# Patient Record
Sex: Male | Born: 1985 | Race: Black or African American | Hispanic: No | Marital: Single | Smoking: Former smoker
Health system: Southern US, Community
[De-identification: ages and names within clinical notes are randomized; demographics above are authoritative.]

## PROBLEM LIST (undated history)

## (undated) DIAGNOSIS — I499 Cardiac arrhythmia, unspecified: Secondary | ICD-10-CM

## (undated) DIAGNOSIS — I071 Rheumatic tricuspid insufficiency: Secondary | ICD-10-CM

## (undated) DIAGNOSIS — I4892 Unspecified atrial flutter: Secondary | ICD-10-CM

## (undated) DIAGNOSIS — I5022 Chronic systolic (congestive) heart failure: Secondary | ICD-10-CM

## (undated) DIAGNOSIS — I340 Nonrheumatic mitral (valve) insufficiency: Secondary | ICD-10-CM

## (undated) DIAGNOSIS — M4690 Unspecified inflammatory spondylopathy, site unspecified: Secondary | ICD-10-CM

## (undated) DIAGNOSIS — D8989 Other specified disorders involving the immune mechanism, not elsewhere classified: Secondary | ICD-10-CM

## (undated) DIAGNOSIS — I509 Heart failure, unspecified: Secondary | ICD-10-CM

## (undated) DIAGNOSIS — I050 Rheumatic mitral stenosis: Secondary | ICD-10-CM

## (undated) DIAGNOSIS — L88 Pyoderma gangrenosum: Secondary | ICD-10-CM

## (undated) DIAGNOSIS — I219 Acute myocardial infarction, unspecified: Secondary | ICD-10-CM

## (undated) DIAGNOSIS — I82409 Acute embolism and thrombosis of unspecified deep veins of unspecified lower extremity: Secondary | ICD-10-CM

## (undated) DIAGNOSIS — D649 Anemia, unspecified: Secondary | ICD-10-CM

## (undated) DIAGNOSIS — L732 Hidradenitis suppurativa: Secondary | ICD-10-CM

## (undated) HISTORY — DX: Rheumatic mitral stenosis: I05.0

## (undated) HISTORY — PX: CARDIAC CATHETERIZATION: SHX172

## (undated) HISTORY — DX: Acute myocardial infarction, unspecified: I21.9

## (undated) HISTORY — DX: Chronic systolic (congestive) heart failure: I50.22

## (undated) HISTORY — DX: Unspecified inflammatory spondylopathy, site unspecified: M46.90

## (undated) HISTORY — DX: Nonrheumatic mitral (valve) insufficiency: I34.0

## (undated) HISTORY — DX: Anemia, unspecified: D64.9

## (undated) HISTORY — DX: Unspecified atrial flutter: I48.92

## (undated) HISTORY — DX: Acute embolism and thrombosis of unspecified deep veins of unspecified lower extremity: I82.409

## (undated) HISTORY — DX: Rheumatic tricuspid insufficiency: I07.1

## (undated) HISTORY — DX: Pyoderma gangrenosum: L88

## (undated) HISTORY — DX: Other specified disorders involving the immune mechanism, not elsewhere classified: D89.89

---

## 2008-10-01 ENCOUNTER — Emergency Department (HOSPITAL_COMMUNITY): Admission: EM | Admit: 2008-10-01 | Discharge: 2008-10-01 | Payer: Self-pay | Admitting: Emergency Medicine

## 2011-12-03 ENCOUNTER — Non-Acute Institutional Stay (HOSPITAL_COMMUNITY): Admission: AD | Admit: 2011-12-03 | Payer: Self-pay | Source: Home / Self Care | Admitting: Internal Medicine

## 2014-03-07 ENCOUNTER — Other Ambulatory Visit: Payer: Self-pay | Admitting: Rheumatology

## 2014-03-07 ENCOUNTER — Ambulatory Visit
Admission: RE | Admit: 2014-03-07 | Discharge: 2014-03-07 | Disposition: A | Discharge status: Home / Self Care | Payer: BC Managed Care – PPO | Source: Ambulatory Visit | Attending: Rheumatology | Admitting: Rheumatology

## 2014-03-07 ENCOUNTER — Encounter (INDEPENDENT_AMBULATORY_CARE_PROVIDER_SITE_OTHER): Payer: Self-pay

## 2014-03-07 DIAGNOSIS — M549 Dorsalgia, unspecified: Secondary | ICD-10-CM

## 2014-03-07 DIAGNOSIS — M25562 Pain in left knee: Secondary | ICD-10-CM

## 2014-08-11 ENCOUNTER — Encounter (HOSPITAL_COMMUNITY): Payer: Self-pay | Admitting: *Deleted

## 2014-08-11 ENCOUNTER — Emergency Department (HOSPITAL_COMMUNITY)
Admission: EM | Admit: 2014-08-11 | Discharge: 2014-08-11 | Disposition: A | Discharge status: Home / Self Care | Payer: Self-pay | Attending: Emergency Medicine | Admitting: Emergency Medicine

## 2014-08-11 DIAGNOSIS — Z4801 Encounter for change or removal of surgical wound dressing: Secondary | ICD-10-CM | POA: Insufficient documentation

## 2014-08-11 DIAGNOSIS — Z5189 Encounter for other specified aftercare: Secondary | ICD-10-CM

## 2014-08-11 DIAGNOSIS — Z87891 Personal history of nicotine dependence: Secondary | ICD-10-CM | POA: Insufficient documentation

## 2014-08-11 NOTE — ED Provider Notes (Signed)
CSN: [id number]     Arrival date & time 08/11/14  1519 History   First MD Initiated Contact with Patient 08/11/14 1626     Chief Complaint  Patient presents with  . Leg Pain  . Post-op Problem     (Consider location/radiation/quality/duration/timing/severity/associated sxs/prior Treatment) Patient is a 29 y.o. male presenting with leg pain.  Leg Pain Associated symptoms: no back pain and no fever    patient with pain in his wounds. Recently had infections and had to have surgical drainage at [facility]. Over the last 3 days he's had a change in the drainage. Has been a little bit more of it and it was time screen. Reportedly after the wounds were cleaned they began to good again. No fevers. He is on amoxicillin after culture.   History reviewed. No pertinent past medical history. History reviewed. No pertinent past surgical history. History reviewed. No pertinent family history. History  Substance Use Topics  . Smoking status: Former Games developer  . Smokeless tobacco: Not on file  . Alcohol Use: No    Review of Systems  Constitutional: Negative for fever and chills.  Respiratory: Negative for shortness of breath.   Cardiovascular: Positive for leg swelling.  Musculoskeletal: Negative for back pain.  Skin: Positive for color change and wound.  Hematological: Does not bruise/bleed easily.      Allergies  Review of patient's allergies indicates no known allergies.  Home Medications   Prior to Admission medications   Not on File   BP 159/88 mmHg  Pulse 57  Temp(Src) 98.3 F (36.8 C) (Oral)  Resp 18  Ht 5\' 10"  (1.778 m)  Wt 235 lb (106.595 kg)  BMI 33.72 kg/m2  SpO2 100% Physical Exam  Constitutional: He appears well-developed.  Eyes: Pupils are equal, round, and reactive to light.  Cardiovascular: Normal rate and regular rhythm.   Pulmonary/Chest: Effort normal.  Abdominal: Soft.  Musculoskeletal: Normal range of motion.  Neurological: He is alert.  Skin:  Skin is warm.   healing wounds to left lower leg. Right anterior lower leg and right posterior lower leg. All have some granulation tear shoe that also is one in his superior gluteal cleft.      ED Course  Procedures (including critical care time) Labs Review Labs Reviewed - No data to display  Imaging Review No results found.   EKG Interpretation None      MDM   Final diagnoses:  Wound check, abscess   patient with healing leg wounds. Does not appear to be a bad infection at this time. Artery on antibiotics. Appears to be well treated at this time. Has follow-up appointment on the second. Will discharge home.   Juliet Rude. Rubin Payor, MD 08/11/14 1729

## 2014-08-11 NOTE — Discharge Instructions (Signed)

## 2014-08-11 NOTE — ED Notes (Signed)
Pt reports having surgery earlier this month at [facility] for multiple boils/abscesses. Pt reports green drainage and swelling to right lower leg, denies fever.

## 2014-11-30 ENCOUNTER — Encounter (HOSPITAL_BASED_OUTPATIENT_CLINIC_OR_DEPARTMENT_OTHER): Payer: Self-pay | Attending: Internal Medicine

## 2014-11-30 DIAGNOSIS — L02415 Cutaneous abscess of right lower limb: Secondary | ICD-10-CM | POA: Insufficient documentation

## 2014-11-30 DIAGNOSIS — L97211 Non-pressure chronic ulcer of right calf limited to breakdown of skin: Secondary | ICD-10-CM | POA: Insufficient documentation

## 2014-11-30 DIAGNOSIS — L0201 Cutaneous abscess of face: Secondary | ICD-10-CM | POA: Insufficient documentation

## 2014-11-30 DIAGNOSIS — L97221 Non-pressure chronic ulcer of left calf limited to breakdown of skin: Secondary | ICD-10-CM | POA: Insufficient documentation

## 2014-12-19 ENCOUNTER — Encounter (HOSPITAL_BASED_OUTPATIENT_CLINIC_OR_DEPARTMENT_OTHER): Payer: Self-pay | Attending: Internal Medicine

## 2014-12-19 DIAGNOSIS — L97211 Non-pressure chronic ulcer of right calf limited to breakdown of skin: Secondary | ICD-10-CM | POA: Insufficient documentation

## 2014-12-19 DIAGNOSIS — L02415 Cutaneous abscess of right lower limb: Secondary | ICD-10-CM | POA: Insufficient documentation

## 2014-12-19 DIAGNOSIS — L0201 Cutaneous abscess of face: Secondary | ICD-10-CM | POA: Insufficient documentation

## 2014-12-19 DIAGNOSIS — L97221 Non-pressure chronic ulcer of left calf limited to breakdown of skin: Secondary | ICD-10-CM | POA: Insufficient documentation

## 2015-01-09 ENCOUNTER — Other Ambulatory Visit (HOSPITAL_COMMUNITY)
Admission: RE | Admit: 2015-01-09 | Discharge: 2015-01-09 | Disposition: A | Discharge status: Home / Self Care | Payer: Self-pay | Source: Ambulatory Visit | Attending: General Surgery | Admitting: General Surgery

## 2015-01-09 DIAGNOSIS — L97221 Non-pressure chronic ulcer of left calf limited to breakdown of skin: Secondary | ICD-10-CM | POA: Insufficient documentation

## 2015-01-09 DIAGNOSIS — L97211 Non-pressure chronic ulcer of right calf limited to breakdown of skin: Secondary | ICD-10-CM | POA: Insufficient documentation

## 2015-01-09 DIAGNOSIS — L02415 Cutaneous abscess of right lower limb: Secondary | ICD-10-CM | POA: Insufficient documentation

## 2015-01-09 LAB — CBC WITH DIFFERENTIAL/PLATELET
Basophils Absolute: 0 10*3/uL (ref 0.0–0.1)
Basophils Relative: 0 % (ref 0–1)
EOS PCT: 0 % (ref 0–5)
Eosinophils Absolute: 0 10*3/uL (ref 0.0–0.7)
HCT: 30.2 % — ABNORMAL LOW (ref 39.0–52.0)
HEMOGLOBIN: 9.4 g/dL — AB (ref 13.0–17.0)
LYMPHS PCT: 33 % (ref 12–46)
Lymphs Abs: 3 10*3/uL (ref 0.7–4.0)
MCH: 23.4 pg — AB (ref 26.0–34.0)
MCHC: 31.1 g/dL (ref 30.0–36.0)
MCV: 75.3 fL — AB (ref 78.0–100.0)
MONO ABS: 0.3 10*3/uL (ref 0.1–1.0)
Monocytes Relative: 3 % (ref 3–12)
NEUTROS PCT: 64 % (ref 43–77)
Neutro Abs: 5.8 10*3/uL (ref 1.7–7.7)
PLATELETS: 634 10*3/uL — AB (ref 150–400)
RBC: 4.01 MIL/uL — AB (ref 4.22–5.81)
RDW: 20 % — ABNORMAL HIGH (ref 11.5–15.5)
WBC: 9.2 10*3/uL (ref 4.0–10.5)

## 2015-01-09 LAB — COMPREHENSIVE METABOLIC PANEL
ALT: 16 U/L — AB (ref 17–63)
AST: 33 U/L (ref 15–41)
Albumin: 2.9 g/dL — ABNORMAL LOW (ref 3.5–5.0)
Alkaline Phosphatase: 95 U/L (ref 38–126)
Anion gap: 6 (ref 5–15)
BUN: 10 mg/dL (ref 6–20)
CALCIUM: 8.7 mg/dL — AB (ref 8.9–10.3)
CHLORIDE: 101 mmol/L (ref 101–111)
CO2: 28 mmol/L (ref 22–32)
Creatinine, Ser: 0.75 mg/dL (ref 0.61–1.24)
GLUCOSE: 99 mg/dL (ref 65–99)
POTASSIUM: 3.7 mmol/L (ref 3.5–5.1)
Sodium: 135 mmol/L (ref 135–145)
Total Bilirubin: 0.3 mg/dL (ref 0.3–1.2)
Total Protein: 9.5 g/dL — ABNORMAL HIGH (ref 6.5–8.1)

## 2015-01-09 LAB — C-REACTIVE PROTEIN: CRP: 11.9 mg/dL — AB (ref <1.0)

## 2015-01-09 LAB — SEDIMENTATION RATE: Sed Rate: 103 mm/hr — ABNORMAL HIGH (ref 0–16)

## 2015-01-10 LAB — C4 COMPLEMENT: COMPLEMENT C4, BODY FLUID: 17 mg/dL (ref 14–44)

## 2015-01-10 LAB — ANA W/REFLEX: ANA: NEGATIVE

## 2015-01-10 LAB — C3 COMPLEMENT: C3 Complement: 170 mg/dL — ABNORMAL HIGH (ref 82–167)

## 2015-01-10 LAB — ANTI-DNA ANTIBODY, DOUBLE-STRANDED

## 2015-01-10 LAB — RHEUMATOID FACTOR: RHEUMATOID FACTOR: 22.1 [IU]/mL — AB (ref 0.0–13.9)

## 2015-01-22 ENCOUNTER — Encounter (HOSPITAL_BASED_OUTPATIENT_CLINIC_OR_DEPARTMENT_OTHER): Payer: Self-pay | Attending: Plastic Surgery

## 2015-01-22 DIAGNOSIS — L97811 Non-pressure chronic ulcer of other part of right lower leg limited to breakdown of skin: Secondary | ICD-10-CM | POA: Insufficient documentation

## 2015-01-22 DIAGNOSIS — L97821 Non-pressure chronic ulcer of other part of left lower leg limited to breakdown of skin: Secondary | ICD-10-CM | POA: Insufficient documentation

## 2015-01-29 ENCOUNTER — Other Ambulatory Visit: Payer: Self-pay | Admitting: Plastic Surgery

## 2015-01-30 ENCOUNTER — Telehealth (HOSPITAL_BASED_OUTPATIENT_CLINIC_OR_DEPARTMENT_OTHER): Payer: Self-pay | Admitting: *Deleted

## 2015-01-31 ENCOUNTER — Other Ambulatory Visit: Payer: Self-pay | Admitting: Plastic Surgery

## 2015-03-05 ENCOUNTER — Encounter (HOSPITAL_BASED_OUTPATIENT_CLINIC_OR_DEPARTMENT_OTHER): Payer: Self-pay | Attending: Plastic Surgery

## 2016-06-16 HISTORY — PX: HERNIA REPAIR: SHX51

## 2018-06-06 DIAGNOSIS — I469 Cardiac arrest, cause unspecified: Secondary | ICD-10-CM | POA: Diagnosis not present

## 2018-06-06 DIAGNOSIS — I512 Rupture of papillary muscle, not elsewhere classified: Secondary | ICD-10-CM | POA: Diagnosis not present

## 2018-06-06 DIAGNOSIS — J9601 Acute respiratory failure with hypoxia: Secondary | ICD-10-CM | POA: Diagnosis not present

## 2018-06-06 DIAGNOSIS — R042 Hemoptysis: Secondary | ICD-10-CM | POA: Diagnosis not present

## 2018-06-06 DIAGNOSIS — R634 Abnormal weight loss: Secondary | ICD-10-CM | POA: Diagnosis not present

## 2018-06-06 DIAGNOSIS — R918 Other nonspecific abnormal finding of lung field: Secondary | ICD-10-CM | POA: Diagnosis not present

## 2018-06-06 DIAGNOSIS — L97911 Non-pressure chronic ulcer of unspecified part of right lower leg limited to breakdown of skin: Secondary | ICD-10-CM | POA: Diagnosis not present

## 2018-06-06 DIAGNOSIS — I361 Nonrheumatic tricuspid (valve) insufficiency: Secondary | ICD-10-CM | POA: Diagnosis not present

## 2018-06-06 DIAGNOSIS — Z4682 Encounter for fitting and adjustment of non-vascular catheter: Secondary | ICD-10-CM | POA: Diagnosis not present

## 2018-06-06 DIAGNOSIS — I959 Hypotension, unspecified: Secondary | ICD-10-CM | POA: Diagnosis not present

## 2018-06-06 DIAGNOSIS — L88 Pyoderma gangrenosum: Secondary | ICD-10-CM | POA: Diagnosis not present

## 2018-06-06 DIAGNOSIS — J939 Pneumothorax, unspecified: Secondary | ICD-10-CM | POA: Diagnosis not present

## 2018-06-06 DIAGNOSIS — E861 Hypovolemia: Secondary | ICD-10-CM | POA: Diagnosis not present

## 2018-06-06 DIAGNOSIS — R0603 Acute respiratory distress: Secondary | ICD-10-CM | POA: Diagnosis not present

## 2018-06-06 DIAGNOSIS — R160 Hepatomegaly, not elsewhere classified: Secondary | ICD-10-CM | POA: Diagnosis not present

## 2018-06-06 DIAGNOSIS — R000 Tachycardia, unspecified: Secondary | ICD-10-CM | POA: Diagnosis not present

## 2018-06-06 DIAGNOSIS — J9690 Respiratory failure, unspecified, unspecified whether with hypoxia or hypercapnia: Secondary | ICD-10-CM | POA: Diagnosis not present

## 2018-06-06 DIAGNOSIS — F1210 Cannabis abuse, uncomplicated: Secondary | ICD-10-CM | POA: Diagnosis not present

## 2018-06-06 DIAGNOSIS — J810 Acute pulmonary edema: Secondary | ICD-10-CM | POA: Diagnosis not present

## 2018-06-06 DIAGNOSIS — J9600 Acute respiratory failure, unspecified whether with hypoxia or hypercapnia: Secondary | ICD-10-CM | POA: Diagnosis not present

## 2018-06-06 DIAGNOSIS — I358 Other nonrheumatic aortic valve disorders: Secondary | ICD-10-CM | POA: Diagnosis not present

## 2018-06-06 DIAGNOSIS — I9589 Other hypotension: Secondary | ICD-10-CM | POA: Diagnosis not present

## 2018-06-06 DIAGNOSIS — F17210 Nicotine dependence, cigarettes, uncomplicated: Secondary | ICD-10-CM | POA: Diagnosis not present

## 2018-06-06 DIAGNOSIS — R9431 Abnormal electrocardiogram [ECG] [EKG]: Secondary | ICD-10-CM | POA: Diagnosis not present

## 2018-06-06 DIAGNOSIS — L97921 Non-pressure chronic ulcer of unspecified part of left lower leg limited to breakdown of skin: Secondary | ICD-10-CM | POA: Diagnosis not present

## 2018-06-06 DIAGNOSIS — J90 Pleural effusion, not elsewhere classified: Secondary | ICD-10-CM | POA: Diagnosis not present

## 2018-06-06 DIAGNOSIS — R4182 Altered mental status, unspecified: Secondary | ICD-10-CM | POA: Diagnosis not present

## 2018-06-06 DIAGNOSIS — I340 Nonrheumatic mitral (valve) insufficiency: Secondary | ICD-10-CM | POA: Diagnosis not present

## 2018-06-06 DIAGNOSIS — Z954 Presence of other heart-valve replacement: Secondary | ICD-10-CM | POA: Diagnosis not present

## 2018-06-06 DIAGNOSIS — I517 Cardiomegaly: Secondary | ICD-10-CM | POA: Diagnosis not present

## 2018-06-06 DIAGNOSIS — D62 Acute posthemorrhagic anemia: Secondary | ICD-10-CM | POA: Diagnosis not present

## 2018-06-06 DIAGNOSIS — G8918 Other acute postprocedural pain: Secondary | ICD-10-CM | POA: Diagnosis not present

## 2018-06-06 DIAGNOSIS — R162 Hepatomegaly with splenomegaly, not elsewhere classified: Secondary | ICD-10-CM | POA: Diagnosis not present

## 2018-06-06 DIAGNOSIS — J95821 Acute postprocedural respiratory failure: Secondary | ICD-10-CM | POA: Diagnosis not present

## 2018-06-07 DIAGNOSIS — I340 Nonrheumatic mitral (valve) insufficiency: Secondary | ICD-10-CM | POA: Diagnosis not present

## 2018-06-07 DIAGNOSIS — Z954 Presence of other heart-valve replacement: Secondary | ICD-10-CM | POA: Diagnosis not present

## 2018-06-07 HISTORY — PX: MITRAL VALVE REPAIR: SHX2039

## 2018-06-08 DIAGNOSIS — Z954 Presence of other heart-valve replacement: Secondary | ICD-10-CM | POA: Diagnosis not present

## 2018-06-08 DIAGNOSIS — J939 Pneumothorax, unspecified: Secondary | ICD-10-CM | POA: Diagnosis not present

## 2018-06-08 DIAGNOSIS — I340 Nonrheumatic mitral (valve) insufficiency: Secondary | ICD-10-CM | POA: Diagnosis not present

## 2018-06-08 DIAGNOSIS — J90 Pleural effusion, not elsewhere classified: Secondary | ICD-10-CM | POA: Diagnosis not present

## 2018-06-10 DIAGNOSIS — J90 Pleural effusion, not elsewhere classified: Secondary | ICD-10-CM | POA: Diagnosis not present

## 2018-06-10 DIAGNOSIS — I340 Nonrheumatic mitral (valve) insufficiency: Secondary | ICD-10-CM | POA: Diagnosis not present

## 2018-06-10 DIAGNOSIS — J939 Pneumothorax, unspecified: Secondary | ICD-10-CM | POA: Diagnosis not present

## 2018-06-11 DIAGNOSIS — I340 Nonrheumatic mitral (valve) insufficiency: Secondary | ICD-10-CM | POA: Diagnosis not present

## 2018-06-17 DIAGNOSIS — D72829 Elevated white blood cell count, unspecified: Secondary | ICD-10-CM | POA: Diagnosis not present

## 2018-06-17 DIAGNOSIS — T8131XA Disruption of external operation (surgical) wound, not elsewhere classified, initial encounter: Secondary | ICD-10-CM | POA: Diagnosis not present

## 2018-06-17 DIAGNOSIS — L732 Hidradenitis suppurativa: Secondary | ICD-10-CM | POA: Diagnosis not present

## 2018-06-17 DIAGNOSIS — E43 Unspecified severe protein-calorie malnutrition: Secondary | ICD-10-CM | POA: Diagnosis not present

## 2018-06-17 DIAGNOSIS — R0789 Other chest pain: Secondary | ICD-10-CM | POA: Diagnosis not present

## 2018-06-17 DIAGNOSIS — L89159 Pressure ulcer of sacral region, unspecified stage: Secondary | ICD-10-CM | POA: Diagnosis not present

## 2018-06-17 DIAGNOSIS — Z87891 Personal history of nicotine dependence: Secondary | ICD-10-CM | POA: Diagnosis not present

## 2018-06-17 DIAGNOSIS — I808 Phlebitis and thrombophlebitis of other sites: Secondary | ICD-10-CM | POA: Diagnosis not present

## 2018-06-17 DIAGNOSIS — D62 Acute posthemorrhagic anemia: Secondary | ICD-10-CM | POA: Diagnosis not present

## 2018-06-17 DIAGNOSIS — R600 Localized edema: Secondary | ICD-10-CM | POA: Diagnosis not present

## 2018-06-17 DIAGNOSIS — I517 Cardiomegaly: Secondary | ICD-10-CM | POA: Diagnosis not present

## 2018-06-17 DIAGNOSIS — L97921 Non-pressure chronic ulcer of unspecified part of left lower leg limited to breakdown of skin: Secondary | ICD-10-CM | POA: Diagnosis not present

## 2018-06-17 DIAGNOSIS — I82622 Acute embolism and thrombosis of deep veins of left upper extremity: Secondary | ICD-10-CM | POA: Diagnosis not present

## 2018-06-17 DIAGNOSIS — I340 Nonrheumatic mitral (valve) insufficiency: Secondary | ICD-10-CM | POA: Diagnosis not present

## 2018-06-17 DIAGNOSIS — R9431 Abnormal electrocardiogram [ECG] [EKG]: Secondary | ICD-10-CM | POA: Diagnosis not present

## 2018-06-17 DIAGNOSIS — I319 Disease of pericardium, unspecified: Secondary | ICD-10-CM | POA: Diagnosis not present

## 2018-06-17 DIAGNOSIS — L88 Pyoderma gangrenosum: Secondary | ICD-10-CM | POA: Diagnosis not present

## 2018-06-17 DIAGNOSIS — Y929 Unspecified place or not applicable: Secondary | ICD-10-CM | POA: Diagnosis not present

## 2018-06-18 DIAGNOSIS — R600 Localized edema: Secondary | ICD-10-CM | POA: Diagnosis not present

## 2018-06-18 DIAGNOSIS — Z87891 Personal history of nicotine dependence: Secondary | ICD-10-CM | POA: Diagnosis not present

## 2018-06-18 DIAGNOSIS — I82622 Acute embolism and thrombosis of deep veins of left upper extremity: Secondary | ICD-10-CM | POA: Diagnosis not present

## 2018-06-18 DIAGNOSIS — R0789 Other chest pain: Secondary | ICD-10-CM | POA: Diagnosis not present

## 2018-06-18 DIAGNOSIS — I808 Phlebitis and thrombophlebitis of other sites: Secondary | ICD-10-CM | POA: Diagnosis not present

## 2018-06-18 DIAGNOSIS — L97921 Non-pressure chronic ulcer of unspecified part of left lower leg limited to breakdown of skin: Secondary | ICD-10-CM | POA: Diagnosis not present

## 2018-07-01 DIAGNOSIS — J9811 Atelectasis: Secondary | ICD-10-CM | POA: Diagnosis not present

## 2018-07-01 DIAGNOSIS — Z952 Presence of prosthetic heart valve: Secondary | ICD-10-CM | POA: Diagnosis not present

## 2018-07-01 DIAGNOSIS — L88 Pyoderma gangrenosum: Secondary | ICD-10-CM | POA: Diagnosis not present

## 2018-07-01 DIAGNOSIS — R0602 Shortness of breath: Secondary | ICD-10-CM | POA: Diagnosis not present

## 2018-07-01 DIAGNOSIS — Z5181 Encounter for therapeutic drug level monitoring: Secondary | ICD-10-CM | POA: Diagnosis not present

## 2018-07-01 DIAGNOSIS — I517 Cardiomegaly: Secondary | ICD-10-CM | POA: Diagnosis not present

## 2018-07-01 DIAGNOSIS — I340 Nonrheumatic mitral (valve) insufficiency: Secondary | ICD-10-CM | POA: Diagnosis not present

## 2018-07-01 DIAGNOSIS — R079 Chest pain, unspecified: Secondary | ICD-10-CM | POA: Diagnosis not present

## 2018-07-06 DIAGNOSIS — L88 Pyoderma gangrenosum: Secondary | ICD-10-CM | POA: Diagnosis not present

## 2018-07-06 DIAGNOSIS — L732 Hidradenitis suppurativa: Secondary | ICD-10-CM | POA: Diagnosis not present

## 2018-07-13 DIAGNOSIS — L88 Pyoderma gangrenosum: Secondary | ICD-10-CM | POA: Diagnosis not present

## 2018-07-20 DIAGNOSIS — Z5181 Encounter for therapeutic drug level monitoring: Secondary | ICD-10-CM | POA: Diagnosis not present

## 2018-07-20 DIAGNOSIS — L732 Hidradenitis suppurativa: Secondary | ICD-10-CM | POA: Diagnosis not present

## 2018-07-20 DIAGNOSIS — L88 Pyoderma gangrenosum: Secondary | ICD-10-CM | POA: Diagnosis not present

## 2018-07-20 DIAGNOSIS — L701 Acne conglobata: Secondary | ICD-10-CM | POA: Diagnosis not present

## 2018-07-26 DIAGNOSIS — R1011 Right upper quadrant pain: Secondary | ICD-10-CM | POA: Diagnosis not present

## 2018-07-26 DIAGNOSIS — R7989 Other specified abnormal findings of blood chemistry: Secondary | ICD-10-CM | POA: Diagnosis not present

## 2018-07-26 DIAGNOSIS — J984 Other disorders of lung: Secondary | ICD-10-CM | POA: Diagnosis not present

## 2018-07-26 DIAGNOSIS — R0789 Other chest pain: Secondary | ICD-10-CM | POA: Diagnosis not present

## 2018-07-26 DIAGNOSIS — R1013 Epigastric pain: Secondary | ICD-10-CM | POA: Diagnosis not present

## 2018-07-27 DIAGNOSIS — I5020 Unspecified systolic (congestive) heart failure: Secondary | ICD-10-CM | POA: Diagnosis not present

## 2018-07-27 DIAGNOSIS — L732 Hidradenitis suppurativa: Secondary | ICD-10-CM | POA: Diagnosis not present

## 2018-07-27 DIAGNOSIS — K6289 Other specified diseases of anus and rectum: Secondary | ICD-10-CM | POA: Diagnosis not present

## 2018-07-27 DIAGNOSIS — K648 Other hemorrhoids: Secondary | ICD-10-CM | POA: Diagnosis not present

## 2018-07-27 DIAGNOSIS — R079 Chest pain, unspecified: Secondary | ICD-10-CM | POA: Diagnosis not present

## 2018-07-27 DIAGNOSIS — R109 Unspecified abdominal pain: Secondary | ICD-10-CM | POA: Diagnosis not present

## 2018-07-27 DIAGNOSIS — R1011 Right upper quadrant pain: Secondary | ICD-10-CM | POA: Diagnosis not present

## 2018-07-27 DIAGNOSIS — R000 Tachycardia, unspecified: Secondary | ICD-10-CM | POA: Diagnosis not present

## 2018-07-27 DIAGNOSIS — R935 Abnormal findings on diagnostic imaging of other abdominal regions, including retroperitoneum: Secondary | ICD-10-CM | POA: Diagnosis not present

## 2018-07-27 DIAGNOSIS — R0609 Other forms of dyspnea: Secondary | ICD-10-CM | POA: Diagnosis not present

## 2018-07-27 DIAGNOSIS — Z8674 Personal history of sudden cardiac arrest: Secondary | ICD-10-CM | POA: Diagnosis not present

## 2018-07-27 DIAGNOSIS — I517 Cardiomegaly: Secondary | ICD-10-CM | POA: Diagnosis not present

## 2018-07-27 DIAGNOSIS — I252 Old myocardial infarction: Secondary | ICD-10-CM | POA: Diagnosis not present

## 2018-07-27 DIAGNOSIS — R1013 Epigastric pain: Secondary | ICD-10-CM | POA: Diagnosis not present

## 2018-07-27 DIAGNOSIS — D509 Iron deficiency anemia, unspecified: Secondary | ICD-10-CM | POA: Diagnosis not present

## 2018-07-27 DIAGNOSIS — Z95818 Presence of other cardiac implants and grafts: Secondary | ICD-10-CM | POA: Diagnosis not present

## 2018-07-27 DIAGNOSIS — J90 Pleural effusion, not elsewhere classified: Secondary | ICD-10-CM | POA: Diagnosis not present

## 2018-07-27 DIAGNOSIS — I82622 Acute embolism and thrombosis of deep veins of left upper extremity: Secondary | ICD-10-CM | POA: Diagnosis not present

## 2018-07-27 DIAGNOSIS — R0602 Shortness of breath: Secondary | ICD-10-CM | POA: Diagnosis not present

## 2018-07-27 DIAGNOSIS — I340 Nonrheumatic mitral (valve) insufficiency: Secondary | ICD-10-CM | POA: Diagnosis not present

## 2018-07-27 DIAGNOSIS — I509 Heart failure, unspecified: Secondary | ICD-10-CM | POA: Diagnosis not present

## 2018-07-27 DIAGNOSIS — K644 Residual hemorrhoidal skin tags: Secondary | ICD-10-CM | POA: Diagnosis not present

## 2018-07-27 DIAGNOSIS — L88 Pyoderma gangrenosum: Secondary | ICD-10-CM | POA: Diagnosis not present

## 2018-07-27 DIAGNOSIS — I5023 Acute on chronic systolic (congestive) heart failure: Secondary | ICD-10-CM | POA: Diagnosis not present

## 2018-07-27 DIAGNOSIS — M47899 Other spondylosis, site unspecified: Secondary | ICD-10-CM | POA: Diagnosis not present

## 2018-07-27 DIAGNOSIS — R011 Cardiac murmur, unspecified: Secondary | ICD-10-CM | POA: Diagnosis not present

## 2018-07-27 DIAGNOSIS — R0789 Other chest pain: Secondary | ICD-10-CM | POA: Diagnosis not present

## 2018-07-27 DIAGNOSIS — R112 Nausea with vomiting, unspecified: Secondary | ICD-10-CM | POA: Diagnosis not present

## 2018-07-27 DIAGNOSIS — J811 Chronic pulmonary edema: Secondary | ICD-10-CM | POA: Diagnosis not present

## 2018-07-27 DIAGNOSIS — Z952 Presence of prosthetic heart valve: Secondary | ICD-10-CM | POA: Diagnosis not present

## 2018-07-27 DIAGNOSIS — R7989 Other specified abnormal findings of blood chemistry: Secondary | ICD-10-CM | POA: Diagnosis not present

## 2018-07-27 DIAGNOSIS — D599 Acquired hemolytic anemia, unspecified: Secondary | ICD-10-CM | POA: Diagnosis not present

## 2018-07-27 DIAGNOSIS — Z87891 Personal history of nicotine dependence: Secondary | ICD-10-CM | POA: Diagnosis not present

## 2018-07-27 DIAGNOSIS — R072 Precordial pain: Secondary | ICD-10-CM | POA: Diagnosis not present

## 2018-07-27 DIAGNOSIS — I5189 Other ill-defined heart diseases: Secondary | ICD-10-CM | POA: Diagnosis not present

## 2018-07-27 DIAGNOSIS — I5021 Acute systolic (congestive) heart failure: Secondary | ICD-10-CM | POA: Diagnosis not present

## 2018-07-27 DIAGNOSIS — I808 Phlebitis and thrombophlebitis of other sites: Secondary | ICD-10-CM | POA: Diagnosis not present

## 2018-07-27 DIAGNOSIS — I080 Rheumatic disorders of both mitral and aortic valves: Secondary | ICD-10-CM | POA: Diagnosis not present

## 2018-07-27 DIAGNOSIS — J984 Other disorders of lung: Secondary | ICD-10-CM | POA: Diagnosis not present

## 2018-07-27 DIAGNOSIS — M461 Sacroiliitis, not elsewhere classified: Secondary | ICD-10-CM | POA: Diagnosis not present

## 2018-07-27 DIAGNOSIS — I429 Cardiomyopathy, unspecified: Secondary | ICD-10-CM | POA: Diagnosis not present

## 2018-07-27 DIAGNOSIS — R9431 Abnormal electrocardiogram [ECG] [EKG]: Secondary | ICD-10-CM | POA: Diagnosis not present

## 2018-07-27 DIAGNOSIS — E8809 Other disorders of plasma-protein metabolism, not elsewhere classified: Secondary | ICD-10-CM | POA: Diagnosis not present

## 2018-07-27 DIAGNOSIS — K5289 Other specified noninfective gastroenteritis and colitis: Secondary | ICD-10-CM | POA: Diagnosis not present

## 2018-07-27 DIAGNOSIS — Z8679 Personal history of other diseases of the circulatory system: Secondary | ICD-10-CM | POA: Diagnosis not present

## 2018-07-27 DIAGNOSIS — Z7901 Long term (current) use of anticoagulants: Secondary | ICD-10-CM | POA: Diagnosis not present

## 2018-07-27 DIAGNOSIS — R1031 Right lower quadrant pain: Secondary | ICD-10-CM | POA: Diagnosis not present

## 2018-08-05 DIAGNOSIS — R079 Chest pain, unspecified: Secondary | ICD-10-CM | POA: Diagnosis not present

## 2018-08-11 ENCOUNTER — Ambulatory Visit (INDEPENDENT_AMBULATORY_CARE_PROVIDER_SITE_OTHER): Payer: BLUE CROSS/BLUE SHIELD | Admitting: Cardiology

## 2018-08-11 ENCOUNTER — Encounter: Payer: Self-pay | Admitting: Cardiology

## 2018-08-11 VITALS — BP 114/79 | HR 68 | Ht 70.0 in | Wt 180.6 lb

## 2018-08-11 DIAGNOSIS — I5022 Chronic systolic (congestive) heart failure: Secondary | ICD-10-CM | POA: Diagnosis not present

## 2018-08-11 DIAGNOSIS — I340 Nonrheumatic mitral (valve) insufficiency: Secondary | ICD-10-CM | POA: Diagnosis not present

## 2018-08-11 MED ORDER — LISINOPRIL 10 MG PO TABS: 10.0000 mg | ORAL_TABLET | Freq: Every day | ORAL | 1 refills | Status: DC

## 2018-08-11 NOTE — Patient Instructions (Signed)
Your physician recommends that you schedule a follow-up appointment in: 1 MONTH WITH DR [facility]  Your physician has recommended you make the following change in your medication:   INCREASE LISINOPRIL 10 MG DAILY  Your physician recommends that you return for lab work in: 2 WEEKS - BMP   Thank you for choosing [facility]!!

## 2018-08-11 NOTE — Progress Notes (Signed)
Clinical Summary Donald [name] is a 33 y.o.male seen today as a new patient for the following medical problems.   1. Mitral regurgitation - admit 05/2018 with acute respiratory failure. From notes had cardaic arrest with induction for intubation.  - TTE done showed severe MR - TEE showed severe prolapse and possible flail A3 scallop with severe MR.   - 06/07/2018 went to OR for MV repair with resection of ruptured anterior papillary muscle, reconstruction of papillary chord with two Neo-chords and placement of Simplici-T Annuloplasty ring via superior septal approach - Intraop/Postop TEE reports no MR initially after surgery   07/2018 TEE during repeat admission mobile echodensity posterior leaflet, possible surgical suture, vs torn chordae vs vegetation. The MR is not reported regardign severity, ERO 0.93 would suggest severe, unclear  - CT surgery at that time recommended continued medical therapy, reconsider repeat intervention at ouptatient followup - thought perhaps an autoimmune disease may have causes some myxomatous degeneration. Undergoing rheum evaluation  -Has f/u 08/12/18 with [facility] CT surgery   - no SOB or DOE. No recent edema - compliant with meds.  2. Chronic systolic HF - readmitted 07/2018 with volume overload - multiple TTE's and TEEs as reported below. Last LVEF 35-40% by TTE in 07/2018, LVIDd 5.6 and LVIDs 4.3, mild LAE.   - discharged on Lasix  daily, lisinopril , TOprol , ASA 81, atorva 10,  - 08/03/18 labs Cr 0.81 K 3.9  - home weights stable 176 lbs. Limiting sodium intake.    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   4. 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   No Known Allergies   Current Outpatient Medications  Medication Sig Dispense Refill  . Adalimumab 40 MG/0.4ML PNKT Inject into the skin every 14 (fourteen) days.     . ASPIRIN ADULT LOW STRENGTH 81 MG EC tablet Take 81 mg by mouth daily.     Marland Kitchen atorvastatin (LIPITOR) 10 MG tablet Take 10 mg by mouth daily.     . clopidogrel (PLAVIX) 75 MG tablet Take 75 mg by mouth daily.    . ferrous sulfate 325 (65 FE) MG tablet Take 325 mg by mouth daily.     . folic acid (FOLVITE) 1 MG tablet Take 1 mg by mouth daily.     . furosemide (LASIX) 40 MG tablet Take 40 mg by mouth daily.     . metoprolol succinate (TOPROL-XL) 25 MG 24 hr tablet Take 25 mg by mouth daily.     . predniSONE (DELTASONE) 20 MG tablet TAKE 1 TABLET BY MOUTH ONCE DAILY FOR 5 DAYS THEN TAKE 1 2 (ONE HALF) TABLET ONCE DAILY FOR 5 DAYS    . rivaroxaban (XARELTO) 20 MG TABS tablet Take 20 mg by mouth daily with supper.     . vitamin C (ASCORBIC ACID) 250 MG tablet Take 250 mg by mouth 2 (two) times daily.     Marland Kitchen lisinopril (PRINIVIL,ZESTRIL) 10 MG tablet Take 1 tablet (10 mg total) by mouth daily. 90 tablet 1   No current facility-administered medications for this visit.  No past surgical history on file.   No Known Allergies    No family history on file.   Social History Donald [name] reports that he has quit smoking. He has never used smokeless tobacco. Donald [name] reports no history of alcohol use.   Review of Systems CONSTITUTIONAL: No weight loss, fever, chills, weakness or fatigue.  HEENT: Eyes: No visual loss, blurred vision, double vision or yellow sclerae.No hearing loss, sneezing, congestion, runny nose or sore throat.  SKIN: No rash or itching.  CARDIOVASCULAR: per hpi RESPIRATORY: No shortness of breath, cough or sputum.  GASTROINTESTINAL: No anorexia, nausea, vomiting or diarrhea. No abdominal pain or blood.  GENITOURINARY: No burning on  urination, no polyuria NEUROLOGICAL: No headache, dizziness, syncope, paralysis, ataxia, numbness or tingling in the extremities. No change in bowel or bladder control.  MUSCULOSKELETAL: No muscle, back pain, joint pain or stiffness.  LYMPHATICS: No enlarged nodes. No history of splenectomy.  PSYCHIATRIC: No history of depression or anxiety.  ENDOCRINOLOGIC: No reports of sweating, cold or heat intolerance. No polyuria or polydipsia.  Marland Kitchen   Physical Examination Vitals:   08/11/18 1110 08/11/18 1118  BP: 126/85 114/79  Pulse: 87 68  SpO2: 100%    Filed Weights   08/11/18 1110  Weight: 180 lb 9.6 oz (81.9 kg)    Gen: resting comfortably, no acute distress HEENT: no scleral icterus, pupils equal round and reactive, no palptable cervical adenopathy,  CV: RRR, 3/6 systolic murmur apex Resp: Clear to auscultation bilaterally GI: abdomen is soft, non-tender, non-distended, normal bowel sounds, no hepatosplenomegaly MSK: extremities are warm, no edema.  Skin: warm, no rash Neuro:  no focal deficits Psych: appropriate affect   Diagnostic Studies  06/06/18 Echo [facility] SUMMARY Mild left ventricular hypertrophy The left ventricle is mildly dilated. The apex is contracting normally. The rest of LV wall is hypokinetic. LV ejection fraction = 35-40%.  Left ventricular systolic function is moderately reduced. The right ventricle is normal in size and function. There is severe mitral regurgitation. There is mild mitral valve thickening. Suspect anterior mitral valve prolapse. Recommend TEE for further evaluation. Normal IVC size with decreased respiratory collapse. There is no pericardial effusion. There is no comparison study available.   06/06/2018 TEE [facility] SUMMARY There is severe prolapse (possible flail) of A3 scallop resulting in severe,  posteriorly directed regurgitation with a 0.5 cm gap in closure. Pulmonary  vein flow shows systolic reversal. Papillary muscles appear intact.  No  vegetations seen. The left ventricular size is normal.  Left ventricular systolic function is mildly reduced. The right ventricle is normal in size and function. Diffuse thickening of the aortic valve with preserved cusp opening. There is  an extremely small ( <2 mm) echodensity on the ventricular side of the aortic  valve--no significant aortic regurgitation; recommend clinical correlation. There is mild tricuspid regurgitation. No overt vegetations identified. 3D Echocardiography was performed and reviewed for assessment of cardiac  structure and function using a workstation. Findings were integrated into the  echo report.  06/07/18 Intraop TEE Pre-Intervention Summary An Omniplane TEE probe was inserted orally. A full TEE exam was performed  including 2D, M-Mode, Color and Doppler. The TEE probe was placed  atraumatically on the first attempt. The left ventricle is severely dilated. There is normal left ventricular wall  thickness. Left ventricular systolic function is moderately reduced. The EF  is 30-35%. There is mild to moderate global hypokinesis of the left  ventricle. There is moderate to severe inferior wall hypokinesis. The  right ventricle is moderate to severely dilated. The right ventricular  systolic function is mildly reduced. The interatrial septum is intact with no evidence for an atrial septal  defect. The left atrium is severely dilated. Right atrial size is normal. There is a ruptured chordae with flail of the A3 segment of the anterior  mitral leaflet. No significant mitral valve stenosis. There is severe mitral  regurgitation. Flow reversal noted in pulmonary veins consistent with  significant mitral regurgitation. The mitral regurgitant jet is posteriorly  directed, which is consistent with anterior leaflet pathology. The tricuspid valve is normal in structure and function. There is trace  tricuspid regurgitation. The aortic valve is trileaflet. The  aortic valve opens well. There is a small  irregularity on the left coronary cusp adjacent to the right coronary cusp.  Cannot exclude aortic valvular vegetation. No hemodynamically significant  valvular aortic stenosis. Trace to mild AI with central jet. The pulmonic valve is normal in structure and function. > The aortic root is normal size. No obvious dissection could be visualized.  There is no pericardial effusion. There is no pleural effusion.  Post-Intervention Summary The EF remains 30-35%. There is moderate to severe inferior wall hypokinesis. The right ventricular systolic function is normal. The interatrial septum is intact with no evidence for an atrial septal defect. There is no mitral regurgitation noted. An annuloplasty ring is noted in the  mitral position. There is trace tricuspid regurgitation. Trace to mild AI. The pulmonic valve is not well visualized. No obvious dissection could be visualized. There is no pericardial effusion.  There is no pleural effusion. MMode/2D Measurements & Calculations EDV(MOD-sp4): 72.3 ml ESV(MOD-sp4): 117.0 ml EDV(MOD-sp2): 257.0 ml ESV(MOD-sp2): 150.0 ml SV(MOD-sp4): -44.7 ml Pre-Intervention Doppler Measurements and Calculations MV max PG: 11.1 mmHg MV mean PG: 4.8 mmHg Ao V2 max: 87.0 cm/sec Ao max PG: 3.0 mmHg Ao V2 mean: 51.9 cm/sec Ao mean PG: 1.4 mmHg Ao V2 VTI: 13.4 cm   Overall TEE Interpretation Summary Pre-intervention exam: The LV is severey dilated with mild to moderate global  hypokinesis. The EF is 30-35%. There is moderate to severe hypokinesis of the  inferior wall. The RV systolic function is mildly reduced. There is a  ruptured chordae and flail of the A3 segment of the anterior mitral leaflet.  The posterior leaflet appears normal. Severe MR with posteriorly directed jet  and systolic flow reversal in the pulmonary veins. There is a small echodense  irregularity on the left coronary cusp of the aortic valve  adjacent to the  right coronary cusp. Vegetation cannot be ruled out. There is trace to mild  AI with a central jet. There is trace TR. There is no obvious aortic  dissection. There is no pericardial effusion. There is no pleural effusion. Post-intervention exam: The LV EF remains unchanged from the pre-intervention  exam. The RV systolic function has improved and is now normal. There is an  annuloplasty ring in the mitral position. There is no MR. Otherwise the exam  is unchanged from the pre-intervention exam. At the conclusion of the  procedure the probe was removed. There were no apparant complications related  to the TEE examination.   Jun 18 2018 echo SUMMARY There is normal left ventricular wall thickness. The left ventricle is mildly dilated.  Left ventricular systolic function is mildly reduced. LV ejection fraction = 40-45%. Abnormal (paradoxical) septal motion consistent with post-operative status. There is hypokinesis of the basal to mid inferior wall. The right ventricle is normal  in size and function. The left atrial size is normal. An annuloplasty ring is noted in the mitral position. The mean gradient across the mitral valve is 7.6 mmHg. There is no mitral regurgitation noted. The aortic sinus is normal in size. IVC size was normal. There is no pericardial effusion. Compared to the last surface echo dated 06/06/18, the LV systolic function is  slightly improved and the patient is s/p mitral valve repair.  Jul 28 2018 TTE SUMMARY The left ventricle is mildly dilated.  Left ventricular systolic function is moderately reduced. LV ejection fraction = 35-40%. The right ventricle is normal size. The right ventricular systolic function is mildly reduced. The left atrium is mildly dilated. There is mild aortic regurgitation. Status post mitral valve repair with annuloplasty ring There is mild to moderate mitral regurgitation. The mean gradient across the mitral  valve is 9.8 mmHg. The heart rate for the mean mitral valve gradient is 92 BPM. There is mild tricuspid regurgitation. There was insufficient TR detected to calculate RV systolic pressure. Estimated right atrial pressure is 10 mmHg.Marland Kitchen There is no pericardial effusion.   Jul 28 2018 TEE - SUMMARY s/p Mitral valve repair with resection of ruptured anterior papillary muscle,  reconstruction of papillary chord with two Neo-chords and placement of  Simplici-T Annuloplasty ring. Noted a mobile echodensity on the posterior leaflet of the mitral valve,  which may represents surgical suture. Differential include torn mitral valve  chordae or vegetation. Compared to the post-op TEE, MR appears worse.  Clinical correlation advised. There is mild aortic regurgitation. A mobile echodensity noted on the left coronary cusp of the aortic valve  which was seen in the previous TEEs. Noted mild eccentric AI.   06/07/18 surgery note  PREOPERATIVE DIAGNOSIS: ICD-10 I34.0 Mitral valve insufficiency  POSTOPERATIVE DIAGNOSIS: same  QUALITY MEASURES: 4047F Documentation of order for prophylactic antibiotics to be given within one hour (if fluoroquinolone or vancomycin, two hours) prior to surgical incision (or start of procedure when no incision is required) 4041F Documentation of order for cefazolin or cefuroxime for antimicrobial prophylaxis  HEMODYNAMICS AND CATH: Ejection fraction: 35%  VALVE DATA: Mitral Valve: Severe Insufficiency  PRIORITY: Emergent  INCIDENCE: First cardiovascular surgery   PROCEDURE: CPT Codes: 42876 * Mitral valve repair with resection of ruptured anterior papillary muscle, reconstruction of papillary chord with two Neo-chords and placement of Simplici-T Annuloplasty ring via superior septal approach Model: 670 Serial: [id number]  CARDIOPULMONARY BYPASS TIME: 131 min  AORTIC CROSS CLAMP TIME: 92 min  DESCRIPTION OF OPERATION After induction of anesthesia, the  patient was prepped and draped. Before the incision was made, time-out was observed by all members of the surgical and anesthesia teams to identify the correct patient and procedure. The heart was exposed via a full conventional sternotomy. The anatomy of the heart and great vessels was normal. On palpation the aorta was normal.   Intraoperative transesophageal echocardiography showed the left ventricular function to be hypokinetic with an ejection fraction of 35%. Exam of the cardiac valves showed severe mitral regurgitation with anterior A2/A3 leaflet prolapse.  After heparinization, cardiopulmonary bypass was instituted. Arterial perfusion was via an arterial cannula placed in the aorta. For assisted venous return, drainage was from two cannuli placed through the right atrium into the superior and inferior vena cavi. Caval tapes were placed. The patient was cooled to a body core temperature of 36 degrees centigrade. The left ventricle was vented through the both aorta and right superior pulmonary vein.  The ascending aorta  was cross-clamped and the heart was arrested with cold DelNido cardioplegia given antegrade. Once dose was adequate for the aforementioned cross clamp time. Topical cooling was used.  The mitral valve was exposed via a superior septal approach incision. The left atrium was carefully examined. There was no clot present. Retraction sutures were placed and the valve was well visualized. The valve was bicuspid and was diseased from an apparent degenerative etiology. The calcification of the leaflets was none and no calcification of the annulus. The valve was inspected and the defect found to be ruptured anterior papillary muscle causing the A2/A3 prolapse. The anterior leaflet was thickened. There were no vegetations or stigmata of endocarditis. The valve was amenable to repair.  The ruptured papillary muscle was resected. The chord was then reconstructed utilizing two 5-0 Neo-chords. The  valve was statically tested and was found to be competent without leak.  Thirteen sutures of 2-0 dacron were placed in simple mattress fashion in the annulus from trigone to trigone posteriorly. A Medtronic Simplici T band was chosen and the sutures passed through the band. The band was seated and the sutures secured via CorKnots. The valve was again statically tested and was found to have a small leak at the medial commissure. A commisuroplasty was performed utilizing 5-0 was performed. The valve then appeared to be hydrostatic.   The left ventricular vent was placed across the mitral valve. The left atrial and interatrial septal portions of the incisions were closed with running 4-0 Prolene. An aortic vent was placed to low suction and the cross clamp released. The right atrial portion of the incision was closed with a 4-0 Prolene suture. Caval tapes were released.  The patient was rewarmed. Cardioversion occurred spontaneously. The underlying rhythm was normal sinus. Atrial and ventricular pacing was used. The patient was weaned and separated from cardiopulmonary bypass. After discontinuation of cardiopulmonary bypass the heart adequately supported the circulation. Inotropes were used upon leaving the operating room. The left ventricular function was moderatly hypokinetic. The right ventricular function was mildly hypokinetic. Post procedure intraoperative transesophageal echocardiogram demonstrated an ejection fraction of 35% and there was well-seated annuloplasty ring with no mitral regurgitation.  The bypass lines were removed and the Heparin reversed with Protamine. The pericardium was drained with one 24 Fr. Blake drain. The pleural space did not require a chest tube. The sternum was reapproximated with #7 and double stranded stainless steel wires. The linea alba was closed with #2 absorbable sutures. The pectoralis fascia was closed with #0 absorbable sutures. Subcutaneous tissues were closed with  2-0 absorbable suture. The skin was closed with 3-0 absorbable subcuticular suture. Sterile dressings were placed over the wounds.  There were no complications and sponge, instrument, and needle counts were reported as correct. The patient was transported to the Intensive Care Unit in stable condition.     Assessment and Plan   1. Mitral regurgitation - complex history as described above with recent repair, appears to have recurrent MR. From recent TEE report can't tell how bad MR is, only that its worst from postop TEE. We will request images - he appears euvolemic, continue current diuretic - has f/u with CT surgery at [facility] this week to reevaluate possible MVR  2. Chronic systolic HF - LVEF 35-40% by last echo - he is euvolemic currenrtly, no significant symptoms - increase lisinopril to  daily. If tolerates regimen over time likely transition to entresto  3. Brachial vein DVT - continue xarelto   On our med list  reported as being on ASA, plavix, and xarelto. I am confused about the plavix and will review records further and also touch base with his pharmacy about this. Most recent discahrge summary does not report plavix.    Extensive multiople hospital admission records reviewed including H&Ps, consult notes, progress notes, lab results, imaging results. Extended 45 minute visit today.   [name], M.D.

## 2018-08-12 ENCOUNTER — Encounter: Payer: Self-pay | Admitting: Cardiology

## 2018-08-16 ENCOUNTER — Telehealth: Payer: Self-pay | Admitting: Cardiology

## 2018-08-16 NOTE — Progress Notes (Addendum)
Spoke with Dr Emelda Fear CT surgery at [facility]. Ideal would be to allow additional 3 month recovery from recent surgery, and then likely MVR for recurrent MR after recent repair. We will follow him closely medically, if worsening symptoms may have to adjust ideal time frame, as of our last visit he was euvolemic and feeling well. We are awaiting images, from our discussion appears most recent TEE at [facility] showed moderate MR.    Dominga Ferry MD

## 2018-08-16 NOTE — Telephone Encounter (Signed)
Pt states weight is 180 lbs, same as at office visit, no leg swelling. I asked him if he has been drinking less fluids, he states no

## 2018-08-16 NOTE — Telephone Encounter (Signed)
Patient states that he is on "fluid medication" but has not been producing much fluid past couple days. Please advise if he needs to up dosage. / tg

## 2018-08-16 NOTE — Telephone Encounter (Signed)
Received telephone call from Dr. Jennye Moccasin - surgeon with [facility]. He is requesting to speak with Dr. Wyline Mood in regards to mutual patient.  Please call his cell # [phone].

## 2018-08-17 ENCOUNTER — Telehealth: Payer: Self-pay | Admitting: *Deleted

## 2018-08-17 NOTE — Telephone Encounter (Signed)
-----   Message from Lesle Chris, LPN sent at 4/69/6295  5:30 PM EST -----  ----- Message ----- From: [name], MD Sent: 08/12/2018   8:59 AM EST To: Lesle Chris, LPN  Can we check with patient and with his pharmacy if he is taking clopidogrel. We had it on his med list yesterday. I looked over the discharge summary at [facility] from 2/17 and dont see anything about being on clopidogrel, please update me    Dina Rich MD

## 2018-08-17 NOTE — Telephone Encounter (Signed)
As long as weights are stable, no significant edema, and breathing is ok then would continue same dose. Essentially he is at his dry weight, so he will not be putting out as much fluid as he originally was when he had too much fluid, essentially we want his intake and output to be balanced at this time. If gains 3 pounds or more he is to call us right away   Dominga Ferry MD

## 2018-08-17 NOTE — Telephone Encounter (Signed)
Patient confirmed that he is taking clopidogrel 75 mg by mouth daily.

## 2018-08-17 NOTE — Telephone Encounter (Signed)
From: [name], MD  Sent: 08/12/2018  8:59 AM EST  To: Lesle Chris, LPN   Can we check with patient and with his pharmacy if he is taking clopidogrel. We had it on his med list yesterday. I looked over the discharge summary at [facility] from 2/17 and dont see anything about being on clopidogrel, please update me     Dina Rich MD

## 2018-08-19 NOTE — Telephone Encounter (Signed)
Patient understood Dr.Branch's explanation.He will call if he gais 3 lbs or more over night

## 2018-08-30 ENCOUNTER — Ambulatory Visit: Payer: Self-pay | Admitting: Cardiology

## 2018-09-01 ENCOUNTER — Ambulatory Visit: Payer: Self-pay | Admitting: Cardiology

## 2018-09-01 DIAGNOSIS — L88 Pyoderma gangrenosum: Secondary | ICD-10-CM | POA: Diagnosis not present

## 2018-09-01 DIAGNOSIS — L732 Hidradenitis suppurativa: Secondary | ICD-10-CM | POA: Diagnosis not present

## 2018-09-01 DIAGNOSIS — Z5181 Encounter for therapeutic drug level monitoring: Secondary | ICD-10-CM | POA: Diagnosis not present

## 2018-09-06 ENCOUNTER — Encounter (HOSPITAL_COMMUNITY): Payer: Self-pay

## 2018-09-16 DIAGNOSIS — M459 Ankylosing spondylitis of unspecified sites in spine: Secondary | ICD-10-CM | POA: Diagnosis not present

## 2018-09-16 DIAGNOSIS — Z79899 Other long term (current) drug therapy: Secondary | ICD-10-CM | POA: Diagnosis not present

## 2018-09-16 DIAGNOSIS — L88 Pyoderma gangrenosum: Secondary | ICD-10-CM | POA: Diagnosis not present

## 2018-09-17 ENCOUNTER — Telehealth: Payer: Self-pay | Admitting: Cardiology

## 2018-09-17 NOTE — Telephone Encounter (Signed)
° °  COVID-19 Pre-Screening Questions: ° °• Do you currently have a fever? °•  °• Have you recently travelled on a cruise, internationally, or to [place], [place], [place], [place], [place], or [place], [place] ([place]) ?  °•  °• Have you been in contact with someone that is currently pending confirmation of Covid19 testing or has been confirmed to have the Covid19 virus?  °•  °Are you currently experiencing fatigue or cough?  ° °

## 2018-09-20 ENCOUNTER — Telehealth: Payer: Self-pay | Admitting: Cardiology

## 2018-09-20 ENCOUNTER — Other Ambulatory Visit: Payer: Self-pay

## 2018-09-20 NOTE — Progress Notes (Unsigned)
{Choose 1 Note Type (Telehealth Visit or Telephone Visit):[phone]}  Evaluation Performed:  Follow-up visit  This visit type was conducted due to national recommendations for restrictions regarding the COVID-19 Pandemic (e.g. social distancing).  This format is felt to be most appropriate for this patient at this time.  All issues noted in this document were discussed and addressed.  Due to his comorbid illnesses, this patient is felt to be at least at moderate risk without adequate follow up.  No physical exam was performed (except for noted visual exam findings with Video Visits).  Please refer to the patient's chart (MyChart message for video visits and phone note for telephone visits) for the patient's consent to telehealth for [facility].  Date:  09/20/2018   ID:  Donald [name], DOB [birth year], MRN [id number]  {Patient Location:[phone]::"Home"}  {Provider Location:[phone]}  PCP:  Patient, No Pcp Per  Cardiologist:  No primary care provider on file. *** Electrophysiologist:  None   Chief Complaint:  ***  History of Present Illness:    Donald [name] is a 33 y.o. male who presents via audio/video conferencing for a telehealth visit today.    1. Mitral regurgitation - admit 05/2018 with acute respiratory failure. From notes had cardaic arrest with induction for intubation.  - TTE done showed severe MR - TEE showed severe prolapse and possible flail A3 scallop with severe MR.   - 06/07/2018 went to OR for MV repair with resection of ruptured anterior papillary muscle, reconstruction of papillary chord with two Neo-chords and placement of Simplici-T Annuloplasty ring via superior septal approach - Intraop/Postop TEE reports no MR initially after surgery   07/2018 TEE during repeat admission mobile echodensity posterior leaflet, possible surgical suture, vs torn chordae vs vegetation. The MR is not reported regardign severity, ERO 0.93 would suggest severe,  unclear  - CT surgery at that time recommended continued medical therapy, reconsider repeat intervention at ouptatient followup - thought perhaps an autoimmune disease may have causes some myxomatous degeneration. Undergoing rheum evaluation  -Has f/u 08/12/18 with [facility] CT surgery   - no SOB or DOE. No recent edema - compliant with meds.  2. Chronic systolic HF - readmitted 07/2018 with volume overload - multiple TTE's and TEEs as reported below. Last LVEF 35-40% by TTE in 07/2018, LVIDd 5.6 and LVIDs 4.3, mild LAE.   - discharged on Lasix  daily, lisinopril , TOprol , ASA 81, atorva 10,  - 08/03/18 labs Cr 0.81 K 3.9  - home weights stable 176 lbs. Limiting sodium intake.    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   4. 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    No past medical history on file. No past surgical history on file.   No outpatient medications have been marked as taking for the 09/20/18 encounter (Appointment) with [name], MD.     Allergies:   Patient has no known allergies.   Social History   Tobacco Use  . Smoking status: Former Games developer  . Smokeless tobacco: Never Used  Substance Use Topics  . Alcohol use: No  . Drug use: No     Family Hx: The patient's family history is not on file.  ROS:   Please see the history of present illness.    *** All other systems reviewed and are negative.   Prior CV studies:   The following studies were reviewed today:  06/06/18 Echo  [facility] SUMMARY Mild left ventricular hypertrophy The left ventricle is mildly dilated. The apex is contracting normally. The rest of LV wall is hypokinetic. LV ejection fraction = 35-40%.  Left ventricular systolic function is moderately reduced. The right ventricle is normal in size and function. There is severe mitral regurgitation. There is mild mitral valve thickening. Suspect anterior mitral valve prolapse. Recommend TEE for further evaluation. Normal IVC size with decreased respiratory collapse. There is no pericardial effusion. There is no comparison study available.   06/06/2018 TEE [facility] SUMMARY There is severe prolapse (possible flail) of A3 scallop resulting in severe,  posteriorly directed regurgitation with a 0.5 cm gap in closure. Pulmonary  vein flow shows systolic reversal. Papillary muscles appear intact. No  vegetations seen. The left ventricular size is normal.  Left ventricular systolic function is mildly reduced. The right ventricle is normal in size and function. Diffuse thickening of the aortic valve with preserved cusp opening. There is  an extremely small ( <2 mm) echodensity on the ventricular side of the aortic  valve--no significant aortic regurgitation; recommend clinical correlation. There is mild tricuspid regurgitation. No overt vegetations identified. 3D Echocardiography was performed and reviewed for assessment of cardiac  structure and function using a workstation. Findings were integrated into the  echo report.  06/07/18 Intraop TEE Pre-Intervention Summary An Omniplane TEE probe was inserted orally. A full TEE exam was performed  including 2D, M-Mode, Color and Doppler. The TEE probe was placed  atraumatically on the first attempt. The left ventricle is severely dilated. There is normal left ventricular wall  thickness. Left ventricular systolic function is moderately reduced. The EF  is 30-35%. There is mild to moderate global hypokinesis  of the left  ventricle. There is moderate to severe inferior wall hypokinesis. The right ventricle is moderate to severely dilated. The right ventricular  systolic function is mildly reduced. The interatrial septum is intact with no evidence for an atrial septal  defect. The left atrium is severely dilated. Right atrial size is normal. There is a ruptured chordae with flail of the A3 segment of the anterior  mitral leaflet. No significant mitral valve stenosis. There is severe mitral  regurgitation. Flow reversal noted in pulmonary veins consistent with  significant mitral regurgitation. The mitral regurgitant jet is posteriorly  directed, which is consistent with anterior leaflet pathology. The tricuspid valve is normal in structure and function. There is trace  tricuspid regurgitation. The aortic valve is trileaflet. The aortic valve opens well. There is a small  irregularity on the left coronary cusp adjacent to the right coronary cusp.  Cannot exclude aortic valvular vegetation. No hemodynamically significant  valvular aortic stenosis. Trace to mild AI with central jet. The pulmonic valve is normal in structure and function. > The aortic root is normal size. No obvious dissection could be visualized.  There is no pericardial effusion. There is no pleural effusion.  Post-Intervention Summary The EF remains 30-35%. There is moderate to severe inferior wall hypokinesis. The right ventricular systolic function is normal. The interatrial septum is intact with no evidence for an atrial septal defect. There is  no mitral regurgitation noted. An annuloplasty ring is noted in the  mitral position. There is trace tricuspid regurgitation. Trace to mild AI. The pulmonic valve is not well visualized. No obvious dissection could be visualized. There is no pericardial effusion.  There is no pleural effusion. MMode/2D Measurements & Calculations EDV(MOD-sp4): 72.3 ml ESV(MOD-sp4): 117.0  ml EDV(MOD-sp2): 257.0 ml ESV(MOD-sp2): 150.0 ml SV(MOD-sp4): -44.7 ml Pre-Intervention Doppler Measurements and Calculations MV max PG: 11.1 mmHg MV mean PG: 4.8 mmHg Ao V2 max: 87.0 cm/sec Ao max PG: 3.0 mmHg Ao V2 mean: 51.9 cm/sec Ao mean PG: 1.4 mmHg Ao V2 VTI: 13.4 cm   Overall TEE Interpretation Summary Pre-intervention exam: The LV is severey dilated with mild to moderate global  hypokinesis. The EF is 30-35%. There is moderate to severe hypokinesis of the  inferior wall. The RV systolic function is mildly reduced. There is a  ruptured chordae and flail of the A3 segment of the anterior mitral leaflet.  The posterior leaflet appears normal. Severe MR with posteriorly directed jet  and systolic flow reversal in the pulmonary veins. There is a small echodense  irregularity on the left coronary cusp of the aortic valve adjacent to the  right coronary cusp. Vegetation cannot be ruled out. There is trace to mild  AI with a central jet. There is trace TR. There is no obvious aortic  dissection. There is no pericardial effusion. There is no pleural effusion. Post-intervention exam: The LV EF remains unchanged from the pre-intervention  exam. The RV systolic function has improved and is now normal. There is an  annuloplasty ring in the mitral position. There is no MR. Otherwise the exam  is unchanged from the pre-intervention exam. At the conclusion of the  procedure the probe was removed. There were no apparant complications related  to the TEE examination.   Jun 18 2018 echo SUMMARY There is normal left ventricular wall thickness. The left ventricle is mildly dilated.  Left ventricular systolic function is mildly reduced. LV ejection fraction = 40-45%. Abnormal (paradoxical) septal motion consistent with post-operative status. There is hypokinesis of the basal to mid inferior wall. The right ventricle is normal in size and function. The left atrial size is normal. An  annuloplasty ring is noted in the mitral position. The mean gradient across the mitral valve is 7.6 mmHg. There is no mitral regurgitation noted. The aortic sinus is normal in size. IVC size was normal. There is no pericardial effusion. Compared to the last surface echo dated 06/06/18, the LV systolic function is  slightly improved and the patient is s/p mitral valve repair.  Jul 28 2018 TTE SUMMARY The left ventricle is mildly dilated.  Left ventricular systolic function is moderately reduced. LV ejection fraction = 35-40%. The right ventricle is normal size. The right ventricular systolic function is mildly reduced. The left atrium is mildly dilated. There is mild aortic regurgitation. Status post mitral valve repair with annuloplasty ring There is mild to moderate mitral regurgitation. The mean gradient across the mitral valve is 9.8 mmHg. The heart rate for the mean mitral valve gradient is 92 BPM. There is mild tricuspid regurgitation. There was insufficient TR detected to calculate RV systolic pressure. Estimated right atrial pressure is 10 mmHg.Marland Kitchen There is no pericardial effusion.   Jul 28 2018 TEE - SUMMARY s/p Mitral valve repair with resection of ruptured anterior papillary muscle,  reconstruction of papillary chord with two Neo-chords and placement of  Simplici-T Annuloplasty ring. Noted a mobile echodensity on  the posterior leaflet of the mitral valve,  which may represents surgical suture. Differential include torn mitral valve  chordae or vegetation. Compared to the post-op TEE, MR appears worse.  Clinical correlation advised. There is mild aortic regurgitation. A mobile echodensity noted on the left coronary cusp of the aortic valve  which was seen in the previous TEEs. Noted mild eccentric AI.   06/07/18 surgery note  PREOPERATIVE DIAGNOSIS: ICD-10 I34.0 Mitral valve insufficiency  POSTOPERATIVE DIAGNOSIS: same  QUALITY MEASURES: 4047F  Documentation of order for prophylactic antibiotics to be given within one hour (if fluoroquinolone or vancomycin, two hours) prior to surgical incision (or start of procedure when no incision is required) 4041F Documentation of order for cefazolin or cefuroxime for antimicrobial prophylaxis  HEMODYNAMICS AND CATH: Ejection fraction: 35%  VALVE DATA: Mitral Valve: Severe Insufficiency  PRIORITY: Emergent  INCIDENCE: First cardiovascular surgery   PROCEDURE: CPT Codes: 16109 * Mitral valve repair with resection of ruptured anterior papillary muscle, reconstruction of papillary chord with two Neo-chords and placement of Simplici-T Annuloplasty ring via superior septal approach Model: 670 Serial: [id number]  CARDIOPULMONARY BYPASS TIME: 131 min  AORTIC CROSS CLAMP TIME: 92 min  DESCRIPTION OF OPERATION After induction of anesthesia, the patient was prepped and draped. Before the incision was made, time-out was observed by all members of the surgical and anesthesia teams to identify the correct patient and procedure. The heart was exposed via a full conventional sternotomy. The anatomy of the heart and great vessels was normal. On palpation the aorta was normal.   Intraoperative transesophageal echocardiography showed the left ventricular function to be hypokinetic with an ejection fraction of 35%. Exam of the cardiac valves showed severe mitral regurgitation with anterior A2/A3 leaflet prolapse.  After heparinization, cardiopulmonary bypass was instituted. Arterial perfusion was via an arterial cannula placed in the aorta. For assisted venous return, drainage was from two cannuli placed through the right atrium into the superior and inferior vena cavi. Caval tapes were placed. The patient was cooled to a body core temperature of 36 degrees centigrade. The left ventricle was vented through the both aorta and right superior pulmonary vein.  The ascending aorta was cross-clamped and the heart  was arrested with cold DelNido cardioplegia given antegrade. Once dose was adequate for the aforementioned cross clamp time. Topical cooling was used.  The mitral valve was exposed via a superior septal approach incision. The left atrium was carefully examined. There was no clot present. Retraction sutures were placed and the valve was well visualized. The valve was bicuspid and was diseased from an apparent degenerative etiology. The calcification of the leaflets was none and no calcification of the annulus. The valve was inspected and the defect found to be ruptured anterior papillary muscle causing the A2/A3 prolapse. The anterior leaflet was thickened. There were no vegetations or stigmata of endocarditis. The valve was amenable to repair.  The ruptured papillary muscle was resected. The chord was then reconstructed utilizing two 5-0 Neo-chords. The valve was statically tested and was found to be competent without leak.  Thirteen sutures of 2-0 dacron were placed in simple mattress fashion in the annulus from trigone to trigone posteriorly. A Medtronic Simplici T band was chosen and the sutures passed through the band. The band was seated and the sutures secured via CorKnots. The valve was again statically tested and was found to have a small leak at the medial commissure. A commisuroplasty was performed utilizing 5-0 was performed. The valve then appeared to be hydrostatic.  The left ventricular vent was placed across the mitral valve. The left atrial and interatrial septal portions of the incisions were closed with running 4-0 Prolene. An aortic vent was placed to low suction and the cross clamp released. The right atrial portion of the incision was closed with a 4-0 Prolene suture. Caval tapes were released.  The patient was rewarmed. Cardioversion occurred spontaneously. The underlying rhythm was normal sinus. Atrial and ventricular pacing was used. The patient was weaned and separated from  cardiopulmonary bypass. After discontinuation of cardiopulmonary bypass the heart adequately supported the circulation. Inotropes were used upon leaving the operating room. The left ventricular function was moderatly hypokinetic. The right ventricular function was mildly hypokinetic. Post procedure intraoperative transesophageal echocardiogram demonstrated an ejection fraction of 35% and there was well-seated annuloplasty ring with no mitral regurgitation.  The bypass lines were removed and the Heparin reversed with Protamine. The pericardium was drained with one 24 Fr. Blake drain. The pleural space did not require a chest tube. The sternum was reapproximated with #7 and double stranded stainless steel wires. The linea alba was closed with #2 absorbable sutures. The pectoralis fascia was closed with #0 absorbable sutures. Subcutaneous tissues were closed with 2-0 absorbable suture. The skin was closed with 3-0 absorbable subcuticular suture. Sterile dressings were placed over the wounds.  There were no complications and sponge, instrument, and needle counts were reported as correct. The patient was transported to the Intensive Care Unit in stable condition.   Labs/Other Tests and Data Reviewed:    EKG:  {[phone]}  Recent Labs: No results found for requested labs within last 8760 hours.   Recent Lipid Panel No results found for: CHOL, TRIG, HDL, CHOLHDL, LDLCALC, LDLDIRECT  Wt Readings from Last 3 Encounters:  08/11/18 180 lb 9.6 oz (81.9 kg)  08/11/14 235 lb (106.6 kg)     Objective:    Vital Signs:  There were no vitals taken for this visit.   Well nourished, well developed male in no*** acute distress. ***  ASSESSMENT & PLAN:    1. Mitral regurgitation - complex history as described above with recent repair, appears to have recurrent MR. From recent TEE report can't tell how bad MR is, only that its worst from postop TEE. We will request images - he appears euvolemic,  continue current diuretic - has f/u with [facility] this week to reevaluate possible MVR   ?repeat echo?   2. Chronic systolic HF - LVEF 35-40% by last echo - he is euvolemic currenrtly, no significant symptoms - increase lisinopril to  daily. If tolerates regimen over time likely transition to entresto  3. 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      COVID-19 Education: The signs and symptoms of COVID-19 were discussed with the patient and how to seek care for testing (follow up with PCP or arrange E-visit).  ***The importance of social distancing was discussed today.  Time:   Today, I have spent *** minutes with the patient with telehealth technology discussing the above problems.     Medication Adjustments/Labs and Tests Ordered: Current medicines are reviewed at length with the patient today.  Concerns regarding medicines are outlined above.  Tests Ordered: No  orders of the defined types were placed in this encounter.  Medication Changes: No orders of the defined types were placed in this encounter.   Disposition:  Follow up {follow up:15908}  Joanie Coddington, MD  09/20/2018 8:04 AM    [facility]

## 2018-09-21 ENCOUNTER — Encounter: Payer: Self-pay | Admitting: Cardiology

## 2018-09-21 ENCOUNTER — Encounter: Payer: Self-pay | Admitting: *Deleted

## 2018-09-21 ENCOUNTER — Telehealth (INDEPENDENT_AMBULATORY_CARE_PROVIDER_SITE_OTHER): Payer: Self-pay | Admitting: Cardiology

## 2018-09-21 VITALS — Ht 70.0 in | Wt 190.0 lb

## 2018-09-21 DIAGNOSIS — D8989 Other specified disorders involving the immune mechanism, not elsewhere classified: Secondary | ICD-10-CM

## 2018-09-21 DIAGNOSIS — L88 Pyoderma gangrenosum: Secondary | ICD-10-CM

## 2018-09-21 DIAGNOSIS — I5022 Chronic systolic (congestive) heart failure: Secondary | ICD-10-CM

## 2018-09-21 DIAGNOSIS — I82629 Acute embolism and thrombosis of deep veins of unspecified upper extremity: Secondary | ICD-10-CM

## 2018-09-21 DIAGNOSIS — M479 Spondylosis, unspecified: Secondary | ICD-10-CM

## 2018-09-21 DIAGNOSIS — I340 Nonrheumatic mitral (valve) insufficiency: Secondary | ICD-10-CM

## 2018-09-21 NOTE — Patient Instructions (Signed)
Your physician recommends that you schedule a follow-up appointment IN LATE MAY WITH DR [employer]  Your physician has recommended you make the following change in your medication:   STOP XARELTO  STOP PLAVIX  Thank you for choosing [facility]!!

## 2018-09-21 NOTE — Progress Notes (Signed)
Virtual Visit via Telephone Note   This visit type was conducted due to national recommendations for restrictions regarding the COVID-19 Pandemic (e.g. social distancing) in an effort to limit this patient's exposure and mitigate transmission in our community.  Due to his co-morbid illnesses, this patient is at least at moderate risk for complications without adequate follow up.  This format is felt to be most appropriate for this patient at this time.  The patient did not have access to video technology/had technical difficulties with video requiring transitioning to audio format only (telephone).  All issues noted in this document were discussed and addressed.  No physical exam could be performed with this format.  Please refer to the patient's chart for his  consent to telehealth for [name].   Evaluation Performed:  Follow-up visit  Date:  09/21/2018   ID:  Donald [name] [birth year], MRN [id number]  Patient Location: Home  Provider Location: Office  PCP:  Patient, No Pcp Per  Cardiologist:  Dr Dina Rich MD Electrophysiologist:  None   Chief Complaint:  2 month follow up  History of Present Illness:    Donald [name] is a 33 y.o. male who presents via audio/video conferencing for a telehealth visit today.      1. Mitral regurgitation - admit 05/2018 with acute respiratory failure. From notes had cardaic arrest with induction for intubation.  - TTE done showed severe MR - TEE showed severe prolapse and possible flail A3 scallop with severe MR.   - 06/07/2018 went to OR for MV repair with resection of ruptured anterior papillary muscle,reconstruction of papillary chord with two Neo-chords and placement of Simplici-T Annuloplasty ring via superior septal approach - Intraop/Postop TEE reports no MR initially after surgery   07/2018 TEE during repeat admission mobile echodensity posterior leaflet, possible surgical suture, vs torn chordae vs  vegetation. The MR is not reported regardign severity, ERO 0.93 would suggest severe, unclear  - CT surgery at that time recommended continued medical therapy, reconsider repeat intervention at ouptatient followup - thought perhaps an autoimmune disease may have causes some myxomatous degeneration. Undergoing rheum evaluation   - appears he was initially started on ASA and plavix after repair. When DVT was diagnosed he was to stop plavix and start xarelto, he never stopped taking plavix.    - no recent SOB/DOE. Denies LE edema - home weights 190 lbs he reports slow uptrend. Reports he weighed 200 lbs before recent severe illness, feels he is just gaining his prior weight back  2.Chronic systolic HF - readmitted 07/2018 with volume overload - multiple TTE's and TEEs as reported below. Last LVEF 35-40% by TTE in 07/2018,LVIDd 5.6 and LVIDs 4.3, mild LAE.   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  4. Left coronary cusp density - chronic byechoimaging.    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 Used to work loading trucks.   The patient does not have symptoms concerning for COVID-19 infection (fever, chills, cough, or new shortness of breath).    No past  medical history on file. No past surgical history on file.   No outpatient medications have been marked as taking for the 09/21/18 encounter (Appointment) with [name], MD.     Allergies:   Patient has no known allergies.   Social History   Tobacco Use   Smoking  status: Former Smoker   Smokeless tobacco: Never Used  Substance Use Topics   Alcohol use: No   Drug use: No     Family Hx: The patient's family history is not on file.  ROS:   Please see the history of present illness.    All other systems reviewed and are negative.   Prior CV studies:   The following studies were reviewed today:  06/06/18 Echo [facility] SUMMARY Mild left ventricular hypertrophy The left ventricle is mildly dilated. The apex is contracting normally. The rest of LV wall is hypokinetic. LV ejection fraction = 35-40%.  Left ventricular systolic function is moderately reduced. The right ventricle is normal in size and function. There is severe mitral regurgitation. There is mild mitral valve thickening. Suspect anterior mitral valve prolapse. Recommend TEE for further evaluation. Normal IVC size with decreased respiratory collapse. There is no pericardial effusion. There is no comparison study available.   06/06/2018 TEE [facility] SUMMARY There is severe prolapse (possible flail) of A3 scallop resulting in severe,  posteriorly directed regurgitation with a 0.5 cm gap in closure. Pulmonary  vein flow shows systolic reversal. Papillary muscles appear intact. No  vegetations seen. The left ventricular size is normal.  Left ventricular systolic function is mildly reduced. The right ventricle is normal in size and function. Diffuse thickening of the aortic valve with preserved cusp opening. There is  an extremely small ( <2 mm) echodensity on the ventricular side of the aortic  valve--no significant aortic regurgitation; recommend clinical correlation. There is mild tricuspid regurgitation. No overt vegetations identified. 3D Echocardiography was performed and reviewed for assessment of cardiac  structure and function using a workstation. Findings were integrated into the  echo report.  06/07/18 Intraop TEE Pre-Intervention Summary An Omniplane TEE probe was  inserted orally. A full TEE exam was performed  including 2D, M-Mode, Color and Doppler. The TEE probe was placed  atraumatically on the first attempt. The left ventricle is severely dilated. There is normal left ventricular wall  thickness. Left ventricular systolic function is moderately reduced. The EF  is 30-35%. There is mild to moderate global hypokinesis of the left  ventricle. There is moderate to severe inferior wall hypokinesis. The right ventricle is moderate to severely dilated. The right ventricular  systolic function is mildly reduced. The interatrial septum is intact with no evidence for an atrial septal  defect. The left atrium is severely dilated. Right atrial size is normal. There is a ruptured chordae with flail of the A3 segment of the anterior  mitral leaflet. No significant mitral valve stenosis. There is severe mitral  regurgitation. Flow reversal noted in pulmonary veins consistent with  significant mitral regurgitation. The mitral regurgitant jet is posteriorly  directed, which is consistent with anterior leaflet pathology. The tricuspid valve is normal in structure and function. There is trace  tricuspid regurgitation. The aortic valve is trileaflet. The aortic valve opens well. There is a small  irregularity on the left coronary cusp adjacent to the right coronary cusp.  Cannot exclude aortic valvular vegetation. No hemodynamically significant  valvular aortic stenosis. Trace to mild AI with central jet. The pulmonic valve is normal in structure and  function. > The aortic root is normal size. No obvious dissection could be visualized.  There is no pericardial effusion. There is no pleural effusion.  Post-Intervention Summary The EF remains 30-35%. There is moderate to severe inferior wall hypokinesis. The right ventricular systolic function is normal. The interatrial septum is intact with no evidence for an atrial septal defect. There is no mitral  regurgitation noted. An annuloplasty ring is noted in the  mitral position. There is trace tricuspid regurgitation. Trace to mild AI. The pulmonic valve is not well visualized. No obvious dissection could be visualized. There is no pericardial effusion.  There is no pleural effusion. MMode/2D Measurements &Calculations EDV(MOD-sp4): 72.3 ml ESV(MOD-sp4): 117.0 ml EDV(MOD-sp2): 257.0 ml ESV(MOD-sp2): 150.0 ml SV(MOD-sp4): -44.7 ml Pre-Intervention Doppler Measurements and Calculations MV max PG: 11.1 mmHg MV mean PG: 4.8 mmHg Ao V2 max: 87.0 cm/sec Ao max PG: 3.0 mmHg Ao V2 mean: 51.9 cm/sec Ao mean PG: 1.4 mmHg Ao V2 VTI: 13.4 cm   Overall TEE Interpretation Summary Pre-intervention exam: The LV is severey dilated with mild to moderate global  hypokinesis. The EF is 30-35%. There is moderate to severe hypokinesis of the  inferior wall. The RV systolic function is mildly reduced. There is a  ruptured chordae and flail of the A3 segment of the anterior mitral leaflet.  The posterior leaflet appears normal. Severe MR with posteriorly directed jet  and systolic flow reversal in the pulmonary veins. There is a small echodense  irregularity on the left coronary cusp of the aortic valve adjacent to the  right coronary cusp. Vegetation cannot be ruled out. There is trace to mild  AI with a central jet. There is trace TR. There is no obvious aortic  dissection. There is no pericardial effusion. There is no pleural effusion. Post-intervention exam: The LV EF remains unchanged from the pre-intervention  exam. The RV systolic function has improved and is now normal. There is an  annuloplasty ring in the mitral position. There is no MR. Otherwise the exam  is unchanged from the pre-intervention exam. At the conclusion of the  procedure the probe was removed. There were no apparant complications related  to the TEE examination.   Jun 18 2018 echo SUMMARY There is normal left  ventricular wall thickness. The left ventricle is mildly dilated.  Left ventricular systolic function is mildly reduced. LV ejection fraction = 40-45%. Abnormal (paradoxical) septal motion consistent with post-operative status. There is hypokinesis of the basal to mid inferior wall. The right ventricle is normal in size and function. The left atrial size is normal. An annuloplasty ring is noted in the mitral position. The mean gradient across the mitral valve is 7.6 mmHg. There is no mitral regurgitation noted. The aortic sinus is normal in size. IVC size was normal. There is no pericardial effusion. Compared to the last surface echo dated 06/06/18, the LV systolic function is  slightly improved and the patient is s/p mitral valve repair.  Jul 28 2018 TTE SUMMARY The left ventricle is mildly dilated.  Left ventricular systolic function is moderately reduced. LV ejection fraction = 35-40%. The right ventricle is normal size. The right ventricular systolic function is mildly reduced. The left atrium is mildly dilated. There is mild aortic regurgitation. Status post mitral valve repair with annuloplasty ring There is mild to moderate mitral regurgitation. The mean gradient across the mitral valve is 9.8 mmHg. The heart rate for the mean mitral valve gradient is 92 BPM. There is mild tricuspid regurgitation.  There was insufficient TR detected to calculate RV systolic pressure. Estimated right atrial pressure is 10 mmHg.Marland Kitchen There is no pericardial effusion.   Jul 28 2018 TEE - SUMMARY s/p Mitral valve repair with resection of ruptured anterior papillary muscle,  reconstruction of papillary chord with two Neo-chords and placement of  Simplici-T Annuloplasty ring. Noted a mobile echodensity on the posterior leaflet of the mitral valve,  which may represents surgical suture. Differential include torn mitral valve  chordae or vegetation. Compared to the post-op TEE, MR appears  worse. Clinical correlation advised. There is mild aortic regurgitation. A mobile echodensity noted on the left coronary cusp of the aortic valve  which was seen in the previous TEEs. Noted mild eccentric AI.   Jan 2020 CT scan 1. Soft tissue density in the anterior mediastinum adjacent to the median sternotomy which may simply be postoperative, but cannot exclude cellulitis. No drainable fluid collection. 2. Trace hydropneumopericardium as well as small locules of air in the anterior mediastinum inferiorly, most likely postoperative in nature. 3. Significant improved aeration when compared to prior study with residual atelectasis/scarring in the right lower lobe. There is residual airspace consolidation left lower lobe consistent with resultant discoid atelectasis or pneumonia. 4. . Filling defect in the right jugular vein extending into the right brachiocephalic vein as well as a more broad-based filling defect in the left brachiocephalic vein, consistent with thrombus. 5. Interval sternotomy and mitral valve replacement with expected postoperative changes.   06/07/18 surgery note PREOPERATIVE DIAGNOSIS: ICD-10 I34.0 Mitral valve insufficiency  POSTOPERATIVE DIAGNOSIS: same  QUALITY MEASURES: 4047F Documentation of order for prophylactic antibiotics to be given within one hour (if fluoroquinolone or vancomycin, two hours) prior to surgical incision (or start of procedure when no incision is required) 4041F Documentation of order for cefazolin or cefuroxime for antimicrobial prophylaxis  HEMODYNAMICS AND CATH: Ejection fraction: 35%  VALVE DATA: Mitral Valve: Severe Insufficiency  PRIORITY: Emergent  INCIDENCE: First cardiovascular surgery   PROCEDURE: CPT Codes: 96222 * Mitral valve repair with resection of ruptured anterior papillary muscle, reconstruction of papillary chord with two Neo-chords and placement of Simplici-T Annuloplasty ring via superior septal  approach Model: 670 Serial: [id number]  CARDIOPULMONARY BYPASS TIME: 131 min  AORTIC CROSS CLAMP TIME: 92 min  DESCRIPTION OF OPERATION After induction of anesthesia, the patient was prepped and draped. Before the incision was made, time-out was observed by all members of the surgical and anesthesia teams to identify the correct patient and procedure. The heart was exposed via a full conventional sternotomy. The anatomy of the heart and great vessels was normal. On palpation the aorta was normal.   Intraoperative transesophageal echocardiography showed the left ventricular function to be hypokinetic with an ejection fraction of 35%. Exam of the cardiac valves showed severe mitral regurgitation with anterior A2/A3 leaflet prolapse.  After heparinization, cardiopulmonary bypass was instituted. Arterial perfusion was via an arterial cannula placed in the aorta. For assisted venous return, drainage was from two cannuli placed through the right atrium into the superior and inferior vena cavi. Caval tapes were placed. The patient was cooled to a body core temperature of 36 degrees centigrade. The left ventricle was vented through the both aorta and right superior pulmonary vein.  The ascending aorta was cross-clamped and the heart was arrested with cold DelNido cardioplegia given antegrade. Once dose was adequate for the aforementioned cross clamp time. Topical cooling was used.  The mitral valve was exposed via a superior septal approach incision. The left atrium was  carefully examined. There was no clot present. Retraction sutures were placed and the valve was well visualized. The valve was bicuspid and was diseased from an apparent degenerative etiology. The calcification of the leaflets was none and no calcification of the annulus. The valve was inspected and the defect found to be ruptured anterior papillary muscle causing the A2/A3 prolapse. The anterior leaflet was thickened. There were no vegetations  or stigmata of endocarditis. The valve was amenable to repair.  The ruptured papillary muscle was resected. The chord was then reconstructed utilizing two 5-0 Neo-chords. The valve was statically tested and was found to be competent without leak.  Thirteen sutures of 2-0 dacron were placed in simple mattress fashion in the annulus from trigone to trigone posteriorly. A Medtronic Simplici T band was chosen and the sutures passed through the band. The band was seated and the sutures secured via CorKnots. The valve was again statically tested and was found to have a small leak at the medial commissure. A commisuroplasty was performed utilizing 5-0 was performed. The valve then appeared to be hydrostatic.   The left ventricular vent was placed across the mitral valve. The left atrial and interatrial septal portions of the incisions were closed with running 4-0 Prolene. An aortic vent was placed to low suction and the cross clamp released. The right atrial portion of the incision was closed with a 4-0 Prolene suture. Caval tapes were released.  The patient was rewarmed. Cardioversion occurred spontaneously. The underlying rhythm was normal sinus. Atrial and ventricular pacing was used. The patient was weaned and separated from cardiopulmonary bypass. After discontinuation of cardiopulmonary bypass the heart adequately supported the circulation. Inotropes were used upon leaving the operating room. The left ventricular function was moderatly hypokinetic. The right ventricular function was mildly hypokinetic. Post procedure intraoperative transesophageal echocardiogram demonstrated an ejection fraction of 35% and there was well-seated annuloplasty ring with no mitral regurgitation.  The bypass lines were removed and the Heparin reversed with Protamine. The pericardium was drained with one 24 Fr. Blake drain. The pleural space did not require a chest tube. The sternum was reapproximated with #7 and double stranded  stainless steel wires. The linea alba was closed with #2 absorbable sutures. The pectoralis fascia was closed with #0 absorbable sutures. Subcutaneous tissues were closed with 2-0 absorbable suture. The skin was closed with 3-0 absorbable subcuticular suture. Sterile dressings were placed over the wounds.  There were no complications and sponge, instrument, and needle counts were reported as correct. The patient was transported to the Intensive Care Unit in stable condition.  Labs/Other Tests and Data Reviewed:    EKG:  Na   Recent Labs: No results found for requested labs within last 8760 hours.   Recent Lipid Panel No results found for: CHOL, TRIG, HDL, CHOLHDL, LDLCALC, LDLDIRECT  Wt Readings from Last 3 Encounters:  08/11/18 180 lb 9.6 oz (81.9 kg)  08/11/14 235 lb (106.6 kg)     Objective:    Vital Signs:  There were no vitals taken for this visit.   Normal affect. Normal speech patterns, sounds comfortable in no disstress  ASSESSMENT & PLAN:    1. Mitral regurgitation - complex history as described above with recent repair, appears to have recurrent MR. From recent TEE report can't tell how bad MR is, only that its worst from postop TEE. Awaiting images, discussed with his surgeon and appears to be moderate - some weight gain but no new symptoms or edema, he reports he lost  significant weight during this extended illness, previously 200 lbs. Continue to monitor, symptoms do not suggest recurrent HF.  - continue current meds. He was placed on ASA and plavix after MV repair, was to stop plavix when he started xarelto for DVT but never stopped taking. We will stop plavix and xarelto, continue ASA alone, he is more than 3 months out from repair.  - repeat echo next month hopefully when COVID-19 restrictions are lessened  - he will need to f/u with CT [facility] in June  2. Chronic systolic HF - LVEF 35-40% by last echo - no recent symptoms - continue current meds.  Avoid med titration as we are limited to tele visit and I cannot get vitals or have him easily get follow up labs - if repeat echo next month shows persistent LV dysfunction likely change lisinopril to entresto.    3. Brachial vein DVT - he has completed 3 months of xarelto, initial provoked DVT. We will stop xarelto.      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     COVID-19 Education: The signs and symptoms of COVID-19 were discussed with the patient and how to seek care for testing (follow up with PCP or arrange E-visit).  The importance of social distancing was discussed today.  Time:   Today, I have spent 25 minutes with the patient with telehealth technology discussing the above problems.     Medication Adjustments/Labs and Tests Ordered: Current medicines are reviewed at length with the patient today.  Concerns regarding medicines are outlined above.  Tests Ordered: No orders of the defined types were placed in this encounter.  Medication Changes: No orders of the defined types were placed in this encounter.   Disposition:  Follow up mid to late May  Signed, Jamel Holzmann, MD  09/21/2018 8:07 AM    [facility]

## 2018-10-20 ENCOUNTER — Telehealth: Payer: Self-pay | Admitting: Cardiology

## 2018-10-20 NOTE — Telephone Encounter (Signed)
Spoke with pt who states that he received a letter in the mail today stating that he does not qualify for disability. Pt would like know what types of jobs he is able to do. Please advise

## 2018-10-20 NOTE — Telephone Encounter (Signed)
Please give pt a call, has some questions about what kind of jobs that he is able to do since he had surgery he has restrictions.

## 2018-10-21 NOTE — Telephone Encounter (Signed)
Pt notified and voiced understanding 

## 2018-10-21 NOTE — Telephone Encounter (Signed)
Will defer discussion to Dr. Wyline Mood once he returns next week.

## 2018-11-18 DIAGNOSIS — I38 Endocarditis, valve unspecified: Secondary | ICD-10-CM | POA: Diagnosis not present

## 2018-11-18 DIAGNOSIS — Z9889 Other specified postprocedural states: Secondary | ICD-10-CM | POA: Diagnosis not present

## 2018-11-18 DIAGNOSIS — I5023 Acute on chronic systolic (congestive) heart failure: Secondary | ICD-10-CM | POA: Diagnosis not present

## 2018-12-01 ENCOUNTER — Telehealth: Payer: Self-pay | Admitting: Cardiology

## 2018-12-01 NOTE — Telephone Encounter (Signed)
Virtual Visit Pre-Appointment Phone Call  "(Name), I am calling you today to discuss your upcoming appointment. We are currently trying to limit exposure to the virus that causes COVID-19 by seeing patients at home rather than in the office."  1. "What is the BEST phone number to call the day of the visit?" - include this in appointment notes  2. Do you have or have access to (through a family member/friend) a smartphone with video capability that we can use for your visit?" a. If yes - list this number in appt notes as cell (if different from BEST phone #) and list the appointment type as a VIDEO visit in appointment notes b. If no - list the appointment type as a PHONE visit in appointment notes  3. Confirm consent - "In the setting of the current Covid19 crisis, you are scheduled for a (phone or video) visit with your provider on (date) at (time).  Just as we do with many in-office visits, in order for you to participate in this visit, we must obtain consent.  If you'd like, I can send this to your mychart (if signed up) or email for you to review.  Otherwise, I can obtain your verbal consent now.  All virtual visits are billed to your insurance company just like a normal visit would be.  By agreeing to a virtual visit, we'd like you to understand that the technology does not allow for your provider to perform an examination, and thus may limit your provider's ability to fully assess your condition. If your provider identifies any concerns that need to be evaluated in person, we will make arrangements to do so.  Finally, though the technology is pretty good, we cannot assure that it will always work on either your or our end, and in the setting of a video visit, we may have to convert it to a phone-only visit.  In either situation, we cannot ensure that we have a secure connection.  Are you willing to proceed?" STAFF: Did the patient verbally acknowledge consent to telehealth visit? Document  YES/NO here: yes  4. Advise patient to be prepared - "Two hours prior to your appointment, go ahead and check your blood pressure, pulse, oxygen saturation, and your weight (if you have the equipment to check those) and write them all down. When your visit starts, your provider will ask you for this information. If you have an Apple Watch or Kardia device, please plan to have heart rate information ready on the day of your appointment. Please have a pen and paper handy nearby the day of the visit as well."  5. Give patient instructions for MyChart download to smartphone OR Doximity/Doxy.me as below if video visit (depending on what platform provider is using)  6. Inform patient they will receive a phone call 15 minutes prior to their appointment time (may be from unknown caller ID) so they should be prepared to answer    TELEPHONE CALL NOTE  Richard R Slacum has been deemed a candidate for a follow-up tele-health visit to limit community exposure during the Covid-19 pandemic. I spoke with the patient via phone to ensure availability of phone/video source, confirm preferred email & phone number, and discuss instructions and expectations.  I reminded Que R Wisdom to be prepared with any vital sign and/or heart rhythm information that could potentially be obtained via home monitoring, at the time of his visit. I reminded Asif R Sawa to expect a phone call prior to  his visit.  Weston Anna 12/01/2018 2:48 PM   INSTRUCTIONS FOR DOWNLOADING THE MYCHART APP TO SMARTPHONE  - The patient must first make sure to have activated MyChart and know their login information - If Apple, go to [employer] and type in MyChart in the search bar and download the app. If Android, ask patient to go to [employer] and type in [place] in the search bar and download the app. The app is free but as with any other app downloads, their phone may require them to verify saved payment information or  Apple/Android password.  - The patient will need to then log into the app with their MyChart username and password, and select [facility] as their healthcare provider to link the account. When it is time for your visit, go to the MyChart app, find appointments, and click Begin Video Visit. Be sure to Select Allow for your device to access the Microphone and Camera for your visit. You will then be connected, and your provider will be with you shortly.  **If they have any issues connecting, or need assistance please contact MyChart service desk [phone])**  **If using a computer, in order to ensure the best quality for their visit they will need to use either of the following Internet Browsers: [employer], or Google Chrome**  IF USING DOXIMITY or DOXY.ME - The patient will receive a link just prior to their visit by text.     FULL LENGTH CONSENT FOR TELE-HEALTH VISIT   I hereby voluntarily request, consent and authorize [facility] and its employed or contracted physicians, physician assistants, nurse practitioners or other licensed health care professionals (the Practitioner), to provide me with telemedicine health care services (the Services") as deemed necessary by the treating Practitioner. I acknowledge and consent to receive the Services by the Practitioner via telemedicine. I understand that the telemedicine visit will involve communicating with the Practitioner through live audiovisual communication technology and the disclosure of certain medical information by electronic transmission. I acknowledge that I have been given the opportunity to request an in-person assessment or other available alternative prior to the telemedicine visit and am voluntarily participating in the telemedicine visit.  I understand that I have the right to withhold or withdraw my consent to the use of telemedicine in the course of my care at any time, without affecting my right to future care  or treatment, and that the Practitioner or I may terminate the telemedicine visit at any time. I understand that I have the right to inspect all information obtained and/or recorded in the course of the telemedicine visit and may receive copies of available information for a reasonable fee.  I understand that some of the potential risks of receiving the Services via telemedicine include:   Delay or interruption in medical evaluation due to technological equipment failure or disruption;  Information transmitted may not be sufficient (e.g. poor resolution of images) to allow for appropriate medical decision making by the Practitioner; and/or   In rare instances, security protocols could fail, causing a breach of personal health information.  Furthermore, I acknowledge that it is my responsibility to provide information about my medical history, conditions and care that is complete and accurate to the best of my ability. I acknowledge that Practitioner's advice, recommendations, and/or decision may be based on factors not within their control, such as incomplete or inaccurate data provided by me or distortions of diagnostic images or specimens that may result from electronic transmissions. I  understand that the practice of medicine is not an exact science and that Practitioner makes no warranties or guarantees regarding treatment outcomes. I acknowledge that I will receive a copy of this consent concurrently upon execution via email to the email address I last provided but may also request a printed copy by calling the office of [facility].    I understand that my insurance will be billed for this visit.   I have read or had this consent read to me.  I understand the contents of this consent, which adequately explains the benefits and risks of the Services being provided via telemedicine.   I have been provided ample opportunity to ask questions regarding this consent and the Services and have had  my questions answered to my satisfaction.  I give my informed consent for the services to be provided through the use of telemedicine in my medical care  By participating in this telemedicine visit I agree to the above.

## 2018-12-03 ENCOUNTER — Encounter: Payer: Self-pay | Admitting: *Deleted

## 2018-12-06 ENCOUNTER — Encounter: Payer: Self-pay | Admitting: Cardiology

## 2018-12-06 ENCOUNTER — Telehealth (INDEPENDENT_AMBULATORY_CARE_PROVIDER_SITE_OTHER): Payer: Self-pay | Admitting: Cardiology

## 2018-12-06 VITALS — Ht 70.0 in | Wt 200.0 lb

## 2018-12-06 DIAGNOSIS — I5022 Chronic systolic (congestive) heart failure: Secondary | ICD-10-CM

## 2018-12-06 DIAGNOSIS — I340 Nonrheumatic mitral (valve) insufficiency: Secondary | ICD-10-CM

## 2018-12-06 NOTE — Patient Instructions (Addendum)
Your physician recommends that you schedule a follow-up appointment in: 3 MONTHS WITH DR BRANCH  Your physician recommends that you continue on your current medications as directed. Please refer to the Current Medication list given to you today.  Thank you for choosing [facility]!!    

## 2018-12-06 NOTE — Progress Notes (Signed)
Virtual Visit via Telephone Note   This visit type was conducted due to national recommendations for restrictions regarding the COVID-19 Pandemic (e.g. social distancing) in an effort to limit this patient's exposure and mitigate transmission in our community.  Due to his co-morbid illnesses, this patient is at least at moderate risk for complications without adequate follow up.  This format is felt to be most appropriate for this patient at this time.  The patient did not have access to video technology/had technical difficulties with video requiring transitioning to audio format only (telephone).  All issues noted in this document were discussed and addressed.  No physical exam could be performed with this format.  Please refer to the patient's chart for his  consent to telehealth for [facility].   Date:  12/06/2018   ID:  Donald [name], DOB 01-11-1986, MRN [id number]  Patient Location: Home Provider Location: Office  PCP:  Patient, No Pcp Per  Cardiologist:  Dina RichBranch, Jesse Nosbisch, MD  Electrophysiologist:  None   Evaluation Performed:  Follow-Up Visit  Chief Complaint:  2 month follow up  History of Present Illness:    Donald [name] is a 33 y.o. male seen today for follow up of the following medical problems.   1. Mitral regurgitation - admit 05/2018 with acute respiratory failure. From notes had cardaic arrest with induction for intubation.  - TTE done showed severe MR - TEE showed severe prolapse and possible flail A3 scallop with severe MR.   - 06/07/2018 went to OR for MV repair with resection of ruptured anterior papillary muscle,reconstruction of papillary chord with two Neo-chords and placement of Simplici-T Annuloplasty ring via superior septal approach - Intraop/Postop TEE reports no MR initially after surgery   07/2018 TEE during repeat admission mobile echodensity posterior leaflet, possible surgical suture, vs torn chordae vs vegetation. The MR is not  reported regardign severity, ERO 0.93 would suggest severe, unclear  - CT surgery at that time recommended continued medical therapy, reconsider repeat intervention at ouptatient followup - thought perhaps an autoimmune disease may have causes some myxomatous degeneration. Undergoing rheum evaluation     - walks daily up to 1 mile without troubles. No SOB or DOE. No recent edema. Home weights stable. His baseline weight around 200 lbs. - compliant with meds - about to return to work    2.Chronic systolic HF - readmitted 07/2018 with volume overload - multiple TTE's and TEEs as reported below. Last LVEF 35-40% by TTE in 07/2018,LVIDd 5.6 and LVIDs 4.3, mild LAE.   - discharged on Lasix 40mg  daily, lisinopril 5mg , TOprol 25mg , ASA 81, atorva 10,    - no recent SOB or DOE. NO edema, weights stable around 200 lbs.    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  4. Left coronary cusp density - chronic byechoimaging.    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    Past Medical History:  Diagnosis Date  . Anemia   . Autoimmune disorder (HCC)    pyoderma gangrenosum  . Chronic  systolic heart failure (HCC)   . DVT (deep venous thrombosis) (HCC)    h/o  . Mitral regurgitation   . Myocardial infarction (HCC)   . Seronegative spondylitis (HCC)    arthritis   Past Surgical History:  Procedure Laterality Date  . HERNIA REPAIR Right 2018  . MITRAL VALVE REPLACEMENT  06/07/2018   [facility]      No outpatient medications have been marked as taking for the 12/06/18 encounter (Appointment) with [name], MD.     Allergies:   Patient has no known allergies.   Social History   Tobacco Use  . Smoking status: Former Games developer  . Smokeless tobacco: Never Used  Substance Use Topics  . Alcohol use: No  . Drug use: No     Family Hx: The patient's family history includes Depression in his father; Diabetes in his father and paternal grandmother; Multiple sclerosis in his mother; Psoriasis in his mother.  ROS:   Please see the history of present illness.     All other systems reviewed and are negative.   Prior CV studies:   The following studies were reviewed today:  06/06/18 Echo [facility] SUMMARY Mild left ventricular hypertrophy The left ventricle is mildly dilated. The apex is contracting normally. The rest of LV wall is hypokinetic. LV ejection fraction = 35-40%.  Left ventricular systolic function is moderately reduced. The right ventricle is normal in size and function. There is severe mitral regurgitation. There is mild mitral valve thickening. Suspect anterior mitral valve prolapse. Recommend TEE for further evaluation. Normal IVC size with decreased respiratory collapse. There is no pericardial effusion. There is no comparison study available.   06/06/2018 TEE [facility] SUMMARY There is severe prolapse (possible flail) of A3 scallop resulting in severe,  posteriorly directed regurgitation with a 0.5 cm gap in closure. Pulmonary  vein flow shows systolic reversal. Papillary muscles appear intact. No  vegetations seen. The left ventricular size is normal.  Left ventricular systolic function is mildly reduced. The right ventricle is normal in size and function. Diffuse thickening of the aortic valve with preserved cusp opening. There is  an extremely small ( <2 mm) echodensity on the ventricular side of the aortic  valve--no significant aortic regurgitation;  recommend clinical correlation. There is mild tricuspid regurgitation. No overt vegetations identified. 3D Echocardiography was performed and reviewed for assessment of cardiac  structure and function using a workstation. Findings were integrated into the  echo report.  06/07/18 Intraop TEE Pre-Intervention Summary An Omniplane TEE probe was inserted orally. A full TEE exam was performed  including 2D, M-Mode, Color and Doppler. The TEE probe was placed  atraumatically on the first attempt. The left ventricle is severely dilated. There is normal left ventricular wall  thickness. Left ventricular systolic function is moderately reduced. The EF  is 30-35%. There is mild to moderate global hypokinesis of the left  ventricle. There is moderate to severe inferior wall hypokinesis. The right ventricle is moderate to severely dilated. The right ventricular  systolic function is mildly reduced. The interatrial septum is intact with no evidence for an atrial septal  defect. The left atrium is severely dilated. Right atrial size is normal. There is a ruptured chordae with flail of the A3 segment of the anterior  mitral leaflet. No significant mitral valve stenosis. There is severe mitral  regurgitation. Flow reversal noted in pulmonary veins consistent with  significant mitral regurgitation. The mitral regurgitant jet is posteriorly  directed, which is consistent with  anterior leaflet pathology. The tricuspid valve is normal in structure and function. There is trace  tricuspid regurgitation. The aortic valve is trileaflet. The aortic valve opens well. There is a small  irregularity on the left coronary cusp adjacent to the right coronary cusp.  Cannot exclude aortic valvular vegetation. No hemodynamically significant  valvular aortic stenosis. Trace to mild AI with central jet. The pulmonic valve is normal in structure and function. > The aortic root is normal size. No obvious dissection could  be visualized.  There is no pericardial effusion. There is no pleural effusion.  Post-Intervention Summary The EF remains 30-35%. There is moderate to severe inferior wall hypokinesis. The right ventricular systolic function is normal. The interatrial septum is intact with no evidence for an atrial septal defect. There is no mitral regurgitation noted. An annuloplasty ring is noted in the  mitral position. There is trace tricuspid regurgitation. Trace to mild AI. The pulmonic valve is not well visualized. No obvious dissection could be visualized. There is no pericardial effusion.  There is no pleural effusion. MMode/2D Measurements &Calculations EDV(MOD-sp4): 72.3 ml ESV(MOD-sp4): 117.0 ml EDV(MOD-sp2): 257.0 ml ESV(MOD-sp2): 150.0 ml SV(MOD-sp4): -44.7 ml Pre-Intervention Doppler Measurements and Calculations MV max PG: 11.1 mmHg MV mean PG: 4.8 mmHg Ao V2 max: 87.0 cm/sec Ao max PG: 3.0 mmHg Ao V2 mean: 51.9 cm/sec Ao mean PG: 1.4 mmHg Ao V2 VTI: 13.4 cm   Overall TEE Interpretation Summary Pre-intervention exam: The LV is severey dilated with mild to moderate global  hypokinesis. The EF is 30-35%. There is moderate to severe hypokinesis of the  inferior wall. The RV systolic function is mildly reduced. There is a  ruptured chordae and flail of the A3 segment of the anterior mitral leaflet.  The posterior leaflet appears normal. Severe MR with posteriorly directed jet  and systolic flow reversal in the pulmonary veins. There is a small echodense  irregularity on the left coronary cusp of the aortic valve adjacent to the  right coronary cusp. Vegetation cannot be ruled out. There is trace to mild  AI with a central jet. There is trace TR. There is no obvious aortic  dissection. There is no pericardial effusion. There is no pleural effusion. Post-intervention exam: The LV EF remains unchanged from the pre-intervention  exam. The RV systolic function has improved and is now  normal. There is an  annuloplasty ring in the mitral position. There is no MR. Otherwise the exam  is unchanged from the pre-intervention exam. At the conclusion of the  procedure the probe was removed. There were no apparant complications related  to the TEE examination.   Jun 18 2018 echo SUMMARY There is normal left ventricular wall thickness. The left ventricle is mildly dilated.  Left ventricular systolic function is mildly reduced. LV ejection fraction = 40-45%. Abnormal (paradoxical) septal motion consistent with post-operative status. There is hypokinesis of the basal to mid inferior wall. The right ventricle is normal in size and function. The left atrial size is normal. An annuloplasty ring is noted in the mitral position. The mean gradient across the mitral valve is 7.6 mmHg. There is no mitral regurgitation noted. The aortic sinus is normal in size. IVC size was normal. There is no pericardial effusion. Compared to the last surface echo dated 06/06/18, the LV systolic function is  slightly improved and the patient is s/p mitral valve repair.  Jul 28 2018 TTE SUMMARY The left ventricle is mildly dilated.  Left ventricular systolic function is moderately  reduced. LV ejection fraction = 35-40%. The right ventricle is normal size. The right ventricular systolic function is mildly reduced. The left atrium is mildly dilated. There is mild aortic regurgitation. Status post mitral valve repair with annuloplasty ring There is mild to moderate mitral regurgitation. The mean gradient across the mitral valve is 9.8 mmHg. The heart rate for the mean mitral valve gradient is 92 BPM. There is mild tricuspid regurgitation. There was insufficient TR detected to calculate RV systolic pressure. Estimated right atrial pressure is 10 mmHg.Marland Kitchen There is no pericardial effusion.   Jul 28 2018 TEE - SUMMARY s/p Mitral valve repair with resection of ruptured anterior papillary  muscle,  reconstruction of papillary chord with two Neo-chords and placement of  Simplici-T Annuloplasty ring. Noted a mobile echodensity on the posterior leaflet of the mitral valve,  which may represents surgical suture. Differential include torn mitral valve  chordae or vegetation. Compared to the post-op TEE, MR appears worse. Clinical correlation advised. There is mild aortic regurgitation. A mobile echodensity noted on the left coronary cusp of the aortic valve  which was seen in the previous TEEs. Noted mild eccentric AI.   Jan 2020 CT scan 1. Soft tissue density in the anterior mediastinum adjacent to the median sternotomy which may simply be postoperative, but cannot exclude cellulitis. No drainable fluid collection. 2. Trace hydropneumopericardium as well as small locules of air in the anterior mediastinum inferiorly, most likely postoperative in nature. 3. Significant improved aeration when compared to prior study with residual atelectasis/scarring in the right lower lobe. There is residual airspace consolidation left lower lobe consistent with resultant discoid atelectasis or pneumonia. 4. . Filling defect in the right jugular vein extending into the right brachiocephalic vein as well as a more broad-based filling defect in the left brachiocephalic vein, consistent with thrombus. 5. Interval sternotomy and mitral valve replacement with expected postoperative changes.   06/07/18 surgery note PREOPERATIVE DIAGNOSIS: ICD-10 I34.0 Mitral valve insufficiency  POSTOPERATIVE DIAGNOSIS: same  QUALITY MEASURES: 4047F Documentation of order for prophylactic antibiotics to be given within one hour (if fluoroquinolone or vancomycin, two hours) prior to surgical incision (or start of procedure when no incision is required) 4041F Documentation of order for cefazolin or cefuroxime for antimicrobial prophylaxis  HEMODYNAMICS AND CATH: Ejection fraction: 35%  VALVE DATA: Mitral  Valve: Severe Insufficiency  PRIORITY: Emergent  INCIDENCE: First cardiovascular surgery   PROCEDURE: CPT Codes: 31517 * Mitral valve repair with resection of ruptured anterior papillary muscle, reconstruction of papillary chord with two Neo-chords and placement of Simplici-T Annuloplasty ring via superior septal approach Model: 670 Serial: [id number]  CARDIOPULMONARY BYPASS TIME: 131 min  AORTIC CROSS CLAMP TIME: 92 min  DESCRIPTION OF OPERATION After induction of anesthesia, the patient was prepped and draped. Before the incision was made, time-out was observed by all members of the surgical and anesthesia teams to identify the correct patient and procedure. The heart was exposed via a full conventional sternotomy. The anatomy of the heart and great vessels was normal. On palpation the aorta was normal.   Intraoperative transesophageal echocardiography showed the left ventricular function to be hypokinetic with an ejection fraction of 35%. Exam of the cardiac valves showed severe mitral regurgitation with anterior A2/A3 leaflet prolapse.  After heparinization, cardiopulmonary bypass was instituted. Arterial perfusion was via an arterial cannula placed in the aorta. For assisted venous return, drainage was from two cannuli placed through the right atrium into the superior and inferior vena cavi. Caval tapes were placed. The  patient was cooled to a body core temperature of 36 degrees centigrade. The left ventricle was vented through the both aorta and right superior pulmonary vein.  The ascending aorta was cross-clamped and the heart was arrested with cold DelNido cardioplegia given antegrade. Once dose was adequate for the aforementioned cross clamp time. Topical cooling was used.  The mitral valve was exposed via a superior septal approach incision. The left atrium was carefully examined. There was no clot present. Retraction sutures were placed and the valve was well visualized. The valve  was bicuspid and was diseased from an apparent degenerative etiology. The calcification of the leaflets was none and no calcification of the annulus. The valve was inspected and the defect found to be ruptured anterior papillary muscle causing the A2/A3 prolapse. The anterior leaflet was thickened. There were no vegetations or stigmata of endocarditis. The valve was amenable to repair.  The ruptured papillary muscle was resected. The chord was then reconstructed utilizing two 5-0 Neo-chords. The valve was statically tested and was found to be competent without leak.  Thirteen sutures of 2-0 dacron were placed in simple mattress fashion in the annulus from trigone to trigone posteriorly. A Medtronic Simplici T band was chosen and the sutures passed through the band. The band was seated and the sutures secured via CorKnots. The valve was again statically tested and was found to have a small leak at the medial commissure. A commisuroplasty was performed utilizing 5-0 was performed. The valve then appeared to be hydrostatic.   The left ventricular vent was placed across the mitral valve. The left atrial and interatrial septal portions of the incisions were closed with running 4-0 Prolene. An aortic vent was placed to low suction and the cross clamp released. The right atrial portion of the incision was closed with a 4-0 Prolene suture. Caval tapes were released.  The patient was rewarmed. Cardioversion occurred spontaneously. The underlying rhythm was normal sinus. Atrial and ventricular pacing was used. The patient was weaned and separated from cardiopulmonary bypass. After discontinuation of cardiopulmonary bypass the heart adequately supported the circulation. Inotropes were used upon leaving the operating room. The left ventricular function was moderatly hypokinetic. The right ventricular function was mildly hypokinetic. Post procedure intraoperative transesophageal echocardiogram demonstrated an ejection  fraction of 35% and there was well-seated annuloplasty ring with no mitral regurgitation.  The bypass lines were removed and the Heparin reversed with Protamine. The pericardium was drained with one 24 Fr. Blake drain. The pleural space did not require a chest tube. The sternum was reapproximated with #7 and double stranded stainless steel wires. The linea alba was closed with #2 absorbable sutures. The pectoralis fascia was closed with #0 absorbable sutures. Subcutaneous tissues were closed with 2-0 absorbable suture. The skin was closed with 3-0 absorbable subcuticular suture. Sterile dressings were placed over the wounds.  There were no complications and sponge, instrument, and needle counts were reported as correct. The patient was transported to the Intensive Care Unit in stable condition.    Labs/Other Tests and Data Reviewed:    EKG:  na  Recent Labs: No results found for requested labs within last 8760 hours.   Recent Lipid Panel No results found for: CHOL, TRIG, HDL, CHOLHDL, LDLCALC, LDLDIRECT  Wt Readings from Last 3 Encounters:  09/21/18 190 lb (86.2 kg)  08/11/18 180 lb 9.6 oz (81.9 kg)  08/11/14 235 lb (106.6 kg)     Objective:    Vital Signs:   Today's Vitals   12/06/18  0848  Weight: 200 lb (90.7 kg)  Height: 5\' 10"  (1.778 m)   Body mass index is 28.7 kg/m.  Normal affect. Normal speech pattern and tone. No audibel signs of SOB or wheezing. Comfortable, no apprent distress.   ASSESSMENT & PLAN:   1. Mitral regurgitation - complex history as described above with recent repair, appears to have recurrent MR.  - no current symptoms, continue to monitor at this time. [facility] in [facility], at last check from there note he had recurrent moderate MR    2. Chronic systolic HF - LVEF 35-40% by last echo - no recent symptoms - at recent CT surgery visit HR 60, would not titrate beta blocker. I have not adjusted his ACE or changed to entresto  as of yet, with COVID-19 would look to limit health exposures. If repeat echo in ext few months shows consistent dysfunction would plan to change to entrest at that time.       COVID-19 Education: The signs and symptoms of COVID-19 were discussed with the patient and how to seek care for testing (follow up with PCP or arrange E-visit).  The importance of social distancing was discussed today.  Time:   Today, I have spent 13 minutes with the patient with telehealth technology discussing the above problems.     Medication Adjustments/Labs and Tests Ordered: Current medicines are reviewed at length with the patient today.  Concerns regarding medicines are outlined above.   Tests Ordered: No orders of the defined types were placed in this encounter.   Medication Changes: No orders of the defined types were placed in this encounter.   Follow Up:  Virtual Visit 3 months  Signed, Dina RichBranch, Lam Bjorklund, MD  12/06/2018 8:17 AM    [facility]

## 2018-12-23 ENCOUNTER — Other Ambulatory Visit: Payer: Self-pay | Admitting: Cardiology

## 2018-12-23 MED ORDER — METOPROLOL SUCCINATE ER 25 MG PO TB24: 25.0000 mg | ORAL_TABLET | Freq: Every day | ORAL | 1 refills | Status: DC

## 2018-12-23 MED ORDER — FERROUS SULFATE 325 (65 FE) MG PO TABS: 325.0000 mg | ORAL_TABLET | Freq: Every day | ORAL | 1 refills | Status: DC

## 2018-12-23 MED ORDER — LISINOPRIL 10 MG PO TABS: 10.0000 mg | ORAL_TABLET | Freq: Every day | ORAL | 1 refills | Status: DC

## 2018-12-23 MED ORDER — FUROSEMIDE 40 MG PO TABS: 40.0000 mg | ORAL_TABLET | Freq: Every day | ORAL | 1 refills | Status: DC

## 2018-12-23 NOTE — Telephone Encounter (Signed)
lisinopril (PRINIVIL,ZESTRIL) 10 MG tablet [[id number]] ENDED metoprolol succinate (TOPROL-XL) 25 MG 24 hr tablet [[id number]]  furosemide (LASIX) 40 MG tablet [[id number]]  ferrous sulfate 325 (65 FE) MG tablet [[id number]]   Pt is needing these sent to [facility] in [place]

## 2018-12-23 NOTE — Telephone Encounter (Signed)
Okay to refill ferrous sulfate per Dr. Harl Bowie.

## 2019-01-26 ENCOUNTER — Telehealth: Payer: Self-pay | Admitting: Cardiology

## 2019-01-26 MED ORDER — ATORVASTATIN CALCIUM 10 MG PO TABS: 10.0000 mg | ORAL_TABLET | Freq: Every day | ORAL | 2 refills | Status: DC

## 2019-01-26 NOTE — Telephone Encounter (Signed)
Can he come tomorrow for a vitals check, weight check, and orthostatics please with a nursing visit   Zandra Abts MD

## 2019-01-26 NOTE — Telephone Encounter (Signed)
Patient returning call.

## 2019-01-26 NOTE — Telephone Encounter (Signed)
Patient informed will come tomorrow at 11:40 am      COVID-19 Pre-Screening Questions:  . In the past 7 to 10 days have you had a cough,  shortness of breath, headache, congestion, fever (100 or greater) body aches, chills, sore throat, or sudden loss of taste or sense of smell? No . Have you been around anyone with known Covid 19. No . Have you been around anyone who is awaiting Covid 19 test results in the past 7 to 10 days? No . Have you been around anyone who has been exposed to Covid 19, or has mentioned symptoms of Covid 19 within the past 7 to 10 days? No  If you have any concerns/questions about symptoms patients report during screening (either on the phone or at threshold). Contact the provider seeing the patient or DOD for further guidance.  If neither are available contact a member of the leadership team.

## 2019-01-26 NOTE — Telephone Encounter (Signed)
Reports for the past 2 weeks, has been having dizzy spells with movement of head or just sitting which last about 5 minutes. Reports spending a lot of time outside but reports staying hydrated by drinking at least 3 cups of water daily in addition to drinking juice throughout the day. Denies chest pain or sob. Has not checked BP or HR when feeling dizzy and does not have monitor at home. Medications reconciled. Has been out of ferrous sulfate for over a month. Advised that refill sent 12/23/2018 with confirmed receipt. Also advised that the iron pill is OTC as well. Advised that if his symptoms get worse, to go got the ED for an evaluation. Verbalized understanding.

## 2019-01-26 NOTE — Telephone Encounter (Signed)
Pt called stating he's been feeling dizzy more often. He can be sitting down and has a dizzy spell   Please give pt a call @ [phone]

## 2019-01-27 ENCOUNTER — Other Ambulatory Visit: Payer: Self-pay

## 2019-01-27 ENCOUNTER — Ambulatory Visit (INDEPENDENT_AMBULATORY_CARE_PROVIDER_SITE_OTHER): Payer: Self-pay | Admitting: *Deleted

## 2019-01-27 VITALS — BP 111/80 | HR 80 | Ht 70.0 in | Wt 197.8 lb

## 2019-01-27 DIAGNOSIS — I5022 Chronic systolic (congestive) heart failure: Secondary | ICD-10-CM

## 2019-01-27 NOTE — Patient Instructions (Signed)
Continue same plan and follow up Pick up ferrous sulfate 325 mg from pharmacy OTC.

## 2019-01-27 NOTE — Progress Notes (Signed)
Presents for nurse visit to have orthostatics and vitals per recent phone note. Reports intermittent dizzy spells. Denies chest pain or sob. Medications reconciled. Has been out of aspirin for 2 weeks and ferrous sulfate for >1 month. Samples given of aspirin 81 mg and advised to get iron pill OTC.

## 2019-01-31 ENCOUNTER — Telehealth: Payer: Self-pay | Admitting: *Deleted

## 2019-01-31 NOTE — Telephone Encounter (Addendum)
-----   Message from Arnoldo Lenis, MD sent at 01/31/2019 11:57 AM EDT ----- Some change in heart rate with orthostatics suggesting he could be dehydrated, even though he drinks fluids sometimes being on too much fluid pill can over power what you drink. Verify taking lasix 40mg  daily, if so lower to 20mg  daily, may take 40mg  on days if significnat swelling or SOB.   J BrancH DM  ----- Message ----- From: Merlene Laughter, RN Sent: 01/27/2019  11:59 AM EDT To: Arnoldo Lenis, MD

## 2019-01-31 NOTE — Progress Notes (Signed)
Some change in heart rate with orthostatics suggesting he could be dehydrated, even though he drinks fluids sometimes being on too much fluid pill can over power what you drink. Verify taking lasix 40mg  daily, if so lower to 20mg  daily, may take 40mg  on days if significnat swelling or SOB.   J Jenay Morici DM

## 2019-02-10 MED ORDER — FUROSEMIDE 40 MG PO TABS: 20.0000 mg | ORAL_TABLET | Freq: Every day | ORAL | 1 refills | Status: DC

## 2019-02-10 NOTE — Telephone Encounter (Signed)
Patient informed and verbalized understanding of plan. 

## 2019-03-03 DIAGNOSIS — R062 Wheezing: Secondary | ICD-10-CM | POA: Diagnosis not present

## 2019-03-03 DIAGNOSIS — R05 Cough: Secondary | ICD-10-CM | POA: Diagnosis not present

## 2019-03-03 DIAGNOSIS — J40 Bronchitis, not specified as acute or chronic: Secondary | ICD-10-CM | POA: Diagnosis not present

## 2019-03-07 ENCOUNTER — Telehealth: Payer: Self-pay | Admitting: Cardiology

## 2019-03-07 NOTE — Telephone Encounter (Signed)
Virtual Visit Pre-Appointment Phone Call  "(Name), I am calling you today to discuss your upcoming appointment. We are currently trying to limit exposure to the virus that causes COVID-19 by seeing patients at home rather than in the office."  1. "What is the BEST phone number to call the day of the visit?" -   [phone] 2.  3. Do you have or have access to (through a family member/friend) a smartphone with video capability that we can use for your visit?" a. If yes - list this number in appt notes as cell (if different from BEST phone #) and list the appointment type as a VIDEO visit in appointment notes b. If no - list the appointment type as a PHONE visit in appointment notes  4. Confirm consent - "In the setting of the current Covid19 crisis, you are scheduled for a (phone or video) visit with your provider on (date) at (time).  Just as we do with many in-office visits, in order for you to participate in this visit, we must obtain consent.  If you'd like, I can send this to your mychart (if signed up) or email for you to review.  Otherwise, I can obtain your verbal consent now.  All virtual visits are billed to your insurance company just like a normal visit would be.  By agreeing to a virtual visit, we'd like you to understand that the technology does not allow for your provider to perform an examination, and thus may limit your provider's ability to fully assess your condition. If your provider identifies any concerns that need to be evaluated in person, we will make arrangements to do so.  Finally, though the technology is pretty good, we cannot assure that it will always work on either your or our end, and in the setting of a video visit, we may have to convert it to a phone-only visit.  In either situation, we cannot ensure that we have a secure connection.  Are you willing to proceed?" STAFF: Did the patient verbally acknowledge consent to telehealth visit? Document YES/NO here: YES     5. Advise patient to be prepared - "Two hours prior to your appointment, go ahead and check your blood pressure, pulse, oxygen saturation, and your weight (if you have the equipment to check those) and write them all down. When your visit starts, your provider will ask you for this information. If you have an Apple Watch or Kardia device, please plan to have heart rate information ready on the day of your appointment. Please have a pen and paper handy nearby the day of the visit as well."  6. Give patient instructions for MyChart download to smartphone OR Doximity/Doxy.me as below if video visit (depending on what platform provider is using)  7. Inform patient they will receive a phone call 15 minutes prior to their appointment time (may be from unknown caller ID) so they should be prepared to answer    TELEPHONE CALL NOTE  Donald [name] has been deemed a candidate for a follow-up tele-health visit to limit community exposure during the Covid-19 pandemic. I spoke with the patient via phone to ensure availability of phone/video source, confirm preferred email & phone number, and discuss instructions and expectations.  I reminded Donald [name] to be prepared with any vital sign and/or heart rhythm information that could potentially be obtained via home monitoring, at the time of his visit. I reminded Donald [name] to expect a phone call prior  to his visit.  Lynnda Child Slaughter 03/07/2019 9:02 AM   INSTRUCTIONS FOR DOWNLOADING THE MYCHART APP TO SMARTPHONE  - The patient must first make sure to have activated MyChart and know their login information - If Apple, go to [employer] and type in MyChart in the search bar and download the app. If Android, ask patient to go to [employer] and type in [facility] in the search bar and download the app. The app is free but as with any other app downloads, their phone may require them to verify saved payment information or Apple/Android  password.  - The patient will need to then log into the app with their MyChart username and password, and select [facility] as their healthcare provider to link the account. When it is time for your visit, go to the MyChart app, find appointments, and click Begin Video Visit. Be sure to Select Allow for your device to access the Microphone and Camera for your visit. You will then be connected, and your provider will be with you shortly.  **If they have any issues connecting, or need assistance please contact MyChart service desk [phone])**  **If using a computer, in order to ensure the best quality for their visit they will need to use either of the following Internet Browsers: [employer], or Google Chrome**  IF USING DOXIMITY or DOXY.ME - The patient will receive a link just prior to their visit by text.     FULL LENGTH CONSENT FOR TELE-HEALTH VISIT   I hereby voluntarily request, consent and authorize [facility] and its employed or contracted physicians, physician assistants, nurse practitioners or other licensed health care professionals (the Practitioner), to provide me with telemedicine health care services (the Services") as deemed necessary by the treating Practitioner. I acknowledge and consent to receive the Services by the Practitioner via telemedicine. I understand that the telemedicine visit will involve communicating with the Practitioner through live audiovisual communication technology and the disclosure of certain medical information by electronic transmission. I acknowledge that I have been given the opportunity to request an in-person assessment or other available alternative prior to the telemedicine visit and am voluntarily participating in the telemedicine visit.  I understand that I have the right to withhold or withdraw my consent to the use of telemedicine in the course of my care at any time, without affecting my right to future care or treatment,  and that the Practitioner or I may terminate the telemedicine visit at any time. I understand that I have the right to inspect all information obtained and/or recorded in the course of the telemedicine visit and may receive copies of available information for a reasonable fee.  I understand that some of the potential risks of receiving the Services via telemedicine include:   Delay or interruption in medical evaluation due to technological equipment failure or disruption;  Information transmitted may not be sufficient (e.g. poor resolution of images) to allow for appropriate medical decision making by the Practitioner; and/or   In rare instances, security protocols could fail, causing a breach of personal health information.  Furthermore, I acknowledge that it is my responsibility to provide information about my medical history, conditions and care that is complete and accurate to the best of my ability. I acknowledge that Practitioner's advice, recommendations, and/or decision may be based on factors not within their control, such as incomplete or inaccurate data provided by me or distortions of diagnostic images or specimens that may result from electronic transmissions.  I understand that the practice of medicine is not an exact science and that Practitioner makes no warranties or guarantees regarding treatment outcomes. I acknowledge that I will receive a copy of this consent concurrently upon execution via email to the email address I last provided but may also request a printed copy by calling the office of [facility].    I understand that my insurance will be billed for this visit.   I have read or had this consent read to me.  I understand the contents of this consent, which adequately explains the benefits and risks of the Services being provided via telemedicine.   I have been provided ample opportunity to ask questions regarding this consent and the Services and have had my questions  answered to my satisfaction.  I give my informed consent for the services to be provided through the use of telemedicine in my medical care  By participating in this telemedicine visit I agree to the above.

## 2019-03-08 ENCOUNTER — Telehealth (INDEPENDENT_AMBULATORY_CARE_PROVIDER_SITE_OTHER): Payer: Self-pay | Admitting: Cardiology

## 2019-03-08 ENCOUNTER — Encounter: Payer: Self-pay | Admitting: Cardiology

## 2019-03-08 VITALS — Ht 70.0 in | Wt 205.0 lb

## 2019-03-08 DIAGNOSIS — I340 Nonrheumatic mitral (valve) insufficiency: Secondary | ICD-10-CM

## 2019-03-08 DIAGNOSIS — I5022 Chronic systolic (congestive) heart failure: Secondary | ICD-10-CM

## 2019-03-08 NOTE — Progress Notes (Signed)
Virtual Visit via Telephone Note   This visit type was conducted due to national recommendations for restrictions regarding the COVID-19 Pandemic (e.g. social distancing) in an effort to limit this patient's exposure and mitigate transmission in our community.  Due to his co-morbid illnesses, this patient is at least at moderate risk for complications without adequate follow up.  This format is felt to be most appropriate for this patient at this time.  The patient did not have access to video technology/had technical difficulties with video requiring transitioning to audio format only (telephone).  All issues noted in this document were discussed and addressed.  No physical exam could be performed with this format.  Please refer to the patient's chart for his  consent to telehealth for [facility].   Date:  03/08/2019   ID:  Donald [name], Donald [name] July 09, 1985, MRN [id number]  Patient Location: Home Provider Location: Office  PCP:  Patient, No Pcp Per  Cardiologist:  Dina Rich, MD  Electrophysiologist:  None   Evaluation Performed:  Follow-Up Visit  Chief Complaint:  Follow up visit  History of Present Illness:    Donald [name] is a 33 y.o. male seen today for follow up of the following medical problems.   1. Mitral regurgitation - admit 05/2018 with acute respiratory failure. From notes had cardaic arrest with induction for intubation.  - TTE done showed severe MR - TEE showed severe prolapse and possible flail A3 scallop with severe MR.   - 06/07/2018 went to OR for MV repair with resection of ruptured anterior papillary muscle,reconstruction of papillary chord with two Neo-chords and placement of Simplici-T Annuloplasty ring via superior septal approach - Intraop/Postop TEE reports no MR initially after surgery   07/2018 TEE during repeat admission mobile echodensity posterior leaflet, possible surgical suture, vs torn chordae vs vegetation. The MR is not  reported regardign severity, ERO 0.93 would suggest severe, unclear  - CT surgery at that time recommended continued medical therapy, reconsider repeat intervention at ouptatient followup - thought perhaps an autoimmune disease may have causes some myxomatous degeneration. Undergoing rheum evaluation     -no recent SOB or DOE. No edema. We recently lowered his lasix dosing due to orthostatic symtpoms which have resolved.     2.Chronic systolic HF - readmitted 07/2018 with volume overload - multiple TTE's and TEEs as reported below. Last LVEF 35-40% by TTE in 07/2018,LVIDd 5.6 and LVIDs 4.3, mild LAE.  - discharged on Lasix 40mg  daily, lisinopril 5mg , TOprol 25mg , ASA 81, atorva 10,    - some signs of orthostasis at last visit, we lowered his lasix to 20mg  daily.   - no recent edema, no edema. Dizzienss has resolved.    3. Automimmunedisease - [facility]and [facility] had signs of bilateral SI joint scerlosis, possibly fistulous tract to rectum Concern for possible seronegative spondyloarhtritis and or IBD.  4. Left coronary cusp density - chronic byechoimaging.    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    Past Medical History:  Diagnosis Date  . Anemia   . Autoimmune disorder (HCC)    pyoderma gangrenosum  . Chronic  systolic heart failure (HCC)   . DVT (deep venous thrombosis) (HCC)    h/o  . Mitral regurgitation   . Myocardial infarction (HCC)   . Seronegative spondylitis (HCC)    arthritis   Past Surgical History:  Procedure Laterality Date  . HERNIA REPAIR Right 2018  . MITRAL VALVE REPLACEMENT  06/07/2018   [facility]     No outpatient medications have been marked as taking for the 03/08/19 encounter (Appointment) with [name], MD.     Allergies:   Patient has no known allergies.   Social History   Tobacco Use  . Smoking status: Former Games developer  . Smokeless tobacco: Never Used  Substance Use Topics  . Alcohol use: No  . Drug use: No     Family Hx: The patient's family history includes Depression in his father; Diabetes in his father and paternal grandmother; Multiple sclerosis in his mother; Psoriasis in his mother.  ROS:   Please see the history of present illness.     All other systems reviewed and are negative.   Prior CV studies:   The following studies were reviewed today:  06/06/18 Echo [facility] SUMMARY Mild left ventricular hypertrophy The left ventricle is mildly dilated. The apex is contracting normally. The rest of LV wall is hypokinetic. LV ejection fraction = 35-40%.  Left ventricular systolic function is moderately reduced. The right ventricle is normal in size and function. There is severe mitral regurgitation. There is mild mitral valve thickening. Suspect anterior mitral valve prolapse. Recommend TEE for further evaluation. Normal IVC size with decreased respiratory collapse. There is no pericardial effusion. There is no comparison study available.   06/06/2018 TEE [facility] SUMMARY There is severe prolapse (possible flail) of A3 scallop resulting in severe,  posteriorly directed regurgitation with a 0.5 cm gap in closure. Pulmonary  vein flow shows systolic reversal. Papillary muscles appear intact. No  vegetations seen. The left ventricular size is normal.  Left ventricular systolic function is mildly reduced. The right ventricle is normal in size and function. Diffuse thickening of the aortic valve with preserved cusp opening. There is  an extremely small ( <2 mm) echodensity on the ventricular side of the aortic  valve--no significant  aortic regurgitation; recommend clinical correlation. There is mild tricuspid regurgitation. No overt vegetations identified. 3D Echocardiography was performed and reviewed for assessment of cardiac  structure and function using a workstation. Findings were integrated into the  echo report.  06/07/18 Intraop TEE Pre-Intervention Summary An Omniplane TEE probe was inserted orally. A full TEE exam was performed  including 2D, M-Mode, Color and Doppler. The TEE probe was placed  atraumatically on the first attempt. The left ventricle is severely dilated. There is normal left ventricular wall  thickness. Left ventricular systolic function is moderately reduced. The EF  is 30-35%. There is mild to moderate global hypokinesis of the left  ventricle. There is moderate to severe inferior wall hypokinesis. The right ventricle is moderate to severely dilated. The right ventricular  systolic function is mildly reduced. The interatrial septum is intact with no evidence for an atrial septal  defect. The left atrium is severely dilated. Right atrial size is normal. There is a ruptured chordae with flail of the A3 segment of the anterior  mitral leaflet. No significant mitral valve stenosis. There is severe mitral  regurgitation. Flow reversal noted in pulmonary veins consistent with  significant mitral regurgitation. The mitral regurgitant jet is posteriorly  directed, which is consistent with  anterior leaflet pathology. The tricuspid valve is normal in structure and function. There is trace  tricuspid regurgitation. The aortic valve is trileaflet. The aortic valve opens well. There is a small  irregularity on the left coronary cusp adjacent to the right coronary cusp.  Cannot exclude aortic valvular vegetation. No hemodynamically significant  valvular aortic stenosis. Trace to mild AI with central jet. The pulmonic valve is normal in structure and function. > The aortic root is normal size. No  obvious dissection could be visualized.  There is no pericardial effusion. There is no pleural effusion.  Post-Intervention Summary The EF remains 30-35%. There is moderate to severe inferior wall hypokinesis. The right ventricular systolic function is normal. The interatrial septum is intact with no evidence for an atrial septal defect. There is no mitral regurgitation noted. An annuloplasty ring is noted in the  mitral position. There is trace tricuspid regurgitation. Trace to mild AI. The pulmonic valve is not well visualized. No obvious dissection could be visualized. There is no pericardial effusion.  There is no pleural effusion. MMode/2D Measurements &Calculations EDV(MOD-sp4): 72.3 ml ESV(MOD-sp4): 117.0 ml EDV(MOD-sp2): 257.0 ml ESV(MOD-sp2): 150.0 ml SV(MOD-sp4): -44.7 ml Pre-Intervention Doppler Measurements and Calculations MV max PG: 11.1 mmHg MV mean PG: 4.8 mmHg Ao V2 max: 87.0 cm/sec Ao max PG: 3.0 mmHg Ao V2 mean: 51.9 cm/sec Ao mean PG: 1.4 mmHg Ao V2 VTI: 13.4 cm   Overall TEE Interpretation Summary Pre-intervention exam: The LV is severey dilated with mild to moderate global  hypokinesis. The EF is 30-35%. There is moderate to severe hypokinesis of the  inferior wall. The RV systolic function is mildly reduced. There is a  ruptured chordae and flail of the A3 segment of the anterior mitral leaflet.  The posterior leaflet appears normal. Severe MR with posteriorly directed jet  and systolic flow reversal in the pulmonary veins. There is a small echodense  irregularity on the left coronary cusp of the aortic valve adjacent to the  right coronary cusp. Vegetation cannot be ruled out. There is trace to mild  AI with a central jet. There is trace TR. There is no obvious aortic  dissection. There is no pericardial effusion. There is no pleural effusion. Post-intervention exam: The LV EF remains unchanged from the pre-intervention  exam. The RV systolic  function has improved and is now normal. There is an  annuloplasty ring in the mitral position. There is no MR. Otherwise the exam  is unchanged from the pre-intervention exam. At the conclusion of the  procedure the probe was removed. There were no apparant complications related  to the TEE examination.   Jun 18 2018 echo SUMMARY There is normal left ventricular wall thickness. The left ventricle is mildly dilated.  Left ventricular systolic function is mildly reduced. LV ejection fraction = 40-45%. Abnormal (paradoxical) septal motion consistent with post-operative status. There is hypokinesis of the basal to mid inferior wall. The right ventricle is normal in size and function. The left atrial size is normal. An annuloplasty ring is noted in the mitral position. The mean gradient across the mitral valve is 7.6 mmHg. There is no mitral regurgitation noted. The aortic sinus is normal in size. IVC size was normal. There is no pericardial effusion. Compared to the last surface echo dated 06/06/18, the LV systolic function is  slightly improved and the patient is s/p mitral valve repair.  Jul 28 2018 TTE SUMMARY The left ventricle is mildly dilated.  Left ventricular systolic function is moderately  reduced. LV ejection fraction = 35-40%. The right ventricle is normal size. The right ventricular systolic function is mildly reduced. The left atrium is mildly dilated. There is mild aortic regurgitation. Status post mitral valve repair with annuloplasty ring There is mild to moderate mitral regurgitation. The mean gradient across the mitral valve is 9.8 mmHg. The heart rate for the mean mitral valve gradient is 92 BPM. There is mild tricuspid regurgitation. There was insufficient TR detected to calculate RV systolic pressure. Estimated right atrial pressure is 10 mmHg.Marland Kitchen There is no pericardial effusion.   Jul 28 2018 TEE - SUMMARY s/p Mitral valve repair with resection  of ruptured anterior papillary muscle,  reconstruction of papillary chord with two Neo-chords and placement of  Simplici-T Annuloplasty ring. Noted a mobile echodensity on the posterior leaflet of the mitral valve,  which may represents surgical suture. Differential include torn mitral valve  chordae or vegetation. Compared to the post-op TEE, MR appears worse. Clinical correlation advised. There is mild aortic regurgitation. A mobile echodensity noted on the left coronary cusp of the aortic valve  which was seen in the previous TEEs. Noted mild eccentric AI.   Jan 2020 CT scan 1. Soft tissue density in the anterior mediastinum adjacent to the median sternotomy which may simply be postoperative, but cannot exclude cellulitis. No drainable fluid collection. 2. Trace hydropneumopericardium as well as small locules of air in the anterior mediastinum inferiorly, most likely postoperative in nature. 3. Significant improved aeration when compared to prior study with residual atelectasis/scarring in the right lower lobe. There is residual airspace consolidation left lower lobe consistent with resultant discoid atelectasis or pneumonia. 4. . Filling defect in the right jugular vein extending into the right brachiocephalic vein as well as a more broad-based filling defect in the left brachiocephalic vein, consistent with thrombus. 5. Interval sternotomy and mitral valve replacement with expected postoperative changes.   06/07/18 surgery note PREOPERATIVE DIAGNOSIS: ICD-10 I34.0 Mitral valve insufficiency  POSTOPERATIVE DIAGNOSIS: same  QUALITY MEASURES: 4047F Documentation of order for prophylactic antibiotics to be given within one hour (if fluoroquinolone or vancomycin, two hours) prior to surgical incision (or start of procedure when no incision is required) 4041F Documentation of order for cefazolin or cefuroxime for antimicrobial prophylaxis  HEMODYNAMICS AND CATH: Ejection fraction:  35%  VALVE DATA: Mitral Valve: Severe Insufficiency  PRIORITY: Emergent  INCIDENCE: First cardiovascular surgery   PROCEDURE: CPT Codes: 81191 * Mitral valve repair with resection of ruptured anterior papillary muscle, reconstruction of papillary chord with two Neo-chords and placement of Simplici-T Annuloplasty ring via superior septal approach Model: 670 Serial: [id number]  CARDIOPULMONARY BYPASS TIME: 131 min  AORTIC CROSS CLAMP TIME: 92 min  DESCRIPTION OF OPERATION After induction of anesthesia, the patient was prepped and draped. Before the incision was made, time-out was observed by all members of the surgical and anesthesia teams to identify the correct patient and procedure. The heart was exposed via a full conventional sternotomy. The anatomy of the heart and great vessels was normal. On palpation the aorta was normal.   Intraoperative transesophageal echocardiography showed the left ventricular function to be hypokinetic with an ejection fraction of 35%. Exam of the cardiac valves showed severe mitral regurgitation with anterior A2/A3 leaflet prolapse.  After heparinization, cardiopulmonary bypass was instituted. Arterial perfusion was via an arterial cannula placed in the aorta. For assisted venous return, drainage was from two cannuli placed through the right atrium into the superior and inferior vena cavi. Caval tapes were placed. The  patient was cooled to a body core temperature of 36 degrees centigrade. The left ventricle was vented through the both aorta and right superior pulmonary vein.  The ascending aorta was cross-clamped and the heart was arrested with cold DelNido cardioplegia given antegrade. Once dose was adequate for the aforementioned cross clamp time. Topical cooling was used.  The mitral valve was exposed via a superior septal approach incision. The left atrium was carefully examined. There was no clot present. Retraction sutures were placed and the valve was  well visualized. The valve was bicuspid and was diseased from an apparent degenerative etiology. The calcification of the leaflets was none and no calcification of the annulus. The valve was inspected and the defect found to be ruptured anterior papillary muscle causing the A2/A3 prolapse. The anterior leaflet was thickened. There were no vegetations or stigmata of endocarditis. The valve was amenable to repair.  The ruptured papillary muscle was resected. The chord was then reconstructed utilizing two 5-0 Neo-chords. The valve was statically tested and was found to be competent without leak.  Thirteen sutures of 2-0 dacron were placed in simple mattress fashion in the annulus from trigone to trigone posteriorly. A Medtronic Simplici T band was chosen and the sutures passed through the band. The band was seated and the sutures secured via CorKnots. The valve was again statically tested and was found to have a small leak at the medial commissure. A commisuroplasty was performed utilizing 5-0 was performed. The valve then appeared to be hydrostatic.   The left ventricular vent was placed across the mitral valve. The left atrial and interatrial septal portions of the incisions were closed with running 4-0 Prolene. An aortic vent was placed to low suction and the cross clamp released. The right atrial portion of the incision was closed with a 4-0 Prolene suture. Caval tapes were released.  The patient was rewarmed. Cardioversion occurred spontaneously. The underlying rhythm was normal sinus. Atrial and ventricular pacing was used. The patient was weaned and separated from cardiopulmonary bypass. After discontinuation of cardiopulmonary bypass the heart adequately supported the circulation. Inotropes were used upon leaving the operating room. The left ventricular function was moderatly hypokinetic. The right ventricular function was mildly hypokinetic. Post procedure intraoperative transesophageal echocardiogram  demonstrated an ejection fraction of 35% and there was well-seated annuloplasty ring with no mitral regurgitation.  The bypass lines were removed and the Heparin reversed with Protamine. The pericardium was drained with one 24 Fr. Blake drain. The pleural space did not require a chest tube. The sternum was reapproximated with #7 and double stranded stainless steel wires. The linea alba was closed with #2 absorbable sutures. The pectoralis fascia was closed with #0 absorbable sutures. Subcutaneous tissues were closed with 2-0 absorbable suture. The skin was closed with 3-0 absorbable subcuticular suture. Sterile dressings were placed over the wounds.  There were no complications and sponge, instrument, and needle counts were reported as correct. The patient was transported to the Intensive Care Unit in stable condition.  Labs/Other Tests and Data Reviewed:    EKG:  No ECG reviewed.  Recent Labs: No results found for requested labs within last 8760 hours.   Recent Lipid Panel No results found for: CHOL, TRIG, HDL, CHOLHDL, LDLCALC, LDLDIRECT  Wt Readings from Last 3 Encounters:  01/27/19 197 lb 12.8 oz (89.7 kg)  12/06/18 200 lb (90.7 kg)  09/21/18 190 lb (86.2 kg)     Objective:    Vital Signs:   Today's Vitals   03/08/19  1007  Weight: 205 lb (93 kg)  Height: 5\' 10"  (1.778 m)   Body mass index is 29.41 kg/m.  Normal affect. Normal speech pattern and tone. Comfortable, no apparent distress.   ASSESSMENT & PLAN:    1. Mitral regurgitation - complex history as described above with recent repair, appears to have recurrent MR.  - [facility], would get here if not done by our next f/u - continue current meds    2. Chronic systolic HF - LVEF 35-40% by last echo -no recent symptoms - at recent CT surgery visit HR 60, would not titrate beta blocker. I have not adjusted his ACE or changed to entresto as of yet, with COVID-19 would look to limit  health exposures. - If [facility] shows ongoing dysfunction would change to entresto at that time.      COVID-19 Education: The signs and symptoms of COVID-19 were discussed with the patient and how to seek care for testing (follow up with PCP or arrange E-visit).  The importance of social distancing was discussed today.  Time:   Today, I have spent 15 minutes with the patient with telehealth technology discussing the above problems.     Medication Adjustments/Labs and Tests Ordered: Current medicines are reviewed at length with the patient today.  Concerns regarding medicines are outlined above.   Tests Ordered: No orders of the defined types were placed in this encounter.   Medication Changes: No orders of the defined types were placed in this encounter.   Follow Up:  Virtual Visit in 2 month(s)  Signed, Dina RichBranch, Talulah Schirmer, MD  03/08/2019 10:06 AM    [facility]

## 2019-03-08 NOTE — Patient Instructions (Signed)
Your physician recommends that you schedule a follow-up appointment in: [place] PHONE  Your physician recommends that you continue on your current medications as directed. Please refer to the Current Medication list given to you today.  Thank you for choosing [facility]!!

## 2019-05-23 ENCOUNTER — Telehealth: Payer: Self-pay | Admitting: Cardiology

## 2019-05-23 NOTE — Progress Notes (Unsigned)
{Choose 1 Note Type (Telehealth Visit or Telephone Visit):[phone]}   Date:  05/23/2019   ID:  Donald [name], DOB Jun 10, 1986, MRN [id number]  {Patient Location:[phone]::"Home"} {Provider Location:([phone]::"Home"}  PCP:  Patient, No Pcp Per  Cardiologist:  Dina Rich, MD *** Electrophysiologist:  None   Evaluation Performed:  {Choose Visit Type:[phone]::"Follow-Up Visit"}  Chief Complaint:  ***  History of Present Illness:    Donald [name] is a 33 y.o. male with ***  seen today for follow up of the following medical problems.  1. Mitral regurgitation - admit 05/2018 with acute respiratory failure. From notes had cardaic arrest with induction for intubation.  - TTE done showed severe MR - TEE showed severe prolapse and possible flail A3 scallop with severe MR.   - 06/07/2018 went to OR for MV repair with resection of ruptured anterior papillary muscle,reconstruction of papillary chord with two Neo-chords and placement of Simplici-T Annuloplasty ring via superior septal approach - Intraop/Postop TEE reports no MR initially after surgery   07/2018 TEE during repeat admission mobile echodensity posterior leaflet, possible surgical suture, vs torn chordae vs vegetation. The MR is not reported regardign severity, ERO 0.93 would suggest severe, unclear  - CT surgery at that time recommended continued medical therapy, reconsider repeat intervention at ouptatient followup - thought perhaps an autoimmune disease may have causes some myxomatous degeneration. Undergoing rheum evaluation     -no recent SOB or DOE. No edema. We recently lowered his lasix dosing due to orthostatic symtpoms which have resolved.     2.Chronic systolic HF - readmitted 07/2018 with volume overload - multiple TTE's and TEEs as reported below. Last LVEF 35-40% by TTE in 07/2018,LVIDd 5.6 and LVIDs 4.3, mild LAE.  - discharged on Lasix  daily, lisinopril , TOprol  , ASA 81, atorva 10,    - some signs of orthostasis at last visit, we lowered his lasix to  daily.   - no recent edema, no edema. Dizzienss has resolved.    **NEEDS REPEAT ECHO  3. 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  4. Left coronary cusp density - chronic byechoimaging.    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    Past Medical History:  Diagnosis Date  . Anemia   . Autoimmune disorder (HCC)    pyoderma gangrenosum  . Chronic systolic heart failure (HCC)   . DVT (deep venous thrombosis) (HCC)    h/o  . Mitral regurgitation   . Myocardial infarction (HCC)   . Seronegative spondylitis (HCC)    arthritis   Past Surgical History:  Procedure Laterality Date  . HERNIA REPAIR Right 2018  . MITRAL VALVE REPLACEMENT  06/07/2018   [facility]     No outpatient medications have been marked as taking for the 05/23/19 encounter (Appointment) with [name], MD.     Allergies:   Patient has no known allergies.   Social History   Tobacco Use  . Smoking status: Former Games developer  . Smokeless tobacco: Never Used  Substance  Use Topics  . Alcohol use: No  . Drug use: No     Family Hx: The patient's family history includes Depression in his father; Diabetes in his father and paternal grandmother; Multiple sclerosis in his mother; Psoriasis in his mother.  ROS:   Please see the history of present illness.    *** All other systems reviewed and are  negative.   Prior CV studies:   The following studies were reviewed today:  06/06/18 Echo [facility] SUMMARY Mild left ventricular hypertrophy The left ventricle is mildly dilated. The apex is contracting normally. The rest of LV wall is hypokinetic. LV ejection fraction = 35-40%.  Left ventricular systolic function is moderately reduced. The right ventricle is normal in size and function. There is severe mitral regurgitation. There is mild mitral valve thickening. Suspect anterior mitral valve prolapse. Recommend TEE for further evaluation. Normal IVC size with decreased respiratory collapse. There is no pericardial effusion. There is no comparison study available.   06/06/2018 TEE [facility] SUMMARY There is severe prolapse (possible flail) of A3 scallop resulting in severe,  posteriorly directed regurgitation with a 0.5 cm gap in closure. Pulmonary  vein flow shows systolic reversal. Papillary muscles appear intact. No  vegetations seen. The left ventricular size is normal.  Left ventricular systolic function is mildly reduced. The right ventricle is normal in size and function. Diffuse thickening of the aortic valve with preserved cusp opening. There is  an extremely small ( <2 mm) echodensity on the ventricular side of the aortic  valve--no significant aortic regurgitation; recommend clinical correlation. There is mild tricuspid regurgitation. No overt vegetations identified. 3D Echocardiography was performed and reviewed for assessment of cardiac  structure and function using a workstation. Findings were integrated into the  echo report.  06/07/18 Intraop TEE Pre-Intervention Summary An Omniplane TEE probe was inserted orally. A full TEE exam was performed  including 2D, M-Mode, Color and Doppler. The TEE probe was placed  atraumatically on the first attempt. The left ventricle is severely dilated. There is normal left ventricular wall  thickness. Left ventricular systolic  function is moderately reduced. The EF  is 30-35%. There is mild to moderate global hypokinesis of the left  ventricle. There is moderate to severe inferior wall hypokinesis. The right ventricle is moderate to severely dilated. The right ventricular  systolic function is mildly reduced. The interatrial septum is intact with no evidence for an atrial septal  defect. The left atrium is severely dilated. Right atrial size is normal. There is a ruptured chordae with flail of the A3 segment of the anterior  mitral leaflet. No significant mitral valve stenosis. There is severe mitral  regurgitation. Flow reversal noted in pulmonary veins consistent with  significant mitral regurgitation. The mitral regurgitant jet is posteriorly  directed, which is consistent with anterior leaflet pathology. The tricuspid valve is normal in structure and function. There is trace  tricuspid regurgitation. The aortic valve is trileaflet. The aortic valve opens well. There is a small  irregularity on the left coronary cusp adjacent to the right coronary cusp.  Cannot exclude aortic valvular vegetation. No hemodynamically significant  valvular aortic stenosis. Trace to mild AI with central jet. The pulmonic valve is normal in structure and function. > The aortic root is normal size. No obvious dissection could be visualized.  There is no pericardial effusion. There is no pleural effusion.  Post-Intervention Summary The EF remains 30-35%. There is moderate to severe inferior wall hypokinesis. The right ventricular systolic function is  normal. The interatrial septum is intact with no evidence for an atrial septal defect. There is no mitral regurgitation noted. An annuloplasty ring is noted in the  mitral position. There is trace tricuspid regurgitation. Trace to mild AI. The pulmonic valve is not well visualized. No obvious dissection could be visualized. There is no pericardial effusion.  There is no pleural  effusion. MMode/2D Measurements &Calculations EDV(MOD-sp4): 72.3 ml ESV(MOD-sp4): 117.0 ml EDV(MOD-sp2): 257.0 ml ESV(MOD-sp2): 150.0 ml SV(MOD-sp4): -44.7 ml Pre-Intervention Doppler Measurements and Calculations MV max PG: 11.1 mmHg MV mean PG: 4.8 mmHg Ao V2 max: 87.0 cm/sec Ao max PG: 3.0 mmHg Ao V2 mean: 51.9 cm/sec Ao mean PG: 1.4 mmHg Ao V2 VTI: 13.4 cm   Overall TEE Interpretation Summary Pre-intervention exam: The LV is severey dilated with mild to moderate global  hypokinesis. The EF is 30-35%. There is moderate to severe hypokinesis of the  inferior wall. The RV systolic function is mildly reduced. There is a  ruptured chordae and flail of the A3 segment of the anterior mitral leaflet.  The posterior leaflet appears normal. Severe MR with posteriorly directed jet  and systolic flow reversal in the pulmonary veins. There is a small echodense  irregularity on the left coronary cusp of the aortic valve adjacent to the  right coronary cusp. Vegetation cannot be ruled out. There is trace to mild  AI with a central jet. There is trace TR. There is no obvious aortic  dissection. There is no pericardial effusion. There is no pleural effusion. Post-intervention exam: The LV EF remains unchanged from the pre-intervention  exam. The RV systolic function has improved and is now normal. There is an  annuloplasty ring in the mitral position. There is no MR. Otherwise the exam  is unchanged from the pre-intervention exam. At the conclusion of the  procedure the probe was removed. There were no apparant complications related  to the TEE examination.   Jun 18 2018 echo SUMMARY There is normal left ventricular wall thickness. The left ventricle is mildly dilated.  Left ventricular systolic function is mildly reduced. LV ejection fraction = 40-45%. Abnormal (paradoxical) septal motion consistent with post-operative status. There is hypokinesis of the basal to mid inferior  wall. The right ventricle is normal in size and function. The left atrial size is normal. An annuloplasty ring is noted in the mitral position. The mean gradient across the mitral valve is 7.6 mmHg. There is no mitral regurgitation noted. The aortic sinus is normal in size. IVC size was normal. There is no pericardial effusion. Compared to the last surface echo dated 06/06/18, the LV systolic function is  slightly improved and the patient is s/p mitral valve repair.  Jul 28 2018 TTE SUMMARY The left ventricle is mildly dilated.  Left ventricular systolic function is moderately reduced. LV ejection fraction = 35-40%. The right ventricle is normal size. The right ventricular systolic function is mildly reduced. The left atrium is mildly dilated. There is mild aortic regurgitation. Status post mitral valve repair with annuloplasty ring There is mild to moderate mitral regurgitation. The mean gradient across the mitral valve is 9.8 mmHg. The heart rate for the mean mitral valve gradient is 92 BPM. There is mild tricuspid regurgitation. There was insufficient TR detected to calculate RV systolic pressure. Estimated right atrial pressure is 10 mmHg.Marland Kitchen There is no pericardial effusion.   Jul 28 2018 TEE - SUMMARY s/p Mitral valve repair with resection of ruptured anterior papillary muscle,  reconstruction of papillary chord  with two Neo-chords and placement of  Simplici-T Annuloplasty ring. Noted a mobile echodensity on the posterior leaflet of the mitral valve,  which may represents surgical suture. Differential include torn mitral valve  chordae or vegetation. Compared to the post-op TEE, MR appears worse. Clinical correlation advised. There is mild aortic regurgitation. A mobile echodensity noted on the left coronary cusp of the aortic valve  which was seen in the previous TEEs. Noted mild eccentric AI.   Jan 2020 CT scan 1. Soft tissue density in the anterior  mediastinum adjacent to the median sternotomy which may simply be postoperative, but cannot exclude cellulitis. No drainable fluid collection. 2. Trace hydropneumopericardium as well as small locules of air in the anterior mediastinum inferiorly, most likely postoperative in nature. 3. Significant improved aeration when compared to prior study with residual atelectasis/scarring in the right lower lobe. There is residual airspace consolidation left lower lobe consistent with resultant discoid atelectasis or pneumonia. 4. . Filling defect in the right jugular vein extending into the right brachiocephalic vein as well as a more broad-based filling defect in the left brachiocephalic vein, consistent with thrombus. 5. Interval sternotomy and mitral valve replacement with expected postoperative changes.   06/07/18 surgery note PREOPERATIVE DIAGNOSIS: ICD-10 I34.0 Mitral valve insufficiency  POSTOPERATIVE DIAGNOSIS: same  QUALITY MEASURES: 4047F Documentation of order for prophylactic antibiotics to be given within one hour (if fluoroquinolone or vancomycin, two hours) prior to surgical incision (or start of procedure when no incision is required) 4041F Documentation of order for cefazolin or cefuroxime for antimicrobial prophylaxis  HEMODYNAMICS AND CATH: Ejection fraction: 35%  VALVE DATA: Mitral Valve: Severe Insufficiency  PRIORITY: Emergent  INCIDENCE: First cardiovascular surgery   PROCEDURE: CPT Codes: 81017 * Mitral valve repair with resection of ruptured anterior papillary muscle, reconstruction of papillary chord with two Neo-chords and placement of Simplici-T Annuloplasty ring via superior septal approach Model: 670 Serial: [id number]  CARDIOPULMONARY BYPASS TIME: 131 min  AORTIC CROSS CLAMP TIME: 92 min  DESCRIPTION OF OPERATION After induction of anesthesia, the patient was prepped and draped. Before the incision was made, time-out was observed by all members of the  surgical and anesthesia teams to identify the correct patient and procedure. The heart was exposed via a full conventional sternotomy. The anatomy of the heart and great vessels was normal. On palpation the aorta was normal.   Intraoperative transesophageal echocardiography showed the left ventricular function to be hypokinetic with an ejection fraction of 35%. Exam of the cardiac valves showed severe mitral regurgitation with anterior A2/A3 leaflet prolapse.  After heparinization, cardiopulmonary bypass was instituted. Arterial perfusion was via an arterial cannula placed in the aorta. For assisted venous return, drainage was from two cannuli placed through the right atrium into the superior and inferior vena cavi. Caval tapes were placed. The patient was cooled to a body core temperature of 36 degrees centigrade. The left ventricle was vented through the both aorta and right superior pulmonary vein.  The ascending aorta was cross-clamped and the heart was arrested with cold DelNido cardioplegia given antegrade. Once dose was adequate for the aforementioned cross clamp time. Topical cooling was used.  The mitral valve was exposed via a superior septal approach incision. The left atrium was carefully examined. There was no clot present. Retraction sutures were placed and the valve was well visualized. The valve was bicuspid and was diseased from an apparent degenerative etiology. The calcification of the leaflets was none and no calcification of the annulus. The valve was inspected  and the defect found to be ruptured anterior papillary muscle causing the A2/A3 prolapse. The anterior leaflet was thickened. There were no vegetations or stigmata of endocarditis. The valve was amenable to repair.  The ruptured papillary muscle was resected. The chord was then reconstructed utilizing two 5-0 Neo-chords. The valve was statically tested and was found to be competent without leak.  Thirteen sutures of 2-0 dacron  were placed in simple mattress fashion in the annulus from trigone to trigone posteriorly. A Medtronic Simplici T band was chosen and the sutures passed through the band. The band was seated and the sutures secured via CorKnots. The valve was again statically tested and was found to have a small leak at the medial commissure. A commisuroplasty was performed utilizing 5-0 was performed. The valve then appeared to be hydrostatic.   The left ventricular vent was placed across the mitral valve. The left atrial and interatrial septal portions of the incisions were closed with running 4-0 Prolene. An aortic vent was placed to low suction and the cross clamp released. The right atrial portion of the incision was closed with a 4-0 Prolene suture. Caval tapes were released.  The patient was rewarmed. Cardioversion occurred spontaneously. The underlying rhythm was normal sinus. Atrial and ventricular pacing was used. The patient was weaned and separated from cardiopulmonary bypass. After discontinuation of cardiopulmonary bypass the heart adequately supported the circulation. Inotropes were used upon leaving the operating room. The left ventricular function was moderatly hypokinetic. The right ventricular function was mildly hypokinetic. Post procedure intraoperative transesophageal echocardiogram demonstrated an ejection fraction of 35% and there was well-seated annuloplasty ring with no mitral regurgitation.  The bypass lines were removed and the Heparin reversed with Protamine. The pericardium was drained with one 24 Fr. Blake drain. The pleural space did not require a chest tube. The sternum was reapproximated with #7 and double stranded stainless steel wires. The linea alba was closed with #2 absorbable sutures. The pectoralis fascia was closed with #0 absorbable sutures. Subcutaneous tissues were closed with 2-0 absorbable suture. The skin was closed with 3-0 absorbable subcuticular suture. Sterile dressings were  placed over the wounds.  There were no complications and sponge, instrument, and needle counts were reported as correct. The patient was transported to the Intensive Care Unit in stable condition.   Labs/Other Tests and Data Reviewed:    EKG:  {EKG/Telemetry Strips Reviewed:([phone]}  Recent Labs: No results found for requested labs within last 8760 hours.   Recent Lipid Panel No results found for: CHOL, TRIG, HDL, CHOLHDL, LDLCALC, LDLDIRECT  Wt Readings from Last 3 Encounters:  03/08/19 205 lb (93 kg)  01/27/19 197 lb 12.8 oz (89.7 kg)  12/06/18 200 lb (90.7 kg)     Objective:    Vital Signs:  There were no vitals taken for this visit.   {[facility] Virtual Exam (Optional):[phone]::"VITAL SIGNS:  reviewed"}  ASSESSMENT & PLAN:    1. Mitral regurgitation - complex history as described above with recent repair, appears to have recurrent MR. - [facility], would get here if not done by our next f/u - continue current meds    2. Chronic systolic HF - LVEF 35-40% by last echo -no recent symptoms -at recent CT surgery visit HR 60, would not titrate beta blocker. I have not adjusted his ACE or changed to entresto as of yet, with COVID-19 would look to limit health exposures. - [facility] shows ongoing dysfunction would change  to entresto at that time.      COVID-19 Education: The signs and symptoms of COVID-19 were discussed with the patient and how to seek care for testing (follow up with PCP or arrange E-visit).  ***The importance of social distancing was discussed today.  Time:   Today, I have spent *** minutes with the patient with telehealth technology discussing the above problems.     Medication Adjustments/Labs and Tests Ordered: Current medicines are reviewed at length with the patient today.  Concerns regarding medicines are outlined above.   Tests Ordered: No orders of the defined types were placed  in this encounter.   Medication Changes: No orders of the defined types were placed in this encounter.   Follow Up:  {F/U Format:[phone]} {follow up:15908}  Signed, Dina Rich, MD  05/23/2019 8:56 AM    [facility]

## 2019-06-19 DIAGNOSIS — L0201 Cutaneous abscess of face: Secondary | ICD-10-CM | POA: Diagnosis not present

## 2019-06-19 DIAGNOSIS — L723 Sebaceous cyst: Secondary | ICD-10-CM | POA: Diagnosis not present

## 2019-07-13 DIAGNOSIS — Z6829 Body mass index (BMI) 29.0-29.9, adult: Secondary | ICD-10-CM | POA: Diagnosis not present

## 2019-07-13 DIAGNOSIS — L720 Epidermal cyst: Secondary | ICD-10-CM | POA: Diagnosis not present

## 2019-07-13 DIAGNOSIS — Z01818 Encounter for other preprocedural examination: Secondary | ICD-10-CM | POA: Diagnosis not present

## 2019-07-15 DIAGNOSIS — L728 Other follicular cysts of the skin and subcutaneous tissue: Secondary | ICD-10-CM | POA: Diagnosis not present

## 2019-07-15 DIAGNOSIS — D2330 Other benign neoplasm of skin of unspecified part of face: Secondary | ICD-10-CM | POA: Diagnosis not present

## 2019-07-15 DIAGNOSIS — I252 Old myocardial infarction: Secondary | ICD-10-CM | POA: Diagnosis not present

## 2019-07-15 DIAGNOSIS — L720 Epidermal cyst: Secondary | ICD-10-CM | POA: Diagnosis not present

## 2019-07-15 DIAGNOSIS — I509 Heart failure, unspecified: Secondary | ICD-10-CM | POA: Diagnosis not present

## 2019-07-15 DIAGNOSIS — Z952 Presence of prosthetic heart valve: Secondary | ICD-10-CM | POA: Diagnosis not present

## 2019-08-03 DIAGNOSIS — L720 Epidermal cyst: Secondary | ICD-10-CM | POA: Diagnosis not present

## 2019-08-05 ENCOUNTER — Telehealth: Payer: Self-pay | Admitting: Cardiology

## 2019-08-05 NOTE — Telephone Encounter (Signed)
Virtual Visit Pre-Appointment Phone Call  "(Name), I am calling you today to discuss your upcoming appointment. We are currently trying to limit exposure to the virus that causes COVID-19 by seeing patients at home rather than in the office."  "What is the BEST phone number to call the day of the visit?" - i Do you have or have access to (through a family member/friend) a smartphone with video capability that we can use for your visit?" If yes - list this number in appt notes as cell (if different from BEST phone #) and list the appointment type as a VIDEO visit in appointment notes If no - list the appointment type as a PHONE visit in appointment notes  Confirm consent - "In the setting of the current Covid19 crisis, you are scheduled for a (phone or video) visit with your provider on (date) at (time).  Just as we do with many in-office visits, in order for you to participate in this visit, we must obtain consent.  If you'd like, I can send this to your mychart (if signed up) or email for you to review.  Otherwise, I can obtain your verbal consent now.  All virtual visits are billed to your insurance company just like a normal visit would be.  By agreeing to a virtual visit, we'd like you to understand that the technology does not allow for your provider to perform an examination, and thus may limit your provider's ability to fully assess your condition. If your provider identifies any concerns that need to be evaluated in person, we will make arrangements to do so.  Finally, though the technology is pretty good, we cannot assure that it will always work on either your or our end, and in the setting of a video visit, we may have to convert it to a phone-only visit.  In either situation, we cannot ensure that we have a secure connection.  Are you willing to proceed?" STAFF: Did the patient verbally ackESnowledge consent to telehealth visit? Document YES/NO here: YES  Advise patient to be prepared  - "Two hours prior to your appointment, go ahead and check your blood pressure, pulse, oxygen saturation, and your weight (if you have the equipment to check those) and write them all down. When your visit starts, your provider will ask you for this information. If you have an Apple Watch or Kardia device, please plan to have heart rate information ready on the day of your appointment. Please have a pen and paper handy nearby the day of the visit as well."  Give patient instructions for MyChart download to smartphone OR [email or web address] as below if video visit (depending on what platform provider is using)  Inform patient they will receive a phone call 15 minutes prior to their appointment time (may be from unknown caller ID) so they should be prepared to answer    TELEPHONE CALL NOTE  Donald [name] has been deemed a candidate for a follow-up tele-health visit to limit community exposure during the Covid-19 pandemic. I spoke with the patient via phone to ensure availability of phone/video source, confirm preferred email & phone number, and discuss instructions and expectations.  I reminded Donald [name] to be prepared with any vital sign and/or heart rhythm information that could potentially be obtained via home monitoring, at the time of his visit. I reminded Donald [name] to expect a phone call prior to his visit.  Donald [name] Slaughter 08/05/2019 12:19 PM   INSTRUCTIONS FOR  DOWNLOADING THE MYCHART APP TO SMARTPHONE  - The patient must first make sure to have activated MyChart and know their login information - If Apple, go to [employer] and type in MyChart in the search bar and download the app. If Android, ask patient to go to [employer] and type in [place] in the search bar and download the app. The app is free but as with any other app downloads, their phone may require them to verify saved payment information or Apple/Android password.  - The patient will need to then  log into the app with their MyChart username and password, and select [place] as their healthcare provider to link the account. When it is time for your visit, go to the MyChart app, find appointments, and click Begin Video Visit. Be sure to Select Allow for your device to access the Microphone and Camera for your visit. You will then be connected, and your provider will be with you shortly.  **If they have any issues connecting, or need assistance please contact MyChart service desk [phone])**  **If using a computer, in order to ensure the best quality for their visit they will need to use either of the following Internet Browsers: [employer] or Google Chrome**  IF USING DOXIMITY or DOXY.ME - The patient will receive a link just prior to their visit by text.     FULL LENGTH CONSENT FOR TELE-HEALTH VISIT   I hereby voluntarily request, consent and authorize [facility] and its employed or contracted physicians, physician assistants, nurse practitioners or other licensed health care professionals (the Practitioner), to provide me with telemedicine health care services (the Services") as deemed necessary by the treating Practitioner. I acknowledge and consent to receive the Services by the Practitioner via telemedicine. I understand that the telemedicine visit will involve communicating with the Practitioner through live audiovisual communication technology and the disclosure of certain medical information by electronic transmission. I acknowledge that I have been given the opportunity to request an in-person assessment or other available alternative prior to the telemedicine visit and am voluntarily participating in the telemedicine visit.  I understand that I have the right to withhold or withdraw my consent to the use of telemedicine in the course of my care at any time, without affecting my right to future care or treatment, and that the Practitioner or I may  terminate the telemedicine visit at any time. I understand that I have the right to inspect all information obtained and/or recorded in the course of the telemedicine visit and may receive copies of available information for a reasonable fee.  I understand that some of the potential risks of receiving the Services via telemedicine include:  Delay or interruption in medical evaluation due to technological equipment failure or disruption; Information transmitted may not be sufficient (e.g. poor resolution of images) to allow for appropriate medical decision making by the Practitioner; and/or  In rare instances, security protocols could fail, causing a breach of personal health information.  Furthermore, I acknowledge that it is my responsibility to provide information about my medical history, conditions and care that is complete and accurate to the best of my ability. I acknowledge that Practitioner's advice, recommendations, and/or decision may be based on factors not within their control, such as incomplete or inaccurate data provided by me or distortions of diagnostic images or specimens that may result from electronic transmissions. I understand that the practice of medicine is not an exact science and that Practitioner makes no  warranties or guarantees regarding treatment outcomes. I acknowledge that I will receive a copy of this consent concurrently upon execution via email to the email address I last provided but may also request a printed copy by calling the office of [facility].    I understand that my insurance will be billed for this visit.   I have read or had this consent read to me. I understand the contents of this consent, which adequately explains the benefits and risks of the Services being provided via telemedicine.  I have been provided ample opportunity to ask questions regarding this consent and the Services and have had my questions answered to my satisfaction. I give my  informed consent for the services to be provided through the use of telemedicine in my medical care  By participating in this telemedicine visit I agree to the above.

## 2019-08-05 NOTE — Telephone Encounter (Signed)
Virtual Visit Pre-Appointment Phone Call  "(Name), I am calling you today to discuss your upcoming appointment. We are currently trying to limit exposure to the virus that causes COVID-19 by seeing patients at home rather than in the office."  "What is the BEST phone number to call the day of the visit ([phone]  "Do you have or have access to (through a family member/friend) a smartphone with video capability that we can use for your visit?" If yes - list this number in appt notes as "cell" (if different from BEST phone #) and list the appointment type as a VIDEO visit in appointment notes If no - list the appointment type as a PHONE visit in appointment notes  Confirm consent - "In the setting of the current Covid19 crisis, you are scheduled for a (phone or video) visit with your provider on (date) at (time).  Just as we do with many in-office visits, in order for you to participate in this visit, we must obtain consent.  If you'd like, I can send this to your mychart (if signed up) or email for you to review.  Otherwise, I can obtain your verbal consent now.  All virtual visits are billed to your insurance company just like a normal visit would be.  By agreeing to a virtual visit, we'd like you to understand that the technology does not allow for your provider to perform an examination, and thus may limit your provider's ability to fully assess your condition. If your provider identifies any concerns that need to be evaluated in person, we will make arrangements to do so.  Finally, though the technology is pretty good, we cannot assure that it will always work on either your or our end, and in the setting of a video visit, we may have to convert it to a phone-only visit.  In either situation, we cannot ensure that we have a secure connection.  Are you willing to proceed?" STAFF: Did the patient verbally acknowledge consent to telehealth visit? Document YES/NO here: YES  Advise patient to be  prepared - "Two hours prior to your appointment, go ahead and check your blood pressure, pulse, oxygen saturation, and your weight (if you have the equipment to check those) and write them all down. When your visit starts, your provider will ask you for this information. If you have an Apple Watch or Kardia device, please plan to have heart rate information ready on the day of your appointment. Please have a pen and paper handy nearby the day of the visit as well."  Give patient instructions for MyChart download to smartphone OR [email or web address] as below if video visit (depending on what platform provider is using)  Inform patient they will receive a phone call 15 minutes prior to their appointment time (may be from unknown caller ID) so they should be prepared to answer    TELEPHONE CALL NOTE  Donald [name] has been deemed a candidate for a follow-up tele-health visit to limit community exposure during the Covid-19 pandemic. I spoke with the patient via phone to ensure availability of phone/video source, confirm preferred email & phone number, and discuss instructions and expectations.  I reminded Donald [name] to be prepared with any vital sign and/or heart rhythm information that could potentially be obtained via home monitoring, at the time of his visit. I reminded Donald [name] to expect a phone call prior to his visit.  Lynnda Child Slaughter 08/05/2019 12:19 PM   INSTRUCTIONS FOR  DOWNLOADING THE MYCHART APP TO SMARTPHONE  - The patient must first make sure to have activated MyChart and know their login information - If Apple, go to [employer] and type in MyChart in the search bar and download the app. If Android, ask patient to go to [employer] and type in [place] in the search bar and download the app. The app is free but as with any other app downloads, their phone may require them to verify saved payment information or Apple/Android password.  - The patient will need  to then log into the app with their MyChart username and password, and select [place] as their healthcare provider to link the account. When it is time for your visit, go to the MyChart app, find appointments, and click Begin Video Visit. Be sure to Select Allow for your device to access the Microphone and Camera for your visit. You will then be connected, and your provider will be with you shortly.  **If they have any issues connecting, or need assistance please contact MyChart service desk [phone])**  **If using a computer, in order to ensure the best quality for their visit they will need to use either of the following Internet Browsers: [employer] or Google Chrome**  IF USING DOXIMITY or DOXY.ME - The patient will receive a link just prior to their visit by text.     FULL LENGTH CONSENT FOR TELE-HEALTH VISIT   I hereby voluntarily request, consent and authorize [facility] and its employed or contracted physicians, physician assistants, nurse practitioners or other licensed health care professionals (the Practitioner), to provide me with telemedicine health care services (the "Services") as deemed necessary by the treating Practitioner. I acknowledge and consent to receive the Services by the Practitioner via telemedicine. I understand that the telemedicine visit will involve communicating with the Practitioner through live audiovisual communication technology and the disclosure of certain medical information by electronic transmission. I acknowledge that I have been given the opportunity to request an in-person assessment or other available alternative prior to the telemedicine visit and am voluntarily participating in the telemedicine visit.  I understand that I have the right to withhold or withdraw my consent to the use of telemedicine in the course of my care at any time, without affecting my right to future care or treatment, and that the Practitioner or I may  terminate the telemedicine visit at any time. I understand that I have the right to inspect all information obtained and/or recorded in the course of the telemedicine visit and may receive copies of available information for a reasonable fee.  I understand that some of the potential risks of receiving the Services via telemedicine include:  Delay or interruption in medical evaluation due to technological equipment failure or disruption; Information transmitted may not be sufficient (e.g. poor resolution of images) to allow for appropriate medical decision making by the Practitioner; and/or  In rare instances, security protocols could fail, causing a breach of personal health information.  Furthermore, I acknowledge that it is my responsibility to provide information about my medical history, conditions and care that is complete and accurate to the best of my ability. I acknowledge that Practitioner's advice, recommendations, and/or decision may be based on factors not within their control, such as incomplete or inaccurate data provided by me or distortions of diagnostic images or specimens that may result from electronic transmissions. I understand that the practice of medicine is not an exact science and that Practitioner makes no  warranties or guarantees regarding treatment outcomes. I acknowledge that I will receive a copy of this consent concurrently upon execution via email to the email address I last provided but may also request a printed copy by calling the office of [facility].    I understand that my insurance will be billed for this visit.   I have read or had this consent read to me. I understand the contents of this consent, which adequately explains the benefits and risks of the Services being provided via telemedicine.  I have been provided ample opportunity to ask questions regarding this consent and the Services and have had my questions answered to my satisfaction. I give my  informed consent for the services to be provided through the use of telemedicine in my medical care  By participating in this telemedicine visit I agree to the above.

## 2019-08-10 ENCOUNTER — Telehealth: Payer: Medicaid Other | Admitting: Cardiology

## 2019-08-10 NOTE — Progress Notes (Deleted)
Clinical Summary Donald [name] is a 34 y.o.male  seen today for follow up of the following medical problems.  1. Mitral regurgitation - admit 05/2018 with acute respiratory failure. From notes had cardaic arrest with induction for intubation.  - TTE done showed severe MR - TEE showed severe prolapse and possible flail A3 scallop with severe MR.   - 06/07/2018 went to OR for MV repair with resection of ruptured anterior papillary muscle,reconstruction of papillary chord with two Neo-chords and placement of Simplici-T Annuloplasty ring via superior septal approach - Intraop/Postop TEE reports no MR initially after surgery   07/2018 TEE during repeat admission mobile echodensity posterior leaflet, possible surgical suture, vs torn chordae vs vegetation. The MR is not reported regardign severity, ERO 0.93 would suggest severe, unclear  - CT surgery at that time recommended continued medical therapy, reconsider repeat intervention at ouptatient followup - thought perhaps an autoimmune disease may have causes some myxomatous degeneration. Undergoing rheum evaluation     -no recent SOB or DOE. No edema. We recently lowered his lasix dosing due to orthostatic symtpoms which have resolved.    2.Chronic systolic HF - readmitted 07/2018 with volume overload - multiple TTE's and TEEs as reported below. Last LVEF 35-40% by TTE in 07/2018,LVIDd 5.6 and LVIDs 4.3, mild LAE.  - discharged on Lasix 40mg  daily, lisinopril 5mg , TOprol 25mg , ASA 81, atorva 10,    - some signs of orthostasis at last visit, we loweredhis lasix to 20mg  daily.   - no recent edema, no edema. Dizzienss has resolved.   **NEEDS REPEAT ECHO  3. Automimmunedisease - [facility]and [facility] of [facility] SI joint scerlosis, possibly fistulous tract to rectum Concern for possible seronegative spondyloarhtritis and or IBD.  4. Left coronary cusp  density - chronic byechoimaging.    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 Anemia   . Autoimmune disorder (HCC)    pyoderma gangrenosum  . Chronic systolic heart failure (HCC)   . DVT (deep venous thrombosis) (HCC)    h/o  . Mitral regurgitation   . Myocardial infarction (HCC)   . Seronegative spondylitis (HCC)    arthritis     No Known Allergies   Current Outpatient Medications  Medication Sig Dispense Refill  . Adalimumab 40 MG/0.4ML PNKT Inject into the skin every 7 (seven) days.     . ASPIRIN ADULT LOW STRENGTH 81 MG EC tablet Take 81 mg by mouth daily.     Marland Kitchen atorvastatin (LIPITOR) 10 MG tablet Take 1 tablet (10 mg total) by mouth daily. 90 tablet 2  . ferrous sulfate 325 (65 FE) MG tablet Take 1 tablet (325 mg total) by mouth daily. 90 tablet 1  . furosemide (LASIX) 40 MG tablet Take 0.5 tablets (20 mg total) by mouth daily. Take 40 mg on days you have shortness of breath or swelling 90 tablet 1  . lisinopril (ZESTRIL) 10 MG tablet Take 1 tablet (10 mg total) by mouth daily. 90 tablet 1  . metoprolol succinate (TOPROL-XL) 25 MG 24 hr tablet Take 1 tablet (25 mg total) by mouth daily. 90 tablet 1  . vitamin C (ASCORBIC ACID) 250 MG tablet Take 250 mg  by mouth 2 (two) times daily.      No current facility-administered medications for this visit.     Past Surgical History:  Procedure Laterality Date  . HERNIA REPAIR Right 2018  . MITRAL VALVE REPLACEMENT  06/07/2018   [facility]     No Known Allergies    Family History  Problem Relation Age of Onset  . Multiple sclerosis Mother   . Psoriasis Mother   . Depression Father   . Diabetes Father   . Diabetes Paternal Grandmother      Social History Mr. Nee reports that  he has quit smoking. He has never used smokeless tobacco. Mr. Marlatt reports no history of alcohol use.   Review of Systems CONSTITUTIONAL: No weight loss, fever, chills, weakness or fatigue.  HEENT: Eyes: No visual loss, blurred vision, double vision or yellow sclerae.No hearing loss, sneezing, congestion, runny nose or sore throat.  SKIN: No rash or itching.  CARDIOVASCULAR:  RESPIRATORY: No shortness of breath, cough or sputum.  GASTROINTESTINAL: No anorexia, nausea, vomiting or diarrhea. No abdominal pain or blood.  GENITOURINARY: No burning on urination, no polyuria NEUROLOGICAL: No headache, dizziness, syncope, paralysis, ataxia, numbness or tingling in the extremities. No change in bowel or bladder control.  MUSCULOSKELETAL: No muscle, back pain, joint pain or stiffness.  LYMPHATICS: No enlarged nodes. No history of splenectomy.  PSYCHIATRIC: No history of depression or anxiety.  ENDOCRINOLOGIC: No reports of sweating, cold or heat intolerance. No polyuria or polydipsia.  Marland Kitchen   Physical Examination There were no vitals filed for this visit. There were no vitals filed for this visit.  Gen: resting comfortably, no acute distress HEENT: no scleral icterus, pupils equal round and reactive, no palptable cervical adenopathy,  CV Resp: Clear to auscultation bilaterally GI: abdomen is soft, non-tender, non-distended, normal bowel sounds, no hepatosplenomegaly MSK: extremities are warm, no edema.  Skin: warm, no rash Neuro:  no focal deficits Psych: appropriate affect   Diagnostic Studies  06/06/18 Echo [facility] SUMMARY Mild left ventricular hypertrophy The left ventricle is mildly dilated. The apex is contracting normally. The rest of LV wall is hypokinetic. LV ejection fraction = 35-40%.  Left ventricular systolic function is moderately reduced. The right ventricle is normal in size and function. There is severe mitral regurgitation. There is mild mitral valve  thickening. Suspect anterior mitral valve prolapse. Recommend TEE for further evaluation. Normal IVC size with decreased respiratory collapse. There is no pericardial effusion. There is no comparison study available.   06/06/2018 TEE [facility] SUMMARY There is severe prolapse (possible flail) of A3 scallop resulting in severe,  posteriorly directed regurgitation with a 0.5 cm gap in closure. Pulmonary  vein flow shows systolic reversal. Papillary muscles appear intact. No  vegetations seen. The left ventricular size is normal.  Left ventricular systolic function is mildly reduced. The right ventricle is normal in size and function. Diffuse thickening of the aortic valve with preserved cusp opening. There is  an extremely small ( <2 mm) echodensity on the ventricular side of the aortic  valve--no significant aortic regurgitation; recommend clinical correlation. There is mild tricuspid regurgitation. No overt vegetations identified. 3D Echocardiography was performed and reviewed for assessment of cardiac  structure and function using a workstation. Findings were integrated into the  echo report.  06/07/18 Intraop TEE Pre-Intervention Summary An Omniplane TEE probe was inserted orally. A full TEE exam was performed  including 2D, M-Mode, Color and Doppler. The TEE probe was placed  atraumatically on the first  attempt. The left ventricle is severely dilated. There is normal left ventricular wall  thickness. Left ventricular systolic function is moderately reduced. The EF  is 30-35%. There is mild to moderate global hypokinesis of the left  ventricle. There is moderate to severe inferior wall hypokinesis. The right ventricle is moderate to severely dilated. The right ventricular  systolic function is mildly reduced. The interatrial septum is intact with no evidence for an atrial septal  defect. The left atrium is severely dilated. Right atrial size is normal. There is a ruptured  chordae with flail of the A3 segment of the anterior  mitral leaflet. No significant mitral valve stenosis. There is severe mitral  regurgitation. Flow reversal noted in pulmonary veins consistent with  significant mitral regurgitation. The mitral regurgitant jet is posteriorly  directed, which is consistent with anterior leaflet pathology. The tricuspid valve is normal in structure and function. There is trace  tricuspid regurgitation. The aortic valve is trileaflet. The aortic valve opens well. There is a small  irregularity on the left coronary cusp adjacent to the right coronary cusp.  Cannot exclude aortic valvular vegetation. No hemodynamically significant  valvular aortic stenosis. Trace to mild AI with central jet. The pulmonic valve is normal in structure and function. > The aortic root is normal size. No obvious dissection could be visualized.  There is no pericardial effusion. There is no pleural effusion.  Post-Intervention Summary The EF remains 30-35%. There is moderate to severe inferior wall hypokinesis. The right ventricular systolic function is normal. The interatrial septum is intact with no evidence for an atrial septal defect. There is no mitral regurgitation noted. An annuloplasty ring is noted in the  mitral position. There is trace tricuspid regurgitation. Trace to mild AI. The pulmonic valve is not well visualized. No obvious dissection could be visualized. There is no pericardial effusion.  There is no pleural effusion. MMode/2D Measurements &Calculations EDV(MOD-sp4): 72.3 ml ESV(MOD-sp4): 117.0 ml EDV(MOD-sp2): 257.0 ml ESV(MOD-sp2): 150.0 ml SV(MOD-sp4): -44.7 ml Pre-Intervention Doppler Measurements and Calculations MV max PG: 11.1 mmHg MV mean PG: 4.8 mmHg Ao V2 max: 87.0 cm/sec Ao max PG: 3.0 mmHg Ao V2 mean: 51.9 cm/sec Ao mean PG: 1.4 mmHg Ao V2 VTI: 13.4 cm   Overall TEE Interpretation Summary Pre-intervention exam: The LV is severey  dilated with mild to moderate global  hypokinesis. The EF is 30-35%. There is moderate to severe hypokinesis of the  inferior wall. The RV systolic function is mildly reduced. There is a  ruptured chordae and flail of the A3 segment of the anterior mitral leaflet.  The posterior leaflet appears normal. Severe MR with posteriorly directed jet  and systolic flow reversal in the pulmonary veins. There is a small echodense  irregularity on the left coronary cusp of the aortic valve adjacent to the  right coronary cusp. Vegetation cannot be ruled out. There is trace to mild  AI with a central jet. There is trace TR. There is no obvious aortic  dissection. There is no pericardial effusion. There is no pleural effusion. Post-intervention exam: The LV EF remains unchanged from the pre-intervention  exam. The RV systolic function has improved and is now normal. There is an  annuloplasty ring in the mitral position. There is no MR. Otherwise the exam  is unchanged from the pre-intervention exam. At the conclusion of the  procedure the probe was removed. There were no apparant complications related  to the TEE examination.   Jun 18 2018 echo SUMMARY There is  normal left ventricular wall thickness. The left ventricle is mildly dilated.  Left ventricular systolic function is mildly reduced. LV ejection fraction = 40-45%. Abnormal (paradoxical) septal motion consistent with post-operative status. There is hypokinesis of the basal to mid inferior wall. The right ventricle is normal in size and function. The left atrial size is normal. An annuloplasty ring is noted in the mitral position. The mean gradient across the mitral valve is 7.6 mmHg. There is no mitral regurgitation noted. The aortic sinus is normal in size. IVC size was normal. There is no pericardial effusion. Compared to the last surface echo dated 06/06/18, the LV systolic function is  slightly improved and the patient is s/p mitral  valve repair.  Jul 28 2018 TTE SUMMARY The left ventricle is mildly dilated.  Left ventricular systolic function is moderately reduced. LV ejection fraction = 35-40%. The right ventricle is normal size. The right ventricular systolic function is mildly reduced. The left atrium is mildly dilated. There is mild aortic regurgitation. Status post mitral valve repair with annuloplasty ring There is mild to moderate mitral regurgitation. The mean gradient across the mitral valve is 9.8 mmHg. The heart rate for the mean mitral valve gradient is 92 BPM. There is mild tricuspid regurgitation. There was insufficient TR detected to calculate RV systolic pressure. Estimated right atrial pressure is 10 mmHg.Marland Kitchen There is no pericardial effusion.   Jul 28 2018 TEE - SUMMARY s/p Mitral valve repair with resection of ruptured anterior papillary muscle,  reconstruction of papillary chord with two Neo-chords and placement of  Simplici-T Annuloplasty ring. Noted a mobile echodensity on the posterior leaflet of the mitral valve,  which may represents surgical suture. Differential include torn mitral valve  chordae or vegetation. Compared to the post-op TEE, MR appears worse. Clinical correlation advised. There is mild aortic regurgitation. A mobile echodensity noted on the left coronary cusp of the aortic valve  which was seen in the previous TEEs. Noted mild eccentric AI.   Jan 2020 CT scan 1. Soft tissue density in the anterior mediastinum adjacent to the median sternotomy which may simply be postoperative, but cannot exclude cellulitis. No drainable fluid collection. 2. Trace hydropneumopericardium as well as small locules of air in the anterior mediastinum inferiorly, most likely postoperative in nature. 3. Significant improved aeration when compared to prior study with residual atelectasis/scarring in the right lower lobe. There is residual airspace consolidation left lower lobe  consistent with resultant discoid atelectasis or pneumonia. 4. . Filling defect in the right jugular vein extending into the right brachiocephalic vein as well as a more broad-based filling defect in the left brachiocephalic vein, consistent with thrombus. 5. Interval sternotomy and mitral valve replacement with expected postoperative changes.   06/07/18 surgery note PREOPERATIVE DIAGNOSIS: ICD-10 I34.0 Mitral valve insufficiency  POSTOPERATIVE DIAGNOSIS: same  QUALITY MEASURES: 4047F Documentation of order for prophylactic antibiotics to be given within one hour (if fluoroquinolone or vancomycin, two hours) prior to surgical incision (or start of procedure when no incision is required) 4041F Documentation of order for cefazolin or cefuroxime for antimicrobial prophylaxis  HEMODYNAMICS AND CATH: Ejection fraction: 35%  VALVE DATA: Mitral Valve: Severe Insufficiency  PRIORITY: Emergent  INCIDENCE: First cardiovascular surgery   PROCEDURE: CPT Codes: 24580 * Mitral valve repair with resection of ruptured anterior papillary muscle, reconstruction of papillary chord with two Neo-chords and placement of Simplici-T Annuloplasty ring via superior septal approach Model: 670 Serial: [id number]  CARDIOPULMONARY BYPASS TIME: 131 min  AORTIC CROSS CLAMP TIME: 92  min  DESCRIPTION OF OPERATION After induction of anesthesia, the patient was prepped and draped. Before the incision was made, time-out was observed by all members of the surgical and anesthesia teams to identify the correct patient and procedure. The heart was exposed via a full conventional sternotomy. The anatomy of the heart and great vessels was normal. On palpation the aorta was normal.   Intraoperative transesophageal echocardiography showed the left ventricular function to be hypokinetic with an ejection fraction of 35%. Exam of the cardiac valves showed severe mitral regurgitation with anterior A2/A3 leaflet  prolapse.  After heparinization, cardiopulmonary bypass was instituted. Arterial perfusion was via an arterial cannula placed in the aorta. For assisted venous return, drainage was from two cannuli placed through the right atrium into the superior and inferior vena cavi. Caval tapes were placed. The patient was cooled to a body core temperature of 36 degrees centigrade. The left ventricle was vented through the both aorta and right superior pulmonary vein.  The ascending aorta was cross-clamped and the heart was arrested with cold DelNido cardioplegia given antegrade. Once dose was adequate for the aforementioned cross clamp time. Topical cooling was used.  The mitral valve was exposed via a superior septal approach incision. The left atrium was carefully examined. There was no clot present. Retraction sutures were placed and the valve was well visualized. The valve was bicuspid and was diseased from an apparent degenerative etiology. The calcification of the leaflets was none and no calcification of the annulus. The valve was inspected and the defect found to be ruptured anterior papillary muscle causing the A2/A3 prolapse. The anterior leaflet was thickened. There were no vegetations or stigmata of endocarditis. The valve was amenable to repair.  The ruptured papillary muscle was resected. The chord was then reconstructed utilizing two 5-0 Neo-chords. The valve was statically tested and was found to be competent without leak.  Thirteen sutures of 2-0 dacron were placed in simple mattress fashion in the annulus from trigone to trigone posteriorly. A Medtronic Simplici T band was chosen and the sutures passed through the band. The band was seated and the sutures secured via CorKnots. The valve was again statically tested and was found to have a small leak at the medial commissure. A commisuroplasty was performed utilizing 5-0 was performed. The valve then appeared to be hydrostatic.   The left  ventricular vent was placed across the mitral valve. The left atrial and interatrial septal portions of the incisions were closed with running 4-0 Prolene. An aortic vent was placed to low suction and the cross clamp released. The right atrial portion of the incision was closed with a 4-0 Prolene suture. Caval tapes were released.  The patient was rewarmed. Cardioversion occurred spontaneously. The underlying rhythm was normal sinus. Atrial and ventricular pacing was used. The patient was weaned and separated from cardiopulmonary bypass. After discontinuation of cardiopulmonary bypass the heart adequately supported the circulation. Inotropes were used upon leaving the operating room. The left ventricular function was moderatly hypokinetic. The right ventricular function was mildly hypokinetic. Post procedure intraoperative transesophageal echocardiogram demonstrated an ejection fraction of 35% and there was well-seated annuloplasty ring with no mitral regurgitation.  The bypass lines were removed and the Heparin reversed with Protamine. The pericardium was drained with one 24 Fr. Blake drain. The pleural space did not require a chest tube. The sternum was reapproximated with #7 and double stranded stainless steel wires. The linea alba was closed with #2 absorbable sutures. The pectoralis fascia was closed  with #0 absorbable sutures. Subcutaneous tissues were closed with 2-0 absorbable suture. The skin was closed with 3-0 absorbable subcuticular suture. Sterile dressings were placed over the wounds.  There were no complications and sponge, instrument, and needle counts were reported as correct. The patient was transported to the Intensive Care Unit in stable condition.    Assessment and Plan   1. Mitral regurgitation - complex history as described above with recent repair, appears to have recurrent MR. - looks to have echo at [facility] pending, would get here if not done by our next f/u - continue  current meds    2. Chronic systolic HF - LVEF 35-40% by last echo -no recent symptoms -at recent CT surgery visit HR 60, would not titrate beta blocker. I have not adjusted his ACE or changed to entresto as of yet, with COVID-19 would look to limit health exposures. -If repeat echo at [facility] shows ongoing dysfunction would change to entresto at that time.      [name], M.D.

## 2019-08-12 ENCOUNTER — Encounter: Payer: Self-pay | Admitting: Cardiology

## 2019-09-27 DIAGNOSIS — S0183XA Puncture wound without foreign body of other part of head, initial encounter: Secondary | ICD-10-CM | POA: Diagnosis not present

## 2019-09-27 DIAGNOSIS — S0184XA Puncture wound with foreign body of other part of head, initial encounter: Secondary | ICD-10-CM | POA: Diagnosis not present

## 2019-09-27 DIAGNOSIS — S0180XA Unspecified open wound of other part of head, initial encounter: Secondary | ICD-10-CM | POA: Diagnosis not present

## 2019-09-28 DIAGNOSIS — T1511XA Foreign body in conjunctival sac, right eye, initial encounter: Secondary | ICD-10-CM | POA: Diagnosis not present

## 2019-09-28 DIAGNOSIS — Y999 Unspecified external cause status: Secondary | ICD-10-CM | POA: Diagnosis not present

## 2019-09-28 DIAGNOSIS — W228XXA Striking against or struck by other objects, initial encounter: Secondary | ICD-10-CM | POA: Diagnosis not present

## 2019-09-28 DIAGNOSIS — T1591XA Foreign body on external eye, part unspecified, right eye, initial encounter: Secondary | ICD-10-CM | POA: Diagnosis not present

## 2019-09-28 DIAGNOSIS — T1501XA Foreign body in cornea, right eye, initial encounter: Secondary | ICD-10-CM | POA: Diagnosis not present

## 2019-09-30 DIAGNOSIS — S0501XD Injury of conjunctiva and corneal abrasion without foreign body, right eye, subsequent encounter: Secondary | ICD-10-CM | POA: Diagnosis not present

## 2019-09-30 DIAGNOSIS — X58XXXD Exposure to other specified factors, subsequent encounter: Secondary | ICD-10-CM | POA: Diagnosis not present

## 2019-09-30 DIAGNOSIS — T1501XD Foreign body in cornea, right eye, subsequent encounter: Secondary | ICD-10-CM | POA: Diagnosis not present

## 2019-10-06 DIAGNOSIS — X58XXXD Exposure to other specified factors, subsequent encounter: Secondary | ICD-10-CM | POA: Diagnosis not present

## 2019-10-06 DIAGNOSIS — T1501XD Foreign body in cornea, right eye, subsequent encounter: Secondary | ICD-10-CM | POA: Diagnosis not present

## 2019-12-09 ENCOUNTER — Telehealth: Payer: Self-pay | Admitting: Cardiology

## 2019-12-09 MED ORDER — ATORVASTATIN CALCIUM 10 MG PO TABS: 10.0000 mg | ORAL_TABLET | Freq: Every day | ORAL | 0 refills | Status: DC

## 2019-12-09 MED ORDER — LISINOPRIL 10 MG PO TABS: 10.0000 mg | ORAL_TABLET | Freq: Every day | ORAL | 0 refills | Status: DC

## 2019-12-09 MED ORDER — METOPROLOL SUCCINATE ER 25 MG PO TB24: 25.0000 mg | ORAL_TABLET | Freq: Every day | ORAL | 0 refills | Status: DC

## 2019-12-09 MED ORDER — FUROSEMIDE 40 MG PO TABS: 20.0000 mg | ORAL_TABLET | Freq: Every day | ORAL | 0 refills | Status: DC

## 2019-12-09 NOTE — Telephone Encounter (Signed)
Pt. Requesting a call back because he has not been taking his medication. He said he forgot which ones he is to take. Also he needs a copy of his currents meds sent to the pharmacy

## 2019-12-09 NOTE — Telephone Encounter (Signed)
Pt requesting refill on cardiac meds to be sent into the pharmacy. Refills complete. Appt made for next office visit.

## 2019-12-09 NOTE — Telephone Encounter (Signed)
PT. CALLED BACK HE SAYS THAT HE HAD A MISSED CALL

## 2020-01-04 ENCOUNTER — Ambulatory Visit: Payer: Self-pay | Admitting: Student

## 2020-01-04 ENCOUNTER — Encounter: Payer: Self-pay | Admitting: *Deleted

## 2020-01-08 NOTE — Progress Notes (Addendum)
Cardiology Office Note  Date: 01/09/2020   ID: Donald, [name] [birth year], MRN [id number]  PCP:  Patient, No Pcp Per  Cardiologist:  Dina Rich, MD Electrophysiologist:  None   Chief Complaint: Chronic systolic heart failure  History of Present Illness: Donald [name] is a 34 y.o. male with a history of chronic systolic heart failure, nonrheumatic mitral valve regurgitation.  Admission 06/04/2018 with acute respiratory failure.  Cardiac arrest with induction for intubation.  TEE showed severe MR.  Severe prolapse and possible flail A3 scallop with severe MR.  06/07/2018 mitral valve repair with resection of ruptured anterior papillary muscle.  Recent construction of papillary cord with 2 neo-cords and placement of simplicity annuloplasty ring via superior septal approach.  Intra-Op/postop TEE reports no MR initially after surgery.  A subsequent TEE performed on February 2020 during repeat admission showed a mobile echodensity posterior leaflet, possible surgical suture, versus torn chordae versus vegetation.  ER O0.93 with suggest severe, unclear.  CT surgery at that time recommended continued medical therapy and reconsider repeat intervention and outpatient follow-up.  The thought was perhaps an autoimmune disease may have caused some myxomatous degeneration.  He was undergoing rheumatoid evaluation.  Last encounter with Dr. Wyline Mood via telemedicine on 03/08/2019.  Had no recent shortness of breath or DOE.  No edema.  His Lasix was decreased due to orthostatic symptoms previously.  He reported no further orthostatic symptoms during that visit.  Patient denies any recent anginal or exertional symptoms, palpitations or arrhythmias, orthostatic symptoms, PND, orthopnea, lower extremity edema.  States he has some weight gain since last year but nothing significant.  His weight was 205 September of last year today it remains 205.  Patient states he is going to run out of his  Humira injections.  He was asking [place] if we could refill it.  Also states he needs refills on all of his cardiac medications.  He is normotensive today with a blood pressure 124/78.  Heart rate is 61, O2 sats 98% on room air.  EKG today shows sinus rhythm with first-degree AV block rate of 61, minimal voltage criteria for LVH possible inferior infarct age undetermined cannot rule out anterior infarct age undetermined.  Patient has no primary care provider.  Advised him it would be prudent to establish with a primary care provider.  He is asking for refills on all of his cardiac medications.   Past Medical History:  Diagnosis Date   Anemia    Autoimmune disorder (HCC)    pyoderma gangrenosum   Chronic systolic heart failure (HCC)    DVT (deep venous thrombosis) (HCC)    h/o   Mitral regurgitation    Myocardial infarction (HCC)    Seronegative spondylitis (HCC)    arthritis    Past Surgical History:  Procedure Laterality Date   HERNIA REPAIR Right 2018   MITRAL VALVE REPLACEMENT  06/07/2018   [facility]    Current Outpatient Medications  Medication Sig Dispense Refill   Adalimumab 40 MG/0.4ML PNKT Inject into the skin every 7 (seven) days.      ASPIRIN ADULT LOW STRENGTH 81 MG EC tablet Take 81 mg by mouth daily.      atorvastatin (LIPITOR) 10 MG tablet Take 1 tablet (10 mg total) by mouth daily. 90 tablet 2   ferrous sulfate 325 (65 FE) MG tablet Take 1 tablet (325 mg total) by mouth daily. 90 tablet 1   furosemide (LASIX) 40 MG tablet Take 0.5 tablets (20  mg total) by mouth daily. Take 40 mg on days you have shortness of breath or swelling 135 tablet 2   lisinopril (ZESTRIL) 10 MG tablet Take 1 tablet (10 mg total) by mouth daily. 90 tablet 2   metoprolol succinate (TOPROL-XL) 25 MG 24 hr tablet Take 1 tablet (25 mg total) by mouth daily. 90 tablet 2   No current facility-administered medications for this visit.   Allergies:  Patient has no known allergies.    Social History: The patient  reports that he has quit smoking. He has never used smokeless tobacco. He reports that he does not drink alcohol and does not use drugs.   Family History: The patient's family history includes Depression in his father; Diabetes in his father and paternal grandmother; Multiple sclerosis in his mother; Psoriasis in his mother.   ROS:  Please see the history of present illness. Otherwise, complete review of systems is positive for none.  All other systems are reviewed and negative.   Physical Exam: VS:  BP 124/78    Pulse 61    Ht  (1.753 m)    Wt (!) 205 lb (93 kg)    SpO2 98%    BMI 30.27 kg/m , BMI Body mass index is 30.27 kg/m.  Wt Readings from Last 3 Encounters:  01/09/20 (!) 205 lb (93 kg)  03/08/19 205 lb (93 kg)  01/27/19 197 lb 12.8 oz (89.7 kg)    General: Patient appears comfortable at rest. Neck: Supple, no elevated JVP or carotid bruits, no thyromegaly. Lungs: Clear to auscultation, nonlabored breathing at rest. Cardiac: Regular rate and rhythm, no S3 or 2 out of 6 systolic murmur best heard at LLSB, no pericardial rub. Extremities: No pitting edema, distal pulses 2+. Skin: Warm and dry. Musculoskeletal: No kyphosis. Neuropsychiatric: Alert and oriented x3, affect grossly appropriate.  ECG:    EKG 01/09/2020 sinus rhythm with first-degree AV block rate of 61, minimal voltage criteria for LVH possible inferior infarct age undetermined cannot rule out anterior infarct age undetermined.  Recent Labwork: No results found for requested labs within last 8760 hours.  No results found for: CHOL, TRIG, HDL, CHOLHDL, VLDL, LDLCALC, LDLDIRECT  Other Studies Reviewed Today:  06/06/18 Echo [facility] SUMMARY Mild left ventricular hypertrophy The left ventricle is mildly dilated. The apex is contracting normally. The rest of LV wall is hypokinetic. LV ejection fraction = 35-40%.  Left ventricular systolic function is moderately reduced. The right  ventricle is normal in size and function. There is severe mitral regurgitation. There is mild mitral valve thickening. Suspect anterior mitral valve prolapse. Recommend TEE for further evaluation. Normal IVC size with decreased respiratory collapse. There is no pericardial effusion. There is no comparison study available.   06/06/2018 TEE [facility] SUMMARY There is severe prolapse (possible flail) of A3 scallop resulting in severe,  posteriorly directed regurgitation with a 0.5 cm gap in closure. Pulmonary  vein flow shows systolic reversal. Papillary muscles appear intact. No  vegetations seen. The left ventricular size is normal.  Left ventricular systolic function is mildly reduced. The right ventricle is normal in size and function. Diffuse thickening of the aortic valve with preserved cusp opening. There is  an extremely small ( <2 mm) echodensity on the ventricular side of the aortic  valve--no significant aortic regurgitation; recommend clinical correlation. There is mild tricuspid regurgitation. No overt vegetations identified. 3D Echocardiography was performed and reviewed for assessment of cardiac  structure and function using a workstation. Findings were integrated into  the  echo report.  06/07/18 Intraop TEE Pre-Intervention Summary An Omniplane TEE probe was inserted orally. A full TEE exam was performed  including 2D, M-Mode, Color and Doppler. The TEE probe was placed  atraumatically on the first attempt. The left ventricle is severely dilated. There is normal left ventricular wall  thickness. Left ventricular systolic function is moderately reduced. The EF  is 30-35%. There is mild to moderate global hypokinesis of the left  ventricle. There is moderate to severe inferior wall hypokinesis. The right ventricle is moderate to severely dilated. The right ventricular  systolic function is mildly reduced. The interatrial septum is intact with no evidence for an atrial  septal  defect. The left atrium is severely dilated. Right atrial size is normal. There is a ruptured chordae with flail of the A3 segment of the anterior  mitral leaflet. No significant mitral valve stenosis. There is severe mitral  regurgitation. Flow reversal noted in pulmonary veins consistent with  significant mitral regurgitation. The mitral regurgitant jet is posteriorly  directed, which is consistent with anterior leaflet pathology. The tricuspid valve is normal in structure and function. There is trace  tricuspid regurgitation. The aortic valve is trileaflet. The aortic valve opens well. There is a small  irregularity on the left coronary cusp adjacent to the right coronary cusp.  Cannot exclude aortic valvular vegetation. No hemodynamically significant  valvular aortic stenosis. Trace to mild AI with central jet. The pulmonic valve is normal in structure and function. > The aortic root is normal size. No obvious dissection could be visualized.  There is no pericardial effusion. There is no pleural effusion.  Post-Intervention Summary The EF remains 30-35%. There is moderate to severe inferior wall hypokinesis. The right ventricular systolic function is normal. The interatrial septum is intact with no evidence for an atrial septal defect. There is no mitral regurgitation noted. An annuloplasty ring is noted in the  mitral position. There is trace tricuspid regurgitation. Trace to mild AI. The pulmonic valve is not well visualized. No obvious dissection could be visualized. There is no pericardial effusion.  There is no pleural effusion. MMode/2D Measurements &Calculations EDV(MOD-sp4): 72.3 ml ESV(MOD-sp4): 117.0 ml EDV(MOD-sp2): 257.0 ml ESV(MOD-sp2): 150.0 ml SV(MOD-sp4): -44.7 ml Pre-Intervention Doppler Measurements and Calculations MV max PG: 11.1 mmHg MV mean PG: 4.8 mmHg Ao V2 max: 87.0 cm/sec Ao max PG: 3.0 mmHg Ao V2 mean: 51.9 cm/sec Ao mean PG: 1.4  mmHg Ao V2 VTI: 13.4 cm   Overall TEE Interpretation Summary Pre-intervention exam: The LV is severey dilated with mild to moderate global  hypokinesis. The EF is 30-35%. There is moderate to severe hypokinesis of the  inferior wall. The RV systolic function is mildly reduced. There is a  ruptured chordae and flail of the A3 segment of the anterior mitral leaflet.  The posterior leaflet appears normal. Severe MR with posteriorly directed jet  and systolic flow reversal in the pulmonary veins. There is a small echodense  irregularity on the left coronary cusp of the aortic valve adjacent to the  right coronary cusp. Vegetation cannot be ruled out. There is trace to mild  AI with a central jet. There is trace TR. There is no obvious aortic  dissection. There is no pericardial effusion. There is no pleural effusion. Post-intervention exam: The LV EF remains unchanged from the pre-intervention  exam. The RV systolic function has improved and is now normal. There is an  annuloplasty ring in the mitral position. There is no MR. Otherwise  the exam  is unchanged from the pre-intervention exam. At the conclusion of the  procedure the probe was removed. There were no apparant complications related  to the TEE examination.   Jun 18 2018 echo SUMMARY There is normal left ventricular wall thickness. The left ventricle is mildly dilated.  Left ventricular systolic function is mildly reduced. LV ejection fraction = 40-45%. Abnormal (paradoxical) septal motion consistent with post-operative status. There is hypokinesis of the basal to mid inferior wall. The right ventricle is normal in size and function. The left atrial size is normal. An annuloplasty ring is noted in the mitral position. The mean gradient across the mitral valve is 7.6 mmHg. There is no mitral regurgitation noted. The aortic sinus is normal in size. IVC size was normal. There is no pericardial effusion. Compared to the last  surface echo dated 06/06/18, the LV systolic function is  slightly improved and the patient is s/p mitral valve repair.  Jul 28 2018 TTE SUMMARY The left ventricle is mildly dilated.  Left ventricular systolic function is moderately reduced. LV ejection fraction = 35-40%. The right ventricle is normal size. The right ventricular systolic function is mildly reduced. The left atrium is mildly dilated. There is mild aortic regurgitation. Status post mitral valve repair with annuloplasty ring There is mild to moderate mitral regurgitation. The mean gradient across the mitral valve is 9.8 mmHg. The heart rate for the mean mitral valve gradient is 92 BPM. There is mild tricuspid regurgitation. There was insufficient TR detected to calculate RV systolic pressure. Estimated right atrial pressure is 10 mmHg.Marland Kitchen There is no pericardial effusion.   Jul 28 2018 TEE - SUMMARY s/p Mitral valve repair with resection of ruptured anterior papillary muscle,  reconstruction of papillary chord with two Neo-chords and placement of  Simplici-T Annuloplasty ring. Noted a mobile echodensity on the posterior leaflet of the mitral valve,  which may represents surgical suture. Differential include torn mitral valve  chordae or vegetation. Compared to the post-op TEE, MR appears worse. Clinical correlation advised. There is mild aortic regurgitation. A mobile echodensity noted on the left coronary cusp of the aortic valve  which was seen in the previous TEEs. Noted mild eccentric AI.   Jan 2020 CT scan 1. Soft tissue density in the anterior mediastinum adjacent to the median sternotomy which may simply be postoperative, but cannot exclude cellulitis. No drainable fluid collection. 2. Trace hydropneumopericardium as well as small locules of air in the anterior mediastinum inferiorly, most likely postoperative in nature. 3. Significant improved aeration when compared to prior study with residual  atelectasis/scarring in the right lower lobe. There is residual airspace consolidation left lower lobe consistent with resultant discoid atelectasis or pneumonia. 4. . Filling defect in the right jugular vein extending into the right brachiocephalic vein as well as a more broad-based filling defect in the left brachiocephalic vein, consistent with thrombus. 5. Interval sternotomy and mitral valve replacement with expected postoperative changes.   06/07/18 surgery note PREOPERATIVE DIAGNOSIS: ICD-10 I34.0 Mitral valve insufficiency  POSTOPERATIVE DIAGNOSIS: same  QUALITY MEASURES: 4047F Documentation of order for prophylactic antibiotics to be given within one hour (if fluoroquinolone or vancomycin, two hours) prior to surgical incision (or start of procedure when no incision is required) 4041F Documentation of order for cefazolin or cefuroxime for antimicrobial prophylaxis  HEMODYNAMICS AND CATH: Ejection fraction: 35%  VALVE DATA: Mitral Valve: Severe Insufficiency  PRIORITY: Emergent  INCIDENCE: First cardiovascular surgery   PROCEDURE: CPT Codes: 16109 * Mitral valve repair  with resection of ruptured anterior papillary muscle, reconstruction of papillary chord with two Neo-chords and placement of Simplici-T Annuloplasty ring via superior septal approach Model: 670 Serial: [id number]  CARDIOPULMONARY BYPASS TIME: 131 min  AORTIC CROSS CLAMP TIME: 92 min  DESCRIPTION OF OPERATION After induction of anesthesia, the patient was prepped and draped. Before the incision was made, time-out was observed by all members of the surgical and anesthesia teams to identify the correct patient and procedure. The heart was exposed via a full conventional sternotomy. The anatomy of the heart and great vessels was normal. On palpation the aorta was normal.   Intraoperative transesophageal echocardiography showed the left ventricular function to be hypokinetic with an ejection fraction of 35%.  Exam of the cardiac valves showed severe mitral regurgitation with anterior A2/A3 leaflet prolapse.  After heparinization, cardiopulmonary bypass was instituted. Arterial perfusion was via an arterial cannula placed in the aorta. For assisted venous return, drainage was from two cannuli placed through the right atrium into the superior and inferior vena cavi. Caval tapes were placed. The patient was cooled to a body core temperature of 36 degrees centigrade. The left ventricle was vented through the both aorta and right superior pulmonary vein.  The ascending aorta was cross-clamped and the heart was arrested with cold DelNido cardioplegia given antegrade. Once dose was adequate for the aforementioned cross clamp time. Topical cooling was used.  The mitral valve was exposed via a superior septal approach incision. The left atrium was carefully examined. There was no clot present. Retraction sutures were placed and the valve was well visualized. The valve was bicuspid and was diseased from an apparent degenerative etiology. The calcification of the leaflets was none and no calcification of the annulus. The valve was inspected and the defect found to be ruptured anterior papillary muscle causing the A2/A3 prolapse. The anterior leaflet was thickened. There were no vegetations or stigmata of endocarditis. The valve was amenable to repair.  The ruptured papillary muscle was resected. The chord was then reconstructed utilizing two 5-0 Neo-chords. The valve was statically tested and was found to be competent without leak.  Thirteen sutures of 2-0 dacron were placed in simple mattress fashion in the annulus from trigone to trigone posteriorly. A Medtronic Simplici T band was chosen and the sutures passed through the band. The band was seated and the sutures secured via CorKnots. The valve was again statically tested and was found to have a small leak at the medial commissure. A commisuroplasty was performed  utilizing 5-0 was performed. The valve then appeared to be hydrostatic.   The left ventricular vent was placed across the mitral valve. The left atrial and interatrial septal portions of the incisions were closed with running 4-0 Prolene. An aortic vent was placed to low suction and the cross clamp released. The right atrial portion of the incision was closed with a 4-0 Prolene suture. Caval tapes were released.  The patient was rewarmed. Cardioversion occurred spontaneously. The underlying rhythm was normal sinus. Atrial and ventricular pacing was used. The patient was weaned and separated from cardiopulmonary bypass. After discontinuation of cardiopulmonary bypass the heart adequately supported the circulation. Inotropes were used upon leaving the operating room. The left ventricular function was moderatly hypokinetic. The right ventricular function was mildly hypokinetic. Post procedure intraoperative transesophageal echocardiogram demonstrated an ejection fraction of 35% and there was well-seated annuloplasty ring with no mitral regurgitation.  The bypass lines were removed and the Heparin reversed with Protamine. The pericardium was drained with  one 24 Fr. Blake drain. The pleural space did not require a chest tube. The sternum was reapproximated with #7 and double stranded stainless steel wires. The linea alba was closed with #2 absorbable sutures. The pectoralis fascia was closed with #0 absorbable sutures. Subcutaneous tissues were closed with 2-0 absorbable suture. The skin was closed with 3-0 absorbable subcuticular suture. Sterile dressings were placed over the wounds.  There were no complications and sponge, instrument, and needle counts were reported as correct. The patient was transported to the Intensive Care Unit in stable condition.  Assessment and Plan:  1. Nonrheumatic mitral valve regurgitation   2. Chronic systolic HF (heart failure) (HCC)   3. Status post mitral valve repair     1. Nonrheumatic mitral valve regurgitation Mild to moderate mitral regurgitation on TEE February 2020.  Denies any dyspnea on exertion.  Will follow with surveillance echocardiogram  2. Chronic systolic HF (heart failure) (HCC) TEE July 28, 2018 EF 35 to 40%.  No recent weight gain, shortness of breath, or lower extremity edema noted.  We will follow with surveillance echocardiograms.  Continue Toprol-XL 25 mg daily, lisinopril 10 mg daily, Lasix 40 mg 1/2 tablet daily.  Take 40 mg on days you have shortness of breath or swelling.   He is asking for refills on all of his cardiac medications.  Medication Adjustments/Labs and Tests Ordered: Current medicines are reviewed at length with the patient today.  Concerns regarding medicines are outlined above.   Disposition: Follow-up with follow-up with Dr. Wyline Mood or APP 6 months.  Signed, Rennis Harding, NP 01/09/2020 4:22 PM    [facility] at [facility] [address] [place], [place], [place] [address] Phone: [phone]; Fax: [phone]

## 2020-01-09 ENCOUNTER — Other Ambulatory Visit: Payer: Self-pay

## 2020-01-09 ENCOUNTER — Ambulatory Visit (INDEPENDENT_AMBULATORY_CARE_PROVIDER_SITE_OTHER): Payer: Self-pay | Admitting: Family Medicine

## 2020-01-09 ENCOUNTER — Encounter: Payer: Self-pay | Admitting: Family Medicine

## 2020-01-09 VITALS — BP 124/78 | HR 61 | Ht 69.0 in | Wt 205.0 lb

## 2020-01-09 DIAGNOSIS — Z9889 Other specified postprocedural states: Secondary | ICD-10-CM

## 2020-01-09 DIAGNOSIS — I5022 Chronic systolic (congestive) heart failure: Secondary | ICD-10-CM

## 2020-01-09 DIAGNOSIS — I340 Nonrheumatic mitral (valve) insufficiency: Secondary | ICD-10-CM

## 2020-01-09 MED ORDER — ATORVASTATIN CALCIUM 10 MG PO TABS: 10.0000 mg | ORAL_TABLET | Freq: Every day | ORAL | 2 refills | Status: DC

## 2020-01-09 MED ORDER — LISINOPRIL 10 MG PO TABS: 10.0000 mg | ORAL_TABLET | Freq: Every day | ORAL | 2 refills | Status: DC

## 2020-01-09 MED ORDER — FUROSEMIDE 40 MG PO TABS: 20.0000 mg | ORAL_TABLET | Freq: Every day | ORAL | 2 refills | Status: DC

## 2020-01-09 MED ORDER — METOPROLOL SUCCINATE ER 25 MG PO TB24: 25.0000 mg | ORAL_TABLET | Freq: Every day | ORAL | 2 refills | Status: DC

## 2020-01-09 NOTE — Patient Instructions (Addendum)

## 2020-02-07 ENCOUNTER — Ambulatory Visit: Payer: Self-pay | Admitting: Student

## 2020-04-11 ENCOUNTER — Ambulatory Visit (INDEPENDENT_AMBULATORY_CARE_PROVIDER_SITE_OTHER): Payer: BC Managed Care – PPO | Admitting: Student

## 2020-04-11 ENCOUNTER — Encounter: Payer: Self-pay | Admitting: Student

## 2020-04-11 ENCOUNTER — Other Ambulatory Visit: Payer: Self-pay

## 2020-04-11 VITALS — BP 126/84 | HR 73 | Ht 69.0 in | Wt 215.6 lb

## 2020-04-11 DIAGNOSIS — I5023 Acute on chronic systolic (congestive) heart failure: Secondary | ICD-10-CM

## 2020-04-11 DIAGNOSIS — D8989 Other specified disorders involving the immune mechanism, not elsewhere classified: Secondary | ICD-10-CM | POA: Diagnosis not present

## 2020-04-11 DIAGNOSIS — Z9889 Other specified postprocedural states: Secondary | ICD-10-CM | POA: Diagnosis not present

## 2020-04-11 MED ORDER — METOPROLOL SUCCINATE ER 25 MG PO TB24: 25.0000 mg | ORAL_TABLET | Freq: Every day | ORAL | 2 refills | Status: DC

## 2020-04-11 MED ORDER — POTASSIUM CHLORIDE CRYS ER 20 MEQ PO TBCR: 20.0000 meq | EXTENDED_RELEASE_TABLET | Freq: Every day | ORAL | 3 refills | Status: DC

## 2020-04-11 MED ORDER — FUROSEMIDE 40 MG PO TABS: 40.0000 mg | ORAL_TABLET | Freq: Two times a day (BID) | ORAL | 1 refills | Status: DC

## 2020-04-11 MED ORDER — LISINOPRIL 10 MG PO TABS: 10.0000 mg | ORAL_TABLET | Freq: Every day | ORAL | 2 refills | Status: DC

## 2020-04-11 NOTE — Progress Notes (Signed)
Cardiology Office Note    Date:  04/11/2020   ID:  Donald [name], Donald [name] 09-Oct-1985, MRN [id number]  PCP:  Patient, No Pcp Per  Cardiologist: Dina Rich, MD [facility] - Dr. Emelda Fear   Chief Complaint  Patient presents with  . Follow-up    recent Emergency Dept visit    History of Present Illness:    Donald [name] is a 34 y.o. 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   He was last examined by Nena Polio, NP in 12/2019 and denied any recent chest pain or dyspnea on exertion. Weight was stable at 205 lbs. Was continued on his current cardiac medications including ASA, Atorvastatin 10mg  daily, Lasix 20mg  daily, Lisinopril 10mg  daily and Toprol-XL 25mg  daily. Was recommended to have surveillance echos but imaging was not ordered.   By review of Care Everywhere, he did present to [facility] ED in 02/2020 for evaluation of chest pain. He reported having sharp chest pain which was worse with inspiration and had been occurring for the past 2 days. HS Troponin values were flat at 54 and 57. Pro BNP was elevated to 5029 but by review of notes he was sent home and informed to follow-up with Cardiology as an outpatient.   In talking with the patient today, he says he was unaware that his fluid level was elevated at the time of ED evaluation. He reports a 4 to 6-week history of worsening dyspnea along with lower extremity edema. Says that weight has  increased by over 10 pounds. He was experiencing chest pressure the day of ED evaluation but denies any recurrent symptoms. No recent orthopnea or PND. He did double up on his Lasix and was taking 40 mg twice daily for several days but ran out approximately 5 days ago and has not been on a diuretic since.    Past Medical History:  Diagnosis Date  . Anemia   . Autoimmune disorder (HCC)    pyoderma gangrenosum  . Chronic systolic heart failure (HCC)   . DVT (deep venous thrombosis) (HCC)    h/o  . Mitral regurgitation   . Myocardial infarction (HCC)   . Seronegative spondylitis (HCC)    arthritis    Past Surgical History:  Procedure Laterality Date  . HERNIA REPAIR Right 2018  . MITRAL VALVE REPLACEMENT  06/07/2018   [facility] [place]    Current Medications: Outpatient Medications Prior to Visit  Medication Sig Dispense Refill  . ASPIRIN ADULT LOW STRENGTH 81 MG EC tablet Take 81 mg by mouth daily.     . ferrous sulfate 325 (65 FE) MG tablet Take 1 tablet (325 mg total) by mouth daily. 90 tablet 1  . furosemide (LASIX) 40 MG tablet Take 0.5 tablets (20 mg total) by mouth daily. Take 40 mg on days you have shortness of breath or swelling 135 tablet 2  . lisinopril (ZESTRIL) 10 MG tablet Take 1 tablet (10 mg total) by mouth daily. 90 tablet 2  . metoprolol succinate (TOPROL-XL) 25 MG 24 hr tablet Take 1 tablet (  25 mg total) by mouth daily. 90 tablet 2  . Adalimumab 40 MG/0.4ML PNKT Inject into the skin every 7 (seven) days.  (Patient not taking: Reported on 04/11/2020)    . atorvastatin (LIPITOR) 10 MG tablet Take 1 tablet (10 mg total) by mouth daily. (Patient not taking: Reported on 04/11/2020) 90 tablet 2   No facility-administered medications prior to visit.     Allergies:   Patient has no known allergies.   Social History   Socioeconomic History  . Marital status: Single    Spouse name: Not on file  . Number of children: Not on file  . Years of education: Not on file    . Highest education level: Not on file  Occupational History  . Not on file  Tobacco Use  . Smoking status: Former Games developer  . Smokeless tobacco: Never Used  Substance and Sexual Activity  . Alcohol use: No  . Drug use: No  . Sexual activity: Not on file  Other Topics Concern  . Not on file  Social History Narrative  . Not on file   Social Determinants of Health   Financial Resource Strain:   . Difficulty of Paying Living Expenses: Not on file  Food Insecurity:   . Worried About Programme researcher, broadcasting/film/video in the Last Year: Not on file  . Ran Out of Food in the Last Year: Not on file  Transportation Needs:   . Lack of Transportation (Medical): Not on file  . Lack of Transportation (Non-Medical): Not on file  Physical Activity:   . Days of Exercise per Week: Not on file  . Minutes of Exercise per Session: Not on file  Stress:   . Feeling of Stress : Not on file  Social Connections:   . Frequency of Communication with Friends and Family: Not on file  . Frequency of Social Gatherings with Friends and Family: Not on file  . Attends Religious Services: Not on file  . Active Member of Clubs or Organizations: Not on file  . Attends Banker Meetings: Not on file  . Marital Status: Not on file     Family History:  The patient's family history includes Depression in his father; Diabetes in his father and paternal grandmother; Multiple sclerosis in his mother; Psoriasis in his mother.   Review of Systems:   Please see the history of present illness.     General:  No chills, fever, night sweats or weight changes.  Cardiovascular:  No chest pain, orthopnea, palpitations, paroxysmal nocturnal dyspnea. Positive for dyspnea on exertion and edema.  Dermatological: No rash, lesions/masses Respiratory: No cough, dyspnea Urologic: No hematuria, dysuria Abdominal:   No nausea, vomiting, diarrhea, bright red blood per rectum, melena, or hematemesis Neurologic:  No visual changes,  wkns, changes in mental status. All other systems reviewed and are otherwise negative except as noted above.   Physical Exam:    VS:  BP 126/84   Pulse 73   Ht  (1.753 m)   Wt 215 lb 9.6 oz (97.8 kg)   SpO2 100%   BMI 31.84 kg/m    General: Well developed, well nourished,male appearing in no acute distress. Head: Normocephalic, atraumatic. Neck: No carotid bruits. JVD not elevated.  Lungs: Respirations regular and unlabored, without wheezes or rales.  Heart: Regular rate and rhythm. No S3 or S4.  3/6 SEM best appreciated along Apex.  Abdomen: Appears non-distended. No obvious abdominal masses. Msk:  Strength and tone appear normal for age.  No obvious joint deformities or effusions. Extremities: No clubbing or cyanosis. No edema.  Distal pedal pulses are 2+ bilaterally. Neuro: Alert and oriented X 3. Moves all extremities spontaneously. No focal deficits noted. Psych:  Responds to questions appropriately with a normal affect. Skin: No rashes or lesions noted  Wt Readings from Last 3 Encounters:  04/11/20 215 lb 9.6 oz (97.8 kg)  01/09/20 (!) 205 lb (93 kg)  03/08/19 205 lb (93 kg)     Studies/Labs Reviewed:   EKG:  EKG is not ordered today.    Recent Labs: No results found for requested labs within last 8760 hours.   Lipid Panel No results found for: CHOL, TRIG, HDL, CHOLHDL, VLDL, LDLCALC, LDLDIRECT  Additional studies/ records that were reviewed today include:   TTE: 07/2018 SUMMARY  The left ventricle is mildly dilated.   Left ventricular systolic function is moderately reduced.  LV ejection fraction = 35-40%.  The right ventricle is normal size.  The right ventricular systolic function is mildly reduced.  The left atrium is mildly dilated.  There is mild aortic regurgitation.  Status post mitral valve repair with annuloplasty ring  There is mild to moderate mitral regurgitation.  The mean gradient across the mitral valve is9.8 mmHg.  The heart rate for  the mean mitral valve gradient is 92 BPM.  There is mild tricuspid regurgitation.  There was insufficient TR detected to calculate RV systolic pressure.  Estimated right atrial pressure is 10 mmHg.Marland Kitchen  There is no pericardialeffusion.    Transesophageal Echocardiogram: 07/2018 SUMMARY  s/p Mitral valve repair with resection of ruptured anterior papillary muscle,  reconstruction of papillary chord with two Neo-chords and placement of  Simplici-T Annuloplasty ring.  Noted a mobile echodensity on the posterior leaflet of the mitral valve,  which may represents surgical suture. Differential include torn mitral valve  chordae or vegetation. Compared to the post-op TEE, MR appears worse.  Clinical correlation advised.  There is mild aortic regurgitation.  A mobile echodensity noted on the left coronary cusp of the aortic valve  which was seen in the previous TEEs. Noted mild eccentric AI.    Assessment:    1. Acute on chronic systolic (congestive) heart failure (HCC)   2. Status post mitral valve repair   3. Autoimmune disorder (HCC)      Plan:   In order of problems listed above:  1. Acute on Chronic Systolic CHF - He has a known reduced EF of 35 to 40% by prior echocardiogram in 07/2018. proBNP was elevated to greater than 5000 during recent ED evaluation but he did not receive additional diuretic therapy. He has been without his Lasix for several days but was previously responding well to 40 mg twice daily.  Will restart Lasix 40 mg twice daily with plans for a repeat BMET in 2 weeks as weight is > 10 lbs above his prior baseline. Will also obtain a repeat echocardiogram for reassessment of his EF and valve abnormalities. He was encouraged to present to the Emergency Department if symptoms acutely worsen in the interim.  - He does consume a high sodium diet and was encouraged to limit this to less than 2000 mg daily and fluid intake to less than 2 L daily. Will continue Lisinopril 10  mg daily and Toprol-XL 25 mg daily for his cardiomyopathy. If EF remains reduced, would consider transitioning Lisinopril to Entresto following washout period.  2. Mitral Regurgitation - He is s/p MV repair with resection of ruptured anterior  papillary muscle and reconstruction of papillary chord and placement of annuloplasty ring in 2019. TEE in 07/2018 showed a mobile echodensity on the posterior leaflet and possibly surgical suture/torn chordae/vegetation.  - Will plan for a repeat echocardiogram as outlined above.   3. Autoimmune Disorder - He has seronegative spondylitis and pyoderma gangrenosum by review of notes. Followed by Rheumatology.    Medication Adjustments/Labs and Tests Ordered: Current medicines are reviewed at length with the patient today.  Concerns regarding medicines are outlined above.  Medication changes, Labs and Tests ordered today are listed in the Patient Instructions below. Patient Instructions   Medication Instructions:  Your physician has recommended you make the following change in your medication:   Increase Lasix to 40 mg Two Times Daily  Start Potassium 20 meq Daily   *If you need a refill on your cardiac medications before your next appointment, please call your pharmacy*   Lab Work: Your physician recommends that you return for lab work in: 1-2 Weeks   If you have labs (blood work) drawn today and your tests are completely normal, you will receive your results only by: Marland Kitchen MyChart Message (if you have MyChart) OR . A paper copy in the mail If you have any lab test that is abnormal or we need to change your treatment, we will call you to review the results.   Testing/Procedures: Your physician has requested that you have an echocardiogram. Echocardiography is a painless test that uses sound waves to create images of your heart. It provides your doctor with information about the size and shape of your heart and how well your heart's chambers and valves  are working. This procedure takes approximately one hour. There are no restrictions for this procedure.     Follow-Up: At [facility], you and your health needs are our priority.  As part of our continuing mission to provide you with exceptional heart care, we have created designated Provider Care Teams.  These Care Teams include your primary Cardiologist (physician) and Advanced Practice Providers (APPs -  Physician Assistants and Nurse Practitioners) who all work together to provide you with the care you need, when you need it.  We recommend signing up for the patient portal called "MyChart".  Sign up information is provided on this After Visit Summary.  MyChart is used to connect with patients for Virtual Visits (Telemedicine).  Patients are able to view lab/test results, encounter notes, upcoming appointments, etc.  Non-urgent messages can be sent to your provider as well.   To learn more about what you can do with MyChart, go to [email or web address].    Your next appointment:   4 week(s)  The format for your next appointment:   In Person  Provider:   Randall An, PA-C   Other Instructions Thank you for choosing [facility]!    Two Gram Sodium Diet 2000 mg  What is Sodium? Sodium is a mineral found naturally in many foods. The most significant source of sodium in the diet is table salt, which is about 40% sodium.  Processed, convenience, and preserved foods also contain a large amount of sodium.  The body needs only 500 mg of sodium daily to function,  A normal diet provides more than enough sodium even if you do not use salt.  Why Limit Sodium? A build up of sodium in the body can cause thirst, increased blood pressure, shortness of breath, and water retention.  Decreasing sodium in the diet can reduce edema and risk of  heart attack or stroke associated with high blood pressure.  Keep in mind that there are many other factors involved in these health problems.   Heredity, obesity, lack of exercise, cigarette smoking, stress and what you eat all play a role.  General Guidelines:  Do not add salt at the table or in cooking.  One teaspoon of salt contains over 2 grams of sodium.  Read food labels  Avoid processed and convenience foods  Ask your dietitian before eating any foods not dicussed in the menu planning guidelines  Consult your physician if you wish to use a salt substitute or a sodium containing medication such as antacids.  Limit milk and milk products to 16 oz (2 cups) per day.  Shopping Hints:  READ LABELS!! "Dietetic" does not necessarily mean low sodium.  Salt and other sodium ingredients are often added to foods during processing.   Menu Planning Guidelines Food Group Choose More Often Avoid  Beverages (see also the milk group All fruit juices, low-sodium, salt-free vegetables juices, low-sodium carbonated beverages Regular vegetable or tomato juices, commercially softened water used for drinking or cooking  Breads and Cereals Enriched white, wheat, rye and pumpernickel bread, hard rolls and dinner rolls; muffins, cornbread and waffles; most dry cereals, cooked cereal without added salt; unsalted crackers and breadsticks; low sodium or homemade bread crumbs Bread, rolls and crackers with salted tops; quick breads; instant hot cereals; pancakes; commercial bread stuffing; self-rising flower and biscuit mixes; regular bread crumbs or cracker crumbs  Desserts and Sweets Desserts and sweets mad with mild should be within allowance Instant pudding mixes and cake mixes  Fats Butter or margarine; vegetable oils; unsalted salad dressings, regular salad dressings limited to 1 Tbs; light, sour and heavy cream Regular salad dressings containing bacon fat, bacon bits, and salt pork; snack dips made with instant soup mixes or processed cheese; salted nuts  Fruits Most fresh, frozen and canned fruits Fruits processed with salt or sodium-containing  ingredient (some dried fruits are processed with sodium sulfites        Vegetables Fresh, frozen vegetables and low- sodium canned vegetables Regular canned vegetables, sauerkraut, pickled vegetables, and others prepared in brine; frozen vegetables in sauces; vegetables seasoned with ham, bacon or salt pork  Condiments, Sauces, Miscellaneous  Salt substitute with physician's approval; pepper, herbs, spices; vinegar, lemon or lime juice; hot pepper sauce; garlic powder, onion powder, low sodium soy sauce (1 Tbs.); low sodium condiments (ketchup, chili sauce, mustard) in limited amounts (1 tsp.) fresh ground horseradish; unsalted tortilla chips, pretzels, potato chips, popcorn, salsa (1/4 cup) Any seasoning made with salt including garlic salt, celery salt, onion salt, and seasoned salt; sea salt, rock salt, kosher salt; meat tenderizers; monosodium glutamate; mustard, regular soy sauce, barbecue, sauce, chili sauce, teriyaki sauce, steak sauce, Worcestershire sauce, and most flavored vinegars; canned gravy and mixes; regular condiments; salted snack foods, olives, picles, relish, horseradish sauce, catsup   Food preparation: Try these seasonings Meats:    Pork Sage, onion Serve with applesauce  Chicken Poultry seasoning, thyme, parsley Serve with cranberry sauce  Lamb Curry powder, rosemary, garlic, thyme Serve with mint sauce or jelly  Veal Marjoram, basil Serve with current jelly, cranberry sauce  Beef Pepper, bay leaf Serve with dry mustard, unsalted chive butter  Fish Bay leaf, dill Serve with unsalted lemon butter, unsalted parsley butter  Vegetables:    Asparagus Lemon juice   Broccoli Lemon juice   Carrots Mustard dressing parsley, mint, nutmeg, glazed with unsalted butter and  sugar   Green beans Marjoram, lemon juice, nutmeg,dill seed   Tomatoes Basil, marjoram, onion   Spice /blend for [employer]" 4 tsp ground thyme 1 tsp ground sage 3 tsp ground rosemary 4 tsp ground marjoram    Test your knowledge 1. A product that says "Salt Free" may still contain sodium. True or False 2. Garlic Powder and Hot Pepper Sauce an be used as alternative seasonings.True or False 3. Processed foods have more sodium than fresh foods.  True or False 4. Canned Vegetables have less sodium than froze True or False  WAYS TO DECREASE YOUR SODIUM INTAKE 1. Avoid the use of added salt in cooking and at the table.  Table salt (and other prepared seasonings which contain salt) is probably one of the greatest sources of sodium in the diet.  Unsalted foods can gain flavor from the sweet, sour, and butter taste sensations of herbs and spices.  Instead of using salt for seasoning, try the following seasonings with the foods listed.  Remember: how you use them to enhance natural food flavors is limited only by your creativity... Allspice-Meat, fish, eggs, fruit, peas, red and yellow vegetables Almond Extract-Fruit baked goods Anise Seed-Sweet breads, fruit, carrots, beets, cottage cheese, cookies (tastes like licorice) Basil-Meat, fish, eggs, vegetables, rice, vegetables salads, soups, sauces Bay Leaf-Meat, fish, stews, poultry Burnet-Salad, vegetables (cucumber-like flavor) Caraway Seed-Bread, cookies, cottage cheese, meat, vegetables, cheese, rice Cardamon-Baked goods, fruit, soups Celery Powder or seed-Salads, salad dressings, sauces, meatloaf, soup, bread.Do not use  celery salt Chervil-Meats, salads, fish, eggs, vegetables, cottage cheese (parsley-like flavor) Chili Power-Meatloaf, chicken cheese, corn, eggplant, egg dishes Chives-Salads cottage cheese, egg dishes, soups, vegetables, sauces Cilantro-Salsa, casseroles Cinnamon-Baked goods, fruit, pork, lamb, chicken, carrots Cloves-Fruit, baked goods, fish, pot roast, green beans, beets, carrots Coriander-Pastry, cookies, meat, salads, cheese (lemon-orange flavor) Cumin-Meatloaf, fish,cheese, eggs, cabbage,fruit pie (caraway flavor) [employer], fruit, eggs, fish, poultry, cottage cheese, vegetables Dill Seed-Meat, cottage cheese, poultry, vegetables, fish, salads, bread Fennel Seed-Bread, cookies, apples, pork, eggs, fish, beets, cabbage, cheese, Licorice-like flavor Garlic-(buds or powder) Salads, meat, poultry, fish, bread, butter, vegetables, potatoes.Do not  use garlic salt Ginger-Fruit, vegetables, baked goods, meat, fish, poultry Horseradish Root-Meet, vegetables, butter Lemon Juice or Extract-Vegetables, fruit, tea, baked goods, fish salads Mace-Baked goods fruit, vegetables, fish, poultry (taste like nutmeg) Maple Extract-Syrups Marjoram-Meat, chicken, fish, vegetables, breads, green salads (taste like Sage) Mint-Tea, lamb, sherbet, vegetables, desserts, carrots, cabbage Mustard, Dry or Seed-Cheese, eggs, meats, vegetables, poultry Nutmeg-Baked goods, fruit, chicken, eggs, vegetables, desserts Onion Powder-Meat, fish, poultry, vegetables, cheese, eggs, bread, rice salads (Do not use   Onion salt) Orange Extract-Desserts, baked goods Oregano-Pasta, eggs, cheese, onions, pork, lamb, fish, chicken, vegetables, green salads Paprika-Meat, fish, poultry, eggs, cheese, vegetables Parsley Flakes-Butter, vegetables, meat fish, poultry, eggs, bread, salads (certain forms may   Contain sodium Pepper-Meat fish, poultry, vegetables, eggs Peppermint Extract-Desserts, baked goods Poppy Seed-Eggs, bread, cheese, fruit dressings, baked goods, noodles, vegetables, cottage  [employer], poultry, meat, fish, cauliflower, turnips,eggs bread Saffron-Rice, bread, veal, chicken, fish, eggs Sage-Meat, fish, poultry, onions, eggplant, tomateos, pork, stews Savory-Eggs, salads, poultry, meat, rice, vegetables, soups, pork Tarragon-Meat, poultry, fish, eggs, butter, vegetables (licorice-like flavor)  Thyme-Meat, poultry, fish, eggs, vegetables, (clover-like flavor), sauces,  soups Tumeric-Salads, butter, eggs, fish, rice, vegetables (saffron-like flavor) Vanilla Extract-Baked goods, candy Vinegar-Salads, vegetables, meat marinades Walnut Extract-baked goods, candy  2. Choose your Foods Wisely   The following is a list of foods to avoid which are high in sodium:  Meats-Avoid  all smoked, canned, salt cured, dried and kosher meat and fish as well as Anchovies   [employer] meats:Bologna, Liverwurst, Pastrami Canned meat or fish  Marinated herring Caviar    Pepperoni Corned Beef   Pizza Dried chipped beef  Salami Frozen breaded fish or meat Salt pork Frankfurters or hot dogs  Sardines Gefilte fish   Sausage Ham (boiled ham, Proscuitto Smoked butt    spiced ham)   Spam      TV Dinners Vegetables Canned vegetables (Regular) Relish Canned mushrooms  Sauerkraut Olives    Tomato juice Pickles  Bakery and Dessert Products Canned puddings  Cream pies Cheesecake   Decorated cakes Cookies  Beverages/Juices Tomato juice, regular  Gatorade   V-8 vegetable juice, regular  Breads and Cereals Biscuit mixes   Salted potato chips, corn chips, pretzels Bread stuffing mixes  Salted crackers and rolls Pancake and waffle mixes Self-rising flour  Seasonings Accent    Meat sauces Barbecue sauce  Meat tenderizer Catsup    Monosodium glutamate (MSG) Celery salt   Onion salt Chili sauce   Prepared mustard Garlic salt   Salt, seasoned salt, sea salt Gravy mixes   Soy sauce Horseradish   Steak sauce Ketchup   Tartar sauce Lite salt    Teriyaki sauce Marinade mixes   Worcestershire sauce  Others Baking powder   Cocoa and cocoa mixes Baking soda   Commercial casserole mixes Candy-caramels, chocolate  Dehydrated soups    Bars, fudge,nougats  Instant rice and pasta mixes Canned broth or soup  Maraschino cherries Cheese, aged and processed cheese and cheese spreads      Signed, Ellsworth Lennox, PA-C  04/11/2020 5:14 PM    [facility] [address] [place], [place] [address] Phone: [phone] Fax: [phone]

## 2020-04-11 NOTE — Patient Instructions (Addendum)
Medication Instructions:  Your physician has recommended you make the following change in your medication:   Increase Lasix to 40 mg Two Times Daily  Start Potassium 20 meq Daily   *If you need a refill on your cardiac medications before your next appointment, please call your pharmacy*   Lab Work: Your physician recommends that you return for lab work in: 1-2 Weeks   If you have labs (blood work) drawn today and your tests are completely normal, you will receive your results only by:  [employer] (if you have MyChart) OR  A paper copy in the mail If you have any lab test that is abnormal or we need to change your treatment, we will call you to review the results.   Testing/Procedures: Your physician has requested that you have an echocardiogram. Echocardiography is a painless test that uses sound waves to create images of your heart. It provides your doctor with information about the size and shape of your heart and how well your hearts chambers and valves are working. This procedure takes approximately one hour. There are no restrictions for this procedure.     Follow-Up: At [facility], you and your health needs are our priority.  As part of our continuing mission to provide you with exceptional heart care, we have created designated Provider Care Teams.  These Care Teams include your primary Cardiologist (physician) and Advanced Practice Providers (APPs -  Physician Assistants and Nurse Practitioners) who all work together to provide you with the care you need, when you need it.  We recommend signing up for the patient portal called "MyChart".  Sign up information is provided on this After Visit Summary.  MyChart is used to connect with patients for Virtual Visits (Telemedicine).  Patients are able to view lab/test results, encounter notes, upcoming appointments, etc.  Non-urgent messages can be sent to your provider as well.   To learn more about what you can do with  MyChart, go to [email or web address].    Your next appointment:   4 week(s)  The format for your next appointment:   In Person  Provider:   Randall An, PA-C   Other Instructions Thank you for choosing [facility]!    Two Gram Sodium Diet 2000 mg  What is Sodium? Sodium is a mineral found naturally in many foods. The most significant source of sodium in the diet is table salt, which is about 40% sodium.  Processed, convenience, and preserved foods also contain a large amount of sodium.  The body needs only 500 mg of sodium daily to function,  A normal diet provides more than enough sodium even if you do not use salt.  Why Limit Sodium? A build up of sodium in the body can cause thirst, increased blood pressure, shortness of breath, and water retention.  Decreasing sodium in the diet can reduce edema and risk of heart attack or stroke associated with high blood pressure.  Keep in mind that there are many other factors involved in these health problems.  Heredity, obesity, lack of exercise, cigarette smoking, stress and what you eat all play a role.  General Guidelines:  Do not add salt at the table or in cooking.  One teaspoon of salt contains over 2 grams of sodium.  Read food labels  Avoid processed and convenience foods  Ask your dietitian before eating any foods not dicussed in the menu planning guidelines  Consult your physician if you wish to use a salt substitute or a  sodium containing medication such as antacids.  Limit milk and milk products to 16 oz (2 cups) per day.  Shopping Hints:  READ LABELS!! "Dietetic" does not necessarily mean low sodium.  Salt and other sodium ingredients are often added to foods during processing.   Menu Planning Guidelines Food Group Choose More Often Avoid  Beverages (see also the milk group All fruit juices, low-sodium, salt-free vegetables juices, low-sodium carbonated beverages Regular vegetable or tomato juices,  commercially softened water used for drinking or cooking  Breads and Cereals Enriched white, wheat, rye and pumpernickel bread, hard rolls and dinner rolls; muffins, cornbread and waffles; most dry cereals, cooked cereal without added salt; unsalted crackers and breadsticks; low sodium or homemade bread crumbs Bread, rolls and crackers with salted tops; quick breads; instant hot cereals; pancakes; commercial bread stuffing; self-rising flower and biscuit mixes; regular bread crumbs or cracker crumbs  Desserts and Sweets Desserts and sweets mad with mild should be within allowance Instant pudding mixes and cake mixes  Fats Butter or margarine; vegetable oils; unsalted salad dressings, regular salad dressings limited to 1 Tbs; light, sour and heavy cream Regular salad dressings containing bacon fat, bacon bits, and salt pork; snack dips made with instant soup mixes or processed cheese; salted nuts  Fruits Most fresh, frozen and canned fruits Fruits processed with salt or sodium-containing ingredient (some dried fruits are processed with sodium sulfites        Vegetables Fresh, frozen vegetables and low- sodium canned vegetables Regular canned vegetables, sauerkraut, pickled vegetables, and others prepared in brine; frozen vegetables in sauces; vegetables seasoned with ham, bacon or salt pork  Condiments, Sauces, Miscellaneous  Salt substitute with physician's approval; pepper, herbs, spices; vinegar, lemon or lime juice; hot pepper sauce; garlic powder, onion powder, low sodium soy sauce (1 Tbs.); low sodium condiments (ketchup, chili sauce, mustard) in limited amounts (1 tsp.) fresh ground horseradish; unsalted tortilla chips, pretzels, potato chips, popcorn, salsa (1/4 cup) Any seasoning made with salt including garlic salt, celery salt, onion salt, and seasoned salt; sea salt, rock salt, kosher salt; meat tenderizers; monosodium glutamate; mustard, regular soy sauce, barbecue, sauce, chili sauce,  teriyaki sauce, steak sauce, Worcestershire sauce, and most flavored vinegars; canned gravy and mixes; regular condiments; salted snack foods, olives, picles, relish, horseradish sauce, catsup   Food preparation: Try these seasonings Meats:    Pork Sage, onion Serve with applesauce  Chicken Poultry seasoning, thyme, parsley Serve with cranberry sauce  Lamb Curry powder, rosemary, garlic, thyme Serve with mint sauce or jelly  Veal Marjoram, basil Serve with current jelly, cranberry sauce  Beef Pepper, bay leaf Serve with dry mustard, unsalted chive butter  Fish [place] leaf, dill Serve with unsalted lemon butter, unsalted parsley butter  Vegetables:    Asparagus Lemon juice   Broccoli Lemon juice   Carrots Mustard dressing parsley, mint, nutmeg, glazed with unsalted butter and sugar   Green beans Marjoram, lemon juice, nutmeg,dill seed   Tomatoes Basil, marjoram, onion   Spice /blend for [employer]" 4 tsp ground thyme 1 tsp ground sage 3 tsp ground rosemary 4 tsp ground marjoram   Test your knowledge 1. A product that says "Salt Free" may still contain sodium. True or False 2. Garlic Powder and Hot Pepper Sauce an be used as alternative seasonings.True or False 3. Processed foods have more sodium than fresh foods.  True or False 4. Canned Vegetables have less sodium than froze True or False  WAYS TO DECREASE YOUR SODIUM INTAKE 1.  Avoid the use of added salt in cooking and at the table.  Table salt (and other prepared seasonings which contain salt) is probably one of the greatest sources of sodium in the diet.  Unsalted foods can gain flavor from the sweet, sour, and butter taste sensations of herbs and spices.  Instead of using salt for seasoning, try the following seasonings with the foods listed.  Remember: how you use them to enhance natural food flavors is limited only by your creativity... Allspice-Meat, fish, eggs, fruit, peas, red and yellow vegetables Almond Extract-Fruit baked  goods Anise Seed-Sweet breads, fruit, carrots, beets, cottage cheese, cookies (tastes like licorice) Basil-Meat, fish, eggs, vegetables, rice, vegetables salads, soups, sauces Bay Leaf-Meat, fish, stews, poultry Burnet-Salad, vegetables (cucumber-like flavor) Caraway Seed-Bread, cookies, cottage cheese, meat, vegetables, cheese, rice Cardamon-Baked goods, fruit, soups Celery Powder or seed-Salads, salad dressings, sauces, meatloaf, soup, bread.Do not use  celery salt Chervil-Meats, salads, fish, eggs, vegetables, cottage cheese (parsley-like flavor) Chili Power-Meatloaf, chicken cheese, corn, eggplant, egg dishes Chives-Salads cottage cheese, egg dishes, soups, vegetables, sauces Cilantro-Salsa, casseroles Cinnamon-Baked goods, fruit, pork, lamb, chicken, carrots Cloves-Fruit, baked goods, fish, pot roast, green beans, beets, carrots Coriander-Pastry, cookies, meat, salads, cheese (lemon-orange flavor) Cumin-Meatloaf, fish,cheese, eggs, cabbage,fruit pie (caraway flavor) [employer], fruit, eggs, fish, poultry, cottage cheese, vegetables Dill Seed-Meat, cottage cheese, poultry, vegetables, fish, salads, bread Fennel Seed-Bread, cookies, apples, pork, eggs, fish, beets, cabbage, cheese, Licorice-like flavor Garlic-(buds or powder) Salads, meat, poultry, fish, bread, butter, vegetables, potatoes.Do not  use garlic salt Ginger-Fruit, vegetables, baked goods, meat, fish, poultry Horseradish Root-Meet, vegetables, butter Lemon Juice or Extract-Vegetables, fruit, tea, baked goods, fish salads Mace-Baked goods fruit, vegetables, fish, poultry (taste like nutmeg) Maple Extract-Syrups Marjoram-Meat, chicken, fish, vegetables, breads, green salads (taste like Sage) Mint-Tea, lamb, sherbet, vegetables, desserts, carrots, cabbage Mustard, Dry or Seed-Cheese, eggs, meats, vegetables, poultry Nutmeg-Baked goods, fruit, chicken, eggs, vegetables, desserts Onion Powder-Meat, fish, poultry,  vegetables, cheese, eggs, bread, rice salads (Do not use   Onion salt) Orange Extract-Desserts, baked goods Oregano-Pasta, eggs, cheese, onions, pork, lamb, fish, chicken, vegetables, green salads Paprika-Meat, fish, poultry, eggs, cheese, vegetables Parsley Flakes-Butter, vegetables, meat fish, poultry, eggs, bread, salads (certain forms may   Contain sodium Pepper-Meat fish, poultry, vegetables, eggs Peppermint Extract-Desserts, baked goods Poppy Seed-Eggs, bread, cheese, fruit dressings, baked goods, noodles, vegetables, cottage  [employer], poultry, meat, fish, cauliflower, turnips,eggs bread Saffron-Rice, bread, veal, chicken, fish, eggs Sage-Meat, fish, poultry, onions, eggplant, tomateos, pork, stews Savory-Eggs, salads, poultry, meat, rice, vegetables, soups, pork Tarragon-Meat, poultry, fish, eggs, butter, vegetables (licorice-like flavor)  Thyme-Meat, poultry, fish, eggs, vegetables, (clover-like flavor), sauces, soups Tumeric-Salads, butter, eggs, fish, rice, vegetables (saffron-like flavor) Vanilla Extract-Baked goods, candy Vinegar-Salads, vegetables, meat marinades Walnut Extract-baked goods, candy  2. Choose your Foods Wisely   The following is a list of foods to avoid which are high in sodium:  Meats-Avoid all smoked, canned, salt cured, dried and kosher meat and fish as well as Anchovies   [employer] meats:Bologna, Liverwurst, Pastrami Canned meat or fish  Marinated herring Caviar    Pepperoni Corned Beef   Pizza Dried chipped beef  Salami Frozen breaded fish or meat Salt pork Frankfurters or hot dogs  Sardines Gefilte fish   Sausage Ham (boiled ham, Proscuitto Smoked butt    spiced ham)   Spam      TV Dinners Vegetables Canned vegetables (Regular) Relish Canned mushrooms  Sauerkraut Olives    Tomato juice [employer] and  Dessert Products Canned puddings  Cream pies Cheesecake   Decorated  cakes Cookies  Beverages/Juices Tomato juice, regular  Gatorade   V-8 vegetable juice, regular  Breads and Cereals Biscuit mixes   Salted potato chips, corn chips, pretzels Bread stuffing mixes  Salted crackers and rolls Pancake and waffle mixes Self-rising flour  Seasonings Accent    Meat sauces Barbecue sauce  Meat tenderizer Catsup    Monosodium glutamate (MSG) Celery salt   Onion salt Chili sauce   Prepared mustard Garlic salt   Salt, seasoned salt, sea salt Gravy mixes   Soy sauce Horseradish   Steak sauce Ketchup   Tartar sauce Lite salt    Teriyaki sauce Marinade mixes   Worcestershire sauce  Others Baking powder   Cocoa and cocoa mixes Baking soda   Commercial casserole mixes Candy-caramels, chocolate  Dehydrated soups    Bars, fudge,nougats  Instant rice and pasta mixes Canned broth or soup  Maraschino cherries Cheese, aged and processed cheese and cheese spreads

## 2020-04-18 ENCOUNTER — Telehealth: Payer: Self-pay | Admitting: Cardiology

## 2020-04-18 NOTE — Telephone Encounter (Signed)
°   Mother called and stated Donald [name] called this morning and said he was swollen all over and throwing up. He saw B. Strader on 04/11/20, the meds she prescribed are not working. It would be best to give his mother a call back [phone] (Mother: Richrd Kuzniar)

## 2020-04-19 ENCOUNTER — Telehealth: Payer: Self-pay

## 2020-04-19 ENCOUNTER — Ambulatory Visit (HOSPITAL_COMMUNITY)
Admission: RE | Admit: 2020-04-19 | Discharge: 2020-04-19 | Disposition: A | Discharge status: Home / Self Care | Payer: BC Managed Care – PPO | Source: Ambulatory Visit | Attending: Student | Admitting: Student

## 2020-04-19 ENCOUNTER — Other Ambulatory Visit: Payer: Self-pay

## 2020-04-19 DIAGNOSIS — Z9889 Other specified postprocedural states: Secondary | ICD-10-CM | POA: Insufficient documentation

## 2020-04-19 DIAGNOSIS — I5023 Acute on chronic systolic (congestive) heart failure: Secondary | ICD-10-CM | POA: Insufficient documentation

## 2020-04-19 NOTE — Telephone Encounter (Signed)
    The patient just had his echocardiogram this afternoon and it shows the pumping function of his heart has actually improved over the past year as his EF was previously 35-40% in 07/2018 and is now at 50% which is close to normal of 55-65%. 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 Verna Czech reply to see if he wishes for the patient to undergo a transesophageal echocardiogram like he had in the past to further assess his mitral valve.    In regards to his current symptoms, please confirm he is taking Lasix 40mg  TWICE daily. If swelling has improved, would continue current dosing for now and ask him to follow daily weights. If this increases by 2 lbs overnight or 5 lbs in 1 week, he can take an extra Lasix tablet. I would not further titrate his dosing at this time until he has repeat labs so we can reassess his kidney function and electrolytes.   Signed, , PA-C 04/19/2020, 6:52 PM

## 2020-04-19 NOTE — Telephone Encounter (Signed)
Spoke with pt who states that he has been SOB but not increased from last visit. Pt states that his swelling has decreased some but not completely. Pt does not have a way to weigh daily but will come by or send his father to pick up scale in office. Please advise.

## 2020-04-19 NOTE — Progress Notes (Signed)
*  PRELIMINARY RESULTS* Echocardiogram 2D Echocardiogram has been performed.  Stacey Drain 04/19/2020, 3:59 PM

## 2020-04-20 NOTE — Telephone Encounter (Signed)
Pt notified and voiced understanding 

## 2020-04-20 NOTE — Telephone Encounter (Signed)
Called pt no answer. Left msg to call back.  

## 2020-05-08 ENCOUNTER — Encounter: Payer: Self-pay | Admitting: Student

## 2020-05-08 ENCOUNTER — Other Ambulatory Visit: Payer: Self-pay

## 2020-05-08 ENCOUNTER — Telehealth (INDEPENDENT_AMBULATORY_CARE_PROVIDER_SITE_OTHER): Payer: BC Managed Care – PPO | Admitting: Student

## 2020-05-08 ENCOUNTER — Telehealth: Payer: Self-pay | Admitting: Student

## 2020-05-08 VITALS — Ht 69.0 in

## 2020-05-08 DIAGNOSIS — I5022 Chronic systolic (congestive) heart failure: Secondary | ICD-10-CM | POA: Diagnosis not present

## 2020-05-08 DIAGNOSIS — I340 Nonrheumatic mitral (valve) insufficiency: Secondary | ICD-10-CM | POA: Diagnosis not present

## 2020-05-08 DIAGNOSIS — D8989 Other specified disorders involving the immune mechanism, not elsewhere classified: Secondary | ICD-10-CM | POA: Diagnosis not present

## 2020-05-08 NOTE — Telephone Encounter (Signed)
  Patient Consent for Virtual Visit         Donald [name] has provided verbal consent on 05/08/2020 for a virtual visit (video or telephone).   CONSENT FOR VIRTUAL VISIT FOR:  Donald [name]  By participating in this virtual visit I agree to the following:  I hereby voluntarily request, consent and authorize [facility] and its employed or contracted physicians, physician assistants, nurse practitioners or other licensed health care professionals (the Practitioner), to provide me with telemedicine health care services (the "Services") as deemed necessary by the treating Practitioner. I acknowledge and consent to receive the Services by the Practitioner via telemedicine. I understand that the telemedicine visit will involve communicating with the Practitioner through live audiovisual communication technology and the disclosure of certain medical information by electronic transmission. I acknowledge that I have been given the opportunity to request an in-person assessment or other available alternative prior to the telemedicine visit and am voluntarily participating in the telemedicine visit.  I understand that I have the right to withhold or withdraw my consent to the use of telemedicine in the course of my care at any time, without affecting my right to future care or treatment, and that the Practitioner or I may terminate the telemedicine visit at any time. I understand that I have the right to inspect all information obtained and/or recorded in the course of the telemedicine visit and may receive copies of available information for a reasonable fee.  I understand that some of the potential risks of receiving the Services via telemedicine include:  Marland Kitchen Delay or interruption in medical evaluation due to technological equipment failure or disruption; . Information transmitted may not be sufficient (e.g. poor resolution of images) to allow for appropriate medical decision making by the  Practitioner; and/or  . In rare instances, security protocols could fail, causing a breach of personal health information.  Furthermore, I acknowledge that it is my responsibility to provide information about my medical history, conditions and care that is complete and accurate to the best of my ability. I acknowledge that Practitioner's advice, recommendations, and/or decision may be based on factors not within their control, such as incomplete or inaccurate data provided by me or distortions of diagnostic images or specimens that may result from electronic transmissions. I understand that the practice of medicine is not an exact science and that Practitioner makes no warranties or guarantees regarding treatment outcomes. I acknowledge that a copy of this consent can be made available to me via my patient portal [facility] MyChart), or I can request a printed copy by calling the office of [facility].    I understand that my insurance will be billed for this visit.   I have read or had this consent read to me. . I understand the contents of this consent, which adequately explains the benefits and risks of the Services being provided via telemedicine.  . I have been provided ample opportunity to ask questions regarding this consent and the Services and have had my questions answered to my satisfaction. . I give my informed consent for the services to be provided through the use of telemedicine in my medical care

## 2020-05-08 NOTE — Patient Instructions (Signed)
Medication Instructions:  Your physician recommends that you continue on your current medications as directed. Please refer to the Current Medication list given to you today.  *If you need a refill on your cardiac medications before your next appointment, please call your pharmacy*   Lab Work: NONE   If you have labs (blood work) drawn today and your tests are completely normal, you will receive your results only by: [employer]land [employer] MyChart Message (if you have MyChart) OR . A paper copy in the mail If you have any lab test that is abnormal or we need to change your treatment, we will call you to review the results.   Testing/Procedures: Your physician has requested that you have a TEE. During a TEE, sound waves are used to create images of your heart. It provides your doctor with information about the size and shape of your heart and how well your heart's chambers and valves are working. In this test, a transducer is attached to the end of a flexible tube that's guided down your throat and into your esophagus (the tube leading from you mouth to your stomach) to get a more detailed image of your heart. You are not awake for the procedure. Please see the instruction sheet given to you today. For further information please visit [email or web address]   Follow-Up: At [facility], you and your health needs are our priority.  As part of our continuing mission to provide you with exceptional heart care, we have created designated Provider Care Teams.  These Care Teams include your primary Cardiologist (physician) and Advanced Practice Providers (APPs -  Physician Assistants and Nurse Practitioners) who all work together to provide you with the care you need, when you need it.  We recommend signing up for the patient portal called "MyChart".  Sign up information is provided on this After Visit Summary.  MyChart is used to connect with patients for Virtual Visits (Telemedicine).  Patients are able to view lab/test  results, encounter notes, upcoming appointments, etc.  Non-urgent messages can be sent to your provider as well.   To learn more about what you can do with MyChart, go to [email or web address].    Your next appointment:    January 2022   The format for your next appointment:   In Person  Provider:   Dina Rich, MD   Other Instructions Thank you for choosing [facility]!

## 2020-05-08 NOTE — Progress Notes (Signed)
Virtual Visit via Telephone Note   This visit type was conducted due to national recommendations for restrictions regarding the COVID-19 Pandemic (e.g. social distancing) in an effort to limit this patient's exposure and mitigate transmission in our community.  Due to his co-morbid illnesses, this patient is at least at moderate risk for complications without adequate follow up.  This format is felt to be most appropriate for this patient at this time.  The patient did not have access to video technology/had technical difficulties with video requiring transitioning to audio format only (telephone).  All issues noted in this document were discussed and addressed.  No physical exam could be performed with this format.  Please refer to the patient's chart for his  consent to telehealth for [facility].    Date:  05/08/2020   ID:  Donald [name], DOB 07/11/1985, MRN [id number] The patient was identified using 2 identifiers.  Patient Location: Home Provider Location: Office/Clinic  PCP:  Patient, No Pcp Per  Cardiologist:  Dina Rich, MD  Electrophysiologist:  None   Evaluation Performed:  Follow-Up Visit  Chief Complaint: Follow-up from Echocardiogram  History of Present Illness:    Donald [name] is a 34 y.o. 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   He was last examined by myself in 03/2020 following a recent ED visit at [facility] for evaluation of chest pain. BNP  had been elevated to 5029 but he said he was not informed of those results. He did report worsening dyspnea and lower extremity at the time of his visit, therefore he was restarted on Lasix 40 mg twice daily with plans for repeat labs in 2 weeks. Was also recommended he have a repeat echocardiogram for reassessment of his EF and mitral valve. His echocardiogram showed his EF had improved to 50% and RV function was normal. He did have possible severe MR with an elevated gradient across the MV repair and there was a linear density that crossed the annulus which was difficult to distinguish. Findings were reviewed with Dr. Wyline Mood and he recommended proceeding with a transesophageal echocardiogram for further evaluation which could be performed at [facility] with anesthesia assistance.  In talking with the patient today, he reports overall doing well since his last visit. His breathing and lower extremity edema have overall improved. He has not yet picked up scales from the office so is unsure of his current weight. Says he did develop brief worsening dyspnea one day last week and went to [facility] for evaluation. By review of available records, his ProBNP was elevated to 10734 and HS Troponin values were flat at 82 and 77. Says he was given IV Lasix and sent home. He denies any recurrent orthopnea or PND. No recent chest pain or palpitations. Now compliant with Lasix 40mg  BID.   The patient does not have symptoms concerning for COVID-19 infection (fever, chills, cough, or new shortness of breath).    Past Medical History:  Diagnosis Date   Anemia    Autoimmune disorder (HCC)    pyoderma gangrenosum  Chronic systolic heart failure (HCC)    DVT (deep venous thrombosis) (HCC)    h/o   Mitral regurgitation    Myocardial infarction (HCC)    Seronegative spondylitis (HCC)    arthritis   Past Surgical History:  Procedure Laterality Date   HERNIA REPAIR Right 2018   MITRAL VALVE  REPLACEMENT  06/07/2018   [facility]     Current Meds  Medication Sig   ASPIRIN ADULT LOW STRENGTH 81 MG EC tablet Take 81 mg by mouth daily.    ferrous sulfate 325 (65 FE) MG tablet Take 1 tablet (325 mg total) by mouth daily.   furosemide (LASIX) 40 MG tablet Take 1 tablet (40 mg total) by mouth 2 (two) times daily.   lisinopril (ZESTRIL) 10 MG tablet Take 1 tablet (10 mg total) by mouth daily.   metoprolol succinate (TOPROL-XL) 25 MG 24 hr tablet Take 1 tablet (25 mg total) by mouth daily.   potassium chloride SA (KLOR-CON) 20 MEQ tablet Take 1 tablet (20 mEq total) by mouth daily.     Allergies:   Patient has no known allergies.   Social History   Tobacco Use   Smoking status: Former Smoker   Smokeless tobacco: Never Used  Building services engineer Use: Never used  Substance Use Topics   Alcohol use: No   Drug use: No     Family Hx: The patient's family history includes Depression in his father; Diabetes in his father and paternal grandmother; Multiple sclerosis in his mother; Psoriasis in his mother.  ROS:   Please see the history of present illness.     All other systems reviewed and are negative.   Prior CV studies:   The following studies were reviewed today:   Echocardiogram: 04/2020 IMPRESSIONS    1. Left ventricular ejection fraction, by estimation, is 50%. The left  ventricle has mildly decreased function. The left ventricle has no  regional wall motion abnormalities. There is mild left ventricular  hypertrophy. Left ventricular diastolic  parameters are indeterminate.  2. Right ventricular systolic function is normal. The right ventricular  size is mildly enlarged.  3. Left atrial size was severely dilated.  4. Right atrial size was severely dilated.  5. Prior mitral valve repair. The MR jet is turbulent and poorly  visualized making it difficult to quantify. The MV to AV VTI is 3  suggesting possible severe MR. Elevated gradient  across the MV repair of  10 mmHg. There is a linear density that crosses  the anulus that is difficult to distinguish, perhaps portion of the  repair. Recommend TEE to better evaluate anatomy and function of mitral  valve. . The mitral valve has been repaired/replaced. difficult to  quantify. At least moderate. mitral valve  regurgitation. The mean mitral valve gradient is 10.0 mmHg.  6. Tricuspid valve regurgitation is mild to moderate.  7. The aortic valve is tricuspid. Aortic valve regurgitation is mild. No  aortic stenosis is present.  8. The inferior vena cava is dilated in size with >50% respiratory  variability, suggesting right atrial pressure of 8 mmHg.    Labs/Other Tests and Data Reviewed:    EKG:  No ECG reviewed.  Recent Labs: No results found for requested labs within last 8760 hours.   Recent Lipid Panel No results found for: CHOL, TRIG, HDL, CHOLHDL, LDLCALC, LDLDIRECT  Wt Readings from Last 3 Encounters:  04/11/20 215 lb 9.6 oz (97.8 kg)  01/09/20 (!) 205 lb (93 kg)  03/08/19 205 lb (93 kg)     Objective:    Vital Signs:  Ht 5\' 9"  (1.753 m)    BMI 31.84 kg/m    General: Pleasant male sounding in NAD Psych: Normal affect. Neuro: Alert and oriented X 3. Lungs:  Resp regular and unlabored while talking on the phone.   ASSESSMENT & PLAN:     1. Mitral Regurgitation - He is s/p MV repair with resection of ruptured anterior papillary muscle and reconstruction of papillary chord and placement of annuloplasty ring in 2019 with TEE in 07/2018 showing mobile echodensity on the posterior leaflet and possibly surgical suture/torn chordae/vegetation. Most recent repeat TTE showed possible severe MR with an elevated gradient across the MV repair and there was a linear density that crossed the annulus which was difficult to distinguish. Reviewed with Dr. 08/2018 and a TEE was recommended for further evaluation and could be performed at [facility] with anesthesia assistance.    - Reviewed findings and recommendations with the patient today and he is in agreement for a repeat TEE. Pending results, may need referral back to CT Surgery at [facility] as this is where his surgery was performed.   2. Chronic Systolic CHF - His EF was at 35-40% by echo in 07/2018, improved to 50% by most recent imaging. He was evaluated in the ED recently for what sounds concerning for flash pulmonary edema. Breathing has since returned to baseline. Compliance with BID dosing of Lasix was encouraged as he was previously only taking this once daily. Was also encouraged to pick up the scales provided by Social Work and to follow daily weights.  - Continue Lasix 40mg  BID, Lisinopril 10mg  daily and Toprol-XL 25mg  daily.   3. Autoimmune disorder - He is followed by Rheumatology for seronegative spondylitis and pyoderma gangrenosum.    Shared Decision Making/Informed Consent    The risks [esophageal damage, perforation (1:10,000 risk), bleeding, pharyngeal hematoma as well as other potential complications associated with conscious sedation including aspiration, arrhythmia, respiratory failure and death], benefits (treatment guidance and diagnostic support) and alternatives of a transesophageal echocardiogram were discussed in detail with Donald [name] and he is willing to proceed.     COVID-19 Education: The signs and symptoms of COVID-19 were discussed with the patient and how to seek care for testing (follow up with PCP or arrange E-visit).  The importance of social distancing was discussed today.  Time:   Today, I have spent 16 minutes with the patient with telehealth technology discussing the above problems.     Medication Adjustments/Labs and Tests Ordered: Current medicines are reviewed at length with the patient today.  Concerns regarding medicines are outlined above.   Tests Ordered: No orders of the defined types were placed in this encounter.   Medication Changes: No orders of  the defined types were placed in this encounter.   Follow Up: Keep scheduled follow-up in 06/2020.  Signed, , PA-C  05/08/2020 6:43 PM    [facility]

## 2020-05-09 ENCOUNTER — Telehealth: Payer: Self-pay | Admitting: *Deleted

## 2020-05-09 ENCOUNTER — Encounter: Payer: Self-pay | Admitting: *Deleted

## 2020-05-09 NOTE — Telephone Encounter (Signed)
Pt notified of date and time of pre op and TEE. Pt voiced understanding.

## 2020-05-17 ENCOUNTER — Telehealth: Payer: Self-pay | Admitting: Cardiology

## 2020-05-17 NOTE — Patient Instructions (Signed)
Donald [name]  05/17/2020     @PREFPERIOPPHARMACY @   Your procedure is scheduled on   05/23/2020  Report to [facility] at  0800  A.M.  Call this number if you have problems the morning of surgery:  [phone]   Remember:  Do not eat or drink after midnight.                         Take these medicines the morning of surgery with A SIP OF WATER  Metoprolol.    Do not wear jewelry, make-up or nail polish.  Do not wear lotions, powders, or perfumes. Please wear deodorant and brush your teeth.  Do not shave 48 hours prior to surgery.  Men may shave face and neck.  Do not bring valuables to the hospital.  [facility] is not responsible for any belongings or valuables.  Contacts, dentures or bridgework may not be worn into surgery.  Leave your suitcase in the car.  After surgery it may be brought to your room.  For patients admitted to the hospital, discharge time will be determined by your treatment team.  Patients discharged the day of surgery will not be allowed to drive home.   Name and phone number of your driver:   family Special instructions:  DO NOT smoke the morning of your procedure.  Please read over the following fact sheets that you were given. Anesthesia Post-op Instructions and Care and Recovery After Surgery      Monitored Anesthesia Care, Care After These instructions provide you with information about caring for yourself after your procedure. Your health care provider may also give you more specific instructions. Your treatment has been planned according to current medical practices, but problems sometimes occur. Call your health care provider if you have any problems or questions after your procedure. What can I expect after the procedure? After your procedure, you may:  Feel sleepy for several hours.  Feel clumsy and have poor balance for several hours.  Feel forgetful about what happened after the procedure.  Have poor judgment for  several hours.  Feel nauseous or vomit.  Have a sore throat if you had a breathing tube during the procedure. Follow these instructions at home: For at least 24 hours after the procedure:      Have a responsible adult stay with you. It is important to have someone help care for you until you are awake and alert.  Rest as needed.  Do not: ? Participate in activities in which you could fall or become injured. ? Drive. ? Use heavy machinery. ? Drink alcohol. ? Take sleeping pills or medicines that cause drowsiness. ? Make important decisions or sign legal documents. ? Take care of children on your own. Eating and drinking  Follow the diet that is recommended by your health care provider.  If you vomit, drink water, juice, or soup when you can drink without vomiting.  Make sure you have little or no nausea before eating solid foods. General instructions  Take over-the-counter and prescription medicines only as told by your health care provider.  If you have sleep apnea, surgery and certain medicines can increase your risk for breathing problems. Follow instructions from your health care provider about wearing your sleep device: ? Anytime you are sleeping, including during daytime naps. ? While taking prescription pain medicines, sleeping medicines, or medicines that make you drowsy.  If you smoke, do not  smoke without supervision.  Keep all follow-up visits as told by your health care provider. This is important. Contact a health care provider if:  You keep feeling nauseous or you keep vomiting.  You feel light-headed.  You develop a rash.  You have a fever. Get help right away if:  You have trouble breathing. Summary  For several hours after your procedure, you may feel sleepy and have poor judgment.  Have a responsible adult stay with you for at least 24 hours or until you are awake and alert. This information is not intended to replace advice given to you by  your health care provider. Make sure you discuss any questions you have with your health care provider. Document Revised: 08/31/2017 Document Reviewed: 09/23/2015 Elsevier Patient Education  2020 [employer].

## 2020-05-17 NOTE — Telephone Encounter (Signed)
Left message for patient to return call.

## 2020-05-17 NOTE — Telephone Encounter (Signed)
Ok to provide note, can he see me in clinic Monday at 820. Cut and paste the following. Also please get more info on what dose of diuretic he has been taking.     To whom it may concern, Mr Donald [name] is followed in our cardiology. He has reported to [place] recent symptoms of significant shortness of breath and edema that is limiting his activity. Please excuse him from court obligations today, we have arranged an appointment as early as possible on Monday morning for a complete evaluation and management of his symptoms.   Dina Rich MD

## 2020-05-17 NOTE — Telephone Encounter (Signed)
I will defer to Dr.Branch °

## 2020-05-17 NOTE — Telephone Encounter (Signed)
New message     Patient is to be in court today and he wants you to fax a note to the court saying he can not attend he has not been able to get out of bed for 3 days   Fax [phone]  Pt c/o swelling: STAT is pt has developed SOB within 24 hours  1) How much weight have you gained and in what time span? 7 lbs  2) If swelling, where is the swelling located?  Legs and face   3) Are you currently taking a fluid pill? yes  4) Are you currently SOB? Sounds like he is wheezing  5) Do you have a log of your daily weights (if so, list)?  no  6) Have you gained 3 pounds in a day or 5 pounds in a week? yes  7) Have you traveled recently? no

## 2020-05-17 NOTE — Telephone Encounter (Signed)
If weight gain and SOB increase to 60mg  bid until our f/u on Monday   J Rayona Sardinha MD

## 2020-05-17 NOTE — Telephone Encounter (Signed)
Patient is taking lasix 40 mg BID

## 2020-05-18 NOTE — Telephone Encounter (Signed)
Patient had called back stating he was at [facility] having labs drawn and to call him back.I called his cell phone and got answering machine.I asked that he return call.

## 2020-05-21 ENCOUNTER — Encounter: Payer: Self-pay | Admitting: Cardiology

## 2020-05-21 ENCOUNTER — Other Ambulatory Visit: Payer: Self-pay

## 2020-05-21 ENCOUNTER — Ambulatory Visit (INDEPENDENT_AMBULATORY_CARE_PROVIDER_SITE_OTHER): Payer: BC Managed Care – PPO | Admitting: Cardiology

## 2020-05-21 VITALS — BP 108/68 | HR 70 | Ht 69.0 in | Wt 214.0 lb

## 2020-05-21 DIAGNOSIS — I340 Nonrheumatic mitral (valve) insufficiency: Secondary | ICD-10-CM | POA: Diagnosis not present

## 2020-05-21 DIAGNOSIS — I5022 Chronic systolic (congestive) heart failure: Secondary | ICD-10-CM | POA: Diagnosis not present

## 2020-05-21 MED ORDER — FUROSEMIDE 40 MG PO TABS: 60.0000 mg | ORAL_TABLET | Freq: Two times a day (BID) | ORAL | 3 refills | Status: DC

## 2020-05-21 NOTE — Progress Notes (Signed)
Clinical Summary Mr. Corbit is a 34 y.o.male seen today for follow up of the following medical problems.  1. Mitral regurgitation - admit 05/2018 with acute respiratory failure. From notes had cardaic arrest with induction for intubation.  - TTE done showed severe MR - TEE showed severe prolapse and possible flail A3 scallop with severe MR.   - 06/07/2018 went to OR for MV repair with resection of ruptured anterior papillary muscle,reconstruction of papillary chord with two Neo-chords and placement of Simplici-T Annuloplasty ring via superior septal approach - Intraop/Postop TEE reports no MR initially after surgery   07/2018 TEE during repeat admission mobile echodensity posterior leaflet, possible surgical suture, vs torn chordae vs vegetation. The MR is not reported regardign severity, ERO 0.93 would suggest severe, unclear  - CT surgery at that time recommended continued medical therapy, reconsider repeat intervention at ouptatient followup - thought perhaps an autoimmune disease may have causes some myxomatous degeneration. Undergoing rheum evaluation    - has repeat TEE this week - looks like he missed his f/u with [facility] CT surgery 02/2019 - at time of last appt 11/2018 had mod MR, asymptomatic.  - 04/2020 echo LVEF 50%, severe BAE, MV/AV VTI is 3 suggesting possible severe MR.        2.Chronic systolic HF - readmitted 07/2018 with volume overload - multiple TTE's and TEEs as reported below. Last LVEF 35-40% by TTE in 07/2018,LVIDd 5.6 and LVIDs 4.3, mild LAE.  - discharged on Lasix 40mg  daily, lisinopril 5mg , TOprol 25mg , ASA 81, atorva 10,    - recent issues with fluid overload over the last few months. Partly due to some missed lasix doses. Had an ER visit with pulm edema, did not require admission 04/2020 echo LVEF 50%, severe BAE, MV/AV VTI is 3 suggesting possible severe MR.    ER visit 04/2020 with volume overload, managed in ER with diuresis.  -  admit 05/18/20 with volume overload, diuresed and dischaged. ProBNP was 7000. K 3.3 Cr 1. CXR with pulm edema - presented with abdominal distension, SOB.Had been compliant lasix 40mg  bid.      3. Automimmunedisease - [facility] had signs of bilateral SI joint scerlosis, possibly fistulous tract to rectum Concern for possible seronegative spondyloarhtritis and or IBD.  4. Left coronary cusp density - chronic byechoimaging.    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     Past Medical History:  Diagnosis Date  . Anemia   . Autoimmune disorder (HCC)    pyoderma gangrenosum  . Chronic systolic heart failure (HCC)   . DVT (deep venous thrombosis) (HCC)    h/o  . Mitral regurgitation   . Myocardial infarction (HCC)   . Seronegative spondylitis (HCC)    arthritis     No Known Allergies   Current Outpatient Medications  Medication Sig Dispense Refill  . ASPIRIN ADULT LOW STRENGTH 81 MG EC tablet Take 81 mg by mouth daily.     . ferrous sulfate 325 (65 FE) MG tablet Take 1 tablet (325 mg total) by mouth daily. 90 tablet 1  . furosemide (LASIX) 40 MG tablet Take 1 tablet (40 mg total) by mouth 2 (two) times daily. 180  tablet 1  . lisinopril (ZESTRIL) 10 MG tablet Take 1 tablet (10 mg total) by mouth daily. 90 tablet 2  . metoprolol succinate (TOPROL-XL) 25 MG 24 hr tablet Take 1 tablet (25 mg total) by mouth daily. 90 tablet 2  . potassium chloride SA (KLOR-CON) 20 MEQ tablet Take 1 tablet (20 mEq total) by mouth daily. 90 tablet 3   No current facility-administered medications for this visit.     Past Surgical History:  Procedure Laterality Date  . HERNIA REPAIR Right 2018  . MITRAL VALVE REPLACEMENT  06/07/2018   [facility]     No Known Allergies    Family History  Problem Relation Age of Onset  . Multiple sclerosis Mother   . Psoriasis Mother   . Depression Father   . Diabetes Father   . Diabetes Paternal Grandmother      Social History Mr. Cloninger reports that he has quit smoking. He has never used smokeless tobacco. Mr. Ironside reports no history of alcohol use.   Review of Systems CONSTITUTIONAL: No weight loss, fever, chills, weakness or fatigue.  HEENT: Eyes: No visual loss, blurred vision, double vision or yellow sclerae.No hearing loss, sneezing, congestion, runny nose or sore throat.  SKIN: No rash or itching.  CARDIOVASCULAR: per hpi RESPIRATORY: per hpi GASTROINTESTINAL: No anorexia, nausea, vomiting or diarrhea. No abdominal pain or blood.  GENITOURINARY: No burning on urination, no polyuria NEUROLOGICAL: No headache, dizziness, syncope, paralysis, ataxia, numbness or tingling in the extremities. No change in bowel or bladder control.  MUSCULOSKELETAL: No muscle, back pain, joint pain or stiffness.  LYMPHATICS: No enlarged nodes. No history of splenectomy.  PSYCHIATRIC: No history of depression or anxiety.  ENDOCRINOLOGIC: No reports of sweating, cold or heat intolerance. No polyuria or polydipsia.  Marland Kitchen   Physical Examination Today's Vitals   05/21/20 0835  BP: 108/68  Pulse: 70  SpO2: 100%  Weight: 214 lb (97.1 kg)  Height: 5\' 9"  (1.753 m)   Body mass index is 31.6 kg/m.  Gen: resting comfortably, no acute distress HEENT: no scleral icterus, pupils equal round and reactive, no palptable cervical adenopathy,  CV: RRR, 3/6 systolic murmur apex, mildly elevated JVD Resp: Clear to auscultation bilaterally GI: abdomen is soft, non-tender, non-distended, normal bowel sounds, no hepatosplenomegaly MSK: extremities are warm, 1+ bilateral LE edema Skin: warm, no rash Neuro:  no focal deficits Psych: appropriate affect   Diagnostic Studies 06/06/18 Echo  [facility] SUMMARY Mild left ventricular hypertrophy The left ventricle is mildly dilated. The apex is contracting normally. The rest of LV wall is hypokinetic. LV ejection fraction = 35-40%.  Left ventricular systolic function is moderately reduced. The right ventricle is normal in size and function. There is severe mitral regurgitation. There is mild mitral valve thickening. Suspect anterior mitral valve prolapse. Recommend TEE for further evaluation. Normal IVC size with decreased respiratory collapse. There is no pericardial effusion. There is no comparison study available.   06/06/2018 TEE [facility] SUMMARY There is severe prolapse (possible flail) of A3 scallop resulting in severe,  posteriorly directed regurgitation with a 0.5 cm gap in closure. Pulmonary  vein flow shows systolic reversal. Papillary muscles appear intact. No  vegetations seen. The left ventricular size is normal.  Left ventricular systolic function is mildly reduced. The right ventricle is normal in size and function. Diffuse thickening of the aortic valve with preserved cusp opening. There is  an extremely small ( <2 mm) echodensity on the ventricular side of the aortic  valve--no significant aortic regurgitation; recommend clinical correlation. There is mild tricuspid regurgitation. No overt vegetations identified. 3D Echocardiography was performed and reviewed for assessment of cardiac  structure and function using a workstation. Findings were integrated into the  echo report.  06/07/18 Intraop TEE Pre-Intervention Summary An Omniplane TEE probe was inserted orally. A full TEE exam was performed  including 2D, M-Mode, Color and Doppler. The TEE probe was placed  atraumatically on the first attempt. The left ventricle is severely dilated. There is normal left ventricular wall  thickness. Left ventricular systolic function is moderately reduced. The EF  is 30-35%. There is mild to moderate global hypokinesis  of the left  ventricle. There is moderate to severe inferior wall hypokinesis. The right ventricle is moderate to severely dilated. The right ventricular  systolic function is mildly reduced. The interatrial septum is intact with no evidence for an atrial septal  defect. The left atrium is severely dilated. Right atrial size is normal. There is a ruptured chordae with flail of the A3 segment of the anterior  mitral leaflet. No significant mitral valve stenosis. There is severe mitral  regurgitation. Flow reversal noted in pulmonary veins consistent with  significant mitral regurgitation. The mitral regurgitant jet is posteriorly  directed, which is consistent with anterior leaflet pathology. The tricuspid valve is normal in structure and function. There is trace  tricuspid regurgitation. The aortic valve is trileaflet. The aortic valve opens well. There is a small  irregularity on the left coronary cusp adjacent to the right coronary cusp.  Cannot exclude aortic valvular vegetation. No hemodynamically significant  valvular aortic stenosis. Trace to mild AI with central jet. The pulmonic valve is normal in structure and function. > The aortic root is normal size. No obvious dissection could be visualized.  There is no pericardial effusion. There is no pleural effusion.  Post-Intervention Summary The EF remains 30-35%. There is moderate to severe inferior wall hypokinesis. The right ventricular systolic function is normal. The interatrial septum is intact with no evidence for an atrial septal defect. There is no mitral regurgitation noted. An annuloplasty ring is noted in the  mitral position. There is trace tricuspid regurgitation. Trace to mild AI. The pulmonic valve is not well visualized. No obvious dissection could be visualized. There is no pericardial effusion.  There is no pleural effusion. MMode/2D Measurements &Calculations EDV(MOD-sp4): 72.3 ml ESV(MOD-sp4): 117.0  ml EDV(MOD-sp2): 257.0 ml ESV(MOD-sp2): 150.0 ml SV(MOD-sp4): -44.7 ml Pre-Intervention Doppler Measurements and Calculations MV max PG: 11.1 mmHg MV mean PG: 4.8 mmHg Ao V2 max: 87.0 cm/sec Ao max PG: 3.0 mmHg Ao V2 mean: 51.9 cm/sec Ao mean PG: 1.4 mmHg Ao V2 VTI: 13.4 cm   Overall TEE Interpretation Summary Pre-intervention exam: The LV is severey dilated with mild to moderate global  hypokinesis. The EF is 30-35%. There is moderate to severe hypokinesis of the  inferior wall. The RV systolic function is mildly reduced. There is a  ruptured chordae and flail of the A3 segment of the anterior mitral leaflet.  The posterior leaflet appears normal. Severe MR with posteriorly directed jet  and systolic flow reversal in the pulmonary veins. There is a small echodense  irregularity on the left coronary cusp of the aortic valve adjacent to the  right coronary cusp. Vegetation cannot be ruled out. There is trace to mild  AI with a central jet. There is trace TR. There is no obvious aortic  dissection. There is no pericardial effusion. There is no pleural effusion.  Post-intervention exam: The LV EF remains unchanged from the pre-intervention  exam. The RV systolic function has improved and is now normal. There is an  annuloplasty ring in the mitral position. There is no MR. Otherwise the exam  is unchanged from the pre-intervention exam. At the conclusion of the  procedure the probe was removed. There were no apparant complications related  to the TEE examination.   Jun 18 2018 echo SUMMARY There is normal left ventricular wall thickness. The left ventricle is mildly dilated.  Left ventricular systolic function is mildly reduced. LV ejection fraction = 40-45%. Abnormal (paradoxical) septal motion consistent with post-operative status. There is hypokinesis of the basal to mid inferior wall. The right ventricle is normal in size and function. The left atrial size is normal. An  annuloplasty ring is noted in the mitral position. The mean gradient across the mitral valve is 7.6 mmHg. There is no mitral regurgitation noted. The aortic sinus is normal in size. IVC size was normal. There is no pericardial effusion. Compared to the last surface echo dated 06/06/18, the LV systolic function is  slightly improved and the patient is s/p mitral valve repair.  Jul 28 2018 TTE SUMMARY The left ventricle is mildly dilated.  Left ventricular systolic function is moderately reduced. LV ejection fraction = 35-40%. The right ventricle is normal size. The right ventricular systolic function is mildly reduced. The left atrium is mildly dilated. There is mild aortic regurgitation. Status post mitral valve repair with annuloplasty ring There is mild to moderate mitral regurgitation. The mean gradient across the mitral valve is 9.8 mmHg. The heart rate for the mean mitral valve gradient is 92 BPM. There is mild tricuspid regurgitation. There was insufficient TR detected to calculate RV systolic pressure. Estimated right atrial pressure is 10 mmHg.Marland Kitchen There is no pericardial effusion.   Jul 28 2018 TEE - SUMMARY s/p Mitral valve repair with resection of ruptured anterior papillary muscle,  reconstruction of papillary chord with two Neo-chords and placement of  Simplici-T Annuloplasty ring. Noted a mobile echodensity on the posterior leaflet of the mitral valve,  which may represents surgical suture. Differential include torn mitral valve  chordae or vegetation. Compared to the post-op TEE, MR appears worse. Clinical correlation advised. There is mild aortic regurgitation. A mobile echodensity noted on the left coronary cusp of the aortic valve  which was seen in the previous TEEs. Noted mild eccentric AI.   Jan 2020 CT scan 1. Soft tissue density in the anterior mediastinum adjacent to the median sternotomy which may simply be postoperative, but cannot exclude  cellulitis. No drainable fluid collection. 2. Trace hydropneumopericardium as well as small locules of air in the anterior mediastinum inferiorly, most likely postoperative in nature. 3. Significant improved aeration when compared to prior study with residual atelectasis/scarring in the right lower lobe. There is residual airspace consolidation left lower lobe consistent with resultant discoid atelectasis or pneumonia. 4. . Filling defect in the right jugular vein extending into the right brachiocephalic vein as well as a more broad-based filling defect in the left brachiocephalic vein, consistent with thrombus. 5. Interval sternotomy and mitral valve replacement with expected postoperative changes.   06/07/18 surgery note PREOPERATIVE DIAGNOSIS: ICD-10 I34.0 Mitral valve insufficiency  POSTOPERATIVE DIAGNOSIS: same  QUALITY MEASURES: 4047F Documentation of order for prophylactic antibiotics to be given within one hour (if fluoroquinolone or vancomycin, two hours) prior to surgical incision (or start of procedure when no incision is required) 4041F Documentation of order for cefazolin  or cefuroxime for antimicrobial prophylaxis  HEMODYNAMICS AND CATH: Ejection fraction: 35%  VALVE DATA: Mitral Valve: Severe Insufficiency  PRIORITY: Emergent  INCIDENCE: First cardiovascular surgery   PROCEDURE: CPT Codes: 16109 * Mitral valve repair with resection of ruptured anterior papillary muscle, reconstruction of papillary chord with two Neo-chords and placement of Simplici-T Annuloplasty ring via superior septal approach Model: 670 Serial: [id number]  CARDIOPULMONARY BYPASS TIME: 131 min  AORTIC CROSS CLAMP TIME: 92 min  DESCRIPTION OF OPERATION After induction of anesthesia, the patient was prepped and draped. Before the incision was made, time-out was observed by all members of the surgical and anesthesia teams to identify the correct patient and procedure. The heart was exposed via a  full conventional sternotomy. The anatomy of the heart and great vessels was normal. On palpation the aorta was normal.   Intraoperative transesophageal echocardiography showed the left ventricular function to be hypokinetic with an ejection fraction of 35%. Exam of the cardiac valves showed severe mitral regurgitation with anterior A2/A3 leaflet prolapse.  After heparinization, cardiopulmonary bypass was instituted. Arterial perfusion was via an arterial cannula placed in the aorta. For assisted venous return, drainage was from two cannuli placed through the right atrium into the superior and inferior vena cavi. Caval tapes were placed. The patient was cooled to a body core temperature of 36 degrees centigrade. The left ventricle was vented through the both aorta and right superior pulmonary vein.  The ascending aorta was cross-clamped and the heart was arrested with cold DelNido cardioplegia given antegrade. Once dose was adequate for the aforementioned cross clamp time. Topical cooling was used.  The mitral valve was exposed via a superior septal approach incision. The left atrium was carefully examined. There was no clot present. Retraction sutures were placed and the valve was well visualized. The valve was bicuspid and was diseased from an apparent degenerative etiology. The calcification of the leaflets was none and no calcification of the annulus. The valve was inspected and the defect found to be ruptured anterior papillary muscle causing the A2/A3 prolapse. The anterior leaflet was thickened. There were no vegetations or stigmata of endocarditis. The valve was amenable to repair.  The ruptured papillary muscle was resected. The chord was then reconstructed utilizing two 5-0 Neo-chords. The valve was statically tested and was found to be competent without leak.  Thirteen sutures of 2-0 dacron were placed in simple mattress fashion in the annulus from trigone to trigone posteriorly. A Medtronic  Simplici T band was chosen and the sutures passed through the band. The band was seated and the sutures secured via CorKnots. The valve was again statically tested and was found to have a small leak at the medial commissure. A commisuroplasty was performed utilizing 5-0 was performed. The valve then appeared to be hydrostatic.   The left ventricular vent was placed across the mitral valve. The left atrial and interatrial septal portions of the incisions were closed with running 4-0 Prolene. An aortic vent was placed to low suction and the cross clamp released. The right atrial portion of the incision was closed with a 4-0 Prolene suture. Caval tapes were released.  The patient was rewarmed. Cardioversion occurred spontaneously. The underlying rhythm was normal sinus. Atrial and ventricular pacing was used. The patient was weaned and separated from cardiopulmonary bypass. After discontinuation of cardiopulmonary bypass the heart adequately supported the circulation. Inotropes were used upon leaving the operating room. The left ventricular function was moderatly hypokinetic. The right ventricular function was mildly hypokinetic. Post  procedure intraoperative transesophageal echocardiogram demonstrated an ejection fraction of 35% and there was well-seated annuloplasty ring with no mitral regurgitation.  The bypass lines were removed and the Heparin reversed with Protamine. The pericardium was drained with one 24 Fr. Blake drain. The pleural space did not require a chest tube. The sternum was reapproximated with #7 and double stranded stainless steel wires. The linea alba was closed with #2 absorbable sutures. The pectoralis fascia was closed with #0 absorbable sutures. Subcutaneous tissues were closed with 2-0 absorbable suture. The skin was closed with 3-0 absorbable subcuticular suture. Sterile dressings were placed over the wounds.  There were no complications and sponge, instrument, and needle counts were  reported as correct. The patient was transported to the Intensive Care Unit in stable condition.    Assessment and Plan   1. Mitral regurgitation - complex history as described above with recent repair, appears to have recurrent MR. - by most recent TTE appears to be severe MR, has TEE for later this week - 2 recent ER/hospital visits for volume overload, pending TEE results will need close f/u with CT surgery to reconsider valve intervention.  - we will increase his lasix to 60mg  bid.     2. Chronic systolic HF - LVEF 35-40% by prior echo, most recent LVEF 50% but this is in the presence of what looks to be severe MR and thus an overestimation of true function - - 2 recent ER/hospital visits for volume overload - mildly volume overloaded today, increase lasix to 60mg  bid   Has TEE later this week, pending results will likely need very close f/u with CT surgery. Like at the point for a repeat valve intervention.     , M.D.

## 2020-05-21 NOTE — Patient Instructions (Signed)
Medication Instructions:  INCREASE Lasix to 60 mg twice a day. *If you need a refill on your cardiac medications before your next appointment, please call your pharmacy*   Lab Work: None today If you have labs (blood work) drawn today and your tests are completely normal, you will receive your results only by: Marland Kitchen MyChart Message (if you have MyChart) OR . A paper copy in the mail If you have any lab test that is abnormal or we need to change your treatment, we will call you to review the results.   Testing/Procedures: None today   Follow-Up: At [facility], you and your health needs are our priority.  As part of our continuing mission to provide you with exceptional heart care, we have created designated Provider Care Teams.  These Care Teams include your primary Cardiologist (physician) and Advanced Practice Providers (APPs -  Physician Assistants and Nurse Practitioners) who all work together to provide you with the care you need, when you need it.  We recommend signing up for the patient portal called "MyChart".  Sign up information is provided on this After Visit Summary.  MyChart is used to connect with patients for Virtual Visits (Telemedicine).  Patients are able to view lab/test results, encounter notes, upcoming appointments, etc.  Non-urgent messages can be sent to your provider as well.   To learn more about what you can do with MyChart, go to [email or web address].    Your next appointment:   1 month(s)  The format for your next appointment:   In Person  Provider:   You may see Dina Rich, MD or one of the following Advanced Practice Providers on your designated Care Team:    Randall An, PA-C   Jacolyn Reedy, [place]     Other Instructions None

## 2020-05-21 NOTE — Telephone Encounter (Signed)
Patient seen in office today 05/21/20

## 2020-05-22 ENCOUNTER — Encounter (HOSPITAL_COMMUNITY): Payer: Self-pay

## 2020-05-22 ENCOUNTER — Other Ambulatory Visit: Payer: Self-pay | Admitting: Cardiology

## 2020-05-22 ENCOUNTER — Other Ambulatory Visit (HOSPITAL_COMMUNITY)
Admission: RE | Admit: 2020-05-22 | Discharge: 2020-05-22 | Disposition: A | Discharge status: Home / Self Care | Payer: BC Managed Care – PPO | Source: Ambulatory Visit | Attending: Cardiology | Admitting: Cardiology

## 2020-05-22 ENCOUNTER — Encounter (HOSPITAL_COMMUNITY)
Admission: RE | Admit: 2020-05-22 | Discharge: 2020-05-22 | Disposition: A | Discharge status: Home / Self Care | Payer: BC Managed Care – PPO | Source: Ambulatory Visit | Attending: Cardiology | Admitting: Cardiology

## 2020-05-22 DIAGNOSIS — I5022 Chronic systolic (congestive) heart failure: Secondary | ICD-10-CM | POA: Diagnosis not present

## 2020-05-22 DIAGNOSIS — I340 Nonrheumatic mitral (valve) insufficiency: Secondary | ICD-10-CM

## 2020-05-22 DIAGNOSIS — I081 Rheumatic disorders of both mitral and tricuspid valves: Secondary | ICD-10-CM | POA: Diagnosis present

## 2020-05-22 DIAGNOSIS — Z01812 Encounter for preprocedural laboratory examination: Secondary | ICD-10-CM | POA: Insufficient documentation

## 2020-05-22 DIAGNOSIS — Z79899 Other long term (current) drug therapy: Secondary | ICD-10-CM | POA: Diagnosis not present

## 2020-05-22 DIAGNOSIS — Z87891 Personal history of nicotine dependence: Secondary | ICD-10-CM | POA: Diagnosis not present

## 2020-05-22 DIAGNOSIS — Z20822 Contact with and (suspected) exposure to covid-19: Secondary | ICD-10-CM | POA: Diagnosis not present

## 2020-05-22 LAB — SARS CORONAVIRUS 2 (TAT 6-24 HRS): SARS Coronavirus 2: NEGATIVE

## 2020-05-22 MED ORDER — SODIUM CHLORIDE 0.9% FLUSH: 3.0000 mL | Freq: Two times a day (BID) | INTRAVENOUS | Status: DC

## 2020-05-23 ENCOUNTER — Ambulatory Visit (HOSPITAL_COMMUNITY)
Admission: RE | Admit: 2020-05-23 | Discharge: 2020-05-23 | Disposition: A | Discharge status: Home / Self Care | Payer: BC Managed Care – PPO | Attending: Cardiology | Admitting: Cardiology

## 2020-05-23 ENCOUNTER — Encounter (HOSPITAL_COMMUNITY): Payer: Self-pay | Admitting: Cardiology

## 2020-05-23 ENCOUNTER — Ambulatory Visit (HOSPITAL_COMMUNITY): Payer: BC Managed Care – PPO | Admitting: Anesthesiology

## 2020-05-23 ENCOUNTER — Encounter (HOSPITAL_COMMUNITY)
Admission: RE | Disposition: A | Discharge status: Home / Self Care | Payer: Self-pay | Source: Home / Self Care | Attending: Cardiology

## 2020-05-23 ENCOUNTER — Ambulatory Visit (HOSPITAL_BASED_OUTPATIENT_CLINIC_OR_DEPARTMENT_OTHER): Payer: BC Managed Care – PPO

## 2020-05-23 ENCOUNTER — Other Ambulatory Visit: Payer: Self-pay

## 2020-05-23 DIAGNOSIS — I081 Rheumatic disorders of both mitral and tricuspid valves: Secondary | ICD-10-CM | POA: Insufficient documentation

## 2020-05-23 DIAGNOSIS — I340 Nonrheumatic mitral (valve) insufficiency: Secondary | ICD-10-CM

## 2020-05-23 DIAGNOSIS — I5022 Chronic systolic (congestive) heart failure: Secondary | ICD-10-CM | POA: Insufficient documentation

## 2020-05-23 DIAGNOSIS — I342 Nonrheumatic mitral (valve) stenosis: Secondary | ICD-10-CM

## 2020-05-23 DIAGNOSIS — Z20822 Contact with and (suspected) exposure to covid-19: Secondary | ICD-10-CM | POA: Insufficient documentation

## 2020-05-23 DIAGNOSIS — I361 Nonrheumatic tricuspid (valve) insufficiency: Secondary | ICD-10-CM

## 2020-05-23 DIAGNOSIS — Z87891 Personal history of nicotine dependence: Secondary | ICD-10-CM | POA: Insufficient documentation

## 2020-05-23 DIAGNOSIS — Z79899 Other long term (current) drug therapy: Secondary | ICD-10-CM | POA: Insufficient documentation

## 2020-05-23 HISTORY — PX: TEE WITHOUT CARDIOVERSION: SHX5443

## 2020-05-23 SURGERY — ECHOCARDIOGRAM, TRANSESOPHAGEAL
Anesthesia: General

## 2020-05-23 MED ORDER — LIDOCAINE HCL (CARDIAC) PF 100 MG/5ML IV SOSY
PREFILLED_SYRINGE | INTRAVENOUS | Status: DC | PRN
  Administered 2020-05-23: 50 mg via INTRATRACHEAL

## 2020-05-23 MED ORDER — PHENYLEPHRINE 40 MCG/ML (10ML) SYRINGE FOR IV PUSH (FOR BLOOD PRESSURE SUPPORT)
PREFILLED_SYRINGE | INTRAVENOUS | Status: DC | PRN
  Administered 2020-05-23 (×2): 40 ug via INTRAVENOUS
  Administered 2020-05-23: 80 ug via INTRAVENOUS

## 2020-05-23 MED ORDER — LIDOCAINE VISCOUS HCL 2 % MT SOLN
OROMUCOSAL | Status: AC
  Filled 2020-05-23: qty 15

## 2020-05-23 MED ORDER — LIDOCAINE VISCOUS HCL 2 % MT SOLN: 15.0000 mL | Freq: Once | OROMUCOSAL | Status: DC

## 2020-05-23 MED ORDER — KETAMINE HCL 10 MG/ML IJ SOLN
INTRAMUSCULAR | Status: DC | PRN
  Administered 2020-05-23 (×2): 5 mg via INTRAVENOUS
  Administered 2020-05-23: 10 mg via INTRAVENOUS

## 2020-05-23 MED ORDER — PROPOFOL 10 MG/ML IV BOLUS
INTRAVENOUS | Status: DC | PRN
  Administered 2020-05-23: 40 mg via INTRAVENOUS

## 2020-05-23 MED ORDER — PROPOFOL 10 MG/ML IV BOLUS
INTRAVENOUS | Status: AC
  Filled 2020-05-23: qty 60

## 2020-05-23 MED ORDER — EPHEDRINE SULFATE-NACL 50-0.9 MG/10ML-% IV SOSY
PREFILLED_SYRINGE | INTRAVENOUS | Status: DC | PRN
  Administered 2020-05-23: 10 mg via INTRAVENOUS
  Administered 2020-05-23 (×2): 5 mg via INTRAVENOUS

## 2020-05-23 MED ORDER — PROPOFOL 500 MG/50ML IV EMUL
INTRAVENOUS | Status: DC | PRN
  Administered 2020-05-23: 150 ug/kg/min via INTRAVENOUS

## 2020-05-23 MED ORDER — ETOMIDATE 2 MG/ML IV SOLN
INTRAVENOUS | Status: DC | PRN
  Administered 2020-05-23: 4 mg via INTRAVENOUS
  Administered 2020-05-23: 2 mg via INTRAVENOUS

## 2020-05-23 MED ORDER — KETAMINE HCL 50 MG/5ML IJ SOSY
PREFILLED_SYRINGE | INTRAMUSCULAR | Status: AC
  Filled 2020-05-23: qty 5

## 2020-05-23 MED ORDER — SODIUM CHLORIDE 0.9% FLUSH: 3.0000 mL | INTRAVENOUS | Status: DC | PRN

## 2020-05-23 MED ORDER — SODIUM CHLORIDE 0.9 % IV SOLN: 250.0000 mL | INTRAVENOUS | Status: DC | PRN

## 2020-05-23 MED ORDER — LACTATED RINGERS IV SOLN: INTRAVENOUS | Status: DC | PRN

## 2020-05-23 MED ORDER — LACTATED RINGERS IV SOLN: Freq: Once | INTRAVENOUS | Status: AC

## 2020-05-23 NOTE — Transfer of Care (Signed)
Immediate Anesthesia Transfer of Care Note  Patient: Donald [name]  Procedure(s) Performed: TRANSESOPHAGEAL ECHOCARDIOGRAM (TEE) WITH PROPOFOL (N/A )  Patient Location: PACU  Anesthesia Type:General  Level of Consciousness: awake  Airway & Oxygen Therapy: Patient Spontanous Breathing and Patient connected to face mask oxygen  Post-op Assessment: Report given to RN, Post -op Vital signs reviewed and stable and Patient moving all extremities  Post vital signs: Reviewed and stable  Last Vitals:  Vitals Value Taken Time  BP 109/79 05/23/20 1108  Temp    Pulse 82 05/23/20 1109  Resp 15 05/23/20 1109  SpO2 100 % 05/23/20 1109  Vitals shown include unvalidated device data.  Last Pain:  Vitals:   05/23/20 0820  TempSrc: Oral  PainSc: 9          Complications: No complications documented.

## 2020-05-23 NOTE — CV Procedure (Signed)
Procedure: Transesophageal echocardiogram Indication: Mitral regurgitation Physician: Dr Dina Rich MD   Patient was brought to the procedure suite after appropriate consent was obtained. Sedation was achieved with the assistance of anesthesiology, please see there note for details. Patient was placed in the left lateral decubitus position and bite block placed, TEE probe intubated into the esophagus without difficulty and several images obtained. Cardiopulmonary monitoring was performed throughout the procedure, he tolerated well without complications. Please see final TEE report, from prelim moderate to severe MR   Dina Rich MD

## 2020-05-23 NOTE — H&P (Signed)
Patient presents for outpatient TEE for mitral regurgitation. Please refer to recent clinic note referenced below for full medical history. Plan for TEE today with assistance of anesthesiology.    Dina Rich MD    Clinical Summary Donald [name] is a 34 y.o.male seen today for follow up of the following medical problems.  1. Mitral regurgitation - admit 05/2018 with acute respiratory failure. From notes had cardaic arrest with induction for intubation.  - TTE done showed severe MR - TEE showed severe prolapse and possible flail A3 scallop with severe MR.   - 06/07/2018 went to OR for MV repair with resection of ruptured anterior papillary muscle,reconstruction of papillary chord with two Neo-chords and placement of Simplici-T Annuloplasty ring via superior septal approach - Intraop/Postop TEE reports no MR initially after surgery   07/2018 TEE during repeat admission mobile echodensity posterior leaflet, possible surgical suture, vs torn chordae vs vegetation. The MR is not reported regardign severity, ERO 0.93 would suggest severe, unclear  - CT surgery at that time recommended continued medical therapy, reconsider repeat intervention at ouptatient followup - thought perhaps an autoimmune disease may have causes some myxomatous degeneration. Undergoing rheum evaluation    - has repeat TEE this week - looks like he missed his f/u with [facility] CT surgery 02/2019 - at time of last appt 11/2018 had mod MR, asymptomatic.  - 04/2020 echo LVEF 50%, severe BAE, MV/AV VTI is 3 suggesting possible severe MR.        2.Chronic systolic HF - readmitted 07/2018 with volume overload - multiple TTE's and TEEs as reported below. Last LVEF 35-40% by TTE in 07/2018,LVIDd 5.6 and LVIDs 4.3, mild LAE.  - discharged on Lasix 40mg  daily, lisinopril 5mg , TOprol 25mg , ASA 81, atorva 10,    - recent issues with fluid overload over the last few months. Partly due to some missed lasix  doses. Had an ER visit with pulm edema, did not require admission 04/2020 echo LVEF 50%, severe BAE, MV/AV VTI is 3 suggesting possible severe MR.    ER visit 04/2020 with volume overload, managed in ER with diuresis.  - admit 05/18/20 with volume overload, diuresed and dischaged. ProBNP was 7000. K 3.3 Cr 1. CXR with pulm edema - presented with abdominal distension, SOB.Had been compliant lasix 40mg  bid.      3. Automimmunedisease - [facility] had signs of bilateral SI joint scerlosis, possibly fistulous tract to rectum Concern for possible seronegative spondyloarhtritis and or IBD.  4. Left coronary cusp density - chronic byechoimaging.    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         Past Medical History:  Diagnosis Date  . Anemia   . Autoimmune disorder (HCC)    pyoderma gangrenosum  . Chronic systolic heart failure (HCC)   . DVT (deep venous thrombosis) (HCC)    h/o  . Mitral regurgitation   . Myocardial infarction (HCC)   . Seronegative spondylitis (HCC)    arthritis     No Known Allergies         Current Outpatient Medications  Medication Sig Dispense Refill  . ASPIRIN ADULT LOW STRENGTH 81 MG EC tablet Take 81 mg by mouth daily.     [facility]  ferrous sulfate 325 (65 FE) MG tablet Take 1 tablet (325 mg total) by mouth daily. 90 tablet 1  . furosemide (LASIX) 40 MG tablet Take 1 tablet (40 mg total) by mouth 2 (two) times daily. 180 tablet 1  . lisinopril (ZESTRIL) 10 MG tablet Take 1 tablet (10 mg total) by mouth daily. 90 tablet 2  . metoprolol succinate (TOPROL-XL) 25 MG 24 hr tablet Take 1 tablet (25 mg total) by mouth daily. 90 tablet 2  . potassium chloride SA (KLOR-CON) 20 MEQ  tablet Take 1 tablet (20 mEq total) by mouth daily. 90 tablet 3   No current facility-administered medications for this visit.          Past Surgical History:  Procedure Laterality Date  . HERNIA REPAIR Right 2018  . MITRAL VALVE REPLACEMENT  06/07/2018   [facility]     No Known Allergies    Family History  Problem Relation Age of Onset  . Multiple sclerosis Mother   . Psoriasis Mother   . Depression Father   . Diabetes Father   . Diabetes Paternal Grandmother      Social History Mr. Lemerise reports that he has quit smoking. He has never used smokeless tobacco. Mr. Carlone reports no history of alcohol use.   Review of Systems CONSTITUTIONAL: No weight loss, fever, chills, weakness or fatigue.  HEENT: Eyes: No visual loss, blurred vision, double vision or yellow sclerae.No hearing loss, sneezing, congestion, runny nose or sore throat.  SKIN: No rash or itching.  CARDIOVASCULAR: per hpi RESPIRATORY: per hpi GASTROINTESTINAL: No anorexia, nausea, vomiting or diarrhea. No abdominal pain or blood.  GENITOURINARY: No burning on urination, no polyuria NEUROLOGICAL: No headache, dizziness, syncope, paralysis, ataxia, numbness or tingling in the extremities. No change in bowel or bladder control.  MUSCULOSKELETAL: No muscle, back pain, joint pain or stiffness.  LYMPHATICS: No enlarged nodes. No history of splenectomy.  PSYCHIATRIC: No history of depression or anxiety.  ENDOCRINOLOGIC: No reports of sweating, cold or heat intolerance. No polyuria or polydipsia.  Marland Kitchen   Physical Examination    Today's Vitals   05/21/20 0835  BP: 108/68  Pulse: 70  SpO2: 100%  Weight: 214 lb (97.1 kg)  Height: 5\' 9"  (1.753 m)   Body mass index is 31.6 kg/m.  Gen: resting comfortably, no acute distress HEENT: no scleral icterus, pupils equal round and reactive, no palptable cervical adenopathy,  CV: RRR, 3/6 systolic murmur apex, mildly elevated  JVD Resp: Clear to auscultation bilaterally GI: abdomen is soft, non-tender, non-distended, normal bowel sounds, no hepatosplenomegaly MSK: extremities are warm, 1+ bilateral LE edema Skin: warm, no rash Neuro:  no focal deficits Psych: appropriate affect   Diagnostic Studies 06/06/18 Echo [facility] SUMMARY Mild left ventricular hypertrophy The left ventricle is mildly dilated. The apex is contracting normally. The rest of LV wall is hypokinetic. LV ejection fraction = 35-40%.  Left ventricular systolic function is moderately reduced. The right ventricle is normal in size and function. There is severe mitral regurgitation. There is mild mitral valve thickening. Suspect anterior mitral valve prolapse. Recommend TEE for further evaluation. Normal IVC size with decreased respiratory collapse. There is no pericardial effusion. There is no comparison study available.   06/06/2018 TEE [facility] SUMMARY There is severe prolapse (possible flail) of A3 scallop resulting in severe,  posteriorly directed regurgitation with a 0.5 cm gap in closure. Pulmonary  vein flow shows systolic reversal. Papillary muscles appear intact. No  vegetations seen. The left ventricular size  is normal.  Left ventricular systolic function is mildly reduced. The right ventricle is normal in size and function. Diffuse thickening of the aortic valve with preserved cusp opening. There is  an extremely small ( <2 mm) echodensity on the ventricular side of the aortic  valve--no significant aortic regurgitation; recommend clinical correlation. There is mild tricuspid regurgitation. No overt vegetations identified. 3D Echocardiography was performed and reviewed for assessment of cardiac  structure and function using a workstation. Findings were integrated into the  echo report.  06/07/18 Intraop TEE Pre-Intervention Summary An Omniplane TEE probe was inserted orally. A full TEE exam was performed  including 2D,  M-Mode, Color and Doppler. The TEE probe was placed  atraumatically on the first attempt. The left ventricle is severely dilated. There is normal left ventricular wall  thickness. Left ventricular systolic function is moderately reduced. The EF  is 30-35%. There is mild to moderate global hypokinesis of the left  ventricle. There is moderate to severe inferior wall hypokinesis. The right ventricle is moderate to severely dilated. The right ventricular  systolic function is mildly reduced. The interatrial septum is intact with no evidence for an atrial septal  defect. The left atrium is severely dilated. Right atrial size is normal. There is a ruptured chordae with flail of the A3 segment of the anterior  mitral leaflet. No significant mitral valve stenosis. There is severe mitral  regurgitation. Flow reversal noted in pulmonary veins consistent with  significant mitral regurgitation. The mitral regurgitant jet is posteriorly  directed, which is consistent with anterior leaflet pathology. The tricuspid valve is normal in structure and function. There is trace  tricuspid regurgitation. The aortic valve is trileaflet. The aortic valve opens well. There is a small  irregularity on the left coronary cusp adjacent to the right coronary cusp.  Cannot exclude aortic valvular vegetation. No hemodynamically significant  valvular aortic stenosis. Trace to mild AI with central jet. The pulmonic valve is normal in structure and function. > The aortic root is normal size. No obvious dissection could be visualized.  There is no pericardial effusion. There is no pleural effusion.  Post-Intervention Summary The EF remains 30-35%. There is moderate to severe inferior wall hypokinesis. The right ventricular systolic function is normal. The interatrial septum is intact with no evidence for an atrial septal defect. There is no mitral regurgitation noted. An annuloplasty ring is noted in the  mitral  position. There is trace tricuspid regurgitation. Trace to mild AI. The pulmonic valve is not well visualized. No obvious dissection could be visualized. There is no pericardial effusion.  There is no pleural effusion. MMode/2D Measurements &Calculations EDV(MOD-sp4): 72.3 ml ESV(MOD-sp4): 117.0 ml EDV(MOD-sp2): 257.0 ml ESV(MOD-sp2): 150.0 ml SV(MOD-sp4): -44.7 ml Pre-Intervention Doppler Measurements and Calculations MV max PG: 11.1 mmHg MV mean PG: 4.8 mmHg Ao V2 max: 87.0 cm/sec Ao max PG: 3.0 mmHg Ao V2 mean: 51.9 cm/sec Ao mean PG: 1.4 mmHg Ao V2 VTI: 13.4 cm   Overall TEE Interpretation Summary Pre-intervention exam: The LV is severey dilated with mild to moderate global  hypokinesis. The EF is 30-35%. There is moderate to severe hypokinesis of the  inferior wall. The RV systolic function is mildly reduced. There is a  ruptured chordae and flail of the A3 segment of the anterior mitral leaflet.  The posterior leaflet appears normal. Severe MR with posteriorly directed jet  and systolic flow reversal in the pulmonary veins. There is a small echodense  irregularity on the left coronary cusp of  the aortic valve adjacent to the  right coronary cusp. Vegetation cannot be ruled out. There is trace to mild  AI with a central jet. There is trace TR. There is no obvious aortic  dissection. There is no pericardial effusion. There is no pleural effusion. Post-intervention exam: The LV EF remains unchanged from the pre-intervention  exam. The RV systolic function has improved and is now normal. There is an  annuloplasty ring in the mitral position. There is no MR. Otherwise the exam  is unchanged from the pre-intervention exam. At the conclusion of the  procedure the probe was removed. There were no apparant complications related  to the TEE examination.   Jun 18 2018 echo SUMMARY There is normal left ventricular wall thickness. The left ventricle is mildly dilated.  Left  ventricular systolic function is mildly reduced. LV ejection fraction = 40-45%. Abnormal (paradoxical) septal motion consistent with post-operative status. There is hypokinesis of the basal to mid inferior wall. The right ventricle is normal in size and function. The left atrial size is normal. An annuloplasty ring is noted in the mitral position. The mean gradient across the mitral valve is 7.6 mmHg. There is no mitral regurgitation noted. The aortic sinus is normal in size. IVC size was normal. There is no pericardial effusion. Compared to the last surface echo dated 06/06/18, the LV systolic function is  slightly improved and the patient is s/p mitral valve repair.  Jul 28 2018 TTE SUMMARY The left ventricle is mildly dilated.  Left ventricular systolic function is moderately reduced. LV ejection fraction = 35-40%. The right ventricle is normal size. The right ventricular systolic function is mildly reduced. The left atrium is mildly dilated. There is mild aortic regurgitation. Status post mitral valve repair with annuloplasty ring There is mild to moderate mitral regurgitation. The mean gradient across the mitral valve is 9.8 mmHg. The heart rate for the mean mitral valve gradient is 92 BPM. There is mild tricuspid regurgitation. There was insufficient TR detected to calculate RV systolic pressure. Estimated right atrial pressure is 10 mmHg.Marland Kitchen There is no pericardial effusion.   Jul 28 2018 TEE - SUMMARY s/p Mitral valve repair with resection of ruptured anterior papillary muscle,  reconstruction of papillary chord with two Neo-chords and placement of  Simplici-T Annuloplasty ring. Noted a mobile echodensity on the posterior leaflet of the mitral valve,  which may represents surgical suture. Differential include torn mitral valve  chordae or vegetation. Compared to the post-op TEE, MR appears worse. Clinical correlation advised. There is mild aortic  regurgitation. A mobile echodensity noted on the left coronary cusp of the aortic valve  which was seen in the previous TEEs. Noted mild eccentric AI.   Jan 2020 CT scan 1. Soft tissue density in the anterior mediastinum adjacent to the median sternotomy which may simply be postoperative, but cannot exclude cellulitis. No drainable fluid collection. 2. Trace hydropneumopericardium as well as small locules of air in the anterior mediastinum inferiorly, most likely postoperative in nature. 3. Significant improved aeration when compared to prior study with residual atelectasis/scarring in the right lower lobe. There is residual airspace consolidation left lower lobe consistent with resultant discoid atelectasis or pneumonia. 4. . Filling defect in the right jugular vein extending into the right brachiocephalic vein as well as a more broad-based filling defect in the left brachiocephalic vein, consistent with thrombus. 5. Interval sternotomy and mitral valve replacement with expected postoperative changes.   06/07/18 surgery note PREOPERATIVE DIAGNOSIS: ICD-10 I34.0 Mitral  valve insufficiency  POSTOPERATIVE DIAGNOSIS: same  QUALITY MEASURES: 4047F Documentation of order for prophylactic antibiotics to be given within one hour (if fluoroquinolone or vancomycin, two hours) prior to surgical incision (or start of procedure when no incision is required) 4041F Documentation of order for cefazolin or cefuroxime for antimicrobial prophylaxis  HEMODYNAMICS AND CATH: Ejection fraction: 35%  VALVE DATA: Mitral Valve: Severe Insufficiency  PRIORITY: Emergent  INCIDENCE: First cardiovascular surgery   PROCEDURE: CPT Codes: 16109 * Mitral valve repair with resection of ruptured anterior papillary muscle, reconstruction of papillary chord with two Neo-chords and placement of Simplici-T Annuloplasty ring via superior septal approach Model: 670 Serial: [id number]  CARDIOPULMONARY BYPASS TIME:  131 min  AORTIC CROSS CLAMP TIME: 92 min  DESCRIPTION OF OPERATION After induction of anesthesia, the patient was prepped and draped. Before the incision was made, time-out was observed by all members of the surgical and anesthesia teams to identify the correct patient and procedure. The heart was exposed via a full conventional sternotomy. The anatomy of the heart and great vessels was normal. On palpation the aorta was normal.   Intraoperative transesophageal echocardiography showed the left ventricular function to be hypokinetic with an ejection fraction of 35%. Exam of the cardiac valves showed severe mitral regurgitation with anterior A2/A3 leaflet prolapse.  After heparinization, cardiopulmonary bypass was instituted. Arterial perfusion was via an arterial cannula placed in the aorta. For assisted venous return, drainage was from two cannuli placed through the right atrium into the superior and inferior vena cavi. Caval tapes were placed. The patient was cooled to a body core temperature of 36 degrees centigrade. The left ventricle was vented through the both aorta and right superior pulmonary vein.  The ascending aorta was cross-clamped and the heart was arrested with cold DelNido cardioplegia given antegrade. Once dose was adequate for the aforementioned cross clamp time. Topical cooling was used.  The mitral valve was exposed via a superior septal approach incision. The left atrium was carefully examined. There was no clot present. Retraction sutures were placed and the valve was well visualized. The valve was bicuspid and was diseased from an apparent degenerative etiology. The calcification of the leaflets was none and no calcification of the annulus. The valve was inspected and the defect found to be ruptured anterior papillary muscle causing the A2/A3 prolapse. The anterior leaflet was thickened. There were no vegetations or stigmata of endocarditis. The valve was amenable to repair.  The  ruptured papillary muscle was resected. The chord was then reconstructed utilizing two 5-0 Neo-chords. The valve was statically tested and was found to be competent without leak.  Thirteen sutures of 2-0 dacron were placed in simple mattress fashion in the annulus from trigone to trigone posteriorly. A Medtronic Simplici T band was chosen and the sutures passed through the band. The band was seated and the sutures secured via CorKnots. The valve was again statically tested and was found to have a small leak at the medial commissure. A commisuroplasty was performed utilizing 5-0 was performed. The valve then appeared to be hydrostatic.   The left ventricular vent was placed across the mitral valve. The left atrial and interatrial septal portions of the incisions were closed with running 4-0 Prolene. An aortic vent was placed to low suction and the cross clamp released. The right atrial portion of the incision was closed with a 4-0 Prolene suture. Caval tapes were released.  The patient was rewarmed. Cardioversion occurred spontaneously. The underlying rhythm was normal sinus. Atrial and  ventricular pacing was used. The patient was weaned and separated from cardiopulmonary bypass. After discontinuation of cardiopulmonary bypass the heart adequately supported the circulation. Inotropes were used upon leaving the operating room. The left ventricular function was moderatly hypokinetic. The right ventricular function was mildly hypokinetic. Post procedure intraoperative transesophageal echocardiogram demonstrated an ejection fraction of 35% and there was well-seated annuloplasty ring with no mitral regurgitation.  The bypass lines were removed and the Heparin reversed with Protamine. The pericardium was drained with one 24 Fr. Blake drain. The pleural space did not require a chest tube. The sternum was reapproximated with #7 and double stranded stainless steel wires. The linea alba was closed with #2 absorbable  sutures. The pectoralis fascia was closed with #0 absorbable sutures. Subcutaneous tissues were closed with 2-0 absorbable suture. The skin was closed with 3-0 absorbable subcuticular suture. Sterile dressings were placed over the wounds.  There were no complications and sponge, instrument, and needle counts were reported as correct. The patient was transported to the Intensive Care Unit in stable condition.    Assessment and Plan   1. Mitral regurgitation - complex history as described above with recent repair, appears to have recurrent MR. - by most recent TTE appears to be severe MR, has TEE for later this week - 2 recent ER/hospital visits for volume overload, pending TEE results will need close f/u with CT surgery to reconsider valve intervention.  - we will increase his lasix to 60mg  bid.     2. Chronic systolic HF - LVEF 35-40% by prior echo, most recent LVEF 50% but this is in the presence of what looks to be severe MR and thus an overestimation of true function - - 2 recent ER/hospital visits for volume overload - mildly volume overloaded today, increase lasix to 60mg  bid   Has TEE later this week, pending results will likely need very close f/u with CT surgery. Like at the point for a repeat valve intervention.     , M.D.

## 2020-05-23 NOTE — Progress Notes (Signed)
*  PRELIMINARY RESULTS* Echocardiogram Echocardiogram Transesophageal has been performed.  Donald [name] 05/23/2020, 11:16 AM

## 2020-05-23 NOTE — Anesthesia Postprocedure Evaluation (Signed)
Anesthesia Post Note  Patient: Donald [name]  Procedure(s) Performed: TRANSESOPHAGEAL ECHOCARDIOGRAM (TEE) WITH PROPOFOL (N/A )  Patient location during evaluation: PACU Anesthesia Type: General Level of consciousness: awake and alert and oriented Pain management: pain level controlled Vital Signs Assessment: post-procedure vital signs reviewed and stable Respiratory status: spontaneous breathing and respiratory function stable Cardiovascular status: blood pressure returned to baseline and stable Postop Assessment: no apparent nausea or vomiting Anesthetic complications: no   No complications documented.   Last Vitals:  Vitals:   05/23/20 1145 05/23/20 1200  BP: 106/84 115/81  Pulse:    Resp: (!) 29 (!) 30  SpO2: 100% 97%    Last Pain:  Vitals:   05/23/20 1200  TempSrc:   PainSc: 0-No pain                 Elfego Giammarino C Lailee Hoelzel

## 2020-05-23 NOTE — Progress Notes (Signed)
Confirmed with Dr Alva Garnet pt readiness for discharge- VSs, resp in 30's at times, pt states his respirations run high. MD states ok for D?C.

## 2020-05-23 NOTE — Discharge Instructions (Addendum)
Transesophageal Echocardiogram Transesophageal echocardiogram (TEE) is a test that uses sound waves to take pictures of your heart. TEE is done by passing a flexible tube down the esophagus. The esophagus is the tube that carries food from the throat to the stomach. The pictures give detailed images of your heart. This can help your doctor see if there are problems with your heart. What happens before the procedure? Staying hydrated Follow instructions from your doctor about hydration, which may include:  Up to 3 hours before the procedure - you may continue to drink clear liquids, such as: ? Water. ? Clear fruit juice. ? Black coffee. ? Plain tea.  Eating and drinking Follow instructions from your doctor about eating and drinking, which may include:  8 hours before the procedure - stop eating heavy meals or foods such as meat, fried foods, or fatty foods.  6 hours before the procedure - stop eating light meals or foods, such as toast or cereal.  6 hours before the procedure - stop drinking milk or drinks that contain milk.  3 hours before the procedure - stop drinking clear liquids. General instructions  You will need to take out any dentures or retainers.  Plan to have someone take you home from the hospital or clinic.  If you will be going home right after the procedure, plan to have someone with you for 24 hours.  Ask your doctor about: ? Changing or stopping your normal medicines. This is important if you take diabetes medicines or blood thinners. ? Taking over-the-counter medicines, vitamins, herbs, and supplements. ? Taking medicines such as aspirin and ibuprofen. These medicines can thin your blood. Do not take these medicines unless your doctor tells you to take them. What happens during the procedure?  To lower your risk of infection, your doctors will wash or clean their hands.  An IV will be put into one of your veins.  You will be given a medicine to help you  relax (sedative).  A medicine may be sprayed or gargled. This numbs the back of your throat.  Your blood pressure, heart rate, and breathing will be watched.  You may be asked to lay on your left side.  A bite block will be placed in your mouth. This keeps you from biting the tube.  The tip of the TEE probe will be placed into the back of your mouth.  You will be asked to swallow.  Your doctor will take pictures of your heart.  The probe and bite block will be taken out. The procedure may vary among doctors and hospitals. What happens after the procedure?   Your blood pressure, heart rate, breathing rate, and blood oxygen level will be watched until the medicines you were given have worn off.  When you first wake up, your throat may feel sore and numb. This will get better over time. You will not be allowed to eat or drink until the numbness has gone away.  Do not drive for 24 hours if you were given a medicine to help you relax. Summary  TEE is a test that uses sound waves to take pictures of your heart.  You will be given a medicine to help you relax.  Do not drive for 24 hours if you were given a medicine to help you relax. This information is not intended to replace advice given to you by your health care provider. Make sure you discuss any questions you have with your health care provider. Document Revised:   02/19/2018 Document Reviewed: 09/03/2016 Elsevier Patient Education  2020 Elsevier Inc[employer]  

## 2020-05-23 NOTE — Anesthesia Preprocedure Evaluation (Signed)
Anesthesia Evaluation  Patient identified by MRN, date of birth, ID band Patient awake    Reviewed: Allergy & Precautions, NPO status , Patient's Chart, lab work & pertinent test results, reviewed documented beta blocker date and time   History of Anesthesia Complications Negative for: history of anesthetic complications  Airway Mallampati: II  TM Distance: >3 FB Neck ROM: Full    Dental  (+) Teeth Intact, Dental Advisory Given   Pulmonary former smoker,    Pulmonary exam normal breath sounds clear to auscultation       Cardiovascular Exercise Tolerance: Poor Pt. on home beta blockers + Past MI and + DVT  + Valvular Problems/Murmurs (mitral valve repair - 05/2020) MR  Rhythm:Regular Rate:Normal + Systolic murmurs- Diastolic murmurs, - Friction Rub, - Carotid Bruit, - Peripheral Edema and - Systolic Click Please let the patient know that the pumping function of his heart has improved to 50% (previously 35-40% in 07/2018 and normal is 55-65%). Unfortunately, he does appear to have severe leakage along the mitral valve which was previously repaired. I did review this with Dr. Wyline Mood and he has recommended a transesophageal echocardiogram (with anesthesia assistance) for further evaluation of the heart valve. Given that the patient has an appointment within the next 2 weeks, I would recommend we plan to review the procedure in detail at that time and reassess his volume status   Neuro/Psych negative neurological ROS  negative psych ROS   GI/Hepatic negative GI ROS, Neg liver ROS,   Endo/Other  negative endocrine ROS  Renal/GU negative Renal ROS     Musculoskeletal   Abdominal   Peds  Hematology  (+) anemia ,   Anesthesia Other Findings   Reproductive/Obstetrics                             Anesthesia Physical Anesthesia Plan  ASA: IV  Anesthesia Plan: General   Post-op Pain Management:     Induction: Intravenous  PONV Risk Score and Plan: TIVA  Airway Management Planned: Nasal Cannula, Natural Airway and Simple Face Mask  Additional Equipment:   Intra-op Plan:   Post-operative Plan:   Informed Consent: I have reviewed the patients History and Physical, chart, labs and discussed the procedure including the risks, benefits and alternatives for the proposed anesthesia with the patient or authorized representative who has indicated his/her understanding and acceptance.     Dental advisory given  Plan Discussed with: CRNA and Surgeon  Anesthesia Plan Comments:         Anesthesia Quick Evaluation

## 2020-05-24 LAB — ECHOCARDIOGRAM COMPLETE
AR max vel: 2.35 cm2
AV Area VTI: 2.49 cm2
AV Area mean vel: 2.26 cm2
AV Mean grad: 3.3 mmHg
AV Peak grad: 6.6 mmHg
Ao pk vel: 1.28 m/s
Area-P 1/2: 2.8 cm2
S' Lateral: 4.42 cm

## 2020-05-25 ENCOUNTER — Telehealth: Payer: Self-pay | Admitting: *Deleted

## 2020-05-25 NOTE — Telephone Encounter (Signed)
-----   Message from [name], MD sent at 05/24/2020 11:26 AM EST ----- Please let patient know that the TEE we did yesterday shows that his mitral valve has become very leaky and is likely the primary cause of his symptoms. I would strongly suggest we refer him to Dr Cornelius Moras, a CT suregon in [place] who has a very high expertise in the mitral valve and surgical repairs or replacement. If he agrees please place referal,would want to get him in expedited please. How is his swelling doing? How is his breathing doing since with the higher lasix dosing   Dominga Ferry MD

## 2020-05-28 ENCOUNTER — Encounter (HOSPITAL_COMMUNITY): Payer: Self-pay | Admitting: Cardiology

## 2020-06-22 ENCOUNTER — Ambulatory Visit (INDEPENDENT_AMBULATORY_CARE_PROVIDER_SITE_OTHER): Payer: Self-pay | Admitting: Student

## 2020-06-22 ENCOUNTER — Other Ambulatory Visit: Payer: Self-pay

## 2020-06-22 ENCOUNTER — Encounter: Payer: Self-pay | Admitting: Student

## 2020-06-22 VITALS — BP 130/82 | HR 50 | Ht 69.0 in | Wt 213.0 lb

## 2020-06-22 DIAGNOSIS — I340 Nonrheumatic mitral (valve) insufficiency: Secondary | ICD-10-CM

## 2020-06-22 DIAGNOSIS — I5022 Chronic systolic (congestive) heart failure: Secondary | ICD-10-CM

## 2020-06-22 DIAGNOSIS — D8989 Other specified disorders involving the immune mechanism, not elsewhere classified: Secondary | ICD-10-CM

## 2020-06-22 MED ORDER — FUROSEMIDE 40 MG PO TABS: 40.0000 mg | ORAL_TABLET | Freq: Two times a day (BID) | ORAL | 3 refills | Status: DC

## 2020-06-22 NOTE — Patient Instructions (Signed)
Medication Instructions:  Your physician recommends that you continue on your current medications as directed. Please refer to the Current Medication list given to you today.  *If you need a refill on your cardiac medications before your next appointment, please call your pharmacy*   Lab Work: None Today If you have labs (blood work) drawn today and your tests are completely normal, you will receive your results only by: Marland Kitchen MyChart Message (if you have MyChart) OR . A paper copy in the mail If you have any lab test that is abnormal or we need to change your treatment, we will call you to review the results.   Testing/Procedures: None Today   Follow-Up: At [facility], you and your health needs are our priority.  As part of our continuing mission to provide you with exceptional heart care, we have created designated Provider Care Teams.  These Care Teams include your primary Cardiologist (physician) and Advanced Practice Providers (APPs -  Physician Assistants and Nurse Practitioners) who all work together to provide you with the care you need, when you need it.  We recommend signing up for the patient portal called "MyChart".  Sign up information is provided on this After Visit Summary.  MyChart is used to connect with patients for Virtual Visits (Telemedicine).  Patients are able to view lab/test results, encounter notes, upcoming appointments, etc.  Non-urgent messages can be sent to your provider as well.   To learn more about what you can do with MyChart, go to [email or web address].    Your next appointment:   3 month(s)  The format for your next appointment:   In Person  Provider:   Dina Rich, MD   Other Instructions CT Surgery referral to Dr. Cornelius Moras

## 2020-06-22 NOTE — Progress Notes (Addendum)
Cardiology Office Note    Date:  06/22/2020   ID:  Donald [name] [birth year], MRN [id number]  PCP:  Patient, No Pcp Per  Cardiologist: Dina Rich, MD    Chief Complaint  Patient presents with  . Follow-up    s/p TEE    History of Present Illness:    Donald [name] is a 35 y.o. 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   He was last examined by Dr. Wyline [name] on 05/21/2020 and had been experiencing worsening dyspnea and abdominal distension which had led to multiple Emergency Department visits. His Lasix was increased to 60mg  BID and he already had plans for a repeat TEE later that week. His TEE was performed on 05/23/2020 and showed an EF of 40-45% with severe biatrial dilation, moderate to severely reduced RV function, severe MR, moderate MS, and moderate to severe TR. It was recommended he be referred to Dr. 14/01/2020 with CT Surgery and office staff tried to contact him multiple times about this but it appears they were unable to get in touch with the patient.   In talking with the patient today, he reports overall doing very well since his last visit. He has not presented back to the ED for recurrent respiratory issues. He says that his breathing has been stable and he denies any orthopnea, PND or lower extremity edema. No recent chest pain or palpitations. He was previously prescribed Lasix 60 mg twice daily and has currently been taking 40 mg daily and will take an  extra 40 mg if needed.   He reports smoking Marijuana on a daily basis but denies any other substance use. No reported history of IVDU.   He has been experiencing phone issues and recommended we contact his mother who is listed as his emergency contact if needing to get in touch with him in the future regarding appointments.    Past Medical History:  Diagnosis Date  . Anemia   . Autoimmune disorder (HCC)    pyoderma gangrenosum  . Chronic systolic heart failure (HCC)    a. EF 35-40% by echo in 07/2018 b. EF at 45% by repeat echo in 04/2020  . DVT (deep venous thrombosis) (HCC)    h/o  . Mitral regurgitation    a. s/p MV repair with resection of ruptured anterior papillary muscle and reconstruction of papillary chord and placement of annuloplasty ringin 2019  . Myocardial infarction (HCC)   . Seronegative spondylitis (HCC)    arthritis    Past Surgical History:  Procedure Laterality Date  . HERNIA REPAIR Right 2018  . MITRAL VALVE REPLACEMENT  06/07/2018   [facility]  . TEE WITHOUT CARDIOVERSION N/A 05/23/2020   Procedure: TRANSESOPHAGEAL ECHOCARDIOGRAM (TEE) WITH PROPOFOL;  Surgeon: 14/01/2020, MD;  Location: [facility];  Service: Endoscopy;  Laterality: N/A;    Current Medications: Outpatient Medications Prior to Visit  Medication Sig Dispense Refill  . ASPIRIN ADULT LOW STRENGTH 81 MG EC tablet Take 81 mg by mouth daily.     . ferrous sulfate 325 (  65 FE) MG tablet Take 1 tablet (325 mg total) by mouth daily. 90 tablet 1  . lisinopril (ZESTRIL) 10 MG tablet Take 1 tablet (10 mg total) by mouth daily. 90 tablet 2  . metoprolol succinate (TOPROL-XL) 25 MG 24 hr tablet Take 1 tablet (25 mg total) by mouth daily. 90 tablet 2  . potassium chloride SA (KLOR-CON) 20 MEQ tablet Take 1 tablet (20 mEq total) by mouth daily. 90 tablet 3  . furosemide (LASIX) 40 MG tablet Take 1.5 tablets (60 mg total) by mouth 2 (two) times daily. 180 tablet 3    Facility-Administered Medications Prior to Visit  Medication Dose Route Frequency Provider Last Rate Last Admin  . sodium chloride flush (NS) 0.9 % injection 3 mL  3 mL Intravenous Q12H Branch, Alphonse Guild, MD         Allergies:   Patient has no known allergies.   Social History   Socioeconomic History  . Marital status: Married    Spouse name: Not on file  . Number of children: Not on file  . Years of education: Not on file  . Highest education level: Not on file  Occupational History  . Not on file  Tobacco Use  . Smoking status: Former Research scientist (life sciences)  . Smokeless tobacco: Never Used  Vaping Use  . Vaping Use: Not on file  Substance and Sexual Activity  . Alcohol use: No  . Drug use: No  . Sexual activity: Not on file  Other Topics Concern  . Not on file  Social History Narrative  . Not on file   Social Determinants of Health   Financial Resource Strain: Not on file  Food Insecurity: Not on file  Transportation Needs: Not on file  Physical Activity: Not on file  Stress: Not on file  Social Connections: Not on file     Family History:  The patient's family history includes Depression in his father; Diabetes in his father and paternal grandmother; Multiple sclerosis in his mother; Psoriasis in his mother.   Review of Systems:   Please see the history of present illness.     General:  No chills, fever, night sweats or weight changes.  Cardiovascular:  No chest pain, edema, orthopnea, palpitations, paroxysmal nocturnal dyspnea. Positive for dyspnea on exertion (improved).  Dermatological: No rash, lesions/masses Respiratory: No cough, dyspnea Urologic: No hematuria, dysuria Abdominal:   No nausea, vomiting, diarrhea, bright red blood per rectum, melena, or hematemesis Neurologic:  No visual changes, wkns, changes in mental status. All other systems reviewed and are otherwise negative except as noted above.   Physical Exam:    VS:  BP 130/82   Pulse (!) 50   Ht 5'  9" (1.753 m)   Wt 213 lb (96.6 kg)   SpO2 98%   BMI 31.45 kg/m    General: Well developed, well nourished,male appearing in no acute distress. Head: Normocephalic, atraumatic. Neck: No carotid bruits. JVD not elevated.  Lungs: Respirations regular and unlabored, without wheezes or rales.  Heart: Regular rate and rhythm. No S3 or S4. 3/6 systolic murmur along Apex.  Abdomen: Appears non-distended. No obvious abdominal masses. Msk:  Strength and tone appear normal for age. No obvious joint deformities or effusions. Extremities: No clubbing or cyanosis. Trace ankle edema bilaterally.  Distal pedal pulses are 2+ bilaterally. Neuro: Alert and oriented X 3. Moves all extremities spontaneously. No focal deficits noted. Psych:  Responds to questions appropriately with a normal affect. Skin: No rashes or lesions noted  Wt Readings from Last 3 Encounters:  06/22/20 213 lb (96.6 kg)  05/21/20 214 lb (97.1 kg)  04/11/20 215 lb 9.6 oz (97.8 kg)      Studies/Labs Reviewed:   EKG:  EKG is not ordered today.    Recent Labs: No results found for requested labs within last 8760 hours.   Lipid Panel No results found for: CHOL, TRIG, HDL, CHOLHDL, VLDL, LDLCALC, LDLDIRECT  Additional studies/ records that were reviewed today include:   Echocardiogram: 04/19/2020 IMPRESSIONS    1. Left ventricular ejection fraction, by estimation, is 45%. The left  ventricle has mildly decreased function. The left ventricle demonstrates  global hypokinesis. There is mild left ventricular hypertrophy. Left  ventricular diastolic parameters are  indeterminate.  2. Ventricular septum is flattened in systole and diastole suggesting RV  pressure and volume overload. RV appears mild to moderately enlarged with  mild to moderate dysfunction. Right ventricular systolic function mild to  moderately reduced. The right  ventricular size is mild to moderately enlarged.  3. Left atrial size was severely dilated.   4. Right atrial size was severely dilated.  5. Prior mitral valve repair. The MR jet is turbulent and poorly  visualized making it difficult to quantify. The MV to AV VTI is 3  suggesting possible severe MR. Elevated gradient across the MV repair of  10 mmHg. There is a linear density that crosses  the anulus that is difficult to distinguish, perhaps portion of the  repair. Recommend TEE to better evaluate anatomy and function of mitral  valve. . The mitral valve has been repaired/replaced. difficult to  quantify. At least moderate. mitral valve  regurgitation. The mean mitral valve gradient is 10.0 mmHg.  6. Tricuspid valve regurgitation is mild to moderate.  7. The aortic valve is tricuspid. Aortic valve regurgitation is mild. No  aortic stenosis is present.  8. Moderate pulmonary HTN, PASP is 46 mmHg.  9. The inferior vena cava is dilated in size with >50% respiratory  variability, suggesting right atrial pressure of 8 mmHg.   TEE: 05/23/2020 IMPRESSIONS    1. Left ventricular ejection fraction, by estimation, is 40 to 45%. The  left ventricle has mildly decreased function.  2. Right ventricular systolic function moderately to severely reduced.  The right ventricular size is moderately enlarged.  3. Left atrial size was severely dilated. No left atrial/left atrial  appendage thrombus was detected. The LAA emptying velocity was 60 cm/s.  4. Right atrial size was severely dilated.  5. Difficult visualization of mitral valve and regurgitant jet. Findings  consistent with prior repair. Thickened leaflets with restricted motion.  The MR jet was not amenable to PISA. Moderate mean gradient across the  valve of 6 mmHg. In images #[id number]  there appears to be regurgitation jet through the valve but also lateral  to the anular ring. The cumulative regurgitation appears to be severe. .  The mitral valve has been repaired/replaced. Severe mitral valve  regurgitation. Moderate  mitral stenosis.  6. The tricuspid valve is abnormal. Tricuspid valve regurgitation is  moderate to severe.  7. The aortic valve is tricuspid. Aortic valve regurgitation is not  visualized. No aortic stenosis is present.  8. Technically difficult study, standard views were off axis particularly  of the mitral valve.   Assessment:    1. Mitral valve insufficiency, unspecified etiology   2. Chronic systolic HF (heart failure) (HCC)   3. Autoimmune disorder (HCC)      Plan:   In order  of problems listed above:  1. Severe Mitral Regurgitation - He is s/p MV repair with resection of ruptured anterior papillary muscle and reconstruction of papillary chord and placement of annuloplasty ringin 2019. Recent TEE last month showed severe MR, moderate MS, and moderate to severe TR.  - Will refer to Dr. Cornelius Moras with CT Surgery as previously recommended by Dr. Wyline [name]. I will also send a message to the CT Surgery scheduler today so they are aware to reach out to his other listed contacts if unable to reach him given his phone issues as outlined above.   2. Chronic Systolic CHF - His EF was previously reduced at 35-40% by echo in 07/2018, at 45% by repeat echo in 04/2020. TEE did show his EF at 40-45% with moderate to severely reduced RV function.  - His volume status has significantly improved since his last visit and his breathing is back to baseline. Lungs are clear on examination and he only has trace lower extremity edema.  - Will continue current medication regimen with Lisinopril 10mg  daily and Toprol-XL 25mg  daily. He will continue on Lasix 40mg  daily and does take an extra tablet if needed for weight gain or edema. We reviewed the importance of limiting his sodium intake.   3. Autoimmune Disorder - He has known seronegative spondylitis and pyoderma gangrenosum which is followed by Rheumatology.    Medication Adjustments/Labs and Tests Ordered: Current medicines are reviewed at length with  the patient today.  Concerns regarding medicines are outlined above.  Medication changes, Labs and Tests ordered today are listed in the Patient Instructions below. Patient Instructions  Medication Instructions:  Your physician recommends that you continue on your current medications as directed. Please refer to the Current Medication list given to you today.  *If you need a refill on your cardiac medications before your next appointment, please call your pharmacy*   Lab Work: None Today If you have labs (blood work) drawn today and your tests are completely normal, you will receive your results only by: MyChart Message (if you have MyChart) OR . A paper copy in the mail If you have any lab test that is abnormal or we need to change your treatment, we will call you to review the results.   Testing/Procedures: None Today   Follow-Up: At [facility], you and your health needs are our priority.  As part of our continuing mission to provide you with exceptional heart care, we have created designated Provider Care Teams.  These Care Teams include your primary Cardiologist (physician) and Advanced Practice Providers (APPs -  Physician Assistants and Nurse Practitioners) who all work together to provide you with the care you need, when you need it.  We recommend signing up for the patient portal called "MyChart".  Sign up information is provided on this After Visit Summary.  MyChart is used to connect with patients for Virtual Visits (Telemedicine).  Patients are able to view lab/test results, encounter notes, upcoming appointments, etc.  Non-urgent messages can be sent to your provider as well.   To learn more about what you can do with MyChart, go to .    Your next appointment:   3 month(s)  The format for your next appointment:   In Person  Provider:   Marland Kitchen, MD   Other Instructions CT Surgery referral to Dr. [facility]     Signed, [email or web address], PA-C  06/22/2020 3:23 PM    [facility] [address]  [place], [place] [address] Phone: [phone] Fax: [phone]

## 2020-07-10 NOTE — Telephone Encounter (Signed)
Have tried multiples times to reach pt

## 2020-07-16 ENCOUNTER — Ambulatory Visit: Payer: Self-pay | Admitting: Cardiology

## 2020-07-19 NOTE — Telephone Encounter (Signed)
Error

## 2020-07-23 ENCOUNTER — Encounter: Payer: Self-pay | Admitting: Thoracic Surgery (Cardiothoracic Vascular Surgery)

## 2020-07-23 ENCOUNTER — Encounter: Payer: Medicaid Other | Admitting: Thoracic Surgery (Cardiothoracic Vascular Surgery)

## 2020-07-30 ENCOUNTER — Encounter: Payer: Self-pay | Admitting: Thoracic Surgery (Cardiothoracic Vascular Surgery)

## 2020-08-03 ENCOUNTER — Encounter: Payer: Self-pay | Admitting: Thoracic Surgery (Cardiothoracic Vascular Surgery)

## 2020-08-03 ENCOUNTER — Other Ambulatory Visit: Payer: Self-pay

## 2020-08-03 ENCOUNTER — Institutional Professional Consult (permissible substitution) (INDEPENDENT_AMBULATORY_CARE_PROVIDER_SITE_OTHER): Payer: Self-pay | Admitting: Thoracic Surgery (Cardiothoracic Vascular Surgery)

## 2020-08-03 VITALS — BP 123/87 | HR 63 | Resp 20 | Ht 69.0 in | Wt 207.0 lb

## 2020-08-03 DIAGNOSIS — M4690 Unspecified inflammatory spondylopathy, site unspecified: Secondary | ICD-10-CM | POA: Insufficient documentation

## 2020-08-03 DIAGNOSIS — I071 Rheumatic tricuspid insufficiency: Secondary | ICD-10-CM | POA: Insufficient documentation

## 2020-08-03 DIAGNOSIS — I361 Nonrheumatic tricuspid (valve) insufficiency: Secondary | ICD-10-CM

## 2020-08-03 DIAGNOSIS — I5023 Acute on chronic systolic (congestive) heart failure: Secondary | ICD-10-CM | POA: Insufficient documentation

## 2020-08-03 DIAGNOSIS — I340 Nonrheumatic mitral (valve) insufficiency: Secondary | ICD-10-CM

## 2020-08-03 DIAGNOSIS — D8989 Other specified disorders involving the immune mechanism, not elsewhere classified: Secondary | ICD-10-CM | POA: Insufficient documentation

## 2020-08-03 DIAGNOSIS — I5022 Chronic systolic (congestive) heart failure: Secondary | ICD-10-CM | POA: Insufficient documentation

## 2020-08-03 NOTE — Progress Notes (Addendum)
[address]       Donald [name] [address]             [phone]     CARDIOTHORACIC SURGERY CONSULTATION REPORT  Primary Cardiologist is Donald Rich, MD PCP is Donald [name], Donald [name]  Chief Complaint  Donald [name] presents with  . 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  Donald [name] states that he had been healthy all of his life until 2019 when he developed fairly rapid progression of symptoms of exertional shortness of breath and fluid overload. He denied any previous history of heart murmur or rheumatic fever. He presented with acute hypoxemic respiratory failure in December 2019 to [facility] in [place] requiring intubation for mechanical ventilation. He suffered cardiac arrest on induction of anesthesia. Subsequent echocardiogram revealed acute severe mitral regurgitation with anterior leaflet prolapse. He was taken for urgent mitral valve repair by Dr. Emelda [name] on June 07, 2018 where there was reported evidence of ruptured anterior papillary muscle. This was repaired using artificial Gore-Tex neocords and Simplici-T annuloplasty ring via superior septal approach. The Donald [name] initially did well but had rapid recurrence of mitral regurgitation and congestive heart failure. He has  developed chronic combined systolic and diastolic congestive heart failure which has been intermittently stable on medical therapy but occasionally associated with recurrent acute exacerbations. He was last seen by Dr. Emelda [name] in June 2020 at which time the Donald [name] was reportedly stable on medical therapy. There has been some recorded question of autoimmune disorder. The Donald [name] was treated with Humira for chronic skin lesions for a period of time but this was stopped more than 6 months ago. The Donald [name] underwent GI consultation because of chronic diarrhea and question of possible Crohn's disease. However, despite stopping Humira prior to evaluation the Donald [name]'s colonoscopy and all biopsies were negative.   The Donald [name] has been followed by Donald [name] who last saw the Donald [name] in December 2021. Donald [name] has had multiple episodes of acute exacerbation of fluid overload with at least one ER visit for acute pulmonary edema although he did not require hospitalization. Follow-up transthoracic echocardiogram performed April 19, 2020 revealed decreased left ventricular systolic function with ejection fraction estimated 45% in the setting of severe mitral regurgitation. There was global left ventricular hypokinesis. The ventricular septum was notably flattened in systole suggesting right ventricular pressure and volume overload. There is mild to moderate right ventricular dysfunction and chamber enlargement. There was likely severe mitral regurgitation with elevated transvalvular gradient across the valve. TEE was recommended. Transesophageal echocardiogram performed May 23, 2020 confirmed the presence of decreased left ventricular systolic function with ejection fraction estimated 40 to 45% in the setting of severe mitral regurgitation. There was restricted leaflet motion. There were multiple jets of regurgitation with likely severe mitral regurgitation and possible jet of regurgitation lateral to the annuloplasty  ring. There was severe left and right atrial enlargement. There was moderate to severe tricuspid regurgitation. Several weeks ago  the Donald [name] had another ER visit with acute exacerbation of symptoms associated with paroxysmal atrial flutter which broke back to sinus rhythm on intravenous Cardizem. Eliquis was recommended but apparently Donald [name] never filled the prescription. Cardiothoracic surgical consultation was requested.  Donald [name] is single and lives in [place] by himself. He is accompanied by his father for his office visit today. His parents live close by. 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 Since his surgery he has tried to go back to work on several occasions but he has not been able to keep a job due to his severe exercise intolerance. He describes longstanding exertional shortness of breath dating back to the time of his previous surgery. He has had multiple acute exacerbations of shortness of breath, orthopnea, and lower extremity edema with fluid overload over the last 6 months. He checks his weight daily and keep a log. He adjusts his Lasix dose accordingly but states that sometimes his fluid medicine "just does not work". He has not had any dizzy spells or syncope. At present his symptoms are stable and occur only with moderate level activity. He is comfortable with low-level activity and at rest. Currently he can lay flat in bed. He does not complain of lower extremity edema over the last few weeks. Appetite is good. He has chronic loose bowel movements with Donald history of hematochezia. He denies any fevers or chills. He has not had productive cough, hemoptysis, nor wheezing. He has never been vaccinated against the COVID-19 virus.   Past Medical History:  Diagnosis Date  . Anemia   . Autoimmune disorder (HCC)    pyoderma gangrenosum  . Chronic systolic heart failure (HCC)    a. EF 35-40% by echo in 07/2018 b. EF at 45% by repeat echo in 04/2020  . DVT (deep  venous thrombosis) (HCC)    h/o  . Mitral regurgitation    a. s/p MV repair with resection of ruptured anterior papillary muscle and reconstruction of papillary chord and placement of annuloplasty ringin 2019  . Myocardial infarction (HCC)   . Seronegative spondylitis (HCC)    arthritis    Past Surgical History:  Procedure Laterality Date  . HERNIA REPAIR Right 2018  . MITRAL VALVE REPAIR  06/07/2018   [facility] - Dr Meda Klinefelter  . TEE WITHOUT CARDIOVERSION N/A 05/23/2020   Procedure: TRANSESOPHAGEAL ECHOCARDIOGRAM (TEE) WITH PROPOFOL;  Surgeon: [name], MD;  Location: [facility];  Service: Endoscopy;  Laterality: N/A;    Family History  Problem Relation Age of Onset  . Multiple sclerosis Mother   . Psoriasis Mother   . Depression Father   . Diabetes Father   . Diabetes Paternal Grandmother     Social History   Socioeconomic History  . Marital status: Married    Spouse name: Not on file  . Number of children: Not on file  . Years of education: Not on file  . Highest education level: Not on file  Occupational History  . Not on file  Tobacco Use  . Smoking status: Former Games developer  . Smokeless tobacco: Never Used  Vaping Use  . Vaping Use: Not on file  Substance and Sexual Activity  . Alcohol use: Donald  . Drug use: Donald  . Sexual activity: Not on file  Other Topics Concern  . Not on file  Social History Narrative  . Not on file   Social Determinants of Health   Financial Resource Strain: Not on file  Food Insecurity: Not on file  Transportation Needs: Not on file  Physical Activity: Not on file  Stress: Not on file  Social Connections: Not on file  Intimate Partner Violence: Not on file    Current Outpatient Medications  Medication Sig Dispense Refill  . ASPIRIN ADULT LOW STRENGTH 81 MG EC tablet Take 81 mg by mouth daily.     . ferrous sulfate 325 (65 FE) MG tablet Take 1 tablet (325 mg total) by mouth daily. 90 tablet 1  .  furosemide (LASIX) 40 MG tablet Take 1 tablet (40 mg total) by mouth 2 (two) times daily. 180 tablet 3  . metoprolol succinate (TOPROL-XL) 25 MG 24 hr tablet Take 1 tablet (25 mg total) by mouth daily. 90 tablet 2  . potassium chloride SA (KLOR-CON) 20 MEQ tablet Take 1 tablet (20 mEq total) by mouth daily. 90 tablet 3  . lisinopril (ZESTRIL) 10 MG tablet Take 1 tablet (10 mg total) by mouth daily. 90 tablet 2   Current Facility-Administered Medications  Medication Dose Route Frequency Provider Last Rate Last Admin  . sodium chloride flush (NS) 0.9 % injection 3 mL  3 mL Intravenous Q12H Branch, Dorothe Pea, MD        Donald Known Allergies    Review of Systems:   General:  normal appetite, normal energy, Donald weight gain, Donald weight loss, Donald fever  Cardiac:  Donald chest pain with exertion, Donald chest pain at rest, +SOB with exertion, Donald resting SOB, Donald PND, Donald orthopnea, Donald palpitations, Donald arrhythmia, + atrial fibrillation, Donald LE edema, Donald dizzy spells, Donald syncope  Respiratory:  + chronic exertional shortness of breath, Donald home oxygen, Donald productive cough, Donald dry cough, Donald bronchitis, Donald wheezing, Donald hemoptysis, Donald asthma, Donald pain with inspiration or cough, Donald sleep apnea, Donald CPAP at night  GI:   Donald difficulty swallowing, Donald reflux, Donald frequent heartburn, Donald hiatal hernia, Donald abdominal pain, Donald constipation, + chronic diarrhea, Donald hematochezia, Donald hematemesis, Donald melena  GU:   Donald dysuria,  Donald frequency, Donald urinary tract infection, Donald hematuria, Donald enlarged prostate, Donald kidney stones, Donald kidney disease  Vascular:  Donald pain suggestive of claudication, Donald pain in feet, Donald leg cramps, Donald varicose veins, Donald DVT, Donald non-healing foot ulcer  Neuro:   Donald stroke, Donald TIA's, Donald seizures, Donald headaches, Donald temporary blindness one eye,  Donald slurred speech, Donald peripheral neuropathy, Donald chronic pain, Donald instability of gait, Donald memory/cognitive dysfunction  Musculoskeletal: + arthritis, Donald joint swelling, Donald myalgias, Donald  difficulty walking, normal mobility   Skin:   Donald rash, Donald itching, Donald skin infections, Donald pressure sores or ulcerations  Psych:   Donald anxiety, Donald depression, Donald nervousness, Donald unusual recent stress  Eyes:   Donald blurry vision, Donald floaters, Donald recent vision changes, does not wears glasses or contacts  ENT:   Donald hearing loss, Donald loose or painful teeth, Donald dentures, last saw dentist many years ago  Hematologic:  Donald easy bruising, Donald abnormal bleeding, Donald clotting disorder, Donald frequent epistaxis  Endocrine:  Donald diabetes, does not check CBG[facility] at [facility]     Physical Exam:   BP 123/87 (BP Location: Left Arm, Donald [name] Position: Sitting)   Pulse 63   Resp 20   Ht 5\' 9"  (1.753 m)   Wt 207 lb (93.9 kg)   SpO2 98% Comment: RA  BMI 30.57 kg/m   General:    well-appearing  HEENT:  Unremarkable   Neck:  Donald JVD, Donald bruits, Donald adenopathy   Chest:   clear to auscultation, symmetrical breath sounds, Donald wheezes, Donald rhonchi   CV:   RRR, grade IV/VI holosystolic murmur best at apex   Abdomen:  soft, non-tender, Donald masses   Extremities:  warm, well-perfused, pulses palpabld, Donald LE edema  Rectal/GU  Deferred  Neuro:   Grossly non-focal and symmetrical throughout  Skin:   Clean and dry, Donald rashes, Donald breakdown   Diagnostic Tests:  ECHOCARDIOGRAM REPORT       Donald [name] Name:  Donald [name] Date of Exam: 04/19/2020  Medical Rec #: [id number]      Height:    69.0 in  Accession #:  [id number]     Weight:    215.6 lb  Date of Birth: Nov 17, 1985     BSA:     2.133 m  Donald [name] Age:  33 years      BP:      138/100 mmHg  Donald [name] Gender: M          HR:      77 bpm.  [facility] Location: [facility]   Procedure: 2D Echo, Cardiac Doppler and Color Doppler   Indications:   Z98.890 (ICD-10-CM) - Status post mitral valve repair    History:     Donald [name] has Donald prior history of Echocardiogram  examinations.          Previous Myocardial  Infarction; Mitral Valve Disease.  Chronic          systolic heart failure (HCC) (From Hx), s/p MV repair  with          resection of ruptured anterior papillary muscle and          reconstruction of papillary chord and placement of  annuloplasty          ring in 2019.    Sonographer:   Celesta Gentile RCS  Referring Phys: [id number] Ellsworth Lennox  Diagnosing Phys: Donald Rich MD   IMPRESSIONS    1. Left ventricular ejection fraction, by estimation, is 45%. The left  ventricle has mildly decreased function. The left ventricle demonstrates  global hypokinesis. There is mild left ventricular hypertrophy. Left  ventricular diastolic parameters are  indeterminate.  2. Ventricular septum is flattened in systole and diastole suggesting RV  pressure and volume overload. RV appears mild to moderately enlarged with  mild to moderate dysfunction. Right ventricular systolic function mild to  moderately reduced. The right  ventricular size is mild to moderately enlarged.  3. Left atrial size was severely dilated.  4. Right atrial size was severely dilated.  5. Prior mitral valve repair. The MR jet is turbulent and poorly  visualized making it difficult to quantify. The MV to AV VTI is 3  suggesting possible severe MR. Elevated gradient across the MV repair of  10 mmHg. There is a linear density that crosses  the anulus that is difficult to distinguish, perhaps portion of the  repair. Recommend TEE to better evaluate anatomy and function of mitral  valve. . The mitral valve has been repaired/replaced. difficult to  quantify. At least moderate. mitral valve  regurgitation. The mean mitral valve gradient is 10.0 mmHg.  6. Tricuspid valve regurgitation is mild to moderate.  7. The aortic valve is tricuspid. Aortic valve regurgitation is mild. Donald  aortic stenosis is present.  8. Moderate pulmonary HTN, PASP is 46 mmHg.  9. The inferior vena  cava is dilated in size with >50% respiratory  variability, suggesting  right atrial pressure of 8 mmHg.   FINDINGS  Left Ventricle: Left ventricular ejection fraction, by estimation, is  45%. The left ventricle has mildly decreased function. The left ventricle  demonstrates global hypokinesis. The left ventricular internal cavity size  was normal in size. There is mild  left ventricular hypertrophy. Left ventricular diastolic parameters are  indeterminate.   Right Ventricle: Ventricular septum is flattened in systole and diastole  suggesting RV pressure and volume overload. RV appears mild to moderately  enlarged with mild to moderate dysfunction. The right ventricular size is  mild to moderately enlarged.  Right vetricular wall thickness was not assessed. Right ventricular  systolic function mild to moderately reduced.   Left Atrium: Left atrial size was severely dilated.   Right Atrium: Right atrial size was severely dilated.   Pericardium: There is Donald evidence of pericardial effusion.   Mitral Valve: Prior mitral valve repair. The MR jet is turbulent and  poorly visualized making it difficult to quantify. The MV to AV VTI is 3  suggesting possible severe MR. Elevated gradient across the MV repair of  10 mmHg. There is a linear density that  crosses the anulus that is difficult to distinguish, perhaps portion of  the repair. Recommend TEE to better evaluate anatomy and function of  mitral valve. The mitral valve has been repaired/replaced. Difficult to  quantify. At least moderate. mitral  valve regurgitation. MV peak gradient, 29.8 mmHg. The mean mitral valve  gradient is 10.0 mmHg.   Tricuspid Valve: The tricuspid valve is normal in structure. Tricuspid  valve regurgitation is mild to moderate. Donald evidence of tricuspid  stenosis.   Aortic Valve: The aortic valve is tricuspid. Aortic valve regurgitation is  mild. Donald aortic stenosis is present. Aortic valve mean gradient  measures  3.3 mmHg. Aortic valve peak gradient measures 6.6 mmHg. Aortic valve area,  by VTI measures 2.49 cm.   Pulmonic Valve: The pulmonic valve was not well visualized. Pulmonic valve  regurgitation is mild. Donald evidence of pulmonic stenosis.   Aorta: The aortic root is normal in size and structure.   Pulmonary Artery: Moderate pulmonary HTN, PASP is 46 mmHg.   Venous: The inferior vena cava is dilated in size with greater than 50%  respiratory variability, suggesting right atrial pressure of 8 mmHg.   IAS/Shunts: The interatrial septum was not well visualized.     LEFT VENTRICLE  PLAX 2D  LVIDd:     5.98 cm Diastology  LVIDs:     4.42 cm LV e' medial: 5.22 cm/s  LV PW:     1.25 cm LV e' lateral: 5.77 cm/s  LV IVS:    1.08 cm  LVOT diam:   2.10 cm  LV SV:     54  LV SV Index:  25  LVOT Area:   3.46 cm     RIGHT VENTRICLE  RV S prime:   8.44 cm/s  TAPSE (M-mode): 1.4 cm   LEFT ATRIUM       Index    RIGHT ATRIUM      Index  LA diam:    5.80 cm 2.72 cm/m RA Area:   24.10 cm  LA Vol (A2C):  90.4 ml 42.38 ml/m RA Volume:  85.00 ml 39.85 ml/m  LA Vol (A4C):  103.0 ml 48.29 ml/m  LA Biplane Vol: 98.0 ml 45.94 ml/m  AORTIC VALVE  AV Area (Vmax):  2.35 cm  AV Area (Vmean):  2.26 cm  AV Area (VTI):  2.49 cm  AV Vmax:      128.36 cm/s  AV Vmean:     85.090 cm/s  AV VTI:      0.216 m  AV Peak Grad:   6.6 mmHg  AV Mean Grad:   3.3 mmHg  LVOT Vmax:     87.20 cm/s  LVOT Vmean:    55.400 cm/s  LVOT VTI:     0.155 m  LVOT/AV VTI ratio: 0.72    AORTA  Ao Root diam: 3.60 cm   MITRAL VALVE       TRICUSPID VALVE  MV Area (PHT): 2.80 cm  TR Peak grad:  37.9 mmHg  MV Peak grad: 29.8 mmHg TR Vmax:    308.00 cm/s  MV Mean grad: 10.0 mmHg  MV Vmax:    2.73 m/s  SHUNTS  MV Vmean:   138.0 cm/s Systemic VTI: 0.16 m  MV Decel Time: 271 msec   Systemic Diam: 2.10 cm   Donald RichJonathan Branch MD  Electronically signed by Donald RichJonathan Branch MD  Signature Date/Time: 04/19/2020/4:53:21 PM      TRANSESOPHOGEAL ECHO REPORT       Donald [name] Name:  Donald BombardLAMARCUS R Stadler Date of Exam: 05/23/2020  Medical Rec #: [id number]      Height:    69.0 in  Accession #:  [id number]     Weight:    214.0 lb  Date of Birth: 10-08-1985     BSA:     2.126 m  Donald [name] Age:  34 years      BP:      119/92 mmHg  Donald [name] Gender: M          HR:      76 bpm.  [facility]   Procedure: Transesophageal Echo   Indications:  Mitral Regurgitation    History:    Donald [name] has prior history of Echocardiogram examinations,  most         recent 04/19/2020. Previous Myocardial Infarction; Risk         Factors:Former Smoker. Mitral Regurgitation, Autoimmune         disorderm , Seronegative spondylitis , s/p Mitral valve  repair         with resection of ruptured anterior papillary muscle,         reconstruction of papillary chord with two Neo-chords and         placement of Simplici-T Annuloplasty ring.    Sonographer:  Jeryl ColumbiaJohanna Elliott RDCS (AE)  Referring Phys: [id number] Dorothe PeaJONATHAN F BRANCH   PROCEDURE: After discussion of the risks and benefits of a TEE, an  informed consent was obtained from the Donald [name]. The transesophogeal probe  was passed without difficulty through the esophogus of the Donald [name].  Sedation performed by performing physician.  The Donald [name] was monitored while under deep sedation. Anesthestetic  sedation was provided intravenously by Anesthesiology: 20mg  of Propofol.  The Donald [name] developed Donald complications during the procedure. Technically  difficult study, standard views were off  axis particularly of the mitral valve.   IMPRESSIONS    1. Left ventricular ejection fraction, by estimation, is 40 to 45%. The  left  ventricle has mildly decreased function.  2. Right ventricular systolic function moderately to severely reduced.  The right ventricular size is moderately enlarged.  3. Left atrial size was severely dilated. Donald left atrial/left atrial  appendage thrombus was detected. The LAA emptying velocity was 60 cm/s.  4. Right atrial size was severely dilated.  5. Difficult visualization of mitral valve and  regurgitant jet. Findings  consistent with prior repair. Thickened leaflets with restricted motion.  The MR jet was not amenable to PISA. Moderate mean gradient across the  valve of 6 mmHg. In images #[id number]  there appears to be regurgitation jet through the valve but also lateral  to the anular ring. The cumulative regurgitation appears to be severe. .  The mitral valve has been repaired/replaced. Severe mitral valve  regurgitation. Moderate mitral stenosis.  6. The tricuspid valve is abnormal. Tricuspid valve regurgitation is  moderate to severe.  7. The aortic valve is tricuspid. Aortic valve regurgitation is not  visualized. Donald aortic stenosis is present.  8. Technically difficult study, standard views were off axis particularly  of the mitral valve.   FINDINGS  Left Ventricle: Left ventricular ejection fraction, by estimation, is 40  to 45%. The left ventricle has mildly decreased function. The left  ventricular internal cavity size was normal in size.   Right Ventricle: The right ventricular size is moderately enlarged. Donald  increase in right ventricular wall thickness. Right ventricular systolic  function moderately to severely reduced.   Left Atrium: Left atrial size was severely dilated. Donald left atrial/left  atrial appendage thrombus was detected. The LAA emptying velocity was 60  cm/s.   Right Atrium: Right atrial size was severely dilated.   Pericardium: There is Donald evidence of pericardial effusion.   Mitral Valve: Difficult visualization of mitral valve and regurgitant  jet.  Findings consistent with prior repair. Thickened leaflets with restricted  motion. The MR jet was not amenable to PISA. Moderate mean gradient across  the valve of 6 mmHg. In images  #[id number] there appears to be regurgitation jet through the valve but also  lateral to the anular ring. The cumulative regurgitation appears to be  severe. The mitral valve has been repaired/replaced. Severe mitral valve  regurgitation. Moderate mitral valve  stenosis. The mean mitral valve gradient is 6.1 mmHg with average heart  rate of 91 bpm.   Tricuspid Valve: The tricuspid valve is abnormal. Tricuspid valve  regurgitation is moderate to severe. Donald evidence of tricuspid stenosis.   Aortic Valve: The aortic valve is tricuspid. Aortic valve regurgitation is  not visualized. Donald aortic stenosis is present.   Pulmonic Valve: The pulmonic valve was not well visualized. Pulmonic valve  regurgitation is not visualized. Donald evidence of pulmonic stenosis.   Aorta: The aortic root is normal in size and structure.   IAS/Shunts: Donald atrial level shunt detected by color flow Doppler.     MITRAL VALVE  MV Mean grad: 6.1 mmHg   Donald Rich MD  Electronically signed by Donald Rich MD  Signature Date/Time: 05/24/2020/11:19:38 AM        Impression:  Donald [name] has stage D recurrent severe symptomatic primary mitral regurgitation complicated by chronic combined systolic and diastolic congestive heart failure having previously undergone an attempt at mitral valve repair December 2019.  Over the past 6 months Donald [name] has had multiple recurrent episodes of acute exacerbation of class IV heart failure with fluid overload, and he recently was seen in the emergency department for an episode of paroxysmal atrial flutter.  Over the past few weeks the Donald [name] has been clinically stable with class II symptoms of exertional shortness of breath on current medical therapy.  I have personally reviewed the Donald [name]'s  most recent transthoracic and transesophageal echocardiograms.  There is severe mitral regurgitation.  Previous ring annuloplasty appears intact although there may be one area of paravalvular leak.  There  is moderately thickened anterior leaflet of the mitral valve with mild to moderately restricted leaflet mobility.  There is at least moderate if not severe global left ventricular systolic dysfunction with ejection fraction estimated 40 to 45% in the setting of severe mitral regurgitation.  There is also significant septal flattening with evidence of right ventricular pressure and volume overload.  There is significant right ventricular dysfunction.  There is moderate to severe secondary tricuspid regurgitation.  The Donald [name]'s long-term prognosis without surgical intervention is probably not good.  I feel the Donald [name] needs reoperation for either attempted redo mitral valve repair or valve replacement.  Based upon review of the transesophageal echocardiogram I am not optimistic that the Donald [name]'s valve can be repaired a second time and he will likely need valve replacement.  In addition, he will likely need concomitant tricuspid valve repair.  Whether or not concomitant Maze procedure should be considered is debatable.  Given the severity of biventricular dysfunction and right ventricular pressure and volume overload, operative risks may be very high.    Plan:  The Donald [name] and his father were counseled at length regarding diagnosis of severe recurrent primary mitral regurgitation.  We reviewed the results of his diagnostic tests including images from the most recent echocardiogram.  We discussed the natural history of mitral regurgitation as well as alternative treatment strategies.  We discussed the impact of age, previous surgery, current state of health, and any significant comorbid medical problems on clinical decision making.  We went on to discuss the indications, risks and potential benefits of redo  mitral valve repair or replacement, as well as the timing of surgical intervention.  The rationale for elective surgery has been explained, including a comparison between surgery and continued medical therapy with close follow-up.  The likelihood of successful and durable mitral valve repair has been discussed with particular reference to the findings of the most recent echocardiogram.  Based upon these findings and previous experience, I have quoted a less than 50 percent likelihood of successful valve repair.  In the event that the Donald [name]'s valve cannot be successfully repaired, we discussed the possibility of replacing the mitral valve using a mechanical prosthesis with the attendant need for long-term anticoagulation using warfarin versus the alternative of replacing it using a bioprosthetic tissue valve with its potential for late structural valve deterioration and failure, depending upon the Donald [name]'s longevity.  The need for concomitant tricuspid valve repair was discussed.  The impact of the recent diagnosis of paroxysmal atrial fibrillation and atrial flutter was discussed.  Concerns about the Donald [name]'s left and right ventricular function were discussed.  All questions were answered.  As the next step the Donald [name] will be referred to the advanced heart failure clinic.  He will need right heart catheterization to be performed in consideration as to whether or not changes should be made to his medical therapy prior to high risk surgical intervention.  Coronary angiography could be considered or alternatively the Donald [name] could undergo gated coronary CT angiography.  The Donald [name] will also be referred to the dental clinic for routine dental exam and cleaning.  Finally, I have strongly encouraged the Donald [name] to get vaccinated against the COVID-19 vaccine.  The Donald [name] will return to our office for follow-up in approximately 3 weeks.    I spent in excess of 90 minutes during the conduct of this office  consultation and >50% of this time involved direct face-to-face encounter with the Donald [name] for counseling and/or coordination of their care.    Marilu Favre  Zadie Cleverly, MD 08/03/2020 9:30 AM

## 2020-08-03 NOTE — Patient Instructions (Addendum)
Continue all previous medications without any changes at this time  Schedule appointment with Dr Gala Romney in the Advanced Heart Failure clinic as soon as practical.  He will arrange to perform heart catheterization.  Schedule appointment in the Dental Service clinic as soon as practical  Consider getting vaccinated for COVID19 as soon as possible  Check your weight on a regular basis and keep a log for your records.  Look for signs of fluid overload such as worsening swelling of your lower legs, increased shortness of breath with activity, and/or a dry nonproductive cough.  Discussed these findings with your cardiologist including whether or not you should adjust your fluid pill dosage (diuretic).  Endocarditis is a potentially serious infection of heart valves or inside lining of the heart.  It occurs more commonly in patients with diseased heart valves (such as patient's with aortic or mitral valve disease) and in patients who have undergone heart valve repair or replacement.  Certain surgical and dental procedures may put you at risk, such as dental cleaning, other dental procedures, or any surgery involving the respiratory, urinary, gastrointestinal tract, gallbladder or prostate gland.   To minimize your chances for develooping endocarditis, maintain good oral health and seek prompt medical attention for any infections involving the mouth, teeth, gums, skin or urinary tract.    Always notify your doctor or dentist about your underlying heart valve condition before having any invasive procedures. You will need to take antibiotics before certain procedures, including all routine dental cleanings or other dental procedures.  Your cardiologist or dentist should prescribe these antibiotics for you to be taken ahead of time.

## 2020-08-06 NOTE — Progress Notes (Deleted)
Cardiology Office Note:    Date:  08/06/2020   ID:  Donald [name], DOB 1985-12-01, MRN [id number]  PCP:  Patient, No Pcp Per  Cardiologist:  Dina Rich, MD   Referring MD: No ref. provider found   No chief complaint on file. ***  History of Present Illness:    Donald [name] is a 35 y.o. male with a hx of chronic systolic heart failure, MR s/p mitral valve repair 2019, chronic systolic and diastolic heart failure, and history of DVT.   He was admitted Dec 2019 with acute respiratory failure requiring intubation and apparently suffered a cardiac arrest upon induction of anesthesia. TTE showed severe MR. A follow up TEE showed severe prolapse and possible flail A3 scallop with severe MR. He subsequently underwent MV repair via septal approach 06/07/18. He unfortunately had a repeat hospitalization in Feb 2020 with repeat TEE that showed mobile echodensity on the posterior leaflet that was possible suture vs torn chordae vs vegetation. CT surgery consulted and recommended medical management, although re-intervention could be considered. Autoimmune disorder was considered as a possible etiology for myxomatous degeneration. He was referred to rheumatology for workup.   He was last seen by Randall An PA-C 06/22/20. He has battled worsening dyspnea and abdominal distention with severe ER visits. Lasix was increased and TEE repeated on 05/23/20 that showed EF 40-45% with severe biatrial dilation, moderate to sever RV dysfunction, severe MR, moderate MS, and moderate to severe TR. He was referred to CT surgery. Prior to seeing CT surgery, he was hospitalized with new onset atrial flutter 07/17/20 at [facility] in the setting of COVID-19 infection. He converted with cardizem and IV BB and was discharged on 5 mg eliquis BID.    He saw CT surgery on 08/03/20 who discussed mitral vavle repair vs mechanical valve replacement +/- tricuspid valve repair. He was referred to advanced heart failure  clinic and will need right heart catheterization. We are also asked to consider left heart catheterization vs coronary CT.  He presents today for follow up of recent diagnosis of Afib.      New onset atrial fibrillation Chronic anticoagulation - in the setting of COVID-19 infection - EKG today - he is unaware of his rhythm - tolerating xarelto well This patients CHA2DS2-VASc Score and unadjusted Ischemic Stroke Rate (% per year) is equal to 2.2 % stroke rate/year from a score of 2 (CHF, HTN)    Severe MR S/P mitral valve repair via septal approach - pt has had a recurrence of severe MR and is following with CT surgery for next steps (above) - he has been referred to AHF clinic and right heart cath +/- left heart cath vs CT coronary   Chronic systolic heart failure Moderate to severe RV dysfunction - EF 40-45% - biatrial enlargement - maintained on BB, lisinopril, and lasix + K     Past Medical History:  Diagnosis Date  . Anemia   . Autoimmune disorder (HCC)    pyoderma gangrenosum  . Chronic systolic heart failure (HCC)    a. EF 35-40% by echo in 07/2018 b. EF at 45% by repeat echo in 04/2020  . DVT (deep venous thrombosis) (HCC)    h/o  . Mitral regurgitation    a. s/p MV repair with resection of ruptured anterior papillary muscle and reconstruction of papillary chord and placement of annuloplasty ringin 2019  . Myocardial infarction (HCC)   . Seronegative spondylitis (HCC)    arthritis  . Tricuspid regurgitation  Past Surgical History:  Procedure Laterality Date  . HERNIA REPAIR Right 2018  . MITRAL VALVE REPAIR  06/07/2018   [facility] - Dr Meda Klinefelter  . TEE WITHOUT CARDIOVERSION N/A 05/23/2020   Procedure: TRANSESOPHAGEAL ECHOCARDIOGRAM (TEE) WITH PROPOFOL;  Surgeon: [name], MD;  Location: [facility] ENDO SU[facility];  Service: Endoscopy;  Laterality: N/A;    Current Medications: No outpatient medications have been marked as taking for the  08/07/20 encounter (Appointment) with [name], PA.   Current Facility-Administered Medications for the 08/07/20 encounter (Appointment) with [name], PA  Medication  . sodium chloride flush (NS) 0.9 % injection 3 mL     Allergies:   Patient has no known allergies.   Social History   Socioeconomic History  . Marital status: Married    Spouse name: Not on file  . Number of children: Not on file  . Years of education: Not on file  . Highest education level: Not on file  Occupational History  . Not on file  Tobacco Use  . Smoking status: Former Games developer  . Smokeless tobacco: Never Used  Vaping Use  . Vaping Use: Not on file  Substance and Sexual Activity  . Alcohol use: No  . Drug use: No  . Sexual activity: Not on file  Other Topics Concern  . Not on file  Social History Narrative  . Not on file   Social Determinants of Health   Financial Resource Strain: Not on file  Food Insecurity: Not on file  Transportation Needs: Not on file  Physical Activity: Not on file  Stress: Not on file  Social Connections: Not on file     Family History: The patient's family history includes Depression in his father; Diabetes in his father and paternal grandmother; Multiple sclerosis in his mother; Psoriasis in his mother.  ROS:   Please see the history of present illness.     All other systems reviewed and are negative.  EKGs/Labs/Other Studies Reviewed:    The following studies were reviewed today: ***  EKG:  EKG is *** ordered today.  The ekg ordered today demonstrates ***  Recent Labs: No results found for requested labs within last 8760 hours.  Recent Lipid Panel No results found for: CHOL, TRIG, HDL, CHOLHDL, VLDL, LDLCALC, LDLDIRECT  Physical Exam:    VS:  There were no vitals taken for this visit.    Wt Readings from Last 3 Encounters:  08/03/20 207 lb (93.9 kg)  06/22/20 213 lb (96.6 kg)  05/21/20 214 lb (97.1 kg)     GEN: *** Well  nourished, well developed in no acute distress HEENT: Normal NECK: No JVD; No carotid bruits LYMPHATICS: No lymphadenopathy CARDIAC: ***RRR, no murmurs, rubs, gallops RESPIRATORY:  Clear to auscultation without rales, wheezing or rhonchi  ABDOMEN: Soft, non-tender, non-distended MUSCULOSKELETAL:  No edema; No deformity  SKIN: Warm and dry NEUROLOGIC:  Alert and oriented x 3 PSYCHIATRIC:  Normal affect   ASSESSMENT:    No diagnosis found. PLAN:    In order of problems listed above:  No diagnosis found.   Medication Adjustments/Labs and Tests Ordered: Current medicines are reviewed at length with the patient today.  Concerns regarding medicines are outlined above.  No orders of the defined types were placed in this encounter.  No orders of the defined types were placed in this encounter.   Signed, [name], [place]  08/06/2020 8:47 PM    [facility]

## 2020-08-07 ENCOUNTER — Ambulatory Visit: Payer: Medicaid Other | Admitting: Physician Assistant

## 2020-08-23 ENCOUNTER — Other Ambulatory Visit: Payer: Self-pay

## 2020-08-23 ENCOUNTER — Emergency Department (HOSPITAL_COMMUNITY)
Admission: EM | Admit: 2020-08-23 | Discharge: 2020-08-23 | Disposition: A | Discharge status: Home / Self Care | Payer: Medicaid Other | Attending: Emergency Medicine | Admitting: Emergency Medicine

## 2020-08-23 ENCOUNTER — Encounter (HOSPITAL_COMMUNITY): Payer: Self-pay | Admitting: Emergency Medicine

## 2020-08-23 DIAGNOSIS — Z7901 Long term (current) use of anticoagulants: Secondary | ICD-10-CM

## 2020-08-23 DIAGNOSIS — D7589 Other specified diseases of blood and blood-forming organs: Secondary | ICD-10-CM | POA: Insufficient documentation

## 2020-08-23 DIAGNOSIS — Z86718 Personal history of other venous thrombosis and embolism: Secondary | ICD-10-CM | POA: Insufficient documentation

## 2020-08-23 DIAGNOSIS — Z7982 Long term (current) use of aspirin: Secondary | ICD-10-CM | POA: Insufficient documentation

## 2020-08-23 DIAGNOSIS — I5022 Chronic systolic (congestive) heart failure: Secondary | ICD-10-CM | POA: Insufficient documentation

## 2020-08-23 DIAGNOSIS — E876 Hypokalemia: Secondary | ICD-10-CM

## 2020-08-23 DIAGNOSIS — Z87891 Personal history of nicotine dependence: Secondary | ICD-10-CM | POA: Insufficient documentation

## 2020-08-23 DIAGNOSIS — T502X5A Adverse effect of carbonic-anhydrase inhibitors, benzothiadiazides and other diuretics, initial encounter: Secondary | ICD-10-CM | POA: Insufficient documentation

## 2020-08-23 DIAGNOSIS — I4892 Unspecified atrial flutter: Secondary | ICD-10-CM

## 2020-08-23 LAB — BASIC METABOLIC PANEL
Anion gap: 10 (ref 5–15)
BUN: 18 mg/dL (ref 6–20)
CO2: 26 mmol/L (ref 22–32)
Calcium: 9 mg/dL (ref 8.9–10.3)
Chloride: 101 mmol/L (ref 98–111)
Creatinine, Ser: 0.93 mg/dL (ref 0.61–1.24)
GFR, Estimated: >60 mL/min (ref >60)
Glucose, Bld: 117 mg/dL — ABNORMAL HIGH (ref 70–99)
Potassium: 3.2 mmol/L — ABNORMAL LOW (ref 3.5–5.1)
Sodium: 137 mmol/L (ref 135–145)

## 2020-08-23 LAB — CBC
HCT: 43.4 % (ref 39.0–52.0)
Hemoglobin: 14.1 g/dL (ref 13.0–17.0)
MCH: 27.3 pg (ref 26.0–34.0)
MCHC: 32.5 g/dL (ref 30.0–36.0)
MCV: 84.1 fL (ref 80.0–100.0)
Platelets: 205 10*3/uL (ref 150–400)
RBC: 5.16 MIL/uL (ref 4.22–5.81)
RDW: 19.9 % — ABNORMAL HIGH (ref 11.5–15.5)
WBC: 17.3 10*3/uL — ABNORMAL HIGH (ref 4.0–10.5)
nRBC: 0 % (ref 0.0–0.2)

## 2020-08-23 LAB — MAGNESIUM: Magnesium: 1.8 mg/dL (ref 1.7–2.4)

## 2020-08-23 MED ORDER — PROPOFOL 10 MG/ML IV BOLUS
INTRAVENOUS | Status: AC | PRN
Start: 2020-08-23 — End: 2020-08-23
  Administered 2020-08-23: 49.2 mg via INTRAVENOUS

## 2020-08-23 MED ORDER — PROPOFOL 10 MG/ML IV BOLUS
INTRAVENOUS | Status: AC
  Administered 2020-08-23: 49.2 mg via INTRAVENOUS
  Filled 2020-08-23: qty 20

## 2020-08-23 MED ORDER — POTASSIUM CHLORIDE CRYS ER 20 MEQ PO TBCR: 20.0000 meq | EXTENDED_RELEASE_TABLET | Freq: Two times a day (BID) | ORAL | 0 refills | Status: DC

## 2020-08-23 MED ORDER — POTASSIUM CHLORIDE CRYS ER 20 MEQ PO TBCR
40.0000 meq | EXTENDED_RELEASE_TABLET | Freq: Once | ORAL | Status: AC
  Administered 2020-08-23: 40 meq via ORAL
  Filled 2020-08-23: qty 2

## 2020-08-23 MED ORDER — PROPOFOL 10 MG/ML IV BOLUS: 0.5000 mg/kg | Freq: Once | INTRAVENOUS | Status: AC

## 2020-08-23 NOTE — ED Triage Notes (Signed)
Pt states that his heart started beating fast last night at 2200. Pt states that he had cabg in 2019, and has to have another cabg soon for mitral valve replacement.

## 2020-08-23 NOTE — ED Provider Notes (Signed)
[facility] EMERGENCY DEPARTMENT Provider Note   CSN: [id number] Arrival date & time: 08/23/20  0129   History Chief Complaint  Patient presents with  . Tachycardia    Donald [name] is a 35 y.o. male.  The history is provided by the patient.  He has history of mitral valve surgery, chronic systolic heart failure and comes in because of rapid heartbeat which started suddenly at about 10 PM.  He denies chest pain, heaviness, tightness, pressure.  He denies dyspnea, nausea, vomiting, diaphoresis.  He had been hospitalized for similar episode about 1 month ago.  He is anticoagulated on apixaban and states he has not missed any doses.  Past Medical History:  Diagnosis Date  . Anemia   . Autoimmune disorder (HCC)    pyoderma gangrenosum  . Chronic systolic heart failure (HCC)    a. EF 35-40% by echo in 07/2018 b. EF at 45% by repeat echo in 04/2020  . DVT (deep venous thrombosis) (HCC)    h/o  . Mitral regurgitation    a. s/p MV repair with resection of ruptured anterior papillary muscle and reconstruction of papillary chord and placement of annuloplasty ringin 2019  . Myocardial infarction (HCC)   . Seronegative spondylitis (HCC)    arthritis  . Tricuspid regurgitation     Patient Active Problem List   Diagnosis Date Noted  . Tricuspid regurgitation   . Mitral regurgitation   . Chronic systolic heart failure (HCC)   . Autoimmune disorder (HCC)   . Seronegative spondylitis (HCC)     Past Surgical History:  Procedure Laterality Date  . HERNIA REPAIR Right 2018  . MITRAL VALVE REPAIR  06/07/2018   [facility] - Dr Meda Klinefelter  . TEE WITHOUT CARDIOVERSION N/A 05/23/2020   Procedure: TRANSESOPHAGEAL ECHOCARDIOGRAM (TEE) WITH PROPOFOL;  Surgeon: [name], MD;  Location: [facility];  Service: Endoscopy;  Laterality: N/A;       Family History  Problem Relation Age of Onset  . Multiple sclerosis Mother   . Psoriasis Mother   . Depression  Father   . Diabetes Father   . Diabetes Paternal Grandmother     Social History   Tobacco Use  . Smoking status: Former Games developer  . Smokeless tobacco: Never Used  Substance Use Topics  . Alcohol use: No  . Drug use: No    Home Medications Prior to Admission medications   Medication Sig Start Date End Date Taking? Authorizing Provider  ASPIRIN ADULT LOW STRENGTH 81 MG EC tablet Take 81 mg by mouth daily.  06/13/18   [provider]  ferrous sulfate 325 (65 FE) MG tablet Take 1 tablet (325 mg total) by mouth daily. 12/23/18   [name], MD  furosemide (LASIX) 40 MG tablet Take 1 tablet (40 mg total) by mouth 2 (two) times daily. 06/22/20 09/20/20  Strader, Lennart Pall, PA-C  lisinopril (ZESTRIL) 10 MG tablet Take 1 tablet (10 mg total) by mouth daily. 04/11/20 07/10/20  Strader, Lennart Pall, PA-C  metoprolol succinate (TOPROL-XL) 25 MG 24 hr tablet Take 1 tablet (25 mg total) by mouth daily. 04/11/20   Strader, Lennart Pall, PA-C  potassium chloride SA (KLOR-CON) 20 MEQ tablet Take 1 tablet (20 mEq total) by mouth daily. 04/11/20   Ellsworth Lennox, PA-C    Allergies    Patient has no known allergies.  Review of Systems   Review of Systems  All other systems reviewed and are negative.   Physical Exam  Updated Vital Signs BP 122/72   Pulse (!) 153   Temp 98.4 F (36.9 C)   Resp (!) 22   Ht 5\' 9"  (1.753 m)   Wt 98.4 kg   SpO2 99%   BMI 32.05 kg/m   Physical Exam Vitals and nursing note reviewed.   35 year old male, resting comfortably and in no acute distress. Vital signs are significant for rapid heart rate and slightly elevated respiratory rate. Oxygen saturation is 99%, which is normal. Head is normocephalic and atraumatic. PERRLA, EOMI. Oropharynx is clear. Neck is nontender and supple without adenopathy or JVD. Back is nontender and there is no CVA tenderness. Lungs are clear without rales, wheezes, or rhonchi. Chest is nontender. Heart is tachycardic  without murmur. Abdomen is soft, flat, nontender without masses or hepatosplenomegaly and peristalsis is normoactive. Extremities have no cyanosis or edema, full range of motion is present. Skin is warm and dry without rash. Neurologic: Mental status is normal, cranial nerves are intact, there are no motor or sensory deficits.  ED Results / Procedures / Treatments   Labs (all labs ordered are listed, but only abnormal results are displayed) Labs Reviewed - No data to display  EKG EKG Interpretation  Date/Time:  Thursday August 23 2020 01:41:42 EST Ventricular Rate:  154 PR Interval:    QRS Duration: 102 QT Interval:  298 QTC Calculation: 477 R Axis:   78 Text Interpretation: Atrial flutter with 2 to 1 block Probable inferior infarct, old Repol abnrm suggests ischemia, diffuse leads - probbaly rate-related No old tracing to compare Confirmed by 07-03-1995 (Dione Booze) on 08/23/2020 1:54:50 AM  ED ECG REPORT   Date: 08/23/2020 at 02:11  Rate: 81  Rhythm: normal sinus rhythm and premature atrial contractions (PAC)  QRS Axis: normal  Intervals: normal  ST/T Wave abnormalities: normal  Conduction Disutrbances:none  Narrative Interpretation: Inferior Q waves, possible old inferior wall myocardial infarction.  When compared with ECG from earlier today, sinus rhythm with PVCs has replaced atrial flutter with 2-1 conduction.  Repolarization abnormality is no longer present, was probably rate related.  Old EKG Reviewed: changes noted  I have personally reviewed the EKG tracing and agree with the computerized printout as noted.  Procedures .Sedation  Date/Time: 08/23/2020 2:13 AM Performed by: 10/23/2020, MD Authorized by: Dione Booze, MD   Consent:    Consent obtained:  Verbal   Consent given by:  Patient   Risks discussed:  Allergic reaction, dysrhythmia, inadequate sedation, nausea, prolonged hypoxia resulting in organ damage, prolonged sedation necessitating reversal, respiratory  compromise necessitating ventilatory assistance and intubation and vomiting   Alternatives discussed:  Analgesia without sedation, anxiolysis and regional anesthesia Universal protocol:    Procedure explained and questions answered to patient or proxy's satisfaction: yes     Relevant documents present and verified: yes     Test results available: yes     Imaging studies available: yes     Required blood products, implants, devices, and special equipment available: yes     Site/side marked: yes     Immediately prior to procedure, a time out was called: yes     Patient identity confirmed:  Verbally with patient and arm band Indications:    Procedure performed:  Cardioversion   Procedure necessitating sedation performed by:  Physician performing sedation Pre-sedation assessment:    Time since last food or drink:  4 hours   ASA classification: class 3 - patient with severe systemic disease     Mouth  opening:  3 or more finger widths   Thyromental distance:  4 finger widths   Mallampati score:  I - soft palate, uvula, fauces, pillars visible   Neck mobility: normal     Pre-sedation assessments completed and reviewed: airway patency, cardiovascular function, hydration status, mental status, nausea/vomiting, pain level, respiratory function and temperature     Pre-sedation assessment completed:  08/23/2020 2:00 AM Immediate pre-procedure details:    Reassessment: Patient reassessed immediately prior to procedure     Reviewed: vital signs, relevant labs/tests and NPO status     Verified: bag valve mask available, emergency equipment available, intubation equipment available, IV patency confirmed, oxygen available and suction available   Procedure details (see MAR for exact dosages):    Preoxygenation:  Nasal cannula   Sedation:  Propofol   Intended level of sedation: deep   Intra-procedure monitoring:  Blood pressure monitoring, cardiac monitor, continuous pulse oximetry, frequent LOC  assessments, frequent vital sign checks and continuous capnometry   Intra-procedure events: none     Total Provider sedation time (minutes):  30 Post-procedure details:    Post-sedation assessment completed:  08/23/2020 2:30 AM   Attendance: Constant attendance by certified staff until patient recovered     Recovery: Patient returned to pre-procedure baseline     Post-sedation assessments completed and reviewed: airway patency, cardiovascular function, hydration status, mental status, nausea/vomiting, pain level, respiratory function and temperature     Patient is stable for discharge or admission: yes     Procedure completion:  Tolerated well, no immediate complications .Cardioversion  Date/Time: 08/23/2020 2:14 AM Performed by: Dione Booze, MD Authorized by: Dione Booze, MD   Consent:    Consent obtained:  Verbal   Consent given by:  Patient   Risks discussed:  Cutaneous burn, death and induced arrhythmia   Alternatives discussed:  Rate-control medication Pre-procedure details:    Cardioversion basis:  Emergent   Rhythm:  Atrial flutter   Electrode placement:  Anterior-posterior Attempt one:    Cardioversion mode:  Synchronous   Waveform:  Biphasic   Shock (Joules):  200   Shock outcome:  Conversion to normal sinus rhythm Post-procedure details:    Patient tolerance of procedure:  Tolerated well, no immediate complications     Medications Ordered in ED Medications  propofol (DIPRIVAN) 10 mg/mL bolus/IV push 49.2 mg (49.2 mg Intravenous Given by Other 08/23/20 0208)  potassium chloride SA (KLOR-CON) CR tablet 40 mEq (40 mEq Oral Given 08/23/20 0235)  propofol (DIPRIVAN) 10 mg/mL bolus/IV push (49.2 mg Intravenous Given 08/23/20 0208)    ED Course  I have reviewed the triage vital signs and the nursing notes.  Pertinent lab results that were available during my care of the patient were reviewed by me and considered in my medical decision making (see chart for  details).  MDM Rules/Calculators/A&P Tachycardia.  ECG shows atrial flutter with 2-1 conduction.  Old records are reviewed confirming hospitalization at [facility] from 07/17/2020 through 07/20/2020 with diagnosis of atrial fibrillation/atrial flutter.  Since he is appropriately anticoagulated, it is felt to be safe to proceed with synchronized cardioversion.   Patient was successfully cardioverted to sinus rhythm.  Frequent PVCs were noted following conversion.  Labs show hypokalemia, presumably related to diuretic use.  It is noted that he is already taking K-Dur daily.  This will need to be increased to twice a day.  He is referred to his cardiologist into the atrial fibrillation clinic for follow-up.  CHA2DS2/VAS Stroke Risk Points  3 >= 2 Points: High Risk  1 - 1.99 Points: Medium Risk  0 Points: Low Risk    Last Change: N/A      This score determines the patient's risk of having a stroke if the  patient has atrial fibrillation.      This patient's score is 3.  He gets 1 point each for history of heart failure, DVT, and valvular heart disease.    Final Clinical Impression(s) / ED Diagnoses Final diagnoses:  Paroxysmal atrial flutter (HCC)  Chronic anticoagulation  Diuretic-induced hypokalemia    Rx / DC Orders ED Discharge Orders         Ordered    Amb referral to AFIB Clinic        08/23/20 0153    potassium chloride SA (KLOR-CON) 20 MEQ tablet  2 times daily        08/23/20 Bufford Spikes, MD 08/23/20 [phone]

## 2020-08-24 NOTE — Progress Notes (Signed)
Thanks for update, from note was referred to afib clinic which I agree with, we will also work to get him back in with [place]. Bradly Bienenstock can we get this patient a PA appt or one with me 2 weeks   Dominga Ferry MD

## 2020-08-26 NOTE — Progress Notes (Signed)
ADVANCED HF CLINIC CONSULT NOTE  Referring Physician: Dr. Cornelius Moras Primary Care: Patient, No Pcp Per Primary Cardiologist: Dina Rich, MD   HPI:  Donald [name] is a 35 y.o. 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 Cornelius Moras for further evaluation of his HF and MR.   Reports being healthy until late in 2019. In 12/19 presented to [facility] in respiratory failure. Intubated. Suffered cardiac arrest on induction of anesthesia. Subsequent echo revealed acute severe mitral regurgitation with anterior leaflet prolapse. He was taken for urgent mitral valve repair by Dr. Emelda Fear on June 07, 2018 where there was reported evidence of ruptured anterior papillary muscle.  The patient has been followed by Dr. Wyline Mood. Patient has had multiple episodes of acute exacerbation of fluid overload with at least one ER visit for acute pulmonary edema although he did not require hospitalization. Echo 11/21 EF 45% with  severe MR. TEE. 12/21 EF 40 to 45% with severe MR with multiple jets including a possible paravalvular leak, severe TR. The RV was moderate to severely HK with evidence of significant PH He has had several episodes of AFL with RVR. Last episode on 08/24/19 and cardioverted in ER.   He saw Dr. Cornelius Moras in 2/22 for consideration of repeat MV surgery. He was felt to need R/L cath prior to possible re-do surgery.  Here with his father. Says he is feeling pretty good as long as he is not in AF. No longer working.  Not going to the gym. Able to do ADLs without too much difficulty. No edema, orthopnea or PND.   Denies drug use in front of his father.   Review of Systems: [y] = yes, [ ]  = no   General: Weight gain [ ] ; Weight loss [ ] ; Anorexia [ ] ; Fatigue [ ] ; Fever [ ] ; Chills [ ] ; Weakness [ ]   Cardiac: Chest pain/pressure [ ] ; Resting SOB [ ] ; Exertional SOB ]; Orthopnea [ ] ; Pedal Edema [ ] ; Palpitations ]; Syncope [ ] ; Presyncope [ ] ; Paroxysmal nocturnal dyspnea[ ]   Pulmonary: Cough [ ] ; Wheezing[ ] ; Hemoptysis[ ] ; Sputum [ ] ; Snoring [ ]   GI: Vomiting[ ] ; Dysphagia[ ] ; Melena[ ] ; Hematochezia [ ] ; Heartburn[ ] ; Abdominal pain [ ] ; Constipation [ ] ; Diarrhea ]; BRBPR [ ]   GU: Hematuria[ ] ; Dysuria [ ] ; Nocturia[ ]   Vascular: Pain in legs with walking [ ] ; Pain in feet with lying flat [ ] ; Non-healing sores [ ] ; Stroke [ ] ; TIA [ ] ; Slurred speech [ ] ;  Neuro: Headaches[ ] ; Vertigo[ ] ; Seizures[ ] ; Paresthesias[ ] ;Blurred vision [ ] ; Diplopia [ ] ; Vision changes [ ]   Ortho/Skin: Arthritis [ ] ; Joint pain [ ] ; Muscle pain [ ] ; Joint swelling [ ] ; Back Pain [ ] ; Rash [ ]   Psych: Depression[ ] ; Anxiety[ ]   Heme: Bleeding problems [ ] ; Clotting disorders [ ] ; Anemia [ y]  Endocrine: Diabetes [ ] ; Thyroid dysfunction[ ]    Past Medical History:  Diagnosis Date  . Anemia   . Autoimmune disorder (HCC)    pyoderma gangrenosum  . Chronic systolic heart  failure (HCC)    a. EF 35-40% by echo in 07/2018 b. EF at 45% by repeat echo in 04/2020  . DVT (deep venous thrombosis) (HCC)    h/o  . Mitral regurgitation    a. s/p MV repair with resection of ruptured anterior papillary muscle and reconstruction of papillary chord and placement of annuloplasty ringin 2019  . Myocardial infarction (HCC)   . Seronegative spondylitis (HCC)    arthritis  . Tricuspid regurgitation     Current Outpatient Medications  Medication Sig Dispense Refill  . ASPIRIN ADULT LOW STRENGTH 81 MG EC tablet Take 81 mg by  mouth daily.     . ferrous sulfate 325 (65 FE) MG tablet Take 1 tablet (325 mg total) by mouth daily. 90 tablet 1  . furosemide (LASIX) 40 MG tablet Take 1 tablet (40 mg total) by mouth 2 (two) times daily. 180 tablet 3  . lisinopril (ZESTRIL) 10 MG tablet Take 1 tablet (10 mg total) by mouth daily. 90 tablet 2  . metoprolol succinate (TOPROL-XL) 25 MG 24 hr tablet Take 1 tablet (25 mg total) by mouth daily. 90 tablet 2  . potassium chloride SA (KLOR-CON) 20 MEQ tablet Take 1 tablet (20 mEq total) by mouth 2 (two) times daily. 60 tablet 0   Current Facility-Administered Medications  Medication Dose Route Frequency Provider Last Rate Last Admin  . sodium chloride flush (NS) 0.9 % injection 3 mL  3 mL Intravenous Q12H Branch, Dorothe Pea, MD        No Known Allergies    Social History   Socioeconomic History  . Marital status: Married    Spouse name: Not on file  . Number of children: Not on file  . Years of education: Not on file  . Highest education level: Not on file  Occupational History  . Not on file  Tobacco Use  . Smoking status: Former Games developer  . Smokeless tobacco: Never Used  Vaping Use  . Vaping Use: Not on file  Substance and Sexual Activity  . Alcohol use: No  . Drug use: No  . Sexual activity: Not on file  Other Topics Concern  . Not on file  Social History Narrative  . Not on file   Social Determinants of Health   Financial Resource Strain: Not on file  Food Insecurity: Not on file  Transportation Needs: Not on file  Physical Activity: Not on file  Stress: Not on file  Social Connections: Not on file  Intimate Partner Violence: Not on file      Family History  Problem Relation Age of Onset  . Multiple sclerosis Mother   . Psoriasis Mother   . Depression Father   . Diabetes Father   . Diabetes Paternal Grandmother     Vitals:   08/27/20 1424  BP: 130/80  Pulse: 63  SpO2: 99%  Weight: 101.7 kg (224 lb 3.2 oz)    PHYSICAL EXAM: General:   Well appearing. No respiratory difficulty HEENT: normal Neck: supple. no JVD. Carotids 2+ bilat; no bruits. No lymphadenopathy or thryomegaly appreciated. Cor: PMI nondisplaced. Regular rate & rhythm. 3/6 MR Lungs: clear Abdomen: soft, nontender, nondistended. No hepatosplenomegaly. No bruits or masses. Good bowel sounds. Extremities: no cyanosis, clubbing, rash, edema possible track marks on R hand  Neuro: alert & oriented x 3, cranial nerves grossly intact. moves all 4 extremities w/o difficulty. Affect pleasant.  ECG: NSR 81 with PACs/PVCs Personally reviewed   ASSESSMENT & PLAN:  1. Chronic systolic  biventricular HF - likely primarily valvular in nature in setting of recurrent severe MR - recently exacerbated by recurrent AFL - TEE. 12/21 EF 40 to 45% with severe MR with multiple jets including a possible paravalvular leak. Miderate RV dysfunction severe TR.  - NYHA II-III - Volume status ok on lasix 40 bid - On lisinopril 10, Toprol 25  - Switch lisinopril to Entresto 24/26 bid with 36 hour washout  2. Severe MR, recurrent  - s/p MV repair with resection of ruptured anterior papillary muscle and reconstruction of papillary chord and placement of annuloplasty ringin 2019 at [facility] - TEE 12/21 as above - Agree with Dr. Cornelius Moras that he will need repeat surgery with MVR/maze to prevent worsening PAH and further deterioration of his heart function but now high risk with moderate to severe RV dysfunction and PH - Will need R/L cath and probable admission for per-op tune-up with milrinone and diuresis - Given possible track marks on right hand, I asked him about IVDA but he adamantly denied. He did want to reschedule cath due to an upcoming court date. 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 Remains in NSR otday - continue Eliquis - at very high risk of recurrence which is not tolerated well - start amio 200 bid. 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 Over half that time spent discussing above.   Arvilla Meres, MD  11:13 PM

## 2020-08-26 NOTE — H&P (View-Only) (Signed)
ADVANCED HF CLINIC CONSULT NOTE  Referring Physician: Dr. Cornelius Moras Primary Care: Patient, No Pcp Per Primary Cardiologist: Dina Rich, MD   HPI:  Donald [name] is a 35 y.o. 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 Cornelius Moras for further evaluation of his HF and MR.   Reports being healthy until late in 2019. In 12/19 presented to [facility] in respiratory failure. Intubated. Suffered cardiac arrest on induction of anesthesia. Subsequent echo revealed acute severe mitral regurgitation with anterior leaflet prolapse. He was taken for urgent mitral valve repair by Dr. Emelda Fear on June 07, 2018 where there was reported evidence of ruptured anterior papillary muscle.  The patient has been followed by Dr. Wyline Mood. Patient has had multiple episodes of acute exacerbation of fluid overload with at least one ER visit for acute pulmonary edema although he did not require hospitalization. Echo 11/21 EF 45% with  severe MR. TEE. 12/21 EF 40 to 45% with severe MR with multiple jets including a possible paravalvular leak, severe TR. The RV was moderate to severely HK with evidence of significant PH He has had several episodes of AFL with RVR. Last episode on 08/24/19 and cardioverted in ER.   He saw Dr. Cornelius Moras in 2/22 for consideration of repeat MV surgery. He was felt to need R/L cath prior to possible re-do surgery.  Here with his father. Says he is feeling pretty good as long as he is not in AF. No longer working.  Not going to the gym. Able to do ADLs without too much difficulty. No edema, orthopnea or PND.   Denies drug use in front of his father.   Review of Systems: [y] = yes, [ ]  = no   General: Weight gain [ ] ; Weight loss [ ] ; Anorexia [ ] ; Fatigue [ ] ; Fever [ ] ; Chills [ ] ; Weakness [ ]   Cardiac: Chest pain/pressure [ ] ; Resting SOB [ ] ; Exertional SOB ]; Orthopnea [ ] ; Pedal Edema [ ] ; Palpitations ]; Syncope [ ] ; Presyncope [ ] ; Paroxysmal nocturnal dyspnea[ ]   Pulmonary: Cough [ ] ; Wheezing[ ] ; Hemoptysis[ ] ; Sputum [ ] ; Snoring [ ]   GI: Vomiting[ ] ; Dysphagia[ ] ; Melena[ ] ; Hematochezia [ ] ; Heartburn[ ] ; Abdominal pain [ ] ; Constipation [ ] ; Diarrhea ]; BRBPR [ ]   GU: Hematuria[ ] ; Dysuria [ ] ; Nocturia[ ]   Vascular: Pain in legs with walking [ ] ; Pain in feet with lying flat [ ] ; Non-healing sores [ ] ; Stroke [ ] ; TIA [ ] ; Slurred speech [ ] ;  Neuro: Headaches[ ] ; Vertigo[ ] ; Seizures[ ] ; Paresthesias[ ] ;Blurred vision [ ] ; Diplopia [ ] ; Vision changes [ ]   Ortho/Skin: Arthritis [ ] ; Joint pain [ ] ; Muscle pain [ ] ; Joint swelling [ ] ; Back Pain [ ] ; Rash [ ]   Psych: Depression[ ] ; Anxiety[ ]   Heme: Bleeding problems [ ] ; Clotting disorders [ ] ; Anemia [ y]  Endocrine: Diabetes [ ] ; Thyroid dysfunction[ ]    Past Medical History:  Diagnosis Date  . Anemia   . Autoimmune disorder (HCC)    pyoderma gangrenosum  . Chronic systolic heart  failure (HCC)    a. EF 35-40% by echo in 07/2018 b. EF at 45% by repeat echo in 04/2020  . DVT (deep venous thrombosis) (HCC)    h/o  . Mitral regurgitation    a. s/p MV repair with resection of ruptured anterior papillary muscle and reconstruction of papillary chord and placement of annuloplasty ringin 2019  . Myocardial infarction (HCC)   . Seronegative spondylitis (HCC)    arthritis  . Tricuspid regurgitation     Current Outpatient Medications  Medication Sig Dispense Refill  . ASPIRIN ADULT LOW STRENGTH 81 MG EC tablet Take 81 mg by  mouth daily.     . ferrous sulfate 325 (65 FE) MG tablet Take 1 tablet (325 mg total) by mouth daily. 90 tablet 1  . furosemide (LASIX) 40 MG tablet Take 1 tablet (40 mg total) by mouth 2 (two) times daily. 180 tablet 3  . lisinopril (ZESTRIL) 10 MG tablet Take 1 tablet (10 mg total) by mouth daily. 90 tablet 2  . metoprolol succinate (TOPROL-XL) 25 MG 24 hr tablet Take 1 tablet (25 mg total) by mouth daily. 90 tablet 2  . potassium chloride SA (KLOR-CON) 20 MEQ tablet Take 1 tablet (20 mEq total) by mouth 2 (two) times daily. 60 tablet 0   Current Facility-Administered Medications  Medication Dose Route Frequency Provider Last Rate Last Admin  . sodium chloride flush (NS) 0.9 % injection 3 mL  3 mL Intravenous Q12H Branch, Dorothe Pea, MD        No Known Allergies    Social History   Socioeconomic History  . Marital status: Married    Spouse name: Not on file  . Number of children: Not on file  . Years of education: Not on file  . Highest education level: Not on file  Occupational History  . Not on file  Tobacco Use  . Smoking status: Former Games developer  . Smokeless tobacco: Never Used  Vaping Use  . Vaping Use: Not on file  Substance and Sexual Activity  . Alcohol use: No  . Drug use: No  . Sexual activity: Not on file  Other Topics Concern  . Not on file  Social History Narrative  . Not on file   Social Determinants of Health   Financial Resource Strain: Not on file  Food Insecurity: Not on file  Transportation Needs: Not on file  Physical Activity: Not on file  Stress: Not on file  Social Connections: Not on file  Intimate Partner Violence: Not on file      Family History  Problem Relation Age of Onset  . Multiple sclerosis Mother   . Psoriasis Mother   . Depression Father   . Diabetes Father   . Diabetes Paternal Grandmother     Vitals:   08/27/20 1424  BP: 130/80  Pulse: 63  SpO2: 99%  Weight: 101.7 kg (224 lb 3.2 oz)    PHYSICAL EXAM: General:   Well appearing. No respiratory difficulty HEENT: normal Neck: supple. no JVD. Carotids 2+ bilat; no bruits. No lymphadenopathy or thryomegaly appreciated. Cor: PMI nondisplaced. Regular rate & rhythm. 3/6 MR Lungs: clear Abdomen: soft, nontender, nondistended. No hepatosplenomegaly. No bruits or masses. Good bowel sounds. Extremities: no cyanosis, clubbing, rash, edema possible track marks on R hand  Neuro: alert & oriented x 3, cranial nerves grossly intact. moves all 4 extremities w/o difficulty. Affect pleasant.  ECG: NSR 81 with PACs/PVCs Personally reviewed   ASSESSMENT & PLAN:  1. Chronic systolic  biventricular HF - likely primarily valvular in nature in setting of recurrent severe MR - recently exacerbated by recurrent AFL - TEE. 12/21 EF 40 to 45% with severe MR with multiple jets including a possible paravalvular leak. Miderate RV dysfunction severe TR.  - NYHA II-III - Volume status ok on lasix 40 bid - On lisinopril 10, Toprol 25  - Switch lisinopril to Entresto 24/26 bid with 36 hour washout  2. Severe MR, recurrent  - s/p MV repair with resection of ruptured anterior papillary muscle and reconstruction of papillary chord and placement of annuloplasty ringin 2019 at [facility] - TEE 12/21 as above - Agree with Dr. Owen that he will need repeat surgery with MVR/maze to prevent worsening PAH and further deterioration of his heart function but now high risk with moderate to severe RV dysfunction and PH - Will need R/L cath and probable admission for per-op tune-up with milrinone and diuresis - Given possible track marks on right hand, I asked him about IVDA but he adamantly denied. He did want to reschedule cath due to an upcoming court date. 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 Remains in NSR otday - continue Eliquis - at very high risk of recurrence which is not tolerated well - start amio 200 bid. 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 Over half that time spent discussing above.   Kaytlynn Kochan, MD  11:13 PM    

## 2020-08-27 ENCOUNTER — Encounter: Payer: Medicaid Other | Admitting: Thoracic Surgery (Cardiothoracic Vascular Surgery)

## 2020-08-27 ENCOUNTER — Telehealth (HOSPITAL_COMMUNITY): Payer: Self-pay | Admitting: Pharmacy Technician

## 2020-08-27 ENCOUNTER — Ambulatory Visit (HOSPITAL_COMMUNITY)
Admission: RE | Admit: 2020-08-27 | Discharge: 2020-08-27 | Disposition: A | Discharge status: Home / Self Care | Payer: Medicaid Other | Source: Ambulatory Visit | Attending: Internal Medicine | Admitting: Internal Medicine

## 2020-08-27 ENCOUNTER — Other Ambulatory Visit (HOSPITAL_COMMUNITY): Payer: Self-pay | Admitting: *Deleted

## 2020-08-27 ENCOUNTER — Encounter (HOSPITAL_COMMUNITY): Payer: Self-pay | Admitting: Internal Medicine

## 2020-08-27 ENCOUNTER — Other Ambulatory Visit: Payer: Self-pay

## 2020-08-27 VITALS — BP 130/80 | HR 63 | Wt 224.2 lb

## 2020-08-27 DIAGNOSIS — I340 Nonrheumatic mitral (valve) insufficiency: Secondary | ICD-10-CM

## 2020-08-27 DIAGNOSIS — Z86718 Personal history of other venous thrombosis and embolism: Secondary | ICD-10-CM | POA: Insufficient documentation

## 2020-08-27 DIAGNOSIS — I5022 Chronic systolic (congestive) heart failure: Secondary | ICD-10-CM

## 2020-08-27 DIAGNOSIS — Z8674 Personal history of sudden cardiac arrest: Secondary | ICD-10-CM | POA: Insufficient documentation

## 2020-08-27 DIAGNOSIS — Z87891 Personal history of nicotine dependence: Secondary | ICD-10-CM | POA: Insufficient documentation

## 2020-08-27 DIAGNOSIS — Z9889 Other specified postprocedural states: Secondary | ICD-10-CM

## 2020-08-27 DIAGNOSIS — I272 Pulmonary hypertension, unspecified: Secondary | ICD-10-CM

## 2020-08-27 DIAGNOSIS — I5082 Biventricular heart failure: Secondary | ICD-10-CM | POA: Insufficient documentation

## 2020-08-27 DIAGNOSIS — Z7901 Long term (current) use of anticoagulants: Secondary | ICD-10-CM | POA: Insufficient documentation

## 2020-08-27 DIAGNOSIS — Z79899 Other long term (current) drug therapy: Secondary | ICD-10-CM | POA: Insufficient documentation

## 2020-08-27 DIAGNOSIS — I480 Paroxysmal atrial fibrillation: Secondary | ICD-10-CM

## 2020-08-27 DIAGNOSIS — Z7982 Long term (current) use of aspirin: Secondary | ICD-10-CM | POA: Insufficient documentation

## 2020-08-27 LAB — CBC
HCT: 42.2 % (ref 39.0–52.0)
Hemoglobin: 13.6 g/dL (ref 13.0–17.0)
MCH: 27.1 pg (ref 26.0–34.0)
MCHC: 32.2 g/dL (ref 30.0–36.0)
MCV: 84.2 fL (ref 80.0–100.0)
Platelets: 161 10*3/uL (ref 150–400)
RBC: 5.01 MIL/uL (ref 4.22–5.81)
RDW: 19.7 % — ABNORMAL HIGH (ref 11.5–15.5)
WBC: 12.4 10*3/uL — ABNORMAL HIGH (ref 4.0–10.5)
nRBC: 0 % (ref 0.0–0.2)

## 2020-08-27 LAB — BASIC METABOLIC PANEL
Anion gap: 7 (ref 5–15)
BUN: 13 mg/dL (ref 6–20)
CO2: 27 mmol/L (ref 22–32)
Calcium: 9.1 mg/dL (ref 8.9–10.3)
Chloride: 102 mmol/L (ref 98–111)
Creatinine, Ser: 0.82 mg/dL (ref 0.61–1.24)
GFR, Estimated: >60 mL/min (ref >60)
Glucose, Bld: 79 mg/dL (ref 70–99)
Potassium: 3.9 mmol/L (ref 3.5–5.1)
Sodium: 136 mmol/L (ref 135–145)

## 2020-08-27 LAB — BRAIN NATRIURETIC PEPTIDE: B Natriuretic Peptide: 978.5 pg/mL — ABNORMAL HIGH (ref 0.0–100.0)

## 2020-08-27 MED ORDER — ENTRESTO 24-26 MG PO TABS: 1.0000 | ORAL_TABLET | Freq: Two times a day (BID) | ORAL | 3 refills | Status: DC

## 2020-08-27 NOTE — Patient Instructions (Addendum)
Start Amiodarone 200 mg Twice daily   STOP Lisinopril  START Entresto 24/26 mg Twice daily STARTING WED 3/16 AM  Labs done today, we will call you for abnormal results  Heart Catheterization on Tuesday 3/29, see instructions below  Your physician recommends that you schedule a follow-up appointment in: 3 months  If you have any questions or concerns before your next appointment please send [place] a message through [place] or call our office at [phone].    TO LEAVE A MESSAGE FOR THE NURSE SELECT OPTION 2, PLEASE LEAVE A MESSAGE INCLUDING: . YOUR NAME . DATE OF BIRTH . CALL BACK NUMBER . REASON FOR CALL**this is important as we prioritize the call backs  YOU WILL RECEIVE A CALL BACK THE SAME DAY AS LONG AS YOU CALL BEFORE 4:00 PM  At the [facility], you and your health needs are our priority. As part of our continuing mission to provide you with exceptional heart care, we have created designated Provider Care Teams. These Care Teams include your primary Cardiologist (physician) and Advanced Practice Providers (APPs- Physician Assistants and Nurse Practitioners) who all work together to provide you with the care you need, when you need it.   You may see any of the following providers on your designated Care Team at your next follow up: Marland Kitchen Dr Arvilla Meres . Dr Marca Ancona . Dr Thornell Mule . Tonye Becket, NP . Robbie Lis, PA . Shanda Bumps [place],NP . Karle Plumber, PharmD   Please be sure to bring in all your medications bottles to every appointment.   Do the following things EVERYDAY: 1) Weigh yourself in the morning before breakfast. Write it down and keep it in a log. 2) Take your medicines as prescribed 3) Eat low salt foods--Limit salt (sodium) to 2000 mg per day.  4) Stay as active as you can everyday 5) Limit all fluids for the day to less than 2 liters    Heart Catheterization Instructions:  You are scheduled for a Cardiac Catheterization  on Tuesday, March 29 with Dr. Arvilla Meres.  1. Please arrive at the [facility] (Main Entrance A) at [facility]: [address] [place], [place] [address] at 10:00 AM (This time is two hours before your procedure to ensure your preparation). Free valet parking service is available.   Special note: Every effort is made to have your procedure done on time. Please understand that emergencies sometimes delay scheduled procedures.  2. Diet: Do not eat solid foods after midnight.  The patient may have clear liquids until 5am upon the day of the procedure.  3. COVID TEST: Monday 3/28 at 8:45, this is a drive thru testing site located at:   [address], [place]  4. Medication instructions in preparation for your procedure:    Monday 3/28 DO NOT TAKE ANY ELIQUIS  Tuesday 3/29 AM DO NOT TAKE ELIQUIS OR FUROSEMIDE  On the morning of your procedure, take your Aspirin and any morning medicines NOT listed above.  You may use sips of water.  5. Plan for one night stay--bring personal belongings. 6. Bring a current list of your medications and current insurance cards. 7. You MUST have a responsible person to drive you home. 8. Someone MUST be with you the first 24 hours after you arrive home or your discharge will be delayed. 9. Please wear clothes that are easy to get on and off and wear slip-on shoes.  Thank you for allowing [place] to care  for you!   --  Invasive Cardiovascular services

## 2020-08-27 NOTE — Telephone Encounter (Signed)
Patient was seen in clinic and started on Entresto. Patient is currently uninsured, started an application for [employer] assistance.   Will fax in once all signatures are obtained.

## 2020-09-04 NOTE — Telephone Encounter (Signed)
Sent in application via fax.  Will follow up.  

## 2020-09-07 ENCOUNTER — Encounter: Payer: Self-pay | Admitting: Physician Assistant

## 2020-09-07 ENCOUNTER — Ambulatory Visit: Payer: Self-pay | Admitting: Physician Assistant

## 2020-09-07 NOTE — Progress Notes (Deleted)
Cardiology Office Note    Date:  09/07/2020   ID:  [name], Donald 05/30/1986, MRN [id number]  PCP:  Patient, No Pcp Per  Cardiologist:  Dina Rich, MD / CHF - Dr. Gala Romney Electrophysiologist:  None   Chief Complaint: f/u CHF, atrial flutter  History of Present Illness:   Donald [name] is a 35 y.o. 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  He was relatively healthy until late 2019. In 05/2018 he had presented to [facility] in respiratory failure requiring intubation and suffered cardiac arrest on induction of anesthesia. Subsequent echo revealed acute severe mitral regurgitation with anterior leaflet prolapse. He was taken for urgent mitral valve repair by Dr. Emelda Fear on June 07, 2018 where there was reported evidence of ruptured anterior papillary muscle. He has since been followed by Dr. Wyline Mood. He had a 2020 TEE showing mobile echodensity on the posterior leaflet and possibly surgical suture/torn chordae/vegetation. Per Dr. Verna Czech note, CT surgery at that time recommended continued medical therapy, considering repeat intervention at outpatient follow-up. It was thought perhaps an autoimmune disease may have causes some myxomatous degeneration. More recently he has had had several exacerbations of fluid overload, partly due to some missed Lasix doses, but follow-up echocardiogram showed possible severe MR. TEE 05/23/20 shoewd EF 40-45%, moderate-severely reduced RV function,  moderately enlarged RV, severely dilated LA/RA, difficult visualization of the MV but appeared to be severe MR with moderate MS (see report), moderate-severe TR. He was referred to CT surgery with Dr. Cornelius Moras and office staff tried to contact him multiple times but they were unable to get in touch with him originally. (Per note 06/2020 "He has been experiencing phone issues and recommended we contact his mother who is listed as his emergency contact if needing to get in touch with him in the future regarding appointments.") He apparently was hospitalized at [facility] for atrial flutter requiring cardioversion and initiation of Eliquis. Apparently this was in the setting of Covid too. He was seen back in the ED 08/23/20 with what was felt to be atrial flutter. He was cardioverted again. He saw Dr. Cornelius Moras 2/22 for consideration of repeat MV surgery, who referred him to Dr. Gala Romney with the advanced HF team. Dr. Gala Romney plans a R/LHC on 09/11/20, switched his lisinopril to [facility], and started amiodarone given high risk of recurrent atrial flutter. Dr. Gala Romney was concerned for h/o IVDA given track marks but patient denied use.  Cath plans? Given recent dccv - DB says OK UDS not checkedc CMET Cbc     Paroxysmal atrial flutter Chronic systolic CHF RV failure with PAH Valvular heart disease with severe mitral regurgitation, mitral stenosis, tricuspid regurgitation   Labwork independently reviewed: 08/2020 WBC 12.4 (appears persistent), Hgb 13.6, K 3.9, Cr 0.82, BNP 978, Mg 1.8 07/2020 albumin 2.9, AST 75, ALT 64, TSH wnl (CareEverywhere)  Past Medical History:  Diagnosis Date  . Anemia   . Autoimmune disorder (HCC)    pyoderma gangrenosum  . Chronic systolic heart failure (HCC)    a. EF 35-40% by echo in 07/2018 b. EF at  45% by repeat echo in 04/2020  . DVT (deep venous thrombosis) (HCC)    h/o  . Mitral regurgitation    a. s/p MV repair with resection of ruptured anterior papillary muscle  and reconstruction of papillary chord and placement of annuloplasty ringin 2019  . Myocardial infarction (HCC)   . Seronegative spondylitis (HCC)    arthritis  . Tricuspid regurgitation     Past Surgical History:  Procedure Laterality Date  . HERNIA REPAIR Right 2018  . MITRAL VALVE REPAIR  06/07/2018   [facility] - Dr Meda Klinefelter  . TEE WITHOUT CARDIOVERSION N/A 05/23/2020   Procedure: TRANSESOPHAGEAL ECHOCARDIOGRAM (TEE) WITH PROPOFOL;  Surgeon: [name], MD;  Location: [facility];  Service: Endoscopy;  Laterality: N/A;    Current Medications: No outpatient medications have been marked as taking for the 09/07/20 encounter (Appointment) with Laurann Montana, PA-C.   ***   Allergies:   Patient has no known allergies.   Social History   Socioeconomic History  . Marital status: Married    Spouse name: Not on file  . Number of children: Not on file  . Years of education: Not on file  . Highest education level: Not on file  Occupational History  . Not on file  Tobacco Use  . Smoking status: Former Games developer  . Smokeless tobacco: Never Used  Vaping Use  . Vaping Use: Not on file  Substance and Sexual Activity  . Alcohol use: No  . Drug use: No  . Sexual activity: Not on file  Other Topics Concern  . Not on file  Social History Narrative  . Not on file   Social Determinants of Health   Financial Resource Strain: Not on file  Food Insecurity: Not on file  Transportation Needs: Not on file  Physical Activity: Not on file  Stress: Not on file  Social Connections: Not on file     Family History:  The patient's ***family history includes Depression in his father; Diabetes in his father and paternal grandmother; Multiple sclerosis in his mother; Psoriasis in his mother.  ROS:   Please see the history of present illness. Otherwise, review of systems is positive for ***.  All other systems are reviewed and otherwise negative.    EKGs/Labs/Other  Studies Reviewed:    Studies reviewed are outlined and summarized above. Reports included below if pertinent.  2D echo 04/2020 1. Left ventricular ejection fraction, by estimation, is 45%. The left  ventricle has mildly decreased function. The left ventricle demonstrates  global hypokinesis. There is mild left ventricular hypertrophy. Left  ventricular diastolic parameters are  indeterminate.  2. Ventricular septum is flattened in systole and diastole suggesting RV  pressure and volume overload. RV appears mild to moderately enlarged with  mild to moderate dysfunction. Right ventricular systolic function mild to  moderately reduced. The right  ventricular size is mild to moderately enlarged.  3. Left atrial size was severely dilated.  4. Right atrial size was severely dilated.  5. Prior mitral valve repair. The MR jet is turbulent and poorly  visualized making it difficult to quantify. The MV to AV VTI is 3  suggesting possible severe MR. Elevated gradient across the MV repair of  10 mmHg. There is a linear density that crosses  the anulus that is difficult to distinguish, perhaps portion of the  repair. Recommend TEE to better evaluate anatomy and function of mitral  valve. . The mitral valve has been repaired/replaced. difficult to  quantify. At least moderate. mitral valve  regurgitation. The mean mitral valve gradient is 10.0 mmHg.  6. Tricuspid valve regurgitation is mild to moderate.  7. The aortic valve is tricuspid. Aortic valve regurgitation is mild. No  aortic stenosis is present.  8. Moderate pulmonary HTN, PASP is 46 mmHg.  9. The inferior vena cava is dilated in size with >50% respiratory  variability, suggesting right atrial pressure of 8 mmHg.   TEE 05/2020 IMPRESSIONS    1. Left ventricular ejection fraction, by estimation, is 40 to 45%. The  left ventricle has mildly decreased function.  2. Right ventricular systolic function moderately to severely  reduced.  The right ventricular size is moderately enlarged.  3. Left atrial size was severely dilated. No left atrial/left atrial  appendage thrombus was detected. The LAA emptying velocity was 60 cm/s.  4. Right atrial size was severely dilated.  5. Difficult visualization of mitral valve and regurgitant jet. Findings  consistent with prior repair. Thickened leaflets with restricted motion.  The MR jet was not amenable to PISA. Moderate mean gradient across the  valve of 6 mmHg. In images #[id number]  there appears to be regurgitation jet through the valve but also lateral  to the anular ring. The cumulative regurgitation appears to be severe. .  The mitral valve has been repaired/replaced. Severe mitral valve  regurgitation. Moderate mitral stenosis.  6. The tricuspid valve is abnormal. Tricuspid valve regurgitation is  moderate to severe.  7. The aortic valve is tricuspid. Aortic valve regurgitation is not  visualized. No aortic stenosis is present.  8. Technically difficult study, standard views were off axis particularly  of the mitral valve.     EKG:  EKG is ordered today, personally reviewed, demonstrating ***  Recent Labs: 08/23/2020: Magnesium 1.8 08/27/2020: B Natriuretic Peptide 978.5; BUN 13; Creatinine, Ser 0.82; Hemoglobin 13.6; Platelets 161; Potassium 3.9; Sodium 136  Recent Lipid Panel No results found for: CHOL, TRIG, HDL, CHOLHDL, VLDL, LDLCALC, LDLDIRECT  PHYSICAL EXAM:    VS:  There were no vitals taken for this visit.  BMI: There is no height or weight on file to calculate BMI.  GEN: Well nourished, well developed male in no acute distress HEENT: normocephalic, atraumatic Neck: no JVD, carotid bruits, or masses Cardiac: ***RRR; no murmurs, rubs, or gallops, no edema  Respiratory:  clear to auscultation bilaterally, normal work of breathing GI: soft, nontender, nondistended, + BS MS: no deformity or atrophy Skin: warm and dry, no rash Neuro:  Alert and  Oriented x 3, Strength and sensation are intact, follows commands Psych: euthymic mood, full affect  Wt Readings from Last 3 Encounters:  08/27/20 224 lb 3.2 oz (101.7 kg)  08/23/20 217 lb (98.4 kg)  08/03/20 207 lb (93.9 kg)     ASSESSMENT & PLAN:   1. ***  Disposition: F/u with ***   Medication Adjustments/Labs and Tests Ordered: Current medicines are reviewed at length with the patient today.  Concerns regarding medicines are outlined above. Medication changes, Labs and Tests ordered today are summarized above and listed in the Patient Instructions accessible in Encounters.   Signed, Laurann Montana, PA-C  09/07/2020 9:20 AM    [facility] [place] Location in [facility] [address] [place], [place] [address] Ph: [phone]; Fax [phone]

## 2020-09-10 ENCOUNTER — Other Ambulatory Visit (HOSPITAL_COMMUNITY)
Admission: RE | Admit: 2020-09-10 | Discharge: 2020-09-10 | Disposition: A | Discharge status: Home / Self Care | Payer: Medicaid Other | Source: Ambulatory Visit | Attending: Internal Medicine | Admitting: Internal Medicine

## 2020-09-10 ENCOUNTER — Ambulatory Visit (INDEPENDENT_AMBULATORY_CARE_PROVIDER_SITE_OTHER): Payer: Self-pay | Admitting: Dentistry

## 2020-09-10 ENCOUNTER — Other Ambulatory Visit: Payer: Self-pay

## 2020-09-10 VITALS — BP 113/86 | HR 68 | Temp 97.8°F

## 2020-09-10 DIAGNOSIS — Z7901 Long term (current) use of anticoagulants: Secondary | ICD-10-CM

## 2020-09-10 DIAGNOSIS — K029 Dental caries, unspecified: Secondary | ICD-10-CM

## 2020-09-10 DIAGNOSIS — Z20822 Contact with and (suspected) exposure to covid-19: Secondary | ICD-10-CM | POA: Diagnosis not present

## 2020-09-10 DIAGNOSIS — Z01812 Encounter for preprocedural laboratory examination: Secondary | ICD-10-CM | POA: Insufficient documentation

## 2020-09-10 DIAGNOSIS — K083 Retained dental root: Secondary | ICD-10-CM

## 2020-09-10 DIAGNOSIS — K036 Deposits [accretions] on teeth: Secondary | ICD-10-CM

## 2020-09-10 DIAGNOSIS — I340 Nonrheumatic mitral (valve) insufficiency: Secondary | ICD-10-CM

## 2020-09-10 DIAGNOSIS — K045 Chronic apical periodontitis: Secondary | ICD-10-CM

## 2020-09-10 DIAGNOSIS — Z01818 Encounter for other preprocedural examination: Secondary | ICD-10-CM

## 2020-09-10 DIAGNOSIS — K06010 Localized gingival recession, unspecified: Secondary | ICD-10-CM

## 2020-09-10 DIAGNOSIS — K141 Geographic tongue: Secondary | ICD-10-CM

## 2020-09-10 DIAGNOSIS — K08109 Complete loss of teeth, unspecified cause, unspecified class: Secondary | ICD-10-CM

## 2020-09-10 DIAGNOSIS — K0510 Chronic gingivitis, plaque induced: Secondary | ICD-10-CM

## 2020-09-10 LAB — SARS CORONAVIRUS 2 (TAT 6-24 HRS): SARS Coronavirus 2: NEGATIVE

## 2020-09-10 NOTE — Progress Notes (Signed)
Department of Dental Medicine     OUTPATIENT CONSULTATION  Service Date:   09/10/2020  Patient Name:  Donald [name] Date of Birth:   Jul 14, 1985 Medical Record Number: [id number]  Referring Provider:              Tressie Stalker, MD   PLAN & RECOMMENDATIONS  RECOMMENDATIONS > There are no current signs of acute dental infection including abscess, edema or erythema, or suspicious lesion requiring biopsy.  The patient does have several grossly decayed teeth with chronic periapical infections, retained root tips and periodontal concerns. >> Recommend extractions of all chronically infected teeth, retained root tips and teeth with gross decay to decrease the risk of perioperative and postoperative systemic infection and/or other complications. >>> Plan to discuss with medical team and coordinate treatment as needed.  The patient wishes to have extractions completed in the operating room under general anesthesia, pending medical team's recommendations.   >>  Recommend the patient establish care at a dental office of their choice for routine dental care including replacement of missing teeth, cleanings and exams. >>  Discussed in detail all treatment options with the patient and they are agreeable to the plan.   Thank you for consulting with [facility] and for the opportunity to participate in this patient's treatment.  Should you have any questions or concerns, please contact the [facility] at [phone].   09/10/2020      CONSULT NOTE   COVID 19 SCREENING: The patient denies symptoms concerning for COVID-19 infection including fever, chills, cough, or newly developed shortness of breath.   HISTORY OF PRESENT ILLNESS: >> Donald [name] is a very pleasant 35 y.o. male with h/o autoimmune disease, seronegative spondylitis, IBS, chronic systolic heart failure, tricuspid regurgitation, mitral regurgitation s/p mitral valve repair in 2019 and long-term use of  anticoagulation (on Eliquis) who was recently diagnosed with recurrent mitral regurgitation and is anticipating .  The patient presents today for a medically necessary dental consultation as part of their pre-cardiac surgical work-up.  DENTAL HISTORY: > The patient reports that it has been a very long time since he has seen a dentist.  He reports having one tooth on the lower left (points to #18) that is painful sometimes when food gets stuck in it, but the pain goes away once he removes it.  He denies any other dental/orofacial pain or sensitivity. >> Patient is able to manage oral secretions.  Patient denies dysphagia, odynophagia, dysphonia, SOB and neck pain.  Patient denies fever, rigors and malaise.   CHIEF COMPLAINT:  Here for a preoperative dental evaluation.   Patient Active Problem List   Diagnosis Date Noted  . Tricuspid regurgitation   . Mitral regurgitation   . Chronic systolic heart failure (HCC)   . Autoimmune disorder (HCC)   . Seronegative spondylitis (HCC)    Past Medical History:  Diagnosis Date  . Anemia   . Autoimmune disorder (HCC)    pyoderma gangrenosum  . Chronic systolic heart failure (HCC)    a. EF 35-40% by echo in 07/2018 b. EF at 45% by repeat echo in 04/2020  . DVT (deep venous thrombosis) (HCC)    h/o  . Mitral regurgitation    a. s/p MV repair with resection of ruptured anterior papillary muscle and reconstruction of papillary chord and placement of annuloplasty ringin 2019. b. 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  . Mitral stenosis   . Myocardial infarction (HCC)   . Paroxysmal atrial flutter (HCC)   .  Pyoderma gangrenosa   . Seronegative spondylitis (HCC)    arthritis  . Tricuspid regurgitation    Past Surgical History:  Procedure Laterality Date  . HERNIA REPAIR Right 2018  . MITRAL VALVE REPAIR  06/07/2018   [facility] - Dr Meda Klinefelter  . TEE WITHOUT CARDIOVERSION N/A 05/23/2020   Procedure: TRANSESOPHAGEAL ECHOCARDIOGRAM (TEE) WITH  PROPOFOL;  Surgeon: [name], MD;  Location: [facility];  Service: Endoscopy;  Laterality: N/A;   No Known Allergies Current Outpatient Medications  Medication Sig Dispense Refill  . apixaban (ELIQUIS) 5 MG TABS tablet Take 5 mg by mouth 2 (two) times daily.    . ASPIRIN ADULT LOW STRENGTH 81 MG EC tablet Take 81 mg by mouth daily.     . ferrous sulfate 325 (65 FE) MG tablet Take 1 tablet (325 mg total) by mouth daily. 90 tablet 1  . furosemide (LASIX) 40 MG tablet Take 1 tablet (40 mg total) by mouth 2 (two) times daily. 180 tablet 3  . metoprolol succinate (TOPROL-XL) 25 MG 24 hr tablet Take 1 tablet (25 mg total) by mouth daily. 90 tablet 2  . potassium chloride SA (KLOR-CON) 20 MEQ tablet Take 1 tablet (20 mEq total) by mouth 2 (two) times daily. 60 tablet 0  . sacubitril-valsartan (ENTRESTO) 24-26 MG Take 1 tablet by mouth 2 (two) times daily. 60 tablet 3   No current facility-administered medications for this visit.    LABS: Lab Results  Component Value Date   WBC 12.4 (H) 08/27/2020   HGB 13.6 08/27/2020   HCT 42.2 08/27/2020   MCV 84.2 08/27/2020   PLT 161 08/27/2020      Component Value Date/Time   NA 136 08/27/2020 1520   K 3.9 08/27/2020 1520   CL 102 08/27/2020 1520   CO2 27 08/27/2020 1520   GLUCOSE 79 08/27/2020 1520   BUN 13 08/27/2020 1520   CREATININE 0.82 08/27/2020 1520   CALCIUM 9.1 08/27/2020 1520   GFRNONAA >60 08/27/2020 1520   GFRAA >60 01/09/2015 1630   No results found for: INR, PROTIME No results found for: PTT  Social History   Socioeconomic History  . Marital status: Married    Spouse name: Not on file  . Number of children: Not on file  . Years of education: Not on file  . Highest education level: Not on file  Occupational History  . Not on file  Tobacco Use  . Smoking status: Former Games developer  . Smokeless tobacco: Never Used  Vaping Use  . Vaping Use: Not on file  Substance and Sexual Activity  . Alcohol use: No  .  Drug use: No  . Sexual activity: Not on file  Other Topics Concern  . Not on file  Social History Narrative  . Not on file   Social Determinants of Health   Financial Resource Strain: Not on file  Food Insecurity: Not on file  Transportation Needs: Not on file  Physical Activity: Not on file  Stress: Not on file  Social Connections: Not on file  Intimate Partner Violence: Not on file   Family History  Problem Relation Age of Onset  . Multiple sclerosis Mother   . Psoriasis Mother   . Depression Father   . Diabetes Father   . Diabetes Paternal Grandmother      REVIEW OF SYSTEMS: Reviewed with the patient as per HPI. PSYCH: Patient denies having dental phobia.  VITAL SIGNS: BP 113/86 (BP Location: Right Arm)  Pulse 68   Temp 97.8 F (36.6 C) (Oral)    PHYSICAL EXAM: >> General:  Well-developed, comfortable and in no apparent distress. >> Neurological:  Alert and oriented to person, place and  time. >> Extraoral:  Facial symmetry present without any edema or erythema.  No swelling or lymphadenopathy.  TMJ asymptomatic without clicks or crepitations. >> Maximum Interincisal Opening: 55 mm >> Intraoral:  Soft tissues appear well-perfused and mucous membranes moist.  FOM and vestibules soft and not raised. Oral cavity without mass or lesion. No signs of infection, parulis, sinus tract, edema or erythema evident upon exam.   (+) Geographic tongue, bilateral linea alba   DENTAL EXAM: Hard tissue exam completed and charted.  >> Dentition:  Overall fair remaining dentition.  Missing tooth #32, caries, retained root tips, existing restorations. >> The patient is maintaining poor oral hygiene.  >> Periodontal: Inflamed, erythematous gingival tissue. Generalized plaque and calculus accumulation. Localized gingival recession on facial and lingual surfaces of mandibular anterior teeth d/t chronic inflammation.  >> Caries: Clinical caries charted. #15 and #18 severe decay/broken  teeth. >> Retained Root Tips: #16, #30 >> Endodontics: #18 symptomatic irreversible pulpitis >> Vitality testing/other testing: Symptomatic tooth #18 with radiographic findings of chronic apical infection. >> Occlusion: Unable to assess molar occlusion. Non-functional teeth #s 1, 15, 17 and 18. Supra-erupted teeth #s 1 and 17.   RADIOGRAPHIC EXAM: PAN and Full Mouth Series exposed and interpreted.  >> Condyles seated bilaterally in fossas.  No evidence of abnormal pathology.  All visualized osseous structures appear WNL. #1 distal drift, #17 and #31 mesial drift.  >> Localized mild horizontal bone loss consistent with mild periodontitis vs gingival recession. Radiographic calculus accumulation evident. >> Missing tooth #32. Caries- #1, #2, #15, #17 and #18 deep decay approximating the pulp. Retained root tips #16 and #30. #15, #16, #18 and #30 have periapical radiolucencies. #3, #14 and #19 existing restorations. Incipient lesions on #21D, #76M, #77M, #176M and #66M. Possible previous endodontic therapy on tooth #30 (retained root remnants).   ASSESSMENT:  1. Recurrent mitral regurgitation (h/o MVR in 2019) 2. Preoperative dental consultation 3. Long-term use of anticoagulation (on Eliquis) 4. Missing teeth 5. Caries 6. Retained root tips 7. Chronic apical periodontitis 8. Gingivitis 9. Accretions on teeth 10. Gingival recession, localized 11. Geographic tongue 12. Postoperative bleeding risk   PLAN AND RECOMMENDATIONS: >  I discussed the risks, benefits, and complications of various scenarios with the patient in relationship to their medical and dental conditions, which included systemic infection such as endocarditis, bacteremia or other serious issues that could potentially occur either before, during or after their anticipated surgery if dental/oral concerns are not addressed.  I explained that if any chronic or acute dental/oral infection(s) are addressed and subsequently not  maintained following medical optimization and recovery, their risk of the previously mentioned complications are just as high and could potentially occur postoperatively.  I explained all significant findings of the dental consultation with the patient including several teeth with deep cavities that are not able to be restored with root canal/crowns, retained root tips, and teeth numbers 1 and 17 which are non-functional 3rd molars with deep cavities, and significant tartar or calculus accumulation and inflammation of his gums, and the recommended care including extractions of non-restorable teeth (#15 and #18), retained root tips (#16 and #30), and 3rd molars (#1 and #17) in order to optimize them for heart surgery from a dental standpoint.  The patient verbalized understanding of all findings, discussion,  and recommendations. >>  We then discussed various treatment options to include no treatment, multiple extractions with alveoloplasty, pre-prosthetic surgery as indicated, periodontal therapy, dental restorations, root canal therapy, crown and bridge therapy, implant therapy, and replacement of missing teeth as indicated.  The patient verbalized understanding of all options, and currently wishes to proceed with extractions of teeth numbers 1, 15, 16, 17, 18 and 30 with alveoloplasty as needed.  Offered the patient the option of going to an oral surgeon for extractions or scheduling with [place] which would be in the operating room under general anesthesia due to his cardiac condition and medical history.  He elected to be seen in the OR at [facility].  Discussed with patient how we may have to hold his Eliquis prior to extractions to help with postoperative bleeding, but this is pending his medical team's recommendations.  He expressed understanding.  Of note, patient reports that he cannot do April 21st for surgery, otherwise is able to schedule as soon as possible. >>>  Plan to discuss all findings and recommendations  with medical team and coordinate future care as needed.  <> The patient tolerated today's visit well.  All questions and concerns were addressed and answered, and the patient departed in stable condition.   I spent in excess of 120 minutes during the conduct of this consultation and >50% of this time involved direct face-to-face encounter for counseling and/or coordination of the patient's care. Jadira Nierman B. Chales Salmon, D.M.D.

## 2020-09-10 NOTE — Patient Instructions (Signed)
[facility] Dianna Ewald B. Lichelle Viets, D.M.D. Phone: [phone] Fax: [phone]   It was a pleasure seeing you today!  Please refer to the information below regarding your dental visit, and call us should you have any questions or concerns that may come up after you leave.   Thank you for giving us the opportunity to provide care for you.  If there is anything we can do for you, please let us know.    HEART VALVES AND MOUTH CARE   FACTS:  If you have any infection in your mouth, it can infect your heart valve.  If you heart valve is infected, you will be seriously ill.  Infections in the mouth can be SILENT and do not always cause pain.  Examples of infections in the mouth are gum disease, dental cavities, and abscesses.  Some possible signs of infection are: Bad breath, bleeding gums, or teeth that are sensitive to sweets, hot, and/or cold. There are many other signs as well.   WHAT YOU HAVE TO DO:  Brush your teeth after meals and at bedtime. Spend at least 2 minutes brushing well, especially behind your back teeth and all around your teeth that stand alone. Brush at the gumline also.  Do not go to bed without brushing your teeth and flossing.  If your gums bleed when you brush or floss, do NOT stop brushing or flossing.  Bleeding can be a sign of inflammation or irritation from bacteria.  It usually means that your gums need more attention and better cleaning.   If your Dentist or Dr. Blaze Nylund gave you a prescription mouthwash to use, make sure to use it as directed. If you run out of the medication, get a refill at the pharmacy.  If you were given any other medications or directions by your Dentist, please follow them. If you did not understand the directions or forget what you were told, please call. We will be happy to refresh your memory.  If you need antibiotics before dental procedures, make sure you take them one hour prior to every dental  visit as directed.   Get a dental check-up every 4-6 months in order to keep your mouth healthy, or to find and treat any new infection. You will most likely need your teeth cleaned or gums treated at the same time.  If you are not able to come in for your scheduled appointment, call your Dentist as soon as possible to reschedule.  If you have a problem in between dental visits, call your Dentist.    QUESTIONS? . Call our office during office hours [phone].   WE ARE A TEAM.  OUR GOAL IS:  HEALTHY MOUTH, HEALTHY HEART 

## 2020-09-11 ENCOUNTER — Encounter (HOSPITAL_COMMUNITY)
Admission: RE | Disposition: A | Discharge status: Home / Self Care | Payer: Self-pay | Source: Home / Self Care | Attending: Internal Medicine

## 2020-09-11 ENCOUNTER — Other Ambulatory Visit: Payer: Self-pay

## 2020-09-11 ENCOUNTER — Ambulatory Visit (HOSPITAL_COMMUNITY)
Admission: RE | Admit: 2020-09-11 | Discharge: 2020-09-11 | Disposition: A | Discharge status: Home / Self Care | Payer: Medicaid Other | Attending: Internal Medicine | Admitting: Internal Medicine

## 2020-09-11 DIAGNOSIS — Z7901 Long term (current) use of anticoagulants: Secondary | ICD-10-CM | POA: Insufficient documentation

## 2020-09-11 DIAGNOSIS — I428 Other cardiomyopathies: Secondary | ICD-10-CM | POA: Insufficient documentation

## 2020-09-11 DIAGNOSIS — I340 Nonrheumatic mitral (valve) insufficiency: Secondary | ICD-10-CM

## 2020-09-11 DIAGNOSIS — I5022 Chronic systolic (congestive) heart failure: Secondary | ICD-10-CM

## 2020-09-11 DIAGNOSIS — Z79899 Other long term (current) drug therapy: Secondary | ICD-10-CM | POA: Insufficient documentation

## 2020-09-11 DIAGNOSIS — I4892 Unspecified atrial flutter: Secondary | ICD-10-CM | POA: Insufficient documentation

## 2020-09-11 DIAGNOSIS — Z87891 Personal history of nicotine dependence: Secondary | ICD-10-CM | POA: Insufficient documentation

## 2020-09-11 DIAGNOSIS — I5082 Biventricular heart failure: Secondary | ICD-10-CM | POA: Insufficient documentation

## 2020-09-11 DIAGNOSIS — Z7982 Long term (current) use of aspirin: Secondary | ICD-10-CM | POA: Insufficient documentation

## 2020-09-11 DIAGNOSIS — I110 Hypertensive heart disease with heart failure: Secondary | ICD-10-CM | POA: Insufficient documentation

## 2020-09-11 HISTORY — PX: RIGHT/LEFT HEART CATH AND CORONARY ANGIOGRAPHY: CATH118266

## 2020-09-11 LAB — POCT I-STAT EG7
Acid-Base Excess: 0 mmol/L (ref 0.0–2.0)
Acid-Base Excess: 1 mmol/L (ref 0.0–2.0)
Acid-Base Excess: 2 mmol/L (ref 0.0–2.0)
Bicarbonate: 25.6 mmol/L (ref 20.0–28.0)
Bicarbonate: 26.9 mmol/L (ref 20.0–28.0)
Bicarbonate: 28 mmol/L (ref 20.0–28.0)
Calcium, Ion: 1.07 mmol/L — ABNORMAL LOW (ref 1.15–1.40)
Calcium, Ion: 1.09 mmol/L — ABNORMAL LOW (ref 1.15–1.40)
Calcium, Ion: 1.22 mmol/L (ref 1.15–1.40)
HCT: 37 % — ABNORMAL LOW (ref 39.0–52.0)
HCT: 37 % — ABNORMAL LOW (ref 39.0–52.0)
HCT: 40 % (ref 39.0–52.0)
Hemoglobin: 12.6 g/dL — ABNORMAL LOW (ref 13.0–17.0)
Hemoglobin: 12.6 g/dL — ABNORMAL LOW (ref 13.0–17.0)
Hemoglobin: 13.6 g/dL (ref 13.0–17.0)
O2 Saturation: 70 %
O2 Saturation: 71 %
O2 Saturation: 75 %
Potassium: 3.4 mmol/L — ABNORMAL LOW (ref 3.5–5.1)
Potassium: 3.4 mmol/L — ABNORMAL LOW (ref 3.5–5.1)
Potassium: 3.8 mmol/L (ref 3.5–5.1)
Sodium: 139 mmol/L (ref 135–145)
Sodium: 142 mmol/L (ref 135–145)
Sodium: 142 mmol/L (ref 135–145)
TCO2: 27 mmol/L (ref 22–32)
TCO2: 28 mmol/L (ref 22–32)
TCO2: 29 mmol/L (ref 22–32)
pCO2, Ven: 43.2 mmHg — ABNORMAL LOW (ref 44.0–60.0)
pCO2, Ven: 44.4 mmHg (ref 44.0–60.0)
pCO2, Ven: 46.3 mmHg (ref 44.0–60.0)
pH, Ven: 7.38 (ref 7.250–7.430)
pH, Ven: 7.39 (ref 7.250–7.430)
pH, Ven: 7.391 (ref 7.250–7.430)
pO2, Ven: 38 mmHg (ref 32.0–45.0)
pO2, Ven: 38 mmHg (ref 32.0–45.0)
pO2, Ven: 40 mmHg (ref 32.0–45.0)

## 2020-09-11 LAB — POCT I-STAT 7, (LYTES, BLD GAS, ICA,H+H)
Acid-base deficit: 1 mmol/L (ref 0.0–2.0)
Bicarbonate: 23.1 mmol/L (ref 20.0–28.0)
Calcium, Ion: 0.89 mmol/L — CL (ref 1.15–1.40)
HCT: 33 % — ABNORMAL LOW (ref 39.0–52.0)
Hemoglobin: 11.2 g/dL — ABNORMAL LOW (ref 13.0–17.0)
O2 Saturation: 99 %
Potassium: 3 mmol/L — ABNORMAL LOW (ref 3.5–5.1)
Sodium: 145 mmol/L (ref 135–145)
TCO2: 24 mmol/L (ref 22–32)
pCO2 arterial: 37 mmHg (ref 32.0–48.0)
pH, Arterial: 7.403 (ref 7.350–7.450)
pO2, Arterial: 152 mmHg — ABNORMAL HIGH (ref 83.0–108.0)

## 2020-09-11 SURGERY — RIGHT/LEFT HEART CATH AND CORONARY ANGIOGRAPHY
Anesthesia: LOCAL

## 2020-09-11 MED ORDER — ONDANSETRON HCL 4 MG/2ML IJ SOLN: 4.0000 mg | Freq: Four times a day (QID) | INTRAMUSCULAR | Status: DC | PRN

## 2020-09-11 MED ORDER — SODIUM CHLORIDE 0.9 % IV SOLN: INTRAVENOUS | Status: DC

## 2020-09-11 MED ORDER — SODIUM CHLORIDE 0.9% FLUSH: 3.0000 mL | INTRAVENOUS | Status: DC | PRN

## 2020-09-11 MED ORDER — HEPARIN (PORCINE) IN NACL 1000-0.9 UT/500ML-% IV SOLN
INTRAVENOUS | Status: AC
  Filled 2020-09-11: qty 1000

## 2020-09-11 MED ORDER — VERAPAMIL HCL 2.5 MG/ML IV SOLN
INTRAVENOUS | Status: AC
  Filled 2020-09-11: qty 2

## 2020-09-11 MED ORDER — FENTANYL CITRATE (PF) 100 MCG/2ML IJ SOLN
INTRAMUSCULAR | Status: DC | PRN
  Administered 2020-09-11: 25 ug via INTRAVENOUS

## 2020-09-11 MED ORDER — HEPARIN SODIUM (PORCINE) 1000 UNIT/ML IJ SOLN
INTRAMUSCULAR | Status: AC
  Filled 2020-09-11: qty 1

## 2020-09-11 MED ORDER — MIDAZOLAM HCL 2 MG/2ML IJ SOLN
INTRAMUSCULAR | Status: DC | PRN
  Administered 2020-09-11: 2 mg via INTRAVENOUS

## 2020-09-11 MED ORDER — SODIUM CHLORIDE 0.9 % IV SOLN: 250.0000 mL | INTRAVENOUS | Status: DC | PRN

## 2020-09-11 MED ORDER — SODIUM CHLORIDE 0.9% FLUSH: 3.0000 mL | Freq: Two times a day (BID) | INTRAVENOUS | Status: DC

## 2020-09-11 MED ORDER — FENTANYL CITRATE (PF) 100 MCG/2ML IJ SOLN
INTRAMUSCULAR | Status: AC
  Filled 2020-09-11: qty 2

## 2020-09-11 MED ORDER — VERAPAMIL HCL 2.5 MG/ML IV SOLN
INTRAVENOUS | Status: DC | PRN
  Administered 2020-09-11: 10 mL via INTRA_ARTERIAL

## 2020-09-11 MED ORDER — ASPIRIN 81 MG PO CHEW
81.0000 mg | CHEWABLE_TABLET | Freq: Once | ORAL | Status: AC
  Administered 2020-09-11: 81 mg via ORAL
  Filled 2020-09-11: qty 1

## 2020-09-11 MED ORDER — IOHEXOL 350 MG/ML SOLN
INTRAVENOUS | Status: DC | PRN
  Administered 2020-09-11: 30 mL

## 2020-09-11 MED ORDER — LABETALOL HCL 5 MG/ML IV SOLN: 10.0000 mg | INTRAVENOUS | Status: DC | PRN

## 2020-09-11 MED ORDER — MIDAZOLAM HCL 2 MG/2ML IJ SOLN
INTRAMUSCULAR | Status: AC
  Filled 2020-09-11: qty 2

## 2020-09-11 MED ORDER — LIDOCAINE HCL (PF) 1 % IJ SOLN
INTRAMUSCULAR | Status: DC | PRN
  Administered 2020-09-11 (×2): 2 mL

## 2020-09-11 MED ORDER — ACETAMINOPHEN 325 MG PO TABS: 650.0000 mg | ORAL_TABLET | ORAL | Status: DC | PRN

## 2020-09-11 MED ORDER — HEPARIN SODIUM (PORCINE) 1000 UNIT/ML IJ SOLN
INTRAMUSCULAR | Status: DC | PRN
  Administered 2020-09-11: 5000 [IU] via INTRAVENOUS

## 2020-09-11 MED ORDER — LIDOCAINE HCL (PF) 1 % IJ SOLN
INTRAMUSCULAR | Status: AC
  Filled 2020-09-11: qty 30

## 2020-09-11 MED ORDER — HEPARIN (PORCINE) IN NACL 1000-0.9 UT/500ML-% IV SOLN
INTRAVENOUS | Status: DC | PRN
  Administered 2020-09-11 (×2): 500 mL

## 2020-09-11 MED ORDER — HYDRALAZINE HCL 20 MG/ML IJ SOLN: 10.0000 mg | INTRAMUSCULAR | Status: DC | PRN

## 2020-09-11 SURGICAL SUPPLY — 10 items
CATH 5FR JL3.5 JR4 ANG PIG MP (CATHETERS) ×2 IMPLANT
CATH SWAN GANZ 7F STRAIGHT (CATHETERS) ×2 IMPLANT
DEVICE RAD COMP TR BAND LRG (VASCULAR PRODUCTS) ×2 IMPLANT
GLIDESHEATH SLEND SS 6F .021 (SHEATH) ×2 IMPLANT
GLIDESHEATH SLENDER 7FR .021G (SHEATH) ×2 IMPLANT
GUIDEWIRE .025 260CM (WIRE) ×2 IMPLANT
GUIDEWIRE INQWIRE 1.5J.035X260 (WIRE) ×1 IMPLANT
INQWIRE 1.5J .035X260CM (WIRE) ×2
PACK CARDIAC CATHETERIZATION (CUSTOM PROCEDURE TRAY) ×2 IMPLANT
TRANSDUCER W/STOPCOCK (MISCELLANEOUS) ×2 IMPLANT

## 2020-09-11 NOTE — Interval H&P Note (Signed)
History and Physical Interval Note:  09/11/2020 2:48 PM  Donald [name]  has presented today for surgery, with the diagnosis of heart failure - mitral regurgitation.  The various methods of treatment have been discussed with the patient and family. After consideration of risks, benefits and other options for treatment, the patient has consented to  Procedure(s): RIGHT/LEFT HEART CATH AND CORONARY ANGIOGRAPHY (N/A) as a surgical intervention.  The patient's history has been reviewed, patient examined, no change in status, stable for surgery.  I have reviewed the patient's chart and labs.  Questions were answered to the patient's satisfaction.     Aryav Wimberly

## 2020-09-11 NOTE — Discharge Instructions (Signed)
Radial Site Care  ELEVATE RIGHT WRIST 24 HOURS INCREASE FLUIDS FOR 3 DAYS TO FLUSH KIDNEYS  RESTART ELIQUIS TONIGHT 10 PM  This sheet gives you information about how to care for yourself after your procedure. Your health care provider may also give you more specific instructions. If you have problems or questions, contact your health care provider. What can I expect after the procedure? After the procedure, it is common to have:  Bruising and tenderness at the catheter insertion area. Follow these instructions at home: Medicines  Take over-the-counter and prescription medicines only as told by your health care provider. Insertion site care  Follow instructions from your health care provider about how to take care of your insertion site. Make sure you: ? Wash your hands with soap and water before you change your bandage (dressing). If soap and water are not available, use hand sanitizer. ? Change your dressing as told by your health care provider. ? Leave stitches (sutures), skin glue, or adhesive strips in place. These skin closures may need to stay in place for 2 weeks or longer. If adhesive strip edges start to loosen and curl up, you may trim the loose edges. Do not remove adhesive strips completely unless your health care provider tells you to do that.  Check your insertion site every day for signs of infection. Check for: ? Redness, swelling, or pain. ? Fluid or blood. ? Pus or a bad smell. ? Warmth.  Do not take baths, swim, or use a hot tub until your health care provider approves.  You may shower 24-48 hours after the procedure, or as directed by your health care provider. ? Remove the dressing and gently wash the site with plain soap and water. ? Pat the area dry with a clean towel. ? Do not rub the site. That could cause bleeding.  Do not apply powder or lotion to the site. Activity  For 24 hours after the procedure, or as directed by your health care provider: ? Do  not flex or bend the affected arm. ? Do not push or pull heavy objects with the affected arm. ? Do not drive yourself home from the hospital or clinic. You may drive 24 hours after the procedure unless your health care provider tells you not to. ? Do not operate machinery or power tools.  Do not lift anything that is heavier than 10 lb (4.5 kg), or the limit that you are told, until your health care provider says that it is safe.  Ask your health care provider when it is okay to: ? Return to work or school. ? Resume usual physical activities or sports. ? Resume sexual activity.   General instructions  If the catheter site starts to bleed, raise your arm and put firm pressure on the site. If the bleeding does not stop, get help right away. This is a medical emergency.  If you went home on the same day as your procedure, a responsible adult should be with you for the first 24 hours after you arrive home.  Keep all follow-up visits as told by your health care provider. This is important. Contact a health care provider if:  You have a fever.  You have redness, swelling, or yellow drainage around your insertion site. Get help right away if:  You have unusual pain at the radial site.  The catheter insertion area swells very fast.  The insertion area is bleeding, and the bleeding does not stop when you hold steady pressure  on the area.  Your arm or hand becomes pale, cool, tingly, or numb. These symptoms may represent a serious problem that is an emergency. Do not wait to see if the symptoms will go away. Get medical help right away. Call your local emergency services (911 in the [place] Do not drive yourself to the hospital. Summary  After the procedure, it is common to have bruising and tenderness at the site.  Follow instructions from your health care provider about how to take care of your radial site wound. Check the wound every day for signs of infection.  Do not lift anything  that is heavier than 10 lb (4.5 kg), or the limit that you are told, until your health care provider says that it is safe. This information is not intended to replace advice given to you by your health care provider. Make sure you discuss any questions you have with your health care provider. Document Revised: 07/08/2017 Document Reviewed: 07/08/2017 Elsevier Patient Education  2021 [employer].

## 2020-09-12 ENCOUNTER — Encounter (HOSPITAL_COMMUNITY): Payer: Self-pay | Admitting: Internal Medicine

## 2020-09-17 ENCOUNTER — Encounter: Payer: Self-pay | Admitting: Thoracic Surgery (Cardiothoracic Vascular Surgery)

## 2020-09-19 ENCOUNTER — Encounter (HOSPITAL_COMMUNITY): Payer: Self-pay | Admitting: Vascular Surgery

## 2020-09-19 NOTE — Progress Notes (Signed)
Anesthesia Chart Review:  Case: [id number] Date/Time: 09/20/20 0730   Procedure: MULTIPLE EXTRACTION WITH ALVEOLOPLASTY (Bilateral )   Anesthesia type: General   Pre-op diagnosis: DENTAL CARIES,PERIDONTAL DISEASE   [facility] 12 / [facility]   Surgeons: Sharman Cheek, DMD      DISCUSSION: Patient is a 35 year old male scheduled for the above procedure. 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 Seen by Dr. Chales Salmon 09/10/20 and by Dr. Gala Romney in office 08/27/20 and on 09/11/20 for RHC/LHC (see below).   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   - Admitted to [facility] 06/06/18-06/13/18 with acute respiratory distress. He had cardiac arrest on induction for intubation with ROSC after ~ 2 minutes. Subsequent echocardiograms revealed anterior leaflet MVP and possible flail A1 scallop with severe MR. He underwent MV repair with resection of ruptured anterior papillary muscle, reconstruction of papillary chord with two Neo-chords and placement of Simplici-T Annuloplasty ring via superior septal approach on 06/07/18.   - Admission [facility] 07/17/20-07/20/20 for a-flutter with RVR and + COVID-19. He converted back to SR after receiving Cardizem, IV b-blocker and Lasix. Dr. Wyline Mood was contacted and was in agreement with starting Eliquis.   - ED visit 08/23/20 [facility] for aflutter with RVR, s/p synchronized  cardioversion in the ED (already on Eliquis). Plan for referral to [facility].   + COVID-19 test on 07/17/20 at [facility], see Care Everywhere. Had a negative COVID-19 test on 09/10/20.   Per 09/10/20 note by Dr. Chales Salmon, holding Eliquis for dental procedure  Pending his medical team's recommendations. He in on Eliquis 5 mg BID and ASA 81 mg daily. Staff will need to clarify what he has been instructed once they can get a hold of him by telephone.   UPDATE: I spoke with Dr. Chales Salmon, she spoke with Donald [name] this morning, and he was in [place]. She is planning to cancel his case for tomorrow. She discussed need for compliance. She added that often his mother has to be contacted to get in touch with him.     VS:  BP Readings from Last 3 Encounters:  09/11/20 (!) 108/96  09/10/20 113/86  08/27/20 130/80   Pulse Readings from Last 3 Encounters:  09/11/20 (!) 59  09/10/20 68  08/27/20 63    PROVIDERS: Patient, No Pcp Per (Inactive) - Dina Rich, MD is cardiologist - Arvilla Meres, MD is HF cardiologist - Tressie Stalker, MD is CT surgeon. Last seen 08/03/20, but he cancelled 09/17/20 visit.  - Dermatologist is with [facility]. Last visit with resident Karilyn Cota, MD on 02/13/20 for follow-up pyoderma gangrenosum, acne conglobata, hidradenitis suppurativa.    LABS: For day of surgery as indicated. Most recent results include: Lab Results  Component Value Date   WBC 12.4 (H) 08/27/2020   HGB 12.6 (L) 09/11/2020   HCT 37.0 (L) 09/11/2020   PLT 161 08/27/2020   GLUCOSE 79 08/27/2020   ALT 16 (L) 01/09/2015   AST 33 01/09/2015  NA 142 09/11/2020   K 3.4 (L) 09/11/2020   CL 102 08/27/2020   CREATININE 0.82 08/27/2020   BUN 13 08/27/2020   CO2 27 08/27/2020     IMAGES: 1V CXR 07/19/20 [facility])  FINDINGS:  Cardiac shadow is enlarged but stable. Postsurgical changes are  again seen. Mild vascular congestion is again noted. No significant  edema is seen. No focal infiltrate is  noted. IMPRESSION:  Mild vascular congestion without acute abnormality.   EKG: 08/23/20 02:11:04 (post cardioversion) Sinus rhythm at 81 bpm Multiple premature complexes, vent & supraven Probable inferior infarct, old Anterolateral infarct, age indeterminate Confirmed by Blane Ohara [phone]) on 08/24/2020 2:26:15 PM   CV: RHC/LHC 09/11/20: Findings: RA = 7 RV = 49/10 PA = 48/16 (32) PCW = 17 (v=32) Fick cardiac output/index = 5.3/2.5 PVR = 2.9 WU Ao sat = 99% PA sat = 71%, 70% High SVC sat =  75%  Assessment: 1. Normal coronary arteries 2. NICM EF 30-35% 3. Severe MR with prominent v-waves in PCWP tracing 4. Mild pulmonary venous HTN with normal CO  Plan/Discussion: F/u with Dr. Cornelius Moras for possible MV replacement.    TEE 05/23/20: IMPRESSIONS  1. Left ventricular ejection fraction, by estimation, is 40 to 45%. The  left ventricle has mildly decreased function.  2. Right ventricular systolic function moderately to severely reduced.  The right ventricular size is moderately enlarged.  3. Left atrial size was severely dilated. No left atrial/left atrial  appendage thrombus was detected. The LAA emptying velocity was 60 cm/s.  4. Right atrial size was severely dilated.  5. Difficult visualization of mitral valve and regurgitant jet. Findings  consistent with prior repair. Thickened leaflets with restricted motion.  The MR jet was not amenable to PISA. Moderate mean gradient across the  valve of 6 mmHg. In images #[id number]  there appears to be regurgitation jet through the valve but also lateral  to the anular ring. The cumulative regurgitation appears to be severe. .  The mitral valve has been repaired/replaced. Severe mitral valve  regurgitation. Moderate mitral stenosis.  6. The tricuspid valve is abnormal. Tricuspid valve regurgitation is  moderate to severe.  7. The aortic valve is tricuspid. Aortic valve regurgitation is not  visualized. No aortic  stenosis is present.  8. Technically difficult study, standard views were off axis particularly  of the mitral valve.    Past Medical History:  Diagnosis Date  . Anemia   . Autoimmune disorder (HCC)    pyoderma gangrenosum  . Chronic systolic heart failure (HCC)    a. EF 35-40% by echo in 07/2018 b. EF at 45% by repeat echo in 04/2020  . DVT (deep venous thrombosis) (HCC)    h/o  . Mitral regurgitation    a. s/p MV repair with resection of ruptured anterior papillary muscle and reconstruction of papillary chord and placement of annuloplasty ringin 2019. b. 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  . Mitral stenosis   . Myocardial infarction (HCC)   . Paroxysmal atrial flutter (HCC)   . Pyoderma gangrenosa   . Seronegative spondylitis (HCC)    arthritis  . Tricuspid regurgitation     Past Surgical History:  Procedure Laterality Date  . HERNIA REPAIR Right 2018  . MITRAL VALVE REPAIR  06/07/2018   [facility] - Dr Meda Klinefelter  . RIGHT/LEFT HEART CATH AND CORONARY ANGIOGRAPHY N/A 09/11/2020   Procedure: RIGHT/LEFT HEART CATH AND CORONARY ANGIOGRAPHY;  Surgeon: Dolores Patty, MD;  Location: [facility] INVASIVE CV LAB;  Service: Cardiovascular;  Laterality: N/A;  . TEE WITHOUT CARDIOVERSION N/A 05/23/2020   Procedure: TRANSESOPHAGEAL ECHOCARDIOGRAM (TEE) WITH PROPOFOL;  Surgeon: [name], MD;  Location: [facility] ENDO SUITE;  Service: Endoscopy;  Laterality: N/A;    MEDICATIONS: No current facility-administered medications for this encounter.   Marland Kitchen apixaban (ELIQUIS) 5 MG TABS tablet  . ASPIRIN ADULT LOW STRENGTH 81 MG EC tablet  . ferrous sulfate 325 (65 FE) MG tablet  . furosemide (LASIX) 40 MG tablet  . metoprolol succinate (TOPROL-XL) 25 MG 24 hr tablet  . potassium chloride SA (KLOR-CON) 20 MEQ tablet  . sacubitril-valsartan (ENTRESTO) 24-26 MG    Shonna Chock, PA-C Surgical Short Stay/Anesthesiology [facility] Phone [phone] [facility] Phone [phone] 09/19/2020 12:30  PM

## 2020-09-20 ENCOUNTER — Ambulatory Visit (HOSPITAL_COMMUNITY): Admission: RE | Admit: 2020-09-20 | Payer: Medicaid Other | Source: Home / Self Care | Admitting: Dentistry

## 2020-09-20 ENCOUNTER — Encounter: Payer: Self-pay | Admitting: Thoracic Surgery (Cardiothoracic Vascular Surgery)

## 2020-09-20 ENCOUNTER — Encounter (HOSPITAL_COMMUNITY): Admission: RE | Payer: Self-pay | Source: Home / Self Care

## 2020-09-20 SURGERY — MULTIPLE EXTRACTION WITH ALVEOLOPLASTY
Anesthesia: General | Laterality: Bilateral

## 2020-09-24 ENCOUNTER — Telehealth (HOSPITAL_COMMUNITY): Payer: Self-pay | Admitting: Pharmacy Technician

## 2020-09-24 ENCOUNTER — Encounter: Payer: Self-pay | Admitting: Thoracic Surgery (Cardiothoracic Vascular Surgery)

## 2020-09-24 NOTE — Telephone Encounter (Signed)
Called [employer] to check the status of the patient's application. Representative stated that there needed to be a no income letter attached to the application. I have already confirmed once that there is one attached to the end of the application.  Spoke with a Production designer, theatre/television/film who confirmed that she also saw the letter and is going to push it along to get a final determination.  Will follow up.

## 2020-10-04 ENCOUNTER — Telehealth (HOSPITAL_COMMUNITY): Payer: Self-pay | Admitting: Pharmacy Technician

## 2020-10-04 NOTE — Telephone Encounter (Signed)
Called [employer] to check the status of the patient's application. Representative stated the application was sent back to a manager for processing and marked as urgent.  Will follow up.

## 2020-10-05 NOTE — Telephone Encounter (Signed)
Advanced Heart Failure Patient Advocate Encounter   Patient was approved to receive Entresto from [employer]  Patient ID: [id number] Effective dates: 10/05/20 through 10/05/21  Archer Asa, CPhT

## 2020-10-18 ENCOUNTER — Ambulatory Visit (INDEPENDENT_AMBULATORY_CARE_PROVIDER_SITE_OTHER): Payer: Medicaid Other | Admitting: Cardiology

## 2020-10-18 ENCOUNTER — Other Ambulatory Visit: Payer: Self-pay

## 2020-10-18 ENCOUNTER — Encounter: Payer: Self-pay | Admitting: Cardiology

## 2020-10-18 VITALS — BP 142/76 | HR 71 | Ht 69.0 in | Wt 217.0 lb

## 2020-10-18 DIAGNOSIS — I5022 Chronic systolic (congestive) heart failure: Secondary | ICD-10-CM

## 2020-10-18 DIAGNOSIS — I340 Nonrheumatic mitral (valve) insufficiency: Secondary | ICD-10-CM

## 2020-10-18 DIAGNOSIS — I4892 Unspecified atrial flutter: Secondary | ICD-10-CM

## 2020-10-18 MED ORDER — METOPROLOL SUCCINATE ER 50 MG PO TB24: 50.0000 mg | ORAL_TABLET | Freq: Every day | ORAL | 3 refills | Status: DC

## 2020-10-18 MED ORDER — AMIODARONE HCL 200 MG PO TABS: 200.0000 mg | ORAL_TABLET | Freq: Every day | ORAL | 2 refills | Status: DC

## 2020-10-18 NOTE — Addendum Note (Signed)
Addended by: Leonides Schanz C on: 10/18/2020 10:59 AM   Modules accepted: Orders

## 2020-10-18 NOTE — Patient Instructions (Signed)
Medication Instructions:  Your physician has recommended you make the following change in your medication:  START Amiodarone 200 mg tow times daily for FOUR WEEKS, then decrease to 200 mg daily STOP ASPIRIN  INCREASE Toprol XL to 50 mg daily   *If you need a refill on your cardiac medications before your next appointment, please call your pharmacy*   Lab Work: None If you have labs (blood work) drawn today and your tests are completely normal, you will receive your results only by: Marland Kitchen MyChart Message (if you have MyChart) OR . A paper copy in the mail If you have any lab test that is abnormal or we need to change your treatment, we will call you to review the results.   Testing/Procedures: None   Follow-Up: At [facility], you and your health needs are our priority.  As part of our continuing mission to provide you with exceptional heart care, we have created designated Provider Care Teams.  These Care Teams include your primary Cardiologist (physician) and Advanced Practice Providers (APPs -  Physician Assistants and Nurse Practitioners) who all work together to provide you with the care you need, when you need it.  We recommend signing up for the patient portal called "MyChart".  Sign up information is provided on this After Visit Summary.  MyChart is used to connect with patients for Virtual Visits (Telemedicine).  Patients are able to view lab/test results, encounter notes, upcoming appointments, etc.  Non-urgent messages can be sent to your provider as well.   To learn more about what you can do with MyChart, go to [email or web address].    Your next appointment:   3 month(s)  The format for your next appointment:   In Person  Provider:   Dina Rich, MD   Other Instructions

## 2020-10-18 NOTE — Progress Notes (Signed)
   Clinical Summary Donald [name] is a 34 y.o.male seen today for follow up of the following medical problems.  1. Mitral regurgitation - admit 05/2018 with acute respiratory failure. From notes had cardaic arrest with induction for intubation.  - TTE done showed severe MR - TEE showed severe prolapse and possible flail A3 scallop with severe MR.   - 06/07/2018 went to OR for MV repair with resection of ruptured anterior papillary muscle,reconstruction of papillary chord with two Neo-chords and placement of Simplici-T Annuloplasty ring via superior septal approach - Intraop/Postop TEE reports no MR initially after surgery   07/2018 TEE during repeat admission mobile echodensity posterior leaflet, possible surgical suture, vs torn chordae vs vegetation. The MR is not reported regardign severity, ERO 0.93 would suggest severe, unclear  - CT surgery at that time recommended continued medical therapy, reconsider repeat intervention at ouptatient followup - thought perhaps an autoimmune disease may have causes some myxomatous degeneration. Undergoing rheum evaluation   - 04/2020 echo LVEF 50%, severe BAE, MV/AV VTI is 3 suggesting possible severe MR.   05/2020 TEE: Severe MR, LVEF 40-45%, mod to severe RV dysfunction - referred to Dr Donald [name], considering MV replacement          2.Chronic systolic HF - readmitted 07/2018 with volume overload - multiple TTE's and TEEs as reported below. Last LVEF 35-40% by TTE in 07/2018,LVIDd 5.6 and LVIDs 4.3, mild LAE.  - discharged on Lasix 40mg daily, lisinopril 5mg, TOprol 25mg, ASA 81, atorva 10,    - recent issues with fluid overload over the last few months. Partly due to some missed lasix doses. Had an ER visit with pulm edema, did not require admission 04/2020 echo LVEF 50%, severe BAE, MV/AV VTI is 3 suggesting possible severe MR.    ER visit 04/2020 with volume overload, managed in ER with diuresis.  - admit 05/18/20  with volume overload, diuresed and dischaged. ProBNP was 7000. K 3.3 Cr 1. CXR with pulm edema - presented with abdominal distension, SOB.Had been compliant lasix 40mg bid.    08/2020 LHC/RHC Normal coronaries, LVEF 30-35%. CI 2.48 Mean PA 32 PCWP17 LVEDP 8 - followed in CHF clinic. Recently changed to entresto   - no recent edema - no SOB/DOE.   3. Automimmunedisease - [facility] - [facility] signs of bilateral SI joint scerlosis, possibly fistulous tract to rectum Concern for possible seronegative spondyloarhtritis and or IBD.  4. Left coronary cusp density - chronic byechoimaging.    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 Atrial flutter - several episodes in setting of severe LAE - last DC-CV in 08/24/19.  - was to start amio after last CHF clinic appt but appears has not started - no recent palpitations - compliant with eliquis   7. Preop dental surgery - requiring dental surgery - will require amoxicllin 2g x 1 day of procedure given prior anuloplastic ring, hold eliuquis 2 days prior and resume day after.     SH: mother is Donald [name], also a patient of mine  Has a sone Donald [name] 1.33 yo.     Past Medical History:  Diagnosis Date  . Anemia   . Autoimmune disorder (HCC)    pyoderma gangrenosum  . Chronic systolic heart failure (HCC)    a. EF 35-40% by echo in 07/2018 b. EF at 45% by repeat echo in 04/2020  . DVT (deep venous   thrombosis) (HCC)    h/o  . Mitral regurgitation    a. s/p MV repair with resection of ruptured anterior papillary muscle and reconstruction of papillary chord and placement of annuloplasty ringin 2019. b. 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  . Mitral stenosis   . Myocardial infarction (HCC)   . Paroxysmal atrial flutter (HCC)   . Pyoderma gangrenosa   . Seronegative spondylitis (HCC)    arthritis  . Tricuspid regurgitation       No Known Allergies   Current Outpatient Medications  Medication Sig Dispense Refill  . apixaban (ELIQUIS) 5 MG TABS tablet Take 5 mg by mouth 2 (two) times daily.    . ASPIRIN ADULT LOW STRENGTH 81 MG EC tablet Take 81 mg by mouth daily.     . ferrous sulfate 325 (65 FE) MG tablet Take 1 tablet (325 mg total) by mouth daily. 90 tablet 1  . furosemide (LASIX) 40 MG tablet Take 1 tablet (40 mg total) by mouth 2 (two) times daily. 180 tablet 3  . metoprolol succinate (TOPROL-XL) 25 MG 24 hr tablet Take 1 tablet (25 mg total) by mouth daily. 90 tablet 2  . potassium chloride SA (KLOR-CON) 20 MEQ tablet Take 1 tablet (20 mEq total) by mouth 2 (two) times daily. 60 tablet 0  . sacubitril-valsartan (ENTRESTO) 24-26 MG Take 1 tablet by mouth 2 (two) times daily. 60 tablet 3   No current facility-administered medications for this visit.     Past Surgical History:  Procedure Laterality Date  . HERNIA REPAIR Right 2018  . MITRAL VALVE REPAIR  06/07/2018   [facility] - Dr Donald [name]  . RIGHT/LEFT HEART CATH AND CORONARY ANGIOGRAPHY N/A 09/11/2020   Procedure: RIGHT/LEFT HEART CATH AND CORONARY ANGIOGRAPHY;  Surgeon: [name], Donald R, MD;  Location: [facility];  Service: Cardiovascular;  Laterality: N/A;  . TEE WITHOUT CARDIOVERSION N/A 05/23/2020   Procedure: TRANSESOPHAGEAL ECHOCARDIOGRAM (TEE) WITH PROPOFOL;  Surgeon: Donald Meritt F, MD;  Location: [facility];  Service: Endoscopy;  Laterality: N/A;     No Known Allergies    Family History  Problem Relation Age of Onset  . Multiple sclerosis Mother   . Psoriasis Mother   . Depression Father   . Diabetes Father   . Diabetes Paternal Grandmother      Social History [name] reports that he has quit smoking. He has never used smokeless tobacco. [name] reports no history of alcohol use.   Review of Systems CONSTITUTIONAL: No weight loss, fever, chills, weakness or fatigue.  HEENT:  Eyes: No visual loss, blurred vision, double vision or yellow sclerae.No hearing loss, sneezing, congestion, runny nose or sore throat.  SKIN: No rash or itching.  CARDIOVASCULAR: per hpi RESPIRATORY: No shortness of breath, cough or sputum.  GASTROINTESTINAL: No anorexia, nausea, vomiting or diarrhea. No abdominal pain or blood.  GENITOURINARY: No burning on urination, no polyuria NEUROLOGICAL: No headache, dizziness, syncope, paralysis, ataxia, numbness or tingling in the extremities. No change in bowel or bladder control.  MUSCULOSKELETAL: No muscle, back pain, joint pain or stiffness.  LYMPHATICS: No enlarged nodes. No history of splenectomy.  PSYCHIATRIC: No history of depression or anxiety.  ENDOCRINOLOGIC: No reports of sweating, cold or heat intolerance. No polyuria or polydipsia.  .   Physical Examination Today's Vitals   10/18/20 1026  BP: (!) 142/76  Pulse: 71  SpO2: 98%  Weight: 217 lb (98.4 kg)  Height: 5' 9" (1.753 m)   Body mass index is   32.05 kg/m.  Gen: resting comfortably, no acute distress HEENT: no scleral icterus, pupils equal round and reactive, no palptable cervical adenopathy,  CV: RRR, 3/6 systolic murmur apex, no jvd Resp: Clear to auscultation bilaterally GI: abdomen is soft, non-tender, non-distended, normal bowel sounds, no hepatosplenomegaly MSK: extremities are warm, no edema.  Skin: warm, no rash Neuro:  no focal deficits Psych: appropriate affect   Diagnostic Studies  06/06/18 Echo [facility] SUMMARY Mild left ventricular hypertrophy The left ventricle is mildly dilated. The apex is contracting normally. The rest of LV wall is hypokinetic. LV ejection fraction = 35-40%.  Left ventricular systolic function is moderately reduced. The right ventricle is normal in size and function. There is severe mitral regurgitation. There is mild mitral valve thickening. Suspect anterior mitral valve prolapse. Recommend TEE for further evaluation. Normal  IVC size with decreased respiratory collapse. There is no pericardial effusion. There is no comparison study available.   06/06/2018 TEE [facility] SUMMARY There is severe prolapse (possible flail) of A3 scallop resulting in severe,  posteriorly directed regurgitation with a 0.5 cm gap in closure. Pulmonary  vein flow shows systolic reversal. Papillary muscles appear intact. No  vegetations seen. The left ventricular size is normal.  Left ventricular systolic function is mildly reduced. The right ventricle is normal in size and function. Diffuse thickening of the aortic valve with preserved cusp opening. There is  an extremely small ( <2 mm) echodensity on the ventricular side of the aortic  valve--no significant aortic regurgitation; recommend clinical correlation. There is mild tricuspid regurgitation. No overt vegetations identified. 3D Echocardiography was performed and reviewed for assessment of cardiac  structure and function using a workstation. Findings were integrated into the  echo report.  06/07/18 Intraop TEE Pre-Intervention Summary An Omniplane TEE probe was inserted orally. A full TEE exam was performed  including 2D, M-Mode, Color and Doppler. The TEE probe was placed  atraumatically on the first attempt. The left ventricle is severely dilated. There is normal left ventricular wall  thickness. Left ventricular systolic function is moderately reduced. The EF  is 30-35%. There is mild to moderate global hypokinesis of the left  ventricle. There is moderate to severe inferior wall hypokinesis. The right ventricle is moderate to severely dilated. The right ventricular  systolic function is mildly reduced. The interatrial septum is intact with no evidence for an atrial septal  defect. The left atrium is severely dilated. Right atrial size is normal. There is a ruptured chordae with flail of the A3 segment of the anterior  mitral leaflet. No significant mitral valve  stenosis. There is severe mitral  regurgitation. Flow reversal noted in pulmonary veins consistent with  significant mitral regurgitation. The mitral regurgitant jet is posteriorly  directed, which is consistent with anterior leaflet pathology. The tricuspid valve is normal in structure and function. There is trace  tricuspid regurgitation. The aortic valve is trileaflet. The aortic valve opens well. There is a small  irregularity on the left coronary cusp adjacent to the right coronary cusp.  Cannot exclude aortic valvular vegetation. No hemodynamically significant  valvular aortic stenosis. Trace to mild AI with central jet. The pulmonic valve is normal in structure and function. > The aortic root is normal size. No obvious dissection could be visualized.  There is no pericardial effusion. There is no pleural effusion.  Post-Intervention Summary The EF remains 30-35%. There is moderate to severe inferior wall hypokinesis. The right ventricular systolic function is normal. The interatrial septum is intact with no   evidence for an atrial septal defect. There is no mitral regurgitation noted. An annuloplasty ring is noted in the  mitral position. There is trace tricuspid regurgitation. Trace to mild AI. The pulmonic valve is not well visualized. No obvious dissection could be visualized. There is no pericardial effusion.  There is no pleural effusion. MMode/2D Measurements &Calculations EDV(MOD-sp4): 72.3 ml ESV(MOD-sp4): 117.0 ml EDV(MOD-sp2): 257.0 ml ESV(MOD-sp2): 150.0 ml SV(MOD-sp4): -44.7 ml Pre-Intervention Doppler Measurements and Calculations MV max PG: 11.1 mmHg MV mean PG: 4.8 mmHg Ao V2 max: 87.0 cm/sec Ao max PG: 3.0 mmHg Ao V2 mean: 51.9 cm/sec Ao mean PG: 1.4 mmHg Ao V2 VTI: 13.4 cm   Overall TEE Interpretation Summary Pre-intervention exam: The LV is severey dilated with mild to moderate global  hypokinesis. The EF is 30-35%. There is moderate to severe  hypokinesis of the  inferior wall. The RV systolic function is mildly reduced. There is a  ruptured chordae and flail of the A3 segment of the anterior mitral leaflet.  The posterior leaflet appears normal. Severe MR with posteriorly directed jet  and systolic flow reversal in the pulmonary veins. There is a small echodense  irregularity on the left coronary cusp of the aortic valve adjacent to the  right coronary cusp. Vegetation cannot be ruled out. There is trace to mild  AI with a central jet. There is trace TR. There is no obvious aortic  dissection. There is no pericardial effusion. There is no pleural effusion. Post-intervention exam: The LV EF remains unchanged from the pre-intervention  exam. The RV systolic function has improved and is now normal. There is an  annuloplasty ring in the mitral position. There is no MR. Otherwise the exam  is unchanged from the pre-intervention exam. At the conclusion of the  procedure the probe was removed. There were no apparant complications related  to the TEE examination.   Jun 18 2018 echo SUMMARY There is normal left ventricular wall thickness. The left ventricle is mildly dilated.  Left ventricular systolic function is mildly reduced. LV ejection fraction = 40-45%. Abnormal (paradoxical) septal motion consistent with post-operative status. There is hypokinesis of the basal to mid inferior wall. The right ventricle is normal in size and function. The left atrial size is normal. An annuloplasty ring is noted in the mitral position. The mean gradient across the mitral valve is 7.6 mmHg. There is no mitral regurgitation noted. The aortic sinus is normal in size. IVC size was normal. There is no pericardial effusion. Compared to the last surface echo dated 06/06/18, the LV systolic function is  slightly improved and the patient is s/p mitral valve repair.  Jul 28 2018 TTE SUMMARY The left ventricle is mildly dilated.  Left  ventricular systolic function is moderately reduced. LV ejection fraction = 35-40%. The right ventricle is normal size. The right ventricular systolic function is mildly reduced. The left atrium is mildly dilated. There is mild aortic regurgitation. Status post mitral valve repair with annuloplasty ring There is mild to moderate mitral regurgitation. The mean gradient across the mitral valve is 9.8 mmHg. The heart rate for the mean mitral valve gradient is 92 BPM. There is mild tricuspid regurgitation. There was insufficient TR detected to calculate RV systolic pressure. Estimated right atrial pressure is 10 mmHg.. There is no pericardial effusion.   Jul 28 2018 TEE - SUMMARY s/p Mitral valve repair with resection of ruptured anterior papillary muscle,  reconstruction of papillary chord with two Neo-chords and placement of  Simplici-T   Annuloplasty ring. Noted a mobile echodensity on the posterior leaflet of the mitral valve,  which may represents surgical suture. Differential include torn mitral valve  chordae or vegetation. Compared to the post-op TEE, MR appears worse. Clinical correlation advised. There is mild aortic regurgitation. A mobile echodensity noted on the left coronary cusp of the aortic valve  which was seen in the previous TEEs. Noted mild eccentric AI.   Jan 2020 CT scan 1. Soft tissue density in the anterior mediastinum adjacent to the median sternotomy which may simply be postoperative, but cannot exclude cellulitis. No drainable fluid collection. 2. Trace hydropneumopericardium as well as small locules of air in the anterior mediastinum inferiorly, most likely postoperative in nature. 3. Significant improved aeration when compared to prior study with residual atelectasis/scarring in the right lower lobe. There is residual airspace consolidation left lower lobe consistent with resultant discoid atelectasis or pneumonia. 4. . Filling defect in the right  jugular vein extending into the right brachiocephalic vein as well as a more broad-based filling defect in the left brachiocephalic vein, consistent with thrombus. 5. Interval sternotomy and mitral valve replacement with expected postoperative changes.   06/07/18 surgery note PREOPERATIVE DIAGNOSIS: ICD-10 I34.0 Mitral valve insufficiency  POSTOPERATIVE DIAGNOSIS: same  QUALITY MEASURES: 4047F Documentation of order for prophylactic antibiotics to be given within one hour (if fluoroquinolone or vancomycin, two hours) prior to surgical incision (or start of procedure when no incision is required) 4041F Documentation of order for cefazolin or cefuroxime for antimicrobial prophylaxis  HEMODYNAMICS AND CATH: Ejection fraction: 35%  VALVE DATA: Mitral Valve: Severe Insufficiency  PRIORITY: Emergent  INCIDENCE: First cardiovascular surgery   PROCEDURE: CPT Codes: 33427 * Mitral valve repair with resection of ruptured anterior papillary muscle, reconstruction of papillary chord with two Neo-chords and placement of Simplici-T Annuloplasty ring via superior septal approach Model: 670 Serial: [id number]  CARDIOPULMONARY BYPASS TIME: 131 min  AORTIC CROSS CLAMP TIME: 92 min  DESCRIPTION OF OPERATION After induction of anesthesia, the patient was prepped and draped. Before the incision was made, time-out was observed by all members of the surgical and anesthesia teams to identify the correct patient and procedure. The heart was exposed via a full conventional sternotomy. The anatomy of the heart and great vessels was normal. On palpation the aorta was normal.   Intraoperative transesophageal echocardiography showed the left ventricular function to be hypokinetic with an ejection fraction of 35%. Exam of the cardiac valves showed severe mitral regurgitation with anterior A2/A3 leaflet prolapse.  After heparinization, cardiopulmonary bypass was instituted. Arterial perfusion was via an  arterial cannula placed in the aorta. For assisted venous return, drainage was from two cannuli placed through the right atrium into the superior and inferior vena cavi. Caval tapes were placed. The patient was cooled to a body core temperature of 36 degrees centigrade. The left ventricle was vented through the both aorta and right superior pulmonary vein.  The ascending aorta was cross-clamped and the heart was arrested with cold DelNido cardioplegia given antegrade. Once dose was adequate for the aforementioned cross clamp time. Topical cooling was used.  The mitral valve was exposed via a superior septal approach incision. The left atrium was carefully examined. There was no clot present. Retraction sutures were placed and the valve was well visualized. The valve was bicuspid and was diseased from an apparent degenerative etiology. The calcification of the leaflets was none and no calcification of the annulus. The valve was inspected and the defect found to be ruptured anterior   papillary muscle causing the A2/A3 prolapse. The anterior leaflet was thickened. There were no vegetations or stigmata of endocarditis. The valve was amenable to repair.  The ruptured papillary muscle was resected. The chord was then reconstructed utilizing two 5-0 Neo-chords. The valve was statically tested and was found to be competent without leak.  Thirteen sutures of 2-0 dacron were placed in simple mattress fashion in the annulus from trigone to trigone posteriorly. A Medtronic Simplici T band was chosen and the sutures passed through the band. The band was seated and the sutures secured via CorKnots. The valve was again statically tested and was found to have a small leak at the medial commissure. A commisuroplasty was performed utilizing 5-0 was performed. The valve then appeared to be hydrostatic.   The left ventricular vent was placed across the mitral valve. The left atrial and interatrial septal portions of the  incisions were closed with running 4-0 Prolene. An aortic vent was placed to low suction and the cross clamp released. The right atrial portion of the incision was closed with a 4-0 Prolene suture. Caval tapes were released.  The patient was rewarmed. Cardioversion occurred spontaneously. The underlying rhythm was normal sinus. Atrial and ventricular pacing was used. The patient was weaned and separated from cardiopulmonary bypass. After discontinuation of cardiopulmonary bypass the heart adequately supported the circulation. Inotropes were used upon leaving the operating room. The left ventricular function was moderatly hypokinetic. The right ventricular function was mildly hypokinetic. Post procedure intraoperative transesophageal echocardiogram demonstrated an ejection fraction of 35% and there was well-seated annuloplasty ring with no mitral regurgitation.  The bypass lines were removed and the Heparin reversed with Protamine. The pericardium was drained with one 24 Fr. Blake drain. The pleural space did not require a chest tube. The sternum was reapproximated with #7 and double stranded stainless steel wires. The linea alba was closed with #2 absorbable sutures. The pectoralis fascia was closed with #0 absorbable sutures. Subcutaneous tissues were closed with 2-0 absorbable suture. The skin was closed with 3-0 absorbable subcuticular suture. Sterile dressings were placed over the wounds.  There were no complications and sponge, instrument, and needle counts were reported as correct. The patient was transported to the Intensive Care Unit in stable condition.   05/2020 echo IMPRESSIONS    1. Left ventricular ejection fraction, by estimation, is 40 to 45%. The  left ventricle has mildly decreased function.  2. Right ventricular systolic function moderately to severely reduced.  The right ventricular size is moderately enlarged.  3. Left atrial size was severely dilated. No left atrial/left  atrial  appendage thrombus was detected. The LAA emptying velocity was 60 cm/s.  4. Right atrial size was severely dilated.  5. Difficult visualization of mitral valve and regurgitant jet. Findings  consistent with prior repair. Thickened leaflets with restricted motion.  The MR jet was not amenable to PISA. Moderate mean gradient across the  valve of 6 mmHg. In images #[id number]  there appears to be regurgitation jet through the valve but also lateral  to the anular ring. The cumulative regurgitation appears to be severe. .  The mitral valve has been repaired/replaced. Severe mitral valve  regurgitation. Moderate mitral stenosis.  6. The tricuspid valve is abnormal. Tricuspid valve regurgitation is  moderate to severe.  7. The aortic valve is tricuspid. Aortic valve regurgitation is not  visualized. No aortic stenosis is present.  8. Technically difficult study, standard views were off axis particularly  of the mitral valve.      08/2020 RHC/LHC Assessment:  1. Normal coronary arteries 2. NICM EF 30-35% 3. Severe MR with prominent v-waves in PCWP tracing 4. Mild pulmonary venous HTN with normal CO      Assessment and Plan  1. Mitral regurgitation - complex history as described above  - severe MR with biventricular failure, being considered for repeat MV surgery by Dr Donald [name] - upcoming dental evaluatoin and surgery prior to procedure    2. Chronic systolic HF - appears euvolemic, no current symptoms - we will increase toprol to 50mg daily, continue entresto. Has HF clinic appt next month for further med changes  3. Aflutter - did not start amio since visit with HF clinic, we will start 200mg bid x 4 weeks then 200mg daily - continue eliquis, can stop ASA - EKG today shows NSR  4. Preoperative evaluation - planning on dental surgery - he is currently euvolemic, no contraindication - would need to hold eliquis 2 days prior, resume day after - would need amoxicilling  2g day of surgery given prior MV anuloplasty ring       Macyn Shropshire [name]. Shirlene Andaya, M.D. 

## 2020-10-18 NOTE — H&P (View-Only) (Signed)
Clinical Summary Donald [name] is a 35 y.o.male seen today for follow up of the following medical problems.  1. Mitral regurgitation - admit 05/2018 with acute respiratory failure. From notes had cardaic arrest with induction for intubation.  - TTE done showed severe MR - TEE showed severe prolapse and possible flail A3 scallop with severe MR.   - 06/07/2018 went to OR for MV repair with resection of ruptured anterior papillary muscle,reconstruction of papillary chord with two Neo-chords and placement of Simplici-T Annuloplasty ring via superior septal approach - Intraop/Postop TEE reports no MR initially after surgery   07/2018 TEE during repeat admission mobile echodensity posterior leaflet, possible surgical suture, vs torn chordae vs vegetation. The MR is not reported regardign severity, ERO 0.93 would suggest severe, unclear  - CT surgery at that time recommended continued medical therapy, reconsider repeat intervention at ouptatient followup - thought perhaps an autoimmune disease may have causes some myxomatous degeneration. Undergoing rheum evaluation   - 04/2020 echo LVEF 50%, severe BAE, MV/AV VTI is 3 suggesting possible severe MR.   05/2020 TEE: Severe MR, LVEF 40-45%, mod to severe RV dysfunction - referred to Dr Cornelius Moras, considering MV replacement          2.Chronic systolic HF - readmitted 07/2018 with volume overload - multiple TTE's and TEEs as reported below. Last LVEF 35-40% by TTE in 07/2018,LVIDd 5.6 and LVIDs 4.3, mild LAE.  - discharged on Lasix 40mg  daily, lisinopril 5mg , TOprol 25mg , ASA 81, atorva 10,    - recent issues with fluid overload over the last few months. Partly due to some missed lasix doses. Had an ER visit with pulm edema, did not require admission 04/2020 echo LVEF 50%, severe BAE, MV/AV VTI is 3 suggesting possible severe MR.    ER visit 04/2020 with volume overload, managed in ER with diuresis.  - admit 05/18/20  with volume overload, diuresed and dischaged. ProBNP was 7000. K 3.3 Cr 1. CXR with pulm edema - presented with abdominal distension, SOB.Had been compliant lasix 40mg  bid.    08/2020 LHC/RHC Normal coronaries, LVEF 30-35%. CI 2.48 Mean PA 32 PCWP17 LVEDP 8 - followed in CHF clinic. Recently changed to entresto   - no recent edema - no SOB/DOE.   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  4. Left coronary cusp density - chronic byechoimaging.    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 Atrial flutter - several episodes in setting of severe LAE - last DC-CV in 08/24/19.  - was to start amio after last CHF clinic appt but appears has not started - no recent palpitations - compliant with eliquis   7. Preop dental surgery - requiring dental surgery - will require amoxicllin 2g x 1 day of procedure given prior anuloplastic ring, hold eliuquis 2 days prior and resume day after.     SH: mother is Donald [name], also a patient of mine  Has a sone Donald [name] 1.33 yo.     Past Medical History:  Diagnosis Date  . Anemia   . Autoimmune disorder (HCC)    pyoderma gangrenosum  . Chronic systolic heart failure (HCC)    a. EF 35-40% by echo in 07/2018 b. EF at 45% by repeat echo in 04/2020  . DVT (deep venous  thrombosis) (HCC)    h/o  . Mitral regurgitation    a. s/p MV repair with resection of ruptured anterior papillary muscle and reconstruction of papillary chord and placement of annuloplasty ringin 2019. b. 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  . Mitral stenosis   . Myocardial infarction (HCC)   . Paroxysmal atrial flutter (HCC)   . Pyoderma gangrenosa   . Seronegative spondylitis (HCC)    arthritis  . Tricuspid regurgitation       No Known Allergies   Current Outpatient Medications  Medication Sig Dispense Refill  . apixaban (ELIQUIS) 5 MG TABS tablet Take 5 mg by mouth 2 (two) times daily.    . ASPIRIN ADULT LOW STRENGTH 81 MG EC tablet Take 81 mg by mouth daily.     . ferrous sulfate 325 (65 FE) MG tablet Take 1 tablet (325 mg total) by mouth daily. 90 tablet 1  . furosemide (LASIX) 40 MG tablet Take 1 tablet (40 mg total) by mouth 2 (two) times daily. 180 tablet 3  . metoprolol succinate (TOPROL-XL) 25 MG 24 hr tablet Take 1 tablet (25 mg total) by mouth daily. 90 tablet 2  . potassium chloride SA (KLOR-CON) 20 MEQ tablet Take 1 tablet (20 mEq total) by mouth 2 (two) times daily. 60 tablet 0  . sacubitril-valsartan (ENTRESTO) 24-26 MG Take 1 tablet by mouth 2 (two) times daily. 60 tablet 3   No current facility-administered medications for this visit.     Past Surgical History:  Procedure Laterality Date  . HERNIA REPAIR Right 2018  . MITRAL VALVE REPAIR  06/07/2018   [facility] - Dr Meda Klinefelter  . RIGHT/LEFT HEART CATH AND CORONARY ANGIOGRAPHY N/A 09/11/2020   Procedure: RIGHT/LEFT HEART CATH AND CORONARY ANGIOGRAPHY;  Surgeon: Dolores Patty, MD;  Location: [facility] INVASIVE CV LAB;  Service: Cardiovascular;  Laterality: N/A;  . TEE WITHOUT CARDIOVERSION N/A 05/23/2020   Procedure: TRANSESOPHAGEAL ECHOCARDIOGRAM (TEE) WITH PROPOFOL;  Surgeon: [name], MD;  Location: [facility] ENDO SUITE;  Service: Endoscopy;  Laterality: N/A;     No Known Allergies    Family History  Problem Relation Age of Onset  . Multiple sclerosis Mother   . Psoriasis Mother   . Depression Father   . Diabetes Father   . Diabetes Paternal Grandmother      Social History Mr. Crichlow reports that he has quit smoking. He has never used smokeless tobacco. Mr. Lupe reports no history of alcohol use.   Review of Systems CONSTITUTIONAL: No weight loss, fever, chills, weakness or fatigue.  HEENT:  Eyes: No visual loss, blurred vision, double vision or yellow sclerae.No hearing loss, sneezing, congestion, runny nose or sore throat.  SKIN: No rash or itching.  CARDIOVASCULAR: per hpi RESPIRATORY: No shortness of breath, cough or sputum.  GASTROINTESTINAL: No anorexia, nausea, vomiting or diarrhea. No abdominal pain or blood.  GENITOURINARY: No burning on urination, no polyuria NEUROLOGICAL: No headache, dizziness, syncope, paralysis, ataxia, numbness or tingling in the extremities. No change in bowel or bladder control.  MUSCULOSKELETAL: No muscle, back pain, joint pain or stiffness.  LYMPHATICS: No enlarged nodes. No history of splenectomy.  PSYCHIATRIC: No history of depression or anxiety.  ENDOCRINOLOGIC: No reports of sweating, cold or heat intolerance. No polyuria or polydipsia.  Marland Kitchen   Physical Examination Today's Vitals   10/18/20 1026  BP: (!) 142/76  Pulse: 71  SpO2: 98%  Weight: 217 lb (98.4 kg)  Height: 5\' 9"  (1.753 m)   Body mass index is  32.05 kg/m.  Gen: resting comfortably, no acute distress HEENT: no scleral icterus, pupils equal round and reactive, no palptable cervical adenopathy,  CV: RRR, 3/6 systolic murmur apex, no jvd Resp: Clear to auscultation bilaterally GI: abdomen is soft, non-tender, non-distended, normal bowel sounds, no hepatosplenomegaly MSK: extremities are warm, no edema.  Skin: warm, no rash Neuro:  no focal deficits Psych: appropriate affect   Diagnostic Studies  06/06/18 Echo [facility] SUMMARY Mild left ventricular hypertrophy The left ventricle is mildly dilated. The apex is contracting normally. The rest of LV wall is hypokinetic. LV ejection fraction = 35-40%.  Left ventricular systolic function is moderately reduced. The right ventricle is normal in size and function. There is severe mitral regurgitation. There is mild mitral valve thickening. Suspect anterior mitral valve prolapse. Recommend TEE for further evaluation. Normal  IVC size with decreased respiratory collapse. There is no pericardial effusion. There is no comparison study available.   06/06/2018 TEE [facility] SUMMARY There is severe prolapse (possible flail) of A3 scallop resulting in severe,  posteriorly directed regurgitation with a 0.5 cm gap in closure. Pulmonary  vein flow shows systolic reversal. Papillary muscles appear intact. No  vegetations seen. The left ventricular size is normal.  Left ventricular systolic function is mildly reduced. The right ventricle is normal in size and function. Diffuse thickening of the aortic valve with preserved cusp opening. There is  an extremely small ( <2 mm) echodensity on the ventricular side of the aortic  valve--no significant aortic regurgitation; recommend clinical correlation. There is mild tricuspid regurgitation. No overt vegetations identified. 3D Echocardiography was performed and reviewed for assessment of cardiac  structure and function using a workstation. Findings were integrated into the  echo report.  06/07/18 Intraop TEE Pre-Intervention Summary An Omniplane TEE probe was inserted orally. A full TEE exam was performed  including 2D, M-Mode, Color and Doppler. The TEE probe was placed  atraumatically on the first attempt. The left ventricle is severely dilated. There is normal left ventricular wall  thickness. Left ventricular systolic function is moderately reduced. The EF  is 30-35%. There is mild to moderate global hypokinesis of the left  ventricle. There is moderate to severe inferior wall hypokinesis. The right ventricle is moderate to severely dilated. The right ventricular  systolic function is mildly reduced. The interatrial septum is intact with no evidence for an atrial septal  defect. The left atrium is severely dilated. Right atrial size is normal. There is a ruptured chordae with flail of the A3 segment of the anterior  mitral leaflet. No significant mitral valve  stenosis. There is severe mitral  regurgitation. Flow reversal noted in pulmonary veins consistent with  significant mitral regurgitation. The mitral regurgitant jet is posteriorly  directed, which is consistent with anterior leaflet pathology. The tricuspid valve is normal in structure and function. There is trace  tricuspid regurgitation. The aortic valve is trileaflet. The aortic valve opens well. There is a small  irregularity on the left coronary cusp adjacent to the right coronary cusp.  Cannot exclude aortic valvular vegetation. No hemodynamically significant  valvular aortic stenosis. Trace to mild AI with central jet. The pulmonic valve is normal in structure and function. > The aortic root is normal size. No obvious dissection could be visualized.  There is no pericardial effusion. There is no pleural effusion.  Post-Intervention Summary The EF remains 30-35%. There is moderate to severe inferior wall hypokinesis. The right ventricular systolic function is normal. The interatrial septum is intact with no  evidence for an atrial septal defect. There is no mitral regurgitation noted. An annuloplasty ring is noted in the  mitral position. There is trace tricuspid regurgitation. Trace to mild AI. The pulmonic valve is not well visualized. No obvious dissection could be visualized. There is no pericardial effusion.  There is no pleural effusion. MMode/2D Measurements &Calculations EDV(MOD-sp4): 72.3 ml ESV(MOD-sp4): 117.0 ml EDV(MOD-sp2): 257.0 ml ESV(MOD-sp2): 150.0 ml SV(MOD-sp4): -44.7 ml Pre-Intervention Doppler Measurements and Calculations MV max PG: 11.1 mmHg MV mean PG: 4.8 mmHg Ao V2 max: 87.0 cm/sec Ao max PG: 3.0 mmHg Ao V2 mean: 51.9 cm/sec Ao mean PG: 1.4 mmHg Ao V2 VTI: 13.4 cm   Overall TEE Interpretation Summary Pre-intervention exam: The LV is severey dilated with mild to moderate global  hypokinesis. The EF is 30-35%. There is moderate to severe  hypokinesis of the  inferior wall. The RV systolic function is mildly reduced. There is a  ruptured chordae and flail of the A3 segment of the anterior mitral leaflet.  The posterior leaflet appears normal. Severe MR with posteriorly directed jet  and systolic flow reversal in the pulmonary veins. There is a small echodense  irregularity on the left coronary cusp of the aortic valve adjacent to the  right coronary cusp. Vegetation cannot be ruled out. There is trace to mild  AI with a central jet. There is trace TR. There is no obvious aortic  dissection. There is no pericardial effusion. There is no pleural effusion. Post-intervention exam: The LV EF remains unchanged from the pre-intervention  exam. The RV systolic function has improved and is now normal. There is an  annuloplasty ring in the mitral position. There is no MR. Otherwise the exam  is unchanged from the pre-intervention exam. At the conclusion of the  procedure the probe was removed. There were no apparant complications related  to the TEE examination.   Jun 18 2018 echo SUMMARY There is normal left ventricular wall thickness. The left ventricle is mildly dilated.  Left ventricular systolic function is mildly reduced. LV ejection fraction = 40-45%. Abnormal (paradoxical) septal motion consistent with post-operative status. There is hypokinesis of the basal to mid inferior wall. The right ventricle is normal in size and function. The left atrial size is normal. An annuloplasty ring is noted in the mitral position. The mean gradient across the mitral valve is 7.6 mmHg. There is no mitral regurgitation noted. The aortic sinus is normal in size. IVC size was normal. There is no pericardial effusion. Compared to the last surface echo dated 06/06/18, the LV systolic function is  slightly improved and the patient is s/p mitral valve repair.  Jul 28 2018 TTE SUMMARY The left ventricle is mildly dilated.  Left  ventricular systolic function is moderately reduced. LV ejection fraction = 35-40%. The right ventricle is normal size. The right ventricular systolic function is mildly reduced. The left atrium is mildly dilated. There is mild aortic regurgitation. Status post mitral valve repair with annuloplasty ring There is mild to moderate mitral regurgitation. The mean gradient across the mitral valve is 9.8 mmHg. The heart rate for the mean mitral valve gradient is 92 BPM. There is mild tricuspid regurgitation. There was insufficient TR detected to calculate RV systolic pressure. Estimated right atrial pressure is 10 mmHg.Marland Kitchen There is no pericardial effusion.   Jul 28 2018 TEE - SUMMARY s/p Mitral valve repair with resection of ruptured anterior papillary muscle,  reconstruction of papillary chord with two Neo-chords and placement of  Simplici-T  Annuloplasty ring. Noted a mobile echodensity on the posterior leaflet of the mitral valve,  which may represents surgical suture. Differential include torn mitral valve  chordae or vegetation. Compared to the post-op TEE, MR appears worse. Clinical correlation advised. There is mild aortic regurgitation. A mobile echodensity noted on the left coronary cusp of the aortic valve  which was seen in the previous TEEs. Noted mild eccentric AI.   Jan 2020 CT scan 1. Soft tissue density in the anterior mediastinum adjacent to the median sternotomy which may simply be postoperative, but cannot exclude cellulitis. No drainable fluid collection. 2. Trace hydropneumopericardium as well as small locules of air in the anterior mediastinum inferiorly, most likely postoperative in nature. 3. Significant improved aeration when compared to prior study with residual atelectasis/scarring in the right lower lobe. There is residual airspace consolidation left lower lobe consistent with resultant discoid atelectasis or pneumonia. 4. . Filling defect in the right  jugular vein extending into the right brachiocephalic vein as well as a more broad-based filling defect in the left brachiocephalic vein, consistent with thrombus. 5. Interval sternotomy and mitral valve replacement with expected postoperative changes.   06/07/18 surgery note PREOPERATIVE DIAGNOSIS: ICD-10 I34.0 Mitral valve insufficiency  POSTOPERATIVE DIAGNOSIS: same  QUALITY MEASURES: 4047F Documentation of order for prophylactic antibiotics to be given within one hour (if fluoroquinolone or vancomycin, two hours) prior to surgical incision (or start of procedure when no incision is required) 4041F Documentation of order for cefazolin or cefuroxime for antimicrobial prophylaxis  HEMODYNAMICS AND CATH: Ejection fraction: 35%  VALVE DATA: Mitral Valve: Severe Insufficiency  PRIORITY: Emergent  INCIDENCE: First cardiovascular surgery   PROCEDURE: CPT Codes: [id number] * Mitral valve repair with resection of ruptured anterior papillary muscle, reconstruction of papillary chord with two Neo-chords and placement of Simplici-T Annuloplasty ring via superior septal approach Model: 670 Serial: [id number]  CARDIOPULMONARY BYPASS TIME: 131 min  AORTIC CROSS CLAMP TIME: 92 min  DESCRIPTION OF OPERATION After induction of anesthesia, the patient was prepped and draped. Before the incision was made, time-out was observed by all members of the surgical and anesthesia teams to identify the correct patient and procedure. The heart was exposed via a full conventional sternotomy. The anatomy of the heart and great vessels was normal. On palpation the aorta was normal.   Intraoperative transesophageal echocardiography showed the left ventricular function to be hypokinetic with an ejection fraction of 35%. Exam of the cardiac valves showed severe mitral regurgitation with anterior A2/A3 leaflet prolapse.  After heparinization, cardiopulmonary bypass was instituted. Arterial perfusion was via an  arterial cannula placed in the aorta. For assisted venous return, drainage was from two cannuli placed through the right atrium into the superior and inferior vena cavi. Caval tapes were placed. The patient was cooled to a body core temperature of 36 degrees centigrade. The left ventricle was vented through the both aorta and right superior pulmonary vein.  The ascending aorta was cross-clamped and the heart was arrested with cold DelNido cardioplegia given antegrade. Once dose was adequate for the aforementioned cross clamp time. Topical cooling was used.  The mitral valve was exposed via a superior septal approach incision. The left atrium was carefully examined. There was no clot present. Retraction sutures were placed and the valve was well visualized. The valve was bicuspid and was diseased from an apparent degenerative etiology. The calcification of the leaflets was none and no calcification of the annulus. The valve was inspected and the defect found to be ruptured anterior  papillary muscle causing the A2/A3 prolapse. The anterior leaflet was thickened. There were no vegetations or stigmata of endocarditis. The valve was amenable to repair.  The ruptured papillary muscle was resected. The chord was then reconstructed utilizing two 5-0 Neo-chords. The valve was statically tested and was found to be competent without leak.  Thirteen sutures of 2-0 dacron were placed in simple mattress fashion in the annulus from trigone to trigone posteriorly. A Medtronic Simplici T band was chosen and the sutures passed through the band. The band was seated and the sutures secured via CorKnots. The valve was again statically tested and was found to have a small leak at the medial commissure. A commisuroplasty was performed utilizing 5-0 was performed. The valve then appeared to be hydrostatic.   The left ventricular vent was placed across the mitral valve. The left atrial and interatrial septal portions of the  incisions were closed with running 4-0 Prolene. An aortic vent was placed to low suction and the cross clamp released. The right atrial portion of the incision was closed with a 4-0 Prolene suture. Caval tapes were released.  The patient was rewarmed. Cardioversion occurred spontaneously. The underlying rhythm was normal sinus. Atrial and ventricular pacing was used. The patient was weaned and separated from cardiopulmonary bypass. After discontinuation of cardiopulmonary bypass the heart adequately supported the circulation. Inotropes were used upon leaving the operating room. The left ventricular function was moderatly hypokinetic. The right ventricular function was mildly hypokinetic. Post procedure intraoperative transesophageal echocardiogram demonstrated an ejection fraction of 35% and there was well-seated annuloplasty ring with no mitral regurgitation.  The bypass lines were removed and the Heparin reversed with Protamine. The pericardium was drained with one 24 Fr. Blake drain. The pleural space did not require a chest tube. The sternum was reapproximated with #7 and double stranded stainless steel wires. The linea alba was closed with #2 absorbable sutures. The pectoralis fascia was closed with #0 absorbable sutures. Subcutaneous tissues were closed with 2-0 absorbable suture. The skin was closed with 3-0 absorbable subcuticular suture. Sterile dressings were placed over the wounds.  There were no complications and sponge, instrument, and needle counts were reported as correct. The patient was transported to the Intensive Care Unit in stable condition.   05/2020 echo IMPRESSIONS    1. Left ventricular ejection fraction, by estimation, is 40 to 45%. The  left ventricle has mildly decreased function.  2. Right ventricular systolic function moderately to severely reduced.  The right ventricular size is moderately enlarged.  3. Left atrial size was severely dilated. No left atrial/left  atrial  appendage thrombus was detected. The LAA emptying velocity was 60 cm/s.  4. Right atrial size was severely dilated.  5. Difficult visualization of mitral valve and regurgitant jet. Findings  consistent with prior repair. Thickened leaflets with restricted motion.  The MR jet was not amenable to PISA. Moderate mean gradient across the  valve of 6 mmHg. In images #[id number]  there appears to be regurgitation jet through the valve but also lateral  to the anular ring. The cumulative regurgitation appears to be severe. .  The mitral valve has been repaired/replaced. Severe mitral valve  regurgitation. Moderate mitral stenosis.  6. The tricuspid valve is abnormal. Tricuspid valve regurgitation is  moderate to severe.  7. The aortic valve is tricuspid. Aortic valve regurgitation is not  visualized. No aortic stenosis is present.  8. Technically difficult study, standard views were off axis particularly  of the mitral valve.  08/2020 RHC/LHC Assessment:  1. Normal coronary arteries 2. NICM EF 30-35% 3. Severe MR with prominent v-waves in PCWP tracing 4. Mild pulmonary venous HTN with normal CO      Assessment and Plan  1. Mitral regurgitation - complex history as described above  - severe MR with biventricular failure, being considered for repeat MV surgery by Dr Cornelius Moras - upcoming dental evaluatoin and surgery prior to procedure    2. Chronic systolic HF - appears euvolemic, no current symptoms - we will increase toprol to 50mg  daily, continue entresto. Has HF clinic appt next month for further med changes  3. Aflutter - did not start amio since visit with HF clinic, we will start 200mg  bid x 4 weeks then 200mg  daily - continue eliquis, can stop ASA - EKG today shows NSR  4. Preoperative evaluation - planning on dental surgery - he is currently euvolemic, no contraindication - would need to hold eliquis 2 days prior, resume day after - would need amoxicilling  2g day of surgery given prior MV anuloplasty ring       [name], M.D.

## 2020-11-01 ENCOUNTER — Encounter (HOSPITAL_COMMUNITY): Admission: RE | Payer: Self-pay | Source: Home / Self Care

## 2020-11-01 ENCOUNTER — Ambulatory Visit (HOSPITAL_COMMUNITY): Admission: RE | Admit: 2020-11-01 | Payer: Medicaid Other | Source: Home / Self Care | Admitting: Dentistry

## 2020-11-01 SURGERY — MULTIPLE EXTRACTION WITH ALVEOLOPLASTY
Anesthesia: General

## 2020-11-07 ENCOUNTER — Encounter (HOSPITAL_COMMUNITY): Payer: Self-pay | Admitting: Dentistry

## 2020-11-07 NOTE — Progress Notes (Signed)
Anesthesia follow-up: See my previous note signed on 09/19/20. Patient was initially scheduled for multiple extractions with alveoloplasty on 09/20/20 (in anticipation for redo MV surgery) but cancelled due to compliance issues and had gone out of town. Procedure now rescheduled for 11/08/20 with Dr. Alda Berthold.   Last cardiology visit with Dr. Wyline Mood on 10/18/20. He wrote,  "Preop dental surgery - requiring dental surgery - will require amoxicllin 2g x 1 day of procedure given prior anuloplastic ring, hold eliuquis 2 days prior and resume day after."  PAT RN could not reach the patient by phone but preoperative instructions given to his mother Talbert Forest.   Shonna Chock, PA-C Surgical Short Stay/Anesthesiology [facility] Phone [phone] [facility] Phone [phone] 11/07/2020 12:41 PM

## 2020-11-07 NOTE — Progress Notes (Signed)
Unable to reach patient after multiple attempts.  Mailbox is full, unable to [facility].  Spoke with his Mother Talbert Forest and gave her instructions for DOS for patient.  PCP -  Cardiologist - Dr Dina Rich  Chest x-ray - 07/19/20 (1V) EKG - 10/18/20 Stress Test - n/a ECHO - 05/23/20 Cardiac Cath - 09/11/20  Blood Thinner Instructions:  Follow your surgeon's instructions on when to stop Eliquis prior to surgery.  Anesthesia review: Yes  STOP now taking any Aspirin (unless otherwise instructed by your surgeon), Aleve, Naproxen, Ibuprofen, Motrin, Advil, Goody's, BC's, all herbal medications, fish oil, and all vitamins.   Mother Talbert Forest states patient does not have any covid symptoms.

## 2020-11-07 NOTE — Anesthesia Preprocedure Evaluation (Addendum)
Anesthesia Evaluation  Patient identified by MRN, date of birth, ID band Patient awake    Reviewed: Allergy & Precautions, NPO status , Patient's Chart, lab work & pertinent test results, reviewed documented beta blocker date and time   Airway Mallampati: III  TM Distance: >3 FB Neck ROM: Full    Dental  (+) Poor Dentition, Dental Advisory Given,    Pulmonary neg pulmonary ROS, former smoker,    Pulmonary exam normal breath sounds clear to auscultation       Cardiovascular + Past MI, +CHF and + DVT  Normal cardiovascular exam+ dysrhythmias Atrial Fibrillation + Valvular Problems/Murmurs (a. 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 b. severe, recurrent MR.) MR  Rhythm:Regular Rate:Normal  RHC/LHC 09/11/20: Findings: RA = 7 RV = 49/10 PA = 48/16 (32) PCW = 17 (v=32) Fick cardiac output/index = 5.3/2.5 PVR = 2.9 WU Ao sat = 99% PA sat = 71%, 70% High SVC sat = 75%  Assessment: 1. Normal coronary arteries 2. NICM EF 30-35% 3. Severe MR with prominent v-waves in PCWP tracing 4. Mild pulmonary venous HTN with normal CO  Plan/Discussion: F/u with Dr. Cornelius Moras for possible MV replacement.    TEE 05/23/20: IMPRESSIONS  1. Left ventricular ejection fraction, by estimation, is 40 to 45%. The  left ventricle has mildly decreased function.  2. Right ventricular systolic function moderately to severely reduced.  The right ventricular size is moderately enlarged.  3. Left atrial size was severely dilated. No left atrial/left atrial  appendage thrombus was detected. The LAA emptying velocity was 60 cm/s.  4. Right atrial size was severely dilated.  5. Difficult visualization of mitral valve and regurgitant jet. Findings  consistent with prior repair. Thickened leaflets with restricted motion.  The MR jet was not  amenable to PISA. Moderate mean gradient across the  valve of 6 mmHg. In images #[id number]  there appears to be regurgitation jet through the valve but also lateral  to the anular ring. The cumulative regurgitation appears to be severe. .  The mitral valve has been repaired/replaced. Severe mitral valve  regurgitation. Moderate mitral stenosis.  6. The tricuspid valve is abnormal. Tricuspid valve regurgitation is  moderate to severe.  7. The aortic valve is tricuspid. Aortic valve regurgitation is not  visualized. No aortic stenosis is present.  8. Technically difficult study, standard views were off axis particularly  of the mitral valve.    Neuro/Psych negative neurological ROS  negative psych ROS   GI/Hepatic negative GI ROS, Neg liver ROS,   Endo/Other  negative endocrine ROS  Renal/GU negative Renal ROS  negative genitourinary   Musculoskeletal negative musculoskeletal ROS (+)   Abdominal   Peds  Hematology  (+) Blood dyscrasia (on eliquis), ,   Anesthesia Other Findings considered for redo mitral valve surgery for recurrent severe MR with chronic systolic CHF with BiV failure and PAF  Reproductive/Obstetrics                           Anesthesia Physical Anesthesia Plan  ASA: IV  Anesthesia Plan: General   Post-op Pain Management:    Induction: Intravenous  PONV Risk Score and Plan: 2 and Midazolam, Dexamethasone and Ondansetron  Airway Management Planned: Nasal ETT  Additional Equipment:   Intra-op Plan:   Post-operative Plan: Extubation in OR  Informed Consent: I have reviewed the patients History and Physical,  chart, labs and discussed the procedure including the risks, benefits and alternatives for the proposed anesthesia with the patient or authorized representative who has indicated his/her understanding and acceptance.     Dental advisory given  Plan Discussed with: CRNA  Anesthesia Plan Comments: (See PAT note written  by Shonna Chock, PA-C. 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   Last cardiology visit with Dr. Wyline Mood on 10/18/20. He wrote,  "Preop dental surgery - requiring dental surgery - will require amoxicllin 2g x 1 day of procedure given prior anuloplastic ring, hold eliuquis 2 days prior and resume day after."  )       Anesthesia Quick Evaluation

## 2020-11-08 ENCOUNTER — Encounter (HOSPITAL_COMMUNITY): Payer: Self-pay | Admitting: Dentistry

## 2020-11-08 ENCOUNTER — Encounter (HOSPITAL_COMMUNITY)
Admission: RE | Disposition: A | Discharge status: Home / Self Care | Payer: Self-pay | Source: Home / Self Care | Attending: Dentistry

## 2020-11-08 ENCOUNTER — Other Ambulatory Visit: Payer: Self-pay

## 2020-11-08 ENCOUNTER — Ambulatory Visit (HOSPITAL_COMMUNITY): Payer: Self-pay | Admitting: Vascular Surgery

## 2020-11-08 ENCOUNTER — Ambulatory Visit (HOSPITAL_COMMUNITY)
Admission: RE | Admit: 2020-11-08 | Discharge: 2020-11-08 | Disposition: A | Discharge status: Home / Self Care | Payer: Self-pay | Attending: Dentistry | Admitting: Dentistry

## 2020-11-08 DIAGNOSIS — K083 Retained dental root: Secondary | ICD-10-CM

## 2020-11-08 DIAGNOSIS — Z952 Presence of prosthetic heart valve: Secondary | ICD-10-CM | POA: Insufficient documentation

## 2020-11-08 DIAGNOSIS — I5022 Chronic systolic (congestive) heart failure: Secondary | ICD-10-CM | POA: Insufficient documentation

## 2020-11-08 DIAGNOSIS — Z7901 Long term (current) use of anticoagulants: Secondary | ICD-10-CM | POA: Insufficient documentation

## 2020-11-08 DIAGNOSIS — Z7982 Long term (current) use of aspirin: Secondary | ICD-10-CM | POA: Insufficient documentation

## 2020-11-08 DIAGNOSIS — I4892 Unspecified atrial flutter: Secondary | ICD-10-CM | POA: Insufficient documentation

## 2020-11-08 DIAGNOSIS — K0530 Chronic periodontitis, unspecified: Secondary | ICD-10-CM

## 2020-11-08 DIAGNOSIS — I110 Hypertensive heart disease with heart failure: Secondary | ICD-10-CM | POA: Insufficient documentation

## 2020-11-08 DIAGNOSIS — K029 Dental caries, unspecified: Secondary | ICD-10-CM

## 2020-11-08 DIAGNOSIS — Z87891 Personal history of nicotine dependence: Secondary | ICD-10-CM | POA: Insufficient documentation

## 2020-11-08 DIAGNOSIS — Z79899 Other long term (current) drug therapy: Secondary | ICD-10-CM | POA: Insufficient documentation

## 2020-11-08 DIAGNOSIS — I081 Rheumatic disorders of both mitral and tricuspid valves: Secondary | ICD-10-CM | POA: Insufficient documentation

## 2020-11-08 HISTORY — PX: MULTIPLE EXTRACTIONS WITH ALVEOLOPLASTY: SHX5342

## 2020-11-08 HISTORY — DX: Cardiac arrhythmia, unspecified: I49.9

## 2020-11-08 HISTORY — DX: Heart failure, unspecified: I50.9

## 2020-11-08 LAB — CBC
HCT: 40.8 % (ref 39.0–52.0)
Hemoglobin: 13.3 g/dL (ref 13.0–17.0)
MCH: 28.7 pg (ref 26.0–34.0)
MCHC: 32.6 g/dL (ref 30.0–36.0)
MCV: 88.1 fL (ref 80.0–100.0)
Platelets: 196 10*3/uL (ref 150–400)
RBC: 4.63 MIL/uL (ref 4.22–5.81)
RDW: 17.2 % — ABNORMAL HIGH (ref 11.5–15.5)
WBC: 8.8 10*3/uL (ref 4.0–10.5)
nRBC: 0 % (ref 0.0–0.2)

## 2020-11-08 LAB — BASIC METABOLIC PANEL
Anion gap: 6 (ref 5–15)
BUN: 11 mg/dL (ref 6–20)
CO2: 23 mmol/L (ref 22–32)
Calcium: 8.7 mg/dL — ABNORMAL LOW (ref 8.9–10.3)
Chloride: 106 mmol/L (ref 98–111)
Creatinine, Ser: 0.76 mg/dL (ref 0.61–1.24)
GFR, Estimated: >60 mL/min (ref >60)
Glucose, Bld: 140 mg/dL — ABNORMAL HIGH (ref 70–99)
Potassium: 4.6 mmol/L (ref 3.5–5.1)
Sodium: 135 mmol/L (ref 135–145)

## 2020-11-08 SURGERY — MULTIPLE EXTRACTION WITH ALVEOLOPLASTY
Anesthesia: General | Site: Mouth

## 2020-11-08 MED ORDER — ROCURONIUM BROMIDE 10 MG/ML (PF) SYRINGE
PREFILLED_SYRINGE | INTRAVENOUS | Status: DC | PRN
  Administered 2020-11-08: 50 mg via INTRAVENOUS

## 2020-11-08 MED ORDER — DEXAMETHASONE SODIUM PHOSPHATE 10 MG/ML IJ SOLN
INTRAMUSCULAR | Status: DC | PRN
  Administered 2020-11-08: 5 mg via INTRAVENOUS

## 2020-11-08 MED ORDER — METOPROLOL SUCCINATE ER 50 MG PO TB24
50.0000 mg | ORAL_TABLET | Freq: Once | ORAL | Status: AC
  Administered 2020-11-08: 50 mg via ORAL
  Filled 2020-11-08: qty 1

## 2020-11-08 MED ORDER — LABETALOL HCL 5 MG/ML IV SOLN
INTRAVENOUS | Status: AC
  Filled 2020-11-08: qty 4

## 2020-11-08 MED ORDER — ONDANSETRON HCL 4 MG/2ML IJ SOLN
INTRAMUSCULAR | Status: AC
  Filled 2020-11-08: qty 2

## 2020-11-08 MED ORDER — SUGAMMADEX SODIUM 200 MG/2ML IV SOLN
INTRAVENOUS | Status: DC | PRN
  Administered 2020-11-08: 200 mg via INTRAVENOUS

## 2020-11-08 MED ORDER — LIDOCAINE 2% (20 MG/ML) 5 ML SYRINGE
INTRAMUSCULAR | Status: AC
  Filled 2020-11-08: qty 5

## 2020-11-08 MED ORDER — OXYMETAZOLINE HCL 0.05 % NA SOLN
NASAL | Status: AC
  Filled 2020-11-08: qty 30

## 2020-11-08 MED ORDER — FENTANYL CITRATE (PF) 250 MCG/5ML IJ SOLN
INTRAMUSCULAR | Status: AC
  Filled 2020-11-08: qty 5

## 2020-11-08 MED ORDER — LACTATED RINGERS IV SOLN: INTRAVENOUS | Status: DC

## 2020-11-08 MED ORDER — FENTANYL CITRATE (PF) 250 MCG/5ML IJ SOLN
INTRAMUSCULAR | Status: DC | PRN
  Administered 2020-11-08 (×2): 50 ug via INTRAVENOUS

## 2020-11-08 MED ORDER — ACETAMINOPHEN 500 MG PO TABS
1000.0000 mg | ORAL_TABLET | Freq: Once | ORAL | Status: AC
  Administered 2020-11-08: 1000 mg via ORAL
  Filled 2020-11-08: qty 2

## 2020-11-08 MED ORDER — SUCCINYLCHOLINE CHLORIDE 200 MG/10ML IV SOSY
PREFILLED_SYRINGE | INTRAVENOUS | Status: DC | PRN
  Administered 2020-11-08: 100 mg via INTRAVENOUS

## 2020-11-08 MED ORDER — PROPOFOL 10 MG/ML IV BOLUS
INTRAVENOUS | Status: DC | PRN
  Administered 2020-11-08: 100 mg via INTRAVENOUS
  Administered 2020-11-08 (×2): 20 mg via INTRAVENOUS

## 2020-11-08 MED ORDER — FENTANYL CITRATE (PF) 100 MCG/2ML IJ SOLN
INTRAMUSCULAR | Status: AC
  Filled 2020-11-08: qty 2

## 2020-11-08 MED ORDER — ORAL CARE MOUTH RINSE: 15.0000 mL | Freq: Once | OROMUCOSAL | Status: AC

## 2020-11-08 MED ORDER — FENTANYL CITRATE (PF) 100 MCG/2ML IJ SOLN
25.0000 ug | INTRAMUSCULAR | Status: DC | PRN
  Administered 2020-11-08: 50 ug via INTRAVENOUS
  Administered 2020-11-08: 25 ug via INTRAVENOUS

## 2020-11-08 MED ORDER — LIDOCAINE-EPINEPHRINE 2 %-1:100000 IJ SOLN
INTRAMUSCULAR | Status: DC | PRN
  Administered 2020-11-08: 5.1 mL via INTRADERMAL

## 2020-11-08 MED ORDER — HYDROCODONE-ACETAMINOPHEN 5-325 MG PO TABS: 1.0000 | ORAL_TABLET | Freq: Four times a day (QID) | ORAL | 0 refills | Status: AC | PRN

## 2020-11-08 MED ORDER — ONDANSETRON HCL 4 MG/2ML IJ SOLN
INTRAMUSCULAR | Status: DC | PRN
  Administered 2020-11-08: 4 mg via INTRAVENOUS

## 2020-11-08 MED ORDER — HEMOSTATIC AGENTS (NO CHARGE) OPTIME
TOPICAL | Status: DC | PRN
  Administered 2020-11-08: 1 via TOPICAL

## 2020-11-08 MED ORDER — CHLORHEXIDINE GLUCONATE 0.12 % MT SOLN
15.0000 mL | Freq: Once | OROMUCOSAL | Status: AC
  Administered 2020-11-08: 15 mL via OROMUCOSAL
  Filled 2020-11-08: qty 15

## 2020-11-08 MED ORDER — CEFAZOLIN SODIUM-DEXTROSE 2-4 GM/100ML-% IV SOLN
2.0000 g | INTRAVENOUS | Status: AC
  Administered 2020-11-08: 2 g via INTRAVENOUS
  Filled 2020-11-08: qty 100

## 2020-11-08 MED ORDER — 0.9 % SODIUM CHLORIDE (POUR BTL) OPTIME
TOPICAL | Status: DC | PRN
  Administered 2020-11-08: 1000 mL

## 2020-11-08 MED ORDER — EPHEDRINE 5 MG/ML INJ
INTRAVENOUS | Status: AC
  Filled 2020-11-08: qty 10

## 2020-11-08 MED ORDER — METOPROLOL SUCCINATE ER 25 MG PO TB24
ORAL_TABLET | ORAL | Status: AC
  Filled 2020-11-08: qty 2

## 2020-11-08 MED ORDER — BUPIVACAINE-EPINEPHRINE (PF) 0.5% -1:200000 IJ SOLN
INTRAMUSCULAR | Status: AC
  Filled 2020-11-08: qty 3.6

## 2020-11-08 MED ORDER — DEXAMETHASONE SODIUM PHOSPHATE 10 MG/ML IJ SOLN
INTRAMUSCULAR | Status: AC
  Filled 2020-11-08: qty 1

## 2020-11-08 MED ORDER — MIDAZOLAM HCL 2 MG/2ML IJ SOLN
INTRAMUSCULAR | Status: AC
  Filled 2020-11-08: qty 2

## 2020-11-08 MED ORDER — ROCURONIUM BROMIDE 10 MG/ML (PF) SYRINGE
PREFILLED_SYRINGE | INTRAVENOUS | Status: AC
  Filled 2020-11-08: qty 10

## 2020-11-08 MED ORDER — ACETAMINOPHEN 10 MG/ML IV SOLN
INTRAVENOUS | Status: AC
  Filled 2020-11-08: qty 100

## 2020-11-08 MED ORDER — EPHEDRINE SULFATE 50 MG/ML IJ SOLN
INTRAMUSCULAR | Status: DC | PRN
  Administered 2020-11-08 (×3): 5 mg via INTRAVENOUS

## 2020-11-08 MED ORDER — LIDOCAINE-EPINEPHRINE 2 %-1:100000 IJ SOLN
INTRAMUSCULAR | Status: AC
  Filled 2020-11-08: qty 10.2

## 2020-11-08 MED ORDER — PROPOFOL 10 MG/ML IV BOLUS
INTRAVENOUS | Status: AC
  Filled 2020-11-08: qty 20

## 2020-11-08 MED ORDER — MIDAZOLAM HCL 2 MG/2ML IJ SOLN
INTRAMUSCULAR | Status: DC | PRN
  Administered 2020-11-08: 2 mg via INTRAVENOUS

## 2020-11-08 MED ORDER — LIDOCAINE 2% (20 MG/ML) 5 ML SYRINGE
INTRAMUSCULAR | Status: DC | PRN
  Administered 2020-11-08: 60 mg via INTRAVENOUS

## 2020-11-08 MED ORDER — LABETALOL HCL 5 MG/ML IV SOLN
10.0000 mg | Freq: Once | INTRAVENOUS | Status: AC
  Administered 2020-11-08: 10 mg via INTRAVENOUS

## 2020-11-08 MED ORDER — SUCCINYLCHOLINE CHLORIDE 200 MG/10ML IV SOSY
PREFILLED_SYRINGE | INTRAVENOUS | Status: AC
  Filled 2020-11-08: qty 10

## 2020-11-08 MED ORDER — CEFAZOLIN SODIUM-DEXTROSE 2-4 GM/100ML-% IV SOLN: 2.0000 g | INTRAVENOUS | Status: DC

## 2020-11-08 SURGICAL SUPPLY — 33 items
ALCOHOL 70% 16 OZ (MISCELLANEOUS) ×2 IMPLANT
BLADE SURG 15 STRL LF DISP TIS (BLADE) ×1 IMPLANT
BLADE SURG 15 STRL SS (BLADE) ×1
COVER SURGICAL LIGHT HANDLE (MISCELLANEOUS) ×2 IMPLANT
COVER WAND RF STERILE (DRAPES) ×2 IMPLANT
GAUZE 4X4 16PLY RFD (DISPOSABLE) ×2 IMPLANT
GAUZE PACKING FOLDED 2  STR (GAUZE/BANDAGES/DRESSINGS) ×1
GAUZE PACKING FOLDED 2 STR (GAUZE/BANDAGES/DRESSINGS) ×1 IMPLANT
GLOVE SURG ENC MOIS LTX SZ6.5 (GLOVE) ×2 IMPLANT
GLOVE SURG POLYISO LF SZ6 (GLOVE) ×2 IMPLANT
GOWN STRL REUS W/ TWL LRG LVL3 (GOWN DISPOSABLE) ×2 IMPLANT
GOWN STRL REUS W/TWL LRG LVL3 (GOWN DISPOSABLE) ×2
KIT BASIN OR (CUSTOM PROCEDURE TRAY) ×2 IMPLANT
KIT TURNOVER KIT B (KITS) ×2 IMPLANT
MANIFOLD NEPTUNE II (INSTRUMENTS) ×2 IMPLANT
NEEDLE BLUNT 16X1.5 OR ONLY (NEEDLE) ×2 IMPLANT
NS IRRIG 1000ML POUR BTL (IV SOLUTION) ×2 IMPLANT
PACK EENT II TURBAN DRAPE (CUSTOM PROCEDURE TRAY) ×2 IMPLANT
PAD ARMBOARD 7.5X6 YLW CONV (MISCELLANEOUS) ×2 IMPLANT
SPONGE SURGIFOAM ABS GEL 100 (HEMOSTASIS) IMPLANT
SPONGE SURGIFOAM ABS GEL 12-7 (HEMOSTASIS) IMPLANT
SPONGE SURGIFOAM ABS GEL SZ50 (HEMOSTASIS) ×2 IMPLANT
SUCTION FRAZIER HANDLE 10FR (MISCELLANEOUS) ×1
SUCTION TUBE FRAZIER 10FR DISP (MISCELLANEOUS) ×1 IMPLANT
SUT CHROMIC 3 0 PS 2 (SUTURE) ×4 IMPLANT
SUT CHROMIC 4 0 P 3 18 (SUTURE) IMPLANT
SYR 50ML SLIP (SYRINGE) ×2 IMPLANT
SYR BULB IRRIG 60ML STRL (SYRINGE) ×2 IMPLANT
TOWEL GREEN STERILE FF (TOWEL DISPOSABLE) ×2 IMPLANT
TUBE CONNECTING 12X1/4 (SUCTIONS) ×2 IMPLANT
WATER STERILE IRR 1000ML POUR (IV SOLUTION) ×2 IMPLANT
WATER TABLETS ICX (MISCELLANEOUS) ×2 IMPLANT
YANKAUER SUCT BULB TIP NO VENT (SUCTIONS) ×2 IMPLANT

## 2020-11-08 NOTE — Discharge Instructions (Signed)
General Anesthesia, Adult, Care After This sheet gives you information about how to care for yourself after your procedure. Your health care provider may also give you more specific instructions. If you have problems or questions, contact your health care provider. What can I expect after the procedure? After the procedure, the following side effects are common:  Pain or discomfort at the IV site.  Nausea.  Vomiting.  Sore throat.  Trouble concentrating.  Feeling cold or chills.  Feeling weak or tired.  Sleepiness and fatigue.  Soreness and body aches. These side effects can affect parts of the body that were not involved in surgery. Follow these instructions at home: For the time period you were told by your health care provider:  Rest.  Do not participate in activities where you could fall or become injured.  Do not drive or use machinery.  Do not drink alcohol.  Do not take sleeping pills or medicines that cause drowsiness.  Do not make important decisions or sign legal documents.  Do not take care of children on your own.   Eating and drinking  Follow any instructions from your health care provider about eating or drinking restrictions.  When you feel hungry, start by eating small amounts of foods that are soft and easy to digest (bland), such as toast. Gradually return to your regular diet.  Drink enough fluid to keep your urine pale yellow.  If you vomit, rehydrate by drinking water, juice, or clear broth. General instructions  If you have sleep apnea, surgery and certain medicines can increase your risk for breathing problems. Follow instructions from your health care provider about wearing your sleep device: ? Anytime you are sleeping, including during daytime naps. ? While taking prescription pain medicines, sleeping medicines, or medicines that make you drowsy.  Have a responsible adult stay with you for the time you are told. It is important to have  someone help care for you until you are awake and alert.  Return to your normal activities as told by your health care provider. Ask your health care provider what activities are safe for you.  Take over-the-counter and prescription medicines only as told by your health care provider.  If you smoke, do not smoke without supervision.  Keep all follow-up visits as told by your health care provider. This is important. Contact a health care provider if:  You have nausea or vomiting that does not get better with medicine.  You cannot eat or drink without vomiting.  You have pain that does not get better with medicine.  You are unable to pass urine.  You develop a skin rash.  You have a fever.  You have redness around your IV site that gets worse. Get help right away if:  You have difficulty breathing.  You have chest pain.  You have blood in your urine or stool, or you vomit blood. Summary  After the procedure, it is common to have a sore throat or nausea. It is also common to feel tired.  Have a responsible adult stay with you for the time you are told. It is important to have someone help care for you until you are awake and alert.  When you feel hungry, start by eating small amounts of foods that are soft and easy to digest (bland), such as toast. Gradually return to your regular diet.  Drink enough fluid to keep your urine pale yellow.  Return to your normal activities as told by your health care provider.   Ask your health care provider what activities are safe for you. This information is not intended to replace advice given to you by your health care provider. Make sure you discuss any questions you have with your health care provider. Document Revised: 02/16/2020 Document Reviewed: 09/15/2019 Elsevier Patient Education  2021 [employer] Department of Dental Medicine [place]. Chales Salmon, D.M.D. Phone: [phone] Fax: [phone]    MOUTH CARE  AFTER SURGERY   FACTS:  Ice used in ice bag helps keep the swelling down, and can help lessen the pain.  It is easier to treat pain BEFORE it happens.  Spitting disturbs the clot and may cause bleeding to start again, or to get worse.  Smoking delays healing and can cause complications.  Sharing prescriptions can be dangerous.  Do not take medications not recently prescribed for you.  Antibiotics may stop birth control pills from working.  Use other means of birth control while on antibiotics.  Warm salt water rinses after the first 24 hours will help lessen the swelling:  Use 1/2 teaspoonful of table salt per oz.of water.  DO NOT:  Do not spit.    Do not drink through a straw.  Strongly advised not to smoke, dip snuff or chew tobacco at least for 3 days.  Do not eat sharp or crunchy foods.  Avoid the area of surgery when chewing.  Do not stop your antibiotics before your instructions say to do so.  Do not eat hot foods until bleeding has stopped.  If you need to, let your food cool down to room temperature.  EXPECT:  Some swelling, especially first 2-3 days.  Soreness or discomfort in varying degrees.  Follow your dentist's instructions about how to handle pain before it starts.  Pinkish saliva or light blood in saliva, or on your pillow in the morning.  This can last around 24 hours.  Bruising inside or outside the mouth.  This may not show up until 2-3 days after surgery.  Don't worry, it will go away in time.  Pieces of "bone" may work themselves loose.  It's OK.  If they bother you, let us know.    WHAT TO DO IMMEDIATELY AFTER SURGERY:  Bite on gauze with steady pressure for 30-45 minutes at a time.  Switch out the gauze after 30-45 minutes for clean gauze, and continue this for 1-2 hours or until bleeding subsides.  Do not chew on the gauze.  Do not lie down flat.  Raise your head support especially for the first 24 hours.  Apply ice to your face on the  side of the surgery.  You may apply it 20 minutes on and a few minutes off.  Ice for 8-12 hours.  You may use ice up to 24 hours.  Before the numbness wears off, take a pain pill as instructed.  Prescription pain medication is not always required.  SWELLING:  Expect swelling for the first couple of days.  It should get better after that.  If swelling increases 3 days or so after surgery, let us know as soon as possible.  FEVER:  Take Tylenol every 4 hours if needed to lower your temperature, especially if it is at 100F or higher.  Drink lots of fluids.  If the fever does not go away, let us know.  BREATHING TROUBLE:  Any unusual difficulty breathing means you have to have someone bring you to the emergency room ASAP.  BLEEDING:  Light oozing is expected for 24  hours or so.  Prop head up with pillows.  Do not spit.  Do not confuse bright red fresh flowing blood with lots of saliva colored with a little bit of blood.  If you notice some bleeding, place gauze or a tea bag where it is bleeding and apply CONSTANT pressure by biting down for 1 hour.  Avoid talking during this time.  Do not remove the gauze or tea bag during this hour to "check" the bleeding.  If you notice bright RED bleeding FLOWING out of particular area, and filling the floor of your mouth, put a wad of gauze on that area, bite down firmly and constantly.  Call us immediately.  If we're closed, have someone bring you to the emergency room.  ORAL HYGIENE:  Brush your teeth as usual after meals and before bedtime.  Use a soft toothbrush around the area of surgery.  DO NOT AVOID BRUSHING.  Otherwise bacteria(germs) will grow and may delay healing or encourage infection.  Since you cannot spit, just gently rinse and let the water flow out of your mouth.  DO NOT SWISH HARD.  EATING:  Cool liquids are a good point to start.  Increase to soft foods as tolerated.   PRESCRIPTIONS:  Follow the directions  for your prescriptions exactly as written.  If your doctor gave you a narcotic pain medication, do not drive, operate machinery or drink alcohol when on that medication.   QUESTIONS?  Call our office during office hours [phone] or call the Emergency Room at [phone].

## 2020-11-08 NOTE — Interval H&P Note (Signed)
History and Physical Interval Note:  11/08/2020 7:08 AM  Donald [name]  has presented today for surgery with the diagnosis of dental caries.  The various methods of treatment have been discussed with the patient and family.  After consideration of risks, benefits and other options for treatment, the patient has consented to  Procedure(s): MULTIPLE EXTRACTION WITH ALVEOLOPLASTY (N/A) as a surgical intervention.  The patient's history has been reviewed, patient examined, no change in status and stable for surgery.  I have reviewed the patient's chart and labs.  Questions were answered to the patient's satisfaction.     Sharman Cheek

## 2020-11-08 NOTE — Progress Notes (Signed)
   Department of Dental Medicine    PREOPERATIVE NOTE   SERVICE DATE:   11/08/2020  PATIENT'S NAME:   Donald [name] MEDICAL RECORD NUMBER:  [id number]   . Donald [name] presents today for dental procedures in the operating room. . The patient denies any acute medical or dental changes and agrees to proceed with treatment as planned.   VITALS: BP (!) 125/94   Pulse 65   Temp 98.3 F (36.8 C) (Oral)   Resp 18   Ht 5\' 9"  (1.753 m)   Wt 93.4 kg   SpO2 96%   BMI 30.42 kg/m     LABS: Lab Results  Component Value Date   WBC 8.8 11/08/2020   HGB 13.3 11/08/2020   HCT 40.8 11/08/2020   MCV 88.1 11/08/2020   PLT 196 11/08/2020   BMET    Component Value Date/Time   NA 135 11/08/2020 0556   K 4.6 11/08/2020 0556   CL 106 11/08/2020 0556   CO2 23 11/08/2020 0556   GLUCOSE 140 (H) 11/08/2020 0556   BUN 11 11/08/2020 0556   CREATININE 0.76 11/08/2020 0556   CALCIUM 8.7 (L) 11/08/2020 0556   GFRNONAA >60 11/08/2020 0556   GFRAA >60 01/09/2015 1630    No results found for: INR, PROTIME No results found for: PTT   EXAM: . No signs of acute dental changes.   ASSESSMENT: 1. Dental caries 2. Periodontitis   PLAN: . The patient agrees to proceed with dental treatment in the operating room as previously discussed and planned, and accepts the risks, benefits and complications of the proposed treatment.   . The patient is aware of the risk for bleeding, bruising, swelling, infection, pain, nerve damage, sinus involvement, root tip fracture, mandible fracture and the risks of complications associated with the anesthesia.  The patient also is aware of the potential for other complications not mentioned above.   Zyona Pettaway B. 01/11/2015, D.M.D.

## 2020-11-08 NOTE — Anesthesia Postprocedure Evaluation (Signed)
Anesthesia Post Note  Patient: Donald [name]  Procedure(s) Performed: EXTRACTION OF TEETH NUMBER ONE, FIFTHTEEN, SIXTEEN, SEVENTEEN, EIGHTTEEN AND THRTY WITH ALVEOLOPLASTY OF LOWER LEFT QUADRANT. (N/A Mouth)     Patient location during evaluation: PACU Anesthesia Type: General Level of consciousness: awake and alert Pain management: pain level controlled Vital Signs Assessment: post-procedure vital signs reviewed and stable Respiratory status: spontaneous breathing, nonlabored ventilation, respiratory function stable and patient connected to nasal cannula oxygen Cardiovascular status: blood pressure returned to baseline and stable Postop Assessment: no apparent nausea or vomiting Anesthetic complications: no   No complications documented.  Last Vitals:  Vitals:   11/08/20 0935 11/08/20 0950  BP: (!) 141/107 (!) 133/99  Pulse: 71 65  Resp: (!) 23 (!) 26  Temp:  36.9 C  SpO2: 94% 98%    Last Pain:  Vitals:   11/08/20 0950  TempSrc:   PainSc: 3                  [name] L Ailish Prospero

## 2020-11-08 NOTE — Anesthesia Procedure Notes (Signed)
Procedure Name: Intubation Date/Time: 11/08/2020 7:33 AM Performed by: Shary Decamp, CRNA Pre-anesthesia Checklist: Patient identified, Emergency Drugs available, Suction available and Patient being monitored Patient Re-evaluated:Patient Re-evaluated prior to induction Oxygen Delivery Method: Circle system utilized Preoxygenation: Pre-oxygenation with 100% oxygen Induction Type: IV induction Ventilation: Mask ventilation without difficulty Laryngoscope Size: Miller and 2 Nasal Tubes: Nasal prep performed, Nasal Rae and Right Tube size: 7.5 mm Number of attempts: 1 Placement Confirmation: ETT inserted through vocal cords under direct vision,  positive ETCO2 and breath sounds checked- equal and bilateral Tube secured with: Tape Dental Injury: Teeth and Oropharynx as per pre-operative assessment

## 2020-11-08 NOTE — Transfer of Care (Signed)
Immediate Anesthesia Transfer of Care Note  Patient: Jerron R Donn  Procedure(s) Performed: EXTRACTION OF TEETH NUMBER ONE, FIFTHTEEN, SIXTEEN, SEVENTEEN, EIGHTTEEN AND THRTY WITH ALVEOLOPLASTY OF LOWER LEFT QUADRANT. (N/A Mouth)  Patient Location: PACU  Anesthesia Type:General  Level of Consciousness: drowsy, patient cooperative and responds to stimulation  Airway & Oxygen Therapy: Patient Spontanous Breathing and Patient connected to nasal cannula oxygen  Post-op Assessment: Report given to RN and Post -op Vital signs reviewed and stable  Post vital signs: Reviewed and stable  Last Vitals:  Vitals Value Taken Time  BP 137/101 11/08/20 0850  Temp 36.5 C 11/08/20 0850  Pulse 69 11/08/20 0852  Resp 25 11/08/20 0852  SpO2 99 % 11/08/20 0852  Vitals shown include unvalidated device data.  Last Pain:  Vitals:   11/08/20 0641  TempSrc:   PainSc: 0-No pain         Complications: No complications documented.

## 2020-11-08 NOTE — Op Note (Addendum)
Department of Dental Medicine   OPERATIVE REPORT   DATE OF SURGERY:   11/08/2020  PATIENT'S NAME:   Donald [name] DATE OF BIRTH:   1986-04-18 MEDICAL RECORD NUMBER: [id number]  SURGEON:   Quantavious Eggert B. Chales Salmon, D.M.D.  ASSISTANT:  Pearletha Alfred, DAII  PREOPERATIVE DIAGNOSES:  Dental caries, periodontitis  POSTOPERATIVE DIAGNOSES:  Dental caries, periodontitis  Patient Active Problem List   Diagnosis Date Noted  . Tricuspid regurgitation   . Mitral regurgitation   . Chronic systolic heart failure (HCC)   . Autoimmune disorder (HCC)   . Seronegative spondylitis (HCC)    PROCEDURES PERFORMED: 1. Extractions of teeth numbers 1, 15, 16, 17, 18 and 30 2. 1 quadrant of alveoloplasty (LLQ)  ANESTHESIA:  General anesthesia via nasal endotracheal tube.  MEDICATIONS: 1. Ancef 2 g IV prior to invasive dental procedures. 2. Local anesthesia with a total utilization of 3 cartridges of 34 mg of lidocaine with 0.018 mg of epinephrine/ea.  SPECIMENS:  6 teeth that were extracted and discarded  DRAINS/CULTURES:  None  COMPLICATIONS:  None  ESTIMATED BLOOD LOSS:  5 mL  INTRAVENOUS FLUIDS:  10 mL of Lactated ringers solution  INDICATIONS:  The patient was recently diagnosed with recurrence of mitral regurgitation (previous MVR in 2019 and is anticipating repeat MVR).  A medically necessary dental consult was then requested to evaluate the patient for any dental/orofacial infection and their overall oral health.  The patient was examined and subsequently treatment planned for multiple extractions of infected and/or grossly decayed teeth.  This treatment plan was made to decrease the perioperative and postoperative risks and complications associated with dental/orofacial infection from affecting the patient's systemic health.  OPERATIVE FINDINGS:  The patient was examined in operating room number 8.  The indicated teeth were identified and verified for extraction. The patient was  noted be affected by severe dental decay and chronic periodontitis.  DESCRIPTION OF PROCEDURE:  The patient was identified in the holding area and brought to the main operating room number 8 by the anesthesia team. The patient was then placed in the supine position on the operating table.  General anesthesia was then induced per the anesthesia team. The patient was then prepped and draped in the usual sterile fashion for dental medicine procedures.  A timeout was performed. The patient was identified and procedures were verified. A throat pack was placed at this time. The oral cavity was then thoroughly examined with the findings noted above. The patient was then ready for the dental medicine procedure as follows:   ANESTHESIA: Local anesthesia was administered sequentially with a total utilization of 3 cartridges each containing 34 mg of lidocaine with 0.018 mg of epinephrine.  Location of anesthesia included URQ infiltration and palatal, LRQ infiltration and lingual, ULQ greater palatine and infiltration, LLQ long buccal nerve block, lingual and infiltration.  ROUTINE EXTRACTIONS: The maxillary left and right quadrants were first approached. The teeth were then subluxated with a series of straight elevators.  Teeth numbers 1, 15 and 16 were then removed with a 150 forceps without complications.  The tissues were approximated and trimmed appropriately to help achieve primary closure.  The surgical sites were then irrigated with copious amounts of sterile saline.  Surgi Foam was placed in each extraction site.   The surgical sites were closed using 3-0 chromic gut sutures as follows: 2 simple interrupted style sutures placed to close #15 and #16 extraction sites.  The mandibular left and right quadrants were then approached. The teeth were  subluxated with a series of straight elevators.  Teeth numbers 17, 18 and 30 were then removed utilizing a 151 forceps and 23 cowhorn without complications.   Alveoloplasty was then performed utilizing a rongeurs and bone file in the lower left quadrant. The tissues were approximated and trimmed appropriately to help achieve primary closure. The surgical sites were then irrigated with copious amounts of sterile saline.  Surgi Foam was placed in each extraction site.  The surgical sites were closed using 3-0 chromic gut sutures as follows: 2 figure-8 style sutures placed over #17 and #18 extraction sites.   END OF PROCEDURE: Thorough oral irrigation with sterile saline was performed.  Good hemostasis was observed.  The patient was examined for complications, and seeing none, the dental medicine procedure was deemed to be complete.  The throat pack was removed at this time. An oral airway was then placed at the request of the anesthesia team.  A series of 4x4 gauze were placed in the mouth to aid hemostasis as needed.  The patient was then handed over to the anesthesia team for final disposition.  After an appropriate amount of time, the patient was extubated and taken to the postanesthsia care unit in stable condition.  All counts were correct for the dental medicine procedure.     Nicholes Hibler B. Chales Salmon, D.M.D.

## 2020-11-09 ENCOUNTER — Encounter (HOSPITAL_COMMUNITY): Payer: Self-pay | Admitting: Dentistry

## 2020-11-16 ENCOUNTER — Other Ambulatory Visit: Payer: Self-pay

## 2020-11-16 ENCOUNTER — Ambulatory Visit (INDEPENDENT_AMBULATORY_CARE_PROVIDER_SITE_OTHER): Payer: Medicaid Other | Admitting: Dentistry

## 2020-11-16 DIAGNOSIS — K08199 Complete loss of teeth due to other specified cause, unspecified class: Secondary | ICD-10-CM

## 2020-11-16 NOTE — Progress Notes (Signed)
Department of Dental Medicine     POSTOPERATIVE VISIT  Service Date:   11/16/2020  Patient Name:   Donald [name] Date of Birth:   April 10, 1986 Medical Record Number: [id number]  TODAY'S VISIT    Assessment:   The patient continues to heal well and consistent with dental procedures performed.  Plan:  Follow-up as needed.   The patient is cleared for cardiac surgery from a dental perspective at this time. Recommendations: Establish care at an outside dental office for routine treatment including replacement of missing teeth as needed, cleanings/periodontal therapy and exams.  Discussed in detail all treatment options and recommendations with the patient and they are agreeable to the plan.  PROGRESS NOTE:   COVID-19 SCREENING:  The patient denies symptoms concerning for COVID-19 infection including fever, chills, cough, or newly developed shortness of breath.   HISTORY OF PRESENT ILLNESS Donald [name] presents today for a postoperative visit s/p multiple extractions in the operating room on 5/26.   Medical and dental history reviewed with the patient.  No changes reported.   CHIEF COMPLAINT:   Patient reports that he still has some throbbing/generalized soreness, but is otherwise doing well.   Patient Active Problem List   Diagnosis Date Noted  . Caries   . Chronic periodontitis   . Retained dental root   . Tricuspid regurgitation   . Mitral regurgitation   . Chronic systolic heart failure (HCC)   . Autoimmune disorder (HCC)   . Seronegative spondylitis (HCC)    Past Medical History:  Diagnosis Date  . Anemia   . Autoimmune disorder (HCC)    pyoderma gangrenosum  . CHF (congestive heart failure) (HCC)   . Chronic systolic heart failure (HCC)    a. EF 35-40% by echo in 07/2018 b. EF at 45% by repeat echo in 04/2020  . DVT (deep venous thrombosis) (HCC)    h/o  . Dysrhythmia   . Mitral regurgitation    a. s/p MV repair with resection of ruptured  anterior papillary muscle and reconstruction of papillary chord and placement of annuloplasty ringin 2019. b. 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  . Mitral stenosis   . Myocardial infarction (HCC)   . Paroxysmal atrial flutter (HCC)   . Pyoderma gangrenosa   . Seronegative spondylitis (HCC)    arthritis  . Tricuspid regurgitation    Past Surgical History:  Procedure Laterality Date  . CARDIAC CATHETERIZATION    . HERNIA REPAIR Right 2018  . MITRAL VALVE REPAIR  06/07/2018   [facility] - Dr Meda Klinefelter  . MULTIPLE EXTRACTIONS WITH ALVEOLOPLASTY N/A 11/08/2020   Procedure: EXTRACTION OF TEETH NUMBER ONE, FIFTHTEEN, SIXTEEN, SEVENTEEN, EIGHTTEEN AND THRTY WITH ALVEOLOPLASTY OF LOWER LEFT QUADRANT.;  Surgeon: Sharman Cheek, DMD;  Location: [facility] OR;  Service: Dentistry;  Laterality: N/A;  . RIGHT/LEFT HEART CATH AND CORONARY ANGIOGRAPHY N/A 09/11/2020   Procedure: RIGHT/LEFT HEART CATH AND CORONARY ANGIOGRAPHY;  Surgeon: Dolores Patty, MD;  Location: [facility] INVASIVE CV LAB;  Service: Cardiovascular;  Laterality: N/A;  . TEE WITHOUT CARDIOVERSION N/A 05/23/2020   Procedure: TRANSESOPHAGEAL ECHOCARDIOGRAM (TEE) WITH PROPOFOL;  Surgeon: [name], MD;  Location: [facility] END[facility] SUITE;  Service: Endoscopy;  Laterality: N/A;   Current Outpatient Medications  Medication Sig Dispense Refill  . amiodarone (PACERONE) 200 MG tablet Take 1 tablet (200 mg total) by mouth daily. Take 1 tablet 2 times daily for four weeks, then decrease to 1 tablet once daily 146 tablet 2  . apixaban (ELIQUIS)  5 MG TABS tablet Take 5 mg by mouth in the morning.    . ferrous sulfate 325 (65 FE) MG tablet Take 1 tablet (325 mg total) by mouth daily. (Patient taking differently: Take 325 mg by mouth in the morning.) 90 tablet 1  . furosemide (LASIX) 40 MG tablet Take 1 tablet (40 mg total) by mouth 2 (two) times daily. (Patient taking differently: Take 80 mg by mouth in the morning.) 180 tablet 3  . metoprolol succinate  (TOPROL-XL) 50 MG 24 hr tablet Take 1 tablet (50 mg total) by mouth daily. Take with or immediately following a meal. (Patient taking differently: Take 50 mg by mouth in the morning. Take with or immediately following a meal.) 90 tablet 3  . potassium chloride SA (KLOR-CON) 20 MEQ tablet Take 1 tablet (20 mEq total) by mouth 2 (two) times daily. (Patient taking differently: Take 20-40 mEq by mouth in the morning.) 60 tablet 0  . sacubitril-valsartan (ENTRESTO) 24-26 MG Take 1 tablet by mouth 2 (two) times daily. 60 tablet 3   No current facility-administered medications for this visit.   No Known Allergies   LABS: Lab Results  Component Value Date   WBC 8.8 11/08/2020   HGB 13.3 11/08/2020   HCT 40.8 11/08/2020   MCV 88.1 11/08/2020   PLT 196 11/08/2020   BMET    Component Value Date/Time   NA 135 11/08/2020 0556   K 4.6 11/08/2020 0556   CL 106 11/08/2020 0556   CO2 23 11/08/2020 0556   GLUCOSE 140 (H) 11/08/2020 0556   BUN 11 11/08/2020 0556   CREATININE 0.76 11/08/2020 0556   CALCIUM 8.7 (L) 11/08/2020 0556   GFRNONAA >60 11/08/2020 0556   GFRAA >60 01/09/2015 1630    No results found for: INR, PROTIME No results found for: PTT   VITALS: BP (!) 135/100 (BP Location: Right Arm)   Pulse 66   Temp 98.4 F (36.9 C) (Oral)    EXAM: Extraction sites appear to be healing WNL.  No signs of wound dehiscence or infection evident upon examination. Sutures remain in-tact in the lower left quadrant.   ASSESSMENT:   Postoperative course is consistent with dental procedures performed.   PROCEDURES: Suture removal.  The patient was given a chlorhexidine gluconate rinse for 30 seconds.  Sutures removed without complication from the lower left quadrant.   PLAN AND RECOMMENDATIONS: Follow-up as needed.    Establish care at an outside dental office for routine dental care including replacement of missing teeth as needed, cleanings/periodontal therapy and exams.    Recommend that the patient discuss plans to return to the dentist for non-urgent treatment with their medical team to ensure they are medically optimized and there are no contraindications. Call if any questions or concerns arise.  All questions and concerns were invited and addressed.  The patient tolerated today's visit well and departed in stable condition.  Ruslan Mccabe B. Chales Salmon, D.M.D.

## 2020-11-23 ENCOUNTER — Encounter (HOSPITAL_COMMUNITY): Payer: Medicaid Other | Admitting: Internal Medicine

## 2021-01-21 ENCOUNTER — Ambulatory Visit: Payer: Medicaid Other | Admitting: Cardiology

## 2021-01-21 NOTE — Progress Notes (Deleted)
Clinical Summary Donald [name] is a 35 y.o.maleseen today for follow up of the following medical problems.    1. Mitral regurgitation - admit 05/2018 with acute respiratory failure. From notes had cardaic arrest with induction for intubation. - TTE done showed severe MR - TEE showed severe prolapse and possible flail A3 scallop with severe MR.   - 06/07/2018 went to OR for MV repair with resection of ruptured anterior papillary muscle, reconstruction of papillary chord with two Neo-chords and placement of Simplici-T Annuloplasty ring via superior septal approach - Intraop/Postop TEE reports no MR initially after surgery     07/2018 TEE during repeat admission mobile echodensity posterior leaflet, possible surgical suture, vs torn chordae vs vegetation. The MR is not reported regardign severity, ERO 0.93 would suggest severe, unclear - CT surgery at that time recommended continued medical therapy, reconsider repeat intervention at ouptatient followup - thought perhaps an autoimmune disease may have causes some myxomatous degeneration. Undergoing rheum evaluation     - 04/2020 echo LVEF 50%, severe BAE, MV/AV VTI is 3 suggesting possible severe MR.    05/2020 TEE: Severe MR, LVEF 40-45%, mod to severe RV dysfunction - referred to Dr Donald [name], considering MV replacement              2. Chronic systolic HF - readmitted 07/2018 with volume overload - multiple TTE's and TEEs as reported below. Last LVEF 35-40% by TTE in 07/2018, LVIDd 5.6 and LVIDs 4.3, mild LAE.  - discharged on Lasix 40mg  daily, lisinopril 5mg , TOprol 25mg , ASA 81, atorva 10,     - recent issues with fluid overload over the last few months. Partly due to some missed lasix doses. Had an ER visit with pulm edema, did not require admission 04/2020 echo LVEF 50%, severe BAE, MV/AV VTI is 3 suggesting possible severe MR.     ER visit 04/2020 with volume overload, managed in ER with diuresis. - admit 05/18/20 with volume  overload, diuresed and dischaged. ProBNP was 7000. K 3.3 Cr 1. CXR with pulm edema - presented with abdominal distension, SOB.Had been compliant lasix 40mg  bid.      08/2020 LHC/RHC Normal coronaries, LVEF 30-35%. CI 2.48 Mean PA 32 PCWP17 LVEDP 8 - followed in CHF clinic. Recently changed to entresto     - no recent edema - no SOB/DOE.    3. 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    4. 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 Atrial flutter - several episodes in setting of severe LAE - last DC-CV in 08/24/19.  - was to start amio after last CHF clinic appt but appears has not started - no recent palpitations - compliant with eliquis     7. Preop dental surgery - requiring dental surgery - will require amoxicllin 2g x 1 day of procedure given prior anuloplastic ring, hold eliuquis 2 days prior and resume day after.        SH: mother is Donald [name], also a patient of mine   Has a sone Donald [name] 1.33 yo.    Past Medical History:  Diagnosis Date   Anemia    Autoimmune disorder (  HCC)    pyoderma gangrenosum   CHF (congestive heart failure) (HCC)    Chronic systolic heart failure (HCC)    a. EF 35-40% by echo in 07/2018 b. EF at 45% by repeat echo in 04/2020   DVT (deep venous thrombosis) (HCC)    h/o   Dysrhythmia    Mitral regurgitation    a. 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 b. 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HCC)     arthritis   Tricuspid regurgitation      No Known Allergies   Current Outpatient Medications  Medication Sig Dispense Refill   amiodarone (PACERONE) 200 MG tablet Take 1 tablet (200 mg total) by mouth daily. Take 1 tablet 2 times daily for four weeks, then decrease to 1 tablet once daily 146 tablet 2   apixaban (ELIQUIS) 5 MG TABS tablet Take 5 mg by mouth in the morning.     ferrous sulfate 325 (65 FE) MG tablet Take 1 tablet (325 mg total) by mouth daily. (Patient taking differently: Take 325 mg by mouth in the morning.) 90 tablet 1   furosemide (LASIX) 40 MG tablet Take 1 tablet (40 mg total) by mouth 2 (two) times daily. (Patient taking differently: Take 80 mg by mouth in the morning.) 180 tablet 3   metoprolol succinate (TOPROL-XL) 50 MG 24 hr tablet Take 1 tablet (50 mg total) by mouth daily. Take with or immediately following a meal. (Patient taking differently: Take 50 mg by mouth in the morning. Take with or immediately following a meal.) 90 tablet 3   potassium chloride SA (KLOR-CON) 20 MEQ tablet Take 1 tablet (20 mEq total) by mouth 2 (two) times daily. (Patient taking differently: Take 20-40 mEq by mouth in the morning.) 60 tablet 0   sacubitril-valsartan (ENTRESTO) 24-26 MG Take 1 tablet by mouth 2 (two) times daily. 60 tablet 3   No current facility-administered medications for this visit.     Past Surgical History:  Procedure Laterality Date   CARDIAC CATHETERIZATION     HERNIA REPAIR Right 2018   MITRAL VALVE REPAIR  06/07/2018   [facility] - Dr Donald [name]   MULTIPLE EXTRACTIONS WITH ALVEOLOPLASTY N/A 11/08/2020   Procedure: EXTRACTION OF TEETH NUMBER ONE, FIFTHTEEN, SIXTEEN, SEVENTEEN, EIGHTTEEN AND THRTY WITH ALVEOLOPLASTY OF LOWER LEFT QUADRANT.;  Surgeon: Donald [name], DMD;  Location: [facility] OR;  Service: Dentistry;  Laterality: N/A;   RIGHT/LEFT HEART CATH AND CORONARY ANGIOGRAPHY N/A 09/11/2020   Procedure: RIGHT/LEFT HEART CATH AND CORONARY  ANGIOGRAPHY;  Surgeon: Donald Patty, MD;  Location: [facility] INVASIVE CV LAB;  Service: Cardiovascular;  Laterality: N/A;   TEE WITHOUT CARDIOVERSION N/A 05/23/2020   Procedure: TRANSESOPHAGEAL ECHOCARDIOGRAM (TEE) WITH PROPOFOL;  Surgeon: [name], MD;  Location: [facility] ENDO SUITE;  Service: Endoscopy;  Laterality: N/A;     No Known Allergies    Family History  Problem Relation Age of Onset   Multiple sclerosis Mother    Psoriasis Mother    Depression Father    Diabetes Father    Diabetes Paternal Grandmother      Social History Mr. Donald [name] reports that he has quit smoking. His smoking use included cigarettes. He has never used smokeless tobacco. Mr. Comp reports no history of  alcohol use.   Review of Systems CONSTITUTIONAL: No weight loss, fever, chills, weakness or fatigue.  HEENT: Eyes: No visual loss, blurred vision, double vision or yellow sclerae.No hearing loss, sneezing, congestion, runny nose or sore throat.  SKIN: No rash or itching.  CARDIOVASCULAR:  RESPIRATORY: No shortness of breath, cough or sputum.  GASTROINTESTINAL: No anorexia, nausea, vomiting or diarrhea. No abdominal pain or blood.  GENITOURINARY: No burning on urination, no polyuria NEUROLOGICAL: No headache, dizziness, syncope, paralysis, ataxia, numbness or tingling in the extremities. No change in bowel or bladder control.  MUSCULOSKELETAL: No muscle, back pain, joint pain or stiffness.  LYMPHATICS: No enlarged nodes. No history of splenectomy.  PSYCHIATRIC: No history of depression or anxiety.  ENDOCRINOLOGIC: No reports of sweating, cold or heat intolerance. No polyuria or polydipsia.  Marland Kitchen   Physical Examination There were no vitals filed for this visit. There were no vitals filed for this visit.  Gen: resting comfortably, no acute distress HEENT: no scleral icterus, pupils equal round and reactive, no palptable cervical adenopathy,  CV Resp: Clear to auscultation bilaterally GI:  abdomen is soft, non-tender, non-distended, normal bowel sounds, no hepatosplenomegaly MSK: extremities are warm, no edema.  Skin: warm, no rash Neuro:  no focal deficits Psych: appropriate affect   Diagnostic Studies 06/06/18 Echo [facility] SUMMARY Mild left ventricular hypertrophy The left ventricle is mildly dilated. The apex is contracting normally. The rest of LV wall is hypokinetic. LV ejection fraction = 35-40%.  Left ventricular systolic function is moderately reduced. The right ventricle is normal in size and function. There is severe mitral regurgitation. There is mild mitral valve thickening. Suspect anterior mitral valve prolapse. Recommend TEE for further evaluation. Normal IVC size with decreased respiratory collapse. There is no pericardial effusion. There is no comparison study available.     06/06/2018 TEE [facility] SUMMARY There is severe prolapse (possible flail) of A3 scallop resulting in severe,  posteriorly directed regurgitation with a 0.5 cm gap in closure. Pulmonary  vein flow shows systolic reversal. Papillary muscles appear intact. No  vegetations seen. The left ventricular size is normal.  Left ventricular systolic function is mildly reduced. The right ventricle is normal in size and function. Diffuse thickening of the aortic valve with preserved cusp opening. There is  an extremely small ( <2 mm) echodensity on the ventricular side of the aortic  valve--no significant aortic regurgitation; recommend clinical correlation. There is mild tricuspid regurgitation. No overt vegetations identified. 3D Echocardiography was performed and reviewed for assessment of cardiac  structure and function using a workstation. Findings were integrated into the  echo report.   06/07/18 Intraop TEE Pre-Intervention Summary An Omniplane TEE probe was inserted orally. A full TEE exam was performed  including 2D, M-Mode, Color and Doppler. The TEE probe was placed   atraumatically on the first attempt. The left ventricle is severely dilated. There is normal left ventricular wall  thickness. Left ventricular systolic function is moderately reduced. The EF  is 30-35%. There is mild to moderate global hypokinesis of the left  ventricle. There is moderate to severe inferior wall hypokinesis. The right ventricle is moderate to severely dilated. The right ventricular  systolic function is mildly reduced. The interatrial septum is intact with no evidence for an atrial septal  defect. The left atrium is severely dilated. Right atrial size is normal. There is a ruptured chordae with flail of the A3 segment of the anterior  mitral leaflet. No significant mitral valve stenosis. There is severe mitral  regurgitation. Flow reversal noted in pulmonary veins consistent with  significant mitral regurgitation. The mitral regurgitant jet is posteriorly  directed, which is consistent with anterior leaflet pathology. The tricuspid valve is normal in structure and function. There is trace  tricuspid regurgitation. The aortic valve is trileaflet. The aortic valve opens well. There is a small  irregularity on the left coronary cusp adjacent to the right coronary cusp.  Cannot exclude aortic valvular vegetation. No hemodynamically significant  valvular aortic stenosis. Trace to mild AI with central jet. The pulmonic valve is normal in structure and function. > The aortic root is normal size. No obvious dissection could be visualized.  There is no pericardial effusion. There is no pleural effusion.  Post-Intervention Summary The EF remains 30-35%. There is moderate to severe inferior wall hypokinesis. The right ventricular systolic function is normal. The interatrial septum is intact with no evidence for an atrial septal defect. There is no mitral regurgitation noted. An annuloplasty ring is noted in the  mitral position. There is trace tricuspid regurgitation. Trace to  mild AI. The pulmonic valve is not well visualized. No obvious dissection could be visualized. There is no pericardial effusion.  There is no pleural effusion. MMode/2D Measurements & Calculations EDV(MOD-sp4): 72.3 ml ESV(MOD-sp4): 117.0 ml EDV(MOD-sp2): 257.0 ml ESV(MOD-sp2): 150.0 ml SV(MOD-sp4): -44.7 ml Pre-Intervention Doppler Measurements and Calculations MV max PG: 11.1 mmHg MV mean PG: 4.8 mmHg Ao V2 max: 87.0 cm/sec Ao max PG: 3.0 mmHg Ao V2 mean: 51.9 cm/sec Ao mean PG: 1.4 mmHg Ao V2 VTI: 13.4 cm   Overall TEE Interpretation Summary Pre-intervention exam: The LV is severey dilated with mild to moderate global  hypokinesis. The EF is 30-35%. There is moderate to severe hypokinesis of the  inferior wall. The RV systolic function is mildly reduced. There is a  ruptured chordae and flail of the A3 segment of the anterior mitral leaflet.  The posterior leaflet appears normal. Severe MR with posteriorly directed jet  and systolic flow reversal in the pulmonary veins. There is a small echodense  irregularity on the left coronary cusp of the aortic valve adjacent to the  right coronary cusp. Vegetation cannot be ruled out. There is trace to mild  AI with a central jet. There is trace TR. There is no obvious aortic  dissection. There is no pericardial effusion. There is no pleural effusion. Post-intervention exam: The LV EF remains unchanged from the pre-intervention  exam. The RV systolic function has improved and is now normal. There is an  annuloplasty ring in the mitral position. There is no MR. Otherwise the exam  is unchanged from the pre-intervention exam. At the conclusion of the  procedure the probe was removed. There were no apparant complications related  to the TEE examination.     Jun 18 2018 echo SUMMARY There is normal left ventricular wall thickness. The left ventricle is mildly dilated.  Left ventricular systolic function is mildly reduced. LV ejection  fraction = 40-45%. Abnormal (paradoxical) septal motion consistent with post-operative status. There is hypokinesis of the basal to mid inferior wall. The right ventricle is normal in size and function. The left atrial size is normal. An annuloplasty ring is noted in the mitral position. The mean gradient across the mitral valve is 7.6 mmHg. There is no mitral regurgitation noted. The aortic sinus is normal in size. IVC size was normal. There is no pericardial effusion. Compared to the last surface echo dated 06/06/18, the LV systolic function is  slightly  improved and the patient is s/p mitral valve repair.   Jul 28 2018 TTE SUMMARY The left ventricle is mildly dilated.  Left ventricular systolic function is moderately reduced. LV ejection fraction = 35-40%. The right ventricle is normal size. The right ventricular systolic function is mildly reduced. The left atrium is mildly dilated. There is mild aortic regurgitation. Status post mitral valve repair with annuloplasty ring There is mild to moderate mitral regurgitation. The mean gradient across the mitral valve is 9.8 mmHg. The heart rate for the mean mitral valve gradient is 92 BPM. There is mild tricuspid regurgitation. There was insufficient TR detected to calculate RV systolic pressure. Estimated right atrial pressure is 10 mmHg.Marland Kitchen There is no pericardial effusion.     Jul 28 2018 TEE - SUMMARY s/p Mitral valve repair with resection of ruptured anterior papillary muscle,  reconstruction of papillary chord with two Neo-chords and placement of  Simplici-T Annuloplasty ring. Noted a mobile echodensity on the posterior leaflet of the mitral valve,  which may represents surgical suture. Differential include torn mitral valve  chordae or vegetation. Compared to the post-op TEE, MR appears worse.  Clinical correlation advised. There is mild aortic regurgitation. A mobile echodensity noted on the left coronary cusp of the  aortic valve  which was seen in the previous TEEs. Noted mild eccentric AI.     Jan 2020 CT scan 1. Soft tissue density in the anterior mediastinum adjacent to the median sternotomy which may simply be postoperative, but cannot exclude cellulitis. No drainable fluid collection. 2. Trace hydropneumopericardium as well as small locules of air in the anterior mediastinum inferiorly, most likely postoperative in nature. 3. Significant improved aeration when compared to prior study with residual atelectasis/scarring in the right lower lobe. There is residual airspace consolidation left lower lobe consistent with resultant discoid atelectasis or pneumonia. 4. . Filling defect in the right jugular vein extending into the right brachiocephalic vein as well as a more broad-based filling defect in the left brachiocephalic vein, consistent with thrombus. 5. Interval sternotomy and mitral valve replacement with expected postoperative changes.     06/07/18 surgery note  PREOPERATIVE DIAGNOSIS: ICD-10 I34.0 Mitral valve insufficiency  POSTOPERATIVE DIAGNOSIS: same  QUALITY MEASURES: 4047F Documentation of order for prophylactic antibiotics to be given within one hour (if fluoroquinolone or vancomycin, two hours) prior to surgical incision (or start of procedure when no incision is required) 4041F Documentation of order for cefazolin or cefuroxime for antimicrobial prophylaxis  HEMODYNAMICS AND CATH: Ejection fraction: 35%  VALVE DATA: Mitral Valve: Severe Insufficiency  PRIORITY: Emergent  INCIDENCE: First cardiovascular surgery   PROCEDURE: CPT Codes: 28413 * Mitral valve repair with resection of ruptured anterior papillary muscle, reconstruction of papillary chord with two Neo-chords and placement of Simplici-T Annuloplasty ring via superior septal approach Model: 670 Serial: [id number]  CARDIOPULMONARY BYPASS TIME: 131 min  AORTIC CROSS CLAMP TIME: 92 min  DESCRIPTION OF OPERATION After  induction of anesthesia, the patient was prepped and draped. Before the incision was made, time-out was observed by all members of the surgical and anesthesia teams to identify the correct patient and procedure. The heart was exposed via a full conventional sternotomy. The anatomy of the heart and great vessels was normal. On palpation the aorta was normal.   Intraoperative transesophageal echocardiography showed the left ventricular function to be hypokinetic with an ejection fraction of 35%. Exam of the cardiac valves showed severe mitral regurgitation with anterior A2/A3 leaflet prolapse.  After heparinization, cardiopulmonary bypass was instituted.  Arterial perfusion was via an arterial cannula placed in the aorta. For assisted venous return, drainage was from two cannuli placed through the right atrium into the superior and inferior vena cavi. Caval tapes were placed. The patient was cooled to a body core temperature of 36 degrees centigrade. The left ventricle was vented through the both aorta and right superior pulmonary vein.  The ascending aorta was cross-clamped and the heart was arrested with cold DelNido cardioplegia given antegrade. Once dose was adequate for the aforementioned cross clamp time. Topical cooling was used.  The mitral valve was exposed via a superior septal approach incision. The left atrium was carefully examined. There was no clot present. Retraction sutures were placed and the valve was well visualized. The valve was bicuspid and was diseased from an apparent degenerative etiology. The calcification of the leaflets was none and no calcification of the annulus. The valve was inspected and the defect found to be ruptured anterior papillary muscle causing the A2/A3 prolapse. The anterior leaflet was thickened. There were no vegetations or stigmata of endocarditis. The valve was amenable to repair.  The ruptured papillary muscle was resected. The chord was then reconstructed  utilizing two 5-0 Neo-chords. The valve was statically tested and was found to be competent without leak.  Thirteen sutures of 2-0 dacron were placed in simple mattress fashion in the annulus from trigone to trigone posteriorly. A Medtronic Simplici T band was chosen and the sutures passed through the band. The band was seated and the sutures secured via CorKnots. The valve was again statically tested and was found to have a small leak at the medial commissure. A commisuroplasty was performed utilizing 5-0 was performed. The valve then appeared to be hydrostatic.   The left ventricular vent was placed across the mitral valve. The left atrial and interatrial septal portions of the incisions were closed with running 4-0 Prolene. An aortic vent was placed to low suction and the cross clamp released. The right atrial portion of the incision was closed with a 4-0 Prolene suture. Caval tapes were released.  The patient was rewarmed. Cardioversion occurred spontaneously. The underlying rhythm was normal sinus. Atrial and ventricular pacing was used. The patient was weaned and separated from cardiopulmonary bypass. After discontinuation of cardiopulmonary bypass the heart adequately supported the circulation. Inotropes were used upon leaving the operating room. The left ventricular function was moderatly hypokinetic. The right ventricular function was mildly hypokinetic. Post procedure intraoperative transesophageal echocardiogram demonstrated an ejection fraction of 35% and there was well-seated annuloplasty ring with no mitral regurgitation.  The bypass lines were removed and the Heparin reversed with Protamine. The pericardium was drained with one 24 Fr. Blake drain. The pleural space did not require a chest tube. The sternum was reapproximated with #7 and double stranded stainless steel wires. The linea alba was closed with #2 absorbable sutures. The pectoralis fascia was closed with #0 absorbable sutures.  Subcutaneous tissues were closed with 2-0 absorbable suture. The skin was closed with 3-0 absorbable subcuticular suture. Sterile dressings were placed over the wounds.  There were no complications and sponge, instrument, and needle counts were reported as correct. The patient was transported to the Intensive Care Unit in stable condition.      05/2020 echo IMPRESSIONS     1. Left ventricular ejection fraction, by estimation, is 40 to 45%. The  left ventricle has mildly decreased function.   2. Right ventricular systolic function moderately to severely reduced.  The right ventricular size is moderately  enlarged.   3. Left atrial size was severely dilated. No left atrial/left atrial  appendage thrombus was detected. The LAA emptying velocity was 60 cm/s.   4. Right atrial size was severely dilated.   5. Difficult visualization of mitral valve and regurgitant jet. Findings  consistent with prior repair. Thickened leaflets with restricted motion.  The MR jet was not amenable to PISA. Moderate mean gradient across the  valve of 6 mmHg. In images #[id number]  there appears to be regurgitation jet through the valve but also lateral  to the anular ring. The cumulative regurgitation appears to be severe. .  The mitral valve has been repaired/replaced. Severe mitral valve  regurgitation. Moderate mitral stenosis.   6. The tricuspid valve is abnormal. Tricuspid valve regurgitation is  moderate to severe.   7. The aortic valve is tricuspid. Aortic valve regurgitation is not  visualized. No aortic stenosis is present.   8. Technically difficult study, standard views were off axis particularly  of the mitral valve.     08/2020 RHC/LHC Assessment:   1. Normal coronary arteries 2. NICM EF 30-35% 3. Severe MR with prominent v-waves in PCWP tracing 4. Mild pulmonary venous HTN with normal CO    Assessment and Plan  1. Mitral regurgitation - complex history as described above  - severe MR with  biventricular failure, being considered for repeat MV surgery by Dr Donald [name] - upcoming dental evaluatoin and surgery prior to procedure       2. Chronic systolic HF - appears euvolemic, no current symptoms - we will increase toprol to 50mg  daily, continue entresto. Has HF clinic appt next month for further med changes   3. Aflutter - did not start amio since visit with HF clinic, we will start 200mg  bid x 4 weeks then 200mg  daily - continue eliquis, can stop ASA - EKG today shows NSR   4. Preoperative evaluation - planning on dental surgery - he is currently euvolemic, no contraindication - would need to hold eliquis 2 days prior, resume day after - would need amoxicilling 2g day of surgery given prior MV anuloplasty ring        [name], M.D., F.A.C.C.

## 2021-02-25 ENCOUNTER — Encounter (HOSPITAL_COMMUNITY): Payer: Self-pay | Admitting: Emergency Medicine

## 2021-02-25 ENCOUNTER — Emergency Department (HOSPITAL_COMMUNITY): Payer: Self-pay

## 2021-02-25 ENCOUNTER — Telehealth: Payer: Self-pay | Admitting: Cardiology

## 2021-02-25 ENCOUNTER — Other Ambulatory Visit: Payer: Self-pay

## 2021-02-25 ENCOUNTER — Emergency Department (HOSPITAL_COMMUNITY)
Admission: EM | Admit: 2021-02-25 | Discharge: 2021-02-25 | Disposition: A | Discharge status: Home / Self Care | Payer: Self-pay | Attending: Emergency Medicine | Admitting: Emergency Medicine

## 2021-02-25 DIAGNOSIS — R2242 Localized swelling, mass and lump, left lower limb: Secondary | ICD-10-CM | POA: Insufficient documentation

## 2021-02-25 DIAGNOSIS — Z87891 Personal history of nicotine dependence: Secondary | ICD-10-CM | POA: Insufficient documentation

## 2021-02-25 DIAGNOSIS — R600 Localized edema: Secondary | ICD-10-CM

## 2021-02-25 DIAGNOSIS — I5022 Chronic systolic (congestive) heart failure: Secondary | ICD-10-CM | POA: Insufficient documentation

## 2021-02-25 DIAGNOSIS — I483 Typical atrial flutter: Secondary | ICD-10-CM | POA: Insufficient documentation

## 2021-02-25 DIAGNOSIS — Z7901 Long term (current) use of anticoagulants: Secondary | ICD-10-CM | POA: Insufficient documentation

## 2021-02-25 LAB — CBC
HCT: 47.8 % (ref 39.0–52.0)
Hemoglobin: 15.7 g/dL (ref 13.0–17.0)
MCH: 30 pg (ref 26.0–34.0)
MCHC: 32.8 g/dL (ref 30.0–36.0)
MCV: 91.4 fL (ref 80.0–100.0)
Platelets: 228 10*3/uL (ref 150–400)
RBC: 5.23 MIL/uL (ref 4.22–5.81)
RDW: 17.6 % — ABNORMAL HIGH (ref 11.5–15.5)
WBC: 11.2 10*3/uL — ABNORMAL HIGH (ref 4.0–10.5)
nRBC: 0.2 % (ref 0.0–0.2)

## 2021-02-25 LAB — BASIC METABOLIC PANEL
Anion gap: 7 (ref 5–15)
BUN: 16 mg/dL (ref 6–20)
CO2: 25 mmol/L (ref 22–32)
Calcium: 8.6 mg/dL — ABNORMAL LOW (ref 8.9–10.3)
Chloride: 103 mmol/L (ref 98–111)
Creatinine, Ser: 1.02 mg/dL (ref 0.61–1.24)
GFR, Estimated: >60 mL/min (ref >60)
Glucose, Bld: 151 mg/dL — ABNORMAL HIGH (ref 70–99)
Potassium: 4.1 mmol/L (ref 3.5–5.1)
Sodium: 135 mmol/L (ref 135–145)

## 2021-02-25 LAB — TROPONIN I (HIGH SENSITIVITY)
Troponin I (High Sensitivity): 65 ng/L — ABNORMAL HIGH (ref <18)
Troponin I (High Sensitivity): 57 ng/L — ABNORMAL HIGH (ref <18)

## 2021-02-25 LAB — BRAIN NATRIURETIC PEPTIDE: B Natriuretic Peptide: 1856 pg/mL — ABNORMAL HIGH (ref 0.0–100.0)

## 2021-02-25 LAB — MAGNESIUM: Magnesium: 1.9 mg/dL (ref 1.7–2.4)

## 2021-02-25 MED ORDER — METOPROLOL TARTRATE 5 MG/5ML IV SOLN
5.0000 mg | Freq: Once | INTRAVENOUS | Status: AC
  Administered 2021-02-25: 5 mg via INTRAVENOUS
  Filled 2021-02-25: qty 5

## 2021-02-25 MED ORDER — FUROSEMIDE 10 MG/ML IJ SOLN
40.0000 mg | Freq: Once | INTRAMUSCULAR | Status: AC
  Administered 2021-02-25: 40 mg via INTRAVENOUS
  Filled 2021-02-25: qty 4

## 2021-02-25 MED ORDER — ONDANSETRON HCL 4 MG PO TABS: 4.0000 mg | ORAL_TABLET | Freq: Four times a day (QID) | ORAL | 0 refills | Status: DC

## 2021-02-25 MED ORDER — ONDANSETRON 4 MG PO TBDP
4.0000 mg | ORAL_TABLET | Freq: Once | ORAL | Status: AC
  Administered 2021-02-25: 4 mg via ORAL
  Filled 2021-02-25: qty 1

## 2021-02-25 MED ORDER — SODIUM CHLORIDE 0.9 % IV BOLUS
500.0000 mL | Freq: Once | INTRAVENOUS | Status: AC
Start: 2021-02-25 — End: 2021-02-25
  Administered 2021-02-25: 500 mL via INTRAVENOUS

## 2021-02-25 MED ORDER — PROPOFOL 10 MG/ML IV BOLUS
0.5000 mg/kg | Freq: Once | INTRAVENOUS | Status: DC
  Filled 2021-02-25: qty 20

## 2021-02-25 MED ORDER — PROPOFOL 10 MG/ML IV BOLUS
40.0000 mg | Freq: Once | INTRAVENOUS | Status: AC
  Administered 2021-02-25: 40 mg via INTRAVENOUS

## 2021-02-25 NOTE — ED Notes (Signed)
Patient transported to X-ray 

## 2021-02-25 NOTE — Sedation Documentation (Addendum)
100 J shocked, successful

## 2021-02-25 NOTE — ED Provider Notes (Signed)
We will [facility] EMERGENCY DEPARTMENT Provider Note   CSN: [id number] Arrival date & time: 02/25/21  1138     History Chief Complaint  Patient presents with   Chest Pain    Friend R Arabie is a 35 y.o. male.  HPI  Patient with significant medical history of CHF EF 40%, DVT, valve regurg, a flutter currently on amiodarone, metoprolol, Eliquis presents to the emergency department with chief complaint of chest pain x2 days.  Patient states chest pain started suddenly, states the pain is coming from his heart beating very quickly, states the fluttering in his chest is constant, pain does not radiate, states remains in the middle of his chest, not associated with becoming diaphoretic, nausea, vomiting, lightheaded or dizziness, he states he feels slightly short of breath but this is only with exertion, he also notes that he is having worsening swelling in his right leg, this started 1 week ago.  He states the swelling comes and goes, it goes away when he wakes up in the morning but then comes back by the end of the day.  He denies calf pain, recent trauma, recent surgeries, long travels.  States has been compliant with all his medications.  He has no other complaints at this time.  After reviewing patient's chart he was recent currently converted in March, given 200 J and converted back to sinus rhythm.   Past Medical History:  Diagnosis Date   Anemia    Autoimmune disorder (HCC)    pyoderma gangrenosum   CHF (congestive heart failure) (HCC)    Chronic systolic heart failure (HCC)    a. EF 35-40% by echo in 07/2018 b. EF at 45% by repeat echo in 04/2020   DVT (deep venous thrombosis) (HCC)    h/o   Dysrhythmia    Mitral regurgitation    a. 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 b. 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   Mitral stenosis    Myocardial infarction (HCC)    Paroxysmal  atrial flutter (HCC)    Pyoderma gangrenosa    Seronegative spondylitis (HCC)    arthritis   Tricuspid regurgitation     Patient Active Problem List   Diagnosis Date Noted   Caries    Chronic periodontitis    Retained dental root    Tricuspid regurgitation    Mitral regurgitation    Chronic systolic heart failure (HCC)    Autoimmune disorder (HCC)    Seronegative spondylitis (HCC)     Past Surgical History:  Procedure Laterality Date   CARDIAC CATHETERIZATION     HERNIA REPAIR Right 2018   MITRAL VALVE REPAIR  06/07/2018   [facility] - Dr Meda Klinefelter   MULTIPLE EXTRACTIONS WITH ALVEOLOPLASTY N/A 11/08/2020   Procedure: EXTRACTION OF TEETH NUMBER ONE, FIFTHTEEN, SIXTEEN, SEVENTEEN, EIGHTTEEN AND THRTY WITH ALVEOLOPLASTY OF LOWER LEFT QUADRANT.;  Surgeon: Sharman Cheek, DMD;  Location: [facility] OR;  Service: Dentistry;  Laterality: N/A;   RIGHT/LEFT HEART CATH AND CORONARY ANGIOGRAPHY N/A 09/11/2020   Procedure: RIGHT/LEFT HEART CATH AND CORONARY ANGIOGRAPHY;  Surgeon: Dolores Patty, MD;  Location: [facility] INVASIVE CV LAB;  Service: Cardiovascular;  Laterality: N/A;   TEE WITHOUT CARDIOVERSION N/A 05/23/2020   Procedure: TRANSESOPHAGEAL ECHOCARDIOGRAM (TEE) WITH PROPOFOL;  Surgeon: [name], MD;  Location: [facility] ENDO SUITE;  Service: Endoscopy;  Laterality: N/A;       Family History  Problem Relation  Age of Onset   Multiple sclerosis Mother    Psoriasis Mother    Depression Father    Diabetes Father    Diabetes Paternal Grandmother     Social History   Tobacco Use   Smoking status: Former    Types: Cigarettes   Smokeless tobacco: Never  Vaping Use   Vaping Use: Never used  Substance Use Topics   Alcohol use: No   Drug use: No    Home Medications Prior to Admission medications   Medication Sig Start Date End Date Taking? Authorizing Provider  amiodarone (PACERONE) 200 MG tablet Take 1 tablet (200 mg total) by mouth daily. Take 1 tablet 2 times  daily for four weeks, then decrease to 1 tablet once daily 10/18/20  Yes Branch, Dorothe Pea, MD  apixaban (ELIQUIS) 5 MG TABS tablet Take 5 mg by mouth in the morning.   Yes [provider]  ferrous sulfate 325 (65 FE) MG tablet Take 1 tablet (325 mg total) by mouth daily. Patient taking differently: Take 325 mg by mouth in the morning. 12/23/18  Yes Branch, Dorothe Pea, MD  furosemide (LASIX) 40 MG tablet Take 1 tablet (40 mg total) by mouth 2 (two) times daily. Patient taking differently: Take 80 mg by mouth in the morning. 06/22/20 02/25/21 Yes Strader, Lennart Pall, PA-C  metoprolol succinate (TOPROL-XL) 50 MG 24 hr tablet Take 1 tablet (50 mg total) by mouth daily. Take with or immediately following a meal. Patient taking differently: Take 50 mg by mouth in the morning. Take with or immediately following a meal. 10/18/20 02/25/21 Yes Branch, Dorothe Pea, MD  ondansetron (ZOFRAN) 4 MG tablet Take 1 tablet (4 mg total) by mouth every 6 (six) hours. 02/25/21  Yes Carroll Sage, PA-C  potassium chloride SA (KLOR-CON) 20 MEQ tablet Take 1 tablet (20 mEq total) by mouth 2 (two) times daily. Patient taking differently: Take 20-40 mEq by mouth in the morning. 08/23/20  Yes Dione Booze, MD  sacubitril-valsartan (ENTRESTO) 24-26 MG Take 1 tablet by mouth 2 (two) times daily. 08/27/20  Yes Bensimhon, Bevelyn Buckles, MD    Allergies    Patient has no known allergies.  Review of Systems   Review of Systems  Constitutional:  Negative for chills and fever.  HENT:  Negative for congestion.   Respiratory:  Negative for chest tightness and shortness of breath.   Cardiovascular:  Positive for chest pain, palpitations and leg swelling.  Gastrointestinal:  Negative for abdominal pain.  Genitourinary:  Negative for enuresis.  Musculoskeletal:  Negative for back pain.  Skin:  Negative for rash.  Neurological:  Negative for dizziness.  Hematological:  Does not bruise/bleed easily.   Physical Exam Updated Vital  Signs BP 106/83   Pulse 83   Temp 97.8 F (36.6 C) (Oral)   Resp 18   Ht 5\' 9"  (1.753 m)   Wt 90.7 kg   SpO2 100%   BMI 29.53 kg/m   Physical Exam Vitals and nursing note reviewed.  Constitutional:      General: He is not in acute distress.    Appearance: He is not ill-appearing.  HENT:     Head: Normocephalic and atraumatic.     Nose: No congestion.  Eyes:     Conjunctiva/sclera: Conjunctivae normal.  Cardiovascular:     Rate and Rhythm: Regular rhythm. Tachycardia present.     Pulses: Normal pulses.     Heart sounds: No murmur heard.   No friction rub. No gallop.  Comments: Patient's heart rate was in the 140s to 150s appears to be a flutter.  Hemodynamically stable Pulmonary:     Effort: No respiratory distress.     Breath sounds: No wheezing, rhonchi or rales.  Abdominal:     Palpations: Abdomen is soft.     Tenderness: There is no abdominal tenderness. There is no right CVA tenderness or left CVA tenderness.  Musculoskeletal:     Right lower leg: Edema present.     Comments: Lower extremities were visualized right leg was slightly larger than the left, he has 1+ pitting edema up to the shins, no lymphedema skin changes present, neurovascular fully intact.  No palpable cords no tenderness along patient's calf.  Skin:    General: Skin is warm and dry.  Neurological:     Mental Status: He is alert.  Psychiatric:        Mood and Affect: Mood normal.    ED Results / Procedures / Treatments   Labs (all labs ordered are listed, but only abnormal results are displayed) Labs Reviewed  BASIC METABOLIC PANEL - Abnormal; Notable for the following components:      Result Value   Glucose, Bld 151 (*)    Calcium 8.6 (*)    All other components within normal limits  CBC - Abnormal; Notable for the following components:   WBC 11.2 (*)    RDW 17.6 (*)    All other components within normal limits  BRAIN NATRIURETIC PEPTIDE - Abnormal; Notable for the following  components:   B Natriuretic Peptide 1,856.0 (*)    All other components within normal limits  TROPONIN I (HIGH SENSITIVITY) - Abnormal; Notable for the following components:   Troponin I (High Sensitivity) 65 (*)    All other components within normal limits  TROPONIN I (HIGH SENSITIVITY) - Abnormal; Notable for the following components:   Troponin I (High Sensitivity) 57 (*)    All other components within normal limits  MAGNESIUM    EKG None  Radiology DG Chest 2 View  Result Date: 02/25/2021 CLINICAL DATA:  Chest pain and shortness of breath for 2 days EXAM: CHEST - 2 VIEW COMPARISON:  07/19/2020 FINDINGS: Cardiac shadow is enlarged. Postsurgical changes are again seen. Lungs are well aerated bilaterally. No focal infiltrate or sizable effusion is seen. Mild vascular congestion is again noted. No interstitial edema is seen. IMPRESSION: Mild vascular congestion stable from the prior exam. Electronically Signed   By: Alcide Clever M.D.   On: 02/25/2021 13:19   US Venous Img Lower Unilateral Right  Result Date: 02/25/2021 CLINICAL DATA:  Edema EXAM: Right LOWER EXTREMITY VENOUS DOPPLER ULTRASOUND TECHNIQUE: Gray-scale sonography with compression, as well as color and duplex ultrasound, were performed to evaluate the deep venous system(s) from the level of the common femoral vein through the popliteal and proximal calf veins. COMPARISON:  None. FINDINGS: VENOUS Normal compressibility of the right common femoral, superficial femoral, and popliteal veins, as well as the visualized calf veins. Visualized portions of profunda femoral vein and great saphenous vein unremarkable. No filling defects to suggest DVT on grayscale or color Doppler imaging. Doppler waveforms show normal direction of venous flow, normal respiratory plasticity and response to augmentation. Limited views of the contralateral left common femoral vein are unremarkable. OTHER Right lower extremity soft tissue edema. Limitations: none  IMPRESSION: No evidence of right lower extremity DVT. Electronically Signed   By: Allegra Lai M.D.   On: 02/25/2021 14:19    Procedures .Critical Care  Performed by: Carroll SageFaulkner, Joylynn Defrancesco J, PA-C Authorized by: Carroll SageFaulkner, Broghan Pannone J, PA-C   Critical care provider statement:    Critical care time (minutes):  45   Critical care time was exclusive of:  Separately billable procedures and treating other patients   Critical care was necessary to treat or prevent imminent or life-threatening deterioration of the following conditions:  Circulatory failure   Critical care was time spent personally by me on the following activities:  Discussions with consultants, evaluation of patient's response to treatment, examination of patient, ordering and performing treatments and interventions, ordering and review of laboratory studies, ordering and review of radiographic studies, pulse oximetry, re-evaluation of patient's condition and review of old charts   I assumed direction of critical care for this patient from another provider in my specialty: no   .Cardioversion  Date/Time: 02/25/2021 2:44 PM Performed by: Carroll SageFaulkner, Rainier Feuerborn J, PA-C Authorized by: Carroll SageFaulkner, Earvin Blazier J, PA-C   Consent:    Consent obtained:  Verbal   Consent given by:  Patient   Risks discussed:  Cutaneous burn, death, induced arrhythmia and pain   Alternatives discussed:  No treatment, rate-control medication, alternative treatment, referral, delayed treatment and observation Pre-procedure details:    Cardioversion basis:  Emergent   Rhythm:  Atrial flutter   Electrode placement:  Anterior-posterior Patient sedated: Yes. Refer to sedation procedure documentation for details of sedation.  Attempt one:    Cardioversion mode:  Synchronous   Waveform:  Monophasic   Shock (Joules):  100   Shock outcome:  Conversion to normal sinus rhythm Post-procedure details:    Patient status:  Awake   Patient tolerance of procedure:  Tolerated  well, no immediate complications .Sedation  Date/Time: 02/25/2021 2:45 PM Performed by: Carroll SageFaulkner, Jahmarion Popoff J, PA-C Authorized by: Carroll SageFaulkner, Dempsey Knotek J, PA-C   Consent:    Consent obtained:  Verbal   Consent given by:  Patient   Risks discussed:  Allergic reaction, dysrhythmia, prolonged hypoxia resulting in organ damage, prolonged sedation necessitating reversal, inadequate sedation, respiratory compromise necessitating ventilatory assistance and intubation, nausea and vomiting   Alternatives discussed:  Analgesia without sedation, anxiolysis and regional anesthesia Universal protocol:    Immediately prior to procedure, a time out was called: yes   Indications:    Procedure performed:  Cardioversion   Procedure necessitating sedation performed by:  Physician performing sedation Pre-sedation assessment:    Time since last food or drink:  Unknown   ASA classification: class 2 - patient with mild systemic disease     Mouth opening:  3 or more finger widths   Mallampati score:  II - soft palate, uvula, fauces visible   Neck mobility: normal     Pre-sedation assessments completed and reviewed: pre-procedure airway patency not reviewed, pre-procedure cardiovascular function not reviewed, pre-procedure hydration status not reviewed, pre-procedure mental status not reviewed, pre-procedure nausea and vomiting status not reviewed, pre-procedure pain level not reviewed and pre-procedure respiratory function not reviewed   Immediate pre-procedure details:    Reassessment: Patient reassessed immediately prior to procedure     Reviewed: vital signs     Verified: bag valve mask available, emergency equipment available, intubation equipment available, IV patency confirmed, oxygen available and reversal medications available   Procedure details (see MAR for exact dosages):    Preoxygenation:  Room air   Sedation:  Propofol   Intended level of sedation: deep   Analgesia:  None   Intra-procedure  monitoring:  Blood pressure monitoring, cardiac monitor, continuous pulse oximetry, frequent vital sign checks  and frequent LOC assessments   Intra-procedure events: none     Total Provider sedation time (minutes):  2 Post-procedure details:    Recovery: Patient returned to pre-procedure baseline     Patient is stable for discharge or admission: yes     Procedure completion:  Tolerated well, no immediate complications   Medications Ordered in ED Medications  metoprolol tartrate (LOPRESSOR) injection 5 mg (5 mg Intravenous Given 02/25/21 1401)  sodium chloride 0.9 % bolus 500 mL (0 mLs Intravenous Stopped 02/25/21 1442)  propofol (DIPRIVAN) 10 mg/mL bolus/IV push 40 mg (40 mg Intravenous Given 02/25/21 1427)  furosemide (LASIX) injection 40 mg (40 mg Intravenous Given 02/25/21 1547)  ondansetron (ZOFRAN-ODT) disintegrating tablet 4 mg (4 mg Oral Given 02/25/21 1727)    ED Course  I have reviewed the triage vital signs and the nursing notes.  Pertinent labs & imaging results that were available during my care of the patient were reviewed by me and considered in my medical decision making (see chart for details).    MDM Rules/Calculators/A&P                          Initial impression-patient presents with chest pain and leg swelling.  He is alert, does not appear acute stress, vital signs noted for tachycardia.  Suspect atrial flutter, will start him on fluids, obtain basic lab work-up, provide a small dose of Lopressor consult with cardiology for further recommendations.  Work-up-CBC shows slight leukocytosis 11.2.  Magnesium 1.9, DVT study is negative, chest ray shows mild stable vascular congestion  Reassessment-updated patient on recommendation from cardiology he is in agreement this plan.  Will obtain consent, and gather appropriate resources i.e. nursing staff, respiratory therapy, crash cart and proceed with procedure.  With Dr. Estell Harpin present timeout was performed patient identified,  patient was given 40 mg of propofol and synchronized 100 J shock was performed patient converted is now sinus rhythm blood pressure mains stable.  We will continue to monitor.  Patient reassessed, no complaints , vital signs reassuring, patient is ambulating without difficulty tolerating PO.  Patient agreeable for discharge.   Consult spoke with Dr. Diona Browner of cardiology he recommends cardioverting the patient, if successful rates are controlled no other abnormalities patient can be discharged home with close follow-up with the A. fib clinic.  If patient cannot be converted or heart rate cannot be controlled do not be admitted for further evaluation.  Rule out- I have low suspicion for ACS as history is atypical , EKG was sinus rhythm without signs of ischemia.  Patient does have an elevated troponin but it is downtrending, I suspect it is elevated secondary due to demand ischemia.  Low suspicion for PE as presentation is atypical of etiology.  He was noted be tachycardic endorse shortness of breath but this is all since resolved after he was converted to sinus rhythm, likely this was secondary due to atrial flutter patient is also on anticoagulant.  I have low suspicion for DVT -DVT study is negative.  Low suspicion for AAA or aortic dissection as history is atypical, patient has low risk factors.  I have low suspicion for severe CHF exacerbation requiring admission diuresis as he has no new oxygen requirements, lung sounds were clear bilaterally, ambulating without difficulty.  Likely increase in BNP is from being in A. fib suspect this will decrease now that he is in sinus rhythm.  low suspicion for systemic infection as patient is nontoxic-appearing, vital  signs reassuring, no obvious source infection noted on exam.   Plan-  Chest pain since resolved-likely secondary due to a flutter patient is currently in sinus rhythm, will have him continue with his home medications, follow-up with the A. fib  clinic for further evaluation. Right leg swelling-likely exacerbated due to atrial flutter, will have him continue with his Lasix, follow-up with cardiology for further evaluation.  Vital signs have remained stable, no indication for hospital admission.  Patient discussed with attending and they agreed with assessment and plan.  Patient given at home care as well strict return precautions.  Patient verbalized that they understood agreed to said plan.  Final Clinical Impression(s) / ED Diagnoses Final diagnoses:  Typical atrial flutter (HCC)  Leg edema, left    Rx / DC Orders ED Discharge Orders          Ordered    ondansetron (ZOFRAN) 4 MG tablet  Every 6 hours        02/25/21 1824             Carroll Sage, PA-C 02/25/21 1825    Bethann Berkshire, MD 02/28/21 1022

## 2021-02-25 NOTE — ED Notes (Signed)
Zoll pads placed on pt, pt consent done

## 2021-02-25 NOTE — Telephone Encounter (Signed)
New Message    Patient mother called he is sitting in lobby of ER , can you please call and have them admit him or at least check him , she thinks he may be having another heart attack

## 2021-02-25 NOTE — Discharge Instructions (Addendum)
You were in a flutter we have converted back to sinus rhythm please continue with all home medication as prescribed.  It is noted that you have fluid in your lungs please continue take your Lasix as prescribed.  I want you to follow-up with Dr. Wyline Mood  further evaluation please call for an evaluation.   Come back to the emergency department if you develop chest pain, shortness of breath, severe abdominal pain, uncontrolled nausea, vomiting, diarrhea.

## 2021-02-25 NOTE — Sedation Documentation (Addendum)
Respiratory  assisting with maintaining pt's sats, pt very drowsy but will respond with verbal stimuli

## 2021-02-25 NOTE — Telephone Encounter (Signed)
Spoke with pt who states that his legs are swollen and have been for the last week. Pt c/o SOB and chest pain. Pt states that he is on the way to the ER to admit himself. Please advise.

## 2021-02-25 NOTE — ED Notes (Signed)
Pt ambulated about 100 feet out in the hallway on RA, sats remained 98-100% with ambulation and without difficultly

## 2021-02-25 NOTE — Telephone Encounter (Signed)
Patient mother is returning call about her son, he is at [facility]

## 2021-02-25 NOTE — Telephone Encounter (Signed)
New message    Pt c/o swelling: STAT is pt has developed SOB within 24 hours  If swelling, where is the swelling located? legs  How much weight have you gained and in what time span? Doesn't know  Have you gained 3 pounds in a day or 5 pounds in a week? Doesn't know  Do you have a log of your daily weights (if so, list)? no  Are you currently taking a fluid pill? yes  Are you currently SOB? yes  Have you traveled recently? No  Patient wants you to admit him to the hospital , he is struggling to breath and can not deal with the swelling

## 2021-02-25 NOTE — ED Triage Notes (Signed)
Pt c/o cp that started 2 days ago, swelling in legs that started 1 week ago.

## 2021-02-26 NOTE — Telephone Encounter (Signed)
Spoke with pt who states that he was seen in the ER on yesterday. Pt now requesting fu appt. Appt made for 03/07/21 with Nena Polio, NP.

## 2021-03-06 NOTE — Progress Notes (Deleted)
Cardiology Office Note  Date: 03/06/2021   ID: Rankin, November 12/16/1985, MRN [id number]  PCP:  Patient, No Pcp Per (Inactive)  Cardiologist:  Dina Rich, MD Electrophysiologist:  None   Chief Complaint: ED follow-up.  Cardioversion in the ED  History of Present Illness: Donald [name] is a 35 y.o. male with a history of chronic systolic heart failure, anemia, DVT, mitral regurgitation, tricuspid regurgitation, MI, PAF, tricuspid regurgitation, CAD, MI.  He was last seen by Dr. Wyline Mood on 10/18/2020 for follow-up chronic systolic CHF, mild mitral regurgitation, atrial flutter.  History of severe MR with biventricular failure being considered for mitral valve surgery by Dr. Cornelius Moras.  Plan was to increase Toprol to 50 mg daily, continue Entresto.  He had an upcoming heart failure clinic appointment.  Amiodarone was started at 200 mg p.o. twice daily x4 weeks then 200 mg daily.  He was to continue Eliquis and stop aspirin.  EKG on that visit showed normal sinus rhythm.   He presented to ED on 02/25/2021 with complaints of chest pain x2 days.  He stated chest pain started suddenly and stated it was coming from his heart beating very quickly.  He described fluttering in his chest as constant.  Pain did not radiate.  Not associated with diaphoresis, nausea, vomiting, lightheadedness, dizziness.  Stated he felt slightly short of breath but this was only with exertion.  Having some worsening swelling in his right leg beginning 1 week prior.  ER providers note mentioned patient had recently undergone cardioversion in March after given a 200 J shock and cardioverted back to normal sinus rhythm.  ER provider spoke with Dr. Diona Browner and he recommended cardioverting the patient.  He was given 40 mg of propofol and synchronized shock at 100 J and converted back to normal sinus rhythm.  Dr. Diona Browner recommended close follow-up with atrial fibrillation clinic.  Right leg swelling was likely  exacerbated due to atrial flutter.  He was to continue with his Lasix and follow-up with cardiology.  Troponin elevations were thought to be related to increased atrial flutter rate.  BNP was 1856.  He received Lasix 40 mg.  Lower venous study showed no evidence of DVT on the right.     Past Medical History:  Diagnosis Date   Anemia    Autoimmune disorder (HCC)    pyoderma gangrenosum   CHF (congestive heart failure) (HCC)    Chronic systolic heart failure (HCC)    a. EF 35-40% by echo in 07/2018 b. EF at 45% by repeat echo in 04/2020   DVT (deep venous thrombosis) (HCC)    h/o   Dysrhythmia    Mitral regurgitation    a. 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 b. 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HCC)    arthritis   Tricuspid regurgitation     Past Surgical History:  Procedure Laterality Date   CARDIAC CATHETERIZATION     HERNIA REPAIR Right 2018   MITRAL VALVE REPAIR  06/07/2018   [facility] - Dr Meda Klinefelter   MULTIPLE EXTRACTIONS WITH ALVEOLOPLASTY N/A 11/08/2020   Procedure: EXTRACTION OF TEETH NUMBER ONE, FIFTHTEEN, SIXTEEN, SEVENTEEN, EIGHTTEEN AND THRTY WITH ALVEOLOPLASTY OF LOWER LEFT QUADRANT.;  Surgeon: Sharman Cheek, DMD;  Location:  [facility] OR;  Service: Dentistry;  Laterality: N/A;   RIGHT/LEFT HEART CATH AND CORONARY ANGIOGRAPHY N/A 09/11/2020   Procedure: RIGHT/LEFT HEART CATH AND CORONARY ANGIOGRAPHY;  Surgeon: Dolores Patty, MD;  Location: [facility] INVASIVE CV LAB;  Service: Cardiovascular;  Laterality: N/A;   TEE WITHOUT CARDIOVERSION N/A 05/23/2020   Procedure: TRANSESOPHAGEAL ECHOCARDIOGRAM (TEE) WITH PROPOFOL;  Surgeon: [name], MD;  Location: [facility] ENDO SUITE;  Service: Endoscopy;  Laterality: N/A;    Current  Outpatient Medications  Medication Sig Dispense Refill   amiodarone (PACERONE) 200 MG tablet Take 1 tablet (200 mg total) by mouth daily. Take 1 tablet 2 times daily for four weeks, then decrease to 1 tablet once daily 146 tablet 2   apixaban (ELIQUIS) 5 MG TABS tablet Take 5 mg by mouth in the morning.     ferrous sulfate 325 (65 FE) MG tablet Take 1 tablet (325 mg total) by mouth daily. (Patient taking differently: Take 325 mg by mouth in the morning.) 90 tablet 1   furosemide (LASIX) 40 MG tablet Take 1 tablet (40 mg total) by mouth 2 (two) times daily. (Patient taking differently: Take 80 mg by mouth in the morning.) 180 tablet 3   metoprolol succinate (TOPROL-XL) 50 MG 24 hr tablet Take 1 tablet (50 mg total) by mouth daily. Take with or immediately following a meal. (Patient taking differently: Take 50 mg by mouth in the morning. Take with or immediately following a meal.) 90 tablet 3   ondansetron (ZOFRAN) 4 MG tablet Take 1 tablet (4 mg total) by mouth every 6 (six) hours. 12 tablet 0   potassium chloride SA (KLOR-CON) 20 MEQ tablet Take 1 tablet (20 mEq total) by mouth 2 (two) times daily. (Patient taking differently: Take 20-40 mEq by mouth in the morning.) 60 tablet 0   sacubitril-valsartan (ENTRESTO) 24-26 MG Take 1 tablet by mouth 2 (two) times daily. 60 tablet 3   No current facility-administered medications for this visit.   Allergies:  Patient has no known allergies.   Social History: The patient  reports that he has quit smoking. His smoking use included cigarettes. He has never used smokeless tobacco. He reports that he does not drink alcohol and does not use drugs.   Family History: The patient's family history includes Depression in his father; Diabetes in his father and paternal grandmother; Multiple sclerosis in his mother; Psoriasis in his mother.   ROS:  Please see the history of present illness. Otherwise, complete review of systems is positive for none.  All other systems  are reviewed and negative.   Physical Exam: VS:  There were no vitals taken for this visit., BMI There is no height or weight on file to calculate BMI.  Wt Readings from Last 3 Encounters:  02/25/21 200 lb (90.7 kg)  11/08/20 206 lb (93.4 kg)  10/18/20 217 lb (98.4 kg)    General: Patient appears comfortable at rest. HEENT: Conjunctiva and lids normal, oropharynx clear with moist mucosa. Neck: Supple, no elevated JVP or carotid bruits, no thyromegaly. Lungs: Clear to auscultation, nonlabored breathing at rest. Cardiac: Regular rate and rhythm, no S3 or significant systolic murmur, no pericardial rub. Abdomen: Soft, nontender, no hepatomegaly, bowel sounds present, no guarding or rebound. Extremities: No pitting edema, distal pulses 2+. Skin: Warm and dry. Musculoskeletal: No kyphosis. Neuropsychiatric: Alert and oriented x3, affect grossly appropriate.  ECG:  {EKG/Telemetry Strips [phone]}  Recent Labwork: 02/25/2021: B Natriuretic Peptide 1,856.0; BUN 16; Creatinine, Ser 1.02; Hemoglobin 15.7; Magnesium  1.9; Platelets 228; Potassium 4.1; Sodium 135  No results found for: CHOL, TRIG, HDL, CHOLHDL, VLDL, LDLCALC, LDLDIRECT  Other Studies Reviewed Today:   RIGHT/LEFT HEART CATH AND CORONARY ANGIOGRAPHY 09/11/2020   Conclusion  Findings:   RA = 7 RV = 49/10 PA = 48/16 (32) PCW = 17 (v=32) Fick cardiac output/index = 5.3/2.5 PVR = 2.9 WU Ao sat = 99% PA sat = 71%, 70% High SVC sat =  75%   Assessment:   1. Normal coronary arteries 2. NICM EF 30-35% 3. Severe MR with prominent v-waves in PCWP tracing 4. Mild pulmonary venous HTN with normal CO   Plan/Discussion:   F/u with Dr. Cornelius Moras for possible MV replacement.        06/06/18 Echo [facility] SUMMARY Mild left ventricular hypertrophy The left ventricle is mildly dilated. The apex is contracting normally. The rest of LV wall is hypokinetic. LV ejection fraction = 35-40%.  Left ventricular systolic  function is moderately reduced. The right ventricle is normal in size and function. There is severe mitral regurgitation. There is mild mitral valve thickening. Suspect anterior mitral valve prolapse. Recommend TEE for further evaluation. Normal IVC size with decreased respiratory collapse. There is no pericardial effusion. There is no comparison study available.     06/06/2018 TEE [facility] SUMMARY There is severe prolapse (possible flail) of A3 scallop resulting in severe,  posteriorly directed regurgitation with a 0.5 cm gap in closure. Pulmonary  vein flow shows systolic reversal. Papillary muscles appear intact. No  vegetations seen. The left ventricular size is normal.  Left ventricular systolic function is mildly reduced. The right ventricle is normal in size and function. Diffuse thickening of the aortic valve with preserved cusp opening. There is  an extremely small ( <2 mm) echodensity on the ventricular side of the aortic  valve--no significant aortic regurgitation; recommend clinical correlation. There is mild tricuspid regurgitation. No overt vegetations identified. 3D Echocardiography was performed and reviewed for assessment of cardiac  structure and function using a workstation. Findings were integrated into the  echo report.   06/07/18 Intraop TEE Pre-Intervention Summary An Omniplane TEE probe was inserted orally. A full TEE exam was performed  including 2D, M-Mode, Color and Doppler. The TEE probe was placed  atraumatically on the first attempt. The left ventricle is severely dilated. There is normal left ventricular wall  thickness. Left ventricular systolic function is moderately reduced. The EF  is 30-35%. There is mild to moderate global hypokinesis of the left  ventricle. There is moderate to severe inferior wall hypokinesis. The right ventricle is moderate to severely dilated. The right ventricular  systolic function is mildly reduced. The interatrial septum  is intact with no evidence for an atrial septal  defect. The left atrium is severely dilated. Right atrial size is normal. There is a ruptured chordae with flail of the A3 segment of the anterior  mitral leaflet. No significant mitral valve stenosis. There is severe mitral  regurgitation. Flow reversal noted in pulmonary veins consistent with  significant mitral regurgitation. The mitral regurgitant jet is posteriorly  directed, which is consistent with anterior leaflet pathology. The tricuspid valve is normal in structure and function. There is trace  tricuspid regurgitation. The aortic valve is trileaflet. The aortic valve opens well. There is a small  irregularity on the left coronary cusp adjacent to the right coronary cusp.  Cannot exclude aortic valvular vegetation. No hemodynamically significant  valvular aortic stenosis. Trace to mild AI with central jet. The pulmonic  valve is normal in structure and function. > The aortic root is normal size. No obvious dissection could be visualized.  There is no pericardial effusion. There is no pleural effusion.  Post-Intervention Summary The EF remains 30-35%. There is moderate to severe inferior wall hypokinesis. The right ventricular systolic function is normal. The interatrial septum is intact with no evidence for an atrial septal defect. There is no mitral regurgitation noted. An annuloplasty ring is noted in the  mitral position. There is trace tricuspid regurgitation. Trace to mild AI. The pulmonic valve is not well visualized. No obvious dissection could be visualized. There is no pericardial effusion.  There is no pleural effusion. MMode/2D Measurements & Calculations EDV(MOD-sp4): 72.3 ml ESV(MOD-sp4): 117.0 ml EDV(MOD-sp2): 257.0 ml ESV(MOD-sp2): 150.0 ml SV(MOD-sp4): -44.7 ml Pre-Intervention Doppler Measurements and Calculations MV max PG: 11.1 mmHg MV mean PG: 4.8 mmHg Ao V2 max: 87.0 cm/sec Ao max PG: 3.0 mmHg Ao V2  mean: 51.9 cm/sec Ao mean PG: 1.4 mmHg Ao V2 VTI: 13.4 cm   Overall TEE Interpretation Summary Pre-intervention exam: The LV is severey dilated with mild to moderate global  hypokinesis. The EF is 30-35%. There is moderate to severe hypokinesis of the  inferior wall. The RV systolic function is mildly reduced. There is a  ruptured chordae and flail of the A3 segment of the anterior mitral leaflet.  The posterior leaflet appears normal. Severe MR with posteriorly directed jet  and systolic flow reversal in the pulmonary veins. There is a small echodense  irregularity on the left coronary cusp of the aortic valve adjacent to the  right coronary cusp. Vegetation cannot be ruled out. There is trace to mild  AI with a central jet. There is trace TR. There is no obvious aortic  dissection. There is no pericardial effusion. There is no pleural effusion. Post-intervention exam: The LV EF remains unchanged from the pre-intervention  exam. The RV systolic function has improved and is now normal. There is an  annuloplasty ring in the mitral position. There is no MR. Otherwise the exam  is unchanged from the pre-intervention exam. At the conclusion of the  procedure the probe was removed. There were no apparant complications related  to the TEE examination.     Jun 18 2018 echo SUMMARY There is normal left ventricular wall thickness. The left ventricle is mildly dilated.  Left ventricular systolic function is mildly reduced. LV ejection fraction = 40-45%. Abnormal (paradoxical) septal motion consistent with post-operative status. There is hypokinesis of the basal to mid inferior wall. The right ventricle is normal in size and function. The left atrial size is normal. An annuloplasty ring is noted in the mitral position. The mean gradient across the mitral valve is 7.6 mmHg. There is no mitral regurgitation noted. The aortic sinus is normal in size. IVC size was normal. There is no  pericardial effusion. Compared to the last surface echo dated 06/06/18, the LV systolic function is  slightly improved and the patient is s/p mitral valve repair.   Jul 28 2018 TTE SUMMARY The left ventricle is mildly dilated.  Left ventricular systolic function is moderately reduced. LV ejection fraction = 35-40%. The right ventricle is normal size. The right ventricular systolic function is mildly reduced. The left atrium is mildly dilated. There is mild aortic regurgitation. Status post mitral valve repair with annuloplasty ring There is mild to moderate mitral regurgitation. The mean gradient across the mitral valve is 9.8 mmHg. The heart rate for the mean mitral  valve gradient is 92 BPM. There is mild tricuspid regurgitation. There was insufficient TR detected to calculate RV systolic pressure. Estimated right atrial pressure is 10 mmHg.Marland Kitchen There is no pericardial effusion.     Jul 28 2018 TEE - SUMMARY s/p Mitral valve repair with resection of ruptured anterior papillary muscle,  reconstruction of papillary chord with two Neo-chords and placement of  Simplici-T Annuloplasty ring. Noted a mobile echodensity on the posterior leaflet of the mitral valve,  which may represents surgical suture. Differential include torn mitral valve  chordae or vegetation. Compared to the post-op TEE, MR appears worse.  Clinical correlation advised. There is mild aortic regurgitation. A mobile echodensity noted on the left coronary cusp of the aortic valve  which was seen in the previous TEEs. Noted mild eccentric AI.     Jan 2020 CT scan 1. Soft tissue density in the anterior mediastinum adjacent to the median sternotomy which may simply be postoperative, but cannot exclude cellulitis. No drainable fluid collection. 2. Trace hydropneumopericardium as well as small locules of air in the anterior mediastinum inferiorly, most likely postoperative in nature. 3. Significant improved aeration when  compared to prior study with residual atelectasis/scarring in the right lower lobe. There is residual airspace consolidation left lower lobe consistent with resultant discoid atelectasis or pneumonia. 4. . Filling defect in the right jugular vein extending into the right brachiocephalic vein as well as a more broad-based filling defect in the left brachiocephalic vein, consistent with thrombus. 5. Interval sternotomy and mitral valve replacement with expected postoperative changes.     06/07/18 surgery note  PREOPERATIVE DIAGNOSIS: ICD-10 I34.0 Mitral valve insufficiency  POSTOPERATIVE DIAGNOSIS: same  QUALITY MEASURES: 4047F Documentation of order for prophylactic antibiotics to be given within one hour (if fluoroquinolone or vancomycin, two hours) prior to surgical incision (or start of procedure when no incision is required) 4041F Documentation of order for cefazolin or cefuroxime for antimicrobial prophylaxis  HEMODYNAMICS AND CATH: Ejection fraction: 35%  VALVE DATA: Mitral Valve: Severe Insufficiency  PRIORITY: Emergent  INCIDENCE: First cardiovascular surgery   PROCEDURE: CPT Codes: 08676 * Mitral valve repair with resection of ruptured anterior papillary muscle, reconstruction of papillary chord with two Neo-chords and placement of Simplici-T Annuloplasty ring via superior septal approach Model: 670 Serial: [id number]  CARDIOPULMONARY BYPASS TIME: 131 min  AORTIC CROSS CLAMP TIME: 92 min  DESCRIPTION OF OPERATION After induction of anesthesia, the patient was prepped and draped. Before the incision was made, time-out was observed by all members of the surgical and anesthesia teams to identify the correct patient and procedure. The heart was exposed via a full conventional sternotomy. The anatomy of the heart and great vessels was normal. On palpation the aorta was normal.   Intraoperative transesophageal echocardiography showed the left ventricular function to be  hypokinetic with an ejection fraction of 35%. Exam of the cardiac valves showed severe mitral regurgitation with anterior A2/A3 leaflet prolapse.  After heparinization, cardiopulmonary bypass was instituted. Arterial perfusion was via an arterial cannula placed in the aorta. For assisted venous return, drainage was from two cannuli placed through the right atrium into the superior and inferior vena cavi. Caval tapes were placed. The patient was cooled to a body core temperature of 36 degrees centigrade. The left ventricle was vented through the both aorta and right superior pulmonary vein.  The ascending aorta was cross-clamped and the heart was arrested with cold DelNido cardioplegia given antegrade. Once dose was adequate for the aforementioned cross clamp time. Topical cooling  was used.  The mitral valve was exposed via a superior septal approach incision. The left atrium was carefully examined. There was no clot present. Retraction sutures were placed and the valve was well visualized. The valve was bicuspid and was diseased from an apparent degenerative etiology. The calcification of the leaflets was none and no calcification of the annulus. The valve was inspected and the defect found to be ruptured anterior papillary muscle causing the A2/A3 prolapse. The anterior leaflet was thickened. There were no vegetations or stigmata of endocarditis. The valve was amenable to repair.  The ruptured papillary muscle was resected. The chord was then reconstructed utilizing two 5-0 Neo-chords. The valve was statically tested and was found to be competent without leak.  Thirteen sutures of 2-0 dacron were placed in simple mattress fashion in the annulus from trigone to trigone posteriorly. A Medtronic Simplici T band was chosen and the sutures passed through the band. The band was seated and the sutures secured via CorKnots. The valve was again statically tested and was found to have a small leak at the medial  commissure. A commisuroplasty was performed utilizing 5-0 was performed. The valve then appeared to be hydrostatic.   The left ventricular vent was placed across the mitral valve. The left atrial and interatrial septal portions of the incisions were closed with running 4-0 Prolene. An aortic vent was placed to low suction and the cross clamp released. The right atrial portion of the incision was closed with a 4-0 Prolene suture. Caval tapes were released.  The patient was rewarmed. Cardioversion occurred spontaneously. The underlying rhythm was normal sinus. Atrial and ventricular pacing was used. The patient was weaned and separated from cardiopulmonary bypass. After discontinuation of cardiopulmonary bypass the heart adequately supported the circulation. Inotropes were used upon leaving the operating room. The left ventricular function was moderatly hypokinetic. The right ventricular function was mildly hypokinetic. Post procedure intraoperative transesophageal echocardiogram demonstrated an ejection fraction of 35% and there was well-seated annuloplasty ring with no mitral regurgitation.  The bypass lines were removed and the Heparin reversed with Protamine. The pericardium was drained with one 24 Fr. Blake drain. The pleural space did not require a chest tube. The sternum was reapproximated with #7 and double stranded stainless steel wires. The linea alba was closed with #2 absorbable sutures. The pectoralis fascia was closed with #0 absorbable sutures. Subcutaneous tissues were closed with 2-0 absorbable suture. The skin was closed with 3-0 absorbable subcuticular suture. Sterile dressings were placed over the wounds.  There were no complications and sponge, instrument, and needle counts were reported as correct. The patient was transported to the Intensive Care Unit in stable condition.      05/2020 echo IMPRESSIONS     1. Left ventricular ejection fraction, by estimation, is 40 to 45%. The   left ventricle has mildly decreased function.   2. Right ventricular systolic function moderately to severely reduced.  The right ventricular size is moderately enlarged.   3. Left atrial size was severely dilated. No left atrial/left atrial  appendage thrombus was detected. The LAA emptying velocity was 60 cm/s.   4. Right atrial size was severely dilated.   5. Difficult visualization of mitral valve and regurgitant jet. Findings  consistent with prior repair. Thickened leaflets with restricted motion.  The MR jet was not amenable to PISA. Moderate mean gradient across the  valve of 6 mmHg. In images #[id number]  there appears to be regurgitation jet through the valve but also lateral  to the anular ring. The cumulative regurgitation appears to be severe. .  The mitral valve has been repaired/replaced. Severe mitral valve  regurgitation. Moderate mitral stenosis.   6. The tricuspid valve is abnormal. Tricuspid valve regurgitation is  moderate to severe.   7. The aortic valve is tricuspid. Aortic valve regurgitation is not  visualized. No aortic stenosis is present.   8. Technically difficult study, standard views were off axis particularly  of the mitral valve.      08/2020 RHC/LHC Assessment:   1. Normal coronary arteries 2. NICM EF 30-35% 3. Severe MR with prominent v-waves in PCWP tracing 4. Mild pulmonary venous HTN with normal CO   Assessment and Plan:  1. Atrial flutter, unspecified type (HCC)   2. Severe mitral regurgitation   3. Chronic systolic heart failure (HCC)      Medication Adjustments/Labs and Tests Ordered: Current medicines are reviewed at length with the patient today.  Concerns regarding medicines are outlined above.   Disposition: Follow-up with ***  Signed, Rennis Harding, NP 03/06/2021 4:18 PM    [facility] [address] [place], [place], [place] [address] Phone: [phone]; Fax: [phone]

## 2021-03-07 ENCOUNTER — Ambulatory Visit: Payer: Medicaid Other | Admitting: Family Medicine

## 2021-03-07 DIAGNOSIS — I340 Nonrheumatic mitral (valve) insufficiency: Secondary | ICD-10-CM

## 2021-03-07 DIAGNOSIS — I4892 Unspecified atrial flutter: Secondary | ICD-10-CM

## 2021-03-07 DIAGNOSIS — I5022 Chronic systolic (congestive) heart failure: Secondary | ICD-10-CM

## 2021-03-18 ENCOUNTER — Other Ambulatory Visit: Payer: Self-pay

## 2021-03-18 ENCOUNTER — Ambulatory Visit (INDEPENDENT_AMBULATORY_CARE_PROVIDER_SITE_OTHER): Payer: Self-pay | Admitting: Cardiology

## 2021-03-18 ENCOUNTER — Encounter: Payer: Self-pay | Admitting: Cardiology

## 2021-03-18 VITALS — BP 124/100 | HR 78 | Ht 69.0 in | Wt 227.0 lb

## 2021-03-18 DIAGNOSIS — I5022 Chronic systolic (congestive) heart failure: Secondary | ICD-10-CM

## 2021-03-18 DIAGNOSIS — Z79899 Other long term (current) drug therapy: Secondary | ICD-10-CM

## 2021-03-18 DIAGNOSIS — I340 Nonrheumatic mitral (valve) insufficiency: Secondary | ICD-10-CM

## 2021-03-18 DIAGNOSIS — I4892 Unspecified atrial flutter: Secondary | ICD-10-CM

## 2021-03-18 MED ORDER — APIXABAN 5 MG PO TABS: 5.0000 mg | ORAL_TABLET | Freq: Two times a day (BID) | ORAL | 6 refills | Status: DC

## 2021-03-18 MED ORDER — FUROSEMIDE 20 MG PO TABS: 20.0000 mg | ORAL_TABLET | Freq: Every day | ORAL | 1 refills | Status: DC

## 2021-03-18 MED ORDER — SPIRONOLACTONE 25 MG PO TABS: 12.5000 mg | ORAL_TABLET | Freq: Every day | ORAL | 1 refills | Status: DC

## 2021-03-18 MED ORDER — FUROSEMIDE 80 MG PO TABS: 80.0000 mg | ORAL_TABLET | Freq: Every day | ORAL | 1 refills | Status: DC

## 2021-03-18 NOTE — Addendum Note (Signed)
Addended by: Eustace Moore on: 03/18/2021 12:19 PM   Modules accepted: Orders

## 2021-03-18 NOTE — Patient Instructions (Addendum)
Medication Instructions:  Your physician has recommended you make the following change in your medication:  Start spironolactone 12.5 mg daily Increase furosemide to 100 mg daily (80 mg with 20 mg tablet) Start taking eliquis 5 mg twice daily Continue other medications the same  Labwork: BMET & Mg in 2 weeks-[facility]  Testing/Procedures: none  Follow-Up: Your physician recommends that you schedule a follow-up appointment in: 2 months  Any Other Special Instructions Will Be Listed Below (If Applicable).  If you need a refill on your cardiac medications before your next appointment, please call your pharmacy.

## 2021-03-18 NOTE — Progress Notes (Signed)
Clinical Summary Mr. Faraone is a 35 y.o.male seen today for follow up of the following medical problems.    1. Mitral regurgitation - admit 05/2018 with acute respiratory failure. From notes had cardaic arrest with induction for intubation.  - TTE done showed severe MR - TEE showed severe prolapse and possible flail A3 scallop with severe MR.    - 06/07/2018 went to OR for MV repair with resection of ruptured anterior papillary muscle, reconstruction of papillary chord with two Neo-chords and placement of Simplici-T Annuloplasty ring via superior septal approach - Intraop/Postop TEE reports no MR initially after surgery     07/2018 TEE during repeat admission mobile echodensity posterior leaflet, possible surgical suture, vs torn chordae vs vegetation. The MR is not reported regardign severity, ERO 0.93 would suggest severe, unclear  - CT surgery at that time recommended continued medical therapy, reconsider repeat intervention at ouptatient followup - thought perhaps an autoimmune disease may have causes some myxomatous degeneration. Undergoing rheum evaluation     - 04/2020 echo LVEF 50%, severe BAE, MV/AV VTI is 3 suggesting possible severe MR.    05/2020 TEE: Severe MR, LVEF 40-45%, mod to severe RV dysfunction - referred to Dr Cornelius Moras, considering MV replacement     -he has had dentral evaluation and extration and has been evaluated by CHF clinic - overdue for follow up with CT surgery             2. Chronic systolic HF - readmitted 07/2018 with volume overload - multiple TTE's and TEEs as reported below. Last LVEF 35-40% by TTE in 07/2018, LVIDd 5.6 and LVIDs 4.3, mild LAE.   - discharged on Lasix 40mg  daily, lisinopril 5mg , TOprol 25mg , ASA 81, atorva 10,      - recent issues with fluid overload over the last few months. Partly due to some missed lasix doses. Had an ER visit with pulm edema, did not require admission 04/2020 echo LVEF 50%, severe BAE, MV/AV VTI is 3  suggesting possible severe MR.      ER visit 04/2020 with volume overload, managed in ER with diuresis.  - admit 05/18/20 with volume overload, diuresed and dischaged. ProBNP was 7000. K 3.3 Cr 1. CXR with pulm edema - presented with abdominal distension, SOB.Had been compliant lasix 40mg  bid.      08/2020 LHC/RHC Normal coronaries, LVEF 30-35%. CI 2.48 Mean PA 32 PCWP17 LVEDP 8 - followed in CHF clinic. Recently changed to entresto     - some recent SOB, orthopnea, LE edema - taking lasix 80mg  once daily. Difficult to take bid dosing due to his job    3. 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    4. 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 Atrial flutter - several episodes in setting of severe LAE     - seen in ER 02/25/2021 with aflutter with RVR - had DCCV - no recent palpitations.           SH: mother is Jaelen Soth, also a patient of mine   Has a sone [name] 1.33 yo.  Works at [employer], works 3pm to 1130pm.  Past Medical History:  Diagnosis Date   Anemia    Autoimmune disorder (HCC)    pyoderma gangrenosum   CHF (congestive heart failure) (HCC)    Chronic systolic heart failure (HCC)    a. EF 35-40% by echo in 07/2018 b. EF at 45% by repeat echo in 04/2020   DVT (deep venous thrombosis) (HCC)    h/o   Dysrhythmia    Mitral regurgitation    a. 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 b. 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   Mitral stenosis    Myocardial infarction (HCC)    Paroxysmal atrial flutter (HCC)    Pyoderma gangrenosa    Seronegative  spondylitis (HCC)    arthritis   Tricuspid regurgitation      No Known Allergies   Current Outpatient Medications  Medication Sig Dispense Refill   amiodarone (PACERONE) 200 MG tablet Take 1 tablet (200 mg total) by mouth daily. Take 1 tablet 2 times daily for four weeks, then decrease to 1 tablet once daily 146 tablet 2   apixaban (ELIQUIS) 5 MG TABS tablet Take 5 mg by mouth in the morning.     ferrous sulfate 325 (65 FE) MG tablet Take 1 tablet (325 mg total) by mouth daily. (Patient taking differently: Take 325 mg by mouth in the morning.) 90 tablet 1   furosemide (LASIX) 40 MG tablet Take 1 tablet (40 mg total) by mouth 2 (two) times daily. (Patient taking differently: Take 80 mg by mouth in the morning.) 180 tablet 3   metoprolol succinate (TOPROL-XL) 50 MG 24 hr tablet Take 1 tablet (50 mg total) by mouth daily. Take with or immediately following a meal. (Patient taking differently: Take 50 mg by mouth in the morning. Take with or immediately following a meal.) 90 tablet 3   ondansetron (ZOFRAN) 4 MG tablet Take 1 tablet (4 mg total) by mouth every 6 (six) hours. 12 tablet 0   potassium chloride SA (KLOR-CON) 20 MEQ tablet Take 1 tablet (20 mEq total) by mouth 2 (two) times daily. (Patient taking differently: Take 20-40 mEq by mouth in the morning.) 60 tablet 0   sacubitril-valsartan (ENTRESTO) 24-26 MG Take 1 tablet by mouth 2 (two) times daily. 60 tablet 3   No current facility-administered medications for this visit.     Past Surgical History:  Procedure Laterality Date   CARDIAC CATHETERIZATION     HERNIA REPAIR Right 2018   MITRAL VALVE REPAIR  06/07/2018   [facility] - Dr Meda Klinefelter   MULTIPLE EXTRACTIONS WITH ALVEOLOPLASTY N/A 11/08/2020   Procedure: EXTRACTION OF TEETH NUMBER ONE, FIFTHTEEN, SIXTEEN, SEVENTEEN, EIGHTTEEN AND THRTY WITH ALVEOLOPLASTY OF LOWER LEFT QUADRANT.;  Surgeon: Sharman Cheek, DMD;  Location: [facility] OR;  Service: Dentistry;  Laterality:  N/A;   RIGHT/LEFT HEART CATH AND CORONARY ANGIOGRAPHY N/A 09/11/2020   Procedure: RIGHT/LEFT HEART CATH AND CORONARY ANGIOGRAPHY;  Surgeon: Dolores Patty, MD;  Location: [facility] INVASIVE CV LAB;  Service: Cardiovascular;  Laterality: N/A;   TEE WITHOUT CARDIOVERSION N/A 05/23/2020   Procedure: TRANSESOPHAGEAL ECHOCARDIOGRAM (TEE) WITH PROPOFOL;  Surgeon: [name], MD;  Location: [facility] ENDO SUITE;  Service: Endoscopy;  Laterality: N/A;     No Known Allergies    Family History  Problem Relation Age of Onset   Multiple sclerosis Mother    Psoriasis Mother    Depression Father    Diabetes Father  Diabetes Paternal Grandmother      Social History Mr. Kraynak reports that he has quit smoking. His smoking use included cigarettes. He has never used smokeless tobacco. Mr. Garriga reports no history of alcohol use.   Review of Systems CONSTITUTIONAL: No weight loss, fever, chills, weakness or fatigue.  HEENT: Eyes: No visual loss, blurred vision, double vision or yellow sclerae.No hearing loss, sneezing, congestion, runny nose or sore throat.  SKIN: No rash or itching.  CARDIOVASCULAR: per hpi RESPIRATORY: per hpi GASTROINTESTINAL: No anorexia, nausea, vomiting or diarrhea. No abdominal pain or blood.  GENITOURINARY: No burning on urination, no polyuria NEUROLOGICAL: No headache, dizziness, syncope, paralysis, ataxia, numbness or tingling in the extremities. No change in bowel or bladder control.  MUSCULOSKELETAL: No muscle, back pain, joint pain or stiffness.  LYMPHATICS: No enlarged nodes. No history of splenectomy.  PSYCHIATRIC: No history of depression or anxiety.  ENDOCRINOLOGIC: No reports of sweating, cold or heat intolerance. No polyuria or polydipsia.  Marland Kitchen   Physical Examination Today's Vitals   03/18/21 1127  BP: (!) 124/100  Pulse: 78  SpO2: 96%  Weight: 227 lb (103 kg)  Height: 5\' 9"  (1.753 m)   Body mass index is 33.52 kg/m.  Gen: resting comfortably,  no acute distress HEENT: no scleral icterus, pupils equal round and reactive, no palptable cervical adenopathy,  CV: RRR, 4/6 systolic murmur apex, no jvd Resp: Clear to auscultation bilaterally GI: abdomen is soft, non-tender, non-distended, normal bowel sounds, no hepatosplenomegaly MSK: extremities are warm, no edema.  Skin: warm, no rash Neuro:  no focal deficits Psych: appropriate affect   Diagnostic Studies 06/06/18 Echo [facility] SUMMARY Mild left ventricular hypertrophy The left ventricle is mildly dilated. The apex is contracting normally. The rest of LV wall is hypokinetic. LV ejection fraction = 35-40%.  Left ventricular systolic function is moderately reduced. The right ventricle is normal in size and function. There is severe mitral regurgitation. There is mild mitral valve thickening. Suspect anterior mitral valve prolapse. Recommend TEE for further evaluation. Normal IVC size with decreased respiratory collapse. There is no pericardial effusion. There is no comparison study available.     06/06/2018 TEE [facility] SUMMARY There is severe prolapse (possible flail) of A3 scallop resulting in severe,  posteriorly directed regurgitation with a 0.5 cm gap in closure. Pulmonary  vein flow shows systolic reversal. Papillary muscles appear intact. No  vegetations seen. The left ventricular size is normal.  Left ventricular systolic function is mildly reduced. The right ventricle is normal in size and function. Diffuse thickening of the aortic valve with preserved cusp opening. There is  an extremely small ( <2 mm) echodensity on the ventricular side of the aortic  valve--no significant aortic regurgitation; recommend clinical correlation. There is mild tricuspid regurgitation. No overt vegetations identified. 3D Echocardiography was performed and reviewed for assessment of cardiac  structure and function using a workstation. Findings were integrated into the  echo report.    06/07/18 Intraop TEE Pre-Intervention Summary An Omniplane TEE probe was inserted orally. A full TEE exam was performed  including 2D, M-Mode, Color and Doppler. The TEE probe was placed  atraumatically on the first attempt. The left ventricle is severely dilated. There is normal left ventricular wall  thickness. Left ventricular systolic function is moderately reduced. The EF  is 30-35%. There is mild to moderate global hypokinesis of the left  ventricle. There is moderate to severe inferior wall hypokinesis. The right ventricle is moderate to severely dilated. The right ventricular  systolic function is mildly reduced. The interatrial septum is intact with no evidence for an atrial septal  defect. The left atrium is severely dilated. Right atrial size is normal. There is a ruptured chordae with flail of the A3 segment of the anterior  mitral leaflet. No significant mitral valve stenosis. There is severe mitral  regurgitation. Flow reversal noted in pulmonary veins consistent with  significant mitral regurgitation. The mitral regurgitant jet is posteriorly  directed, which is consistent with anterior leaflet pathology. The tricuspid valve is normal in structure and function. There is trace  tricuspid regurgitation. The aortic valve is trileaflet. The aortic valve opens well. There is a small  irregularity on the left coronary cusp adjacent to the right coronary cusp.  Cannot exclude aortic valvular vegetation. No hemodynamically significant  valvular aortic stenosis. Trace to mild AI with central jet. The pulmonic valve is normal in structure and function. > The aortic root is normal size. No obvious dissection could be visualized.  There is no pericardial effusion. There is no pleural effusion.  Post-Intervention Summary The EF remains 30-35%. There is moderate to severe inferior wall hypokinesis. The right ventricular systolic function is normal. The interatrial septum is intact  with no evidence for an atrial septal defect. There is no mitral regurgitation noted. An annuloplasty ring is noted in the  mitral position. There is trace tricuspid regurgitation. Trace to mild AI. The pulmonic valve is not well visualized. No obvious dissection could be visualized. There is no pericardial effusion.  There is no pleural effusion. MMode/2D Measurements & Calculations EDV(MOD-sp4): 72.3 ml ESV(MOD-sp4): 117.0 ml EDV(MOD-sp2): 257.0 ml ESV(MOD-sp2): 150.0 ml SV(MOD-sp4): -44.7 ml Pre-Intervention Doppler Measurements and Calculations MV max PG: 11.1 mmHg MV mean PG: 4.8 mmHg Ao V2 max: 87.0 cm/sec Ao max PG: 3.0 mmHg Ao V2 mean: 51.9 cm/sec Ao mean PG: 1.4 mmHg Ao V2 VTI: 13.4 cm   Overall TEE Interpretation Summary Pre-intervention exam: The LV is severey dilated with mild to moderate global  hypokinesis. The EF is 30-35%. There is moderate to severe hypokinesis of the  inferior wall. The RV systolic function is mildly reduced. There is a  ruptured chordae and flail of the A3 segment of the anterior mitral leaflet.  The posterior leaflet appears normal. Severe MR with posteriorly directed jet  and systolic flow reversal in the pulmonary veins. There is a small echodense  irregularity on the left coronary cusp of the aortic valve adjacent to the  right coronary cusp. Vegetation cannot be ruled out. There is trace to mild  AI with a central jet. There is trace TR. There is no obvious aortic  dissection. There is no pericardial effusion. There is no pleural effusion. Post-intervention exam: The LV EF remains unchanged from the pre-intervention  exam. The RV systolic function has improved and is now normal. There is an  annuloplasty ring in the mitral position. There is no MR. Otherwise the exam  is unchanged from the pre-intervention exam. At the conclusion of the  procedure the probe was removed. There were no apparant complications related  to the TEE  examination.     Jun 18 2018 echo SUMMARY There is normal left ventricular wall thickness. The left ventricle is mildly dilated.  Left ventricular systolic function is mildly reduced. LV ejection fraction = 40-45%. Abnormal (paradoxical) septal motion consistent with post-operative status. There is hypokinesis of the basal to mid inferior wall. The right ventricle is normal in size and function. The left atrial size is  normal. An annuloplasty ring is noted in the mitral position. The mean gradient across the mitral valve is 7.6 mmHg. There is no mitral regurgitation noted. The aortic sinus is normal in size. IVC size was normal. There is no pericardial effusion. Compared to the last surface echo dated 06/06/18, the LV systolic function is  slightly improved and the patient is s/p mitral valve repair.   Jul 28 2018 TTE SUMMARY The left ventricle is mildly dilated.  Left ventricular systolic function is moderately reduced. LV ejection fraction = 35-40%. The right ventricle is normal size. The right ventricular systolic function is mildly reduced. The left atrium is mildly dilated. There is mild aortic regurgitation. Status post mitral valve repair with annuloplasty ring There is mild to moderate mitral regurgitation. The mean gradient across the mitral valve is 9.8 mmHg. The heart rate for the mean mitral valve gradient is 92 BPM. There is mild tricuspid regurgitation. There was insufficient TR detected to calculate RV systolic pressure. Estimated right atrial pressure is 10 mmHg.Marland Kitchen There is no pericardial effusion.     Jul 28 2018 TEE - SUMMARY s/p Mitral valve repair with resection of ruptured anterior papillary muscle,  reconstruction of papillary chord with two Neo-chords and placement of  Simplici-T Annuloplasty ring. Noted a mobile echodensity on the posterior leaflet of the mitral valve,  which may represents surgical suture. Differential include torn mitral valve   chordae or vegetation. Compared to the post-op TEE, MR appears worse.  Clinical correlation advised. There is mild aortic regurgitation. A mobile echodensity noted on the left coronary cusp of the aortic valve  which was seen in the previous TEEs. Noted mild eccentric AI.     Jan 2020 CT scan 1. Soft tissue density in the anterior mediastinum adjacent to the median sternotomy which may simply be postoperative, but cannot exclude cellulitis. No drainable fluid collection. 2. Trace hydropneumopericardium as well as small locules of air in the anterior mediastinum inferiorly, most likely postoperative in nature. 3. Significant improved aeration when compared to prior study with residual atelectasis/scarring in the right lower lobe. There is residual airspace consolidation left lower lobe consistent with resultant discoid atelectasis or pneumonia. 4. . Filling defect in the right jugular vein extending into the right brachiocephalic vein as well as a more broad-based filling defect in the left brachiocephalic vein, consistent with thrombus. 5. Interval sternotomy and mitral valve replacement with expected postoperative changes.     06/07/18 surgery note  PREOPERATIVE DIAGNOSIS: ICD-10 I34.0 Mitral valve insufficiency  POSTOPERATIVE DIAGNOSIS: same  QUALITY MEASURES: 4047F Documentation of order for prophylactic antibiotics to be given within one hour (if fluoroquinolone or vancomycin, two hours) prior to surgical incision (or start of procedure when no incision is required) 4041F Documentation of order for cefazolin or cefuroxime for antimicrobial prophylaxis  HEMODYNAMICS AND CATH: Ejection fraction: 35%  VALVE DATA: Mitral Valve: Severe Insufficiency  PRIORITY: Emergent  INCIDENCE: First cardiovascular surgery   PROCEDURE: CPT Codes: 33295 * Mitral valve repair with resection of ruptured anterior papillary muscle, reconstruction of papillary chord with two Neo-chords and  placement of Simplici-T Annuloplasty ring via superior septal approach Model: 670 Serial: [id number]  CARDIOPULMONARY BYPASS TIME: 131 min  AORTIC CROSS CLAMP TIME: 92 min  DESCRIPTION OF OPERATION After induction of anesthesia, the patient was prepped and draped. Before the incision was made, time-out was observed by all members of the surgical and anesthesia teams to identify the correct patient and procedure. The heart was exposed via a full conventional  sternotomy. The anatomy of the heart and great vessels was normal. On palpation the aorta was normal.   Intraoperative transesophageal echocardiography showed the left ventricular function to be hypokinetic with an ejection fraction of 35%. Exam of the cardiac valves showed severe mitral regurgitation with anterior A2/A3 leaflet prolapse.  After heparinization, cardiopulmonary bypass was instituted. Arterial perfusion was via an arterial cannula placed in the aorta. For assisted venous return, drainage was from two cannuli placed through the right atrium into the superior and inferior vena cavi. Caval tapes were placed. The patient was cooled to a body core temperature of 36 degrees centigrade. The left ventricle was vented through the both aorta and right superior pulmonary vein.  The ascending aorta was cross-clamped and the heart was arrested with cold DelNido cardioplegia given antegrade. Once dose was adequate for the aforementioned cross clamp time. Topical cooling was used.  The mitral valve was exposed via a superior septal approach incision. The left atrium was carefully examined. There was no clot present. Retraction sutures were placed and the valve was well visualized. The valve was bicuspid and was diseased from an apparent degenerative etiology. The calcification of the leaflets was none and no calcification of the annulus. The valve was inspected and the defect found to be ruptured anterior papillary muscle causing the A2/A3 prolapse.  The anterior leaflet was thickened. There were no vegetations or stigmata of endocarditis. The valve was amenable to repair.  The ruptured papillary muscle was resected. The chord was then reconstructed utilizing two 5-0 Neo-chords. The valve was statically tested and was found to be competent without leak.  Thirteen sutures of 2-0 dacron were placed in simple mattress fashion in the annulus from trigone to trigone posteriorly. A Medtronic Simplici T band was chosen and the sutures passed through the band. The band was seated and the sutures secured via CorKnots. The valve was again statically tested and was found to have a small leak at the medial commissure. A commisuroplasty was performed utilizing 5-0 was performed. The valve then appeared to be hydrostatic.   The left ventricular vent was placed across the mitral valve. The left atrial and interatrial septal portions of the incisions were closed with running 4-0 Prolene. An aortic vent was placed to low suction and the cross clamp released. The right atrial portion of the incision was closed with a 4-0 Prolene suture. Caval tapes were released.  The patient was rewarmed. Cardioversion occurred spontaneously. The underlying rhythm was normal sinus. Atrial and ventricular pacing was used. The patient was weaned and separated from cardiopulmonary bypass. After discontinuation of cardiopulmonary bypass the heart adequately supported the circulation. Inotropes were used upon leaving the operating room. The left ventricular function was moderatly hypokinetic. The right ventricular function was mildly hypokinetic. Post procedure intraoperative transesophageal echocardiogram demonstrated an ejection fraction of 35% and there was well-seated annuloplasty ring with no mitral regurgitation.  The bypass lines were removed and the Heparin reversed with Protamine. The pericardium was drained with one 24 Fr. Blake drain. The pleural space did not require a chest  tube. The sternum was reapproximated with #7 and double stranded stainless steel wires. The linea alba was closed with #2 absorbable sutures. The pectoralis fascia was closed with #0 absorbable sutures. Subcutaneous tissues were closed with 2-0 absorbable suture. The skin was closed with 3-0 absorbable subcuticular suture. Sterile dressings were placed over the wounds.  There were no complications and sponge, instrument, and needle counts were reported as correct. The patient was transported  to the Intensive Care Unit in stable condition.      05/2020 echo IMPRESSIONS     1. Left ventricular ejection fraction, by estimation, is 40 to 45%. The  left ventricle has mildly decreased function.   2. Right ventricular systolic function moderately to severely reduced.  The right ventricular size is moderately enlarged.   3. Left atrial size was severely dilated. No left atrial/left atrial  appendage thrombus was detected. The LAA emptying velocity was 60 cm/s.   4. Right atrial size was severely dilated.   5. Difficult visualization of mitral valve and regurgitant jet. Findings  consistent with prior repair. Thickened leaflets with restricted motion.  The MR jet was not amenable to PISA. Moderate mean gradient across the  valve of 6 mmHg. In images #[id number]  there appears to be regurgitation jet through the valve but also lateral  to the anular ring. The cumulative regurgitation appears to be severe. .  The mitral valve has been repaired/replaced. Severe mitral valve  regurgitation. Moderate mitral stenosis.   6. The tricuspid valve is abnormal. Tricuspid valve regurgitation is  moderate to severe.   7. The aortic valve is tricuspid. Aortic valve regurgitation is not  visualized. No aortic stenosis is present.   8. Technically difficult study, standard views were off axis particularly  of the mitral valve.      08/2020 RHC/LHC Assessment:   1. Normal coronary arteries 2. NICM EF 30-35% 3.  Severe MR with prominent v-waves in PCWP tracing 4. Mild pulmonary venous HTN with normal CO    Assessment and Plan  1. Mitral regurgitation - complex history as described above  - severe MR with biventricular failure, being considered for repeat MV surgery by Dr Cornelius Moras - has completed dental evals and extractions, has been evaluated by CHF clinic. Overdue for f/u with Dr Cornelius Moras, we will look to arrange.        2. Chronic systolic HF - some recent orthopnea, SOB/DOE - difficult taking lasix bid due to work schedule, increase AM dose to 100mg  daily. Check BMET/mg in 2 weeks, start aldcatone 12.5mg  daily.    3. Aflutter - recurrence last month with DCCV in ER, since that time no isues - continue current meds including amio, anticoagulation.       F/u 2 months. Look to get him back in with Dr Cornelius Moras.       [name], M.D

## 2021-05-09 ENCOUNTER — Other Ambulatory Visit: Payer: Self-pay

## 2021-05-09 ENCOUNTER — Emergency Department (HOSPITAL_COMMUNITY)
Admission: EM | Admit: 2021-05-09 | Discharge: 2021-05-09 | Disposition: A | Discharge status: Home / Self Care | Payer: Self-pay | Attending: Emergency Medicine | Admitting: Emergency Medicine

## 2021-05-09 ENCOUNTER — Emergency Department (HOSPITAL_COMMUNITY): Payer: Self-pay

## 2021-05-09 DIAGNOSIS — I5022 Chronic systolic (congestive) heart failure: Secondary | ICD-10-CM | POA: Insufficient documentation

## 2021-05-09 DIAGNOSIS — I509 Heart failure, unspecified: Secondary | ICD-10-CM

## 2021-05-09 DIAGNOSIS — F17210 Nicotine dependence, cigarettes, uncomplicated: Secondary | ICD-10-CM | POA: Insufficient documentation

## 2021-05-09 DIAGNOSIS — Z7901 Long term (current) use of anticoagulants: Secondary | ICD-10-CM | POA: Insufficient documentation

## 2021-05-09 DIAGNOSIS — R011 Cardiac murmur, unspecified: Secondary | ICD-10-CM | POA: Insufficient documentation

## 2021-05-09 LAB — CBC
HCT: 38.9 % — ABNORMAL LOW (ref 39.0–52.0)
Hemoglobin: 12.8 g/dL — ABNORMAL LOW (ref 13.0–17.0)
MCH: 29.4 pg (ref 26.0–34.0)
MCHC: 32.9 g/dL (ref 30.0–36.0)
MCV: 89.4 fL (ref 80.0–100.0)
Platelets: 307 10*3/uL (ref 150–400)
RBC: 4.35 MIL/uL (ref 4.22–5.81)
RDW: 15.8 % — ABNORMAL HIGH (ref 11.5–15.5)
WBC: 9.7 10*3/uL (ref 4.0–10.5)
nRBC: 0 % (ref 0.0–0.2)

## 2021-05-09 LAB — BRAIN NATRIURETIC PEPTIDE: B Natriuretic Peptide: 1301 pg/mL — ABNORMAL HIGH (ref 0.0–100.0)

## 2021-05-09 LAB — BASIC METABOLIC PANEL
Anion gap: 5 (ref 5–15)
BUN: 18 mg/dL (ref 6–20)
CO2: 26 mmol/L (ref 22–32)
Calcium: 8.5 mg/dL — ABNORMAL LOW (ref 8.9–10.3)
Chloride: 105 mmol/L (ref 98–111)
Creatinine, Ser: 1.06 mg/dL (ref 0.61–1.24)
GFR, Estimated: >60 mL/min (ref >60)
Glucose, Bld: 141 mg/dL — ABNORMAL HIGH (ref 70–99)
Potassium: 3.6 mmol/L (ref 3.5–5.1)
Sodium: 136 mmol/L (ref 135–145)

## 2021-05-09 LAB — MAGNESIUM: Magnesium: 1.5 mg/dL — ABNORMAL LOW (ref 1.7–2.4)

## 2021-05-09 MED ORDER — POTASSIUM CHLORIDE CRYS ER 20 MEQ PO TBCR
20.0000 meq | EXTENDED_RELEASE_TABLET | Freq: Once | ORAL | Status: AC
  Administered 2021-05-09: 20 meq via ORAL
  Filled 2021-05-09: qty 1

## 2021-05-09 MED ORDER — FUROSEMIDE 10 MG/ML IJ SOLN
INTRAMUSCULAR | Status: AC
  Filled 2021-05-09: qty 10

## 2021-05-09 MED ORDER — FUROSEMIDE 10 MG/ML IJ SOLN
100.0000 mg | Freq: Once | INTRAVENOUS | Status: AC
  Administered 2021-05-09: 100 mg via INTRAVENOUS
  Filled 2021-05-09: qty 10

## 2021-05-09 MED ORDER — MAGNESIUM SULFATE 2 GM/50ML IV SOLN
2.0000 g | Freq: Once | INTRAVENOUS | Status: AC
  Administered 2021-05-09: 2 g via INTRAVENOUS
  Filled 2021-05-09: qty 50

## 2021-05-09 MED ORDER — FUROSEMIDE 40 MG PO TABS: 40.0000 mg | ORAL_TABLET | Freq: Every day | ORAL | 0 refills | Status: DC

## 2021-05-09 NOTE — ED Notes (Signed)
Pt ambulated around nurses station O2 94-99% HR.108-110 Pt states he felt fine while walking

## 2021-05-09 NOTE — ED Notes (Signed)
Called [facility] for lasix

## 2021-05-09 NOTE — ED Provider Notes (Signed)
[facility] EMERGENCY DEPARTMENT Provider Note   CSN: [id number] Arrival date & time: 05/09/21  1859     History Chief Complaint  Patient presents with   Shortness of Breath    Donald [name] is a 35 y.o. male.  He has a history of congestive heart failure DVT paroxysmal a flutter mitral regurgitation status postrepair.  He is complaining of 2 days of increased shortness of breath dyspnea on exertion and edema in his legs.  He said he has been taking his diuretics and all of his other medications regularly.  He does not feel like he is urinating as much as usual.  Has a nonproductive cough.  No fevers or chills no chest pain no abdominal pain.  The history is provided by the patient.  Shortness of Breath Severity:  Moderate Onset quality:  Gradual Duration:  2 days Timing:  Constant Progression:  Worsening Chronicity:  Recurrent Relieved by:  Nothing Worsened by:  Activity Ineffective treatments:  Diuretics Associated symptoms: cough   Associated symptoms: no abdominal pain, no chest pain, no fever, no headaches, no hemoptysis, no neck pain, no rash, no sore throat and no vomiting   Risk factors: hx of PE/DVT       Past Medical History:  Diagnosis Date   Anemia    Autoimmune disorder (HCC)    pyoderma gangrenosum   CHF (congestive heart failure) (HCC)    Chronic systolic heart failure (HCC)    a. EF 35-40% by echo in 07/2018 b. EF at 45% by repeat echo in 04/2020   DVT (deep venous thrombosis) (HCC)    h/o   Dysrhythmia    Mitral regurgitation    a. 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 b. 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HCC)    arthritis   Tricuspid regurgitation     Patient Active Problem List   Diagnosis Date Noted    Caries    Chronic periodontitis    Retained dental root    Tricuspid regurgitation    Mitral regurgitation    Chronic systolic heart failure (HCC)    Autoimmune disorder (HCC)    Seronegative spondylitis (HCC)     Past Surgical History:  Procedure Laterality Date   CARDIAC CATHETERIZATION     HERNIA REPAIR Right 2018   MITRAL VALVE REPAIR  06/07/2018   [facility] - Dr Meda Klinefelter   MULTIPLE EXTRACTIONS WITH ALVEOLOPLASTY N/A 11/08/2020   Procedure: EXTRACTION OF TEETH NUMBER ONE, FIFTHTEEN, SIXTEEN, SEVENTEEN, EIGHTTEEN AND THRTY WITH ALVEOLOPLASTY OF LOWER LEFT QUADRANT.;  Surgeon: Sharman Cheek, DMD;  Location: [facility] OR;  Service: Dentistry;  Laterality: N/A;   RIGHT/LEFT HEART CATH AND CORONARY ANGIOGRAPHY N/A 09/11/2020   Procedure: RIGHT/LEFT HEART CATH AND CORONARY ANGIOGRAPHY;  Surgeon: Dolores Patty, MD;  Location: [facility] INVASIVE CV LAB;  Service: Cardiovascular;  Laterality: N/A;   TEE WITHOUT CARDIOVERSION N/A 05/23/2020   Procedure: TRANSESOPHAGEAL ECHOCARDIOGRAM (TEE) WITH PROPOFOL;  Surgeon: [name], MD;  Location: [facility] ENDO SUITE;  Service: Endoscopy;  Laterality: N/A;       Family History  Problem Relation Age of Onset   Multiple sclerosis Mother    Psoriasis Mother    Depression Father    Diabetes Father    Diabetes  Paternal Grandmother     Social History   Tobacco Use   Smoking status: Some Days    Types: Cigarettes   Smokeless tobacco: Never  Vaping Use   Vaping Use: Never used  Substance Use Topics   Alcohol use: No   Drug use: No    Home Medications Prior to Admission medications   Medication Sig Start Date End Date Taking? Authorizing Provider  amiodarone (PACERONE) 200 MG tablet Take 200 mg by mouth daily.    [provider]  apixaban (ELIQUIS) 5 MG TABS tablet Take 1 tablet (5 mg total) by mouth 2 (two) times daily. 03/18/21   Arnoldo Lenis, MD  ferrous sulfate 325 (65 FE) MG tablet Take 1 tablet (325 mg  total) by mouth daily. Patient taking differently: Take 325 mg by mouth in the morning. 12/23/18   Arnoldo Lenis, MD  furosemide (LASIX) 20 MG tablet Take 1 tablet (20 mg total) by mouth daily. With 80 mg tablet 03/18/21   Arnoldo Lenis, MD  furosemide (LASIX) 80 MG tablet Take 1 tablet (80 mg total) by mouth daily. With 20 mg tablet 03/18/21   Arnoldo Lenis, MD  metoprolol succinate (TOPROL-XL) 50 MG 24 hr tablet Take 50 mg by mouth daily. Take with or immediately following a meal.    [provider]  potassium chloride SA (KLOR-CON) 20 MEQ tablet Take 20 mEq by mouth daily.    [provider]  sacubitril-valsartan (ENTRESTO) 24-26 MG Take 1 tablet by mouth 2 (two) times daily. 08/27/20   Bensimhon, Shaune Pascal, MD  spironolactone (ALDACTONE) 25 MG tablet Take 0.5 tablets (12.5 mg total) by mouth daily. 03/18/21   Arnoldo Lenis, MD    Allergies    Patient has no known allergies.  Review of Systems   Review of Systems  Constitutional:  Negative for fever.  HENT:  Negative for sore throat.   Eyes:  Negative for visual disturbance.  Respiratory:  Positive for cough and shortness of breath. Negative for hemoptysis.   Cardiovascular:  Positive for leg swelling. Negative for chest pain.  Gastrointestinal:  Negative for abdominal pain and vomiting.  Genitourinary:  Negative for dysuria.  Musculoskeletal:  Negative for neck pain.  Skin:  Negative for rash.  Neurological:  Negative for headaches.   Physical Exam Updated Vital Signs BP (!) 144/99 (BP Location: Right Arm)   Pulse 79   Temp 98.2 F (36.8 C) (Oral)   Resp 20   Ht 5\' 9"  (1.753 m)   Wt 103.9 kg   SpO2 98%   BMI 33.83 kg/m   Physical Exam Vitals and nursing note reviewed.  Constitutional:      General: He is not in acute distress.    Appearance: He is well-developed.  HENT:     Head: Normocephalic and atraumatic.  Eyes:     Conjunctiva/sclera: Conjunctivae normal.  Cardiovascular:      Rate and Rhythm: Normal rate and regular rhythm.     Heart sounds: Murmur heard.  Pulmonary:     Effort: Pulmonary effort is normal. No respiratory distress.     Breath sounds: Normal breath sounds.  Abdominal:     Palpations: Abdomen is soft.     Tenderness: There is no abdominal tenderness.  Musculoskeletal:        General: No swelling. Normal range of motion.     Cervical back: Neck supple.     Right lower leg: No tenderness. Edema present.  Left lower leg: No tenderness. Edema present.  Skin:    General: Skin is warm and dry.     Capillary Refill: Capillary refill takes less than 2 seconds.  Neurological:     General: No focal deficit present.     Mental Status: He is alert.  Psychiatric:        Mood and Affect: Mood normal.    ED Results / Procedures / Treatments   Labs (all labs ordered are listed, but only abnormal results are displayed) Labs Reviewed  BASIC METABOLIC PANEL - Abnormal; Notable for the following components:      Result Value   Glucose, Bld 141 (*)    Calcium 8.5 (*)    All other components within normal limits  MAGNESIUM - Abnormal; Notable for the following components:   Magnesium 1.5 (*)    All other components within normal limits  BRAIN NATRIURETIC PEPTIDE - Abnormal; Notable for the following components:   B Natriuretic Peptide 1,301.0 (*)    All other components within normal limits  CBC - Abnormal; Notable for the following components:   Hemoglobin 12.8 (*)    HCT 38.9 (*)    RDW 15.8 (*)    All other components within normal limits    EKG EKG Interpretation  Date/Time:  Thursday May 09 2021 19:37:11 EST Ventricular Rate:  78 PR Interval:  146 QRS Duration: 111 QT Interval:  422 QTC Calculation: 481 R Axis:   81 Text Interpretation: Sinus rhythm Multiform ventricular premature complexes Inferior infarct, old Abnormal lateral Q waves increased pvcs from prior 9/22 Confirmed by Aletta Edouard [phone]) on 05/09/2021 7:43:01  PM  Radiology DG Chest Portable 1 View  Result Date: 05/09/2021 CLINICAL DATA:  Cough. Bilateral foot swelling. Shortness of breath for 2 days. EXAM: PORTABLE CHEST 1 VIEW COMPARISON:  April 30, 2021 FINDINGS: Cardiomegaly. Intact sternotomy wires. No pneumothorax. No nodules or masses. No focal infiltrates or overt edema. IMPRESSION: No active disease. Electronically Signed   By: Dorise Bullion III M.D.   On: 05/09/2021 19:47    Procedures Procedures   Medications Ordered in ED Medications  furosemide (LASIX) 100 mg in dextrose 5 % 50 mL IVPB (0 mg Intravenous Stopped 05/09/21 2126)  potassium chloride SA (KLOR-CON) CR tablet 20 mEq (20 mEq Oral Given 05/09/21 2016)  magnesium sulfate IVPB 2 g 50 mL (0 g Intravenous Stopped 05/09/21 2259)    ED Course  I have reviewed the triage vital signs and the nursing notes.  Pertinent labs & imaging results that were available during my care of the patient were reviewed by me and considered in my medical decision making (see chart for details).  Clinical Course as of 05/10/21 1045  Thu May 09, 2021  1950 Chest x-ray interpreted by me as cardiomegaly likely some congestive heart failure.  Awaiting radiology reading [MB]  2255 He says he feels better and has diuresed almost 2 L.  He is not feeling shortness of breath and is satting 100% on room air.  BNP is elevated but chest x-ray did not show any significant fluid overload.  He is comfortable plan for increasing his diuretic and following up with his cardiologist.  Return instructions discussed [MB]    Clinical Course User Index [MB] Hayden Rasmussen, MD   MDM Rules/Calculators/A&P                          This patient complains of dyspnea on exertion and  peripheral edema; this involves an extensive number of treatment Options and is a complaint that carries with it a high risk of complications and Morbidity. The differential includes CHF, pneumonia, COVID, flu, AKI, anemia,  arrhythmia  I ordered, reviewed and interpreted labs, which included CBC with normal white count, hemoglobin slightly down from priors, chemistries with mildly elevated glucose normal renal function, BNP elevated at 1300 although has been higher in past, magnesium low at 1.5 I ordered medication IV Lasix, IV magnesium, oral potassium I ordered imaging studies which included chest x-ray and I independently    visualized and interpreted imaging which showed cardiomegaly no acute findings Previous records obtained and reviewed in epic including prior cardiology notes  After the interventions stated above, I reevaluated the patient and found patient to have significant diuresis and symptomatic improvement.  Reviewed results with him and he feels he is able to manage his symptoms at home.  Recommended close follow-up with primary care doctor and cardiology.  We will increase his Lasix for short course.  Return instructions discussed   Final Clinical Impression(s) / ED Diagnoses Final diagnoses:  Acute on chronic congestive heart failure, unspecified heart failure type (HCC)  Hypomagnesemia    Rx / DC Orders ED Discharge Orders          Ordered    furosemide (LASIX) 40 MG tablet  Daily        05/09/21 2305             Terrilee Files, MD 05/10/21 1048

## 2021-05-09 NOTE — ED Triage Notes (Signed)
Pt states cough, bilateral feet swelling and SOB x2 days.

## 2021-05-09 NOTE — Discharge Instructions (Signed)
You were seen in the emergency department for worsening shortness of breath and swelling in your legs.  Your labs show your heart failure was worsening.  You received some intravenous Lasix with good urine output.  Your symptoms improved.  Please call Dr. Verna Czech office tomorrow for close follow-up.  We are increasing your Lasix by 40 mg.  If your shortness of breath worsens please return to the emergency department.

## 2021-05-23 ENCOUNTER — Telehealth: Payer: Self-pay | Admitting: Cardiology

## 2021-05-23 ENCOUNTER — Emergency Department (HOSPITAL_COMMUNITY)
Admission: EM | Admit: 2021-05-23 | Discharge: 2021-05-23 | Disposition: A | Discharge status: Home / Self Care | Payer: Medicaid Other | Attending: Emergency Medicine | Admitting: Emergency Medicine

## 2021-05-23 ENCOUNTER — Other Ambulatory Visit: Payer: Self-pay

## 2021-05-23 ENCOUNTER — Emergency Department (HOSPITAL_COMMUNITY): Payer: Medicaid Other

## 2021-05-23 DIAGNOSIS — Z79899 Other long term (current) drug therapy: Secondary | ICD-10-CM | POA: Insufficient documentation

## 2021-05-23 DIAGNOSIS — R0602 Shortness of breath: Secondary | ICD-10-CM | POA: Insufficient documentation

## 2021-05-23 DIAGNOSIS — I5022 Chronic systolic (congestive) heart failure: Secondary | ICD-10-CM | POA: Insufficient documentation

## 2021-05-23 DIAGNOSIS — F17210 Nicotine dependence, cigarettes, uncomplicated: Secondary | ICD-10-CM | POA: Insufficient documentation

## 2021-05-23 DIAGNOSIS — Z7901 Long term (current) use of anticoagulants: Secondary | ICD-10-CM | POA: Insufficient documentation

## 2021-05-23 DIAGNOSIS — I480 Paroxysmal atrial fibrillation: Secondary | ICD-10-CM | POA: Insufficient documentation

## 2021-05-23 LAB — COMPREHENSIVE METABOLIC PANEL
ALT: 47 U/L — ABNORMAL HIGH (ref 0–44)
AST: 86 U/L — ABNORMAL HIGH (ref 15–41)
Albumin: 3.3 g/dL — ABNORMAL LOW (ref 3.5–5.0)
Alkaline Phosphatase: 88 U/L (ref 38–126)
Anion gap: 8 (ref 5–15)
BUN: 13 mg/dL (ref 6–20)
CO2: 28 mmol/L (ref 22–32)
Calcium: 8.7 mg/dL — ABNORMAL LOW (ref 8.9–10.3)
Chloride: 101 mmol/L (ref 98–111)
Creatinine, Ser: 0.97 mg/dL (ref 0.61–1.24)
GFR, Estimated: >60 mL/min (ref >60)
Glucose, Bld: 102 mg/dL — ABNORMAL HIGH (ref 70–99)
Potassium: 3.4 mmol/L — ABNORMAL LOW (ref 3.5–5.1)
Sodium: 137 mmol/L (ref 135–145)
Total Bilirubin: 1.4 mg/dL — ABNORMAL HIGH (ref 0.3–1.2)
Total Protein: 6.8 g/dL (ref 6.5–8.1)

## 2021-05-23 LAB — CBC WITH DIFFERENTIAL/PLATELET
Abs Immature Granulocytes: 0.05 10*3/uL (ref 0.00–0.07)
Basophils Absolute: 0.1 10*3/uL (ref 0.0–0.1)
Basophils Relative: 1 %
Eosinophils Absolute: 0.1 10*3/uL (ref 0.0–0.5)
Eosinophils Relative: 1 %
HCT: 44.2 % (ref 39.0–52.0)
Hemoglobin: 13.8 g/dL (ref 13.0–17.0)
Immature Granulocytes: 1 %
Lymphocytes Relative: 31 %
Lymphs Abs: 3.1 10*3/uL (ref 0.7–4.0)
MCH: 28.5 pg (ref 26.0–34.0)
MCHC: 31.2 g/dL (ref 30.0–36.0)
MCV: 91.1 fL (ref 80.0–100.0)
Monocytes Absolute: 0.5 10*3/uL (ref 0.1–1.0)
Monocytes Relative: 5 %
Neutro Abs: 6.3 10*3/uL (ref 1.7–7.7)
Neutrophils Relative %: 61 %
Platelets: 201 10*3/uL (ref 150–400)
RBC: 4.85 MIL/uL (ref 4.22–5.81)
RDW: 16.3 % — ABNORMAL HIGH (ref 11.5–15.5)
WBC: 10 10*3/uL (ref 4.0–10.5)
nRBC: 0 % (ref 0.0–0.2)

## 2021-05-23 LAB — BRAIN NATRIURETIC PEPTIDE: B Natriuretic Peptide: 766 pg/mL — ABNORMAL HIGH (ref 0.0–100.0)

## 2021-05-23 LAB — TROPONIN I (HIGH SENSITIVITY)
Troponin I (High Sensitivity): 66 ng/L — ABNORMAL HIGH (ref <18)
Troponin I (High Sensitivity): 64 ng/L — ABNORMAL HIGH (ref <18)

## 2021-05-23 MED ORDER — FUROSEMIDE 10 MG/ML IJ SOLN
60.0000 mg | Freq: Once | INTRAMUSCULAR | Status: AC
  Administered 2021-05-23: 60 mg via INTRAVENOUS
  Filled 2021-05-23: qty 6

## 2021-05-23 NOTE — ED Notes (Signed)
Pt ambulated in hall while wearing pulse ox on room air. Remained 99% or greater for the duration of this activity, states that he still feels shob though some improvement from presentation. Pulse 108bpm throughout activity.

## 2021-05-23 NOTE — ED Triage Notes (Signed)
Short of breath for the past 2 days 

## 2021-05-23 NOTE — Telephone Encounter (Signed)
Pt c/o of Chest Pain: STAT if CP now or developed within 24 hours  1. Are you having CP right now? ACTIVE  CHEST PRESSURE SINCE THIS MORNING  2. Are you experiencing any other symptoms (ex. SOB, nausea, vomiting, sweating)? SOB  3. How long have you been experiencing CP? SINCE THIS MORNING  4. Is your CP continuous or coming and going? CONTINUOUS  5. Have you taken Nitroglycerin? NO, PT WAS NEVER PRESCRIBED NITRO ?

## 2021-05-23 NOTE — ED Provider Notes (Signed)
Emergency Medicine Provider Triage Evaluation Note  Donald [name] , a 35 y.o. male  was evaluated in triage.  Pt complains of chest pain.  Pt reports he is followed by cardiology.  Pt reports he has had a valve replacement   Review of Systems  Positive: Congestion and chest pain Negative: No fever  Physical Exam  BP (!) 131/95   Pulse 94   Temp 97.9 F (36.6 C) (Oral)   Resp 20   SpO2 100%  Gen:   Awake, no distress  Resp:   Normal effort  MSK:   Moves extremities without difficulty  Other:    Medical Decision Making  Medically screening exam initiated at 5:21 PM.  Appropriate orders placed.  Donald [name] was informed that the remainder of the evaluation will be completed by another provider, this initial triage assessment does not replace that evaluation, and the importance of remaining in the ED until their evaluation is complete.     Elson Areas, PA-C 05/23/21 1723    Rozelle Logan, DO 05/23/21 2346

## 2021-05-23 NOTE — Discharge Instructions (Signed)
You have been seen and discharged from the emergency department.  Your cardiac work-up was baseline for you.  You were given additional IV diuresis here in the department.  Cardiology would like to see you at your scheduled appointment as an outpatient.  Follow-up with your primary provider for reevaluation and further care. Take home medications as prescribed. If you have any worsening symptoms or further concerns for your health please return to an emergency department for further evaluation.

## 2021-05-23 NOTE — Telephone Encounter (Signed)
Reports chest tightness rated 7/10 when inhaling. Reports he woke up this way today. Reports SOB. Denies chest pain, dizziness or swelling. Advised to go to the ED for an evaluation. Verbalized understanding of plan.

## 2021-05-23 NOTE — ED Provider Notes (Signed)
[facility] EMERGENCY DEPARTMENT Provider Note   CSN: [id number] Arrival date & time: 05/23/21  1610     History Chief Complaint  Patient presents with   Shortness of Breath    Donald [name] is a 35 y.o. male.  HPI     Past Medical History:  Diagnosis Date   Anemia    Autoimmune disorder ([place])    pyoderma gangrenosum   CHF (congestive heart failure) ([place])    Chronic systolic heart failure (HCC)    a. EF 35-40% by echo in 07/2018 b. EF at 45% by repeat echo in 04/2020   DVT (deep venous thrombosis) (HCC)    h/o   Dysrhythmia    Mitral regurgitation    a. 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 b. 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HCC)    arthritis   Tricuspid regurgitation     Patient Active Problem List   Diagnosis Date Noted   Caries    Chronic periodontitis    Retained dental root    Tricuspid regurgitation    Mitral regurgitation    Chronic systolic heart failure ([place])    Autoimmune disorder ([place])    Seronegative spondylitis ([place])     Past Surgical History:  Procedure Laterality Date   CARDIAC CATHETERIZATION     HERNIA REPAIR Right 2018   MITRAL VALVE REPAIR  06/07/2018   [place] - Dr Virgina Organ   MULTIPLE EXTRACTIONS WITH ALVEOLOPLASTY N/A 11/08/2020   Procedure: EXTRACTION OF TEETH NUMBER ONE, FIFTHTEEN, SIXTEEN, SEVENTEEN, EIGHTTEEN AND THRTY WITH ALVEOLOPLASTY OF LOWER LEFT QUADRANT.;  Surgeon: Charlaine Dalton, DMD;  Location: [place];  Service: Dentistry;  Laterality: N/A;   RIGHT/LEFT HEART CATH AND CORONARY ANGIOGRAPHY N/A 09/11/2020   Procedure: RIGHT/LEFT HEART CATH AND CORONARY ANGIOGRAPHY;  Surgeon: Jolaine Artist, MD;  Location: [place] CV LAB;  Service: Cardiovascular;   Laterality: N/A;   TEE WITHOUT CARDIOVERSION N/A 05/23/2020   Procedure: TRANSESOPHAGEAL ECHOCARDIOGRAM (TEE) WITH PROPOFOL;  Surgeon: Arnoldo Lenis, MD;  Location: [facility] ENDO SUITE;  Service: Endoscopy;  Laterality: N/A;       Family History  Problem Relation Age of Onset   Multiple sclerosis Mother    Psoriasis Mother    Depression Father    Diabetes Father    Diabetes Paternal Grandmother     Social History   Tobacco Use   Smoking status: Some Days    Types: Cigarettes   Smokeless tobacco: Never  Vaping Use   Vaping Use: Never used  Substance Use Topics   Alcohol use: No   Drug use: No    Home Medications Prior to Admission medications   Medication Sig Start Date End Date Taking? Authorizing Provider  amiodarone (PACERONE) 200 MG tablet Take 200 mg by mouth daily.    [provider]  apixaban (ELIQUIS) 5 MG TABS tablet Take 1 tablet (5 mg total) by mouth 2 (two) times daily. 03/18/21   Arnoldo Lenis, MD  ferrous sulfate 325 (65 FE) MG tablet Take 1 tablet (325 mg total) by mouth daily. Patient taking differently: Take 325 mg by mouth in the morning. 12/23/18   Arnoldo Lenis, MD  furosemide (LASIX) 20 MG tablet Take 1 tablet (20 mg total) by  mouth daily. With 80 mg tablet 03/18/21   Arnoldo Lenis, MD  furosemide (LASIX) 80 MG tablet Take 1 tablet (80 mg total) by mouth daily. With 20 mg tablet 03/18/21   Arnoldo Lenis, MD  metoprolol succinate (TOPROL-XL) 50 MG 24 hr tablet Take 50 mg by mouth daily. Take with or immediately following a meal.    [provider]  potassium chloride SA (KLOR-CON) 20 MEQ tablet Take 20 mEq by mouth daily.    [provider]  sacubitril-valsartan (ENTRESTO) 24-26 MG Take 1 tablet by mouth 2 (two) times daily. 08/27/20   Bensimhon, Shaune Pascal, MD  spironolactone (ALDACTONE) 25 MG tablet Take 0.5 tablets (12.5 mg total) by mouth daily. 03/18/21   Arnoldo Lenis, MD    Allergies    Patient has no  known allergies.  Review of Systems   Review of Systems  Constitutional:  Positive for fatigue. Negative for chills and fever.  HENT:  Negative for congestion.   Eyes:  Negative for visual disturbance.  Respiratory:  Positive for shortness of breath.   Cardiovascular:  Positive for leg swelling. Negative for chest pain.  Gastrointestinal:  Negative for abdominal pain, diarrhea and vomiting.  Genitourinary:  Negative for dysuria.  Skin:  Negative for rash.  Neurological:  Negative for headaches.   Physical Exam Updated Vital Signs BP (!) 105/93   Pulse 95   Temp 97.9 F (36.6 C) (Oral)   Resp (!) 30   SpO2 100%   Physical Exam Vitals and nursing note reviewed.  Constitutional:      General: He is not in acute distress.    Appearance: Normal appearance. He is not diaphoretic.  HENT:     Head: Normocephalic.     Mouth/Throat:     Mouth: Mucous membranes are moist.  Cardiovascular:     Rate and Rhythm: Normal rate.  Pulmonary:     Effort: Pulmonary effort is normal. No tachypnea, accessory muscle usage or respiratory distress.     Breath sounds: Examination of the right-lower field reveals rales. Examination of the left-lower field reveals rales. Rales present.  Abdominal:     Palpations: Abdomen is soft.     Tenderness: There is no abdominal tenderness.  Musculoskeletal:     Right lower leg: No edema.     Left lower leg: No edema.  Skin:    General: Skin is warm.  Neurological:     Mental Status: He is alert and oriented to person, place, and time. Mental status is at baseline.  Psychiatric:        Mood and Affect: Mood normal.    ED Results / Procedures / Treatments   Labs (all labs ordered are listed, but only abnormal results are displayed) Labs Reviewed  CBC WITH DIFFERENTIAL/PLATELET - Abnormal; Notable for the following components:      Result Value   RDW 16.3 (*)    All other components within normal limits  COMPREHENSIVE METABOLIC PANEL - Abnormal;  Notable for the following components:   Potassium 3.4 (*)    Glucose, Bld 102 (*)    Calcium 8.7 (*)    Albumin 3.3 (*)    AST 86 (*)    ALT 47 (*)    Total Bilirubin 1.4 (*)    All other components within normal limits  BRAIN NATRIURETIC PEPTIDE - Abnormal; Notable for the following components:   B Natriuretic Peptide 766.0 (*)    All other components within normal limits  TROPONIN I (  HIGH SENSITIVITY) - Abnormal; Notable for the following components:   Troponin I (High Sensitivity) 66 (*)    All other components within normal limits  TROPONIN I (HIGH SENSITIVITY)    EKG None  Radiology DG Chest 2 View  Result Date: 05/23/2021 CLINICAL DATA:  Short of breath EXAM: CHEST - 2 VIEW COMPARISON:  05/09/2021 FINDINGS: Stable cardiomegaly. No pleural effusion or pneumothorax. Persistent pulmonary vascular congestion. No acute osseous abnormality. IMPRESSION: Stable cardiomegaly and persistent pulmonary vascular congestion. Electronically Signed   By: Guadlupe Spanish M.D.   On: 05/23/2021 16:58    Procedures Procedures   Medications Ordered in ED Medications  furosemide (LASIX) injection 60 mg (has no administration in time range)    ED Course  I have reviewed the triage vital signs and the nursing notes.  Pertinent labs & imaging results that were available during my care of the patient were reviewed by me and considered in my medical decision making (see chart for details).    MDM Rules/Calculators/A&P                           35 year old male presents emergency department with worsening baseline shortness of breath.  History of mitral valve regurgitation with repair, CHF.  Follows with Dr. Wyline Mood for cardiology.  On large dose of Lasix daily that he states has been compliant with.  No acute changes in his diet, no reported fever or recent illness.  Endorses mild swelling of his lower legs and orthopnea.  Blood work is reassuring.  BNP and tropes are baseline.  Chest x-ray  shows stable cardiomegaly and persistent pulmonary edema.  Plan for IV diuresis and reevaluation.  Low suspicion for PE, patient is anticoagulated on Eliquis.  Spoke with on-call physician for Dr. Wyline Mood.  Plan for diuresis and if patient is stable from a respiratory standpoint refer outpatient.  He has a follow-up appointment in 7 days in their office.  No findings of decompensation or need for emergent admission.  Patient at this time appears safe and stable for discharge and will be treated as an outpatient.  Discharge plan and strict return to ED precautions discussed, patient verbalizes understanding and agreement.  Final Clinical Impression(s) / ED Diagnoses Final diagnoses:  None    Rx / DC Orders ED Discharge Orders     None        Rozelle Logan, DO 05/23/21 2018

## 2021-05-24 NOTE — Telephone Encounter (Signed)
Looks like he was evaluated in the ER and was able to be discharged   Dominga Ferry MD

## 2021-05-29 ENCOUNTER — Inpatient Hospital Stay (HOSPITAL_COMMUNITY)
Admission: EM | Admit: 2021-05-29 | Discharge: 2021-06-04 | DRG: 291 | Disposition: A | Discharge status: Other Acute Inpatient Hospital | Payer: Medicaid Other | Attending: Internal Medicine | Admitting: Internal Medicine

## 2021-05-29 ENCOUNTER — Ambulatory Visit: Payer: Self-pay | Admitting: Cardiology

## 2021-05-29 ENCOUNTER — Emergency Department (HOSPITAL_COMMUNITY): Payer: Medicaid Other

## 2021-05-29 ENCOUNTER — Telehealth: Payer: Self-pay | Admitting: Cardiology

## 2021-05-29 ENCOUNTER — Other Ambulatory Visit: Payer: Self-pay

## 2021-05-29 DIAGNOSIS — R570 Cardiogenic shock: Secondary | ICD-10-CM | POA: Diagnosis present

## 2021-05-29 DIAGNOSIS — E46 Unspecified protein-calorie malnutrition: Secondary | ICD-10-CM | POA: Diagnosis present

## 2021-05-29 DIAGNOSIS — R778 Other specified abnormalities of plasma proteins: Secondary | ICD-10-CM

## 2021-05-29 DIAGNOSIS — I472 Ventricular tachycardia, unspecified: Secondary | ICD-10-CM | POA: Diagnosis present

## 2021-05-29 DIAGNOSIS — E8809 Other disorders of plasma-protein metabolism, not elsewhere classified: Secondary | ICD-10-CM | POA: Diagnosis present

## 2021-05-29 DIAGNOSIS — E871 Hypo-osmolality and hyponatremia: Secondary | ICD-10-CM | POA: Diagnosis present

## 2021-05-29 DIAGNOSIS — Z6832 Body mass index (BMI) 32.0-32.9, adult: Secondary | ICD-10-CM

## 2021-05-29 DIAGNOSIS — L88 Pyoderma gangrenosum: Secondary | ICD-10-CM | POA: Diagnosis present

## 2021-05-29 DIAGNOSIS — E669 Obesity, unspecified: Secondary | ICD-10-CM | POA: Diagnosis present

## 2021-05-29 DIAGNOSIS — R7989 Other specified abnormal findings of blood chemistry: Secondary | ICD-10-CM

## 2021-05-29 DIAGNOSIS — Z79899 Other long term (current) drug therapy: Secondary | ICD-10-CM

## 2021-05-29 DIAGNOSIS — Z7901 Long term (current) use of anticoagulants: Secondary | ICD-10-CM

## 2021-05-29 DIAGNOSIS — I5043 Acute on chronic combined systolic (congestive) and diastolic (congestive) heart failure: Secondary | ICD-10-CM | POA: Diagnosis present

## 2021-05-29 DIAGNOSIS — R739 Hyperglycemia, unspecified: Secondary | ICD-10-CM | POA: Diagnosis present

## 2021-05-29 DIAGNOSIS — I5082 Biventricular heart failure: Secondary | ICD-10-CM | POA: Diagnosis present

## 2021-05-29 DIAGNOSIS — I252 Old myocardial infarction: Secondary | ICD-10-CM

## 2021-05-29 DIAGNOSIS — I480 Paroxysmal atrial fibrillation: Secondary | ICD-10-CM | POA: Diagnosis present

## 2021-05-29 DIAGNOSIS — F17210 Nicotine dependence, cigarettes, uncomplicated: Secondary | ICD-10-CM | POA: Diagnosis present

## 2021-05-29 DIAGNOSIS — J9601 Acute respiratory failure with hypoxia: Secondary | ICD-10-CM | POA: Diagnosis present

## 2021-05-29 DIAGNOSIS — I509 Heart failure, unspecified: Secondary | ICD-10-CM

## 2021-05-29 DIAGNOSIS — I081 Rheumatic disorders of both mitral and tricuspid valves: Secondary | ICD-10-CM | POA: Diagnosis present

## 2021-05-29 DIAGNOSIS — I4892 Unspecified atrial flutter: Secondary | ICD-10-CM | POA: Diagnosis present

## 2021-05-29 DIAGNOSIS — E876 Hypokalemia: Secondary | ICD-10-CM | POA: Diagnosis present

## 2021-05-29 DIAGNOSIS — I272 Pulmonary hypertension, unspecified: Secondary | ICD-10-CM | POA: Diagnosis present

## 2021-05-29 DIAGNOSIS — R7401 Elevation of levels of liver transaminase levels: Secondary | ICD-10-CM

## 2021-05-29 DIAGNOSIS — Z86718 Personal history of other venous thrombosis and embolism: Secondary | ICD-10-CM

## 2021-05-29 DIAGNOSIS — R9431 Abnormal electrocardiogram [ECG] [EKG]: Secondary | ICD-10-CM

## 2021-05-29 DIAGNOSIS — I5021 Acute systolic (congestive) heart failure: Secondary | ICD-10-CM | POA: Diagnosis present

## 2021-05-29 DIAGNOSIS — I5023 Acute on chronic systolic (congestive) heart failure: Principal | ICD-10-CM | POA: Diagnosis present

## 2021-05-29 DIAGNOSIS — I428 Other cardiomyopathies: Secondary | ICD-10-CM | POA: Diagnosis present

## 2021-05-29 DIAGNOSIS — N179 Acute kidney failure, unspecified: Secondary | ICD-10-CM | POA: Diagnosis present

## 2021-05-29 DIAGNOSIS — Z20822 Contact with and (suspected) exposure to covid-19: Secondary | ICD-10-CM | POA: Diagnosis present

## 2021-05-29 LAB — CBC WITH DIFFERENTIAL/PLATELET
Abs Immature Granulocytes: 0.04 10*3/uL (ref 0.00–0.07)
Basophils Absolute: 0 10*3/uL (ref 0.0–0.1)
Basophils Relative: 0 %
Eosinophils Absolute: 0.1 10*3/uL (ref 0.0–0.5)
Eosinophils Relative: 1 %
HCT: 43.5 % (ref 39.0–52.0)
Hemoglobin: 14.4 g/dL (ref 13.0–17.0)
Immature Granulocytes: 0 %
Lymphocytes Relative: 29 %
Lymphs Abs: 2.9 10*3/uL (ref 0.7–4.0)
MCH: 29.4 pg (ref 26.0–34.0)
MCHC: 33.1 g/dL (ref 30.0–36.0)
MCV: 88.8 fL (ref 80.0–100.0)
Monocytes Absolute: 0.5 10*3/uL (ref 0.1–1.0)
Monocytes Relative: 5 %
Neutro Abs: 6.5 10*3/uL (ref 1.7–7.7)
Neutrophils Relative %: 65 %
Platelets: 241 10*3/uL (ref 150–400)
RBC: 4.9 MIL/uL (ref 4.22–5.81)
RDW: 15.5 % (ref 11.5–15.5)
WBC: 10.1 10*3/uL (ref 4.0–10.5)
nRBC: 0 % (ref 0.0–0.2)

## 2021-05-29 LAB — COMPREHENSIVE METABOLIC PANEL
ALT: 30 U/L (ref 0–44)
AST: 56 U/L — ABNORMAL HIGH (ref 15–41)
Albumin: 3.4 g/dL — ABNORMAL LOW (ref 3.5–5.0)
Alkaline Phosphatase: 104 U/L (ref 38–126)
Anion gap: 14 (ref 5–15)
BUN: 26 mg/dL — ABNORMAL HIGH (ref 6–20)
CO2: 22 mmol/L (ref 22–32)
Calcium: 8.9 mg/dL (ref 8.9–10.3)
Chloride: 98 mmol/L (ref 98–111)
Creatinine, Ser: 1.71 mg/dL — ABNORMAL HIGH (ref 0.61–1.24)
GFR, Estimated: 54 mL/min — ABNORMAL LOW (ref >60)
Glucose, Bld: 158 mg/dL — ABNORMAL HIGH (ref 70–99)
Potassium: 3.2 mmol/L — ABNORMAL LOW (ref 3.5–5.1)
Sodium: 134 mmol/L — ABNORMAL LOW (ref 135–145)
Total Bilirubin: 1.4 mg/dL — ABNORMAL HIGH (ref 0.3–1.2)
Total Protein: 8 g/dL (ref 6.5–8.1)

## 2021-05-29 LAB — BRAIN NATRIURETIC PEPTIDE: B Natriuretic Peptide: 2863 pg/mL — ABNORMAL HIGH (ref 0.0–100.0)

## 2021-05-29 LAB — TROPONIN I (HIGH SENSITIVITY): Troponin I (High Sensitivity): 138 ng/L (ref <18)

## 2021-05-29 NOTE — Telephone Encounter (Signed)
Pt missed apt with Dr. Wyline Mood this morning, he was late. Came into office stating that he is having some shortness of breath and is obtaining more fluid - was seen at [facility] ED 2x this month for this reason.   I r/s pt to see Dr. Anne Fu tomorrow in [place]  Please give pt a call @ [phone]

## 2021-05-29 NOTE — Progress Notes (Incomplete)
Clinical Summary Donald [name] is a 35 y.o.maleseen today for follow up of the following medical problems.    1. Mitral regurgitation - admit 05/2018 with acute respiratory failure. From notes had cardaic arrest with induction for intubation.  - TTE done showed severe MR - TEE showed severe prolapse and possible flail A3 scallop with severe MR.    - 06/07/2018 went to OR for MV repair with resection of ruptured anterior papillary muscle, reconstruction of papillary chord with two Neo-chords and placement of Simplici-T Annuloplasty ring via superior septal approach - Intraop/Postop TEE reports no MR initially after surgery     07/2018 TEE during repeat admission mobile echodensity posterior leaflet, possible surgical suture, vs torn chordae vs vegetation. The MR is not reported regardign severity, ERO 0.93 would suggest severe, unclear  - CT surgery at that time recommended continued medical therapy, reconsider repeat intervention at ouptatient followup - thought perhaps an autoimmune disease may have causes some myxomatous degeneration. Undergoing rheum evaluation     - 04/2020 echo LVEF 50%, severe BAE, MV/AV VTI is 3 suggesting possible severe MR.    05/2020 TEE: Severe MR, LVEF 40-45%, mod to severe RV dysfunction - referred to Dr Roxy Manns, considering MV replacement     -he has had dentral evaluation and extration and has been evaluated by CHF clinic - overdue for follow up with CT surgery             2. Chronic systolic HF - readmitted [id number] with volume overload - multiple TTE's and TEEs as reported below. Last LVEF 35-40% by TTE in 07/2018, LVIDd 5.6 and LVIDs 4.3, mild LAE.   - discharged on Lasix 40mg  daily, lisinopril 5mg , TOprol 25mg , ASA 81, atorva 10,      - recent issues with fluid overload over the last few months. Partly due to some missed lasix doses. Had an ER visit with pulm edema, did not require admission 04/2020 echo LVEF 50%, severe BAE, MV/AV VTI is 3  suggesting possible severe MR.      ER visit 04/2020 with volume overload, managed in ER with diuresis.  - admit 05/18/20 with volume overload, diuresed and dischaged. ProBNP was 7000. K 3.3 Cr 1. CXR with pulm edema - presented with abdominal distension, SOB.Had been compliant lasix 40mg  bid.      08/2020 LHC/RHC Normal coronaries, LVEF 30-35%. CI 2.48 Mean PA 32 PCWP17 LVEDP 8 - followed in CHF clinic. Recently changed to entresto     - some recent SOB, orthopnea, LE edema - taking lasix 80mg  once daily. Difficult to take bid dosing due to his job     3. 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    4. 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 Atrial flutter - several episodes in setting of severe LAE       - seen in ER 02/25/2021 with aflutter with RVR - had DCCV - no recent palpitations.            SH: mother is Donald [name], also a patient of mine Has a sone Donald [name] 1.33 yo.  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 EF 35-40% by echo in 07/2018 b. EF at 45% by repeat echo in 04/2020   DVT (deep venous thrombosis) (HCC)    h/o   Dysrhythmia    Mitral regurgitation    a. 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 b. 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   Mitral stenosis    Myocardial infarction (HCC)    Paroxysmal atrial flutter (HCC)    Pyoderma gangrenosa    Seronegative  spondylitis (HCC)    arthritis   Tricuspid regurgitation      No Known Allergies   Current Outpatient Medications  Medication Sig Dispense Refill   amiodarone (PACERONE) 200 MG tablet Take 200 mg by mouth daily. (Patient not taking: Reported on 05/23/2021)     apixaban (ELIQUIS) 5 MG TABS tablet Take 1 tablet (5 mg total) by mouth 2 (two) times daily. 60 tablet 6   ferrous sulfate 325 (65 FE) MG tablet Take 1 tablet (325 mg total) by mouth daily. (Patient taking differently: Take 325 mg by mouth in the morning.) 90 tablet 1   furosemide (LASIX) 20 MG tablet Take 1 tablet (20 mg total) by mouth daily. With 80 mg tablet 90 tablet 1   furosemide (LASIX) 40 MG tablet Take 1 tablet by mouth daily.     furosemide (LASIX) 80 MG tablet Take 1 tablet (80 mg total) by mouth daily. With 20 mg tablet 90 tablet 1   metoprolol succinate (TOPROL-XL) 50 MG 24 hr tablet Take 50 mg by mouth daily. Take with or immediately following a meal.     potassium chloride SA (KLOR-CON M) 20 MEQ tablet Take 20 mEq by mouth daily.     sacubitril-valsartan (ENTRESTO) 24-26 MG Take 1 tablet by mouth 2 (two) times daily. 60 tablet 3   spironolactone (ALDACTONE) 25 MG tablet Take 0.5 tablets (12.5 mg total) by mouth daily. 45 tablet 1   No current facility-administered medications for this visit.     Past Surgical History:  Procedure Laterality Date   CARDIAC CATHETERIZATION     HERNIA REPAIR Right 2018   MITRAL VALVE REPAIR  06/07/2018   [facility] - Dr Virgina Organ   MULTIPLE EXTRACTIONS WITH ALVEOLOPLASTY N/A 11/08/2020   Procedure: EXTRACTION OF TEETH NUMBER ONE, FIFTHTEEN, SIXTEEN, SEVENTEEN, EIGHTTEEN AND THRTY WITH ALVEOLOPLASTY OF LOWER LEFT QUADRANT.;  Surgeon: Charlaine Dalton, DMD;  Location: [place];  Service: Dentistry;  Laterality: N/A;   RIGHT/LEFT HEART CATH AND CORONARY ANGIOGRAPHY N/A 09/11/2020   Procedure: RIGHT/LEFT HEART CATH AND CORONARY ANGIOGRAPHY;  Surgeon: Jolaine Artist, MD;   Location: [place] [facility];  Service: Cardiovascular;  Laterality: N/A;   TEE WITHOUT CARDIOVERSION N/A 05/23/2020   Procedure: TRANSESOPHAGEAL ECHOCARDIOGRAM (TEE) WITH PROPOFOL;  Surgeon: Arnoldo Lenis, MD;  Location[facility];  Service: Endoscopy;  Laterality: N/A;     No Known Allergies    Family History  Problem Relation Age of Onset   Multiple sclerosis Mother    Psoriasis Mother    Depression Father    Diabetes Father    Diabetes Paternal Grandmother      Social History Mr. Roye reports that he has been smoking cigarettes. He  has never used smokeless tobacco. Mr. Bramson reports no history of alcohol use.   Review of Systems CONSTITUTIONAL: No weight loss, fever, chills, weakness or fatigue.  HEENT: Eyes: No visual loss, blurred vision, double vision or yellow sclerae.No hearing loss, sneezing, congestion, runny nose or sore throat.  SKIN: No rash or itching.  CARDIOVASCULAR:  RESPIRATORY: No shortness of breath, cough or sputum.  GASTROINTESTINAL: No anorexia, nausea, vomiting or diarrhea. No abdominal pain or blood.  GENITOURINARY: No burning on urination, no polyuria NEUROLOGICAL: No headache, dizziness, syncope, paralysis, ataxia, numbness or tingling in the extremities. No change in bowel or bladder control.  MUSCULOSKELETAL: No muscle, back pain, joint pain or stiffness.  LYMPHATICS: No enlarged nodes. No history of splenectomy.  PSYCHIATRIC: No history of depression or anxiety.  ENDOCRINOLOGIC: No reports of sweating, cold or heat intolerance. No polyuria or polydipsia.  Marland Kitchen   Physical Examination There were no vitals filed for this visit. There were no vitals filed for this visit.  Gen: resting comfortably, no acute distress HEENT: no scleral icterus, pupils equal round and reactive, no palptable cervical adenopathy,  CV Resp: Clear to auscultation bilaterally GI: abdomen is soft, non-tender, non-distended, normal bowel sounds, no  hepatosplenomegaly MSK: extremities are warm, no edema.  Skin: warm, no rash Neuro:  no focal deficits Psych: appropriate affect   Diagnostic Studies  06/06/18 Echo [facility] SUMMARY Mild left ventricular hypertrophy The left ventricle is mildly dilated. The apex is contracting normally. The rest of LV wall is hypokinetic. LV ejection fraction = 35-40%.  Left ventricular systolic function is moderately reduced. The right ventricle is normal in size and function. There is severe mitral regurgitation. There is mild mitral valve thickening. Suspect anterior mitral valve prolapse. Recommend TEE for further evaluation. Normal IVC size with decreased respiratory collapse. There is no pericardial effusion. There is no comparison study available.     06/06/2018 TEE [facility] SUMMARY There is severe prolapse (possible flail) of A3 scallop resulting in severe,  posteriorly directed regurgitation with a 0.5 cm gap in closure. Pulmonary  vein flow shows systolic reversal. Papillary muscles appear intact. No  vegetations seen. The left ventricular size is normal.  Left ventricular systolic function is mildly reduced. The right ventricle is normal in size and function. Diffuse thickening of the aortic valve with preserved cusp opening. There is  an extremely small ( <2 mm) echodensity on the ventricular side of the aortic  valve--no significant aortic regurgitation; recommend clinical correlation. There is mild tricuspid regurgitation. No overt vegetations identified. 3D Echocardiography was performed and reviewed for assessment of cardiac  structure and function using a workstation. Findings were integrated into the  echo report.   06/07/18 Intraop TEE Pre-Intervention Summary An Omniplane TEE probe was inserted orally. A full TEE exam was performed  including 2D, M-Mode, Color and Doppler. The TEE probe was placed  atraumatically on the first attempt. The left ventricle is severely dilated.  There is normal left ventricular wall  thickness. Left ventricular systolic function is moderately reduced. The EF  is 30-35%. There is mild to moderate global hypokinesis of the left  ventricle. There is moderate to severe inferior wall hypokinesis. The right ventricle is moderate to severely dilated. The right ventricular  systolic function is mildly reduced. The interatrial septum is intact with no evidence for an atrial septal  defect. The left atrium is severely dilated. Right atrial size is normal. There is a ruptured chordae with flail of the A3 segment of the anterior  mitral leaflet. No significant mitral valve stenosis. There is severe mitral  regurgitation. Flow reversal noted in pulmonary veins consistent with  significant mitral regurgitation. The mitral regurgitant jet is posteriorly  directed, which is consistent with anterior leaflet pathology. The tricuspid valve is normal in structure and function. There is trace  tricuspid regurgitation. The aortic valve is trileaflet. The aortic valve opens well. There is a small  irregularity on the left coronary cusp adjacent to the right coronary cusp.  Cannot exclude aortic valvular vegetation. No hemodynamically significant  valvular aortic stenosis. Trace to mild AI with central jet. The pulmonic valve is normal in structure and function. > The aortic root is normal size. No obvious dissection could be visualized.  There is no pericardial effusion. There is no pleural effusion.  Post-Intervention Summary The EF remains 30-35%. There is moderate to severe inferior wall hypokinesis. The right ventricular systolic function is normal. The interatrial septum is intact with no evidence for an atrial septal defect. There is no mitral regurgitation noted. An annuloplasty ring is noted in the  mitral position. There is trace tricuspid regurgitation. Trace to mild AI. The pulmonic valve is not well visualized. No obvious dissection  could be visualized. There is no pericardial effusion.  There is no pleural effusion. MMode/2D Measurements & Calculations EDV(MOD-sp4): 72.3 ml ESV(MOD-sp4): 117.0 ml EDV(MOD-sp2): 257.0 ml ESV(MOD-sp2): 150.0 ml SV(MOD-sp4): -44.7 ml Pre-Intervention Doppler Measurements and Calculations MV max PG: 11.1 mmHg MV mean PG: 4.8 mmHg Ao V2 max: 87.0 cm/sec Ao max PG: 3.0 mmHg Ao V2 mean: 51.9 cm/sec Ao mean PG: 1.4 mmHg Ao V2 VTI: 13.4 cm   Overall TEE Interpretation Summary Pre-intervention exam: The LV is severey dilated with mild to moderate global  hypokinesis. The EF is 30-35%. There is moderate to severe hypokinesis of the  inferior wall. The RV systolic function is mildly reduced. There is a  ruptured chordae and flail of the A3 segment of the anterior mitral leaflet.  The posterior leaflet appears normal. Severe MR with posteriorly directed jet  and systolic flow reversal in the pulmonary veins. There is a small echodense  irregularity on the left coronary cusp of the aortic valve adjacent to the  right coronary cusp. Vegetation cannot be ruled out. There is trace to mild  AI with a central jet. There is trace TR. There is no obvious aortic  dissection. There is no pericardial effusion. There is no pleural effusion. Post-intervention exam: The LV EF remains unchanged from the pre-intervention  exam. The RV systolic function has improved and is now normal. There is an  annuloplasty ring in the mitral position. There is no MR. Otherwise the exam  is unchanged from the pre-intervention exam. At the conclusion of the  procedure the probe was removed. There were no apparant complications related  to the TEE examination.     Jun 18 2018 echo SUMMARY There is normal left ventricular wall thickness. The left ventricle is mildly dilated.  Left ventricular systolic function is mildly reduced. LV ejection fraction = 40-45%. Abnormal (paradoxical) septal motion consistent with  post-operative status. There is hypokinesis of the basal to mid inferior wall. The right ventricle is normal in size and function. The left atrial size is normal. An annuloplasty ring is noted in the mitral position. The mean gradient across the mitral valve is 7.6 mmHg. There is no mitral regurgitation noted. The aortic sinus is normal in size. IVC size was normal. There is no pericardial effusion. Compared to the  last surface echo dated 06/06/18, the LV systolic function is  slightly improved and the patient is s/p mitral valve repair.   Jul 28 2018 TTE SUMMARY The left ventricle is mildly dilated.  Left ventricular systolic function is moderately reduced. LV ejection fraction = 35-40%. The right ventricle is normal size. The right ventricular systolic function is mildly reduced. The left atrium is mildly dilated. There is mild aortic regurgitation. Status post mitral valve repair with annuloplasty ring There is mild to moderate mitral regurgitation. The mean gradient across the mitral valve is 9.8 mmHg. The heart rate for the mean mitral valve gradient is 92 BPM. There is mild tricuspid regurgitation. There was insufficient TR detected to calculate RV systolic pressure. Estimated right atrial pressure is 10 mmHg.Marland Kitchen There is no pericardial effusion.     Jul 28 2018 TEE - SUMMARY s/p Mitral valve repair with resection of ruptured anterior papillary muscle,  reconstruction of papillary chord with two Neo-chords and placement of  Simplici-T Annuloplasty ring. Noted a mobile echodensity on the posterior leaflet of the mitral valve,  which may represents surgical suture. Differential include torn mitral valve  chordae or vegetation. Compared to the post-op TEE, MR appears worse.  Clinical correlation advised. There is mild aortic regurgitation. A mobile echodensity noted on the left coronary cusp of the aortic valve  which was seen in the previous TEEs. Noted mild eccentric  AI.     Jan 2020 CT scan 1. Soft tissue density in the anterior mediastinum adjacent to the median sternotomy which may simply be postoperative, but cannot exclude cellulitis. No drainable fluid collection. 2. Trace hydropneumopericardium as well as small locules of air in the anterior mediastinum inferiorly, most likely postoperative in nature. 3. Significant improved aeration when compared to prior study with residual atelectasis/scarring in the right lower lobe. There is residual airspace consolidation left lower lobe consistent with resultant discoid atelectasis or pneumonia. 4. . Filling defect in the right jugular vein extending into the right brachiocephalic vein as well as a more broad-based filling defect in the left brachiocephalic vein, consistent with thrombus. 5. Interval sternotomy and mitral valve replacement with expected postoperative changes.     06/07/18 surgery note  PREOPERATIVE DIAGNOSIS: ICD-10 I34.0 Mitral valve insufficiency  POSTOPERATIVE DIAGNOSIS: same  QUALITY MEASURES: 4047F Documentation of order for prophylactic antibiotics to be given within one hour (if fluoroquinolone or vancomycin, two hours) prior to surgical incision (or start of procedure when no incision is required) 4041F Documentation of order for cefazolin or cefuroxime for antimicrobial prophylaxis  HEMODYNAMICS AND CATH: Ejection fraction: 35%  VALVE DATA: Mitral Valve: Severe Insufficiency  PRIORITY: Emergent  INCIDENCE: First cardiovascular surgery   PROCEDURE: CPT Codes: 56256 * Mitral valve repair with resection of ruptured anterior papillary muscle, reconstruction of papillary chord with two Neo-chords and placement of Simplici-T Annuloplasty ring via superior septal approach Model: 670 Serial: [id number]  CARDIOPULMONARY BYPASS TIME: 131 min  AORTIC CROSS CLAMP TIME: 92 min  DESCRIPTION OF OPERATION After induction of anesthesia, the patient was prepped and draped. Before the  incision was made, time-out was observed by all members of the surgical and anesthesia teams to identify the correct patient and procedure. The heart was exposed via a full conventional sternotomy. The anatomy of the heart and great vessels was normal. On palpation the aorta was normal.   Intraoperative transesophageal echocardiography showed the left ventricular function to be hypokinetic with an ejection fraction of 35%. Exam of the cardiac valves showed severe mitral regurgitation  with anterior A2/A3 leaflet prolapse.  After heparinization, cardiopulmonary bypass was instituted. Arterial perfusion was via an arterial cannula placed in the aorta. For assisted venous return, drainage was from two cannuli placed through the right atrium into the superior and inferior vena cavi. Caval tapes were placed. The patient was cooled to a body core temperature of 36 degrees centigrade. The left ventricle was vented through the both aorta and right superior pulmonary vein.  The ascending aorta was cross-clamped and the heart was arrested with cold DelNido cardioplegia given antegrade. Once dose was adequate for the aforementioned cross clamp time. Topical cooling was used.  The mitral valve was exposed via a superior septal approach incision. The left atrium was carefully examined. There was no clot present. Retraction sutures were placed and the valve was well visualized. The valve was bicuspid and was diseased from an apparent degenerative etiology. The calcification of the leaflets was none and no calcification of the annulus. The valve was inspected and the defect found to be ruptured anterior papillary muscle causing the A2/A3 prolapse. The anterior leaflet was thickened. There were no vegetations or stigmata of endocarditis. The valve was amenable to repair.  The ruptured papillary muscle was resected. The chord was then reconstructed utilizing two 5-0 Neo-chords. The valve was statically tested and was found  to be competent without leak.  Thirteen sutures of 2-0 dacron were placed in simple mattress fashion in the annulus from trigone to trigone posteriorly. A Medtronic Simplici T band was chosen and the sutures passed through the band. The band was seated and the sutures secured via CorKnots. The valve was again statically tested and was found to have a small leak at the medial commissure. A commisuroplasty was performed utilizing 5-0 was performed. The valve then appeared to be hydrostatic.   The left ventricular vent was placed across the mitral valve. The left atrial and interatrial septal portions of the incisions were closed with running 4-0 Prolene. An aortic vent was placed to low suction and the cross clamp released. The right atrial portion of the incision was closed with a 4-0 Prolene suture. Caval tapes were released.  The patient was rewarmed. Cardioversion occurred spontaneously. The underlying rhythm was normal sinus. Atrial and ventricular pacing was used. The patient was weaned and separated from cardiopulmonary bypass. After discontinuation of cardiopulmonary bypass the heart adequately supported the circulation. Inotropes were used upon leaving the operating room. The left ventricular function was moderatly hypokinetic. The right ventricular function was mildly hypokinetic. Post procedure intraoperative transesophageal echocardiogram demonstrated an ejection fraction of 35% and there was well-seated annuloplasty ring with no mitral regurgitation.  The bypass lines were removed and the Heparin reversed with Protamine. The pericardium was drained with one 24 Fr. Blake drain. The pleural space did not require a chest tube. The sternum was reapproximated with #7 and double stranded stainless steel wires. The linea alba was closed with #2 absorbable sutures. The pectoralis fascia was closed with #0 absorbable sutures. Subcutaneous tissues were closed with 2-0 absorbable suture. The skin was closed  with 3-0 absorbable subcuticular suture. Sterile dressings were placed over the wounds.  There were no complications and sponge, instrument, and needle counts were reported as correct. The patient was transported to the Intensive Care Unit in stable condition.      05/2020 echo IMPRESSIONS     1. Left ventricular ejection fraction, by estimation, is 40 to 45%. The  left ventricle has mildly decreased function.   2. Right ventricular systolic  function moderately to severely reduced.  The right ventricular size is moderately enlarged.   3. Left atrial size was severely dilated. No left atrial/left atrial  appendage thrombus was detected. The LAA emptying velocity was 60 cm/s.   4. Right atrial size was severely dilated.   5. Difficult visualization of mitral valve and regurgitant jet. Findings  consistent with prior repair. Thickened leaflets with restricted motion.  The MR jet was not amenable to PISA. Moderate mean gradient across the  valve of 6 mmHg. In images #[id number]  there appears to be regurgitation jet through the valve but also lateral  to the anular ring. The cumulative regurgitation appears to be severe. .  The mitral valve has been repaired/replaced. Severe mitral valve  regurgitation. Moderate mitral stenosis.   6. The tricuspid valve is abnormal. Tricuspid valve regurgitation is  moderate to severe.   7. The aortic valve is tricuspid. Aortic valve regurgitation is not  visualized. No aortic stenosis is present.   8. Technically difficult study, standard views were off axis particularly  of the mitral valve.      08/2020 RHC/LHC Assessment:   1. Normal coronary arteries 2. NICM EF 30-35% 3. Severe MR with prominent v-waves in PCWP tracing 4. Mild pulmonary venous HTN with normal CO   Assessment and Plan   1. Mitral regurgitation - complex history as described above  - severe MR with biventricular failure, being considered for repeat MV surgery by Dr Cornelius Moras - has  completed dental evals and extractions, has been evaluated by CHF clinic. Overdue for f/u with Dr Cornelius Moras, we will look to arrange.        2. Chronic systolic HF - some recent orthopnea, SOB/DOE - difficult taking lasix bid due to work schedule, increase AM dose to 100mg  daily. Check BMET/mg in 2 weeks, start aldcatone 12.5mg  daily.    3. Aflutter - recurrence last month with DCCV in ER, since that time no isues - continue current meds including amio, anticoagulation.        , M.D., F.A.C.C.

## 2021-05-29 NOTE — ED Notes (Signed)
Date and time results received: 05/29/21 2308  Test: Troponin Critical Value: 138  Name of Provider Notified: Dr. Rubin Payor  Orders Received? Or Actions Taken?: Acknowledged

## 2021-05-29 NOTE — ED Provider Notes (Signed)
[facility] EMERGENCY DEPARTMENT Provider Note   CSN: [id number] Arrival date & time: 05/29/21  2148     History Chief Complaint  Patient presents with   Shortness of Breath    Donald [name] is a 35 y.o. male.  The history is provided by the patient.  Shortness of Breath Associated symptoms: cough   Associated symptoms: no abdominal pain and no rash   Patient presents with shortness of breath.  Had been seen about a week ago for the same.  History of CHF.  Has been diuresed in the ER with plans of following up with Dr. Harl Bowie tomorrow.  Has been worse for around a day but states he was good for couple days and then began to worsen.  States he feels as if he is having more swelling.  States his weight is going up.  Some chest tightness.  Occasional cough without real sputum production.  No fevers.  States he has a bad mitral valve that needs surgery again.    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 EF 35-40% by echo in 07/2018 b. EF at 45% by repeat echo in 04/2020   DVT (deep venous thrombosis) (H[facility])    h/o   Dysrhythmia    Mitral regurgitation    a. 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 b. 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[place])    Seronegative spondylitis ([place])     Past Surgical History:  Procedure Laterality  Date   CARDIAC CATHETERIZATION     HERNIA REPAIR Right 2018   MITRAL VALVE REPAIR  06/07/2018   [place] - Dr Virgina Organ   MULTIPLE EXTRACTIONS WITH ALVEOLOPLASTY N/A 11/08/2020   Procedure: EXTRACTION OF TEETH NUMBER ONE, FIFTHTEEN, SIXTEEN, SEVENTEEN, EIGHTTEEN AND THRTY WITH ALVEOLOPLASTY OF LOWER LEFT QUADRANT.;  Surgeon: Charlaine Dalton, DMD;  Location: [place];  Service: Dentistry;  Laterality: N/A;   RIGHT/LEFT HEART CATH AND CORONARY ANGIOGRAPHY N/A 09/11/2020   Procedure: RIGHT/LEFT HEART CATH AND CORONARY ANGIOGRAPHY;  Surgeon: Jolaine Artist, MD;  Location: [place] [facility];  Service: Cardiovascular;  Laterality: N/A;   TEE WITHOUT CARDIOVERSION N/A 05/23/2020   Procedure: TRANSESOPHAGEAL ECHOCARDIOGRAM (TEE) WITH PROPOFOL;  Surgeon: Arnoldo Lenis, MD;  [facility];  Service: Endoscopy;  Laterality: N/A;       Family History  Problem Relation Age of Onset   Multiple sclerosis Mother    Psoriasis Mother    Depression Father    Diabetes Father    Diabetes Paternal Grandmother     Social History   Tobacco Use   Smoking status: Some Days    Types: Cigarettes   Smokeless tobacco: Never  Vaping Use   Vaping Use: Never used  Substance Use Topics   Alcohol use: No   Drug use: No    Home Medications Prior to Admission medications   Medication Sig Start Date End Date Taking? Authorizing Provider  amiodarone (PACERONE) 200 MG tablet Take 200 mg by mouth daily. Patient not taking: Reported on 05/23/2021    [provider]  apixaban (ELIQUIS) 5 MG TABS tablet Take 1 tablet (5 mg total) by mouth 2 (two) times daily. 03/18/21   [name], MD  ferrous sulfate 325 (65 FE) MG tablet Take 1 tablet (325 mg total) by mouth daily. Patient taking differently: Take 325 mg by mouth in the morning. 12/23/18   [name], MD  furosemide (LASIX) 20 MG tablet Take 1 tablet (20 mg total) by mouth daily. With 80 mg tablet 03/18/21    [name], MD  furosemide (LASIX) 40 MG tablet Take 1 tablet by mouth daily. 03/13/21   [provider]  furosemide (LASIX) 80 MG tablet Take 1 tablet (80 mg total) by mouth daily. With 20 mg tablet 03/18/21   [name], MD  metoprolol succinate (TOPROL-XL) 50 MG 24 hr tablet Take 50 mg by mouth daily. Take with or immediately following a meal.    [provider]  potassium chloride SA (KLOR-CON M) 20 MEQ tablet Take 20 mEq by mouth daily.    [provider]  sacubitril-valsartan (ENTRESTO) 24-26 MG Take 1 tablet by mouth 2 (two) times daily. 08/27/20   Bensimhon, Bevelyn Buckles, MD  spironolactone (ALDACTONE) 25 MG tablet Take 0.5 tablets (12.5 mg total) by mouth daily. 03/18/21   [name], MD    Allergies    Patient has no known allergies.  Review of Systems   Review of Systems  Constitutional:  Negative for appetite change.  Respiratory:  Positive for cough and shortness of breath.   Cardiovascular:  Positive for leg swelling.  Gastrointestinal:  Negative for abdominal pain.  Genitourinary:  Negative for flank pain.  Musculoskeletal:  Negative for back pain.  Skin:  Negative for rash.  Neurological:  Negative for weakness.  Psychiatric/Behavioral:  Negative for confusion.    Physical Exam Updated Vital Signs BP 105/71    Pulse 96    Temp 97.6 F (36.4 C) (Oral)    Resp 17    Ht 5\' 9"  (1.753 m)    Wt 95.7 kg    SpO2 98%    BMI 31.16 kg/m   Physical Exam Vitals and nursing note reviewed.  HENT:     Head: Normocephalic.  Cardiovascular:     Rate and Rhythm: Normal rate and regular rhythm.  Pulmonary:     Breath sounds: Rales present.     Comments: Harsh breath sounds bilateral bases with some rales. Chest:     Chest wall: No tenderness.  Musculoskeletal:     Right lower leg: Edema present.     Left lower leg: Edema present.     Comments: Pitting edema bilateral lower extremities.  Skin:    General: Skin is warm.     Capillary  Refill: Capillary refill takes less than 2 seconds.  Neurological:     Mental Status: He is alert and oriented to person, place, and time.    ED Results / Procedures / Treatments   Labs (all labs ordered are listed, but only abnormal results are displayed) Labs Reviewed  BRAIN NATRIURETIC PEPTIDE - Abnormal; Notable for the following components:      Result Value   B  Natriuretic Peptide 2,863.0 (*)    All other components within normal limits  COMPREHENSIVE METABOLIC PANEL - Abnormal; Notable for the following components:   Sodium 134 (*)    Potassium 3.2 (*)    Glucose, Bld 158 (*)    BUN 26 (*)    Creatinine, Ser 1.71 (*)    Albumin 3.4 (*)    AST 56 (*)    Total Bilirubin 1.4 (*)    GFR, Estimated 54 (*)    All other components within normal limits  TROPONIN I (HIGH SENSITIVITY) - Abnormal; Notable for the following components:   Troponin I (High Sensitivity) 138 (*)    All other components within normal limits  RESP PANEL BY RT-PCR (FLU A&B, COVID) ARPGX2  CBC WITH DIFFERENTIAL/PLATELET  TROPONIN I (HIGH SENSITIVITY)    EKG None  Radiology DG Chest 2 View  Result Date: 05/29/2021 CLINICAL DATA:  Shortness of breath with history of CHF. EXAM: CHEST - 2 VIEW COMPARISON:  Recent PA and lateral chest 05/23/2021. FINDINGS: Moderate to severe cardiomegaly is again noted with old sternotomy sutures and prosthetic aortic valve. Central vascular distension and flow cephalization continue to be seen, without overt edema. The lungs are clear of infiltrates.  No pleural effusion is seen. IMPRESSION: Stable cardiomegaly and pulmonary vascular congestion without evidence of acute edema. Electronically Signed   By: Telford Nab M.D.   On: 05/29/2021 22:42    Procedures Procedures   Medications Ordered in ED Medications - No data to display  ED Course  I have reviewed the triage vital signs and the nursing notes.  Pertinent labs & imaging results that were available during my  care of the patient were reviewed by me and considered in my medical decision making (see chart for details).    MDM Rules/Calculators/A&P                           Patient presents with shortness of breath and fatigue.  Worse over the last few days.  Had been seen about a week ago in the ER.  Plan to follow-up with Dr. Harl Bowie tomorrow.  However has been more short of breath.  States his weight is up.  Has edema on his legs.  States he normally does not have any.  Chest x-ray showed pulmonary vascular congestion without edema.  However troponin is above his baseline at 138 which appears baseline is more around 60.  Also BNP is elevated.  Also blood pressures running on the lower side and creatinine is increased.  Has a known severe mitral regurg and likely require mitral valve replacement.  I think patient will require admission to the hospital.  Will need cardiology consult and likely echocardiogram which could be done at [facility] tomorrow.  Discussed with Dr. Josephine Cables.  He recommends discussion with cardiology about dispo before admitting.  May need to go to [facility] primarily  Cardiology fellow Dr.Ugowe thinks that the patient can stay up at [facility] for the initial evaluation.  Will admit to hospitalist  Final Clinical Impression(s) / ED Diagnoses Final diagnoses:  None    Rx / DC Orders ED Discharge Orders     None        Davonna Belling, MD 05/30/21 0004

## 2021-05-29 NOTE — ED Triage Notes (Signed)
SOB x 1 day worse today. Hx of CHF, productive cough, denies any current pain.

## 2021-05-29 NOTE — ED Notes (Signed)
Patient transported to X-ray 

## 2021-05-30 ENCOUNTER — Ambulatory Visit: Payer: Self-pay | Admitting: Cardiology

## 2021-05-30 ENCOUNTER — Inpatient Hospital Stay: Payer: Self-pay

## 2021-05-30 ENCOUNTER — Other Ambulatory Visit (HOSPITAL_COMMUNITY): Payer: Medicaid Other

## 2021-05-30 DIAGNOSIS — E46 Unspecified protein-calorie malnutrition: Secondary | ICD-10-CM

## 2021-05-30 DIAGNOSIS — R570 Cardiogenic shock: Secondary | ICD-10-CM | POA: Diagnosis not present

## 2021-05-30 DIAGNOSIS — R7989 Other specified abnormal findings of blood chemistry: Secondary | ICD-10-CM

## 2021-05-30 DIAGNOSIS — I252 Old myocardial infarction: Secondary | ICD-10-CM | POA: Diagnosis not present

## 2021-05-30 DIAGNOSIS — Z86718 Personal history of other venous thrombosis and embolism: Secondary | ICD-10-CM | POA: Diagnosis not present

## 2021-05-30 DIAGNOSIS — R778 Other specified abnormalities of plasma proteins: Secondary | ICD-10-CM

## 2021-05-30 DIAGNOSIS — I428 Other cardiomyopathies: Secondary | ICD-10-CM | POA: Diagnosis present

## 2021-05-30 DIAGNOSIS — N179 Acute kidney failure, unspecified: Secondary | ICD-10-CM

## 2021-05-30 DIAGNOSIS — I509 Heart failure, unspecified: Secondary | ICD-10-CM

## 2021-05-30 DIAGNOSIS — I480 Paroxysmal atrial fibrillation: Secondary | ICD-10-CM | POA: Diagnosis not present

## 2021-05-30 DIAGNOSIS — I5043 Acute on chronic combined systolic (congestive) and diastolic (congestive) heart failure: Secondary | ICD-10-CM

## 2021-05-30 DIAGNOSIS — I5020 Unspecified systolic (congestive) heart failure: Secondary | ICD-10-CM

## 2021-05-30 DIAGNOSIS — I5023 Acute on chronic systolic (congestive) heart failure: Secondary | ICD-10-CM | POA: Diagnosis not present

## 2021-05-30 DIAGNOSIS — E871 Hypo-osmolality and hyponatremia: Secondary | ICD-10-CM | POA: Diagnosis not present

## 2021-05-30 DIAGNOSIS — I272 Pulmonary hypertension, unspecified: Secondary | ICD-10-CM | POA: Diagnosis not present

## 2021-05-30 DIAGNOSIS — R739 Hyperglycemia, unspecified: Secondary | ICD-10-CM | POA: Diagnosis present

## 2021-05-30 DIAGNOSIS — L88 Pyoderma gangrenosum: Secondary | ICD-10-CM | POA: Diagnosis present

## 2021-05-30 DIAGNOSIS — I5082 Biventricular heart failure: Secondary | ICD-10-CM | POA: Diagnosis present

## 2021-05-30 DIAGNOSIS — E8809 Other disorders of plasma-protein metabolism, not elsewhere classified: Secondary | ICD-10-CM

## 2021-05-30 DIAGNOSIS — Z20822 Contact with and (suspected) exposure to covid-19: Secondary | ICD-10-CM | POA: Diagnosis not present

## 2021-05-30 DIAGNOSIS — J9601 Acute respiratory failure with hypoxia: Secondary | ICD-10-CM | POA: Diagnosis not present

## 2021-05-30 DIAGNOSIS — E876 Hypokalemia: Secondary | ICD-10-CM

## 2021-05-30 DIAGNOSIS — I340 Nonrheumatic mitral (valve) insufficiency: Secondary | ICD-10-CM

## 2021-05-30 DIAGNOSIS — I4892 Unspecified atrial flutter: Secondary | ICD-10-CM

## 2021-05-30 DIAGNOSIS — E669 Obesity, unspecified: Secondary | ICD-10-CM | POA: Diagnosis not present

## 2021-05-30 DIAGNOSIS — I081 Rheumatic disorders of both mitral and tricuspid valves: Secondary | ICD-10-CM | POA: Diagnosis not present

## 2021-05-30 DIAGNOSIS — R7401 Elevation of levels of liver transaminase levels: Secondary | ICD-10-CM

## 2021-05-30 DIAGNOSIS — I472 Ventricular tachycardia, unspecified: Secondary | ICD-10-CM | POA: Diagnosis present

## 2021-05-30 DIAGNOSIS — F17210 Nicotine dependence, cigarettes, uncomplicated: Secondary | ICD-10-CM | POA: Diagnosis present

## 2021-05-30 DIAGNOSIS — I5021 Acute systolic (congestive) heart failure: Secondary | ICD-10-CM | POA: Diagnosis present

## 2021-05-30 DIAGNOSIS — Z6832 Body mass index (BMI) 32.0-32.9, adult: Secondary | ICD-10-CM | POA: Diagnosis not present

## 2021-05-30 DIAGNOSIS — R9431 Abnormal electrocardiogram [ECG] [EKG]: Secondary | ICD-10-CM

## 2021-05-30 LAB — COMPREHENSIVE METABOLIC PANEL
ALT: 27 U/L (ref 0–44)
ALT: 31 U/L (ref 0–44)
AST: 49 U/L — ABNORMAL HIGH (ref 15–41)
AST: 56 U/L — ABNORMAL HIGH (ref 15–41)
Albumin: 3.1 g/dL — ABNORMAL LOW (ref 3.5–5.0)
Albumin: 3 g/dL — ABNORMAL LOW (ref 3.5–5.0)
Alkaline Phosphatase: 91 U/L (ref 38–126)
Alkaline Phosphatase: 99 U/L (ref 38–126)
Anion gap: 13 (ref 5–15)
Anion gap: 14 (ref 5–15)
BUN: 28 mg/dL — ABNORMAL HIGH (ref 6–20)
BUN: 32 mg/dL — ABNORMAL HIGH (ref 6–20)
CO2: 20 mmol/L — ABNORMAL LOW (ref 22–32)
CO2: 18 mmol/L — ABNORMAL LOW (ref 22–32)
Calcium: 8.7 mg/dL — ABNORMAL LOW (ref 8.9–10.3)
Calcium: 9.1 mg/dL (ref 8.9–10.3)
Chloride: 100 mmol/L (ref 98–111)
Chloride: 100 mmol/L (ref 98–111)
Creatinine, Ser: 1.64 mg/dL — ABNORMAL HIGH (ref 0.61–1.24)
Creatinine, Ser: 2.07 mg/dL — ABNORMAL HIGH (ref 0.61–1.24)
GFR, Estimated: 56 mL/min — ABNORMAL LOW (ref >60)
GFR, Estimated: 43 mL/min — ABNORMAL LOW (ref >60)
Glucose, Bld: 130 mg/dL — ABNORMAL HIGH (ref 70–99)
Glucose, Bld: 138 mg/dL — ABNORMAL HIGH (ref 70–99)
Potassium: 3.8 mmol/L (ref 3.5–5.1)
Potassium: 4.7 mmol/L (ref 3.5–5.1)
Sodium: 133 mmol/L — ABNORMAL LOW (ref 135–145)
Sodium: 132 mmol/L — ABNORMAL LOW (ref 135–145)
Total Bilirubin: 1 mg/dL (ref 0.3–1.2)
Total Bilirubin: 1.8 mg/dL — ABNORMAL HIGH (ref 0.3–1.2)
Total Protein: 7.3 g/dL (ref 6.5–8.1)
Total Protein: 7.4 g/dL (ref 6.5–8.1)

## 2021-05-30 LAB — CBC
HCT: 42.8 % (ref 39.0–52.0)
HCT: 43.5 % (ref 39.0–52.0)
Hemoglobin: 14 g/dL (ref 13.0–17.0)
Hemoglobin: 14.4 g/dL (ref 13.0–17.0)
MCH: 28.7 pg (ref 26.0–34.0)
MCH: 28.2 pg (ref 26.0–34.0)
MCHC: 32.7 g/dL (ref 30.0–36.0)
MCHC: 33.1 g/dL (ref 30.0–36.0)
MCV: 87.7 fL (ref 80.0–100.0)
MCV: 85.3 fL (ref 80.0–100.0)
Platelets: 222 10*3/uL (ref 150–400)
Platelets: 256 10*3/uL (ref 150–400)
RBC: 4.88 MIL/uL (ref 4.22–5.81)
RBC: 5.1 MIL/uL (ref 4.22–5.81)
RDW: 15.3 % (ref 11.5–15.5)
RDW: 14.8 % (ref 11.5–15.5)
WBC: 9.3 10*3/uL (ref 4.0–10.5)
WBC: 11 10*3/uL — ABNORMAL HIGH (ref 4.0–10.5)
nRBC: 0 % (ref 0.0–0.2)
nRBC: 0 % (ref 0.0–0.2)

## 2021-05-30 LAB — APTT
aPTT: 30 seconds (ref 24–36)
aPTT: 36 seconds (ref 24–36)

## 2021-05-30 LAB — RESP PANEL BY RT-PCR (FLU A&B, COVID) ARPGX2
Influenza A by PCR: NEGATIVE
Influenza B by PCR: NEGATIVE
SARS Coronavirus 2 by RT PCR: NEGATIVE

## 2021-05-30 LAB — PHOSPHORUS: Phosphorus: 4.2 mg/dL (ref 2.5–4.6)

## 2021-05-30 LAB — RAPID URINE DRUG SCREEN, HOSP PERFORMED
Amphetamines: NOT DETECTED
Barbiturates: NOT DETECTED
Benzodiazepines: NOT DETECTED
Cocaine: NOT DETECTED
Opiates: NOT DETECTED
Tetrahydrocannabinol: POSITIVE — AB

## 2021-05-30 LAB — LACTIC ACID, PLASMA
Lactic Acid, Venous: 3.5 mmol/L (ref 0.5–1.9)
Lactic Acid, Venous: 2.5 mmol/L (ref 0.5–1.9)

## 2021-05-30 LAB — HIV ANTIBODY (ROUTINE TESTING W REFLEX): HIV Screen 4th Generation wRfx: NONREACTIVE

## 2021-05-30 LAB — MRSA NEXT GEN BY PCR, NASAL: MRSA by PCR Next Gen: NOT DETECTED

## 2021-05-30 LAB — TROPONIN I (HIGH SENSITIVITY): Troponin I (High Sensitivity): 126 ng/L (ref <18)

## 2021-05-30 LAB — MAGNESIUM: Magnesium: 1.6 mg/dL — ABNORMAL LOW (ref 1.7–2.4)

## 2021-05-30 MED ORDER — POTASSIUM CHLORIDE CRYS ER 20 MEQ PO TBCR
40.0000 meq | EXTENDED_RELEASE_TABLET | Freq: Once | ORAL | Status: AC
  Administered 2021-05-30: 40 meq via ORAL
  Filled 2021-05-30: qty 2

## 2021-05-30 MED ORDER — MILRINONE LACTATE IN DEXTROSE 20-5 MG/100ML-% IV SOLN
0.5000 ug/kg/min | INTRAVENOUS | Status: DC
  Administered 2021-05-30 – 2021-06-01 (×2): 0.25 ug/kg/min via INTRAVENOUS
  Administered 2021-06-01 – 2021-06-02 (×2): 0.375 ug/kg/min via INTRAVENOUS
  Administered 2021-06-02: 19:00:00 0.5 ug/kg/min via INTRAVENOUS
  Administered 2021-06-02: 11:00:00 0.375 ug/kg/min via INTRAVENOUS
  Administered 2021-06-03 – 2021-06-04 (×6): 0.5 ug/kg/min via INTRAVENOUS
  Filled 2021-05-30 (×13): qty 100

## 2021-05-30 MED ORDER — FUROSEMIDE 10 MG/ML IJ SOLN
80.0000 mg | Freq: Two times a day (BID) | INTRAMUSCULAR | Status: DC
  Administered 2021-05-30 – 2021-06-04 (×10): 80 mg via INTRAVENOUS
  Filled 2021-05-30 (×10): qty 8

## 2021-05-30 MED ORDER — SODIUM CHLORIDE 0.9% FLUSH
3.0000 mL | Freq: Two times a day (BID) | INTRAVENOUS | Status: DC
  Administered 2021-05-30 – 2021-06-04 (×10): 3 mL via INTRAVENOUS

## 2021-05-30 MED ORDER — APIXABAN 5 MG PO TABS
5.0000 mg | ORAL_TABLET | Freq: Two times a day (BID) | ORAL | Status: DC
  Administered 2021-05-30: 5 mg via ORAL
  Filled 2021-05-30: qty 1

## 2021-05-30 MED ORDER — SODIUM CHLORIDE 0.9 % IV SOLN
250.0000 mL | INTRAVENOUS | Status: DC | PRN
  Administered 2021-05-31: 250 mL via INTRAVENOUS

## 2021-05-30 MED ORDER — ACETAMINOPHEN 325 MG PO TABS
650.0000 mg | ORAL_TABLET | ORAL | Status: DC | PRN
  Administered 2021-05-30 – 2021-06-04 (×9): 650 mg via ORAL
  Filled 2021-05-30 (×9): qty 2

## 2021-05-30 MED ORDER — FUROSEMIDE 10 MG/ML IJ SOLN
20.0000 mg | Freq: Two times a day (BID) | INTRAMUSCULAR | Status: DC
  Administered 2021-05-30: 20 mg via INTRAVENOUS
  Filled 2021-05-30: qty 2

## 2021-05-30 MED ORDER — FUROSEMIDE 10 MG/ML IJ SOLN
40.0000 mg | Freq: Two times a day (BID) | INTRAMUSCULAR | Status: DC
  Administered 2021-05-30: 40 mg via INTRAVENOUS
  Filled 2021-05-30: qty 4

## 2021-05-30 MED ORDER — METOPROLOL SUCCINATE ER 50 MG PO TB24: 50.0000 mg | ORAL_TABLET | Freq: Every day | ORAL | Status: DC

## 2021-05-30 MED ORDER — HEPARIN (PORCINE) 25000 UT/250ML-% IV SOLN
1550.0000 [IU]/h | INTRAVENOUS | Status: DC
  Administered 2021-05-30 – 2021-05-31 (×2): 1400 [IU]/h via INTRAVENOUS
  Administered 2021-06-01: 1450 [IU]/h via INTRAVENOUS
  Administered 2021-06-01 – 2021-06-04 (×4): 1550 [IU]/h via INTRAVENOUS
  Filled 2021-05-30 (×7): qty 250

## 2021-05-30 MED ORDER — GLUCERNA SHAKE PO LIQD
237.0000 mL | Freq: Three times a day (TID) | ORAL | Status: DC
  Administered 2021-05-30 – 2021-05-31 (×2): 237 mL via ORAL
  Filled 2021-05-30 (×5): qty 237

## 2021-05-30 MED ORDER — SPIRONOLACTONE 25 MG PO TABS
12.5000 mg | ORAL_TABLET | Freq: Every day | ORAL | Status: DC
  Filled 2021-05-30 (×2): qty 0.5

## 2021-05-30 MED ORDER — SACUBITRIL-VALSARTAN 24-26 MG PO TABS: 1.0000 | ORAL_TABLET | Freq: Two times a day (BID) | ORAL | Status: DC

## 2021-05-30 MED ORDER — POTASSIUM CHLORIDE CRYS ER 20 MEQ PO TBCR
20.0000 meq | EXTENDED_RELEASE_TABLET | Freq: Every day | ORAL | Status: DC
  Administered 2021-05-30 – 2021-06-01 (×3): 20 meq via ORAL
  Filled 2021-05-30 (×3): qty 1

## 2021-05-30 MED ORDER — SODIUM CHLORIDE 0.9% FLUSH: 3.0000 mL | INTRAVENOUS | Status: DC | PRN

## 2021-05-30 MED ORDER — FERROUS SULFATE 325 (65 FE) MG PO TABS
325.0000 mg | ORAL_TABLET | Freq: Every day | ORAL | Status: DC
  Administered 2021-05-30 – 2021-06-04 (×6): 325 mg via ORAL
  Filled 2021-05-30 (×6): qty 1

## 2021-05-30 NOTE — ED Notes (Addendum)
PA aware of increased work of breathing and RR rate.

## 2021-05-30 NOTE — Progress Notes (Signed)
° ° °  Notified by patient's nurse he has not experienced any urine output since his AM Lasix dose. In-and-out cath with 200 cc. Pressures remain soft with SBP in the 90's to low-100's. Toprol-XL and Spironolactone were already held this AM but will discontinue for now. Clinical picture concerning for cardiogenic shock in the setting of his severe MR. Discussed with Card Master and Dr. Gala Romney and will request bed on 2H. Will need PICC and likely initiation of inotropic support upon arrival.   Signed, Ellsworth Lennox, PA-C 05/30/2021, 10:37 AM

## 2021-05-30 NOTE — ED Notes (Signed)
No urine output at this time. Will attempt to in and out.

## 2021-05-30 NOTE — Progress Notes (Signed)
ANTICOAGULATION CONSULT NOTE  Pharmacy Consult for heparin Indication: atrial fibrillation  Heparin Dosing Weight: 92 kg  Labs: Recent Labs    05/29/21 2214 05/30/21 0610  HGB 14.4 14.0  HCT 43.5 42.8  PLT 241 222  APTT  --  30  CREATININE 1.71* 1.64*    Assessment: 35 yom on apixaban PTA for hx afib presenting with acute on chronic biventricular heart failure, cardiogenic shock. Pharmacy consulted to transition apixaban to heparin. Heart failure service planning to discuss plans with [facility] for surgery options. Hg wnl, plt down to 126. Noted AKI - SCr down a bit to 1.64 today. No bleed issues reported. Last dose of apixaban 12/15 at 0918 inpatient. Will monitor using aPTT while apixaban expected to influence heparin levels.  Goal of Therapy:  Heparin level 0.3-0.7 units/ml aPTT 66-102 seconds Monitor platelets by anticoagulation protocol: Yes   Plan:  No bolus with recent apixaban Start heparin at 1400 units/hr at time next dose of apixaban would be due (12/15 at 2115) Check 6hr aPTT from start Monitor daily heparin level and aPTT until correlating, CBC, s/sx bleeding F/u Cardiology plans   Leia Alf, PharmD, BCPS Clinical Pharmacist 05/30/2021 6:20 PM

## 2021-05-30 NOTE — ED Notes (Signed)
Attempted to call report x1. Secretary stated that nurse would call me back.

## 2021-05-30 NOTE — ED Notes (Signed)
RT called at this time to assess pt.

## 2021-05-30 NOTE — ED Notes (Addendum)
This nurse notified PA of SBP prior to administration of lasix. PA informed nurse to administer lasix and hold other morning meds if needed for BP.

## 2021-05-30 NOTE — Consult Note (Addendum)
Advanced Heart Failure Team Consult Note   Primary Physician: Donald [name], No Pcp Per (Inactive) PCP-Cardiologist:  Dina Rich, MD [facility]: Dr. Gala Romney   Reason for Consultation: Acute on chronic systolic heart failure>>cardiogenic shock in setting of severe Mitral Regurgitation   HPI:    Donald [name] is seen today for evaluation of acute on chronic systolic heart failure>>cardiogenic shock in setting of severe mitral regurgitation  at the request of Dr. Diona Browner, Cardiology.   Donald [name] is a 35 y.o. 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 Diona Browner for further evaluation of mitiral regurgitation and cardiogenic shock.   Reports being healthy until late in 2019. In 12/19 presented to [facility] in respiratory failure. Intubated. Suffered cardiac arrest on induction of anesthesia. Subsequent echo revealed acute severe mitral regurgitation with anterior leaflet prolapse. He was taken for urgent mitral valve repair by Dr. Emelda Fear on June 07, 2018 where there was reported evidence of ruptured anterior papillary muscle.   The Donald [name] has been followed by Dr. Wyline Mood. Donald [name] has had multiple episodes of acute exacerbation of fluid overload with at least one ER visit for acute pulmonary edema although he did not require hospitalization. Echo 11/21 EF 45% with  severe MR. TEE. 12/21 EF 40 to 45% with severe MR with multiple jets including a possible paravalvular leak, severe TR. The RV was  moderate to severely HK with evidence of significant PH He has had several episodes of AFL with RVR, requiring DCCVs.    He saw Dr. Cornelius Moras in 2/22 for consideration of repeat MV surgery and subsequently referred to the [facility] for CHF evaluation/ optimization and RHC prior to potential re-do MVR. Saw Dr. Gala Romney for consultation and set up for [facility] 09/11/20 which showed normal coronaries, NICM EF 30-35%, severe MR w/ prominent v-waves in PCWP tracing, mild pulmonary venous HTN with normal CO (Fick cardiac output/index = 5.3/2.5).  He was scheduled to f/u w/ Dr. Cornelius Moras in early April to further discuss surgery but he canceled the appt. Appointment was rescheduled but he cacellled again. Was also arranged to undergo dental extractions by Dr. Chales Salmon but canceled as well.   9/22 went to ED w/ atypical CP and found to be in Afib w/ RVR. Cardioverted in ED and sent home.   Over the last several weeks, he has experienced progressive SOB, LEE and weight gain + orthopnea/PND. Worse in the last 2-3 days. Initially presented to [facility] and found to be in acute on chronic CHF.   Initial labs show WBC 10.1, Hgb 14.4, platelets 241, Na+ 134, K+ 3.2 and creatinine 1.71 (baseline 0.9 -1.0). BNP 2863. Initial Hs Troponin 138 with repeat of 126. CXR showed stable cardiomegaly and pulmonary vascular congestion without acute pulmonary edema. EKG ST, 102 with first-degree AV block and biatrial enlargement.COVID and flu negative. UDS + for THC.   He was admitted and started on IV lasix w/ minimal response to diuretics and progression to cardiogenic shock. Hypotensive. Diminished UOP. Transferred to [facility] for further management.   A&O x 3 on arrival. Slight increased WOB. O2 sats 97%  on 2L [place]. BP 125/97. Sinus tach 103 bpm.    R/LHC 09/11/20 Findings:   RA = 7 RV = 49/10 PA = 48/16 (32) PCW = 17 (v=32) Fick cardiac output/index = 5.3/2.5 PVR = 2.9 WU Ao sat = 99% PA sat = 71%, 70% High SVC sat =  75%    Assessment:   1. Normal coronary arteries 2. NICM EF 30-35% 3. Severe MR with prominent v-waves in PCWP tracing 4. Mild pulmonary venous HTN with normal CO    TEE 12/21 Left ventricular ejection fraction, by estimation, is 40 to 45%. The left ventricle has mildly decreased function. 2. Right ventricular systolic function moderately to severely reduced. The right ventricular size is moderately enlarged. 3. Left atrial size was severely dilated. No left atrial/left atrial appendage thrombus was detected. The LAA emptying velocity was 60 cm/s. 4. Right atrial size was severely dilated. 5. Difficult visualization of mitral valve and regurgitant jet. Findings consistent with prior repair. Thickened leaflets with restricted motion. The MR jet was not amenable to PISA. Moderate mean gradient across the valve of 6 mmHg. In images #[id number] there appears to be regurgitation jet through the valve but also lateral to the anular ring. The cumulative regurgitation appears to be severe. . The mitral valve has been repaired/replaced. Severe mitral valve regurgitation. Moderate mitral stenosis. 6. The tricuspid valve is abnormal. Tricuspid valve regurgitation is moderate to severe. 7. The aortic valve is tricuspid. Aortic valve regurgitation is not visualized. No aortic stenosis is present. 8. Technically difficult study, standard views were off axis particularly of the mitral valve.  Review of Systems: [y] = yes, [ ]  = no   General: Weight gain [ Y]; Weight loss [ ] ; Anorexia [ ] ; Fatigue [ y]; Fever [ ] ; Chills [ ] ; Weakness [ ]   Cardiac: Chest pain/pressure [ ] ; Resting SOB [ Y]; Exertional SOB [ Y]; Orthopnea [ Y]; Pedal Edema [ Y]; Palpitations [ ] ; Syncope [ ] ; Presyncope [ ] ; Paroxysmal nocturnal dyspnea[ Y]  Pulmonary: Cough [ ] ; Wheezing[ ] ; Hemoptysis[ ] ; Sputum [ ] ; Snoring [ ]   GI: Vomiting[ ] ; Dysphagia[ ] ; Melena[ ] ; Hematochezia [ ] ; Heartburn[ ] ; Abdominal pain [ ] ; Constipation [ ] ;  Diarrhea [ ] ; BRBPR [ ]   GU: Hematuria[ ] ; Dysuria [ ] ; Nocturia[ ]   Vascular: Pain in legs with walking [ ] ; Pain in feet with lying flat [ ] ; Non-healing sores [ ] ; Stroke [ ] ; TIA [ ] ; Slurred speech [ ] ;  Neuro: Headaches[ ] ; Vertigo[ ] ; Seizures[ ] ; Paresthesias[ ] ;Blurred vision [ ] ; Diplopia [ ] ; Vision changes [ ]   Ortho/Skin: Arthritis [ y]; Joint pain Blue.Reese ]; Muscle pain [ ] ; Joint swelling [ ] ; Back Pain [ ] ; Rash [ ]   Psych: Depression[ ] ; Anxiety[ ]   Heme: Bleeding problems [ ] ; Clotting disorders [ ] ; Anemia [ ]   Endocrine: Diabetes [ ] ; Thyroid dysfunction[ ]   Home Medications Prior to Admission medications   Medication Sig Start Date End Date Taking? Authorizing Provider  apixaban (ELIQUIS) 5 MG TABS tablet Take 1 tablet (5 mg total) by mouth 2 (two) times daily. 03/18/21  Yes BranchAlphonse Guild, MD  ferrous sulfate 325 (65 FE) MG tablet Take 1 tablet (325 mg total) by mouth daily. Donald [name] taking differently: Take 325 mg by mouth in the morning. 12/23/18  Yes Branch, Alphonse Guild, MD  furosemide (LASIX) 20 MG tablet Take 1 tablet (20 mg total) by mouth daily. With 80 mg tablet 03/18/21  Yes Branch, Roderic Palau  F, MD  furosemide (LASIX) 80 MG tablet Take 1 tablet (80 mg total) by mouth daily. With 20 mg tablet 03/18/21  Yes Branch, Alphonse Guild, MD  metoprolol succinate (TOPROL-XL) 50 MG 24 hr tablet Take 50 mg by mouth daily. Take with or immediately following a meal.   Yes [provider]  potassium chloride SA (KLOR-CON M) 20 MEQ tablet Take 20 mEq by mouth daily.   Yes [provider]  sacubitril-valsartan (ENTRESTO) 24-26 MG Take 1 tablet by mouth 2 (two) times daily. 08/27/20  Yes [name] [name], Shaune Pascal, MD  spironolactone (ALDACTONE) 25 MG tablet Take 0.5 tablets (12.5 mg total) by mouth daily. 03/18/21  Yes BranchAlphonse Guild, MD    Past Medical History: Past Medical History:  Diagnosis Date   Anemia    Autoimmune disorder ([place])    pyoderma gangrenosum   CHF  (congestive heart failure) ([place])    Chronic systolic heart failure ([place])    a. EF 35-40% by echo in 07/2018 b. EF at 45% by repeat echo in 04/2020   DVT (deep venous thrombosis) (HCC)    h/o   Dysrhythmia    Mitral regurgitation    a. 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 b. 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HCC)    arthritis   Tricuspid regurgitation     Past Surgical History: Past Surgical History:  Procedure Laterality Date   CARDIAC CATHETERIZATION     HERNIA REPAIR Right 2018   MITRAL VALVE REPAIR  06/07/2018   [place] - Dr Virgina Organ   MULTIPLE EXTRACTIONS WITH ALVEOLOPLASTY N/A 11/08/2020   Procedure: EXTRACTION OF TEETH NUMBER ONE, FIFTHTEEN, SIXTEEN, SEVENTEEN, EIGHTTEEN AND THRTY WITH ALVEOLOPLASTY OF LOWER LEFT QUADRANT.;  Surgeon: Charlaine Dalton, DMD;  Location: [place];  Service: Dentistry;  Laterality: N/A;   RIGHT/LEFT HEART CATH AND CORONARY ANGIOGRAPHY N/A 09/11/2020   Procedure: RIGHT/LEFT HEART CATH AND CORONARY ANGIOGRAPHY;  Surgeon: Jolaine Artist, MD;  Location: [place] [facility];  Service: Cardiovascular;  Laterality: N/A;   TEE WITHOUT CARDIOVERSION N/A 05/23/2020   Procedure: TRANSESOPHAGEAL ECHOCARDIOGRAM (TEE) WITH PROPOFOL;  Surgeon: Arnoldo Lenis, MD;  [facility];  Service: Endoscopy;  Laterality: N/A;    Family History: Family History  Problem Relation Age of Onset   Multiple sclerosis Mother    Psoriasis Mother    Depression Father    Diabetes Father    Diabetes Paternal Grandmother     Social History: Social History   Socioeconomic History   Marital status: Divorced    Spouse name: Not on file   Number of children: Not on file   Years of education: Not on file    Highest education level: Not on file  Occupational History   Not on file  Tobacco Use   Smoking status: Some Days    Types: Cigarettes   Smokeless tobacco: Never  Vaping Use   Vaping Use: Never used  Substance and Sexual Activity   Alcohol use: No   Drug use: No   Sexual activity: Yes  Other Topics Concern   Not on file  Social History Narrative   Not on file   Social Determinants of Health   Financial Resource Strain: Not on file  Food  Insecurity: Not on file  Transportation Needs: Not on file  Physical Activity: Not on file  Stress: Not on file  Social Connections: Not on file    Allergies:  No Known Allergies  Objective:    Vital Signs:   Temp:  [97.1 F (36.2 C)-97.6 F (36.4 C)] 97.1 F (36.2 C) (12/15 0640) Pulse Rate:  [73-106] 99 (12/15 1345) Resp:  [10-48] 31 (12/15 1345) BP: (83-127)/(43-97) 114/96 (12/15 1330) SpO2:  [89 %-100 %] 100 % (12/15 1345) Weight:  [95.7 kg] 95.7 kg (12/14 2155)    Weight change: Filed Weights   05/29/21 2155  Weight: 95.7 kg    Intake/Output:   Intake/Output Summary (Last 24 hours) at 05/30/2021 1356 Last data filed at 05/30/2021 1056 Gross per 24 hour  Intake 500 ml  Output 500 ml  Net 0 ml      Physical Exam    General: chronically ill appearing young AAM. Slight increased WOB  HEENT: normal Neck: supple. JVP elevated to ear. Carotids 2+ bilat; no bruits. No lymphadenopathy or thyromegaly appreciated. Cor: PMI nondisplaced. Regular rhythm tachy rate. 3/6 MR murmur  Lungs: decreased BS at the bases bilatearlly  Abdomen: soft, nontender, nondistended. No hepatosplenomegaly. No bruits or masses. Good bowel sounds. Extremities: no cyanosis, clubbing, rash, 1+ b/l LE edema. Distal extremities cold  Neuro: alert & orientedx3, cranial nerves grossly intact. moves all 4 extremities w/o difficulty. Affect pleasant   Telemetry   Sinus tach, low 100s   EKG    Sinus tach 102 bpm, lateral Qwaves, LAE   Labs    Basic Metabolic Panel: Recent Labs  Lab 05/23/21 1727 05/29/21 2214 05/30/21 0610  NA 137 134* 133*  K 3.4* 3.2* 3.8  CL 101 98 100  CO2 28 22 20*  GLUCOSE 102* 158* 130*  BUN 13 26* 28*  CREATININE 0.97 1.71* 1.64*  CALCIUM 8.7* 8.9 8.7*  MG  --   --  1.6*  PHOS  --   --  4.2    Liver Function Tests: Recent Labs  Lab 05/23/21 1727 05/29/21 2214 05/30/21 0610  AST 86* 56* 49*  ALT 47* 30 27  ALKPHOS 88 104 91  BILITOT 1.4* 1.4* 1.0  PROT 6.8 8.0 7.3  ALBUMIN 3.3* 3.4* 3.1*   No results for input(s): LIPASE, AMYLASE in the last 168 hours. No results for input(s): AMMONIA in the last 168 hours.  CBC: Recent Labs  Lab 05/23/21 1727 05/29/21 2214 05/30/21 0610  WBC 10.0 10.1 9.3  NEUTROABS 6.3 6.5  --   HGB 13.8 14.4 14.0  HCT 44.2 43.5 42.8  MCV 91.1 88.8 87.7  PLT 201 241 222    Cardiac Enzymes: No results for input(s): CKTOTAL, CKMB, CKMBINDEX, TROPONINI in the last 168 hours.  BNP: BNP (last 3 results) Recent Labs    05/09/21 1945 05/23/21 1727 05/29/21 2214  BNP 1,301.0* 766.0* 2,863.0*    ProBNP (last 3 results) No results for input(s): PROBNP in the last 8760 hours.   CBG: No results for input(s): GLUCAP in the last 168 hours.  Coagulation Studies: No results for input(s): LABPROT, INR in the last 72 hours.   Imaging   DG Chest 2 View  Result Date: 05/29/2021 CLINICAL DATA:  Shortness of breath with history of CHF. EXAM: CHEST - 2 VIEW COMPARISON:  Recent PA and lateral chest 05/23/2021. FINDINGS: Moderate to severe cardiomegaly is again noted with old sternotomy sutures and prosthetic aortic valve. Central vascular distension and flow cephalization continue to  be seen, without overt edema. The lungs are clear of infiltrates.  No pleural effusion is seen. IMPRESSION: Stable cardiomegaly and pulmonary vascular congestion without evidence of acute edema. Electronically Signed   By: Almira Bar M.D.   On: 05/29/2021 22:42      Medications:     Current Medications:  apixaban  5 mg Oral BID   feeding supplement (GLUCERNA SHAKE)  237 mL Oral TID BM   ferrous sulfate  325 mg Oral Daily   furosemide  40 mg Intravenous Q12H   potassium chloride SA  20 mEq Oral Daily    Infusions:     Assessment/Plan   1. Acute on Chronic Biventricular Heart Failure>>Cardiogenic Shock  - likely primarily valvular in nature in setting of recurrent severe MR - TEE. 12/21 EF 40 to 45% with severe MR with multiple jets including a possible paravalvular leak. Miderate RV dysfunction severe TR - [facility] 3/22 showed normal coronaries, NICM EF 30-35%, severe MR w/ prominent v-waves in PCWP tracing, mild pulmonary venous HTN with normal CO (Fick cardiac output/index = 5.3/2.5). - Referred for re-do MVR 4/22 but did not follow-up w/ Dr. Cornelius Moras - Now admitted w/ a/c CHF w/ marked fluid overload, NYHA Class IIIb symptoms and CGS - Establish central line access + repeat RHC  - Start milrinone for afterload reduction/ lowering of PA pressures - may need mechanical support as bridge to surgery  - check lactic acid and follow co-ox - Diuresis w/ IV Lasix - Hold ? blocker w/ shock  - Repeat echo/TEE  - Will need to discuss w/ [facility], transfer for re-do MV repair vs replacement   2. Severe Mitral Valve Regurgitation  - s/p MV repair with resection of ruptured anterior papillary muscle and reconstruction of papillary chord and placement of annuloplasty ring in 2019 at [facility] - TEE 12/21 EF 40 to 45% with severe MR with multiple jets including a possible paravalvular leak. Miderate RV dysfunction severe TR - Referred for re-do MV repair 4/22 but decline surgery  - Now w/ acute decompensated HF/Shock  - optimize from CHF standpoint and try to stabilize for potential transfer to [facility] for re-do repair vs replacement   3. Paroxysmal Atrial Flutter  - several episodes in setting of severe LAE - last DC-CV in 9/22  - Admit EKG Sinus tach  -  Monitor w/ Milrinone. Low threshold for amio gtt  - Hold Eliquis. Cover w/ heparin gtt  - Keep K > 4.0 and Mg >2.0  - If redo MVR, should get MAZE   4. 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 AKI  - SCr 1.7 on admit (baseline <1.0) - cardiorenal/ low output 2/2 severe MR  - inotropic support per above - may need mechanical support for stabilization   6. Autoimmune disorder - He has known seronegative spondylitis and pyoderma gangrenosum.  - Previously followed by Rheumatology at [facility].      Length of Stay: 0  Knute Neu  05/30/2021, 1:56 PM  Advanced Heart Failure Team Pager [phone] (M-F; 7a - 5p)  Please contact [facility] for night-coverage after hours (4p -7a ) and weekends on [email or web address]  Agree with above  35 y/o male with previous MV repair in 2019 due to torn chord with subsequent recurrent MR, chronic systolic HF due to biventricular HF with EF 30-35% in 3/22, PAF  Previously saw Dr. Cornelius Moras for potential MVR but failed  to f/u.   Over last month progressive NYHA IIIB-IV HF symptoms. Presented to [facility] ER today with recurrent HF and evidence of low output and AKI.   Rec'd 40IV lasix with mild response.   General:  Sitting up in bed. Mild SOB at times HEENT: normal Neck: supple. JVP to jaw Carotids 2+ bilat; no bruits. No lymphadenopathy or thryomegaly appreciated. Cor: PMI nondisplaced. Regular tachy  3/6 MR Lungs: decreased at bases Abdomen: soft, nontender, nondistended. No hepatosplenomegaly. No bruits or masses. Good bowel sounds. Extremities: no cyanosis, clubbing, rash, 1-2+ edema + cool. Healed sores Neuro: alert & orientedx3, cranial nerves grossly intact. moves all 4 extremities w/o difficulty. Affect pleasant  He has evidence of low output HF with marked volume overload in setting of known severe MR. Will diurese with IV lasix. Place PICC. Will need repeat echo and RHC  and likely TEE. Wil start empiric milrinone. Follow co-ox and CVP.   Once stabilized will need to discuss next steps with [facility].   CRITICAL CARE Performed by: Glori Bickers  Total critical care time: 45 minutes  Critical care time was exclusive of separately billable procedures and treating other patients.  Critical care was necessary to treat or prevent imminent or life-threatening deterioration.  Critical care was time spent personally by me (independent of midlevel providers or residents) on the following activities: development of treatment plan with Donald [name] and/or surrogate as well as nursing, discussions with consultants, evaluation of Donald [name]'s response to treatment, examination of Donald [name], obtaining history from Donald [name] or surrogate, ordering and performing treatments and interventions, ordering and review of laboratory studies, ordering and review of radiographic studies, pulse oximetry and re-evaluation of Donald [name]'s condition.  Glori Bickers, MD  6:53 PM

## 2021-05-30 NOTE — ED Notes (Signed)
Pt having increased SOB. This nurse notified PA of increased SOB, and decreased urine output. PA stated that if pt does not have any output at 1015 then to perform an in and out catheter.

## 2021-05-30 NOTE — Consult Note (Addendum)
Cardiology Consultation:   Patient ID: Donald [name] MRN: [id number]; DOB: 05/05/1986  Admit date: 05/29/2021 Date of Consult: 05/30/2021  PCP:  Patient, No Pcp Per (Inactive)   [facility] Providers Cardiologist:  Dina Rich, MD        Patient Profile:   Donald [name] is a 35 y.o. 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  History of Present Illness:   Donald [name] has been followed closely by Cardiology given his recurrent severe MR by TEE in 05/2020.  He did undergo a R/LHC in 08/2020 by Donald [name] which showed normal coronary arteries but EF was reduced at 30 to 35% and was noted to have severe MR and mild pulmonary venous hypertension with normal CO. He also underwent preoperative dental surgery in 10/2020. He was supposed to follow-up with Advanced Heart Failure in the interim but was not evaluated since. His last visit with Donald [name] was in 03/2021 and he had recently undergone cardioversion in the ED for atrial flutter with RVR and was maintaining normal sinus rhythm at follow-up.  Was referred back to see Donald [name] but he is no longer with [facility].   He had a scheduled visit with Donald [name] on 05/29/2021 but was late to the appointment and it was rescheduled for today. In the interim, he presented to [facility] ED late last night for evaluation of worsening dyspnea and a productive cough. In talking with  the patient today, he reports progressive dyspnea on exertion, orthopnea, PND and lower extremity edema for the past several months. He is unsure of his baseline weight or associated weight gain during this timeframe. This has prompted several ED evaluations during that timeframe. He reports good compliance with his medications and says he has been taking Lasix 100 mg daily along with his other cardiac medications. He does not add salt to food but does consume fast food routinely and reports drinking a significant amount of fluids while at work. He continues to work at [employer] full-time. He denies any recent chest pain or palpitations. No reported alcohol use, tobacco use or recreational drug use.  Initial labs show WBC 10.1, Hgb 14.4, platelets 241, Na+ 134, K+ 3.2 and creatinine 1.71 (baseline 0.9 -1.0). BNP 2863. Initial Hs Troponin 138 with repeat of 126. CXR showing stable cardiomegaly and pulmonary vascular congestion without acute pulmonary edema. EKG shows sinus tachycardia, heart rate 102 with first-degree AV block and biatrial enlargement.  He has only received IV Lasix 20 mg since admission and reports minimal output with this. Scheduled to receive 40 mg twice daily starting today.   Past Medical History:  Diagnosis Date   Anemia    Autoimmune disorder (HCC)    pyoderma gangrenosum   CHF (congestive heart failure) (HCC)    Chronic systolic heart failure (HCC)    a. EF 35-40% by echo in 07/2018 b. EF at 45% by repeat echo in 04/2020   DVT (deep venous thrombosis) (HCC)    h/o  Dysrhythmia    Mitral regurgitation    a. 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 b. 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HCC)    arthritis   Tricuspid regurgitation      Past Surgical History:  Procedure Laterality Date   CARDIAC CATHETERIZATION     HERNIA REPAIR Right 2018   MITRAL VALVE REPAIR  06/07/2018   [facility] - Dr Meda [name]   MULTIPLE EXTRACTIONS WITH ALVEOLOPLASTY N/A 11/08/2020   Procedure: EXTRACTION OF TEETH NUMBER ONE, FIFTHTEEN, SIXTEEN, SEVENTEEN, EIGHTTEEN AND THRTY WITH ALVEOLOPLASTY OF LOWER LEFT QUADRANT.;  Donald [name], DMD;  Location: [facility] OR;  Service: Dentistry;  Laterality: N/A;   RIGHT/LEFT HEART CATH AND CORONARY ANGIOGRAPHY N/A 09/11/2020   Procedure: RIGHT/LEFT HEART CATH AND CORONARY ANGIOGRAPHY;  Surgeon: Donald Patty, MD;  Location: [facility] INVASIVE CV LAB;  Service: Cardiovascular;  Laterality: N/A;   TEE WITHOUT CARDIOVERSION N/A 05/23/2020   Procedure: TRANSESOPHAGEAL ECHOCARDIOGRAM (TEE) WITH PROPOFOL;  Surgeon: Donald Poche, MD;  Location: [facility] ENDO SUITE;  Service: Endoscopy;  Laterality: N/A;     Home Medications:  Prior to Admission medications   Medication Sig Start Date End Date Taking? Authorizing Provider  apixaban (ELIQUIS) 5 MG TABS tablet Take 1 tablet (5 mg total) by mouth 2 (two) times daily. 03/18/21  Yes Donald Pea, MD  ferrous sulfate 325 (65 FE) MG tablet Take 1 tablet (325 mg total) by mouth daily. Patient taking differently: Take 325 mg by mouth in the morning. 12/23/18  Yes Branch, Donald Pea, MD  furosemide (LASIX) 20 MG tablet Take 1 tablet (20 mg total) by mouth daily. With 80 mg tablet 03/18/21  Yes Branch, Donald Pea, MD  furosemide (LASIX) 80 MG tablet Take 1 tablet (80 mg total) by mouth daily. With 20 mg tablet 03/18/21  Yes Branch, Donald Pea, MD  metoprolol succinate (TOPROL-XL) 50 MG 24 hr tablet Take 50 mg by mouth daily. Take with or immediately following a meal.   Yes [provider]  potassium chloride SA (KLOR-CON M) 20 MEQ tablet Take 20 mEq by mouth daily.   Yes [provider]  sacubitril-valsartan (ENTRESTO) 24-26 MG Take 1  tablet by mouth 2 (two) times daily. 08/27/20  Yes Bensimhon, Donald Buckles, MD  spironolactone (ALDACTONE) 25 MG tablet Take 0.5 tablets (12.5 mg total) by mouth daily. 03/18/21  Yes Donald Pea, MD    Inpatient Medications: Scheduled Meds:  apixaban  5 mg Oral BID   feeding supplement (GLUCERNA SHAKE)  237 mL Oral TID BM   ferrous sulfate  325 mg Oral Daily   furosemide  20 mg Intravenous Q12H   potassium chloride SA  20 mEq Oral Daily    Allergies:   No Known Allergies  Social History:   Social History   Tobacco Use   Smoking status: Some Days    Types: Cigarettes   Smokeless tobacco: Never  Substance Use Topics   Alcohol use: No     Family History:    Family History  Problem Relation Age of Onset   Multiple sclerosis Mother    Psoriasis Mother    Depression Father    Diabetes Father    Diabetes Paternal Grandmother      ROS:  Please see the history of present illness.   All other ROS reviewed and negative.     Physical Exam/Data:   Vitals:   05/30/21 0015 05/30/21 0230 05/30/21 0640 05/30/21 0641  BP: 92/64 119/88 (!) 94/47 (!) 110/92  Pulse: 95 95 95 94  Resp: (!) 23 19 20  (!) 22  Temp:   (!) 97.1 F (36.2 C)   TempSrc:   Oral   SpO2: 96% 100% 96% 100%  Weight:      Height:        Intake/Output Summary (Last 24 hours) at 05/30/2021 0739 Last data filed at 05/30/2021 0721 Gross per 24 hour  Intake --  Output 300 ml  Net -300 ml   Last 3 Weights 05/29/2021 05/09/2021 03/18/2021  Weight (lbs) 211 lb 229 lb 1.6 oz 227 lb  Weight (kg) 95.709 kg 103.919 kg 102.967 kg     Body mass index is 31.16 kg/m.  General: Pleasant male but appears anxious. HEENT: normal Neck: JVD elevated.  Vascular: No carotid bruits; Distal pulses 2+ bilaterally Cardiac:  normal S1, S2; Regular rhythm, tachycardiac rate. 3/6 systolic murmur along Apex.  Lungs: Rales along bases bilaterally. Abd: soft, nontender, no hepatomegaly  Ext: 2+ pitting edema  bilaterally. Musculoskeletal:  No deformities, BUE and BLE strength normal and equal Skin: warm and dry. Pustules along forearms.  Neuro:  CNs 2-12 intact, no focal abnormalities noted Psych:  Normal affect   EKG:  The EKG was personally reviewed and demonstrates: Sinus tachycardia, heart rate 102 with first-degree AV block and biatrial enlargement.  Telemetry:  Telemetry was personally reviewed and demonstrates: Normal sinus rhythm, heart rate in the 90's.  Occasional episodes of ventricular bigeminy.  Relevant CV Studies:  TEE: 05/2020 IMPRESSIONS    1. Left ventricular ejection fraction, by estimation, is 40 to 45%. The  left ventricle has mildly decreased function.   2. Right ventricular systolic function moderately to severely reduced.  The right ventricular size is moderately enlarged.   3. Left atrial size was severely dilated. No left atrial/left atrial  appendage thrombus was detected. The LAA emptying velocity was 60 cm/s.   4. Right atrial size was severely dilated.   5. Difficult visualization of mitral valve and regurgitant jet. Findings  consistent with prior repair. Thickened leaflets with restricted motion.  The MR jet was not amenable to PISA. Moderate mean gradient across the  valve of 6 mmHg. In images #[id number]  there appears to be regurgitation jet through the valve but also lateral  to the anular ring. The cumulative regurgitation appears to be severe. .  The mitral valve has been repaired/replaced. Severe mitral valve  regurgitation. Moderate mitral stenosis.   6. The tricuspid valve is abnormal. Tricuspid valve regurgitation is  moderate to severe.   7. The aortic valve is tricuspid. Aortic valve regurgitation is not  visualized. No aortic stenosis is present.   8. Technically difficult study, standard views were off axis particularly  of the mitral valve.   R/LHC: 08/2020 Findings:   RA = 7 RV = 49/10 PA = 48/16 (32) PCW = 17 (v=32) Fick cardiac  output/index = 5.3/2.5 PVR = 2.9 WU Ao sat = 99% PA sat = 71%, 70% High SVC sat =  75%   Assessment:   1. Normal coronary arteries 2. NICM EF 30-35% 3. Severe MR with prominent v-waves in PCWP tracing 4. Mild pulmonary venous HTN with normal CO   Plan/Discussion:   F/u with Dr. 09/2020 for possible MV  replacement.   Laboratory Data:  High Sensitivity Troponin:   Recent Labs  Lab 05/23/21 1727 05/23/21 1856 05/29/21 2214 05/30/21 0010  TROPONINIHS 66* 64* 138* 126*     Chemistry Recent Labs  Lab 05/23/21 1727 05/29/21 2214 05/30/21 0610  NA 137 134* 133*  K 3.4* 3.2* 3.8  CL 101 98 100  CO2 28 22 20*  GLUCOSE 102* 158* 130*  BUN 13 26* 28*  CREATININE 0.97 1.71* 1.64*  CALCIUM 8.7* 8.9 8.7*  MG  --   --  1.6*  GFRNONAA >60 54* 56*  ANIONGAP 8 14 13     Recent Labs  Lab 05/23/21 1727 05/29/21 2214 05/30/21 0610  PROT 6.8 8.0 7.3  ALBUMIN 3.3* 3.4* 3.1*  AST 86* 56* 49*  ALT 47* 30 27  ALKPHOS 88 104 91  BILITOT 1.4* 1.4* 1.0    Hematology Recent Labs  Lab 05/23/21 1727 05/29/21 2214 05/30/21 0610  WBC 10.0 10.1 9.3  RBC 4.85 4.90 4.88  HGB 13.8 14.4 14.0  HCT 44.2 43.5 42.8  MCV 91.1 88.8 87.7  MCH 28.5 29.4 28.7  MCHC 31.2 33.1 32.7  RDW 16.3* 15.5 15.3  PLT 201 241 222    BNP Recent Labs  Lab 05/23/21 1727 05/29/21 2214  BNP 766.0* 2,863.0*     Radiology/Studies:  DG Chest 2 View  Result Date: 05/29/2021 CLINICAL DATA:  Shortness of breath with history of CHF. EXAM: CHEST - 2 VIEW COMPARISON:  Recent PA and lateral chest 05/23/2021. FINDINGS: Moderate to severe cardiomegaly is again noted with old sternotomy sutures and prosthetic aortic valve. Central vascular distension and flow cephalization continue to be seen, without overt edema. The lungs are clear of infiltrates.  No pleural effusion is seen. IMPRESSION: Stable cardiomegaly and pulmonary vascular congestion without evidence of acute edema. Electronically Signed   By: 14/01/2021 M.D.   On: 05/29/2021 22:42     Assessment and Plan:   1. Acute HFrEF/RV Dysfunction - He has a known cardiomyopathy with a EF of 40 to 45% by TEE in 05/2020 and 30-35% by cath in 08/2020. He presents with progressive dyspnea on exertion, orthopnea, PND and edema for the past several months and is unsure of his exact associated weight gain. - BNP elevated to 863 and CXR consistent with CHF.  Repeat echo pending. He has only received 20 mg of IV Lasix thus far and is significantly volume overloaded on examination today. He is scheduled to start 40 mg twice daily and I have asked his nurse to give this as soon as possible. His BP has been labile and SBP is currently in the low 100's. Pending his response, he may require inotropic support for diuresis. Discussed with Dr. 09/2020 and will arrange for transfer to [facility] on the Cardiology service.  - He was on Toprol-XL 50 mg daily, Entresto 24-26 mg twice daily and Spironolactone 12.5 mg daily as an outpatient. Will hold Entresto for now given his soft BP and enter hold parameters for his Toprol-XL and Aldactone.  2. Mitral Regurgitation - He underwent MV repair with resection of ruptured anterior papillary muscle and reconstruction of papillary chord and placement of annuloplasty ring in 2019. TEE in 07/2018 showed a mobile echodensity on the posterior leaflet and possibly surgical suture/torn chordae/vegetation and TEE in 05/2020 showed regurgitation jet through the valve but also lateral to the anular ring. Was previously being followed by Dr. 06/2020 for valve repair but lost to follow-up. Will need to be  evaluated by CT Surgery again but likely transferred to [facility] (where he had his surgery in 2019) or [facility]. He does have papules on exam but denies IVDU. Likely due to known pyoderma gangrenosum. Will check UDS.   3. Paroxysmal Atrial Flutter - He underwent DCCV in 02/2021 and denies any recurrent palpitations. In NSR currently. On  Toprol-XL 50mg  daily as an outpatient and currently ordered with hold parameters in place given his variable BP.  - No reports of active bleeding. He has been continued on Eliquis 5mg  BID for anticoagulation.   4. Autoimmune disorder - He has known seronegative spondylitis and pyoderma gangrenosum. Previously followed [facility]ology at [facility].   5. AKI - Baseline creatinine 0.9 - 1.0. Elevated to 1.71 on admission and improving with diuresis. At 1.64 this AM. Continue to trend.     Risk Assessment/Risk Scores:  {   New York Heart Association (NYHA) Functional Class NYHA Class III   For questions or updates, please contact [facility] Please consult [email or web address] for contact info under    Signed, Ellsworth Lennox, PA-C  05/30/2021 7:39 AM    Attending note:  Patient seen and examined.  I reviewed his records and discussed the case with Ms. Patrick Jupiter, I agree with her above findings.  Mr. Stipes has a history of mitral valve repair/annuloplasty back in 2019 as discussed above, more recently documented HFrEF and RV dysfunction in the setting of progressive mitral valve disease and severe mitral regurgitation.  He has undergone work-up over the last year including TEE in December 2021, right and left heart catheterization in March of this year, subsequent dental surgery with plan to have surgical evaluation by Donald [name].  Patient was delayed in his presentation for this however.  He had a visit to see Donald [name] today, came to the [facility] ER with worsening shortness of breath, leg swelling, orthopnea and PND.  This has been getting worse over the last few months.  He states that he has been taking his medications regularly.  Denies any recreational drug use.  On examination he is short of breath at rest.  Afebrile, heart rate in the 90-100 range in sinus rhythm by telemetry which I personally reviewed.  Systolics 100-115.  Increased JVP, lungs are without crackles or  wheezing.  Cardiac exam reveals RRR with 3/6 holosystolic murmur consistent with mitral regurgitation, somewhat prominent P2.  Abdomen protuberant, 2+ peripheral edema noted.  Pertinent lab work includes sodium 133, potassium 3.8, BUN 28, creatinine 1.64, BNP 2863, high-sensitivity troponin I levels recently 130 826, hemoglobin 14.0, platelets 222, influenza A and COVID-19 negative.  Chest x-ray shows stable cardiomegaly and pulmonary vascular congestion.  ECG shows sinus tachycardia with left atrial enlargement and inferolateral Q waves.  HFrEF with acute on chronic systolic heart failure, RV dysfunction, and concomitant severe mitral regurgitation by most recent work-up.  Normal coronaries as of March of this year.  Cardiac output was normal earlier in the year, although concerned about progression since that time now with cardiogenic shock and fluid overload.  For now holding Toprol-XL and Entresto, will treat with IV Lasix, has not had a lot of urine output yet in the ER however and may ultimately need inotropic/mechanical support.  Plan is to get him transferred to the ICU at [facility] for evaluation by the advanced heart failure team.  He will need a follow-up echocardiogram and further stabilization, anticipate ultimately transfer to [facility] for valve operation.  Jonelle Sidle,  M.D., F.A.C.C.

## 2021-05-30 NOTE — ED Notes (Signed)
Called [facility] to transport pt

## 2021-05-30 NOTE — ED Notes (Signed)
Pt is c/o increased SOB. Pt is tachypneic and O2 is 98%. Hospitalist notified.

## 2021-05-30 NOTE — H&P (Signed)
History and Physical  Donald [name] OFB:[id number] DOB: [birth year] DOA: 05/29/2021  Referring physician: Benjiman Core, MD  PCP: Donald [name], No Pcp Per (Inactive)  Donald [name] coming from: Home  Chief Complaint: Shortness of breath   HPI: Donald [name] is an obese 35 y.o. male with medical history significant for Chronic HFrEF (LVEF of 45% on echo done in 04/2020), paroxysmal Atrial Flutter on Eliquis who presents to the emergency department due to shortness of breath which started when Donald [name] woke up yesterday (2/14) in the morning.  He endorsed shortness of breath at rest and on exertion, this was associated with nonbloody vomiting (about 4-5 episodes since onset this morning). Donald [name] was seen in the ED on 2/8 due to similar presentation, he was diuresed in the ED and discharged home to follow-up with Dr. Wyline Mood ( cardiologist) as an outpatient, Donald [name]'s appointment with Dr. Wyline Mood was supposed to be today (12/15), but Donald [name] states that the shortness of breath was unbearable, so he activated EMS and was taken to the ED for further evaluation and management.  He also complained of increased leg swelling and increased weight with occasional nonproductive cough.  He denies chest pain, fever, chills, abdominal pain.  Donald [name] denies tobacco/alcohol/recreational drug use.  ED Course:  In the emergency department, he was hemodynamically stable, O2 sat was 89% on room air, this improved to 96-100% on supplemental oxygen via [place] at 2 LPM.  Work-up in the ED showed normal CBC, hypokalemia, BUN/creatinine 26/1.71 (baseline creatinine at 0.8-1.1) and hyperglycemia.  BNP 2,863.  Troponin x2-138 > 126, albumin 3.4, AST 56.  Influenza A, B, SARS coronavirus 2 was negative. Chest x-ray showed stable cardiomegaly and pulmonary vascular congestion without evidence of acute edema. Hospitalist was asked to admit Donald [name] for further evaluation and management.  Review of Systems: Constitutional:  Negative for chills and fever.  HENT: Negative for ear pain and sore throat.   Eyes: Negative for pain and visual disturbance.  Respiratory: Positive for cough and shortness of breath.   Cardiovascular: Positive for leg swelling.  Negative for chest pain and palpitations.  Gastrointestinal: Positive for vomiting.  Negative for abdominal pain  Endocrine: Negative for polyphagia and polyuria.  Genitourinary: Negative for decreased urine volume, dysuria, enuresis Musculoskeletal: Negative for arthralgias and back pain.  Skin: Negative for color change and rash.  Allergic/Immunologic: Negative for immunocompromised state.  Neurological: Negative for tremors, syncope, speech difficulty, weakness, light-headedness and headaches.  Hematological: Does not bruise/bleed easily.  All other systems reviewed and are negative   Past Medical History:  Diagnosis Date   Anemia    Autoimmune disorder (HCC)    pyoderma gangrenosum   CHF (congestive heart failure) (HCC)    Chronic systolic heart failure (HCC)    a. EF 35-40% by echo in 07/2018 b. EF at 45% by repeat echo in 04/2020   DVT (deep venous thrombosis) (HCC)    h/o   Dysrhythmia    Mitral regurgitation    a. 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 b. 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HCC)    arthritis   Tricuspid regurgitation    Past Surgical History:  Procedure Laterality Date   CARDIAC CATHETERIZATION     HERNIA REPAIR Right 2018   MITRAL VALVE REPAIR  06/07/2018  [facility] Dr Meda Klinefelter   MULTIPLE EXTRACTIONS WITH ALVEOLOPLASTY N/A 11/08/2020   Procedure: EXTRACTION OF TEETH NUMBER ONE, FIFTHTEEN, SIXTEEN, SEVENTEEN, EIGHTTEEN AND THRTY WITH ALVEOLOPLASTY OF LOWER LEFT QUADRANT.;   Surgeon: Sharman Cheek, DMD;  Location: [facility] OR;  Service: Dentistry;  Laterality: N/A;   RIGHT/LEFT HEART CATH AND CORONARY ANGIOGRAPHY N/A 09/11/2020   Procedure: RIGHT/LEFT HEART CATH AND CORONARY ANGIOGRAPHY;  Surgeon: Dolores Patty, MD;  Location: [facility] INVASIVE CV LAB;  Service: Cardiovascular;  Laterality: N/A;   TEE WITHOUT CARDIOVERSION N/A 05/23/2020   Procedure: TRANSESOPHAGEAL ECHOCARDIOGRAM (TEE) WITH PROPOFOL;  Surgeon: [name], MD;  Location: [facility] ENDO SUITE;  Service: Endoscopy;  Laterality: N/A;    Social History:  reports that he has been smoking cigarettes. He has never used smokeless tobacco. He reports that he does not drink alcohol and does not use drugs.   No Known Allergies  Family History  Problem Relation Age of Onset   Multiple sclerosis Mother    Psoriasis Mother    Depression Father    Diabetes Father    Diabetes Paternal Grandmother     Prior to Admission medications   Medication Sig Start Date End Date Taking? Authorizing Provider  amiodarone (PACERONE) 200 MG tablet Take 200 mg by mouth daily. Donald [name] not taking: Reported on 05/23/2021    [provider]  apixaban (ELIQUIS) 5 MG TABS tablet Take 1 tablet (5 mg total) by mouth 2 (two) times daily. 03/18/21   [name], MD  ferrous sulfate 325 (65 FE) MG tablet Take 1 tablet (325 mg total) by mouth daily. Donald [name] taking differently: Take 325 mg by mouth in the morning. 12/23/18   [name], MD  furosemide (LASIX) 20 MG tablet Take 1 tablet (20 mg total) by mouth daily. With 80 mg tablet 03/18/21   [name], MD  furosemide (LASIX) 40 MG tablet Take 1 tablet by mouth daily. 03/13/21   [provider]  furosemide (LASIX) 80 MG tablet Take 1 tablet (80 mg total) by mouth daily. With 20 mg tablet 03/18/21   [name], MD  metoprolol succinate (TOPROL-XL) 50 MG 24 hr tablet Take 50 mg by mouth daily. Take with or immediately following a meal.     [provider]  potassium chloride SA (KLOR-CON M) 20 MEQ tablet Take 20 mEq by mouth daily.    [provider]  sacubitril-valsartan (ENTRESTO) 24-26 MG Take 1 tablet by mouth 2 (two) times daily. 08/27/20   Bensimhon, Bevelyn Buckles, MD  spironolactone (ALDACTONE) 25 MG tablet Take 0.5 tablets (12.5 mg total) by mouth daily. 03/18/21   [name], MD    Physical Exam: BP 92/64    Pulse 95    Temp 97.6 F (36.4 C) (Oral)    Resp (!) 23    Ht 5\' 9"  (1.753 m)    Wt 95.7 kg    SpO2 96%    BMI 31.16 kg/m   General: 35 y.o. year-old male well developed well nourished in no acute distress.  Alert and oriented x3. HEENT: NCAT, EOMI Neck: Supple, trachea medial Cardiovascular: Regular rate and rhythm with no rubs or gallops.  No thyromegaly or JVD noted.  +2 lower extremity edema bilaterally to the knees. 2/4 pulses in all 4 extremities. Respiratory: Bilateral rales in lower lobes.  No wheezes.   Abdomen: Soft, nontender nondistended with normal bowel sounds x4 quadrants. Muskuloskeletal: No cyanosis or clubbing noted bilaterally Neuro: CN  II-XII intact, strength 5/5 x 4, sensation, reflexes intact Skin: No ulcerative lesions noted or rashes Psychiatry: Judgement and insight appear normal. Mood is appropriate for condition and setting          Labs on Admission:  Basic Metabolic Panel: Recent Labs  Lab 05/23/21 1727 05/29/21 2214  NA 137 134*  K 3.4* 3.2*  CL 101 98  CO2 28 22  GLUCOSE 102* 158*  BUN 13 26*  CREATININE 0.97 1.71*  CALCIUM 8.7* 8.9   Liver Function Tests: Recent Labs  Lab 05/23/21 1727 05/29/21 2214  AST 86* 56*  ALT 47* 30  ALKPHOS 88 104  BILITOT 1.4* 1.4*  PROT 6.8 8.0  ALBUMIN 3.3* 3.4*   No results for input(s): LIPASE, AMYLASE in the last 168 hours. No results for input(s): AMMONIA in the last 168 hours. CBC: Recent Labs  Lab 05/23/21 1727 05/29/21 2214  WBC 10.0 10.1  NEUTROABS 6.3 6.5  HGB 13.8 14.4  HCT 44.2 43.5  MCV  91.1 88.8  PLT 201 241   Cardiac Enzymes: No results for input(s): CKTOTAL, CKMB, CKMBINDEX, TROPONINI in the last 168 hours.  BNP (last 3 results) Recent Labs    05/09/21 1945 05/23/21 1727 05/29/21 2214  BNP 1,301.0* 766.0* 2,863.0*    ProBNP (last 3 results) No results for input(s): PROBNP in the last 8760 hours.  CBG: No results for input(s): GLUCAP in the last 168 hours.  Radiological Exams on Admission: DG Chest 2 View  Result Date: 05/29/2021 CLINICAL DATA:  Shortness of breath with history of CHF. EXAM: CHEST - 2 VIEW COMPARISON:  Recent PA and lateral chest 05/23/2021. FINDINGS: Moderate to severe cardiomegaly is again noted with old sternotomy sutures and prosthetic aortic valve. Central vascular distension and flow cephalization continue to be seen, without overt edema. The lungs are clear of infiltrates.  No pleural effusion is seen. IMPRESSION: Stable cardiomegaly and pulmonary vascular congestion without evidence of acute edema. Electronically Signed   By: Almira Bar M.D.   On: 05/29/2021 22:42    EKG: I independently viewed the EKG done and my findings are as followed: Sinus tachycardia at a rate of 102 bpm with prolonged QTc (511 ms)  Assessment/Plan Present on Admission: **None**  Principal Problem:   Acute exacerbation of CHF (congestive heart failure) (HCC) Active Problems:   Acute respiratory failure with hypoxia (HCC)   Hypokalemia   Elevated brain natriuretic peptide (BNP) level   Elevated troponin   AKI (acute kidney injury) (HCC)   Hypoalbuminemia due to protein-calorie malnutrition (HCC)   Prolonged QT interval   Elevated AST (SGOT)  Acute respiratory failure with hypoxia secondary to acute exacerbation of CHF (HFrEF) Elevated BNP (2,863) Chest x-ray showed stable cardiomegaly and pulmonary vascular congestion without evidence of acute edema. Continue total input/output, daily weights and fluid restriction Continue IV Lasix 20 mg twice  daily with holding parameters Continue Cardiac diet  EKG personally reviewed showed sinus tachycardia at a rate of 102 bpm with prolonged QTc (511 ms) Echocardiogram done on 05/23/2020 showed LVEF of 40 to 45%.  LV has mildly decreased function.  RV systolic function moderately to severely reduced.  LV size was severely dilated.  Echocardiogram will be done in the morning  Cardiology will be consulted and we shall await further recommendation  Hypokalemia K+ is 3.2 K+ will be replenished Please monitor for AM K+ for further replenishmemnt  Elevated troponin possibly secondary to type II demand ischemia Troponin 138 > 126.  Donald [name] denies  chest pain  Acute kidney injury BUN/creatinine 26/1.71 (baseline creatinine at 0.8-1.1)  Renally adjust medications, avoid nephrotoxic agents/dehydration/hypotension  Prolonged QTc (511 ms) Avoid QT prolonging drugs K+ was 3.2, this was replenished Magnesium level will be checked Repeat EKG in the morning  Elevated AST AST 56; continue to monitor liver enzymes  Hypoalbuminemia possibly secondary to mild protein calorie malnutrition Albumin 3.4, protein supplement will be provided  Hyperglycemia possibly secondary to a reactive process CBG 158, continue to monitor blood glucose levels with morning labs  Paroxysmal atrial flutter Donald [name] is currently in sinus tachycardia Donald [name] does not take amiodarone any longer per med rec Continue Eliquis    DVT prophylaxis: Eliquis  Code Status: Full code  Family Communication: None at bedside  Disposition Plan:  Donald [name] is from:                        home Anticipated DC to:                   SNF or family members home Anticipated DC date:               2-3 days Anticipated DC barriers:         Donald [name] requires inpatient management due to worsening CHF requiring further work-up and pending cardiology consult  Consults called: Cardiology  Admission status: Inpatient    Frankey Shown  MD [facility]  05/30/2021, 2:30 AM

## 2021-05-30 NOTE — Progress Notes (Signed)
Patient is being transferred to [facility] under cardiology service.  Please see H&P from Dr. Thomes Dinning.   Marlin Canary DO

## 2021-05-31 ENCOUNTER — Inpatient Hospital Stay (HOSPITAL_COMMUNITY): Payer: Medicaid Other

## 2021-05-31 DIAGNOSIS — I340 Nonrheumatic mitral (valve) insufficiency: Secondary | ICD-10-CM

## 2021-05-31 LAB — BASIC METABOLIC PANEL
Anion gap: 11 (ref 5–15)
BUN: 35 mg/dL — ABNORMAL HIGH (ref 6–20)
CO2: 22 mmol/L (ref 22–32)
Calcium: 8.7 mg/dL — ABNORMAL LOW (ref 8.9–10.3)
Chloride: 100 mmol/L (ref 98–111)
Creatinine, Ser: 1.79 mg/dL — ABNORMAL HIGH (ref 0.61–1.24)
GFR, Estimated: 51 mL/min — ABNORMAL LOW (ref >60)
Glucose, Bld: 115 mg/dL — ABNORMAL HIGH (ref 70–99)
Potassium: 3.9 mmol/L (ref 3.5–5.1)
Sodium: 133 mmol/L — ABNORMAL LOW (ref 135–145)

## 2021-05-31 LAB — APTT
aPTT: 70 seconds — ABNORMAL HIGH (ref 24–36)
aPTT: >200 seconds (ref 24–36)
aPTT: 65 seconds — ABNORMAL HIGH (ref 24–36)

## 2021-05-31 LAB — ECHOCARDIOGRAM COMPLETE
AR max vel: 2.69 cm2
AV Area VTI: 2.35 cm2
AV Area mean vel: 2.46 cm2
AV Mean grad: 4 mmHg
AV Peak grad: 7.3 mmHg
Ao pk vel: 1.35 m/s
Calc EF: 25 %
Height: 69 in
MV VTI: 0.78 cm2
S' Lateral: 5.4 cm
Single Plane A2C EF: 24.5 %
Single Plane A4C EF: 22.3 %
Weight: 3559.11 oz

## 2021-05-31 LAB — COOXEMETRY PANEL
Carboxyhemoglobin: 1.3 % (ref 0.5–1.5)
Carboxyhemoglobin: 1.1 % (ref 0.5–1.5)
Methemoglobin: 0.6 % (ref 0.0–1.5)
Methemoglobin: 0.7 % (ref 0.0–1.5)
O2 Saturation: 97.1 %
O2 Saturation: 51 %
Total hemoglobin: 13.2 g/dL (ref 12.0–16.0)
Total hemoglobin: 13.8 g/dL (ref 12.0–16.0)

## 2021-05-31 LAB — CBC
HCT: 38.3 % — ABNORMAL LOW (ref 39.0–52.0)
Hemoglobin: 13.2 g/dL (ref 13.0–17.0)
MCH: 29.1 pg (ref 26.0–34.0)
MCHC: 34.5 g/dL (ref 30.0–36.0)
MCV: 84.5 fL (ref 80.0–100.0)
Platelets: 233 10*3/uL (ref 150–400)
RBC: 4.53 MIL/uL (ref 4.22–5.81)
RDW: 14.7 % (ref 11.5–15.5)
WBC: 9.6 10*3/uL (ref 4.0–10.5)
nRBC: 0 % (ref 0.0–0.2)

## 2021-05-31 LAB — HEPARIN LEVEL (UNFRACTIONATED): Heparin Unfractionated: 0.92 IU/mL — ABNORMAL HIGH (ref 0.30–0.70)

## 2021-05-31 LAB — LACTIC ACID, PLASMA: Lactic Acid, Venous: 1.7 mmol/L (ref 0.5–1.9)

## 2021-05-31 MED ORDER — SODIUM CHLORIDE 0.9% FLUSH
10.0000 mL | Freq: Two times a day (BID) | INTRAVENOUS | Status: DC
  Administered 2021-05-31 – 2021-06-03 (×8): 10 mL
  Administered 2021-06-04: 09:00:00 40 mL

## 2021-05-31 MED ORDER — ADULT MULTIVITAMIN W/MINERALS CH
1.0000 | ORAL_TABLET | Freq: Every day | ORAL | Status: DC
  Administered 2021-05-31 – 2021-06-04 (×5): 1 via ORAL
  Filled 2021-05-31 (×5): qty 1

## 2021-05-31 MED ORDER — ENSURE ENLIVE PO LIQD
237.0000 mL | Freq: Two times a day (BID) | ORAL | Status: DC
  Administered 2021-05-31 – 2021-06-04 (×7): 237 mL via ORAL

## 2021-05-31 MED ORDER — CHLORHEXIDINE GLUCONATE CLOTH 2 % EX PADS
6.0000 | MEDICATED_PAD | Freq: Every day | CUTANEOUS | Status: DC
  Administered 2021-05-31 – 2021-06-04 (×5): 6 via TOPICAL

## 2021-05-31 MED ORDER — SODIUM CHLORIDE 0.9% FLUSH: 10.0000 mL | INTRAVENOUS | Status: DC | PRN

## 2021-05-31 NOTE — Progress Notes (Signed)
Peripherally Inserted Central Catheter Placement  The IV Nurse has discussed with the patient and/or persons authorized to consent for the patient, the purpose of this procedure and the potential benefits and risks involved with this procedure.  The benefits include less needle sticks, lab draws from the catheter, and the patient may be discharged home with the catheter. Risks include, but not limited to, infection, bleeding, blood clot (thrombus formation), and puncture of an artery; nerve damage and irregular heartbeat and possibility to perform a PICC exchange if needed/ordered by physician.  Alternatives to this procedure were also discussed.  Bard Power PICC patient education guide, fact sheet on infection prevention and patient information card has been provided to patient /or left at bedside.    PICC Placement Documentation  PICC Double Lumen 05/31/21 PICC Left Basilic 46 cm (Active)  Indication for Insertion or Continuance of Line Vasoactive infusions 05/31/21 0900  Site Assessment Clean;Dry;Intact 05/31/21 0900  Lumen #1 Status Flushed;Blood return noted 05/31/21 0900  Lumen #2 Status Flushed;Blood return noted 05/31/21 0900  Dressing Type Transparent 05/31/21 0900  Dressing Status Clean;Dry;Intact 05/31/21 0900  Antimicrobial disc in place? Yes 05/31/21 0900  Dressing Change Due 06/07/21 05/31/21 0900       Stacie Glaze Horton 05/31/2021, 9:21 AM

## 2021-05-31 NOTE — Progress Notes (Signed)
ANTICOAGULATION CONSULT NOTE  Pharmacy Consult for heparin Indication: atrial fibrillation  Heparin Dosing Weight: 92 kg  Labs: Recent Labs    05/30/21 0610 05/30/21 1846 05/31/21 0337 05/31/21 1324 05/31/21 1511  HGB 14.0 14.4 13.2  --   --   HCT 42.8 43.5 38.3*  --   --   PLT 222 256 233  --   --   APTT 30 36 70* >200* 65*  HEPARINUNFRC  --   --  0.92*  --   --   CREATININE 1.64* 2.07* 1.79*  --   --      Assessment: 35 yom on apixaban PTA for hx afib (LD 12/15@0918 ) presenting with acute on chronic biventricular heart failure, cardiogenic shock. Pharmacy consulted to transition apixaban to heparin. Heart failure service planning to discuss plans with [facility] for surgery options.   Repeat aPTT drawn peripherally is just below goal at 65 seconds on 1400 units/h.  Goal of Therapy:  Heparin level 0.3-0.7 units/ml aPTT 66-102 seconds Monitor platelets by anticoagulation protocol: Yes   Plan:  Increase heparin slightly to 1450 units/h Recheck aPTT with morning labs  Fredonia Highland, PharmD, BCPS, [facility] Clinical Pharmacist [phone] Please check AMION for all [facility] Pharmacy numbers 05/31/2021

## 2021-05-31 NOTE — Plan of Care (Signed)

## 2021-05-31 NOTE — Progress Notes (Signed)
Heart Failure Navigator Progress Note  Assessed for Heart & Vascular TOC clinic readiness.  Patient does not meet criteria due to AHF rounding team is primary.   Navigator available for educational resources.  Ozella Rocks, MSN, RN Heart Failure Nurse Navigator [phone]

## 2021-05-31 NOTE — Progress Notes (Signed)
ANTICOAGULATION CONSULT NOTE  Pharmacy Consult for heparin Indication: atrial fibrillation  Heparin Dosing Weight: 92 kg  Labs: Recent Labs    05/30/21 0610 05/30/21 1846 05/31/21 0337  HGB 14.0 14.4 13.2  HCT 42.8 43.5 38.3*  PLT 222 256 233  APTT 30 36 70*  HEPARINUNFRC  --   --  0.92*  CREATININE 1.64* 2.07* 1.79*     Assessment: 35 yom on apixaban PTA for hx afib (LD 12/15@0918 ) presenting with acute on chronic biventricular heart failure, cardiogenic shock. Pharmacy consulted to transition apixaban to heparin. Heart failure service planning to discuss plans with [facility] for surgery options.   aPTT came back therapeutic at 70 (heparin level still falsely elevated at 0.92), on 1400 units/hr. Hgb 13.,2 plt 233. No infusion issues - having bleeding at ulcers on her behind (will monitor).   Will monitor using aPTT while apixaban expected to influence heparin levels.  Goal of Therapy:  Heparin level 0.3-0.7 units/ml aPTT 66-102 seconds Monitor platelets by anticoagulation protocol: Yes   Plan:  Continue heparin at 1400 units/hr  Check 6hr aPTT from last level Monitor daily heparin level and aPTT until correlating, CBC, s/sx bleeding F/u Cardiology plans  Sherron Monday, PharmD, BCCCP Clinical Pharmacist  Phone: [phone] 05/31/2021 1:01 PM  Please check AMION for all [facility] Pharmacy phone numbers After 10:00 PM, call Main Pharmacy [phone]

## 2021-05-31 NOTE — Progress Notes (Signed)
°  Echocardiogram 2D Echocardiogram has been performed.  Roosvelt Maser F 05/31/2021, 4:46 PM

## 2021-05-31 NOTE — Progress Notes (Addendum)
Advanced Heart Failure Rounding Note  PCP-Cardiologist: Dina Rich, MD  [facility]: Dr. 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 Previously saw Dr. Cornelius Moras for potential MVR but failed to f/u. Over last month progressive NYHA IIIB-IV HF symptoms. Presented to [facility] ER with recurrent HF and evidence of low output and AKI. Transferred to [facility] for further management. Started on empiric milrinone.   Subjective:    Started on empiric Milrinone 0.25. Still awaiting PICC line.    UOP picking up, 2.3L out overnight. SCr improving, 2.087>>1.79. Currently sinus tach low 100s. No Afib.   Lactic acid 3.5>>2.5   Feeling better. Breathing improved. No chest pain.    Objective:   Weight Range: 100.9 kg Body mass index is 32.85 kg/m.   Vital Signs:   Temp:  [98.2 F (36.8 C)-98.8 F (37.1 C)] 98.8 F (37.1 C) (12/16 0400) Pulse Rate:  [42-106] 106 (12/16 0700) Resp:  [11-54] 18 (12/16 0700) BP: (83-129)/(43-106) 112/84 (12/16 0700) SpO2:  [89 %-100 %] 96 % (12/16 0700) Weight:  [100.4 kg-100.9 kg] 100.9 kg (12/16 0600) Last BM Date: 05/29/21  Weight change: Filed Weights   05/29/21 2155 05/30/21 1800 05/31/21 0600  Weight: 95.7 kg 100.4 kg 100.9 kg    Intake/Output:   Intake/Output Summary (Last 24 hours) at 05/31/2021 0711 Last data filed at 05/31/2021 0700 Gross per 24 hour  Intake 1581.66 ml  Output 2325 ml  Net -743.34 ml      Physical Exam    General:  chronically ill appearing, young. No resp difficulty HEENT: Normal Neck: Supple. JVP elevated to ear . Carotids 2+ bilat; no bruits. No lymphadenopathy or thyromegaly appreciated. Cor: PMI nondisplaced. Regular rhythm and tachy rate. 3/6 MR murmur loudest at the axillary  Lungs: decreased BS at the bases bilaterally  Abdomen: Soft, nontender, nondistended. No  hepatosplenomegaly. No bruits or masses. Good bowel sounds. Extremities: No cyanosis, clubbing, rash, 1+ bilateral LE edema, cool distal extremities, multiple healed sores bilateral upper and lower extremities  Neuro: Alert & orientedx3, cranial nerves grossly intact. moves all 4 extremities w/o difficulty. Affect pleasant   Telemetry   Sinus tach, low 100s   EKG    No new EKG to review   Labs    CBC Recent Labs    05/29/21 2214 05/30/21 0610 05/30/21 1846 05/31/21 0337  WBC 10.1   < > 11.0* 9.6  NEUTROABS 6.5  --   --   --   HGB 14.4   < > 14.4 13.2  HCT 43.5   < > 43.5 38.3*  MCV 88.8   < > 85.3 84.5  PLT 241   < > 256 233   < > = values in this interval not displayed.   Basic Metabolic Panel Recent Labs    40/98/11 0610 05/30/21 1846 05/31/21 0337  NA 133* 132* 133*  K 3.8 4.7 3.9  CL 100 100 100  CO2 20* 18* 22  GLUCOSE 130* 138* 115*  BUN 28* 32* 35*  CREATININE 1.64* 2.07* 1.79*  CALCIUM 8.7* 9.1 8.7*  MG 1.6*  --   --   PHOS 4.2  --   --    Liver Function Tests Recent Labs    05/30/21 0610 05/30/21 1846  AST 49* 56*  ALT 27 31  ALKPHOS 91 99  BILITOT 1.0  1.8*  PROT 7.3 7.4  ALBUMIN 3.1* 3.0*   No results for input(s): LIPASE, AMYLASE in the last 72 hours. Cardiac Enzymes No results for input(s): CKTOTAL, CKMB, CKMBINDEX, TROPONINI in the last 72 hours.  BNP: BNP (last 3 results) Recent Labs    05/09/21 1945 05/23/21 1727 05/29/21 2214  BNP 1,301.0* 766.0* 2,863.0*    ProBNP (last 3 results) No results for input(s): PROBNP in the last 8760 hours.   D-Dimer No results for input(s): DDIMER in the last 72 hours. Hemoglobin A1C No results for input(s): HGBA1C in the last 72 hours. Fasting Lipid Panel No results for input(s): CHOL, HDL, LDLCALC, TRIG, CHOLHDL, LDLDIRECT in the last 72 hours. Thyroid Function Tests No results for input(s): TSH, T4TOTAL, T3FREE, THYROIDAB in the last 72 hours.  Invalid input(s): FREET3  Other  results:   Imaging    [place] EKG SITE RITE  Result Date: 05/30/2021 If Site Rite image not attached, placement could not be confirmed due to current cardiac rhythm.  [place] EKG SITE RITE  Result Date: 05/30/2021 If Site Rite image not attached, placement could not be confirmed due to current cardiac rhythm.    Medications:     Scheduled Medications:  Chlorhexidine Gluconate Cloth  6 each Topical Daily   feeding supplement (GLUCERNA SHAKE)  237 mL Oral TID BM   ferrous sulfate  325 mg Oral Daily   furosemide  80 mg Intravenous BID   potassium chloride SA  20 mEq Oral Daily   sodium chloride flush  3 mL Intravenous Q12H    Infusions:  sodium chloride     heparin 1,400 Units/hr (05/31/21 0700)   milrinone 0.25 mcg/kg/min (05/31/21 0700)    PRN Medications: sodium chloride, acetaminophen, sodium chloride flush    Assessment/Plan   1. Acute on Chronic Biventricular Heart Failure>>Cardiogenic Shock  - likely primarily valvular in nature in setting of recurrent severe MR - TEE. 12/21 EF 40 to 45% with severe MR with multiple jets including a possible paravalvular leak. Miderate RV dysfunction severe TR - [facility] 3/22 showed normal coronaries, NICM EF 30-35%, severe MR w/ prominent v-waves in PCWP tracing, mild pulmonary venous HTN with normal CO (Fick cardiac output/index = 5.3/2.5). - Referred for re-do MVR 4/22 but did not follow-up w/ Dr. Cornelius Moras - Now admitted w/ a/c CHF w/ marked fluid overload, NYHA Class IIIb-IV symptoms and CGS - Started on empiric milrinone 0.25  - Awaiting PICC placement today. Will follow co-ox and CVPs - Lactic acid trending down 3.5>>2.5  - UOP and SCr improving w/ milrinone  - Continue IV Lasix 80 mg bid  - Hold ? blocker w/ shock  - Repeat echo/TEE  - Will need to discuss w/ [facility], transfer for re-do MV repair vs replacement    2. Severe Mitral Valve Regurgitation  - s/p MV repair with resection of ruptured anterior papillary muscle and  reconstruction of papillary chord and placement of annuloplasty ring in 2019 at [facility] - TEE 12/21 EF 40 to 45% with severe MR with multiple jets including a possible paravalvular leak. Miderate RV dysfunction severe TR - Referred for re-do MV repair 4/22 but decline surgery  - Now w/ acute decompensated HF/Shock  - optimize from CHF standpoint and try to stabilize for potential transfer to [facility] for re-do repair vs replacement    3. Paroxysmal Atrial Flutter  - several episodes in setting of severe LAE - last DC-CV in 9/22  - Currently sinus tach  - Monitor w/ Milrinone. Low  threshold for amio gtt  - Hold Eliquis. Cover w/ heparin gtt  - Keep K > 4.0 and Mg >2.0  - If redo MVR, should get MAZE    4. 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 AKI  - SCr bumped to 2.07 (baseline <1.0) - cardiorenal/ low output 2/2 severe MR  - improving w/ milrinone, 2.07>>1.79 today  - inotropic support + lasix above  6. Autoimmune disorder - He has known seronegative spondylitis and pyoderma gangrenosum.  - Previously followed by Rheumatology at [facility].     Length of Stay: 1  Brittainy Delmer Islam  05/31/2021, 7:11 AM  Advanced Heart Failure Team Pager [phone] (M-F; 7a - 5p)  Please contact [facility] for night-coverage after hours (5p -7a ) and weekends on [email or web address]  Patient seen and examined with the above-signed Advanced Practice Provider and/or Housestaff. I personally reviewed laboratory data, imaging studies and relevant notes. I independently examined the patient and formulated the important aspects of the plan. I have edited the note to reflect any of my changes or salient points. I have personally discussed the plan with the patient and/or family.   Admitted yesterday with recurrent shock in setting of low EF and severe MR.   Improved with milrinone.Diuresing well. Breathing better. Renal function  improving.   General:  Sitting up in bed No resp difficulty HEENT: normal Neck: supple. JVP to jaw  Carotids 2+ bilat; no bruits. No lymphadenopathy or thryomegaly appreciated. Cor: PMI nondisplaced. Tachy regular + MR Lungs: clear Abdomen: soft, nontender, nondistended. No hepatosplenomegaly. No bruits or masses. Good bowel sounds. Extremities: no cyanosis, clubbing, rash, 1-2+ edema + healed wounds. warm Neuro: alert & orientedx3, cranial nerves grossly intact. moves all 4 extremities w/o difficulty. Affect pleasant  Shock improved with milrinone. Continue milrinone and IV lasix.   Place PICC today and repeat TTE. Once stabilized will need w/u for MVR eitehr here or [facility]. Will d/w TCTS once we have updated images and RHC data in hand next week.   Arvilla Meres, MD  9:07 AM  '

## 2021-05-31 NOTE — Progress Notes (Signed)
Initial Nutrition Assessment  DOCUMENTATION CODES:   Not applicable  INTERVENTION:   Encourage good PO intake Multivitamin w/ minerals daily Ensure Enlive po BID, each supplement provides 350 kcal and 20 grams of protein Discontinue Glucerna  Diet education  NUTRITION DIAGNOSIS:   Increased nutrient needs related to chronic illness as evidenced by estimated needs.  GOAL:   Patient will meet greater than or equal to 90% of their needs  MONITOR:   PO intake, Supplement acceptance, Labs, Weight trends  REASON FOR ASSESSMENT:   Malnutrition Screening Tool    ASSESSMENT:   35 y.o. male presented to [facility] ED with shortness of breath and multiple episodes of emesis. PMH includes CHF. Pt admitted with acute exacerbation of CHF, acute respiratory failure, and AKI. Pt transfereed to [facility] for further management.   12/16 - PICC placed  Pt reports that his appetite has been poor for ~1 week PTA due to shortness of breath. Pt reports that he typically has eggs for breakfast and then fast food the rest of the day.  Per EMR, pt ate 90% of his breakfast today.   Pt reports a UBW of 211# and has noticed weight gain; states his shirts are getting tighter but pants are ok. Per EMR, pt has gained weight over the past year, suspect weight gain is related to fluid retention.   Discussed ONS with pt; pt agreeable to ONS. Discussed choosing low sodium, no salt added options, or limiting processed foods when shopping.  RD returned to provided pt with "Low- Sodium Nutrition Therapy" handout, pt was sleeping and would not wake, left handout at bedside.   Medications reviewed and include: Ferrous Sulfate, Lasix, Potassium Chloride, IV heparin Labs reviewed:  - Sodium 133  - BUN 35 - Creatinine 1.79 - Magnesium 1.6  UOP: 2325 mL x 24 hr I/O's: -1277 mL since admit  NUTRITION - FOCUSED PHYSICAL EXAM:  Flowsheet Row Most Recent Value  Orbital Region No depletion  Upper Arm Region No  depletion  Thoracic and Lumbar Region No depletion  Buccal Region No depletion  Temple Region No depletion  Clavicle Bone Region No depletion  Clavicle and Acromion Bone Region No depletion  Scapular Bone Region No depletion  Dorsal Hand No depletion  Patellar Region No depletion  Anterior Thigh Region No depletion  Posterior Calf Region No depletion  Edema (RD Assessment) None  Hair Reviewed  Eyes Reviewed  Mouth Reviewed  Skin Reviewed  Nails Reviewed       Diet Order:   Diet Order             Diet 2 gram sodium Room service appropriate? Yes; Fluid consistency: Thin  Diet effective now                   EDUCATION NEEDS:   Education needs have been addressed  Skin:  Skin Assessment: Reviewed RN Assessment  Last BM:  05/30/2021  Height:   Ht Readings from Last 1 Encounters:  05/30/21 5\' 9"  (1.753 m)    Weight:   Wt Readings from Last 1 Encounters:  05/31/21 100.9 kg    Ideal Body Weight:  72.7 kg  BMI:  Body mass index is 32.85 kg/m.  Estimated Nutritional Needs:   Kcal:  2200-2400  Protein:  110-125 grams  Fluid:  >/= 2.2 L    Melanny Wire 06/02/21, RD, LDN Clinical Dietitian See [facility] for contact information.

## 2021-06-01 LAB — BASIC METABOLIC PANEL
Anion gap: 9 (ref 5–15)
Anion gap: 12 (ref 5–15)
BUN: 29 mg/dL — ABNORMAL HIGH (ref 6–20)
BUN: 25 mg/dL — ABNORMAL HIGH (ref 6–20)
CO2: 29 mmol/L (ref 22–32)
CO2: 29 mmol/L (ref 22–32)
Calcium: 8.5 mg/dL — ABNORMAL LOW (ref 8.9–10.3)
Calcium: 8.4 mg/dL — ABNORMAL LOW (ref 8.9–10.3)
Chloride: 91 mmol/L — ABNORMAL LOW (ref 98–111)
Chloride: 86 mmol/L — ABNORMAL LOW (ref 98–111)
Creatinine, Ser: 1.18 mg/dL (ref 0.61–1.24)
Creatinine, Ser: 1.11 mg/dL (ref 0.61–1.24)
GFR, Estimated: >60 mL/min (ref >60)
GFR, Estimated: >60 mL/min (ref >60)
Glucose, Bld: 190 mg/dL — ABNORMAL HIGH (ref 70–99)
Glucose, Bld: 371 mg/dL — ABNORMAL HIGH (ref 70–99)
Potassium: 3 mmol/L — ABNORMAL LOW (ref 3.5–5.1)
Potassium: 3.3 mmol/L — ABNORMAL LOW (ref 3.5–5.1)
Sodium: 129 mmol/L — ABNORMAL LOW (ref 135–145)
Sodium: 127 mmol/L — ABNORMAL LOW (ref 135–145)

## 2021-06-01 LAB — CBC
HCT: 37.5 % — ABNORMAL LOW (ref 39.0–52.0)
Hemoglobin: 12.4 g/dL — ABNORMAL LOW (ref 13.0–17.0)
MCH: 28.7 pg (ref 26.0–34.0)
MCHC: 33.1 g/dL (ref 30.0–36.0)
MCV: 86.8 fL (ref 80.0–100.0)
Platelets: 188 10*3/uL (ref 150–400)
RBC: 4.32 MIL/uL (ref 4.22–5.81)
RDW: 14.9 % (ref 11.5–15.5)
WBC: 10.2 10*3/uL (ref 4.0–10.5)
nRBC: 0 % (ref 0.0–0.2)

## 2021-06-01 LAB — HEPARIN LEVEL (UNFRACTIONATED)
Heparin Unfractionated: 0.41 IU/mL (ref 0.30–0.70)
Heparin Unfractionated: 0.26 IU/mL — ABNORMAL LOW (ref 0.30–0.70)

## 2021-06-01 LAB — MAGNESIUM: Magnesium: 1.4 mg/dL — ABNORMAL LOW (ref 1.7–2.4)

## 2021-06-01 LAB — APTT: aPTT: 68 seconds — ABNORMAL HIGH (ref 24–36)

## 2021-06-01 LAB — COOXEMETRY PANEL
Carboxyhemoglobin: 1.2 % (ref 0.5–1.5)
Methemoglobin: 0.8 % (ref 0.0–1.5)
O2 Saturation: 49.3 %
Total hemoglobin: 13.1 g/dL (ref 12.0–16.0)

## 2021-06-01 MED ORDER — AMIODARONE HCL IN DEXTROSE 360-4.14 MG/200ML-% IV SOLN
30.0000 mg/h | INTRAVENOUS | Status: DC
  Administered 2021-06-01 – 2021-06-04 (×7): 30 mg/h via INTRAVENOUS
  Filled 2021-06-01 (×7): qty 200

## 2021-06-01 MED ORDER — DIGOXIN 125 MCG PO TABS
0.1250 mg | ORAL_TABLET | Freq: Every day | ORAL | Status: DC
  Administered 2021-06-01 – 2021-06-04 (×4): 0.125 mg via ORAL
  Filled 2021-06-01 (×4): qty 1

## 2021-06-01 MED ORDER — AMIODARONE HCL IN DEXTROSE 360-4.14 MG/200ML-% IV SOLN
60.0000 mg/h | INTRAVENOUS | Status: AC
  Administered 2021-06-01: 60 mg/h via INTRAVENOUS
  Filled 2021-06-01: qty 200

## 2021-06-01 MED ORDER — SPIRONOLACTONE 12.5 MG HALF TABLET
12.5000 mg | ORAL_TABLET | Freq: Every day | ORAL | Status: DC
  Administered 2021-06-01: 12.5 mg via ORAL
  Filled 2021-06-01: qty 1

## 2021-06-01 MED ORDER — POTASSIUM CHLORIDE CRYS ER 20 MEQ PO TBCR
40.0000 meq | EXTENDED_RELEASE_TABLET | Freq: Two times a day (BID) | ORAL | Status: DC
  Administered 2021-06-01 – 2021-06-02 (×4): 40 meq via ORAL
  Filled 2021-06-01 (×6): qty 2

## 2021-06-01 MED ORDER — METOLAZONE 2.5 MG PO TABS
2.5000 mg | ORAL_TABLET | Freq: Once | ORAL | Status: AC
  Administered 2021-06-01: 2.5 mg via ORAL
  Filled 2021-06-01: qty 1

## 2021-06-01 MED ORDER — POTASSIUM CHLORIDE 20 MEQ PO PACK
40.0000 meq | PACK | Freq: Once | ORAL | Status: AC
Start: 2021-06-01 — End: 2021-06-01
  Administered 2021-06-01: 40 meq via ORAL
  Filled 2021-06-01: qty 2

## 2021-06-01 MED ORDER — MAGNESIUM SULFATE 2 GM/50ML IV SOLN
2.0000 g | Freq: Once | INTRAVENOUS | Status: AC
  Administered 2021-06-01: 2 g via INTRAVENOUS
  Filled 2021-06-01: qty 50

## 2021-06-01 NOTE — Progress Notes (Signed)
ANTICOAGULATION CONSULT NOTE  Pharmacy Consult for heparin Indication: atrial fibrillation  Heparin Dosing Weight: 92 kg  Labs: Recent Labs    05/30/21 1846 05/31/21 0337 05/31/21 1324 05/31/21 1511 06/01/21 0316 06/01/21 1600 06/01/21 1659  HGB 14.4 13.2  --   --  12.4*  --   --   HCT 43.5 38.3*  --   --  37.5*  --   --   PLT 256 233  --   --  188  --   --   APTT 36 70* >200* 65* 68*  --   --   HEPARINUNFRC  --  0.92*  --   --  0.41  --  0.26*  CREATININE 2.07* 1.79*  --   --  1.18 1.11  --      Assessment: 35 yom on apixaban PTA for hx afib (LD 12/15@0918 ) presenting with acute on chronic biventricular heart failure, cardiogenic shock. Pharmacy consulted to transition apixaban to heparin. Heart failure service planning to discuss plans with [facility] for surgery options.   Confirmatory heparin level slightly subtherapeutic.  Goal of Therapy:  Heparin level 0.3-0.7 units/ml aPTT 66-102 seconds Monitor platelets by anticoagulation protocol: Yes   Plan:  Increase heparin 1550 units/h Recheck heparin level with morning labs  Fredonia Highland, PharmD, BCPS, [facility] Clinical Pharmacist [phone] Please check AMION for all [facility] numbers 06/01/2021

## 2021-06-01 NOTE — Progress Notes (Addendum)
Patient ID: Donald [name], male   DOB: 1986/06/15, 35 y.o.   MRN: [id number]     Advanced Heart Failure Rounding Note  PCP-Cardiologist: Dina Rich, MD  [facility]: Dr. 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 Previously saw Dr. Cornelius Moras for potential MVR but failed to f/u. Over last month progressive NYHA IIIB-IV HF symptoms. Presented to [facility] ER with recurrent HF and evidence of low output and AKI. Transferred to [facility] for further management. Started on empiric milrinone.   Subjective:    Started on empiric Milrinone 0.25. Co-ox 49%.  He appears to have diuresed well over the last day with Lasix 80 mg IV bid.  CVP 18-19.   Creatinine improving, down to 1.18 today.    Breathing has improved, comfortable in bed.   Echo: EF 20-25%, mild LV dilation, D-shaped septum, moderately decreased RV systolic function with mild RV enlargement, s/p MV repair with mean gradient 15 mmHg and severe MR, mild TR, dilated IVC.    Objective:   Weight Range: 100.9 kg Body mass index is 32.85 kg/m.   Vital Signs:   Temp:  [97.7 F (36.5 C)-99 F (37.2 C)] 98.8 F (37.1 C) (12/17 0000) Pulse Rate:  [69-214] 103 (12/17 0600) Resp:  [14-37] 24 (12/17 0600) BP: (93-153)/(77-126) 117/87 (12/17 0600) SpO2:  [92 %-99 %] 98 % (12/17 0600) Last BM Date: 05/30/21  Weight change: Filed Weights   05/29/21 2155 05/30/21 1800 05/31/21 0600  Weight: 95.7 kg 100.4 kg 100.9 kg    Intake/Output:   Intake/Output Summary (Last 24 hours) at 06/01/2021 0933 Last data filed at 06/01/2021 0600 Gross per 24 hour  Intake 515.14 ml  Output 4250 ml  Net -3734.86 ml      Physical Exam    General: NAD Neck: JVP 16 cm, no thyromegaly or thyroid nodule.  Lungs: Clear to auscultation bilaterally with normal respiratory effort. CV: Lateral PMI.  Heart mildly  tachy, regular S1/S2, +S3, 3/6 HSM apex.  1+ edema to knees.  Abdomen: Soft, nontender, no hepatosplenomegaly, no distention.  Skin: Intact without lesions or rashes.  Neurologic: Alert and oriented x 3.  Psych: Normal affect. Extremities: No clubbing or cyanosis.  HEENT: Normal.    Telemetry   Sinus tachy, low 100s (personally reviewed)   Labs    CBC Recent Labs    05/29/21 2214 05/30/21 0610 05/31/21 0337 06/01/21 0316  WBC 10.1   < > 9.6 10.2  NEUTROABS 6.5  --   --   --   HGB 14.4   < > 13.2 12.4*  HCT 43.5   < > 38.3* 37.5*  MCV 88.8   < > 84.5 86.8  PLT 241   < > 233 188   < > = values in this interval not displayed.   Basic Metabolic Panel Recent Labs    63/78/58 0610 05/30/21 1846 05/31/21 0337 06/01/21 0316  NA 133*   < > 133* 129*  K 3.8   < > 3.9 3.0*  CL 100   < > 100 91*  CO2 20*   < > 22 29  GLUCOSE 130*   < > 115* 190*  BUN 28*   < > 35* 29*  CREATININE 1.64*   < > 1.79* 1.18  CALCIUM 8.7*   < > 8.7* 8.5*  MG 1.6*  --   --  1.4*  PHOS 4.2  --   --   --    < > = values in this interval not displayed.   Liver Function Tests Recent Labs    05/30/21 0610 05/30/21 1846  AST 49* 56*  ALT 27 31  ALKPHOS 91 99  BILITOT 1.0 1.8*  PROT 7.3 7.4  ALBUMIN 3.1* 3.0*   No results for input(s): LIPASE, AMYLASE in the last 72 hours. Cardiac Enzymes No results for input(s): CKTOTAL, CKMB, CKMBINDEX, TROPONINI in the last 72 hours.  BNP: BNP (last 3 results) Recent Labs    05/09/21 1945 05/23/21 1727 05/29/21 2214  BNP 1,301.0* 766.0* 2,863.0*    ProBNP (last 3 results) No results for input(s): PROBNP in the last 8760 hours.   D-Dimer No results for input(s): DDIMER in the last 72 hours. Hemoglobin A1C No results for input(s): HGBA1C in the last 72 hours. Fasting Lipid Panel No results for input(s): CHOL, HDL, LDLCALC, TRIG, CHOLHDL, LDLDIRECT in the last 72 hours. Thyroid Function Tests No results for input(s): TSH, T4TOTAL, T3FREE,  THYROIDAB in the last 72 hours.  Invalid input(s): FREET3  Other results:   Imaging    ECHOCARDIOGRAM COMPLETE  Result Date: 05/31/2021    ECHOCARDIOGRAM REPORT   Patient Name:   Donald [name] Date of Exam: 05/31/2021 Medical Rec #:  [id number]           Height:       69.0 in Accession #:    [id number]          Weight:       222.4 lb Date of Birth:  [birth year]          BSA:          2.162 m Patient Age:    35 years            BP:           110/88 mmHg Patient Gender: M                   HR:           108 bpm. Exam Location:  Inpatient Procedure: 2D Echo, Cardiac Doppler, Color Doppler and 3D Echo Indications:    Mitral Regurgitation  History:        Patient has prior history of Echocardiogram examinations, most                 recent 05/23/2020. Mitral Valve Disease; Risk Factors:Current                 Smoker.  Sonographer:    Roosvelt Maser RDCS Referring Phys: [phone] Roxy Horseman SIMMONS IMPRESSIONS  1. Left ventricular ejection fraction, by estimation, is 20 to 25%. The left ventricle has severely decreased function. The left ventricle demonstrates global hypokinesis. The left ventricular internal cavity size was mildly dilated. There is mild left ventricular hypertrophy. There is the interventricular septum is flattened in diastole ('D' shaped left ventricle), consistent with right ventricular volume overload.  2. Right ventricular systolic function is moderately reduced. The right ventricular size is moderately enlarged. There is moderately elevated pulmonary artery systolic pressure.  3. Left atrial size was severely dilated.  4. Right atrial size was moderately dilated.  5. S/p mitral valve repair. Elevated MV mean gradient 15 mmHg with increased HR 108 bpm. MR is severe as reported in TEE 05/23/2020.  6. Aortic valve regurgitation is mild.  7. The inferior vena cava is dilated in size with <50%  respiratory variability, suggesting right atrial pressure of 15 mmHg. FINDINGS  Left Ventricle: Left  ventricular ejection fraction, by estimation, is 20 to 25%. The left ventricle has severely decreased function. The left ventricle demonstrates global hypokinesis. The left ventricular internal cavity size was mildly dilated. There is mild left ventricular hypertrophy. The interventricular septum is flattened in diastole ('D' shaped left ventricle), consistent with right ventricular volume overload. Right Ventricle: The right ventricular size is moderately enlarged. No increase in right ventricular wall thickness. Right ventricular systolic function is moderately reduced. There is moderately elevated pulmonary artery systolic pressure. The tricuspid  regurgitant velocity is 2.79 m/s, and with an assumed right atrial pressure of 15 mmHg, the estimated right ventricular systolic pressure is 46.1 mmHg. Left Atrium: Left atrial size was severely dilated. Right Atrium: Right atrial size was moderately dilated. Pericardium: There is no evidence of pericardial effusion. Mitral Valve: S/p mitral valve repair. Elevated MV mean gradient 15 mmHg with increased HR 108 bpm. MR is severe as reported in TEE 05/23/2020. MV peak gradient, 27.2 mmHg. The mean mitral valve gradient is 15.0 mmHg. Tricuspid Valve: Tricuspid valve regurgitation is mild. Aortic Valve: Aortic valve regurgitation is mild. Aortic valve mean gradient measures 4.0 mmHg. Aortic valve peak gradient measures 7.3 mmHg. Aortic valve area, by VTI measures 2.35 cm. Pulmonic Valve: Pulmonic valve regurgitation is mild. Aorta: The aortic root and ascending aorta are structurally normal, with no evidence of dilitation. Venous: The inferior vena cava is dilated in size with less than 50% respiratory variability, suggesting right atrial pressure of 15 mmHg.  LEFT VENTRICLE PLAX 2D LVIDd:         6.00 cm LVIDs:         5.40 cm LV PW:         1.20 cm LV IVS:        1.30 cm LVOT diam:     2.30 cm      3D Volume EF: LV SV:         47           3D EF:        41 % LV SV Index:    22           LV EDV:       366 ml LVOT Area:     4.15 cm     LV ESV:       217 ml                             LV SV:        149 ml  LV Volumes (MOD) LV vol d, MOD A2C: 204.0 ml LV vol d, MOD A4C: 310.0 ml LV vol s, MOD A2C: 154.0 ml LV vol s, MOD A4C: 241.0 ml LV SV MOD A2C:     50.0 ml LV SV MOD A4C:     310.0 ml LV SV MOD BP:      72.0 ml RIGHT VENTRICLE             IVC RV Basal diam:  5.10 cm     IVC diam: 2.60 cm RV Mid diam:    4.60 cm RV S prime:     10.70 cm/s TAPSE (M-mode): 0.9 cm LEFT ATRIUM           Index        RIGHT ATRIUM  Index LA diam:      5.00 cm 2.31 cm/m   RA Area:     29.00 cm LA Vol (A2C): 95.0 ml 43.95 ml/m  RA Volume:   115.00 ml 53.20 ml/m  AORTIC VALVE AV Area (Vmax):    2.69 cm AV Area (Vmean):   2.46 cm AV Area (VTI):     2.35 cm AV Vmax:           135.00 cm/s AV Vmean:          91.900 cm/s AV VTI:            0.198 m AV Peak Grad:      7.3 mmHg AV Mean Grad:      4.0 mmHg LVOT Vmax:         87.50 cm/s LVOT Vmean:        54.500 cm/s LVOT VTI:          0.112 m LVOT/AV VTI ratio: 0.57  AORTA Ao Root diam: 3.60 cm Ao Asc diam:  3.40 cm MITRAL VALVE              TRICUSPID VALVE MV Area VTI:  0.78 cm    TR Peak grad:   31.1 mmHg MV Peak grad: 27.2 mmHg   TR Vmax:        279.00 cm/s MV Mean grad: 15.0 mmHg MV Vmax:      2.61 m/s    SHUNTS MV Vmean:     181.0 cm/s  Systemic VTI:  0.11 m                           Systemic Diam: 2.30 cm Carolan Clines Electronically signed by Carolan Clines Signature Date/Time: 05/31/2021/6:53:36 PM    Final      Medications:     Scheduled Medications:  Chlorhexidine Gluconate Cloth  6 each Topical Daily   digoxin  0.125 mg Oral Daily   feeding supplement  237 mL Oral BID BM   ferrous sulfate  325 mg Oral Daily   furosemide  80 mg Intravenous BID   metolazone  2.5 mg Oral Once   multivitamin with minerals  1 tablet Oral Daily   potassium chloride SA  40 mEq Oral BID   sodium chloride flush  10-40 mL Intracatheter Q12H   sodium chloride  flush  3 mL Intravenous Q12H   spironolactone  12.5 mg Oral Daily    Infusions:  sodium chloride Stopped (05/31/21 1206)   heparin 1,450 Units/hr (06/01/21 0600)   milrinone 0.25 mcg/kg/min (06/01/21 0600)    PRN Medications: sodium chloride, acetaminophen, sodium chloride flush, sodium chloride flush    Assessment/Plan   1. Acute on Chronic Biventricular Heart Failure>>Cardiogenic Shock  - likely primarily valvular in nature in setting of recurrent severe MR - TEE. 12/21 EF 40 to 45% with severe MR with multiple jets including a possible paravalvular leak. Miderate RV dysfunction severe TR - [facility] 3/22 showed normal coronaries, NICM EF 30-35%, severe MR w/ prominent v-waves in PCWP tracing, mild pulmonary venous HTN with normal CO (Fick cardiac output/index = 5.3/2.5). - Referred for re-do MVR 4/22 but did not follow-up w/ Dr. Cornelius Moras - Now admitted w/ a/c CHF w/ marked fluid overload, NYHA Class IIIb-IV symptoms and CGS - Echo this admission shows progressive LV dysfunction, EF 20-25%, mild LV dilation, D-shaped septum, moderately decreased RV systolic function with mild RV enlargement, s/p MV repair with mean gradient 15 mmHg and  severe MR, mild TR, dilated IVC.  - Started on empiric milrinone 0.25, co-ox 49% this morning and will increase milrinone to 0.375 mcg/kg/min.  - Add digoxin 0.125 daily.   - Add spironolactone 12.5 daily.  - CVP 18-19, continue Lasix 80 mg IV bid and give metolazone 2.5 x 1.  - Replace K, Mg and repeat BMET in pm.  - Hold ? blocker w/ shock  - Will need RHC after further diuresis.  - Suspect primary severe MR with degenerated valve repair, but he has had progressive LV dysfunction with EF down to 20-25%.  Mitral valve replacement would be high risk.  Will need to stabilize and review with TCTS.    2. Severe Mitral Valve Regurgitation  - s/p MV repair with resection of ruptured anterior papillary muscle and reconstruction of papillary chord and placement of  annuloplasty ring in 2019 at [facility] - TEE 12/21 EF 40 to 45% with severe MR with multiple jets including a possible paravalvular leak. Miderate RV dysfunction severe TR - Referred for re-do MV repair 4/22 but decline surgery  - Now w/ acute decompensated HF/Shock  - Echo this admission with severe MR of repaired valve, mean gradient 15 mmHg.  Suspect there is a degree of mitral stenosis but some of the gradient is due to high flow across the valve.  - Will be high risk for MVR given progressive LV dysfunction (likely due to MR), now with EF 20-25%.  Will need to stabilize/diurese then review options with TCTS.    3. Paroxysmal Atrial Flutter  - several episodes in setting of severe LAE - last DC-CV in 9/22  - Currently sinus tachy  - Monitor w/ Milrinone. Low threshold for amio gtt  - Hold Eliquis. Cover w/ heparin gtt  - Keep K > 4.0 and Mg >2.0  - If redo MVR, should get MAZE    4. 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 AKI  - SCr bumped to 2.07 (baseline <1.0) - cardiorenal/ low output 2/2 severe MR  - improving w/ milrinone, 2.07>>1.79>>1.18 today  - inotropic support + lasix above  6. Autoimmune disorder - He has known seronegative spondylitis and pyoderma gangrenosum.  - Previously followed by Rheumatology at [facility].   CRITICAL CARE Performed by: Marca Ancona  Total critical care time: 35 minutes  Critical care time was exclusive of separately billable procedures and treating other patients.  Critical care was necessary to treat or prevent imminent or life-threatening deterioration.  Critical care was time spent personally by me on the following activities: development of treatment plan with patient and/or surrogate as well as nursing, discussions with consultants, evaluation of patient's response to treatment, examination of patient, obtaining history from patient or surrogate, ordering and  performing treatments and interventions, ordering and review of laboratory studies, ordering and review of radiographic studies, pulse oximetry and re-evaluation of patient's condition.  Marca Ancona 06/01/2021 9:42 AM  NSVT run, history of atrial arrhythmias.  With increasing milrinone, will add amiodarone gtt.  Confirm rhythm with ECG.  Marca Ancona 06/01/2021 10:58 AM

## 2021-06-01 NOTE — Progress Notes (Signed)
ANTICOAGULATION CONSULT NOTE  Pharmacy Consult for heparin Indication: atrial fibrillation  Heparin Dosing Weight: 92 kg  Labs: Recent Labs    05/30/21 1846 05/31/21 0337 05/31/21 1324 05/31/21 1511 06/01/21 0316  HGB 14.4 13.2  --   --  12.4*  HCT 43.5 38.3*  --   --  37.5*  PLT 256 233  --   --  188  APTT 36 70* >200* 65* 68*  HEPARINUNFRC  --  0.92*  --   --  0.41  CREATININE 2.07* 1.79*  --   --  1.18     Assessment: 35 yom on apixaban PTA for hx afib (LD 12/15@0918 ) presenting with acute on chronic biventricular heart failure, cardiogenic shock. Pharmacy consulted to transition apixaban to heparin. Heart failure service planning to discuss plans with [facility] for surgery options.   AM aPTT drawn peripherally is within range at 68 on 1450 units/h. Heparin level this AM is now within range at 0.41. Can move to monitoring via HL as now correlating. Hgb dropped from 13.2 to 12.4 and plts down from 233 to 188, however no issues with bleeding per RN. Will continue to monitor.   Goal of Therapy:  Heparin level 0.3-0.7 units/ml aPTT 66-102 seconds Monitor platelets by anticoagulation protocol: Yes   Plan:  Continue heparin at 1450 units/h Recheck HL in 8 hours Daily CBC while on heparin  Drake Leach, PharmD PGY2 Cardiology Pharmacy Resident Phone: [phone] 06/01/2021  7:28 AM  Please check [email or web address] for unit-specific pharmacy phone numbers.

## 2021-06-02 ENCOUNTER — Encounter (HOSPITAL_COMMUNITY): Payer: Self-pay | Admitting: Internal Medicine

## 2021-06-02 DIAGNOSIS — R570 Cardiogenic shock: Secondary | ICD-10-CM

## 2021-06-02 LAB — BASIC METABOLIC PANEL
Anion gap: 9 (ref 5–15)
BUN: 23 mg/dL — ABNORMAL HIGH (ref 6–20)
CO2: 32 mmol/L (ref 22–32)
Calcium: 8.6 mg/dL — ABNORMAL LOW (ref 8.9–10.3)
Chloride: 86 mmol/L — ABNORMAL LOW (ref 98–111)
Creatinine, Ser: 0.95 mg/dL (ref 0.61–1.24)
GFR, Estimated: >60 mL/min (ref >60)
Glucose, Bld: 207 mg/dL — ABNORMAL HIGH (ref 70–99)
Potassium: 3.2 mmol/L — ABNORMAL LOW (ref 3.5–5.1)
Sodium: 127 mmol/L — ABNORMAL LOW (ref 135–145)

## 2021-06-02 LAB — APTT: aPTT: 83 seconds — ABNORMAL HIGH (ref 24–36)

## 2021-06-02 LAB — COOXEMETRY PANEL
Carboxyhemoglobin: 1.1 % (ref 0.5–1.5)
Carboxyhemoglobin: 0.9 % (ref 0.5–1.5)
Methemoglobin: 0.6 % (ref 0.0–1.5)
Methemoglobin: 0.7 % (ref 0.0–1.5)
O2 Saturation: 53 %
O2 Saturation: 41.2 %
Total hemoglobin: 12.7 g/dL (ref 12.0–16.0)
Total hemoglobin: 14.2 g/dL (ref 12.0–16.0)

## 2021-06-02 LAB — GLUCOSE, CAPILLARY
Glucose-Capillary: 131 mg/dL — ABNORMAL HIGH (ref 70–99)
Glucose-Capillary: 139 mg/dL — ABNORMAL HIGH (ref 70–99)

## 2021-06-02 LAB — CBC
HCT: 37.5 % — ABNORMAL LOW (ref 39.0–52.0)
Hemoglobin: 12.4 g/dL — ABNORMAL LOW (ref 13.0–17.0)
MCH: 28.3 pg (ref 26.0–34.0)
MCHC: 33.1 g/dL (ref 30.0–36.0)
MCV: 85.6 fL (ref 80.0–100.0)
Platelets: 211 10*3/uL (ref 150–400)
RBC: 4.38 MIL/uL (ref 4.22–5.81)
RDW: 14.8 % (ref 11.5–15.5)
WBC: 9.3 10*3/uL (ref 4.0–10.5)
nRBC: 0 % (ref 0.0–0.2)

## 2021-06-02 LAB — HEPARIN LEVEL (UNFRACTIONATED)
Heparin Unfractionated: 0.34 IU/mL (ref 0.30–0.70)
Heparin Unfractionated: 0.34 IU/mL (ref 0.30–0.70)

## 2021-06-02 LAB — MAGNESIUM: Magnesium: 1.5 mg/dL — ABNORMAL LOW (ref 1.7–2.4)

## 2021-06-02 MED ORDER — METOLAZONE 2.5 MG PO TABS
2.5000 mg | ORAL_TABLET | Freq: Once | ORAL | Status: AC
  Administered 2021-06-02: 11:00:00 2.5 mg via ORAL
  Filled 2021-06-02: qty 1

## 2021-06-02 MED ORDER — SPIRONOLACTONE 25 MG PO TABS
25.0000 mg | ORAL_TABLET | Freq: Every day | ORAL | Status: DC
  Administered 2021-06-02 – 2021-06-04 (×3): 25 mg via ORAL
  Filled 2021-06-02 (×3): qty 1

## 2021-06-02 MED ORDER — MAGNESIUM SULFATE 2 GM/50ML IV SOLN
2.0000 g | Freq: Once | INTRAVENOUS | Status: AC
  Administered 2021-06-02: 05:00:00 2 g via INTRAVENOUS
  Filled 2021-06-02: qty 50

## 2021-06-02 MED ORDER — POTASSIUM CHLORIDE CRYS ER 20 MEQ PO TBCR
40.0000 meq | EXTENDED_RELEASE_TABLET | Freq: Once | ORAL | Status: AC
  Administered 2021-06-02: 11:00:00 40 meq via ORAL
  Filled 2021-06-02: qty 2

## 2021-06-02 MED ORDER — POTASSIUM CHLORIDE CRYS ER 10 MEQ PO TBCR
30.0000 meq | EXTENDED_RELEASE_TABLET | Freq: Once | ORAL | Status: AC
  Administered 2021-06-02: 05:00:00 30 meq via ORAL
  Filled 2021-06-02: qty 3

## 2021-06-02 NOTE — Progress Notes (Signed)
ANTICOAGULATION CONSULT NOTE  Pharmacy Consult for heparin Indication: atrial fibrillation  Heparin Dosing Weight: 92 kg  Labs: Recent Labs    05/31/21 0337 05/31/21 1324 05/31/21 1511 06/01/21 0316 06/01/21 1600 06/01/21 1659 06/02/21 0314  HGB 13.2  --   --  12.4*  --   --  12.4*  HCT 38.3*  --   --  37.5*  --   --  37.5*  PLT 233  --   --  188  --   --  211  APTT 70*   < > 65* 68*  --   --  83*  HEPARINUNFRC 0.92*  --   --  0.41  --  0.26* 0.34  CREATININE 1.79*  --   --  1.18 1.11  --  0.95   < > = values in this interval not displayed.     Assessment: 35 yom on apixaban PTA for hx afib (LD 12/15@0918 ) presenting with acute on chronic biventricular heart failure, cardiogenic shock. Pharmacy consulted to transition apixaban to heparin. Heart failure service planning to discuss plans with [facility] for surgery options.   Heparin level this AM in range at 0.34 on 1550 units/hour. CBC stable, no issues with bleeding per RN.   Goal of Therapy:  Heparin level 0.3-0.7 units/ml aPTT 66-102 seconds Monitor platelets by anticoagulation protocol: Yes   Plan:  Continue heparin gtt at 1550 units/h Recheck heparin level in 6 hours Daily HL and CBC while on heparin  Drake Leach, PharmD PGY2 Cardiology Pharmacy Resident Phone: [phone]  06/02/2021  7:15 AM  Please check [email or web address] for unit-specific pharmacy phone numbers.

## 2021-06-02 NOTE — Progress Notes (Signed)
ANTICOAGULATION CONSULT NOTE  Pharmacy Consult for heparin Indication: atrial fibrillation  Heparin Dosing Weight: 92 kg  Labs: Recent Labs    05/31/21 0337 05/31/21 1324 05/31/21 1511 06/01/21 0316 06/01/21 1600 06/01/21 1659 06/02/21 0314 06/02/21 1606  HGB 13.2  --   --  12.4*  --   --  12.4*  --   HCT 38.3*  --   --  37.5*  --   --  37.5*  --   PLT 233  --   --  188  --   --  211  --   APTT 70*   < > 65* 68*  --   --  83*  --   HEPARINUNFRC 0.92*  --   --  0.41  --  0.26* 0.34 0.34  CREATININE 1.79*  --   --  1.18 1.11  --  0.95  --    < > = values in this interval not displayed.     Assessment: 35 yom on apixaban PTA for hx afib (LD 12/15@0918 ) presenting with acute on chronic biventricular heart failure, cardiogenic shock. Pharmacy consulted to transition apixaban to heparin. Heart failure service planning to discuss plans with [facility] for surgery options.   Heparin level this AM in range at 0.34 on 1550 units/hour.   Goal of Therapy:  Heparin level 0.3-0.7 units/ml aPTT 66-102 seconds Monitor platelets by anticoagulation protocol: Yes   Plan:  Continue heparin gtt at 1550 units/h Daily HL and CBC while on heparin   Fredonia Highland, PharmD, BCPS, [facility] Clinical Pharmacist [phone] Please check AMION for all [facility] Pharmacy numbers 06/02/2021

## 2021-06-02 NOTE — Progress Notes (Addendum)
Patient ID: Donald [name], male   DOB: 1985/08/02, 35 y.o.   MRN: [id number]     Advanced Heart Failure Rounding Note  PCP-Cardiologist: Dina Rich, MD  [facility]: Dr. 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 Previously saw Dr. Cornelius Moras for potential MVR but failed to f/u. Over last month progressive NYHA IIIB-IV HF symptoms. Presented to [facility] ER with recurrent HF and evidence of low output and AKI. Transferred to [facility] for further management. Started on empiric milrinone.   Subjective:    Milrinone increased to 0.375 yesterday, early am co-ox still low at 53% but now he is diuresing well with weight down.  CVP still 19-20 range.     Creatinine improving, down to 0.95 today.  Na lower 127.   Breathing better.   Echo: EF 20-25%, mild LV dilation, D-shaped septum, moderately decreased RV systolic function with mild RV enlargement, s/p MV repair with mean gradient 15 mmHg and severe MR, mild TR, dilated IVC.    Objective:   Weight Range: 96.9 kg Body mass index is 31.55 kg/m.   Vital Signs:   Temp:  [98.1 F (36.7 C)-99 F (37.2 C)] 98.3 F (36.8 C) (12/18 0800) Pulse Rate:  [51-116] 51 (12/18 0700) Resp:  [13-44] 23 (12/18 0700) BP: (101-132)/(66-108) 128/93 (12/18 0700) SpO2:  [88 %-99 %] 95 % (12/18 0700) Weight:  [96.9 kg] 96.9 kg (12/18 0500) Last BM Date: 05/30/21  Weight change: Filed Weights   05/30/21 1800 05/31/21 0600 06/02/21 0500  Weight: 100.4 kg 100.9 kg 96.9 kg    Intake/Output:   Intake/Output Summary (Last 24 hours) at 06/02/2021 0929 Last data filed at 06/02/2021 0700 Gross per 24 hour  Intake 1035.51 ml  Output 4800 ml  Net -3764.49 ml      Physical Exam    General: NAD Neck: JVP 16 cm, no thyromegaly or thyroid nodule.  Lungs: Clear to auscultation bilaterally with normal respiratory  effort. CV: Nondisplaced PMI.  Heart regular S1/S2, no S3/S4, 3/6 HSM apex.  1+ ankle edema.  Abdomen: Soft, nontender, no hepatosplenomegaly, no distention.  Skin: Intact without lesions or rashes.  Neurologic: Alert and oriented x 3.  Psych: Normal affect. Extremities: No clubbing or cyanosis.  HEENT: Normal.   Telemetry   Sinus tachy, low 100s (personally reviewed)   Labs    CBC Recent Labs    06/01/21 0316 06/02/21 0314  WBC 10.2 9.3  HGB 12.4* 12.4*  HCT 37.5* 37.5*  MCV 86.8 85.6  PLT 188 211   Basic Metabolic Panel Recent Labs    12/14/14 0316 06/01/21 1600 06/02/21 0314  NA 129* 127* 127*  K 3.0* 3.3* 3.2*  CL 91* 86* 86*  CO2 29 29 32  GLUCOSE 190* 371* 207*  BUN 29* 25* 23*  CREATININE 1.18 1.11 0.95  CALCIUM 8.5* 8.4* 8.6*  MG 1.4*  --  1.5*   Liver Function Tests Recent Labs    05/30/21 1846  AST 56*  ALT 31  ALKPHOS 99  BILITOT 1.8*  PROT 7.4  ALBUMIN 3.0*   No results for input(s): LIPASE, AMYLASE in the last 72 hours. Cardiac Enzymes No results for input(s): CKTOTAL, CKMB, CKMBINDEX, TROPONINI in the last 72 hours.  BNP: BNP (last 3 results) Recent Labs    05/09/21 1945 05/23/21 1727 05/29/21 2214  BNP 1,301.0* 766.0*  2,863.0*    ProBNP (last 3 results) No results for input(s): PROBNP in the last 8760 hours.   D-Dimer No results for input(s): DDIMER in the last 72 hours. Hemoglobin A1C No results for input(s): HGBA1C in the last 72 hours. Fasting Lipid Panel No results for input(s): CHOL, HDL, LDLCALC, TRIG, CHOLHDL, LDLDIRECT in the last 72 hours. Thyroid Function Tests No results for input(s): TSH, T4TOTAL, T3FREE, THYROIDAB in the last 72 hours.  Invalid input(s): FREET3  Other results:   Imaging    No results found.   Medications:     Scheduled Medications:  Chlorhexidine Gluconate Cloth  6 each Topical Daily   digoxin  0.125 mg Oral Daily   feeding supplement  237 mL Oral BID BM   ferrous sulfate   325 mg Oral Daily   furosemide  80 mg Intravenous BID   metolazone  2.5 mg Oral Once   multivitamin with minerals  1 tablet Oral Daily   potassium chloride SA  40 mEq Oral BID   potassium chloride  40 mEq Oral Once   sodium chloride flush  10-40 mL Intracatheter Q12H   sodium chloride flush  3 mL Intravenous Q12H   spironolactone  25 mg Oral Daily    Infusions:  sodium chloride Stopped (05/31/21 1206)   amiodarone 30 mg/hr (06/02/21 0700)   heparin 1,550 Units/hr (06/02/21 0700)   milrinone 0.375 mcg/kg/min (06/02/21 0700)    PRN Medications: sodium chloride, acetaminophen, sodium chloride flush, sodium chloride flush    Assessment/Plan   1. Acute on Chronic Biventricular Heart Failure>>Cardiogenic Shock  - likely primarily valvular in nature in setting of recurrent severe MR - TEE. 12/21 EF 40 to 45% with severe MR with multiple jets including a possible paravalvular leak. Miderate RV dysfunction severe TR - [facility] 3/22 showed normal coronaries, NICM EF 30-35%, severe MR w/ prominent v-waves in PCWP tracing, mild pulmonary venous HTN with normal CO (Fick cardiac output/index = 5.3/2.5). - Referred for re-do MVR 4/22 but did not follow-up w/ Dr. Cornelius Moras - Now admitted w/ a/c CHF w/ marked fluid overload, NYHA Class IIIb-IV symptoms and CGS - Echo this admission shows progressive LV dysfunction, EF 20-25%, mild LV dilation, D-shaped septum, moderately decreased RV systolic function with mild RV enlargement, s/p MV repair with mean gradient 15 mmHg and severe MR, mild TR, dilated IVC.  - Milrinone now at 0.375 mcg/kg/min, early am co-ox 53%.  He is diuresing better and creatinine improving, will repeat co-ox now before increasing milrinone.  - Continue digoxin 0.125 daily.   - Increase spironolactone to 25 mg daily.  - CVP 19-20, continue Lasix 80 mg IV bid and give metolazone 2.5 x 1 again.  Good diuresis yesterday on this regimen.  - Replace K.  - Hold ? blocker w/ shock  - Will  need RHC after further diuresis.  - Suspect primary severe MR with degenerated valve repair, but he has had progressive LV dysfunction with EF down to 20-25%.  Mitral valve replacement would be high risk.  Will need to stabilize and review with TCTS.    2. Severe Mitral Valve Regurgitation  - s/p MV repair with resection of ruptured anterior papillary muscle and reconstruction of papillary chord and placement of annuloplasty ring in 2019 at [facility] - TEE 12/21 EF 40 to 45% with severe MR with multiple jets including a possible paravalvular leak. Miderate RV dysfunction severe TR - Referred for re-do MV repair 4/22 but decline surgery  - Now w/ acute  decompensated HF/Shock  - Echo this admission with severe MR of repaired valve, mean gradient 15 mmHg.  Suspect there is a degree of mitral stenosis but some of the gradient is due to high flow across the valve.  - Will be high risk for MVR given progressive LV dysfunction (likely due to MR), now with EF 20-25%.  Will need to stabilize/diurese then review options with TCTS.    3. Paroxysmal Atrial Flutter  - several episodes in setting of severe LAE - last DC-CV in 9/22  - Currently sinus tachy  - Monitor w/ Milrinone, now on amiodarone gtt with NSVT yesterday.   - Hold Eliquis. Cover w/ heparin gtt  - Keep K > 4.0 and Mg >2.0  - If redo MVR, should get MAZE    4. 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 AKI  - SCr bumped to 2.07 (baseline <1.0) - cardiorenal/ low output 2/2 severe MR  - improving w/ milrinone, 2.07>>1.79>>1.18>>0.95 today  - inotropic support + lasix above  6. Autoimmune disorder - He has known seronegative spondylitis and pyoderma gangrenosum.  - Previously followed by Rheumatology at [facility].   7. NSVT - Now on amiodarone gtt.  Would continue while on milrinone.   8. Hyponatremia - Hypervolemic hyponatremia.  Fluid restrict 1500 cc.  Tolvaptan if  drops further.   CRITICAL CARE Performed by: Marca Ancona  Total critical care time: 35 minutes  Critical care time was exclusive of separately billable procedures and treating other patients.  Critical care was necessary to treat or prevent imminent or life-threatening deterioration.  Critical care was time spent personally by me on the following activities: development of treatment plan with patient and/or surrogate as well as nursing, discussions with consultants, evaluation of patient's response to treatment, examination of patient, obtaining history from patient or surrogate, ordering and performing treatments and interventions, ordering and review of laboratory studies, ordering and review of radiographic studies, pulse oximetry and re-evaluation of patient's condition.  Marca Ancona 06/02/2021 9:29 AM

## 2021-06-03 DIAGNOSIS — I5021 Acute systolic (congestive) heart failure: Secondary | ICD-10-CM

## 2021-06-03 LAB — BASIC METABOLIC PANEL
Anion gap: 11 (ref 5–15)
Anion gap: 9 (ref 5–15)
BUN: 23 mg/dL — ABNORMAL HIGH (ref 6–20)
BUN: 24 mg/dL — ABNORMAL HIGH (ref 6–20)
CO2: 32 mmol/L (ref 22–32)
CO2: 34 mmol/L — ABNORMAL HIGH (ref 22–32)
Calcium: 9 mg/dL (ref 8.9–10.3)
Calcium: 9.5 mg/dL (ref 8.9–10.3)
Chloride: 81 mmol/L — ABNORMAL LOW (ref 98–111)
Chloride: 86 mmol/L — ABNORMAL LOW (ref 98–111)
Creatinine, Ser: 0.99 mg/dL (ref 0.61–1.24)
Creatinine, Ser: 1.12 mg/dL (ref 0.61–1.24)
GFR, Estimated: >60 mL/min (ref >60)
GFR, Estimated: >60 mL/min (ref >60)
Glucose, Bld: 253 mg/dL — ABNORMAL HIGH (ref 70–99)
Glucose, Bld: 146 mg/dL — ABNORMAL HIGH (ref 70–99)
Potassium: 3.3 mmol/L — ABNORMAL LOW (ref 3.5–5.1)
Potassium: 4.1 mmol/L (ref 3.5–5.1)
Sodium: 124 mmol/L — ABNORMAL LOW (ref 135–145)
Sodium: 129 mmol/L — ABNORMAL LOW (ref 135–145)

## 2021-06-03 LAB — CBC
HCT: 37 % — ABNORMAL LOW (ref 39.0–52.0)
Hemoglobin: 12.6 g/dL — ABNORMAL LOW (ref 13.0–17.0)
MCH: 29 pg (ref 26.0–34.0)
MCHC: 34.1 g/dL (ref 30.0–36.0)
MCV: 85.1 fL (ref 80.0–100.0)
Platelets: 221 10*3/uL (ref 150–400)
RBC: 4.35 MIL/uL (ref 4.22–5.81)
RDW: 14.6 % (ref 11.5–15.5)
WBC: 8.6 10*3/uL (ref 4.0–10.5)
nRBC: 0 % (ref 0.0–0.2)

## 2021-06-03 LAB — HEPATIC FUNCTION PANEL
ALT: 24 U/L (ref 0–44)
AST: 48 U/L — ABNORMAL HIGH (ref 15–41)
Albumin: 2.8 g/dL — ABNORMAL LOW (ref 3.5–5.0)
Alkaline Phosphatase: 97 U/L (ref 38–126)
Bilirubin, Direct: 0.1 mg/dL (ref 0.0–0.2)
Indirect Bilirubin: 1 mg/dL — ABNORMAL HIGH (ref 0.3–0.9)
Total Bilirubin: 1.1 mg/dL (ref 0.3–1.2)
Total Protein: 7.8 g/dL (ref 6.5–8.1)

## 2021-06-03 LAB — COOXEMETRY PANEL
Carboxyhemoglobin: 1.1 % (ref 0.5–1.5)
Carboxyhemoglobin: 1.2 % (ref 0.5–1.5)
Methemoglobin: 0.8 % (ref 0.0–1.5)
Methemoglobin: 0.7 % (ref 0.0–1.5)
O2 Saturation: 52.9 %
O2 Saturation: 62.7 %
Total hemoglobin: 12.7 g/dL (ref 12.0–16.0)
Total hemoglobin: 14.1 g/dL (ref 12.0–16.0)

## 2021-06-03 LAB — MAGNESIUM: Magnesium: 1.5 mg/dL — ABNORMAL LOW (ref 1.7–2.4)

## 2021-06-03 LAB — APTT: aPTT: 83 seconds — ABNORMAL HIGH (ref 24–36)

## 2021-06-03 LAB — HEPARIN LEVEL (UNFRACTIONATED): Heparin Unfractionated: 0.42 IU/mL (ref 0.30–0.70)

## 2021-06-03 MED ORDER — TOLVAPTAN 15 MG PO TABS
15.0000 mg | ORAL_TABLET | Freq: Once | ORAL | Status: AC
  Administered 2021-06-03: 10:00:00 15 mg via ORAL
  Filled 2021-06-03: qty 1

## 2021-06-03 MED ORDER — MAGNESIUM SULFATE 2 GM/50ML IV SOLN
2.0000 g | Freq: Once | INTRAVENOUS | Status: AC
  Administered 2021-06-03: 13:00:00 2 g via INTRAVENOUS
  Filled 2021-06-03: qty 50

## 2021-06-03 MED ORDER — MAGNESIUM SULFATE 4 GM/100ML IV SOLN
4.0000 g | Freq: Once | INTRAVENOUS | Status: AC
  Administered 2021-06-03: 11:00:00 4 g via INTRAVENOUS
  Filled 2021-06-03: qty 100

## 2021-06-03 MED ORDER — POTASSIUM CHLORIDE CRYS ER 20 MEQ PO TBCR
40.0000 meq | EXTENDED_RELEASE_TABLET | ORAL | Status: AC
  Administered 2021-06-03 (×3): 40 meq via ORAL
  Filled 2021-06-03 (×3): qty 2

## 2021-06-03 NOTE — Hospital Course (Signed)
35 y/o male with previous MV repair in 2019 due to torn chord with subsequent recurrent MR, chronic systolic HF due to biventricular HF with EF 30-35% in 3/22, PAF. Previously saw Dr. Cornelius Moras for potential MVR but failed to f/u. Over last month progressive NYHA IIIB-IV HF symptoms.   Presented to [facility] ER with recurrent HF and evidence of low output and AKI. Transferred to [facility] for further management. Started on empiric milrinone. Due to low mixed venous saturation milrinone increased to 0.5 mcg with modest improvement to 53%. Diuresing with IV lasix. Echo this admission with severe MR of repaired valve, mean gradient 15 mmHg.  Suspect there is a degree of mitral stenosis but some of the gradient is due to high flow across the valve. He would be high risk for MVR given progressive LV dysfunction (likely due to MR), now with EF 20-25%.

## 2021-06-03 NOTE — Progress Notes (Signed)
°  Transition of Care (TOC) Screening Note   Patient Details  Name: Donald [name] Date of Birth: 01-01-1986   Transition of Care [facility]) CM/SW Contact:    Shameek Nyquist, LCSW Phone Number: 06/03/2021, 12:24 PM    Transition of Care Department [facility]) has reviewed patient and no TOC needs have been identified at this time. We will continue to monitor patient advancement through interdisciplinary progression rounds. If new patient transition needs arise, please place a TOC consult. Patient will benefit from PT/OT consult for disposition recommendations.

## 2021-06-03 NOTE — Consult Note (Addendum)
WOC Nurse Consult Note: Reason for Consult: Hidradenitis suppurativa Hidradenitis suppurativa is out of the [facility] nurse scope of practice. It is generally recommended that the MD prescribe medications such as Clindamycin topically or corticosterioids such as Triamcinolone, pain control or a surgical consult if it is that bad. This unfortunately is an inherited condition that will never go away for him.   Hurley's Grade I:  Abscess formation, single or multiple without sinus tracts and cicatrization. Hurley's Grade II:  Recurrent abscesses with tract formation and cicatrization.  Single or multiple, widely separated lesions. Hurley's Grade III:  Diffuse or near-diffuse involvement, or multiple interconnected tracts and abscesses across entire area.  Antibiotic therapy reported in the literature includes:  (Jemec, G. B. E. (2012). Hidradenitis Supporativa.  [place] Journal of Medicine. 366(2) pg. 158-164.) (1) topical administration of clindamycin (10 mg per milliliter twice daily) over three months.   (2) Tetracycline 500 mg twice daily for three months.  (3) Clindaymycin and Rifampin 300 mg twice daily of both, length of time for administration was not indicated.   According to Kaylyn Lim, Judie Petit. B. (2019). Hidradenitis Suppurativa. [employer] Clinical Advisor, e1, pg. [phone]: Topical clindamycin is appropriate for Hurley's Grade I. Cephalosporins, dicloxacillin, erythromycin, minocycline, and tetracycline may also prove helpful for Hurley's Grade II. Severe, recurrent disease can require 3 to 6 months of antibiotics.  Short courses of oral corticosteriods may be helpful in flares, but efficacy is lost after these area discontinued. Zinc salts (zinc gluconate 90 mg TID), and topical metronidazole gel twice daily may also prove helpful. Brooke Dare, A. H. (2012). Hidradenitis Suppurativa.  Journal for Nurse Practitioners, 8(3). Pg. [phone].)  Renaldo Reel. Katrinka Blazing, MSN, RN, CMSRN, Angus Seller, [facility] Pager [phone]

## 2021-06-03 NOTE — Progress Notes (Signed)
Patient ID: Donald [name], male   DOB: [birth year], 35 y.o.   MRN: [id number]     Advanced Heart Failure Rounding Note  PCP-Cardiologist: Dina Rich, MD  [facility]: Dr. 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 Previously saw Dr. Cornelius Moras for potential MVR but failed to f/u. Over last month progressive NYHA IIIB-IV HF symptoms. Presented to [facility] ER with recurrent HF and evidence of low output and AKI. Transferred to [facility] for further management. Started on empiric milrinone.   Subjective:    Remains on milrinone 0.5. Co-ox 53%  Diuresed 6L yesterday. Weight down 3 more pounds (12 pounds total) CVP 18-20.   Feeling better. Denies SOB, orthopnea or PND.     Na 124 K 3.3  Echo: EF 20-25%, mild LV dilation, D-shaped septum, moderately decreased RV systolic function with mild RV enlargement, s/p MV repair with mean gradient 15 mmHg and severe MR, mild TR, dilated IVC.    Objective:   Weight Range: 95.6 kg Body mass index is 31.12 kg/m.   Vital Signs:   Temp:  [98.2 F (36.8 C)-98.5 F (36.9 C)] 98.2 F (36.8 C) (12/19 0759) Pulse Rate:  [48-100] 50 (12/19 0700) Resp:  [20-36] 23 (12/19 0700) BP: (99-138)/(76-112) 125/88 (12/19 0700) SpO2:  [82 %-100 %] 98 % (12/19 0700) Weight:  [95.6 kg] 95.6 kg (12/19 0500) Last BM Date: 06/02/21  Weight change: Filed Weights   05/31/21 0600 06/02/21 0500 06/03/21 0500  Weight: 100.9 kg 96.9 kg 95.6 kg    Intake/Output:   Intake/Output Summary (Last 24 hours) at 06/03/2021 0841 Last data filed at 06/03/2021 0600 Gross per 24 hour  Intake 1775.75 ml  Output 5900 ml  Net -4124.25 ml       Physical Exam    General:  Sitting up in bed No resp difficulty HEENT: normal Neck: supple. JVP to jaw  Carotids 2+ bilat; no bruits. No lymphadenopathy or thryomegaly appreciated. Cor: PMI  nondisplaced. Regular rate & rhythm. 2/6 MR Lungs: clear Abdomen: soft, nontender, nondistended. No hepatosplenomegaly. No bruits or masses. Good bowel sounds. Extremities: no cyanosis, clubbing, rash, 1+ edema Neuro: alert & orientedx3, cranial nerves grossly intact. moves all 4 extremities w/o difficulty. Affect pleasant   Telemetry   Sinus 90s, with frequent PACs and junctional beats. Personally reviewed   Labs    CBC Recent Labs    06/02/21 0314 06/03/21 0315  WBC 9.3 8.6  HGB 12.4* 12.6*  HCT 37.5* 37.0*  MCV 85.6 85.1  PLT 211 221    Basic Metabolic Panel Recent Labs    22/29/79 0314 06/03/21 0315  NA 127* 124*  K 3.2* 3.3*  CL 86* 81*  CO2 32 32  GLUCOSE 207* 253*  BUN 23* 23*  CREATININE 0.95 0.99  CALCIUM 8.6* 9.0  MG 1.5* 1.5*    Liver Function Tests No results for input(s): AST, ALT, ALKPHOS, BILITOT, PROT, ALBUMIN in the last 72 hours.  No results for input(s): LIPASE, AMYLASE in the last 72 hours. Cardiac Enzymes No results for input(s): CKTOTAL, CKMB, CKMBINDEX, TROPONINI in the last 72 hours.  BNP: BNP (last 3 results) Recent Labs    05/09/21 1945 05/23/21 1727 05/29/21 2214  BNP 1,301.0* 766.0* 2,863.0*     ProBNP (last 3 results) No results for input(s): PROBNP in the last 8760 hours.   D-Dimer  No results for input(s): DDIMER in the last 72 hours. Hemoglobin A1C No results for input(s): HGBA1C in the last 72 hours. Fasting Lipid Panel No results for input(s): CHOL, HDL, LDLCALC, TRIG, CHOLHDL, LDLDIRECT in the last 72 hours. Thyroid Function Tests No results for input(s): TSH, T4TOTAL, T3FREE, THYROIDAB in the last 72 hours.  Invalid input(s): FREET3  Other results:   Imaging    No results found.   Medications:     Scheduled Medications:  Chlorhexidine Gluconate Cloth  6 each Topical Daily   digoxin  0.125 mg Oral Daily   feeding supplement  237 mL Oral BID BM   ferrous sulfate  325 mg Oral Daily   furosemide   80 mg Intravenous BID   multivitamin with minerals  1 tablet Oral Daily   potassium chloride SA  40 mEq Oral BID   sodium chloride flush  10-40 mL Intracatheter Q12H   sodium chloride flush  3 mL Intravenous Q12H   spironolactone  25 mg Oral Daily    Infusions:  sodium chloride Stopped (05/31/21 1206)   amiodarone 30 mg/hr (06/03/21 0605)   heparin 1,550 Units/hr (06/03/21 0600)   milrinone 0.5 mcg/kg/min (06/03/21 0714)    PRN Medications: sodium chloride, acetaminophen, sodium chloride flush, sodium chloride flush    Assessment/Plan   1. Acute on Chronic Biventricular Heart Failure>>Cardiogenic Shock  - likely primarily valvular in nature in setting of recurrent severe MR - TEE. 12/21 EF 40 to 45% with severe MR with multiple jets including a possible paravalvular leak. Miderate RV dysfunction severe TR - [facility] 3/22 showed normal coronaries, NICM EF 30-35%, severe MR w/ prominent v-waves in PCWP tracing, mild pulmonary venous HTN with normal CO (Fick cardiac output/index = 5.3/2.5). - Referred for re-do MVR 4/22 but did not follow-up w/ Dr. Cornelius Moras - Now admitted w/cardiogenic shock w/ marked fluid overload, NYHA IV symptoms - Echo EF 20-25%, mild LV dilation, D-shaped septum, moderately decreased RV systolic function with mild RV enlargement, s/p MV repair with mean gradient 15 mmHg and severe MR, mild TR, dilated IVC.  - Milrinone now at 0.5 mcg/kg/min,  co-ox 53%.  He is diuresing well but CVP still high. Continue IV lasix.  - Continue digoxin 0.125 daily.   - Continue spironolactone 25 mg daily.  - Possible ACE/ARNI in near future - Hold b-blocker with shock  - Continue diuresis - Replace K & mag - Will need RHC after further diuresis.  - Suspect primary severe MR with degenerated valve repair, but he has had progressive LV dysfunction with EF down to 20-25%.  Mitral valve replacement would be very high risk.  Will review with TCTS today    2. Severe Mitral Valve  Regurgitation  - s/p MV repair with resection of ruptured anterior papillary muscle and reconstruction of papillary chord and placement of annuloplasty ring in 2019 at [facility] - TEE 12/21 EF 40 to 45% with severe MR with multiple jets including a possible paravalvular leak. Miderate RV dysfunction severe TR - Referred for re-do MV repair 4/22 but never followed up - Now w/ acute decompensated HF/Shock  - Echo this admission with severe MR of repaired valve, mean gradient 15 mmHg.  Suspect there is a degree of mitral stenosis but some of the gradient is due to high flow across the valve.  - Will be high risk for MVR given progressive LV dysfunction (likely due to MR), now with EF 20-25%.  Will need to stabilize/diurese then review options with TCTS as  above. Suspect we may need to touch base with [facility] team - I discussed with patient and his mother today   3. Paroxysmal Atrial Flutter  - several episodes in setting of severe LAE - last DC-CV in 9/22  - Currently sinus tachy with PACs - Monitor w/ Milrinone, now on amiodarone gtt with NSVT - Hold Eliquis. Cover w/ heparin gtt  - Keep K > 4.0 and Mg >2.0  - If redo MVR, should get MAZE    4. 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 AKI  - SCr bumped to 2.07 (baseline <1.0) - cardiorenal/ low output 2/2 severe MR  - improving w/ milrinone, 2.07>>1.79>>1.18>>0.99 today  - inotropic support + lasix above  6. Autoimmune disorder - He has known seronegative spondylitis and pyoderma gangrenosum.  - Previously followed by Rheumatology at [facility].   7. NSVT - Now on amiodarone gtt.  Would continue while on milrinone.   8. Hyponatremia - Hypervolemic hyponatremia.  Fluid restrict 1500 cc.   - will give tolvaptan today  9. Hypomagnesemia - supp  CRITICAL CARE Performed by: Arvilla Meres  Total critical care time: 40 minutes  Critical care time was  exclusive of separately billable procedures and treating other patients.  Critical care was necessary to treat or prevent imminent or life-threatening deterioration.  Critical care was time spent personally by me on the following activities: development of treatment plan with patient and/or surrogate as well as nursing, discussions with consultants, evaluation of patient's response to treatment, examination of patient, obtaining history from patient or surrogate, ordering and performing treatments and interventions, ordering and review of laboratory studies, ordering and review of radiographic studies, pulse oximetry and re-evaluation of patient's condition.  Reuel Boom Kwane Rohl 06/03/2021 8:41 AM

## 2021-06-03 NOTE — Progress Notes (Signed)
ANTICOAGULATION CONSULT NOTE  Pharmacy Consult for heparin Indication: atrial fibrillation  Heparin Dosing Weight: 92 kg  Labs: Recent Labs    06/01/21 0316 06/01/21 1600 06/01/21 1659 06/02/21 0314 06/02/21 1606 06/03/21 0315  HGB 12.4*  --   --  12.4*  --  12.6*  HCT 37.5*  --   --  37.5*  --  37.0*  PLT 188  --   --  211  --  221  APTT 68*  --   --  83*  --  83*  HEPARINUNFRC 0.41  --    < > 0.34 0.34 0.42  CREATININE 1.18 1.11  --  0.95  --  0.99   < > = values in this interval not displayed.     Assessment: 35 yom on apixaban PTA for hx afib (LD 12/15@0918 ) presenting with acute on chronic biventricular heart failure, cardiogenic shock. Pharmacy consulted to transition apixaban to heparin. Heart failure service planning to discuss plans with [facility] for surgery options.   Heparin level 0.4 in range on heparin drip 1550 units/hour. CBC wnl no bleeding noted   Goal of Therapy:  Heparin level 0.3-0.7 units/ml aPTT 66-102 seconds Monitor platelets by anticoagulation protocol: Yes   Plan:  Continue heparin gtt at 1550 units/h Daily HL and CBC while on heparin    Leota Sauers Pharm.D. CPP, BCPS Clinical Pharmacist [phone] 06/03/2021 8:57 AM   Please check AMION for all [facility] Pharmacy numbers 06/03/2021

## 2021-06-04 DIAGNOSIS — I340 Nonrheumatic mitral (valve) insufficiency: Secondary | ICD-10-CM | POA: Diagnosis not present

## 2021-06-04 DIAGNOSIS — N179 Acute kidney failure, unspecified: Secondary | ICD-10-CM | POA: Diagnosis not present

## 2021-06-04 DIAGNOSIS — I5021 Acute systolic (congestive) heart failure: Secondary | ICD-10-CM | POA: Diagnosis not present

## 2021-06-04 LAB — APTT: aPTT: 71 seconds — ABNORMAL HIGH (ref 24–36)

## 2021-06-04 LAB — RESP PANEL BY RT-PCR (FLU A&B, COVID) ARPGX2
Influenza A by PCR: NEGATIVE
Influenza B by PCR: NEGATIVE
SARS Coronavirus 2 by RT PCR: NEGATIVE

## 2021-06-04 LAB — BASIC METABOLIC PANEL
Anion gap: 10 (ref 5–15)
BUN: 22 mg/dL — ABNORMAL HIGH (ref 6–20)
CO2: 33 mmol/L — ABNORMAL HIGH (ref 22–32)
Calcium: 9.3 mg/dL (ref 8.9–10.3)
Chloride: 88 mmol/L — ABNORMAL LOW (ref 98–111)
Creatinine, Ser: 0.94 mg/dL (ref 0.61–1.24)
GFR, Estimated: >60 mL/min (ref >60)
Glucose, Bld: 133 mg/dL — ABNORMAL HIGH (ref 70–99)
Potassium: 3.6 mmol/L (ref 3.5–5.1)
Sodium: 131 mmol/L — ABNORMAL LOW (ref 135–145)

## 2021-06-04 LAB — COOXEMETRY PANEL
Carboxyhemoglobin: 1.5 % (ref 0.5–1.5)
Methemoglobin: 0.7 % (ref 0.0–1.5)
O2 Saturation: 55.7 %
Total hemoglobin: 13.9 g/dL (ref 12.0–16.0)

## 2021-06-04 LAB — CBC
HCT: 41 % (ref 39.0–52.0)
Hemoglobin: 13.5 g/dL (ref 13.0–17.0)
MCH: 28.1 pg (ref 26.0–34.0)
MCHC: 32.9 g/dL (ref 30.0–36.0)
MCV: 85.4 fL (ref 80.0–100.0)
Platelets: 256 10*3/uL (ref 150–400)
RBC: 4.8 MIL/uL (ref 4.22–5.81)
RDW: 14.6 % (ref 11.5–15.5)
WBC: 7.7 10*3/uL (ref 4.0–10.5)
nRBC: 0 % (ref 0.0–0.2)

## 2021-06-04 LAB — MAGNESIUM: Magnesium: 1.8 mg/dL (ref 1.7–2.4)

## 2021-06-04 LAB — HEPARIN LEVEL (UNFRACTIONATED): Heparin Unfractionated: 0.42 IU/mL (ref 0.30–0.70)

## 2021-06-04 MED ORDER — POTASSIUM CHLORIDE CRYS ER 20 MEQ PO TBCR: 60.0000 meq | EXTENDED_RELEASE_TABLET | Freq: Once | ORAL | Status: DC

## 2021-06-04 MED ORDER — HEPARIN (PORCINE) 25000 UT/250ML-% IV SOLN: 1550.0000 [IU]/h | INTRAVENOUS | Status: DC

## 2021-06-04 MED ORDER — POTASSIUM CHLORIDE CRYS ER 20 MEQ PO TBCR
40.0000 meq | EXTENDED_RELEASE_TABLET | Freq: Three times a day (TID) | ORAL | Status: DC
  Administered 2021-06-04: 10:00:00 40 meq via ORAL
  Filled 2021-06-04: qty 2

## 2021-06-04 MED ORDER — MAGNESIUM SULFATE 2 GM/50ML IV SOLN
2.0000 g | Freq: Once | INTRAVENOUS | Status: AC
  Administered 2021-06-04: 10:00:00 2 g via INTRAVENOUS
  Filled 2021-06-04: qty 50

## 2021-06-04 MED ORDER — SPIRONOLACTONE 25 MG PO TABS: 25.0000 mg | ORAL_TABLET | Freq: Every day | ORAL | Status: DC

## 2021-06-04 MED ORDER — FUROSEMIDE 40 MG PO TABS: 40.0000 mg | ORAL_TABLET | Freq: Every day | ORAL | Status: DC

## 2021-06-04 MED ORDER — ACETAMINOPHEN 325 MG PO TABS: 650.0000 mg | ORAL_TABLET | ORAL | Status: DC | PRN

## 2021-06-04 MED ORDER — MILRINONE LACTATE IN DEXTROSE 20-5 MG/100ML-% IV SOLN: 0.5000 ug/kg/min | INTRAVENOUS | Status: DC

## 2021-06-04 MED ORDER — DIGOXIN 125 MCG PO TABS: 0.1250 mg | ORAL_TABLET | Freq: Every day | ORAL | Status: DC

## 2021-06-04 MED ORDER — AMIODARONE HCL IN DEXTROSE 360-4.14 MG/200ML-% IV SOLN: 30.0000 mg/h | INTRAVENOUS | Status: DC

## 2021-06-04 MED ORDER — POTASSIUM CHLORIDE CRYS ER 20 MEQ PO TBCR
20.0000 meq | EXTENDED_RELEASE_TABLET | Freq: Once | ORAL | Status: AC
  Administered 2021-06-04: 13:00:00 20 meq via ORAL
  Filled 2021-06-04: qty 1

## 2021-06-04 NOTE — Progress Notes (Addendum)
Patient ID: Donald [name], male   DOB: 1986-02-21, 35 y.o.   MRN: [id number]     Advanced Heart Failure Rounding Note  PCP-Cardiologist: Dina Rich, MD  [facility]: Dr. 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 Previously saw Dr. Cornelius Moras for potential MVR but failed to f/u. Over last month progressive NYHA IIIB-IV HF symptoms. Presented to [facility] ER with recurrent HF and evidence of low output and AKI. Transferred to [facility] for further management. Started on empiric milrinone.   Subjective:    Remains on milrinone 0.5. Co-ox 56%  Diuresed 3.9  yesterday. Weight down 9 more pounds. CVP down to 8  Feeling better today. Denies SOB.   Echo: EF 20-25%, mild LV dilation, D-shaped septum, moderately decreased RV systolic function with mild RV enlargement, s/p MV repair with mean gradient 15 mmHg and severe MR, mild TR, dilated IVC.    Objective:   Weight Range: 91.2 kg Body mass index is 29.69 kg/m.   Vital Signs:   Temp:  [98 F (36.7 C)-99.8 F (37.7 C)] 98 F (36.7 C) (12/20 0805) Pulse Rate:  [46-60] 50 (12/20 0800) Resp:  [14-41] 24 (12/20 0800) BP: (93-134)/(62-97) 128/77 (12/20 0800) SpO2:  [87 %-100 %] 100 % (12/20 0800) Weight:  [91.2 kg] 91.2 kg (12/20 0600) Last BM Date: 06/02/21  Weight change: Filed Weights   06/02/21 0500 06/03/21 0500 06/04/21 0600  Weight: 96.9 kg 95.6 kg 91.2 kg    Intake/Output:   Intake/Output Summary (Last 24 hours) at 06/04/2021 0942 Last data filed at 06/04/2021 0806 Gross per 24 hour  Intake 2372.67 ml  Output 4850 ml  Net -2477.33 ml      Physical Exam   CVP 8  General:   No resp difficulty HEENT: normal Neck: supple. no JVD. Carotids 2+ bilat; no bruits. No lymphadenopathy or thryomegaly appreciated. Cor: PMI nondisplaced. Regular rate & rhythm. No rubs, gallops . 2/6 MR  . Lungs: clear Abdomen: soft, nontender, nondistended. No hepatosplenomegaly. No bruits or masses. Good bowel sounds. Extremities: no cyanosis, clubbing, rash, edema> LUE PICC  Neuro: alert & orientedx3, cranial nerves grossly intact. moves all 4 extremities w/o difficulty. Affect pleasant  Telemetry   SR 90s with PACs.    Labs    CBC Recent Labs    06/03/21 0315 06/04/21 0604  WBC 8.6 7.7  HGB 12.6* 13.5  HCT 37.0* 41.0  MCV 85.1 85.4  PLT 221 256   Basic Metabolic Panel Recent Labs    70/26/37 0315 06/03/21 2105 06/04/21 0604  NA 124* 129* 131*  K 3.3* 4.1 3.6  CL 81* 86* 88*  CO2 32 34* 33*  GLUCOSE 253* 146* 133*  BUN 23* 24* 22*  CREATININE 0.99 1.12 0.94  CALCIUM 9.0 9.5 9.3  MG 1.5*  --  1.8   Liver Function Tests Recent Labs    06/03/21 1125  AST 48*  ALT 24  ALKPHOS 97  BILITOT 1.1  PROT 7.8  ALBUMIN 2.8*   No results for input(s): LIPASE, AMYLASE in the last 72 hours. Cardiac Enzymes No results for input(s): CKTOTAL, CKMB, CKMBINDEX, TROPONINI in the last 72 hours.  BNP: BNP (last 3 results) Recent Labs    05/09/21 1945 05/23/21 1727 05/29/21 2214  BNP 1,301.0* 766.0* 2,863.0*    ProBNP (last 3 results) No results for input(s): PROBNP  in the last 8760 hours.   D-Dimer No results for input(s): DDIMER in the last 72 hours. Hemoglobin A1C No results for input(s): HGBA1C in the last 72 hours. Fasting Lipid Panel No results for input(s): CHOL, HDL, LDLCALC, TRIG, CHOLHDL, LDLDIRECT in the last 72 hours. Thyroid Function Tests No results for input(s): TSH, T4TOTAL, T3FREE, THYROIDAB in the last 72 hours.  Invalid input(s): FREET3  Other results:   Imaging    No results found.   Medications:     Scheduled Medications:  Chlorhexidine Gluconate Cloth  6 each Topical Daily   digoxin  0.125 mg Oral Daily   feeding supplement  237 mL Oral BID BM   ferrous sulfate  325 mg Oral Daily   furosemide  80 mg Intravenous BID    multivitamin with minerals  1 tablet Oral Daily   sodium chloride flush  10-40 mL Intracatheter Q12H   sodium chloride flush  3 mL Intravenous Q12H   spironolactone  25 mg Oral Daily    Infusions:  sodium chloride Stopped (05/31/21 1206)   amiodarone 30 mg/hr (06/04/21 0800)   heparin 1,550 Units/hr (06/04/21 0800)   milrinone 0.5 mcg/kg/min (06/04/21 0800)    PRN Medications: sodium chloride, acetaminophen, sodium chloride flush, sodium chloride flush    Assessment/Plan   1. Acute on Chronic Biventricular Heart Failure>>Cardiogenic Shock  - likely primarily valvular in nature in setting of recurrent severe MR - TEE. 12/21 EF 40 to 45% with severe MR with multiple jets including a possible paravalvular leak. Miderate RV dysfunction severe TR - [facility] 3/22 showed normal coronaries, NICM EF 30-35%, severe MR w/ prominent v-waves in PCWP tracing, mild pulmonary venous HTN with normal CO (Fick cardiac output/index = 5.3/2.5). - Referred for re-do MVR 4/22 but did not follow-up w/ Dr. Roxy Manns - Now admitted w/cardiogenic shock w/ marked fluid overload, NYHA IV symptoms - Echo EF 20-25%, mild LV dilation, D-shaped septum, moderately decreased RV systolic function with mild RV enlargement, s/p MV repair with mean gradient 15 mmHg and severe MR, mild TR, dilated IVC.  - Milrinone now at 0.5 mcg/kg/min,  co-ox 56%.   - Yesterday had brisk diuresis with IV lasix. CVP down to 8.  - Stop IV lasix. Start po lasix 40 mg daily - Continue digoxin 0.125 daily.   - Continue spironolactone 25 mg daily.  - Possible ACE/ARNI in near future - Hold b-blocker with shock  - Replace K & mag - Suspect primary severe MR with degenerated valve repair, but he has had progressive LV dysfunction with EF down to 20-25%.  Mitral valve replacement would be very high risk.  Will review with TCTS today    2. Severe Mitral Valve Regurgitation  - s/p MV repair (via sternotomy) with resection of ruptured anterior  papillary muscle and reconstruction of papillary chord and placement of annuloplasty ring in 2019 at [facility] - TEE 12/21 EF 40 to 45% with severe MR with multiple jets including a possible paravalvular leak. Miderate RV dysfunction severe TR - Referred for re-do MV repair 4/22 but never followed up - Now w/ acute decompensated HF/Shock  - Echo this admission with severe MR of repaired valve, mean gradient 15 mmHg.  Suspect there is a degree of mitral stenosis but some of the gradient is due to high flow across the valve.  - Will be high risk for MVR given progressive LV dysfunction (likely due to MR), now with EF 20-25%.   - Dr Haroldine Laws discussed with [facility]. Dr Ruby Cola  accepted. Working on transfer.    3. Paroxysmal Atrial Flutter  - several episodes in setting of severe LAE - last DC-CV in 9/22  -Maintaining SR. Marland Kitchen Continue amio drip while on milrinone.   Cover w/ heparin gtt  - Keep K > 4.0 and Mg >2.0  - If redo MVR, should get MAZE    4. 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 AKI  - SCr bumped to 2.07 (baseline <1.0) but normalized with milrinone.  - cardiorenal/ low output 2/2 severe MR  - improved w/ milrinone  6. Autoimmune disorder - He has known seronegative spondylitis and pyoderma gangrenosum.  - Previously followed by Rheumatology at [facility]   7. NSVT - Now on amiodarone gtt.  Would continue while on milrinone.   8. Hyponatremia - Hypervolemic hyponatremia.  Fluid restrict 1500 cc.   - Given tolvaptan 12/19 - Sodium up to 131 today.   9. Hypomagnesemia -Mag 1.8 today. Give 2 grams today.   Transferring to [facility] when bed available.    Amy Clegg NP-C  06/04/2021 9:42 AM  Agree with above.   Remains on milrinone 0.5. feels much better. MV sat 56%. CVP down to 8.   Denies SOB, orthopnea or PND. SCr now normalized.   General:  Sitting up in bed  No resp difficulty HEENT: normal Neck: supple. JVP 7-8 Carotids 2+ bilat;  no bruits. No lymphadenopathy or thryomegaly appreciated. Cor: PMI nondisplaced. Regular rate & rhythm. 2/6 MR Lungs: clear Abdomen: soft, nontender, nondistended. No hepatosplenomegaly. No bruits or masses. Good bowel sounds. Extremities: no cyanosis, clubbing, rash, edema Neuro: alert & orientedx3, cranial nerves grossly intact. moves all 4 extremities w/o difficulty. Affect pleasant  He is improved with high-dose milrinone. Shock resolved. I have reviewed case with TCTS. He will need high-risk MVR -> they have recommended transfer to [facility]. I have spoken with [place] [facility] Surgery team (Drs. Cheree Ditto and Schoeder) as well as the [facility] Service. Will plan transfer to [place] later today. Appreciate their assistance.   Glori Bickers, MD  11:25 AM

## 2021-06-04 NOTE — Plan of Care (Signed)
  Problem: Clinical Measurements: Goal: Diagnostic test results will improve Outcome: Progressing Goal: Respiratory complications will improve Outcome: Progressing Goal: Cardiovascular complication will be avoided Outcome: Progressing   Problem: Activity: Goal: Risk for activity intolerance will decrease Outcome: Progressing   Problem: Pain Managment: Goal: General experience of comfort will improve Outcome: Progressing   

## 2021-06-04 NOTE — Discharge Summary (Addendum)
Advanced Heart Failure Team  Discharge Summary   Patient ID: Donald [name] MRN: [id number], DOB/AGE: Mar 17, 1986 35 y.o. Admit date: 05/29/2021 D/C date:     06/04/2021   Primary Discharge Diagnoses:  A/C Biventricular systolic HF>>>Cardiogenic Shock  Severe Mitral Regurgitation/Moderate mitral stenosis s/p MV ring 2019 [facility]) PAF Pulmonary Hypertension  AKI Autoimmune Disorder/Pyoderma gangrenosum NSVT Hyponatremia  Hyomagnesia   Hospital Course:   Donald [name] is a 35 y.o. male with history of mitral regurgitation (s/p MV repair with resection of ruptured anterior papillary muscle and reconstruction of papillary chord and placement of annuloplasty ring in 2019). 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   The patient has been followed by Dr. Wyline Mood. Patient has had multiple episodes of acute exacerbation of fluid overload in 2020 and 2021 with at least one ER visit for acute pulmonary edema although he did not require hospitalization. Echo 11/21 EF 45% with  severe MR. TEE. 12/21 EF 40 to 45% with severe MR with multiple jets including a possible paravalvular leak, severe TR. The RV was moderate to severely HK with evidence of significant PH He has had several episodes of AFL with RVR, requiring DCCVs.    He saw Dr. Cornelius Moras in 2/22 for consideration of repeat MV surgery and subsequently referred to the [facility] for CHF evaluation/ optimization and RHC prior to potential re-do MVR. Saw Dr. Gala Romney for consultation and set up for [facility] 09/11/20 which showed normal coronaries, NICM EF 30-35%, severe MR w/ prominent v-waves in PCWP tracing, mild pulmonary venous HTN with normal CO (Fick cardiac  output/index = 5.3/2.5).   He was scheduled to f/u w/ Dr. Cornelius Moras in early April to further discuss surgery but he canceled the appt. Appointment was rescheduled but he cacellled again. Was also arranged to undergo dental extractions by Dr. Chales Salmon but canceled as well.  Presented to [facility] ER 05/29/21 with recurrent HF and evidence of low output and AKI (Scr 0.97 ->-> 2.07). Lactic acid 3.5. Echo with EF 20-25% with severe MR/mod MS and severe RV dysfunction.   Transferred to [facility] on 05/30/21 for further management. Started on empiric milrinone.PICC placed. Due to low mixed venous saturation milrinone increased to 0.5 mcg with modest improvement . AKI resolved with addition of milrinone. CVP initially high but improved with IV lasix. Todays CVP was down to 8. Overall diuresed 20 pounds.   Had higher PVC burden while on milrinone so amio drip was started. Also switched to heparin drip. Has not ahd recurrent AF this admission.   Dr Gala Romney reviewed case with TCTS. He will need high-risk MVR -> they have recommended transfer to [facility]. Dr Gala Romney spoke with [facility] Surgery team (Drs. Zebedee Iba and Schoeder) as well as the [facility] Service and was accepted  Will plan transfer to [facility] later today.  Discharge Vitals: Blood pressure (!) 115/95, pulse 73, temperature 98 F (36.7 C), temperature source Oral, resp. rate (!) 28, height 5\' 9"  (1.753 m), weight 91.2 kg, SpO2 100 %.  Labs: Lab Results  Component Value Date   WBC 7.7 06/04/2021   HGB 13.5 06/04/2021   HCT 41.0 06/04/2021   MCV 85.4 06/04/2021   PLT 256 06/04/2021    Recent Labs  Lab 06/03/21 1125 06/03/21 2105 06/04/21  0604  NA  --    < > 131*  K  --    < > 3.6  CL  --    < > 88*  CO2  --    < > 33*  BUN  --    < > 22*  CREATININE  --    < > 0.94  CALCIUM  --    < > 9.3  PROT 7.8  --   --   BILITOT 1.1  --   --   ALKPHOS 97  --   --   ALT 24  --   --   AST 48*  --   --   GLUCOSE  --    < > 133*   < > = values in this  interval not displayed.   No results found for: CHOL, HDL, LDLCALC, TRIG BNP (last 3 results) Recent Labs    05/09/21 1945 05/23/21 1727 05/29/21 2214  BNP 1,301.0* 766.0* 2,863.0*    ProBNP (last 3 results) No results for input(s): PROBNP in the last 8760 hours.   Diagnostic Studies/Procedures   No results found.  Discharge Medications   Allergies as of 06/04/2021   No Known Allergies      Medication List     STOP taking these medications    apixaban 5 MG Tabs tablet Commonly known as: ELIQUIS   Entresto 24-26 MG Generic drug: sacubitril-valsartan   ferrous sulfate 325 (65 FE) MG tablet   metoprolol succinate 50 MG 24 hr tablet Commonly known as: TOPROL-XL   potassium chloride SA 20 MEQ tablet Commonly known as: KLOR-CON M       TAKE these medications    acetaminophen 325 MG tablet Commonly known as: TYLENOL Take 2 tablets (650 mg total) by mouth every 4 (four) hours as needed for headache or mild pain.   amiodarone 360-4.14 MG/200ML-% Soln Commonly known as: NEXTERONE PREMIX Inject 30 mg/hr into the vein continuous.   digoxin 0.125 MG tablet Commonly known as: LANOXIN Take 1 tablet (0.125 mg total) by mouth daily. Start taking on: June 05, 2021   furosemide 40 MG tablet Commonly known as: LASIX Take 1 tablet (40 mg total) by mouth daily. Start taking on: June 05, 2021 What changed:  medication strength how much to take additional instructions Another medication with the same name was removed. Continue taking this medication, and follow the directions you see here.   heparin 25000 UT/250ML infusion Inject 1,550 Units/hr into the vein continuous.   milrinone 20 MG/100 ML Soln infusion Commonly known as: PRIMACOR Inject 0.0502 mg/min into the vein continuous.   spironolactone 25 MG tablet Commonly known as: ALDACTONE Take 1 tablet (25 mg total) by mouth daily. Start taking on: June 05, 2021 What changed: how much to  take        Disposition   The patient will be discharged in stable condition to [facility] Discharge Instructions     Diet - low sodium heart healthy   Complete by: As directed    Increase activity slowly   Complete by: As directed           Duration of Discharge Encounter: Greater than 35 minutes   Signed, Amy Clegg NP-C  06/04/2021, 1:28 PM  Agree with above. I have edited d/c summary with my changes. Please see rounding note from earlier today for further information.   Glori Bickers, MD  2:10 PM

## 2021-06-04 NOTE — Progress Notes (Signed)
ANTICOAGULATION CONSULT NOTE  Pharmacy Consult for heparin Indication: atrial fibrillation  Heparin Dosing Weight: 92 kg  Labs: Recent Labs    06/02/21 0314 06/02/21 1606 06/03/21 0315 06/03/21 2105 06/04/21 0604  HGB 12.4*  --  12.6*  --  13.5  HCT 37.5*  --  37.0*  --  41.0  PLT 211  --  221  --  256  APTT 83*  --  83*  --  71*  HEPARINUNFRC 0.34 0.34 0.42  --  0.42  CREATININE 0.95  --  0.99 1.12 0.94     Assessment: 35 yom on apixaban PTA for hx afib (LD 12/15@0918 ) presenting with acute on chronic biventricular heart failure, cardiogenic shock. Pharmacy consulted to transition apixaban to heparin. Heart failure service planning to discuss plans with [facility] for surgery options.   Heparin level 0.42 in range on heparin drip 1550 units/hour. CBC wnl no bleeding noted   Goal of Therapy:  Heparin level 0.3-0.7 units/ml aPTT 66-102 seconds Monitor platelets by anticoagulation protocol: Yes   Plan:  Continue heparin gtt at 1550 units/h Daily HL and CBC while on heparin Monitor s/s bleeding Replace K and Mag   Leota Sauers Pharm.D. CPP, BCPS Clinical Pharmacist [phone] 06/04/2021 9:46 AM   Please check AMION for all [facility] Pharmacy numbers 06/04/2021

## 2021-06-04 NOTE — TOC Initial Note (Signed)
Transition of Care [facility]) - Initial/Assessment Note    Patient Details  Name: Donald [name] MRN: [id number] Date of Birth: 1986-04-03  Transition of [facility] CM/SW Contact:    Elliot Cousin, RN Phone Number: [phone] 06/04/2021, 3:44 PM  Clinical Narrative:                  HF TOC CM spoke to pt at bedside. Lives at home with his father. He was working prior to hospitalization but feels he may not go back. Pt scheduled for transport to [facility].    Expected Discharge Plan: Acute to Acute Transfer Barriers to Discharge: No Barriers Identified   Patient Goals and CMS Choice        Expected Discharge Plan and Services Expected Discharge Plan: Acute to Acute Transfer In-house Referral: Clinical Social Work Discharge Planning Services: CM Consult   Living arrangements for the past 2 months: Single Family Home Expected Discharge Date: 06/04/21                                    Prior Living Arrangements/Services Living arrangements for the past 2 months: Single Family Home Lives with:: Parents Patient language and need for interpreter reviewed:: Yes        Need for Family Participation in Patient Care: Yes (Comment) Care giver support system in place?: Yes (comment)   Criminal Activity/Legal Involvement Pertinent to Current Situation/Hospitalization: No - Comment as needed  Activities of Daily Living Home Assistive Devices/Equipment: None ADL Screening (condition at time of admission) Patient's cognitive ability adequate to safely complete daily activities?: No Is the patient deaf or have difficulty hearing?: No Does the patient have difficulty seeing, even when wearing glasses/contacts?: No Does the patient have difficulty concentrating, remembering, or making decisions?: No Patient able to express need for assistance with ADLs?: No Does the patient have difficulty dressing or bathing?: No Independently performs ADLs?: Yes (appropriate for  developmental age) Does the patient have difficulty walking or climbing stairs?: No Weakness of Legs: None Weakness of Arms/Hands: None  Permission Sought/Granted Permission sought to share information with : Case Manager, Magazine features editor, Family Supports Permission granted to share information with : Yes, Verbal Permission Granted  Share Information with NAME: Jalil, Lorusso  Permission granted to share info w AGENCY: [facility]  Permission granted to share info w Relationship: father  Permission granted to share info w Contact Information: [phone]  Emotional Assessment Appearance:: Appears stated age Attitude/Demeanor/Rapport: Engaged Affect (typically observed): Accepting Orientation: : Oriented to Self, Oriented to Place, Oriented to  Time, Oriented to Situation   Psych Involvement: No (comment)  Admission diagnosis:  Acute exacerbation of CHF (congestive heart failure) (HCC) [I50.9] Acute systolic CHF (congestive heart failure) (HCC) [I50.21] Acute on chronic systolic and diastolic heart failure, NYHA class 3 (HCC) [I50.43] Patient Active Problem List   Diagnosis Date Noted   Acute exacerbation of CHF (congestive heart failure) (HCC) 05/30/2021   Acute respiratory failure with hypoxia (HCC) 05/30/2021   Hypokalemia 05/30/2021   Elevated brain natriuretic peptide (BNP) level 05/30/2021   Elevated troponin 05/30/2021   AKI (acute kidney injury) (HCC) 05/30/2021   Hypoalbuminemia due to protein-calorie malnutrition (HCC) 05/30/2021   Prolonged QT interval 05/30/2021   Elevated AST (SGOT) 05/30/2021   Paroxysmal atrial flutter (HCC) 05/30/2021   Acute systolic CHF (congestive heart failure) (HCC) 05/30/2021   Acute on chronic systolic and diastolic heart failure,  NYHA class 3 (HCC) 05/30/2021   Caries    Chronic periodontitis    Retained dental root    Tricuspid regurgitation    Mitral regurgitation    Chronic systolic heart failure (HCC)    Autoimmune  disorder (HCC)    Seronegative spondylitis (HCC)    PCP:  Patient, No Pcp Per (Inactive) Pharmacy:   [facility] [address] [place] - [address] [place] [place] [address] Phone: [phone] Fax: [phone]     Social Determinants of Health (SDOH) Interventions    Readmission Risk Interventions No flowsheet data found.

## 2021-06-13 ENCOUNTER — Telehealth: Payer: Self-pay | Admitting: Cardiology

## 2021-06-13 NOTE — Telephone Encounter (Signed)
Morrie Sheldon, NP with [facility] states the patient is at their office today for mechanical mitral valve. She would like for [place] to order and coordinate scheduling a first-time INR follow-up with our [facility]. She states they are planning on discharging him on 06/16/21-06/17/21 and they are hopeful he can have his INR taken sometime next week. Please call patient to arrange and return call to [facility] to confirm appointment/discuss any concerns or questions.

## 2021-06-13 NOTE — Telephone Encounter (Signed)
Called and spoke with NP at [facility].  Pt S/P MVR  Goal 2.5 - 3.5.  Started warfarin yesterday (5mg ).  INR today 1.4.  Will get 5mg  again tonight.  Made appt for pt to have INR check here post discharge.  Appt. 06/20/21 at 10am.  She will tell patient.

## 2021-06-20 ENCOUNTER — Ambulatory Visit (INDEPENDENT_AMBULATORY_CARE_PROVIDER_SITE_OTHER): Payer: Medicaid Other | Admitting: *Deleted

## 2021-06-20 DIAGNOSIS — Z5181 Encounter for therapeutic drug level monitoring: Secondary | ICD-10-CM

## 2021-06-20 DIAGNOSIS — Z952 Presence of prosthetic heart valve: Secondary | ICD-10-CM | POA: Diagnosis not present

## 2021-06-20 LAB — POCT INR: INR: 2.4 (ref 2.0–3.0)

## 2021-06-20 NOTE — Patient Instructions (Signed)
Has been taking warfarin 2mg  since D/C from [facility] on 1/3 instead of 3mg . Start taking warfarin 1 1/2 tablets daily Recheck on 06/25/20

## 2021-06-23 HISTORY — PX: MITRAL VALVE REPLACEMENT: SHX147

## 2021-06-25 ENCOUNTER — Ambulatory Visit (INDEPENDENT_AMBULATORY_CARE_PROVIDER_SITE_OTHER): Payer: Medicaid Other | Admitting: *Deleted

## 2021-06-25 DIAGNOSIS — Z5181 Encounter for therapeutic drug level monitoring: Secondary | ICD-10-CM | POA: Diagnosis not present

## 2021-06-25 DIAGNOSIS — Z952 Presence of prosthetic heart valve: Secondary | ICD-10-CM | POA: Diagnosis not present

## 2021-06-25 LAB — POCT INR: INR: 2 (ref 2.0–3.0)

## 2021-06-25 NOTE — Patient Instructions (Signed)
Take warfarin 2 tablets tonight then increase dose to 2 tablets daily except 1 1/2 tablets on Sundays, Tuesdays and Thursdays Recheck on 07/01/21

## 2021-07-01 ENCOUNTER — Other Ambulatory Visit: Payer: Self-pay

## 2021-07-01 ENCOUNTER — Ambulatory Visit (INDEPENDENT_AMBULATORY_CARE_PROVIDER_SITE_OTHER): Payer: Medicaid Other | Admitting: *Deleted

## 2021-07-01 DIAGNOSIS — Z952 Presence of prosthetic heart valve: Secondary | ICD-10-CM

## 2021-07-01 DIAGNOSIS — Z5181 Encounter for therapeutic drug level monitoring: Secondary | ICD-10-CM | POA: Diagnosis not present

## 2021-07-01 LAB — POCT INR: INR: 2.4 (ref 2.0–3.0)

## 2021-07-01 NOTE — Patient Instructions (Signed)
Increase warfarin to 2 tablets daily except 1 1/2 tablets on Sundays Recheck on 07/08/21

## 2021-07-03 DIAGNOSIS — Z48812 Encounter for surgical aftercare following surgery on the circulatory system: Secondary | ICD-10-CM | POA: Diagnosis not present

## 2021-07-03 DIAGNOSIS — Z952 Presence of prosthetic heart valve: Secondary | ICD-10-CM | POA: Diagnosis not present

## 2021-07-03 DIAGNOSIS — R600 Localized edema: Secondary | ICD-10-CM | POA: Diagnosis not present

## 2021-07-03 DIAGNOSIS — R9431 Abnormal electrocardiogram [ECG] [EKG]: Secondary | ICD-10-CM | POA: Diagnosis not present

## 2021-07-08 ENCOUNTER — Other Ambulatory Visit: Payer: Self-pay

## 2021-07-08 ENCOUNTER — Ambulatory Visit (INDEPENDENT_AMBULATORY_CARE_PROVIDER_SITE_OTHER): Payer: Medicaid Other | Admitting: *Deleted

## 2021-07-08 DIAGNOSIS — Z5181 Encounter for therapeutic drug level monitoring: Secondary | ICD-10-CM | POA: Diagnosis not present

## 2021-07-08 DIAGNOSIS — Z952 Presence of prosthetic heart valve: Secondary | ICD-10-CM

## 2021-07-08 LAB — POCT INR: INR: 4.1 — AB (ref 2.0–3.0)

## 2021-07-08 NOTE — Patient Instructions (Addendum)
Hold warfarin tonight then decrease dose to 1 1/2 tablets daily except 2 tablets on Mondays, Wednesdays and Fridays Recheck in 10 days

## 2021-07-10 DIAGNOSIS — Z952 Presence of prosthetic heart valve: Secondary | ICD-10-CM | POA: Diagnosis not present

## 2021-07-10 DIAGNOSIS — Z79899 Other long term (current) drug therapy: Secondary | ICD-10-CM | POA: Diagnosis not present

## 2021-07-10 DIAGNOSIS — Z48812 Encounter for surgical aftercare following surgery on the circulatory system: Secondary | ICD-10-CM | POA: Diagnosis not present

## 2021-07-16 ENCOUNTER — Telehealth: Payer: Self-pay | Admitting: Cardiology

## 2021-07-16 MED ORDER — WARFARIN SODIUM 2 MG PO TABS: ORAL_TABLET | ORAL | 3 refills | Status: DC

## 2021-07-16 NOTE — Telephone Encounter (Signed)
Rx for warfarin sent in. 

## 2021-07-16 NOTE — Telephone Encounter (Signed)
° °  Pt c/o medication issue:  1. Name of Medication: coumadin  2. How are you currently taking this medication (dosage and times per day)?   3. Are you having a reaction (difficulty breathing--STAT)?   4. What is your medication issue? Pt said he needs 30 days suppky refill send to his pharmacy, prescription is not on pt's meds list

## 2021-07-17 ENCOUNTER — Other Ambulatory Visit: Payer: Self-pay

## 2021-07-17 ENCOUNTER — Emergency Department (HOSPITAL_COMMUNITY): Payer: Medicaid Other

## 2021-07-17 ENCOUNTER — Encounter (HOSPITAL_COMMUNITY): Payer: Self-pay | Admitting: *Deleted

## 2021-07-17 ENCOUNTER — Emergency Department (HOSPITAL_COMMUNITY)
Admission: EM | Admit: 2021-07-17 | Discharge: 2021-07-17 | Disposition: A | Discharge status: Home / Self Care | Payer: Medicaid Other | Attending: Emergency Medicine | Admitting: Emergency Medicine

## 2021-07-17 DIAGNOSIS — R0989 Other specified symptoms and signs involving the circulatory and respiratory systems: Secondary | ICD-10-CM | POA: Insufficient documentation

## 2021-07-17 DIAGNOSIS — Z79899 Other long term (current) drug therapy: Secondary | ICD-10-CM | POA: Diagnosis not present

## 2021-07-17 DIAGNOSIS — I110 Hypertensive heart disease with heart failure: Secondary | ICD-10-CM | POA: Diagnosis not present

## 2021-07-17 DIAGNOSIS — M109 Gout, unspecified: Secondary | ICD-10-CM | POA: Diagnosis not present

## 2021-07-17 DIAGNOSIS — Z7901 Long term (current) use of anticoagulants: Secondary | ICD-10-CM | POA: Diagnosis not present

## 2021-07-17 DIAGNOSIS — I5020 Unspecified systolic (congestive) heart failure: Secondary | ICD-10-CM | POA: Diagnosis not present

## 2021-07-17 DIAGNOSIS — R0602 Shortness of breath: Secondary | ICD-10-CM | POA: Diagnosis present

## 2021-07-17 LAB — COMPREHENSIVE METABOLIC PANEL
ALT: 15 U/L (ref 0–44)
AST: 35 U/L (ref 15–41)
Albumin: 2.9 g/dL — ABNORMAL LOW (ref 3.5–5.0)
Alkaline Phosphatase: 111 U/L (ref 38–126)
Anion gap: 8 (ref 5–15)
BUN: 14 mg/dL (ref 6–20)
CO2: 22 mmol/L (ref 22–32)
Calcium: 8.9 mg/dL (ref 8.9–10.3)
Chloride: 101 mmol/L (ref 98–111)
Creatinine, Ser: 1.02 mg/dL (ref 0.61–1.24)
GFR, Estimated: >60 mL/min (ref >60)
Glucose, Bld: 128 mg/dL — ABNORMAL HIGH (ref 70–99)
Potassium: 3.7 mmol/L (ref 3.5–5.1)
Sodium: 131 mmol/L — ABNORMAL LOW (ref 135–145)
Total Bilirubin: 0.7 mg/dL (ref 0.3–1.2)
Total Protein: 9.8 g/dL — ABNORMAL HIGH (ref 6.5–8.1)

## 2021-07-17 LAB — RAPID URINE DRUG SCREEN, HOSP PERFORMED
Amphetamines: NOT DETECTED
Barbiturates: NOT DETECTED
Benzodiazepines: NOT DETECTED
Cocaine: NOT DETECTED
Opiates: NOT DETECTED
Tetrahydrocannabinol: NOT DETECTED

## 2021-07-17 LAB — CBC WITH DIFFERENTIAL/PLATELET
Abs Immature Granulocytes: 0.02 10*3/uL (ref 0.00–0.07)
Basophils Absolute: 0.1 10*3/uL (ref 0.0–0.1)
Basophils Relative: 1 %
Eosinophils Absolute: 0.1 10*3/uL (ref 0.0–0.5)
Eosinophils Relative: 1 %
HCT: 32.9 % — ABNORMAL LOW (ref 39.0–52.0)
Hemoglobin: 10.2 g/dL — ABNORMAL LOW (ref 13.0–17.0)
Immature Granulocytes: 0 %
Lymphocytes Relative: 35 %
Lymphs Abs: 2.3 10*3/uL (ref 0.7–4.0)
MCH: 26.2 pg (ref 26.0–34.0)
MCHC: 31 g/dL (ref 30.0–36.0)
MCV: 84.6 fL (ref 80.0–100.0)
Monocytes Absolute: 0.2 10*3/uL (ref 0.1–1.0)
Monocytes Relative: 4 %
Neutro Abs: 3.9 10*3/uL (ref 1.7–7.7)
Neutrophils Relative %: 59 %
Platelets: 417 10*3/uL — ABNORMAL HIGH (ref 150–400)
RBC: 3.89 MIL/uL — ABNORMAL LOW (ref 4.22–5.81)
RDW: 15.6 % — ABNORMAL HIGH (ref 11.5–15.5)
WBC: 6.6 10*3/uL (ref 4.0–10.5)
nRBC: 0 % (ref 0.0–0.2)

## 2021-07-17 LAB — MAGNESIUM: Magnesium: 1.5 mg/dL — ABNORMAL LOW (ref 1.7–2.4)

## 2021-07-17 LAB — URINALYSIS, MICROSCOPIC (REFLEX)

## 2021-07-17 LAB — PROTIME-INR
INR: 2.7 — ABNORMAL HIGH (ref 0.8–1.2)
Prothrombin Time: 28.6 seconds — ABNORMAL HIGH (ref 11.4–15.2)

## 2021-07-17 LAB — URINALYSIS, ROUTINE W REFLEX MICROSCOPIC
Bilirubin Urine: NEGATIVE
Glucose, UA: NEGATIVE mg/dL
Ketones, ur: NEGATIVE mg/dL
Leukocytes,Ua: NEGATIVE
Nitrite: NEGATIVE
Specific Gravity, Urine: 1.02 (ref 1.005–1.030)
pH: 5.5 (ref 5.0–8.0)

## 2021-07-17 LAB — TROPONIN I (HIGH SENSITIVITY)
Troponin I (High Sensitivity): 33 ng/L — ABNORMAL HIGH (ref <18)
Troponin I (High Sensitivity): 31 ng/L — ABNORMAL HIGH (ref <18)

## 2021-07-17 LAB — BRAIN NATRIURETIC PEPTIDE: B Natriuretic Peptide: 1686 pg/mL — ABNORMAL HIGH (ref 0.0–100.0)

## 2021-07-17 MED ORDER — PREDNISONE 10 MG PO TABS: ORAL_TABLET | ORAL | 0 refills | Status: DC

## 2021-07-17 MED ORDER — POTASSIUM CHLORIDE CRYS ER 20 MEQ PO TBCR
20.0000 meq | EXTENDED_RELEASE_TABLET | Freq: Once | ORAL | Status: AC
  Administered 2021-07-17: 20 meq via ORAL
  Filled 2021-07-17: qty 1

## 2021-07-17 MED ORDER — MAGNESIUM SULFATE 2 GM/50ML IV SOLN
2.0000 g | Freq: Once | INTRAVENOUS | Status: AC
  Administered 2021-07-17: 2 g via INTRAVENOUS
  Filled 2021-07-17: qty 50

## 2021-07-17 MED ORDER — MAGNESIUM OXIDE -MG SUPPLEMENT 200 MG PO TABS: 1.0000 | ORAL_TABLET | Freq: Two times a day (BID) | ORAL | 0 refills | Status: DC

## 2021-07-17 MED ORDER — OXYCODONE-ACETAMINOPHEN 5-325 MG PO TABS
1.0000 | ORAL_TABLET | Freq: Once | ORAL | Status: AC
  Administered 2021-07-17: 1 via ORAL
  Filled 2021-07-17: qty 1

## 2021-07-17 MED ORDER — ACETAMINOPHEN 500 MG PO TABS: 1000.0000 mg | ORAL_TABLET | Freq: Once | ORAL | Status: DC

## 2021-07-17 MED ORDER — PREDNISONE 10 MG PO TABS
60.0000 mg | ORAL_TABLET | Freq: Once | ORAL | Status: AC
Start: 2021-07-17 — End: 2021-07-17
  Administered 2021-07-17: 60 mg via ORAL
  Filled 2021-07-17: qty 1

## 2021-07-17 MED ORDER — FUROSEMIDE 10 MG/ML IJ SOLN
80.0000 mg | Freq: Once | INTRAMUSCULAR | Status: AC
  Administered 2021-07-17: 80 mg via INTRAVENOUS
  Filled 2021-07-17: qty 8

## 2021-07-17 MED ORDER — OXYCODONE-ACETAMINOPHEN 5-325 MG PO TABS
1.0000 | ORAL_TABLET | ORAL | 0 refills | Status: DC | PRN
Start: 2021-07-17 — End: 2021-08-30

## 2021-07-17 NOTE — ED Provider Notes (Signed)
4:15 PM-Case discussed with Dr. Harl Bowie, by me at 4:20 PM his cardiologist reviewed the records with me, regarding patient's status.  Patient with mild fluid overload, presenting with shortness of breath x3 days.  He recently had mitral valve replacement.  We discussed the case findings and recommendations.  We will give him a dose of Lasix, check his urine output and see how he does.  Patient can potentially be discharged with short-term follow-up with cardiology, versus admitted for monitoring.  5:45 PM-at this time the patient is alert and comfortable.  Oxygen saturation 100% on room air.  Patient states he has urinated since receiving Lasix IV.  An additional complaint to me, at this time, is right ankle pain and swelling.  He denies trauma.  He states the ankles been hurting since he was discharged from the hospital following his recent mitral valve surgery.  Patient has family history of gout.  He is with his uncle at this time.  Patient's right ankle is swollen tender, warm and appears red.  He guards against movement of it.  Ankle is clinically consistent with gout flare at this time.  Lungs at this time I decreased air movement bilaterally without wheezes, rales or rhonchi.  Patient states he has an ex-smoker.  He is coughing but not producing sputum at this time.  Patient feels well enough to go home.  He plans on following up with cardiology for scheduled appointment in the heart failure clinic.  We discussed low magnesium treatment.  We will also treat for gout with prednisone, and pain with narcotic analgesic.  Also given prescription for magnesium, to improve for magnesium regulation.   Daleen Bo, MD 07/17/21 1755

## 2021-07-17 NOTE — ED Triage Notes (Signed)
Pt in c/o SOB onset x 3 days denies cough, pt reports bil leg pain, pt reports recent open heart sx for mitral valve replacement at [facility], since then you have been doing well, but started have SOB x 3 days ago, pt reports compliance with medications, A&O x4

## 2021-07-17 NOTE — ED Provider Notes (Signed)
[facility] EMERGENCY DEPARTMENT Provider Note   CSN: [id number] Arrival date & time: 07/17/21  1237     History  Chief Complaint  Patient presents with   Leg Pain   Shortness of Breath    Donald [name] is a 36 y.o. male.   Leg Pain Shortness of Breath Patient presenting for exertional shortness of breath as well as right leg pain.  He has a history of biventricular systolic heart failure, paroxysmal atrial fibrillation, pulmonary hypertension.  He underwent a mitral valve replacement 1 month ago.  He is followed by [facility], Dr. Wyline Mood.  He reports that he has had worsening exertional shortness of breath.  Currently, he is prescribed Lasix, which he has been taking 40 mg twice daily.  He is on warfarin, which he also continues to take.  Since his surgery he reports that he has had improvement in his bilateral lower extremity edema.  With his previous swelling, he did have pain in the areas of his ankles.  Currently, he has continued pain in his right ankle.  He reports that the pain has not worsened but it has not got better, as he expected it would with the improved swelling.  He reports that his exercise tolerance was improved following surgery and has recently worsened.  He denies any other symptoms currently or over the past several days.    Home Medications Prior to Admission medications   Medication Sig Start Date End Date Taking? Authorizing Provider  acetaminophen (TYLENOL) 325 MG tablet Take 2 tablets (650 mg total) by mouth every 4 (four) hours as needed for headache or mild pain. 06/04/21  Yes Clegg, Amy D, NP  furosemide (LASIX) 40 MG tablet Take 1 tablet (40 mg total) by mouth daily. 06/05/21  Yes Clegg, Amy D, NP  Magnesium Oxide 200 MG TABS Take 1 tablet (200 mg total) by mouth 2 (two) times daily. 07/17/21  Yes Mancel Bale, MD  metoprolol succinate (TOPROL-XL) 50 MG 24 hr tablet Take 50 mg by mouth daily. 07/10/21  Yes [provider]  oxyCODONE-acetaminophen  (PERCOCET) 5-325 MG tablet Take 1 tablet by mouth every 4 (four) hours as needed for severe pain. 07/17/21  Yes Mancel Bale, MD  predniSONE (DELTASONE) 10 MG tablet Take q day 6,5,4,3,2,1 07/17/21  Yes Mancel Bale, MD  spironolactone (ALDACTONE) 25 MG tablet Take 1 tablet (25 mg total) by mouth daily. Patient taking differently: Take 12.5 mg by mouth daily. 06/05/21  Yes Clegg, Amy D, NP  warfarin (COUMADIN) 2 MG tablet Take 1 1/2 tablets daily except 2 tablets on Mondays, Wednesdays and Fridays or as directed 07/16/21  Yes Branch, Dorothe Pea, MD  amiodarone (NEXTERONE PREMIX) 360-4.14 MG/200ML-% SOLN Inject 30 mg/hr into the vein continuous. Patient not taking: Reported on 07/17/2021 06/04/21   Tonye Becket D, NP  digoxin (LANOXIN) 0.125 MG tablet Take 1 tablet (0.125 mg total) by mouth daily. Patient not taking: Reported on 07/17/2021 06/05/21   Tonye Becket D, NP  heparin 34742 UT/250ML infusion Inject 1,550 Units/hr into the vein continuous. Patient not taking: Reported on 07/17/2021 06/04/21   Tonye Becket D, NP  milrinone (PRIMACOR) 20 MG/100 ML SOLN infusion Inject 0.0502 mg/min into the vein continuous. Patient not taking: Reported on 07/17/2021 06/04/21   Sherald Hess, NP      Allergies    Patient has no known allergies.    Review of Systems   Review of Systems  Respiratory:  Positive for shortness of breath.   Musculoskeletal:  Positive for arthralgias.  All other systems reviewed and are negative.  Physical Exam Updated Vital Signs BP 120/85    Pulse 95    Temp 98.2 F (36.8 C) (Oral)    Resp 18    Ht 5\' 9"  (1.753 m)    Wt 90.9 kg    SpO2 100%    BMI 29.59 kg/m  Physical Exam Vitals and nursing note reviewed.  Constitutional:      General: He is not in acute distress.    Appearance: He is well-developed. He is not ill-appearing, toxic-appearing or diaphoretic.  HENT:     Head: Normocephalic and atraumatic.     Mouth/Throat:     Mouth: Mucous membranes are moist.     Pharynx:  Oropharynx is clear.  Eyes:     Conjunctiva/sclera: Conjunctivae normal.  Cardiovascular:     Rate and Rhythm: Normal rate and regular rhythm.     Heart sounds: No murmur heard. Pulmonary:     Effort: Pulmonary effort is normal. No tachypnea, accessory muscle usage or respiratory distress.     Breath sounds: Normal breath sounds. No decreased breath sounds, wheezing or rales.  Abdominal:     Palpations: Abdomen is soft.     Tenderness: There is no abdominal tenderness.  Musculoskeletal:        General: No swelling. Normal range of motion.     Cervical back: Normal range of motion and neck supple.     Right lower leg: Tenderness present.  Skin:    General: Skin is warm and dry.     Capillary Refill: Capillary refill takes less than 2 seconds.     Coloration: Skin is not cyanotic or pale.  Neurological:     General: No focal deficit present.     Mental Status: He is alert and oriented to person, place, and time.     Cranial Nerves: No cranial nerve deficit.     Motor: No weakness.  Psychiatric:        Mood and Affect: Mood normal.        Behavior: Behavior normal.    ED Results / Procedures / Treatments   Labs (all labs ordered are listed, but only abnormal results are displayed) Labs Reviewed  COMPREHENSIVE METABOLIC PANEL - Abnormal; Notable for the following components:      Result Value   Sodium 131 (*)    Glucose, Bld 128 (*)    Total Protein 9.8 (*)    Albumin 2.9 (*)    All other components within normal limits  MAGNESIUM - Abnormal; Notable for the following components:   Magnesium 1.5 (*)    All other components within normal limits  BRAIN NATRIURETIC PEPTIDE - Abnormal; Notable for the following components:   B Natriuretic Peptide 1,686.0 (*)    All other components within normal limits  CBC WITH DIFFERENTIAL/PLATELET - Abnormal; Notable for the following components:   RBC 3.89 (*)    Hemoglobin 10.2 (*)    HCT 32.9 (*)    RDW 15.6 (*)    Platelets 417 (*)     All other components within normal limits  PROTIME-INR - Abnormal; Notable for the following components:   Prothrombin Time 28.6 (*)    INR 2.7 (*)    All other components within normal limits  URINALYSIS, ROUTINE W REFLEX MICROSCOPIC - Abnormal; Notable for the following components:   APPearance HAZY (*)    Hgb urine dipstick TRACE (*)    Protein, ur TRACE (*)  All other components within normal limits  URINALYSIS, MICROSCOPIC (REFLEX) - Abnormal; Notable for the following components:   Bacteria, UA FEW (*)    All other components within normal limits  TROPONIN I (HIGH SENSITIVITY) - Abnormal; Notable for the following components:   Troponin I (High Sensitivity) 33 (*)    All other components within normal limits  TROPONIN I (HIGH SENSITIVITY) - Abnormal; Notable for the following components:   Troponin I (High Sensitivity) 31 (*)    All other components within normal limits  RAPID URINE DRUG SCREEN, HOSP PERFORMED    EKG EKG Interpretation  Date/Time:  Wednesday July 17 2021 12:46:51 EST Ventricular Rate:  97 PR Interval:  160 QRS Duration: 117 QT Interval:  403 QTC Calculation: 512 R Axis:   80 Text Interpretation: Sinus rhythm Nonspecific intraventricular conduction delay Abnormal lateral Q waves Borderline ST depression, lateral leads Confirmed by Gloris Manchester [phone]) on 07/17/2021 1:47:28 PM  Radiology No results found.  Procedures Procedures    Medications Ordered in ED Medications  magnesium sulfate IVPB 2 g 50 mL (0 g Intravenous Stopped 07/17/21 1727)  potassium chloride SA (KLOR-CON M) CR tablet 20 mEq (20 mEq Oral Given 07/17/21 1445)  furosemide (LASIX) injection 80 mg (80 mg Intravenous Given 07/17/21 1636)  predniSONE (DELTASONE) tablet 60 mg (60 mg Oral Given 07/17/21 1751)  oxyCODONE-acetaminophen (PERCOCET/ROXICET) 5-325 MG per tablet 1 tablet (1 tablet Oral Given 07/17/21 1751)    ED Course/ Medical Decision Making/ A&P                            Medical Decision Making Amount and/or Complexity of Data Reviewed Labs: ordered. Radiology: ordered.  Risk OTC drugs. Prescription drug management.   This patient presents to the ED for concern of exertional shortness of breath, this involves an extensive number of treatment options, and is a complaint that carries with it a high risk of complications and morbidity.  The differential diagnosis includes CHF exacerbation, reactive airway disease, pneumonia, PE, acute anemia   Co morbidities that complicate the patient evaluation  biventricular systolic heart failure, paroxysmal atrial fibrillation, pulmonary hypertension, mitral valve replacement   Additional history obtained:  Additional history obtained from patient's cousin External records from outside source obtained and reviewed including EMR   Lab Tests:  I Ordered, and personally interpreted labs.  The pertinent results include: Hemoglobin 10.2 seems to be 2 g lower than baseline, although this could be from blood loss during his recent surgery; hypomagnesemia replaced in the ED; elevated BNP (baseline difficult to ascertain given his recent surgery) baseline elevated troponin; therapeutic INR   Imaging Studies ordered:  I ordered imaging studies including chest x-ray, right ankle x-ray I independently visualized and interpreted imaging which showed baseline cardiomegaly with some vascular congestion on chest x-ray; ankle x-ray without acute findings I agree with the radiologist interpretation   Cardiac Monitoring:  The patient was maintained on a cardiac monitor.  I personally viewed and interpreted the cardiac monitored which showed an underlying rhythm of: Sinus rhythm  Consultations Obtained:  I requested consultation with the cardiologist,  and discussed lab and imaging findings as well as pertinent plan - they recommend: Call back pending at time of signout   Problem List / ED Course:  This patient is a  36 year old male with history of heart failure and recent mitral valve replacement, presenting for exertional shortness of breath.  On his arrival in the ED, he  has normal vital signs, including normal SPO2 on room air.  His breathing does not appear labored at rest.  His lungs are clear to auscultation.  Laboratory work-up was initiated.  Patient also has complaints of pain that is located in the area just proximal to his right ankle.  He reports that he has had ongoing pain that was associated with BLE edema.  His edema has improved although his pain remains.  Area of ankle is not warm or erythematous.  It is tender.  X-ray imaging was obtained which did not show any acute findings.  Patient's lab work was notable for an elevated BNP.  He has had elevated BNP's in the past.  Some of these were before his recent mitral valve repair.  Given his recent surgery, baseline elevated BNP is difficult to ascertain.  His magnesium was low and this was replaced in the ED.  Notably, he does not have a leukocytosis which further speaks against any type of infectious cause of his ankle pain.  Currently, he is on the 40 mg twice daily dosing of Lasix.  He may benefit from medication optimization.  I did walk him in the emergency department.  He walked approximately 30 feet and did not experience any observable shortness of breath.  Consult order for cardiology was requested to obtain recommendations on possible medication changes versus admission for diuresis versus close follow-up.  At time of signout, callback from cardiology was pending.  Care of patient was signed out to oncoming ED provider.   Reevaluation:  After the interventions noted above, I reevaluated the patient and found that they have :stayed the same   Social Determinants of Health:  Chronic medical conditions at young age   Dispostion:  After consideration of the diagnostic results and the patients response to treatment, I feel that the patent  would benefit from reassessment following cardiology recommendations.          Final Clinical Impression(s) / ED Diagnoses Final diagnoses:  Pulmonary vascular congestion  Shortness of breath  Acute gout of right ankle, unspecified cause  Hypomagnesemia    Rx / DC Orders ED Discharge Orders          Ordered    oxyCODONE-acetaminophen (PERCOCET) 5-325 MG tablet  Every 4 hours PRN        07/17/21 1753    predniSONE (DELTASONE) 10 MG tablet        07/17/21 1753    Magnesium Oxide 200 MG TABS  2 times daily        07/17/21 1753              Gloris Manchester, MD 07/19/21 1726

## 2021-07-17 NOTE — Discharge Instructions (Addendum)
Continue taking your Lasix and stay on a low-salt diet to prevent fluid accumulation.  Your right ankle appears to be inflamed by gout.  We are treating you with prednisone.  This may help your shortness of breath as well.  I suspect that you have some mild bronchitis, associated with your prior history of smoking.  Prednisone can help decrease inflammation from that.  Watch for signs of fever and change in color of your sputum.  Follow-up with the heart failure clinic as scheduled.  We sent prescriptions to your pharmacy to start taking to help your problems.  Do not drive when taking the oxycodone, narcotic pain reliever.  Return here if your condition worsens.

## 2021-07-18 ENCOUNTER — Ambulatory Visit (INDEPENDENT_AMBULATORY_CARE_PROVIDER_SITE_OTHER): Payer: Medicaid Other | Admitting: *Deleted

## 2021-07-18 DIAGNOSIS — Z5181 Encounter for therapeutic drug level monitoring: Secondary | ICD-10-CM

## 2021-07-18 DIAGNOSIS — Z952 Presence of prosthetic heart valve: Secondary | ICD-10-CM | POA: Diagnosis not present

## 2021-07-18 LAB — POCT INR: INR: 3.4 — AB (ref 2.0–3.0)

## 2021-07-18 NOTE — Patient Instructions (Signed)
Continue warfarin 1 1/2 tablets daily except 2 tablets on Mondays, Wednesdays and Fridays On prednisone 10mg  (6,5,4,3,2,1) for gout in foot Eat extra greens/salads while on above Recheck in 10 days

## 2021-07-22 ENCOUNTER — Emergency Department (HOSPITAL_COMMUNITY)
Admission: EM | Admit: 2021-07-22 | Discharge: 2021-07-22 | Disposition: A | Discharge status: Home / Self Care | Payer: Medicaid Other | Attending: Emergency Medicine | Admitting: Emergency Medicine

## 2021-07-22 ENCOUNTER — Emergency Department (HOSPITAL_COMMUNITY): Payer: Medicaid Other

## 2021-07-22 ENCOUNTER — Encounter (HOSPITAL_COMMUNITY): Payer: Self-pay

## 2021-07-22 ENCOUNTER — Other Ambulatory Visit: Payer: Self-pay

## 2021-07-22 DIAGNOSIS — I5023 Acute on chronic systolic (congestive) heart failure: Secondary | ICD-10-CM | POA: Diagnosis not present

## 2021-07-22 DIAGNOSIS — J9600 Acute respiratory failure, unspecified whether with hypoxia or hypercapnia: Secondary | ICD-10-CM | POA: Diagnosis not present

## 2021-07-22 DIAGNOSIS — R2243 Localized swelling, mass and lump, lower limb, bilateral: Secondary | ICD-10-CM | POA: Insufficient documentation

## 2021-07-22 DIAGNOSIS — R0602 Shortness of breath: Secondary | ICD-10-CM | POA: Insufficient documentation

## 2021-07-22 DIAGNOSIS — I4891 Unspecified atrial fibrillation: Secondary | ICD-10-CM | POA: Diagnosis not present

## 2021-07-22 DIAGNOSIS — Z7902 Long term (current) use of antithrombotics/antiplatelets: Secondary | ICD-10-CM | POA: Insufficient documentation

## 2021-07-22 DIAGNOSIS — R0600 Dyspnea, unspecified: Secondary | ICD-10-CM

## 2021-07-22 DIAGNOSIS — Z79899 Other long term (current) drug therapy: Secondary | ICD-10-CM | POA: Diagnosis not present

## 2021-07-22 DIAGNOSIS — R978 Other abnormal tumor markers: Secondary | ICD-10-CM | POA: Diagnosis not present

## 2021-07-22 DIAGNOSIS — I480 Paroxysmal atrial fibrillation: Secondary | ICD-10-CM | POA: Diagnosis not present

## 2021-07-22 DIAGNOSIS — Z20822 Contact with and (suspected) exposure to covid-19: Secondary | ICD-10-CM | POA: Diagnosis not present

## 2021-07-22 DIAGNOSIS — R0682 Tachypnea, not elsewhere classified: Secondary | ICD-10-CM | POA: Insufficient documentation

## 2021-07-22 DIAGNOSIS — Z87891 Personal history of nicotine dependence: Secondary | ICD-10-CM | POA: Diagnosis not present

## 2021-07-22 DIAGNOSIS — I5022 Chronic systolic (congestive) heart failure: Secondary | ICD-10-CM | POA: Diagnosis not present

## 2021-07-22 DIAGNOSIS — E162 Hypoglycemia, unspecified: Secondary | ICD-10-CM | POA: Diagnosis not present

## 2021-07-22 DIAGNOSIS — R140 Abdominal distension (gaseous): Secondary | ICD-10-CM | POA: Insufficient documentation

## 2021-07-22 LAB — COMPREHENSIVE METABOLIC PANEL
ALT: 17 U/L (ref 0–44)
AST: 40 U/L (ref 15–41)
Albumin: 3.1 g/dL — ABNORMAL LOW (ref 3.5–5.0)
Alkaline Phosphatase: 104 U/L (ref 38–126)
Anion gap: 10 (ref 5–15)
BUN: 22 mg/dL — ABNORMAL HIGH (ref 6–20)
CO2: 22 mmol/L (ref 22–32)
Calcium: 8.5 mg/dL — ABNORMAL LOW (ref 8.9–10.3)
Chloride: 100 mmol/L (ref 98–111)
Creatinine, Ser: 0.95 mg/dL (ref 0.61–1.24)
GFR, Estimated: >60 mL/min (ref >60)
Glucose, Bld: 95 mg/dL (ref 70–99)
Potassium: 3.3 mmol/L — ABNORMAL LOW (ref 3.5–5.1)
Sodium: 132 mmol/L — ABNORMAL LOW (ref 135–145)
Total Bilirubin: 0.9 mg/dL (ref 0.3–1.2)
Total Protein: 8.9 g/dL — ABNORMAL HIGH (ref 6.5–8.1)

## 2021-07-22 LAB — CBC WITH DIFFERENTIAL/PLATELET
Abs Immature Granulocytes: 0.02 10*3/uL (ref 0.00–0.07)
Basophils Absolute: 0 10*3/uL (ref 0.0–0.1)
Basophils Relative: 0 %
Eosinophils Absolute: 0 10*3/uL (ref 0.0–0.5)
Eosinophils Relative: 0 %
HCT: 31.5 % — ABNORMAL LOW (ref 39.0–52.0)
Hemoglobin: 9.8 g/dL — ABNORMAL LOW (ref 13.0–17.0)
Immature Granulocytes: 0 %
Lymphocytes Relative: 42 %
Lymphs Abs: 2.9 10*3/uL (ref 0.7–4.0)
MCH: 25.3 pg — ABNORMAL LOW (ref 26.0–34.0)
MCHC: 31.1 g/dL (ref 30.0–36.0)
MCV: 81.4 fL (ref 80.0–100.0)
Monocytes Absolute: 0.4 10*3/uL (ref 0.1–1.0)
Monocytes Relative: 6 %
Neutro Abs: 3.5 10*3/uL (ref 1.7–7.7)
Neutrophils Relative %: 52 %
Platelets: 420 10*3/uL — ABNORMAL HIGH (ref 150–400)
RBC: 3.87 MIL/uL — ABNORMAL LOW (ref 4.22–5.81)
RDW: 15.5 % (ref 11.5–15.5)
WBC: 6.9 10*3/uL (ref 4.0–10.5)
nRBC: 1.3 % — ABNORMAL HIGH (ref 0.0–0.2)

## 2021-07-22 LAB — BRAIN NATRIURETIC PEPTIDE: B Natriuretic Peptide: 2708 pg/mL — ABNORMAL HIGH (ref 0.0–100.0)

## 2021-07-22 LAB — TROPONIN I (HIGH SENSITIVITY)
Troponin I (High Sensitivity): 37 ng/L — ABNORMAL HIGH (ref <18)
Troponin I (High Sensitivity): 41 ng/L — ABNORMAL HIGH (ref <18)

## 2021-07-22 LAB — LIPASE, BLOOD: Lipase: 31 U/L (ref 11–51)

## 2021-07-22 LAB — DIGOXIN LEVEL: Digoxin Level: <0.2 ng/mL — ABNORMAL LOW (ref 0.8–2.0)

## 2021-07-22 MED ORDER — FUROSEMIDE 10 MG/ML IJ SOLN
60.0000 mg | Freq: Once | INTRAMUSCULAR | Status: AC
  Administered 2021-07-22: 60 mg via INTRAVENOUS
  Filled 2021-07-22: qty 6

## 2021-07-22 NOTE — Discharge Instructions (Addendum)
Please double your lasix dose starting tonight (Monday). Skip the morning dose today only.  Tuesday through Friday, please take double your dose on both doses. Resume normal dosing over the weekend. Please keep your appointment with Dr. Gala Romney on Monday and follow recommendations from him.  Return here for any new fluid overload, worsening shortness of breath, chest pain or other issues.

## 2021-07-22 NOTE — ED Provider Notes (Signed)
Emergency Department Provider Note  I have reviewed the triage vital signs and the nursing notes.  HISTORY  Chief Complaint Shortness of Breath   HPI Donald [name] is a 36 y.o. male with with multiple medical problems as documented below who presents to the emergency department today with shortness of breath.  Patient had aortic valve replacement a couple weeks ago at [facility].  He has been seen a couple times since then because of dyspnea.  Patient states he is compliant with his medications at home but the Lasix does not seem to make him urinate very well.  No fever or productive cough.  Minimal lower extremity swelling or abdominal distention just feels short of breath.  Has some chest pressure associate with a shortness of breath.  No chest pain.  PMH Past Medical History:  Diagnosis Date   Anemia    Autoimmune disorder (HCC)    pyoderma gangrenosum   CHF (congestive heart failure) (HCC)    Chronic systolic heart failure (HCC)    a. EF 35-40% by echo in 07/2018 b. EF at 45% by repeat echo in 04/2020   DVT (deep venous thrombosis) (HCC)    h/o   Dysrhythmia    Mitral regurgitation    a. 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 b. 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HCC)    arthritis   Tricuspid regurgitation     Home Medications Prior to Admission medications   Medication Sig Start Date End Date Taking? Authorizing Provider  acetaminophen (TYLENOL) 325 MG tablet Take 2 tablets (650 mg total) by mouth every 4 (four) hours as needed for headache or mild pain. 06/04/21   Clegg, Amy D, NP  furosemide (LASIX) 40 MG tablet Take 1 tablet (40 mg total) by mouth daily. 06/05/21   Clegg, Amy D, NP  Magnesium Oxide 200 MG TABS Take 1 tablet (200 mg  total) by mouth 2 (two) times daily. 07/17/21   Mancel Bale, MD  metoprolol succinate (TOPROL-XL) 50 MG 24 hr tablet Take 50 mg by mouth daily. 07/10/21   [provider]  oxyCODONE-acetaminophen (PERCOCET) 5-325 MG tablet Take 1 tablet by mouth every 4 (four) hours as needed for severe pain. 07/17/21   Mancel Bale, MD  predniSONE (DELTASONE) 10 MG tablet Take q day 6,5,4,3,2,1 07/17/21   Mancel Bale, MD  spironolactone (ALDACTONE) 25 MG tablet Take 1 tablet (25 mg total) by mouth daily. Patient taking differently: Take 12.5 mg by mouth daily. 06/05/21   Tonye Becket D, NP  warfarin (COUMADIN) 2 MG tablet Take 1 1/2 tablets daily except 2 tablets on Mondays, Wednesdays and Fridays or as directed 07/16/21   [name], MD    Social History Social History   Tobacco Use   Smoking status: Former    Types: Cigarettes    Quit date: 10/14/2020    Years since quitting: 0.7   Smokeless tobacco: Never  Vaping Use   Vaping Use: Never used  Substance Use Topics   Alcohol use: No   Drug use: No    Review of Systems: Documented in HPI ____________________________________________  PHYSICAL EXAM: VITAL SIGNS: ED Triage Vitals  Enc Vitals Group     BP 07/22/21 0230 (!) 136/97     Pulse Rate  07/22/21 0230 (!) 52     Resp 07/22/21 0230 17     Temp --      Temp src --      SpO2 07/22/21 0205 100 %     Weight 07/22/21 0206 214 lb 12.8 oz (97.4 kg)     Height 07/22/21 0206 5\' 9"  (1.753 m)   Physical Exam Vitals and nursing note reviewed.  Constitutional:      Appearance: He is well-developed.  HENT:     Head: Normocephalic and atraumatic.  Cardiovascular:     Rate and Rhythm: Normal rate.  Pulmonary:     Effort: Pulmonary effort is normal. Tachypnea present. No respiratory distress.     Breath sounds: Rales (mild) present.  Abdominal:     General: There is no distension.  Musculoskeletal:        General: Normal range of motion.     Cervical back: Normal range of  motion.     Right lower leg: Edema present.     Left lower leg: Edema present.  Skin:    General: Skin is warm and dry.     Comments: Well healing scar to chest  Neurological:     General: No focal deficit present.     Mental Status: He is alert.      ____________________________________________   LABS (all labs ordered are listed, but only abnormal results are displayed)  Labs Reviewed  CBC WITH DIFFERENTIAL/PLATELET - Abnormal; Notable for the following components:      Result Value   RBC 3.87 (*)    Hemoglobin 9.8 (*)    HCT 31.5 (*)    MCH 25.3 (*)    Platelets 420 (*)    nRBC 1.3 (*)    All other components within normal limits  BRAIN NATRIURETIC PEPTIDE - Abnormal; Notable for the following components:   B Natriuretic Peptide 2,708.0 (*)    All other components within normal limits  COMPREHENSIVE METABOLIC PANEL - Abnormal; Notable for the following components:   Sodium 132 (*)    Potassium 3.3 (*)    BUN 22 (*)    Calcium 8.5 (*)    Total Protein 8.9 (*)    Albumin 3.1 (*)    All other components within normal limits  DIGOXIN LEVEL - Abnormal; Notable for the following components:   Digoxin Level <0.2 (*)    All other components within normal limits  TROPONIN I (HIGH SENSITIVITY) - Abnormal; Notable for the following components:   Troponin I (High Sensitivity) 37 (*)    All other components within normal limits  TROPONIN I (HIGH SENSITIVITY) - Abnormal; Notable for the following components:   Troponin I (High Sensitivity) 41 (*)    All other components within normal limits  LIPASE, BLOOD   ____________________________________________  EKG   EKG Interpretation  Date/Time:  Monday July 22 2021 02:06:38 EST Ventricular Rate:  97 PR Interval:  101 QRS Duration: 120 QT Interval:  426 QTC Calculation: 519 R Axis:   74 Text Interpretation: Sinus rhythm Ventricular premature complex Short PR interval Probable left atrial enlargement Nonspecific  intraventricular conduction delay Abnormal lateral Q waves Nonspecific repol abnormality, diffuse leads Confirmed by Merrily Pew [phone]) on 07/22/2021 3:30:11 AM        ____________________________________________  RADIOLOGY  No results found. ____________________________________________  PROCEDURES  Procedure(s) performed:   Procedures ____________________________________________  INITIAL IMPRESSION / ASSESSMENT AND PLAN   This patient presents to the ED for concern of shortness of breath, this involves  an extensive number of treatment options, and is a complaint that carries with it a high risk of complications and morbidity.  The differential diagnosis includes chf exacerbation, pneumonia, pneumothorax.   Additional history obtained:  Additional history obtained from noone Previous records obtained and reviewed in Epic  Co morbidities that complicate the patient evaluation  Aortic valve replacement Chf  Social Determinants of Health:  N/a  ED Course  Images ordered viewed and obtained by myself. Agree with Radiology interpretation. Details in ED course.  Labs ordered reviewed by myself as detailed in ED course.  Consultations obtained/considered detailed in ED course.   Clinical Course as of 07/23/21 [id number]  [facility] Jul 22, 2021  0328 DG Chest Portable 1 View [JM]    Clinical Course User Index [JM] Kamauri Kathol, Corene Cornea, MD      Cardiac Monitoring:  The patient was maintained on a cardiac monitor.  I personally viewed and interpreted the cardiac monitored which showed an underlying rhythm of: NSR  CRITICAL INTERVENTIONS:  IV lasix  Reevaluation:  After the interventions noted above, I reevaluated the patient and found that they have :improved  Patient's BNP was elevated.  I gave him hefty dose of IV Lasix here with good urinary output.  I discussed doubling his Lasix at home for the next few days and then following up with Dr. Nani Ravens in the office on Monday as  scheduled.  Return here if any new or worsening symptoms or if he does not seem to be improving.  FINAL IMPRESSION AND PLAN Final diagnoses:  Dyspnea, unspecified type   A medical screening exam was performed and I feel the patient has had an appropriate workup for their chief complaint at this time and likelihood of emergent condition existing is low. They have been counseled on decision, DISCHARGE, follow up and which symptoms necessitate immediate return to the emergency department. They or their family verbally stated understanding and agreement with plan and discharged in stable condition.   ____________________________________________   NEW OUTPATIENT MEDICATIONS STARTED DURING THIS VISIT:  Discharge Medication List as of 07/22/2021  6:02 AM      Note:  This note was prepared with assistance of Dragon voice recognition software. Occasional wrong-word or sound-a-like substitutions may have occurred due to the inherent limitations of voice recognition software.    Avanna Sowder, Corene Cornea, MD 07/23/21 [phone]

## 2021-07-22 NOTE — ED Triage Notes (Signed)
Pt arrived from home with c/o SOB states that it feels hard to catch a breath and had recent mitral valve replacement 3 weeks ago.

## 2021-07-23 DIAGNOSIS — E162 Hypoglycemia, unspecified: Secondary | ICD-10-CM | POA: Diagnosis not present

## 2021-07-23 DIAGNOSIS — I4891 Unspecified atrial fibrillation: Secondary | ICD-10-CM | POA: Diagnosis not present

## 2021-07-23 DIAGNOSIS — J9600 Acute respiratory failure, unspecified whether with hypoxia or hypercapnia: Secondary | ICD-10-CM | POA: Diagnosis not present

## 2021-07-23 DIAGNOSIS — R000 Tachycardia, unspecified: Secondary | ICD-10-CM | POA: Diagnosis not present

## 2021-07-23 DIAGNOSIS — R0789 Other chest pain: Secondary | ICD-10-CM | POA: Diagnosis not present

## 2021-07-23 DIAGNOSIS — R079 Chest pain, unspecified: Secondary | ICD-10-CM | POA: Diagnosis not present

## 2021-07-23 DIAGNOSIS — I5023 Acute on chronic systolic (congestive) heart failure: Secondary | ICD-10-CM | POA: Diagnosis not present

## 2021-07-23 DIAGNOSIS — R0600 Dyspnea, unspecified: Secondary | ICD-10-CM | POA: Diagnosis not present

## 2021-07-23 DIAGNOSIS — Z20822 Contact with and (suspected) exposure to covid-19: Secondary | ICD-10-CM | POA: Diagnosis not present

## 2021-07-24 ENCOUNTER — Inpatient Hospital Stay (HOSPITAL_COMMUNITY): Payer: Medicaid Other

## 2021-07-24 ENCOUNTER — Inpatient Hospital Stay (HOSPITAL_COMMUNITY)
Admission: EM | Admit: 2021-07-24 | Discharge: 2021-08-03 | DRG: 291 | Disposition: A | Discharge status: Home / Self Care | Payer: Medicaid Other | Source: Other Acute Inpatient Hospital | Attending: Internal Medicine | Admitting: Internal Medicine

## 2021-07-24 ENCOUNTER — Encounter (HOSPITAL_COMMUNITY): Payer: Self-pay | Admitting: *Deleted

## 2021-07-24 DIAGNOSIS — D509 Iron deficiency anemia, unspecified: Secondary | ICD-10-CM

## 2021-07-24 DIAGNOSIS — I4891 Unspecified atrial fibrillation: Secondary | ICD-10-CM

## 2021-07-24 DIAGNOSIS — R0600 Dyspnea, unspecified: Secondary | ICD-10-CM

## 2021-07-24 DIAGNOSIS — E876 Hypokalemia: Secondary | ICD-10-CM | POA: Diagnosis not present

## 2021-07-24 DIAGNOSIS — S31000A Unspecified open wound of lower back and pelvis without penetration into retroperitoneum, initial encounter: Secondary | ICD-10-CM

## 2021-07-24 DIAGNOSIS — I5023 Acute on chronic systolic (congestive) heart failure: Principal | ICD-10-CM

## 2021-07-24 DIAGNOSIS — Z86718 Personal history of other venous thrombosis and embolism: Secondary | ICD-10-CM | POA: Diagnosis not present

## 2021-07-24 DIAGNOSIS — I272 Pulmonary hypertension, unspecified: Secondary | ICD-10-CM | POA: Diagnosis present

## 2021-07-24 DIAGNOSIS — R0609 Other forms of dyspnea: Secondary | ICD-10-CM

## 2021-07-24 DIAGNOSIS — E162 Hypoglycemia, unspecified: Secondary | ICD-10-CM | POA: Diagnosis present

## 2021-07-24 DIAGNOSIS — I252 Old myocardial infarction: Secondary | ICD-10-CM

## 2021-07-24 DIAGNOSIS — R319 Hematuria, unspecified: Secondary | ICD-10-CM | POA: Diagnosis not present

## 2021-07-24 DIAGNOSIS — M4690 Unspecified inflammatory spondylopathy, site unspecified: Secondary | ICD-10-CM | POA: Diagnosis present

## 2021-07-24 DIAGNOSIS — Z7952 Long term (current) use of systemic steroids: Secondary | ICD-10-CM

## 2021-07-24 DIAGNOSIS — Z79899 Other long term (current) drug therapy: Secondary | ICD-10-CM

## 2021-07-24 DIAGNOSIS — I428 Other cardiomyopathies: Secondary | ICD-10-CM | POA: Diagnosis not present

## 2021-07-24 DIAGNOSIS — K922 Gastrointestinal hemorrhage, unspecified: Secondary | ICD-10-CM | POA: Diagnosis not present

## 2021-07-24 DIAGNOSIS — J9601 Acute respiratory failure with hypoxia: Secondary | ICD-10-CM | POA: Diagnosis present

## 2021-07-24 DIAGNOSIS — Z7901 Long term (current) use of anticoagulants: Secondary | ICD-10-CM

## 2021-07-24 DIAGNOSIS — Z452 Encounter for adjustment and management of vascular access device: Secondary | ICD-10-CM

## 2021-07-24 DIAGNOSIS — I4892 Unspecified atrial flutter: Secondary | ICD-10-CM

## 2021-07-24 DIAGNOSIS — R570 Cardiogenic shock: Secondary | ICD-10-CM

## 2021-07-24 DIAGNOSIS — E872 Acidosis, unspecified: Secondary | ICD-10-CM | POA: Diagnosis present

## 2021-07-24 DIAGNOSIS — I480 Paroxysmal atrial fibrillation: Secondary | ICD-10-CM | POA: Diagnosis not present

## 2021-07-24 DIAGNOSIS — K7200 Acute and subacute hepatic failure without coma: Secondary | ICD-10-CM

## 2021-07-24 DIAGNOSIS — Z952 Presence of prosthetic heart valve: Principal | ICD-10-CM

## 2021-07-24 DIAGNOSIS — N179 Acute kidney failure, unspecified: Secondary | ICD-10-CM | POA: Diagnosis not present

## 2021-07-24 DIAGNOSIS — R7989 Other specified abnormal findings of blood chemistry: Secondary | ICD-10-CM | POA: Diagnosis present

## 2021-07-24 DIAGNOSIS — I5021 Acute systolic (congestive) heart failure: Secondary | ICD-10-CM

## 2021-07-24 DIAGNOSIS — G9341 Metabolic encephalopathy: Secondary | ICD-10-CM | POA: Diagnosis present

## 2021-07-24 DIAGNOSIS — I5082 Biventricular heart failure: Secondary | ICD-10-CM | POA: Diagnosis present

## 2021-07-24 DIAGNOSIS — L732 Hidradenitis suppurativa: Secondary | ICD-10-CM | POA: Diagnosis present

## 2021-07-24 DIAGNOSIS — E871 Hypo-osmolality and hyponatremia: Secondary | ICD-10-CM

## 2021-07-24 DIAGNOSIS — Z20822 Contact with and (suspected) exposure to covid-19: Secondary | ICD-10-CM | POA: Diagnosis not present

## 2021-07-24 DIAGNOSIS — Z87891 Personal history of nicotine dependence: Secondary | ICD-10-CM

## 2021-07-24 DIAGNOSIS — L88 Pyoderma gangrenosum: Secondary | ICD-10-CM | POA: Diagnosis not present

## 2021-07-24 LAB — HEPATIC FUNCTION PANEL
ALT: 450 U/L — ABNORMAL HIGH (ref 0–44)
AST: 969 U/L — ABNORMAL HIGH (ref 15–41)
Albumin: 2.6 g/dL — ABNORMAL LOW (ref 3.5–5.0)
Alkaline Phosphatase: 107 U/L (ref 38–126)
Bilirubin, Direct: 0.8 mg/dL — ABNORMAL HIGH (ref 0.0–0.2)
Indirect Bilirubin: 0.8 mg/dL (ref 0.3–0.9)
Total Bilirubin: 1.6 mg/dL — ABNORMAL HIGH (ref 0.3–1.2)
Total Protein: 8.1 g/dL (ref 6.5–8.1)

## 2021-07-24 LAB — LACTIC ACID, PLASMA: Lactic Acid, Venous: 5.4 mmol/L (ref 0.5–1.9)

## 2021-07-24 LAB — ECHOCARDIOGRAM COMPLETE
AR max vel: 4.03 cm2
AV Area VTI: 3.14 cm2
AV Area mean vel: 4.06 cm2
AV Mean grad: 2 mmHg
AV Peak grad: 3.1 mmHg
Ao pk vel: 0.88 m/s
Area-P 1/2: 5.88 cm2
Height: 69 in
MV VTI: 1.82 cm2
P 1/2 time: 816 msec
S' Lateral: 6.2 cm
Single Plane A4C EF: 8.2 %

## 2021-07-24 LAB — TYPE AND SCREEN
ABO/RH(D): B POS
Antibody Screen: NEGATIVE

## 2021-07-24 LAB — PROTIME-INR
INR: 9.6 (ref 0.8–1.2)
INR: 9.8 (ref 0.8–1.2)
Prothrombin Time: 77.2 seconds — ABNORMAL HIGH (ref 11.4–15.2)
Prothrombin Time: 78.7 seconds — ABNORMAL HIGH (ref 11.4–15.2)

## 2021-07-24 LAB — CBC
HCT: 31.1 % — ABNORMAL LOW (ref 39.0–52.0)
Hemoglobin: 10.1 g/dL — ABNORMAL LOW (ref 13.0–17.0)
MCH: 25.8 pg — ABNORMAL LOW (ref 26.0–34.0)
MCHC: 32.5 g/dL (ref 30.0–36.0)
MCV: 79.5 fL — ABNORMAL LOW (ref 80.0–100.0)
Platelets: 362 10*3/uL (ref 150–400)
RBC: 3.91 MIL/uL — ABNORMAL LOW (ref 4.22–5.81)
RDW: 15.3 % (ref 11.5–15.5)
WBC: 13.3 10*3/uL — ABNORMAL HIGH (ref 4.0–10.5)
nRBC: 2 % — ABNORMAL HIGH (ref 0.0–0.2)

## 2021-07-24 LAB — COOXEMETRY PANEL
Carboxyhemoglobin: 0.9 % (ref 0.5–1.5)
Carboxyhemoglobin: 1.6 % — ABNORMAL HIGH (ref 0.5–1.5)
Methemoglobin: 1 % (ref 0.0–1.5)
Methemoglobin: 1 % (ref 0.0–1.5)
O2 Saturation: 53.8 %
O2 Saturation: 73.8 %
Total hemoglobin: 10 g/dL — ABNORMAL LOW (ref 12.0–16.0)
Total hemoglobin: 8.7 g/dL — ABNORMAL LOW (ref 12.0–16.0)

## 2021-07-24 LAB — BASIC METABOLIC PANEL
Anion gap: 16 — ABNORMAL HIGH (ref 5–15)
BUN: 38 mg/dL — ABNORMAL HIGH (ref 6–20)
CO2: 25 mmol/L (ref 22–32)
Calcium: 8.5 mg/dL — ABNORMAL LOW (ref 8.9–10.3)
Chloride: 93 mmol/L — ABNORMAL LOW (ref 98–111)
Creatinine, Ser: 1.89 mg/dL — ABNORMAL HIGH (ref 0.61–1.24)
GFR, Estimated: 47 mL/min — ABNORMAL LOW (ref >60)
Glucose, Bld: 118 mg/dL — ABNORMAL HIGH (ref 70–99)
Potassium: 4.2 mmol/L (ref 3.5–5.1)
Sodium: 134 mmol/L — ABNORMAL LOW (ref 135–145)

## 2021-07-24 LAB — RESP PANEL BY RT-PCR (FLU A&B, COVID) ARPGX2
Influenza A by PCR: NEGATIVE
Influenza B by PCR: NEGATIVE
SARS Coronavirus 2 by RT PCR: NEGATIVE

## 2021-07-24 LAB — POCT I-STAT 7, (LYTES, BLD GAS, ICA,H+H)
Acid-base deficit: 3 mmol/L — ABNORMAL HIGH (ref 0.0–2.0)
Bicarbonate: 21.2 mmol/L (ref 20.0–28.0)
Calcium, Ion: 1.07 mmol/L — ABNORMAL LOW (ref 1.15–1.40)
HCT: 35 % — ABNORMAL LOW (ref 39.0–52.0)
Hemoglobin: 11.9 g/dL — ABNORMAL LOW (ref 13.0–17.0)
O2 Saturation: 100 %
Potassium: 4.3 mmol/L (ref 3.5–5.1)
Sodium: 135 mmol/L (ref 135–145)
TCO2: 22 mmol/L (ref 22–32)
pCO2 arterial: 33 mmHg (ref 32.0–48.0)
pH, Arterial: 7.415 (ref 7.350–7.450)
pO2, Arterial: 200 mmHg — ABNORMAL HIGH (ref 83.0–108.0)

## 2021-07-24 LAB — GLUCOSE, CAPILLARY
Glucose-Capillary: 155 mg/dL — ABNORMAL HIGH (ref 70–99)
Glucose-Capillary: 122 mg/dL — ABNORMAL HIGH (ref 70–99)
Glucose-Capillary: 119 mg/dL — ABNORMAL HIGH (ref 70–99)
Glucose-Capillary: 108 mg/dL — ABNORMAL HIGH (ref 70–99)
Glucose-Capillary: 100 mg/dL — ABNORMAL HIGH (ref 70–99)

## 2021-07-24 LAB — RAPID URINE DRUG SCREEN, HOSP PERFORMED
Amphetamines: NOT DETECTED
Barbiturates: NOT DETECTED
Benzodiazepines: POSITIVE — AB
Cocaine: NOT DETECTED
Opiates: NOT DETECTED
Tetrahydrocannabinol: NOT DETECTED

## 2021-07-24 LAB — HEMOGLOBIN A1C
Hgb A1c MFr Bld: 6.4 % — ABNORMAL HIGH (ref 4.8–5.6)
Mean Plasma Glucose: 136.98 mg/dL

## 2021-07-24 LAB — STREP PNEUMONIAE URINARY ANTIGEN: Strep Pneumo Urinary Antigen: NEGATIVE

## 2021-07-24 LAB — MAGNESIUM: Magnesium: 1.9 mg/dL (ref 1.7–2.4)

## 2021-07-24 LAB — PHOSPHORUS: Phosphorus: 5.3 mg/dL — ABNORMAL HIGH (ref 2.5–4.6)

## 2021-07-24 LAB — OCCULT BLOOD GASTRIC / DUODENUM (SPECIMEN CUP): Occult Blood, Gastric: POSITIVE — AB

## 2021-07-24 LAB — ABO/RH: ABO/RH(D): B POS

## 2021-07-24 LAB — BRAIN NATRIURETIC PEPTIDE: B Natriuretic Peptide: 3184.1 pg/mL — ABNORMAL HIGH (ref 0.0–100.0)

## 2021-07-24 LAB — MRSA NEXT GEN BY PCR, NASAL: MRSA by PCR Next Gen: NOT DETECTED

## 2021-07-24 MED ORDER — FENTANYL BOLUS VIA INFUSION
50.0000 ug | INTRAVENOUS | Status: DC | PRN
  Filled 2021-07-24: qty 100

## 2021-07-24 MED ORDER — GERHARDT'S BUTT CREAM
1.0000 "application " | TOPICAL_CREAM | CUTANEOUS | Status: DC | PRN
  Filled 2021-07-24: qty 1

## 2021-07-24 MED ORDER — GERHARDT'S BUTT CREAM
1.0000 "application " | TOPICAL_CREAM | Freq: Three times a day (TID) | CUTANEOUS | Status: DC
  Administered 2021-07-25 – 2021-08-03 (×25): 1 via TOPICAL
  Filled 2021-07-24 (×2): qty 1

## 2021-07-24 MED ORDER — MILRINONE LACTATE IN DEXTROSE 20-5 MG/100ML-% IV SOLN
0.1250 ug/kg/min | INTRAVENOUS | Status: DC
  Administered 2021-07-24: 16:00:00 0.25 ug/kg/min via INTRAVENOUS
  Administered 2021-07-25: 0.125 ug/kg/min via INTRAVENOUS
  Administered 2021-07-25 (×2): 0.25 ug/kg/min via INTRAVENOUS
  Administered 2021-07-26: 0.125 ug/kg/min via INTRAVENOUS
  Filled 2021-07-24 (×4): qty 100

## 2021-07-24 MED ORDER — FENTANYL 2500MCG IN NS 250ML (10MCG/ML) PREMIX INFUSION
50.0000 ug/h | INTRAVENOUS | Status: DC
  Administered 2021-07-24: 100 ug/h via INTRAVENOUS
  Administered 2021-07-25: 150 ug/h via INTRAVENOUS
  Filled 2021-07-24 (×2): qty 250

## 2021-07-24 MED ORDER — DOCUSATE SODIUM 100 MG PO CAPS: 100.0000 mg | ORAL_CAPSULE | Freq: Two times a day (BID) | ORAL | Status: DC | PRN

## 2021-07-24 MED ORDER — FENTANYL CITRATE PF 50 MCG/ML IJ SOSY: 50.0000 ug | PREFILLED_SYRINGE | Freq: Once | INTRAMUSCULAR | Status: DC

## 2021-07-24 MED ORDER — "THROMBI-PAD 3""X3"" EX PADS"
1.0000 | MEDICATED_PAD | Freq: Once | CUTANEOUS | Status: DC
  Filled 2021-07-24: qty 1

## 2021-07-24 MED ORDER — AMIODARONE HCL IN DEXTROSE 360-4.14 MG/200ML-% IV SOLN
30.0000 mg/h | INTRAVENOUS | Status: DC
  Administered 2021-07-24 – 2021-07-30 (×15): 30 mg/h via INTRAVENOUS
  Filled 2021-07-24 (×14): qty 200

## 2021-07-24 MED ORDER — CHLORHEXIDINE GLUCONATE 0.12% ORAL RINSE (MEDLINE KIT)
15.0000 mL | Freq: Two times a day (BID) | OROMUCOSAL | Status: DC
  Administered 2021-07-24 – 2021-07-25 (×2): 15 mL via OROMUCOSAL

## 2021-07-24 MED ORDER — CHLORHEXIDINE GLUCONATE CLOTH 2 % EX PADS
6.0000 | MEDICATED_PAD | Freq: Every day | CUTANEOUS | Status: DC
  Administered 2021-07-25 – 2021-08-03 (×10): 6 via TOPICAL

## 2021-07-24 MED ORDER — SODIUM CHLORIDE 0.9% IV SOLUTION: Freq: Once | INTRAVENOUS | Status: AC

## 2021-07-24 MED ORDER — ORAL CARE MOUTH RINSE
15.0000 mL | OROMUCOSAL | Status: DC
  Administered 2021-07-24 – 2021-07-25 (×7): 15 mL via OROMUCOSAL

## 2021-07-24 MED ORDER — CHLORHEXIDINE GLUCONATE CLOTH 2 % EX PADS: 6.0000 | MEDICATED_PAD | Freq: Every day | CUTANEOUS | Status: DC

## 2021-07-24 MED ORDER — SODIUM CHLORIDE 0.9% FLUSH
10.0000 mL | Freq: Two times a day (BID) | INTRAVENOUS | Status: DC
  Administered 2021-07-24 – 2021-08-03 (×14): 10 mL

## 2021-07-24 MED ORDER — PERFLUTREN LIPID MICROSPHERE
1.0000 mL | INTRAVENOUS | Status: AC | PRN
  Administered 2021-07-24: 2 mL via INTRAVENOUS
  Filled 2021-07-24: qty 10

## 2021-07-24 MED ORDER — SODIUM CHLORIDE 0.9% FLUSH: 10.0000 mL | INTRAVENOUS | Status: DC | PRN

## 2021-07-24 MED ORDER — SODIUM CHLORIDE 0.9 % IV SOLN: INTRAVENOUS | Status: DC | PRN

## 2021-07-24 MED ORDER — SODIUM BICARBONATE 8.4 % IV SOLN
INTRAVENOUS | Status: DC
  Filled 2021-07-24: qty 1000

## 2021-07-24 MED ORDER — DOCUSATE SODIUM 50 MG/5ML PO LIQD
100.0000 mg | Freq: Two times a day (BID) | ORAL | Status: DC
  Administered 2021-07-24 – 2021-07-25 (×3): 100 mg
  Filled 2021-07-24 (×3): qty 10

## 2021-07-24 MED ORDER — POLYETHYLENE GLYCOL 3350 17 G PO PACK
17.0000 g | PACK | Freq: Every day | ORAL | Status: DC
  Administered 2021-07-25: 17 g
  Filled 2021-07-24: qty 1

## 2021-07-24 MED ORDER — PANTOPRAZOLE SODIUM 40 MG IV SOLR
40.0000 mg | Freq: Two times a day (BID) | INTRAVENOUS | Status: DC
  Administered 2021-07-24 – 2021-07-25 (×3): 40 mg via INTRAVENOUS
  Filled 2021-07-24 (×3): qty 10

## 2021-07-24 MED ORDER — MIDAZOLAM BOLUS VIA INFUSION
0.0000 mg | INTRAVENOUS | Status: DC | PRN
  Filled 2021-07-24: qty 5

## 2021-07-24 MED ORDER — NOREPINEPHRINE 4 MG/250ML-% IV SOLN: 0.0000 ug/min | INTRAVENOUS | Status: DC

## 2021-07-24 MED ORDER — POLYETHYLENE GLYCOL 3350 17 G PO PACK: 17.0000 g | PACK | Freq: Every day | ORAL | Status: DC | PRN

## 2021-07-24 MED ORDER — MIDAZOLAM-SODIUM CHLORIDE 100-0.9 MG/100ML-% IV SOLN
0.0000 mg/h | INTRAVENOUS | Status: DC
  Administered 2021-07-24 – 2021-07-25 (×2): 5 mg/h via INTRAVENOUS
  Filled 2021-07-24 (×2): qty 100

## 2021-07-24 NOTE — Progress Notes (Signed)
Date and time results received: 07/24/21 1544  Test Lactic Acid Critical Value: 5.4  Name of Provider Notified: Dr. Myrla Halsted  Orders Received? Or Actions Taken?: none

## 2021-07-24 NOTE — H&P (Signed)
NAME:  Donald [name], MRN:  [id number], DOB:  13-Nov-1985, LOS: 0 ADMISSION DATE:  07/24/2021, CONSULTATION DATE:  2/8 REFERRING MD:  Dr Claybon Jabs EDP, CHIEF COMPLAINT:  Resp failure.    History of Present Illness:  36 year old male with past medical history as below, which is significant for biventricular congestive heart failure, left regurgitation, paroxysmal atrial fib, pyoderma gangrenosum, and seronegative spondylitis.  He was undergoing work-up for repeat mitral valve repair, however, he was admitted to [facility] on 12/14 and recurrent heart failure exacerbation and cardiogenic shock with new LVEF 20 to 25% with severe MR.  Ultimately he required mitral valve replacement and was deemed high risk necessitating transfer to [facility] for surgery.  Valve replacement was done with mechanical valve on 12/26 by Dr. Wannetta Sender.  He was discharged on 1/30 on warfarin.  Since his discharge she has had ongoing issues with dyspnea and has presented several times to emergency care.  Most recently he presented on 2/6 in the [facility] system with evidence of volume overload.  He was treated with IV diuretics and had good output.  He was discharged home with plans to follow-up with his cardiologist on 2/8.  However, he presented to [facility] emergency department on 2/7 with SOB, chest pain, and palpitations.  Upon arrival he was noted to be in atrial fibrillation with RVR.  He started on amiodarone infusion and given 20 mg of furosemide and then 40 mg after limited response. BNP 21,000.  Course in the ED became complicated by confusion.  Patient found lying on the floor with low CBG.  Mental status improved with D50.  He became hypoxic and was placed on BiPAP.  CT of the head was negative.  ABG demonstrated metabolic acidosis.  Bicarb drip was started.  The patient ultimately required intubation due to respiratory distress and need for transfer.  PCCM accepted.  Pertinent  Medical History   has a  past medical history of Anemia, Autoimmune disorder ([place]), CHF (congestive heart failure) ([place]), Chronic systolic heart failure ([place]), DVT (deep venous thrombosis) ([place]), Dysrhythmia, Mitral regurgitation, Mitral stenosis, Myocardial infarction ([place]), Paroxysmal atrial flutter ([place]), Pyoderma gangrenosa, Seronegative spondylitis ([place]), and Tricuspid regurgitation.   Significant Hospital Events: Including procedures, antibiotic start and stop dates in addition to other pertinent events     Interim History / Subjective:    Objective   Resp. rate (!) 28, height 5\' 9"  (1.753 m), SpO2 100 %.    Vent Mode: PRVC FiO2 (%):  [40 %] 40 % Set Rate:  [16 bmp] 16 bmp Vt Set:  [560 mL] 560 mL PEEP:  [5 cmH20] 5 cmH20 Plateau Pressure:  [22 cmH20] 22 cmH20  No intake or output data in the 24 hours ending 07/24/21 1025 There were no vitals filed for this visit.  Examination: General: Young adult male in NAD on vent HENT: Normocephalic, atraumatic Lungs: Clear bilateral breath sounds.  Ventilator assisted breaths. Cardiovascular: Irregularly irregular, borderline tachycardia.  Click.  No murmurs. Abdomen: Soft, nondistended. Extremities: No acute deformity.  No significant edema. Neuro: Sedated GU: Foley Skin: Multiple old and new pustules some bleeding.  Open areas on sacrum not consistent with pressure injuries.    Resolved Hospital Problem list     Assessment & Plan:   Acute hypoxic respiratory failure: Likely due to CHF exacerbation and pulmonary edema.  Less likely pneumonia, however, cannot rule out with leukocytosis and hypothermia. - Full vent support - ABG, CXR now - VAP bundle - Fentanyl and  Versed for RASS goal 1- to -2.   Acute on chronic HFrEF - Echocardiogram - Ongoing diuresis as BP will tolerate. Will await chemistry - Consult to HF service.   S/p mitral valve replacement 12/26. Mechanical valve - Continue warfarin per pharmacy for goal INR 2.5-3.5 - Check INR -  Echo pending - Cardiology to see  Atrial fibrillation: now rate controlled. On presentation rates as high as 150s.  - Continue amiodarone infusion - EKG - Telemetry monitoring  Lactic acidosis: etiology unclear as hypotension not described in ED notes.  - Trend lactic acid - Bicarb infusion started at OSH, continue for now - If INR subtherapeutic may need to evaluate for ischemic bowel.  - More likely is due to global hypoperfusion  Hypoglycemia - D5 in bicarb infusion - Q4 hours CBG monitoring  ? GI bleed- Dark OGT secretions - Gastroccult  - Protonix BID - Octreotide if positive - INR pending  Seronegative spondylitis Pyoderma gangrenosum - Previously followed by rheumatology at [facility].    Best Practice (right click and "Reselect all SmartList Selections" daily)   Diet/type: NPO DVT prophylaxis: Coumadin GI prophylaxis: PPI Lines: N/A Foley:  Yes, and it is still needed Code Status:  full code Last date of multidisciplinary goals of care discussion [ ]   Labs   CBC: Recent Labs  Lab 07/17/21 1320 07/22/21 0248  WBC 6.6 6.9  NEUTROABS 3.9 3.5  HGB 10.2* 9.8*  HCT 32.9* 31.5*  MCV 84.6 81.4  PLT 417* 420*    Basic Metabolic Panel: Recent Labs  Lab 07/17/21 1320 07/22/21 0402  NA 131* 132*  K 3.7 3.3*  CL 101 100  CO2 22 22  GLUCOSE 128* 95  BUN 14 22*  CREATININE 1.02 0.95  CALCIUM 8.9 8.5*  MG 1.5*  --    GFR: Estimated Creatinine Clearance: 127.3 mL/min (by C-G formula based on SCr of 0.95 mg/dL). Recent Labs  Lab 07/17/21 1320 07/22/21 0248  WBC 6.6 6.9    Liver Function Tests: Recent Labs  Lab 07/17/21 1320 07/22/21 0402  AST 35 40  ALT 15 17  ALKPHOS 111 104  BILITOT 0.7 0.9  PROT 9.8* 8.9*  ALBUMIN 2.9* 3.1*   Recent Labs  Lab 07/22/21 0248  LIPASE 31   No results for input(s): AMMONIA in the last 168 hours.  ABG    Component Value Date/Time   PHART 7.403 09/11/2020 1427   PCO2ART 37.0 09/11/2020 1427   PO2ART 152  (H) 09/11/2020 1427   HCO3 26.9 09/11/2020 1439   TCO2 28 09/11/2020 1439   ACIDBASEDEF 1.0 09/11/2020 1427   O2SAT 55.7 06/04/2021 0604     Coagulation Profile: Recent Labs  Lab 07/17/21 1320 07/18/21 1056  INR 2.7* 3.4*    Cardiac Enzymes: No results for input(s): CKTOTAL, CKMB, CKMBINDEX, TROPONINI in the last 168 hours.  HbA1C: No results found for: HGBA1C  CBG: No results for input(s): GLUCAP in the last 168 hours.  Review of Systems:   Patient is encephalopathic and/or intubated. Therefore history has been obtained from chart review.   Past Medical History:  He,  has a past medical history of Anemia, Autoimmune disorder ([place]), CHF (congestive heart failure) ([place]), Chronic systolic heart failure ([place]), DVT (deep venous thrombosis) ([place]), Dysrhythmia, Mitral regurgitation, Mitral stenosis, Myocardial infarction ([place]), Paroxysmal atrial flutter ([place]), Pyoderma gangrenosa, Seronegative spondylitis ([place]), and Tricuspid regurgitation.   Surgical History:   Past Surgical History:  Procedure Laterality Date   CARDIAC CATHETERIZATION     HERNIA  REPAIR Right 2018   MITRAL VALVE REPAIR  06/07/2018   [place]   MITRAL VALVE REPLACEMENT N/A 06/23/2021   MULTIPLE EXTRACTIONS WITH ALVEOLOPLASTY N/A 11/08/2020   Procedure: EXTRACTION OF TEETH NUMBER ONE, FIFTHTEEN, SIXTEEN, SEVENTEEN, EIGHTTEEN AND THRTY WITH ALVEOLOPLASTY OF LOWER LEFT QUADRANT.;  Surgeon: Charlaine Dalton, DMD;  Location: [facility];  Service: Dentistry;  Laterality: N/A;   RIGHT/LEFT HEART CATH AND CORONARY ANGIOGRAPHY N/A 09/11/2020   Procedure: RIGHT/LEFT HEART CATH AND CORONARY ANGIOGRAPHY;  Surgeon: Jolaine Artist, MD;  Location: [facility];  Service: Cardiovascular;  Laterality: N/A;   TEE WITHOUT CARDIOVERSION N/A 05/23/2020   Procedure: TRANSESOPHAGEAL ECHOCARDIOGRAM (TEE) WITH PROPOFOL;  Surgeon: Arnoldo Lenis, MD;  [facility];  Service:  Endoscopy;  Laterality: N/A;     Social History:   reports that he quit smoking about 9 months ago. His smoking use included cigarettes. He has never used smokeless tobacco. He reports that he does not drink alcohol and does not use drugs.   Family History:  His family history includes Depression in his father; Diabetes in his father and paternal grandmother; Multiple sclerosis in his mother; Psoriasis in his mother.   Allergies No Known Allergies   Home Medications  Prior to Admission medications   Medication Sig Start Date End Date Taking? Authorizing Provider  acetaminophen (TYLENOL) 325 MG tablet Take 2 tablets (650 mg total) by mouth every 4 (four) hours as needed for headache or mild pain. 06/04/21   Clegg, Amy D, NP  furosemide (LASIX) 40 MG tablet Take 1 tablet (40 mg total) by mouth daily. 06/05/21   Clegg, Amy D, NP  Magnesium Oxide 200 MG TABS Take 1 tablet (200 mg total) by mouth 2 (two) times daily. 07/17/21   Daleen Bo, MD  metoprolol succinate (TOPROL-XL) 50 MG 24 hr tablet Take 50 mg by mouth daily. 07/10/21   [provider]  oxyCODONE-acetaminophen (PERCOCET) 5-325 MG tablet Take 1 tablet by mouth every 4 (four) hours as needed for severe pain. 07/17/21   Daleen Bo, MD  predniSONE (DELTASONE) 10 MG tablet Take q day 6,5,4,3,2,1 07/17/21   Daleen Bo, MD  spironolactone (ALDACTONE) 25 MG tablet Take 1 tablet (25 mg total) by mouth daily. Patient taking differently: Take 12.5 mg by mouth daily. 06/05/21   Darrick Grinder D, NP  warfarin (COUMADIN) 2 MG tablet Take 1 1/2 tablets daily except 2 tablets on Mondays, Wednesdays and Fridays or as directed 07/16/21   Arnoldo Lenis, MD     Critical care time: 48 minutes     Georgann Housekeeper, AGACNP-BC [place] for personal pager PCCM on call pager [phone] until 7pm. Please call Elink 7p-7a. [phone]  07/24/2021 10:57 AM

## 2021-07-24 NOTE — Procedures (Signed)
Central Venous Catheter Insertion Procedure Note  RAIQUAN CHANDLER  [id number]  1985/07/09  Date:07/24/21  Time:3:18 PM   Provider Performing:Dniya Neuhaus D Suzie Portela   Procedure: Insertion of Non-tunneled Central Venous [id number]) with US guidance (39767)   Indication(s) Medication administration  Consent Risks of the procedure as well as the alternatives and risks of each were explained to the patient and/or caregiver.  Consent for the procedure was obtained and is signed in the bedside chart  Anesthesia Topical only with 1% lidocaine   Timeout Verified patient identification, verified procedure, site/side was marked, verified correct patient position, special equipment/implants available, medications/allergies/relevant history reviewed, required imaging and test results available.  Sterile Technique Maximal sterile technique including full sterile barrier drape, hand hygiene, sterile gown, sterile gloves, mask, hair covering, sterile ultrasound probe cover (if used).  Procedure Description Area of catheter insertion was cleaned with chlorhexidine and draped in sterile fashion.  With real-time ultrasound guidance a central venous catheter was placed into the left internal jugular vein. Nonpulsatile blood flow and easy flushing noted in all ports.  The catheter was sutured in place and sterile dressing applied.  Complications/Tolerance None; patient tolerated the procedure well. Chest X-ray is ordered to verify placement for internal jugular or subclavian cannulation.   Chest x-ray is not ordered for femoral cannulation.  EBL Minimal  Specimen(s) None  JD Anselm Lis [place] [facility] 07/24/2021, 3:19 PM  Please see [email or web address] for pager details.  From 7A-7P if no response, please call [phone]. After hours, please call ELink [phone].

## 2021-07-24 NOTE — Procedures (Signed)
Arterial Catheter Insertion Procedure Note  Donald [name]  [id number]  July 06, 1985  Date:07/24/21  Time:3:44 PM    Provider Performing: Duayne Cal    Procedure: Insertion of Arterial Line (40981) with US guidance ([id number])   Indication(s) Blood pressure monitoring and/or need for frequent ABGs  Consent Risks of the procedure as well as the alternatives and risks of each were explained to the patient and/or caregiver.  Consent for the procedure was obtained and is signed in the bedside chart  Anesthesia None   Time Out Verified patient identification, verified procedure, site/side was marked, verified correct patient position, special equipment/implants available, medications/allergies/relevant history reviewed, required imaging and test results available.   Sterile Technique Maximal sterile technique including full sterile barrier drape, hand hygiene, sterile gown, sterile gloves, mask, hair covering, sterile ultrasound probe cover (if used).   Procedure Description Area of catheter insertion was cleaned with chlorhexidine and draped in sterile fashion. With real-time ultrasound guidance an arterial catheter was placed into the right radial artery.  Appropriate arterial tracings confirmed on monitor.     Complications/Tolerance None; patient tolerated the procedure well.   EBL Minimal   Specimen(s) None   Donald [name], AGACNP-BC [place] [facility]  See Amion for personal pager PCCM on call pager [phone] until 7pm. Please call Elink 7p-7a. [phone]  07/24/2021 3:45 PM

## 2021-07-24 NOTE — Progress Notes (Signed)
Date and time results received: 07/24/21 1740 (use smartphrase ".now" to insert current time)  Test: INR Critical Value: 9.8  Name of Provider Notified: Dr. Gala Romney  Orders Received? Or Actions Taken?: Orders received for FFP

## 2021-07-24 NOTE — Consult Note (Addendum)
Advanced Heart Failure Team Consult Note   Primary Physician: Patient, No Pcp Per (Inactive) PCP-Cardiologist:  Dina Rich, MD [facility]: Dr. Gala Romney   Reason for Consultation: acute on chronic systolic heart failure   HPI:    Donald [name] is seen today for evaluation of acute on chronic systolic heart failure at the request of Dr. Katrinka Blazing, PCCM.   Donald [name] is a 36 y.o. male with history of mitral regurgitation (s/p MV repair with resection of ruptured anterior papillary muscle and reconstruction of papillary chord and placement of annuloplasty ring in 2019). 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    The patient has been followed by Dr. Wyline Mood. Patient has had multiple episodes of acute exacerbation of fluid overload in 2020 and 2021 with at least one ER visit for acute pulmonary edema although he did not require hospitalization. Echo 11/21 EF 45% with  severe MR. TEE. 12/21 EF 40 to 45% with severe MR with multiple jets including a possible paravalvular leak, severe TR. The RV was moderate to severely HK with evidence of significant PH He has had several episodes of AFL with RVR, requiring DCCVs.    He saw Dr. Cornelius Moras in 2/22 for consideration of repeat MV surgery and subsequently referred to the [facility] for CHF evaluation/ optimization and RHC prior to potential re-do MVR. Saw Dr. Gala Romney for consultation and set up for [facility] 09/11/20 which showed normal coronaries, NICM EF 30-35%, severe MR w/ prominent v-waves in PCWP tracing, mild pulmonary venous HTN with normal CO (Fick cardiac output/index = 5.3/2.5).   He was scheduled to f/u w/ Dr. Cornelius Moras in early April to further discuss  surgery but he canceled the appt. Appointment was rescheduled but he cacellled again. Was also arranged to undergo dental extractions by Dr. Chales Salmon but canceled as well.   He was recently hospitalized for a/c CHF in December. [facility]ed [facility] ER 05/29/21 with recurrent HF and evidence of low output and AKI (Scr 0.97 ->-> 2.07). Lactic acid 3.5. Echo with EF 20-25% with severe MR/mod MS and severe RV dysfunction.    Transferred to [facility] on 05/30/21 for further management. Started on empiric milrinone. PICC placed. Due to low mixed venous saturation milrinone increased to 0.5 mcg with modest improvement . AKI resolved with addition of milrinone. CVP initially high but improved with IV lasix. Dr Gala Romney reviewed case with [facility] regarding MVR but he was felt to be too high-risk and they recommended transfer to [facility]. He underwent MVR w/ mechanical valve on 12/26 by Dr. Florian Buff at [facility].  He was discharged on 1/30 on warfarin.  Since discharge from [facility], he has struggled w/ CHF symptoms and volume overload. Went to ED on 2/6 with SOB and noted to fluid overloaded, given IV Lasix and discharged w/ instructions to f/u w/ cardiology. Went back to ED on 2/7 at [facility] w/ recurrent dyspnea, CP and palpitations and noted to be in Afib w/ RVR and fluid overloaded. Started on amio gtt. This was later complicated by AMS/confusion, hypoglycemia ( improved with D50) and hypoxic respiratory failure. Required intubation. ABG demonstrated metabolic acidosis, started on bicarb gtt and transferred to [facility] for further management. Admitted by PCCM on transfer. AHF team consulted for HF management.  Hs trop 37>>41. Lactic acid 5.4. INR 9.6 (was 2.8 at OSH prior to transfer). Hgb 11.9>>10.1.  No gross bleeding.   Currently intubated and sedated. Back in NSR, on amio gtt. PCCM just placed CVC. Co-ox sent and low at 54%. CVP 13.  MAP 84. COVID negative. Pro-BNP at OSH 21,000.   Echo  LVEF <20%, RV severely  reduced. Mechanical MV ok, mGradient 3 mmHg  Echo 12/22 (Pre MVR)  Left ventricular ejection fraction, by estimation, is 20 to 25%. The left ventricle has severely decreased function. The left ventricle demonstrates global hypokinesis. The left ventricular internal cavity size was mildly dilated. There is mild left ventricular hypertrophy. There is the interventricular septum is flattened in diastole ('D' shaped left ventricle), consistent with right ventricular volume overload. 1. Right ventricular systolic function is moderately reduced. The right ventricular size is moderately enlarged. There is moderately elevated pulmonary artery systolic pressure. 2. 3. Left atrial size was severely dilated. 4. Right atrial size was moderately dilated. S/p mitral valve repair. Elevated MV mean gradient 15 mmHg with increased HR 108 bpm. MR is severe as reported in TEE 05/23/2020. 5. 6. Aortic valve regurgitation is mild. The inferior vena cava is dilated in size with <50% respiratory variability, suggesting right atrial pressure of 15 mmHg.  2D Echo Today   1. Left ventricular ejection fraction, by estimation, is <20%. The left  ventricle has severely decreased function. The left ventricle demonstrates  global hypokinesis. The left ventricular internal cavity size was severely  dilated. Left ventricular  diastolic parameters are consistent with Grade II diastolic dysfunction  (pseudonormalization).   2. Right ventricular systolic function is severely reduced. The right  ventricular size is moderately enlarged. There is mildly elevated  pulmonary artery systolic pressure.   3. S/p mitral valve repair. MV mean gradient 3 mmHg HR 81 bpm.  Significant shadowing, MR has improved.   4. The aortic valve is grossly normal. Aortic valve regurgitation is  mild.   5. The inferior vena cava is dilated in size with <50% respiratory  variability, suggesting right atrial pressure of 15 mmHg.    Conclusion(s)/Recommendation(s): Compared to echo 05/31/2021, MV mean  gradient and MR have improved.   Review of Systems: [y] = yes, [ ]  = no   General: Weight gain [ ] ; Weight loss [ ] ; Anorexia [ ] ; Fatigue [ ] ; Fever [ ] ; Chills [ ] ; Weakness [ ]   Cardiac: Chest pain/pressure [ Y]; Resting SOB [ Y]; Exertional SOB [ Y]; Orthopnea [ ] ; Pedal Edema [ ] ; Palpitations [ ] ; Syncope [ ] ; Presyncope [ ] ; Paroxysmal nocturnal dyspnea[ ]   Pulmonary: Cough [ ] ; Wheezing[ ] ; Hemoptysis[ ] ; Sputum [ ] ; Snoring [ ]   GI: Vomiting[ ] ; Dysphagia[ ] ; Melena[ ] ; Hematochezia [ ] ; Heartburn[ ] ; Abdominal pain [ ] ; Constipation [ ] ; Diarrhea [ ] ; BRBPR [ ]   GU: Hematuria[ ] ; Dysuria [ ] ; Nocturia[ ]   Vascular: Pain in legs with walking [ ] ; Pain in feet with lying flat [ ] ; Non-healing sores [ ] ; Stroke [ ] ; TIA [ ] ; Slurred speech [ ] ;  Neuro: Headaches[ ] ; Vertigo[ ] ; Seizures[ ] ; Paresthesias[ ] ;Blurred vision [ ] ; Diplopia [ ] ; Vision changes [ ]   Ortho/Skin: Arthritis [ ] ; Joint pain [ ] ; Muscle pain [ ] ; Joint swelling [ ] ; Back Pain [ ] ; Rash [ ]   Psych: Depression[ ] ; Anxiety[ ]   Heme: Bleeding problems [ ] ; Clotting disorders [ ] ; Anemia [ Y]  Endocrine: Diabetes [ ] ; Thyroid dysfunction[ ]   Home Medications Prior to Admission medications   Medication Sig Start Date End Date Taking? Authorizing Provider  acetaminophen (TYLENOL) 325 MG tablet Take 2 tablets (650 mg total) by mouth every 4 (four) hours as needed for headache or mild pain. 06/04/21  Yes Clegg, Amy D, NP  furosemide (LASIX) 40 MG tablet Take 1 tablet (40 mg total) by mouth daily. 06/05/21  Yes Clegg, Amy D, NP  magnesium oxide (MAG-OX) 400 (240 Mg) MG tablet Take 200 mg by mouth 2 (two) times daily. 07/17/21  Yes [provider]  metoprolol succinate (TOPROL-XL) 50 MG 24 hr tablet Take 50 mg by mouth daily. 07/10/21  Yes [provider]  oxyCODONE-acetaminophen (PERCOCET) 5-325 MG tablet Take 1 tablet by mouth every 4  (four) hours as needed for severe pain. 07/17/21  Yes Daleen Bo, MD  spironolactone (ALDACTONE) 25 MG tablet Take 1 tablet (25 mg total) by mouth daily. Patient taking differently: Take 12.5 mg by mouth daily. 06/05/21  Yes Clegg, Amy D, NP  warfarin (COUMADIN) 2 MG tablet Take 1 1/2 tablets daily except 2 tablets on Mondays, Wednesdays and Fridays or as directed Patient taking differently: Take 3-4 mg by mouth See admin instructions. 4mg  once daily on Monday's, Wednesday's, and Friday's, and 3mg  every other day of the week 07/16/21  Yes Branch, Alphonse Guild, MD  Magnesium Oxide 200 MG TABS Take 1 tablet (200 mg total) by mouth 2 (two) times daily. Patient not taking: Reported on 07/24/2021 07/17/21   Daleen Bo, MD  predniSONE (DELTASONE) 10 MG tablet Take q day 6,5,4,3,2,1 Patient not taking: Reported on 07/24/2021 07/17/21   Daleen Bo, MD    Past Medical History: Past Medical History:  Diagnosis Date   Anemia    Autoimmune disorder ([place])    pyoderma gangrenosum   CHF (congestive heart failure) ([place])    Chronic systolic heart failure ([place])    a. EF 35-40% by echo in 07/2018 b. EF at 45% by repeat echo in 04/2020   DVT (deep venous thrombosis) (HCC)    h/o   Dysrhythmia    Mitral regurgitation    a. 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 b. 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HCC)    arthritis   Tricuspid regurgitation     Past Surgical History: Past Surgical History:  Procedure Laterality Date   CARDIAC CATHETERIZATION     HERNIA REPAIR Right 2018   MITRAL VALVE REPAIR  06/07/2018   [place] - Dr Virgina Organ   MITRAL VALVE REPLACEMENT N/A 06/23/2021   MULTIPLE EXTRACTIONS WITH ALVEOLOPLASTY N/A 11/08/2020   Procedure: EXTRACTION  OF TEETH NUMBER ONE, FIFTHTEEN, SIXTEEN, SEVENTEEN, EIGHTTEEN AND THRTY WITH ALVEOLOPLASTY OF LOWER LEFT QUADRANT.;  Surgeon: Charlaine Dalton, DMD;  Location: [place];  Service: Dentistry;  Laterality: N/A;   RIGHT/LEFT HEART CATH AND CORONARY ANGIOGRAPHY N/A 09/11/2020   Procedure: RIGHT/LEFT HEART CATH AND CORONARY ANGIOGRAPHY;  Surgeon: Jolaine Artist, MD;  Location: [place] [facility];  Service: Cardiovascular;  Laterality: N/A;   TEE WITHOUT CARDIOVERSION N/A 05/23/2020   Procedure: TRANSESOPHAGEAL ECHOCARDIOGRAM (TEE) WITH PROPOFOL;  Surgeon: Arnoldo Lenis, MD;  Location: [facility];  Service: Endoscopy;  Laterality: N/A;    Family History: Family History  Problem Relation  Age of Onset   Multiple sclerosis Mother    Psoriasis Mother    Depression Father    Diabetes Father    Diabetes Paternal Grandmother     Social History: Social History   Socioeconomic History   Marital status: Divorced    Spouse name: Not on file   Number of children: Not on file   Years of education: Not on file   Highest education level: Not on file  Occupational History   Not on file  Tobacco Use   Smoking status: Former    Types: Cigarettes    Quit date: 10/14/2020    Years since quitting: 0.7   Smokeless tobacco: Never  Vaping Use   Vaping Use: Never used  Substance and Sexual Activity   Alcohol use: No   Drug use: No   Sexual activity: Yes  Other Topics Concern   Not on file  Social History Narrative   Not on file   Social Determinants of Health   Financial Resource Strain: Not on file  Food Insecurity: Not on file  Transportation Needs: Not on file  Physical Activity: Not on file  Stress: Not on file  Social Connections: Not on file    Allergies:  No Known Allergies  Objective:    Vital Signs:   Temp:  [97.9 F (36.6 C)-98.2 F (36.8 C)] 97.9 F (36.6 C) (02/08 1245) Pulse Rate:  [81-85] 81 (02/08 1245) Resp:  [22-28] 24 (02/08 1442) BP: (102-116)/(82-94)  102/84 (02/08 1245) SpO2:  [100 %] 100 % (02/08 1442) FiO2 (%):  [40 %] 40 % (02/08 1442) Last BM Date:  (unsure)  Weight change: There were no vitals filed for this visit.  Intake/Output:   Intake/Output Summary (Last 24 hours) at 07/24/2021 1452 Last data filed at 07/24/2021 1115 Gross per 24 hour  Intake 50 ml  Output --  Net 50 ml      Physical Exam    CVP 13 General: critically ill young AAM male intubated and sedated HEENT: normal + ETT  Neck: supple. JVP to jaw, + Lt IJ CVC . Carotids 2+ bilat; no bruits. No lymphadenopathy or thyromegaly appreciated. Cor: PMI nondisplaced. Regular rate & rhythm. + sternal sight well healed. + mechanical heart sounds Lungs: intubated, decreased BS anteriorly  Abdomen: soft, nontender, nondistended. No hepatosplenomegaly. No bruits or masses. Good bowel sounds. Extremities: no cyanosis, clubbing, rash, edema + SCDs  Skin:  Multiple old and new pustules some bleeding.  Open areas on sacrum  Neuro: intubated and sedated  GU : + foley    Telemetry   NSR 70s   EKG    NSR 82 bpm   Labs   Basic Metabolic Panel: Recent Labs  Lab 07/22/21 0402 07/24/21 1038  NA 132* 135  K 3.3* 4.3  CL 100  --   CO2 22  --   GLUCOSE 95  --   BUN 22*  --   CREATININE 0.95  --   CALCIUM 8.5*  --     Liver Function Tests: Recent Labs  Lab 07/22/21 0402  AST 40  ALT 17  ALKPHOS 104  BILITOT 0.9  PROT 8.9*  ALBUMIN 3.1*   Recent Labs  Lab 07/22/21 0248  LIPASE 31   No results for input(s): AMMONIA in the last 168 hours.  CBC: Recent Labs  Lab 07/22/21 0248 07/24/21 1038  WBC 6.9  --   NEUTROABS 3.5  --   HGB 9.8* 11.9*  HCT 31.5* 35.0*  MCV  81.4  --   PLT 420*  --     Cardiac Enzymes: No results for input(s): CKTOTAL, CKMB, CKMBINDEX, TROPONINI in the last 168 hours.  BNP: BNP (last 3 results) Recent Labs    05/29/21 2214 07/17/21 1320 07/22/21 0248  BNP 2,863.0* 1,686.0* 2,708.0*    ProBNP (last 3  results) No results for input(s): PROBNP in the last 8760 hours.   CBG: Recent Labs  Lab 07/24/21 1029 07/24/21 1156  GLUCAP 155* 122*    Coagulation Studies: No results for input(s): LABPROT, INR in the last 72 hours.   Imaging   DG Chest Port 1 View  Result Date: 07/24/2021 CLINICAL DATA:  Endotracheal tube placement EXAM: PORTABLE CHEST 1 VIEW COMPARISON:  07/22/2021 FINDINGS: Endotracheal tube is approximately 5 cm above the carina. Enteric tube is within the stomach. Similar enlargement of the cardiac silhouette. Similar lung aeration. No significant pleural effusion. No pneumothorax. IMPRESSION: Stable lines and tubes. Decreased pulmonary vascular congestion. Similar cardiomegaly. Superimposed pericardial effusion not excluded. Electronically Signed   By: Macy Mis M.D.   On: 07/24/2021 10:48     Medications:     Current Medications:  docusate  100 mg Per Tube BID   fentaNYL (SUBLIMAZE) injection  50 mcg Intravenous Once   pantoprazole (PROTONIX) IV  40 mg Intravenous Q12H   polyethylene glycol  17 g Per Tube Daily   Thrombi-Pad  1 each Topical Once    Infusions:  sodium chloride 10 mL/hr at 07/24/21 1209   amiodarone 30 mg/hr (07/24/21 1154)   fentaNYL infusion INTRAVENOUS 100 mcg/hr (07/24/21 1203)   midazolam 5 mg/hr (07/24/21 1205)   milrinone     norepinephrine (LEVOPHED) Adult infusion     sodium bicarbonate 150 mEq in D5W infusion        Patient Profile   36 y/o male with previous MV repair in 2019 due to torn chord with subsequent recurrent MR, chronic systolic HF due to biventricular HF with EF 30-35% in 3/22, PAF and recent hospitalization 12/22 for a/c CHF w/ low output in setting of severe MR, requiring stabilization w/ milrinone and transfer to [facility] for high risk mechanical Mitral valve replacement on 12/26. Now readmitted to [facility] for a/c CHF/shock and acute hypoxic respiratory failure requiring intubation.    Assessment/Plan   1. Acute on  Chronic Biventricular Heart Failure>>Low Output Heart Failure   - BiV failure likely primarily valvular in nature in setting of severe MR, now s/p recent MV replacement 12/22 at [place] EF 40 to 45% with severe MR with multiple jets including a possible paravalvular leak. Miderate RV dysfunction severe TR - [facility] 3/22 showed normal coronaries, NICM EF 30-35%, severe MR w/ prominent v-waves in PCWP tracing, mild pulmonary venous HTN with normal CO (Fick cardiac output/index = 5.3/2.5). - Referred for re-do MVR 4/22 but did not follow-up w/ Dr. Roxy Manns - Admit 12/22 for low output HF, required milrinone. Echo EF 20-25%, mild LV dilation, D-shaped septum, moderately decreased RV systolic function with mild RV enlargement, s/p MV repair with mean gradient 15 mmHg and severe MR, mild TR, dilated IVC - Transferred to [facility] and underwent mechanical MVR on 12/26 - now readmitted w/ a/c CHF w/ fluid overload/pulmonary edema. CVP 13. Co-ox 54%. Lactic Acid 5.4  - Echo now shows LVEF <20%, RV severely reduced. Mechanical MV ok, mGradient 3 mmHg - suspect acute decompensation due to recurrent Afib w/ RVR - start milrinone 0.25 mcg/kg/min  - gentle diuresis w/ IV Lasix  given known severe RV dysfunction  - follow Co-ox and trend LA for clearance  - hold GDMT for now w/ soft BP and AKI  - not candidate for heart transplant given recent sternotomy and social issues/poor compliance    2. Shock - Lactic acid 5.4  - suspect primarily cardiogenic, co-ox 54% - start milrinone 0.25 and follow co-ox - continue bicarb gtt and follow lactate for clearance  - Leukocytosis, WBC 13K but AF. Suspect likely stress related but given mechanical valve + buttock wounds, would check blood cultures, consider empiric abx   3. Severe Mitral Valve Regurgitation s/p Mitral Valve Replacement  - previous MV repair in 2019 due to torn chord with subsequent recurrent MR - s/p mechanical MVR at [facility] 06/10/21 - repeat 2D echo w/  stable prothesis, mGradient 3 mmHg  - on coumadin for mechanical valve. INR 9.6 (was 2.8 at OSH, Check stat repeat for confirmation, hold coumadin for now)  - if repeat INR high, would avoid Vit K for reversal unless significant bleeding and allow INR to drift down slowly   4. Acute Hypoxic Respiratory Failure, S/p Intubation  - 2/2 acute pulmonary edema - vent management per PCCM  - gentle diurese w/ IV Lasix   5. Atrial Fibrillation w/ RVR  - in setting of long standing MV disease, echo w/ severe LAE,  LA diam: 5.90 cm - MAZE not done at time of MVR  - now back in NSR, HR 70s  - continue amio gtt 30/hr while on milrinone  - on coumadin for Afib + mechanical valve. INR 9.6 (check stat repeat, hold coumadin for now)  - keep K > 4.0 and Mg > 2.0   6. AKI  - baseline SCr ~1.0 - 1.89 on admit  - suspect cardiorenal, Co-ox 54% - support CO w/ milrinone + diurese w/ IV lasix - avoid hypotension, NE if needed  - follow BMP   7. Sacral Wounds  -  Multiple old and new pustules some bleeding.  Open areas on sacrum (see images in CCM note)  - WOC consult  - check blood cultures   8. Supratherapeutic INR - INR 9.6, check STAT repeat to confirm  - hold coumadin and monitor for bleeding  - w/ mechanical MV, would avoid Vit K for reversal unless significant bleeding  - known severe RV failure, check HFTs. Suspect shock liver    Length of Stay: 0  Robbie Lis, PA-C  07/24/2021, 2:52 PM  Advanced Heart Failure Team Pager [phone] (M-F; 7a - 5p)  Please contact [facility] for night-coverage after hours (4p -7a ) and weekends on [email or web address]  Agree with above.    36 y/o male as above with h/o MVR 2019 due to torn chord. Admitted in 12/22 with recurrent HF found to have severe MR and EF 20-25%. Underwent re-do MVR 06/10/21 at [facility].   Now admitted with recurrent cardiogenic shock in setting of AF. With lactate 5, respr failure and AKI. Echo with EF 10% RV severely down. MVR ok    Now back in NSR on IV amio  General:  Intubated sedated HEENT: normal Neck: supple. JVP to jawCarotids 2+ bilat; no bruits. No lymphadenopathy or thryomegaly appreciated. Cor: PMI nondisplaced. Regular rate & rhythm. No rubs, gallops or murmurs. Lungs: coarse Abdomen: soft, nontender, nondistended. No hepatosplenomegaly. No bruits or masses. Good bowel sounds. Extremities: no cyanosis, clubbing, rash, edema Neuro: intubated sedated  He is critically ill with cardiogenic shock in setting of recent MVR, severe biventricular  failure and new onset AF.   Now stabilizing with return of NSR and milrinone support. Will continue current support. Start diuresis tomorrow.   Will need to determine if he is candidate for advanced therapies. Appreciate CCM care.   CRITICAL CARE Performed by: Glori Bickers  Total critical care time: 45 minutes  Critical care time was exclusive of separately billable procedures and treating other patients.  Critical care was necessary to treat or prevent imminent or life-threatening deterioration.  Critical care was time spent personally by me (independent of midlevel providers or residents) on the following activities: development of treatment plan with patient and/or surrogate as well as nursing, discussions with consultants, evaluation of patient's response to treatment, examination of patient, obtaining history from patient or surrogate, ordering and performing treatments and interventions, ordering and review of laboratory studies, ordering and review of radiographic studies, pulse oximetry and re-evaluation of patient's condition.  Glori Bickers, MD  5:34 PM

## 2021-07-24 NOTE — Consult Note (Signed)
WOC Nurse Consult Note: Reason for Consult: Patient with chronic, nonhealing lesions and PMH of hidradenitis suppurativa. While outside the scope of WOC Nursing practice,  we are consulted for topical care suggestions while patient is in house, critically ill. He was seen by my associate in December of last year. Wound type: autoimmune Pressure Injury POA:N/A Measurement:N/A Diffuse presentation over buttocks: active lesions, as well as healed and scarred lesions Wound bed:Red, serosanguinous exudate in a small amount from open lesions.  Please see photo taken today on Admission by provider.  Drainage (amount, consistency, odor) As noted above Periwound: scarring Dressing procedure/placement/frequency: I have provided conservative topical care guidance for Nursing that includes washing the buttocks and posterior thighs with soap and water, rinsing and patting dry, then following with a thin application of Gerhart's butt cream, primarily because the compounded preparation contains hydrocortisone, zinc oxide and lotrimin creams. Turning and repositioning is in place and time in the supine position will be minimized. Heels are to be floated.  While in the ICU, patient is on a mattress replacement iwht low air loss feature and low friction coefficient (CoF) and antimicrobial bed linens are in use. Post discharge, patient would benefit from a referral to Dermatology.  If you agree, please order/arrange. If desired, a surgical consult (CCS) may also provide adjunctive recommendations.  WOC nursing team will not follow, but will remain available to this patient, the nursing and medical teams.  Please re-consult if needed. Thanks, Ladona Mow, MSN, RN, GNP, [place]  Pager# [phone]

## 2021-07-24 NOTE — Progress Notes (Signed)
Date and time results received: 07/24/21 1603  Test: INR Critical Value: 9.6  Name of Provider Notified: Robbie Lis, PA-C  Orders Received? Or Actions Taken?: Repeat test.

## 2021-07-25 ENCOUNTER — Inpatient Hospital Stay (HOSPITAL_COMMUNITY): Payer: Medicaid Other

## 2021-07-25 DIAGNOSIS — R570 Cardiogenic shock: Secondary | ICD-10-CM

## 2021-07-25 LAB — BPAM FFP
Blood Product Expiration Date: 202302132359
ISSUE DATE / TIME: 202302082019
Unit Type and Rh: 7300

## 2021-07-25 LAB — CBC
HCT: 28.3 % — ABNORMAL LOW (ref 39.0–52.0)
HCT: 29.2 % — ABNORMAL LOW (ref 39.0–52.0)
Hemoglobin: 9.1 g/dL — ABNORMAL LOW (ref 13.0–17.0)
Hemoglobin: 9.2 g/dL — ABNORMAL LOW (ref 13.0–17.0)
MCH: 24.7 pg — ABNORMAL LOW (ref 26.0–34.0)
MCH: 24.7 pg — ABNORMAL LOW (ref 26.0–34.0)
MCHC: 32.2 g/dL (ref 30.0–36.0)
MCHC: 31.5 g/dL (ref 30.0–36.0)
MCV: 76.9 fL — ABNORMAL LOW (ref 80.0–100.0)
MCV: 78.5 fL — ABNORMAL LOW (ref 80.0–100.0)
Platelets: 261 10*3/uL (ref 150–400)
Platelets: 273 10*3/uL (ref 150–400)
RBC: 3.68 MIL/uL — ABNORMAL LOW (ref 4.22–5.81)
RBC: 3.72 MIL/uL — ABNORMAL LOW (ref 4.22–5.81)
RDW: 15.1 % (ref 11.5–15.5)
RDW: 15.3 % (ref 11.5–15.5)
WBC: 8.5 10*3/uL (ref 4.0–10.5)
WBC: 9.7 10*3/uL (ref 4.0–10.5)
nRBC: 0.7 % — ABNORMAL HIGH (ref 0.0–0.2)
nRBC: 0.4 % — ABNORMAL HIGH (ref 0.0–0.2)

## 2021-07-25 LAB — POCT I-STAT 7, (LYTES, BLD GAS, ICA,H+H)
Acid-Base Excess: 9 mmol/L — ABNORMAL HIGH (ref 0.0–2.0)
Acid-Base Excess: 10 mmol/L — ABNORMAL HIGH (ref 0.0–2.0)
Acid-Base Excess: 11 mmol/L — ABNORMAL HIGH (ref 0.0–2.0)
Bicarbonate: 32.1 mmol/L — ABNORMAL HIGH (ref 20.0–28.0)
Bicarbonate: 34.6 mmol/L — ABNORMAL HIGH (ref 20.0–28.0)
Bicarbonate: 36.3 mmol/L — ABNORMAL HIGH (ref 20.0–28.0)
Calcium, Ion: 1.13 mmol/L — ABNORMAL LOW (ref 1.15–1.40)
Calcium, Ion: 1.18 mmol/L (ref 1.15–1.40)
Calcium, Ion: 1.16 mmol/L (ref 1.15–1.40)
HCT: 30 % — ABNORMAL LOW (ref 39.0–52.0)
HCT: 31 % — ABNORMAL LOW (ref 39.0–52.0)
HCT: 35 % — ABNORMAL LOW (ref 39.0–52.0)
Hemoglobin: 10.2 g/dL — ABNORMAL LOW (ref 13.0–17.0)
Hemoglobin: 10.5 g/dL — ABNORMAL LOW (ref 13.0–17.0)
Hemoglobin: 11.9 g/dL — ABNORMAL LOW (ref 13.0–17.0)
O2 Saturation: 100 %
O2 Saturation: 99 %
O2 Saturation: 100 %
Patient temperature: 36
Potassium: 3 mmol/L — ABNORMAL LOW (ref 3.5–5.1)
Potassium: 3.3 mmol/L — ABNORMAL LOW (ref 3.5–5.1)
Potassium: 3.4 mmol/L — ABNORMAL LOW (ref 3.5–5.1)
Sodium: 140 mmol/L (ref 135–145)
Sodium: 140 mmol/L (ref 135–145)
Sodium: 141 mmol/L (ref 135–145)
TCO2: 33 mmol/L — ABNORMAL HIGH (ref 22–32)
TCO2: 36 mmol/L — ABNORMAL HIGH (ref 22–32)
TCO2: 38 mmol/L — ABNORMAL HIGH (ref 22–32)
pCO2 arterial: 36 mmHg (ref 32.0–48.0)
pCO2 arterial: 47 mmHg (ref 32.0–48.0)
pCO2 arterial: 51.5 mmHg — ABNORMAL HIGH (ref 32.0–48.0)
pH, Arterial: 7.555 — ABNORMAL HIGH (ref 7.350–7.450)
pH, Arterial: 7.475 — ABNORMAL HIGH (ref 7.350–7.450)
pH, Arterial: 7.455 — ABNORMAL HIGH (ref 7.350–7.450)
pO2, Arterial: 141 mmHg — ABNORMAL HIGH (ref 83.0–108.0)
pO2, Arterial: 111 mmHg — ABNORMAL HIGH (ref 83.0–108.0)
pO2, Arterial: 173 mmHg — ABNORMAL HIGH (ref 83.0–108.0)

## 2021-07-25 LAB — COMPREHENSIVE METABOLIC PANEL
ALT: 480 U/L — ABNORMAL HIGH (ref 0–44)
AST: 856 U/L — ABNORMAL HIGH (ref 15–41)
Albumin: 2.5 g/dL — ABNORMAL LOW (ref 3.5–5.0)
Alkaline Phosphatase: 90 U/L (ref 38–126)
Anion gap: 10 (ref 5–15)
BUN: 35 mg/dL — ABNORMAL HIGH (ref 6–20)
CO2: 29 mmol/L (ref 22–32)
Calcium: 8.5 mg/dL — ABNORMAL LOW (ref 8.9–10.3)
Chloride: 97 mmol/L — ABNORMAL LOW (ref 98–111)
Creatinine, Ser: 1.48 mg/dL — ABNORMAL HIGH (ref 0.61–1.24)
GFR, Estimated: >60 mL/min (ref >60)
Glucose, Bld: 114 mg/dL — ABNORMAL HIGH (ref 70–99)
Potassium: 3 mmol/L — ABNORMAL LOW (ref 3.5–5.1)
Sodium: 136 mmol/L (ref 135–145)
Total Bilirubin: 1.6 mg/dL — ABNORMAL HIGH (ref 0.3–1.2)
Total Protein: 7.8 g/dL (ref 6.5–8.1)

## 2021-07-25 LAB — BASIC METABOLIC PANEL
Anion gap: 7 (ref 5–15)
BUN: 30 mg/dL — ABNORMAL HIGH (ref 6–20)
CO2: 31 mmol/L (ref 22–32)
Calcium: 8.2 mg/dL — ABNORMAL LOW (ref 8.9–10.3)
Chloride: 98 mmol/L (ref 98–111)
Creatinine, Ser: 1.27 mg/dL — ABNORMAL HIGH (ref 0.61–1.24)
GFR, Estimated: >60 mL/min (ref >60)
Glucose, Bld: 112 mg/dL — ABNORMAL HIGH (ref 70–99)
Potassium: 3.2 mmol/L — ABNORMAL LOW (ref 3.5–5.1)
Sodium: 136 mmol/L (ref 135–145)

## 2021-07-25 LAB — PROTIME-INR
INR: 6.2 (ref 0.8–1.2)
Prothrombin Time: 55.2 seconds — ABNORMAL HIGH (ref 11.4–15.2)

## 2021-07-25 LAB — COOXEMETRY PANEL
Carboxyhemoglobin: 1.5 % (ref 0.5–1.5)
Methemoglobin: 0.9 % (ref 0.0–1.5)
O2 Saturation: 80.2 %
Total hemoglobin: 9 g/dL — ABNORMAL LOW (ref 12.0–16.0)

## 2021-07-25 LAB — LEGIONELLA PNEUMOPHILA SEROGP 1 UR AG: L. pneumophila Serogp 1 Ur Ag: NEGATIVE

## 2021-07-25 LAB — PREPARE FRESH FROZEN PLASMA

## 2021-07-25 LAB — MAGNESIUM
Magnesium: 1.6 mg/dL — ABNORMAL LOW (ref 1.7–2.4)
Magnesium: 2 mg/dL (ref 1.7–2.4)

## 2021-07-25 LAB — GLUCOSE, CAPILLARY
Glucose-Capillary: 165 mg/dL — ABNORMAL HIGH (ref 70–99)
Glucose-Capillary: 104 mg/dL — ABNORMAL HIGH (ref 70–99)
Glucose-Capillary: 104 mg/dL — ABNORMAL HIGH (ref 70–99)
Glucose-Capillary: 114 mg/dL — ABNORMAL HIGH (ref 70–99)
Glucose-Capillary: 113 mg/dL — ABNORMAL HIGH (ref 70–99)
Glucose-Capillary: 120 mg/dL — ABNORMAL HIGH (ref 70–99)
Glucose-Capillary: 130 mg/dL — ABNORMAL HIGH (ref 70–99)

## 2021-07-25 LAB — LACTIC ACID, PLASMA: Lactic Acid, Venous: 1.3 mmol/L (ref 0.5–1.9)

## 2021-07-25 LAB — PHOSPHORUS
Phosphorus: 1.9 mg/dL — ABNORMAL LOW (ref 2.5–4.6)
Phosphorus: 2.5 mg/dL (ref 2.5–4.6)

## 2021-07-25 MED ORDER — MAGNESIUM SULFATE 2 GM/50ML IV SOLN
2.0000 g | Freq: Once | INTRAVENOUS | Status: AC
  Administered 2021-07-25: 2 g via INTRAVENOUS
  Filled 2021-07-25: qty 50

## 2021-07-25 MED ORDER — DOCUSATE SODIUM 100 MG PO CAPS
100.0000 mg | ORAL_CAPSULE | Freq: Two times a day (BID) | ORAL | Status: DC
  Administered 2021-07-26 – 2021-07-29 (×5): 100 mg via ORAL
  Filled 2021-07-25 (×12): qty 1

## 2021-07-25 MED ORDER — POLYETHYLENE GLYCOL 3350 17 G PO PACK
17.0000 g | PACK | Freq: Every day | ORAL | Status: DC
  Administered 2021-07-27 – 2021-07-28 (×2): 17 g via ORAL
  Filled 2021-07-25 (×5): qty 1

## 2021-07-25 MED ORDER — FUROSEMIDE 10 MG/ML IJ SOLN
40.0000 mg | Freq: Once | INTRAMUSCULAR | Status: AC
  Administered 2021-07-25: 40 mg via INTRAVENOUS
  Filled 2021-07-25: qty 4

## 2021-07-25 MED ORDER — SODIUM CHLORIDE 0.9% FLUSH: 10.0000 mL | INTRAVENOUS | Status: DC | PRN

## 2021-07-25 MED ORDER — MAGNESIUM SULFATE 2 GM/50ML IV SOLN
2.0000 g | Freq: Once | INTRAVENOUS | Status: AC
  Administered 2021-07-25: 2 g via INTRAVENOUS

## 2021-07-25 MED ORDER — DEXTROSE 5 % IV SOLN: INTRAVENOUS | Status: DC

## 2021-07-25 MED ORDER — SODIUM CHLORIDE 0.9% FLUSH
10.0000 mL | Freq: Two times a day (BID) | INTRAVENOUS | Status: DC
  Administered 2021-07-25 – 2021-08-03 (×13): 10 mL

## 2021-07-25 MED ORDER — POTASSIUM PHOSPHATES 15 MMOLE/5ML IV SOLN
30.0000 mmol | Freq: Once | INTRAVENOUS | Status: AC
  Administered 2021-07-25: 30 mmol via INTRAVENOUS

## 2021-07-25 MED ORDER — ENSURE ENLIVE PO LIQD
237.0000 mL | Freq: Two times a day (BID) | ORAL | Status: DC
  Administered 2021-07-26 – 2021-07-27 (×3): 237 mL via ORAL

## 2021-07-25 MED ORDER — POLYETHYLENE GLYCOL 3350 17 G PO PACK
17.0000 g | PACK | Freq: Every day | ORAL | Status: DC | PRN
  Administered 2021-07-27: 17 g via ORAL
  Filled 2021-07-25: qty 1

## 2021-07-25 MED ORDER — DEXTROSE 10 % IV SOLN: INTRAVENOUS | Status: DC

## 2021-07-25 MED ORDER — MAGNESIUM SULFATE 2 GM/50ML IV SOLN
INTRAVENOUS | Status: AC
  Administered 2021-07-25: 2 g
  Filled 2021-07-25: qty 50

## 2021-07-25 MED ORDER — WARFARIN - PHARMACIST DOSING INPATIENT: Freq: Every day | Status: DC

## 2021-07-25 MED ORDER — ADULT MULTIVITAMIN W/MINERALS CH
1.0000 | ORAL_TABLET | Freq: Every day | ORAL | Status: DC
  Administered 2021-07-27 – 2021-08-03 (×8): 1 via ORAL
  Filled 2021-07-25 (×10): qty 1

## 2021-07-25 NOTE — Progress Notes (Signed)
NAME:  Donald [name], MRN:  [id number], DOB:  [birth year], LOS: 1 ADMISSION DATE:  07/24/2021, CONSULTATION DATE:  2/8 REFERRING MD:  Dr Claybon Jabs EDP, CHIEF COMPLAINT:  Resp failure.    History of Present Illness:  36 year old male with mechanical MVR back in December at [facility]. Since discharge he has had progressive dyspnea causing multiple ED presentations treated with diuresis and discharged on increased lasix and plans for follow up. Admitted to [facility] 2/6 with SOB and hallucinations. Intubated. Transferred to [facility] for ICU care and heart failure service consultation.   Pertinent  Medical History   has a past medical history of Anemia, Autoimmune disorder ([place]), CHF (congestive heart failure) ([place]), Chronic systolic heart failure ([place]), DVT (deep venous thrombosis) ([place]), Dysrhythmia, Mitral regurgitation, Mitral stenosis, Myocardial infarction ([place]), Paroxysmal atrial flutter ([place]), Pyoderma gangrenosa, Seronegative spondylitis ([place]), and Tricuspid regurgitation.   Significant Hospital Events: Including procedures, antibiotic start and stop dates in addition to other pertinent events   2/6 admit to [facility] 2/8 Intubated, Echo with reduced EF 10%. Milrinone started.   Interim History / Subjective:  Bradycardic event overnight. Electrolyte abnormalities corrected. Rates back into 90s.   Objective   Blood pressure 103/64, pulse 78, temperature 97.7 F (36.5 C), resp. rate 16, height 5\' 9"  (1.753 m), weight 93.2 kg, SpO2 100 %. CVP:  [7 mmHg-30 mmHg] 7 mmHg  Vent Mode: PRVC FiO2 (%):  [40 %] 40 % Set Rate:  [16 bmp] 16 bmp Vt Set:  [560 mL] 560 mL PEEP:  [5 cmH20] 5 cmH20 Plateau Pressure:  [18 cmH20-24 cmH20] 24 cmH20   Intake/Output Summary (Last 24 hours) at 07/25/2021 0726 Last data filed at 07/25/2021 0600 Gross per 24 hour  Intake 1692.47 ml  Output 1300 ml  Net 392.47 ml   Filed Weights   07/25/21 0500  Weight: 93.2 kg    Examination:  General: Young adult  male on vent in NAD HENT: [place]/AT, PERRL, JVD Lungs: Clear bilateral breath sounds Cardiovascular: Regular, click.  Abdomen: Soft, non-tender, non-distended Extremities: No acute deformity or significant edema.  Neuro: Sedated GU: Foley Skin: Multiple old and new pustules some bleeding.  Open areas on sacrum not consistent with pressure injuries.   Significant labs reviewed: pending Imaging/studies reviewed by me: Echocardiogram LVEF < 20%, grade 2 DD. Severe RV systolic dysfunction. IVC dilated.   Resolved Hospital Problem list     Assessment & Plan:   Acute hypoxic respiratory failure: Likely due to CHF exacerbation and pulmonary edema.  Less likely pneumonia, however, cannot rule out with leukocytosis and hypothermia. - Full vent support - Check ABG - VAP bundle - Fentanyl and Versed for RASS goal 1- to -2.  - Will plan for WUA and SBT today pending ABG.   Cardiogenic shock Acute on chronic HFrEF: LVEF 25% in December now < 20%.  - Management per heart failure service.  - Milrinone infusion - Diuresis per cardiology.   S/p mitral valve replacement 12/26. Mechanical valve - Supratherapeutic INR: FFP given last night.  - Repeat INR - Echo pending - Cardiology to see  Atrial fibrillation: now rate controlled. On presentation rates as high as 150s.  - Continue amiodarone infusion - EKG - Telemetry monitoring  Lactic acidosis: etiology unclear as hypotension not described in ED notes. Lactic now cleared.  - Bicarb off  Hypoglycemia - Transition D5 to D10 to limit volume.  - Q4 hours CBG monitoring  GI bleed upper - Dark OGT secretions. Gastroccult  positive - Protonix BID - Trend CBC - Will consult with MD regarding correcting INR vs replacing blood. INR pending for this morning.   AKI:  - Trend BMP  Seronegative spondylitis Pyoderma gangrenosum - Previously followed by [facility]ology at [facility].    Best Practice (right click and "Reselect all SmartList  Selections" daily)   Diet/type: NPO DVT prophylaxis: Coumadin GI prophylaxis: PPI Lines: Central line, Arterial Line, and yes and it is still needed Foley:  Yes, and it is still needed Code Status:  full code Last date of multidisciplinary goals of care discussion [ ]     Critical care time: 45 minutes     Georgann Housekeeper, AGACNP-BC [place] for personal pager PCCM on call pager [phone] until 7pm. Please call Elink 7p-7a. [phone]  07/25/2021 7:26 AM

## 2021-07-25 NOTE — TOC CM/SW Note (Signed)
.. °  Transition of Care [facility]) Screening Note   Patient Details  Name: MCIHAEL HINDERMAN Date of Birth: Feb 18, 1986   [facility] of [facility]) CM/SW Contact:    Elliot Cousin, RN Phone Number: 07/25/2021, 11:34 AM    Transition of Care Department [facility]) has reviewed patient . We will continue to monitor patient advancement through interdisciplinary progression rounds. Patient will need assistance with medications, PCP, no insurance listed.

## 2021-07-25 NOTE — Procedures (Signed)
Extubation Procedure Note  Patient Details:   Name: Donald [name] DOB: [birth year] MRN: [id number]   Airway Documentation:    Vent end date: 07/25/21 Vent end time: 1200   Evaluation  O2 sats: stable throughout Complications: No apparent complications Patient did tolerate procedure well. Bilateral Breath Sounds: Diminished   Yes  Toula Moos 07/25/2021, 12:04 PM

## 2021-07-25 NOTE — Progress Notes (Addendum)
Advanced Heart Failure Rounding Note  PCP-Cardiologist: Carlyle Dolly, MD   Subjective:   Admitted with A/C HFrEF/shock. Intubated.   - Transferred to [facility] from [facility] for shock/HF.   2/8 Bld CX- NGTD  Lactic acid 5.4>1.3 INR 9.8>6.2   CO-OX 80% on milrinone 0.25 mcg.   Intubated/sedated.    Objective:   Weight Range: 93.2 kg Body mass index is 30.34 kg/m.   Vital Signs:   Temp:  [96.8 F (36 C)-98.4 F (36.9 C)] 97.9 F (36.6 C) (02/09 0700) Pulse Rate:  [40-86] 82 (02/09 0726) Resp:  [15-28] 19 (02/09 0726) BP: (89-129)/(55-94) 95/72 (02/09 0700) SpO2:  [98 %-100 %] 100 % (02/09 0726) Arterial Line BP: (103-132)/(52-74) 106/60 (02/09 0700) FiO2 (%):  [40 %] 40 % (02/09 0726) Weight:  [93.2 kg] 93.2 kg (02/09 0500) Last BM Date:  (PTA)  Weight change: Filed Weights   07/25/21 0500  Weight: 93.2 kg    Intake/Output:   Intake/Output Summary (Last 24 hours) at 07/25/2021 0748 Last data filed at 07/25/2021 0700 Gross per 24 hour  Intake 1947.34 ml  Output 1300 ml  Net 647.34 ml      Physical Exam   CVP 3 General:  Intubated.  HEENT: ETT Neck: Supple. JVP flat . Carotids 2+ bilat; no bruits. No lymphadenopathy or thyromegaly appreciated.LIJ  Cor: PMI nondisplaced. Regular rate & rhythm. No rubs, gallops. Mechanical S2 Lungs: Clear Abdomen: Soft, nontender, nondistended. No hepatosplenomegaly. No bruits or masses. Good bowel sounds. Extremities: No cyanosis, clubbing, rash, edema Neuro: Intubated  Telemetry   SR 80s with PVCs.    EKG    N/A   Labs    CBC Recent Labs    07/24/21 1433 07/25/21 0130 07/25/21 0149  WBC 13.3* 8.5  --   HGB 10.1* 9.1* 10.2*  HCT 31.1* 28.3* 30.0*  MCV 79.5* 76.9*  --   PLT 362 261  --    Basic Metabolic Panel Recent Labs    07/24/21 1433 07/25/21 0130 07/25/21 0149  NA 134* 136 140  K 4.2 3.0* 3.0*  CL 93* 97*  --   CO2 25 29  --   GLUCOSE 118* 114*  --   BUN 38* 35*  --   CREATININE 1.89*  1.48*  --   CALCIUM 8.5* 8.5*  --   MG 1.9 1.6*  --   PHOS 5.3* 1.9*  --    Liver Function Tests Recent Labs    07/24/21 1614 07/25/21 0130  AST 969* 856*  ALT 450* 480*  ALKPHOS 107 90  BILITOT 1.6* 1.6*  PROT 8.1 7.8  ALBUMIN 2.6* 2.5*   No results for input(s): LIPASE, AMYLASE in the last 72 hours. Cardiac Enzymes No results for input(s): CKTOTAL, CKMB, CKMBINDEX, TROPONINI in the last 72 hours.  BNP: BNP (last 3 results) Recent Labs    07/17/21 1320 07/22/21 0248 07/24/21 1433  BNP 1,686.0* 2,708.0* 3,184.1*    ProBNP (last 3 results) No results for input(s): PROBNP in the last 8760 hours.   D-Dimer No results for input(s): DDIMER in the last 72 hours. Hemoglobin A1C Recent Labs    07/24/21 1433  HGBA1C 6.4*   Fasting Lipid Panel No results for input(s): CHOL, HDL, LDLCALC, TRIG, CHOLHDL, LDLDIRECT in the last 72 hours. Thyroid Function Tests No results for input(s): TSH, T4TOTAL, T3FREE, THYROIDAB in the last 72 hours.  Invalid input(s): FREET3  Other results:   Imaging    DG CHEST PORT 1 VIEW  Result Date:  07/24/2021 CLINICAL DATA:  Central line placement. EXAM: PORTABLE CHEST 1 VIEW COMPARISON:  Same day. FINDINGS: Stable cardiomegaly. Endotracheal and nasogastric tubes are unchanged in position. Interval placement of left internal jugular catheter with distal tip in expected position of cavoatrial junction. No pneumothorax is noted. Lungs are clear. Bony thorax is unremarkable. Status post cardiac valve repair. IMPRESSION: Interval placement of left internal jugular catheter with distal tip in expected position of cavoatrial junction. Electronically Signed   By: Marijo Conception M.D.   On: 07/24/2021 15:24   DG Chest Port 1 View  Result Date: 07/24/2021 CLINICAL DATA:  Endotracheal tube placement EXAM: PORTABLE CHEST 1 VIEW COMPARISON:  07/22/2021 FINDINGS: Endotracheal tube is approximately 5 cm above the carina. Enteric tube is within the stomach.  Similar enlargement of the cardiac silhouette. Similar lung aeration. No significant pleural effusion. No pneumothorax. IMPRESSION: Stable lines and tubes. Decreased pulmonary vascular congestion. Similar cardiomegaly. Superimposed pericardial effusion not excluded. Electronically Signed   By: Macy Mis M.D.   On: 07/24/2021 10:48   ECHOCARDIOGRAM COMPLETE  Result Date: 07/24/2021    ECHOCARDIOGRAM REPORT   Patient Name:   Donald [name] Date of Exam: 07/24/2021 Medical Rec #:  [id number]           Height:       69.0 in Accession #:    [id number]          Weight:       214.8 lb Date of Birth:  [birth year]          BSA:          2.130 m Patient Age:    35 years            BP:           102/84 mmHg Patient Gender: M                   HR:           81 bpm. Exam Location:  Inpatient Procedure: 2D Echo, Cardiac Doppler, Color Doppler and Intracardiac            Opacification Agent Indications:    Dyspnea  History:        Patient has prior history of Echocardiogram examinations, most                 recent 05/31/2021. CHF, Previous Myocardial Infarction, S/P                 mechanical MVR, Arrythmias:Atrial Fibrillation;                 Signs/Symptoms:Murmur.  Sonographer:    Luisa Hart RDCS Referring Phys: [phone] [place]  1. Left ventricular ejection fraction, by estimation, is <20%. The left ventricle has severely decreased function. The left ventricle demonstrates global hypokinesis. The left ventricular internal cavity size was severely dilated. Left ventricular diastolic parameters are consistent with Grade II diastolic dysfunction (pseudonormalization).  2. Right ventricular systolic function is severely reduced. The right ventricular size is moderately enlarged. There is mildly elevated pulmonary artery systolic pressure.  3. S/p mitral valve repair. MV mean gradient 3 mmHg HR 81 bpm. Significant shadowing, MR has improved.  4. The aortic valve is grossly normal. Aortic valve  regurgitation is mild.  5. The inferior vena cava is dilated in size with <50% respiratory variability, suggesting right atrial pressure of 15 mmHg. Conclusion(s)/Recommendation(s): Compared to echo 05/31/2021, MV mean gradient and MR have improved.  FINDINGS  Left Ventricle: Left ventricular ejection fraction, by estimation, is <20%. The left ventricle has severely decreased function. The left ventricle demonstrates global hypokinesis. The left ventricular internal cavity size was severely dilated. Left ventricular diastolic parameters are consistent with Grade II diastolic dysfunction (pseudonormalization). Right Ventricle: The right ventricular size is moderately enlarged. No increase in right ventricular wall thickness. Right ventricular systolic function is severely reduced. There is mildly elevated pulmonary artery systolic pressure. The tricuspid regurgitant velocity is 2.38 m/s, and with an assumed right atrial pressure of 15 mmHg, the estimated right ventricular systolic pressure is 51.0 mmHg. Left Atrium: Left atrial size was not well visualized. Right Atrium: Right atrial size was normal in size. Pericardium: There is no evidence of pericardial effusion. Mitral Valve: S/p mitral valve repair. MV mean gradient 3 mmHg HR 81 bpm. Significant shadowing, MR has improved. MV peak gradient, 7.4 mmHg. The mean mitral valve gradient is 3.0 mmHg. Tricuspid Valve: The tricuspid valve is grossly normal. Tricuspid valve regurgitation is mild. Aortic Valve: The aortic valve is grossly normal. Aortic valve regurgitation is mild. Aortic regurgitation PHT measures 816 msec. Aortic valve mean gradient measures 2.0 mmHg. Aortic valve peak gradient measures 3.1 mmHg. Aortic valve area, by VTI measures 3.14 cm. Pulmonic Valve: The pulmonic valve was grossly normal. Pulmonic valve regurgitation is mild to moderate. Aorta: The aortic root and ascending aorta are structurally normal, with no evidence of dilitation. Venous: The  inferior vena cava is dilated in size with less than 50% respiratory variability, suggesting right atrial pressure of 15 mmHg. IAS/Shunts: The interatrial septum was not well visualized.  LEFT VENTRICLE PLAX 2D LVIDd:         7.10 cm      Diastology LVIDs:         6.20 cm      LV e' medial:    3.77 cm/s LV PW:         1.20 cm      LV E/e' medial:  30.5 LV IVS:        0.80 cm      LV e' lateral:   4.91 cm/s LVOT diam:     2.80 cm      LV E/e' lateral: 23.4 LV SV:         46 LV SV Index:   22 LVOT Area:     6.16 cm  LV Volumes (MOD) LV vol d, MOD A4C: 281.0 ml LV vol s, MOD A4C: 258.0 ml LV SV MOD A4C:     281.0 ml RIGHT VENTRICLE RV Basal diam:  5.60 cm RV Mid diam:    3.80 cm RV S prime:     5.78 cm/s TAPSE (M-mode): 2.0 cm LEFT ATRIUM             Index LA diam:        5.90 cm 2.77 cm/m LA Vol (A2C):   72.1 ml 33.86 ml/m LA Vol (A4C):   70.4 ml 33.06 ml/m LA Biplane Vol: 79.0 ml 37.10 ml/m  AORTIC VALVE                    PULMONIC VALVE AV Area (Vmax):    4.03 cm     PV Vmax:          0.69 m/s AV Area (Vmean):   4.06 cm     PV Vmean:         48.300 cm/s AV Area (VTI):     3.14 cm  PV VTI:           0.128 m AV Vmax:           88.10 cm/s   PV Peak grad:     1.9 mmHg AV Vmean:          60.300 cm/s  PV Mean grad:     1.0 mmHg AV VTI:            0.146 m      PR End Diast Vel: 11.42 msec AV Peak Grad:      3.1 mmHg AV Mean Grad:      2.0 mmHg LVOT Vmax:         57.70 cm/s LVOT Vmean:        39.800 cm/s LVOT VTI:          0.074 m LVOT/AV VTI ratio: 0.51 AI PHT:            816 msec  AORTA Ao Root diam: 3.50 cm Ao Asc diam:  3.50 cm MITRAL VALVE                TRICUSPID VALVE MV Area (PHT): 5.88 cm     TR Peak grad:   22.7 mmHg MV Area VTI:   1.82 cm     TR Vmax:        238.00 cm/s MV Peak grad:  7.4 mmHg MV Mean grad:  3.0 mmHg     SHUNTS MV Vmax:       1.36 m/s     Systemic VTI:  0.07 m MV Vmean:      82.5 cm/s    Systemic Diam: 2.80 cm MV Decel Time: 129 msec MV E velocity: 115.00 cm/s MV A velocity: 53.80  cm/s MV E/A ratio:  2.14 Landscape architect signed by Phineas Inches Signature Date/Time: 07/24/2021/4:17:25 PM    Final      Medications:     Scheduled Medications:  chlorhexidine gluconate (MEDLINE KIT)  15 mL Mouth Rinse BID   Chlorhexidine Gluconate Cloth  6 each Topical Daily   docusate  100 mg Per Tube BID   fentaNYL (SUBLIMAZE) injection  50 mcg Intravenous Once   Gerhardt's butt cream  1 application Topical TID   mouth rinse  15 mL Mouth Rinse 10 times per day   pantoprazole (PROTONIX) IV  40 mg Intravenous Q12H   polyethylene glycol  17 g Per Tube Daily   sodium chloride flush  10-40 mL Intracatheter Q12H   Thrombi-Pad  1 each Topical Once    Infusions:  sodium chloride 10 mL/hr at 07/25/21 0700   amiodarone 30 mg/hr (07/25/21 0700)   dextrose 75 mL/hr at 07/25/21 0225   fentaNYL infusion INTRAVENOUS 150 mcg/hr (07/25/21 0700)   midazolam 5 mg/hr (07/25/21 0700)   milrinone 0.25 mcg/kg/min (07/25/21 0700)   norepinephrine (LEVOPHED) Adult infusion     potassium PHOSPHATE IVPB (in mmol) 83 mL/hr at 07/25/21 0700   sodium bicarbonate 150 mEq in D5W infusion Stopped (07/25/21 0219)    PRN Medications: sodium chloride, docusate sodium, fentaNYL, Gerhardt's butt cream, midazolam, polyethylene glycol, sodium chloride flush    Patient Profile   36 y/o male with previous MV repair in 2019 due to torn chord with subsequent recurrent MR, chronic systolic HF due to biventricular HF with EF 30-35% in 3/22, PAF and recent hospitalization 12/22 for a/c CHF w/ low output in setting of severe MR, requiring stabilization w/ milrinone and transfer to [facility] for high risk mechanical Mitral  valve replacement on 12/26. Now readmitted to [facility] for a/c CHF/shock and acute hypoxic respiratory failure requiring intubation.      Assessment/Plan  1. Acute on Chronic Biventricular Heart Failure>>Low Output Heart Failure   - BiV failure likely primarily valvular in nature in setting of severe  MR, now s/p recent MV replacement 12/22 at [facility] EF 40 to 45% with severe MR with multiple jets including a possible paravalvular leak. Miderate RV dysfunction severe TR - [facility] 3/22 showed normal coronaries, NICM EF 30-35%, severe MR w/ prominent v-waves in PCWP tracing, mild pulmonary venous HTN with normal CO (Fick cardiac output/index = 5.3/2.5). - Referred for re-do MVR 4/22 but did not follow-up w/ Dr. Roxy Manns - Admit 12/22 for low output HF, required milrinone. Echo EF 20-25%, mild LV dilation, D-shaped septum, moderately decreased RV systolic function with mild RV enlargement, s/p MV repair with mean gradient 15 mmHg and severe MR, mild TR, dilated IVC - Transferred to [facility] and underwent mechanical MVR on 12/26 - Admitted w/ a/c CHF/shock  - Echo now shows LVEF <20%, RV severely reduced. Mechanical MV ok, mGradient 3 mmHg - suspect acute decompensation due to recurrent Afib w/ RVR - CO-OX 80%. Cut back milrinone to 0.125 mcg.   - CVP 2-3. No diuretics.  - hold GDMT for now w/ soft BP and AKI  - not candidate for heart transplant given recent sternotomy and social issues/poor compliance     2. Shock - Lactic acid 5.4  - suspect primarily cardiogenic, co-ox 54% - start milrinone 0.25 and follow co-ox - continue bicarb gtt and follow lactate for clearance. Lactic acid 5.4> 1.3 .  -CVP 2-3. Does not need diuretics.  -  Suspect likely stress related but given mechanical valve + buttock wounds - Blood cx pending.    3. Severe Mitral Valve Regurgitation s/p Mitral Valve Replacement  - previous MV repair in 2019 due to torn chord with subsequent recurrent MR - s/p mechanical MVR at [facility] 06/10/21 - repeat 2D echo w/ stable prothesis, mGradient 3 mmHg  - INR 9.6 on admit. Coumadin on hold     4. Acute Hypoxic Respiratory Failure, S/p Intubation  - 2/2 acute pulmonary edema - vent management per PCCM  - Possible extubation.    5. Atrial Fibrillation w/ RVR  - in setting of  long standing MV disease, echo w/ severe LAE,  LA diam: 5.90 cm - MAZE not done at time of MVR  - Maintaining SR.  - continue amio gtt 30/hr while on milrinone  - INR supratherapeutic. Holding coumadin.    6. AKI  - baseline SCr ~1.0 - 1.89 on admit -->1.5 - suspect cardiorenal - support CO w/ milrinone + diurese w/ IV lasix - avoid hypotension, NE if needed  - follow BMP    7. Sacral Wounds  -  Multiple old and new pustules some bleeding.  Open areas on sacrum (see images in CCM note)  - WOC consult  - Blood CX obtained.    8. Supratherapeutic INR - INR 9.6 on admit.-->- Given FFP - INR down to 6.2 today - hold coumadin and monitor for bleeding  - w/ mechanical MV, would avoid Vit K for reversal unless significant bleeding   Length of Stay: 1  Amy Clegg, NP  07/25/2021, 7:48 AM  Advanced Heart Failure Team Pager [phone] (M-F; 7a - 5p)  Please contact [facility] for night-coverage after hours (5p -7a ) and weekends on [email or web address]  Agree with  above.  Remains intubated. On milrinone and IV lasix. Diuresing well. Back in sinus rhythm on po amio. INR down to 6 after 1u FFP   General:  Sedated on vent HEENT: normal + ETT Neck: supple.JVP 6 . LIJ TLC with small hematoma Carotids 2+ bilat; no bruits. Cor: PMI nondisplaced. Regular rate & rhythm. Mechanical s1 Lungs: clear Abdomen: soft, nontender, nondistended. No hepatosplenomegaly. No bruits or masses. Good bowel sounds. Extremities: no cyanosis, clubbing, rash, tr edema Neuro:intubated/sedated.   He is much improved on milrinone and with restoration of NSR. Has diuresed well. Continue milrinone. Give one more dose of lasix then plan extubation. Will need maintenance of NSR to stabilize. Long-term plan tenuous. Have d/w [facility] team. May need to consider transplant down the road if he is a candidate.   CRITICAL CARE Performed by: Glori Bickers  Total critical care time: 40 minutes  Critical care time was exclusive  of separately billable procedures and treating other patients.  Critical care was necessary to treat or prevent imminent or life-threatening deterioration.  Critical care was time spent personally by me (independent of midlevel providers or residents) on the following activities: development of treatment plan with patient and/or surrogate as well as nursing, discussions with consultants, evaluation of patient's response to treatment, examination of patient, obtaining history from patient or surrogate, ordering and performing treatments and interventions, ordering and review of laboratory studies, ordering and review of radiographic studies, pulse oximetry and re-evaluation of patient's condition.  Glori Bickers, MD  10:27 PM

## 2021-07-25 NOTE — Progress Notes (Addendum)
Around 0130am, patient's heart rhythm began to switch rates in 90s with frequent PVCs to a sinus brady in rates of 40s with a prolonged QTC. An EKG was done for both rhythms.   Labs were sent during downtime (PAPER SHEET IN PHYSICAL CHART) Potassium 3.0 Magnesium 1.6 Phosphorus 1.9  ABG  pH 7.6 PCO2 36 PO2 141 Bicarb 32  Verbal orders from [place] MD to  Discontinue bicarb gtt Start D5 gtt for hypoglycemia Mag 2g IVBP 30 mmol potassium phosphate IV  Everything in this note was done during Epic system downtime.

## 2021-07-25 NOTE — Progress Notes (Signed)
ANTICOAGULATION CONSULT NOTE  Pharmacy Consult for warfarin  Indication: mechanical MVR   No Known Allergies  Patient Measurements: Height: 5\' 9"  (175.3 cm) Weight: 93.2 kg (205 lb 7.5 oz) IBW/kg (Calculated) : 70.7  Vital Signs: Temp: 97.3 F (36.3 C) (02/09 1000) Temp Source: Esophageal (02/09 0800) BP: 103/75 (02/09 1000) Pulse Rate: 78 (02/09 1000)  Labs: Recent Labs    07/24/21 1433 07/24/21 1614 07/25/21 0130 07/25/21 0149 07/25/21 0827 07/25/21 0828  HGB 10.1*  --  9.1* 10.2* 10.5* 9.2*  HCT 31.1*  --  28.3* 30.0* 31.0* 29.2*  PLT 362  --  261  --   --  273  LABPROT 77.2* 78.7* 55.2*  --   --   --   INR 9.6* 9.8* 6.2*  --   --   --   CREATININE 1.89*  --  1.48*  --   --  1.27*    Estimated Creatinine Clearance: 93.3 mL/min (A) (by C-G formula based on SCr of 1.27 mg/dL (H)).   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 EF 35-40% by echo in 07/2018 b. EF at 45% by repeat echo in 04/2020   DVT (deep venous thrombosis) (HCC)    h/o   Dysrhythmia    Mitral regurgitation    a. 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 b. 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HCC)    arthritis   Tricuspid regurgitation     Assessment: 36 yo M present on 07/24/2021 with shortness of breath and found to have acute/chronic heart failure. Of note, recent history of mechanical MV replacement 12/22 at [facility]. On warfarin PTA with last dose 07/22/21. PTA regimen: MWF 4mg , STThS 3mg  (total weekly 24mg ). Pharmacy consulted for warfarin management.   2/8 INR 9.8, supra-therapeutic, warfarin held 2/9 INR  6.2, supra-therapeutic   CBC stable, Scr improving, no overt s/s of bleeding   Goal of Therapy:  INR goal: 2.5-3.5 Monitor platelets by anticoagulation protocol: Yes   Plan:  Continue to hold warfarin today Daily INR   Thank you for allowing pharmacy to participate in this patient's care.  Levonne Spiller, PharmD PGY1 Acute Care Resident  07/25/2021,10:35 AM

## 2021-07-25 NOTE — Progress Notes (Signed)
Initial Nutrition Assessment  DOCUMENTATION CODES:   Not applicable  INTERVENTION:   Ensure Enlive po BID, each supplement provides 350 kcal and 20 grams of protein, once diet advanced  MVI with Minerals daily  NUTRITION DIAGNOSIS:   Increased nutrient needs related to chronic illness as evidenced by estimated needs.  GOAL:   Patient will meet greater than or equal to 90% of their needs  MONITOR:   PO intake, Supplement acceptance, Diet advancement, Labs  REASON FOR ASSESSMENT:   Ventilator    ASSESSMENT:   36 yo male admitted with acute on chronic CHF, cardiogenic shock. PMH includes severe MV regurgitation s/p MV replacement at [facility] Dec 2022, Seronegative spondylitis, Pyoderma gangrenosum  Pt on vent support during rounds this AM but later extubate  NPO at present D10 at 30 ml/hr  Unable to obtain diet and weight history at this time of visit  Labs: potassium 3.0 (L), phosphorus 1.9 (L), magnesium 1.6 (L), CBGs 100-108 Meds: colace, miralax, potassium phosphate   Diet Order:   Diet Order             Diet NPO time specified  Diet effective now                   EDUCATION NEEDS:   Not appropriate for education at this time  Skin:  Skin Assessment: Skin Integrity Issues: Skin Integrity Issues:: Other (Comment) Other: open wounds on buttocks/sacrum-hidradenitis suppurativa  Last BM:  PTA  Height:   Ht Readings from Last 1 Encounters:  07/24/21 5\' 9"  (1.753 m)    Weight:   Wt Readings from Last 1 Encounters:  07/25/21 93.2 kg    BMI:  Body mass index is 30.34 kg/m.  Estimated Nutritional Needs:   Kcal:  2200-2400 kcals  Protein:  110-125 g  Fluid:  1.8 L    09/22/21 MS, RDN, LDN, CNSC Registered Dietitian III Clinical Nutrition RD Pager and On-Call Pager Number Located in [place]

## 2021-07-26 DIAGNOSIS — I5023 Acute on chronic systolic (congestive) heart failure: Principal | ICD-10-CM

## 2021-07-26 LAB — POCT I-STAT 7, (LYTES, BLD GAS, ICA,H+H)
Acid-Base Excess: 6 mmol/L — ABNORMAL HIGH (ref 0.0–2.0)
Acid-Base Excess: 8 mmol/L — ABNORMAL HIGH (ref 0.0–2.0)
Acid-Base Excess: 12 mmol/L — ABNORMAL HIGH (ref 0.0–2.0)
Bicarbonate: 31.6 mmol/L — ABNORMAL HIGH (ref 20.0–28.0)
Bicarbonate: 32.8 mmol/L — ABNORMAL HIGH (ref 20.0–28.0)
Bicarbonate: 36.1 mmol/L — ABNORMAL HIGH (ref 20.0–28.0)
Calcium, Ion: 1.22 mmol/L (ref 1.15–1.40)
Calcium, Ion: 1.17 mmol/L (ref 1.15–1.40)
Calcium, Ion: 1.16 mmol/L (ref 1.15–1.40)
HCT: 34 % — ABNORMAL LOW (ref 39.0–52.0)
HCT: 35 % — ABNORMAL LOW (ref 39.0–52.0)
HCT: 35 % — ABNORMAL LOW (ref 39.0–52.0)
Hemoglobin: 11.6 g/dL — ABNORMAL LOW (ref 13.0–17.0)
Hemoglobin: 11.9 g/dL — ABNORMAL LOW (ref 13.0–17.0)
Hemoglobin: 11.9 g/dL — ABNORMAL LOW (ref 13.0–17.0)
O2 Saturation: 92 %
O2 Saturation: 99 %
O2 Saturation: 100 %
Patient temperature: 98.1
Patient temperature: 97.7
Patient temperature: 97.7
Potassium: 3.4 mmol/L — ABNORMAL LOW (ref 3.5–5.1)
Potassium: 3.8 mmol/L (ref 3.5–5.1)
Potassium: 4.3 mmol/L (ref 3.5–5.1)
Sodium: 135 mmol/L (ref 135–145)
Sodium: 135 mmol/L (ref 135–145)
Sodium: 132 mmol/L — ABNORMAL LOW (ref 135–145)
TCO2: 33 mmol/L — ABNORMAL HIGH (ref 22–32)
TCO2: 34 mmol/L — ABNORMAL HIGH (ref 22–32)
TCO2: 37 mmol/L — ABNORMAL HIGH (ref 22–32)
pCO2 arterial: 46 mmHg (ref 32.0–48.0)
pCO2 arterial: 44 mmHg (ref 32.0–48.0)
pCO2 arterial: 41.7 mmHg (ref 32.0–48.0)
pH, Arterial: 7.443 (ref 7.350–7.450)
pH, Arterial: 7.478 — ABNORMAL HIGH (ref 7.350–7.450)
pH, Arterial: 7.544 — ABNORMAL HIGH (ref 7.350–7.450)
pO2, Arterial: 62 mmHg — ABNORMAL LOW (ref 83.0–108.0)
pO2, Arterial: 113 mmHg — ABNORMAL HIGH (ref 83.0–108.0)
pO2, Arterial: 220 mmHg — ABNORMAL HIGH (ref 83.0–108.0)

## 2021-07-26 LAB — COMPREHENSIVE METABOLIC PANEL
ALT: 632 U/L — ABNORMAL HIGH (ref 0–44)
AST: 842 U/L — ABNORMAL HIGH (ref 15–41)
Albumin: 2.5 g/dL — ABNORMAL LOW (ref 3.5–5.0)
Alkaline Phosphatase: 109 U/L (ref 38–126)
Anion gap: 6 (ref 5–15)
BUN: 18 mg/dL (ref 6–20)
CO2: 33 mmol/L — ABNORMAL HIGH (ref 22–32)
Calcium: 8.5 mg/dL — ABNORMAL LOW (ref 8.9–10.3)
Chloride: 97 mmol/L — ABNORMAL LOW (ref 98–111)
Creatinine, Ser: 1.08 mg/dL (ref 0.61–1.24)
GFR, Estimated: >60 mL/min (ref >60)
Glucose, Bld: 140 mg/dL — ABNORMAL HIGH (ref 70–99)
Potassium: 3 mmol/L — ABNORMAL LOW (ref 3.5–5.1)
Sodium: 136 mmol/L (ref 135–145)
Total Bilirubin: 1.4 mg/dL — ABNORMAL HIGH (ref 0.3–1.2)
Total Protein: 7.8 g/dL (ref 6.5–8.1)

## 2021-07-26 LAB — GLUCOSE, CAPILLARY
Glucose-Capillary: 126 mg/dL — ABNORMAL HIGH (ref 70–99)
Glucose-Capillary: 120 mg/dL — ABNORMAL HIGH (ref 70–99)
Glucose-Capillary: 110 mg/dL — ABNORMAL HIGH (ref 70–99)
Glucose-Capillary: 157 mg/dL — ABNORMAL HIGH (ref 70–99)
Glucose-Capillary: 114 mg/dL — ABNORMAL HIGH (ref 70–99)

## 2021-07-26 LAB — CBC
HCT: 30.5 % — ABNORMAL LOW (ref 39.0–52.0)
Hemoglobin: 9.7 g/dL — ABNORMAL LOW (ref 13.0–17.0)
MCH: 25.5 pg — ABNORMAL LOW (ref 26.0–34.0)
MCHC: 31.8 g/dL (ref 30.0–36.0)
MCV: 80.3 fL (ref 80.0–100.0)
Platelets: 267 10*3/uL (ref 150–400)
RBC: 3.8 MIL/uL — ABNORMAL LOW (ref 4.22–5.81)
RDW: 15.8 % — ABNORMAL HIGH (ref 11.5–15.5)
WBC: 8.9 10*3/uL (ref 4.0–10.5)
nRBC: 0 % (ref 0.0–0.2)

## 2021-07-26 LAB — MAGNESIUM: Magnesium: 1.8 mg/dL (ref 1.7–2.4)

## 2021-07-26 LAB — BASIC METABOLIC PANEL
Anion gap: 8 (ref 5–15)
BUN: 14 mg/dL (ref 6–20)
CO2: 29 mmol/L (ref 22–32)
Calcium: 8.5 mg/dL — ABNORMAL LOW (ref 8.9–10.3)
Chloride: 96 mmol/L — ABNORMAL LOW (ref 98–111)
Creatinine, Ser: 1.05 mg/dL (ref 0.61–1.24)
GFR, Estimated: >60 mL/min (ref >60)
Glucose, Bld: 149 mg/dL — ABNORMAL HIGH (ref 70–99)
Potassium: 3.9 mmol/L (ref 3.5–5.1)
Sodium: 133 mmol/L — ABNORMAL LOW (ref 135–145)

## 2021-07-26 LAB — COOXEMETRY PANEL
Carboxyhemoglobin: 1.4 % (ref 0.5–1.5)
Methemoglobin: 0.6 % (ref 0.0–1.5)
O2 Saturation: 65.5 %
Total hemoglobin: 9.8 g/dL — ABNORMAL LOW (ref 12.0–16.0)

## 2021-07-26 LAB — PROTIME-INR
INR: 4.7 (ref 0.8–1.2)
Prothrombin Time: 44.2 seconds — ABNORMAL HIGH (ref 11.4–15.2)

## 2021-07-26 LAB — AMMONIA: Ammonia: 29 umol/L (ref 9–35)

## 2021-07-26 LAB — PHOSPHORUS: Phosphorus: 2.2 mg/dL — ABNORMAL LOW (ref 2.5–4.6)

## 2021-07-26 MED ORDER — POTASSIUM CHLORIDE CRYS ER 20 MEQ PO TBCR
40.0000 meq | EXTENDED_RELEASE_TABLET | Freq: Once | ORAL | Status: AC
  Administered 2021-07-26: 40 meq via ORAL
  Filled 2021-07-26: qty 2

## 2021-07-26 MED ORDER — MAGNESIUM SULFATE 2 GM/50ML IV SOLN
2.0000 g | Freq: Once | INTRAVENOUS | Status: AC
  Administered 2021-07-26: 2 g via INTRAVENOUS
  Filled 2021-07-26: qty 50

## 2021-07-26 MED ORDER — METOLAZONE 2.5 MG PO TABS
2.5000 mg | ORAL_TABLET | Freq: Once | ORAL | Status: AC
  Administered 2021-07-26: 2.5 mg via ORAL
  Filled 2021-07-26: qty 1

## 2021-07-26 MED ORDER — POTASSIUM CHLORIDE 10 MEQ/50ML IV SOLN
10.0000 meq | INTRAVENOUS | Status: AC
  Administered 2021-07-26 (×2): 10 meq via INTRAVENOUS
  Filled 2021-07-26 (×2): qty 50

## 2021-07-26 MED ORDER — "THROMBI-PAD 3""X3"" EX PADS"
1.0000 | MEDICATED_PAD | Freq: Once | CUTANEOUS | Status: AC
  Administered 2021-07-26: 1 via TOPICAL
  Filled 2021-07-26: qty 1

## 2021-07-26 MED ORDER — POTASSIUM PHOSPHATES 15 MMOLE/5ML IV SOLN
15.0000 mmol | Freq: Once | INTRAVENOUS | Status: AC
  Administered 2021-07-26: 15 mmol via INTRAVENOUS
  Filled 2021-07-26: qty 5

## 2021-07-26 MED ORDER — POTASSIUM CHLORIDE 20 MEQ PO PACK
40.0000 meq | PACK | Freq: Once | ORAL | Status: AC
  Administered 2021-07-26: 40 meq via ORAL
  Filled 2021-07-26: qty 2

## 2021-07-26 MED ORDER — FUROSEMIDE 10 MG/ML IJ SOLN
80.0000 mg | Freq: Two times a day (BID) | INTRAMUSCULAR | Status: DC
  Administered 2021-07-26 – 2021-07-27 (×3): 80 mg via INTRAVENOUS
  Filled 2021-07-26 (×3): qty 8

## 2021-07-26 MED ORDER — FUROSEMIDE 10 MG/ML IJ SOLN
40.0000 mg | Freq: Two times a day (BID) | INTRAMUSCULAR | Status: DC
  Administered 2021-07-26: 40 mg via INTRAVENOUS
  Filled 2021-07-26: qty 4

## 2021-07-26 MED ORDER — FUROSEMIDE 10 MG/ML IJ SOLN: 40.0000 mg | Freq: Once | INTRAMUSCULAR | Status: DC

## 2021-07-26 MED ORDER — SPIRONOLACTONE 12.5 MG HALF TABLET
12.5000 mg | ORAL_TABLET | Freq: Every day | ORAL | Status: DC
  Administered 2021-07-26 – 2021-07-29 (×4): 12.5 mg via ORAL
  Filled 2021-07-26 (×4): qty 1

## 2021-07-26 MED ORDER — PANTOPRAZOLE SODIUM 40 MG PO TBEC
40.0000 mg | DELAYED_RELEASE_TABLET | Freq: Every day | ORAL | Status: DC
  Administered 2021-07-26 – 2021-08-03 (×9): 40 mg via ORAL
  Filled 2021-07-26 (×10): qty 1

## 2021-07-26 MED ORDER — POTASSIUM CHLORIDE CRYS ER 20 MEQ PO TBCR
20.0000 meq | EXTENDED_RELEASE_TABLET | Freq: Once | ORAL | Status: AC
  Administered 2021-07-26: 20 meq via ORAL
  Filled 2021-07-26: qty 1

## 2021-07-26 NOTE — Progress Notes (Addendum)
eLink Physician-Brief Progress Note Patient Name: Donald [name] DOB: Jul 01, 1985 MRN: [id number]   Date of Service  07/26/2021  HPI/Events of Note  66M in cardiogenic shock 2/2 heart failure Called to evaluate tachypnea and lethargy. Patient awake and alert with RR 30-40s. Recent CVP 12. Lasix and metolazone ordered by overnight Cardiology PA  eICU Interventions  Start BiPAP for respiratory support Updated family at bedside If mental status worsens, may need to consider intubation   4:26 AM Improved mental status per RN. Appears comfortable and resting on camera check. COOX 53% Increase milrinone to 0.25 mcg/kg/min. Recheck COOX this am  Intervention Category Major Interventions: Respiratory failure - evaluation and management  Roshaun Pound Rodman Pickle 07/26/2021, 11:03 PM

## 2021-07-26 NOTE — Progress Notes (Signed)
Patient placed on BIPAP due to increased work of breathing. Patient currently tolerating well, RT will continue to monitor patient.

## 2021-07-26 NOTE — Progress Notes (Signed)
ANTICOAGULATION CONSULT NOTE  Pharmacy Consult for warfarin  Indication: mechanical MVR   No Known Allergies  Patient Measurements: Height: 5\' 9"  (175.3 cm) Weight: 93.2 kg (205 lb 7.5 oz) IBW/kg (Calculated) : 70.7  Vital Signs: Temp: 98.2 F (36.8 C) (02/10 0831) Temp Source: Oral (02/10 0831) BP: 114/85 (02/10 0800) Pulse Rate: 85 (02/10 0800)  Labs: Recent Labs    07/24/21 1614 07/25/21 0130 07/25/21 0149 07/25/21 0828 07/25/21 1334 07/26/21 0359 07/26/21 0929  HGB  --  9.1*   < > 9.2* 11.9* 9.7* 11.6*  HCT  --  28.3*   < > 29.2* 35.0* 30.5* 34.0*  PLT  --  261  --  273  --  267  --   LABPROT 78.7* 55.2*  --   --   --  44.2*  --   INR 9.8* 6.2*  --   --   --  4.7*  --   CREATININE  --  1.48*  --  1.27*  --  1.08  --    < > = values in this interval not displayed.     Estimated Creatinine Clearance: 109.7 mL/min (by C-G formula based on SCr of 1.08 mg/dL).   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 EF 35-40% by echo in 07/2018 b. EF at 45% by repeat echo in 04/2020   DVT (deep venous thrombosis) (HCC)    h/o   Dysrhythmia    Mitral regurgitation    a. 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 b. 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HCC)    arthritis   Tricuspid regurgitation     Assessment: 36 yo M present on 07/24/2021 with shortness of breath and found to have acute/chronic heart failure. Of note, recent history of mechanical MV replacement 12/22 at [facility]. On warfarin PTA with last dose 07/22/21. PTA regimen: MWF 4mg , STThS 3mg  (total weekly 24mg ). Pharmacy consulted  for warfarin management.   2/8 INR 9.8, supra-therapeutic, warfarin held 2/9 INR 6.2, supra-therapeutic, warfarin held  2/10 INR 4.7, supra-therapeutic   CBC stable, Scr improving, no overt s/s of bleeding. Hematuria/sediment noted in foley.  Goal of Therapy:  INR goal: 2.5-3.5 Monitor platelets by anticoagulation protocol: Yes   Plan:  Continue to hold warfarin today Daily INR   Thank you for allowing pharmacy to participate in this patient's care.  Levonne Spiller, PharmD PGY1 Acute Care Resident  07/26/2021,10:06 AM

## 2021-07-26 NOTE — Progress Notes (Signed)
? ?  Inpatient Rehab Admissions Coordinator : ? ?Per therapy recommendations, patient was screened for CIR candidacy by Taysia Rivere RN MSN.  At this time patient appears to be a potential candidate for CIR. I will place a rehab consult per protocol for full assessment. Please call me with any questions. ? ?Eleen Litz RN MSN ?Admissions Coordinator ?[phone] ?  ?

## 2021-07-26 NOTE — Progress Notes (Signed)
Patient seen on afternoon rounds (around 4:30 pm). Appeared tachypneic. CVP 12-13. Remained lethargic.  Increased Lasix to 80 mg BID and ordered 1 time dose metolazone 2.5 mg.

## 2021-07-26 NOTE — Progress Notes (Addendum)
Advanced Heart Failure Rounding Note  PCP-Cardiologist: Carlyle Dolly, MD   Subjective:   Admitted with A/C HFrEF/shock. Intubated.   Transferred to [facility] from [facility] for shock/HF.   2/8 Bld CX- NGTD 2/9 Extubated  Lactic acid 5.4>1.3 INR 9.8>6.2   CO-OX 65.5% on milrinone 0.125  CVP 13  Scr 1.48 > 1.27 > 1.08  K 3.0 and Mag 1.8  Weak this am. Denies CP or dyspnea. Reports burning around foley. Hematuria noted.   Objective:   Weight Range: 93.2 kg Body mass index is 30.34 kg/m.   Vital Signs:   Temp:  [96.8 F (36 C)-98.6 F (37 C)] 98.6 F (37 C) (02/10 0400) Pulse Rate:  [78-95] 83 (02/10 0700) Resp:  [14-42] 42 (02/10 0700) BP: (91-115)/(65-90) 106/84 (02/10 0400) SpO2:  [92 %-100 %] 95 % (02/10 0700) Arterial Line BP: (96-130)/(60-89) 127/89 (02/10 0700) FiO2 (%):  [40 %] 40 % (02/09 1042) Last BM Date:  (PTA)  Weight change: Filed Weights   07/25/21 0500  Weight: 93.2 kg    Intake/Output:   Intake/Output Summary (Last 24 hours) at 07/26/2021 0740 Last data filed at 07/26/2021 0700 Gross per 24 hour  Intake 2278.59 ml  Output 3000 ml  Net -721.41 ml      Physical Exam   CVP 13 General:  Appears weak. Resting comfortably in bed HEENT: normal Neck: supple. JVP 12-14 cm. Carotids 2+ bilat; no bruits. L IJ CVC with oozing Cor: PMI nondisplaced. Regular rate & rhythm. No rubs, gallops or murmurs. Lungs: clear Abdomen: soft, nontender, + distended. No hepatosplenomegaly. No bruits or masses. Good bowel sounds. Extremities: no cyanosis, clubbing, rash, trace edema. Dressings over b/l heal wounds, dressing on right hand Neuro: alert & orientedx3, cranial nerves grossly intact. moves all 4 extremities w/o difficulty. Affect pleasant    Telemetry   SR 80s-90s, rare PVCs  Labs    CBC Recent Labs    07/25/21 0828 07/25/21 1334 07/26/21 0359  WBC 9.7  --  8.9  HGB 9.2* 11.9* 9.7*  HCT 29.2* 35.0* 30.5*  MCV 78.5*  --  80.3  PLT 273   --  99991111   Basic Metabolic Panel Recent Labs    07/25/21 0828 07/25/21 1334 07/26/21 0359  NA 136 141 136  K 3.2* 3.4* 3.0*  CL 98  --  97*  CO2 31  --  33*  GLUCOSE 112*  --  140*  BUN 30*  --  18  CREATININE 1.27*  --  1.08  CALCIUM 8.2*  --  8.5*  MG 2.0  --  1.8  PHOS 2.5  --  2.2*   Liver Function Tests Recent Labs    07/25/21 0130 07/26/21 0359  AST 856* 842*  ALT 480* 632*  ALKPHOS 90 109  BILITOT 1.6* 1.4*  PROT 7.8 7.8  ALBUMIN 2.5* 2.5*   No results for input(s): LIPASE, AMYLASE in the last 72 hours. Cardiac Enzymes No results for input(s): CKTOTAL, CKMB, CKMBINDEX, TROPONINI in the last 72 hours.  BNP: BNP (last 3 results) Recent Labs    07/17/21 1320 07/22/21 0248 07/24/21 1433  BNP 1,686.0* 2,708.0* 3,184.1*    ProBNP (last 3 results) No results for input(s): PROBNP in the last 8760 hours.   D-Dimer No results for input(s): DDIMER in the last 72 hours. Hemoglobin A1C Recent Labs    07/24/21 1433  HGBA1C 6.4*   Fasting Lipid Panel No results for input(s): CHOL, HDL, LDLCALC, TRIG, CHOLHDL, LDLDIRECT in the last  72 hours. Thyroid Function Tests No results for input(s): TSH, T4TOTAL, T3FREE, THYROIDAB in the last 72 hours.  Invalid input(s): FREET3  Other results:   Imaging    No results found.   Medications:     Scheduled Medications:  Chlorhexidine Gluconate Cloth  6 each Topical Daily   docusate sodium  100 mg Oral BID   feeding supplement  237 mL Oral BID BM   Gerhardt's butt cream  1 application Topical TID   multivitamin with minerals  1 tablet Oral Daily   polyethylene glycol  17 g Oral Daily   sodium chloride flush  10-40 mL Intracatheter Q12H   sodium chloride flush  10-40 mL Intracatheter Q12H   Thrombi-Pad  1 each Topical Once   Warfarin - Pharmacist Dosing Inpatient   Does not apply q1600    Infusions:  sodium chloride 10 mL/hr at 07/26/21 0400   amiodarone 30 mg/hr (07/26/21 0400)   dextrose 30 mL/hr at  07/26/21 0400   milrinone 0.125 mcg/kg/min (07/26/21 0400)   potassium chloride 10 mEq (07/26/21 0728)   potassium PHOSPHATE IVPB (in mmol) 15 mmol (07/26/21 0602)    PRN Medications: sodium chloride, docusate sodium, Gerhardt's butt cream, polyethylene glycol, sodium chloride flush, sodium chloride flush    Patient Profile   36 y/o male with previous MV repair in 2019 due to torn chord with subsequent recurrent MR, chronic systolic HF due to biventricular HF with EF 30-35% in 3/22, PAF and recent hospitalization 12/22 for a/c CHF w/ low output in setting of severe MR, requiring stabilization w/ milrinone and transfer to [facility] for high risk mechanical Mitral valve replacement on 12/26. Now readmitted to [facility] for a/c CHF/shock and acute hypoxic respiratory failure requiring intubation.      Assessment/Plan  1. Acute on Chronic Biventricular Heart Failure>>Low Output Heart Failure   - BiV failure likely primarily valvular in nature in setting of severe MR, now s/p recent MV replacement 12/22 at [facility] EF 40 to 45% with severe MR with multiple jets including a possible paravalvular leak. Miderate RV dysfunction severe TR - [facility] 3/22 showed normal coronaries, NICM EF 30-35%, severe MR w/ prominent v-waves in PCWP tracing, mild pulmonary venous HTN with normal CO (Fick cardiac output/index = 5.3/2.5). - Referred for re-do MVR 4/22 but did not follow-up w/ Dr. Roxy Manns - Admit 12/22 for low output HF, required milrinone. Echo EF 20-25%, mild LV dilation, D-shaped septum, moderately decreased RV systolic function with mild RV enlargement, s/p MV repair with mean gradient 15 mmHg and severe MR, mild TR, dilated IVC - Transferred to [facility] and underwent mechanical MVR on 12/26 - Admitted w/ a/c CHF/shock.  Suspect acute decompensation due to recurrent Afib w/ RVR - Echo now shows LVEF <20%, RV severely reduced. Mechanical MV ok, mGradient 3 mmHg - CO-OX 65.5% on 0.125 milrinone. Continue  milrinone until diuresed - CVP 13. Diurese with IV lasix 40 mg BID. Remove foley d/t pain and mild hematuria. - Start spiro 12.5 mg daily - Dr. Haroldine Laws discussed with [facility] team. Remains tenuous. Depending on his progress, may need to consider transplant down the road if candidate. Will need to follow closely. Discussed with patients mother and sister (via phone) at bedside.   2. Shock - suspect primarily cardiogenic, initial co-ox 54% - Lactic acid 5.4> 1.3 .  - Co-ox 65.5% on 0.125 milrinone - Diuresis as above - Suspect likely stress related but given mechanical valve + buttock wounds - Blood cx pending.  3. Severe Mitral Valve Regurgitation s/p Mitral Valve Replacement  - previous MV repair in 2019 due to torn chord with subsequent recurrent MR - s/p mechanical MVR at [facility] 06/10/21 - repeat 2D echo w/ stable prothesis, mGradient 3 mmHg  - INR 9.6 on admit. Coumadin on hold. INR 4.7 today    4. Acute Hypoxic Respiratory Failure, S/p Intubation  - 2/2 acute pulmonary edema - vent management per PCCM  - Possible extubation.    5. Atrial Fibrillation w/ RVR  - in setting of long standing MV disease, echo w/ severe LAE,  LA diam: 5.90 cm - MAZE not done at time of MVR  - Maintaining SR.  - continue amio gtt 30/hr while on milrinone. Long Qtc > 600 ms on ECG last night. Recheck ECG today - INR supratherapeutic. Holding coumadin.    6. AKI  - baseline SCr ~1.0 - 1.89 on admit -->1.5 > 1.08 - suspect cardiorenal - support CO w/ milrinone + diurese w/ IV lasix - avoid hypotension, NE if needed  - follow BMP    7. Sacral Wounds  -  Multiple old and new pustules some bleeding.  Open areas on sacrum (see images in CCM note)  - WOC consult  - Blood CX NG so far   8. Supratherapeutic INR - INR 9.6 on admit.-->- Given FFP - INR down to 4.7 today - hold coumadin and monitor for bleeding  - w/ mechanical MV, would avoid Vit K for reversal unless significant bleeding   9.  Hypokalemia/hypomagnesemia - K 3.0, mag 1.8 - Supp today  10. Shock liver - LFTs slowly improving - Follow  11. Upper GI bleed - Dark OGT secretions.  - Gastroccult +  - Protonix per CCM - Hgb 9s, daily CBC  Mobilize. Up to chair today.  PT/OT  Length of Stay: 2  [name], Donald Parents, PA-C  07/26/2021, 7:40 AM  Advanced Heart Failure Team Pager [phone] (M-F; 7a - 5p)  Please contact [facility] for night-coverage after hours (5p -7a ) and weekends on [email or web address]   Agree with above.   Extubated yesterday. Lactic acidosis resolved with milrinone support. Co-ox improved. Has diuresed well but still SOB. Very weak. C/o discomfort from Foley  General:  Lying in bed. weak appearing. tachypneic HEENT: normal Neck: supple. JVP to jaw  Cor: Regular rate & rhythm. Mechanical s1 Lungs: clear Abdomen: obese soft, nontender, nondistended. No hepatosplenomegaly. No bruits or masses. Good bowel sounds. Extremities: no cyanosis, clubbing, rash, 1+ edema Neuro: alert & orientedx3, cranial nerves grossly intact. moves all 4 extremities w/o difficulty. Affect pleasant  Shock resolving but he remains very tenuous with severe biventricular dysfunction. Volume status elevated. Back in NSR on IV amio  It will be critical for him to remain in NSR.Continue amio. Hopefully we can get some myocardial recovery and be able to wean off milrinone but may be heading toward advanced therapies (if he is a candidate).   I discussed extensively with his mother and sister today. I have also d/w [facility] team.   CRITICAL CARE Performed by: Glori Bickers  Total critical care time: 45 minutes  Critical care time was exclusive of separately billable procedures and treating other patients.  Critical care was necessary to treat or prevent imminent or life-threatening deterioration.  Critical care was time spent personally by me (independent of midlevel providers or residents) on the following activities:  development of treatment plan with patient and/or surrogate as well as nursing, discussions with consultants, evaluation of  patient's response to treatment, examination of patient, obtaining history from patient or surrogate, ordering and performing treatments and interventions, ordering and review of laboratory studies, ordering and review of radiographic studies, pulse oximetry and re-evaluation of patient's condition.  Glori Bickers, MD  7:56 PM

## 2021-07-26 NOTE — Progress Notes (Signed)
K+ 3.0, Phos 2.2, Mg 1.8 Replaced per protocol

## 2021-07-26 NOTE — Progress Notes (Signed)
NAME:  Donald [name], MRN:  [id number], DOB:  [birth year], LOS: 2 ADMISSION DATE:  07/24/2021, CONSULTATION DATE:  2/8 REFERRING MD:  Dr Claybon Jabs EDP, CHIEF COMPLAINT:  Resp failure.    History of Present Illness:  36 year old male with mechanical MVR back in December at [place]. Since discharge he has had progressive dyspnea causing multiple ED presentations treated with diuresis and discharged on increased lasix and plans for follow up. Admitted to [facility] 2/6 with SOB and hallucinations. Intubated. Transferred to [facility] for ICU care and heart failure service consultation.   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   Significant Hospital Events: Including procedures, antibiotic start and stop dates in addition to other pertinent events   2/6 admit to [name] 2/8 Intubated, Echo with reduced EF 10%. Milrinone started.  2/9 Extubated   Echocardiogram LVEF < 20%, grade 2 DD. Severe RV systolic dysfunction. IVC dilated.   Interim History / Subjective:  Extubated yesterday, tolerating well.   Objective   Blood pressure 106/84, pulse 83, temperature 98.2 F (36.8 C), temperature source Oral, resp. rate (!) 42, height 5\' 9"  (1.753 m), weight 93.2 kg, SpO2 95 %. CVP:  [2 mmHg-13 mmHg] 13 mmHg  Vent Mode: CPAP;PSV FiO2 (%):  [40 %] 40 % PEEP:  [5 cmH20] 5 cmH20 Pressure Support:  [5 cmH20] 5 cmH20   Intake/Output Summary (Last 24 hours) at 07/26/2021 [id number] Last data filed at 07/26/2021 0700 Gross per 24 hour  Intake 2021.41 ml  Output 2775 ml  Net -753.59 ml    Filed Weights   07/25/21 0500  Weight: 93.2 kg     Examination:   General: Young adult male resting in bed HENT: [place]/AT, PERRL Lungs: Clear bilateral breath sounds Cardiovascular: Regular, click Abdomen: Soft, non-tender, non-distended Extremities: No acute deformity or edema.   Neuro: Lethargic, arouses to voice and follows commands only briefly.  GU: Foley, urine is blood tinged.  Skin: Multiple old and new pustules some bleeding.  Open areas on sacrum not consistent with pressure injuries.   Significant labs reviewed:K 3, Phos 2.2, Mag 1.8, AST 842, ALT 632, Tbili 1.4, Hemoglobin 9.7, INR 4.7   Resolved Hospital Problem list   Lactic acidosis VDRF  Assessment & Plan:   Acute hypoxic respiratory failure: Likely due to CHF exacerbation and pulmonary edema.  Less likely pneumonia, however, cannot rule out with leukocytosis and hypothermia. Extubated 2/9 - Supplemental O2 for sat goal 92-98%  Cardiogenic shock Acute on chronic HFrEF: LVEF 25% in December now < 20%.  - Management per heart failure service.  - Milrinone infusion - Diuresis per cardiology.  - May ultimately need transplant if deemed a candidate  S/p mitral valve replacement 12/26. Mechanical valve - Supratherapeutic INR - Daily INR check - Cardiology following  Acute metabolic encephalopathy - ABG, ammonia  Transaminitis: improving. Likely a result of HF exacerbation +/- shock - Trend LFT  Atrial fibrillation: now rate controlled. On presentation rates as high as 150s.  - Continue amiodarone infusion - Telemetry monitoring  Hypoglycemia - D10 off as diet started. CBG checks.   GI bleed upper - Dark OGT secretions. Gastroccult positive - Protonix BID - Trend CBC - Allow INR to correct naturally -  Hemoglobin fluctuating in the 9-11 range.   AKI:  - Trend BMP  Seronegative spondylitis Pyoderma gangrenosum - Previously followed by rheumatology at [facility].    Best Practice (right click and "Reselect all SmartList Selections" daily)    Diet/type: Regular consistency (see orders) DVT prophylaxis: Coumadin GI prophylaxis: PPI Lines: Central line, Arterial Line, and yes and it is still needed Foley:  removal ordered  Code Status:  full code Last date of multidisciplinary goals of care discussion [ ]     Critical care time: 36 minutes     Georgann Housekeeper, AGACNP-BC [place] for personal pager PCCM on call pager [phone] until 7pm. Please call [place] 7p-7a. [phone]  07/26/2021 8:38 AM

## 2021-07-26 NOTE — Evaluation (Signed)
Physical Therapy Evaluation Patient Details Name: Donald [name] MRN: [id number] DOB: 1986-06-12 Today's Date: 07/26/2021  History of Present Illness  Pt is a 36 y.o. male who presented to [facility] 2/7 for SOB, chest pain, palpitations, and AMS. Pt intubated and transferred to [facility] for ICU care and heart failure service consultation. Extubated 2/9. Pt admitted to [facility] for a/c CHF/shock and acute hypoxic respiratory failure. Of note, s/p mechanical MVR in December at [facility]. PMH: anemia, autoimmune disorder, CHF, DVT, dysrhythmia, mitral regurgitation, mitral stenosis, MI, PAF, pyoderma gangrenosa, seonegative spondylitis, tricuspid regurgitation   Clinical Impression  Pt presents with condition above and deficits mentioned below, see PT Problem List. PTA, he was independent without an AD. He was working prior to November of 2022 when he began to have some health issues and had mechanical MVR in December. Currently, pt displays deficits in cognition, balance, overall strength, and activity tolerance. He is at high risk for falls with poor safety awareness. Pt is currently requiring modA for bed mobility, transfers, and to take a few side steps with a RW. Due to pt's complex medical situation, young age, and motivation to improve he would be a great candidate for intensive therapy in the inpatient rehab setting. Will continue to follow acutely.       Recommendations for follow up therapy are one component of a multi-disciplinary discharge planning process, led by the attending physician.  Recommendations may be updated based on patient status, additional functional criteria and insurance authorization.  Follow Up Recommendations Acute inpatient rehab (3hours/day)    Assistance Recommended at Discharge Frequent or constant Supervision/Assistance  Patient can return home with the following  A lot of help with walking and/or transfers;A lot of help with bathing/dressing/bathroom;Assistance with  cooking/housework;Direct supervision/assist for medications management;Direct supervision/assist for financial management;Assist for transportation;Help with stairs or ramp for entrance    Equipment Recommendations Rolling walker (2 wheels);BSC/3in1  Recommendations for Other Services  Rehab consult    Functional Status Assessment Patient has had a recent decline in their functional status and demonstrates the ability to make significant improvements in function in a reasonable and predictable amount of time.     Precautions / Restrictions Precautions Precautions: Fall;Other (comment) Precaution Comments: A-line, monitor SpO2 Restrictions Weight Bearing Restrictions: No Other Position/Activity Restrictions: s/p MVR with sternotomy December 2022      Mobility  Bed Mobility Overal bed mobility: Needs Assistance Bed Mobility: Supine to Sit     Supine to sit: Mod assist, HOB elevated     General bed mobility comments: Extra time and verbal and tactile cues to bring legs off EOB, needing modA to elevate trunk and scoot hips to EOB.    Transfers Overall transfer level: Needs assistance Equipment used: Rolling walker (2 wheels) Transfers: Sit to/from Stand, Bed to chair/wheelchair/BSC Sit to Stand: Mod assist, From elevated surface   Step pivot transfers: Min assist (mod cues)       General transfer comment: Pt needing x2 attempts to stand from EOB with hands on RW despite cues to place on lap, modA to power up to stand, extend hips, and steady. MinA to cue weight shifting and steady pt with transfer stand step to L bed > recliner.    Ambulation/Gait Ambulation/Gait assistance: Min assist (mod cues) Gait Distance (Feet): 2 Feet Assistive device: Rolling walker (2 wheels) Gait Pattern/deviations: Decreased stride length, Decreased weight shift to right, Decreased weight shift to left, Shuffle, Trunk flexed Gait velocity: reduced Gait velocity interpretation: <1.31 ft/sec,  indicative of  household ambulator   General Gait Details: Pt with very slow, shuffling gait to step laterally to L bed > recliner with RW, needing tactile cues to weight shift and direct to chair.  Stairs            Wheelchair Mobility    Modified Rankin (Stroke Patients Only)       Balance Overall balance assessment: Needs assistance Sitting-balance support: No upper extremity supported, Feet supported Sitting balance-Leahy Scale: Fair Sitting balance - Comments: Static sitting EOB with supervision, difficulty obtaining upright posture with cues.   Standing balance support: Bilateral upper extremity supported, During functional activity, Reliant on assistive device for balance Standing balance-Leahy Scale: Poor Standing balance comment: Reliant on RW and external physical assistance.                             Pertinent Vitals/Pain Pain Assessment Pain Assessment: Faces Faces Pain Scale: Hurts little more Pain Location: generalized grimacing, but pt not responding to question where the pain is Pain Descriptors / Indicators: Discomfort, Grimacing Pain Intervention(s): Limited activity within patient's tolerance, Monitored during session, Repositioned    Home Living Family/patient expects to be discharged to:: Private residence Living Arrangements: Parent (mother and father) Available Help at Discharge: Family;Available 24 hours/day (mother on disability but can assist pt physically) Type of Home: House Home Access: Stairs to enter Entrance Stairs-Rails: Left (ascending) Entrance Stairs-Number of Steps: 4   Home Layout: One level Home Equipment: Grab bars - tub/shower;Shower seat      Prior Function Prior Level of Function : Independent/Modified Independent             Mobility Comments: Pt did not use an AD PTA and denies any recent falls. ADLs Comments: Prior to November 2022, he was working and IND for all ADLs, but since his recent surgery his  mtoher has assisted him PRN for bathing with pt sitting on shower seat.     Hand Dominance        Extremity/Trunk Assessment   Upper Extremity Assessment Upper Extremity Assessment: Defer to OT evaluation;Generalized weakness    Lower Extremity Assessment Lower Extremity Assessment: Generalized weakness;Difficult to assess due to impaired cognition (difficulty lifting legs into hip flexion against gravity; denies numbness/tingling bil)    Cervical / Trunk Assessment Cervical / Trunk Assessment: Kyphotic  Communication   Communication: Other (comment) (soft spoken)  Cognition Arousal/Alertness: Awake/alert Behavior During Therapy: Flat affect Overall Cognitive Status: Impaired/Different from baseline Area of Impairment: Orientation, Attention, Memory, Following commands, Safety/judgement, Awareness, Problem solving                 Orientation Level: Disoriented to, Situation Current Attention Level: Focused Memory: Decreased short-term memory, Decreased recall of precautions Following Commands: Follows one step commands inconsistently, Follows one step commands with increased time Safety/Judgement: Decreased awareness of safety, Decreased awareness of deficits Awareness: Intellectual Problem Solving: Slow processing, Decreased initiation, Difficulty sequencing, Requires verbal cues, Requires tactile cues General Comments: Pt with flat affect, needing extra time to process multi-modal simple cues and initiate tasks. Needs repeated cues and follows ~75% of simple cues.        General Comments General comments (skin integrity, edema, etc.): VSS on 2L O2 throughout, denies lightheadedness/dizziness; decreased skin integrity throughout, particularly the buttocks, supposedly from autoimmune disorder    Exercises     Assessment/Plan    PT Assessment Patient needs continued PT services  PT Problem List Decreased strength;Decreased range of  motion;Decreased activity  tolerance;Decreased balance;Decreased mobility;Decreased cognition;Decreased knowledge of use of DME;Decreased safety awareness;Cardiopulmonary status limiting activity;Decreased skin integrity       PT Treatment Interventions DME instruction;Gait training;Stair training;Functional mobility training;Therapeutic activities;Therapeutic exercise;Balance training;Neuromuscular re-education;Cognitive remediation;Patient/family education    PT Goals (Current goals can be found in the Care Plan section)  Acute Rehab PT Goals Patient Stated Goal: pt did not state, mother desires pt to improve PT Goal Formulation: With patient/family Time For Goal Achievement: 08/09/21 Potential to Achieve Goals: Good    Frequency Min 3X/week     Co-evaluation               AM-PAC PT "6 Clicks" Mobility  Outcome Measure Help needed turning from your back to your side while in a flat bed without using bedrails?: A Lot Help needed moving from lying on your back to sitting on the side of a flat bed without using bedrails?: A Lot Help needed moving to and from a bed to a chair (including a wheelchair)?: A Lot Help needed standing up from a chair using your arms (e.g., wheelchair or bedside chair)?: A Lot Help needed to walk in hospital room?: Total Help needed climbing 3-5 steps with a railing? : Total 6 Click Score: 10    End of Session Equipment Utilized During Treatment: Gait belt;Oxygen Activity Tolerance: Patient tolerated treatment well Patient left: in chair;with call bell/phone within reach;with chair alarm set;with family/visitor present Nurse Communication: Mobility status;Other (comment) (vitals) PT Visit Diagnosis: Unsteadiness on feet (R26.81);Other abnormalities of gait and mobility (R26.89);Muscle weakness (generalized) (M62.81);Difficulty in walking, not elsewhere classified (R26.2)    Time: 1133-1203 PT Time Calculation (min) (ACUTE ONLY): 30 min   Charges:   PT Evaluation $PT Eval  Moderate Complexity: 1 Mod PT Treatments $Therapeutic Activity: 8-22 mins        Moishe Spice, PT, DPT Acute Rehabilitation Services  Pager: [phone] Office: [phone]   Orvan Falconer 07/26/2021, 12:24 PM

## 2021-07-27 ENCOUNTER — Other Ambulatory Visit: Payer: Self-pay

## 2021-07-27 ENCOUNTER — Encounter (HOSPITAL_COMMUNITY): Payer: Self-pay | Admitting: Internal Medicine

## 2021-07-27 LAB — COMPREHENSIVE METABOLIC PANEL
ALT: 508 U/L — ABNORMAL HIGH (ref 0–44)
AST: 557 U/L — ABNORMAL HIGH (ref 15–41)
Albumin: 2.8 g/dL — ABNORMAL LOW (ref 3.5–5.0)
Alkaline Phosphatase: 122 U/L (ref 38–126)
Anion gap: 9 (ref 5–15)
BUN: 11 mg/dL (ref 6–20)
CO2: 32 mmol/L (ref 22–32)
Calcium: 8.8 mg/dL — ABNORMAL LOW (ref 8.9–10.3)
Chloride: 89 mmol/L — ABNORMAL LOW (ref 98–111)
Creatinine, Ser: 1.08 mg/dL (ref 0.61–1.24)
GFR, Estimated: >60 mL/min (ref >60)
Glucose, Bld: 110 mg/dL — ABNORMAL HIGH (ref 70–99)
Potassium: 4.2 mmol/L (ref 3.5–5.1)
Sodium: 130 mmol/L — ABNORMAL LOW (ref 135–145)
Total Bilirubin: 1.2 mg/dL (ref 0.3–1.2)
Total Protein: 8.9 g/dL — ABNORMAL HIGH (ref 6.5–8.1)

## 2021-07-27 LAB — GLUCOSE, CAPILLARY
Glucose-Capillary: 130 mg/dL — ABNORMAL HIGH (ref 70–99)
Glucose-Capillary: 131 mg/dL — ABNORMAL HIGH (ref 70–99)
Glucose-Capillary: 136 mg/dL — ABNORMAL HIGH (ref 70–99)
Glucose-Capillary: 153 mg/dL — ABNORMAL HIGH (ref 70–99)
Glucose-Capillary: 158 mg/dL — ABNORMAL HIGH (ref 70–99)
Glucose-Capillary: 176 mg/dL — ABNORMAL HIGH (ref 70–99)

## 2021-07-27 LAB — CBC
HCT: 31.6 % — ABNORMAL LOW (ref 39.0–52.0)
Hemoglobin: 10 g/dL — ABNORMAL LOW (ref 13.0–17.0)
MCH: 25 pg — ABNORMAL LOW (ref 26.0–34.0)
MCHC: 31.6 g/dL (ref 30.0–36.0)
MCV: 79 fL — ABNORMAL LOW (ref 80.0–100.0)
Platelets: 286 10*3/uL (ref 150–400)
RBC: 4 MIL/uL — ABNORMAL LOW (ref 4.22–5.81)
RDW: 15.7 % — ABNORMAL HIGH (ref 11.5–15.5)
WBC: 8.7 10*3/uL (ref 4.0–10.5)
nRBC: 0 % (ref 0.0–0.2)

## 2021-07-27 LAB — COOXEMETRY PANEL
Carboxyhemoglobin: 1.2 % (ref 0.5–1.5)
Carboxyhemoglobin: 1.1 % (ref 0.5–1.5)
Methemoglobin: 0.9 % (ref 0.0–1.5)
Methemoglobin: 0.9 % (ref 0.0–1.5)
O2 Saturation: 52.6 %
O2 Saturation: 67.8 %
Total hemoglobin: 10.1 g/dL — ABNORMAL LOW (ref 12.0–16.0)
Total hemoglobin: 10.2 g/dL — ABNORMAL LOW (ref 12.0–16.0)

## 2021-07-27 LAB — PROTIME-INR
INR: 3.5 — ABNORMAL HIGH (ref 0.8–1.2)
Prothrombin Time: 35.1 seconds — ABNORMAL HIGH (ref 11.4–15.2)

## 2021-07-27 LAB — PHOSPHORUS: Phosphorus: 2.8 mg/dL (ref 2.5–4.6)

## 2021-07-27 LAB — MAGNESIUM
Magnesium: 1.4 mg/dL — ABNORMAL LOW (ref 1.7–2.4)
Magnesium: 2 mg/dL (ref 1.7–2.4)

## 2021-07-27 LAB — POTASSIUM: Potassium: 3.4 mmol/L — ABNORMAL LOW (ref 3.5–5.1)

## 2021-07-27 MED ORDER — ENSURE ENLIVE PO LIQD
237.0000 mL | Freq: Two times a day (BID) | ORAL | Status: DC
  Administered 2021-07-28 (×2): 237 mL via ORAL

## 2021-07-27 MED ORDER — MILRINONE LACTATE IN DEXTROSE 20-5 MG/100ML-% IV SOLN
0.1250 ug/kg/min | INTRAVENOUS | Status: DC
  Administered 2021-07-27 – 2021-07-29 (×5): 0.25 ug/kg/min via INTRAVENOUS
  Administered 2021-07-29: 0.125 ug/kg/min via INTRAVENOUS
  Filled 2021-07-27 (×5): qty 100

## 2021-07-27 MED ORDER — POTASSIUM CHLORIDE 20 MEQ PO PACK: 40.0000 meq | PACK | Freq: Two times a day (BID) | ORAL | Status: DC

## 2021-07-27 MED ORDER — MAGNESIUM SULFATE 4 GM/100ML IV SOLN
4.0000 g | Freq: Once | INTRAVENOUS | Status: AC
  Administered 2021-07-27: 4 g via INTRAVENOUS
  Filled 2021-07-27: qty 100

## 2021-07-27 MED ORDER — POTASSIUM CHLORIDE CRYS ER 20 MEQ PO TBCR
40.0000 meq | EXTENDED_RELEASE_TABLET | ORAL | Status: AC
  Administered 2021-07-27 (×2): 40 meq via ORAL
  Filled 2021-07-27 (×2): qty 2

## 2021-07-27 MED ORDER — WARFARIN SODIUM 3 MG PO TABS
3.0000 mg | ORAL_TABLET | Freq: Once | ORAL | Status: AC
  Administered 2021-07-27: 3 mg via ORAL
  Filled 2021-07-27: qty 1

## 2021-07-27 NOTE — Progress Notes (Signed)
Patient resting comfortably off BiPAP at this time. Will continue to monitor.  °

## 2021-07-27 NOTE — Progress Notes (Signed)
ANTICOAGULATION CONSULT NOTE  Pharmacy Consult for warfarin  Indication: mechanical MVR   No Known Allergies  Patient Measurements: Height: 5\' 9"  (175.3 cm) Weight: 87.7 kg (193 lb 5.5 oz) IBW/kg (Calculated) : 70.7  Vital Signs: Temp: 97.6 F (36.4 C) (02/11 0740) Temp Source: Oral (02/11 0740) BP: 109/89 (02/11 0800) Pulse Rate: 84 (02/11 0800)  Labs: Recent Labs    07/25/21 0130 07/25/21 0149 07/25/21 0828 07/25/21 1334 07/26/21 0359 07/26/21 0929 07/26/21 1228 07/26/21 1607 07/26/21 2310 07/27/21 0320  HGB 9.1*   < > 9.2*   < > 9.7*   < >  --  11.9* 11.9* 10.0*  HCT 28.3*   < > 29.2*   < > 30.5*   < >  --  35.0* 35.0* 31.6*  PLT 261  --  273  --  267  --   --   --   --  286  LABPROT 55.2*  --   --   --  44.2*  --   --   --   --  35.1*  INR 6.2*  --   --   --  4.7*  --   --   --   --  3.5*  CREATININE 1.48*  --  1.27*  --  1.08  --  1.05  --   --  1.08   < > = values in this interval not displayed.     Estimated Creatinine Clearance: 106.6 mL/min (by C-G formula based on SCr of 1.08 mg/dL).   Medical History: Past Medical History:  Diagnosis Date   Anemia    Autoimmune disorder ([place])    pyoderma gangrenosum   CHF (congestive heart failure) ([place])    Chronic systolic heart failure ([place])    a. EF 35-40% by echo in 07/2018 b. EF at 45% by repeat echo in 04/2020   DVT (deep venous thrombosis) ([facility])    h/o   Dysrhythmia    Mitral regurgitation    a. 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 b. 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H[facility])    arthritis   Tricuspid regurgitation     Assessment: 36 yo M present on 07/24/2021 with shortness of breath and found to have acute/chronic heart failure. Of note, recent history of mechanical MV  replacement 12/22 at [facility]. On warfarin PTA with last dose 07/22/21. INR was 9.8 on admit, FFP given 2/8. PTA regimen: MWF 4mg , STThS 3mg  (total weekly 24mg ). Pharmacy consulted for warfarin management.   INR today is within therapeutic range at 3.5, H/H stable. No hematuria.  Goal of Therapy:  INR goal: 2.5-3.5 Monitor platelets by anticoagulation protocol: Yes   Plan:  -Warfarin 3mg  PO x1 tonight -Daily INR  Arrie Senate, PharmD, BCPS, [facility] Clinical Pharmacist [phone] Please check AMION for all [facility] Pharmacy numbers 07/27/2021

## 2021-07-27 NOTE — Progress Notes (Signed)
Inpatient Rehab Admissions Coordinator:  Attempted to meet pt. Another discipline was in the room. Will continue to follow.  Wolfgang Phoenix, MS, CCC-SLP Admissions Coordinator [phone]

## 2021-07-27 NOTE — Progress Notes (Signed)
Advanced Heart Failure Rounding Note  PCP-Cardiologist: Dina Rich, MD   Subjective:   Admitted with A/C HFrEF/shock. Intubated.   Transferred to [facility] from [facility] for shock/HF.   2/8 Bld CX- NGTD 2/9 Extubated  Lactic acid 5.4>1.3 INR 9.8>6.2   Remains on milrinone 0.25. Co-ox 68% Diuresed well overnight on IV lasix and metolazone. Out 8.1L. Weight down 12 pounds. CVP 5-6 (checked personally)  Sleepy but arousable. Periods of tachypnea. Denies SOB   Objective:   Weight Range: 87.7 kg Body mass index is 28.55 kg/m.   Vital Signs:   Temp:  [97.6 F (36.4 C)-98.7 F (37.1 C)] 97.8 F (36.6 C) (02/11 1107) Pulse Rate:  [67-91] 84 (02/11 1100) Resp:  [18-42] 28 (02/11 1100) BP: (103-120)/(84-95) 109/89 (02/11 0800) SpO2:  [94 %-100 %] 100 % (02/11 1100) Arterial Line BP: (96-117)/(69-80) 108/72 (02/11 1100) FiO2 (%):  [40 %] 40 % (02/10 2309) Weight:  [87.7 kg] 87.7 kg (02/11 0500) Last BM Date:  (PTA)  Weight change: Filed Weights   07/25/21 0500 07/27/21 0500  Weight: 93.2 kg 87.7 kg    Intake/Output:   Intake/Output Summary (Last 24 hours) at 07/27/2021 1134 Last data filed at 07/27/2021 1100 Gross per 24 hour  Intake 2126.22 ml  Output 9975 ml  Net -7848.78 ml       Physical Exam    General:  Lethargic but arousable. Guppy breathing  HEENT: normal Neck: supple. no JVD. Carotids 2+ bilat; no bruits. No lymphadenopathy or thryomegaly appreciated. Cor: PMI laterally displaced. Regular rate & rhythm. Mechanical s1 Lungs: clear Abdomen: soft, nontender, nondistended. No hepatosplenomegaly. No bruits or masses. Good bowel sounds. Extremities: no cyanosis, clubbing, rash, edema Neuro: lethargic but arousable. Moves all 4   Telemetry   SR 80-90s + PVCs. Personally reviewed  Labs    CBC Recent Labs    07/26/21 0359 07/26/21 0929 07/26/21 2310 07/27/21 0320  WBC 8.9  --   --  8.7  HGB 9.7*   < > 11.9* 10.0*  HCT 30.5*   < > 35.0* 31.6*   MCV 80.3  --   --  79.0*  PLT 267  --   --  286   < > = values in this interval not displayed.    Basic Metabolic Panel Recent Labs    84/13/24 0359 07/26/21 0929 07/26/21 1228 07/26/21 1607 07/26/21 2310 07/27/21 0320  NA 136   < > 133*   < > 132* 130*  K 3.0*   < > 3.9   < > 4.3 4.2  CL 97*  --  96*  --   --  89*  CO2 33*  --  29  --   --  32  GLUCOSE 140*  --  149*  --   --  110*  BUN 18  --  14  --   --  11  CREATININE 1.08  --  1.05  --   --  1.08  CALCIUM 8.5*  --  8.5*  --   --  8.8*  MG 1.8  --   --   --   --  1.4*  PHOS 2.2*  --   --   --   --  2.8   < > = values in this interval not displayed.    Liver Function Tests Recent Labs    07/26/21 0359 07/27/21 0320  AST 842* 557*  ALT 632* 508*  ALKPHOS 109 122  BILITOT 1.4* 1.2  PROT 7.8  8.9*  ALBUMIN 2.5* 2.8*    No results for input(s): LIPASE, AMYLASE in the last 72 hours. Cardiac Enzymes No results for input(s): CKTOTAL, CKMB, CKMBINDEX, TROPONINI in the last 72 hours.  BNP: BNP (last 3 results) Recent Labs    07/17/21 1320 07/22/21 0248 07/24/21 1433  BNP 1,686.0* 2,708.0* 3,184.1*     ProBNP (last 3 results) No results for input(s): PROBNP in the last 8760 hours.   D-Dimer No results for input(s): DDIMER in the last 72 hours. Hemoglobin A1C Recent Labs    07/24/21 1433  HGBA1C 6.4*    Fasting Lipid Panel No results for input(s): CHOL, HDL, LDLCALC, TRIG, CHOLHDL, LDLDIRECT in the last 72 hours. Thyroid Function Tests No results for input(s): TSH, T4TOTAL, T3FREE, THYROIDAB in the last 72 hours.  Invalid input(s): FREET3  Other results:   Imaging    No results found.   Medications:     Scheduled Medications:  Chlorhexidine Gluconate Cloth  6 each Topical Daily   docusate sodium  100 mg Oral BID   feeding supplement  237 mL Oral BID BM   furosemide  80 mg Intravenous BID   Gerhardt's butt cream  1 application Topical TID   multivitamin with minerals  1 tablet Oral  Daily   pantoprazole  40 mg Oral Daily   polyethylene glycol  17 g Oral Daily   sodium chloride flush  10-40 mL Intracatheter Q12H   sodium chloride flush  10-40 mL Intracatheter Q12H   spironolactone  12.5 mg Oral Daily   warfarin  3 mg Oral ONCE-1600   Warfarin - Pharmacist Dosing Inpatient   Does not apply q1600    Infusions:  sodium chloride Stopped (07/26/21 0532)   amiodarone 30 mg/hr (07/27/21 1100)   milrinone 0.25 mcg/kg/min (07/27/21 1100)    PRN Medications: sodium chloride, docusate sodium, Gerhardt's butt cream, polyethylene glycol, sodium chloride flush, sodium chloride flush    Patient Profile   36 y/o male with previous MV repair in 2019 due to torn chord with subsequent recurrent MR, chronic systolic HF due to biventricular HF with EF 30-35% in 3/22, PAF and recent hospitalization 12/22 for a/c CHF w/ low output in setting of severe MR, requiring stabilization w/ milrinone and transfer to [facility] for high risk mechanical Mitral valve replacement on 12/26. Now readmitted to [facility] for a/c CHF/shock and acute hypoxic respiratory failure requiring intubation.      Assessment/Plan  1. Acute on Chronic Biventricular Heart Failure>>Low Output Heart Failure   - BiV failure likely primarily valvular in nature in setting of severe MR, now s/p recent MV replacement 12/22 at [place] EF 40 to 45% with severe MR with multiple jets including a possible paravalvular leak. Miderate RV dysfunction severe TR - [facility] 3/22 showed normal coronaries, NICM EF 30-35%, severe MR w/ prominent v-waves in PCWP tracing, mild pulmonary venous HTN with normal CO (Fick cardiac output/index = 5.3/2.5). - Referred for re-do MVR 4/22 but did not follow-up w/ Dr. Roxy Manns - Admit 12/22 for shock. Echo EF 20-25%, mild LV dilation, D-shaped septum, moderately decreased RV systolic function with mild RV enlargement, s/p MV repair with mean gradient 15 mmHg and severe MR, mild TR, dilated IVC - Transferred  to [facility] and underwent mechanical MVR on 12/26 - Admitted w/ a/c CHF/shock.  Suspect acute decompensation due to recurrent Afib w/ RVR - Echo now shows LVEF <20%, RV severely reduced. Mechanical MV ok, mGradient 3 mmHg - CO-OX 68% on 0.25 milrinone.  -  Excellent diuresis overnight CVP 5-6. Will give one more dose of IV lasix then stop.  - Continue spiro.  - Add digoxin - ARB/ARNI and SGLT2i soon  - Discussed with [facility] team. Remains tenuous. Depending on his progress, may need to consider VAD/transplant down the road if candidate. Will need to follow closely. Discussed with patients sister at bedside.   2. Severe Mitral Valve Regurgitation s/p Mitral Valve Replacement  - previous MV repair in 2019 due to torn chord with subsequent recurrent MR - s/p mechanical MVR at [facility] 06/10/21 - repeat 2D echo w/ stable prothesis, mGradient 3 mmHg  - INR 9.6 on admit. Coumadin on hold. INR 3.5 today    3. Acute Hypoxic Respiratory Failure, S/p Intubation  - 2/2 acute pulmonary edema - extubated 2/9   4. Atrial Fibrillation w/ RVR  - in setting of long standing MV disease, echo w/ severe LAE,  LA diam: 5.90 cm - Maintaining SR.  - continue amio gtt 30/hr while on milrinone. - INR supratherapeutic. Holding coumadin. Restart today.  Discussed dosing with PharmD personally.   5. AKI  - baseline SCr ~1.0 - 1.89 on admit -->1.5 > 1.08 - suspect cardiorenal - support CO w/ milrinone  - avoid hypotension, NE if needed  - follow BMP    6. Sacral Wounds  -  Multiple old and new pustules some bleeding. Open areas on sacrum (see images in CCM note)  - WOC consult   7. Hypokalemia/hypomagnesemia - K 4.2, mag 1.6 - Supp today  8. Shock liver - LFTs s improving - Follow  9. Upper GI bleed - Dark OGT secretions.  - Gastroccult +  - Continue PPI - Hgb table at 10  CRITICAL CARE Performed by: Glori Bickers  Total critical care time: 45 minutes  Critical care time was exclusive of  separately billable procedures and treating other patients.  Critical care was necessary to treat or prevent imminent or life-threatening deterioration.  Critical care was time spent personally by me (independent of midlevel providers or residents) on the following activities: development of treatment plan with patient and/or surrogate as well as nursing, discussions with consultants, evaluation of patient's response to treatment, examination of patient, obtaining history from patient or surrogate, ordering and performing treatments and interventions, ordering and review of laboratory studies, ordering and review of radiographic studies, pulse oximetry and re-evaluation of patient's condition.  Length of Stay: 3  Glori Bickers, MD  07/27/2021, 11:34 AM  Advanced Heart Failure Team Pager [phone] (M-F; 7a - 5p)  Please contact [facility] for night-coverage after hours (5p -7a ) and weekends on [email or web address]

## 2021-07-27 NOTE — Progress Notes (Signed)
Mg 1.4 Replaced per protocol  

## 2021-07-28 LAB — RETICULOCYTES
Immature Retic Fract: 20.1 % — ABNORMAL HIGH (ref 2.3–15.9)
RBC.: 4.33 MIL/uL (ref 4.22–5.81)
Retic Count, Absolute: 60.6 10*3/uL (ref 19.0–186.0)
Retic Ct Pct: 1.4 % (ref 0.4–3.1)

## 2021-07-28 LAB — GLUCOSE, CAPILLARY
Glucose-Capillary: 126 mg/dL — ABNORMAL HIGH (ref 70–99)
Glucose-Capillary: 168 mg/dL — ABNORMAL HIGH (ref 70–99)
Glucose-Capillary: 162 mg/dL — ABNORMAL HIGH (ref 70–99)
Glucose-Capillary: 112 mg/dL — ABNORMAL HIGH (ref 70–99)
Glucose-Capillary: 149 mg/dL — ABNORMAL HIGH (ref 70–99)

## 2021-07-28 LAB — IRON AND TIBC
Iron: 43 ug/dL — ABNORMAL LOW (ref 45–182)
Saturation Ratios: 9 % — ABNORMAL LOW (ref 17.9–39.5)
TIBC: 459 ug/dL — ABNORMAL HIGH (ref 250–450)
UIBC: 416 ug/dL

## 2021-07-28 LAB — COMPREHENSIVE METABOLIC PANEL
ALT: 380 U/L — ABNORMAL HIGH (ref 0–44)
AST: 378 U/L — ABNORMAL HIGH (ref 15–41)
Albumin: 3 g/dL — ABNORMAL LOW (ref 3.5–5.0)
Alkaline Phosphatase: 145 U/L — ABNORMAL HIGH (ref 38–126)
Anion gap: 9 (ref 5–15)
BUN: 17 mg/dL (ref 6–20)
CO2: 32 mmol/L (ref 22–32)
Calcium: 9.2 mg/dL (ref 8.9–10.3)
Chloride: 84 mmol/L — ABNORMAL LOW (ref 98–111)
Creatinine, Ser: 1.14 mg/dL (ref 0.61–1.24)
GFR, Estimated: >60 mL/min (ref >60)
Glucose, Bld: 120 mg/dL — ABNORMAL HIGH (ref 70–99)
Potassium: 3.9 mmol/L (ref 3.5–5.1)
Sodium: 125 mmol/L — ABNORMAL LOW (ref 135–145)
Total Bilirubin: 1.3 mg/dL — ABNORMAL HIGH (ref 0.3–1.2)
Total Protein: 9.5 g/dL — ABNORMAL HIGH (ref 6.5–8.1)

## 2021-07-28 LAB — FERRITIN: Ferritin: 23 ng/mL — ABNORMAL LOW (ref 24–336)

## 2021-07-28 LAB — CBC
HCT: 33 % — ABNORMAL LOW (ref 39.0–52.0)
Hemoglobin: 10.7 g/dL — ABNORMAL LOW (ref 13.0–17.0)
MCH: 25 pg — ABNORMAL LOW (ref 26.0–34.0)
MCHC: 32.4 g/dL (ref 30.0–36.0)
MCV: 77.1 fL — ABNORMAL LOW (ref 80.0–100.0)
Platelets: 307 10*3/uL (ref 150–400)
RBC: 4.28 MIL/uL (ref 4.22–5.81)
RDW: 15.8 % — ABNORMAL HIGH (ref 11.5–15.5)
WBC: 8.6 10*3/uL (ref 4.0–10.5)
nRBC: 0 % (ref 0.0–0.2)

## 2021-07-28 LAB — COOXEMETRY PANEL
Carboxyhemoglobin: 1.1 % (ref 0.5–1.5)
Methemoglobin: 0.9 % (ref 0.0–1.5)
O2 Saturation: 67.2 %
Total hemoglobin: 10.8 g/dL — ABNORMAL LOW (ref 12.0–16.0)

## 2021-07-28 LAB — MAGNESIUM: Magnesium: 1.8 mg/dL (ref 1.7–2.4)

## 2021-07-28 LAB — PROTIME-INR
INR: 2.5 — ABNORMAL HIGH (ref 0.8–1.2)
Prothrombin Time: 27.3 seconds — ABNORMAL HIGH (ref 11.4–15.2)

## 2021-07-28 LAB — FOLATE: Folate: 16.1 ng/mL (ref >5.9)

## 2021-07-28 LAB — VITAMIN B12: Vitamin B-12: 2046 pg/mL — ABNORMAL HIGH (ref 180–914)

## 2021-07-28 MED ORDER — FUROSEMIDE 10 MG/ML IJ SOLN
80.0000 mg | Freq: Two times a day (BID) | INTRAMUSCULAR | Status: DC
  Administered 2021-07-28 – 2021-07-29 (×2): 80 mg via INTRAVENOUS
  Filled 2021-07-28 (×2): qty 8

## 2021-07-28 MED ORDER — WARFARIN SODIUM 5 MG PO TABS
5.0000 mg | ORAL_TABLET | Freq: Once | ORAL | Status: AC
  Administered 2021-07-28: 5 mg via ORAL
  Filled 2021-07-28: qty 1

## 2021-07-28 MED ORDER — MAGNESIUM SULFATE 2 GM/50ML IV SOLN
2.0000 g | Freq: Once | INTRAVENOUS | Status: AC
  Administered 2021-07-28: 2 g via INTRAVENOUS
  Filled 2021-07-28: qty 50

## 2021-07-28 MED ORDER — POTASSIUM CHLORIDE CRYS ER 20 MEQ PO TBCR
40.0000 meq | EXTENDED_RELEASE_TABLET | Freq: Once | ORAL | Status: AC
  Administered 2021-07-28: 40 meq via ORAL
  Filled 2021-07-28: qty 2

## 2021-07-28 MED ORDER — LOSARTAN POTASSIUM 25 MG PO TABS
12.5000 mg | ORAL_TABLET | Freq: Two times a day (BID) | ORAL | Status: DC
  Administered 2021-07-28 – 2021-07-29 (×3): 12.5 mg via ORAL
  Filled 2021-07-28 (×4): qty 1

## 2021-07-28 NOTE — Progress Notes (Signed)
Inpatient Rehab Admissions:  Inpatient Rehab Consult received.  I met with patient and mother at the bedside for rehabilitation assessment and to discuss goals and expectations of an inpatient rehab admission.  Both acknowledged understanding of CIR goals and expectations. Both interested in pt pursuing CIR. Mother confirmed that she will be able to provide 24/7 supervision/assistance for pt after discharge.  Awaiting OT evaluation. Will continue to follow.  Signed: Gayland Curry, [place], [place] Admissions Coordinator [phone]

## 2021-07-28 NOTE — Progress Notes (Signed)
ANTICOAGULATION CONSULT NOTE  Pharmacy Consult for warfarin  Indication: mechanical MVR   No Known Allergies  Patient Measurements: Height: 5\' 9"  (175.3 cm) Weight: 87 kg (191 lb 12.8 oz) IBW/kg (Calculated) : 70.7  Vital Signs: Temp: 98.1 F (36.7 C) (02/12 1119) Temp Source: Oral (02/12 1119) BP: 110/86 (02/12 0900) Pulse Rate: 87 (02/12 0900)  Labs: Recent Labs    07/26/21 0359 07/26/21 0929 07/26/21 1228 07/26/21 1607 07/26/21 2310 07/27/21 0320 07/28/21 0400  HGB 9.7*   < >  --    < > 11.9* 10.0* 10.7*  HCT 30.5*   < >  --    < > 35.0* 31.6* 33.0*  PLT 267  --   --   --   --  286 307  LABPROT 44.2*  --   --   --   --  35.1* 27.3*  INR 4.7*  --   --   --   --  3.5* 2.5*  CREATININE 1.08  --  1.05  --   --  1.08 1.14   < > = values in this interval not displayed.     Estimated Creatinine Clearance: 100.6 mL/min (by C-G formula based on SCr of 1.14 mg/dL).   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 EF 35-40% by echo in 07/2018 b. EF at 45% by repeat echo in 04/2020   DVT (deep venous thrombosis) (H[facility])    h/o   Dysrhythmia    Mitral regurgitation    a. 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 b. 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HCC)    arthritis   Tricuspid regurgitation     Assessment: 36 yo M present on 07/24/2021 with shortness of breath and found to have acute/chronic heart failure. Of note, recent history of mechanical MV replacement 12/22 at [facility]. On warfarin PTA with last dose 07/22/21. INR was 9.8 on admit, FFP given 2/8. PTA regimen: MWF 4mg , STThS 3mg  (total  weekly 24mg ). Pharmacy consulted for warfarin management.   INR today is within therapeutic range at 2.5, H/H ok. Will boost to prevent subtherapeutic drop.  Goal of Therapy:  INR goal: 2.5-3.5 Monitor platelets by anticoagulation protocol: Yes   Plan:  -Warfarin 5mg  PO x1 tonight -Daily INR  Arrie Senate, PharmD, BCPS, [facility] Clinical Pharmacist [phone] Please check AMION for all [facility] Pharmacy numbers 07/28/2021

## 2021-07-28 NOTE — PMR Pre-admission (Incomplete)
PMR Admission Coordinator Pre-Admission Assessment  Patient: Donald [name] is an 36 y.o., male MRN: [id number] DOB: 1986/01/17 Height: 5\' 9"  (175.3 cm) Weight: 87 kg  Insurance Information HMO:     PPO:      PCP:      IPA:      80/20:      OTHER:  PRIMARY: UNINSURED      Policy#:       Subscriber:  CM Name:       Phone#:      Fax#:  Pre-Cert#:       Employer:  Benefits:  Phone #:      Name:  Eff. Date:      Deduct:       Out of Pocket Max:       Life Max:  CIR:       SNF:  Outpatient:      Co-Pay:  Home Health:       Co-Pay:  DME:      Co-Pay:  Providers:  SECONDARY:       Policy#:      Phone#:   Development worker, community:       Phone#:   [employer] for patients in Inpatient Rehabilitation Facilities with attached Privacy Act [place] Records was provided and verbally reviewed with: N/A  Emergency Contact Information Contact Information     Name Relation Home Work Mobile   [place] Father [place] Mother   [phone]       Current Medical History  Patient Admitting Diagnosis: Hypoxemic respiratory failure and acute on chronic CHF/shock History of Present Illness: ***    Patient's medical record from [facility] has been reviewed by the rehabilitation admission coordinator and physician.  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 EF 35-40% by echo in 07/2018 b. EF at 45% by repeat echo in 04/2020   DVT (deep venous thrombosis) (HCC)    h/o   Dysrhythmia    Mitral regurgitation    a. 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 b. 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   Mitral stenosis    Myocardial infarction (HCC)     Paroxysmal atrial flutter (HCC)    Pyoderma gangrenosa    Seronegative spondylitis (HCC)    arthritis   Tricuspid regurgitation     Has the patient had major surgery during 100 days prior to admission? Yes  Family History   family history includes Depression in his father; Diabetes in his father and paternal grandmother; Multiple sclerosis in his mother; Psoriasis in his mother.  Current Medications  Current Facility-Administered Medications:    0.9 %  sodium chloride infusion, , Intravenous, PRN, Corey Harold, NP, Stopped at 07/26/21 0532   amiodarone (NEXTERONE PREMIX) 360-4.14 MG/200ML-% (1.8 mg/mL) IV infusion, 30 mg/hr, Intravenous, Continuous, Corey Harold, NP, Last Rate: 16.67 mL/hr at 07/28/21 1223, 30 mg/hr at 07/28/21 1223   Chlorhexidine Gluconate Cloth 2 % PADS 6 each, 6 each, Topical, Daily, Candee Furbish, MD, 6 each at 07/28/21 1129   docusate sodium (COLACE) capsule 100 mg, 100 mg, Oral, BID PRN, Corey Harold, NP   docusate sodium (COLACE) capsule 100 mg,  100 mg, Oral, BID, Candee Furbish, MD, 100 mg at 07/28/21 1003   feeding supplement (ENSURE ENLIVE / ENSURE PLUS) liquid 237 mL, 237 mL, Oral, BID BM, Bensimhon, Shaune Pascal, MD, 237 mL at 07/28/21 1004   Gerhardt's butt cream 1 application, 1 application, Topical, TID, Candee Furbish, MD, 1 application at [id number]   Gerhardt's butt cream 1 application, 1 application, Topical, PRN, Candee Furbish, MD   milrinone (PRIMACOR) 20 MG/100 ML (0.2 mg/mL) infusion, 0.25 mcg/kg/min, Intravenous, Continuous, Margaretha Seeds, MD, Last Rate: 6.99 mL/hr at 07/28/21 0900, 0.25 mcg/kg/min at 07/28/21 0900   multivitamin with minerals tablet 1 tablet, 1 tablet, Oral, Daily, Candee Furbish, MD, 1 tablet at 07/28/21 1003   pantoprazole (PROTONIX) EC tablet 40 mg, 40 mg, Oral, Daily, Lyndee Leo, RPH, 40 mg at 07/28/21 1002   polyethylene glycol (MIRALAX / GLYCOLAX) packet 17 g, 17 g, Oral, Daily, Candee Furbish, MD, 17  g at 07/28/21 1003   polyethylene glycol (MIRALAX / GLYCOLAX) packet 17 g, 17 g, Oral, Daily PRN, Candee Furbish, MD, 17 g at 07/27/21 1819   sodium chloride flush (NS) 0.9 % injection 10-40 mL, 10-40 mL, Intracatheter, Q12H, Candee Furbish, MD, 10 mL at 07/28/21 1005   sodium chloride flush (NS) 0.9 % injection 10-40 mL, 10-40 mL, Intracatheter, PRN, Candee Furbish, MD   sodium chloride flush (NS) 0.9 % injection 10-40 mL, 10-40 mL, Intracatheter, Q12H, Candee Furbish, MD, 10 mL at 07/28/21 1005   sodium chloride flush (NS) 0.9 % injection 10-40 mL, 10-40 mL, Intracatheter, PRN, Candee Furbish, MD   spironolactone (ALDACTONE) tablet 12.5 mg, 12.5 mg, Oral, Daily, Joette Catching, PA-C, 12.5 mg at 07/28/21 1003   warfarin (COUMADIN) tablet 5 mg, 5 mg, Oral, ONCE-1600, Einar Grad, [facility]   Warfarin - Pharmacist Dosing Inpatient, , Does not apply, q1600, Levonne Spiller, [facility], Given at 07/27/21 1537  Patients Current Diet:  Diet Order             Diet regular Room service appropriate? Yes; Fluid consistency: Thin  Diet effective now                   Precautions / Restrictions Precautions Precautions: Fall, Other (comment) Precaution Comments: A-line, monitor SpO2 Restrictions Weight Bearing Restrictions: No Other Position/Activity Restrictions: s/p MVR with sternotomy December 2022   Has the patient had 2 or more falls or a fall with injury in the past year? Yes  Prior Activity Level Community (5-7x/wk): worked, got out of house daily prior to last hospitalization  Prior Functional Level Self Care: Did the patient need help bathing, dressing, using the toilet or eating? Needed some help  Indoor Mobility: Did the patient need assistance with walking from room to room (with or without device)? Independent  Stairs: Did the patient need assistance with internal or external stairs (with or without device)? Needed some help  Functional Cognition: Did the patient need  help planning regular tasks such as shopping or remembering to take medications? Needed some help  Patient Information    Patient's Response To:     Home Assistive Devices / Equipment Home Assistive Devices/Equipment: None Home Equipment: Grab bars - tub/shower, Shower seat  Prior Device Use: Indicate devices/aids used by the patient prior to current illness, exacerbation or injury? None of the above  Current Functional Level Cognition  Overall Cognitive Status: Impaired/Different from baseline Current Attention Level: Focused Orientation Level: Oriented  X4 Following Commands: Follows one step commands inconsistently, Follows one step commands with increased time Safety/Judgement: Decreased awareness of safety, Decreased awareness of deficits General Comments: Pt with flat affect, needing extra time to process multi-modal simple cues and initiate tasks. Needs repeated cues and follows ~75% of simple cues.    Extremity Assessment (includes Sensation/Coordination)  Upper Extremity Assessment: Defer to OT evaluation, Generalized weakness  Lower Extremity Assessment: Generalized weakness, Difficult to assess due to impaired cognition (difficulty lifting legs into hip flexion against gravity; denies numbness/tingling bil)    ADLs       Mobility  Overal bed mobility: Needs Assistance Bed Mobility: Supine to Sit Supine to sit: Mod assist, HOB elevated General bed mobility comments: Extra time and verbal and tactile cues to bring legs off EOB, needing modA to elevate trunk and scoot hips to EOB.    Transfers  Overall transfer level: Needs assistance Equipment used: Rolling walker (2 wheels) Transfers: Sit to/from Stand, Bed to chair/wheelchair/BSC Sit to Stand: Mod assist, From elevated surface Bed to/from chair/wheelchair/BSC transfer type:: Step pivot Step pivot transfers: Min assist (mod cues) General transfer comment: Pt needing x2 attempts to stand from EOB with hands on RW  despite cues to place on lap, modA to power up to stand, extend hips, and steady. MinA to cue weight shifting and steady pt with transfer stand step to L bed > recliner.    Ambulation / Gait / Stairs / Wheelchair Mobility  Ambulation/Gait Ambulation/Gait assistance: Min assist (mod cues) Gait Distance (Feet): 2 Feet Assistive device: Rolling walker (2 wheels) Gait Pattern/deviations: Decreased stride length, Decreased weight shift to right, Decreased weight shift to left, Shuffle, Trunk flexed General Gait Details: Pt with very slow, shuffling gait to step laterally to L bed > recliner with RW, needing tactile cues to weight shift and direct to chair. Gait velocity: reduced Gait velocity interpretation: <1.31 ft/sec, indicative of household ambulator    Posture / Balance Dynamic Sitting Balance Sitting balance - Comments: Static sitting EOB with supervision, difficulty obtaining upright posture with cues. Balance Overall balance assessment: Needs assistance Sitting-balance support: No upper extremity supported, Feet supported Sitting balance-Leahy Scale: Fair Sitting balance - Comments: Static sitting EOB with supervision, difficulty obtaining upright posture with cues. Standing balance support: Bilateral upper extremity supported, During functional activity, Reliant on assistive device for balance Standing balance-Leahy Scale: Poor Standing balance comment: Reliant on RW and external physical assistance.    Special needs/care consideration {Special Care Needs/Care Considerations:304600603}   Previous Home Environment (from acute therapy documentation) Living Arrangements: Parent  Lives With: Family Available Help at Discharge: Family, Available 24 hours/day Type of Home: House Home Layout: One level Home Access: Stairs to enter Entrance Stairs-Rails: Left Entrance Stairs-Number of Steps: 4 Bathroom Shower/Tub: Tub/shower unit, Multimedia programmer: Handicapped  height Bathroom Accessibility: Yes How Accessible: Accessible via walker [place]: No  Discharge Living Setting Plans for Discharge Living Setting: Patient's home Type of Home at Discharge: House Discharge Home Layout: One level Discharge Home Access: Stairs to enter Entrance Stairs-Rails: Left Entrance Stairs-Number of Steps: 4 Discharge Bathroom Shower/Tub: Tub/shower unit, Walk-in shower Discharge Bathroom Toilet: Handicapped height Discharge Bathroom Accessibility: Yes How Accessible: Accessible via walker Does the patient have any problems obtaining your medications?: No  Social/Family/Support Systems Anticipated Caregiver: Laythen Mckinny, mother Anticipated Caregiver's Contact Information: [phone] Caregiver Availability: 24/7 Discharge Plan Discussed with Primary Caregiver: Yes Is Caregiver In Agreement with Plan?: Yes Does Caregiver/Family have Issues with Lodging/Transportation while Pt is  in Rehab?: No  Goals Patient/Family Goal for Rehab: *** Expected length of stay: *** Pt/Family Agrees to Admission and willing to participate: Yes Program Orientation Provided & Reviewed with Pt/Caregiver Including Roles  & Responsibilities: Yes  Decrease burden of Care through IP rehab admission: NA  Possible need for SNF placement upon discharge: Not anticipated  Patient Condition: {PATIENT'S CONDITION:22832}  Preadmission Screen Completed By:  Bethel Born, 07/28/2021 12:25 PM ______________________________________________________________________   Discussed status with Dr. Marland Kitchen on *** at *** and received approval for admission today.  Admission Coordinator:  Bethel Born, CCC-SLP, time ***/Date ***   Assessment/Plan: Diagnosis: Does the need for close, 24 hr/day Medical supervision in concert with the patient's rehab needs make it unreasonable for this patient to be served in a less intensive setting?  {yes_no_potentially:3041433} Co-Morbidities requiring supervision/potential complications: *** Due to {due RE:257123, does the patient require 24 hr/day rehab nursing? {yes_no_potentially:3041433} Does the patient require coordinated care of a physician, rehab nurse, PT, OT, and SLP to address physical and functional deficits in the context of the above medical diagnosis(es)? {yes_no_potentially:3041433} Addressing deficits in the following areas: {deficits:3041436} Can the patient actively participate in an intensive therapy program of at least 3 hrs of therapy 5 days a week? {yes_no_potentially:3041433} The potential for patient to make measurable gains while on inpatient rehab is {potential:3041437} Anticipated functional outcomes upon discharge from inpatient rehab: {functional outcomes:304600100} PT, {functional outcomes:304600100} OT, {functional outcomes:304600100} SLP Estimated rehab length of stay to reach the above functional goals is: *** Anticipated discharge destination: {anticipated dc setting:21604} 10. Overall Rehab/Functional Prognosis: {potential:3041437}   MD Signature: ***

## 2021-07-28 NOTE — Progress Notes (Signed)
[facility] ADULT ICU REPLACEMENT PROTOCOL   The patient does apply for the [facility] Adult ICU Electrolyte Replacment Protocol based on the criteria listed below:   1.Exclusion criteria: TCTS patients, ECMO patients, and Dialysis patients 2. Is GFR >/= 30 ml/min? Yes.    Patient's GFR today is >60 3. Is SCr </= 2? Yes.   Patient's SCr is 1.14 mg/dL 4. Did SCr increase >/= 0.5 in 24 hours? No. 5.Pt's weight >40kg  Yes.   6. Abnormal electrolyte(s): Mag  7. Electrolytes replaced per protocol 8.  Call MD STAT for K+ </= 2.5, Phos </= 1, or Mag </= 1 Physician:  Arnette Schaumann [facility] [place] 07/28/2021 6:03 AM

## 2021-07-28 NOTE — Progress Notes (Addendum)
Advanced Heart Failure Rounding Note  PCP-Cardiologist: Carlyle Dolly, MD   Subjective:   Admitted with A/C HFrEF/shock. Intubated.   Transferred to [facility] from [facility] for shock/HF.   2/8 Bld CX- NGTD 2/9 Extubated  Sitting up in chair. Feeling better. Denies dyspnea.   Remains on milrinone 0.25. Co-ox 67% Diuresed well. Down another 2 pounds. (14 pounds total) . Scr stable at 1.1. LFTs improving.  Na 130 -> 125  CVP 11 (checked personally)    Objective:   Weight Range: 87 kg Body mass index is 28.32 kg/m.   Vital Signs:   Temp:  [97.6 F (36.4 C)-98.6 F (37 C)] 98.1 F (36.7 C) (02/12 1119) Pulse Rate:  [80-92] 87 (02/12 0900) Resp:  [15-40] 22 (02/12 0900) BP: (103-118)/(82-94) 110/86 (02/12 0900) SpO2:  [94 %-100 %] 99 % (02/12 0900) Arterial Line BP: (97-117)/(67-77) 109/77 (02/11 1800) FiO2 (%):  [30 %-35 %] 30 % (02/12 0700) Weight:  [87 kg] 87 kg (02/12 0600) Last BM Date:  (PTA on 2/7 per patient)  Weight change: Filed Weights   07/25/21 0500 07/27/21 0500 07/28/21 0600  Weight: 93.2 kg 87.7 kg 87 kg    Intake/Output:   Intake/Output Summary (Last 24 hours) at 07/28/2021 1218 Last data filed at 07/28/2021 0900 Gross per 24 hour  Intake 1563.63 ml  Output 3550 ml  Net -1986.37 ml       Physical Exam    General:  Sitting up in chair More alert   HEENT: normal Neck: supple. JVP to ear  Carotids 2+ bilat; no bruits. No lymphadenopathy or thryomegaly appreciated. Cor: PMI nondisplaced. Regular rate & rhythm. Mechanical s1 Lungs: clear Abdomen: soft, nontender, nondistended. No hepatosplenomegaly. No bruits or masses. Good bowel sounds. Extremities: no cyanosis, clubbing, rash, edema + pyoderma lesions Neuro: alert & orientedx3, cranial nerves grossly intact. moves all 4 extremities w/o difficulty. Affect pleasant   Telemetry   SR 80-90s + PVC Personally reviewed   Labs    CBC Recent Labs    07/27/21 0320 07/28/21 0400  WBC 8.7 8.6   HGB 10.0* 10.7*  HCT 31.6* 33.0*  MCV 79.0* 77.1*  PLT 286 [id number]    Basic Metabolic Panel Recent Labs    07/26/21 0359 07/26/21 0929 07/27/21 0320 07/27/21 1200 07/28/21 0400  NA 136   < > 130*  --  125*  K 3.0*   < > 4.2 3.4* 3.9  CL 97*   < > 89*  --  84*  CO2 33*   < > 32  --  32  GLUCOSE 140*   < > 110*  --  120*  BUN 18   < > 11  --  17  CREATININE 1.08   < > 1.08  --  1.14  CALCIUM 8.5*   < > 8.8*  --  9.2  MG 1.8  --  1.4* 2.0 1.8  PHOS 2.2*  --  2.8  --   --    < > = values in this interval not displayed.    Liver Function Tests Recent Labs    07/27/21 0320 07/28/21 0400  AST 557* 378*  ALT 508* 380*  ALKPHOS 122 145*  BILITOT 1.2 1.3*  PROT 8.9* 9.5*  ALBUMIN 2.8* 3.0*    No results for input(s): LIPASE, AMYLASE in the last 72 hours. Cardiac Enzymes No results for input(s): CKTOTAL, CKMB, CKMBINDEX, TROPONINI in the last 72 hours.  BNP: BNP (last 3 results) Recent Labs  07/17/21 1320 07/22/21 0248 07/24/21 1433  BNP 1,686.0* 2,708.0* 3,184.1*     ProBNP (last 3 results) No results for input(s): PROBNP in the last 8760 hours.   D-Dimer No results for input(s): DDIMER in the last 72 hours. Hemoglobin A1C No results for input(s): HGBA1C in the last 72 hours.  Fasting Lipid Panel No results for input(s): CHOL, HDL, LDLCALC, TRIG, CHOLHDL, LDLDIRECT in the last 72 hours. Thyroid Function Tests No results for input(s): TSH, T4TOTAL, T3FREE, THYROIDAB in the last 72 hours.  Invalid input(s): FREET3  Other results:   Imaging    No results found.   Medications:     Scheduled Medications:  Chlorhexidine Gluconate Cloth  6 each Topical Daily   docusate sodium  100 mg Oral BID   feeding supplement  237 mL Oral BID BM   Gerhardt's butt cream  1 application Topical TID   multivitamin with minerals  1 tablet Oral Daily   pantoprazole  40 mg Oral Daily   polyethylene glycol  17 g Oral Daily   sodium chloride flush  10-40 mL  Intracatheter Q12H   sodium chloride flush  10-40 mL Intracatheter Q12H   spironolactone  12.5 mg Oral Daily   warfarin  5 mg Oral ONCE-1600   Warfarin - Pharmacist Dosing Inpatient   Does not apply q1600    Infusions:  sodium chloride Stopped (07/26/21 0532)   amiodarone 30 mg/hr (07/28/21 0900)   milrinone 0.25 mcg/kg/min (07/28/21 0900)    PRN Medications: sodium chloride, docusate sodium, Gerhardt's butt cream, polyethylene glycol, sodium chloride flush, sodium chloride flush    Patient Profile   36 y/o male with previous MV repair in 2019 due to torn chord with subsequent recurrent MR, chronic systolic HF due to biventricular HF with EF 30-35% in 3/22, PAF and recent hospitalization 12/22 for a/c CHF w/ low output in setting of severe MR, requiring stabilization w/ milrinone and transfer to [facility] for high risk mechanical Mitral valve replacement on 12/26. Now readmitted to [facility] for a/c CHF/shock and acute hypoxic respiratory failure requiring intubation.      Assessment/Plan  1. Acute on Chronic Biventricular Heart Failure>>Low Output Heart Failure   - BiV failure likely primarily valvular in nature in setting of severe MR, now s/p recent MV replacement 12/22 at [place] EF 40 to 45% with severe MR with multiple jets including a possible paravalvular leak. Miderate RV dysfunction severe TR - [facility] 3/22 showed normal coronaries, NICM EF 30-35%, severe MR w/ prominent v-waves in PCWP tracing, mild pulmonary venous HTN with normal CO (Fick cardiac output/index = 5.3/2.5). - Referred for re-do MVR 4/22 but did not follow-up w/ Dr. Roxy Manns - Admit 12/22 for shock. Echo EF 20-25%, mild LV dilation, D-shaped septum, moderately decreased RV systolic function with mild RV enlargement, s/p MV repair with mean gradient 15 mmHg and severe MR, mild TR, dilated IVC - Transferred to [facility] and underwent mechanical MVR on 12/26 - Admitted w/ a/c CHF/shock.  Suspect acute decompensation due to  recurrent Afib w/ RVR - Echo now shows LVEF <20%, RV severely reduced. Mechanical MV ok, mGradient 3 mmHg - CO-OX 67% on 0.25 milrinone. Will continue - Excellent diuresis. CVP 11. Continue IV lasix one more day - Continue spiro.  - Continue digoxin - Add losartan 12.5 bid - SGLT2i soon  - Discussed with [facility] team. Remains tenuous. Depending on his progress, may need to consider VAD/transplant down the road if candidate. Will need to follow closely.  Discussed with patients sister at bedside.   2. Severe Mitral Valve Regurgitation s/p Mitral Valve Replacement  - previous MV repair in 2019 due to torn chord with subsequent recurrent MR - s/p mechanical MVR at [facility] 06/10/21 - repeat 2D echo w/ stable prothesis, mGradient 3 mmHg  - INR 9.6 on admit. Coumadin on hold. INR 2.5 today. Will restart. Discussed dosing with PharmD personally.    3. Acute Hypoxic Respiratory Failure, S/p Intubation  - 2/2 acute pulmonary edema - extubated 2/9 - improved with diuresis.  - need to ambulate - I educated him on IS    4. Atrial Fibrillation w/ RVR  - in setting of long standing MV disease, echo w/ severe LAE,  LA diam: 5.90 cm - Maintaining SR.  - continue amio gtt 30/hr - Warfarin management as above   5. AKI  - baseline SCr ~1.0 - 1.89 on admit. Now 1.1 - suspect cardiorenal - support CO w/ milrinone  - avoid hypotension, NE if needed  - follow BMP    6. Sacral Wounds  -  Multiple old and new pustules some bleeding. Open areas on sacrum (see images in CCM note)  - WOC consult   7. Hypokalemia/hypomagnesemia - K 4.2, mag 1.6 - Supp today  8. Shock liver - LFTs s improving - Follow  9. Upper GI bleed - Dark OGT secretions.  - Gastroccult +  - Continue PPI - Hgb table at 10  10. Microcytic anemia - check iron stores   Length of Stay: [place], MD  07/28/2021, 12:18 PM  Advanced Heart Failure Team Pager [phone] (M-F; 7a - 5p)  Please contact [facility]  for night-coverage after hours (5p -7a ) and weekends on [email or web address]

## 2021-07-29 ENCOUNTER — Encounter (HOSPITAL_COMMUNITY): Payer: Medicaid Other | Admitting: Internal Medicine

## 2021-07-29 LAB — BASIC METABOLIC PANEL
Anion gap: 10 (ref 5–15)
Anion gap: 8 (ref 5–15)
BUN: 21 mg/dL — ABNORMAL HIGH (ref 6–20)
BUN: 21 mg/dL — ABNORMAL HIGH (ref 6–20)
CO2: 31 mmol/L (ref 22–32)
CO2: 31 mmol/L (ref 22–32)
Calcium: 9 mg/dL (ref 8.9–10.3)
Calcium: 8.9 mg/dL (ref 8.9–10.3)
Chloride: 84 mmol/L — ABNORMAL LOW (ref 98–111)
Chloride: 89 mmol/L — ABNORMAL LOW (ref 98–111)
Creatinine, Ser: 1.15 mg/dL (ref 0.61–1.24)
Creatinine, Ser: 1.06 mg/dL (ref 0.61–1.24)
GFR, Estimated: >60 mL/min (ref >60)
GFR, Estimated: >60 mL/min (ref >60)
Glucose, Bld: 164 mg/dL — ABNORMAL HIGH (ref 70–99)
Glucose, Bld: 123 mg/dL — ABNORMAL HIGH (ref 70–99)
Potassium: 3.3 mmol/L — ABNORMAL LOW (ref 3.5–5.1)
Potassium: 3.8 mmol/L (ref 3.5–5.1)
Sodium: 125 mmol/L — ABNORMAL LOW (ref 135–145)
Sodium: 128 mmol/L — ABNORMAL LOW (ref 135–145)

## 2021-07-29 LAB — COMPREHENSIVE METABOLIC PANEL
ALT: 266 U/L — ABNORMAL HIGH (ref 0–44)
AST: 239 U/L — ABNORMAL HIGH (ref 15–41)
Albumin: 2.8 g/dL — ABNORMAL LOW (ref 3.5–5.0)
Alkaline Phosphatase: 162 U/L — ABNORMAL HIGH (ref 38–126)
Anion gap: 7 (ref 5–15)
BUN: 22 mg/dL — ABNORMAL HIGH (ref 6–20)
CO2: 32 mmol/L (ref 22–32)
Calcium: 8.9 mg/dL (ref 8.9–10.3)
Chloride: 85 mmol/L — ABNORMAL LOW (ref 98–111)
Creatinine, Ser: 1.1 mg/dL (ref 0.61–1.24)
GFR, Estimated: >60 mL/min (ref >60)
Glucose, Bld: 127 mg/dL — ABNORMAL HIGH (ref 70–99)
Potassium: 3.5 mmol/L (ref 3.5–5.1)
Sodium: 124 mmol/L — ABNORMAL LOW (ref 135–145)
Total Bilirubin: 1.1 mg/dL (ref 0.3–1.2)
Total Protein: 9 g/dL — ABNORMAL HIGH (ref 6.5–8.1)

## 2021-07-29 LAB — CULTURE, BLOOD (ROUTINE X 2)
Culture: NO GROWTH
Culture: NO GROWTH
Special Requests: ADEQUATE
Special Requests: ADEQUATE

## 2021-07-29 LAB — CBC
HCT: 32.4 % — ABNORMAL LOW (ref 39.0–52.0)
Hemoglobin: 10.6 g/dL — ABNORMAL LOW (ref 13.0–17.0)
MCH: 25.2 pg — ABNORMAL LOW (ref 26.0–34.0)
MCHC: 32.7 g/dL (ref 30.0–36.0)
MCV: 77.1 fL — ABNORMAL LOW (ref 80.0–100.0)
Platelets: 267 10*3/uL (ref 150–400)
RBC: 4.2 MIL/uL — ABNORMAL LOW (ref 4.22–5.81)
RDW: 15.8 % — ABNORMAL HIGH (ref 11.5–15.5)
WBC: 8.5 10*3/uL (ref 4.0–10.5)
nRBC: 0 % (ref 0.0–0.2)

## 2021-07-29 LAB — MAGNESIUM: Magnesium: 1.6 mg/dL — ABNORMAL LOW (ref 1.7–2.4)

## 2021-07-29 LAB — HEPARIN LEVEL (UNFRACTIONATED): Heparin Unfractionated: <0.1 IU/mL — ABNORMAL LOW (ref 0.30–0.70)

## 2021-07-29 LAB — COOXEMETRY PANEL
Carboxyhemoglobin: 1.4 % (ref 0.5–1.5)
Methemoglobin: 0.8 % (ref 0.0–1.5)
O2 Saturation: 60 %
Total hemoglobin: 10.6 g/dL — ABNORMAL LOW (ref 12.0–16.0)

## 2021-07-29 LAB — HEPATIC FUNCTION PANEL
ALT: 254 U/L — ABNORMAL HIGH (ref 0–44)
AST: 223 U/L — ABNORMAL HIGH (ref 15–41)
Albumin: 2.8 g/dL — ABNORMAL LOW (ref 3.5–5.0)
Alkaline Phosphatase: 160 U/L — ABNORMAL HIGH (ref 38–126)
Bilirubin, Direct: 0.4 mg/dL — ABNORMAL HIGH (ref 0.0–0.2)
Indirect Bilirubin: 0.6 mg/dL (ref 0.3–0.9)
Total Bilirubin: 1 mg/dL (ref 0.3–1.2)
Total Protein: 9.2 g/dL — ABNORMAL HIGH (ref 6.5–8.1)

## 2021-07-29 LAB — PROTIME-INR
INR: 1.8 — ABNORMAL HIGH (ref 0.8–1.2)
Prothrombin Time: 20.8 seconds — ABNORMAL HIGH (ref 11.4–15.2)

## 2021-07-29 LAB — GLUCOSE, CAPILLARY
Glucose-Capillary: 109 mg/dL — ABNORMAL HIGH (ref 70–99)
Glucose-Capillary: 109 mg/dL — ABNORMAL HIGH (ref 70–99)
Glucose-Capillary: 114 mg/dL — ABNORMAL HIGH (ref 70–99)
Glucose-Capillary: 121 mg/dL — ABNORMAL HIGH (ref 70–99)
Glucose-Capillary: 124 mg/dL — ABNORMAL HIGH (ref 70–99)
Glucose-Capillary: 178 mg/dL — ABNORMAL HIGH (ref 70–99)

## 2021-07-29 MED ORDER — TOLVAPTAN 15 MG PO TABS
15.0000 mg | ORAL_TABLET | Freq: Once | ORAL | Status: DC
  Filled 2021-07-29: qty 1

## 2021-07-29 MED ORDER — WARFARIN SODIUM 5 MG PO TABS
8.0000 mg | ORAL_TABLET | Freq: Once | ORAL | Status: AC
  Administered 2021-07-29: 8 mg via ORAL
  Filled 2021-07-29: qty 1

## 2021-07-29 MED ORDER — MAGNESIUM SULFATE 4 GM/100ML IV SOLN
4.0000 g | Freq: Once | INTRAVENOUS | Status: AC
  Administered 2021-07-29: 4 g via INTRAVENOUS
  Filled 2021-07-29: qty 100

## 2021-07-29 MED ORDER — DIGOXIN 125 MCG PO TABS
0.1250 mg | ORAL_TABLET | Freq: Every day | ORAL | Status: DC
  Administered 2021-07-29 – 2021-08-03 (×6): 0.125 mg via ORAL
  Filled 2021-07-29 (×6): qty 1

## 2021-07-29 MED ORDER — POTASSIUM CHLORIDE CRYS ER 20 MEQ PO TBCR
40.0000 meq | EXTENDED_RELEASE_TABLET | Freq: Four times a day (QID) | ORAL | Status: AC
  Administered 2021-07-29 (×2): 40 meq via ORAL
  Filled 2021-07-29 (×2): qty 2

## 2021-07-29 MED ORDER — SODIUM CHLORIDE 0.9 % IV SOLN
510.0000 mg | Freq: Once | INTRAVENOUS | Status: AC
  Administered 2021-07-29: 510 mg via INTRAVENOUS
  Filled 2021-07-29: qty 17

## 2021-07-29 MED ORDER — TOLVAPTAN 15 MG PO TABS
15.0000 mg | ORAL_TABLET | Freq: Once | ORAL | Status: AC
  Administered 2021-07-29: 15 mg via ORAL
  Filled 2021-07-29: qty 1

## 2021-07-29 MED ORDER — PROCHLORPERAZINE EDISYLATE 10 MG/2ML IJ SOLN
10.0000 mg | Freq: Once | INTRAMUSCULAR | Status: AC
  Administered 2021-07-29: 10 mg via INTRAVENOUS
  Filled 2021-07-29: qty 2

## 2021-07-29 MED ORDER — POTASSIUM CHLORIDE CRYS ER 20 MEQ PO TBCR
40.0000 meq | EXTENDED_RELEASE_TABLET | Freq: Once | ORAL | Status: AC
  Administered 2021-07-29: 40 meq via ORAL
  Filled 2021-07-29: qty 2

## 2021-07-29 MED ORDER — HEPARIN (PORCINE) 25000 UT/250ML-% IV SOLN
800.0000 [IU]/h | INTRAVENOUS | Status: DC
  Administered 2021-07-29 – 2021-07-30 (×2): 800 [IU]/h via INTRAVENOUS
  Filled 2021-07-29 (×2): qty 250

## 2021-07-29 NOTE — Evaluation (Signed)
Occupational Therapy Evaluation Patient Details Name: Donald [name] MRN: [id number] DOB: [birth year] Today's Date: 07/29/2021   History of Present Illness 36 y.o. male who presented to [facility] 2/7 for SOB, chest pain, palpitations, and AMS. Pt intubated and transferred to [facility] for ICU care and heart failure service consultation. Extubated 2/9. Pt admitted to [facility] for a/c CHF/shock and acute hypoxic respiratory failure. 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 PMH: anemia, autoimmune disorder, CHF, DVT, dysrhythmia, mitral regurgitation, mitral stenosis, MI, PAF, pyoderma gangrenosa, seonegative spondylitis, tricuspid regurgitation   Clinical Impression   PTA, pt was living with his parents and was performing BADLs with assistance as needed for bathing due to fatigue. Pt was working until Nov 2022 and stopped due to health reasons.  Pt currently requiring Min Guard-Min A for standing balance, LB ADLs, and functional mobility with RW. Pt presenting with decreased balance, strength, and activity tolerance. Very motivated to participate in therapy and return home. Pt would benefit from further acute OT to facilitate safe dc. Recommend dc to home once medically stable per physician.       Recommendations for follow up therapy are one component of a multi-disciplinary discharge planning process, led by the attending physician.  Recommendations may be updated based on patient status, additional functional criteria and insurance authorization.   Follow Up Recommendations  No OT follow up    Assistance Recommended at Discharge Intermittent Supervision/Assistance  Patient can return home with the following A little help with walking and/or transfers;A little help with bathing/dressing/bathroom    Functional Status Assessment  Patient has had a recent decline in their functional status and demonstrates the ability to make significant improvements in function in a reasonable and predictable amount  of time.  Equipment Recommendations  None recommended by OT    Recommendations for Other Services PT consult     Precautions / Restrictions Precautions Precautions: Fall;Other (comment) Precaution Comments: A-line, monitor SpO2 Restrictions Weight Bearing Restrictions: No Other Position/Activity Restrictions: s/p MVR with sternotomy December 2022      Mobility Bed Mobility Overal bed mobility: Needs Assistance Bed Mobility: Supine to Sit     Supine to sit: Min guard     General bed mobility comments: Min Guard A for safety    Transfers Overall transfer level: Needs assistance Equipment used: Rolling walker (2 wheels) Transfers: Sit to/from Stand Sit to Stand: Min assist, +2 safety/equipment           General transfer comment: Min A for power up and gaining balance. +2 for safety      Balance Overall balance assessment: Needs assistance Sitting-balance support: No upper extremity supported, Feet supported Sitting balance-Leahy Scale: Fair     Standing balance support: No upper extremity supported, Bilateral upper extremity supported, Reliant on assistive device for balance, During functional activity Standing balance-Leahy Scale: Fair Standing balance comment: Able to maintain static standing at sink without UE support                           ADL either performed or assessed with clinical judgement   ADL Overall ADL's : Needs assistance/impaired Eating/Feeding: Set up;Sitting   Grooming: Min guard;Oral care;Standing   Upper Body Bathing: Min guard;Sitting   Lower Body Bathing: Minimal assistance;Sit to/from stand   Upper Body Dressing : Min guard;Sitting   Lower Body Dressing: Minimal assistance;Sit to/from stand   Toilet Transfer: Ambulation;Rolling walker (2 wheels);Minimal assistance;+2 for safety/equipment  Functional mobility during ADLs: Min guard;Rolling walker (2 wheels) General ADL Comments: Pt presenting with  decreased strength, balance, and activity tolerance. Cues thorughout for upright posture - with forward flexing with fatigue     Vision         Perception     Praxis      Pertinent Vitals/Pain Pain Assessment Pain Assessment: No/denies pain     Hand Dominance Right   Extremity/Trunk Assessment Upper Extremity Assessment Upper Extremity Assessment: Overall WFL for tasks assessed   Lower Extremity Assessment Lower Extremity Assessment: Defer to PT evaluation   Cervical / Trunk Assessment Cervical / Trunk Assessment: Kyphotic   Communication     Cognition Arousal/Alertness: Awake/alert Behavior During Therapy: WFL for tasks assessed/performed Overall Cognitive Status: Impaired/Different from baseline Area of Impairment: Problem solving, Following commands                       Following Commands: Follows one step commands with increased time     Problem Solving: Requires verbal cues General Comments: Generally flat. However, occasionally smiling. Following commands with increased time and cues.     General Comments  SpO2 90s on RA. HR 80-90s. BP before activity 107/74 (81) and after activity 116/67 (82).    Exercises     Shoulder Instructions      Home Living Family/patient expects to be discharged to:: Private residence Living Arrangements: Parent Available Help at Discharge: Family;Available 24 hours/day Type of Home: House Home Access: Stairs to enter [employer] of Steps: 4 Entrance Stairs-Rails: Left Home Layout: One level     Bathroom Shower/Tub: Tub/shower unit;Walk-in shower   Bathroom Toilet: Handicapped height Bathroom Accessibility: Yes How Accessible: Accessible via walker Home Equipment: Grab bars - tub/shower;Shower seat          Prior Functioning/Environment Prior Level of Function : Independent/Modified Independent             Mobility Comments: Pt did not use an AD PTA and denies any recent falls. ADLs  Comments: Prior to November 2022, he was working and IND for all ADLs, but since his recent surgery his mother has assisted him PRN for bathing with pt sitting on shower seat.        OT Problem List: Decreased strength;Decreased range of motion;Decreased activity tolerance;Impaired balance (sitting and/or standing);Decreased knowledge of use of DME or AE;Decreased knowledge of precautions      OT Treatment/Interventions: Self-care/ADL training;Therapeutic exercise;Energy conservation;DME and/or AE instruction;Therapeutic activities;Patient/family education    OT Goals(Current goals can be found in the care plan section) Acute Rehab OT Goals Patient Stated Goal: Get stronger OT Goal Formulation: With patient Time For Goal Achievement: 08/12/21 Potential to Achieve Goals: Good  OT Frequency: Min 2X/week    Co-evaluation              AM-PAC OT "6 Clicks" Daily Activity     Outcome Measure Help from another person eating meals?: None Help from another person taking care of personal grooming?: A Little Help from another person toileting, which includes using toliet, bedpan, or urinal?: A Little Help from another person bathing (including washing, rinsing, drying)?: A Little Help from another person to put on and taking off regular upper body clothing?: A Little Help from another person to put on and taking off regular lower body clothing?: A Little 6 Click Score: 19   End of Session Equipment Utilized During Treatment: Rolling walker (2 wheels);Gait belt Nurse Communication: Mobility status  Activity Tolerance:  Patient tolerated treatment well Patient left: in chair;with call bell/phone within reach;with chair alarm set  OT Visit Diagnosis: Other abnormalities of gait and mobility (R26.89);Unsteadiness on feet (R26.81);Muscle weakness (generalized) (M62.81)                Time: [id number] OT Time Calculation (min): 13 min Charges:  OT General Charges $OT Visit: 1 Visit OT  Evaluation $OT Eval Moderate Complexity: Lydia, OTR/L Acute Rehab Pager: [phone] Office: [place] 07/29/2021, 9:41 AM

## 2021-07-29 NOTE — Progress Notes (Signed)
[place] for Heparin> warfarin  Indication: mechanical MVR   No Known Allergies  Patient Measurements: Height: 5\' 9"  (175.3 cm) Weight: 87.8 kg (193 lb 9 oz) IBW/kg (Calculated) : 70.7  Vital Signs: Temp: 97.8 F (36.6 C) (02/13 0000) Temp Source: Oral (02/13 0000) BP: 99/71 (02/13 0700) Pulse Rate: 76 (02/13 0700)  Labs: Recent Labs    07/27/21 0320 07/28/21 0400 07/29/21 0325  HGB 10.0* 10.7* 10.6*  HCT 31.6* 33.0* 32.4*  PLT 286 307 267  LABPROT 35.1* 27.3* 20.8*  INR 3.5* 2.5* 1.8*  CREATININE 1.08 1.14 1.10     Estimated Creatinine Clearance: 104.7 mL/min (by C-G formula based on SCr of 1.1 mg/dL).   Medical History: Past Medical History:  Diagnosis Date   Anemia    Autoimmune disorder ([place])    pyoderma gangrenosum   CHF (congestive heart failure) ([place])    Chronic systolic heart failure ([place])    a. EF 35-40% by echo in 07/2018 b. EF at 45% by repeat echo in 04/2020   DVT (deep venous thrombosis) (H[facility])    h/o   Dysrhythmia    Mitral regurgitation    a. 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 b. 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HCC)    arthritis   Tricuspid regurgitation     Assessment: 36 yo M present on 07/24/2021 with shortness of breath and found to have acute/chronic heart failure. Of note, recent history of mechanical MV replacement 12/22 at [facility]. On warfarin PTA with last dose 07/22/21. INR was 9.8 on admit, FFP given 2/8. PTA regimen: MWF 4mg , STThS 3mg  (total weekly 24mg ). Pharmacy consulted for warfarin management.   INR today is down below therapeutic range at 1.8, H/H ok. Will continue boost to prevent further drop. Will also add low dose heparin bridge.   Goal of Therapy:   Heparin level <0.3 INR goal: 2.5-3.5 Monitor platelets by anticoagulation protocol: Yes   Plan:  -Warfarin 8mg  PO x1 tonight -Start heparin at 800 units/hr - no plans to titrate at this time, no bolusing -Daily INR  Erin Hearing PharmD., BCPS Clinical Pharmacist 07/29/2021 7:56 AM

## 2021-07-29 NOTE — Progress Notes (Signed)
Inpatient Rehab Admissions Coordinator:  Note PT is now recommending OP cardiac rehab and OT is not recommending follow up. CIR therapy is not warranted.  AC will sign off.   Wolfgang Phoenix, MS, CCC-SLP Admissions Coordinator [phone]

## 2021-07-29 NOTE — Progress Notes (Addendum)
Advanced Heart Failure Rounding Note  PCP-Cardiologist: Dina Rich, MD   Subjective:   Admitted with A/C HFrEF/shock. Intubated.   Transferred to [facility] from [facility] for shock/HF.   2/8 Bld CX- NGTD 2/9 Extubated  Remains on milrinone 0.25. Co-ox 60%   3L in UOP yesterday but only net negative 1.7L. CVP 4-5   Na 124. No confusion   NSR w/ PACs on tele.   Sitting up eating breakfast. Feels ok. Denies dyspnea. No chest pain. Appetite is good.    Objective:   Weight Range: 87.8 kg Body mass index is 28.58 kg/m.   Vital Signs:   Temp:  [97.8 F (36.6 C)-98.1 F (36.7 C)] 97.8 F (36.6 C) (02/13 0000) Pulse Rate:  [76-90] 76 (02/13 0700) Resp:  [14-33] 26 (02/13 0700) BP: (99-120)/(64-91) 99/71 (02/13 0700) SpO2:  [96 %-100 %] 98 % (02/13 0700) FiO2 (%):  [30 %] 30 % (02/13 0200) Weight:  [87.8 kg] 87.8 kg (02/13 0500) Last BM Date: 07/28/21  Weight change: Filed Weights   07/27/21 0500 07/28/21 0600 07/29/21 0500  Weight: 87.7 kg 87 kg 87.8 kg    Intake/Output:   Intake/Output Summary (Last 24 hours) at 07/29/2021 0740 Last data filed at 07/29/2021 0600 Gross per 24 hour  Intake 1327.21 ml  Output 3052 ml  Net -1724.79 ml      Physical Exam    CVP 5  General:  sitting up in bed. No distress  HEENT: normal Neck: supple. JVP flat. + Lt IJ CVC Carotids 2+ bilat; no bruits. No lymphadenopathy or thryomegaly appreciated. Cor: PMI nondisplaced. Regular rate & rhythm. Mechanical s1 Lungs: CTAB. No wheezing  Abdomen: soft, nontender, nondistended. No hepatosplenomegaly. No bruits or masses. Good bowel sounds. Extremities: no cyanosis, clubbing, rash, edema + pyoderma lesions Neuro: alert & orientedx3, cranial nerves grossly intact. moves all 4 extremities w/o difficulty. Affect pleasant   Telemetry   NSR 90s w/ PACs Personally reviewed   Labs    CBC Recent Labs    07/28/21 0400 07/29/21 0325  WBC 8.6 8.5  HGB 10.7* 10.6*  HCT 33.0* 32.4*   MCV 77.1* 77.1*  PLT 307 267   Basic Metabolic Panel Recent Labs    40/98/11 0320 07/27/21 1200 07/28/21 0400 07/29/21 0325  NA 130*  --  125* 124*  K 4.2   < > 3.9 3.5  CL 89*  --  84* 85*  CO2 32  --  32 32  GLUCOSE 110*  --  120* 127*  BUN 11  --  17 22*  CREATININE 1.08  --  1.14 1.10  CALCIUM 8.8*  --  9.2 8.9  MG 1.4*   < > 1.8 1.6*  PHOS 2.8  --   --   --    < > = values in this interval not displayed.   Liver Function Tests Recent Labs    07/28/21 0400 07/29/21 0325  AST 378* 239*  ALT 380* 266*  ALKPHOS 145* 162*  BILITOT 1.3* 1.1  PROT 9.5* 9.0*  ALBUMIN 3.0* 2.8*   No results for input(s): LIPASE, AMYLASE in the last 72 hours. Cardiac Enzymes No results for input(s): CKTOTAL, CKMB, CKMBINDEX, TROPONINI in the last 72 hours.  BNP: BNP (last 3 results) Recent Labs    07/17/21 1320 07/22/21 0248 07/24/21 1433  BNP 1,686.0* 2,708.0* 3,184.1*    ProBNP (last 3 results) No results for input(s): PROBNP in the last 8760 hours.   D-Dimer No results for input(s): DDIMER  in the last 72 hours. Hemoglobin A1C No results for input(s): HGBA1C in the last 72 hours.  Fasting Lipid Panel No results for input(s): CHOL, HDL, LDLCALC, TRIG, CHOLHDL, LDLDIRECT in the last 72 hours. Thyroid Function Tests No results for input(s): TSH, T4TOTAL, T3FREE, THYROIDAB in the last 72 hours.  Invalid input(s): FREET3  Other results:   Imaging    No results found.   Medications:     Scheduled Medications:  Chlorhexidine Gluconate Cloth  6 each Topical Daily   docusate sodium  100 mg Oral BID   feeding supplement  237 mL Oral BID BM   furosemide  80 mg Intravenous BID   Gerhardt's butt cream  1 application Topical TID   losartan  12.5 mg Oral BID   multivitamin with minerals  1 tablet Oral Daily   pantoprazole  40 mg Oral Daily   polyethylene glycol  17 g Oral Daily   sodium chloride flush  10-40 mL Intracatheter Q12H   sodium chloride flush  10-40 mL  Intracatheter Q12H   spironolactone  12.5 mg Oral Daily   Warfarin - Pharmacist Dosing Inpatient   Does not apply q1600    Infusions:  sodium chloride Stopped (07/26/21 0532)   amiodarone 30 mg/hr (07/29/21 0600)   milrinone 0.25 mcg/kg/min (07/29/21 0600)    PRN Medications: sodium chloride, docusate sodium, Gerhardt's butt cream, polyethylene glycol, sodium chloride flush, sodium chloride flush    Patient Profile   36 y/o male with previous MV repair in 2019 due to torn chord with subsequent recurrent MR, chronic systolic HF due to biventricular HF with EF 30-35% in 3/22, PAF and recent hospitalization 12/22 for a/c CHF w/ low output in setting of severe MR, requiring stabilization w/ milrinone and transfer to [facility] for high risk mechanical Mitral valve replacement on 12/26. Now readmitted to [facility] for a/c CHF/shock and acute hypoxic respiratory failure requiring intubation.      Assessment/Plan  1. Acute on Chronic Biventricular Heart Failure>>Low Output Heart Failure   - BiV failure likely primarily valvular in nature in setting of severe MR, now s/p recent MV replacement 12/22 at [facility]  - TEE12/21 EF 40 to 45% with severe MR with multiple jets including a possible paravalvular leak. Miderate RV dysfunction severe TR - [facility] 3/22 showed normal coronaries, NICM EF 30-35%, severe MR w/ prominent v-waves in PCWP tracing, mild pulmonary venous HTN with normal CO (Fick cardiac output/index = 5.3/2.5). - Referred for re-do MVR 4/22 but did not follow-up w/ Dr. Cornelius Moras - Admit 12/22 for shock. Echo EF 20-25%, mild LV dilation, D-shaped septum, moderately decreased RV systolic function with mild RV enlargement, s/p MV repair with mean gradient 15 mmHg and severe MR, mild TR, dilated IVC - Transferred to [facility] and underwent mechanical MVR on 12/26 - Admitted w/ a/c CHF/shock.  Suspect acute decompensation due to recurrent Afib w/ RVR - Echo now shows LVEF <20%, RV severely reduced. Mechanical MV ok,  mGradient 3 mmHg - CO-OX 60% on 0.25 milrinone. Wean to 0.125 today  - Excellent diuresis. CVP 5  - Continue spiro 12.5 mg daily  - Add digoxin 0.125  - Continue losartan 12.5 bid. BP too soft for Entresto  - SGLT2i soon  - Discussed with [facility] team. Remains tenuous. Depending on his progress, may need to consider VAD/transplant down the road if candidate. Will need to follow closely. Discussed with patients sister at bedside.   2. Severe Mitral Valve Regurgitation s/p Mitral Valve Replacement  - previous  MV repair in 2019 due to torn chord with subsequent recurrent MR - s/p mechanical MVR at [facility] 06/10/21 - repeat 2D echo w/ stable prothesis, mGradient 3 mmHg  - INR 9.6 on admit. Coumadin held. INR 1.8 today. Back on coumadin. Discussed dosing with PharmD personally.    3. Acute Hypoxic Respiratory Failure, S/p Intubation  - 2/2 acute pulmonary edema - extubated 2/9 - improved with diuresis.  - need to ambulate - I educated him on IS    4. Atrial Fibrillation w/ RVR  - in setting of long standing MV disease, echo w/ severe LAE,  LA diam: 5.90 cm - Maintaining SR.  - continue amio gtt 30/hr while on milrinone  - Warfarin management as above   5. AKI  - baseline SCr ~1.0 - 1.89 on admit. Now 1.1 - suspect cardiorenal - support CO w/ milrinone  - avoid hypotension, NE if needed  - follow BMP    6. Sacral Wounds  - Multiple old and new pustules some bleeding. Open areas on sacrum (see images in CCM note)  - WOC consult   7. Hypokalemia/hypomagnesemia - K 3.5, Mag 1.6 - Supp today  8. Shock liver - LFTs s improving - Follow  9. Upper GI bleed - Dark OGT secretions.  - Gastroccult +  - Continue PPI - Hgb table at 10.6  10. Microcytic anemia - iron stores low - give feraheme   11. Hyponatremia - Na 124, no confusion, euvolemic   - hold diuretics - monitor   Length of Stay: 5  Brittainy Simmons, PA-C  07/29/2021, 7:40 AM  Advanced Heart Failure Team Pager  [phone] (M-F; 7a - 5p)  Please contact [facility] for night-coverage after hours (5p -7a ) and weekends on [email or web address]  Patient seen and examined with the above-signed Advanced Practice Provider and/or Housestaff. I personally reviewed laboratory data, imaging studies and relevant notes. I independently examined the patient and formulated the important aspects of the plan. I have edited the note to reflect any of my changes or salient points. I have personally discussed the plan with the patient and/or family.  Remains on milrinone. Co-ox 60% CVP down to 4. Breathing better. Sodium down to 124.  INR 1.8  General:  Sitting up  No resp difficulty HEENT: normal Neck: supple. no JVD. Carotids 2+ bilat; no bruits. No lymphadenopathy or thryomegaly appreciated. Cor: PMI nondisplaced. Regular rate & rhythm. Mechanical s1 Lungs: clear Abdomen: soft, nontender, nondistended. No hepatosplenomegaly. No bruits or masses. Good bowel sounds. Extremities: no cyanosis, clubbing, rash, edema + pyoderma lesions Neuro: alert & orientedx3, cranial nerves grossly intact. moves all 4 extremities w/o difficulty. Affect pleasant  Agree with starting slow wean of milrinone. Add digoxin. Supp lytes and give IV iron. Would give one dose tolvaptan. PT/OT to see. Needs low-dose heparin for mMVR.  Glori Bickers, MD  8:47 AM

## 2021-07-29 NOTE — Progress Notes (Signed)
ANTICOAGULATION CONSULT NOTE  Pharmacy Consult for Heparin> warfarin  Indication: mechanical MVR   No Known Allergies  Patient Measurements: Height: 5\' 9"  (175.3 cm) Weight: 87.8 kg (193 lb 9 oz) IBW/kg (Calculated) : 70.7  Vital Signs: Temp: 98 F (36.7 C) (02/13 1552) Temp Source: Oral (02/13 1552) BP: 110/70 (02/13 1800) Pulse Rate: 82 (02/13 1800)  Labs: Recent Labs    07/27/21 0320 07/28/21 0400 07/29/21 0325 07/29/21 0903 07/29/21 1642  HGB 10.0* 10.7* 10.6*  --   --   HCT 31.6* 33.0* 32.4*  --   --   PLT 286 307 267  --   --   LABPROT 35.1* 27.3* 20.8*  --   --   INR 3.5* 2.5* 1.8*  --   --   HEPARINUNFRC  --   --   --   --  <0.10*  CREATININE 1.08 1.14 1.10 1.15 1.06     Estimated Creatinine Clearance: 108.7 mL/min (by C-G formula based on SCr of 1.06 mg/dL).   Medical History: Past Medical History:  Diagnosis Date   Anemia    Autoimmune disorder ([place])    pyoderma gangrenosum   CHF (congestive heart failure) ([place])    Chronic systolic heart failure ([place])    a. EF 35-40% by echo in 07/2018 b. EF at 45% by repeat echo in 04/2020   DVT (deep venous thrombosis) ([facility])    h/o   Dysrhythmia    Mitral regurgitation    a. 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 b. 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HCC)    arthritis   Tricuspid regurgitation     Assessment: 36 yo [name] present on 07/24/2021 with shortness of breath and found to have acute/chronic heart failure. Of note, recent history of mechanical MV replacement 12/22 at [facility]. On warfarin PTA with last dose 07/22/21. INR was 9.8 on admit, FFP given 2/8. PTA regimen: MWF 4mg , STThS 3mg  (total weekly 24mg ). Pharmacy consulted for warfarin management.   INR today is down below  therapeutic range at 1.8, H/H ok. Will continue boost to prevent further drop. Will also add low dose heparin bridge 800 uts/hr with no up titration. Initial heparin level < 0.1   Goal of Therapy:  Heparin level <0.3 INR goal: 2.5-3.5 Monitor platelets by anticoagulation protocol: Yes   Plan:  -Warfarin 8mg  PO x1 tonight -heparin at 800 units/hr - no plans to titrate at this time, no bolusing -Daily INR   Bonnita Nasuti Pharm.D. CPP, BCPS Clinical Pharmacist [phone] 07/29/2021 7:13 PM

## 2021-07-29 NOTE — Progress Notes (Addendum)
Physical Therapy Treatment Patient Details Name: Donald [name] MRN: [id number] DOB: June 13, 1986 Today's Date: 07/29/2021   History of Present Illness 36 y.o. male who presented to [facility] 2/7 for SOB, chest pain, palpitations, and AMS. Pt intubated and transferred to [facility] for ICU care and heart failure service consultation. Extubated 2/9. Pt admitted to [facility] for a/c CHF/shock and acute hypoxic respiratory failure. Of note, s/p mechanical MVR in December at [facility]. PMH: anemia, autoimmune disorder, CHF, DVT, dysrhythmia, mitral regurgitation, mitral stenosis, MI, PAF, pyoderma gangrenosa, seonegative spondylitis, tricuspid regurgitation    PT Comments    Pt making excellent progress towards his physical therapy goals this session. Requiring light min assist for transfers and ambulating 150 feet with a walker at a min guard assist level. SpO2 97% on RA, HR 80-90's, BP 116/67 (82). Continues with decreased cardiopulmonary endurance and functional weakness. Will continue to follow acutely to promote mobility.    Recommendations for follow up therapy are one component of a multi-disciplinary discharge planning process, led by the attending physician.  Recommendations may be updated based on patient status, additional functional criteria and insurance authorization.  Follow Up Recommendations  Other (comment) (OP cardiac rehab)     Assistance Recommended at Discharge Frequent or constant Supervision/Assistance  Patient can return home with the following A little help with walking and/or transfers;A little help with bathing/dressing/bathroom   Equipment Recommendations  Rolling walker (2 wheels);BSC/3in1    Recommendations for Other Services       Precautions / Restrictions Precautions Precautions: Fall;Other (comment) Precaution Comments: A-line, monitor SpO2 Restrictions Weight Bearing Restrictions: No Other Position/Activity Restrictions: s/p MVR with sternotomy December 2022      Mobility  Bed Mobility Overal bed mobility: Needs Assistance Bed Mobility: Supine to Sit     Supine to sit: Supervision          Transfers Overall transfer level: Needs assistance Equipment used: Rolling walker (2 wheels) Transfers: Sit to/from Stand Sit to Stand: Min assist           General transfer comment: Light minA to power up    Ambulation/Gait Ambulation/Gait assistance: Min guard Gait Distance (Feet): 150 Feet Assistive device: Rolling walker (2 wheels) Gait Pattern/deviations: Step-through pattern, Decreased stride length Gait velocity: decreased     General Gait Details: Slow and steady pace, min guard for safety. Able to dual task during ambulation. Cues for upright posture, upward gaze   Stairs             Wheelchair Mobility    Modified Rankin (Stroke Patients Only)       Balance Overall balance assessment: Needs assistance Sitting-balance support: No upper extremity supported, Feet supported Sitting balance-Leahy Scale: Good     Standing balance support: No upper extremity supported, Bilateral upper extremity supported, Reliant on assistive device for balance, During functional activity Standing balance-Leahy Scale: Fair                              Cognition Arousal/Alertness: Awake/alert Behavior During Therapy: WFL for tasks assessed/performed Overall Cognitive Status: Impaired/Different from baseline Area of Impairment: Following commands, Problem solving                       Following Commands: Follows multi-step commands inconsistently     Problem Solving: Requires verbal cues General Comments: Pt generally flat, but occasionally smiling. Able to perform dual tasking during gait i.e. naming animals. Pt following some  multi step commands consistently, cues provided for environmental negotiation        Exercises General Exercises - Lower Extremity Long Arc Quad: Both, Seated, 5 reps Hip  Flexion/Marching: Both, 5 reps, Seated Toe Raises: Both, 10 reps, Seated Heel Raises: Both, 10 reps, Seated    General Comments General comments (skin integrity, edema, etc.): SpO2 90s on RA. HR 80-90s. BP before activity 107/74 (81) and after activity 116/67 (82).      Pertinent Vitals/Pain Pain Assessment Pain Assessment: No/denies pain    Home Living Family/patient expects to be discharged to:: Private residence Living Arrangements: Parent Available Help at Discharge: Family;Available 24 hours/day Type of Home: House Home Access: Stairs to enter Entrance Stairs-Rails: Left Entrance Stairs-Number of Steps: 4   Home Layout: One level Home Equipment: Grab bars - tub/shower;Shower seat      Prior Function            PT Goals (current goals can now be found in the care plan section) Acute Rehab PT Goals Patient Stated Goal: get stronger Potential to Achieve Goals: Good Progress towards PT goals: Progressing toward goals    Frequency    Min 3X/week      PT Plan Discharge plan needs to be updated    Co-evaluation              AM-PAC PT "6 Clicks" Mobility   Outcome Measure  Help needed turning from your back to your side while in a flat bed without using bedrails?: None Help needed moving from lying on your back to sitting on the side of a flat bed without using bedrails?: A Little Help needed moving to and from a bed to a chair (including a wheelchair)?: A Little Help needed standing up from a chair using your arms (e.g., wheelchair or bedside chair)?: A Little Help needed to walk in hospital room?: A Little Help needed climbing 3-5 steps with a railing? : A Little 6 Click Score: 19    End of Session Equipment Utilized During Treatment: Gait belt Activity Tolerance: Patient tolerated treatment well Patient left: in chair;with call bell/phone within reach Nurse Communication: Mobility status PT Visit Diagnosis: Unsteadiness on feet (R26.81);Other  abnormalities of gait and mobility (R26.89);Muscle weakness (generalized) (M62.81);Difficulty in walking, not elsewhere classified (R26.2)     Time: [id number] PT Time Calculation (min) (ACUTE ONLY): 13 min  Charges:  $Gait Training: 8-22 mins                     Wyona Almas, PT, DPT Acute Rehabilitation Services Pager [phone] Office [phone]    Deno Etienne 07/29/2021, 12:31 PM

## 2021-07-29 NOTE — Progress Notes (Signed)
[facility] ADULT ICU REPLACEMENT PROTOCOL   The patient does apply for the [facility] Adult ICU Electrolyte Replacment Protocol based on the criteria listed below:   1.Exclusion criteria: TCTS patients, ECMO patients, and Dialysis patients 2. Is GFR >/= 30 ml/min? Yes.    Patient's GFR today is >60 3. Is SCr </= 2? Yes.   Patient's SCr is 1.10 mg/dL 4. Did SCr increase >/= 0.5 in 24 hours? No. 5.Pt's weight >40kg  Yes.   6. Abnormal electrolyte(s): K and Mag  7. Electrolytes replaced per protocol 8.  Call MD STAT for K+ </= 2.5, Phos </= 1, or Mag </= 1 Physician:  Minna Antis [facility] 07/29/2021 4:38 AM

## 2021-07-30 LAB — PROTIME-INR
INR: 1.6 — ABNORMAL HIGH (ref 0.8–1.2)
Prothrombin Time: 19.4 seconds — ABNORMAL HIGH (ref 11.4–15.2)

## 2021-07-30 LAB — COOXEMETRY PANEL
Carboxyhemoglobin: 1.2 % (ref 0.5–1.5)
Carboxyhemoglobin: 1.4 % (ref 0.5–1.5)
Methemoglobin: 1 % (ref 0.0–1.5)
Methemoglobin: 1.1 % (ref 0.0–1.5)
O2 Saturation: 51.8 %
O2 Saturation: 66.1 %
Total hemoglobin: 12.9 g/dL (ref 12.0–16.0)
Total hemoglobin: 11.5 g/dL — ABNORMAL LOW (ref 12.0–16.0)

## 2021-07-30 LAB — CBC
HCT: 34.6 % — ABNORMAL LOW (ref 39.0–52.0)
Hemoglobin: 11.2 g/dL — ABNORMAL LOW (ref 13.0–17.0)
MCH: 24.9 pg — ABNORMAL LOW (ref 26.0–34.0)
MCHC: 32.4 g/dL (ref 30.0–36.0)
MCV: 76.9 fL — ABNORMAL LOW (ref 80.0–100.0)
Platelets: 303 10*3/uL (ref 150–400)
RBC: 4.5 MIL/uL (ref 4.22–5.81)
RDW: 15.9 % — ABNORMAL HIGH (ref 11.5–15.5)
WBC: 8.9 10*3/uL (ref 4.0–10.5)
nRBC: 0 % (ref 0.0–0.2)

## 2021-07-30 LAB — BASIC METABOLIC PANEL
Anion gap: 7 (ref 5–15)
BUN: 16 mg/dL (ref 6–20)
CO2: 31 mmol/L (ref 22–32)
Calcium: 9.5 mg/dL (ref 8.9–10.3)
Chloride: 95 mmol/L — ABNORMAL LOW (ref 98–111)
Creatinine, Ser: 0.98 mg/dL (ref 0.61–1.24)
GFR, Estimated: >60 mL/min (ref >60)
Glucose, Bld: 104 mg/dL — ABNORMAL HIGH (ref 70–99)
Potassium: 3.6 mmol/L (ref 3.5–5.1)
Sodium: 133 mmol/L — ABNORMAL LOW (ref 135–145)

## 2021-07-30 LAB — HEPARIN LEVEL (UNFRACTIONATED): Heparin Unfractionated: <0.1 IU/mL — ABNORMAL LOW (ref 0.30–0.70)

## 2021-07-30 LAB — GLUCOSE, CAPILLARY
Glucose-Capillary: 94 mg/dL (ref 70–99)
Glucose-Capillary: 120 mg/dL — ABNORMAL HIGH (ref 70–99)
Glucose-Capillary: 106 mg/dL — ABNORMAL HIGH (ref 70–99)
Glucose-Capillary: 108 mg/dL — ABNORMAL HIGH (ref 70–99)
Glucose-Capillary: 109 mg/dL — ABNORMAL HIGH (ref 70–99)

## 2021-07-30 LAB — MAGNESIUM: Magnesium: 2.2 mg/dL (ref 1.7–2.4)

## 2021-07-30 MED ORDER — LOSARTAN POTASSIUM 25 MG PO TABS: 12.5000 mg | ORAL_TABLET | Freq: Every day | ORAL | Status: DC

## 2021-07-30 MED ORDER — POTASSIUM CHLORIDE CRYS ER 20 MEQ PO TBCR
40.0000 meq | EXTENDED_RELEASE_TABLET | Freq: Four times a day (QID) | ORAL | Status: AC
  Administered 2021-07-30 (×2): 40 meq via ORAL
  Filled 2021-07-30 (×2): qty 2

## 2021-07-30 MED ORDER — SPIRONOLACTONE 12.5 MG HALF TABLET: 12.5000 mg | ORAL_TABLET | Freq: Every day | ORAL | Status: DC

## 2021-07-30 MED ORDER — WARFARIN SODIUM 5 MG PO TABS
8.0000 mg | ORAL_TABLET | Freq: Once | ORAL | Status: AC
  Administered 2021-07-30: 8 mg via ORAL
  Filled 2021-07-30: qty 1

## 2021-07-30 MED ORDER — SPIRONOLACTONE 12.5 MG HALF TABLET
12.5000 mg | ORAL_TABLET | Freq: Every day | ORAL | Status: DC
  Administered 2021-07-30 – 2021-08-01 (×3): 12.5 mg via ORAL
  Filled 2021-07-30 (×3): qty 1

## 2021-07-30 NOTE — Progress Notes (Addendum)
Advanced Heart Failure Rounding Note  PCP-Cardiologist: Carlyle Dolly, MD   Subjective:   Admitted with A/C HFrEF/shock. Intubated.   Transferred to [facility] from [facility] for shock/HF.   2/8 Bld CX- NGTD 2/9 Extubated  Milrinone weaned to 0.125 yesterday. Co-ox 60>>52% (early AM, refused CPAP >>repeat pending)   Diuretics held yesterday for low volume status, Doubt today's wt is accurate. RN to recheck. CVP 2   Scr stable 0.98  Na improved w/ tolvaptan, 124>>133.   K 3.6   INR 1.6. on coumadin + heparin gtt. Hgb stable, 11.2  Low BP w/ narrow pulse pressures, systolics [id number]. No dizziness but complaining of left sided "tingling" in upper and lower extremities. Just started. No other deficits. 5/5 upper and lower extremity strength bilaterally.    Objective:   Weight Range: 79.8 kg Body mass index is 25.98 kg/m.   Vital Signs:   Temp:  [97.8 F (36.6 C)-98.2 F (36.8 C)] 98.1 F (36.7 C) (02/14 0329) Pulse Rate:  [53-86] 79 (02/14 0605) Resp:  [15-31] 23 (02/14 0605) BP: (83-110)/(56-81) 87/72 (02/14 0605) SpO2:  [95 %-100 %] 98 % (02/14 0605) Weight:  [79.8 kg] 79.8 kg (02/14 0500) Last BM Date: 07/29/21  Weight change: Filed Weights   07/28/21 0600 07/29/21 0500 07/30/21 0500  Weight: 87 kg 87.8 kg 79.8 kg    Intake/Output:   Intake/Output Summary (Last 24 hours) at 07/30/2021 0727 Last data filed at 07/30/2021 0536 Gross per 24 hour  Intake 2212.18 ml  Output 5500 ml  Net -3287.82 ml      Physical Exam    CVP 2 General:  chronically ill appearing young male. No respiratory difficulty HEENT: normal Neck: supple. no JVD. + lt IJ CVC Carotids 2+ bilat; no bruits. No lymphadenopathy or thyromegaly appreciated. Cor: PMI nondisplaced. Regular rate & rhythm. + mechanical valve sounds  Lungs: clear Abdomen: soft, nontender, nondistended. No hepatosplenomegaly. No bruits or masses. Good bowel sounds. Extremities: no cyanosis, clubbing, rash,  edema Neuro: alert & oriented x 3, cranial nerves grossly intact. moves all 4 extremities w/o difficulty. Affect pleasant. 5/5 upper and lower extremity strength bilaterally    Telemetry   NSR 90s Personally reviewed   Labs    CBC Recent Labs    07/29/21 0325 07/30/21 0447  WBC 8.5 8.9  HGB 10.6* 11.2*  HCT 32.4* 34.6*  MCV 77.1* 76.9*  PLT 267 [id number]   Basic Metabolic Panel Recent Labs    07/29/21 0325 07/29/21 0903 07/29/21 1642 07/30/21 0447  NA 124*   < > 128* 133*  K 3.5   < > 3.8 3.6  CL 85*   < > 89* 95*  CO2 32   < > 31 31  GLUCOSE 127*   < > 123* 104*  BUN 22*   < > 21* 16  CREATININE 1.10   < > 1.06 0.98  CALCIUM 8.9   < > 8.9 9.5  MG 1.6*  --   --  2.2   < > = values in this interval not displayed.   Liver Function Tests Recent Labs    07/29/21 0325 07/29/21 0903  AST 239* 223*  ALT 266* 254*  ALKPHOS 162* 160*  BILITOT 1.1 1.0  PROT 9.0* 9.2*  ALBUMIN 2.8* 2.8*   No results for input(s): LIPASE, AMYLASE in the last 72 hours. Cardiac Enzymes No results for input(s): CKTOTAL, CKMB, CKMBINDEX, TROPONINI in the last 72 hours.  BNP: BNP (last 3 results) Recent Labs  07/17/21 1320 07/22/21 0248 07/24/21 1433  BNP 1,686.0* 2,708.0* 3,184.1*    ProBNP (last 3 results) No results for input(s): PROBNP in the last 8760 hours.   D-Dimer No results for input(s): DDIMER in the last 72 hours. Hemoglobin A1C No results for input(s): HGBA1C in the last 72 hours.  Fasting Lipid Panel No results for input(s): CHOL, HDL, LDLCALC, TRIG, CHOLHDL, LDLDIRECT in the last 72 hours. Thyroid Function Tests No results for input(s): TSH, T4TOTAL, T3FREE, THYROIDAB in the last 72 hours.  Invalid input(s): FREET3  Other results:   Imaging    No results found.   Medications:     Scheduled Medications:  Chlorhexidine Gluconate Cloth  6 each Topical Daily   digoxin  0.125 mg Oral Daily   docusate sodium  100 mg Oral BID   feeding supplement   237 mL Oral BID BM   Gerhardt's butt cream  1 application Topical TID   losartan  12.5 mg Oral BID   multivitamin with minerals  1 tablet Oral Daily   pantoprazole  40 mg Oral Daily   polyethylene glycol  17 g Oral Daily   sodium chloride flush  10-40 mL Intracatheter Q12H   sodium chloride flush  10-40 mL Intracatheter Q12H   spironolactone  12.5 mg Oral Daily   Warfarin - Pharmacist Dosing Inpatient   Does not apply q1600    Infusions:  sodium chloride Stopped (07/26/21 0532)   amiodarone 30 mg/hr (07/30/21 0500)   heparin 800 Units/hr (07/30/21 0500)   milrinone 0.125 mcg/kg/min (07/30/21 0500)    PRN Medications: sodium chloride, docusate sodium, Gerhardt's butt cream, polyethylene glycol, sodium chloride flush, sodium chloride flush    Patient Profile   36 y/o male with previous MV repair in 2019 due to torn chord with subsequent recurrent MR, chronic systolic HF due to biventricular HF with EF 30-35% in 3/22, PAF and recent hospitalization 12/22 for a/c CHF w/ low output in setting of severe MR, requiring stabilization w/ milrinone and transfer to [facility] for high risk mechanical Mitral valve replacement on 12/26. Now readmitted to [facility] for a/c CHF/shock and acute hypoxic respiratory failure requiring intubation.      Assessment/Plan  1. Acute on Chronic Biventricular Heart Failure>>Low Output Heart Failure   - BiV failure likely primarily valvular in nature in setting of severe MR, now s/p recent MV replacement 12/22 at [place] EF 40 to 45% with severe MR with multiple jets including a possible paravalvular leak. Miderate RV dysfunction severe TR - [facility] 3/22 showed normal coronaries, NICM EF 30-35%, severe MR w/ prominent v-waves in PCWP tracing, mild pulmonary venous HTN with normal CO (Fick cardiac output/index = 5.3/2.5). - Referred for re-do MVR 4/22 but did not follow-up w/ Dr. Roxy Manns - Admit 12/22 for shock. Echo EF 20-25%, mild LV dilation, D-shaped septum,  moderately decreased RV systolic function with mild RV enlargement, s/p MV repair with mean gradient 15 mmHg and severe MR, mild TR, dilated IVC - Transferred to [facility] and underwent mechanical MVR on 12/26 - Admitted w/ a/c CHF/shock.  Suspect acute decompensation due to recurrent Afib w/ RVR - Echo now shows LVEF <20%, RV severely reduced. Mechanical MV ok, mGradient 3 mmHg - CO-OX 52% on 0.125 milrinone. Check repeat co-ox   - Low volume status, CVP 2. Hold diuretics, may need a little fluid back  - Hold spiro and losartan w/ low BP. May need NE if low co-ox  - Continue digoxin 0.125  -  SGLT2i soon  - Discussed with [facility] team. Remains tenuous. Depending on his progress, may need to consider VAD/transplant down the road if candidate. Will need to follow closely. Discussed with patients sister at bedside.   2. Severe Mitral Valve Regurgitation s/p Mitral Valve Replacement  - previous MV repair in 2019 due to torn chord with subsequent recurrent MR - s/p mechanical MVR at [facility] 06/10/21 - repeat 2D echo w/ stable prothesis, mGradient 3 mmHg  - INR 9.6 on admit. Coumadin held. INR 1.6 today. Back on coumadin + heparin bridge. Discussed dosing with PharmD personally.    3. Acute Hypoxic Respiratory Failure, S/p Intubation  - 2/2 acute pulmonary edema - extubated 2/9 - improved with diuresis.  - need to ambulate - I educated him on IS    4. Atrial Fibrillation w/ RVR  - in setting of long standing MV disease, echo w/ severe LAE,  LA diam: 5.90 cm - Maintaining SR.  - continue amio gtt 30/hr while on milrinone  - Warfarin management as above   5. AKI  - baseline SCr ~1.0 - 1.89 on admit. Now 0.98 - suspect cardiorenal - support CO w/ milrinone  - avoid hypotension, NE if needed  - follow BMP    6. Sacral Wounds  - Multiple old and new pustules some bleeding. Open areas on sacrum (see images in CCM note)  - WOC consult   7. Hypokalemia/hypomagnesemia - K 3.6, Mag 2.2 - Supp K  today  8. Shock liver - LFTs s improving - Follow  9. Upper GI bleed - Dark OGT secretions.  - Gastroccult +  - Continue PPI - Hgb table at 10.6  10. Microcytic anemia - iron stores low - received feraheme 2/13  11. Hyponatremia - Tolvaptan given 2/13 - improved 124>>133 today  - monitor   Length of Stay: [address] PA-C  07/30/2021, 7:27 AM  Advanced Heart Failure Team Pager [phone] (M-F; 7a - 5p)  Please contact [facility] for night-coverage after hours (5p -7a ) and weekends on [email or web address]  Patient seen with PA, agree with the above note.   SBP 100s today.  Co-ox 66% on repeat.  He remains on milrinone 0.125.  CVP 3.   Walking in room without dyspnea.   General: NAD Neck: No JVD, no thyromegaly or thyroid nodule.  Lungs: Clear to auscultation bilaterally with normal respiratory effort. CV: Nondisplaced PMI.  Heart regular S1/S2 with mechanical S1, no S3/S4, 1/6 SEM RUSB.  No peripheral edema.   Abdomen: Soft, nontender, no hepatosplenomegaly, no distention.  Skin: Intact without lesions or rashes.  Neurologic: Alert and oriented x 3.  Psych: Normal affect. Extremities: No clubbing or cyanosis.  HEENT: Normal.   INR 1.6, remains on heparin gtt + warfarin with mechanical MV.   Good co-ox, will stop milrinone now and follow co-ox in am.  Continue digoxin.  Will give losartan 12.5 mg once daily and spironolactone 12.5 once daily.   Rhythm difficult, ?NSR with PACs versus AF.  - Continue amiodarone gtt.  - Anticoagulated.  - ECG now.   Good diuresis yesterday, CVP 2-3 now.  Hold Lasix today.   CRITICAL CARE Performed by: Loralie Champagne  Total critical care time: 35 minutes  Critical care time was exclusive of separately billable procedures and treating other patients.  Critical care was necessary to treat or prevent imminent or life-threatening deterioration.  Critical care was time spent personally by me on the following activities:  development of treatment plan  with patient and/or surrogate as well as nursing, discussions with consultants, evaluation of patient's response to treatment, examination of patient, obtaining history from patient or surrogate, ordering and performing treatments and interventions, ordering and review of laboratory studies, ordering and review of radiographic studies, pulse oximetry and re-evaluation of patient's condition.  Loralie Champagne 07/30/2021 1:39 PM

## 2021-07-30 NOTE — Progress Notes (Addendum)
Physical Therapy Treatment Patient Details Name: Donald [name] MRN: [id number] DOB: 1986-06-11 Today's Date: 07/30/2021   History of Present Illness 36 y.o. male who presented to [facility] 2/7 for SOB, chest pain, palpitations, and AMS. Pt intubated and transferred to [facility] for ICU care and heart failure service consultation. Extubated 2/9. Pt admitted to [facility] for a/c CHF/shock and acute hypoxic respiratory failure. Of note, s/p mechanical MVR in December at [facility]. PMH: anemia, autoimmune disorder, CHF, DVT, dysrhythmia, mitral regurgitation, mitral stenosis, MI, PAF, pyoderma gangrenosa, seonegative spondylitis, tricuspid regurgitation    PT Comments    Pt progressing well towards his physical therapy goals and is motivated to participate. Able to participate in seated warm up exercise and ambulating block around unit with a walker at a min guard assist level. SpO2 99% on RA, HR 985-93 bpm. Will continue to follow acutely for strengthening and progressive mobility.    Recommendations for follow up therapy are one component of a multi-disciplinary discharge planning process, led by the attending physician.  Recommendations may be updated based on patient status, additional functional criteria and insurance authorization.  Follow Up Recommendations  Other (comment) (OP cardiac rehab)     Assistance Recommended at Discharge Frequent or constant Supervision/Assistance  Patient can return home with the following A little help with walking and/or transfers;A little help with bathing/dressing/bathroom   Equipment Recommendations  Rolling walker (2 wheels);BSC/3in1    Recommendations for Other Services       Precautions / Restrictions Precautions Precautions: Fall;Other (comment) Precaution Comments: A-line, monitor SpO2 Restrictions Weight Bearing Restrictions: No     Mobility  Bed Mobility Overal bed mobility: Needs Assistance Bed Mobility: Supine to Sit     Supine to sit:  Supervision          Transfers Overall transfer level: Needs assistance Equipment used: Rolling walker (2 wheels) Transfers: Sit to/from Stand Sit to Stand: Min guard                Ambulation/Gait Ambulation/Gait assistance: Min guard Gait Distance (Feet): 370 Feet Assistive device: Rolling walker (2 wheels) Gait Pattern/deviations: Step-through pattern, Decreased stride length Gait velocity: decreased     General Gait Details: Slow and steady pace, min guard for safety, improved posture   Stairs             Wheelchair Mobility    Modified Rankin (Stroke Patients Only)       Balance Overall balance assessment: Needs assistance Sitting-balance support: No upper extremity supported, Feet supported Sitting balance-Leahy Scale: Good     Standing balance support: No upper extremity supported, Bilateral upper extremity supported, Reliant on assistive device for balance, During functional activity Standing balance-Leahy Scale: Fair                              Cognition Arousal/Alertness: Awake/alert Behavior During Therapy: WFL for tasks assessed/performed Overall Cognitive Status: Impaired/Different from baseline Area of Impairment: Following commands, Problem solving                       Following Commands: Follows multi-step commands inconsistently     Problem Solving: Requires verbal cues General Comments: Pt generally flat, but occasionally smiling. Pt mother reports mood has improved in comparison to PTA        Exercises General Exercises - Upper Extremity Shoulder Flexion: Both, 10 reps, Seated General Exercises - Lower Extremity Long Arc Quad: Both, Seated, 10 reps  Hip Flexion/Marching: Both, Seated, 10 reps Other Exercises Other Exercises: x3 sit to stands    General Comments        Pertinent Vitals/Pain Pain Assessment Pain Assessment: Faces Faces Pain Scale: No hurt    Home Living                           Prior Function            PT Goals (current goals can now be found in the care plan section) Acute Rehab PT Goals Patient Stated Goal: get stronger Potential to Achieve Goals: Good Progress towards PT goals: Progressing toward goals    Frequency    Min 3X/week      PT Plan Discharge plan needs to be updated    Co-evaluation              AM-PAC PT "6 Clicks" Mobility   Outcome Measure  Help needed turning from your back to your side while in a flat bed without using bedrails?: None Help needed moving from lying on your back to sitting on the side of a flat bed without using bedrails?: A Little Help needed moving to and from a bed to a chair (including a wheelchair)?: A Little Help needed standing up from a chair using your arms (e.g., wheelchair or bedside chair)?: A Little Help needed to walk in hospital room?: A Little Help needed climbing 3-5 steps with a railing? : A Little 6 Click Score: 19    End of Session Equipment Utilized During Treatment: Gait belt Activity Tolerance: Patient tolerated treatment well Patient left: in chair;with call bell/phone within reach Nurse Communication: Mobility status PT Visit Diagnosis: Unsteadiness on feet (R26.81);Other abnormalities of gait and mobility (R26.89);Muscle weakness (generalized) (M62.81);Difficulty in walking, not elsewhere classified (R26.2)     Time: [id number] PT Time Calculation (min) (ACUTE ONLY): 21 min  Charges:  $Therapeutic Activity: 8-22 mins                     Wyona Almas, PT, DPT Acute Rehabilitation Services Pager [phone] Office [phone]    Deno Etienne 07/30/2021, 1:13 PM

## 2021-07-30 NOTE — Progress Notes (Signed)
ANTICOAGULATION CONSULT NOTE  Pharmacy Consult for Heparin> warfarin  Indication: mechanical MVR   No Known Allergies  Patient Measurements: Height: 5\' 9"  (175.3 cm) Weight: 79.8 kg (175 lb 14.8 oz) IBW/kg (Calculated) : 70.7  Vital Signs: Temp: 98.1 F (36.7 C) (02/14 0329) Temp Source: Oral (02/14 0329) BP: 87/72 (02/14 0605) Pulse Rate: 79 (02/14 0605)  Labs: Recent Labs    07/28/21 0400 07/29/21 0325 07/29/21 0903 07/29/21 1642 07/30/21 0447  HGB 10.7* 10.6*  --   --  11.2*  HCT 33.0* 32.4*  --   --  34.6*  PLT 307 267  --   --  303  LABPROT 27.3* 20.8*  --   --  19.4*  INR 2.5* 1.8*  --   --  1.6*  HEPARINUNFRC  --   --   --  <0.10* <0.10*  CREATININE 1.14 1.10 1.15 1.06 0.98     Estimated Creatinine Clearance: 107.2 mL/min (by C-G formula based on SCr of 0.98 mg/dL).   Medical History: Past Medical History:  Diagnosis Date   Anemia    Autoimmune disorder (HCC)    pyoderma gangrenosum   CHF (congestive heart failure) (HCC)    Chronic systolic heart failure (HCC)    a. EF 35-40% by echo in 07/2018 b. EF at 45% by repeat echo in 04/2020   DVT (deep venous thrombosis) (HCC)    h/o   Dysrhythmia    Mitral regurgitation    a. 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 b. 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HCC)    arthritis   Tricuspid regurgitation     Assessment: 36 yo M present on 07/24/2021 with shortness of breath and found to have acute/chronic heart failure. Of note, recent history of mechanical MV replacement 12/22 at [facility]. On warfarin PTA with last dose 07/22/21. INR was 9.8 on admit, FFP given 2/8. PTA regimen: MWF 4mg , STThS 3mg  (total weekly 24mg ). Pharmacy consulted for warfarin management.   INR today is down again  today at 1.6, H/H ok. Will continue boost to prevent further drop. Will continue low dose heparin bridge 800 units/hr. Patient noted left sided weakness and tingling this morning.   No bleeding noted, however nurse this morning noticed that heparin looked to be transfusing onto floor for unknown amount of time.   Goal of Therapy:  Heparin level <0.3 INR goal: 2.5-3.5 Monitor platelets by anticoagulation protocol: Yes   Plan:  Warfarin 8mg  PO x1 tonight Heparin at 800 units/hr - no plans to titrate at this time Daily INR  4/8 PharmD., BCPS Clinical Pharmacist 07/30/2021 7:46 AM

## 2021-07-30 NOTE — Progress Notes (Signed)
Nutrition Follow-up  DOCUMENTATION CODES:   Not applicable  INTERVENTION:   D/C Ensure Enlive  Continue Regular diet; allow double portions  Continue MVI with Minerals   NUTRITION DIAGNOSIS:   Increased nutrient needs related to chronic illness as evidenced by estimated needs.  Being addressed via diet advancement  GOAL:   Patient will meet greater than or equal to 90% of their needs  Progressing  MONITOR:   PO intake, Supplement acceptance, Diet advancement, Labs  REASON FOR ASSESSMENT:   Ventilator    ASSESSMENT:   36 yo male admitted with acute on chronic CHF, cardiogenic shock. PMH includes severe MV regurgitation s/p MV replacement at [facility] Dec 2022, Seronegative spondylitis, Pyoderma gangrenosum  2/09 Extubated  Pt with good appetite; recorded po intake 30-100% of meals, >75% on average.   Pt does not like the Ensure supplements and not drinking  Current wt 86 kg; admit weight 87.7 kg. Weight is down per weight encounters; noted pt with hospitalization in December 2022  Labs: sodium 133 (L), Creatinine wdl Meds: MVI with Minerals,KCl, colace    Diet Order:   Diet Order             Diet regular Room service appropriate? Yes; Fluid consistency: Thin  Diet effective now                   EDUCATION NEEDS:   Not appropriate for education at this time  Skin:  Skin Assessment: Skin Integrity Issues: Skin Integrity Issues:: Other (Comment) Other: open wounds on buttocks/sacrum-hidradenitis suppurativa  Last BM:  2/13  Height:   Ht Readings from Last 1 Encounters:  07/24/21 5\' 9"  (1.753 m)    Weight:   Wt Readings from Last 1 Encounters:  07/30/21 86 kg     BMI:  Body mass index is 28 kg/m.  Estimated Nutritional Needs:   Kcal:  2200-2400 kcals  Protein:  110-125 g  Fluid:  1.8 L   08/01/21 MS, RDN, LDN, CNSC Registered Dietitian III Clinical Nutrition RD Pager and On-Call Pager Number Located in [place]

## 2021-07-30 NOTE — TOC Initial Note (Signed)
Transition of Care [facility]) - Initial/Assessment Note    Patient Details  Name: Donald [name] MRN: [id number] Date of Birth: Jun 26, 1985  Transition of Care [facility]) CM/SW Contact:    Donald Cousin, RN Phone Number: [phone] 07/30/2021, 3:42 PM  Clinical Narrative:                 HF TOC CM spoke to pt at bedside. Pt will be going to IP rehab and home with father. States he was working full-time. He has completed Medicaid and Disability paperwork. CIR following for admission to IP rehab once medically stable.   Expected Discharge Plan: IP Rehab Facility Barriers to Discharge: Continued Medical Work up   Patient Goals and CMS Choice Patient states their goals for this hospitalization and ongoing recovery are:: wants to get better CMS Medicare.gov Compare Post Acute Care list provided to:: Patient Choice offered to / list presented to : Patient  Expected Discharge Plan and Services Expected Discharge Plan: IP Rehab Facility In-house Referral: Clinical Social Work Discharge Planning Services: CM Consult Post Acute Care Choice: IP Rehab Living arrangements for the past 2 months: Single Family Home                                      Prior Living Arrangements/Services Living arrangements for the past 2 months: Single Family Home Lives with:: Parents Patient language and need for interpreter reviewed:: Yes Do you feel safe going back to the place where you live?: Yes      Need for Family Participation in Patient Care: Yes (Comment) Care giver support system in place?: Yes (comment)   Criminal Activity/Legal Involvement Pertinent to Current Situation/Hospitalization: No - Comment as needed  Activities of Daily Living Home Assistive Devices/Equipment: None ADL Screening (condition at time of admission) Patient's cognitive ability adequate to safely complete daily activities?: Yes Is the patient deaf or have difficulty hearing?: No Does the patient have  difficulty seeing, even when wearing glasses/contacts?: No Does the patient have difficulty concentrating, remembering, or making decisions?: No Patient able to express need for assistance with ADLs?: No Does the patient have difficulty dressing or bathing?: No Independently performs ADLs?: Yes (appropriate for developmental age) Does the patient have difficulty walking or climbing stairs?: No Weakness of Legs: Both Weakness of Arms/Hands: None  Permission Sought/Granted Permission sought to share information with : Case Manager, Family Supports, PCP Permission granted to share information with : Yes, Verbal Permission Granted  Share Information with NAME: Donald [name]     Permission granted to share info w Relationship: father  Permission granted to share info w Contact Information: [phone]  Emotional Assessment Appearance:: Appears stated age Attitude/Demeanor/Rapport: Engaged Affect (typically observed): Accepting Orientation: : Oriented to Self, Oriented to Place, Oriented to  Time, Oriented to Situation   Psych Involvement: No (comment)  Admission diagnosis:  Acute hypoxemic respiratory failure (HCC) [J96.01] Patient Active Problem List   Diagnosis Date Noted   Cardiogenic shock (HCC)    Acute hypoxemic respiratory failure (HCC) 07/24/2021   Encounter for central line placement    S/P MVR (mitral valve replacement) 06/20/2021   Encounter for therapeutic drug monitoring 06/20/2021   Acute exacerbation of CHF (congestive heart failure) (HCC) 05/30/2021   Acute respiratory failure with hypoxia (HCC) 05/30/2021   Hypokalemia 05/30/2021   Elevated brain natriuretic peptide (BNP) level 05/30/2021   Elevated troponin 05/30/2021  AKI (acute kidney injury) (HCC) 05/30/2021   Hypoalbuminemia due to protein-calorie malnutrition (HCC) 05/30/2021   Prolonged QT interval 05/30/2021   Elevated AST (SGOT) 05/30/2021   Paroxysmal atrial flutter (HCC) 05/30/2021   Acute  systolic CHF (congestive heart failure) (HCC) 05/30/2021   Acute on chronic systolic and diastolic heart failure, NYHA class 3 (HCC) 05/30/2021   [place]    Chronic periodontitis    Retained dental root    Tricuspid regurgitation    Mitral regurgitation    Acute on chronic HFrEF (heart failure with reduced ejection fraction) (HCC)    Autoimmune disorder (HCC)    Seronegative spondylitis (HCC)    PCP:  Patient, No Pcp Per (Inactive) Pharmacy:   [facility] [address] [place] - [address] [place] [address] [place] [address] Phone: [phone] Fax: [phone]     Social Determinants of Health (SDOH) Interventions    Readmission Risk Interventions No flowsheet data found.

## 2021-07-30 NOTE — Progress Notes (Signed)
Pt stated he can't sleep with the Bipap machine on, pt doesn't want to wear Bipap anymore tonight.

## 2021-07-31 ENCOUNTER — Inpatient Hospital Stay: Payer: Self-pay

## 2021-07-31 ENCOUNTER — Other Ambulatory Visit (HOSPITAL_COMMUNITY): Payer: Self-pay

## 2021-07-31 LAB — BASIC METABOLIC PANEL
Anion gap: 6 (ref 5–15)
BUN: 12 mg/dL (ref 6–20)
CO2: 27 mmol/L (ref 22–32)
Calcium: 9 mg/dL (ref 8.9–10.3)
Chloride: 102 mmol/L (ref 98–111)
Creatinine, Ser: 0.86 mg/dL (ref 0.61–1.24)
GFR, Estimated: >60 mL/min (ref >60)
Glucose, Bld: 94 mg/dL (ref 70–99)
Potassium: 4 mmol/L (ref 3.5–5.1)
Sodium: 135 mmol/L (ref 135–145)

## 2021-07-31 LAB — COOXEMETRY PANEL
Carboxyhemoglobin: <2.2 % — ABNORMAL HIGH (ref 0.5–1.5)
Carboxyhemoglobin: <2.2 % — ABNORMAL HIGH (ref 0.5–1.5)
Carboxyhemoglobin: <2.2 % — ABNORMAL HIGH (ref 0.5–1.5)
Methemoglobin: 0.6 % (ref 0.0–1.5)
Methemoglobin: 0.5 % (ref 0.0–1.5)
Methemoglobin: <0.2 % (ref 0.0–1.5)
O2 Saturation: 79.8 %
O2 Saturation: 49.6 %
O2 Saturation: 98.6 %
Total hemoglobin: 11.4 g/dL — ABNORMAL LOW (ref 12.0–16.0)
Total hemoglobin: 12.1 g/dL (ref 12.0–16.0)
Total hemoglobin: 12 g/dL (ref 12.0–16.0)

## 2021-07-31 LAB — CBC
HCT: 34 % — ABNORMAL LOW (ref 39.0–52.0)
Hemoglobin: 11 g/dL — ABNORMAL LOW (ref 13.0–17.0)
MCH: 25 pg — ABNORMAL LOW (ref 26.0–34.0)
MCHC: 32.4 g/dL (ref 30.0–36.0)
MCV: 77.3 fL — ABNORMAL LOW (ref 80.0–100.0)
Platelets: 284 10*3/uL (ref 150–400)
RBC: 4.4 MIL/uL (ref 4.22–5.81)
RDW: 15.9 % — ABNORMAL HIGH (ref 11.5–15.5)
WBC: 9.1 10*3/uL (ref 4.0–10.5)
nRBC: 0 % (ref 0.0–0.2)

## 2021-07-31 LAB — PROTIME-INR
INR: 2.2 — ABNORMAL HIGH (ref 0.8–1.2)
Prothrombin Time: 24.8 seconds — ABNORMAL HIGH (ref 11.4–15.2)

## 2021-07-31 LAB — HEPARIN LEVEL (UNFRACTIONATED): Heparin Unfractionated: 0.12 IU/mL — ABNORMAL LOW (ref 0.30–0.70)

## 2021-07-31 LAB — GLUCOSE, CAPILLARY
Glucose-Capillary: 110 mg/dL — ABNORMAL HIGH (ref 70–99)
Glucose-Capillary: 127 mg/dL — ABNORMAL HIGH (ref 70–99)
Glucose-Capillary: 151 mg/dL — ABNORMAL HIGH (ref 70–99)
Glucose-Capillary: 90 mg/dL (ref 70–99)
Glucose-Capillary: 90 mg/dL (ref 70–99)

## 2021-07-31 MED ORDER — DAPAGLIFLOZIN PROPANEDIOL 10 MG PO TABS
10.0000 mg | ORAL_TABLET | Freq: Every day | ORAL | Status: DC
  Administered 2021-07-31 – 2021-08-03 (×4): 10 mg via ORAL
  Filled 2021-07-31 (×4): qty 1

## 2021-07-31 MED ORDER — AMIODARONE HCL 200 MG PO TABS
200.0000 mg | ORAL_TABLET | Freq: Two times a day (BID) | ORAL | Status: DC
  Administered 2021-07-31 – 2021-08-03 (×7): 200 mg via ORAL
  Filled 2021-07-31 (×7): qty 1

## 2021-07-31 MED ORDER — WARFARIN SODIUM 2 MG PO TABS
4.0000 mg | ORAL_TABLET | Freq: Once | ORAL | Status: AC
  Administered 2021-07-31: 4 mg via ORAL
  Filled 2021-07-31: qty 2

## 2021-07-31 MED ORDER — AMIODARONE HCL 200 MG PO TABS
200.0000 mg | ORAL_TABLET | Freq: Every day | ORAL | Status: DC
  Filled 2021-07-31: qty 1

## 2021-07-31 MED ORDER — AMIODARONE HCL IN DEXTROSE 360-4.14 MG/200ML-% IV SOLN: 30.0000 mg/h | INTRAVENOUS | Status: DC

## 2021-07-31 NOTE — Progress Notes (Signed)
ANTICOAGULATION CONSULT NOTE  Pharmacy Consult for Heparin> warfarin  Indication: mechanical MVR   No Known Allergies  Patient Measurements: Height: 5\' 9"  (175.3 cm) Weight: 86.6 kg (190 lb 14.7 oz) IBW/kg (Calculated) : 70.7  Vital Signs: Temp: 97.8 F (36.6 C) (02/15 0751) Temp Source: Oral (02/15 0751) BP: 107/87 (02/15 0900) Pulse Rate: 89 (02/15 0900)  Labs: Recent Labs    07/29/21 0325 07/29/21 0903 07/29/21 1642 07/30/21 0447 07/31/21 0450  HGB 10.6*  --   --  11.2* 11.0*  HCT 32.4*  --   --  34.6* 34.0*  PLT 267  --   --  303 284  LABPROT 20.8*  --   --  19.4* 24.8*  INR 1.8*  --   --  1.6* 2.2*  HEPARINUNFRC  --   --  <0.10* <0.10* 0.12*  CREATININE 1.10   < > 1.06 0.98 0.86   < > = values in this interval not displayed.     Estimated Creatinine Clearance: 133.2 mL/min (by C-G formula based on SCr of 0.86 mg/dL).   Medical History: Past Medical History:  Diagnosis Date   Anemia    Autoimmune disorder ([place])    pyoderma gangrenosum   CHF (congestive heart failure) ([place])    Chronic systolic heart failure ([place])    a. EF 35-40% by echo in 07/2018 b. EF at 45% by repeat echo in 04/2020   DVT (deep venous thrombosis) (HCC)    h/o   Dysrhythmia    Mitral regurgitation    a. 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 b. 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HCC)    arthritis   Tricuspid regurgitation     Assessment: 36 yo M present on 07/24/2021 with shortness of breath and found to have acute/chronic heart failure. Of note, recent history of mechanical MV replacement 12/22 at [facility]. On warfarin PTA with last dose 07/22/21. INR was 9.8 on admit, FFP given 2/8. PTA regimen: MWF 4mg , STThS 3mg  (total weekly 24mg ). Pharmacy consulted for  warfarin management.   INR now trending up today at 2.2, H/H ok. Will stop low dose heparin bridge. Patient noted left sided weakness and tingling this morning completely resolved later that morning.   INR goal: 2.5-3.5 Monitor platelets by anticoagulation protocol: Yes   Plan:  Warfarin 4mg  PO x1 tonight Stop heparin Daily INR  Erin Hearing PharmD., BCPS Clinical Pharmacist 07/31/2021 11:04 AM

## 2021-07-31 NOTE — Plan of Care (Signed)

## 2021-07-31 NOTE — Progress Notes (Signed)
Peripherally Inserted Central Catheter Placement  The IV Nurse has discussed with the patient and/or persons authorized to consent for the patient, the purpose of this procedure and the potential benefits and risks involved with this procedure.  The benefits include less needle sticks, lab draws from the catheter, and the patient may be discharged home with the catheter. Risks include, but not limited to, infection, bleeding, blood clot (thrombus formation), and puncture of an artery; nerve damage and irregular heartbeat and possibility to perform a PICC exchange if needed/ordered by physician.  Alternatives to this procedure were also discussed.  Bard Power PICC patient education guide, fact sheet on infection prevention and patient information card has been provided to patient /or left at bedside.    PICC Placement Documentation  PICC Double Lumen 07/31/21 Right Brachial 40 cm 0 cm (Active)  Indication for Insertion or Continuance of Line Limited venous access - need for IV therapy >5 days (PICC only) 07/31/21 1839  Exposed Catheter (cm) 2 cm 07/31/21 1839  Site Assessment Clean, Dry, Intact 07/31/21 1839  Lumen #1 Status Flushed;Blood return noted;Saline locked 07/31/21 1839  Lumen #2 Status Flushed;Blood return noted;Saline locked 07/31/21 1839  Dressing Type Securing device 07/31/21 1839  Dressing Status Antimicrobial disc in place 07/31/21 1839  Dressing Change Due 08/07/21 07/31/21 1839       Scotty Court 07/31/2021, 6:42 PM

## 2021-07-31 NOTE — Progress Notes (Addendum)
Advanced Heart Failure Rounding Note  PCP-Cardiologist: Carlyle Dolly, MD   Subjective:   Admitted with A/C HFrEF/shock. Intubated.   Transferred to [facility] from [facility] for shock/HF.   2/8 Bld CX- NGTD 2/9 Extubated 2/14 Milrinone stopped  Now off milrinone. Co-ox resulted 80% (? If accurate, repeat pending)   CVP 7   BP remains soft. Losartan held last PM due to low BP. SBP low 100s this morning.   Scr stable 0.86 CO2 27 K 4.0   INR 2.2. Hgb stable 11.0   NSR w/ PACs on tele.   Feeling better. Denies dyspnea. No orthopnea/PND. No chest pain.    Objective:   Weight Range: 86.6 kg Body mass index is 28.19 kg/m.   Vital Signs:   Temp:  [97.8 F (36.6 C)-98.3 F (36.8 C)] 97.8 F (36.6 C) (02/15 0751) Pulse Rate:  [76-91] 81 (02/15 0800) Resp:  [13-29] 24 (02/15 0800) BP: (83-127)/(55-104) 108/85 (02/15 0800) SpO2:  [91 %-100 %] 98 % (02/15 0800) Weight:  [86.6 kg] 86.6 kg (02/15 0500) Last BM Date : 07/30/21  Weight change: Filed Weights   07/30/21 0700 07/30/21 0800 07/31/21 0500  Weight: 86 kg 86 kg 86.6 kg    Intake/Output:   Intake/Output Summary (Last 24 hours) at 07/31/2021 0841 Last data filed at 07/31/2021 0800 Gross per 24 hour  Intake 2085.02 ml  Output 3200 ml  Net -1114.98 ml      Physical Exam    CVP 7  General:  Well appearing young male. No respiratory difficulty HEENT: normal Neck: supple. JVD 7 cm. Lt IJ CVC Carotids 2+ bilat; no bruits. No lymphadenopathy or thyromegaly appreciated. Cor: PMI nondisplaced. Regular rate & rhythm. + mechanical valve sounds  Abdomen: soft, nontender, nondistended. No hepatosplenomegaly. No bruits or masses. Good bowel sounds. Extremities: no cyanosis, clubbing, rash, edema Neuro: alert & oriented x 3, cranial nerves grossly intact. moves all 4 extremities w/o difficulty. Affect pleasant.   Telemetry   NSR w/ PACs 90s Personally reviewed   Labs    CBC Recent Labs    07/30/21 0447  07/31/21 0450  WBC 8.9 9.1  HGB 11.2* 11.0*  HCT 34.6* 34.0*  MCV 76.9* 77.3*  PLT 303 [id number]   Basic Metabolic Panel Recent Labs    07/29/21 0325 07/29/21 0903 07/30/21 0447 07/31/21 0450  NA 124*   < > 133* 135  K 3.5   < > 3.6 4.0  CL 85*   < > 95* 102  CO2 32   < > 31 27  GLUCOSE 127*   < > 104* 94  BUN 22*   < > 16 12  CREATININE 1.10   < > 0.98 0.86  CALCIUM 8.9   < > 9.5 9.0  MG 1.6*  --  2.2  --    < > = values in this interval not displayed.   Liver Function Tests Recent Labs    07/29/21 0325 07/29/21 0903  AST 239* 223*  ALT 266* 254*  ALKPHOS 162* 160*  BILITOT 1.1 1.0  PROT 9.0* 9.2*  ALBUMIN 2.8* 2.8*   No results for input(s): LIPASE, AMYLASE in the last 72 hours. Cardiac Enzymes No results for input(s): CKTOTAL, CKMB, CKMBINDEX, TROPONINI in the last 72 hours.  BNP: BNP (last 3 results) Recent Labs    07/17/21 1320 07/22/21 0248 07/24/21 1433  BNP 1,686.0* 2,708.0* 3,184.1*    ProBNP (last 3 results) No results for input(s): PROBNP in the last 8760 hours.  D-Dimer No results for input(s): DDIMER in the last 72 hours. Hemoglobin A1C No results for input(s): HGBA1C in the last 72 hours.  Fasting Lipid Panel No results for input(s): CHOL, HDL, LDLCALC, TRIG, CHOLHDL, LDLDIRECT in the last 72 hours. Thyroid Function Tests No results for input(s): TSH, T4TOTAL, T3FREE, THYROIDAB in the last 72 hours.  Invalid input(s): FREET3  Other results:   Imaging    No results found.   Medications:     Scheduled Medications:  Chlorhexidine Gluconate Cloth  6 each Topical Daily   digoxin  0.125 mg Oral Daily   docusate sodium  100 mg Oral BID   Gerhardt's butt cream  1 application Topical TID   losartan  12.5 mg Oral QHS   multivitamin with minerals  1 tablet Oral Daily   pantoprazole  40 mg Oral Daily   polyethylene glycol  17 g Oral Daily   sodium chloride flush  10-40 mL Intracatheter Q12H   sodium chloride flush  10-40 mL  Intracatheter Q12H   spironolactone  12.5 mg Oral Daily   Warfarin - Pharmacist Dosing Inpatient   Does not apply q1600    Infusions:  sodium chloride Stopped (07/26/21 0532)   amiodarone 30 mg/hr (07/31/21 0800)   heparin 800 Units/hr (07/31/21 0800)    PRN Medications: sodium chloride, docusate sodium, Gerhardt's butt cream, polyethylene glycol, sodium chloride flush, sodium chloride flush    Patient Profile   36 y/o male with previous MV repair in 2019 due to torn chord with subsequent recurrent MR, chronic systolic HF due to biventricular HF with EF 30-35% in 3/22, PAF and recent hospitalization 12/22 for a/c CHF w/ low output in setting of severe MR, requiring stabilization w/ milrinone and transfer to [facility] for high risk mechanical Mitral valve replacement on 12/26. Now readmitted to [facility] for a/c CHF/shock and acute hypoxic respiratory failure requiring intubation.      Assessment/Plan  1. Acute on Chronic Biventricular Heart Failure>>Low Output Heart Failure   - BiV failure likely primarily valvular in nature in setting of severe MR, now s/p recent MV replacement 12/22 at [place] EF 40 to 45% with severe MR with multiple jets including a possible paravalvular leak. Miderate RV dysfunction severe TR - [facility] 3/22 showed normal coronaries, NICM EF 30-35%, severe MR w/ prominent v-waves in PCWP tracing, mild pulmonary venous HTN with normal CO (Fick cardiac output/index = 5.3/2.5). - Referred for re-do MVR 4/22 but did not follow-up w/ Dr. Roxy Manns - Admit 12/22 for shock. Echo EF 20-25%, mild LV dilation, D-shaped septum, moderately decreased RV systolic function with mild RV enlargement, s/p MV repair with mean gradient 15 mmHg and severe MR, mild TR, dilated IVC - Transferred to [facility] and underwent mechanical MVR on 12/26 - Admitted w/ a/c CHF/shock.  Suspect acute decompensation due to recurrent Afib w/ RVR - Echo now shows LVEF <20%, RV severely reduced. Mechanical MV ok,  mGradient 3 mmHg - Now off Milrinone. Co-ox pending  - Volume status ok, CVP 7 - Intolerant of losartan w/ low BP  - Continue Spiro 12.5 mg daily  - Continue digoxin 0.125  - Start Farxiga 10 mg daily  - Discussed with [facility] team. Remains tenuous. Depending on his progress, may need to consider VAD/transplant down the road if candidate. Will need to follow closely. Discussed with patients sister at bedside.   2. Severe Mitral Valve Regurgitation s/p Mitral Valve Replacement  - previous MV repair in 2019 due to torn chord  with subsequent recurrent MR - s/p mechanical MVR at [facility] 06/10/21 - repeat 2D echo w/ stable prothesis, mGradient 3 mmHg  - INR 9.6 on admit. Coumadin held initially. Now back on coumadin + heparin bridge. INR 2.2 today. Discussed dosing with PharmD personally.    3. Acute Hypoxic Respiratory Failure, S/p Intubation  - 2/2 acute pulmonary edema - extubated 2/9 - improved with diuresis.  - need to ambulate - I educated him on IS    4. Atrial Fibrillation w/ RVR  - in setting of long standing MV disease, echo w/ severe LAE,  LA diam: 5.90 cm - Maintaining SR w/ PACs - stop amio gtt now off milrinone - Start PO amio 200 mg bid   - Warfarin management as above   5. AKI  - baseline SCr ~1.0 - 1.89 on admit. Now 0.98 - suspect cardiorenal, improved w/ inotropes  - avoid hypotension - follow BMP    6. Sacral Wounds/ Pyoderma gangrenosum - Multiple old and new pustules some bleeding. Open areas on sacrum (see images in CCM note)  - previously followed at [facility]   7. Hypokalemia/hypomagnesemia - K 4.0, Mag 2.2 - Supp PRN   8. Shock liver - LFTs s improving - Follow  9. Upper GI bleed - Dark OGT secretions.  - Gastroccult +  - Continue PPI - Hgb table at 11  10. Microcytic anemia - iron stores low - received feraheme 2/13  11. Hyponatremia - Tolvaptan given 2/13 - improved 135 today  - monitor    Keep central line and transfer to [facility] today.    Length of Stay: [address], PA-C  07/31/2021, 8:41 AM  Advanced Heart Failure Team Pager [phone] (M-F; 7a - 5p)  Please contact [facility] for night-coverage after hours (5p -7a ) and weekends on [email or web address]  Patient seen and examined with the above-signed Advanced Practice Provider and/or Housestaff. I personally reviewed laboratory data, imaging studies and relevant notes. I independently examined the patient and formulated the important aspects of the plan. I have edited the note to reflect any of my changes or salient points. I have personally discussed the plan with the patient and/or family.  Off milrinone. More alert. CVP 7. Maintaining NSR on IV amio INR up to 2.2  General:  Well appearing. No resp difficulty HEENT: normal Neck: supple. no JVD. Carotids 2+ bilat; no bruits. No lymphadenopathy or thryomegaly appreciated. Cor: PMI nondisplaced. Regular rate & rhythm. Mechanical s1 Lungs: clear Abdomen: soft, nontender, nondistended. No hepatosplenomegaly. No bruits or masses. Good bowel sounds. Extremities: no cyanosis, clubbing, rash, edema Neuro: alert & orientedx3, cranial nerves grossly intact. moves all 4 extremities w/o difficulty. Affect pleasant  Co-ox stable off milrinone. Volume status ok. Continue to titrate GDMT. Switch amio to po. Can stop heparin. Continue warfarin.  Can move out of ICU. Continue to follow CVP and co-ox.   Glori Bickers, MD  6:07 PM

## 2021-07-31 NOTE — Progress Notes (Signed)
Patient's RN consult for tPa on central line due to occlusion one line & the other two lines are sluggish flushing. Brown port was occluded since yesterday with blood in the line. Informed Sarah RN that can't instill tPa in the occluded line. Sarah RN will contact MD regarding this matter and will address proper intravascular access. HS [employer]

## 2021-08-01 LAB — CBC
HCT: 32 % — ABNORMAL LOW (ref 39.0–52.0)
Hemoglobin: 10.3 g/dL — ABNORMAL LOW (ref 13.0–17.0)
MCH: 25.2 pg — ABNORMAL LOW (ref 26.0–34.0)
MCHC: 32.2 g/dL (ref 30.0–36.0)
MCV: 78.2 fL — ABNORMAL LOW (ref 80.0–100.0)
Platelets: 267 10*3/uL (ref 150–400)
RBC: 4.09 MIL/uL — ABNORMAL LOW (ref 4.22–5.81)
RDW: 16.2 % — ABNORMAL HIGH (ref 11.5–15.5)
WBC: 9.4 10*3/uL (ref 4.0–10.5)
nRBC: 0 % (ref 0.0–0.2)

## 2021-08-01 LAB — HEPATIC FUNCTION PANEL
ALT: 103 U/L — ABNORMAL HIGH (ref 0–44)
AST: 83 U/L — ABNORMAL HIGH (ref 15–41)
Albumin: 2.5 g/dL — ABNORMAL LOW (ref 3.5–5.0)
Alkaline Phosphatase: 153 U/L — ABNORMAL HIGH (ref 38–126)
Bilirubin, Direct: 0.3 mg/dL — ABNORMAL HIGH (ref 0.0–0.2)
Indirect Bilirubin: 0.5 mg/dL (ref 0.3–0.9)
Total Bilirubin: 0.8 mg/dL (ref 0.3–1.2)
Total Protein: 7.9 g/dL (ref 6.5–8.1)

## 2021-08-01 LAB — BASIC METABOLIC PANEL
Anion gap: 7 (ref 5–15)
BUN: 16 mg/dL (ref 6–20)
CO2: 24 mmol/L (ref 22–32)
Calcium: 8.6 mg/dL — ABNORMAL LOW (ref 8.9–10.3)
Chloride: 101 mmol/L (ref 98–111)
Creatinine, Ser: 1.04 mg/dL (ref 0.61–1.24)
GFR, Estimated: >60 mL/min (ref >60)
Glucose, Bld: 182 mg/dL — ABNORMAL HIGH (ref 70–99)
Potassium: 3.5 mmol/L (ref 3.5–5.1)
Sodium: 132 mmol/L — ABNORMAL LOW (ref 135–145)

## 2021-08-01 LAB — GLUCOSE, CAPILLARY
Glucose-Capillary: 88 mg/dL (ref 70–99)
Glucose-Capillary: 98 mg/dL (ref 70–99)
Glucose-Capillary: 103 mg/dL — ABNORMAL HIGH (ref 70–99)
Glucose-Capillary: 117 mg/dL — ABNORMAL HIGH (ref 70–99)

## 2021-08-01 LAB — COOXEMETRY PANEL
Carboxyhemoglobin: <2.2 % — ABNORMAL HIGH (ref 0.5–1.5)
Methemoglobin: 1.2 % (ref 0.0–1.5)
O2 Saturation: 68.3 %
Total hemoglobin: 10.7 g/dL — ABNORMAL LOW (ref 12.0–16.0)

## 2021-08-01 LAB — PROTIME-INR
INR: 2.6 — ABNORMAL HIGH (ref 0.8–1.2)
Prothrombin Time: 27.5 seconds — ABNORMAL HIGH (ref 11.4–15.2)

## 2021-08-01 LAB — MAGNESIUM: Magnesium: 1.5 mg/dL — ABNORMAL LOW (ref 1.7–2.4)

## 2021-08-01 MED ORDER — LOSARTAN POTASSIUM 25 MG PO TABS
12.5000 mg | ORAL_TABLET | Freq: Every day | ORAL | Status: DC
  Administered 2021-08-01 – 2021-08-03 (×3): 12.5 mg via ORAL
  Filled 2021-08-01 (×4): qty 1

## 2021-08-01 MED ORDER — POTASSIUM CHLORIDE CRYS ER 20 MEQ PO TBCR
40.0000 meq | EXTENDED_RELEASE_TABLET | Freq: Once | ORAL | Status: AC
  Administered 2021-08-01: 40 meq via ORAL
  Filled 2021-08-01: qty 2

## 2021-08-01 MED ORDER — MAGNESIUM SULFATE 4 GM/100ML IV SOLN
4.0000 g | Freq: Once | INTRAVENOUS | Status: AC
  Administered 2021-08-01: 4 g via INTRAVENOUS
  Filled 2021-08-01: qty 100

## 2021-08-01 MED ORDER — WARFARIN SODIUM 4 MG PO TABS
4.0000 mg | ORAL_TABLET | Freq: Once | ORAL | Status: AC
  Administered 2021-08-01: 4 mg via ORAL
  Filled 2021-08-01: qty 1

## 2021-08-01 NOTE — Progress Notes (Signed)
Donald [name] for warfarin  Indication: mechanical MVR   No Known Allergies  Patient Measurements: Height: 5\' 9"  (175.3 cm) Weight: 86.6 kg (190 lb 14.7 oz) IBW/kg (Calculated) : 70.7  Vital Signs: Temp: 98.3 F (36.8 C) (02/16 0430) Temp Source: Oral (02/16 0430) BP: 111/80 (02/16 0430) Pulse Rate: 85 (02/16 0430)  Labs: Recent Labs    07/29/21 1642 07/30/21 0447 07/30/21 0447 07/31/21 0450 08/01/21 0540  HGB  --  11.2*   < > 11.0* 10.3*  HCT  --  34.6*  --  34.0* 32.0*  PLT  --  303  --  284 267  LABPROT  --  19.4*  --  24.8* 27.5*  INR  --  1.6*  --  2.2* 2.6*  HEPARINUNFRC <0.10* <0.10*  --  0.12*  --   CREATININE 1.06 0.98  --  0.86  --    < > = values in this interval not displayed.     Estimated Creatinine Clearance: 133.2 mL/min (by C-G formula based on SCr of 0.86 mg/dL).   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 EF 35-40% by echo in 07/2018 b. EF at 45% by repeat echo in 04/2020   DVT (deep venous thrombosis) ([facility])    h/o   Dysrhythmia    Mitral regurgitation    a. 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 b. 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[facility])    arthritis   Tricuspid regurgitation     Assessment: 36 yo M present on 07/24/2021 with shortness of breath and found to have acute/chronic heart failure. Of note, recent history of mechanical MV replacement 12/22 at [facility]. On warfarin PTA with last dose 07/22/21. INR was 9.8 on admit, FFP given 2/8. PTA regimen: MWF 4mg , STThS 3mg  (total weekly 24mg ). Pharmacy consulted for warfarin  management.   INR 2.6, therapeutic  CBC stable, no s/s of bleeding noted by patient.  INR goal: 2.5-3.5 Monitor platelets by anticoagulation protocol: Yes   Plan:  Warfarin 4mg  PO x1 tonight Daily INR  Thank you for allowing pharmacy to participate in this patient's care.  Levonne Spiller, PharmD PGY1 Acute Care Resident  08/01/2021,6:52 AM

## 2021-08-01 NOTE — Progress Notes (Signed)
Patient refused BiPAP for the night. States it keeps him awake all night. Placed patient on a 2 Lpm nasal cannula for the night for any desaturations. RN made aware.

## 2021-08-01 NOTE — Progress Notes (Addendum)
Advanced Heart Failure Rounding Note  PCP-Cardiologist: Carlyle Dolly, MD   Subjective:   Admitted with A/C HFrEF/shock. Intubated.   Transferred to [facility] from [facility] for shock/HF.   2/8 Bld CX- NGTD 2/9 Extubated 2/14 Milrinone stopped  Co-ox 68% off milrinone.   CVP 5-6  Feeling well. No dyspnea. Has been up ambulating.     Objective:   Weight Range: 86.6 kg Body mass index is 28.19 kg/m.   Vital Signs:   Temp:  [97.9 F (36.6 C)-98.9 F (37.2 C)] 98.1 F (36.7 C) (02/16 0734) Pulse Rate:  [80-95] 83 (02/16 0734) Resp:  [19-29] 23 (02/16 0734) BP: (100-113)/(66-87) 109/74 (02/16 0734) SpO2:  [96 %-100 %] 98 % (02/16 0734) Last BM Date : 07/30/21  Weight change: Filed Weights   07/30/21 0700 07/30/21 0800 07/31/21 0500  Weight: 86 kg 86 kg 86.6 kg    Intake/Output:   Intake/Output Summary (Last 24 hours) at 08/01/2021 0825 Last data filed at 08/01/2021 0735 Gross per 24 hour  Intake 943 ml  Output 2275 ml  Net -1332 ml      Physical Exam    CVP 5-6 General:  Well appearing. No resp difficulty HEENT: normal Neck: supple. no JVD. Carotids 2+ bilat; no bruits.  Cor: PMI nondisplaced. Regular rate & rhythm. Mechanical S1. No rubs, gallops or murmurs. Lungs: clear Abdomen: soft, nontender, nondistended. Extremities: no cyanosis, clubbing, rash, edema, + RUE PICC Neuro: alert & orientedx3, cranial nerves grossly intact. moves all 4 extremities w/o difficulty. Affect pleasant     Telemetry   SR 80s-90s   Labs    CBC Recent Labs    07/31/21 0450 08/01/21 0540  WBC 9.1 9.4  HGB 11.0* 10.3*  HCT 34.0* 32.0*  MCV 77.3* 78.2*  PLT 284 [id number]   Basic Metabolic Panel Recent Labs    07/30/21 0447 07/31/21 0450 08/01/21 0540  NA 133* 135 132*  K 3.6 4.0 3.5  CL 95* 102 101  CO2 31 27 24   GLUCOSE 104* 94 182*  BUN 16 12 16   CREATININE 0.98 0.86 1.04  CALCIUM 9.5 9.0 8.6*  MG 2.2  --  1.5*   Liver Function Tests Recent Labs     07/29/21 0903 08/01/21 0540  AST 223* 83*  ALT 254* 103*  ALKPHOS 160* 153*  BILITOT 1.0 0.8  PROT 9.2* 7.9  ALBUMIN 2.8* 2.5*   No results for input(s): LIPASE, AMYLASE in the last 72 hours. Cardiac Enzymes No results for input(s): CKTOTAL, CKMB, CKMBINDEX, TROPONINI in the last 72 hours.  BNP: BNP (last 3 results) Recent Labs    07/17/21 1320 07/22/21 0248 07/24/21 1433  BNP 1,686.0* 2,708.0* 3,184.1*    ProBNP (last 3 results) No results for input(s): PROBNP in the last 8760 hours.   D-Dimer No results for input(s): DDIMER in the last 72 hours. Hemoglobin A1C No results for input(s): HGBA1C in the last 72 hours.  Fasting Lipid Panel No results for input(s): CHOL, HDL, LDLCALC, TRIG, CHOLHDL, LDLDIRECT in the last 72 hours. Thyroid Function Tests No results for input(s): TSH, T4TOTAL, T3FREE, THYROIDAB in the last 72 hours.  Invalid input(s): FREET3  Other results:   Imaging    [place] EKG SITE RITE  Result Date: 07/31/2021 If Site Rite image not attached, placement could not be confirmed due to current cardiac rhythm.    Medications:     Scheduled Medications:  amiodarone  200 mg Oral BID   Chlorhexidine Gluconate Cloth  6 each Topical  Daily   dapagliflozin propanediol  10 mg Oral Daily   digoxin  0.125 mg Oral Daily   docusate sodium  100 mg Oral BID   Gerhardt's butt cream  1 application Topical TID   multivitamin with minerals  1 tablet Oral Daily   pantoprazole  40 mg Oral Daily   polyethylene glycol  17 g Oral Daily   potassium chloride  40 mEq Oral Once   sodium chloride flush  10-40 mL Intracatheter Q12H   sodium chloride flush  10-40 mL Intracatheter Q12H   spironolactone  12.5 mg Oral Daily   Warfarin - Pharmacist Dosing Inpatient   Does not apply q1600    Infusions:  sodium chloride Stopped (07/26/21 0532)   magnesium sulfate bolus IVPB      PRN Medications: sodium chloride, docusate sodium, Gerhardt's butt cream, polyethylene  glycol, sodium chloride flush, sodium chloride flush    Patient Profile   36 y/o male with previous MV repair in 2019 due to torn chord with subsequent recurrent MR, chronic systolic HF due to biventricular HF with EF 30-35% in 3/22, PAF and recent hospitalization 12/22 for a/c CHF w/ low output in setting of severe MR, requiring stabilization w/ milrinone and transfer to [facility] for high risk mechanical Mitral valve replacement on 12/26. Now readmitted to [facility] for a/c CHF/shock and acute hypoxic respiratory failure requiring intubation.      Assessment/Plan  1. Acute on Chronic Biventricular Heart Failure>>Low Output Heart Failure   - BiV failure likely primarily valvular in nature in setting of severe MR - TEE12/21 EF 40 to 45% with severe MR with multiple jets including a possible paravalvular leak. Miderate RV dysfunction severe TR - [facility] 3/22 showed normal coronaries, NICM EF 30-35%, severe MR w/ prominent v-waves in PCWP tracing, mild pulmonary venous HTN with normal CO (Fick cardiac output/index = 5.3/2.5). - Referred for re-do MVR 4/22 but did not follow-up w/ Dr. Roxy Manns - Admit 12/22 for shock. Echo EF 20-25%, mild LV dilation, D-shaped septum, moderately decreased RV systolic function with mild RV enlargement, s/p MV repair with mean gradient 15 mmHg and severe MR, mild TR, dilated IVC - Transferred to [facility] and underwent mechanical MVR on 12/26 - Admitted w/ a/c CHF/shock.  Suspect acute decompensation due to recurrent Afib w/ RVR - Echo now shows LVEF <20%, RV severely reduced. Mechanical MV ok, mGradient 3 mmHg - Co-ox 68% off milrinone - Volume status ok, CVP 5-6 - Continue Spiro 12.5 mg daily  - Continue digoxin 0.125  - Continue Farxiga 10 mg daily  - Losartan previously stopped d/t hypotension. Will try to add back at 12.5 mg daily - CR consult - Discussed with [facility] team. Depending on his progress, may need to consider VAD/transplant down the road if candidate. Will need to  follow closely.    2. Severe Mitral Valve Regurgitation s/p Mitral Valve Replacement  - previous MV repair in 2019 due to torn chord with subsequent recurrent MR - s/p mechanical MVR at [facility] 06/10/21 - repeat 2D echo w/ stable prothesis, mGradient 3 mmHg  - INR 9.6 on admit. Coumadin held initially. Now back on coumadin + heparin bridge. INR 2.6 today. Now off heparin - Warfarin dosing per PharmD.    3. Acute Hypoxic Respiratory Failure, S/p Intubation  - 2/2 acute pulmonary edema - extubated 2/9 - improved with diuresis.  - need to ambulate   4. Atrial Fibrillation w/ RVR  - in setting of long standing MV disease, echo w/ severe  LAE,  LA diam: 5.90 cm - Maintaining SR  - Continue PO amio 200 mg bid   - Warfarin management as above   5. AKI  - baseline SCr ~1.0 - 1.89 on admit. Now 1.04 - suspect cardiorenal, improved w/ inotropes  - avoid hypotension - follow BMP    6. Sacral Wounds/ Pyoderma gangrenosum - Multiple old and new pustules some bleeding. Open areas on sacrum (see images in CCM note)  - previously followed at [facility]   7. Hypokalemia/hypomagnesemia - K 3.5, Mag 1.5 - Supp today  8. Shock liver - LFTs improving - Follow  9. Upper GI bleed - Dark OGT secretions.  - Gastroccult +  - Continue PPI - Hgb table at 10.3  10. Microcytic anemia - iron stores low - received feraheme 2/13  11. Hyponatremia - Tolvaptan given 2/13 - 132 today - Fluid restrict - monitor   Does not need SNF for rehab at discharge. Improving. No OT f/u recommended. PT suggesting OP Cardiac Rehab.  Length of Stay: 8  FINCH, LINDSAY N, PA-C  08/01/2021, 8:25 AM  Advanced Heart Failure Team Pager [phone] (M-F; 7a - 5p)  Please contact [facility] for night-coverage after hours (5p -7a ) and weekends on [email or web address]   Patient seen and examined with the above-signed Advanced Practice Provider and/or Housestaff. I personally reviewed laboratory data, imaging studies and  relevant notes. I independently examined the patient and formulated the important aspects of the plan. I have edited the note to reflect any of my changes or salient points. I have personally discussed the plan with the patient and/or family.  Co-ox stable off milrinone. CVP ok. Maintaining NSR on amio. Able to walk with PT. INR 2.6 - no bleeding  General: Sitting up in bed. No resp difficulty HEENT: normal Neck: supple. no JVD. Carotids 2+ bilat; no bruits. No lymphadenopathy or thryomegaly appreciated. Cor: PMI nondisplaced. Regular rate & rhythm. Mechanical s1 Lungs: clear Abdomen: soft, nontender, nondistended. No hepatosplenomegaly. No bruits or masses. Good bowel sounds. Extremities: no cyanosis, clubbing, rash, edema Neuro: alert & orientedx3, cranial nerves grossly intact. moves all 4 extremities w/o difficulty. Affect pleasant  Co-ox stable off milrinone,. Maintaining NSR. Tolerating GDMT. Will try to add back low-dose losartan. Continue to ambulate. Warfarin dosing d/w PharmD.  Glori Bickers, MD  10:22 PM

## 2021-08-01 NOTE — Progress Notes (Signed)
Patient refused BIPAP for the night.  

## 2021-08-01 NOTE — Plan of Care (Signed)

## 2021-08-01 NOTE — Progress Notes (Signed)
Occupational Therapy Treatment Patient Details Name: Donald [name] MRN: [id number] DOB: [birth year] Today's Date: 08/01/2021   History of present illness 35 y.o. male who presented to [facility] 2/7 for SOB, chest pain, palpitations, and AMS. Pt intubated and transferred to [facility] for ICU care and heart failure service consultation. Extubated 2/9. Pt admitted to [facility] for a/c CHF/shock and acute hypoxic respiratory failure. Of note, s/p mechanical MVR in December at [facility]. PMH: anemia, autoimmune disorder, CHF, DVT, dysrhythmia, mitral regurgitation, mitral stenosis, MI, PAF, pyoderma gangrenosa, seonegative spondylitis, tricuspid regurgitation   OT comments  Pt demonstrating improving activity tolerance. Pt performing grooming at sink at Mod I level with increased time due to fatigue and decreased endurance. Pt performing functional mobility in hallway with rollator for rest breaks if needed; however, pt not requiring seated rest breaks. Continue to recommend dc to home once medically stable per physician. Will continue to follow acutely as admitted.    Recommendations for follow up therapy are one component of a multi-disciplinary discharge planning process, led by the attending physician.  Recommendations may be updated based on patient status, additional functional criteria and insurance authorization.    Follow Up Recommendations  No OT follow up    Assistance Recommended at Discharge Intermittent Supervision/Assistance  Patient can return home with the following  A little help with walking and/or transfers;A little help with bathing/dressing/bathroom   Equipment Recommendations  None recommended by OT    Recommendations for Other Services PT consult    Precautions / Restrictions Precautions Precautions: Fall Restrictions Weight Bearing Restrictions: No Other Position/Activity Restrictions: s/p MVR with sternotomy December 2022       Mobility Bed Mobility Overal bed mobility: Modified  Independent             General bed mobility comments: increased time    Transfers Overall transfer level: Modified independent Equipment used: None Transfers: Sit to/from Stand             General transfer comment: Mod I for increased     Balance Overall balance assessment: Needs assistance Sitting-balance support: No upper extremity supported, Feet supported Sitting balance-Leahy Scale: Good     Standing balance support: No upper extremity supported, Bilateral upper extremity supported, Reliant on assistive device for balance, During functional activity Standing balance-Leahy Scale: Good                             ADL either performed or assessed with clinical judgement   ADL Overall ADL's : Modified independent     Grooming: Oral care;Wash/dry face;Standing;Modified independent               Lower Body Dressing: Sit to/from stand;Modified independent Lower Body Dressing Details (indicate cue type and reason): Pt donning socks and shoes while sitting at EOB using figure four method.             Functional mobility during ADLs: Rollator (4 wheels);Modified independent (with and without rollator) General ADL Comments: Performing at Mod I level with increased time. Demonstrating increased activity tolerance, strength, and balance.    Extremity/Trunk Assessment Upper Extremity Assessment Upper Extremity Assessment: Overall WFL for tasks assessed   Lower Extremity Assessment Lower Extremity Assessment: Defer to PT evaluation        Vision       Perception     Praxis      Cognition Arousal/Alertness: Awake/alert Behavior During Therapy: WFL for tasks assessed/performed Overall Cognitive Status: Within  Functional Limits for tasks assessed                                          Exercises      Shoulder Instructions       General Comments SpO2 90s on RA. HR 80s.    Pertinent Vitals/ Pain       Pain  Assessment Pain Assessment: No/denies pain  Home Living                                          Prior Functioning/Environment              Frequency  Min 2X/week        Progress Toward Goals  OT Goals(current goals can now be found in the care plan section)  Progress towards OT goals: Progressing toward goals  Acute Rehab OT Goals OT Goal Formulation: With patient Time For Goal Achievement: 08/12/21 Potential to Achieve Goals: Good ADL Goals Pt Will Perform Grooming: with modified independence;standing Pt Will Perform Lower Body Dressing: with modified independence;sit to/from stand Pt Will Transfer to Toilet: with modified independence;ambulating;regular height toilet Pt Will Perform Tub/Shower Transfer: Shower transfer;with modified independence;shower seat;ambulating  Plan Discharge plan remains appropriate    Co-evaluation                 AM-PAC OT "6 Clicks" Daily Activity     Outcome Measure   Help from another person eating meals?: None Help from another person taking care of personal grooming?: A Little Help from another person toileting, which includes using toliet, bedpan, or urinal?: A Little Help from another person bathing (including washing, rinsing, drying)?: A Little Help from another person to put on and taking off regular upper body clothing?: A Little Help from another person to put on and taking off regular lower body clothing?: A Little 6 Click Score: 19    End of Session Equipment Utilized During Treatment: Rollator (4 wheels)  OT Visit Diagnosis: Other abnormalities of gait and mobility (R26.89);Unsteadiness on feet (R26.81);Muscle weakness (generalized) (M62.81)   Activity Tolerance Patient tolerated treatment well   Patient Left in chair;with call bell/phone within reach   Nurse Communication Mobility status        Time: [id number] OT Time Calculation (min): 31 min  Charges: OT General Charges $OT  Visit: 1 Visit OT Treatments $Self Care/Home Management : 8-22 mins $Therapeutic Activity: 8-22 mins  Bronwen Pendergraft MSOT, OTR/L Acute Rehab Pager: [phone] Office: [place] 08/01/2021, 10:37 AM

## 2021-08-01 NOTE — Progress Notes (Signed)
RT note. Patient bipap QHS, Patient currently on [facility] sat 99%. Will place patient on bipap if needed later. RT will continue to monitor.

## 2021-08-01 NOTE — Plan of Care (Signed)

## 2021-08-01 NOTE — Progress Notes (Addendum)
Physical Therapy Treatment & Discharge Patient Details Name: Donald [name] MRN: [id number] DOB: 1985-12-02 Today's Date: 08/01/2021   History of Present Illness 36 y.o. male who presented to [facility] 2/7 for SOB, chest pain, palpitations, and AMS. Pt intubated and transferred to [facility] for ICU care and heart failure service consultation. Extubated 2/9. Pt admitted to [facility] for a/c CHF/shock and acute hypoxic respiratory failure. Of note, s/p mechanical MVR in December at [facility]. PMH: anemia, autoimmune disorder, CHF, DVT, dysrhythmia, mitral regurgitation, mitral stenosis, MI, PAF, pyoderma gangrenosa, seonegative spondylitis, tricuspid regurgitation    PT Comments    Pt is making great progress with mobility, meeting and exceeding all of his PT goals. Pt was able to perform all functional mobility, including gait without UE support and stairs with 1 handrail for support, without assistance. He does display some mild balance deficits, indicated by his DGI score of 18 this date. He was able to recover x1 LOB without assistance. However, he reports this is baseline due to chronic knee deficits which he plans to address with his PCP at d/c. All education completed and questions answered. As pt is back to baseline functionally, skilled PT will sign off with mobility specialists consulted to add pt to their caseload for further mobility/endurance training.   Recommendations for follow up therapy are one component of a multi-disciplinary discharge planning process, led by the attending physician.  Recommendations may be updated based on patient status, additional functional criteria and insurance authorization.  Follow Up Recommendations  Other (comment) (OP cardiac rehab)     Assistance Recommended at Discharge Intermittent Supervision/Assistance  Patient can return home with the following A little help with walking and/or transfers;A little help with bathing/dressing/bathroom;Assist for transportation;Help  with stairs or ramp for entrance   Equipment Recommendations  BSC/3in1    Recommendations for Other Services       Precautions / Restrictions Precautions Precautions: Fall (low risk) Restrictions Weight Bearing Restrictions: No Other Position/Activity Restrictions: s/p MVR with sternotomy December 2022     Mobility  Bed Mobility               General bed mobility comments: Pt up in chair upon arrival.    Transfers Overall transfer level: Modified independent   Transfers: Sit to/from Stand Sit to Stand: Modified independent (Device/Increase time)           General transfer comment: Pt comes to stand from recliner with increased time and wide BOS, reported he assumed a wide stance due to some stiffness in legs.    Ambulation/Gait Ambulation/Gait assistance: Supervision Gait Distance (Feet): 260 Feet Assistive device: None Gait Pattern/deviations: Step-through pattern, Decreased stride length, Wide base of support Gait velocity: decreased Gait velocity interpretation: 1.31 - 2.62 ft/sec, indicative of limited community ambulator   General Gait Details: Slightly wide BOS. Fairly steady gait, but x1 LOB to his R in which he was able to recover without physical assistance. Reports some mild instability at baseline due to chronic knee issues which he reports he will get addressed after he is discharged. Slowed gait with changes in head position.   Stairs Stairs: Yes Stairs assistance: Min guard Stair Management: One rail Left, One rail Right, Step to pattern, Forwards Number of Stairs: 3 General stair comments: Ascends with L rail and step-to pattern, descends with R rail and step-to pattern. No LOB, min guard for safety. Pt reports he does 2 feet to a stair at baseline and always uses rails.   Wheelchair Mobility  Modified Rankin (Stroke Patients Only)       Balance Overall balance assessment: Needs assistance Sitting-balance support: No upper  extremity supported, Feet supported Sitting balance-Leahy Scale: Good     Standing balance support: No upper extremity supported, During functional activity Standing balance-Leahy Scale: Fair Standing balance comment: Mild instability noted with dynamic gait challenges, but able to recover x1 LOB without assistance                 Standardized Balance Assessment Standardized Balance Assessment : Dynamic Gait Index   Dynamic Gait Index Level Surface: Mild Impairment Change in Gait Speed: Normal Gait with Horizontal Head Turns: Mild Impairment Gait with Vertical Head Turns: Mild Impairment Gait and Pivot Turn: Normal Step Over Obstacle: Mild Impairment Step Around Obstacles: Normal Steps: Moderate Impairment Total Score: 18      Cognition Arousal/Alertness: Awake/alert Behavior During Therapy: WFL for tasks assessed/performed Overall Cognitive Status: Within Functional Limits for tasks assessed                                 General Comments: Follows commands appropriately        Exercises      General Comments        Pertinent Vitals/Pain Pain Assessment Pain Assessment: No/denies pain    Home Living                          Prior Function            PT Goals (current goals can now be found in the care plan section) Acute Rehab PT Goals Patient Stated Goal: to get his knees looked at by MD once he discharges PT Goal Formulation: All assessment and education complete, DC therapy Time For Goal Achievement: 08/09/21 Potential to Achieve Goals: Good Progress towards PT goals: Goals met/education completed, patient discharged from PT    Frequency    Min 3X/week      PT Plan Equipment recommendations need to be updated    Co-evaluation              AM-PAC PT "6 Clicks" Mobility   Outcome Measure  Help needed turning from your back to your side while in a flat bed without using bedrails?: None Help needed moving  from lying on your back to sitting on the side of a flat bed without using bedrails?: None Help needed moving to and from a bed to a chair (including a wheelchair)?: None Help needed standing up from a chair using your arms (e.g., wheelchair or bedside chair)?: None Help needed to walk in hospital room?: A Little Help needed climbing 3-5 steps with a railing? : A Little 6 Click Score: 22    End of Session   Activity Tolerance: Patient tolerated treatment well Patient left: in chair;with call bell/phone within reach Nurse Communication: Mobility status PT Visit Diagnosis: Unsteadiness on feet (R26.81);Other abnormalities of gait and mobility (R26.89);Difficulty in walking, not elsewhere classified (R26.2)     Time: 1207-1219 PT Time Calculation (min) (ACUTE ONLY): 12 min  Charges:  $Gait Training: 8-22 mins                     Moishe Spice, PT, DPT Acute Rehabilitation Services  Pager: [phone] Office: [place] 08/01/2021, 2:53 PM

## 2021-08-02 ENCOUNTER — Other Ambulatory Visit (HOSPITAL_COMMUNITY): Payer: Self-pay

## 2021-08-02 LAB — CBC
HCT: 30.1 % — ABNORMAL LOW (ref 39.0–52.0)
Hemoglobin: 9.6 g/dL — ABNORMAL LOW (ref 13.0–17.0)
MCH: 24.7 pg — ABNORMAL LOW (ref 26.0–34.0)
MCHC: 31.9 g/dL (ref 30.0–36.0)
MCV: 77.6 fL — ABNORMAL LOW (ref 80.0–100.0)
Platelets: 257 10*3/uL (ref 150–400)
RBC: 3.88 MIL/uL — ABNORMAL LOW (ref 4.22–5.81)
RDW: 16.4 % — ABNORMAL HIGH (ref 11.5–15.5)
WBC: 8.1 10*3/uL (ref 4.0–10.5)
nRBC: 0 % (ref 0.0–0.2)

## 2021-08-02 LAB — MAGNESIUM: Magnesium: 1.8 mg/dL (ref 1.7–2.4)

## 2021-08-02 LAB — COOXEMETRY PANEL
Carboxyhemoglobin: 2.4 % — ABNORMAL HIGH (ref 0.5–1.5)
Methemoglobin: 0.5 % (ref 0.0–1.5)
O2 Saturation: 74.2 %
Total hemoglobin: 9.9 g/dL — ABNORMAL LOW (ref 12.0–16.0)

## 2021-08-02 LAB — BASIC METABOLIC PANEL
Anion gap: 7 (ref 5–15)
BUN: 14 mg/dL (ref 6–20)
CO2: 24 mmol/L (ref 22–32)
Calcium: 8.6 mg/dL — ABNORMAL LOW (ref 8.9–10.3)
Chloride: 99 mmol/L (ref 98–111)
Creatinine, Ser: 0.94 mg/dL (ref 0.61–1.24)
GFR, Estimated: >60 mL/min (ref >60)
Glucose, Bld: 131 mg/dL — ABNORMAL HIGH (ref 70–99)
Potassium: 3.6 mmol/L (ref 3.5–5.1)
Sodium: 130 mmol/L — ABNORMAL LOW (ref 135–145)

## 2021-08-02 LAB — GLUCOSE, CAPILLARY
Glucose-Capillary: 92 mg/dL (ref 70–99)
Glucose-Capillary: 98 mg/dL (ref 70–99)
Glucose-Capillary: 95 mg/dL (ref 70–99)
Glucose-Capillary: 102 mg/dL — ABNORMAL HIGH (ref 70–99)

## 2021-08-02 LAB — PROTIME-INR
INR: 2.7 — ABNORMAL HIGH (ref 0.8–1.2)
Prothrombin Time: 28.6 seconds — ABNORMAL HIGH (ref 11.4–15.2)

## 2021-08-02 MED ORDER — POTASSIUM CHLORIDE CRYS ER 20 MEQ PO TBCR
40.0000 meq | EXTENDED_RELEASE_TABLET | Freq: Every day | ORAL | 1 refills | Status: DC
  Filled 2021-08-02 – 2021-08-20 (×2): qty 60, 30d supply, fill #0

## 2021-08-02 MED ORDER — LOSARTAN POTASSIUM 25 MG PO TABS
12.5000 mg | ORAL_TABLET | Freq: Every day | ORAL | 1 refills | Status: DC
  Filled 2021-08-02: qty 16, 32d supply, fill #0

## 2021-08-02 MED ORDER — SPIRONOLACTONE 25 MG PO TABS
25.0000 mg | ORAL_TABLET | Freq: Every day | ORAL | 1 refills | Status: DC
  Filled 2021-08-02 – 2021-08-20 (×2): qty 30, 30d supply, fill #0

## 2021-08-02 MED ORDER — POTASSIUM CHLORIDE CRYS ER 20 MEQ PO TBCR
40.0000 meq | EXTENDED_RELEASE_TABLET | Freq: Once | ORAL | Status: AC
  Administered 2021-08-02: 40 meq via ORAL
  Filled 2021-08-02: qty 2

## 2021-08-02 MED ORDER — WARFARIN SODIUM 3 MG PO TABS
3.0000 mg | ORAL_TABLET | Freq: Once | ORAL | Status: AC
  Administered 2021-08-02: 3 mg via ORAL
  Filled 2021-08-02: qty 1

## 2021-08-02 MED ORDER — MAGNESIUM SULFATE 2 GM/50ML IV SOLN
2.0000 g | Freq: Once | INTRAVENOUS | Status: AC
  Administered 2021-08-02: 2 g via INTRAVENOUS
  Filled 2021-08-02: qty 50

## 2021-08-02 MED ORDER — AMIODARONE HCL 200 MG PO TABS
200.0000 mg | ORAL_TABLET | Freq: Two times a day (BID) | ORAL | 1 refills | Status: DC
Start: 2021-08-02 — End: 2021-08-08
  Filled 2021-08-02: qty 60, 30d supply, fill #0

## 2021-08-02 MED ORDER — DIGOXIN 125 MCG PO TABS
0.1250 mg | ORAL_TABLET | Freq: Every day | ORAL | 1 refills | Status: DC
  Filled 2021-08-02: qty 30, 30d supply, fill #0

## 2021-08-02 MED ORDER — DAPAGLIFLOZIN PROPANEDIOL 10 MG PO TABS
10.0000 mg | ORAL_TABLET | Freq: Every day | ORAL | 1 refills | Status: DC
  Filled 2021-08-02: qty 30, 30d supply, fill #0
  Filled 2021-08-20: qty 30, 30d supply, fill #1
  Filled 2021-08-20: qty 30, 30d supply, fill #0

## 2021-08-02 MED ORDER — FUROSEMIDE 40 MG PO TABS
40.0000 mg | ORAL_TABLET | Freq: Every day | ORAL | 1 refills | Status: DC
  Filled 2021-08-02 – 2021-08-20 (×2): qty 30, 30d supply, fill #0
  Filled 2021-08-20: qty 30, 30d supply, fill #1

## 2021-08-02 MED ORDER — SPIRONOLACTONE 25 MG PO TABS
25.0000 mg | ORAL_TABLET | Freq: Every day | ORAL | Status: DC
  Administered 2021-08-02 – 2021-08-03 (×2): 25 mg via ORAL
  Filled 2021-08-02 (×2): qty 1

## 2021-08-02 MED ORDER — FUROSEMIDE 40 MG PO TABS
40.0000 mg | ORAL_TABLET | Freq: Every day | ORAL | Status: DC
  Administered 2021-08-02 – 2021-08-03 (×2): 40 mg via ORAL
  Filled 2021-08-02 (×2): qty 1

## 2021-08-02 NOTE — Progress Notes (Addendum)
Advanced Heart Failure Rounding Note  PCP-Cardiologist: Dina Rich, MD   Subjective:   Admitted with A/C HFrEF/shock. Intubated. Transferred to [facility] from [facility] for shock/HF.   2/8 Bld CX- NGTD 2/9 Extubated 2/14 Milrinone stopped  CO-OX stable off milrinone. 74%   Denies SOB. Wants to make sure he can keep the fluid off.   Objective:   Weight Range: 89.1 kg Body mass index is 29.01 kg/m.   Vital Signs:   Temp:  [97.9 F (36.6 C)-99 F (37.2 C)] 98.2 F (36.8 C) (02/17 0349) Pulse Rate:  [78-88] 82 (02/17 0349) Resp:  [18-23] 18 (02/17 0349) BP: (104-115)/(69-83) 104/77 (02/17 0349) SpO2:  [96 %-100 %] 96 % (02/17 0349) Weight:  [89.1 kg] 89.1 kg (02/17 0557) Last BM Date : 07/30/21  Weight change: Filed Weights   07/30/21 0800 07/31/21 0500 08/02/21 0557  Weight: 86 kg 86.6 kg 89.1 kg    Intake/Output:   Intake/Output Summary (Last 24 hours) at 08/02/2021 0701 Last data filed at 08/02/2021 0350 Gross per 24 hour  Intake 480 ml  Output 2025 ml  Net -1545 ml      Physical Exam  CVP 7-8 General:  Well appearing. No resp difficulty HEENT: normal Neck: supple. no JVD. Carotids 2+ bilat; no bruits. No lymphadenopathy or thryomegaly appreciated. Cor: PMI nondisplaced. Regular rate & rhythm. No rubs, gallops . Mechanical S1 Lungs: clear Abdomen: soft, nontender, nondistended. No hepatosplenomegaly. No bruits or masses. Good bowel sounds. Extremities: no cyanosis, clubbing, rash, edema. RUE PICC  Neuro: alert & orientedx3, cranial nerves grossly intact. moves all 4 extremities w/o difficulty. Affect pleasant   Telemetry   SR 80-90s    Labs    CBC Recent Labs    08/01/21 0540 08/02/21 0515  WBC 9.4 8.1  HGB 10.3* 9.6*  HCT 32.0* 30.1*  MCV 78.2* 77.6*  PLT 267 257   Basic Metabolic Panel Recent Labs    48/18/56 0540 08/02/21 0515  NA 132* 130*  K 3.5 3.6  CL 101 99  CO2 24 24  GLUCOSE 182* 131*  BUN 16 14  CREATININE 1.04 0.94   CALCIUM 8.6* 8.6*  MG 1.5* 1.8   Liver Function Tests Recent Labs    08/01/21 0540  AST 83*  ALT 103*  ALKPHOS 153*  BILITOT 0.8  PROT 7.9  ALBUMIN 2.5*   No results for input(s): LIPASE, AMYLASE in the last 72 hours. Cardiac Enzymes No results for input(s): CKTOTAL, CKMB, CKMBINDEX, TROPONINI in the last 72 hours.  BNP: BNP (last 3 results) Recent Labs    07/17/21 1320 07/22/21 0248 07/24/21 1433  BNP 1,686.0* 2,708.0* 3,184.1*    ProBNP (last 3 results) No results for input(s): PROBNP in the last 8760 hours.   D-Dimer No results for input(s): DDIMER in the last 72 hours. Hemoglobin A1C No results for input(s): HGBA1C in the last 72 hours.  Fasting Lipid Panel No results for input(s): CHOL, HDL, LDLCALC, TRIG, CHOLHDL, LDLDIRECT in the last 72 hours. Thyroid Function Tests No results for input(s): TSH, T4TOTAL, T3FREE, THYROIDAB in the last 72 hours.  Invalid input(s): FREET3  Other results:   Imaging    No results found.   Medications:     Scheduled Medications:  amiodarone  200 mg Oral BID   Chlorhexidine Gluconate Cloth  6 each Topical Daily   dapagliflozin propanediol  10 mg Oral Daily   digoxin  0.125 mg Oral Daily   docusate sodium  100 mg Oral BID  Gerhardt's butt cream  1 application Topical TID   losartan  12.5 mg Oral Daily   multivitamin with minerals  1 tablet Oral Daily   pantoprazole  40 mg Oral Daily   polyethylene glycol  17 g Oral Daily   sodium chloride flush  10-40 mL Intracatheter Q12H   sodium chloride flush  10-40 mL Intracatheter Q12H   spironolactone  12.5 mg Oral Daily   Warfarin - Pharmacist Dosing Inpatient   Does not apply q1600    Infusions:  sodium chloride Stopped (07/26/21 0532)    PRN Medications: sodium chloride, docusate sodium, Gerhardt's butt cream, polyethylene glycol, sodium chloride flush, sodium chloride flush    Patient Profile   36 y/o male with previous MV repair in 2019 due to torn  chord with subsequent recurrent MR, chronic systolic HF due to biventricular HF with EF 30-35% in 3/22, PAF and recent hospitalization 12/22 for a/c CHF w/ low output in setting of severe MR, requiring stabilization w/ milrinone and transfer to [facility] for high risk mechanical Mitral valve replacement on 12/26. Now readmitted to [facility] for a/c CHF/shock and acute hypoxic respiratory failure requiring intubation.      Assessment/Plan  1. Acute on Chronic Biventricular Heart Failure>>Low Output Heart Failure   - BiV failure likely primarily valvular in nature in setting of severe MR - TEE12/21 EF 40 to 45% with severe MR with multiple jets including a possible paravalvular leak. Miderate RV dysfunction severe TR - [facility] 3/22 showed normal coronaries, NICM EF 30-35%, severe MR w/ prominent v-waves in PCWP tracing, mild pulmonary venous HTN with normal CO (Fick cardiac output/index = 5.3/2.5). - Referred for re-do MVR 4/22 but did not follow-up w/ Dr. Cornelius Moras - Admit 12/22 for shock. Echo EF 20-25%, mild LV dilation, D-shaped septum, moderately decreased RV systolic function with mild RV enlargement, s/p MV repair with mean gradient 15 mmHg and severe MR, mild TR, dilated IVC - Transferred to [facility] and underwent mechanical MVR on 12/26 - Admitted w/ a/c CHF/shock.  Suspect acute decompensation due to recurrent Afib w/ RVR - Echo now shows LVEF <20%, RV severely reduced. Mechanical MV ok, mGradient 3 mmHg - Co-ox stable off  milrinone - Volume status ok. CVP 7-8. Start lasix 40 mg daily  - Increase spiro to 25 mg daily.   - Continue digoxin 0.125  - Continue Farxiga 10 mg daily  - Continue losartan 12.5 mg daily.  - CR consult - Discussed with [facility] team. Depending on his progress, may need to consider VAD/transplant down the road if candidate. Will need to follow closely.    2. Severe Mitral Valve Regurgitation s/p Mitral Valve Replacement  - previous MV repair in 2019 due to torn chord with subsequent  recurrent MR - s/p mechanical MVR at [facility] 06/10/21 - repeat 2D echo w/ stable prothesis, mGradient 3 mmHg  - INR 9.6 on admit. Coumadin held initially. Now back on coumadin + heparin bridge. INR 2.7 today. Now off heparin - Warfarin dosing per PharmD.    3. Acute Hypoxic Respiratory Failure, S/p Intubation  - 2/2 acute pulmonary edema - extubated 2/9 - improved with diuresis.  - need to ambulate   4. Atrial Fibrillation w/ RVR  - in setting of long standing MV disease, echo w/ severe LAE,  LA diam: 5.90 cm - Maintaining SR  - Continue PO amio 200 mg bid   - Warfarin management as above   5. AKI  - baseline SCr ~1.0 - 1.89 on admit.  Normalized.  - suspect cardiorenal, improved w/ inotropes    6. Sacral Wounds/ Pyoderma gangrenosum - Multiple old and new pustules some bleeding. Open areas on sacrum (see images in CCM note)  - previously followed at [facility]   7. Hypokalemia/hypomagnesemia - K 3.6, Mag 1.8 - Give 2 grams Mag   8. Shock liver - LFTs improving - Follow  9. Upper GI bleed - Dark OGT secretions.  - Gastroccult +  - Continue PPI - Hgb trending down 11>10.3>9.6   10. Microcytic anemia - iron stores low - received feraheme 2/13  11. Hyponatremia - Tolvaptan given 2/13 - 130 today - Fluid restrict - monitor   Does not need SNF for rehab at discharge. Improving. No OT f/u recommended. PT suggesting OP Cardiac Rehab.  Length of Stay: 9  Amy Clegg, NP  08/02/2021, 7:01 AM  Advanced Heart Failure Team Pager [phone] (M-F; 7a - 5p)  Please contact [facility] for night-coverage after hours (5p -7a ) and weekends on [email or web address]  Patient seen and examined with the above-signed Advanced Practice Provider and/or Housestaff. I personally reviewed laboratory data, imaging studies and relevant notes. I independently examined the patient and formulated the important aspects of the plan. I have edited the note to reflect any of my changes or salient points. I have  personally discussed the plan with the patient and/or family.  Continues to feel well. Co-ox stable off milrinone. CVP 7-8   Remains in NSR on po amio. INR 2.7 today   General:  Well appearing. No resp difficulty HEENT: normal Neck: supple. no JVD. Carotids 2+ bilat; no bruits. No lymphadenopathy or thryomegaly appreciated. Cor: PMI nondisplaced. Regular rate & rhythm. Mechanical s1 Lungs: clear Abdomen: obese soft, nontender, nondistended. No hepatosplenomegaly. No bruits or masses. Good bowel sounds. Extremities: no cyanosis, clubbing, rash, edema Neuro: alert & orientedx3, cranial nerves grossly intact. moves all 4 extremities w/o difficulty. Affect pleasant  Much improved. Absolutely needs to stay in NSR. On decent GDMT. Agree with lasix 40 daily. Possibly home tomorrow with close f/u in HF Clinic. Send home on amio 200 bid.   Arvilla Meres, MD  8:10 AM

## 2021-08-02 NOTE — Progress Notes (Signed)
Patient refuses BiPAP.

## 2021-08-02 NOTE — Plan of Care (Signed)

## 2021-08-02 NOTE — Discharge Summary (Incomplete)
Advanced Heart Failure Team  Discharge Summary   Patient ID: Donald [name] MRN: [id number], DOB/AGE: June 08, 1986 36 y.o. Admit date: 07/24/2021 D/C date:     08/02/2021   Primary Discharge Diagnoses:  A/C Biventricular HF >> low output heart failure Severe MVR S/P MV Replacement  Acute Hypoxemic Respiratory Failure  A fib RVR AKI  Sacrum Wounds/Pyoderma Gangrenosum  Hypokalemia/Hypomagnesemia  Shock Liver Upper GI Bleed  Microcytic Anemia Hyponatremia   Hospital Course:   36 y/o male with previous MV repair in 2019 due to torn chord with subsequent recurrent MR, chronic systolic HF due to biventricular HF with EF 30-35% in 3/22, PAF and recent hospitalization 12/22 for a/c CHF w/ low output in setting of severe MR, requiring stabilization w/ milrinone and transfer to [facility] for high risk mechanical Mitral valve replacement on 12/26.   Readmitted to [facility] for a/c CHF/shock /Afib RVR and acute hypoxic respiratory failure requiring intubation.  Lactic acid 5.4  and INR 9.6 on admit. Placed on empiric antibiotics. Placed on milrinone and amiodarone drip. Diuresed with IV lasix. Extubated with gradual oxygen wean.   See below for detailed problems list. He will contine to be followed closely in the HF clinic.    1. Acute on Chronic Biventricular Heart Failure>>Low Output Heart Failure   - BiV failure likely primarily valvular in nature in setting of severe MR - TEE12/21 EF 40 to 45% with severe MR with multiple jets including a possible paravalvular leak. Miderate RV dysfunction severe TR - [facility] 3/22 showed normal coronaries, NICM EF 30-35%, severe MR w/ prominent v-waves in PCWP tracing, mild pulmonary venous HTN with normal CO (Fick cardiac output/index = 5.3/2.5). - Referred for re-do MVR 4/22 but did not follow-up w/ Dr. Roxy Manns - Admit 12/22 for shock. Echo EF 20-25%, mild LV dilation, D-shaped septum, moderately decreased RV systolic function with mild RV enlargement, s/p MV repair with  mean gradient 15 mmHg and severe MR, mild TR, dilated IVC - Transferred to [facility] and underwent mechanical MVR on 12/26 - Admitted w/ a/c CHF/shock.  Suspect acute decompensation due to recurrent Afib w/ RVR - Echo now shows LVEF <20%, RV severely reduced. Mechanical MV ok, mGradient 3 mmHg Placed on milrinone and gradually weaned off.  -Diuresed with IV lasix and transitioned to lasix 40 mg daily  - Continue spiro to 25 mg daily.   - Continue digoxin 0.125  - Continue Farxiga 10 mg daily  - Continue losartan 12.5 mg daily.  - CR consult - Discussed with [facility] team. Depending on his progress, may need to consider VAD/transplant down the road if candidate. Will need to follow closely.   2. Severe Mitral Valve Regurgitation s/p Mitral Valve Replacement  - previous MV repair in 2019 due to torn chord with subsequent recurrent MR - s/p mechanical MVR at [facility] 06/10/21 - repeat 2D echo w/ stable prothesis, mGradient 3 mmHg  - INR 9.6 on admit. Coumadin held initially. Now back on coumadin + heparin bridge. INR 2.7 today. Now off heparin - Warfarin dosing per PharmD.   3. Acute Hypoxic Respiratory Failure, S/p Intubation  - 2/2 acute pulmonary edema - extubated 2/9 and weaned to room air.  - improved with diuresis.  4. Atrial Fibrillation w/ RVR  - in setting of long standing MV disease, echo w/ severe LAE,  LA diam: 5.90 cm Placed on amio drip with conversion to NSR.  - Continue PO amio 200 mg bid   - Warfarin management as above  5.  AKI  - baseline SCr ~1.0 - 1.89 on admit. Normalized.  - suspect cardiorenal, improved w/ inotropes   6. Sacral Wounds/ Pyoderma gangrenosum - Multiple old and new pustules some bleeding. Open areas on sacrum (see images in CCM note)  - previously followed at [facility]   7. Hypokalemia/hypomagnesemia - K and Mag supplements   8. Shock liver - LFTs improving 9. Upper GI bleed - Dark OGT secretions.  - Gastroccult +  - Continue PPI - CBC followed.    10.  Microcytic anemia - iron stores low - received feraheme 2/13  11. Hyponatremia - Tolvaptan given 2/13 - 130 today - Fluid restrict - monitor    Discharge Vitals: Blood pressure 97/68, pulse 77, temperature 98 F (36.7 C), temperature source Oral, resp. rate 17, height 5\' 9"  (1.753 m), weight 89.1 kg, SpO2 98 %.  Labs: Lab Results  Component Value Date   WBC 8.1 08/02/2021   HGB 9.6 (L) 08/02/2021   HCT 30.1 (L) 08/02/2021   MCV 77.6 (L) 08/02/2021   PLT 257 08/02/2021    Recent Labs  Lab 08/01/21 0540 08/02/21 0515  NA 132* 130*  K 3.5 3.6  CL 101 99  CO2 24 24  BUN 16 14  CREATININE 1.04 0.94  CALCIUM 8.6* 8.6*  PROT 7.9  --   BILITOT 0.8  --   ALKPHOS 153*  --   ALT 103*  --   AST 83*  --   GLUCOSE 182* 131*   No results found for: CHOL, HDL, LDLCALC, TRIG BNP (last 3 results) Recent Labs    07/17/21 1320 07/22/21 0248 07/24/21 1433  BNP 1,686.0* 2,708.0* 3,184.1*    ProBNP (last 3 results) No results for input(s): PROBNP in the last 8760 hours.   Diagnostic Studies/Procedures   No results found.  Discharge Medications   Allergies as of 08/02/2021   No Known Allergies   Med Rec must be completed prior to using this [facility]***       Disposition   The patient will be discharged in stable condition to home.   Follow-up Information     [facility] Follow up on 08/08/2021.   Specialty: Cardiology Why: at [place] information: [address] [id number] [place], [place] Follow up.   Why: Address: [address] [place] [address] please arrive to your appt 15 minutes early, Monday Aug 05, 2021 bring proof of income bring hospital discharge paperwork, bring ID Contact information: [place] [address] [phone]                   Duration of Discharge Encounter: Greater than  35 minutes   Signed, Keionte Swicegood NP-C  08/02/2021, 1:50 PM

## 2021-08-02 NOTE — Progress Notes (Signed)
Mobility Specialist Criteria Algorithm Info.   08/02/21 1637  Mobility  Bed Position Chair  Activity Ambulated independently in hallway  Range of Motion/Exercises Active;All extremities  Level of Assistance Independent  Assistive Device None  Distance Ambulated (ft) 400 ft  Activity Response Tolerated well  Transport method Ambulatory   Patient ambulated in hallway independently with steady gait. Tolerated ambulation well without complaint or incident. Was in recliner chair with all needs met, call bell in reach.  08/02/2021 4:37 PM  Donald [name], Reader, [place]  ZBCAF:[phone] Office: [phone]

## 2021-08-02 NOTE — Progress Notes (Addendum)
ANTICOAGULATION CONSULT NOTE  Pharmacy Consult for warfarin  Indication: mechanical MVR   Labs: Recent Labs    07/31/21 0450 08/01/21 0540 08/02/21 0515  HGB 11.0* 10.3* 9.6*  HCT 34.0* 32.0* 30.1*  PLT 284 267 257  LABPROT 24.8* 27.5* 28.6*  INR 2.2* 2.6* 2.7*  HEPARINUNFRC 0.12*  --   --   CREATININE 0.86 1.04 0.94     Estimated Creatinine Clearance: 123.5 mL/min (by C-G formula based on SCr of 0.94 mg/dL).   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 EF 35-40% by echo in 07/2018 b. EF at 45% by repeat echo in 04/2020   DVT (deep venous thrombosis) ([facility])    h/o   Dysrhythmia    Mitral regurgitation    a. 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 b. 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[facility])    arthritis   Tricuspid regurgitation     Assessment: 36 yo M present on 07/24/2021 with shortness of breath and found to have acute/chronic heart failure. Of note, recent history of mechanical MV replacement 12/22 at [facility]. On warfarin PTA with last dose 07/22/21. INR was 9.8 on admit, FFP given 2/8. PTA regimen: MWF 4mg , STThS 3mg  (total weekly 24mg ). Pharmacy consulted for warfarin management.   INR 2.7, therapeutic  Hgb trending down, now 9.6. no s/s of bleeding.   INR goal: 2.5-3.5 Monitor platelets by anticoagulation protocol: Yes   Plan:  Warfarin 3mg  PO x1 tonight Daily INR Plans for discharge tomorrow and anticipate will go back on home regimen  Thank you for allowing pharmacy to participate in this patient's care.  Levonne Spiller, PharmD PGY1 Acute Care Resident  08/02/2021,7:01  AM

## 2021-08-02 NOTE — Plan of Care (Signed)
  Problem: Education: Goal: Knowledge of General Education information will improve Description: Including pain rating scale, medication(s)/side effects and non-pharmacologic comfort measures Outcome: Progressing   Problem: Health Behavior/Discharge Planning: Goal: Ability to manage health-related needs will improve Outcome: Progressing   Problem: Clinical Measurements: Goal: Will remain free from infection Outcome: Progressing   Problem: Nutrition: Goal: Adequate nutrition will be maintained Outcome: Progressing   Problem: Coping: Goal: Level of anxiety will decrease Outcome: Progressing   Problem: Pain Managment: Goal: General experience of comfort will improve Outcome: Progressing   

## 2021-08-02 NOTE — TOC CM/SW Note (Signed)
HF TOC CM arranged appt for PCP at [facility], on 08/05/2021 at 1:00 pm. Instructions included on his discharge instructions. Does not have insurance, will use MATCH/HF funds for medications.Message to attending to have meds sent up from [facility] pharmacy. Isidoro Donning RN3 CCM, Heart Failure TOC CM [phone]

## 2021-08-03 DIAGNOSIS — K922 Gastrointestinal hemorrhage, unspecified: Secondary | ICD-10-CM

## 2021-08-03 DIAGNOSIS — L88 Pyoderma gangrenosum: Secondary | ICD-10-CM

## 2021-08-03 DIAGNOSIS — I4891 Unspecified atrial fibrillation: Secondary | ICD-10-CM

## 2021-08-03 DIAGNOSIS — E871 Hypo-osmolality and hyponatremia: Secondary | ICD-10-CM

## 2021-08-03 DIAGNOSIS — D509 Iron deficiency anemia, unspecified: Secondary | ICD-10-CM

## 2021-08-03 DIAGNOSIS — K7200 Acute and subacute hepatic failure without coma: Secondary | ICD-10-CM

## 2021-08-03 DIAGNOSIS — S31000A Unspecified open wound of lower back and pelvis without penetration into retroperitoneum, initial encounter: Secondary | ICD-10-CM

## 2021-08-03 LAB — CBC
HCT: 30.1 % — ABNORMAL LOW (ref 39.0–52.0)
Hemoglobin: 9.5 g/dL — ABNORMAL LOW (ref 13.0–17.0)
MCH: 24.5 pg — ABNORMAL LOW (ref 26.0–34.0)
MCHC: 31.6 g/dL (ref 30.0–36.0)
MCV: 77.6 fL — ABNORMAL LOW (ref 80.0–100.0)
Platelets: 252 10*3/uL (ref 150–400)
RBC: 3.88 MIL/uL — ABNORMAL LOW (ref 4.22–5.81)
RDW: 17.1 % — ABNORMAL HIGH (ref 11.5–15.5)
WBC: 8.1 10*3/uL (ref 4.0–10.5)
nRBC: 0 % (ref 0.0–0.2)

## 2021-08-03 LAB — BASIC METABOLIC PANEL
Anion gap: 7 (ref 5–15)
BUN: 15 mg/dL (ref 6–20)
CO2: 23 mmol/L (ref 22–32)
Calcium: 8.5 mg/dL — ABNORMAL LOW (ref 8.9–10.3)
Chloride: 99 mmol/L (ref 98–111)
Creatinine, Ser: 0.99 mg/dL (ref 0.61–1.24)
GFR, Estimated: >60 mL/min (ref >60)
Glucose, Bld: 131 mg/dL — ABNORMAL HIGH (ref 70–99)
Potassium: 3.9 mmol/L (ref 3.5–5.1)
Sodium: 129 mmol/L — ABNORMAL LOW (ref 135–145)

## 2021-08-03 LAB — PROTIME-INR
INR: 2.6 — ABNORMAL HIGH (ref 0.8–1.2)
Prothrombin Time: 27.6 seconds — ABNORMAL HIGH (ref 11.4–15.2)

## 2021-08-03 LAB — GLUCOSE, CAPILLARY
Glucose-Capillary: 99 mg/dL (ref 70–99)
Glucose-Capillary: 95 mg/dL (ref 70–99)

## 2021-08-03 LAB — COOXEMETRY PANEL
Carboxyhemoglobin: 1.6 % — ABNORMAL HIGH (ref 0.5–1.5)
Methemoglobin: 0.9 % (ref 0.0–1.5)
O2 Saturation: 74.3 %
Total hemoglobin: 9.6 g/dL — ABNORMAL LOW (ref 12.0–16.0)

## 2021-08-03 LAB — DIGOXIN LEVEL: Digoxin Level: 0.2 ng/mL — ABNORMAL LOW (ref 0.8–2.0)

## 2021-08-03 MED ORDER — WARFARIN SODIUM 2 MG PO TABS: ORAL_TABLET | ORAL | Status: DC

## 2021-08-03 MED ORDER — WARFARIN SODIUM 4 MG PO TABS: 4.0000 mg | ORAL_TABLET | Freq: Once | ORAL | Status: DC

## 2021-08-03 MED ORDER — POTASSIUM CHLORIDE CRYS ER 20 MEQ PO TBCR
20.0000 meq | EXTENDED_RELEASE_TABLET | Freq: Every day | ORAL | Status: DC
  Administered 2021-08-03: 20 meq via ORAL
  Filled 2021-08-03: qty 1

## 2021-08-03 MED ORDER — PANTOPRAZOLE SODIUM 40 MG PO TBEC: 40.0000 mg | DELAYED_RELEASE_TABLET | Freq: Every day | ORAL | 2 refills | Status: DC

## 2021-08-03 NOTE — Progress Notes (Signed)
ANTICOAGULATION CONSULT NOTE  Pharmacy Consult for warfarin  Indication: mechanical MVR   Labs: Recent Labs    08/01/21 0540 08/02/21 0515 08/03/21 0520  HGB 10.3* 9.6* 9.5*  HCT 32.0* 30.1* 30.1*  PLT 267 257 252  LABPROT 27.5* 28.6* 27.6*  INR 2.6* 2.7* 2.6*  CREATININE 1.04 0.94 0.99     Estimated Creatinine Clearance: 117.7 mL/min (by C-G formula based on SCr of 0.99 mg/dL).   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 EF 35-40% by echo in 07/2018 b. EF at 45% by repeat echo in 04/2020   DVT (deep venous thrombosis) ([facility])    h/o   Dysrhythmia    Mitral regurgitation    a. 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 b. 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[facility])    arthritis   Tricuspid regurgitation     Assessment: 36 yo M present on 07/24/2021 with shortness of breath and found to have acute/chronic heart failure. Of note, recent history of mechanical MV replacement 12/22 at [facility]. On warfarin PTA with last dose 07/22/21. INR was 9.8 on admit, FFP given 2/8. PTA regimen: MWF 4mg , STThS 3mg  (total weekly 24mg ). Pharmacy consulted for warfarin management.   INR 2.6, therapeutic, but lower end of range Hgb stable at 9.5. no s/s of bleeding.   INR goal: 2.5-3.5 Monitor platelets by anticoagulation protocol: Yes   Plan:  Warfarin 4mg  PO x1 tonight then resume home regimen (see below) PTA regimen: MWF 4mg , STThS 3mg  (total weekly 24mg ) Discharge today, follow-up with warfarin clinic in [place] early next week D/w cardiology PA Hinton Dyer  Thank you for allowing pharmacy to participate in this  patient's care.  Cathrine Muster, PharmD PGY2 Cardiology Pharmacy Resident Phone: [phone]  08/03/2021  9:06 AM  Please check [email or web address] for unit-specific pharmacy phone numbers.

## 2021-08-03 NOTE — Progress Notes (Signed)
Pt got discharged to home, discharge instructions provided and patient showed understanding to it, IV taken out,Telemonitor DC,pt left unit in wheelchair with all of the belongings accompanied with a family member (Father) ° °Tymothy Cass, RN °
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
## 2021-08-03 NOTE — Discharge Summary (Addendum)
Discharge Summary    Patient ID: Donald [name] MRN: [id number]; DOB: 1986-02-06  Admit date: 07/24/2021 Discharge date: 08/03/2021  PCP:  Patient, No Pcp Per (Inactive)   [place] Providers Cardiologist:  Donald Dolly, MD, Also followed by Advanced Heart Failure Team Click here to update MD or APP on Care Team, Refresh:1}     Discharge Diagnoses    Principal Problem:   Acute hypoxemic respiratory failure ([place]) Active Problems:   Acute on chronic HFrEF (heart failure with reduced ejection fraction) ([place])   Seronegative spondylitis ([place])   AKI (acute kidney injury) ([place])   Encounter for central line placement   Cardiogenic shock ([place])   Atrial fibrillation with RVR ([place])   Sacral wound   Pyoderma gangrenosum   Shock liver   Upper GI bleed   Microcytic anemia   Hyponatremia    Diagnostic Studies/Procedures    2D echo 07/24/21   1. Left ventricular ejection fraction, by estimation, is <20%. The left  ventricle has severely decreased function. The left ventricle demonstrates  global hypokinesis. The left ventricular internal cavity size was severely  dilated. Left ventricular  diastolic parameters are consistent with Grade II diastolic dysfunction  (pseudonormalization).   2. Right ventricular systolic function is severely reduced. The right  ventricular size is moderately enlarged. There is mildly elevated  pulmonary artery systolic pressure.   3. S/p mitral valve repair. MV mean gradient 3 mmHg HR 81 bpm.  Significant shadowing, MR has improved.   4. The aortic valve is grossly normal. Aortic valve regurgitation is  mild.   5. The inferior vena cava is dilated in size with <50% respiratory  variability, suggesting right atrial pressure of 15 mmHg.   Conclusion(s)/Recommendation(s): Compared to echo 05/31/2021, MV mean  gradient and MR have improved.  _____________   History of Present Illness     Donald [name] is a 36 y.o. 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   The patient has had multiple episodes of acute exacerbation of fluid overload as well as recurrent mitral regurgitation followed over the last several years. He has had several episodes of AFL with RVR, requiring DCCVs. He saw Dr. Roxy [name] in 2/22 for consideration of repeat MV surgery and subsequently referred to the [facility] for CHF evaluation/ optimization and RHC prior to potential re-do MVR. Saw Dr. Haroldine [name] for consultation and set up for [facility] 09/11/20 which showed normal coronaries, NICM EF 30-35%, severe MR w/ prominent v-waves in PCWP tracing, mild pulmonary venous HTN with normal CO (Fick cardiac output/index = 5.3/2.5). He was scheduled to f/u w/ Dr. Roxy [name] in April 2022 to further discuss surgery but he canceled the appt. Appointment was rescheduled but he cancelled again. Was also arranged to undergo dental extractions by Dr. Benson [name] but canceled as well.   He was hospitalized for a/c CHF in December 2022 with AKI, low output, lactic acidosis. Echo showed EF 20-25% with severe MR/mod MS and severe RV dysfunction. He required IV Lasix and IV milrinone. Dr Donald [name] reviewed case with [facility]  regarding MVR but he was felt to be too high-risk and they recommended transfer to [facility]. He underwent MVR w/ mechanical valve on 06/10/21 by Dr. Wannetta [name] at [facility]. He was discharged on 06/18/21 on warfarin.   After discharge from  [facility], he struggled w/ CHF symptoms and volume overload. He presented to ED on 07/22/21 with SOB and noted to fluid overloaded, given IV Lasix and discharged w/ instructions to f/u w/ cardiology. Went back to ED on 07/23/21 at [facility] w/ recurrent dyspnea, CP and palpitations and noted to be in Afib w/ RVR and fluid overloaded. Started on amio gtt. This was later complicated by AMS/confusion, hypoglycemia (improved with D50) and hypoxic respiratory failure. He required intubation. ABG demonstrated metabolic acidosis, started on bicarb gtt and transferred to [facility] for further management. Admitted by PCCM on transfer. AHF team consulted for HF management.   Hospital Course     1. Acute on Chronic Biventricular Heart Failure>>Low Output Heart Failure / Cardiogenic Shock - BiV failure likely primarily valvular in nature in setting of severe MR - TEE 12/21 EF 40 to 45% with severe MR with multiple jets including a possible paravalvular leak. Moderate RV dysfunction severe TR - [facility] 3/22 showed normal coronaries, NICM EF 30-35%, severe MR w/ prominent v-waves in PCWP tracing, mild pulmonary venous HTN with normal CO (Fick cardiac output/index = 5.3/2.5). - Referred for re-do MVR 4/22 but did not follow-up w/ Dr. Roxy [name] - Admitted 12/22 for shock. Echo EF 20-25%, mild LV dilation, D-shaped septum, moderately decreased RV systolic function with mild RV enlargement, s/p MV repair with mean gradient 15 mmHg and severe MR, mild TR, dilated IVC - Transferred to [facility] and underwent mechanical MVR on 06/10/21 - Admitted this admission w/ a/c CHF/shock.  Suspect acute decompensation due to recurrent Afib w/ RVR. Shock felt primarily cardiogenic. - Echo this admission showed LVEF <20%, RV severely reduced. Mechanical MV ok, mGradient 3 mmHg - Treatment includes IV milrinone, IV bicarb drip, IV amiodarone, episodic IV Lasix dosing as dictated by CVP/volume status, and Tolvaptan - Blood pressure initially prohibited  aggressive GDMT but slowly re-added into hospitalization - Co-ox remains stable off milrinone, 74% - Volume status ok today - CVP 6.  -Dr. Aundra Dubin recommends the following at discharge: - Continue Lasix 40 mg daily  - Continue spironolactone 25 mg daily.   - Continue digoxin 0.125  - Continue Farxiga 10 mg daily  - Continue losartan 12.5 mg daily.  - Beta blocker discontinued in context of cardiogenic shock/admission issues - AHF team discussed with [place] team. Depending on his progress, may need to consider VAD/transplant down the road if candidate. Will need to follow closely.    2. Severe Mitral Valve Regurgitation s/p Mitral Valve Replacement  - previous MV repair in 2019 due to torn chord with subsequent recurrent MR - s/p mechanical MVR at [facility] 06/10/21 - repeat 2D echo w/ stable prothesis, mGradient 3 mmHg  - INR 9.6 on admit. Coumadin held initially, required pharmacy management with heparin/Coumadin during admission - INR 2.6 today - per pharmD, patient should take 4mg  tonight then resume prior home regimen of MWF 4mg , STThS 3mg  (total weekly 24mg ) - I have sent a message to our office's Coumadin scheduling team requesting INR recheck early this upcoming week and our office will call the patient with this information  3. Acute Hypoxic Respiratory Failure s/p Intubation  - 2/2 acute pulmonary edema - extubated 07/25/21 - improved with diuresis   4. Atrial Fibrillation w/ RVR  -  in setting of long standing MV disease, echo w/ severe LAE,  LA diam: 5.90 cm - Initially treated with IV amiodarone with spontaneous conversion to NSR - transitioned to oral form - maintenance of NSR felt extremely important - Maintaining SR at DC - Recommended to continue PO amio 200 mg bid - Further monitoring of organ systems while on amiodarone will be at discretion of primary cardiologist - Warfarin management as above   5. AKI  - baseline SCr ~1.0, 1.89 on admit - suspect cardiorenal, improved w/  inotropes  - normalized at discharge   6. Sacral Wounds/ Pyoderma gangrenosum - Multiple old and new pustules some bleeding. Open areas on sacrum (see images in CCM note)  - previously followed at [facility]  - Followed by [facility] team while admitted - per Dr. Aundra Dubin, no additional wound care recs at discharge - continue usual OP follow-up   7. Hypokalemia/hypomagnesemia - supplemented during admission as needed - discharge medications are outlined below as per discussion with Dr. Aundra Dubin   8. Shock liver - Observed earlier in admission, LFTs improving at discharge - Continue to follow as outpatient   9. Upper GI bleed - Dark OGT secretions.  - Gastroccult +  - Treated with PPI and Feraheme with plan to continue PPI at discharge - Hgb trending down 11>10.3>9.6>9.5, will need close OP f/u   10. Microcytic anemia - iron stores low - received feraheme 2/13   11. Hyponatremia - Nadir 124, Tolvaptan given 2/13 - 129 at discharge - will need to continue to follow as OP   The patient did not require SNF for rehab at discharge, was felt to be improving. No OT f/u recommended. PT suggesting OP Cardiac Rehab. Will send message to one of our cardiac rehab coordinators to ensure this is arranged. Per Dr. Aundra Dubin, will need close followup CHF clinic (this is already scheduled 08/08/21). Will send a message to our [facility] scheduler to help arrange Coumadin follow-up in [place] as requested. Discharge medications are outlined below as reviewed with MD. CHF team sent in majority to [facility] yesterday - I reviewed KCL dosing with MD at discharge and he felt 63meq daily was fine. We did notify the pt that his PPI rx was sent into his usual pharmacy since [facility] is no longer open today.    Did the patient have an acute coronary syndrome (MI, NSTEMI, STEMI, etc) this admission?:  No. Only minor troponin bump to peak 41. Low/flat. Not felt to represent ACS.                              Did the patient have a  percutaneous coronary intervention (stent / angioplasty)?:  No.    The patient has a f/u AHF clinic visit on 08/08/21.  A message has been sent to Dr. Clayborne Dana nurse for [facility] call.  _____________  Discharge Vitals Blood pressure 98/71, pulse 79, temperature 97.6 F (36.4 C), temperature source Oral, resp. rate 20, height 5\' 9"  (1.753 m), weight 90 kg, SpO2 98 %.  Filed Weights   07/31/21 0500 08/02/21 0557 08/03/21 0509  Weight: 86.6 kg 89.1 kg 90 kg    Labs & Radiologic Studies    CBC Recent Labs    08/02/21 0515 08/03/21 0520  WBC 8.1 8.1  HGB 9.6* 9.5*  HCT 30.1* 30.1*  MCV 77.6* 77.6*  PLT 257 [id number]   Basic Metabolic Panel Recent Labs    08/01/21  0540 08/02/21 0515 08/03/21 0520  NA 132* 130* 129*  K 3.5 3.6 3.9  CL 101 99 99  CO2 24 24 23   GLUCOSE 182* 131* 131*  BUN 16 14 15   CREATININE 1.04 0.94 0.99  CALCIUM 8.6* 8.6* 8.5*  MG 1.5* 1.8  --    Liver Function Tests Recent Labs    08/01/21 0540  AST 83*  ALT 103*  ALKPHOS 153*  BILITOT 0.8  PROT 7.9  ALBUMIN 2.5*   High Sensitivity Troponin:   Recent Labs  Lab 07/17/21 1320 07/17/21 1533 07/22/21 0248 07/22/21 0410  TROPONINIHS 33* 31* 37* 41*   _____________  DG Ankle 2 Views Right  Result Date: 07/17/2021 CLINICAL DATA:  Ankle pain EXAM: RIGHT ANKLE - 2 VIEW COMPARISON:  None. FINDINGS: There is no evidence of fracture, dislocation, or joint effusion. There is no evidence of arthropathy or other focal bone abnormality. Os trigonum or prominent Stieda process. Soft tissues are unremarkable. IMPRESSION: No evidence of fracture or dislocation.  No significant arthropathy. Electronically Signed   By: Keane Police D.O.   On: 07/17/2021 15:15   DG Chest Port 1 View  Result Date: 07/25/2021 CLINICAL DATA:  Hypoxia, respiratory failure EXAM: PORTABLE CHEST 1 VIEW COMPARISON:  Previous studies including the examination of 07/24/2021 FINDINGS: Transverse diameter of heart is increased. Central pulmonary  vessels are prominent. There are no signs of alveolar pulmonary edema or new focal infiltrates. Costophrenic angles are clear. There is no pneumothorax. Tip of endotracheal tube is 7 cm above the carina. Enteric tube is noted traversing esophagus. Tip of left IJ central venous catheter is seen in the superior vena cava. Metallic sutures are seen in the sternum. IMPRESSION: Cardiomegaly. Central pulmonary vessels are prominent without signs of alveolar pulmonary edema. There are no new focal infiltrates. Electronically Signed   By: Elmer Picker M.D.   On: 07/25/2021 08:12   DG CHEST PORT 1 VIEW  Result Date: 07/24/2021 CLINICAL DATA:  Central line placement. EXAM: PORTABLE CHEST 1 VIEW COMPARISON:  Same day. FINDINGS: Stable cardiomegaly. Endotracheal and nasogastric tubes are unchanged in position. Interval placement of left internal jugular catheter with distal tip in expected position of cavoatrial junction. No pneumothorax is noted. Lungs are clear. Bony thorax is unremarkable. Status post cardiac valve repair. IMPRESSION: Interval placement of left internal jugular catheter with distal tip in expected position of cavoatrial junction. Electronically Signed   By: Marijo Conception M.D.   On: 07/24/2021 15:24   DG Chest Port 1 View  Result Date: 07/24/2021 CLINICAL DATA:  Endotracheal tube placement EXAM: PORTABLE CHEST 1 VIEW COMPARISON:  07/22/2021 FINDINGS: Endotracheal tube is approximately 5 cm above the carina. Enteric tube is within the stomach. Similar enlargement of the cardiac silhouette. Similar lung aeration. No significant pleural effusion. No pneumothorax. IMPRESSION: Stable lines and tubes. Decreased pulmonary vascular congestion. Similar cardiomegaly. Superimposed pericardial effusion not excluded. Electronically Signed   By: Macy Mis M.D.   On: 07/24/2021 10:48   DG Chest Portable 1 View  Result Date: 07/22/2021 CLINICAL DATA:  Chest pain, shortness of breath EXAM: PORTABLE  CHEST 1 VIEW COMPARISON:  07/17/2021 FINDINGS: Prior median sternotomy and valve replacement. Cardiomegaly, vascular congestion. No overt edema. No confluent opacities or effusions. IMPRESSION: Cardiomegaly, vascular congestion. Electronically Signed   By: Rolm Baptise M.D.   On: 07/22/2021 02:53   DG Chest Portable 1 View  Result Date: 07/17/2021 CLINICAL DATA:  Short of breath. EXAM: PORTABLE CHEST 1 VIEW COMPARISON:  05/29/2021 FINDINGS: Cardiac enlargement with valve replacement. Mild vascular congestion. Negative for edema or effusion. Lungs clear without infiltrate or pneumonia. IMPRESSION: Cardiac enlargement with mild vascular congestion. Negative for edema. Electronically Signed   By: Franchot Gallo M.D.   On: 07/17/2021 13:25   ECHOCARDIOGRAM COMPLETE  Result Date: 07/24/2021    ECHOCARDIOGRAM REPORT   Patient Name:   BELEN LALLO Date of Exam: 07/24/2021 Medical Rec #:  [id number]           Height:       69.0 in Accession #:    [id number]          Weight:       214.8 lb Date of Birth:  Oct 29, 1985          BSA:          2.130 m Patient Age:    52 years            BP:           102/84 mmHg Patient Gender: M                   HR:           81 bpm. Exam Location:  Inpatient Procedure: 2D Echo, Cardiac Doppler, Color Doppler and Intracardiac            Opacification Agent Indications:    Dyspnea  History:        Patient has prior history of Echocardiogram examinations, most                 recent 05/31/2021. CHF, Previous Myocardial Infarction, S/P                 mechanical MVR, Arrythmias:Atrial Fibrillation;                 Signs/Symptoms:Murmur.  Sonographer:    Luisa Hart RDCS Referring Phys: [phone] [place]  1. Left ventricular ejection fraction, by estimation, is <20%. The left ventricle has severely decreased function. The left ventricle demonstrates global hypokinesis. The left ventricular internal cavity size was severely dilated. Left ventricular diastolic parameters are  consistent with Grade II diastolic dysfunction (pseudonormalization).  2. Right ventricular systolic function is severely reduced. The right ventricular size is moderately enlarged. There is mildly elevated pulmonary artery systolic pressure.  3. S/p mitral valve repair. MV mean gradient 3 mmHg HR 81 bpm. Significant shadowing, MR has improved.  4. The aortic valve is grossly normal. Aortic valve regurgitation is mild.  5. The inferior vena cava is dilated in size with <50% respiratory variability, suggesting right atrial pressure of 15 mmHg. Conclusion(s)/Recommendation(s): Compared to echo 05/31/2021, MV mean gradient and MR have improved. FINDINGS  Left Ventricle: Left ventricular ejection fraction, by estimation, is <20%. The left ventricle has severely decreased function. The left ventricle demonstrates global hypokinesis. The left ventricular internal cavity size was severely dilated. Left ventricular diastolic parameters are consistent with Grade II diastolic dysfunction (pseudonormalization). Right Ventricle: The right ventricular size is moderately enlarged. No increase in right ventricular wall thickness. Right ventricular systolic function is severely reduced. There is mildly elevated pulmonary artery systolic pressure. The tricuspid regurgitant velocity is 2.38 m/s, and with an assumed right atrial pressure of 15 mmHg, the estimated right ventricular systolic pressure is 0000000 mmHg. Left Atrium: Left atrial size was not well visualized. Right Atrium: Right atrial size was normal in size. Pericardium: There is no evidence of pericardial effusion. Mitral Valve: S/p mitral  valve repair. MV mean gradient 3 mmHg HR 81 bpm. Significant shadowing, MR has improved. MV peak gradient, 7.4 mmHg. The mean mitral valve gradient is 3.0 mmHg. Tricuspid Valve: The tricuspid valve is grossly normal. Tricuspid valve regurgitation is mild. Aortic Valve: The aortic valve is grossly normal. Aortic valve regurgitation is mild.  Aortic regurgitation PHT measures 816 msec. Aortic valve mean gradient measures 2.0 mmHg. Aortic valve peak gradient measures 3.1 mmHg. Aortic valve area, by VTI measures 3.14 cm. Pulmonic Valve: The pulmonic valve was grossly normal. Pulmonic valve regurgitation is mild to moderate. Aorta: The aortic root and ascending aorta are structurally normal, with no evidence of dilitation. Venous: The inferior vena cava is dilated in size with less than 50% respiratory variability, suggesting right atrial pressure of 15 mmHg. IAS/Shunts: The interatrial septum was not well visualized.  LEFT VENTRICLE PLAX 2D LVIDd:         7.10 cm      Diastology LVIDs:         6.20 cm      LV e' medial:    3.77 cm/s LV PW:         1.20 cm      LV E/e' medial:  30.5 LV IVS:        0.80 cm      LV e' lateral:   4.91 cm/s LVOT diam:     2.80 cm      LV E/e' lateral: 23.4 LV SV:         46 LV SV Index:   22 LVOT Area:     6.16 cm  LV Volumes (MOD) LV vol d, MOD A4C: 281.0 ml LV vol s, MOD A4C: 258.0 ml LV SV MOD A4C:     281.0 ml RIGHT VENTRICLE RV Basal diam:  5.60 cm RV Mid diam:    3.80 cm RV S prime:     5.78 cm/s TAPSE (M-mode): 2.0 cm LEFT ATRIUM             Index LA diam:        5.90 cm 2.77 cm/m LA Vol (A2C):   72.1 ml 33.86 ml/m LA Vol (A4C):   70.4 ml 33.06 ml/m LA Biplane Vol: 79.0 ml 37.10 ml/m  AORTIC VALVE                    PULMONIC VALVE AV Area (Vmax):    4.03 cm     PV Vmax:          0.69 m/s AV Area (Vmean):   4.06 cm     PV Vmean:         48.300 cm/s AV Area (VTI):     3.14 cm     PV VTI:           0.128 m AV Vmax:           88.10 cm/s   PV Peak grad:     1.9 mmHg AV Vmean:          60.300 cm/s  PV Mean grad:     1.0 mmHg AV VTI:            0.146 m      PR End Diast Vel: 11.42 msec AV Peak Grad:      3.1 mmHg AV Mean Grad:      2.0 mmHg LVOT Vmax:         57.70 cm/s LVOT Vmean:  39.800 cm/s LVOT VTI:          0.074 m LVOT/AV VTI ratio: 0.51 AI PHT:            816 msec  AORTA Ao Root diam: 3.50 cm Ao Asc diam:   3.50 cm MITRAL VALVE                TRICUSPID VALVE MV Area (PHT): 5.88 cm     TR Peak grad:   22.7 mmHg MV Area VTI:   1.82 cm     TR Vmax:        238.00 cm/s MV Peak grad:  7.4 mmHg MV Mean grad:  3.0 mmHg     SHUNTS MV Vmax:       1.36 m/s     Systemic VTI:  0.07 m MV Vmean:      82.5 cm/s    Systemic Diam: 2.80 cm MV Decel Time: 129 msec MV E velocity: 115.00 cm/s MV A velocity: 53.80 cm/s MV E/A ratio:  2.14 Mary Scientist, physiological signed by Phineas Inches Signature Date/Time: 07/24/2021/4:17:25 PM    Final    [place] EKG SITE RITE  Result Date: 07/31/2021 If Site Rite image not attached, placement could not be confirmed due to current cardiac rhythm.  Disposition   Pt is being discharged home today in stable condition.  Follow-up Plans & Appointments     Follow-up Information     [facility] Follow up.   Specialty: Cardiology Why: Follow up at the [facility] at [facility] on Thursday Aug 08, 2021 at 11:30 AM. Please arrive 10-15 minutes early to check in. Contact information: [address] [id number] [place] Follow up.   Why: Address: [address] [place] [address] please arrive to your appt 15 minutes early, Monday Aug 05, 2021 bring proof of income bring hospital discharge paperwork, bring ID Contact information: [address] [place] [address] [phone]         [facility] Follow up.   Specialty: Cardiology Why: [facility] office will call you to arrange recheck of your Coumadin level. Contact information: [place] [phone]               Discharge Instructions     Diet - low sodium heart healthy   Complete by: As directed    Discharge instructions   Complete by: As directed    The majority of  your medicines were filled by the [facility], but on day of discharge, Dr. Aundra Dubin added one more medicine (pantoprazole). This was sent into the [facility] in [place] since the [place] is not open today. Please make sure to pick this up.  Please see special instructions below on your Coumadin (warfarin) dose today.   Discharge wound care:   Complete by: As directed    Continue to follow with your wound care team as outpatient   Increase activity slowly   Complete by: As directed        Discharge Medications   Allergies as of 08/03/2021   No Known Allergies      Medication List     STOP taking these medications    metoprolol succinate 50 MG 24 hr tablet  Commonly known as: TOPROL-XL   predniSONE 10 MG tablet Commonly known as: DELTASONE       TAKE these medications    acetaminophen 325 MG tablet Commonly known as: TYLENOL Take 2 tablets (650 mg total) by mouth every 4 (four) hours as needed for headache or mild pain.   amiodarone 200 MG tablet Commonly known as: PACERONE Take 1 tablet (200 mg total) by mouth 2 (two) times daily.   digoxin 0.125 MG tablet Commonly known as: LANOXIN Take 1 tablet (0.125 mg total) by mouth daily.   Farxiga 10 MG Tabs tablet Generic drug: dapagliflozin propanediol Take 1 tablet (10 mg total) by mouth daily.   furosemide 40 MG tablet Commonly known as: LASIX Take 1 tablet (40 mg total) by mouth daily.   losartan 25 MG tablet Commonly known as: COZAAR Take 0.5 tablets (12.5 mg total) by mouth daily.   magnesium oxide 400 (240 Mg) MG tablet Commonly known as: MAG-OX Take 200 mg by mouth 2 (two) times daily.   oxyCODONE-acetaminophen 5-325 MG tablet Commonly known as: Percocet Take 1 tablet by mouth every 4 (four) hours as needed for severe pain.   pantoprazole 40 MG tablet Commonly known as: PROTONIX Take 1 tablet (40 mg total) by mouth daily. Start taking on: August 04, 2021   potassium chloride SA 20 MEQ tablet Commonly known as: KLOR-CON M Take 2 tablets (40 mEq total) by mouth daily.   spironolactone 25 MG tablet Commonly known as: ALDACTONE Take 1 tablet (25 mg total) by mouth daily. What changed: how much to take   warfarin 2 MG tablet Commonly known as: COUMADIN Take as directed. If you are unsure how to take this medication, talk to your nurse or doctor. Original instructions: On the evening of 08/03/21 take 2 tablets by mouth (4mg  total). O 08/04/21, resume usual home dose of 1 1/2 tablets daily except 2 tablets on Mondays, Wednesdays and Fridays then as directed after follow-up. What changed: additional instructions               Discharge Care Instructions  (From admission, onward)           Start     Ordered   08/03/21 0000  Discharge wound care:       Comments: Continue to follow with your wound care team as outpatient   08/03/21 0934               Outstanding Labs/Studies   See recommendations above.  Duration of Discharge Encounter   Greater than 30 minutes including physician time.  Signed, Charlie Pitter, PA-C 08/03/2021, 9:47 AM  Patient seen with PA, agree with the above note.   OK for discharge today on the above regimen with close followup.   Loralie Champagne 08/04/2021

## 2021-08-03 NOTE — Progress Notes (Signed)
Pt has been ambulating with PT and MT. Discussed with pt HF booklet including daily wts, signs of fluid, low sodium diet, fluid intake, and exercise. Pt receptive. Will refer to [facility] CRPII as he is pursuing insurance/disability.  [id number] Ethelda Chick CES, ACSM 10:52 AM 08/03/2021

## 2021-08-03 NOTE — Plan of Care (Signed)
Problem: Education: Goal: Knowledge of General Education information will improve Description: Including pain rating scale, medication(s)/side effects and non-pharmacologic comfort measures Outcome: Completed/Met   Problem: Health Behavior/Discharge Planning: Goal: Ability to manage health-related needs will improve Outcome: Completed/Met   Problem: Clinical Measurements: Goal: Ability to maintain clinical measurements within normal limits will improve Outcome: Completed/Met Goal: Will remain free from infection Outcome: Completed/Met Goal: Diagnostic test results will improve Outcome: Completed/Met Goal: Respiratory complications will improve Outcome: Completed/Met Goal: Cardiovascular complication will be avoided Outcome: Completed/Met   Problem: Activity: Goal: Risk for activity intolerance will decrease Outcome: Completed/Met   Problem: Nutrition: Goal: Adequate nutrition will be maintained Outcome: Completed/Met   Problem: Coping: Goal: Level of anxiety will decrease Outcome: Completed/Met   Problem: Elimination: Goal: Will not experience complications related to bowel motility Outcome: Completed/Met Goal: Will not experience complications related to urinary retention Outcome: Completed/Met   Problem: Pain Managment: Goal: General experience of comfort will improve Outcome: Completed/Met   Problem: Safety: Goal: Ability to remain free from injury will improve Outcome: Completed/Met   Problem: Skin Integrity: Goal: Risk for impaired skin integrity will decrease Outcome: Completed/Met   Problem: Acute Rehab OT Goals (only OT should resolve) Goal: Pt. Will Perform Grooming Outcome: Completed/Met Goal: Pt. Will Perform Lower Body Dressing Outcome: Completed/Met Goal: Pt. Will Transfer To Toilet Outcome: Completed/Met Goal: Pt. Will Perform Tub/Shower Transfer Outcome: Completed/Met   Problem: Education: Goal: Ability to demonstrate management of  disease process will improve Outcome: Completed/Met Goal: Ability to verbalize understanding of medication therapies will improve Outcome: Completed/Met Goal: Individualized Educational Video(s) Outcome: Completed/Met   Problem: Activity: Goal: Capacity to carry out activities will improve Outcome: Completed/Met   Problem: Cardiac: Goal: Ability to achieve and maintain adequate cardiopulmonary perfusion will improve Outcome: Completed/Met

## 2021-08-03 NOTE — TOC Transition Note (Signed)
Transition of Care [facility]) - CM/SW Discharge Note   Patient Details  Name: Donald [name] MRN: [id number] Date of Birth: 11-26-1985  Transition of Care [facility]) CM/SW Contact:  Bartholomew Crews, RN Phone Number: [phone] 08/03/2021, 12:43 PM   Clinical Narrative:     Patient to transition home today. Noted DC meds sent to [facility] yesterday and are stored in main pharmacy. Nursing to pick up meds in main pharmacy prior to dc home.   Final next level of care: Home/Self Care Barriers to Discharge: No Barriers Identified   Patient Goals and CMS Choice Patient states their goals for this hospitalization and ongoing recovery are:: wants to get better CMS Medicare.gov Compare Post Acute Care list provided to:: Patient Choice offered to / list presented to : Patient  Discharge Placement                       Discharge Plan and Services In-house Referral: Clinical Social Work Discharge Planning Services: CM Consult Post Acute Care Choice: IP Rehab                               Social Determinants of Health (SDOH) Interventions     Readmission Risk Interventions No flowsheet data found.

## 2021-08-06 ENCOUNTER — Telehealth: Payer: Self-pay | Admitting: *Deleted

## 2021-08-06 NOTE — Telephone Encounter (Signed)
Called patient regarding an appointment for coumdin. No answer. Per Edrick Oh needs needs early follow-up of INR this upcoming week. If unable to accomodate in the [place] offices, please coordinate with the [place] office and notify patient of appt info. Thank you!

## 2021-08-06 NOTE — Progress Notes (Signed)
Advanced Heart Failure Clinic Note   Referring Physician: PCP: Patient, No Pcp Per (Inactive) PCP-Cardiologist: Donald Dolly, MD  [facility]: Dr. Haroldine [name]   HPI:  Donald [name] is a 36 y.o. male with history of mitral regurgitation (s/p MV repair with resection of ruptured anterior papillary muscle and reconstruction of papillary chord and placement of annuloplasty ring in 2019). 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    The patient has been followed by Dr. Harl [name]. Patient has had multiple episodes of acute exacerbation of fluid overload in 2020 and 2021 with at least one ER visit for acute pulmonary edema although he did not require hospitalization. Echo 11/21 EF 45% with  severe MR. TEE. 12/21 EF 40 to 45% with severe MR with multiple jets including a possible paravalvular leak, severe TR. The RV was moderate to severely HK with evidence of significant PH He has had several episodes of AFL with RVR, requiring DCCVs.    He saw Dr. Roxy [name] in 2/22 for consideration of repeat MV surgery and subsequently referred to the [facility] for CHF evaluation/ optimization and RHC prior to potential re-do MVR. Saw Dr. Haroldine [name] for consultation and set up for [facility] 09/11/20 which showed normal coronaries, NICM EF 30-35%, severe MR w/ prominent v-waves in PCWP tracing, mild pulmonary venous HTN with normal CO (Fick cardiac output/index = 5.3/2.5).   He was scheduled to f/u w/ Dr. Roxy [name] in early April to further discuss surgery but he canceled the appt. Appointment was rescheduled but he cacellled again. Was also arranged to undergo dental extractions by Dr. Benson [name] but canceled as well.   He was recently hospitalized for  a/c CHF in December. Presented to [facility] 05/29/21 with recurrent HF and evidence of low output and AKI (Scr 0.97 ->-> 2.07). Lactic acid 3.5. Echo with EF 20-25% with severe MR/mod MS and severe RV dysfunction.    Transferred to [facility] on 05/30/21 for further management. Started on empiric milrinone. PICC placed. Due to low mixed venous saturation milrinone increased to 0.5 mcg with modest improvement . AKI resolved with addition of milrinone. CVP initially high but improved with IV lasix. Dr Donald [name] reviewed case with [facility] regarding MVR but he was felt to be too high-risk and they recommended transfer to [facility]. He underwent MVR w/ mechanical valve on 12/26 by Dr. Wannetta [name] at [facility].  He was discharged on 1/30 on warfarin.   Following d/c from [place], he struggled w/ CHF symptoms and volume overload. Went to ED at [facility] on 2/6 with SOB and noted to be fluid overloaded, given IV Lasix and discharged w/ instructions to f/u w/ cardiology. Went back to ED on 2/7 at [facility] w/ recurrent dyspnea, CP and palpitations and noted to be in Afib w/ RVR and fluid overloaded. Started on amio gtt. This was later complicated by AMS/confusion, hypoglycemia ( improved with D50) and hypoxic respiratory failure. Required intubation. ABG demonstrated metabolic acidosis, started on bicarb gtt and transferred to [facility] for further management. Admitted by PCCM on transfer. AHF team consulted for HF management. Echo showed LVEF <20%, RV severely reduced. Mechanical MV ok, mGradient 3 mmHg. Acute decompensation felt 2/2 being back in rapid afib. Required initiation of milrinone for shock and placed on amio gtt. Diuresed w/  IV Lasix. Blood cultures were negative. Diuresed well and extubated. Converted back to NSR on amio gtt. Able to wean off milrinone w/ stable co-ox. GDMT titrated. Transitioned to PO amio for rhythm control. Discharged home on 2/18. D/c Wt was 198 lb (CVP 6). Plan is to follow closely.  Depending  on his progress, may need to consider VAD/transplant down the road if candidate.  He presents to clinic today for post hospital f/u. EKG shows NSR. Denies palpitations.  Wt appears up 7 lb post hospital from 198>>205 but suspect clothing contributing. He reports his home wt this morning was 200 lb. ReDs clip 35%. He reports feeling well. No significant limitations w/ ALDs. NYHA Class II. Denies orthopnea/PND. No LEE. Reports full med compliance. BP 112/78 after AM meds. Tolerating meds ok w/o side effects. No dizziness or fatigue. He reports compliance w/ coumadin but has not had INR check since discharge.   He also complains of left foot pain. No trauma.   Remains tobacco free since 05/2021.   Echo 07/24/21 2D Echo Today    1. Left ventricular ejection fraction, by estimation, is <20%. The left  ventricle has severely decreased function. The left ventricle demonstrates  global hypokinesis. The left ventricular internal cavity size was severely  dilated. Left ventricular  diastolic parameters are consistent with Grade II diastolic dysfunction  (pseudonormalization).   2. Right ventricular systolic function is severely reduced. The right  ventricular size is moderately enlarged. There is mildly elevated  pulmonary artery systolic pressure.   3. S/p mitral valve repair. MV mean gradient 3 mmHg HR 81 bpm.  Significant shadowing, MR has improved.   4. The aortic valve is grossly normal. Aortic valve regurgitation is  mild.   5. The inferior vena cava is dilated in size with <50% respiratory  variability, suggesting right atrial pressure of 15 mmHg.   Conclusion(s)/Recommendation(s): Compared to echo 05/31/2021, MV mean  gradient and MR have improved.     Review of Systems: [y] = yes, [ ]  = no   General: Weight gain [ Y]; Weight loss [ ] ; Anorexia [ ] ; Fatigue [ ] ; Fever [ ] ; Chills [ ] ; Weakness [ ]   Cardiac: Chest pain/pressure [ ] ; Resting SOB [ ] ; Exertional SOB [ Y]; Orthopnea [ ] ;  Pedal Edema [ ] ; Palpitations [ ] ; Syncope [ ] ; Presyncope [ ] ; Paroxysmal nocturnal dyspnea[ ]   Pulmonary: Cough [ ] ; Wheezing[ ] ; Hemoptysis[ ] ; Sputum [ ] ; Snoring [ ]   GI: Vomiting[ ] ; Dysphagia[ ] ; Melena[ ] ; Hematochezia [ ] ; Heartburn[ ] ; Abdominal pain [ ] ; Constipation [ ] ; Diarrhea [ ] ; BRBPR [ ]   GU: Hematuria[ ] ; Dysuria [ ] ; Nocturia[ ]   Vascular: Pain in legs with walking [ ] ; Pain in feet with lying flat [ ] ; Non-healing sores [ ] ; Stroke [ ] ; TIA [ ] ; Slurred speech [ ] ;  Neuro: Headaches[ ] ; Vertigo[ ] ; Seizures[ ] ; Paresthesias[ ] ;Blurred vision [ ] ; Diplopia [ ] ; Vision changes [ ]   Ortho/Skin: Arthritis [ ] ; Joint pain [Y ]; Muscle pain [ ] ; Joint swelling [ ] ; Back Pain [ ] ; Rash [ ]   Psych: Depression[ ] ; Anxiety[ ]   Heme: Bleeding problems [ ] ; Clotting disorders [ ] ; Anemia [ ]   Endocrine: Diabetes [ ] ; Thyroid dysfunction[ ]    Past Medical History:  Diagnosis Date   Anemia    Autoimmune disorder (HCC)    pyoderma gangrenosum   CHF (congestive heart failure) (HCC)    Chronic systolic heart failure ([place])  a. EF 35-40% by echo in 07/2018 b. EF at 45% by repeat echo in 04/2020   DVT (deep venous thrombosis) (HCC)    h/o   Dysrhythmia    Mitral regurgitation    a. 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 b. 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HCC)    arthritis   Tricuspid regurgitation     Current Outpatient Medications  Medication Sig Dispense Refill   acetaminophen (TYLENOL) 325 MG tablet Take 2 tablets (650 mg total) by mouth every 4 (four) hours as needed for headache or mild pain.     amiodarone (PACERONE) 200 MG tablet Take 1 tablet (200 mg total) by mouth 2 (two) times daily. 60 tablet 1   dapagliflozin propanediol (FARXIGA)  10 MG TABS tablet Take 1 tablet (10 mg total) by mouth daily. 30 tablet 1   digoxin (LANOXIN) 0.125 MG tablet Take 1 tablet (0.125 mg total) by mouth daily. 30 tablet 1   furosemide (LASIX) 40 MG tablet Take 1 tablet (40 mg total) by mouth daily. 30 tablet 1   magnesium oxide (MAG-OX) 400 (240 Mg) MG tablet Take 200 mg by mouth 2 (two) times daily.     oxyCODONE-acetaminophen (PERCOCET) 5-325 MG tablet Take 1 tablet by mouth every 4 (four) hours as needed for severe pain. 20 tablet 0   pantoprazole (PROTONIX) 40 MG tablet Take 1 tablet (40 mg total) by mouth daily. 30 tablet 2   potassium chloride SA (KLOR-CON M) 20 MEQ tablet Take 2 tablets (40 mEq total) by mouth daily. 60 tablet 1   spironolactone (ALDACTONE) 25 MG tablet Take 1 tablet (25 mg total) by mouth daily. 30 tablet 1   warfarin (COUMADIN) 2 MG tablet On the evening of 08/03/21 take 2 tablets by mouth (4mg  total). O 08/04/21, resume usual home dose of 1 1/2 tablets daily except 2 tablets on Mondays, Wednesdays and Fridays then as directed after follow-up.     losartan (COZAAR) 25 MG tablet Take 1 tablet (25 mg total) by mouth daily. 30 tablet 1   No current facility-administered medications for this encounter.    No Known Allergies    Social History   Socioeconomic History   Marital status: Divorced    Spouse name: Not on file   Number of children: Not on file   Years of education: Not on file   Highest education level: Not on file  Occupational History   Not on file  Tobacco Use   Smoking status: Former    Types: Cigarettes    Quit date: 10/14/2020    Years since quitting: 0.8   Smokeless tobacco: Never  Vaping Use   Vaping Use: Never used  Substance and Sexual Activity   Alcohol use: No   Drug use: No   Sexual activity: Yes  Other Topics Concern   Not on file  Social History Narrative   Not on file   Social Determinants of Health   Financial Resource Strain: Not on file  Food Insecurity: Not on file   Transportation Needs: Not on file  Physical Activity: Not on file  Stress: Not on file  Social Connections: Not on file  Intimate Partner Violence: Not on file      Family History  Problem Relation  Age of Onset   Multiple sclerosis Mother    Psoriasis Mother    Depression Father    Diabetes Father    Diabetes Paternal Grandmother     Vitals:   08/08/21 1145  BP: 112/78  Pulse: 80  SpO2: 98%  Weight: 93 kg (205 lb)     PHYSICAL EXAM: ReDs Clip 35%  General:  Well appearing. No respiratory difficulty HEENT: normal Neck: supple. JVD 7 cm. Carotids 2+ bilat; no bruits. No lymphadenopathy or thyromegaly appreciated. Cor: PMI nondisplaced. Regular rate & rhythm. No rubs, gallops or murmurs. Lungs: clear Abdomen: soft, nontender, nondistended. No hepatosplenomegaly. No bruits or masses. Good bowel sounds. Extremities: no cyanosis, clubbing, rash, edema Neuro: alert & oriented x 3, cranial nerves grossly intact. moves all 4 extremities w/o difficulty. Affect pleasant.  ECG: NSR 78 bpm, personally reviewed    ASSESSMENT & PLAN:  1. Chronic Biventricular Heart Failure  - BiV failure likely primarily valvular in nature in setting of severe MR - TEE12/21 EF 40 to 45% with severe MR with multiple jets including a possible paravalvular leak. Miderate RV dysfunction severe TR - [facility] 3/22 showed normal coronaries, NICM EF 30-35%, severe MR w/ prominent v-waves in PCWP tracing, mild pulmonary venous HTN with normal CO (Fick cardiac output/index = 5.3/2.5). - Referred for re-do MVR 4/22 but did not follow-up w/ Dr. Roxy [name] - Admit 12/22 for shock. Echo EF 20-25%, mild LV dilation, D-shaped septum, moderately decreased RV systolic function with mild RV enlargement, s/p MV repair with mean gradient 15 mmHg and severe MR, mild TR, dilated IVC - Transferred to [facility] and underwent mechanical MVR on 12/26 - Recent admit 2/23 w/ acute decompensated HF/ shock in the setting of recurrent Afib w/  RVR. Echo LVEF <20%, RV severely reduced. Mechanical MV ok, mGradient 3 mmHg. Required stabilization w/ milrinone. Converted to NSR w/ amio and diuresed w/ Lasix. Weaned off milrinone w/ stable co-ox   - NYHA Class II. Volume status mildly elevated. ReDs 35%   - Advised to take extra dose of Lasix tonight w/ 20 mEq of KCl then return to lasix 40 mg daily  - Continue spironolactone 25 mg daily.   - Continue digoxin 0.125. Check Dig level   - Continue Farxiga 10 mg daily  - Increase losartan to 25 mg daily. If able to tolerate increase, will switch to [facility] next visit.  - Discussed with [facility] team. Depending on his progress, may need to consider VAD/transplant down the road if candidate. Will need to follow closely.  - maintaince of NSR will be imperative (see below)  - continue gradual med titration q2-3 weeks - plan repeat echo in 6-8 weeks, if EF not improving, will plan CPX testing    2. Severe Mitral Valve Regurgitation s/p Mitral Valve Replacement  - previous MV repair in 2019 due to torn chord with subsequent recurrent MR - s/p mechanical MVR at [facility] 06/10/21 - repeat 2D echo 2/23 w/ stable prothesis, mGradient 3 mmHg  - on coumadin for mechanical valve. INR goal 2.5-3.5  - check INR today. Will refer to [facility] cou[facility] for monitoring   3. Paroxsymal Atrial Fibrillation - in setting of long standing MV disease, echo w/ severe LAE,  LA diam: 5.90 cm - unfortunately did not get MAZE at time of MVR  - he does not tolerate AF well w/ concomitant severe biventricular failure. Maintaince of NSR will be imperative - in NSR on EKG today  - Reduce amio to 200 mg  daily. Will need HFTs and TSH in 4-6 weeks  - Warfarin management as above   4. Recent AKI  - during recent hospitalization, in the setting of low output HF/cardiorenal  - Scr peaked to 1.9 (baseline ~1.0) - improved w/ milrinone and diuresis, 0.99 at d/c  - check CMP today   5. H/o Tobacco Use - quit smoking  12/22 - congratulated on efforts  - explained he would need to be tobacco free x 6 months to be considered for transplant  F/u w/ APP in 2-3 weeks for further med titration. 6-8 weeks w/ f/u echo and appt w/ Dr. Odelia Gage, PA-C 08/08/21

## 2021-08-08 ENCOUNTER — Encounter (HOSPITAL_COMMUNITY): Payer: Self-pay

## 2021-08-08 ENCOUNTER — Other Ambulatory Visit (HOSPITAL_COMMUNITY): Payer: Self-pay

## 2021-08-08 ENCOUNTER — Other Ambulatory Visit: Payer: Self-pay

## 2021-08-08 ENCOUNTER — Ambulatory Visit (HOSPITAL_COMMUNITY)
Admit: 2021-08-08 | Discharge: 2021-08-08 | Disposition: A | Discharge status: Home / Self Care | Payer: Medicaid Other | Attending: Cardiology | Admitting: Cardiology

## 2021-08-08 VITALS — BP 112/78 | HR 80 | Wt 205.0 lb

## 2021-08-08 DIAGNOSIS — I5082 Biventricular heart failure: Secondary | ICD-10-CM | POA: Insufficient documentation

## 2021-08-08 DIAGNOSIS — I083 Combined rheumatic disorders of mitral, aortic and tricuspid valves: Secondary | ICD-10-CM | POA: Diagnosis not present

## 2021-08-08 DIAGNOSIS — I482 Chronic atrial fibrillation, unspecified: Secondary | ICD-10-CM | POA: Diagnosis not present

## 2021-08-08 DIAGNOSIS — I5022 Chronic systolic (congestive) heart failure: Secondary | ICD-10-CM | POA: Insufficient documentation

## 2021-08-08 DIAGNOSIS — I110 Hypertensive heart disease with heart failure: Secondary | ICD-10-CM | POA: Insufficient documentation

## 2021-08-08 DIAGNOSIS — I272 Pulmonary hypertension, unspecified: Secondary | ICD-10-CM | POA: Insufficient documentation

## 2021-08-08 DIAGNOSIS — I428 Other cardiomyopathies: Secondary | ICD-10-CM | POA: Insufficient documentation

## 2021-08-08 DIAGNOSIS — Z952 Presence of prosthetic heart valve: Secondary | ICD-10-CM | POA: Diagnosis not present

## 2021-08-08 DIAGNOSIS — D8989 Other specified disorders involving the immune mechanism, not elsewhere classified: Secondary | ICD-10-CM | POA: Insufficient documentation

## 2021-08-08 DIAGNOSIS — M79672 Pain in left foot: Secondary | ICD-10-CM | POA: Diagnosis not present

## 2021-08-08 DIAGNOSIS — M4680 Other specified inflammatory spondylopathies, site unspecified: Secondary | ICD-10-CM | POA: Diagnosis not present

## 2021-08-08 DIAGNOSIS — I480 Paroxysmal atrial fibrillation: Secondary | ICD-10-CM | POA: Diagnosis not present

## 2021-08-08 DIAGNOSIS — Z79899 Other long term (current) drug therapy: Secondary | ICD-10-CM | POA: Diagnosis not present

## 2021-08-08 DIAGNOSIS — M2550 Pain in unspecified joint: Secondary | ICD-10-CM | POA: Insufficient documentation

## 2021-08-08 DIAGNOSIS — Z7901 Long term (current) use of anticoagulants: Secondary | ICD-10-CM | POA: Diagnosis not present

## 2021-08-08 DIAGNOSIS — Z86718 Personal history of other venous thrombosis and embolism: Secondary | ICD-10-CM | POA: Diagnosis not present

## 2021-08-08 DIAGNOSIS — Z7984 Long term (current) use of oral hypoglycemic drugs: Secondary | ICD-10-CM | POA: Insufficient documentation

## 2021-08-08 DIAGNOSIS — Z87891 Personal history of nicotine dependence: Secondary | ICD-10-CM | POA: Insufficient documentation

## 2021-08-08 DIAGNOSIS — L88 Pyoderma gangrenosum: Secondary | ICD-10-CM | POA: Insufficient documentation

## 2021-08-08 DIAGNOSIS — K529 Noninfective gastroenteritis and colitis, unspecified: Secondary | ICD-10-CM | POA: Insufficient documentation

## 2021-08-08 LAB — BASIC METABOLIC PANEL
Anion gap: 13 (ref 5–15)
BUN: 12 mg/dL (ref 6–20)
CO2: 20 mmol/L — ABNORMAL LOW (ref 22–32)
Calcium: 9.4 mg/dL (ref 8.9–10.3)
Chloride: 101 mmol/L (ref 98–111)
Creatinine, Ser: 0.94 mg/dL (ref 0.61–1.24)
GFR, Estimated: >60 mL/min (ref >60)
Glucose, Bld: 93 mg/dL (ref 70–99)
Potassium: 4.3 mmol/L (ref 3.5–5.1)
Sodium: 134 mmol/L — ABNORMAL LOW (ref 135–145)

## 2021-08-08 LAB — DIGOXIN LEVEL: Digoxin Level: 0.8 ng/mL (ref 0.8–2.0)

## 2021-08-08 LAB — PROTIME-INR
INR: 2.5 — ABNORMAL HIGH (ref 0.8–1.2)
Prothrombin Time: 27.4 seconds — ABNORMAL HIGH (ref 11.4–15.2)

## 2021-08-08 LAB — URIC ACID: Uric Acid, Serum: 5.8 mg/dL (ref 3.7–8.6)

## 2021-08-08 MED ORDER — LOSARTAN POTASSIUM 25 MG PO TABS
25.0000 mg | ORAL_TABLET | Freq: Every day | ORAL | 1 refills | Status: DC
  Filled 2021-08-08 – 2021-08-20 (×2): qty 30, 30d supply, fill #0

## 2021-08-08 MED ORDER — AMIODARONE HCL 200 MG PO TABS
200.0000 mg | ORAL_TABLET | Freq: Every day | ORAL | 1 refills | Status: DC
  Filled 2021-08-08 – 2021-08-20 (×2): qty 30, 30d supply, fill #0

## 2021-08-08 NOTE — Patient Instructions (Addendum)
INCREASE Losartan to 25 mg one tab daily DECREASE Amiodarone to 200 mg one tab daily   Take an extra Lasix 40 mg today with additional 20 meq off potassium today  Labs today We will only contact you if something comes back abnormal or we need to make some changes. Otherwise no news is good news!  Your physician recommends that you schedule a follow-up appointment in: 2-3 weeks  in the Advanced Practitioners (PA/NP) Clinic     Do the following things EVERYDAY: Weigh yourself in the morning before breakfast. Write it down and keep it in a log. Take your medicines as prescribed Eat low salt foods--Limit salt (sodium) to 2000 mg per day.  Stay as active as you can everyday Limit all fluids for the day to less than 2 liters  At the Advanced Heart Failure Clinic, you and your health needs are our priority. As part of our continuing mission to provide you with exceptional heart care, we have created designated Provider Care Teams. These Care Teams include your primary Cardiologist (physician) and Advanced Practice Providers (APPs- Physician Assistants and Nurse Practitioners) who all work together to provide you with the care you need, when you need it.   You may see any of the following providers on your designated Care Team at your next follow up: Dr Arvilla Meres Dr Carron Curie, NP Robbie Lis, [place] [facility] [place], [place] Karle Plumber, PharmD   Please be sure to bring in all your medications bottles to every appointment.   If you have any questions or concerns before your next appointment please send [place] a message through [place] or call our office at [phone].    TO LEAVE A MESSAGE FOR THE NURSE SELECT OPTION 2, PLEASE LEAVE A MESSAGE INCLUDING: YOUR NAME DATE OF BIRTH CALL BACK NUMBER REASON FOR CALL**this is important as we prioritize the call backs  YOU WILL RECEIVE A CALL BACK THE SAME DAY AS LONG AS YOU CALL BEFORE 4:00 PM

## 2021-08-08 NOTE — Progress Notes (Signed)
ReDS Vest / Clip - 08/08/21 1200       ReDS Vest / Clip   Station Marker C    Ruler Value 29    ReDS Value Range Low volume    ReDS Actual Value 35

## 2021-08-09 ENCOUNTER — Ambulatory Visit (INDEPENDENT_AMBULATORY_CARE_PROVIDER_SITE_OTHER): Payer: Medicaid Other | Admitting: *Deleted

## 2021-08-09 DIAGNOSIS — Z5181 Encounter for therapeutic drug level monitoring: Secondary | ICD-10-CM | POA: Diagnosis not present

## 2021-08-09 DIAGNOSIS — I4892 Unspecified atrial flutter: Secondary | ICD-10-CM

## 2021-08-09 DIAGNOSIS — Z952 Presence of prosthetic heart valve: Secondary | ICD-10-CM

## 2021-08-09 NOTE — Progress Notes (Signed)
Called pt and gave him INR results from yesterday.  Coumadin instructions given and INR appt made for 08/14/21.  Pt verbalized understanding.

## 2021-08-09 NOTE — Addendum Note (Signed)
Encounter addended by: Malen Gauze, RN on: 08/09/2021 8:42 AM  Actions taken: Clinical Note Signed

## 2021-08-09 NOTE — Patient Instructions (Signed)
Amiodarone decreased to 200mg  daily 08/05/21 Continue warfarin 1 1/2 tablets daily except 2 tablets on Mondays, Wednesdays and Fridays Continue greens/salads  Recheck on 08/14/21 Called pt with above instructions.  He verbalized understanding.

## 2021-08-13 ENCOUNTER — Other Ambulatory Visit (HOSPITAL_COMMUNITY): Payer: Self-pay

## 2021-08-14 ENCOUNTER — Ambulatory Visit (INDEPENDENT_AMBULATORY_CARE_PROVIDER_SITE_OTHER): Payer: Medicaid Other | Admitting: *Deleted

## 2021-08-14 DIAGNOSIS — Z5181 Encounter for therapeutic drug level monitoring: Secondary | ICD-10-CM

## 2021-08-14 DIAGNOSIS — Z952 Presence of prosthetic heart valve: Secondary | ICD-10-CM

## 2021-08-14 LAB — POCT INR: INR: 2.6 (ref 2.0–3.0)

## 2021-08-14 NOTE — Patient Instructions (Signed)
Amiodarone decreased to 200mg  daily 08/05/21 ?Continue warfarin 1 1/2 tablets daily except 2 tablets on Mondays, Wednesdays and Fridays ?Continue greens/salads  ?Recheck on 08/21/21 ?

## 2021-08-19 ENCOUNTER — Telehealth (HOSPITAL_COMMUNITY): Payer: Self-pay | Admitting: Cardiology

## 2021-08-19 MED ORDER — DIGOXIN 125 MCG PO TABS: 0.0625 mg | ORAL_TABLET | Freq: Every day | ORAL | 1 refills | Status: DC

## 2021-08-19 NOTE — Telephone Encounter (Signed)
-----   Message from Consuelo Pandy, [place] sent at 08/08/2021  4:41 PM EST ----- ?SCr and K stable. Dig level slightly elevated.  ? ?Reduce Digoxin to 0.0625 mg  ? ?INR is therapeutic. Continue current coumadin dose but needs to establish care at [facility] in [place], closer to home. Please arrange. Try to get in early next week.  ? ? ?

## 2021-08-19 NOTE — Telephone Encounter (Signed)
Patient called.  Patient aware. ? ? ?During phone call for lab results ? ?Pt reports he has experienced a headache for the past two days. Pt does not have a pcp and pain is not relived with tylenol. Advised I could have providers review medications for HA side effects in the meantime continue tylenol and needs to establish with PCP for further evaluation.  ?

## 2021-08-20 ENCOUNTER — Other Ambulatory Visit (HOSPITAL_COMMUNITY): Payer: Self-pay

## 2021-08-21 ENCOUNTER — Ambulatory Visit (INDEPENDENT_AMBULATORY_CARE_PROVIDER_SITE_OTHER): Payer: Medicaid Other | Admitting: *Deleted

## 2021-08-21 DIAGNOSIS — Z5181 Encounter for therapeutic drug level monitoring: Secondary | ICD-10-CM

## 2021-08-21 DIAGNOSIS — I4891 Unspecified atrial fibrillation: Secondary | ICD-10-CM | POA: Diagnosis not present

## 2021-08-21 DIAGNOSIS — Z952 Presence of prosthetic heart valve: Secondary | ICD-10-CM

## 2021-08-21 LAB — POCT INR: INR: 2.1 (ref 2.0–3.0)

## 2021-08-21 NOTE — Telephone Encounter (Signed)
There are no medications on his medication list that would obviously cause a headache as a side effect. However, his losartan was recently increased at his visit on 08/08/21 and his SBP at that visit was 112. If his BP is dropping low at home, that could be causing a headache. If this is the case, would be reasonable to decrease losartan back to 12.5 mg daily.

## 2021-08-21 NOTE — Telephone Encounter (Signed)
Attempted to reach pt by phone. No answer and vm full.  ?

## 2021-08-21 NOTE — Patient Instructions (Signed)
Amiodarone decreased to 200mg  daily 08/05/21 ?Increase warfarin to 2 tablets daily ?Continue greens/salads  ?Recheck on 09/02/21 ?

## 2021-08-21 NOTE — Telephone Encounter (Signed)
Pt left vm c/o headache. Pt said tylenol does not work. Pt took a covid test and it was negative. Pt thinks his medications are causing the headache. Pt spoke with Chantel Jeffries,CMA on 3/6 "Pt reports he has experienced a headache for the past two days. Pt does not have a pcp and pain is not relived with tylenol. Advised I could have providers review medications for HA side effects in the meantime continue tylenol and needs to establish with PCP for further evaluation. ". She routed the message to Lauren Kemp,RPH and Melany Guernsey I do not see any notes after that.   ? ?Routing message to [employer] and Shanda Bumps [place],FNP  ?

## 2021-08-22 ENCOUNTER — Other Ambulatory Visit (HOSPITAL_COMMUNITY): Payer: Self-pay

## 2021-08-22 ENCOUNTER — Telehealth (HOSPITAL_COMMUNITY): Payer: Self-pay | Admitting: Pharmacy Technician

## 2021-08-22 NOTE — Telephone Encounter (Signed)
Advanced Heart Failure Patient Advocate Encounter ? ?Prior Authorization for Marcelline Deist has been submitted and approved.   ? ?PA# [id number] ?Effective dates: 08/22/21 through 08/22/22 ? ?Archer Asa, CPhT ? ? ?

## 2021-08-23 MED ORDER — LOSARTAN POTASSIUM 25 MG PO TABS: 12.5000 mg | ORAL_TABLET | Freq: Every day | ORAL | 1 refills | Status: DC

## 2021-08-23 NOTE — Telephone Encounter (Signed)
Spoke with pt he is aware. Losartan decreased to 12.5 daily. ?

## 2021-08-27 NOTE — Progress Notes (Addendum)
?Advanced Heart Failure Clinic Note  ? ?PCP: Patient, No Pcp Per (Inactive) ?PCP-Cardiologist: Carlyle Dolly, MD  ?[facility]: Dr. Haroldine Laws  ? ?HPI: ?Donald [name] is a 36 y.o. male with history of mitral regurgitation (s/p MV repair with resection of ruptured anterior papillary muscle and reconstruction of papillary chord and placement of annuloplasty ring in 2019). 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  ?  ?The patient has been followed by Dr. Harl Bowie. Patient has had multiple episodes of acute exacerbation of fluid overload in 2020 and 2021 with at least one ER visit for acute pulmonary edema although he did not require hospitalization. Echo 11/21 EF 45% with  severe MR. TEE. 12/21 EF 40 to 45% with severe MR with multiple jets including a possible paravalvular leak, severe TR. The RV was moderate to severely HK with evidence of significant PH He has had several episodes of AFL with RVR, requiring DCCVs.  ?  ?He saw Dr. Roxy Manns in 2/22 for consideration of repeat MV surgery and subsequently referred to the [facility] for CHF evaluation/ optimization and RHC prior to potential re-do MVR. Saw Dr. Haroldine Laws for consultation and set up for [facility] 09/11/20 which showed normal coronaries, NICM EF 30-35%, severe MR w/ prominent v-waves in PCWP tracing, mild pulmonary venous HTN with normal CO (Fick cardiac output/index = 5.3/2.5). ?  ?He cancelled f/u with Dr. Roxy Manns and Dr. Benson Norway in 4/22. ?  ?Admitted 12/22 for a/c CHF and evidence of low output and AKI. Echo with EF 20-25% with severe MR/mod MS and severe RV dysfunction. PICC place, started on empiric milrinone. AKI resolved with addition of milrinone. Dr Haroldine Laws reviewed case  with [facility] regarding MVR but he was felt to be too high-risk and they recommended transfer to [facility]. He underwent MVR w/ mechanical valve on 12/26 by Dr. Wannetta Sender at [facility]. He was discharged on 1/30 on warfarin. ?  ?Following d/c from [place], he struggled w/ CHF symptoms and volume overload. Went to ED at [facility] on 07/22/21 with SOB and noted to be fluid overloaded, given IV Lasix and discharged w/ instructions to f/u w/ cardiology. Went back to ED on 2/7 at [facility] w/ recurrent dyspnea, CP and palpitations and noted to be in Afib w/ RVR and fluid overloaded. Started on amio gtt. Later complicated by AMS/confusion, hypoglycemia and hypoxic respiratory failure. Required intubation. Transferred to [facility] for further management. Echo showed LVEF <20%, RV severely reduced. Mechanical MV ok, mGradient 3 mmHg. Decompensation felt 2/2 being back in rapid afib. Started on milrinone and amio gtt. Diuresed w/ IV Lasix. Diuresed and extubated. Converted to NSR, amio and milrinone weaned off. GDMT titrated. Discharged home, weight was 198 lb. Plan is to follow closely. Depending on his progress, may need to consider VAD/transplant down the road if candidate. ? ?Follow up 2/23 doing well symptomatically. Volume mildly up, REDs 35%, instructed to take extra dose of Lasix and increase losartan. Referred to Coumadin Clinic. ? ?Today he returns for HF follow up. Overall feeling fine. He has no SOB with activity or ADLs. Denies abnormal bleeding, palpitaitons, CP, dizziness, edema, or PND/Orthopnea. Appetite ok. No fever or chills. Weight at home 190 pounds. Taking all medications. No further tobacco use since 12/22. ? ? ?Cardiac Studies: ?- Echo (2/23):  EF < 20%, severe LV dysfunction with global HK, grade II DD, RV severely reduced, S/p mitral valve repair. MV mean gradient 3 mmHg  ? ?- Echo (12/22): EF 20-25%, severe LV dysfunction with global HK, mild LVH, RV  moderately reduced, elevated MV gradient 45mmHg, mild AI ? ?-  R/LHC (09/11/20): ?  ?RA = 7 ?RV = 49/10 ?PA = 48/16 (32) ?PCW = 17 (v=32) ?Fick cardiac output/index = 5.3/2.5 ?PVR = 2.9 WU ?Ao sat = 99% ?PA sat = 71%, 70% ?High SVC sat =  75% ? ?Assessment: ? ?1. Normal coronary arteries ?2. NICM EF 30-35% ?3. Severe MR with prominent v-waves in PCWP tracing ?4. Mild pulmonary venous HTN with normal CO ? ?Past Medical History:  ?Diagnosis Date  ? Anemia   ? Autoimmune disorder ([place])   ? pyoderma gangrenosum  ? CHF (congestive heart failure) ([place])   ? Chronic systolic heart failure ([place])   ? a. EF 35-40% by echo in 07/2018 b. EF at 45% by repeat echo in 04/2020  ? DVT (deep venous thrombosis) ([place])   ? h/o  ? Dysrhythmia   ? Mitral regurgitation   ? a. 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 b. 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  ? Mitral stenosis   ? Myocardial infarction [facility])   ? Paroxysmal atrial flutter ([place])   ? Pyoderma gangrenosa   ? Seronegative spondylitis ([place])   ? arthritis  ? Tricuspid regurgitation   ? ? ?Current Outpatient Medications  ?Medication Sig Dispense Refill  ? acetaminophen (TYLENOL) 325 MG tablet Take 2 tablets (650 mg total) by mouth every 4 (four) hours as needed for headache or mild pain.    ? amiodarone (PACERONE) 200 MG tablet Take 1 tablet (200 mg total) by mouth daily. 30 tablet 1  ? dapagliflozin propanediol (FARXIGA) 10 MG TABS tablet Take 1 tablet (10 mg total) by mouth daily. 30 tablet 1  ? digoxin (LANOXIN) 0.125 MG tablet Take 0.5 tablets (0.0625 mg total) by mouth daily. 15 tablet 1  ? furosemide (LASIX) 40 MG tablet Take 1 tablet (40 mg total) by mouth daily. 30 tablet 1  ? losartan (COZAAR) 25 MG tablet Take 0.5 tablets (12.5 mg total) by mouth daily. 30 tablet 1  ? magnesium oxide (MAG-OX) 400 (240 Mg) MG tablet Take 200 mg by mouth 2 (two) times daily.    ? pantoprazole (PROTONIX) 40 MG tablet Take 1 tablet (40 mg total) by mouth daily. 30  tablet 2  ? potassium chloride SA (KLOR-CON M) 20 MEQ tablet Take 2 tablets (40 mEq total) by mouth daily. 60 tablet 1  ? spironolactone (ALDACTONE) 25 MG tablet Take 1 tablet (25 mg total) by mouth daily. 30 tablet 1  ? warfarin (COUMADIN) 2 MG tablet Patient takes 2 tablets by mouth daily.    ? ?No current facility-administered medications for this encounter.  ? ? ?No Known Allergies ? ?  ?Social History  ? ?Socioeconomic History  ? Marital status: Divorced  ?  Spouse name: Not on file  ? Number of children: Not on file  ? Years of education: Not on file  ? Highest education level: Not on file  ?Occupational History  ? Not on file  ?Tobacco Use  ? Smoking status: Former  ?  Types: Cigarettes  ?  Quit date: 10/14/2020  ?  Years since quitting: 0.8  ? Smokeless tobacco: Never  ?Vaping Use  ?  Vaping Use: Never used  ?Substance and Sexual Activity  ? Alcohol use: No  ? Drug use: No  ? Sexual activity: Yes  ?Other Topics Concern  ? Not on file  ?Social History Narrative  ? Not on file  ? ?Social Determinants of Health  ? ?Financial Resource Strain: Not on file  ?Food Insecurity: Not on file  ?Transportation Needs: Not on file  ?Physical Activity: Not on file  ?Stress: Not on file  ?Social Connections: Not on file  ?Intimate Partner Violence: Not on file  ? ?Family History  ?Problem Relation Age of Onset  ? Multiple sclerosis Mother   ? Psoriasis Mother   ? Depression Father   ? Diabetes Father   ? Diabetes Paternal Grandmother   ? ?BP 104/80   Pulse 77   Wt 88.1 kg (194 lb 3.2 oz)   SpO2 99%   BMI 28.68 kg/m?  ? ?Wt Readings from Last 3 Encounters:  ?08/30/21 88.1 kg (194 lb 3.2 oz)  ?08/08/21 93 kg (205 lb)  ?08/03/21 90 kg (198 lb 6.6 oz)  ? ?PHYSICAL EXAM: ?General:  NAD. No resp difficulty ?HEENT: Normal ?Neck: Supple. No JVD. Carotids 2+ bilat; no bruits. No lymphadenopathy or thryomegaly appreciated. ?Cor: PMI nondisplaced. Regular rate & rhythm. No rubs, gallops or murmurs. ?Lungs: Clear ?Abdomen: Soft,  nontender, nondistended. No hepatosplenomegaly. No bruits or masses. Good bowel sounds. ?Extremities: No cyanosis, clubbing, rash, edema ?Neuro: Alert & oriented x 3, cranial nerves grossly intact. Moves all 4 extre

## 2021-08-29 ENCOUNTER — Telehealth (HOSPITAL_COMMUNITY): Payer: Self-pay

## 2021-08-29 NOTE — Telephone Encounter (Signed)
Called and was unable to leave patient a voice mail to confirm/remind patient of their appointment at the [facility] on 08/30/21.  ? ?

## 2021-08-30 ENCOUNTER — Other Ambulatory Visit: Payer: Self-pay

## 2021-08-30 ENCOUNTER — Encounter (HOSPITAL_COMMUNITY): Payer: Self-pay

## 2021-08-30 ENCOUNTER — Ambulatory Visit (HOSPITAL_COMMUNITY)
Admission: RE | Admit: 2021-08-30 | Discharge: 2021-08-30 | Disposition: A | Discharge status: Home / Self Care | Payer: Medicaid Other | Source: Ambulatory Visit | Attending: Family Medicine | Admitting: Family Medicine

## 2021-08-30 VITALS — BP 104/80 | HR 77 | Wt 194.2 lb

## 2021-08-30 DIAGNOSIS — I480 Paroxysmal atrial fibrillation: Secondary | ICD-10-CM | POA: Diagnosis not present

## 2021-08-30 DIAGNOSIS — I052 Rheumatic mitral stenosis with insufficiency: Secondary | ICD-10-CM | POA: Diagnosis not present

## 2021-08-30 DIAGNOSIS — R9431 Abnormal electrocardiogram [ECG] [EKG]: Secondary | ICD-10-CM | POA: Diagnosis not present

## 2021-08-30 DIAGNOSIS — Z87891 Personal history of nicotine dependence: Secondary | ICD-10-CM | POA: Diagnosis not present

## 2021-08-30 DIAGNOSIS — D8989 Other specified disorders involving the immune mechanism, not elsewhere classified: Secondary | ICD-10-CM | POA: Insufficient documentation

## 2021-08-30 DIAGNOSIS — Z79899 Other long term (current) drug therapy: Secondary | ICD-10-CM | POA: Insufficient documentation

## 2021-08-30 DIAGNOSIS — M4680 Other specified inflammatory spondylopathies, site unspecified: Secondary | ICD-10-CM | POA: Diagnosis not present

## 2021-08-30 DIAGNOSIS — Z7901 Long term (current) use of anticoagulants: Secondary | ICD-10-CM | POA: Insufficient documentation

## 2021-08-30 DIAGNOSIS — I482 Chronic atrial fibrillation, unspecified: Secondary | ICD-10-CM | POA: Insufficient documentation

## 2021-08-30 DIAGNOSIS — L88 Pyoderma gangrenosum: Secondary | ICD-10-CM | POA: Insufficient documentation

## 2021-08-30 DIAGNOSIS — I110 Hypertensive heart disease with heart failure: Secondary | ICD-10-CM | POA: Insufficient documentation

## 2021-08-30 DIAGNOSIS — I445 Left posterior fascicular block: Secondary | ICD-10-CM | POA: Insufficient documentation

## 2021-08-30 DIAGNOSIS — Z86718 Personal history of other venous thrombosis and embolism: Secondary | ICD-10-CM | POA: Insufficient documentation

## 2021-08-30 DIAGNOSIS — I440 Atrioventricular block, first degree: Secondary | ICD-10-CM | POA: Insufficient documentation

## 2021-08-30 DIAGNOSIS — I5022 Chronic systolic (congestive) heart failure: Secondary | ICD-10-CM | POA: Insufficient documentation

## 2021-08-30 DIAGNOSIS — I491 Atrial premature depolarization: Secondary | ICD-10-CM | POA: Diagnosis not present

## 2021-08-30 DIAGNOSIS — K529 Noninfective gastroenteritis and colitis, unspecified: Secondary | ICD-10-CM | POA: Insufficient documentation

## 2021-08-30 DIAGNOSIS — Z7984 Long term (current) use of oral hypoglycemic drugs: Secondary | ICD-10-CM | POA: Insufficient documentation

## 2021-08-30 DIAGNOSIS — I4892 Unspecified atrial flutter: Secondary | ICD-10-CM | POA: Diagnosis not present

## 2021-08-30 DIAGNOSIS — I5082 Biventricular heart failure: Secondary | ICD-10-CM | POA: Insufficient documentation

## 2021-08-30 DIAGNOSIS — Z952 Presence of prosthetic heart valve: Secondary | ICD-10-CM | POA: Insufficient documentation

## 2021-08-30 DIAGNOSIS — I428 Other cardiomyopathies: Secondary | ICD-10-CM | POA: Diagnosis not present

## 2021-08-30 DIAGNOSIS — I272 Pulmonary hypertension, unspecified: Secondary | ICD-10-CM | POA: Insufficient documentation

## 2021-08-30 LAB — COMPREHENSIVE METABOLIC PANEL
ALT: 25 U/L (ref 0–44)
AST: 24 U/L (ref 15–41)
Albumin: 3.2 g/dL — ABNORMAL LOW (ref 3.5–5.0)
Alkaline Phosphatase: 118 U/L (ref 38–126)
Anion gap: 9 (ref 5–15)
BUN: 17 mg/dL (ref 6–20)
CO2: 28 mmol/L (ref 22–32)
Calcium: 9.5 mg/dL (ref 8.9–10.3)
Chloride: 96 mmol/L — ABNORMAL LOW (ref 98–111)
Creatinine, Ser: 1.06 mg/dL (ref 0.61–1.24)
GFR, Estimated: >60 mL/min (ref >60)
Glucose, Bld: 88 mg/dL (ref 70–99)
Potassium: 4.4 mmol/L (ref 3.5–5.1)
Sodium: 133 mmol/L — ABNORMAL LOW (ref 135–145)
Total Bilirubin: 0.7 mg/dL (ref 0.3–1.2)
Total Protein: 9.8 g/dL — ABNORMAL HIGH (ref 6.5–8.1)

## 2021-08-30 LAB — DIGOXIN LEVEL: Digoxin Level: 0.9 ng/mL (ref 0.8–2.0)

## 2021-08-30 MED ORDER — AMIODARONE HCL 200 MG PO TABS: 100.0000 mg | ORAL_TABLET | Freq: Every day | ORAL | 1 refills | Status: DC

## 2021-08-30 NOTE — Patient Instructions (Signed)
Decrease Amiodarone to 100 mg (1/2 tab) Daily ? ?Labs done today, we will call you for abnormal results ? ?Your physician recommends that you schedule a follow-up appointment in: AS SCHEDULED on 10/11/21 ? ?Do the following things EVERYDAY: ?Weigh yourself in the morning before breakfast. Write it down and keep it in a log. ?Take your medicines as prescribed ?Eat low salt foods--Limit salt (sodium) to 2000 mg per day.  ?Stay as active as you can everyday ?Limit all fluids for the day to less than 2 liters ? ?If you have any questions or concerns before your next appointment please send [place] a message through [place] or call our office at [phone].   ? ?TO LEAVE A MESSAGE FOR THE NURSE SELECT OPTION 2, PLEASE LEAVE A MESSAGE INCLUDING: ?YOUR NAME ?DATE OF BIRTH ?CALL BACK NUMBER ?REASON FOR CALL**this is important as we prioritize the call backs ? ?YOU WILL RECEIVE A CALL BACK THE SAME DAY AS LONG AS YOU CALL BEFORE 4:00 PM ? ?At the [facility], you and your health needs are our priority. As part of our continuing mission to provide you with exceptional heart care, we have created designated Provider Care Teams. These Care Teams include your primary Cardiologist (physician) and Advanced Practice Providers (APPs- Physician Assistants and Nurse Practitioners) who all work together to provide you with the care you need, when you need it.  ? ?You may see any of the following providers on your designated Care Team at your next follow up: ?Dr Glori Bickers ?Dr Loralie Champagne ?Darrick Grinder, NP ?Lyda Jester, PA ?Jessica Milford,NP ?Marlyce Huge, PA ?Audry Riles, PharmD ? ? ?Please be sure to bring in all your medications bottles to every appointment.  ? ? ?

## 2021-09-02 ENCOUNTER — Ambulatory Visit (INDEPENDENT_AMBULATORY_CARE_PROVIDER_SITE_OTHER): Payer: Medicaid Other | Admitting: *Deleted

## 2021-09-02 ENCOUNTER — Other Ambulatory Visit (HOSPITAL_COMMUNITY): Payer: Self-pay

## 2021-09-02 DIAGNOSIS — Z952 Presence of prosthetic heart valve: Secondary | ICD-10-CM

## 2021-09-02 DIAGNOSIS — Z5181 Encounter for therapeutic drug level monitoring: Secondary | ICD-10-CM

## 2021-09-02 LAB — POCT INR: INR: 3.6 — AB (ref 2.0–3.0)

## 2021-09-02 NOTE — Patient Instructions (Signed)
Amiodarone decreased to 200mg  daily 08/05/21 ?Decrease warfarin to 2 tablets daily except 1 tablet on Mondays ?Continue greens/salads  ?Recheck on 09/16/21 ?

## 2021-09-06 ENCOUNTER — Telehealth (HOSPITAL_COMMUNITY): Payer: Self-pay

## 2021-09-06 NOTE — Telephone Encounter (Addendum)
Pt aware, agreeable, and verbalized understanding ? ?Sending hard copy order to patient to get labs draw up in [place] next week  ? ?----- Message from Rafael Bihari, FNP sent at 09/02/2021  7:50 AM EDT ----- ?Digoxin level elevated. Please repeat dig level as a trough. ?

## 2021-09-16 ENCOUNTER — Ambulatory Visit (INDEPENDENT_AMBULATORY_CARE_PROVIDER_SITE_OTHER): Payer: Medicaid Other | Admitting: *Deleted

## 2021-09-16 DIAGNOSIS — Z5181 Encounter for therapeutic drug level monitoring: Secondary | ICD-10-CM | POA: Diagnosis not present

## 2021-09-16 DIAGNOSIS — Z952 Presence of prosthetic heart valve: Secondary | ICD-10-CM

## 2021-09-16 LAB — POCT INR: INR: 5.2 — AB (ref 2.0–3.0)

## 2021-09-16 NOTE — Patient Instructions (Signed)
Amiodarone decreased to 200mg  daily 08/05/21 ?Hold warfarin tonight and tomorrow night then decrease dose to 2 tablets daily except 1 tablet on Mondays, Wednesdays and Fridays ?Continue greens/salads  ?Denies any sign of bleeding.  Bleeding and fall precautions discussed with pt. ?Recheck on 09/16/21 ?

## 2021-09-23 ENCOUNTER — Ambulatory Visit (INDEPENDENT_AMBULATORY_CARE_PROVIDER_SITE_OTHER): Payer: Medicaid Other | Admitting: *Deleted

## 2021-09-23 DIAGNOSIS — Z5181 Encounter for therapeutic drug level monitoring: Secondary | ICD-10-CM

## 2021-09-23 DIAGNOSIS — I4891 Unspecified atrial fibrillation: Secondary | ICD-10-CM

## 2021-09-23 DIAGNOSIS — Z952 Presence of prosthetic heart valve: Secondary | ICD-10-CM | POA: Diagnosis not present

## 2021-09-23 LAB — POCT INR: INR: 3.2 — AB (ref 2.0–3.0)

## 2021-09-23 NOTE — Patient Instructions (Signed)
Amiodarone decreased to 200mg  daily 08/05/21 ?Continue warfarin 2 tablets daily except 1 tablet on Mondays, Wednesdays and Fridays ?Continue greens/salads  ?Recheck in 2 wks ?

## 2021-09-30 ENCOUNTER — Other Ambulatory Visit (HOSPITAL_COMMUNITY): Payer: Self-pay

## 2021-10-01 ENCOUNTER — Other Ambulatory Visit (HOSPITAL_COMMUNITY): Payer: Self-pay

## 2021-10-01 MED ORDER — LOSARTAN POTASSIUM 25 MG PO TABS: 12.5000 mg | ORAL_TABLET | Freq: Every day | ORAL | 1 refills | Status: DC

## 2021-10-04 ENCOUNTER — Other Ambulatory Visit (HOSPITAL_COMMUNITY): Payer: Self-pay

## 2021-10-08 ENCOUNTER — Ambulatory Visit (INDEPENDENT_AMBULATORY_CARE_PROVIDER_SITE_OTHER): Payer: Medicaid Other | Admitting: *Deleted

## 2021-10-08 ENCOUNTER — Other Ambulatory Visit: Payer: Self-pay | Admitting: Physician Assistant

## 2021-10-08 DIAGNOSIS — Z5181 Encounter for therapeutic drug level monitoring: Secondary | ICD-10-CM | POA: Diagnosis not present

## 2021-10-08 DIAGNOSIS — Z952 Presence of prosthetic heart valve: Secondary | ICD-10-CM

## 2021-10-08 LAB — POCT INR: INR: 1.3 — AB (ref 2.0–3.0)

## 2021-10-08 MED ORDER — WARFARIN SODIUM 2 MG PO TABS: ORAL_TABLET | ORAL | 4 refills | Status: DC

## 2021-10-08 MED ORDER — DAPAGLIFLOZIN PROPANEDIOL 10 MG PO TABS: 10.0000 mg | ORAL_TABLET | Freq: Every day | ORAL | 1 refills | Status: DC

## 2021-10-08 NOTE — Patient Instructions (Signed)
Amiodarone decreased to 200mg  daily 08/05/21 ?Take warfarin 3 tablets tonight and tomorrow night then resume 2 tablets daily except 1 tablet on Mondays, Wednesdays and Fridays ?Continue greens/salads  ?Recheck in 10 days ?

## 2021-10-09 ENCOUNTER — Other Ambulatory Visit (HOSPITAL_COMMUNITY): Payer: Self-pay

## 2021-10-09 ENCOUNTER — Other Ambulatory Visit (HOSPITAL_COMMUNITY): Payer: Self-pay | Admitting: Cardiology

## 2021-10-09 ENCOUNTER — Other Ambulatory Visit (HOSPITAL_COMMUNITY): Payer: Self-pay | Admitting: *Deleted

## 2021-10-09 MED ORDER — DAPAGLIFLOZIN PROPANEDIOL 10 MG PO TABS: 10.0000 mg | ORAL_TABLET | Freq: Every day | ORAL | 3 refills | Status: DC

## 2021-10-09 MED ORDER — POTASSIUM CHLORIDE CRYS ER 20 MEQ PO TBCR
40.0000 meq | EXTENDED_RELEASE_TABLET | Freq: Every day | ORAL | 1 refills | Status: DC
  Filled 2021-10-09: qty 60, 30d supply, fill #0

## 2021-10-11 ENCOUNTER — Other Ambulatory Visit (HOSPITAL_COMMUNITY): Payer: Self-pay

## 2021-10-11 ENCOUNTER — Encounter (HOSPITAL_COMMUNITY): Payer: Self-pay | Admitting: Internal Medicine

## 2021-10-11 ENCOUNTER — Ambulatory Visit (HOSPITAL_BASED_OUTPATIENT_CLINIC_OR_DEPARTMENT_OTHER)
Admission: RE | Admit: 2021-10-11 | Discharge: 2021-10-11 | Disposition: A | Discharge status: Home / Self Care | Payer: Medicaid Other | Source: Ambulatory Visit | Attending: Internal Medicine | Admitting: Internal Medicine

## 2021-10-11 ENCOUNTER — Ambulatory Visit (HOSPITAL_COMMUNITY)
Admission: RE | Admit: 2021-10-11 | Discharge: 2021-10-11 | Disposition: A | Discharge status: Home / Self Care | Payer: Medicaid Other | Source: Ambulatory Visit | Attending: Internal Medicine | Admitting: Internal Medicine

## 2021-10-11 VITALS — BP 118/78 | HR 64 | Wt 200.8 lb

## 2021-10-11 DIAGNOSIS — I110 Hypertensive heart disease with heart failure: Secondary | ICD-10-CM | POA: Diagnosis not present

## 2021-10-11 DIAGNOSIS — I5042 Chronic combined systolic (congestive) and diastolic (congestive) heart failure: Secondary | ICD-10-CM

## 2021-10-11 DIAGNOSIS — I517 Cardiomegaly: Secondary | ICD-10-CM

## 2021-10-11 DIAGNOSIS — I5022 Chronic systolic (congestive) heart failure: Secondary | ICD-10-CM

## 2021-10-11 DIAGNOSIS — I088 Other rheumatic multiple valve diseases: Secondary | ICD-10-CM

## 2021-10-11 DIAGNOSIS — Z7984 Long term (current) use of oral hypoglycemic drugs: Secondary | ICD-10-CM | POA: Diagnosis not present

## 2021-10-11 DIAGNOSIS — Z87891 Personal history of nicotine dependence: Secondary | ICD-10-CM | POA: Insufficient documentation

## 2021-10-11 DIAGNOSIS — N179 Acute kidney failure, unspecified: Secondary | ICD-10-CM | POA: Insufficient documentation

## 2021-10-11 DIAGNOSIS — I5082 Biventricular heart failure: Secondary | ICD-10-CM | POA: Insufficient documentation

## 2021-10-11 DIAGNOSIS — Z952 Presence of prosthetic heart valve: Secondary | ICD-10-CM | POA: Insufficient documentation

## 2021-10-11 DIAGNOSIS — Z79899 Other long term (current) drug therapy: Secondary | ICD-10-CM | POA: Insufficient documentation

## 2021-10-11 DIAGNOSIS — I272 Pulmonary hypertension, unspecified: Secondary | ICD-10-CM | POA: Diagnosis not present

## 2021-10-11 DIAGNOSIS — I428 Other cardiomyopathies: Secondary | ICD-10-CM | POA: Insufficient documentation

## 2021-10-11 DIAGNOSIS — I4891 Unspecified atrial fibrillation: Secondary | ICD-10-CM

## 2021-10-11 DIAGNOSIS — I081 Rheumatic disorders of both mitral and tricuspid valves: Secondary | ICD-10-CM | POA: Diagnosis not present

## 2021-10-11 DIAGNOSIS — I5189 Other ill-defined heart diseases: Secondary | ICD-10-CM | POA: Diagnosis not present

## 2021-10-11 DIAGNOSIS — Z7901 Long term (current) use of anticoagulants: Secondary | ICD-10-CM | POA: Insufficient documentation

## 2021-10-11 DIAGNOSIS — I480 Paroxysmal atrial fibrillation: Secondary | ICD-10-CM | POA: Diagnosis not present

## 2021-10-11 LAB — COMPREHENSIVE METABOLIC PANEL
ALT: 19 U/L (ref 0–44)
AST: 33 U/L (ref 15–41)
Albumin: 2.9 g/dL — ABNORMAL LOW (ref 3.5–5.0)
Alkaline Phosphatase: 114 U/L (ref 38–126)
Anion gap: 5 (ref 5–15)
BUN: 9 mg/dL (ref 6–20)
CO2: 28 mmol/L (ref 22–32)
Calcium: 9.1 mg/dL (ref 8.9–10.3)
Chloride: 103 mmol/L (ref 98–111)
Creatinine, Ser: 0.8 mg/dL (ref 0.61–1.24)
GFR, Estimated: >60 mL/min (ref >60)
Glucose, Bld: 90 mg/dL (ref 70–99)
Potassium: 3.6 mmol/L (ref 3.5–5.1)
Sodium: 136 mmol/L (ref 135–145)
Total Bilirubin: 0.3 mg/dL (ref 0.3–1.2)
Total Protein: 9.1 g/dL — ABNORMAL HIGH (ref 6.5–8.1)

## 2021-10-11 LAB — CBC
HCT: 35.6 % — ABNORMAL LOW (ref 39.0–52.0)
Hemoglobin: 11.4 g/dL — ABNORMAL LOW (ref 13.0–17.0)
MCH: 25.2 pg — ABNORMAL LOW (ref 26.0–34.0)
MCHC: 32 g/dL (ref 30.0–36.0)
MCV: 78.6 fL — ABNORMAL LOW (ref 80.0–100.0)
Platelets: 374 10*3/uL (ref 150–400)
RBC: 4.53 MIL/uL (ref 4.22–5.81)
RDW: 22.6 % — ABNORMAL HIGH (ref 11.5–15.5)
WBC: 6.5 10*3/uL (ref 4.0–10.5)
nRBC: 0 % (ref 0.0–0.2)

## 2021-10-11 LAB — ECHOCARDIOGRAM COMPLETE
Area-P 1/2: 2.29 cm2
Calc EF: 20.4 %
MV VTI: 2.08 cm2
S' Lateral: 6 cm
Single Plane A2C EF: 17.1 %
Single Plane A4C EF: 28.7 %

## 2021-10-11 LAB — BRAIN NATRIURETIC PEPTIDE: B Natriuretic Peptide: 400.8 pg/mL — ABNORMAL HIGH (ref 0.0–100.0)

## 2021-10-11 LAB — PROTIME-INR
INR: 1.4 — ABNORMAL HIGH (ref 0.8–1.2)
Prothrombin Time: 16.9 seconds — ABNORMAL HIGH (ref 11.4–15.2)

## 2021-10-11 MED ORDER — LOSARTAN POTASSIUM 25 MG PO TABS: 25.0000 mg | ORAL_TABLET | Freq: Every day | ORAL | 3 refills | Status: DC

## 2021-10-11 MED ORDER — POTASSIUM CHLORIDE CRYS ER 20 MEQ PO TBCR: 40.0000 meq | EXTENDED_RELEASE_TABLET | Freq: Every day | ORAL | 3 refills | Status: DC

## 2021-10-11 NOTE — Progress Notes (Signed)
?  Echocardiogram ?2D Echocardiogram has been performed. ? ?Donald [name] ?10/11/2021, 10:51 AM ?

## 2021-10-11 NOTE — Progress Notes (Signed)
?Advanced Heart Failure Clinic Note  ? ?PCP: Patient, No Pcp Per (Inactive) ?PCP-Cardiologist: Carlyle Dolly, MD  ?[facility]: Dr. Haroldine Laws  ? ?HPI: ?Donald [name] is a 36 y.o. male with history of mitral regurgitation (s/p MV repair with resection of ruptured anterior papillary muscle and reconstruction of papillary chord and placement of annuloplasty ring in 2019). Had recurrent MR and underwent MVR w/ mechanical valve on 06/10/21 by Dr. Wannetta Sender at [facility]. ?  ?Admitted 12/22 for a/c CHF and evidence of low output and AKI. Echo with EF 20-25% with severe MR/mod MS and severe RV dysfunction. PICC place, started on empiric milrinone. AKI resolved with addition of milrinone. He underwent MVR w/ mechanical valve on 12/26 by Dr. Wannetta Sender at [facility]. He was discharged on 1/30 on warfarin. ? ?Admitted 2/23 with low output HF in setting of AF. Echo showed LVEF <20%, RV severely reduced. Mechanical MV ok, Gradient 3 mmHg. Started on milrinone and amio gtt. Diuresed w/ IV Lasix. Diuresed and extubated. Converted to NSR, amio and milrinone weaned off. GDMT titrated. Discharged home, weight was 198 lb.  ? ?Today he returns for HF follow up. Feels good. Remains active keeping after his 2y/o son. No SOB, orthopnea or PND. Taking lasix 40 daily. Really knows his meds well.  ? ?Echo today 10/11/21: EF 25%. Mild RV dysfunction ? ? ?Cardiac Studies: ?- Echo (2/23): EF < 20%, severe LV dysfunction with global HK, grade II DD, RV severely reduced, S/p mitral valve repair. MV mean gradient 3 mmHg  ? ?- Echo (12/22): EF 20-25%, severe LV dysfunction with global HK, mild LVH, RV  moderately reduced, elevated MV gradient 88mmHg, mild AI ? ?- R/LHC (09/11/20): ?  ?RA = 7 ?RV = 49/10 ?PA = 48/16 (32) ?PCW = 17 (v=32) ?Fick cardiac output/index = 5.3/2.5 ?PVR = 2.9 WU ?Ao sat = 99% ?PA sat = 71%, 70% ?High SVC sat =  75% ? ?Assessment: ? ?1. Normal coronary arteries ?2. NICM EF 30-35% ?3. Severe MR with prominent v-waves in PCWP  tracing ?4. Mild pulmonary venous HTN with normal CO ? ?Past Medical History:  ?Diagnosis Date  ? Anemia   ? Autoimmune disorder ([place])   ? pyoderma gangrenosum  ? CHF (congestive heart failure) ([place])   ? Chronic systolic heart failure ([place])   ? a. EF 35-40% by echo in 07/2018 b. EF at 45% by repeat echo in 04/2020  ? DVT (deep venous thrombosis) ([place])   ? h/o  ? Dysrhythmia   ? Mitral regurgitation   ? a. 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 b. 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  ? Mitral stenosis   ? Myocardial infarction [facility])   ? Paroxysmal atrial flutter ([place])   ? Pyoderma gangrenosa   ? Seronegative spondylitis ([place])   ? arthritis  ? Tricuspid regurgitation   ? ? ?Current Outpatient Medications  ?Medication Sig Dispense Refill  ? acetaminophen (TYLENOL) 325 MG tablet Take 2 tablets (650 mg total) by mouth every 4 (four) hours as needed for headache or mild pain.    ? amiodarone (PACERONE) 200 MG tablet Take 0.5 tablets (100 mg total) by mouth daily. 30 tablet 1  ? dapagliflozin propanediol (FARXIGA) 10 MG TABS tablet Take 1 tablet (10 mg total) by mouth daily. 30 tablet 3  ? digoxin (LANOXIN) 0.125 MG tablet Take 0.5 tablets (0.0625 mg total) by mouth daily. 15 tablet 1  ? furosemide (LASIX)  40 MG tablet Take 1 tablet (40 mg total) by mouth daily. 30 tablet 1  ? losartan (COZAAR) 25 MG tablet Take 0.5 tablets (12.5 mg total) by mouth daily. 30 tablet 1  ? pantoprazole (PROTONIX) 40 MG tablet Take 1 tablet (40 mg total) by mouth daily. 30 tablet 2  ? potassium chloride SA (KLOR-CON M) 20 MEQ tablet Take 2 tablets (40 mEq total) by mouth daily. 60 tablet 1  ? spironolactone (ALDACTONE) 25 MG tablet Take 1 tablet (25 mg total) by mouth daily. 30 tablet 1  ? warfarin (COUMADIN) 2 MG tablet Take 1 - 2 tablets daily or as directed by coumadin clinic 30 tablet 4  ? ?No current facility-administered medications for this  encounter.  ? ? ?No Known Allergies ? ?  ?Social History  ? ?Socioeconomic History  ? Marital status: Divorced  ?  Spouse name: Not on file  ? Number of children: Not on file  ? Years of education: Not on file  ? Highest education level: Not on file  ?Occupational History  ? Not on file  ?Tobacco Use  ? Smoking status: Former  ?  Types: Cigarettes  ?  Quit date: 10/14/2020  ?  Years since quitting: 0.9  ? Smokeless tobacco: Never  ?Vaping Use  ? Vaping Use: Never used  ?Substance and Sexual Activity  ? Alcohol use: No  ? Drug use: No  ? Sexual activity: Yes  ?Other Topics Concern  ? Not on file  ?Social History Narrative  ? Not on file  ? ?Social Determinants of Health  ? ?Financial Resource Strain: Not on file  ?Food Insecurity: Not on file  ?Transportation Needs: Not on file  ?Physical Activity: Not on file  ?Stress: Not on file  ?Social Connections: Not on file  ?Intimate Partner Violence: Not on file  ? ?Family History  ?Problem Relation Age of Onset  ? Multiple sclerosis Mother   ? Psoriasis Mother   ? Depression Father   ? Diabetes Father   ? Diabetes Paternal Grandmother   ? ?BP 118/78   Pulse 64   Wt 91.1 kg (200 lb 12.8 oz)   SpO2 100%   BMI 29.65 kg/m?  ? ?Wt Readings from Last 3 Encounters:  ?10/11/21 91.1 kg (200 lb 12.8 oz)  ?08/30/21 88.1 kg (194 lb 3.2 oz)  ?08/08/21 93 kg (205 lb)  ? ?PHYSICAL EXAM: ?General:  NAD. No resp difficulty ?HEENT: Normal ?Neck: Supple. No JVD. Carotids 2+ bilat; no bruits. No lymphadenopathy or thryomegaly appreciated. ?Cor: PMI nondisplaced. Regular rate & rhythm. No rubs, gallops or murmurs. ?Lungs: Clear ?Abdomen: Soft, nontender, nondistended. No hepatosplenomegaly. No bruits or masses. Good bowel sounds. ?Extremities: No cyanosis, clubbing, rash, edema ?Neuro: Alert & oriented x 3, cranial nerves grossly intact. Moves all 4 extremities w/o difficulty. Affect pleasant. ? ?ECG: SB w/ 1st degree AVB, PR 236 msec, 55 bpm w/ PAC (personally reviewed). ? ?ASSESSMENT &  PLAN: ?1. Chronic Biventricular Heart Failure  ?- BiV failure likely primarily valvular in nature in setting of severe MR ?- TEE12/21 EF 40 to 45% with severe MR with multiple jets including a possible paravalvular leak. Miderate RV dysfunction severe TR ?- [facility] 3/22 showed normal coronaries, NICM EF 30-35%, severe MR w/ prominent v-waves in PCWP tracing, mild pulmonary venous HTN with normal CO (Fick cardiac output/index = 5.3/2.5). ?- Admit 12/22 for shock. Echo EF 20-25%, mild LV dilation, D-shaped septum, moderately decreased RV systolic function with mild RV enlargement, s/p MV repair  with mean gradient 15 mmHg and severe MR, mild TR, dilated IVC ?- s/p mechanical MVR on 06/10/21 at [facility] ?- Admit 2/23 w/ acute decompensated HF/ shock in the setting of recurrent Afib w/ RVR. Echo LVEF <20%, RV severely reduced. Mechanical MV ok, mGradient 3 mmHg. Required stabilization w/ milrinone. Converted to NSR w/ amio and diuresed w/ Lasix. Weaned off milrinone w/ stable co-ox   ?- Echo today 10/10/21 EF 25% Moderate RV dysfunction MV gradient mean 7 ?- Improved NYHA Class I-II. Volume status looks good today, weight down. ?- Continue Lasix 40 mg daily  ?- Continue spironolactone 25 mg daily.   ?- Continue digoxin 0.0625 mg daily.  ?- Continue Farxiga 10 mg daily.  ?- Increase losartan to 25  ?- Maintaince of NSR will be imperative (see below)  ?- Repeat echo next visit, if EF not improving, will plan CPX testing to assess need for advanced therapies. Also need to consider ICD. ?- BMET and dig level today. ?  ?2. Severe Mitral Valve Regurgitation s/p Mitral Valve Replacement  ?- previous MV repair in 2019 due to torn chord with subsequent recurrent MR. ?- s/p mechanical MVR at [facility] 06/10/21. ?- Echo 2/23 w/ stable prothesis, mGradient 3 mmHg  ?- On coumadin for mechanical valve. INR goal 2.5-3.5  ?- Followed by [facility] for monitoring. Will check INR today ?- Aware of need for SBE prophylaxis ? ?3.  Paroxsymal Atrial Fibrillation ?- In setting of long standing MV disease, echo w/ severe LAE,  LA diam: 5.90 cm ?- Unfortunately did not get MAZE at time of MVR  ?- He does not tolerate AF well w/ concomitant severe biventri

## 2021-10-11 NOTE — Patient Instructions (Signed)
Medication Changes: ? ?Stop Digoxin ? ?Increase Losartan to 25 mg Daily ? ?Lab Work: ? ?Labs done today, we will call you for abnormal results ? ?Testing/Procedures: ? ?None ? ?Referrals: ? ?None ? ?Special Instructions // Education: ? ?Do the following things EVERYDAY: ?Weigh yourself in the morning before breakfast. Write it down and keep it in a log. ?Take your medicines as prescribed ?Eat low salt foods--Limit salt (sodium) to 2000 mg per day.  ?Stay as active as you can everyday ?Limit all fluids for the day to less than 2 liters ? ? ?Follow-Up in: 2 months ? ?At the Advanced Heart Failure Clinic, you and your health needs are our priority. We have a designated team specialized in the treatment of Heart Failure. This Care Team includes your primary Heart Failure Specialized Cardiologist (physician), Advanced Practice Providers (APPs- Physician Assistants and Nurse Practitioners), and Pharmacist who all work together to provide you with the care you need, when you need it.  ? ?You may see any of the following providers on your designated Care Team at your next follow up: ? ?Dr Arvilla Meres ?Dr Marca Ancona ?Tonye Becket, NP ?Robbie Lis, PA ?Jessica Milford,NP ?Anna Genre, PA ?Karle Plumber, PharmD ? ? ?Please be sure to bring in all your medications bottles to every appointment.  ? ?Need to Contact us: ? ?If you have any questions or concerns before your next appointment please send [place] a message through [place] or call our office at [phone].   ? ?TO LEAVE A MESSAGE FOR THE NURSE SELECT OPTION 2, PLEASE LEAVE A MESSAGE INCLUDING: ?YOUR NAME ?DATE OF BIRTH ?CALL BACK NUMBER ?REASON FOR CALL**this is important as we prioritize the call backs ? ?YOU WILL RECEIVE A CALL BACK THE SAME DAY AS LONG AS YOU CALL BEFORE 4:00 PM ? ? ?

## 2021-10-18 ENCOUNTER — Telehealth: Payer: Self-pay | Admitting: Physician Assistant

## 2021-10-18 NOTE — Telephone Encounter (Signed)
Pt woke up 3 days ago with a swollen hand. The swelling is mainly in the ring finger of his L hand and that knuckle. ? ?No known bug bites, no injury. No wound. ? ?He does not have a PCP.  ? ?It is painful, Tylenol no help. ? ?It feels warm to touch.  ? ?He is on coumadin, no NSAIDs ? ?Recommended he use an ice pack and keep his hand elevated. Continue taking Tylenol. ? ?If no better tomorrow, go to an Urgent Care, he needs to be evaluated and he needs to get his ring off. ? ?Pt said he would. ? ?Theodore Demark, PA-C ?10/18/2021 ?5:32 PM ? ? ? ?

## 2021-10-29 ENCOUNTER — Telehealth (HOSPITAL_COMMUNITY): Payer: Self-pay | Admitting: *Deleted

## 2021-10-29 NOTE — Telephone Encounter (Signed)
Spoke with patient. Notes that since increasing the losartan his face has broken out with acne. No swelling of face/lips/tongue. Notes swelling in both of his ring fingers. Does not feel like this is due to fluid and does not feel that he is carrying fluid anywhere else. Has not had any other medication changes recently other than increasing the losartan. Will have patient hold the losartan for a week to see if symptoms improve. Will then restart at losartan 12.5 mg daily since he previously tolerated that dose. If symptoms recur after starting, will discontinue losartan. Patient expressed understanding.

## 2021-10-29 NOTE — Telephone Encounter (Signed)
Pt called stating since increasing losartan his face is broke out and both of his ring fingers are swollen. I told patient I would have our pharmacist call him to discuss medication side effects. ? ?Routed to Lauren Kemp,RPH ?

## 2021-10-31 ENCOUNTER — Ambulatory Visit (INDEPENDENT_AMBULATORY_CARE_PROVIDER_SITE_OTHER): Payer: Medicaid Other | Admitting: *Deleted

## 2021-10-31 DIAGNOSIS — Z5181 Encounter for therapeutic drug level monitoring: Secondary | ICD-10-CM | POA: Diagnosis not present

## 2021-10-31 DIAGNOSIS — Z952 Presence of prosthetic heart valve: Secondary | ICD-10-CM | POA: Diagnosis not present

## 2021-10-31 DIAGNOSIS — I4891 Unspecified atrial fibrillation: Secondary | ICD-10-CM | POA: Diagnosis not present

## 2021-10-31 LAB — POCT INR: INR: 3.4 — AB (ref 2.0–3.0)

## 2021-10-31 NOTE — Patient Instructions (Signed)
Amiodarone decreased to 100mg  daily on 08/30/21 Continue warfarin 2 tablets daily except 1 tablet on Mondays, Wednesdays and Fridays Continue greens/salads  Recheck in 3 wks

## 2021-11-03 ENCOUNTER — Encounter (HOSPITAL_COMMUNITY): Payer: Self-pay

## 2021-11-03 ENCOUNTER — Emergency Department (HOSPITAL_COMMUNITY)
Admission: EM | Admit: 2021-11-03 | Discharge: 2021-11-03 | Disposition: A | Discharge status: Home / Self Care | Payer: Medicaid Other | Attending: Emergency Medicine | Admitting: Emergency Medicine

## 2021-11-03 ENCOUNTER — Other Ambulatory Visit: Payer: Self-pay

## 2021-11-03 DIAGNOSIS — L88 Pyoderma gangrenosum: Secondary | ICD-10-CM

## 2021-11-03 DIAGNOSIS — L0390 Cellulitis, unspecified: Secondary | ICD-10-CM | POA: Diagnosis not present

## 2021-11-03 DIAGNOSIS — I5022 Chronic systolic (congestive) heart failure: Secondary | ICD-10-CM | POA: Insufficient documentation

## 2021-11-03 DIAGNOSIS — R21 Rash and other nonspecific skin eruption: Secondary | ICD-10-CM | POA: Diagnosis present

## 2021-11-03 DIAGNOSIS — Z7901 Long term (current) use of anticoagulants: Secondary | ICD-10-CM | POA: Insufficient documentation

## 2021-11-03 MED ORDER — DOXYCYCLINE HYCLATE 100 MG PO CAPS: 100.0000 mg | ORAL_CAPSULE | Freq: Two times a day (BID) | ORAL | 0 refills | Status: DC

## 2021-11-03 MED ORDER — PREDNISONE 20 MG PO TABS: 40.0000 mg | ORAL_TABLET | Freq: Every day | ORAL | 0 refills | Status: DC

## 2021-11-03 MED ORDER — DOXYCYCLINE HYCLATE 100 MG PO TABS
100.0000 mg | ORAL_TABLET | Freq: Once | ORAL | Status: AC
  Administered 2021-11-03: 100 mg via ORAL
  Filled 2021-11-03: qty 1

## 2021-11-03 MED ORDER — OXYCODONE-ACETAMINOPHEN 5-325 MG PO TABS: 1.0000 | ORAL_TABLET | Freq: Four times a day (QID) | ORAL | 0 refills | Status: DC | PRN

## 2021-11-03 NOTE — ED Provider Notes (Signed)
[place] EMERGENCY DEPARTMENT Provider Note   CSN: [id number] Arrival date & time: 11/03/21  1149     History  Chief Complaint  Patient presents with   Rash   Hand Pain    Donald [name] is a 36 y.o. male.  Patient is a 36 year old male with multiple medical problems including mitral regurgitation status post valve replacement on Coumadin with chronic systolic heart failure, paroxysmal atrial fibrillation and prior DVT who has a history of hidradenitis and pyoderma gangrenosum who is presenting today with worsening skin lesions that are breaking open and bleeding with intermittent pain.  He denies any fever, chills or systemic symptoms.  He continues to take all of his heart medicine and is not having any shortness of breath or issues with his heart at this time.  Patient reports he used to see a dermatologist but lost his insurance and had not seen them for a while.  He had been on Humira in the past and prednisone for his above skin conditions but is not taking those now.  He reports over the last 3 or 4 weeks he started to get more skin eruptions which they will break open and like pus and they are getting thicker most prevalent on the face upper extremities and lower extremities.  None on the torso.  The history is provided by the patient.  Rash Hand Pain      Home Medications Prior to Admission medications   Medication Sig Start Date End Date Taking? Authorizing Provider  doxycycline (VIBRAMYCIN) 100 MG capsule Take 1 capsule (100 mg total) by mouth 2 (two) times daily. 11/03/21  Yes Blanchie Dessert, MD  oxyCODONE-acetaminophen (PERCOCET/ROXICET) 5-325 MG tablet Take 1 tablet by mouth every 6 (six) hours as needed for severe pain. 11/03/21  Yes Whalen Trompeter, Loree Fee, MD  predniSONE (DELTASONE) 20 MG tablet Take 2 tablets (40 mg total) by mouth daily. Take 2 tablets (40 mg) daily for the next 5 days, then take 1 tablet (20 mg) daily for 5 days, then take 0.5  tablet (10mg ) for 4 days 11/03/21  Yes Blanchie Dessert, MD  acetaminophen (TYLENOL) 325 MG tablet Take 2 tablets (650 mg total) by mouth every 4 (four) hours as needed for headache or mild pain. 06/04/21   Clegg, Amy D, NP  amiodarone (PACERONE) 200 MG tablet Take 0.5 tablets (100 mg total) by mouth daily. 08/30/21   Milford, Maricela Bo, FNP  dapagliflozin propanediol (FARXIGA) 10 MG TABS tablet Take 1 tablet (10 mg total) by mouth daily. 10/09/21   Bensimhon, Shaune Pascal, MD  furosemide (LASIX) 40 MG tablet Take 1 tablet (40 mg total) by mouth daily. 08/02/21   Joette Catching, PA-C  losartan (COZAAR) 25 MG tablet Take 1 tablet (25 mg total) by mouth daily. 10/11/21   Bensimhon, Shaune Pascal, MD  pantoprazole (PROTONIX) 40 MG tablet Take 1 tablet (40 mg total) by mouth daily. 08/04/21   Larey Dresser, MD  potassium chloride SA (KLOR-CON M) 20 MEQ tablet Take 2 tablets (40 mEq total) by mouth daily. 10/11/21   Bensimhon, Shaune Pascal, MD  spironolactone (ALDACTONE) 25 MG tablet Take 1 tablet (25 mg total) by mouth daily. 08/02/21   Joette Catching, PA-C  warfarin (COUMADIN) 2 MG tablet Take 1 - 2 tablets daily or as directed by coumadin clinic 10/08/21   Barrett, Evelene Croon, PA-C      Allergies    Patient has no known allergies.    Review of Systems  Review of Systems  Skin:  Positive for rash.   Physical Exam Updated Vital Signs BP (!) 127/115 (BP Location: Right Arm)   Pulse 81   Temp 98.6 F (37 C) (Oral)   Resp 16   Ht 5\' 10"  (1.778 m)   Wt 90.7 kg   SpO2 99%   BMI 28.70 kg/m  Physical Exam Vitals and nursing note reviewed.  Constitutional:      General: He is not in acute distress.    Appearance: He is well-developed.  HENT:     Head: Normocephalic and atraumatic.  Eyes:     Conjunctiva/sclera: Conjunctivae normal.     Pupils: Pupils are equal, round, and reactive to light.  Cardiovascular:     Rate and Rhythm: Normal rate and regular rhythm.     Heart sounds: No murmur  heard. Pulmonary:     Effort: Pulmonary effort is normal. No respiratory distress.     Breath sounds: Normal breath sounds. No wheezing or rales.  Abdominal:     General: There is no distension.     Palpations: Abdomen is soft.     Tenderness: There is no abdominal tenderness. There is no guarding or rebound.  Musculoskeletal:        General: No tenderness. Normal range of motion.     Cervical back: Normal range of motion and neck supple.  Skin:    General: Skin is warm and dry.     Findings: Rash present. No erythema.     Comments: Multiple areas of lesions over the face, upper and lower extremities.  They are crusted over, raised and various areas are erythematous.  Very dense on the upper lip under the nose and on both cheeks as well as forehead.  There is an area on the right lower extremity that is raised, erythematous and mildly indurated without significant lesion yet.  Neurological:     Mental Status: He is alert and oriented to person, place, and time.  Psychiatric:        Behavior: Behavior normal.    ED Results / Procedures / Treatments   Labs (all labs ordered are listed, but only abnormal results are displayed) Labs Reviewed - No data to display  EKG None  Radiology No results found.  Procedures Procedures    Medications Ordered in ED Medications  doxycycline (VIBRA-TABS) tablet 100 mg (100 mg Oral Given 11/03/21 1255)    ED Course/ Medical Decision Making/ A&P                           Medical Decision Making Amount and/or Complexity of Data Reviewed External Data Reviewed: notes.  Risk Prescription drug management.   Patient presenting today with concern for exacerbation of his pyoderma gangrenosum with secondary bacterial infection as well as possible exacerbation of his hidradenitis.  Patient is not having systemic symptoms does not appear to have acute exacerbation of his known cardiac disease.  Patient had been without his insurance and recently  has gotten Medicaid and would like to see a dermatologist again.  In the past he was on Humira and prednisone.  We will treat with doxycycline for concern for staph and strep and will also start prednisone with taper.  Will need derm for ongoing management and care.  Patient will call the Coumadin clinic tomorrow to make sure he does not need to adjust his Coumadin level as he is on antibiotic and steroid at this time.  He  was given return precautions        Final Clinical Impression(s) / ED Diagnoses Final diagnoses:  Pyoderma gangrenosa  Cellulitis, unspecified cellulitis site    Rx / DC Orders ED Discharge Orders          Ordered    doxycycline (VIBRAMYCIN) 100 MG capsule  2 times daily        11/03/21 1306    predniSONE (DELTASONE) 20 MG tablet  Daily        11/03/21 1306    oxyCODONE-acetaminophen (PERCOCET/ROXICET) 5-325 MG tablet  Every 6 hours PRN        11/03/21 1306              Blanchie Dessert, MD 11/03/21 1306

## 2021-11-03 NOTE — ED Triage Notes (Signed)
Patient began with finger swelling to bilateral 4th and 5th digits 3 weeks ago. Patient reports that the swelling has made it hard for him to make a fist. Patient also with numerous blister-like areas to face and bilateral lower legs. Patient states these areas began appearing about a week ago. States areas are painful. Patient is a cardiac patient and states he is unsure if his medication is causing his symptoms.

## 2021-11-03 NOTE — Discharge Instructions (Addendum)
You can take another antibiotic tablet tonight before bed.  The steroids will be once a day.  You can use the pain medication as needed for severe pain.  Continue to do warm compresses on the area on your leg where it is draining.  If you start having fever and feeling terrible return to the emergency room.  It will also be important for you to talk with the Coumadin clinic tomorrow and let them know you are taking prednisone.

## 2021-11-04 ENCOUNTER — Telehealth: Payer: Self-pay | Admitting: Cardiology

## 2021-11-04 NOTE — Telephone Encounter (Signed)
Pt was in ED yesterday and started on doxycycline 100mg  bid x 10 days and prednisone 40mg  x 5, 20mg  x 5, 10mg  x 4.  Called pt to decrease warfarin dose until INR check. Told pt to decrease dose to 2mg  daily except 4mg  on Sundays.  He verbalized understanding.  Refill approved.

## 2021-11-04 NOTE — Telephone Encounter (Signed)
Pt c/o medication issue:  1. Name of Medication: warfarin (COUMADIN) 2 MG tablet  2. How are you currently taking this medication (dosage and times per day)? Yes  3. Are you having a reaction (difficulty breathing--STAT)?   4. What is your medication issue? Pt was started on prednisone at ED yesterday and would like to know if he should continue to take the coumadin as prescribed with this new medication. Please advise.

## 2021-11-13 ENCOUNTER — Other Ambulatory Visit: Payer: Self-pay | Admitting: Cardiology

## 2021-11-18 ENCOUNTER — Other Ambulatory Visit (HOSPITAL_COMMUNITY): Payer: Self-pay

## 2021-11-18 MED ORDER — FUROSEMIDE 40 MG PO TABS: 40.0000 mg | ORAL_TABLET | Freq: Every day | ORAL | 1 refills | Status: DC

## 2021-11-21 ENCOUNTER — Ambulatory Visit (INDEPENDENT_AMBULATORY_CARE_PROVIDER_SITE_OTHER): Payer: Medicaid Other | Admitting: *Deleted

## 2021-11-21 DIAGNOSIS — Z952 Presence of prosthetic heart valve: Secondary | ICD-10-CM

## 2021-11-21 DIAGNOSIS — Z5181 Encounter for therapeutic drug level monitoring: Secondary | ICD-10-CM | POA: Diagnosis not present

## 2021-11-21 LAB — POCT INR: INR: 1.5 — AB (ref 2.0–3.0)

## 2021-11-21 NOTE — Patient Instructions (Signed)
Amiodarone decreased to 100mg  daily on 08/30/21 Take warfarin 3 tablets tonight, 2 tablet tomorrow night then continue 2 tablets daily except 1 tablet on Mondays, Wednesdays and Fridays Continue greens/salads  Recheck in 3 wks

## 2021-12-04 ENCOUNTER — Other Ambulatory Visit: Payer: Self-pay | Admitting: Cardiology

## 2021-12-09 ENCOUNTER — Ambulatory Visit (INDEPENDENT_AMBULATORY_CARE_PROVIDER_SITE_OTHER): Payer: Medicaid Other | Admitting: *Deleted

## 2021-12-09 DIAGNOSIS — Z952 Presence of prosthetic heart valve: Secondary | ICD-10-CM | POA: Diagnosis not present

## 2021-12-09 DIAGNOSIS — Z5181 Encounter for therapeutic drug level monitoring: Secondary | ICD-10-CM | POA: Diagnosis not present

## 2021-12-09 LAB — POCT INR: INR: 1.5 — AB (ref 2.0–3.0)

## 2021-12-23 ENCOUNTER — Ambulatory Visit (INDEPENDENT_AMBULATORY_CARE_PROVIDER_SITE_OTHER): Payer: Medicaid Other | Admitting: *Deleted

## 2021-12-23 DIAGNOSIS — Z952 Presence of prosthetic heart valve: Secondary | ICD-10-CM

## 2021-12-23 DIAGNOSIS — Z5181 Encounter for therapeutic drug level monitoring: Secondary | ICD-10-CM

## 2021-12-23 LAB — POCT INR: INR: 1.4 — AB (ref 2.0–3.0)

## 2021-12-23 NOTE — Patient Instructions (Signed)
Amiodarone decreased to 100mg  daily on 08/30/21 Take warfarin 3 tablets tonight and tomorrow night then increase dose to 2 tablets daily  Continue greens/salads  Recheck in 1 wk

## 2021-12-27 ENCOUNTER — Emergency Department (HOSPITAL_COMMUNITY): Payer: Medicaid Other

## 2021-12-27 ENCOUNTER — Inpatient Hospital Stay (HOSPITAL_COMMUNITY)
Admission: EM | Admit: 2021-12-27 | Discharge: 2022-01-17 | DRG: 981 | Disposition: A | Discharge status: Other Acute Inpatient Hospital | Payer: Medicaid Other | Attending: Family Medicine | Admitting: Family Medicine

## 2021-12-27 ENCOUNTER — Encounter (HOSPITAL_COMMUNITY): Payer: Self-pay

## 2021-12-27 ENCOUNTER — Other Ambulatory Visit: Payer: Self-pay

## 2021-12-27 ENCOUNTER — Observation Stay (HOSPITAL_COMMUNITY): Payer: Medicaid Other

## 2021-12-27 DIAGNOSIS — K7200 Acute and subacute hepatic failure without coma: Secondary | ICD-10-CM | POA: Diagnosis present

## 2021-12-27 DIAGNOSIS — R197 Diarrhea, unspecified: Secondary | ICD-10-CM | POA: Diagnosis not present

## 2021-12-27 DIAGNOSIS — I330 Acute and subacute infective endocarditis: Secondary | ICD-10-CM | POA: Insufficient documentation

## 2021-12-27 DIAGNOSIS — R112 Nausea with vomiting, unspecified: Secondary | ICD-10-CM

## 2021-12-27 DIAGNOSIS — R160 Hepatomegaly, not elsewhere classified: Secondary | ICD-10-CM | POA: Diagnosis not present

## 2021-12-27 DIAGNOSIS — I63411 Cerebral infarction due to embolism of right middle cerebral artery: Secondary | ICD-10-CM | POA: Diagnosis not present

## 2021-12-27 DIAGNOSIS — R778 Other specified abnormalities of plasma proteins: Secondary | ICD-10-CM | POA: Diagnosis not present

## 2021-12-27 DIAGNOSIS — I252 Old myocardial infarction: Secondary | ICD-10-CM

## 2021-12-27 DIAGNOSIS — Z7984 Long term (current) use of oral hypoglycemic drugs: Secondary | ICD-10-CM

## 2021-12-27 DIAGNOSIS — Z820 Family history of epilepsy and other diseases of the nervous system: Secondary | ICD-10-CM

## 2021-12-27 DIAGNOSIS — I5023 Acute on chronic systolic (congestive) heart failure: Secondary | ICD-10-CM | POA: Diagnosis not present

## 2021-12-27 DIAGNOSIS — Z20822 Contact with and (suspected) exposure to covid-19: Secondary | ICD-10-CM | POA: Diagnosis not present

## 2021-12-27 DIAGNOSIS — I38 Endocarditis, valve unspecified: Secondary | ICD-10-CM | POA: Insufficient documentation

## 2021-12-27 DIAGNOSIS — D509 Iron deficiency anemia, unspecified: Secondary | ICD-10-CM | POA: Diagnosis not present

## 2021-12-27 DIAGNOSIS — I480 Paroxysmal atrial fibrillation: Secondary | ICD-10-CM

## 2021-12-27 DIAGNOSIS — I5022 Chronic systolic (congestive) heart failure: Secondary | ICD-10-CM

## 2021-12-27 DIAGNOSIS — E7800 Pure hypercholesterolemia, unspecified: Secondary | ICD-10-CM | POA: Diagnosis present

## 2021-12-27 DIAGNOSIS — L02412 Cutaneous abscess of left axilla: Secondary | ICD-10-CM | POA: Diagnosis not present

## 2021-12-27 DIAGNOSIS — R29700 NIHSS score 0: Secondary | ICD-10-CM | POA: Diagnosis not present

## 2021-12-27 DIAGNOSIS — T826XXA Infection and inflammatory reaction due to cardiac valve prosthesis, initial encounter: Principal | ICD-10-CM | POA: Diagnosis present

## 2021-12-27 DIAGNOSIS — L88 Pyoderma gangrenosum: Secondary | ICD-10-CM | POA: Diagnosis present

## 2021-12-27 DIAGNOSIS — I110 Hypertensive heart disease with heart failure: Secondary | ICD-10-CM | POA: Diagnosis present

## 2021-12-27 DIAGNOSIS — Y831 Surgical operation with implant of artificial internal device as the cause of abnormal reaction of the patient, or of later complication, without mention of misadventure at the time of the procedure: Secondary | ICD-10-CM | POA: Diagnosis present

## 2021-12-27 DIAGNOSIS — I429 Cardiomyopathy, unspecified: Secondary | ICD-10-CM

## 2021-12-27 DIAGNOSIS — Z8673 Personal history of transient ischemic attack (TIA), and cerebral infarction without residual deficits: Secondary | ICD-10-CM

## 2021-12-27 DIAGNOSIS — J9601 Acute respiratory failure with hypoxia: Secondary | ICD-10-CM | POA: Diagnosis not present

## 2021-12-27 DIAGNOSIS — N179 Acute kidney failure, unspecified: Secondary | ICD-10-CM

## 2021-12-27 DIAGNOSIS — I4892 Unspecified atrial flutter: Secondary | ICD-10-CM | POA: Diagnosis present

## 2021-12-27 DIAGNOSIS — R7401 Elevation of levels of liver transaminase levels: Secondary | ICD-10-CM

## 2021-12-27 DIAGNOSIS — E860 Dehydration: Secondary | ICD-10-CM

## 2021-12-27 DIAGNOSIS — G8194 Hemiplegia, unspecified affecting left nondominant side: Secondary | ICD-10-CM | POA: Diagnosis not present

## 2021-12-27 DIAGNOSIS — Z952 Presence of prosthetic heart valve: Secondary | ICD-10-CM

## 2021-12-27 DIAGNOSIS — E861 Hypovolemia: Secondary | ICD-10-CM | POA: Diagnosis present

## 2021-12-27 DIAGNOSIS — Z7901 Long term (current) use of anticoagulants: Secondary | ICD-10-CM | POA: Diagnosis not present

## 2021-12-27 DIAGNOSIS — R739 Hyperglycemia, unspecified: Secondary | ICD-10-CM | POA: Diagnosis not present

## 2021-12-27 DIAGNOSIS — A419 Sepsis, unspecified organism: Secondary | ICD-10-CM | POA: Diagnosis present

## 2021-12-27 DIAGNOSIS — Z833 Family history of diabetes mellitus: Secondary | ICD-10-CM

## 2021-12-27 DIAGNOSIS — R109 Unspecified abdominal pain: Secondary | ICD-10-CM | POA: Diagnosis not present

## 2021-12-27 DIAGNOSIS — R509 Fever, unspecified: Secondary | ICD-10-CM | POA: Diagnosis not present

## 2021-12-27 DIAGNOSIS — K2970 Gastritis, unspecified, without bleeding: Secondary | ICD-10-CM | POA: Diagnosis present

## 2021-12-27 DIAGNOSIS — R531 Weakness: Secondary | ICD-10-CM

## 2021-12-27 DIAGNOSIS — Z87891 Personal history of nicotine dependence: Secondary | ICD-10-CM

## 2021-12-27 DIAGNOSIS — R6521 Severe sepsis with septic shock: Secondary | ICD-10-CM | POA: Diagnosis not present

## 2021-12-27 DIAGNOSIS — I5082 Biventricular heart failure: Secondary | ICD-10-CM | POA: Diagnosis present

## 2021-12-27 DIAGNOSIS — K529 Noninfective gastroenteritis and colitis, unspecified: Secondary | ICD-10-CM

## 2021-12-27 DIAGNOSIS — L03112 Cellulitis of left axilla: Secondary | ICD-10-CM | POA: Diagnosis not present

## 2021-12-27 DIAGNOSIS — M60022 Infective myositis, left upper arm: Secondary | ICD-10-CM | POA: Diagnosis not present

## 2021-12-27 DIAGNOSIS — K5900 Constipation, unspecified: Secondary | ICD-10-CM | POA: Diagnosis not present

## 2021-12-27 DIAGNOSIS — N170 Acute kidney failure with tubular necrosis: Secondary | ICD-10-CM | POA: Diagnosis not present

## 2021-12-27 DIAGNOSIS — R7989 Other specified abnormal findings of blood chemistry: Secondary | ICD-10-CM | POA: Diagnosis not present

## 2021-12-27 DIAGNOSIS — D649 Anemia, unspecified: Secondary | ICD-10-CM | POA: Diagnosis present

## 2021-12-27 DIAGNOSIS — I5043 Acute on chronic combined systolic (congestive) and diastolic (congestive) heart failure: Secondary | ICD-10-CM

## 2021-12-27 DIAGNOSIS — M4690 Unspecified inflammatory spondylopathy, site unspecified: Secondary | ICD-10-CM | POA: Diagnosis present

## 2021-12-27 DIAGNOSIS — R791 Abnormal coagulation profile: Secondary | ICD-10-CM | POA: Diagnosis present

## 2021-12-27 DIAGNOSIS — E871 Hypo-osmolality and hyponatremia: Secondary | ICD-10-CM | POA: Diagnosis not present

## 2021-12-27 DIAGNOSIS — E876 Hypokalemia: Secondary | ICD-10-CM | POA: Diagnosis not present

## 2021-12-27 DIAGNOSIS — Z79899 Other long term (current) drug therapy: Secondary | ICD-10-CM

## 2021-12-27 DIAGNOSIS — Z86718 Personal history of other venous thrombosis and embolism: Secondary | ICD-10-CM

## 2021-12-27 DIAGNOSIS — E874 Mixed disorder of acid-base balance: Secondary | ICD-10-CM | POA: Diagnosis present

## 2021-12-27 DIAGNOSIS — Z818 Family history of other mental and behavioral disorders: Secondary | ICD-10-CM

## 2021-12-27 DIAGNOSIS — E162 Hypoglycemia, unspecified: Secondary | ICD-10-CM | POA: Diagnosis present

## 2021-12-27 DIAGNOSIS — I639 Cerebral infarction, unspecified: Secondary | ICD-10-CM

## 2021-12-27 DIAGNOSIS — K761 Chronic passive congestion of liver: Secondary | ICD-10-CM | POA: Diagnosis present

## 2021-12-27 DIAGNOSIS — I339 Acute and subacute endocarditis, unspecified: Secondary | ICD-10-CM

## 2021-12-27 DIAGNOSIS — M799 Soft tissue disorder, unspecified: Secondary | ICD-10-CM | POA: Diagnosis present

## 2021-12-27 DIAGNOSIS — R9431 Abnormal electrocardiogram [ECG] [EKG]: Secondary | ICD-10-CM

## 2021-12-27 DIAGNOSIS — G9341 Metabolic encephalopathy: Secondary | ICD-10-CM | POA: Diagnosis present

## 2021-12-27 DIAGNOSIS — R579 Shock, unspecified: Secondary | ICD-10-CM

## 2021-12-27 DIAGNOSIS — R570 Cardiogenic shock: Secondary | ICD-10-CM | POA: Diagnosis not present

## 2021-12-27 DIAGNOSIS — I2510 Atherosclerotic heart disease of native coronary artery without angina pectoris: Secondary | ICD-10-CM | POA: Diagnosis present

## 2021-12-27 DIAGNOSIS — R059 Cough, unspecified: Secondary | ICD-10-CM | POA: Diagnosis not present

## 2021-12-27 DIAGNOSIS — I509 Heart failure, unspecified: Secondary | ICD-10-CM

## 2021-12-27 DIAGNOSIS — M79602 Pain in left arm: Secondary | ICD-10-CM

## 2021-12-27 DIAGNOSIS — N189 Chronic kidney disease, unspecified: Secondary | ICD-10-CM | POA: Diagnosis present

## 2021-12-27 LAB — LACTIC ACID, PLASMA
Lactic Acid, Venous: 2.3 mmol/L (ref 0.5–1.9)
Lactic Acid, Venous: 1.7 mmol/L (ref 0.5–1.9)

## 2021-12-27 LAB — URINALYSIS, ROUTINE W REFLEX MICROSCOPIC
Bilirubin Urine: NEGATIVE
Glucose, UA: NEGATIVE mg/dL
Ketones, ur: NEGATIVE mg/dL
Leukocytes,Ua: NEGATIVE
Nitrite: NEGATIVE
Protein, ur: >=300 mg/dL — AB
Specific Gravity, Urine: 1.025 (ref 1.005–1.030)
pH: 5 (ref 5.0–8.0)

## 2021-12-27 LAB — BRAIN NATRIURETIC PEPTIDE: B Natriuretic Peptide: >4500 pg/mL — ABNORMAL HIGH (ref 0.0–100.0)

## 2021-12-27 LAB — CBC WITH DIFFERENTIAL/PLATELET
Abs Immature Granulocytes: 0.04 10*3/uL (ref 0.00–0.07)
Basophils Absolute: 0 10*3/uL (ref 0.0–0.1)
Basophils Relative: 0 %
Eosinophils Absolute: 0.2 10*3/uL (ref 0.0–0.5)
Eosinophils Relative: 2 %
HCT: 32.2 % — ABNORMAL LOW (ref 39.0–52.0)
Hemoglobin: 10.6 g/dL — ABNORMAL LOW (ref 13.0–17.0)
Immature Granulocytes: 1 %
Lymphocytes Relative: 23 %
Lymphs Abs: 1.4 10*3/uL (ref 0.7–4.0)
MCH: 26.2 pg (ref 26.0–34.0)
MCHC: 32.9 g/dL (ref 30.0–36.0)
MCV: 79.5 fL — ABNORMAL LOW (ref 80.0–100.0)
Monocytes Absolute: 0.3 10*3/uL (ref 0.1–1.0)
Monocytes Relative: 5 %
Neutro Abs: 4.2 10*3/uL (ref 1.7–7.7)
Neutrophils Relative %: 69 %
Platelets: 369 10*3/uL (ref 150–400)
RBC: 4.05 MIL/uL — ABNORMAL LOW (ref 4.22–5.81)
RDW: 14.7 % (ref 11.5–15.5)
WBC: 6.2 10*3/uL (ref 4.0–10.5)
nRBC: 0 % (ref 0.0–0.2)

## 2021-12-27 LAB — COMPREHENSIVE METABOLIC PANEL
ALT: 41 U/L (ref 0–44)
AST: 110 U/L — ABNORMAL HIGH (ref 15–41)
Albumin: 3 g/dL — ABNORMAL LOW (ref 3.5–5.0)
Alkaline Phosphatase: 112 U/L (ref 38–126)
Anion gap: 14 (ref 5–15)
BUN: 20 mg/dL (ref 6–20)
CO2: 18 mmol/L — ABNORMAL LOW (ref 22–32)
Calcium: 8.8 mg/dL — ABNORMAL LOW (ref 8.9–10.3)
Chloride: 95 mmol/L — ABNORMAL LOW (ref 98–111)
Creatinine, Ser: 1.4 mg/dL — ABNORMAL HIGH (ref 0.61–1.24)
GFR, Estimated: >60 mL/min (ref >60)
Glucose, Bld: 116 mg/dL — ABNORMAL HIGH (ref 70–99)
Potassium: 4.4 mmol/L (ref 3.5–5.1)
Sodium: 127 mmol/L — ABNORMAL LOW (ref 135–145)
Total Bilirubin: 0.5 mg/dL (ref 0.3–1.2)
Total Protein: 10.2 g/dL — ABNORMAL HIGH (ref 6.5–8.1)

## 2021-12-27 LAB — PROTIME-INR
INR: 1.4 — ABNORMAL HIGH (ref 0.8–1.2)
Prothrombin Time: 16.8 seconds — ABNORMAL HIGH (ref 11.4–15.2)

## 2021-12-27 LAB — RESP PANEL BY RT-PCR (FLU A&B, COVID) ARPGX2
Influenza A by PCR: NEGATIVE
Influenza B by PCR: NEGATIVE
SARS Coronavirus 2 by RT PCR: NEGATIVE

## 2021-12-27 LAB — TROPONIN I (HIGH SENSITIVITY)
Troponin I (High Sensitivity): 257 ng/L (ref <18)
Troponin I (High Sensitivity): 210 ng/L (ref <18)

## 2021-12-27 MED ORDER — ACETAMINOPHEN 325 MG PO TABS
650.0000 mg | ORAL_TABLET | Freq: Once | ORAL | Status: AC | PRN
  Administered 2021-12-27: 650 mg via ORAL
  Filled 2021-12-27: qty 2

## 2021-12-27 MED ORDER — VANCOMYCIN HCL 1750 MG/350ML IV SOLN
1750.0000 mg | Freq: Once | INTRAVENOUS | Status: AC
  Administered 2021-12-27: 1750 mg via INTRAVENOUS
  Filled 2021-12-27: qty 350

## 2021-12-27 MED ORDER — ENOXAPARIN SODIUM 100 MG/ML IJ SOSY
1.0000 mg/kg | PREFILLED_SYRINGE | Freq: Two times a day (BID) | INTRAMUSCULAR | Status: DC
Start: 2021-12-27 — End: 2021-12-28
  Administered 2021-12-27: 90 mg via SUBCUTANEOUS
  Filled 2021-12-27 (×2): qty 0.9

## 2021-12-27 MED ORDER — AMIODARONE HCL 200 MG PO TABS
100.0000 mg | ORAL_TABLET | Freq: Two times a day (BID) | ORAL | Status: DC
  Administered 2021-12-27: 100 mg via ORAL
  Filled 2021-12-27: qty 1

## 2021-12-27 MED ORDER — LACTATED RINGERS IV BOLUS
1000.0000 mL | Freq: Once | INTRAVENOUS | Status: AC
  Administered 2021-12-27: 1000 mL via INTRAVENOUS

## 2021-12-27 MED ORDER — VANCOMYCIN HCL IN DEXTROSE 1-5 GM/200ML-% IV SOLN: 1000.0000 mg | Freq: Once | INTRAVENOUS | Status: DC

## 2021-12-27 MED ORDER — SODIUM CHLORIDE 0.9 % IV SOLN
2.0000 g | Freq: Three times a day (TID) | INTRAVENOUS | Status: DC
  Administered 2021-12-27 – 2021-12-28 (×2): 2 g via INTRAVENOUS
  Filled 2021-12-27 (×2): qty 12.5

## 2021-12-27 MED ORDER — SODIUM CHLORIDE 0.9 % IV SOLN: 12.5000 mg | Freq: Three times a day (TID) | INTRAVENOUS | Status: DC | PRN

## 2021-12-27 MED ORDER — ACETAMINOPHEN 325 MG PO TABS
650.0000 mg | ORAL_TABLET | ORAL | Status: DC | PRN
  Administered 2021-12-28: 650 mg via ORAL
  Filled 2021-12-27: qty 2

## 2021-12-27 MED ORDER — SODIUM CHLORIDE 0.9 % IV SOLN
2.0000 g | Freq: Three times a day (TID) | INTRAVENOUS | Status: DC
  Filled 2021-12-27: qty 12.5

## 2021-12-27 MED ORDER — SODIUM CHLORIDE 0.9 % IV SOLN: 2.0000 g | Freq: Once | INTRAVENOUS | Status: DC

## 2021-12-27 MED ORDER — WARFARIN - PHARMACIST DOSING INPATIENT: Freq: Every day | Status: DC

## 2021-12-27 MED ORDER — LOPERAMIDE HCL 2 MG PO CAPS: 4.0000 mg | ORAL_CAPSULE | ORAL | Status: DC | PRN

## 2021-12-27 MED ORDER — VANCOMYCIN HCL 1250 MG/250ML IV SOLN
1250.0000 mg | Freq: Two times a day (BID) | INTRAVENOUS | Status: DC
  Administered 2021-12-28: 1250 mg via INTRAVENOUS
  Filled 2021-12-27 (×2): qty 250

## 2021-12-27 MED ORDER — METRONIDAZOLE 500 MG/100ML IV SOLN
500.0000 mg | Freq: Once | INTRAVENOUS | Status: AC
  Administered 2021-12-27: 500 mg via INTRAVENOUS
  Filled 2021-12-27: qty 100

## 2021-12-27 MED ORDER — IOHEXOL 300 MG/ML  SOLN
80.0000 mL | Freq: Once | INTRAMUSCULAR | Status: AC | PRN
  Administered 2021-12-27: 80 mL via INTRAVENOUS

## 2021-12-27 MED ORDER — WARFARIN SODIUM 6 MG PO TABS
6.0000 mg | ORAL_TABLET | Freq: Once | ORAL | Status: AC
Start: 2021-12-27 — End: 2021-12-27
  Administered 2021-12-27: 6 mg via ORAL
  Filled 2021-12-27: qty 1

## 2021-12-27 NOTE — ED Notes (Signed)
Unable to get an iv  iv team contacted

## 2021-12-27 NOTE — ED Provider Triage Note (Signed)
Emergency Medicine Provider Triage Evaluation Note  Donald [name] , a 36 y.o. male  was evaluated in triage.  Pt complains of feeling poorly for two days.  He has been having diarrhea, chills, N/V, shortness of breath. Cough with out congestion.  He takes warfarin for a mechanical mitral valve. He reports compliance with all his meds.  Denies wounds or sores.  No known sick contacts. No tick bites.    Physical Exam  BP (!) 117/96 (BP Location: Right Arm)   Pulse (!) 107   Temp (!) 101.3 F (38.5 C) (Oral)   Resp 18   Ht 5\' 10"  (1.778 m)   Wt 90.7 kg   SpO2 98%   BMI 28.70 kg/m  Gen:   Awake, mild diaphoresis  Resp:  Normal effort, mild wheeze MSK:   Moves extremities without difficulty  Other:  Normal speech.    Medical Decision Making  Medically screening exam initiated at 2:13 PM.  Appropriate orders placed.  Donald [name] was informed that the remainder of the evaluation will be completed by another provider, this initial triage assessment does not replace that evaluation, and the importance of remaining in the ED until their evaluation is complete.     Mayford Knife, Cristina Gong 12/27/21 1444

## 2021-12-27 NOTE — Progress Notes (Addendum)
Addendum: FM decided in favor of enoxaparin bridge therapy until INR therapeutic Start enoxaparin 1mg /kg q12h until INR therapeutic Warfarin plan and monitoring as outlined below  , PharmD, BCPS 12/27/2021 7:55 PM ED Clinical Pharmacist -  [phone]  ANTICOAGULATION CONSULT NOTE - Initial Consult  Pharmacy Consult for Warfarin Indication:  MVR  No Known Allergies  Patient Measurements: Height: 5\' 10"  (177.8 cm) Weight: 90.7 kg (200 lb) IBW/kg (Calculated) : 73 Heparin Dosing Weight: 90.7 kg  Vital Signs: Temp: 99.9 F (37.7 C) (07/14 1743) Temp Source: Oral (07/14 1336) BP: 111/84 (07/14 1730) Pulse Rate: 94 (07/14 1730)  Labs: Recent Labs    12/27/21 1451  HGB 10.6*  HCT 32.2*  PLT 369  LABPROT 16.8*  INR 1.4*  CREATININE 1.40*  TROPONINIHS 257*    Estimated Creatinine Clearance: 85 mL/min (A) (by C-G formula based on SCr of 1.4 mg/dL (H)).   Medical History: Past Medical History:  Diagnosis Date   Anemia    Autoimmune disorder (HCC)    pyoderma gangrenosum   CHF (congestive heart failure) (HCC)    Chronic systolic heart failure (HCC)    a. EF 35-40% by echo in 07/2018 b. EF at 45% by repeat echo in 04/2020   DVT (deep venous thrombosis) (HCC)    h/o   Dysrhythmia    Mitral regurgitation    a. 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 b. 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HCC)    arthritis   Tricuspid regurgitation    Medications:  (Not in a hospital admission)  Scheduled:  Infusions:   ceFEPime (MAXIPIME) IV     lactated ringers     metronidazole     [START ON 12/28/2021] vancomycin     vancomycin     PRN:   Assessment: 35 yom with a history of mitral regurgitation s/p valve replacement on  Coumadin, HF, AF and prior DVT, and Hx of hidradenitis and pyoderma gangrenosum . Patient presenting with weakness. Warfarin per pharmacy consult placed for MVR.  Patient taking warfarin pta. Home dose is 4mg  daily per last [facility]. INR 1.4 on 7/10 instructed to take 6mg  that night and then continue 4mg  daily. Last taken 7/13.  Receiving flagyl IV therapy.  Hgb 10.6 at BL; plt 369 PT / INR 16.8/1.4  Goal of Therapy:  INR 2.5-3.5 Monitor platelets by anticoagulation protocol: Yes   Plan:  Admitting team to circle back regarding decision on parenteral bridge therapy Give 6mg  warfarin tonight Subsequent dosing per INR Monitor H/H, plt, s/s of bleeding, and avoid DDIs  [facility], PharmD, BCPS 12/27/2021 6:20 PM ED Clinical Pharmacist -  [phone]

## 2021-12-27 NOTE — Plan of Care (Signed)
  Problem: Education: Goal: Knowledge of General Education information will improve Description: Including pain rating scale, medication(s)/side effects and non-pharmacologic comfort measures Outcome: Progressing   Problem: Activity: Goal: Risk for activity intolerance will decrease Outcome: Progressing   Problem: Nutrition: Goal: Adequate nutrition will be maintained Outcome: Progressing   Problem: Safety: Goal: Ability to remain free from injury will improve Outcome: Progressing   Problem: Skin Integrity: Goal: Risk for impaired skin integrity will decrease Outcome: Progressing   Problem: Education: Goal: Ability to demonstrate management of disease process will improve Outcome: Progressing   Problem: Activity: Goal: Capacity to carry out activities will improve Outcome: Progressing

## 2021-12-27 NOTE — H&P (Addendum)
Hospital Admission History and Physical Service Pager: [phone]  Patient name: Donald [name] Medical record number: [id number] Date of Birth: 30-May-1986 Age: 36 y.o. Gender: male  Primary Care Provider: Dolores Patty, MD Consultants: None Code Status: FULL CODE  Preferred Emergency Contact:  Contact Information     Name Relation Home Work Mobile   [place] Father [phone]     Chief Complaint: Fever, vomiting, diarrhea, weakness   Assessment and Plan: Donald [name] is a 36 y.o. male presenting with vomiting and diarrhea. Differential for this patient's presentation of this includes gastroenteritis, endocarditis, sepsis, IBD, medication sideeffects. Endocarditis cannot be ruled out with history of valvular replacement, but patient currently has only minor criteria of fever which has resolved. Sepsis unlikely with no obvious infection source, fever more likely explained by viral gastroenteritis. Acute nature of diarrhea make IBD/IBS unlikely.  * Gastroenteritis Acute vomiting and diarrhea with decreased PO intake. Patient initially febrile with mild blood pressures. On exam stable. Remote history of antibiotic use in May. Met SIRS criteria with concern for sepsis in ED with LA 2.3. Na+ 127, Cl- 95, CO2 18, Cr 1.40.  Mixed picture of alkalosis due to vomiting versus chloride loss due to diarrhea. No obvious source of infection (UA, CXR not indicative of infection). Likely, viral versus bacterial gastroenteritis including C diff, versus GI parasite. Rule out endocarditis. -Admitted to [facility] Medicine Teaching Service, attending Dr. Terisa Starr, MD -GI PCR panel -s/p LR bolus in ED -Not started on maintenance fluids with concern for worsening heart function -Full Liquid diet -Loperamide PRN, low concern for C diff -Follow-up blood cultures -Vanc and Cefepime, consider stopping in AM -Replete electrolytes as needed   Paroxysmal atrial flutter  (HCC) Normal sinus rhythm, regular rate on exam. -Continue home Amiodarone 100 BID  AKI (acute kidney injury) (HCC) Cr 1.40 from baseline of ~1.0. Like prerenal due to volume depletion in setting of vomiting and diarrhea. -S/p bolus -CMP in AM -Full Liquid diet  Chronic heart failure (HCC) Patient hx of mitral valve replacement, and chronic heart failure. PTT 1.4 on admission. Last ECHO 10/11/21 EF 20-25%. Appreciable JVD on exam, BNP elevated >4500, otherwise exam not concerning for volume overload. Cr 1.40 Unlikely acute heart failure exacerbation, in setting of diarrhea/vomiting. EKG significant for prolonged QT. -Follow-up ECHO -Consult cardiology in AM -Holding home Losartan, Furosemide, Farxiga, and Spironolactone in setting of AKI -Patient on Warfarin at home, bridging with Lovenox       FEN/GI: Full liquid diet, advance as tolerated VTE Prophylaxis: Lovenox bridge from Warfarin  Disposition: Med-Tele  History of Present Illness:  Donald [name] is a 37 y.o. male with extensive cardiac PMHx of biventricular heart failure, severe mitral valve regurgitation s/p mitral valve replacement, and paroxysmal atrial fibrillation, presenting with vomiting and diarrhea.  States that he started having a subjective fever, felt weak, and then started having diarrhea and vomiting. This has been ongoing for the last 2-3 days. He estimates "at least 40 times" of diarrhea, about 3 times of vomiting. States that he has not eaten in 2 days. He has been able to drink liquids but states that he has to go to the bathroom right after and has diarrhea.  No hematemesis. His diarrhea seemed dark, dark brown.   Shortness of breath for the last 2-3 days. Both at rest and on exertion. Worse with laying back. He doesn't normally feel short of breath. No chest pain. No abdominal pain.   Felt  lightheaded today, seems to have resolved since coming to the ED. No difficulty urinating.   He notes  that sometimes his heart will go out of rhythm and will cause him to vomit, but he denies feeling any palpitations this time. No recent changes to his medications.    No cough, runny nose, ear pain. Hasn't noted any weight changes. Denies abdominal distension. Wt typically runs 196-200 lbs.    In the ED, labs notable for INR 1.4, PT 16.8, Na 127, Cr 1.40, AST 110, Hg 10.6, MCV 79.5, Trop 257-->210, LA 2.3-->1.7, EKG sinus tachycardia, prolonged QTc, CXR no acute cardiopulmonary disease, started on Cefepime and Vancomycin. CT abd/pelvis normal. Given 1L bolus of LR.   Review Of Systems: Per HPI.  Pertinent Past Medical History: Hx Atrial fibrillation, MVR. Denies hx of PE/DVT  Remainder reviewed in history tab.   Pertinent Past Surgical History: MVR  Remainder reviewed in history tab.  Pertinent Social History: Tobacco use: No Alcohol use: No Other Substance use: No Lives with father.   Pertinent Family History: Dad: with diabetes Mom: Has MS   Remainder reviewed in history tab.   Important Outpatient Medications: Lasix 40 mg daily  spironolactone 12.5 mg daily.   Farxiga 10 mg daily.  Losartan to 25 Warfarin 3 tablets Monday and Tuesday, 2 tablets other day  Amio 100 BID  Remainder reviewed in medication history.   Objective: BP (!) 114/93 (BP Location: Right Arm)   Pulse 99   Temp 100.2 F (37.9 C) (Oral)   Resp 18   Ht 5\' 10"  (1.778 m)   Wt 90.7 kg   SpO2 100%   BMI 28.70 kg/m  Exam: General: A&O, NAD HEENT: No sign of trauma, EOM grossly intact Cardiac: RRR, no m/r/g, appreciable JVD Respiratory: breathing comfortably with full recline of hospital bed, CTAB, normal WOB, no w/c/r GI: Soft, NTTP, non-distended  Extremities: NTTP, no peripheral edema, distal pulses 2+ in all four extremities Neuro: Normal gait, moves all four extremities appropriately. Psych: Appropriate mood and affect   Labs:  CBC BMET  Recent Labs  Lab 12/27/21 1451  WBC 6.2  HGB  10.6*  HCT 32.2*  PLT 369   Recent Labs  Lab 12/27/21 1451  NA 127*  K 4.4  CL 95*  CO2 18*  BUN 20  CREATININE 1.40*  GLUCOSE 116*  CALCIUM 8.8*    Pertinent additional labs INR 1.4, PT 16.8, Na 127, Cr 1.40, AST 110, Hg 10.6, MCV 79.5, Trop 257-->210, LA 2.3-->1.7.   EKG: My own interpretation (not copied from electronic read) Mild sinus tachycardia, normal axis, prolong QTC    Imaging Studies Performed:  Imaging Study (ie. Chest x-ray) Impression from Radiologist:   CXR 12/27/21 IMPRESSION: 1. Cardiomegaly without failure. 2. No acute cardiopulmonary disease.   My Interpretation:  CXR 12/27/21 Cardiomegaly   12/27/21 IMPRESSION: 1. No acute intra-abdominal or pelvic pathology. No bowel obstruction. Normal appendix. 2. Hepatomegaly. 3. Partially visualized induration of the skin and subcutaneous soft tissues adjacent to the intergluteal cleft and superficial to the coccyx. The no definite drainable fluid collection identified by CT. This can be better evaluated with ultrasound to assess for possible drainage.   Celine Mans, MD 12/27/2021, 10:22 PM PGY-1, [facility]  FPTS Intern pager: [phone], text pages welcome Secure chat group [facility] Teaching Service

## 2021-12-27 NOTE — ED Notes (Signed)
Iv team at the bedside 

## 2021-12-27 NOTE — Progress Notes (Signed)
Pharmacy Antibiotic Note  Donald [name] is a 36 y.o. male for which pharmacy has been consulted for cefepime and vancomycin dosing for sepsis.  Patient with a history of mitral regurgitation s/p valve replacement on Coumadin, HF, AF and prior DVT, and Hx of hidradenitis and pyoderma gangrenosum . Patient presenting with weakness.  SCr 1.4 - 0.8 in April WBC 6.2; LA 2.3; T 101.3 F; HR 107>94; RR 18>24  Plan: Metronidazole per MD Cefepime 2g q8hr Vancomycin 1750 mg once then 1250 mg q12hr (eAUC 557) unless change in renal function Trend WBC, Fever, Renal function, & Clinical course F/u cultures, clinical course, WBC, fever De-escalate when able Levels at steady state  Height: 5\' 10"  (177.8 cm) Weight: 90.7 kg (200 lb) IBW/kg (Calculated) : 73  Temp (24hrs), Avg:101.3 F (38.5 C), Min:101.3 F (38.5 C), Max:101.3 F (38.5 C)  Recent Labs  Lab 12/27/21 1451  WBC 6.2  CREATININE 1.40*  LATICACIDVEN 2.3*    Estimated Creatinine Clearance: 85 mL/min (A) (by C-G formula based on SCr of 1.4 mg/dL (H)).    No Known Allergies  Antimicrobials this admission: cefepime 7/14 >>  vancomycin 7/14 >>  metronidazole 7/14 >>   Microbiology results: Pending  Thank you for allowing pharmacy to be a part of this patient's care.  8/14, PharmD, BCPS 12/27/2021 4:43 PM ED Clinical Pharmacist -  [phone]

## 2021-12-27 NOTE — ED Provider Notes (Signed)
[facility] EMERGENCY DEPARTMENT Provider Note   CSN: [id number] Arrival date & time: 12/27/21  1329     History {Add pertinent medical, surgical, social history, OB history to HPI:1} Chief Complaint  Patient presents with   Weakness    Donald [name] is a 36 y.o. male.  Pt with hx mitral valve replacement, pyoderma gangrenosum, presents with fever, and vomiting/diarrhea in the past 2-3 days. Symptoms acute onset, moderate, persistent. Notes ~ 3 episodes emesis, not bloody or bilious. Multiple episodes watery, non bloody diarrhea. Denies known ill contacts or bad food ingestion. No recent traveler or antibiotic use. Abd cramping discomfort, no focal abd pain. No dysuria or gu c/o. No cough or uri symptoms. No headache, no neck pain or stiffness. No sinus pain or drainage. Denies new skin lesions or sores. No extremity/foot or sacral area pain, swelling or drainage. States compliant w home meds.   The history is provided by the patient and medical records.  Weakness Associated symptoms: diarrhea, fever, nausea and vomiting   Associated symptoms: no chest pain, no cough, no dysuria, no headaches and no shortness of breath        Home Medications Prior to Admission medications   Medication Sig Start Date End Date Taking? Authorizing Provider  acetaminophen (TYLENOL) 325 MG tablet Take 2 tablets (650 mg total) by mouth every 4 (four) hours as needed for headache or mild pain. 06/04/21   Clegg, Amy D, NP  amiodarone (PACERONE) 200 MG tablet TAKE 1 TABLET BY MOUTH TWICE DAILY FOR  4  WEEKS,  THEN  DECREASE  TO  1  TABLET  DAILY 12/05/21   [name], MD  dapagliflozin propanediol (FARXIGA) 10 MG TABS tablet Take 1 tablet (10 mg total) by mouth daily. 10/09/21   Bensimhon, Bevelyn Buckles, MD  doxycycline (VIBRAMYCIN) 100 MG capsule Take 1 capsule (100 mg total) by mouth 2 (two) times daily. 11/03/21   Gwyneth Sprout, MD  furosemide (LASIX) 40 MG tablet Take 1  tablet (40 mg total) by mouth daily. 11/18/21   Bensimhon, Bevelyn Buckles, MD  losartan (COZAAR) 25 MG tablet Take 1 tablet (25 mg total) by mouth daily. 10/11/21   Bensimhon, Bevelyn Buckles, MD  oxyCODONE-acetaminophen (PERCOCET/ROXICET) 5-325 MG tablet Take 1 tablet by mouth every 6 (six) hours as needed for severe pain. 11/03/21   Gwyneth Sprout, MD  pantoprazole (PROTONIX) 40 MG tablet Take 1 tablet by mouth once daily 11/13/21   Laurey Morale, MD  potassium chloride SA (KLOR-CON M) 20 MEQ tablet Take 2 tablets (40 mEq total) by mouth daily. 10/11/21   Bensimhon, Bevelyn Buckles, MD  predniSONE (DELTASONE) 20 MG tablet Take 2 tablets (40 mg total) by mouth daily. Take 2 tablets (40 mg) daily for the next 5 days, then take 1 tablet (20 mg) daily for 5 days, then take 0.5 tablet (10mg ) for 4 days 11/03/21   11/05/21, MD  spironolactone (ALDACTONE) 25 MG tablet Take 1 tablet (25 mg total) by mouth daily. 11/13/21   Bensimhon, 11/15/21, MD  warfarin (COUMADIN) 2 MG tablet Take 1 - 2 tablets daily or as directed by coumadin clinic 10/08/21   Barrett, 10/10/21, PA-C      Allergies    Patient has no known allergies.    Review of Systems   Review of Systems  Constitutional:  Positive for fever.  HENT:  Negative for sore throat.   Eyes:  Negative for redness.  Respiratory:  Negative for cough  and shortness of breath.   Cardiovascular:  Negative for chest pain and leg swelling.  Gastrointestinal:  Positive for diarrhea, nausea and vomiting.  Genitourinary:  Negative for dysuria, flank pain, scrotal swelling and testicular pain.  Musculoskeletal:  Negative for back pain, neck pain and neck stiffness.  Skin:  Negative for rash.  Neurological:  Positive for weakness. Negative for headaches.  Hematological:        On coumadin, denies abnormal bruising or bleeding.   Psychiatric/Behavioral:  Negative for confusion.     Physical Exam Updated Vital Signs BP 111/82 (BP Location: Right Arm)   Pulse 74   Temp  (!) 101.3 F (38.5 C) (Oral)   Resp 16   Ht 1.778 m (5\' 10" )   Wt 90.7 kg   SpO2 100%   BMI 28.70 kg/m  Physical Exam Vitals and nursing note reviewed.  Constitutional:      Appearance: Normal appearance. He is well-developed.  HENT:     Head: Atraumatic.     Right Ear: Tympanic membrane normal.     Left Ear: Tympanic membrane normal.     Nose: Nose normal.     Mouth/Throat:     Mouth: Mucous membranes are moist.     Pharynx: Oropharynx is clear.  Eyes:     General: No scleral icterus.    Conjunctiva/sclera: Conjunctivae normal.     Pupils: Pupils are equal, round, and reactive to light.  Neck:     Trachea: No tracheal deviation.     Comments: No stiffness or rigidity, moves neck freely/comfortably in all directions.  Cardiovascular:     Rate and Rhythm: Normal rate and regular rhythm.     Pulses: Normal pulses.     Heart sounds: No murmur heard.    No friction rub. No gallop.     Comments: Mechanical S1.  Pulmonary:     Effort: Pulmonary effort is normal. No accessory muscle usage or respiratory distress.     Breath sounds: Normal breath sounds.  Abdominal:     General: Bowel sounds are normal. There is no distension.     Palpations: Abdomen is soft.     Tenderness: There is abdominal tenderness. There is no guarding.     Comments: Mild mid abd tenderness.   Genitourinary:    Comments: No cva tenderness. Musculoskeletal:        General: No swelling or tenderness.     Cervical back: Normal range of motion and neck supple. No rigidity.     Right lower leg: No edema.     Left lower leg: No edema.     Comments: No focal area of skin abscess or infection on bil extremity, back, sacral exam.   Lymphadenopathy:     Cervical: No cervical adenopathy.  Skin:    General: Skin is warm and dry.     Findings: No rash.  Neurological:     Mental Status: He is alert.     Comments: Alert, speech clear.   Psychiatric:        Mood and Affect: Mood normal.     ED Results /  Procedures / Treatments   Labs (all labs ordered are listed, but only abnormal results are displayed) Results for orders placed or performed during the hospital encounter of 12/27/21  Comprehensive metabolic panel  Result Value Ref Range   Sodium 127 (L) 135 - 145 mmol/L   Potassium 4.4 3.5 - 5.1 mmol/L   Chloride 95 (L) 98 - 111 mmol/L  CO2 18 (L) 22 - 32 mmol/L   Glucose, Bld 116 (H) 70 - 99 mg/dL   BUN 20 6 - 20 mg/dL   Creatinine, Ser 1.32 (H) 0.61 - 1.24 mg/dL   Calcium 8.8 (L) 8.9 - 10.3 mg/dL   Total Protein 44.0 (H) 6.5 - 8.1 g/dL   Albumin 3.0 (L) 3.5 - 5.0 g/dL   AST 102 (H) 15 - 41 U/L   ALT 41 0 - 44 U/L   Alkaline Phosphatase 112 38 - 126 U/L   Total Bilirubin 0.5 0.3 - 1.2 mg/dL   GFR, Estimated >72 >53 mL/min   Anion gap 14 5 - 15  CBC with Differential  Result Value Ref Range   WBC 6.2 4.0 - 10.5 K/uL   RBC 4.05 (L) 4.22 - 5.81 MIL/uL   Hemoglobin 10.6 (L) 13.0 - 17.0 g/dL   HCT 66.4 (L) 40.3 - 47.4 %   MCV 79.5 (L) 80.0 - 100.0 fL   MCH 26.2 26.0 - 34.0 pg   MCHC 32.9 30.0 - 36.0 g/dL   RDW 25.9 56.3 - 87.5 %   Platelets 369 150 - 400 K/uL   nRBC 0.0 0.0 - 0.2 %   Neutrophils Relative % 69 %   Neutro Abs 4.2 1.7 - 7.7 K/uL   Lymphocytes Relative 23 %   Lymphs Abs 1.4 0.7 - 4.0 K/uL   Monocytes Relative 5 %   Monocytes Absolute 0.3 0.1 - 1.0 K/uL   Eosinophils Relative 2 %   Eosinophils Absolute 0.2 0.0 - 0.5 K/uL   Basophils Relative 0 %   Basophils Absolute 0.0 0.0 - 0.1 K/uL   Immature Granulocytes 1 %   Abs Immature Granulocytes 0.04 0.00 - 0.07 K/uL  Protime-INR  Result Value Ref Range   Prothrombin Time 16.8 (H) 11.4 - 15.2 seconds   INR 1.4 (H) 0.8 - 1.2  Lactic acid, plasma  Result Value Ref Range   Lactic Acid, Venous 2.3 (HH) 0.5 - 1.9 mmol/L  Urinalysis, Routine w reflex microscopic  Result Value Ref Range   Color, Urine YELLOW YELLOW   APPearance HAZY (A) CLEAR   Specific Gravity, Urine 1.025 1.005 - 1.030   pH 5.0 5.0 - 8.0    Glucose, UA NEGATIVE NEGATIVE mg/dL   Hgb urine dipstick SMALL (A) NEGATIVE   Bilirubin Urine NEGATIVE NEGATIVE   Ketones, ur NEGATIVE NEGATIVE mg/dL   Protein, ur >=643 (A) NEGATIVE mg/dL   Nitrite NEGATIVE NEGATIVE   Leukocytes,Ua NEGATIVE NEGATIVE   RBC / HPF 0-5 0 - 5 RBC/hpf   WBC, UA 0-5 0 - 5 WBC/hpf   Bacteria, UA RARE (A) NONE SEEN   Squamous Epithelial / LPF 0-5 0 - 5   Mucus PRESENT    Hyaline Casts, UA PRESENT    Granular Casts, UA PRESENT   Brain natriuretic peptide  Result Value Ref Range   B Natriuretic Peptide >4,500.0 (H) 0.0 - 100.0 pg/mL  Troponin I (High Sensitivity)  Result Value Ref Range   Troponin I (High Sensitivity) 257 (HH) <18 ng/L   DG Chest 2 View  Result Date: 12/27/2021 CLINICAL DATA:  Cough and fever. EXAM: CHEST - 2 VIEW COMPARISON:  One-view chest x-ray 07/25/2021 FINDINGS: The heart is enlarged. No edema or effusion is present. No focal airspace disease is present. Mitral valve prosthesis noted. The visualized soft tissues and bony thorax are unremarkable. IMPRESSION: 1. Cardiomegaly without failure. 2. No acute cardiopulmonary disease. Electronically Signed   By: Marin Roberts  M.D.   On: 12/27/2021 15:04      EKG None  Radiology DG Chest 2 View  Result Date: 12/27/2021 CLINICAL DATA:  Cough and fever. EXAM: CHEST - 2 VIEW COMPARISON:  One-view chest x-ray 07/25/2021 FINDINGS: The heart is enlarged. No edema or effusion is present. No focal airspace disease is present. Mitral valve prosthesis noted. The visualized soft tissues and bony thorax are unremarkable. IMPRESSION: 1. Cardiomegaly without failure. 2. No acute cardiopulmonary disease. Electronically Signed   By: Marin Roberts M.D.   On: 12/27/2021 15:04    Procedures Procedures  {Document cardiac monitor, telemetry assessment procedure when appropriate:1}  Medications Ordered in ED Medications  lactated ringers bolus 1,000 mL (has no administration in time range)   ceFEPIme (MAXIPIME) 2 g in sodium chloride 0.9 % 100 mL IVPB (has no administration in time range)  metroNIDAZOLE (FLAGYL) IVPB 500 mg (has no administration in time range)  vancomycin (VANCOCIN) IVPB 1000 mg/200 mL premix (has no administration in time range)  acetaminophen (TYLENOL) tablet 650 mg (650 mg Oral Given 12/27/21 1347)    ED Course/ Medical Decision Making/ A&P                           Medical Decision Making Problems Addressed: Acute febrile illness: acute illness or injury with systemic symptoms that poses a threat to life or bodily functions Anticoagulated on Coumadin: chronic illness or injury Cardiomyopathy, unspecified type (HCC): chronic illness or injury that poses a threat to life or bodily functions Dehydration: acute illness or injury with systemic symptoms that poses a threat to life or bodily functions Elevated lactic acid level: acute illness or injury Elevated troponin: acute illness or injury Generalized weakness: acute illness or injury with systemic symptoms H/O mitral valve replacement: chronic illness or injury Nausea vomiting and diarrhea: acute illness or injury with systemic symptoms Pyoderma gangrenosum: chronic illness or injury  Amount and/or Complexity of Data Reviewed Independent Historian:     Details: family, hx External Data Reviewed: notes. Labs: ordered. Decision-making details documented in ED Course. Radiology: ordered and independent interpretation performed. Decision-making details documented in ED Course. ECG/medicine tests: ordered and independent interpretation performed. Decision-making details documented in ED Course. Discussion of management or test interpretation with external provider(s): Admitting medicine team.   Risk OTC drugs. Prescription drug management. Decision regarding hospitalization.   Iv ns. Continuous pulse ox and cardiac monitoring. Labs ordered/sent. Imaging ordered.   Reviewed nursing notes and prior  charts for additional history. External reports reviewed. Additional history from:  Cardiac monitor: sinus rhythm, rate 76.  Cultures sent. Iv abx given.   Labs reviewed/interpreted by me - initial lactate high, ?sepsis vs dehydration  - ivf bolus given.   Xrays reviewed/interpreted by me - no def pna.   CT reviewed/interpreted by me - pnd.   Additional labs reviewed/interpreted by me - trop is elev. Pt denies chest pain.   Given fever, nvd, elevated lactate, high trop, gen weakness - will admit for ivf, f/u cultures, etc. Inr is subtherapeutic and appears to have been for some time now. Discussed dose w pt, will order coumadin dose.   Unassigned medicine to admit.      {Document critical care time when appropriate:1} {Document review of labs and clinical decision tools ie heart score, Chads2Vasc2 etc:1}  {Document your independent review of radiology images, and any outside records:1} {Document your discussion with family members, caretakers, and with consultants:1} {Document social determinants of health  affecting pt's care:1} {Document your decision making why or why not admission, treatments were needed:1} Final Clinical Impression(s) / ED Diagnoses Final diagnoses:  None    Rx / DC Orders ED Discharge Orders     None

## 2021-12-27 NOTE — ED Notes (Signed)
Pt to ct 

## 2021-12-27 NOTE — Assessment & Plan Note (Deleted)
Normal sinus rhythm, regular rate on exam. -Continue home Amiodarone 100 BID -Anticoagulation per phamarcy

## 2021-12-27 NOTE — Assessment & Plan Note (Deleted)
Cr 1.40 from baseline of ~1.0. Likely prerenal due to volume depletion in setting of vomiting and diarrhea. -S/p bolus -Will continue gentle IVF hydration -CMP in AM -Full Liquid diet

## 2021-12-27 NOTE — ED Triage Notes (Signed)
Pt c/o fever, nausea, vomiting, diarrhea and weakness x 2 days.

## 2021-12-27 NOTE — Assessment & Plan Note (Deleted)
Patient hx of mitral valve replacement, and chronic heart failure. PTT 1.4 on admission (goal 2.5-3.5 in setting of MVR). Last ECHO 10/11/21 EF 20-25%. Appreciable JVD on exam, BNP elevated >4500 (can be elevated in the setting of low filling pressures), otherwise exam not concerning for volume overload. Cr 1.40 Unlikely acute heart failure exacerbation, in setting of significant diarrhea/vomiting. EKG significant for prolonged QT. -Follow-up ECHO -Consult cardiology in AM -Holding home Losartan, Furosemide, Farxiga, and Spironolactone in setting of elevated Cr and dehydration -Patient on Warfarin at home, bridging with Lovenox per pharmacy

## 2021-12-28 ENCOUNTER — Inpatient Hospital Stay (HOSPITAL_COMMUNITY): Payer: Medicaid Other

## 2021-12-28 ENCOUNTER — Observation Stay (HOSPITAL_COMMUNITY): Payer: Medicaid Other

## 2021-12-28 ENCOUNTER — Observation Stay: Payer: Self-pay

## 2021-12-28 DIAGNOSIS — R509 Fever, unspecified: Secondary | ICD-10-CM | POA: Insufficient documentation

## 2021-12-28 DIAGNOSIS — I480 Paroxysmal atrial fibrillation: Secondary | ICD-10-CM

## 2021-12-28 DIAGNOSIS — I5043 Acute on chronic combined systolic (congestive) and diastolic (congestive) heart failure: Secondary | ICD-10-CM | POA: Diagnosis not present

## 2021-12-28 DIAGNOSIS — R112 Nausea with vomiting, unspecified: Secondary | ICD-10-CM | POA: Diagnosis not present

## 2021-12-28 DIAGNOSIS — L88 Pyoderma gangrenosum: Secondary | ICD-10-CM

## 2021-12-28 DIAGNOSIS — I38 Endocarditis, valve unspecified: Secondary | ICD-10-CM | POA: Diagnosis not present

## 2021-12-28 DIAGNOSIS — I429 Cardiomyopathy, unspecified: Secondary | ICD-10-CM

## 2021-12-28 DIAGNOSIS — I3489 Other nonrheumatic mitral valve disorders: Secondary | ICD-10-CM

## 2021-12-28 DIAGNOSIS — R7401 Elevation of levels of liver transaminase levels: Secondary | ICD-10-CM | POA: Diagnosis not present

## 2021-12-28 DIAGNOSIS — M60022 Infective myositis, left upper arm: Secondary | ICD-10-CM | POA: Diagnosis not present

## 2021-12-28 DIAGNOSIS — E872 Acidosis, unspecified: Secondary | ICD-10-CM | POA: Diagnosis not present

## 2021-12-28 DIAGNOSIS — E874 Mixed disorder of acid-base balance: Secondary | ICD-10-CM | POA: Diagnosis not present

## 2021-12-28 DIAGNOSIS — I059 Rheumatic mitral valve disease, unspecified: Secondary | ICD-10-CM | POA: Diagnosis not present

## 2021-12-28 DIAGNOSIS — I5082 Biventricular heart failure: Secondary | ICD-10-CM

## 2021-12-28 DIAGNOSIS — Z952 Presence of prosthetic heart valve: Secondary | ICD-10-CM | POA: Diagnosis not present

## 2021-12-28 DIAGNOSIS — G9340 Encephalopathy, unspecified: Secondary | ICD-10-CM | POA: Diagnosis not present

## 2021-12-28 DIAGNOSIS — J9601 Acute respiratory failure with hypoxia: Secondary | ICD-10-CM | POA: Diagnosis not present

## 2021-12-28 DIAGNOSIS — N189 Chronic kidney disease, unspecified: Secondary | ICD-10-CM | POA: Diagnosis not present

## 2021-12-28 DIAGNOSIS — Z7901 Long term (current) use of anticoagulants: Secondary | ICD-10-CM | POA: Diagnosis not present

## 2021-12-28 DIAGNOSIS — A419 Sepsis, unspecified organism: Secondary | ICD-10-CM

## 2021-12-28 DIAGNOSIS — R7989 Other specified abnormal findings of blood chemistry: Secondary | ICD-10-CM | POA: Insufficient documentation

## 2021-12-28 DIAGNOSIS — J9690 Respiratory failure, unspecified, unspecified whether with hypoxia or hypercapnia: Secondary | ICD-10-CM | POA: Diagnosis not present

## 2021-12-28 DIAGNOSIS — R0682 Tachypnea, not elsewhere classified: Secondary | ICD-10-CM | POA: Diagnosis not present

## 2021-12-28 DIAGNOSIS — R778 Other specified abnormalities of plasma proteins: Secondary | ICD-10-CM | POA: Diagnosis not present

## 2021-12-28 DIAGNOSIS — I4892 Unspecified atrial flutter: Secondary | ICD-10-CM | POA: Diagnosis not present

## 2021-12-28 DIAGNOSIS — Y831 Surgical operation with implant of artificial internal device as the cause of abnormal reaction of the patient, or of later complication, without mention of misadventure at the time of the procedure: Secondary | ICD-10-CM | POA: Diagnosis present

## 2021-12-28 DIAGNOSIS — Z9911 Dependence on respirator [ventilator] status: Secondary | ICD-10-CM | POA: Diagnosis not present

## 2021-12-28 DIAGNOSIS — E871 Hypo-osmolality and hyponatremia: Secondary | ICD-10-CM | POA: Diagnosis not present

## 2021-12-28 DIAGNOSIS — K761 Chronic passive congestion of liver: Secondary | ICD-10-CM | POA: Diagnosis not present

## 2021-12-28 DIAGNOSIS — N179 Acute kidney failure, unspecified: Secondary | ICD-10-CM | POA: Diagnosis not present

## 2021-12-28 DIAGNOSIS — R531 Weakness: Secondary | ICD-10-CM

## 2021-12-28 DIAGNOSIS — Z4682 Encounter for fitting and adjustment of non-vascular catheter: Secondary | ICD-10-CM | POA: Diagnosis not present

## 2021-12-28 DIAGNOSIS — E860 Dehydration: Secondary | ICD-10-CM | POA: Insufficient documentation

## 2021-12-28 DIAGNOSIS — M7989 Other specified soft tissue disorders: Secondary | ICD-10-CM | POA: Diagnosis not present

## 2021-12-28 DIAGNOSIS — D509 Iron deficiency anemia, unspecified: Secondary | ICD-10-CM | POA: Diagnosis not present

## 2021-12-28 DIAGNOSIS — G8194 Hemiplegia, unspecified affecting left nondominant side: Secondary | ICD-10-CM | POA: Diagnosis not present

## 2021-12-28 DIAGNOSIS — I5023 Acute on chronic systolic (congestive) heart failure: Secondary | ICD-10-CM | POA: Diagnosis not present

## 2021-12-28 DIAGNOSIS — I5022 Chronic systolic (congestive) heart failure: Secondary | ICD-10-CM | POA: Diagnosis not present

## 2021-12-28 DIAGNOSIS — I63411 Cerebral infarction due to embolism of right middle cerebral artery: Secondary | ICD-10-CM | POA: Diagnosis not present

## 2021-12-28 DIAGNOSIS — R570 Cardiogenic shock: Secondary | ICD-10-CM

## 2021-12-28 DIAGNOSIS — K529 Noninfective gastroenteritis and colitis, unspecified: Secondary | ICD-10-CM | POA: Diagnosis not present

## 2021-12-28 DIAGNOSIS — K7200 Acute and subacute hepatic failure without coma: Secondary | ICD-10-CM | POA: Diagnosis not present

## 2021-12-28 DIAGNOSIS — G9341 Metabolic encephalopathy: Secondary | ICD-10-CM | POA: Diagnosis not present

## 2021-12-28 DIAGNOSIS — I5020 Unspecified systolic (congestive) heart failure: Secondary | ICD-10-CM | POA: Diagnosis not present

## 2021-12-28 DIAGNOSIS — R6521 Severe sepsis with septic shock: Secondary | ICD-10-CM | POA: Diagnosis not present

## 2021-12-28 DIAGNOSIS — Z20822 Contact with and (suspected) exposure to covid-19: Secondary | ICD-10-CM | POA: Diagnosis not present

## 2021-12-28 DIAGNOSIS — I330 Acute and subacute infective endocarditis: Secondary | ICD-10-CM | POA: Diagnosis not present

## 2021-12-28 DIAGNOSIS — M79602 Pain in left arm: Secondary | ICD-10-CM | POA: Diagnosis not present

## 2021-12-28 DIAGNOSIS — N170 Acute kidney failure with tubular necrosis: Secondary | ICD-10-CM | POA: Diagnosis not present

## 2021-12-28 DIAGNOSIS — I110 Hypertensive heart disease with heart failure: Secondary | ICD-10-CM | POA: Diagnosis not present

## 2021-12-28 DIAGNOSIS — I639 Cerebral infarction, unspecified: Secondary | ICD-10-CM | POA: Diagnosis not present

## 2021-12-28 DIAGNOSIS — T826XXA Infection and inflammatory reaction due to cardiac valve prosthesis, initial encounter: Secondary | ICD-10-CM | POA: Diagnosis not present

## 2021-12-28 DIAGNOSIS — R059 Cough, unspecified: Secondary | ICD-10-CM | POA: Diagnosis not present

## 2021-12-28 DIAGNOSIS — R579 Shock, unspecified: Secondary | ICD-10-CM | POA: Insufficient documentation

## 2021-12-28 HISTORY — DX: Shock, unspecified: R57.9

## 2021-12-28 LAB — COMPREHENSIVE METABOLIC PANEL
ALT: 73 U/L — ABNORMAL HIGH (ref 0–44)
ALT: 623 U/L — ABNORMAL HIGH (ref 0–44)
AST: 258 U/L — ABNORMAL HIGH (ref 15–41)
AST: 2267 U/L — ABNORMAL HIGH (ref 15–41)
Albumin: 2.8 g/dL — ABNORMAL LOW (ref 3.5–5.0)
Albumin: 2.5 g/dL — ABNORMAL LOW (ref 3.5–5.0)
Alkaline Phosphatase: 125 U/L (ref 38–126)
Alkaline Phosphatase: 127 U/L — ABNORMAL HIGH (ref 38–126)
Anion gap: 21 — ABNORMAL HIGH (ref 5–15)
BUN: 26 mg/dL — ABNORMAL HIGH (ref 6–20)
BUN: 37 mg/dL — ABNORMAL HIGH (ref 6–20)
CO2: <7 mmol/L — ABNORMAL LOW (ref 22–32)
CO2: 16 mmol/L — ABNORMAL LOW (ref 22–32)
Calcium: 8.8 mg/dL — ABNORMAL LOW (ref 8.9–10.3)
Calcium: 8 mg/dL — ABNORMAL LOW (ref 8.9–10.3)
Chloride: 97 mmol/L — ABNORMAL LOW (ref 98–111)
Chloride: 94 mmol/L — ABNORMAL LOW (ref 98–111)
Creatinine, Ser: 2.15 mg/dL — ABNORMAL HIGH (ref 0.61–1.24)
Creatinine, Ser: 2.59 mg/dL — ABNORMAL HIGH (ref 0.61–1.24)
GFR, Estimated: 41 mL/min — ABNORMAL LOW (ref >60)
GFR, Estimated: 33 mL/min — ABNORMAL LOW (ref >60)
Glucose, Bld: <20 mg/dL — CL (ref 70–99)
Glucose, Bld: 84 mg/dL (ref 70–99)
Potassium: 5.7 mmol/L — ABNORMAL HIGH (ref 3.5–5.1)
Potassium: 4.5 mmol/L (ref 3.5–5.1)
Sodium: 131 mmol/L — ABNORMAL LOW (ref 135–145)
Sodium: 131 mmol/L — ABNORMAL LOW (ref 135–145)
Total Bilirubin: 1.2 mg/dL (ref 0.3–1.2)
Total Bilirubin: 1.8 mg/dL — ABNORMAL HIGH (ref 0.3–1.2)
Total Protein: 9.7 g/dL — ABNORMAL HIGH (ref 6.5–8.1)
Total Protein: 8.2 g/dL — ABNORMAL HIGH (ref 6.5–8.1)

## 2021-12-28 LAB — POCT I-STAT 7, (LYTES, BLD GAS, ICA,H+H)
Acid-base deficit: 22 mmol/L — ABNORMAL HIGH (ref 0.0–2.0)
Acid-base deficit: 11 mmol/L — ABNORMAL HIGH (ref 0.0–2.0)
Bicarbonate: 6.8 mmol/L — ABNORMAL LOW (ref 20.0–28.0)
Bicarbonate: 12.4 mmol/L — ABNORMAL LOW (ref 20.0–28.0)
Calcium, Ion: 1.12 mmol/L — ABNORMAL LOW (ref 1.15–1.40)
Calcium, Ion: 0.8 mmol/L — CL (ref 1.15–1.40)
HCT: 32 % — ABNORMAL LOW (ref 39.0–52.0)
HCT: 39 % (ref 39.0–52.0)
Hemoglobin: 10.9 g/dL — ABNORMAL LOW (ref 13.0–17.0)
Hemoglobin: 13.3 g/dL (ref 13.0–17.0)
O2 Saturation: 100 %
O2 Saturation: 100 %
Patient temperature: 39.1
Potassium: 5.3 mmol/L — ABNORMAL HIGH (ref 3.5–5.1)
Potassium: 5.8 mmol/L — ABNORMAL HIGH (ref 3.5–5.1)
Sodium: 129 mmol/L — ABNORMAL LOW (ref 135–145)
Sodium: 153 mmol/L — ABNORMAL HIGH (ref 135–145)
TCO2: 7 mmol/L — ABNORMAL LOW (ref 22–32)
TCO2: 13 mmol/L — ABNORMAL LOW (ref 22–32)
pCO2 arterial: 23.4 mmHg — ABNORMAL LOW (ref 32–48)
pCO2 arterial: 24.3 mmHg — ABNORMAL LOW (ref 32–48)
pH, Arterial: 7.07 — CL (ref 7.35–7.45)
pH, Arterial: 7.326 — ABNORMAL LOW (ref 7.35–7.45)
pO2, Arterial: 461 mmHg — ABNORMAL HIGH (ref 83–108)
pO2, Arterial: 240 mmHg — ABNORMAL HIGH (ref 83–108)

## 2021-12-28 LAB — IRON AND TIBC
Iron: 32 ug/dL — ABNORMAL LOW (ref 45–182)
Saturation Ratios: 11 % — ABNORMAL LOW (ref 17.9–39.5)
TIBC: 298 ug/dL (ref 250–450)
UIBC: 266 ug/dL

## 2021-12-28 LAB — BASIC METABOLIC PANEL
Anion gap: 29 — ABNORMAL HIGH (ref 5–15)
Anion gap: 20 — ABNORMAL HIGH (ref 5–15)
BUN: 28 mg/dL — ABNORMAL HIGH (ref 6–20)
BUN: 33 mg/dL — ABNORMAL HIGH (ref 6–20)
CO2: 9 mmol/L — ABNORMAL LOW (ref 22–32)
CO2: 13 mmol/L — ABNORMAL LOW (ref 22–32)
Calcium: 8.4 mg/dL — ABNORMAL LOW (ref 8.9–10.3)
Calcium: 8.2 mg/dL — ABNORMAL LOW (ref 8.9–10.3)
Chloride: 93 mmol/L — ABNORMAL LOW (ref 98–111)
Chloride: 98 mmol/L (ref 98–111)
Creatinine, Ser: 2.54 mg/dL — ABNORMAL HIGH (ref 0.61–1.24)
Creatinine, Ser: 2.66 mg/dL — ABNORMAL HIGH (ref 0.61–1.24)
GFR, Estimated: 33 mL/min — ABNORMAL LOW (ref >60)
GFR, Estimated: 32 mL/min — ABNORMAL LOW (ref >60)
Glucose, Bld: 159 mg/dL — ABNORMAL HIGH (ref 70–99)
Glucose, Bld: 74 mg/dL (ref 70–99)
Potassium: 5.6 mmol/L — ABNORMAL HIGH (ref 3.5–5.1)
Potassium: 5.8 mmol/L — ABNORMAL HIGH (ref 3.5–5.1)
Sodium: 131 mmol/L — ABNORMAL LOW (ref 135–145)
Sodium: 131 mmol/L — ABNORMAL LOW (ref 135–145)

## 2021-12-28 LAB — TROPONIN I (HIGH SENSITIVITY)
Troponin I (High Sensitivity): 350 ng/L (ref <18)
Troponin I (High Sensitivity): 978 ng/L (ref <18)

## 2021-12-28 LAB — POCT I-STAT, CHEM 8
BUN: 33 mg/dL — ABNORMAL HIGH (ref 6–20)
Calcium, Ion: 0.99 mmol/L — ABNORMAL LOW (ref 1.15–1.40)
Chloride: 100 mmol/L (ref 98–111)
Creatinine, Ser: 2.7 mg/dL — ABNORMAL HIGH (ref 0.61–1.24)
Glucose, Bld: 76 mg/dL (ref 70–99)
HCT: 34 % — ABNORMAL LOW (ref 39.0–52.0)
Hemoglobin: 11.6 g/dL — ABNORMAL LOW (ref 13.0–17.0)
Potassium: 4.8 mmol/L (ref 3.5–5.1)
Sodium: 134 mmol/L — ABNORMAL LOW (ref 135–145)
TCO2: 17 mmol/L — ABNORMAL LOW (ref 22–32)

## 2021-12-28 LAB — GLUCOSE, CAPILLARY
Glucose-Capillary: 101 mg/dL — ABNORMAL HIGH (ref 70–99)
Glucose-Capillary: 75 mg/dL (ref 70–99)
Glucose-Capillary: 76 mg/dL (ref 70–99)
Glucose-Capillary: 77 mg/dL (ref 70–99)

## 2021-12-28 LAB — COOXEMETRY PANEL
Carboxyhemoglobin: 0.7 % (ref 0.5–1.5)
Carboxyhemoglobin: 1.5 % (ref 0.5–1.5)
Methemoglobin: <0.7 % (ref 0.0–1.5)
Methemoglobin: <0.7 % (ref 0.0–1.5)
O2 Saturation: 64.1 %
O2 Saturation: 58.1 %
Total hemoglobin: 9.9 g/dL — ABNORMAL LOW (ref 12.0–16.0)
Total hemoglobin: 10.5 g/dL — ABNORMAL LOW (ref 12.0–16.0)

## 2021-12-28 LAB — LACTIC ACID, PLASMA
Lactic Acid, Venous: >9 mmol/L (ref 0.5–1.9)
Lactic Acid, Venous: >9 mmol/L (ref 0.5–1.9)
Lactic Acid, Venous: >9 mmol/L (ref 0.5–1.9)
Lactic Acid, Venous: >9 mmol/L (ref 0.5–1.9)
Lactic Acid, Venous: 6.5 mmol/L (ref 0.5–1.9)

## 2021-12-28 LAB — MAGNESIUM: Magnesium: 2.4 mg/dL (ref 1.7–2.4)

## 2021-12-28 LAB — PROTIME-INR
INR: 2.2 — ABNORMAL HIGH (ref 0.8–1.2)
Prothrombin Time: 24.5 seconds — ABNORMAL HIGH (ref 11.4–15.2)

## 2021-12-28 LAB — ECHOCARDIOGRAM COMPLETE
Area-P 1/2: 6.37 cm2
Height: 70 in
MV VTI: 1.17 cm2
S' Lateral: 5.9 cm
Weight: 3294.55 oz

## 2021-12-28 LAB — FERRITIN: Ferritin: 1116 ng/mL — ABNORMAL HIGH (ref 24–336)

## 2021-12-28 LAB — PROCALCITONIN: Procalcitonin: 17.21 ng/mL

## 2021-12-28 MED ORDER — SODIUM BICARBONATE 8.4 % IV SOLN
100.0000 meq | Freq: Once | INTRAVENOUS | Status: AC
  Administered 2021-12-28: 100 meq via INTRAVENOUS

## 2021-12-28 MED ORDER — ETOMIDATE 2 MG/ML IV SOLN
10.0000 mg | Freq: Once | INTRAVENOUS | Status: AC
  Administered 2021-12-28: 10 mg via INTRAVENOUS

## 2021-12-28 MED ORDER — FENTANYL CITRATE PF 50 MCG/ML IJ SOSY
50.0000 ug | PREFILLED_SYRINGE | INTRAMUSCULAR | Status: DC | PRN
  Administered 2021-12-28 – 2021-12-30 (×7): 50 ug via INTRAVENOUS
  Administered 2021-12-30: 100 ug via INTRAVENOUS
  Administered 2021-12-30 – 2021-12-31 (×6): 50 ug via INTRAVENOUS
  Administered 2021-12-31 – 2022-01-01 (×3): 100 ug via INTRAVENOUS
  Administered 2022-01-01: 50 ug via INTRAVENOUS
  Filled 2021-12-28 (×2): qty 1
  Filled 2021-12-28: qty 2
  Filled 2021-12-28: qty 1
  Filled 2021-12-28: qty 4
  Filled 2021-12-28: qty 1
  Filled 2021-12-28: qty 2
  Filled 2021-12-28 (×6): qty 1
  Filled 2021-12-28: qty 2
  Filled 2021-12-28 (×2): qty 1
  Filled 2021-12-28: qty 2
  Filled 2021-12-28 (×2): qty 1

## 2021-12-28 MED ORDER — DEXTROSE 50 % IV SOLN
INTRAVENOUS | Status: AC
  Administered 2021-12-28: 50 mL
  Filled 2021-12-28: qty 50

## 2021-12-28 MED ORDER — MIDAZOLAM HCL 2 MG/2ML IJ SOLN
2.0000 mg | Freq: Once | INTRAMUSCULAR | Status: AC
  Administered 2021-12-28: 2 mg via INTRAVENOUS
  Filled 2021-12-28: qty 2

## 2021-12-28 MED ORDER — SODIUM ZIRCONIUM CYCLOSILICATE 10 G PO PACK
10.0000 g | PACK | Freq: Once | ORAL | Status: AC
Start: 2021-12-28 — End: 2021-12-28
  Administered 2021-12-28: 10 g
  Filled 2021-12-28: qty 1

## 2021-12-28 MED ORDER — MILRINONE LACTATE IN DEXTROSE 20-5 MG/100ML-% IV SOLN
INTRAVENOUS | Status: AC
  Administered 2021-12-28: 0.25 ug/kg/min via INTRAVENOUS
  Filled 2021-12-28: qty 100

## 2021-12-28 MED ORDER — MIDAZOLAM HCL 2 MG/2ML IJ SOLN
INTRAMUSCULAR | Status: AC
  Administered 2021-12-28: 2 mg via INTRAVENOUS
  Filled 2021-12-28: qty 2

## 2021-12-28 MED ORDER — MILRINONE LACTATE IN DEXTROSE 20-5 MG/100ML-% IV SOLN: 0.2500 ug/kg/min | INTRAVENOUS | Status: DC

## 2021-12-28 MED ORDER — POLYETHYLENE GLYCOL 3350 17 G PO PACK
17.0000 g | PACK | Freq: Every day | ORAL | Status: DC
  Administered 2021-12-28 – 2021-12-31 (×4): 17 g
  Filled 2021-12-28 (×4): qty 1

## 2021-12-28 MED ORDER — MIDAZOLAM HCL 2 MG/2ML IJ SOLN: 2.0000 mg | Freq: Once | INTRAMUSCULAR | Status: AC

## 2021-12-28 MED ORDER — ACETAMINOPHEN 325 MG PO TABS
650.0000 mg | ORAL_TABLET | ORAL | Status: DC | PRN
  Administered 2021-12-29: 650 mg
  Filled 2021-12-28 (×2): qty 2

## 2021-12-28 MED ORDER — EPINEPHRINE 1 MG/10ML IJ SOSY: 0.5000 mg | PREFILLED_SYRINGE | Freq: Once | INTRAMUSCULAR | Status: AC

## 2021-12-28 MED ORDER — VANCOMYCIN VARIABLE DOSE PER UNSTABLE RENAL FUNCTION (PHARMACIST DOSING): Status: DC

## 2021-12-28 MED ORDER — DEXTROSE 50 % IV SOLN: 1.0000 | Freq: Once | INTRAVENOUS | Status: AC

## 2021-12-28 MED ORDER — DIPHENHYDRAMINE HCL 50 MG/ML IJ SOLN
25.0000 mg | Freq: Once | INTRAMUSCULAR | Status: AC
  Administered 2021-12-28: 25 mg via INTRAVENOUS
  Filled 2021-12-28: qty 1

## 2021-12-28 MED ORDER — CHLORHEXIDINE GLUCONATE CLOTH 2 % EX PADS
6.0000 | MEDICATED_PAD | Freq: Every day | CUTANEOUS | Status: DC
  Administered 2021-12-28 – 2022-01-16 (×20): 6 via TOPICAL

## 2021-12-28 MED ORDER — SODIUM BICARBONATE 8.4 % IV SOLN
100.0000 meq | Freq: Once | INTRAVENOUS | Status: AC
Start: 2021-12-28 — End: 2021-12-28
  Administered 2021-12-28: 100 meq via INTRAVENOUS

## 2021-12-28 MED ORDER — SODIUM CHLORIDE 0.9 % IV SOLN
2.0000 g | Freq: Two times a day (BID) | INTRAVENOUS | Status: DC
  Administered 2021-12-28 – 2021-12-29 (×3): 2 g via INTRAVENOUS
  Filled 2021-12-28 (×2): qty 12.5

## 2021-12-28 MED ORDER — DEXMEDETOMIDINE HCL IN NACL 400 MCG/100ML IV SOLN
0.0000 ug/kg/h | INTRAVENOUS | Status: DC
  Administered 2021-12-28: 0 ug/kg/h via INTRAVENOUS
  Administered 2021-12-28: 1.2 ug/kg/h via INTRAVENOUS
  Filled 2021-12-28 (×2): qty 100

## 2021-12-28 MED ORDER — HEPARIN (PORCINE) 25000 UT/250ML-% IV SOLN
1750.0000 [IU]/h | INTRAVENOUS | Status: DC
  Administered 2021-12-28: 1250 [IU]/h via INTRAVENOUS
  Administered 2021-12-29: 1400 [IU]/h via INTRAVENOUS
  Administered 2021-12-30: 1950 [IU]/h via INTRAVENOUS
  Administered 2021-12-30: 2100 [IU]/h via INTRAVENOUS
  Administered 2021-12-31: 2400 [IU]/h via INTRAVENOUS
  Administered 2021-12-31: 2600 [IU]/h via INTRAVENOUS
  Administered 2022-01-01: 2750 [IU]/h via INTRAVENOUS
  Administered 2022-01-01: 2700 [IU]/h via INTRAVENOUS
  Administered 2022-01-02: 2300 [IU]/h via INTRAVENOUS
  Administered 2022-01-02: 2700 [IU]/h via INTRAVENOUS
  Administered 2022-01-03 – 2022-01-04 (×4): 2300 [IU]/h via INTRAVENOUS
  Administered 2022-01-05: 1750 [IU]/h via INTRAVENOUS
  Administered 2022-01-05: 2300 [IU]/h via INTRAVENOUS
  Administered 2022-01-06 – 2022-01-07 (×3): 1750 [IU]/h via INTRAVENOUS
  Filled 2021-12-28 (×20): qty 250

## 2021-12-28 MED ORDER — SODIUM CHLORIDE 0.9 % IV SOLN
12.5000 mg | Freq: Three times a day (TID) | INTRAVENOUS | Status: DC | PRN
  Administered 2021-12-28: 12.5 mg via INTRAVENOUS
  Filled 2021-12-28: qty 0.5

## 2021-12-28 MED ORDER — PROPOFOL 1000 MG/100ML IV EMUL
5.0000 ug/kg/min | INTRAVENOUS | Status: DC
  Administered 2021-12-28: 5 ug/kg/min via INTRAVENOUS
  Administered 2021-12-28: 80 ug/kg/min via INTRAVENOUS
  Administered 2021-12-29: 10 ug/kg/min via INTRAVENOUS
  Administered 2021-12-30 (×2): 25 ug/kg/min via INTRAVENOUS
  Administered 2021-12-30: 10 ug/kg/min via INTRAVENOUS
  Administered 2021-12-31: 60 ug/kg/min via INTRAVENOUS
  Administered 2021-12-31: 30 ug/kg/min via INTRAVENOUS
  Administered 2021-12-31: 60 ug/kg/min via INTRAVENOUS
  Administered 2021-12-31 (×2): 25 ug/kg/min via INTRAVENOUS
  Administered 2022-01-01: 35 ug/kg/min via INTRAVENOUS
  Filled 2021-12-28 (×15): qty 100

## 2021-12-28 MED ORDER — ORAL CARE MOUTH RINSE: 15.0000 mL | OROMUCOSAL | Status: DC | PRN

## 2021-12-28 MED ORDER — ORAL CARE MOUTH RINSE
15.0000 mL | OROMUCOSAL | Status: DC
  Administered 2021-12-28 – 2022-01-01 (×47): 15 mL via OROMUCOSAL

## 2021-12-28 MED ORDER — DOCUSATE SODIUM 50 MG/5ML PO LIQD: 100.0000 mg | Freq: Two times a day (BID) | ORAL | Status: DC

## 2021-12-28 MED ORDER — PANTOPRAZOLE 2 MG/ML SUSPENSION
40.0000 mg | Freq: Every day | ORAL | Status: DC
  Administered 2021-12-28 – 2022-01-01 (×5): 40 mg
  Filled 2021-12-28 (×5): qty 20

## 2021-12-28 MED ORDER — DOCUSATE SODIUM 50 MG/5ML PO LIQD: 100.0000 mg | Freq: Two times a day (BID) | ORAL | Status: DC | PRN

## 2021-12-28 MED ORDER — FUROSEMIDE 10 MG/ML IJ SOLN
15.0000 mg/h | INTRAVENOUS | Status: DC
  Administered 2021-12-28: 15 mg/h via INTRAVENOUS
  Filled 2021-12-28: qty 20

## 2021-12-28 MED ORDER — DEXTROSE 50 % IV SOLN
INTRAVENOUS | Status: AC
  Administered 2021-12-28: 50 mL via INTRAVENOUS
  Filled 2021-12-28: qty 50

## 2021-12-28 MED ORDER — EPINEPHRINE 1 MG/10ML IJ SOSY
PREFILLED_SYRINGE | INTRAMUSCULAR | Status: AC
  Administered 2021-12-28: 0.5 mg via INTRAVENOUS
  Filled 2021-12-28: qty 10

## 2021-12-28 MED ORDER — CALCIUM GLUCONATE-NACL 1-0.675 GM/50ML-% IV SOLN
1.0000 g | Freq: Once | INTRAVENOUS | Status: AC
Start: 2021-12-28 — End: 2021-12-28
  Administered 2021-12-28: 1000 mg via INTRAVENOUS
  Filled 2021-12-28: qty 50

## 2021-12-28 MED ORDER — MILRINONE LACTATE IN DEXTROSE 20-5 MG/100ML-% IV SOLN
0.1250 ug/kg/min | INTRAVENOUS | Status: DC
  Administered 2021-12-28: 0.375 ug/kg/min via INTRAVENOUS
  Administered 2021-12-30 – 2022-01-04 (×4): 0.125 ug/kg/min via INTRAVENOUS
  Filled 2021-12-28 (×7): qty 100

## 2021-12-28 MED ORDER — PANTOPRAZOLE 2 MG/ML SUSPENSION: 40.0000 mg | Freq: Every day | ORAL | Status: DC

## 2021-12-28 MED ORDER — SODIUM CHLORIDE 0.9 % IV SOLN: INTRAVENOUS | Status: AC

## 2021-12-28 MED ORDER — SODIUM BICARBONATE 8.4 % IV SOLN
50.0000 meq | Freq: Once | INTRAVENOUS | Status: DC
Start: 2021-12-28 — End: 2021-12-28

## 2021-12-28 MED ORDER — SODIUM BICARBONATE 8.4 % IV SOLN
INTRAVENOUS | Status: AC
  Filled 2021-12-28: qty 100

## 2021-12-28 MED ORDER — NOREPINEPHRINE 16 MG/250ML-% IV SOLN
2.0000 ug/min | INTRAVENOUS | Status: DC
  Administered 2021-12-28: 0 ug/min via INTRAVENOUS
  Administered 2021-12-29: 15 ug/min via INTRAVENOUS
  Administered 2021-12-31: 5 ug/min via INTRAVENOUS
  Administered 2022-01-03: 4 ug/min via INTRAVENOUS
  Filled 2021-12-28 (×5): qty 250

## 2021-12-28 MED ORDER — FENTANYL CITRATE PF 50 MCG/ML IJ SOSY
50.0000 ug | PREFILLED_SYRINGE | INTRAMUSCULAR | Status: AC | PRN
  Administered 2021-12-28 (×3): 50 ug via INTRAVENOUS
  Filled 2021-12-28 (×2): qty 1

## 2021-12-28 MED ORDER — AMIODARONE HCL 200 MG PO TABS
100.0000 mg | ORAL_TABLET | Freq: Two times a day (BID) | ORAL | Status: DC
  Administered 2021-12-28 (×2): 100 mg
  Filled 2021-12-28 (×2): qty 1

## 2021-12-28 MED ORDER — ROCURONIUM BROMIDE 50 MG/5ML IV SOLN
100.0000 mg | Freq: Once | INTRAVENOUS | Status: AC
  Administered 2021-12-28: 100 mg via INTRAVENOUS
  Filled 2021-12-28: qty 10

## 2021-12-28 MED ORDER — FUROSEMIDE 10 MG/ML IJ SOLN
80.0000 mg | Freq: Two times a day (BID) | INTRAMUSCULAR | Status: DC
  Administered 2021-12-28: 80 mg via INTRAVENOUS
  Filled 2021-12-28: qty 8

## 2021-12-28 MED ORDER — SODIUM BICARBONATE 8.4 % IV SOLN
INTRAVENOUS | Status: AC
  Filled 2021-12-28: qty 150

## 2021-12-28 NOTE — Progress Notes (Signed)
FMTS Brief Progress Note Late Entry   S: Team was paged regarding increased tachypnea overnight. The RN had concerns that maybe this was an adverse reaction to the Phenergan. Dr. Velna Ochs and I went up to evaluate the patient. He was tachypnic with RR in the 30s-40s. Intermittently this would drop lower into the 20s. He stated that he felt thirsty and weak. He felt dehydrated and that he could not catch his breath fully. He denied any chest pain. He states he has felt similarly when his heart has gone out of rhythm but he doesn't hear "the clicking" and doesn't feel like that is the cause.    O: BP (!) 117/99 (BP Location: Right Arm)   Pulse (!) 108   Temp 98.6 F (37 C) (Oral)   Resp 20   Ht 5\' 10"  (1.778 m)   Wt 93.4 kg   SpO2 94%   BMI 29.54 kg/m   Gen: Uncomfortable appearing, diaphoretic, tachypnic, laying down in bed  Resp: CTAB without evidence of crackles/wheeze/rales Card: RRR, mildly tachycardic low 100s  Ext: No edema   A/P: Acute Tachypnea His presentation was concerning for cardiac etiology. Considered pulmonary edema given low EF but he appears volume down clinically and lung exam clear in all fields. He has significant cardiac history and there is concern for possible valvular cause or MI (has previous hx). He felt warm and he was hemodynamically stable otherwise, did not feel like he was going into cardiogenic shock but he does have hx of this. STAT EKG, Troponin, CXR ordered. He was given a one time dose of IV benadryl due to concern for possible adverse reaction to compazine though did not appear allergic and no signs of anaphylaxis. CXR with stable cardiomegaly, no signs of pulmonary edema. EKG showed new ST depression in the lateral leads. We discussed with on call cardiology fellow Dr. , who agreed with STAT troponin, recommended that Echo be changed to STAT. He was also concerned for possible valvular pathology. Also consider PE. Did not recommend empirically  starting heparin gtt until more data is collected. He was going to discuss with day team for further evaluation today.   Update: After discussion with attending we have also added on lactic acid. Order for transfer to progressive placed.   Update: There was some delay in lab draws as well as patient was a difficult stick. Appears they were able to collect labs on third attempt. Echo is currently in process, cardiology note is pending.   Signed out to day team. Upper level Dr. Lendell Caprice currently evaluating patient as well.    Larita Fife, DO 12/28/2021, 8:23 AM PGY-3, [facility] Night Resident  Please page [phone] with questions.

## 2021-12-28 NOTE — Assessment & Plan Note (Deleted)
Elevated to 110. Likely due to acute GI symptoms.  -CMP tomorrow  -Consider hepatitis panel and/or imaging if persistently elevated

## 2021-12-28 NOTE — Progress Notes (Signed)
[facility] received page from [facility] unit secretary to provide support to pt.'s mother.  When [facility] arrived, pt. lying in bed intubated and sedated; mother Talbert Forest was present at bedside.  Talbert Forest shared that pt. 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  Pt. has a young son whom Talbert Forest and her husband have been helping to take care of as pt. has been in poor health.  Talbert Forest requested prayer for strength and healing.  Pt.'s father arrived shortly afterwards.  No further needs expressed at this time but chaplains remain available as needed.  Elpidio Anis, Chaplain Pager: [phone]

## 2021-12-28 NOTE — Consult Note (Addendum)
Cardiology Consultation:   Patient ID: Donald [name] MRN: [id number]; DOB: 01-06-1986  Admit date: 12/27/2021 Date of Consult: 12/28/2021  PCP:  No primary care provider on file.   [facility] Providers Cardiologist:  Dina Rich, MD Advanced Heart Failure: Dr. Gala Romney   Patient Profile:   Donald [name] is a 36 y.o. 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 Manson Passey.  History of Present Illness:   Donald [name] was last examined by Dr. Gala Romney in 09/2021 and reported overall feeling well at that time and denied any recent respiratory issues. His weight was stable at 200 lbs and he was continued on Amiodarone 100mg  daily, Coumadin, Lasix 40mg  daily, Spironolactone 25mg  daily, Farxiga 10mg  daily, Digoxin 0.0625mg  daily and Losartan 25mg  daily. Was recommended to plan for a repeat echo at his next visit and if EF had not improved, would arrange for CPX testing and referral for ICD.   He presented to ED on 12/27/2021 for evaluation of worsening nausea, vomiting and diarrhea for the past 2 days. Was febrile to 101.3 in the ED.  Initial labs showed WBC 6.2, Hgb 10.6, platelets 369, Na+ 127, K+ 4.4 and creatinine 1.40 (baseline 0.9 - 1.0). Albumin 3.0. AST 110 with ALT at 41. INR 1.4 (has been subtherapeutic in 11/2021 and at 1.4 on 12/23/2021). Lactic Acid 2.3. BNP  > 4500. Initial Hs Troponin 257 with repeat of 210. EKG shows sinus tachycardia, HR 101 with LVH and prolonged QTc of 529 ms and similar when manually calculated. CXR showed cardiomegaly without CHF. CT Abdomen showed hepatomegaly but no acute intra-abdominal pathology.   He was admitted for sepsis in the setting of likely gastroenteritis. Was started on Vancomycin and Cefepime until cultures resulted. He received a 1L fluid bolus in the ED and was started on IV fluids at 100 mL/hr on admission. His Lasix, Spironolactone, Farxiga, Digoxin and Losartan were held on admission given concerns for dehydration and his AKI. Was started on Lovenox bridging given his subtherapeutic INR.   This morning, he developed worsening tachypnea and flushing. Denied any specific chest pain but reported palpitations. Repeat labs were ordered but are currently pending. CXR showed no acute findings. Repeat EKG shows sinus tachycardia, HR 102 with TWI along the lateral leads. A repeat STAT echo was recommended by the overnight Cardiology fellow given concerns for valve thrombosis and is pending as well.     Past Medical History:  Diagnosis Date   Anemia    Autoimmune disorder (HCC)    pyoderma gangrenosum   CHF (congestive heart failure) (HCC)    Chronic systolic heart failure (HCC)    a. EF 35-40% by echo in 07/2018 b. EF at 45% by repeat echo in 04/2020   DVT (deep venous thrombosis) (HCC)    h/o   Dysrhythmia    Mitral regurgitation    a. s/p MV repair with resection of ruptured anterior papillary muscle and reconstruction of  papillary chord and placement of annuloplasty ring in 2019. b. 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HCC)    arthritis   Tricuspid regurgitation     Past Surgical History:  Procedure Laterality Date   CARDIAC CATHETERIZATION     HERNIA REPAIR Right 2018   MITRAL  VALVE REPAIR  06/07/2018   [facility] - Dr Meda Klinefelter   MITRAL VALVE REPLACEMENT N/A 06/23/2021   MULTIPLE EXTRACTIONS WITH ALVEOLOPLASTY N/A 11/08/2020   Procedure: EXTRACTION OF TEETH NUMBER ONE, FIFTHTEEN, SIXTEEN, SEVENTEEN, EIGHTTEEN AND THRTY WITH ALVEOLOPLASTY OF LOWER LEFT QUADRANT.;  Surgeon: Sharman Cheek, DMD;  Location: [facility] OR;  Service: Dentistry;  Laterality: N/A;   RIGHT/LEFT HEART CATH AND CORONARY ANGIOGRAPHY N/A 09/11/2020   Procedure: RIGHT/LEFT HEART CATH AND CORONARY ANGIOGRAPHY;  Surgeon: Dolores Patty, MD;  Location: [facility] INVASIVE CV LAB;  Service: Cardiovascular;  Laterality: N/A;   TEE WITHOUT CARDIOVERSION N/A 05/23/2020   Procedure: TRANSESOPHAGEAL ECHOCARDIOGRAM (TEE) WITH PROPOFOL;  Surgeon: [name], MD;  Location: [facility] ENDO SUITE;  Service: Endoscopy;  Laterality: N/A;     Home Medications:  Prior to Admission medications   Medication Sig Start Date End Date Taking? Authorizing Provider  acetaminophen (TYLENOL) 325 MG tablet Take 2 tablets (650 mg total) by mouth every 4 (four) hours as needed for headache or mild pain. 06/04/21  Yes Clegg, Amy D, NP  amiodarone (PACERONE) 200 MG tablet TAKE 1 TABLET BY MOUTH TWICE DAILY FOR  4  WEEKS,  THEN  DECREASE  TO  1  TABLET  DAILY 12/05/21  Yes Branch, Dorothe Pea, MD  dapagliflozin propanediol (FARXIGA) 10 MG TABS tablet Take 1 tablet (10 mg total) by mouth daily. 10/09/21  Yes Kinley Ferrentino, Bevelyn Buckles, MD  furosemide (LASIX) 40 MG tablet Take 1 tablet (40 mg total) by mouth daily. 11/18/21  Yes Keandre Linden, Bevelyn Buckles, MD  losartan (COZAAR) 25 MG tablet Take 1 tablet (25 mg total) by mouth daily. 10/11/21  Yes Markos Theil, Bevelyn Buckles, MD  pantoprazole (PROTONIX) 40 MG tablet Take 1 tablet by mouth once daily 11/13/21  Yes Laurey Morale, MD  potassium chloride SA (KLOR-CON M) 20 MEQ tablet Take 2 tablets (40 mEq total) by mouth daily. 10/11/21  Yes Woodfin Kiss, Bevelyn Buckles, MD  spironolactone (ALDACTONE) 25 MG  tablet Take 1 tablet (25 mg total) by mouth daily. Patient taking differently: Take 12.5 mg by mouth daily. 11/13/21  Yes Gertude Benito, Bevelyn Buckles, MD  warfarin (COUMADIN) 2 MG tablet Take 1 - 2 tablets daily or as directed by coumadin clinic Patient taking differently: Take 4 mg by mouth every evening. As directed by coumadin clinic 10/08/21  Yes Barrett, Joline Salt, PA-C  doxycycline (VIBRAMYCIN) 100 MG capsule Take 1 capsule (100 mg total) by mouth 2 (two) times daily. Patient not taking: Reported on 12/27/2021 11/03/21   Gwyneth Sprout, MD  oxyCODONE-acetaminophen (PERCOCET/ROXICET) 5-325 MG tablet Take 1 tablet by mouth every 6 (six) hours as needed for severe pain. Patient not taking: Reported on 12/27/2021 11/03/21   Gwyneth Sprout, MD  predniSONE (DELTASONE) 20 MG tablet Take 2 tablets (40 mg total) by mouth daily. Take 2 tablets (40 mg) daily for the next 5 days, then take 1 tablet (20 mg) daily for 5 days, then take 0.5 tablet (10mg ) for 4 days Patient not taking: Reported on 12/27/2021 11/03/21  Gwyneth Sprout, MD    Inpatient Medications: Scheduled Meds:  amiodarone  100 mg Oral BID   enoxaparin (LOVENOX) injection  1 mg/kg Subcutaneous Q12H   Warfarin - Pharmacist Dosing Inpatient   Does not apply q1600   Continuous Infusions:  sodium chloride 100 mL/hr at 12/28/21 0052   ceFEPime (MAXIPIME) IV 2 g (12/28/21 0512)   promethazine (PHENERGAN) injection (IM or IVPB) Stopped (12/28/21 0113)   vancomycin 1,250 mg (12/28/21 0553)   PRN Meds: acetaminophen, loperamide, promethazine (PHENERGAN) injection (IM or IVPB)  Allergies:   No Known Allergies  Social History:   Social History   Socioeconomic History   Marital status: Divorced    Spouse name: Not on file   Number of children: Not on file   Years of education: Not on file   Highest education level: Not on file  Occupational History   Not on file  Tobacco Use   Smoking status: Former    Types: Cigarettes    Quit date:  10/14/2020    Years since quitting: 1.2   Smokeless tobacco: Never  Vaping Use   Vaping Use: Never used  Substance and Sexual Activity   Alcohol use: No   Drug use: No   Sexual activity: Yes  Other Topics Concern   Not on file  Social History Narrative   Not on file   Social Determinants of Health   Financial Resource Strain: Not on file  Food Insecurity: Not on file  Transportation Needs: Not on file  Physical Activity: Not on file  Stress: Not on file  Social Connections: Not on file  Intimate Partner Violence: Not on file    Family History:    Family History  Problem Relation Age of Onset   Multiple sclerosis Mother    Psoriasis Mother    Depression Father    Diabetes Father    Diabetes Paternal Grandmother      ROS:  Please see the history of present illness.   All other ROS reviewed and negative.     Physical Exam/Data:   Vitals:   12/28/21 0333 12/28/21 0405 12/28/21 0427 12/28/21 0507  BP: (!) 117/99     Pulse: (!) 104 (!) 108    Resp: 20 (!) 52 20   Temp: 98.9 F (37.2 C)   98.6 F (37 C)  TempSrc: Oral   Oral  SpO2: 94% 94%    Weight:  93.4 kg    Height:        Intake/Output Summary (Last 24 hours) at 12/28/2021 0755 Last data filed at 12/28/2021 0553 Gross per 24 hour  Intake 720 ml  Output --  Net 720 ml      12/28/2021    4:05 AM 12/27/2021   11:00 PM 12/27/2021    1:41 PM  Last 3 Weights  Weight (lbs) 205 lb 14.6 oz 205 lb 11 oz 200 lb  Weight (kg) 93.4 kg 93.3 kg 90.719 kg     Body mass index is 29.54 kg/m.  General: Male sitting upright in bed. Currently on NRB.  HEENT: normal Neck: JVD appears elevated to jaw line.  Vascular: No carotid bruits; Distal pulses 2+ bilaterally Cardiac:  normal S1, S2; RRR; crisp valve sounds.  Lungs:  clear to auscultation without wheezing or rales Abd: soft, nontender, no hepatomegaly  Ext: no pitting edema. Extremities are cool to touch.  Musculoskeletal:  No deformities, BUE and BLE strength  normal and equal Neuro:  CNs 2-12 intact, no focal abnormalities noted  Psych:  Normal affect   EKG:  The EKG was personally reviewed and demonstrates: Sinus tachycardia, HR 101 with LVH and prolonged QTc of 529 ms and similar when manually calculated Telemetry:  Telemetry was personally reviewed and demonstrates: NSR with intermittent sinus tachycardia, HR in 90's to low-100's.    Relevant CV Studies:  Cardiac Catheterization: 05/2021 Impressions:   1) Normal coronary angiography with left dominant system.  2) Elevated PCWP (~20) with v-wave 30-35.  3) borderline CO/CI (CI 1.9).   Recommendations:   Per surgical team.   Echocardiogram: 10/11/2021 IMPRESSIONS     1. Left ventricular ejection fraction, by estimation, is 20 to 25%. The  left ventricle has severely decreased function. The left ventricle  demonstrates global hypokinesis. The left ventricular internal cavity size  was severely dilated. There is mild  left ventricular hypertrophy. Left ventricular diastolic parameters are  indeterminate.   2. Right ventricular systolic function is severely reduced. The right  ventricular size is normal.   3. Left atrial size was mildly dilated.   4. Right atrial size was mildly dilated.   5. There is a mechanical valve present in the mitral position. Procedure  Date: 06/20/2021.   6. The aortic valve is normal in structure. Aortic valve regurgitation is  mild to moderate.   7. The inferior vena cava is normal in size with greater than 50%  respiratory variability, suggesting right atrial pressure of 3 mmHg.   Comparison(s): No significant change from prior study.   Laboratory Data:  High Sensitivity Troponin:   Recent Labs  Lab 12/27/21 1451 12/27/21 1755  TROPONINIHS 257* 210*     Chemistry Recent Labs  Lab 12/27/21 1451  NA 127*  K 4.4  CL 95*  CO2 18*  GLUCOSE 116*  BUN 20  CREATININE 1.40*  CALCIUM 8.8*  GFRNONAA >60  ANIONGAP 14    Recent Labs  Lab  12/27/21 1451  PROT 10.2*  ALBUMIN 3.0*  AST 110*  ALT 41  ALKPHOS 112  BILITOT 0.5   Lipids No results for input(s): "CHOL", "TRIG", "HDL", "LABVLDL", "LDLCALC", "CHOLHDL" in the last 168 hours.  Hematology Recent Labs  Lab 12/27/21 1451  WBC 6.2  RBC 4.05*  HGB 10.6*  HCT 32.2*  MCV 79.5*  MCH 26.2  MCHC 32.9  RDW 14.7  PLT 369   Thyroid No results for input(s): "TSH", "FREET4" in the last 168 hours.  BNP Recent Labs  Lab 12/27/21 1451  BNP >4,500.0*    DDimer No results for input(s): "DDIMER" in the last 168 hours.   Radiology/Studies:  DG CHEST PORT 1 VIEW  Result Date: 12/28/2021 CLINICAL DATA:  36 year old male with tachypnea and weakness. EXAM: PORTABLE CHEST 1 VIEW COMPARISON:  Chest radiograph 12/27/2021 and earlier. FINDINGS: Portable AP semi upright view at 0555 hours. Cardiomegaly. Cardiac valve replacement. Prior sternotomy. Stable lung volumes. Allowing for portable technique the lungs are clear. No pneumothorax or pleural effusion. Visualized tracheal air column is within normal limits. Age advanced bony changes at both shoulders. No acute osseous abnormality identified. Paucity of bowel gas. IMPRESSION: Stable cardiomegaly. No acute cardiopulmonary abnormality. Electronically Signed   By: Odessa Fleming M.D.   On: 12/28/2021 06:17   CT Abdomen Pelvis W Contrast  Result Date: 12/27/2021 CLINICAL DATA:  Diarrhea, nausea vomiting.  Abdominal pain. EXAM: CT ABDOMEN AND PELVIS WITH CONTRAST TECHNIQUE: Multidetector CT imaging of the abdomen and pelvis was performed using the standard protocol following bolus administration of intravenous contrast. RADIATION DOSE REDUCTION:  This exam was performed according to the departmental dose-optimization program which includes automated exposure control, adjustment of the mA and/or kV according to patient size and/or use of iterative reconstruction technique. CONTRAST:  73mL OMNIPAQUE IOHEXOL 300 MG/ML  SOLN COMPARISON:  Pelvic CT  dated 03/12/2021. FINDINGS: Lower chest: Bibasilar linear atelectasis/scarring. The visualized lung bases are otherwise clear. There is cardiomegaly. No intra-abdominal free air or free fluid. Hepatobiliary: The liver is enlarged measuring approximately 20 cm in midclavicular length. No biliary ductal dilatation. The gallbladder is unremarkable. Pancreas: Unremarkable. No pancreatic ductal dilatation or surrounding inflammatory changes. Spleen: Normal in size without focal abnormality. Adrenals/Urinary Tract: The adrenal glands unremarkable. Subcentimeter right renal upper pole hypodense focus is too small to characterize. There is no hydronephrosis on either side. The visualized ureters and urinary bladder appear unremarkable. Stomach/Bowel: There is no bowel obstruction or active inflammation. The appendix is normal. Vascular/Lymphatic: The abdominal aorta and IVC are unremarkable. No portal venous gas. Mildly enlarged left iliac chain/pelvic sidewall lymph node measures 14 mm in short axis. Reproductive: The prostate and seminal vesicles are grossly unremarkable. No pelvic mass. Other: Partially visualized induration of the skin and subcutaneous soft tissues adjacent to the intergluteal cleft and superficial to the coccyx. The no definite drainable fluid collection identified by CT. This can be better evaluated with ultrasound to assess for possible drainage. Musculoskeletal: Bilateral sacroiliitis. No acute osseous pathology. IMPRESSION: 1. No acute intra-abdominal or pelvic pathology. No bowel obstruction. Normal appendix. 2. Hepatomegaly. 3. Partially visualized induration of the skin and subcutaneous soft tissues adjacent to the intergluteal cleft and superficial to the coccyx. The no definite drainable fluid collection identified by CT. This can be better evaluated with ultrasound to assess for possible drainage. Electronically Signed   By: [name] M.D.   On: 12/27/2021 20:21   DG Chest 2  View  Result Date: 12/27/2021 CLINICAL DATA:  Cough and fever. EXAM: CHEST - 2 VIEW COMPARISON:  One-view chest x-ray 07/25/2021 FINDINGS: The heart is enlarged. No edema or effusion is present. No focal airspace disease is present. Mitral valve prosthesis noted. The visualized soft tissues and bony thorax are unremarkable. IMPRESSION: 1. Cardiomegaly without failure. 2. No acute cardiopulmonary disease. Electronically Signed   By: Marin Roberts M.D.   On: 12/27/2021 15:04     Assessment and Plan:   1. Acute Hypoxic Respiratory Failure in the setting of Likely Cardiogenic Shock - Was being treated with IV antibiotics and IV fluids for sepsis due to possible gastroenteritis but developed worsening respiratory distress this AM. Concern for cardiogenic shock as extremities are cold. SBP currently in the 110's.  - Initial lactic acidosis 2.3 with repeat of 1.7 but repeat pending for this AM. STAT echo performed and results pending but EF < 20% by prelim. - Critical Care consulted and planning for intubation as not a candidate for BiPAP due to N/V. Obtain Co-ox once access established. Not currently requiring inotropic support but will need with diuresis.    2. HFrEF/Biventricular Failure - He has known biventricular failure with most recent echo in 09/2021 showing an EF of 20-25% and RV function was severely reduced. Repeat echo as above.  - PTA Spironolactone, Farxiga, Digoxin and Losartan currently held in the setting of shock and his AKI.   3. Elevated Troponin Values/Abnormal EKG - Initial Hs was elevated to 257 with repeat of 210. Rechecked this AM with results pending. Repeat EKG this AM shows new TWI along the lateral leads. He had normal cors  by cath in 05/2021 and suspect his enzyme elevation is secondary to demand ischemia. He is at risk for thrombosis due to his subtherapeutic INR and repeat echo is pending.  - QTc was prolonged on admission and has improved by repeat this AM.  Continue to avoid QT prolonging medications.    4. History of MVR - He is s/p MV repair with resection of ruptured anterior papillary muscle and reconstruction of papillary chord and placement of annuloplasty ring in 2019. Underwent MVR with mechanical mitral valve in 05/2021 at [facility]. Repeat echo has been performed with results pending. Remains on Coumadin with bridging given his subtherapeutic INR.   5. Paroxysmal Atrial Fibrillation - Maintaining NSR this admission with episodes of sinus tachycardia. No definitive recurrent atrial fibrillation. Remains on Amiodarone (need to clarify dosing as previously reduced to 100mg  daily in 08/2021 but listed as 100mg  BID in more recent notes). If he continues to maintain NSR, would prefer 100mg  daily given his young age.  - On Coumadin. INR has been subtherapeutic for at least the past month, at 1.4 on admission. Being bridged with Lovenox and reviewed with Critical Care with plans to switch to Heparin.   6. AKI - Baseline creatinine 0.98 - 1.0. Elevated to 1.40 on admission. Repeat values pending.   7. Autoimmune Disorder  - He has seronegative spondylitis and pyoderma gangrenosum. Followed by Rheumatology as an outpatient.   For questions or updates, please contact [facility] Please consult [email or web address] for contact info under    Signed, 09/2021, PA-C  12/28/2021 7:55 AM  Agree with above   36 y/o male with chronic systolic HF, severe MVR s/p MV ring -> mechanical MVR, PAF. Now admitted with cardiogenic shock and volume overload.  Received IVF in ER and developed respiratory distress.   Moved to ICU and intubated. Started on Ellsworth Lennox. Lactic acid > 9.   Echo EF 15% RV severely down. MVR thick mean gradient 5-6  General:  Intubated sedated  HEENT: normal + ETT Neck: supple. JVP to ear  Carotids 2+ bilat; no bruits. No lymphadenopathy or thryomegaly appreciated. Cor: PMI nondisplaced. Regular rate & rhythm. Mechanical s1.  Lungs:  + crackles Abdomen: soft, nontender, nondistended. No hepatosplenomegaly. No bruits or masses. Good bowel sounds. Extremities: no cyanosis, clubbing, rash, edema cold Neuro: alert & orientedx3, cranial nerves grossly intact. moves all 4 extremities w/o difficulty. Affect pleasant  He has cardiogenic shock with severe biventricular dysfunction. MVR looks grossly ok but hard to see due to shadowing. Continue NE. Add milrinone. Follow CVP and co-ox. Diurese. TEE prior to extubation. Will need to start thinking about transplant candidacy.   CRITICAL CARE Performed by: 12/30/2021  Total critical care time: 45 minutes  Critical care time was exclusive of separately billable procedures and treating other patients.  Critical care was necessary to treat or prevent imminent or life-threatening deterioration.  Critical care was time spent personally by me (independent of midlevel providers or residents) on the following activities: development of treatment plan with patient and/or surrogate as well as nursing, discussions with consultants, evaluation of patient's response to treatment, examination of patient, obtaining history from patient or surrogate, ordering and performing treatments and interventions, ordering and review of laboratory studies, ordering and review of radiographic studies, pulse oximetry and re-evaluation of patient's condition.  32, MD  10:25 AM

## 2021-12-28 NOTE — CV Procedure (Signed)
    TRANSESOPHAGEAL ECHOCARDIOGRAM   NAME:  MOSI HANNOLD   MRN: [id number] DOB:  04/01/1986   ADMIT DATE: 12/27/2021  INDICATIONS:  Sepsis  PROCEDURE:   Informed consent was obtained prior to the procedure from the patient's family. The risks, benefits and alternatives for the procedure were discussed and the patient comprehended these risks.  Risks include, but are not limited to, cough, sore throat, vomiting, nausea, somnolence, esophageal and stomach trauma or perforation, bleeding, low blood pressure, aspiration, pneumonia, infection, trauma to the teeth and death.    After a procedural time-out, the patient was further sedated on the vent. .  The transesophageal probe was inserted in the esophagus and stomach without difficulty and multiple views were obtained.    COMPLICATIONS:    There were no immediate complications.  FINDINGS:  LEFT VENTRICLE: Markedly dilated. EF 10%  RIGHT VENTRICLE: Severe HK  LEFT ATRIUM: Moderately dilated  LEFT ATRIAL APPENDAGE: No thrombus.   RIGHT ATRIUM: Mildly dilated  AORTIC VALVE:  Trileaflet. Trivial AI  MITRAL VALVE:    Mechanical MVR. Both leaflets open. Moderate MS mean . + vegetation  TRICUSPID VALVE: Normal. Trivial TR  PULMONIC VALVE: Grossly normal.  INTERATRIAL SEPTUM: No PFO or ASD.  PERICARDIUM: No effusion  DESCENDING AORTA: Mild plaque   CONCLUSION:  Severe biventricular dysfunction. MVR functioning properly. + MV vegetation  Delmont Prosch,MD 10:02 PM

## 2021-12-28 NOTE — Assessment & Plan Note (Addendum)
Last QTc 504 on 7/27.  Plan for EKG today to follow-up

## 2021-12-28 NOTE — Progress Notes (Signed)
Assisted MD at bedside intubation.  ETT placed with no issues.  Good color change ETCO2, BBS equal, CXR pending.

## 2021-12-28 NOTE — Progress Notes (Signed)
Spoke with Trula Ore RN, states MD is at bedside at this time to place CVC and to disregard PICC order.

## 2021-12-28 NOTE — Significant Event (Addendum)
Rapid Response Event Note   Reason for Call :  Called by primary MD, concern for cardiogenic shock  Initial Focused Assessment:  Bedside ECHO being performed on my evaluation. Pt is initially AO. Skin is warm, dry. Extremities progressively became colder throughout my evaluation. Pt was intermittently resting and having hallucinations. He remained tachypneic, progressively more belly breathing. Lung sounds are clear.  Unable to auscultated heart sounds, as bedside ECHO was being performed.   VS: T 99.40F, BP 126/83, HR 90, RR 43, SpO2 94% on room air  Interventions:  Initially placed pt on 2LNC, as his symptoms escalated he was increased to 15L NRB. His oxygen saturation remained >93%.    Plan of Care:  Transfer to ICU  Event Summary:  MD Notified: Dr. Larita Fife Call Time: 0813 Arrival Time: 0815 End Time: 0945  Jennye Moccasin, RN

## 2021-12-28 NOTE — Progress Notes (Addendum)
MD to bedside directly after hand off.   Patient appears to be in respiratory distress, RR to 60s by 30 second count BP normotensive No vitals since 0400 Phlebotomy at bedside, third stick today unable to draw much volume. Asked them to send trops and LA if able with volume collected.    Rapid called, RN to bedside. Mainly wanted help with PICC, vitals, and support.   ECHO to bedside, believes reduced EF, likely below 20% from prelim read.   Hands and feet became cool and clammy, more concerning for cardiogenic shock. CCM called and to bedside. Will admit, bed 2H01. Plan to obtain PICC and intubated once he reaches ICU.   Cardiology called for FYI. PA to bedside and updated. She will call Dr. Milas Kocher.   Fayette Pho, MD

## 2021-12-28 NOTE — IPAL (Signed)
Interdisciplinary Goals of Care Family Meeting   Date carried out:: 12/28/2021  Location of the meeting: Unit  Member's involved: Physician, Bedside Registered Nurse, and Family Member or next of kin  Durable Power of Attorney or acting medical decision maker: Donald [name]  Discussion: We discussed goals of care for [name] .    The Clinical status was relayed to patient's mother at bedside in detail.   Updated and notified of patients medical condition.     Patient is a struggling to breathe, due to heart failure will require to be placed on mechanical vent later he has multiple organ dysfunctions including heart failure kidney failure and respiratory failure.  Explained patient mother that his condition is very critical, next 24 to 48 hours will be crucial to see how he progress  Code status: Full Code  Disposition: Continue current acute care    Family are satisfied with Plan of action and management. All questions answered   Cheri Fowler MD [facility] See Amion for pager If no response to pager, please call [phone] until 7pm After 7pm, Please call E-link [phone]

## 2021-12-28 NOTE — Assessment & Plan Note (Deleted)
Likely hypovolemic hyponatremia in the setting of GI losses. Na 127.  -gentle IVF hydration with NS -Encourage PO hydration -CMP in AM

## 2021-12-28 NOTE — Procedures (Signed)
Arterial Catheter Insertion Procedure Note  DEYLAN CANTERBURY  [id number]  1985/09/14  Date:12/28/21  Time:10:30 AM    Provider Performing: Cheri Fowler    Procedure: Insertion of Arterial Line (67014) with US guidance (10301)   Indication(s) Blood pressure monitoring and/or need for frequent ABGs  Consent Unable to obtain consent due to emergent nature of procedure.  Anesthesia None   Time Out Verified patient identification, verified procedure, site/side was marked, verified correct patient position, special equipment/implants available, medications/allergies/relevant history reviewed, required imaging and test results available.   Sterile Technique Maximal sterile technique including full sterile barrier drape, hand hygiene, sterile gown, sterile gloves, mask, hair covering, sterile ultrasound probe cover (if used).   Procedure Description Area of catheter insertion was cleaned with chlorhexidine and draped in sterile fashion. With real-time ultrasound guidance an arterial catheter was placed into the left  Axillary  artery.  Appropriate arterial tracings confirmed on monitor.     Complications/Tolerance None; patient tolerated the procedure well.   EBL Minimal   Specimen(s) None

## 2021-12-28 NOTE — Procedures (Signed)
Intraosseous Needle Insertion Procedure Note    Date:12/28/21  Time:10:30 AM   Provider Performing:Weslee Fogg   Procedure: Insertion Intraosseous (81191)  Indication(s) Medication administration  Consent Unable to obtain consent due to emergent nature of procedure.  Anesthesia Topical only with 1% lidocaine   Timeout Verified patient identification, verified procedure, site/side was marked, verified correct patient position, special equipment/implants available, medications/allergies/relevant history reviewed, required imaging and test results available.  Procedure Description Area of needle insertion was cleaned with chlorhexidine. Intraosseous needle was placed into the left tibia. Bone marrow was aspirated and site easily flushed. The needle was secured in place and dressing applied.  Complications/Tolerance None; patient tolerated the procedure well.  EBL Minimal

## 2021-12-28 NOTE — Progress Notes (Signed)
  Echocardiogram Echocardiogram Transesophageal has been performed.  [name] 12/28/2021, 7:25 PM

## 2021-12-28 NOTE — Progress Notes (Signed)
  TEE performed which showed severe biventricular dysfunction. LVEF 10%. Mechanical MV opening adequately with moderate MS. + MV vegetation.   Swan placed. RHC numbers done personally  On milrinone 0.375 and NE  RA 14 PA 50/34 (41) PCWP 19 Thermo 6.4/3.1 SVR 859  Current hemodynamics most c/w septic physiology. No need for mechanical support at this time.   Starting to make some urine. Lasix gtt turned off with swan numbers.   Continue abx and inotropes/pressors. Remains critically ill.   Check CMET and lactic acid.  Family updated personally.    Additional CCT 40 mins.   Arvilla Meres, MD  10:09 PM

## 2021-12-28 NOTE — Procedures (Signed)
Intubation Procedure Note  Donald [name]  [id number]  Sep 08, 1985  Date:12/28/21  Time:10:29 AM   Provider Performing:Teckla Christiansen    Procedure: Intubation (31500)  Indication(s) Respiratory Failure  Consent Unable to obtain consent due to emergent nature of procedure.   Anesthesia Etomidate and Rocuronium   Time Out Verified patient identification, verified procedure, site/side was marked, verified correct patient position, special equipment/implants available, medications/allergies/relevant history reviewed, required imaging and test results available.   Sterile Technique Usual hand hygeine, masks, and gloves were used   Procedure Description Patient positioned in bed supine.  Sedation given as noted above.  Patient was intubated with endotracheal tube using  MAC 4 .  View was Grade 1 full glottis .  Number of attempts was 1.  Colorimetric CO2 detector was consistent with tracheal placement.   Complications/Tolerance None; patient tolerated the procedure well. Chest X-ray is ordered to verify placement.   EBL Minimal   Specimen(s) None

## 2021-12-28 NOTE — Progress Notes (Signed)
Patient was tachypneic and was feeling hot around 4am, after he got his phenergan around 1am..MD was made aware.Dr.came and look at him.ordered for CXR,TROPONIN I ,EKG,stat.

## 2021-12-28 NOTE — Progress Notes (Signed)
  Echocardiogram 2D Echocardiogram has been performed.  [name] 12/28/2021, 8:56 AM

## 2021-12-28 NOTE — Progress Notes (Addendum)
ANTICOAGULATION CONSULT NOTE  Pharmacy Consult for Warfarin + heparin Indication:  MVR /AFib  No Known Allergies  Patient Measurements: Height: 5\' 10"  (177.8 cm) Weight: 93.4 kg (205 lb 14.6 oz) IBW/kg (Calculated) : 73 Heparin Dosing Weight: 90.7 kg  Vital Signs: Temp: 101.3 F (38.5 C) (07/15 2009) Temp Source: Core (07/15 2000) BP: 122/78 (07/15 2009) Pulse Rate: 84 (07/15 2009)  Labs: Recent Labs    12/27/21 1451 12/27/21 1755 12/28/21 0748 12/28/21 0949 12/28/21 1034 12/28/21 1552 12/28/21 1640 12/28/21 1944  HGB 10.6*  --   --  10.9*  --  13.3  --  11.6*  HCT 32.2*  --   --  32.0*  --  39.0  --  34.0*  PLT 369  --   --   --   --   --   --   --   LABPROT 16.8*  --   --   --  24.5*  --   --   --   INR 1.4*  --   --   --  2.2*  --   --   --   CREATININE 1.40*  --  2.15*  --  2.54*  --  2.66* 2.70*  TROPONINIHS 257* 210* 350*  --   --   --  978*  --      Estimated Creatinine Clearance: 44.7 mL/min (A) (by C-G formula based on SCr of 2.7 mg/dL (H)).   Medical History: Past Medical History:  Diagnosis Date   Anemia    Autoimmune disorder (HCC)    pyoderma gangrenosum   CHF (congestive heart failure) (HCC)    Chronic systolic heart failure (HCC)    a. EF 35-40% by echo in 07/2018 b. EF at 45% by repeat echo in 04/2020   DVT (deep venous thrombosis) (HCC)    h/o   Dysrhythmia    Mitral regurgitation    a. 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 b. 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HCC)    arthritis   Tricuspid regurgitation    Medications:  Medications Prior to Admission  Medication Sig Dispense Refill Last Dose   acetaminophen (TYLENOL) 325 MG tablet Take 2 tablets (650 mg total) by mouth every 4 (four) hours as needed  for headache or mild pain.   12/26/2021   amiodarone (PACERONE) 200 MG tablet TAKE 1 TABLET BY MOUTH TWICE DAILY FOR  4  WEEKS,  THEN  DECREASE  TO  1  TABLET  DAILY 56 tablet 0 12/26/2021   dapagliflozin propanediol (FARXIGA) 10 MG TABS tablet Take 1 tablet (10 mg total) by mouth daily. 30 tablet 3 12/26/2021   furosemide (LASIX) 40 MG tablet Take 1 tablet (40 mg total) by mouth daily. 30 tablet 1 12/26/2021   losartan (COZAAR) 25 MG tablet Take 1 tablet (25 mg total) by mouth daily. 90 tablet 3 12/26/2021   pantoprazole (PROTONIX) 40 MG tablet Take 1 tablet by mouth once daily 30 tablet 0 12/26/2021   potassium chloride SA (KLOR-CON M) 20 MEQ tablet Take 2 tablets (40 mEq total) by mouth daily. 180 tablet 3 12/26/2021   spironolactone (ALDACTONE) 25 MG tablet Take 1 tablet (25 mg total) by mouth daily. (Patient taking differently: Take 12.5 mg by mouth daily.) 90  tablet 1 12/26/2021   warfarin (COUMADIN) 2 MG tablet Take 1 - 2 tablets daily or as directed by coumadin clinic (Patient taking differently: Take 4 mg by mouth every evening. As directed by coumadin clinic) 30 tablet 4 12/26/2021 at 2100   doxycycline (VIBRAMYCIN) 100 MG capsule Take 1 capsule (100 mg total) by mouth 2 (two) times daily. (Patient not taking: Reported on 12/27/2021) 20 capsule 0 Completed Course   oxyCODONE-acetaminophen (PERCOCET/ROXICET) 5-325 MG tablet Take 1 tablet by mouth every 6 (six) hours as needed for severe pain. (Patient not taking: Reported on 12/27/2021) 10 tablet 0 Completed Course   predniSONE (DELTASONE) 20 MG tablet Take 2 tablets (40 mg total) by mouth daily. Take 2 tablets (40 mg) daily for the next 5 days, then take 1 tablet (20 mg) daily for 5 days, then take 0.5 tablet (10mg ) for 4 days (Patient not taking: Reported on 12/27/2021) 17 tablet 0 Completed Course   Scheduled:   amiodarone  100 mg Per Tube BID   Chlorhexidine Gluconate Cloth  6 each Topical Daily   mouth rinse  15 mL Mouth Rinse Q2H   pantoprazole  sodium  40 mg Per Tube Daily   polyethylene glycol  17 g Per Tube Daily   vancomycin variable dose per unstable renal function (pharmacist dosing)   Does not apply See admin instructions   Warfarin - Pharmacist Dosing Inpatient   Does not apply q1600   Infusions:   ceFEPime (MAXIPIME) IV Stopped (12/28/21 1710)   furosemide (LASIX) 200 mg in dextrose 5 % 100 mL (2 mg/mL) infusion Stopped (12/28/21 1940)   heparin 1,250 Units/hr (12/28/21 2000)   milrinone 0.375 mcg/kg/min (12/28/21 2000)   norepinephrine (LEVOPHED) Adult infusion 25 mcg/min (12/28/21 2000)   propofol (DIPRIVAN) infusion 30 mcg/kg/min (12/28/21 2000)   PRN:   Assessment: 35 yom with a history of mitral regurgitation s/p valve replacement on Coumadin, HF, AF and prior DVT, and Hx of hidradenitis and pyoderma gangrenosum . Patient presenting with weakness.  Patient taking warfarin pta. Home dose is 4mg  daily per last [facility] note. INR 1.4 on 7/10 instructed to take 6mg  that night and then continue 4mg  daily. Last taken 7/14.  INR today came back at 2.2. Heparin started this morning at 1250 units/hr - had planned for level in 6 hours but heparin stopped for 9/10 placement this evening. Discussed with MD and okay to restart heparin infusion at previous rate on 7/15@2030 . No s/sx of bleeding.   Goal of Therapy:  Heparin level: 0.3-0.7 INR 2.5-3.5 Monitor platelets by anticoagulation protocol: Yes   Plan:  Restart heparin infusion at 1250 units/hr Get heparin level in 6 hours Warfarin on hold  Monitor H/H, plt, s/s of bleeding, and HL as appropriate   , PharmD, BCCCP Clinical Pharmacist  Phone: [phone] 12/28/2021 8:34 PM  Please check AMION for all [facility] phone numbers After 10:00 PM, call Main Pharmacy [phone]

## 2021-12-28 NOTE — Procedures (Signed)
Central Venous Catheter Insertion Procedure Note  Donald [name]  [id number]  Oct 25, 1985  Date:12/28/21  Time:10:31 AM   Provider Performing:Brexlee Heberlein   Procedure: Insertion of Non-tunneled Central Venous [phone]) with US guidance (60737)   Indication(s) Medication administration  Consent Risks of the procedure as well as the alternatives and risks of each were explained to the patient and/or caregiver.  Consent for the procedure was obtained and is signed in the bedside chart  Anesthesia Topical only with 1% lidocaine   Timeout Verified patient identification, verified procedure, site/side was marked, verified correct patient position, special equipment/implants available, medications/allergies/relevant history reviewed, required imaging and test results available.  Sterile Technique Maximal sterile technique including full sterile barrier drape, hand hygiene, sterile gown, sterile gloves, mask, hair covering, sterile ultrasound probe cover (if used).  Procedure Description Area of catheter insertion was cleaned with chlorhexidine and draped in sterile fashion.  With real-time ultrasound guidance a central venous catheter was placed into the left subclavian vein. Nonpulsatile blood flow and easy flushing noted in all ports.  The catheter was sutured in place and sterile dressing applied.  Complications/Tolerance None; patient tolerated the procedure well. Chest X-ray is ordered to verify placement for internal jugular or subclavian cannulation.   Chest x-ray is not ordered for femoral cannulation.  EBL Minimal  Specimen(s) None

## 2021-12-28 NOTE — Assessment & Plan Note (Addendum)
Stable, chronic.  Hemoglobin near baseline.  Continue to monitor. - start iron supplementation (ferrous sulfate 325 mg, every other day)

## 2021-12-28 NOTE — Hospital Course (Addendum)
Donald [name] is a 36 y.o. 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 Neg CXR and UA. Vanc and Cefepime started, Bcx obtained. Subsequently developed tachypnea and new ST depressions on HD #2. Patient then had acute decompensation and was transferred briefly to the ICU for shock where he was intubated with pressors. TTE/TEE with severe biventricular failure and vegetation on mechanical MV. ID continued abx (Vanc and Cefepime) due to neg cultures (though not enough cultures initially drawn). Eventually extubated but still needed BP support given severe HF. Came off pressors and transferred to floor. Had PICC line insertion for 6 weeks abx admin. Last day antibiotics 02/06/2022.   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 Also had pain and swelling (see below). Given concern for neurologic dysfunction, CT head ordered and was negative for hemorrhage. MRI with findings of multiple small embolic strokes in subcortical right frontoparietal region as well as a small remote right cerebellar infarct. CTA head/neck negative for aneurysm. Neurology signed off given antithrombotics and statin already initiated. He remained stable neurologically over admission. Patient to follow up with neuro's stroke clinic in 4 weeks. Started on rosuvastatin 7/29   Pyomyositis of LUE Presented on day 13 of admission (7/27)  with L upper arm swollen and painful to palpation. Duplex US negative for DVT but showed undifferentiated mass. CT with findings suspicious for pyomyositis. General surgery attempted beside aspiration without success. IR consulted and performed aspiration on 7/31, able to get 100 mL of thin bloody fluid. Continued oxy IR for pain. He was also continued on broad-spectrum abx (Vanc and cefepime) per ID. Despite aspiration, his arm continued to increase in size (largest 17 cm in diameter, on day 2 after aspiration, 8/2). Given increased swelling, pain, no AROM, very little PROM, decreased sensation up to mid arm, MRI humerus was obtained and ortho surgery was consulted. Given worsening exam findings and concerning MRI there was concern for possible soft tissue malignancy. Patient was recommended for transfer to a tertiary care center for dissection of mass. Discussed with [facility] who accepted him for transfer on 8/3. He was transferred when a bed came available on 8/4.   HFrEF, EF<20%  Mitral Valve Replacement Presented with elevated BNP, though exam and CXR without overt signs of fluid overload. Home meds initially held due to AKI and dehydration. Echo showed severe HF, with EF reduced to <20%. Cardiology placed patient on lasix. Home meds for goal directed medical therapy were added back as tolerated until discharge where he was euvolemic and asymptomatic. He was also placed on aspirin 81 mg at discharge per heart failure physician given prosthetic valve. Complete list of HF medications at date of transfer (8/4) - aspirin, carvedilol, dapagliflozin (Farxiga), digoxin, sacubitril-valsartan (Entresto), spironolactone  AKI due to sepsis Initially presented with elevated Cr to 1.4, which worsened initially with deteriorating clinical state. Nephrotoxic medications were held as able, and patient given fluids and supportive care until resolution. Creatinine upon discharge was back to baseline  ~0.6-0.8.  Paroxysmal Atrial  Fibrillation Patient has known history of PAF, presented to hospital in atrial fibrillation. On admission, patient's INR was subtherapeutic and patient was bridged with heparin. Home amiodarone was held due to development of shock liver while in ICU. Patient was able to resume amiodarone once liver improved. Patient was also able to transition to warfarin with lovenox bridge by discharge with follow up scheduled for INR checks.  Shock liver While in the ICU, patient's LFT's were significantly elevated (AST max 4576, ALT max 2449) secondary to septic/cardiogenic shock and severe heart failure. Liver enzymes improved with supportive care and patient was able to resume liver-metabolized medications. On transfer AST had down-trended to 92, ALT 60.   Issues for Follow-Up LUE mass: to be seen by orthopedic oncology at [facility] Cardiology follow up, INR check (goal 2.5-3.5) ID follow up and abx x6 weeks (until 8/24) CMP for Cr, lytes, LFTs New stroke involving L arm: Patient to follow up in stroke clinic in 4 weeks  Images for DC summary: CXR 7/14 IMPRESSION: 1. Cardiomegaly without failure. 2. No acute cardiopulmonary disease.   CTAP 7/14 IMPRESSION: 1. No acute intra-abdominal or pelvic pathology. No bowel obstruction. Normal appendix. 2. Hepatomegaly. 3. Partially visualized induration of the skin and subcutaneous soft tissues adjacent to the intergluteal cleft and superficial to the coccyx. The no definite drainable fluid collection identified by CT. This can be better evaluated with ultrasound to assess for possible drainage.   CXR 7/15 IMPRESSION: Stable cardiomegaly. No acute cardiopulmonary abnormality. Remainder of studies to confirm positioning of feeding tube, CVC, endotracheal tube  Echo 7/15 IMPRESSIONS  1. Left ventricular ejection fraction, by estimation, is <20%. The left  ventricle has severely decreased function. The left ventricle  demonstrates  global hypokinesis. The left ventricular internal cavity size was severely  dilated. Left ventricular  diastolic function could not be evaluated.   2. Right ventricular systolic function is moderately reduced. The right  ventricular size is moderately enlarged. There is moderately elevated  pulmonary artery systolic pressure.   3. Right atrial size was moderately dilated.   4. Technically challenging images. One of the mitral leaflets can be seen  moving, but the other is obscured. Gradient is expected but with very low  stroke volume, this may be a falsely low gradient. The mitral valve has  been repaired/replaced. Trivial  mitral valve regurgitation. The mean mitral valve gradient is 5.5 mmHg.  There is a 31 mm [employer] mechanical valve present in the mitral position.  Procedure Date: 06/10/2021.   5. Tricuspid valve regurgitation is moderate.   6. The aortic valve is tricuspid. Aortic valve regurgitation is trivial.   7. Pulmonic valve regurgitation is moderate.   8. The inferior vena cava is dilated in size with <50% respiratory  variability, suggesting right atrial pressure of 15 mmHg.   CT Head 7/27 IMPRESSION: Stable head CT without acute intracranial findings. No CT evidence of acute stroke.  MRI Brain 7/27 IMPRESSION: 1. Patchy small volume acute ischemic nonhemorrhagic cortical infarcts involving the subcortical right frontoparietal region. These are likely embolic in nature. 2. Underlying mild chronic microvascular ischemic disease, advanced for age. Small remote right cerebellar infarct. 3. Multiple scattered chronic micro hemorrhages involving the cerebellum and both cerebral hemispheres, nonspecific, but favored to be secondary to poorly controlled hypertension.  UE DVT U/S 7/27 Summary:  Right:  No evidence of thrombosis in the subclavian.  Left:  No evidence of deep vein thrombosis in the upper extremity. No evidence of  superficial vein  thrombosis in the upper extremity.   CT Left Upper Extremity 7/28 IMPRESSION: Findings most consistent with cellulitis and pyomyositis involving the left axilla and anterior compartment of the left upper arm. Large collection is in the left axilla and extends into the coracobrachialis as described above. MRI with IV contrast is a more sensitive test for detection pyomyositis.  CTA Head and Neck 7/28 IMPRESSION: CT head: 1. Known small right frontoparietal lobe acute infarcts were better appreciated on the brain MRI of 01/09/2022. 2. Background mild chronic small vessel image changes within the cerebral white matter. 3. Redemonstrated small chronic infarcts within the right cerebellar hemisphere. 4. Small right mastoid effusion.   CTA neck: 1. The common carotid, internal carotid and vertebral arteries are patent within the neck without stenosis or significant atherosclerotic disease. 2. Partially imaged nonspecific edema/stranding within the left axilla. Correlate clinically and consider dedicated imaging.   CTA head: 1. No intracranial large vessel occlusion or proximal high-grade arterial stenosis. 2. Cystic-appearing lesions within the left temporal and preauricular soft tissues, measuring up to 1.7 cm. Direct visualization recommended.  [place] Fine Needle Aspiration 7/31 IMPRESSION: 1. Technically successful ultrasound-guided aspiration of left upper arm intramuscular collection, returning 100 mL thin bloody fluid, sent for Gram stain and culture.  MRI Left Humerus 8/2 IMPRESSION: Large heterogeneous intramuscular collection in the anterior compartment of the left upper extremity, measuring up to 7.2 x 5.7 axially and 18.0 cm in craniocaudal extent. Extensive associated soft tissue edema throughout the left upper extremity extending to the axilla and lower neck, and distally beyond the elbow into the forearm. Marked skin thickening medially. Findings are suggestive  of cellulitis and pyomyositis. Intrinsic T1 signal and blooming artifact suggests the presence of a hemorrhagic component, and this collection could also simply be a large hematoma. No evidence of osteomyelitis or acute fracture.

## 2021-12-28 NOTE — Assessment & Plan Note (Deleted)
Trop 257>210, likely in the setting of demand ischemia. No complaints of CP. EKG with some ST changes that appear to be chronic.

## 2021-12-28 NOTE — CV Procedure (Signed)
   Pulmonary Artery Catheter Insertion Procedure Note Donald [name] [id number] 01/16/1986    Procedure: Insertion of Pulmonary Artery Catheter Indications: Shock - Hemodynamic Monitoring   Procedure Details Consent: Risks of procedure as well as the alternatives and risks of each were explained to the (patient/caregiver).  Consent for procedure obtained. Time Out: Verified patient identification, verified procedure, site/side was marked, verified correct patient position, special equipment/implants available, medications/allergies/relevent history reviewed, required imaging and test results available.  Performed   The right neck was prepped and draped in the routine sterile fashion and anesthetized with 1% local lidocaine. An 8 FR venous sheath was placed in the right internal jugular vein using a modified Seldinger technique and u/s guidance. A standard Swan-Ganz catheter was used for the procedure. The distal tip of the PA cath was maneuvered into the right pulmonary artery using pressure waveform guidance and the sheath was sutured in place.     Evaluation Blood flow good Complications: No apparent complications Patient did tolerate procedure well. Chest X-ray ordered to verify placement.  CXR: Good position.    Arvilla Meres, MD  10:05 PM

## 2021-12-28 NOTE — Progress Notes (Signed)
Patient transferred to CVICU.

## 2021-12-28 NOTE — Consult Note (Signed)
NAME:  Donald [name], MRN:  [id number], DOB:  1985/07/03, LOS: 0 ADMISSION DATE:  12/27/2021, CONSULTATION DATE:  12/28/21 REFERRING MD:  Dr. 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    Vitals and O2 saturations stable on admit with labs significant for subtherapeutic INR 1.4, Na 127, sCr 1.4, bicarb 18, AST 110, Hgb 10.6, trop hs 257> 210, LA 2.3> 1.7, CXR neg, EKG ST with prolonged Qtc.  CT A/P normal.  Given concern for sepsis, cultures sent, started empirically on vanc/ cefepime/ flagyl and given 1L LR bolus.  He was admitted to [facility].  Overnight and this morning, he developed worsening SOB and tachypnea with saturations stable, BP stable, and given phenergan for vomiting.  Repeat EKG this am showed new STD in lateral leads.  Patient difficult stick, repeat labs pending.  Extremities now cold.  Stat TTE pending.  Cards/ HF consulted.  PCCM consulted for concern of worsening shock despite ok BP and ICU transfer.    Pertinent  Medical History  Biventricular HF, EF 20-25%- followed by Dr. Gala Romney Afib MR s/p MV repair w/ resection of ruptured anterior papillary muscle and reconstruction of papillary chord and placement of annuloplasty ring 2019, but do to recurrent MR, underwent MVR w/ mechanical valve at [facility] 05/2021 Anemia Pyoderma gangrenosum  Significant Hospital Events: Including procedures, antibiotic start and stop dates in addition to other pertinent events   7/14 admitted FPTS for sepsis, N/V 7/15 worsening shock> ICU> intubated, pressors, abx    7/14 vanc/ cefepime/ flagyl>  7/14 SARS/ flu> neg 7/14 Bcx2>   Interim History / Subjective:  C/o of SOB.  Denies any CP/ ABD Unable to pick up peripheral O2 stat  given worsening peripheral perfusion  Objective   Blood pressure 126/83, pulse 90, temperature 99.5 F (37.5 C), temperature source Axillary, resp. rate (!) 60, height 5\' 10"  (1.778 m), weight 93.4 kg, SpO2 100 %.        Intake/Output Summary (Last 24 hours) at 12/28/2021 0851 Last data filed at 12/28/2021 0553 Gross per 24 hour  Intake 720 ml  Output --  Net 720 ml   Filed Weights   12/27/21 1341 12/27/21 2300 12/28/21 0405  Weight: 90.7 kg 93.3 kg 93.4 kg    Examination: General:  ill appearing young adult male sitting upright in bed in distress HEENT: MM pink/moist, +JVP, pupils 3/reactive Neuro: lethargic, oriented x 3, MAE CV: rr, NSR PULM:  labored, clear lungs  GI: soft, bs+, NT/ ND Extremities: cool/dry, no LE edema    Resolved Hospital Problem list    Assessment & Plan:   Respiratory distress - doubt PE, lungs remain clear- possibly developing pulm edema vs developing aspiration pneumonitis/ pna vs compensation for worsening acidosis - lasix 40mg  x 1 - defer bipap given N/V - transfer to ICU stat and intubate on arrival given worsening exam/ resp distress - CXR/ ABG - send urine legionella  - full MV support, 4-8cc/kg IBW with goal Pplat <30 and DP<15  - VAP prevention protocol/ PPI - PAD protocol for sedation> fentanyl/ precedex. PRN bowel regimen given hx of profuse diarrhea.  No known diarrhea since admit, so likely can resume scheduled regimen soon - wean FiO2 as  able for SpO2 >92%   Shock> possibly multifactorial> r/o septic vs cardiogenic - doubt PE> has not been hypoxic or tachycardic.  TTE pending - given hx/ N/V/D, poor PO intake, AKI> possibly hypovolemic.   - ?myocarditis  - HF consulting, appreciate assistance - emergent CVL/ aline - monitor hemodynamics w/ flotrac - trend lactate - pending stat TTE - trend coox and trop hs  - repeat EKG> lateral STD improved, no acute STE, SR with 1 degree AV block, Qtc 508 - heparin per pharmacy till ACS  r/o and mechanical valve - follow cultures - cont empiric vanc/ cefepime for now - assess/ trend PCT  Biventricular HF - prior TTE 09/2021 EF 20-25, severely reduced RV, mild to moderate AR - HF consulted> plans for milrinone  - pending TTE - check coox - hemodynamic support as above  Prolonged Qtc - tele monitoring - maximize K / Mag - avoid Qtc prolonging meds   MR s/p MVR mechanical valve on coumadin Subtherapeutic INR on admit - heparin per pharmacy - pending TTE - HF may want TEE prior to extubation   NAGMA AKI, baseline sCr 1 Hyponatremia  - lasix x 1 - foley  - trend renal indices/ daily wts/ strict I/Os  Elevated liver enzymes - ?developing liver congestion/ shock from RV failure?  - check INR/ daily LFTs  PAF - tele monitoring - remains in NSR - continue amio per tube   Hypoglycemia - noted on repeat CMET lab draw.  Not clinically altered or diaphoretic.  Will treat now and monitor closely from aline given poor peripheral perfusion.  Could be real from developing liver shock   N/V/D - ?gastroenteritis but denied abd pain.  CT a/p wnl.  No further reported diarrhea but did having vomiting this am.  Could be from developing hypoperfusion/ shock   Best Practice (right click and "Reselect all SmartList Selections" daily)   Diet/type: NPO DVT prophylaxis: systemic heparin GI prophylaxis: PPI Lines: Central line and Arterial Line Foley:  Yes, and it is still needed Code Status:  full code Last date of multidisciplinary goals of care discussion [pending]  Mother updated by FPTS.  Pending update in ICU.   Labs   CBC: Recent Labs  Lab 12/27/21 1451  WBC 6.2  NEUTROABS 4.2  HGB 10.6*  HCT 32.2*  MCV 79.5*  PLT 369    Basic Metabolic Panel: Recent Labs  Lab 12/27/21 1451  NA 127*  K 4.4  CL 95*  CO2 18*  GLUCOSE 116*  BUN 20  CREATININE 1.40*  CALCIUM 8.8*   GFR: Estimated Creatinine Clearance: 86.2 mL/min (A) (by C-G formula based on  SCr of 1.4 mg/dL (H)). Recent Labs  Lab 12/27/21 1451 12/27/21 1755  WBC 6.2  --   LATICACIDVEN 2.3* 1.7    Liver Function Tests: Recent Labs  Lab 12/27/21 1451  AST 110*  ALT 41  ALKPHOS 112  BILITOT 0.5  PROT 10.2*  ALBUMIN 3.0*   No results for input(s): "LIPASE", "AMYLASE" in the last 168 hours. No results for input(s): "AMMONIA" in the last 168 hours.  ABG    Component Value Date/Time   PHART 7.544 (H) 07/26/2021 2310   PCO2ART 41.7 07/26/2021 2310   PO2ART 220 (H) 07/26/2021 2310   HCO3 36.1 (H) 07/26/2021 2310   TCO2 37 (H) 07/26/2021 2310   ACIDBASEDEF 3.0 (H) 07/24/2021 1038   O2SAT 74.3 08/03/2021 0520     Coagulation Profile: Recent Labs  Lab 12/23/21 1457 12/27/21 1451  INR 1.4* 1.4*    Cardiac Enzymes: No results for input(s): "CKTOTAL", "CKMB", "CKMBINDEX", "TROPONINI" in the last 168 hours.  HbA1C: Hgb A1c MFr Bld  Date/Time Value Ref Range Status  07/24/2021 02:33 PM 6.4 (H) 4.8 - 5.6 % Final    Comment:    (NOTE) Pre diabetes:          5.7%-6.4%  Diabetes:              >6.4%  Glycemic control for   <7.0% adults with diabetes     CBG: No results for input(s): "GLUCAP" in the last 168 hours.  Review of Systems:   Limited given acute distress.  See HPI  Past Medical History:  He,  has a past medical history of Anemia, Autoimmune disorder (HCC), CHF (congestive heart failure) (HCC), Chronic systolic heart failure (HCC), DVT (deep venous thrombosis) (HCC), Dysrhythmia, Mitral regurgitation, Mitral stenosis, Myocardial infarction (HCC), Paroxysmal atrial flutter (HCC), Pyoderma gangrenosa, Seronegative spondylitis (HCC), and Tricuspid regurgitation.   Surgical History:   Past Surgical History:  Procedure Laterality Date   CARDIAC CATHETERIZATION     HERNIA REPAIR Right 2018   MITRAL VALVE REPAIR  06/07/2018   [facility] - Dr Meda Klinefelter   MITRAL VALVE REPLACEMENT N/A 06/23/2021   MULTIPLE EXTRACTIONS WITH  ALVEOLOPLASTY N/A 11/08/2020   Procedure: EXTRACTION OF TEETH NUMBER ONE, FIFTHTEEN, SIXTEEN, SEVENTEEN, EIGHTTEEN AND THRTY WITH ALVEOLOPLASTY OF LOWER LEFT QUADRANT.;  Surgeon: Sharman Cheek, DMD;  Location: [facility] OR;  Service: Dentistry;  Laterality: N/A;   RIGHT/LEFT HEART CATH AND CORONARY ANGIOGRAPHY N/A 09/11/2020   Procedure: RIGHT/LEFT HEART CATH AND CORONARY ANGIOGRAPHY;  Surgeon: Dolores Patty, MD;  Location: [facility] INVASIVE CV LAB;  Service: Cardiovascular;  Laterality: N/A;   TEE WITHOUT CARDIOVERSION N/A 05/23/2020   Procedure: TRANSESOPHAGEAL ECHOCARDIOGRAM (TEE) WITH PROPOFOL;  Surgeon: [name], MD;  Location: [facility] ENDO SUITE;  Service: Endoscopy;  Laterality: N/A;     Social History:   reports that he quit smoking about 14 months ago. His smoking use included cigarettes. He has never used smokeless tobacco. He reports that he does not drink alcohol and does not use drugs.   Family History:  His family history includes Depression in his father; Diabetes in his father and paternal grandmother; Multiple sclerosis in his mother; Psoriasis in his mother.   Allergies No Known Allergies   Home Medications  Prior to Admission medications   Medication Sig Start Date End Date Taking? Authorizing Provider  acetaminophen (TYLENOL) 325 MG tablet Take 2 tablets (650 mg total) by mouth every 4 (four) hours as needed for headache or mild pain. 06/04/21  Yes Clegg, Amy D, NP  amiodarone (PACERONE) 200 MG tablet TAKE 1 TABLET BY MOUTH TWICE DAILY FOR  4  WEEKS,  THEN  DECREASE  TO  1  TABLET  DAILY 12/05/21  Yes Branch, Dorothe Pea, MD  dapagliflozin propanediol (FARXIGA) 10 MG TABS tablet Take 1 tablet (10 mg total) by mouth daily. 10/09/21  Yes Bensimhon, Bevelyn Buckles, MD  furosemide (LASIX) 40 MG tablet Take 1 tablet (40 mg total) by mouth daily. 11/18/21  Yes Bensimhon, Bevelyn Buckles, MD  losartan (COZAAR) 25 MG tablet Take 1 tablet (25 mg total) by mouth daily. 10/11/21  Yes Bensimhon,  Bevelyn Buckles, MD  pantoprazole (PROTONIX) 40 MG tablet Take 1 tablet by mouth once daily 11/13/21  Yes Laurey Morale, MD  potassium chloride SA (KLOR-CON M) 20 MEQ tablet Take 2 tablets (40 mEq  total) by mouth daily. 10/11/21  Yes Bensimhon, Bevelyn Buckles, MD  spironolactone (ALDACTONE) 25 MG tablet Take 1 tablet (25 mg total) by mouth daily. Patient taking differently: Take 12.5 mg by mouth daily. 11/13/21  Yes Bensimhon, Bevelyn Buckles, MD  warfarin (COUMADIN) 2 MG tablet Take 1 - 2 tablets daily or as directed by coumadin clinic Patient taking differently: Take 4 mg by mouth every evening. As directed by coumadin clinic 10/08/21  Yes Barrett, Joline Salt, PA-C  doxycycline (VIBRAMYCIN) 100 MG capsule Take 1 capsule (100 mg total) by mouth 2 (two) times daily. Patient not taking: Reported on 12/27/2021 11/03/21   Gwyneth Sprout, MD  oxyCODONE-acetaminophen (PERCOCET/ROXICET) 5-325 MG tablet Take 1 tablet by mouth every 6 (six) hours as needed for severe pain. Patient not taking: Reported on 12/27/2021 11/03/21   Gwyneth Sprout, MD  predniSONE (DELTASONE) 20 MG tablet Take 2 tablets (40 mg total) by mouth daily. Take 2 tablets (40 mg) daily for the next 5 days, then take 1 tablet (20 mg) daily for 5 days, then take 0.5 tablet (10mg ) for 4 days Patient not taking: Reported on 12/27/2021 11/03/21   11/05/21, MD     Critical care time: 65 mins     Gwyneth Sprout, ACNP [facility] 12/28/2021, 8:51 AM  See Amion for pager If no response to pager, please call PCCM consult pager After 7:00 pm call Elink

## 2021-12-28 NOTE — Progress Notes (Signed)
   Patient with refractory acidosis despite 2 pressors/inotropes. Now developing MSOF.   Co-ox ~60%. PCT 17  Unclear if this is primarily septic shock or cardiogenic. (Or more likely both)  D/w Family. Chance of survival increasingly grim.   Will plan TEE to better evaluate MV for clot/endocarditis followed by Theone Murdoch placement.  D/w CCM  CCT 35 mins.   Arvilla Meres, MD  6:28 PM

## 2021-12-29 DIAGNOSIS — Z9911 Dependence on respirator [ventilator] status: Secondary | ICD-10-CM

## 2021-12-29 DIAGNOSIS — J9601 Acute respiratory failure with hypoxia: Secondary | ICD-10-CM | POA: Diagnosis not present

## 2021-12-29 DIAGNOSIS — N179 Acute kidney failure, unspecified: Secondary | ICD-10-CM | POA: Diagnosis not present

## 2021-12-29 DIAGNOSIS — R6521 Severe sepsis with septic shock: Secondary | ICD-10-CM | POA: Diagnosis not present

## 2021-12-29 DIAGNOSIS — A419 Sepsis, unspecified organism: Secondary | ICD-10-CM | POA: Diagnosis not present

## 2021-12-29 DIAGNOSIS — K529 Noninfective gastroenteritis and colitis, unspecified: Secondary | ICD-10-CM | POA: Diagnosis not present

## 2021-12-29 LAB — POCT I-STAT 7, (LYTES, BLD GAS, ICA,H+H)
Acid-Base Excess: 0 mmol/L (ref 0.0–2.0)
Acid-Base Excess: 0 mmol/L (ref 0.0–2.0)
Bicarbonate: 21.5 mmol/L (ref 20.0–28.0)
Bicarbonate: 24 mmol/L (ref 20.0–28.0)
Calcium, Ion: 1 mmol/L — ABNORMAL LOW (ref 1.15–1.40)
Calcium, Ion: 1.15 mmol/L (ref 1.15–1.40)
HCT: 33 % — ABNORMAL LOW (ref 39.0–52.0)
HCT: 34 % — ABNORMAL LOW (ref 39.0–52.0)
Hemoglobin: 11.2 g/dL — ABNORMAL LOW (ref 13.0–17.0)
Hemoglobin: 11.6 g/dL — ABNORMAL LOW (ref 13.0–17.0)
O2 Saturation: 100 %
O2 Saturation: 99 %
Patient temperature: 36.8
Patient temperature: 38.2
Potassium: 3.6 mmol/L (ref 3.5–5.1)
Potassium: 4 mmol/L (ref 3.5–5.1)
Sodium: 136 mmol/L (ref 135–145)
Sodium: 136 mmol/L (ref 135–145)
TCO2: 22 mmol/L (ref 22–32)
TCO2: 25 mmol/L (ref 22–32)
pCO2 arterial: 24.2 mmHg — ABNORMAL LOW (ref 32–48)
pCO2 arterial: 38 mmHg (ref 32–48)
pH, Arterial: 7.556 — ABNORMAL HIGH (ref 7.35–7.45)
pH, Arterial: 7.414 (ref 7.35–7.45)
pO2, Arterial: 213 mmHg — ABNORMAL HIGH (ref 83–108)
pO2, Arterial: 125 mmHg — ABNORMAL HIGH (ref 83–108)

## 2021-12-29 LAB — CBC
HCT: 31.1 % — ABNORMAL LOW (ref 39.0–52.0)
Hemoglobin: 10.6 g/dL — ABNORMAL LOW (ref 13.0–17.0)
MCH: 26 pg (ref 26.0–34.0)
MCHC: 34.1 g/dL (ref 30.0–36.0)
MCV: 76.2 fL — ABNORMAL LOW (ref 80.0–100.0)
Platelets: 185 10*3/uL (ref 150–400)
RBC: 4.08 MIL/uL — ABNORMAL LOW (ref 4.22–5.81)
RDW: 14.9 % (ref 11.5–15.5)
WBC: 12.3 10*3/uL — ABNORMAL HIGH (ref 4.0–10.5)
nRBC: 0.8 % — ABNORMAL HIGH (ref 0.0–0.2)

## 2021-12-29 LAB — COMPREHENSIVE METABOLIC PANEL
ALT: 1238 U/L — ABNORMAL HIGH (ref 0–44)
AST: 4155 U/L — ABNORMAL HIGH (ref 15–41)
Albumin: 2.3 g/dL — ABNORMAL LOW (ref 3.5–5.0)
Alkaline Phosphatase: 132 U/L — ABNORMAL HIGH (ref 38–126)
Anion gap: 19 — ABNORMAL HIGH (ref 5–15)
BUN: 40 mg/dL — ABNORMAL HIGH (ref 6–20)
CO2: 19 mmol/L — ABNORMAL LOW (ref 22–32)
Calcium: 8.1 mg/dL — ABNORMAL LOW (ref 8.9–10.3)
Chloride: 97 mmol/L — ABNORMAL LOW (ref 98–111)
Creatinine, Ser: 2.39 mg/dL — ABNORMAL HIGH (ref 0.61–1.24)
GFR, Estimated: 36 mL/min — ABNORMAL LOW (ref >60)
Glucose, Bld: 107 mg/dL — ABNORMAL HIGH (ref 70–99)
Potassium: 3.9 mmol/L (ref 3.5–5.1)
Sodium: 135 mmol/L (ref 135–145)
Total Bilirubin: 1.8 mg/dL — ABNORMAL HIGH (ref 0.3–1.2)
Total Protein: 8.2 g/dL — ABNORMAL HIGH (ref 6.5–8.1)

## 2021-12-29 LAB — PROTIME-INR
INR: 3.4 — ABNORMAL HIGH (ref 0.8–1.2)
Prothrombin Time: 33.8 seconds — ABNORMAL HIGH (ref 11.4–15.2)

## 2021-12-29 LAB — GLUCOSE, CAPILLARY
Glucose-Capillary: 108 mg/dL — ABNORMAL HIGH (ref 70–99)
Glucose-Capillary: 115 mg/dL — ABNORMAL HIGH (ref 70–99)
Glucose-Capillary: 118 mg/dL — ABNORMAL HIGH (ref 70–99)
Glucose-Capillary: 138 mg/dL — ABNORMAL HIGH (ref 70–99)

## 2021-12-29 LAB — TRIGLYCERIDES: Triglycerides: 187 mg/dL — ABNORMAL HIGH (ref <150)

## 2021-12-29 LAB — COOXEMETRY PANEL
Carboxyhemoglobin: 1.4 % (ref 0.5–1.5)
Methemoglobin: 1.7 % — ABNORMAL HIGH (ref 0.0–1.5)
O2 Saturation: 73.5 %
Total hemoglobin: 9.3 g/dL — ABNORMAL LOW (ref 12.0–16.0)

## 2021-12-29 LAB — HEPARIN LEVEL (UNFRACTIONATED)
Heparin Unfractionated: 0.15 IU/mL — ABNORMAL LOW (ref 0.30–0.70)
Heparin Unfractionated: 0.16 IU/mL — ABNORMAL LOW (ref 0.30–0.70)
Heparin Unfractionated: 0.14 IU/mL — ABNORMAL LOW (ref 0.30–0.70)
Heparin Unfractionated: 0.17 IU/mL — ABNORMAL LOW (ref 0.30–0.70)

## 2021-12-29 LAB — VANCOMYCIN, RANDOM: Vancomycin Rm: 22 ug/mL

## 2021-12-29 LAB — PROCALCITONIN: Procalcitonin: 38.27 ng/mL

## 2021-12-29 LAB — MAGNESIUM: Magnesium: 1.6 mg/dL — ABNORMAL LOW (ref 1.7–2.4)

## 2021-12-29 LAB — LACTIC ACID, PLASMA: Lactic Acid, Venous: 2.2 mmol/L (ref 0.5–1.9)

## 2021-12-29 MED ORDER — SODIUM CHLORIDE 0.9 % IV SOLN: INTRAVENOUS | Status: DC

## 2021-12-29 MED ORDER — VITAL HIGH PROTEIN PO LIQD: 1000.0000 mL | ORAL | Status: DC

## 2021-12-29 MED ORDER — SODIUM CHLORIDE 0.9% IV SOLUTION: INTRAVENOUS | Status: DC | PRN

## 2021-12-29 MED ORDER — VANCOMYCIN HCL 1250 MG/250ML IV SOLN
1250.0000 mg | INTRAVENOUS | Status: DC
  Administered 2021-12-29: 1250 mg via INTRAVENOUS
  Filled 2021-12-29 (×2): qty 250

## 2021-12-29 MED ORDER — IPRATROPIUM-ALBUTEROL 0.5-2.5 (3) MG/3ML IN SOLN: 3.0000 mL | Freq: Four times a day (QID) | RESPIRATORY_TRACT | Status: DC | PRN

## 2021-12-29 MED ORDER — VITAL HIGH PROTEIN PO LIQD
1000.0000 mL | ORAL | Status: DC
  Administered 2021-12-29 – 2021-12-30 (×3): 1000 mL

## 2021-12-29 MED ORDER — MAGNESIUM SULFATE IN D5W 1-5 GM/100ML-% IV SOLN
1.0000 g | Freq: Once | INTRAVENOUS | Status: AC
Start: 2021-12-29 — End: 2021-12-29
  Administered 2021-12-29: 1 g via INTRAVENOUS
  Filled 2021-12-29: qty 100

## 2021-12-29 MED ORDER — MAGNESIUM SULFATE 4 GM/100ML IV SOLN
4.0000 g | Freq: Once | INTRAVENOUS | Status: AC
  Administered 2021-12-29: 4 g via INTRAVENOUS
  Filled 2021-12-29: qty 100

## 2021-12-29 MED ORDER — SODIUM CHLORIDE 0.9% IV SOLUTION: INTRAVENOUS | Status: DC

## 2021-12-29 MED ORDER — POTASSIUM CHLORIDE 20 MEQ PO PACK
20.0000 meq | PACK | Freq: Once | ORAL | Status: AC
  Administered 2021-12-29: 20 meq
  Filled 2021-12-29: qty 1

## 2021-12-29 MED ORDER — ACETAMINOPHEN 160 MG/5ML PO SOLN
650.0000 mg | ORAL | Status: DC | PRN
  Administered 2021-12-29: 650 mg
  Filled 2021-12-29: qty 20.3

## 2021-12-29 MED ORDER — PROSOURCE TF PO LIQD
45.0000 mL | Freq: Two times a day (BID) | ORAL | Status: DC
  Administered 2021-12-29 (×2): 45 mL
  Filled 2021-12-29 (×3): qty 45

## 2021-12-29 NOTE — Progress Notes (Signed)
eLink Physician-Brief Progress Note Patient Name: GABE GLACE DOB: Aug 24, 1985 MRN: [id number]   Date of Service  12/29/2021  HPI/Events of Note  Lactic down to 6.5 from 9.0 with levo at 20 at this time,   do you want to repeat lactic in AM  Seen on Lasix drip  eICU Interventions  Morning lactate ordered     Intervention Category Intermediate Interventions: Other:  Darl Pikes 12/29/2021, 12:20 AM

## 2021-12-29 NOTE — Progress Notes (Signed)
eLink Physician-Brief Progress Note Patient Name: Donald [name] DOB: [birth year] MRN: [id number]   Date of Service  12/29/2021  HPI/Events of Note  Mag 1.6  with Creatinine 2.39   on vent  with c-line  eICU Interventions  Magnesium 1 gm IVPB ordered     Intervention Category Intermediate Interventions: Electrolyte abnormality - evaluation and management  Darl Pikes 12/29/2021, 5:54 AM

## 2021-12-29 NOTE — Progress Notes (Signed)
Advanced Heart Failure Rounding Note   Subjective:    Remains intubated/sedated.  On vanc/cefepime  On NE 18/milrinone 0.375  Lactic acid 9.0 -> 2.2  LFTS up  Swan numbers c/w primary septic physiology (on pressors)  TEE + vegetation on MV  Swan  RA 9 PAP 48/31 PCWP 18 Therm 7.1/3.4 Co-ox 74%   Objective:   Weight Range:  Vital Signs:   Temp:  [97.7 F (36.5 C)-101.5 F (38.6 C)] 97.7 F (36.5 C) (07/16 0700) Pulse Rate:  [67-93] 86 (07/16 0700) Resp:  [20-60] 26 (07/16 0700) BP: (117-138)/(75-84) 117/76 (07/15 2300) SpO2:  [94 %-100 %] 100 % (07/16 0700) Arterial Line BP: (92-120)/(56-87) 103/66 (07/16 0700) FiO2 (%):  [40 %-100 %] 40 % (07/16 0700) Weight:  [93.7 kg] 93.7 kg (07/16 0242) Last BM Date : 12/28/21  Weight change: Filed Weights   12/27/21 2300 12/28/21 0405 12/29/21 0242  Weight: 93.3 kg 93.4 kg 93.7 kg    Intake/Output:   Intake/Output Summary (Last 24 hours) at 12/29/2021 0808 Last data filed at 12/29/2021 0700 Gross per 24 hour  Intake 1407.54 ml  Output 3710 ml  Net -2302.46 ml     Physical Exam: General:  Intubated/sedated HEENT: normal + ETT Neck: supple. RIJ swan   Cor: PMI nondisplaced. Regular rate & rhythm. Mechanical s1 Lungs: clear Abdomen: soft, nontender, nondistended. No hepatosplenomegaly. No bruits or masses. Good bowel sounds. Extremities: no cyanosis, clubbing, rash, edema Neuro: intubated/sedated  Telemetry: Sinus 80-90 Personally reviewed   Labs: Basic Metabolic Panel: Recent Labs  Lab 12/28/21 0748 12/28/21 0949 12/28/21 1034 12/28/21 1552 12/28/21 1640 12/28/21 1944 12/28/21 2040 12/29/21 0323 12/29/21 0403  NA 131*   < > 131*   < > 131* 134* 131* 135 136  K 5.7*   < > 5.6*   < > 5.8* 4.8 4.5 3.9 3.6  CL 97*  --  93*  --  98 100 94* 97*  --   CO2 <7*  --  9*  --  13*  --  16* 19*  --   GLUCOSE <20*  --  159*  --  74 76 84 107*  --   BUN 26*  --  28*  --  33* 33* 37* 40*  --    CREATININE 2.15*  --  2.54*  --  2.66* 2.70* 2.59* 2.39*  --   CALCIUM 8.8*  --  8.4*  --  8.2*  --  8.0* 8.1*  --   MG  --   --  2.4  --   --   --   --  1.6*  --    < > = values in this interval not displayed.    Liver Function Tests: Recent Labs  Lab 12/27/21 1451 12/28/21 0748 12/28/21 2040 12/29/21 0323  AST 110* 258* 2,267* 4,155*  ALT 41 73* 623* 1,238*  ALKPHOS 112 125 127* 132*  BILITOT 0.5 1.2 1.8* 1.8*  PROT 10.2* 9.7* 8.2* 8.2*  ALBUMIN 3.0* 2.8* 2.5* 2.3*   No results for input(s): "LIPASE", "AMYLASE" in the last 168 hours. No results for input(s): "AMMONIA" in the last 168 hours.  CBC: Recent Labs  Lab 12/27/21 1451 12/28/21 0949 12/28/21 1552 12/28/21 1944 12/29/21 0323 12/29/21 0403  WBC 6.2  --   --   --  12.3*  --   NEUTROABS 4.2  --   --   --   --   --   HGB 10.6* 10.9* 13.3 11.6* 10.6* 11.2*  HCT 32.2* 32.0* 39.0 34.0* 31.1* 33.0*  MCV 79.5*  --   --   --  76.2*  --   PLT 369  --   --   --  185  --     Cardiac Enzymes: No results for input(s): "CKTOTAL", "CKMB", "CKMBINDEX", "TROPONINI" in the last 168 hours.  BNP: BNP (last 3 results) Recent Labs    07/24/21 1433 10/11/21 1230 12/27/21 1451  BNP 3,184.1* 400.8* >4,500.0*    ProBNP (last 3 results) No results for input(s): "PROBNP" in the last 8760 hours.    Other results:  Imaging: DG CHEST PORT 1 VIEW  Result Date: 12/28/2021 CLINICAL DATA:  Respiratory failure. EXAM: PORTABLE CHEST 1 VIEW COMPARISON:  Earlier radiograph dated 12/28/2021. FINDINGS: Interval placement of a right IJ Swan-Ganz catheter with tip in the right pulmonary artery. Additional support apparatus in similar position. Cardiomegaly with mild central vascular congestion. No focal consolidation, pleural effusion, pneumothorax. Median sternotomy wires and postsurgical changes of cardiac valve. No acute osseous pathology. IMPRESSION: Interval placement of a right IJ Swan-Ganz catheter. No pneumothorax. Electronically  Signed   By: Anner Crete M.D.   On: 12/28/2021 20:58   ECHOCARDIOGRAM COMPLETE  Result Date: 12/28/2021    ECHOCARDIOGRAM REPORT   Patient Name:   Donald [name] Date of Exam: 12/28/2021 Medical Rec #:  [id number]           Height:       70.0 in Accession #:    [id number]          Weight:       205.9 lb Date of Birth:  05/24/1986          BSA:          2.113 m Patient Age:    36 years            BP:           126/83 mmHg Patient Gender: M                   HR:           90 bpm. Exam Location:  Inpatient Procedure: 2D Echo STAT ECHO Indications:    mitral valve disorder. heart failure.  History:        Patient has prior history of Echocardiogram examinations, most                 recent 10/11/2021. Signs/Symptoms:elevated troponin and Shortness                 of Breath.                  Mitral Valve: 31 mm St. Jude mechanical valve valve is present                 in the mitral position. Procedure Date: 06/10/2021.  Sonographer:    Johny Chess RDCS Referring Phys: [id number] [name] IMPRESSIONS  1. Left ventricular ejection fraction, by estimation, is <20%. The left ventricle has severely decreased function. The left ventricle demonstrates global hypokinesis. The left ventricular internal cavity size was severely dilated. Left ventricular diastolic function could not be evaluated.  2. Right ventricular systolic function is moderately reduced. The right ventricular size is moderately enlarged. There is moderately elevated pulmonary artery systolic pressure.  3. Right atrial size was moderately dilated.  4. Technically challenging images. One of the mitral leaflets can be seen moving, but the other is obscured. Gradient is  expected but with very low stroke volume, this may be a falsely low gradient. The mitral valve has been repaired/replaced. Trivial mitral valve regurgitation. The mean mitral valve gradient is 5.5 mmHg. There is a 31 mm St. Jude mechanical valve present in the mitral position.  Procedure Date: 06/10/2021.  5. Tricuspid valve regurgitation is moderate.  6. The aortic valve is tricuspid. Aortic valve regurgitation is trivial.  7. Pulmonic valve regurgitation is moderate.  8. The inferior vena cava is dilated in size with <50% respiratory variability, suggesting right atrial pressure of 15 mmHg. Conclusion(s)/Recommendation(s): Biventricular failure. 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 Can see at least one leaflet moving but not see the other. Gradient low but this may be due to poor CO. Recommend TEE, fluoro, and/or CT for further evaluation of the mitral valve. FINDINGS  Left Ventricle: Left ventricular ejection fraction, by estimation, is <20%. The left ventricle has severely decreased function. The left ventricle demonstrates global hypokinesis. The left ventricular internal cavity size was severely dilated. There is no left ventricular hypertrophy. Left ventricular diastolic function could not be evaluated. Right Ventricle: The right ventricular size is moderately enlarged. Right vetricular wall thickness was not well visualized. Right ventricular systolic function is moderately reduced. There is moderately elevated pulmonary artery systolic pressure. The tricuspid regurgitant velocity is 2.85 m/s, and with an assumed right atrial pressure of 15 mmHg, the estimated right ventricular systolic pressure is [id number] mmHg. Left Atrium: Left atrial size was not well visualized. Right Atrium: Right atrial size was moderately dilated. Pericardium: There is no evidence of pericardial effusion. Mitral Valve: Technically challenging images. One of the mitral leaflets can be seen moving, but the other is obscured. Gradient is expected but with very low stroke volume, this may be a falsely low gradient. The mitral valve has been repaired/replaced.  Trivial mitral valve regurgitation. There is a 31 mm St. Jude mechanical valve present in the mitral position. Procedure Date: 06/10/2021. MV peak  gradient, 11.2 mmHg. The mean mitral valve gradient is 5.5 mmHg. Tricuspid Valve: The tricuspid valve is grossly normal. Tricuspid valve regurgitation is moderate . No evidence of tricuspid stenosis. Aortic Valve: The aortic valve is tricuspid. Aortic valve regurgitation is trivial. Pulmonic Valve: The pulmonic valve was grossly normal. Pulmonic valve regurgitation is moderate. No evidence of pulmonic stenosis. Aorta: The aortic root, ascending aorta, aortic arch and descending aorta are all structurally normal, with no evidence of dilitation or obstruction. Venous: The inferior vena cava is dilated in size with less than 50% respiratory variability, suggesting right atrial pressure of 15 mmHg. IAS/Shunts: The interatrial septum was not well visualized.  LEFT VENTRICLE PLAX 2D LVIDd:         6.00 cm LVIDs:         5.90 cm LV PW:         0.90 cm LV IVS:        1.10 cm LVOT diam:     2.60 cm LV SV:         30 LV SV Index:   14 LVOT Area:     5.31 cm  RIGHT VENTRICLE            IVC RV S prime:     8.59 cm/s  IVC diam: 2.30 cm TAPSE (M-mode): 1.3 cm LEFT ATRIUM         Index       RIGHT ATRIUM           Index LA diam:    4.00  cm 1.89 cm/m  RA Area:     22.40 cm                                 RA Volume:   72.20 ml  34.16 ml/m  AORTIC VALVE LVOT Vmax:   53.60 cm/s LVOT Vmean:  33.000 cm/s LVOT VTI:    0.057 m  AORTA Ao Asc diam: 3.40 cm MITRAL VALVE                TRICUSPID VALVE MV Area (PHT): 6.37 cm     TR Peak grad:   32.5 mmHg MV Area VTI:   1.17 cm     TR Vmax:        285.00 cm/s MV Peak grad:  11.2 mmHg MV Mean grad:  5.5 mmHg     SHUNTS MV Vmax:       1.67 m/s     Systemic VTI:  0.06 m MV Vmean:      106.6 cm/s   Systemic Diam: 2.60 cm MV Decel Time: 119 msec MV E velocity: 143.00 cm/s MV A velocity: 119.00 cm/s MV E/A ratio:  1.20 Buford Dresser MD Electronically signed by Buford Dresser MD Signature Date/Time: 12/28/2021/1:37:16 PM    Final    DG Chest Port 1 View  Result Date:  12/28/2021 CLINICAL DATA:  Respiratory failure and status post intubation, orogastric tube placement and central line placement. EXAM: PORTABLE CHEST 1 VIEW COMPARISON:  Prior study at 0600 hours FINDINGS: Stable cardiac enlargement. Interval intubation with endotracheal tube tip approximately 4 cm above the carina. Placement of left subclavian central line with catheter tip in the upper SVC. Gastric decompression tube extends below the diaphragm. There is no evidence of pulmonary edema, consolidation, pneumothorax or pleural fluid. IMPRESSION: Appropriate positioning endotracheal tube, central line and orogastric tube. No pneumothorax or other acute findings. Electronically Signed   By: Aletta Edouard M.D.   On: 12/28/2021 12:11   DG Abd Portable 1V  Result Date: 12/28/2021 CLINICAL DATA:  Enteric tube placement EXAM: PORTABLE ABDOMEN - 1 VIEW COMPARISON:  None Available. FINDINGS: Tip of enteric tube is seen in the region of body of stomach. Heart is enlarged in size. Bowel gas pattern in the upper abdomen is unremarkable. IMPRESSION: Tip of enteric tube is seen in the stomach. Electronically Signed   By: Elmer Picker M.D.   On: 12/28/2021 12:00   [place] EKG SITE RITE  Result Date: 12/28/2021 If Site Rite image not attached, placement could not be confirmed due to current cardiac rhythm.  [place] EKG SITE RITE  Result Date: 12/28/2021 If Site Rite image not attached, placement could not be confirmed due to current cardiac rhythm.  DG CHEST PORT 1 VIEW  Result Date: 12/28/2021 CLINICAL DATA:  36 year old male with tachypnea and weakness. EXAM: PORTABLE CHEST 1 VIEW COMPARISON:  Chest radiograph 12/27/2021 and earlier. FINDINGS: Portable AP semi upright view at 0555 hours. Cardiomegaly. Cardiac valve replacement. Prior sternotomy. Stable lung volumes. Allowing for portable technique the lungs are clear. No pneumothorax or pleural effusion. Visualized tracheal air column is within normal limits. Age  advanced bony changes at both shoulders. No acute osseous abnormality identified. Paucity of bowel gas. IMPRESSION: Stable cardiomegaly. No acute cardiopulmonary abnormality. Electronically Signed   By: Genevie Ann M.D.   On: 12/28/2021 06:17   CT Abdomen Pelvis W Contrast  Result Date: 12/27/2021 CLINICAL DATA:  Diarrhea, nausea vomiting.  Abdominal pain. EXAM: CT ABDOMEN AND PELVIS WITH CONTRAST TECHNIQUE: Multidetector CT imaging of the abdomen and pelvis was performed using the standard protocol following bolus administration of intravenous contrast. RADIATION DOSE REDUCTION: This exam was performed according to the departmental dose-optimization program which includes automated exposure control, adjustment of the mA and/or kV according to patient size and/or use of iterative reconstruction technique. CONTRAST:  37mL OMNIPAQUE IOHEXOL 300 MG/ML  SOLN COMPARISON:  Pelvic CT dated 03/12/2021. FINDINGS: Lower chest: Bibasilar linear atelectasis/scarring. The visualized lung bases are otherwise clear. There is cardiomegaly. No intra-abdominal free air or free fluid. Hepatobiliary: The liver is enlarged measuring approximately 20 cm in midclavicular length. No biliary ductal dilatation. The gallbladder is unremarkable. Pancreas: Unremarkable. No pancreatic ductal dilatation or surrounding inflammatory changes. Spleen: Normal in size without focal abnormality. Adrenals/Urinary Tract: The adrenal glands unremarkable. Subcentimeter right renal upper pole hypodense focus is too small to characterize. There is no hydronephrosis on either side. The visualized ureters and urinary bladder appear unremarkable. Stomach/Bowel: There is no bowel obstruction or active inflammation. The appendix is normal. Vascular/Lymphatic: The abdominal aorta and IVC are unremarkable. No portal venous gas. Mildly enlarged left iliac chain/pelvic sidewall lymph node measures 14 mm in short axis. Reproductive: The prostate and seminal vesicles  are grossly unremarkable. No pelvic mass. Other: Partially visualized induration of the skin and subcutaneous soft tissues adjacent to the intergluteal cleft and superficial to the coccyx. The no definite drainable fluid collection identified by CT. This can be better evaluated with ultrasound to assess for possible drainage. Musculoskeletal: Bilateral sacroiliitis. No acute osseous pathology. IMPRESSION: 1. No acute intra-abdominal or pelvic pathology. No bowel obstruction. Normal appendix. 2. Hepatomegaly. 3. Partially visualized induration of the skin and subcutaneous soft tissues adjacent to the intergluteal cleft and superficial to the coccyx. The no definite drainable fluid collection identified by CT. This can be better evaluated with ultrasound to assess for possible drainage. Electronically Signed   By: [name] M.D.   On: 12/27/2021 20:21   DG Chest 2 View  Result Date: 12/27/2021 CLINICAL DATA:  Cough and fever. EXAM: CHEST - 2 VIEW COMPARISON:  One-view chest x-ray 07/25/2021 FINDINGS: The heart is enlarged. No edema or effusion is present. No focal airspace disease is present. Mitral valve prosthesis noted. The visualized soft tissues and bony thorax are unremarkable. IMPRESSION: 1. Cardiomegaly without failure. 2. No acute cardiopulmonary disease. Electronically Signed   By: Marin Roberts M.D.   On: 12/27/2021 15:04     Medications:     Scheduled Medications:  amiodarone  100 mg Per Tube BID   Chlorhexidine Gluconate Cloth  6 each Topical Daily   mouth rinse  15 mL Mouth Rinse Q2H   pantoprazole sodium  40 mg Per Tube Daily   polyethylene glycol  17 g Per Tube Daily   vancomycin variable dose per unstable renal function (pharmacist dosing)   Does not apply See admin instructions    Infusions:  ceFEPime (MAXIPIME) IV Stopped (12/28/21 1710)   furosemide (LASIX) 200 mg in dextrose 5 % 100 mL (2 mg/mL) infusion Stopped (12/28/21 1940)   heparin 1,400 Units/hr (12/29/21  0804)   magnesium sulfate bolus IVPB     milrinone 0.375 mcg/kg/min (12/29/21 0700)   norepinephrine (LEVOPHED) Adult infusion 18 mcg/min (12/29/21 0700)   propofol (DIPRIVAN) infusion 10 mcg/kg/min (12/29/21 0700)    PRN Medications: acetaminophen, docusate, fentaNYL (SUBLIMAZE) injection, mouth rinse   Assessment/Plan:   1. Mixed cardiogenic/septic shock - echo 12/28/21 LVEF 10%  RV sev HK - TEE 7/15 severe biventricular failure. + vegetation on mechanical MVR - Bcx pending. -> on vanc/cefepime - Co-ox 73% on NE 18 milrinone 0.375. Can drop milrinone to 0.125. Follow swan numbers   2. 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 Repeat echo as above.  - PTA Spironolactone, Farxiga, Digoxin and Losartan currently held in the setting of shock and his AKI.  - Echo this admit LVEF 10% RV severely HK.  - Long-term only option is transplant but not sure he will be candidate   3. 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  - Underwent MVR with [facility] mitral valve in 05/[facility].  - TEE 7/15 severe biventricular failure. + vegetation on mechanical MVR - Bcx pending. -> on vanc/cefepime - Consult IV in am - Continue heparin   4. Acute hypoxic respiratory failure - intubated 7/15 in setting of shock - CCM managing  5. Paroxysmal Atrial Fibrillation - Maintaining NSR this admission - off amio with liver injury - does not tolerate AF  5. AKI - Due to ATN/shock - Baseline creatinine 0.98 - 1.0.  - Peak 2.6. Improving with support now 2.3  6. Shock liver - continue supportive care  7. Autoimmune Disorder  - He has seronegative spondylitis and pyoderma gangrenosum.  Followed by Rheumatology as an outpatient.   8. Hypomag - supp  CRITICAL CARE Performed by: Glori Bickers  Total critical care time: 35 minutes  Critical care time was exclusive of separately billable procedures and treating other patients.  Critical care was necessary to treat or prevent imminent or life-threatening deterioration.  Critical care was time spent personally by me (independent of midlevel providers or residents) on the following activities: development of treatment plan with patient and/or surrogate as well as nursing, discussions with consultants, evaluation of patient's response to treatment, examination of patient, obtaining history from patient or surrogate, ordering and performing treatments and interventions, ordering and review of laboratory studies, ordering and review of radiographic studies, pulse oximetry and re-evaluation of patient's condition.   Length of Stay: 1  Glori Bickers MD 12/29/2021, 8:08 AM  Advanced Heart Failure Team Pager [phone] (M-F; 7a - 4p)  Please contact [facility] for night-coverage after hours (4p -7a ) and weekends on [email or web address]

## 2021-12-29 NOTE — Progress Notes (Signed)
Pharmacy Antibiotic Note  Raymie JAXZEN VANHORN is a 36 y.o. male for which pharmacy has been consulted for cefepime and vancomycin dosing for sepsis.  Patient with a history of mitral regurgitation s/p valve replacement on Coumadin, HF, AF and prior DVT, and Hx of hidradenitis and pyoderma gangrenosum . Patient presenting with shock.  Scr up to 2.39; cultures negative to date.  Vancomycin random level this AM 22 - drawn ~ 24 hrs after last dose.    Plan: Continue cefepime 2g q 12 hrs Vancomycin 1250 mg q 24hrs for now - will watch renal fx closely, check levels at next steady state F/u cultures, renal function and clinical course.  Height: 5\' 10"  (177.8 cm) Weight: 93.7 kg (206 lb 9.1 oz) IBW/kg (Calculated) : 73  Temp (24hrs), Avg:99.2 F (37.3 C), Min:97.5 F (36.4 C), Max:101.5 F (38.6 C)  Recent Labs  Lab 12/27/21 1451 12/27/21 1755 12/28/21 1034 12/28/21 1430 12/28/21 1632 12/28/21 1640 12/28/21 1944 12/28/21 2040 12/29/21 0323 12/29/21 0603  WBC 6.2  --   --   --   --   --   --   --  12.3*  --   CREATININE 1.40*   < > 2.54*  --   --  2.66* 2.70* 2.59* 2.39*  --   LATICACIDVEN 2.3*   < > >9.0* >9.0* >9.0*  --   --  6.5*  --  2.2*  VANCORANDOM  --   --   --   --   --   --   --   --  22  --    < > = values in this interval not displayed.     Estimated Creatinine Clearance: 50.6 mL/min (A) (by C-G formula based on SCr of 2.39 mg/dL (H)).    No Known Allergies  Antimicrobials this admission: cefepime 7/14 >>  vancomycin 7/14 >>  metronidazole 7/14 >> 7/14  Microbiology results: *FYI it looks like only 1 cx on 7/14 prior to abx - several orders "cancelled" 7/15 BCx x 2 > pending 7/14 BCx  pending 7/14 Resp Cx >   Thank you for allowing pharmacy to be a part of this patient's care.  8/14, Reece Leader, BCCP Clinical Pharmacist  12/29/2021 9:13 AM   [facility] pharmacy phone numbers are listed on [email or web address]

## 2021-12-29 NOTE — Progress Notes (Signed)
eLink Physician-Brief Progress Note Patient Name: Donald [name] DOB: July 28, 1985 MRN: [id number]   Date of Service  12/29/2021  HPI/Events of Note  ABG 7.55/24/213 On PRVC 32/580/40%/ 10 PEEP Not breathing over the set rate  eICU Interventions  Decrease rate to 20 and PEEP to 8 May go down on TV if still remains alkalotic     Intervention Category Intermediate Interventions: Other:  Darl Pikes 12/29/2021, 4:21 AM

## 2021-12-29 NOTE — Progress Notes (Signed)
Et tube holder changed at this time with no complications.  

## 2021-12-29 NOTE — Progress Notes (Signed)
ANTICOAGULATION CONSULT NOTE  Pharmacy Consult for heparin (coumadin on hold) Indication:  MVR /AFib  No Known Allergies  Patient Measurements: Height: 5\' 10"  (177.8 cm) Weight: 93.7 kg (206 lb 9.1 oz) IBW/kg (Calculated) : 73 Heparin Dosing Weight: 90.7 kg  Vital Signs: Temp: 101.5 F (38.6 C) (07/16 2300) BP: 121/82 (07/16 2013) Pulse Rate: 98 (07/16 2300)  Labs: Recent Labs    12/27/21 1451 12/27/21 1755 12/28/21 0748 12/28/21 0949 12/28/21 1034 12/28/21 1552 12/28/21 1640 12/28/21 1944 12/28/21 1944 12/28/21 2040 12/29/21 0323 12/29/21 0403 12/29/21 0958 12/29/21 1355 12/29/21 2042 12/29/21 2219  HGB 10.6*  --   --    < >  --    < >  --  11.6*  --   --  10.6* 11.2*  --   --  11.6*  --   HCT 32.2*  --   --    < >  --    < >  --  34.0*  --   --  31.1* 33.0*  --   --  34.0*  --   PLT 369  --   --   --   --   --   --   --   --   --  185  --   --   --   --   --   LABPROT 16.8*  --   --   --  24.5*  --   --   --   --   --  33.8*  --   --   --   --   --   INR 1.4*  --   --   --  2.2*  --   --   --   --   --  3.4*  --   --   --   --   --   HEPARINUNFRC  --   --   --   --   --   --   --   --    < >  --  0.15*  --  0.16* 0.14*  --  0.17*  CREATININE 1.40*  --  2.15*  --  2.54*  --  2.66* 2.70*  --  2.59* 2.39*  --   --   --   --   --   TROPONINIHS 257* 210* 350*  --   --   --  978*  --   --   --   --   --   --   --   --   --    < > = values in this interval not displayed.     Estimated Creatinine Clearance: 50.6 mL/min (A) (by C-G formula based on SCr of 2.39 mg/dL (H)).   Medical History: Past Medical History:  Diagnosis Date   Anemia    Autoimmune disorder (HCC)    pyoderma gangrenosum   CHF (congestive heart failure) (HCC)    Chronic systolic heart failure (HCC)    a. EF 35-40% by echo in 07/2018 b. EF at 45% by repeat echo in 04/2020   DVT (deep venous thrombosis) (HCC)    h/o   Dysrhythmia    Mitral regurgitation    a. s/p MV repair with resection of  ruptured anterior papillary muscle and reconstruction of papillary chord and placement of annuloplasty ring in 2019. b. 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   Mitral stenosis    Myocardial infarction (HCC)    Paroxysmal atrial flutter (HCC)    Pyoderma  gangrenosa    Seronegative spondylitis (HCC)    arthritis   Tricuspid regurgitation    Medications:   Scheduled:   Infusions:   sodium chloride 10 mL/hr at 12/29/21 2300   sodium chloride 10 mL/hr at 12/29/21 2300   sodium chloride 10 mL/hr at 12/29/21 2041   ceFEPime (MAXIPIME) IV Stopped (12/29/21 2239)   heparin 1,550 Units/hr (12/29/21 2300)   milrinone 0.125 mcg/kg/min (12/29/21 2300)   norepinephrine (LEVOPHED) Adult infusion 15 mcg/min (12/29/21 2300)   propofol (DIPRIVAN) infusion 10 mcg/kg/min (12/29/21 1402)   vancomycin Stopped (12/29/21 1651)   PRN:   Assessment: 35 yom with a history of mitral regurgitation s/p valve replacement on Coumadin, HF, AF and prior DVT, and Hx of hidradenitis and pyoderma gangrenosum . Patient presenting with weakness.  Patient taking warfarin pta. Home dose is 4mg  daily per last [facility] note. INR 1.4 on 7/10 instructed to take 6mg  that night and then continue 4mg  daily. Last taken 7/14.  Heparin level low this PM at 0.14 despite rate increase this AM; INR elevated at 3.4, but likely reflective of hepatic dysfunction, not true anticoagulation.  No overt bleeding or complications noted.  7/16 PM update:  Heparin level low   Goal of Therapy:  Heparin level: 0.3-0.7 units/mL Monitor platelets by anticoagulation protocol: Yes   Plan:  Inc heparin to 1750 units/hr 6-8 hour heparin level Warfarin on hold  Daily heparin level, CBC and INR.  , PharmD, BCPS Clinical Pharmacist Phone: [phone]

## 2021-12-29 NOTE — Progress Notes (Signed)
ANTICOAGULATION CONSULT NOTE  Pharmacy Consult for heparin (coumadin on hold) Indication:  MVR /AFib  No Known Allergies  Patient Measurements: Height: 5\' 10"  (177.8 cm) Weight: 93.7 kg (206 lb 9.1 oz) IBW/kg (Calculated) : 73 Heparin Dosing Weight: 90.7 kg  Vital Signs: Temp: 97.7 F (36.5 C) (07/16 0800) Temp Source: Core (07/16 0400) BP: 117/76 (07/15 2300) Pulse Rate: 87 (07/16 0800)  Labs: Recent Labs    12/27/21 1451 12/27/21 1755 12/28/21 0748 12/28/21 0949 12/28/21 1034 12/28/21 1552 12/28/21 1640 12/28/21 1944 12/28/21 2040 12/29/21 0323 12/29/21 0403  HGB 10.6*  --   --    < >  --    < >  --  11.6*  --  10.6* 11.2*  HCT 32.2*  --   --    < >  --    < >  --  34.0*  --  31.1* 33.0*  PLT 369  --   --   --   --   --   --   --   --  185  --   LABPROT 16.8*  --   --   --  24.5*  --   --   --   --  33.8*  --   INR 1.4*  --   --   --  2.2*  --   --   --   --  3.4*  --   HEPARINUNFRC  --   --   --   --   --   --   --   --   --  0.15*  --   CREATININE 1.40*  --  2.15*  --  2.54*  --  2.66* 2.70* 2.59* 2.39*  --   TROPONINIHS 257* 210* 350*  --   --   --  978*  --   --   --   --    < > = values in this interval not displayed.     Estimated Creatinine Clearance: 50.6 mL/min (A) (by C-G formula based on SCr of 2.39 mg/dL (H)).   Medical History: Past Medical History:  Diagnosis Date   Anemia    Autoimmune disorder (HCC)    pyoderma gangrenosum   CHF (congestive heart failure) (HCC)    Chronic systolic heart failure (HCC)    a. EF 35-40% by echo in 07/2018 b. EF at 45% by repeat echo in 04/2020   DVT (deep venous thrombosis) (HCC)    h/o   Dysrhythmia    Mitral regurgitation    a. 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 b. 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   Mitral stenosis    Myocardial infarction (HCC)    Paroxysmal atrial flutter (HCC)    Pyoderma  gangrenosa    Seronegative spondylitis (HCC)    arthritis   Tricuspid regurgitation    Medications:   Scheduled:   Infusions:   ceFEPime (MAXIPIME) IV Stopped (12/28/21 1710)   furosemide (LASIX) 200 mg in dextrose 5 % 100 mL (2 mg/mL) infusion Stopped (12/28/21 1940)   heparin 1,400 Units/hr (12/29/21 0804)   magnesium sulfate bolus IVPB     milrinone 0.375 mcg/kg/min (12/29/21 0700)   norepinephrine (LEVOPHED) Adult infusion 18 mcg/min (12/29/21 0700)   propofol (DIPRIVAN) infusion 10 mcg/kg/min (12/29/21 0700)   PRN:   Assessment: 35 yom with a history of mitral regurgitation s/p valve replacement on Coumadin, HF, AF and prior DVT,  and Hx of hidradenitis and pyoderma gangrenosum . Patient presenting with weakness.  Patient taking warfarin pta. Home dose is 4mg  daily per last [facility] note. INR 1.4 on 7/10 instructed to take 6mg  that night and then continue 4mg  daily. Last taken 7/14.  Heparin level low this AM at 0.15; INR elevated at 3.4, but likely reflective of hepatic dysfunction, not true anticoagulation.  No overt bleeding or complications noted.  Goal of Therapy:  Heparin level: 0.3-0.7 INR 2.5-3.5 Monitor platelets by anticoagulation protocol: Yes   Plan:  Increase heparin gtt to 1400 units/hr. Get heparin level in 6 hours Warfarin on hold  Daily heparin level, CBC and INR.  , , BCCP Clinical Pharmacist  12/29/2021 8:58 AM   [facility] pharmacy phone numbers are listed on [email or web address]

## 2021-12-29 NOTE — Consult Note (Signed)
NAME:  Donald [name], MRN:  [id number], DOB:  [birth year], LOS: 1 ADMISSION DATE:  12/27/2021, CONSULTATION DATE:  12/28/21 REFERRING MD:  Dr. 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    Vitals and O2 saturations stable on admit with labs significant for subtherapeutic INR 1.4, Na 127, sCr 1.4, bicarb 18, AST 110, Hgb 10.6, trop hs 257> 210, LA 2.3> 1.7, CXR neg, EKG ST with prolonged Qtc.  CT A/P normal.  Given concern for sepsis, cultures sent, started empirically on vanc/ cefepime/ flagyl and given 1L LR bolus.  He was admitted to [facility]PT[facility].  Overnight and this morning, he developed worsening SOB and tachypnea with saturations stable, BP stable, and given phenergan for vomiting.  Repeat EKG this am showed new STD in lateral leads.  Patient difficult stick, repeat labs pending.  Extremities now cold.  Stat TTE pending.  Cards/ HF consulted.  PCCM consulted for concern of worsening shock despite ok BP and ICU transfer.    Pertinent  Medical History  Biventricular HF, EF 20-25%- followed by Dr. Gala Romney Afib MR s/p MV repair w/ resection of ruptured anterior papillary muscle and reconstruction of papillary chord and placement of annuloplasty ring 2019, but do to recurrent MR, underwent MVR w/ mechanical valve at [facility] 05/2021 Anemia Pyoderma gangrenosum  Significant Hospital Events: Including procedures, antibiotic start and stop dates in addition to other pertinent events   7/14 admitted FPTS for sepsis, N/V 7/15 worsening shock> ICU> intubated, pressors, abx    7/14 vanc/ cefepime/ flagyl>  7/14 SARS/ flu> neg 7/14 Bcx2>   Interim History / Subjective:  Overnight no acute events.  Objective   Blood pressure 117/76,  pulse 86, temperature 97.7 F (36.5 C), resp. rate (!) 26, height 5\' 10"  (1.778 m), weight 93.7 kg, SpO2 100 %. PAP: (46-58)/(29-41) 47/31 CVP:  [13 mmHg-24 mmHg] 14 mmHg PCWP:  [19 mmHg] 19 mmHg CO:  [6 L/min-7.2 L/min] 7.2 L/min CI:  [2.9 L/min/m2-3.5 L/min/m2] 3.5 L/min/m2  Vent Mode: PRVC FiO2 (%):  [40 %-100 %] 40 % Set Rate:  [20 bmp-32 bmp] 20 bmp Vt Set:  [580 mL] 580 mL PEEP:  [8 cmH20-10 cmH20] 8 cmH20 Plateau Pressure:  [19 cmH20-26 cmH20] 21 cmH20   Intake/Output Summary (Last 24 hours) at 12/29/2021 0810 Last data filed at 12/29/2021 0700 Gross per 24 hour  Intake 1407.54 ml  Output 3710 ml  Net -2302.46 ml    Filed Weights   12/27/21 2300 12/28/21 0405 12/29/21 0242  Weight: 93.3 kg 93.4 kg 93.7 kg    Examination: General:  critically ill appearing man lying in bed in NAD HEENT:  [place]/AT, eyes anicteric, ETT in place Neuro: RASS -2, following commands, nodding to answer questions CV: S1S2, mechanical click, RRR PULM:  CTAB, breathing comfortably on MV, Pplat 20  GI: soft, NT, hypocative bowel sounds Extremities: no peripheral edema, L tibial IO  Blood culture, 1 site 7/15: pending Blood culture,1 site 7/15: pending  Bicarb 19 BUN 40 Cr 2.39 AST 4155 ALT 1238 Bili 1.8 LA 2.2 WBC 12.3 H/H 10.6/31.1 INR 3.4 Coox 73.5%  TEE: MV vegetation  Resolved Hospital Problem list    Assessment & Plan:   Acute respiratory failure with  hypoxia requiring MV -LTVV -decrease RR with alkalosis -VAP prevention protocol -PAD protocol for sedation -daily SAT & SBT as appropriate  Shock, multifactorial septic & cardiogenic MV endocarditis, mechanical valve> bacteremia that would have been present on admission may not have been captured with so few blood cultures being obtained -vasopressors to maintain MAP >65 -con't antibiotics- cefepime and vanc. -follow blood cultures- unfortunately only 2x 1 site cultures; repeat today -appreciate AHF's  management  Biventricular HFrEF, acute on chronic  -norepi and milrinone> decreased to 0.125 -daily coox -heparin rather than wararin  Prolonged Qtc- chronic -monitor electrolytes; replete K+ & Mg+ -avoid Qtc prolonging meds  MR s/p MVR mechanical valve on coumadin Subtherapeutic INR on admit -hold warfarin, con't heparin -monitor INR  Lactic acidosis due to sepsis, improving AKI due to sepsis Hyponatremia , resolved -strict I/Os -renally dose meds, avoid nephrotoxic meds  Elevated liver enzymes due to sepsis, heart failure -avoid hepatotoxic meds- hold amiodarone today -monitor INR, avoid warfarin for now  Paroxysmal AF, currently in sinus - tele monitoring - monitor electrolytes; replete magnesium  Hypoglycemia -monitor  N/V/D- probably due to sepsis from endocarditis vs heart failure -monitor  At risk for malnutrition -trickle TF  Mother updated at bedside during rounds.  Best Practice (right click and "Reselect all SmartList Selections" daily)   Diet/type: tubefeeds DVT prophylaxis: systemic heparin GI prophylaxis: PPI Lines: Central line and Arterial Line Foley:  Yes, and it is still needed Code Status:  full code Last date of multidisciplinary goals of care discussion [per AHF 7/14]   Labs   CBC: Recent Labs  Lab 12/27/21 1451 12/28/21 0949 12/28/21 1552 12/28/21 1944 12/29/21 0323 12/29/21 0403  WBC 6.2  --   --   --  12.3*  --   NEUTROABS 4.2  --   --   --   --   --   HGB 10.6* 10.9* 13.3 11.6* 10.6* 11.2*  HCT 32.2* 32.0* 39.0 34.0* 31.1* 33.0*  MCV 79.5*  --   --   --  76.2*  --   PLT 369  --   --   --  185  --      Basic Metabolic Panel: Recent Labs  Lab 12/28/21 0748 12/28/21 0949 12/28/21 1034 12/28/21 1552 12/28/21 1640 12/28/21 1944 12/28/21 2040 12/29/21 0323 12/29/21 0403  NA 131*   < > 131*   < > 131* 134* 131* 135 136  K 5.7*   < > 5.6*   < > 5.8* 4.8 4.5 3.9 3.6  CL 97*  --  93*  --  98 100 94* 97*  --   CO2 <7*   --  9*  --  13*  --  16* 19*  --   GLUCOSE <20*  --  159*  --  74 76 84 107*  --   BUN 26*  --  28*  --  33* 33* 37* 40*  --   CREATININE 2.15*  --  2.54*  --  2.66* 2.70* 2.59* 2.39*  --   CALCIUM 8.8*  --  8.4*  --  8.2*  --  8.0* 8.1*  --   MG  --   --  2.4  --   --   --   --  1.6*  --    < > = values in this interval not displayed.    GFR: Estimated Creatinine Clearance: 50.6 mL/min (A) (by C-G formula based on SCr of 2.39 mg/dL (H)). Recent Labs  Lab 12/27/21  1451 12/27/21 1755 12/28/21 1034 12/28/21 1430 12/28/21 1632 12/28/21 2040 12/29/21 0323 12/29/21 0603  PROCALCITON  --   --  17.21  --   --   --  38.27  --   WBC 6.2  --   --   --   --   --  12.3*  --   LATICACIDVEN 2.3*   < > >9.0* >9.0* >9.0* 6.5*  --  2.2*   < > = values in this interval not displayed.     Liver Function Tests: Recent Labs  Lab 12/27/21 1451 12/28/21 0748 12/28/21 2040 12/29/21 0323  AST 110* 258* 2,267* 4,155*  ALT 41 73* 623* 1,238*  ALKPHOS 112 125 127* 132*  BILITOT 0.5 1.2 1.8* 1.8*  PROT 10.2* 9.7* 8.2* 8.2*  ALBUMIN 3.0* 2.8* 2.5* 2.3*    No results for input(s): "LIPASE", "AMYLASE" in the last 168 hours. No results for input(s): "AMMONIA" in the last 168 hours.  ABG    Component Value Date/Time   PHART 7.556 (H) 12/29/2021 0403   PCO2ART 24.2 (L) 12/29/2021 0403   PO2ART 213 (H) 12/29/2021 0403   HCO3 21.5 12/29/2021 0403   TCO2 22 12/29/2021 0403   ACIDBASEDEF 11.0 (H) 12/28/2021 1552   O2SAT 100 12/29/2021 0403     Coagulation Profile: Recent Labs  Lab 12/23/21 1457 12/27/21 1451 12/28/21 1034 12/29/21 0323  INR 1.4* 1.4* 2.2* 3.4*     This patient is critically ill with multiple organ system failure which requires frequent high complexity decision making, assessment, support, evaluation, and titration of therapies. This was completed through the application of advanced monitoring technologies and extensive interpretation of multiple databases. During this  encounter critical care time was devoted to patient care services described in this note for 35 minutes.  Steffanie Dunn, DO 12/29/21 8:55 AM [facility]

## 2021-12-29 NOTE — Progress Notes (Signed)
ANTICOAGULATION CONSULT NOTE  Pharmacy Consult for heparin (coumadin on hold) Indication:  MVR /AFib  No Known Allergies  Patient Measurements: Height: 5\' 10"  (177.8 cm) Weight: 93.7 kg (206 lb 9.1 oz) IBW/kg (Calculated) : 73 Heparin Dosing Weight: 90.7 kg  Vital Signs: Temp: 99.5 F (37.5 C) (07/16 1300) Temp Source: Core (07/16 0400) BP: 112/69 (07/16 1110) Pulse Rate: 88 (07/16 1300)  Labs: Recent Labs    12/27/21 1451 12/27/21 1755 12/28/21 0748 12/28/21 0949 12/28/21 1034 12/28/21 1552 12/28/21 1640 12/28/21 1944 12/28/21 2040 12/29/21 0323 12/29/21 0403 12/29/21 0958 12/29/21 1355  HGB 10.6*  --   --    < >  --    < >  --  11.6*  --  10.6* 11.2*  --   --   HCT 32.2*  --   --    < >  --    < >  --  34.0*  --  31.1* 33.0*  --   --   PLT 369  --   --   --   --   --   --   --   --  185  --   --   --   LABPROT 16.8*  --   --   --  24.5*  --   --   --   --  33.8*  --   --   --   INR 1.4*  --   --   --  2.2*  --   --   --   --  3.4*  --   --   --   HEPARINUNFRC  --   --   --   --   --   --   --   --   --  0.15*  --  0.16* 0.14*  CREATININE 1.40*  --  2.15*  --  2.54*  --  2.66* 2.70* 2.59* 2.39*  --   --   --   TROPONINIHS 257* 210* 350*  --   --   --  978*  --   --   --   --   --   --    < > = values in this interval not displayed.     Estimated Creatinine Clearance: 50.6 mL/min (A) (by C-G formula based on SCr of 2.39 mg/dL (H)).   Medical History: Past Medical History:  Diagnosis Date   Anemia    Autoimmune disorder (HCC)    pyoderma gangrenosum   CHF (congestive heart failure) (HCC)    Chronic systolic heart failure (HCC)    a. EF 35-40% by echo in 07/2018 b. EF at 45% by repeat echo in 04/2020   DVT (deep venous thrombosis) (HCC)    h/o   Dysrhythmia    Mitral regurgitation    a. 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 b. severe, recurrent  MR.   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HCC)    arthritis   Tricuspid regurgitation    Medications:   Scheduled:   Infusions:   ceFEPime (MAXIPIME) IV Stopped (12/29/21 0950)   heparin 1,400 Units/hr (12/29/21 1300)   milrinone 0.125 mcg/kg/min (12/29/21 1300)   norepinephrine (LEVOPHED) Adult infusion 17 mcg/min (12/29/21 1323)   propofol (DIPRIVAN) infusion 10 mcg/kg/min (12/29/21 1402)  vancomycin     PRN:   Assessment: 35 yom with a history of mitral regurgitation s/p valve replacement on Coumadin, HF, AF and prior DVT, and Hx of hidradenitis and pyoderma gangrenosum . Patient presenting with weakness.  Patient taking warfarin pta. Home dose is 4mg  daily per last [facility] note. INR 1.4 on 7/10 instructed to take 6mg  that night and then continue 4mg  daily. Last taken 7/14.  Heparin level low this PM at 0.14 despite rate increase this AM; INR elevated at 3.4, but likely reflective of hepatic dysfunction, not true anticoagulation.  No overt bleeding or complications noted.  Goal of Therapy:  Heparin level: 0.3-0.7 INR 2.5-3.5 Monitor platelets by anticoagulation protocol: Yes   Plan:  Increase heparin gtt to 1550 units/hr. Get heparin level in 6 hours Warfarin on hold  Daily heparin level, CBC and INR.  , , BCCP Clinical Pharmacist  12/29/2021 3:20 PM   [facility] pharmacy phone numbers are listed on [email or web address]

## 2021-12-30 DIAGNOSIS — A419 Sepsis, unspecified organism: Secondary | ICD-10-CM

## 2021-12-30 DIAGNOSIS — N179 Acute kidney failure, unspecified: Secondary | ICD-10-CM | POA: Diagnosis not present

## 2021-12-30 DIAGNOSIS — K529 Noninfective gastroenteritis and colitis, unspecified: Secondary | ICD-10-CM | POA: Diagnosis not present

## 2021-12-30 DIAGNOSIS — Z9911 Dependence on respirator [ventilator] status: Secondary | ICD-10-CM | POA: Diagnosis not present

## 2021-12-30 DIAGNOSIS — R6521 Severe sepsis with septic shock: Secondary | ICD-10-CM

## 2021-12-30 DIAGNOSIS — J9601 Acute respiratory failure with hypoxia: Secondary | ICD-10-CM | POA: Diagnosis not present

## 2021-12-30 LAB — COOXEMETRY PANEL
Carboxyhemoglobin: 1.1 % (ref 0.5–1.5)
Methemoglobin: 1.2 % (ref 0.0–1.5)
O2 Saturation: 61.1 %
Total hemoglobin: 11.1 g/dL — ABNORMAL LOW (ref 12.0–16.0)

## 2021-12-30 LAB — GLUCOSE, CAPILLARY
Glucose-Capillary: 126 mg/dL — ABNORMAL HIGH (ref 70–99)
Glucose-Capillary: 98 mg/dL (ref 70–99)
Glucose-Capillary: 103 mg/dL — ABNORMAL HIGH (ref 70–99)
Glucose-Capillary: 99 mg/dL (ref 70–99)
Glucose-Capillary: 109 mg/dL — ABNORMAL HIGH (ref 70–99)

## 2021-12-30 LAB — BASIC METABOLIC PANEL
Anion gap: 13 (ref 5–15)
BUN: 26 mg/dL — ABNORMAL HIGH (ref 6–20)
CO2: 20 mmol/L — ABNORMAL LOW (ref 22–32)
Calcium: 7.7 mg/dL — ABNORMAL LOW (ref 8.9–10.3)
Chloride: 102 mmol/L (ref 98–111)
Creatinine, Ser: 0.97 mg/dL (ref 0.61–1.24)
GFR, Estimated: >60 mL/min (ref >60)
Glucose, Bld: 114 mg/dL — ABNORMAL HIGH (ref 70–99)
Potassium: 3.8 mmol/L (ref 3.5–5.1)
Sodium: 135 mmol/L (ref 135–145)

## 2021-12-30 LAB — CBC
HCT: 32 % — ABNORMAL LOW (ref 39.0–52.0)
Hemoglobin: 10.5 g/dL — ABNORMAL LOW (ref 13.0–17.0)
MCH: 25.8 pg — ABNORMAL LOW (ref 26.0–34.0)
MCHC: 32.8 g/dL (ref 30.0–36.0)
MCV: 78.6 fL — ABNORMAL LOW (ref 80.0–100.0)
Platelets: 176 10*3/uL (ref 150–400)
RBC: 4.07 MIL/uL — ABNORMAL LOW (ref 4.22–5.81)
RDW: 15 % (ref 11.5–15.5)
WBC: 12.3 10*3/uL — ABNORMAL HIGH (ref 4.0–10.5)
nRBC: 0.6 % — ABNORMAL HIGH (ref 0.0–0.2)

## 2021-12-30 LAB — COMPREHENSIVE METABOLIC PANEL
ALT: 1829 U/L — ABNORMAL HIGH (ref 0–44)
AST: 4576 U/L — ABNORMAL HIGH (ref 15–41)
Albumin: 2.2 g/dL — ABNORMAL LOW (ref 3.5–5.0)
Alkaline Phosphatase: 146 U/L — ABNORMAL HIGH (ref 38–126)
Anion gap: 10 (ref 5–15)
BUN: 31 mg/dL — ABNORMAL HIGH (ref 6–20)
CO2: 23 mmol/L (ref 22–32)
Calcium: 8 mg/dL — ABNORMAL LOW (ref 8.9–10.3)
Chloride: 102 mmol/L (ref 98–111)
Creatinine, Ser: 1.33 mg/dL — ABNORMAL HIGH (ref 0.61–1.24)
GFR, Estimated: >60 mL/min (ref >60)
Glucose, Bld: 130 mg/dL — ABNORMAL HIGH (ref 70–99)
Potassium: 4.1 mmol/L (ref 3.5–5.1)
Sodium: 135 mmol/L (ref 135–145)
Total Bilirubin: 2.2 mg/dL — ABNORMAL HIGH (ref 0.3–1.2)
Total Protein: 8.2 g/dL — ABNORMAL HIGH (ref 6.5–8.1)

## 2021-12-30 LAB — MAGNESIUM: Magnesium: 2.6 mg/dL — ABNORMAL HIGH (ref 1.7–2.4)

## 2021-12-30 LAB — HEPARIN LEVEL (UNFRACTIONATED)
Heparin Unfractionated: 0.2 IU/mL — ABNORMAL LOW (ref 0.30–0.70)
Heparin Unfractionated: 0.22 IU/mL — ABNORMAL LOW (ref 0.30–0.70)

## 2021-12-30 LAB — PHOSPHORUS
Phosphorus: 1.7 mg/dL — ABNORMAL LOW (ref 2.5–4.6)
Phosphorus: 2.4 mg/dL — ABNORMAL LOW (ref 2.5–4.6)

## 2021-12-30 LAB — PROTIME-INR
INR: 4 — ABNORMAL HIGH (ref 0.8–1.2)
Prothrombin Time: 38.3 seconds — ABNORMAL HIGH (ref 11.4–15.2)

## 2021-12-30 LAB — LACTIC ACID, PLASMA: Lactic Acid, Venous: 1.8 mmol/L (ref 0.5–1.9)

## 2021-12-30 LAB — PROCALCITONIN: Procalcitonin: 23.85 ng/mL

## 2021-12-30 MED ORDER — BUSPIRONE HCL 10 MG PO TABS
30.0000 mg | ORAL_TABLET | Freq: Three times a day (TID) | ORAL | Status: DC | PRN
  Administered 2021-12-30 – 2021-12-31 (×2): 30 mg
  Filled 2021-12-30 (×2): qty 3

## 2021-12-30 MED ORDER — VANCOMYCIN HCL 750 MG/150ML IV SOLN
750.0000 mg | Freq: Three times a day (TID) | INTRAVENOUS | Status: DC
Start: 2021-12-30 — End: 2022-01-07
  Administered 2021-12-30 – 2022-01-07 (×25): 750 mg via INTRAVENOUS
  Filled 2021-12-30 (×26): qty 150

## 2021-12-30 MED ORDER — VITAL 1.5 CAL PO LIQD
1000.0000 mL | ORAL | Status: DC
  Administered 2021-12-30 – 2022-01-02 (×4): 1000 mL

## 2021-12-30 MED ORDER — POTASSIUM & SODIUM PHOSPHATES 280-160-250 MG PO PACK
2.0000 | PACK | Freq: Once | ORAL | Status: AC
Start: 2021-12-30 — End: 2021-12-30
  Administered 2021-12-30: 2
  Filled 2021-12-30: qty 2

## 2021-12-30 MED ORDER — SODIUM PHOSPHATES 45 MMOLE/15ML IV SOLN
15.0000 mmol | Freq: Once | INTRAVENOUS | Status: AC
  Administered 2021-12-30: 15 mmol via INTRAVENOUS
  Filled 2021-12-30: qty 5

## 2021-12-30 MED ORDER — PROSOURCE TF PO LIQD
45.0000 mL | Freq: Three times a day (TID) | ORAL | Status: DC
  Administered 2021-12-30 – 2022-01-05 (×18): 45 mL
  Filled 2021-12-30 (×19): qty 45

## 2021-12-30 MED ORDER — SODIUM CHLORIDE 0.9 % IV SOLN
2.0000 g | Freq: Three times a day (TID) | INTRAVENOUS | Status: DC
  Administered 2021-12-30 – 2022-01-17 (×55): 2 g via INTRAVENOUS
  Filled 2021-12-30 (×55): qty 12.5

## 2021-12-30 MED ORDER — ACETAMINOPHEN 160 MG/5ML PO SOLN
650.0000 mg | Freq: Once | ORAL | Status: AC
  Administered 2021-12-30: 650 mg
  Filled 2021-12-30: qty 20.3

## 2021-12-30 NOTE — Progress Notes (Addendum)
ANTICOAGULATION CONSULT NOTE  Pharmacy Consult for heparin (coumadin on hold) Indication:  MVR /AFib  No Known Allergies  Patient Measurements: Height: 5\' 10"  (177.8 cm) Weight: 89.2 kg (196 lb 10.4 oz) IBW/kg (Calculated) : 73 Heparin Dosing Weight: 90.7 kg  Vital Signs: Temp: 98.4 F (36.9 C) (07/17 1800) BP: 108/76 (07/17 1506) Pulse Rate: 82 (07/17 1800)  Labs: Recent Labs    12/28/21 0748 12/28/21 0949 12/28/21 1034 12/28/21 1552 12/28/21 1640 12/28/21 1944 12/29/21 0323 12/29/21 0403 12/29/21 0958 12/29/21 2042 12/29/21 2219 12/30/21 0150 12/30/21 0805 12/30/21 1300 12/30/21 1707  HGB  --    < >  --    < >  --    < > 10.6* 11.2*  --  11.6*  --  10.5*  --   --   --   HCT  --    < >  --    < >  --    < > 31.1* 33.0*  --  34.0*  --  32.0*  --   --   --   PLT  --   --   --   --   --   --  185  --   --   --   --  176  --   --   --   LABPROT  --   --  24.5*  --   --   --  33.8*  --   --   --   --  38.3*  --   --   --   INR  --   --  2.2*  --   --   --  3.4*  --   --   --   --  4.0*  --   --   --   HEPARINUNFRC  --   --   --   --   --   --  0.15*  --    < >  --  0.17*  --  0.20*  --  0.22*  CREATININE 2.15*  --  2.54*  --  2.66*   < > 2.39*  --   --   --   --  1.33*  --  0.97  --   TROPONINIHS 350*  --   --   --  978*  --   --   --   --   --   --   --   --   --   --    < > = values in this interval not displayed.     Estimated Creatinine Clearance: 121.8 mL/min (by C-G formula based on SCr of 0.97 mg/dL).   Medical History: Past Medical History:  Diagnosis Date   Anemia    Autoimmune disorder (HCC)    pyoderma gangrenosum   CHF (congestive heart failure) (HCC)    Chronic systolic heart failure (HCC)    a. EF 35-40% by echo in 07/2018 b. EF at 45% by repeat echo in 04/2020   DVT (deep venous thrombosis) (HCC)    h/o   Dysrhythmia    Mitral regurgitation    a. s/p MV repair with resection of ruptured anterior papillary muscle and reconstruction of  papillary chord and placement of annuloplasty ring in 2019. b. 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HCC)    arthritis   Tricuspid regurgitation  Medications:   Scheduled:   Infusions:   sodium chloride 10 mL/hr at 12/30/21 1700   sodium chloride 10 mL/hr at 12/30/21 1700   sodium chloride 10 mL/hr at 12/29/21 2041   ceFEPime (MAXIPIME) IV Stopped (12/30/21 1444)   feeding supplement (VITAL 1.5 CAL)     heparin 1,950 Units/hr (12/30/21 1809)   milrinone 0.125 mcg/kg/min (12/30/21 1700)   norepinephrine (LEVOPHED) Adult infusion Stopped (12/30/21 0630)   propofol (DIPRIVAN) infusion 25 mcg/kg/min (12/30/21 1700)   vancomycin 750 mg (12/30/21 1712)   Assessment: 35 yom with a history of mitral regurgitation s/p valve replacement on Coumadin, HF, AF and prior DVT, and Hx of hidradenitis and pyoderma gangrenosum . Patient presenting with weakness.  Patient taking warfarin pta. Home dose is 4mg  daily per last [facility]. INR 1.4 on 7/10 instructed to take 6mg  that night and then continue 4mg  daily. Last taken 7/14.  INR continues to be elevated at 4, but likely reflective of hepatic dysfunction, not true anticoagulation.    Heparin level remains low at 0.22 despite rate increase. Pt did experience a nose bleed this morning but has since resolved. Will continue to get updates from nurse and assess rate as needed.   Goal of Therapy:  Heparin level: 0.3-0.7 units/mL (targeting low end of goal range) Monitor platelets by anticoagulation protocol: Yes   Plan:  Increase heparin rate to 2100 units/hr Order heparin level 6 hours after rate change  Continue to hold Warfarin, will follow up need for vitamin k but hesitant with mechanical valve Order a daily heparin level, CBC and INR.  Thanks for involving pharmacy in this patient's care.   , PharmD   PGY1 Pharmacy Resident

## 2021-12-30 NOTE — Progress Notes (Signed)
Initial Nutrition Assessment  DOCUMENTATION CODES:   Not applicable  INTERVENTION:   Tube Feeding via OG:  Vital 1.5 at 60 ml/hr Current TF at 20, increase to 30 ml/hr, titrate by 10 mL q 8 hours until goal rate of 60 ml/hr Pro-Source TF 45 mL TID Goal regimen provides 130 g of protein, 2280 kcals and 1094 mL of free water   NUTRITION DIAGNOSIS:   Inadequate oral intake related to acute illness as evidenced by NPO status.  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3 kg on admission. Net negative 2L. +nonpitting edema on exam. Unsure of dry weight.   Limited nutrition focused physical exam due to pads from [facility]. Plan to repeat as able  Labs:phosphorus 2.4, Creatinine wdl, CBGs 98-123, elevated LFTs Meds: miralax, sodiuim phosphate, phosnak  NUTRITION - FOCUSED PHYSICAL EXAM:  Flowsheet Row Most Recent Value  Orbital Region No depletion  Upper Arm Region Unable to assess  Thoracic and Lumbar Region Unable to assess  Buccal Region Unable to assess  Temple Region No depletion   Clavicle Bone Region No depletion  Clavicle and Acromion Bone Region No depletion  Scapular Bone Region Unable to assess  Dorsal Hand Unable to assess  Patellar Region Unable to assess  Anterior Thigh Region Unable to assess  Posterior Calf Region Unable to assess  Edema (RD Assessment) Mild       Diet Order:   Diet Order             Diet NPO time specified  Diet effective now                   EDUCATION NEEDS:   Not appropriate for education at this time  Skin:  Skin Assessment: Skin Integrity Issues: Skin Integrity Issues:: Other (Comment) Other: Chronic wounds related to hidradenitis suppurativa to  bilateral LE, buttocks  Last BM:  7/15  Height:   Ht Readings from Last 1 Encounters:  12/27/21 5\' 10"  (1.778 m)    Weight:   Wt Readings from Last 1 Encounters:  12/30/21 89.2 kg    BMI:  Body mass index is 28.22 kg/m.  Estimated Nutritional Needs:   Kcal:  2200-2400 kcals  Protein:  115-140 g  Fluid:  <1.8 L   01/01/22 MS, RDN, LDN, CNSC Registered Dietitian 3 Clinical Nutrition RD Pager and On-Call Pager Number Located in [place]

## 2021-12-30 NOTE — Progress Notes (Signed)
Heart Failure Navigator Progress Note  Assessed for Heart & Vascular TOC clinic readiness.  Patient does not meet criteria due to Advanced Heart Failure Team..     Shayden Gingrich, BSN, RN Heart Failure Nurse Navigator Secure Chat Only   

## 2021-12-30 NOTE — Progress Notes (Addendum)
eLink Physician-Brief Progress Note Patient Name: Donald [name] DOB: [birth year] MRN: [id number]   Date of Service  12/30/2021  HPI/Events of Note  Notified of rising temperatures. Had been given tylenol yesterday.   eICU Interventions  Noted LFT earlier and I discontinued the tylenol order Repeated labs  RN has already attempted multiple ice packs, cooling fans and in fact the fever has now increased Repeat labs with worsening LFT so do not think tylenol is a good idea Continues to have AKI, and is on a heparin drip, do not think NSAIDs are a good idea either Cooling blanket not available at this time Will attempt cooling with Arctic sun till temp of 37.5C  is reached and then stop it - do not see other options at this time since fever continues to increase D/w RN  LA normal and of note, pressor needs down from 15 mic/min of levophed to 10 mic/min , has already had cultures done and is on antibiotics      Intervention Category Major Interventions: Sepsis - evaluation and management  Naomy Esham G Sahas Sluka 12/30/2021, 3:32 AM  Addendum at 3:50 am Phos noted Will replace with 15 mmol of sodium phos  Recheck bmp and phos at 1 pm ordered

## 2021-12-30 NOTE — Consult Note (Signed)
[facility]       Reason for Consult: prosthetic valve endocarditis    Referring Physician: Dr. Gala Romney  Principal Problem:   Gastroenteritis Active Problems:   Chronic heart failure (HCC)   Elevated troponin   Elevated serum creatinine   Prolonged QT interval   Elevated AST (SGOT)   Paroxysmal atrial flutter (HCC)   H/O mitral valve replacement   Microcytic anemia   Hyponatremia   Acute febrile illness   Anticoagulated on Coumadin   Dehydration   Elevated lactic acid level   Nausea vomiting and diarrhea   Sepsis (HCC)    Chlorhexidine Gluconate Cloth  6 each Topical Daily   feeding supplement (PROSource TF)  45 mL Per Tube BID   feeding supplement (VITAL HIGH PROTEIN)  1,000 mL Per Tube Q24H   mouth rinse  15 mL Mouth Rinse Q2H   pantoprazole sodium  40 mg Per Tube Daily   polyethylene glycol  17 g Per Tube Daily    Recommendations: Continue with vancomycin and cefepime Will monitor blood cultures  Assessment: MV vegetation in the setting of a 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  No positive cultures and will have low yield at this point on antibiotics.  At this time, I would not add rifampin since the organism is unclear and he needs anticoagulation.      HPI: Donald [name] is a 36 y.o. male with a history of mitral regurgitation, s/p mitral valve replacement with a mechanical valve came in with fever and poor po intake.  Patient is currently intubated on the ventilator.  He underwent MV repair with resection of ruptured anterior papillary muscle and reconstruction of the papillary chord and placement of annuloplasty ring in 2019 followed by MV replacement at [facility] in 2022.  TEE done by Dr. Gala Romney on 7/15 and mitral valve noted to have a vegetation.  One set of blood cultures sent on 7/14 and one set on 7/15 after antibiotics had been started.   Currently on vancomycin and cefepime.   Review of Systems:  Unable to  be assessed due to patient factors  Past Medical History:  Diagnosis Date   Anemia    Autoimmune disorder (HCC)    pyoderma gangrenosum   CHF (congestive heart failure) (HCC)    Chronic systolic heart failure (HCC)    a. EF 35-40% by echo in 07/2018 b. EF at 45% by repeat echo in 04/2020   DVT (deep venous thrombosis) (HCC)    h/o   Dysrhythmia    Mitral regurgitation    a. 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 b. 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HCC)    arthritis   Tricuspid regurgitation     Social History   Tobacco Use   Smoking status: Former    Types: Cigarettes    Quit date: 10/14/2020    Years since quitting: 1.2   Smokeless tobacco: Never  Vaping Use   Vaping Use: Never used  Substance Use Topics   Alcohol use: No   Drug use: No    Family History  Problem Relation Age of Onset   Multiple sclerosis Mother    Psoriasis Mother    Depression Father    Diabetes Father  Diabetes Paternal Grandmother     No Known Allergies  Physical Exam: Constitutional: intubated, sedated  Vitals:   12/30/21 1112 12/30/21 1200  BP:    Pulse: 99 98  Resp: (!) 28 (!) 30  Temp:  (!) 100.9 F (38.3 C)  SpO2: 100% 99%   EYES: anicteric ENMT: +ET Cardiovascular: Cor RRR Respiratory: respiratory effort on vent; CTA GI: soft Musculoskeletal: no edema Skin: no rash  Lab Results  Component Value Date   WBC 12.3 (H) 12/30/2021   HGB 10.5 (L) 12/30/2021   HCT 32.0 (L) 12/30/2021   MCV 78.6 (L) 12/30/2021   PLT 176 12/30/2021    Lab Results  Component Value Date   CREATININE 1.33 (H) 12/30/2021   BUN 31 (H) 12/30/2021   NA 135 12/30/2021   K 4.1 12/30/2021   CL 102 12/30/2021   CO2 23 12/30/2021    Lab Results  Component Value  Date   ALT 1,829 (H) 12/30/2021   AST 4,576 (H) 12/30/2021   ALKPHOS 146 (H) 12/30/2021     Microbiology: Recent Results (from the past 240 hour(s))  Culture, blood (routine x 2)     Status: None (Preliminary result)   Collection Time: 12/27/21  2:52 PM   Specimen: BLOOD  Result Value Ref Range Status   Specimen Description BLOOD RIGHT ANTECUBITAL  Final   Special Requests   Final    BOTTLES DRAWN AEROBIC AND ANAEROBIC Blood Culture adequate volume   Culture   Final    NO GROWTH 2 DAYS Performed at [facility], [address], [place], [place] [address]    Report Status PENDING  Incomplete  Resp Panel by RT-PCR (Flu A&B, Covid) Anterior Nasal Swab     Status: None   Collection Time: 12/27/21  6:04 PM   Specimen: Anterior Nasal Swab  Result Value Ref Range Status   SARS Coronavirus 2 by RT PCR NEGATIVE NEGATIVE Final    Comment: (NOTE) SARS-CoV-2 target nucleic acids are NOT DETECTED.  The SARS-CoV-2 RNA is generally detectable in upper respiratory specimens during the acute phase of infection. The lowest concentration of SARS-CoV-2 viral copies this assay can detect is 138 copies/mL. A negative result does not preclude SARS-Cov-2 infection and should not be used as the sole basis for treatment or other patient management decisions. A negative result may occur with  improper specimen collection/handling, submission of specimen other than nasopharyngeal swab, presence of viral mutation(s) within the areas targeted by this assay, and inadequate number of viral copies(<138 copies/mL). A negative result must be combined with clinical observations, patient history, and epidemiological information. The expected result is Negative.  Fact Sheet for Patients:  BloggerCourse.com  Fact Sheet for Healthcare Providers:  SeriousBroker.it  This test is no t yet approved or cleared by the [place] FDA and  has been authorized  for detection and/or diagnosis of SARS-CoV-2 by FDA under an Emergency Use Authorization (EUA). This EUA will remain  in effect (meaning this test can be used) for the duration of the COVID-19 declaration under Section 564(b)(1) of the Act, 21 U.S.C.section 360bbb-3(b)(1), unless the authorization is terminated  or revoked sooner.       Influenza A by PCR NEGATIVE NEGATIVE Final   Influenza B by PCR NEGATIVE NEGATIVE Final    Comment: (NOTE) The Xpert Xpress SARS-CoV-2/FLU/RSV plus assay is intended as an aid in the diagnosis of influenza from Nasopharyngeal swab specimens and should not be used as a sole basis for treatment. Nasal  washings and aspirates are unacceptable for Xpert Xpress SARS-CoV-2/FLU/RSV testing.  Fact Sheet for Patients: BloggerCourse.com  Fact Sheet for Healthcare Providers: SeriousBroker.it  This test is not yet approved or cleared by the [place] FDA and has been authorized for detection and/or diagnosis of SARS-CoV-2 by FDA under an Emergency Use Authorization (EUA). This EUA will remain in effect (meaning this test can be used) for the duration of the COVID-19 declaration under Section 564(b)(1) of the Act, 21 U.S.C. section 360bbb-3(b)(1), unless the authorization is terminated or revoked.  Performed at [facility], [address] [place], [place] [address]   Culture, blood (Routine X 2) w Reflex to ID Panel     Status: None (Preliminary result)   Collection Time: 12/28/21 10:45 AM   Specimen: BLOOD  Result Value Ref Range Status   Specimen Description BLOOD CENTRAL LINE  Final   Special Requests   Final    BOTTLES DRAWN AEROBIC AND ANAEROBIC Blood Culture adequate volume   Culture   Final    NO GROWTH 2 DAYS Performed at [facility], [address] [place], [place] [address]    Report Status PENDING  Incomplete  Culture, Respiratory w Gram Stain     Status: None (Preliminary  result)   Collection Time: 12/29/21  9:00 AM   Specimen: Tracheal Aspirate; Respiratory  Result Value Ref Range Status   Specimen Description TRACHEAL ASPIRATE  Final   Special Requests NONE  Final   Gram Stain   Final    MODERATE WBC PRESENT,BOTH PMN AND MONONUCLEAR NO ORGANISMS SEEN    Culture   Final    NO GROWTH < 24 HOURS Performed at [facility], [address] [place], [place] [address]    Report Status PENDING  Incomplete  Culture, blood (Routine X 2) w Reflex to ID Panel     Status: None (Preliminary result)   Collection Time: 12/29/21  9:58 AM   Specimen: BLOOD LEFT HAND  Result Value Ref Range Status   Specimen Description BLOOD LEFT HAND  Final   Special Requests   Final    BOTTLES DRAWN AEROBIC AND ANAEROBIC Blood Culture adequate volume   Culture   Final    NO GROWTH < 24 HOURS Performed at [facility], [address], [place], [place] [address]    Report Status PENDING  Incomplete  Culture, blood (Routine X 2) w Reflex to ID Panel     Status: None (Preliminary result)   Collection Time: 12/29/21  9:58 AM   Specimen: BLOOD LEFT ARM  Result Value Ref Range Status   Specimen Description BLOOD LEFT ARM  Final   Special Requests   Final    BOTTLES DRAWN AEROBIC AND ANAEROBIC Blood Culture results may not be optimal due to an inadequate volume of blood received in culture bottles   Culture   Final    NO GROWTH < 24 HOURS Performed at [facility], [address], [place], [place] [address]    Report Status PENDING  Incomplete    Gardiner Barefoot, MD [facility] [email or web address] 12/30/2021, 12:24 PM

## 2021-12-30 NOTE — Consult Note (Signed)
WOC Nurse Consult Note: Patient receiving care in [facility] 2H01 Reason for Consult: Multiple blisters on buttocks Wound type: Chronic Hidradenitis Suppurativa Pressure Injury POA: NA Measurement: Deferred Wound bed: scattered open lesions with serosanguinous drainage.  Dressing procedure/placement/frequency: Clindamycin is a topical recommendation for this condition but contraindicated at this time due to other ABX treatments. Patient is intubated and sedated but did wake up at one point while being turned.  At this point the recommendation is to continue the Aquacel Advantage Hydrofiber for absorptive and antimicrobial properties. Secure with ABD pads and Medipore tape. This should be changed twice daily or PRN soiling.    Monitor the wound area(s) for worsening of condition such as: Signs/symptoms of infection, increase in size, development of or worsening of odor, development of pain, or increased pain at the affected locations.   Notify the medical team if any of these develop.  Thank you for the consult. WOC nurse will not follow at this time.   Please re-consult the WOC team if needed.  Renaldo Reel Katrinka Blazing, MSN, RN, CMSRN, Angus Seller, [facility] Treatment Associate Pager [phone]

## 2021-12-30 NOTE — Progress Notes (Addendum)
Pharmacy Antibiotic Note  Donald [name] is a 36 y.o. male for which pharmacy has been consulted for cefepime and vancomycin dosing for sepsis.  Patient with a history of mitral regurgitation s/p valve replacement on Coumadin, HF, AF and prior DVT, and Hx of hidradenitis and pyoderma gangrenosum . Patient presenting with shock.  Scr down significantly from 2.3>1.33 to 2.39; great uop overnight. Febrile over the past 24 hours to 102.4, wbc slightly elevated at 12.3, stable overnight. Nothing significant growing on cultures.   Plan: Increase cefepime 2g q 8 hrs Increase Vancomycin to 750 mg q 8 hours -will plan on checking levels in the next 24-48 hours  -New eAUC 487 using scr 1.33 F/u cultures, renal function and clinical course.  Height: 5\' 10"  (177.8 cm) Weight: 89.2 kg (196 lb 10.4 oz) IBW/kg (Calculated) : 73  Temp (24hrs), Avg:100.3 F (37.9 C), Min:97.5 F (36.4 C), Max:102.4 F (39.1 C)  Recent Labs  Lab 12/27/21 1451 12/27/21 1755 12/28/21 1430 12/28/21 1632 12/28/21 1640 12/28/21 1944 12/28/21 2040 12/29/21 0323 12/29/21 0603 12/30/21 0150  WBC 6.2  --   --   --   --   --   --  12.3*  --  12.3*  CREATININE 1.40*   < >  --   --  2.66* 2.70* 2.59* 2.39*  --  1.33*  LATICACIDVEN 2.3*   < > >9.0* >9.0*  --   --  6.5*  --  2.2* 1.8  VANCORANDOM  --   --   --   --   --   --   --  22  --   --    < > = values in this interval not displayed.     Estimated Creatinine Clearance: 88.8 mL/min (A) (by C-G formula based on SCr of 1.33 mg/dL (H)).    No Known Allergies  Antimicrobials this admission: cefepime 7/14 >>  vancomycin 7/14 >>  metronidazole 7/14 >> 7/14  Microbiology results: *FYI it looks like only 1 cx on 7/14 prior to abx - several orders "cancelled" 7/15 BCx x 2 > ngtd 7/14 BCx  ngtd 7/16 Resp Cx > ngtd  Thank you for allowing pharmacy to be a part of this patient's care.  8/16 PharmD., BCPS Clinical Pharmacist 12/30/2021 7:18 AM

## 2021-12-30 NOTE — Progress Notes (Signed)
NAME:  Donald [name], MRN:  [id number], DOB:  24-Jan-1986, LOS: 2 ADMISSION DATE:  12/27/2021, CONSULTATION DATE:  12/28/21 REFERRING MD:  Dr. 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    Vitals and O2 saturations stable on admit with labs significant for subtherapeutic INR 1.4, Na 127, sCr 1.4, bicarb 18, AST 110, Hgb 10.6, trop hs 257> 210, LA 2.3> 1.7, CXR neg, EKG ST with prolonged Qtc.  CT A/P normal.  Given concern for sepsis, cultures sent, started empirically on vanc/ cefepime/ flagyl and given 1L LR bolus.  He was admitted to [facility].  Overnight and this morning, he developed worsening SOB and tachypnea with saturations stable, BP stable, and given phenergan for vomiting.  Repeat EKG this am showed new STD in lateral leads.  Patient difficult stick, repeat labs pending.  Extremities now cold.  Stat TTE pending.  Cards/ HF consulted.  PCCM consulted for concern of worsening shock despite ok BP and ICU transfer.    Pertinent  Medical History  Biventricular HF, EF 20-25%- followed by Dr. Gala Romney Afib MR s/p MV repair w/ resection of ruptured anterior papillary muscle and reconstruction of papillary chord and placement of annuloplasty ring 2019, but do to recurrent MR, underwent MVR w/ mechanical valve at [facility] 05/2021 Anemia Pyoderma gangrenosum  Significant Hospital Events: Including procedures, antibiotic start and stop dates in addition to other pertinent events   7/14 admitted FPTS for sepsis, N/V. Started on flagyl, cefepime, vanc 7/15 worsening shock> ICU> intubated, pressors, abx. Flagyl stopped. 7/16 decreasing milrinone for high CO and coox   Interim History / Subjective:  Overnight febrile, more  tachycardic  Objective   Blood pressure 121/82, pulse 90, temperature (!) 100.6 F (38.1 C), resp. rate (!) 31, height 5\' 10"  (1.778 m), weight 89.2 kg, SpO2 93 %. PAP: (44-60)/(29-43) 52/34 CVP:  [5 mmHg-13 mmHg] 10 mmHg CO:  [4.8 L/min-7.2 L/min] 4.8 L/min CI:  [2.3 L/min/m2-3.5 L/min/m2] 2.3 L/min/m2  Vent Mode: PRVC FiO2 (%):  [30 %-40 %] 30 % Set Rate:  [15 bmp-20 bmp] 15 bmp Vt Set:  [580 mL] 580 mL PEEP:  [5 cmH20-8 cmH20] 5 cmH20 Plateau Pressure:  [17 cmH20-21 cmH20] 17 cmH20   Intake/Output Summary (Last 24 hours) at 12/30/2021 0707 Last data filed at 12/30/2021 0615 Gross per 24 hour  Intake 2008.91 ml  Output 3360 ml  Net -1351.09 ml    Filed Weights   12/28/21 0405 12/29/21 0242 12/30/21 0432  Weight: 93.4 kg 93.7 kg 89.2 kg    Examination: General:  critically ill appearing man lying in bed in NAD, ice packs and cooling pads on him, shivering. HEENT:  /AT, eyes anicteric Neuro: RASS -4, strong cough reflex, PERRL CV: S1S2, tachycardic, reg rhythm PULM:  tachypneic breathing over the vent, RR in high 30s on PS 8/CPAP 5. No significant ETT secretions. CTAB. GI:  soft, NT Extremities: Mild pedal edema, no cyanosis.  Blood culture, 1 site 7/15: NGTD Blood culture,1 site 7/15: NGTD Resp culture: NG  Bicarb 23 BUN 31 Cr 1.33 AST 4576 ALT 1829 Bili 2.2 LA 1.8 WBC 12.3 H/H 10.5/32.0 PCT 23.85 INR 4.0 Coox 61.1%  Blood cultures: NGTD  Resolved Hospital Problem list  Hyponatremia Hypoglycemia  Assessment & Plan:   Acute respiratory failure with hypoxia requiring MV -LTVV -decrease RR with alkalosis -VAP prevention protocol -PAD protocol -Daily SAT & SBT as appropriate- failed today due to high RR, probably fever related.  Shock, multifactorial septic & cardiogenic MV endocarditis, mechanical valve> bacteremia that would have been present on admission may not have been captured with so few blood cultures being obtained -con't milrinone,  norepi. Wean NE to maintain MAP >65 -Con't broad antibiotics- cefepime and vancomycin. -follow blood cultures- unfortunately only 2x 1 site cultures from early in hospital course. Repeat from 7/16 pending. 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 -con't inotropic support per AHF team -daily coox -con't heparin until more stable to resume warfarin  Prolonged Qtc- chronic -Monitor and replete electrolytes as needed - Avoid QTc prolonging medications  Hypophosphatemia - Repleted - Continue to monitor  MR s/p MVR mechanical valve on coumadin Subtherapeutic INR on admit -hold warfarin, con't heparin -monitor INR  Lactic acidosis due to sepsis, improving AKI due to sepsis-improving -Strict I's/O - Renally dose medications and avoid nephrotoxic meds - Monitor  Elevated liver enzymes due to sepsis, heart failure -Avoid hepatotoxic meds-continue holding amiodarone and monitor heart rate - Continue monitoring INR and LFTs.  Avoid warfarin for now.  Paroxysmal AF, currently in sinus tach with fevers -Continue telemetry monitoring - Monitor electrolytes and replete as needed  Mild hyperglycemia - Monitor, not currently requiring insulin  N/V/D- probably due to sepsis from endocarditis vs heart failure -monitor  At risk for malnutrition -Continue tube feeds  Mother updated at bedside during rounds this morning.  Best Practice (right click and "Reselect all SmartList Selections" daily)   Diet/type: tubefeeds DVT prophylaxis: systemic heparin GI prophylaxis: PPI Lines: Central line and Arterial Line Foley:  Yes, and it is still needed Code Status:  full code Last date of multidisciplinary goals of care discussion [per AHF 7/14]   Labs   CBC: Recent Labs  Lab 12/27/21 1451 12/28/21 0949 12/28/21 1944 12/29/21 0323 12/29/21 0403 12/29/21 2042  12/30/21 0150  WBC 6.2  --   --  12.3*  --   --  12.3*  NEUTROABS 4.2  --   --   --   --   --   --   HGB 10.6*   < > 11.6* 10.6* 11.2* 11.6* 10.5*  HCT 32.2*   < > 34.0* 31.1* 33.0* 34.0* 32.0*  MCV 79.5*  --   --  76.2*  --   --  78.6*  PLT 369  --   --  185  --   --  176   < > = values in this interval not displayed.     Basic Metabolic Panel: Recent Labs  Lab 12/28/21 1034 12/28/21 1552 12/28/21 1640 12/28/21 1944 12/28/21 2040 12/29/21 0323 12/29/21 0403 12/29/21 2042 12/30/21 0150  NA 131*   < > 131* 134* 131* 135 136 136 135  K 5.6*   < > 5.8* 4.8 4.5 3.9 3.6 4.0 4.1  CL 93*  --  98 100 94* 97*  --   --  102  CO2 9*  --  13*  --  16* 19*  --   --  23  GLUCOSE 159*  --  74 76 84 107*  --   --  130*  BUN 28*  --  33* 33* 37* 40*  --   --  31*  CREATININE 2.54*  --  2.66* 2.70* 2.59* 2.39*  --   --  1.33*  CALCIUM 8.4*  --  8.2*  --  8.0* 8.1*  --   --  8.0*  MG 2.4  --   --   --   --  1.6*  --   --  2.6*  PHOS  --   --   --   --   --   --   --   --  1.7*   < > = values in this interval not displayed.    GFR: Estimated Creatinine Clearance: 88.8 mL/min (A) (by C-G formula based on SCr of 1.33 mg/dL (H)). Recent Labs  Lab 12/27/21 1451 12/27/21 1755 12/28/21 1034 12/28/21 1430 12/28/21 1632 12/28/21 2040 12/29/21 0323 12/29/21 0603 12/30/21 0150  PROCALCITON  --   --  17.21  --   --   --  38.27  --  23.85  WBC 6.2  --   --   --   --   --  12.3*  --  12.3*  LATICACIDVEN 2.3*   < > >9.0*   < > >9.0* 6.5*  --  2.2* 1.8   < > = values in this interval not displayed.     Liver Function Tests: Recent Labs  Lab 12/27/21 1451 12/28/21 0748 12/28/21 2040 12/29/21 0323 12/30/21 0150  AST 110* 258* 2,267* 4,155* 4,576*  ALT 41 73* 623* 1,238* 1,829*  ALKPHOS 112 125 127* 132* 146*  BILITOT 0.5 1.2 1.8* 1.8* 2.2*  PROT 10.2* 9.7* 8.2* 8.2* 8.2*  ALBUMIN 3.0* 2.8* 2.5* 2.3* 2.2*    No results for input(s): "LIPASE", "AMYLASE" in the last 168 hours. No  results for input(s): "AMMONIA" in the last 168 hours.  ABG    Component Value Date/Time   PHART 7.414 12/29/2021 2042   PCO2ART 38.0 12/29/2021 2042   PO2ART 125 (H) 12/29/2021 2042   HCO3 24.0 12/29/2021 2042   TCO2 25 12/29/2021 2042   ACIDBASEDEF 11.0 (H) 12/28/2021 1552   O2SAT 61.1 12/30/2021 0208     Coagulation Profile: Recent Labs  Lab 12/23/21 1457 12/27/21 1451 12/28/21 1034 12/29/21 0323 12/30/21 0150  INR 1.4* 1.4* 2.2* 3.4* 4.0*     This patient is critically ill with multiple organ system failure which requires frequent high complexity decision making, assessment, support, evaluation, and titration of therapies. This was completed through the application of advanced monitoring technologies and extensive interpretation of multiple databases. During this encounter critical care time was devoted to patient care services described in this note for 40 minutes.  Steffanie Dunn, DO 12/30/21 9:06 AM [facility]

## 2021-12-30 NOTE — Progress Notes (Addendum)
Advanced Heart Failure Rounding Note   Subjective:    Remains intubated/sedated.  On vanc/cefepime. Procalcitonin 17>38>24. Febrile overnight with Tmax 102.4, cooled with Arctic sun. T 100.5 this am.  Milrinone decreased to 0.125 yesterday, NE weaned off.  Lactic acid 9.0 -> 2.2 -> 1.8  LFTS continue to trend up  TEE + vegetation on MV  Swan #s RA 12 PAP 53/35 (42) Therm CO/CI 4.8/2.3 Co-ox 61%  Wakes up and follows commands. RN reports wound on buttocks. Wound care consulted.   Objective:   Weight Range: 206 lb>>196 lb  Vital Signs:   Temp:  [97.5 F (36.4 C)-102.4 F (39.1 C)] 100.6 F (38.1 C) (07/17 0700) Pulse Rate:  [81-102] 90 (07/17 0700) Resp:  [16-31] 31 (07/17 0700) BP: (103-121)/(67-82) 121/82 (07/16 2013) SpO2:  [91 %-100 %] 93 % (07/17 0700) Arterial Line BP: (105-150)/(64-84) 110/77 (07/17 0700) FiO2 (%):  [30 %-40 %] 30 % (07/17 0330) Weight:  [89.2 kg] 89.2 kg (07/17 0432) Last BM Date : 12/28/21  Weight change: Filed Weights   12/28/21 0405 12/29/21 0242 12/30/21 0432  Weight: 93.4 kg 93.7 kg 89.2 kg    Intake/Output:   Intake/Output Summary (Last 24 hours) at 12/30/2021 0705 Last data filed at 12/30/2021 0615 Gross per 24 hour  Intake 2008.91 ml  Output 3360 ml  Net -1351.09 ml    Physical Exam: General:  Intubated and sedated HEENT: + ETT Neck: supple. R IJ SWAN. Carotids 2+ bilat; no bruits.  Cor: PMI nondisplaced. Regular rate & rhythm. No rubs, gallops, mechanical S1 Lungs: clear Abdomen: soft, nondistended. No hepatosplenomegaly.  Extremities: no cyanosis, clubbing, rash, edema Neuro: alert & orientedx3, cranial nerves grossly intact. moves all 4 extremities w/o difficulty. Affect pleasant   Telemetry: SR 90s (personally reviewed)   Labs: Basic Metabolic Panel: Recent Labs  Lab 12/28/21 1034 12/28/21 1552 12/28/21 1640 12/28/21 1944 12/28/21 2040 12/29/21 0323 12/29/21 0403 12/29/21 2042 12/30/21 0150  NA  131*   < > 131* 134* 131* 135 136 136 135  K 5.6*   < > 5.8* 4.8 4.5 3.9 3.6 4.0 4.1  CL 93*  --  98 100 94* 97*  --   --  102  CO2 9*  --  13*  --  16* 19*  --   --  23  GLUCOSE 159*  --  74 76 84 107*  --   --  130*  BUN 28*  --  33* 33* 37* 40*  --   --  31*  CREATININE 2.54*  --  2.66* 2.70* 2.59* 2.39*  --   --  1.33*  CALCIUM 8.4*  --  8.2*  --  8.0* 8.1*  --   --  8.0*  MG 2.4  --   --   --   --  1.6*  --   --  2.6*  PHOS  --   --   --   --   --   --   --   --  1.7*   < > = values in this interval not displayed.    Liver Function Tests: Recent Labs  Lab 12/27/21 1451 12/28/21 0748 12/28/21 2040 12/29/21 0323 12/30/21 0150  AST 110* 258* 2,267* 4,155* 4,576*  ALT 41 73* 623* 1,238* 1,829*  ALKPHOS 112 125 127* 132* 146*  BILITOT 0.5 1.2 1.8* 1.8* 2.2*  PROT 10.2* 9.7* 8.2* 8.2* 8.2*  ALBUMIN 3.0* 2.8* 2.5* 2.3* 2.2*   No results for input(s): "LIPASE", "AMYLASE"  in the last 168 hours. No results for input(s): "AMMONIA" in the last 168 hours.  CBC: Recent Labs  Lab 12/27/21 1451 12/28/21 0949 12/28/21 1944 12/29/21 0323 12/29/21 0403 12/29/21 2042 12/30/21 0150  WBC 6.2  --   --  12.3*  --   --  12.3*  NEUTROABS 4.2  --   --   --   --   --   --   HGB 10.6*   < > 11.6* 10.6* 11.2* 11.6* 10.5*  HCT 32.2*   < > 34.0* 31.1* 33.0* 34.0* 32.0*  MCV 79.5*  --   --  76.2*  --   --  78.6*  PLT 369  --   --  185  --   --  176   < > = values in this interval not displayed.    Cardiac Enzymes: No results for input(s): "CKTOTAL", "CKMB", "CKMBINDEX", "TROPONINI" in the last 168 hours.  BNP: BNP (last 3 results) Recent Labs    07/24/21 1433 10/11/21 1230 12/27/21 1451  BNP 3,184.1* 400.8* >4,500.0*    ProBNP (last 3 results) No results for input(s): "PROBNP" in the last 8760 hours.    Other results:  Imaging: DG CHEST PORT 1 VIEW  Result Date: 12/28/2021 CLINICAL DATA:  Respiratory failure. EXAM: PORTABLE CHEST 1 VIEW COMPARISON:  Earlier radiograph  dated 12/28/2021. FINDINGS: Interval placement of a right IJ Swan-Ganz catheter with tip in the right pulmonary artery. Additional support apparatus in similar position. Cardiomegaly with mild central vascular congestion. No focal consolidation, pleural effusion, pneumothorax. Median sternotomy wires and postsurgical changes of cardiac valve. No acute osseous pathology. IMPRESSION: Interval placement of a right IJ Swan-Ganz catheter. No pneumothorax. Electronically Signed   By: [name] M.D.   On: 12/28/2021 20:58   ECHOCARDIOGRAM COMPLETE  Result Date: 12/28/2021    ECHOCARDIOGRAM REPORT   Patient Name:   Donald [name] Date of Exam: 12/28/2021 Medical Rec #:  [id number]           Height:       70.0 in Accession #:    [id number]          Weight:       205.9 lb Date of Birth:  1985-08-09          BSA:          2.113 m Patient Age:    35 years            BP:           126/83 mmHg Patient Gender: M                   HR:           90 bpm. Exam Location:  Inpatient Procedure: 2D Echo STAT ECHO Indications:    mitral valve disorder. heart failure.  History:        Patient has prior history of Echocardiogram examinations, most                 recent 10/11/2021. Signs/Symptoms:elevated troponin and Shortness                 of Breath.                  Mitral Valve: 31 mm St. Jude mechanical valve valve is present                 in the mitral position. Procedure Date: 06/10/2021.  Sonographer:  [name] RDCS Referring Phys: [id number] [name] IMPRESSIONS  1. Left ventricular ejection fraction, by estimation, is <20%. The left ventricle has severely decreased function. The left ventricle demonstrates global hypokinesis. The left ventricular internal cavity size was severely dilated. Left ventricular diastolic function could not be evaluated.  2. Right ventricular systolic function is moderately reduced. The right ventricular size is moderately enlarged. There is moderately elevated pulmonary  artery systolic pressure.  3. Right atrial size was moderately dilated.  4. Technically challenging images. One of the mitral leaflets can be seen moving, but the other is obscured. Gradient is expected but with very low stroke volume, this may be a falsely low gradient. The mitral valve has been repaired/replaced. Trivial mitral valve regurgitation. The mean mitral valve gradient is 5.5 mmHg. There is a 31 mm St. Jude mechanical valve present in the mitral position. Procedure Date: 06/10/2021.  5. Tricuspid valve regurgitation is moderate.  6. The aortic valve is tricuspid. Aortic valve regurgitation is trivial.  7. Pulmonic valve regurgitation is moderate.  8. The inferior vena cava is dilated in size with <50% respiratory variability, suggesting right atrial pressure of 15 mmHg. Conclusion(s)/Recommendation(s): Biventricular failure. 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 Can see at least one leaflet moving but not see the other. Gradient low but this may be due to poor CO. Recommend TEE, fluoro, and/or CT for further evaluation of the mitral valve. FINDINGS  Left Ventricle: Left ventricular ejection fraction, by estimation, is <20%. The left ventricle has severely decreased function. The left ventricle demonstrates global hypokinesis. The left ventricular internal cavity size was severely dilated. There is no left ventricular hypertrophy. Left ventricular diastolic function could not be evaluated. Right Ventricle: The right ventricular size is moderately enlarged. Right vetricular wall thickness was not well visualized. Right ventricular systolic function is moderately reduced. There is moderately elevated pulmonary artery systolic pressure. The tricuspid regurgitant velocity is 2.85 m/s, and with an assumed right atrial pressure of 15 mmHg, the estimated right ventricular systolic pressure is 47.5 mmHg. Left Atrium: Left atrial size was not well visualized. Right Atrium: Right atrial size was moderately  dilated. Pericardium: There is no evidence of pericardial effusion. Mitral Valve: Technically challenging images. One of the mitral leaflets can be seen moving, but the other is obscured. Gradient is expected but with very low stroke volume, this may be a falsely low gradient. The mitral valve has been repaired/replaced.  Trivial mitral valve regurgitation. There is a 31 mm St. Jude mechanical valve present in the mitral position. Procedure Date: 06/10/2021. MV peak gradient, 11.2 mmHg. The mean mitral valve gradient is 5.5 mmHg. Tricuspid Valve: The tricuspid valve is grossly normal. Tricuspid valve regurgitation is moderate . No evidence of tricuspid stenosis. Aortic Valve: The aortic valve is tricuspid. Aortic valve regurgitation is trivial. Pulmonic Valve: The pulmonic valve was grossly normal. Pulmonic valve regurgitation is moderate. No evidence of pulmonic stenosis. Aorta: The aortic root, ascending aorta, aortic arch and descending aorta are all structurally normal, with no evidence of dilitation or obstruction. Venous: The inferior vena cava is dilated in size with less than 50% respiratory variability, suggesting right atrial pressure of 15 mmHg. IAS/Shunts: The interatrial septum was not well visualized.  LEFT VENTRICLE PLAX 2D LVIDd:         6.00 cm LVIDs:         5.90 cm LV PW:         0.90 cm LV IVS:        1.10  cm LVOT diam:     2.60 cm LV SV:         30 LV SV Index:   14 LVOT Area:     5.31 cm  RIGHT VENTRICLE            IVC RV S prime:     8.59 cm/s  IVC diam: 2.30 cm TAPSE (M-mode): 1.3 cm LEFT ATRIUM         Index       RIGHT ATRIUM           Index LA diam:    4.00 cm 1.89 cm/m  RA Area:     22.40 cm                                 RA Volume:   72.20 ml  34.16 ml/m  AORTIC VALVE LVOT Vmax:   53.60 cm/s LVOT Vmean:  33.000 cm/s LVOT VTI:    0.057 m  AORTA Ao Asc diam: 3.40 cm MITRAL VALVE                TRICUSPID VALVE MV Area (PHT): 6.37 cm     TR Peak grad:   32.5 mmHg MV Area VTI:   1.17  cm     TR Vmax:        285.00 cm/s MV Peak grad:  11.2 mmHg MV Mean grad:  5.5 mmHg     SHUNTS MV Vmax:       1.67 m/s     Systemic VTI:  0.06 m MV Vmean:      106.6 cm/s   Systemic Diam: 2.60 cm MV Decel Time: 119 msec MV E velocity: 143.00 cm/s MV A velocity: 119.00 cm/s MV E/A ratio:  1.20 Jodelle Red MD Electronically signed by Jodelle Red MD Signature Date/Time: 12/28/2021/1:37:16 PM    Final    DG Chest Port 1 View  Result Date: 12/28/2021 CLINICAL DATA:  Respiratory failure and status post intubation, orogastric tube placement and central line placement. EXAM: PORTABLE CHEST 1 VIEW COMPARISON:  Prior study at 0600 hours FINDINGS: Stable cardiac enlargement. Interval intubation with endotracheal tube tip approximately 4 cm above the carina. Placement of left subclavian central line with catheter tip in the upper SVC. Gastric decompression tube extends below the diaphragm. There is no evidence of pulmonary edema, consolidation, pneumothorax or pleural fluid. IMPRESSION: Appropriate positioning endotracheal tube, central line and orogastric tube. No pneumothorax or other acute findings. Electronically Signed   By: Irish Lack M.D.   On: 12/28/2021 12:11   DG Abd Portable 1V  Result Date: 12/28/2021 CLINICAL DATA:  Enteric tube placement EXAM: PORTABLE ABDOMEN - 1 VIEW COMPARISON:  None Available. FINDINGS: Tip of enteric tube is seen in the region of body of stomach. Heart is enlarged in size. Bowel gas pattern in the upper abdomen is unremarkable. IMPRESSION: Tip of enteric tube is seen in the stomach. Electronically Signed   By: Ernie Avena M.D.   On: 12/28/2021 12:00   [place] EKG SITE RITE  Result Date: 12/28/2021 If Site Rite image not attached, placement could not be confirmed due to current cardiac rhythm.  [place] EKG SITE RITE  Result Date: 12/28/2021 If Site Rite image not attached, placement could not be confirmed due to current cardiac rhythm.    Medications:      Scheduled Medications:  Chlorhexidine Gluconate Cloth  6 each Topical Daily   feeding  supplement (PROSource TF)  45 mL Per Tube BID   feeding supplement (VITAL HIGH PROTEIN)  1,000 mL Per Tube Q24H   mouth rinse  15 mL Mouth Rinse Q2H   pantoprazole sodium  40 mg Per Tube Daily   polyethylene glycol  17 g Per Tube Daily    Infusions:  sodium chloride 10 mL/hr at 12/30/21 0600   sodium chloride Stopped (12/30/21 0442)   sodium chloride 10 mL/hr at 12/29/21 2041   ceFEPime (MAXIPIME) IV Stopped (12/29/21 2239)   heparin 1,750 Units/hr (12/30/21 0600)   milrinone 0.125 mcg/kg/min (12/30/21 0600)   norepinephrine (LEVOPHED) Adult infusion Stopped (12/30/21 0931)   propofol (DIPRIVAN) infusion 25 mcg/kg/min (12/30/21 0635)   sodium phosphate 15 mmol in dextrose 5 % 250 mL infusion 43 mL/hr at 12/30/21 0600   vancomycin Stopped (12/29/21 1651)    PRN Medications: sodium chloride, docusate, fentaNYL (SUBLIMAZE) injection, ipratropium-albuterol, mouth rinse   Assessment/Plan:   1. Mixed cardiogenic/septic shock - echo 12/28/21 LVEF 10% RV sev HK - TEE 7/15 severe biventricular failure. + vegetation on mechanical MVR - Bcx pending, respiratory culture w/ moderate WBCs (mononuclear and PMN) and no organisms -> on vanc/cefepime - Co-ox 61% on milrinone 0.125. NE weaned off overnight.   2. 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 Repeat echo as above.  - PTA Spironolactone, Farxiga, Digoxin and Losartan currently held in the setting of shock and his AKI.  - Echo this admit LVEF 10% RV severely HK.  - Long-term only option is transplant but not sure he will be candidate - CVP 12. Not currently on any diuretics. Over 3L UOP charted yesterday. Will eventually need diuretics.   3. 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  - Underwent MVR with [facility] mitral valve [facility].  - TEE 7/15 severe biventricular failure. + vegetation on mechanical MVR - Bcx pending. -> on vanc/cefepime - Consult ID - Continue heparin   4. Acute hypoxic respiratory failure - intubated 7/15 in setting of shock - CCM managing  5. Paroxysmal Atrial Fibrillation - Maintaining NSR this admission - off amio with liver injury - does not tolerate AF  5. AKI - Due to ATN/shock - Baseline creatinine 0.98 - 1.0.  - Peak 2.6. Improving with support now 1.33  6. Shock liver - continue supportive care  7. Autoimmune Disorder  - He has seronegative spondylitis and pyoderma gangrenosum. Followed by Rheumatology as an outpatient.   8. Hypomag - repleted    Length of Stay: 2  FINCH, LINDSAY N PA-C 12/30/2021, 7:05 AM  Advanced Heart Failure Team Pager [phone] (M-F; 7a - 4p)  Please contact [facility] for night-coverage after hours (4p -7a ) and weekends on [email or web address]  Agree with above.  Remains intubated. Spiked high fevers overnight requiring [employer]. NE off. Milrinone at 0.125  Swan numbers marginal. Scr improved. LFTs high   General:  Intubated. Will arouse and follow commands HEENT: normal + ETT Neck: supple. RIJ swan . Carotids 2+ bilat; no bruits. No lymphadenopathy or thryomegaly appreciated. Cor: PMI nondisplaced. Regular rate & rhythm. Mechanical s1 Lungs: clear Abdomen: soft, nontender, nondistended. No hepatosplenomegaly. No bruits or masses. Good bowel sounds. Extremities: no cyanosis, clubbing, rash,  edema Neuro: On vent. Will arouse and follow commands. Non-focal  Remains critically ill with primarily septic shock on top of very poor cardiac reserve. Will consult ID for prosthetic MV endocarditis.   D/w CCM not ready for extubation today.    Continue milrinone. Restart NE as need for both BP and cardiac output support in setting of MSOF.   CRITICAL CARE Performed by: Arvilla Meres  Total critical care time: 35 minutes  Critical care time was exclusive of separately billable procedures and treating other patients.  Critical care was necessary to treat or prevent imminent or life-threatening deterioration.  Critical care was time spent personally by me (independent of midlevel providers or residents) on the following activities: development of treatment plan with patient and/or surrogate as well as nursing, discussions with consultants, evaluation of patient's response to treatment, examination of patient, obtaining history from patient or surrogate, ordering and performing treatments and interventions, ordering and review of laboratory studies, ordering and review of radiographic studies, pulse oximetry and re-evaluation of patient's condition.    Arvilla Meres, MD  9:30 AM

## 2021-12-30 NOTE — Progress Notes (Signed)
ANTICOAGULATION CONSULT NOTE  Pharmacy Consult for heparin (coumadin on hold) Indication:  MVR /AFib  No Known Allergies  Patient Measurements: Height: 5\' 10"  (177.8 cm) Weight: 89.2 kg (196 lb 10.4 oz) IBW/kg (Calculated) : 73 Heparin Dosing Weight: 90.7 kg  Vital Signs: Temp: 100.6 F (38.1 C) (07/17 0700) Temp Source: Core (07/17 0000) BP: 121/82 (07/16 2013) Pulse Rate: 90 (07/17 0700)  Labs: Recent Labs    12/27/21 1451 12/27/21 1755 12/28/21 0748 12/28/21 0949 12/28/21 1034 12/28/21 1552 12/28/21 1640 12/28/21 1944 12/28/21 2040 12/29/21 0323 12/29/21 0403 12/29/21 0958 12/29/21 1355 12/29/21 2042 12/29/21 2219 12/30/21 0150  HGB 10.6*  --   --    < >  --    < >  --    < >  --  10.6* 11.2*  --   --  11.6*  --  10.5*  HCT 32.2*  --   --    < >  --    < >  --    < >  --  31.1* 33.0*  --   --  34.0*  --  32.0*  PLT 369  --   --   --   --   --   --   --   --  185  --   --   --   --   --  176  LABPROT 16.8*  --   --   --  24.5*  --   --   --   --  33.8*  --   --   --   --   --  38.3*  INR 1.4*  --   --   --  2.2*  --   --   --   --  3.4*  --   --   --   --   --  4.0*  HEPARINUNFRC  --   --   --   --   --   --   --    < >  --  0.15*  --  0.16* 0.14*  --  0.17*  --   CREATININE 1.40*  --  2.15*  --  2.54*  --  2.66*   < > 2.59* 2.39*  --   --   --   --   --  1.33*  TROPONINIHS 257* 210* 350*  --   --   --  978*  --   --   --   --   --   --   --   --   --    < > = values in this interval not displayed.     Estimated Creatinine Clearance: 88.8 mL/min (A) (by C-G formula based on SCr of 1.33 mg/dL (H)).   Medical History: Past Medical History:  Diagnosis Date   Anemia    Autoimmune disorder (HCC)    pyoderma gangrenosum   CHF (congestive heart failure) (HCC)    Chronic systolic heart failure (HCC)    a. EF 35-40% by echo in 07/2018 b. EF at 45% by repeat echo in 04/2020   DVT (deep venous thrombosis) (HCC)    h/o   Dysrhythmia    Mitral regurgitation     a. 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 b. 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   Mitral stenosis    Myocardial infarction (HCC)    Paroxysmal atrial flutter (HCC)  Pyoderma gangrenosa    Seronegative spondylitis (HCC)    arthritis   Tricuspid regurgitation    Medications:   Scheduled:   Infusions:   sodium chloride 10 mL/hr at 12/30/21 0600   sodium chloride Stopped (12/30/21 0442)   sodium chloride 10 mL/hr at 12/29/21 2041   ceFEPime (MAXIPIME) IV Stopped (12/29/21 2239)   heparin 1,750 Units/hr (12/30/21 0600)   milrinone 0.125 mcg/kg/min (12/30/21 0600)   norepinephrine (LEVOPHED) Adult infusion Stopped (12/30/21 3419)   propofol (DIPRIVAN) infusion 25 mcg/kg/min (12/30/21 0635)   sodium phosphate 15 mmol in dextrose 5 % 250 mL infusion 43 mL/hr at 12/30/21 0600   vancomycin Stopped (12/29/21 1651)   Assessment: 35 yom with a history of mitral regurgitation s/p valve replacement on Coumadin, HF, AF and prior DVT, and Hx of hidradenitis and pyoderma gangrenosum . Patient presenting with weakness.  Patient taking warfarin pta. Home dose is 4mg  daily per last [facility] note. INR 1.4 on 7/10 instructed to take 6mg  that night and then continue 4mg  daily. Last taken 7/14.  Heparin level low this morning at 0.20 despite rate increase overnight; INR continues to be elevated at 4, but likely reflective of hepatic dysfunction, not true anticoagulation.  No overt bleeding or complications noted.   Goal of Therapy:  Heparin level: 0.3-0.7 units/mL Monitor platelets by anticoagulation protocol: Yes   Plan:  Increase Heparin to 1950 units/hr 6-8 hour heparin level Warfarin on hold, will follow up need for vitamin k but hesitant with mechanical valve Daily heparin level, CBC and INR.  PharmD., BCPS Clinical Pharmacist 12/30/2021 7:08 AM

## 2021-12-30 NOTE — TOC CM/SW Note (Signed)
.   Transition of Care [facility]) Screening Note   Patient Details  Name: EILAN MCINERNY Date of Birth: Jun 02, 1986   Transition of Care [facility]) CM/SW Contact:    Elliot Cousin, RN Phone Number: [phone] 12/30/2021, 2:00 PM    Transition of Care Department [facility]) has reviewed patient. We will continue to monitor patient advancement through interdisciplinary progression rounds. Will continue to monitor for dc needs.   Isidoro Donning RN3 CCM, Heart Failure TOC CM [phone]

## 2021-12-31 DIAGNOSIS — I059 Rheumatic mitral valve disease, unspecified: Secondary | ICD-10-CM | POA: Diagnosis not present

## 2021-12-31 DIAGNOSIS — A419 Sepsis, unspecified organism: Secondary | ICD-10-CM | POA: Diagnosis not present

## 2021-12-31 DIAGNOSIS — I5082 Biventricular heart failure: Secondary | ICD-10-CM | POA: Diagnosis not present

## 2021-12-31 DIAGNOSIS — I429 Cardiomyopathy, unspecified: Secondary | ICD-10-CM | POA: Diagnosis not present

## 2021-12-31 DIAGNOSIS — J9601 Acute respiratory failure with hypoxia: Secondary | ICD-10-CM | POA: Diagnosis not present

## 2021-12-31 DIAGNOSIS — I5023 Acute on chronic systolic (congestive) heart failure: Secondary | ICD-10-CM

## 2021-12-31 DIAGNOSIS — R6521 Severe sepsis with septic shock: Secondary | ICD-10-CM | POA: Diagnosis not present

## 2021-12-31 DIAGNOSIS — R570 Cardiogenic shock: Secondary | ICD-10-CM | POA: Diagnosis not present

## 2021-12-31 DIAGNOSIS — K529 Noninfective gastroenteritis and colitis, unspecified: Secondary | ICD-10-CM | POA: Diagnosis not present

## 2021-12-31 DIAGNOSIS — R7401 Elevation of levels of liver transaminase levels: Secondary | ICD-10-CM | POA: Diagnosis not present

## 2021-12-31 DIAGNOSIS — Z9911 Dependence on respirator [ventilator] status: Secondary | ICD-10-CM | POA: Diagnosis not present

## 2021-12-31 LAB — COMPREHENSIVE METABOLIC PANEL
ALT: 2229 U/L — ABNORMAL HIGH (ref 0–44)
AST: 4291 U/L — ABNORMAL HIGH (ref 15–41)
Albumin: 1.9 g/dL — ABNORMAL LOW (ref 3.5–5.0)
Alkaline Phosphatase: 141 U/L — ABNORMAL HIGH (ref 38–126)
Anion gap: 11 (ref 5–15)
BUN: 22 mg/dL — ABNORMAL HIGH (ref 6–20)
CO2: 22 mmol/L (ref 22–32)
Calcium: 7.7 mg/dL — ABNORMAL LOW (ref 8.9–10.3)
Chloride: 104 mmol/L (ref 98–111)
Creatinine, Ser: 0.7 mg/dL (ref 0.61–1.24)
GFR, Estimated: >60 mL/min (ref >60)
Glucose, Bld: 112 mg/dL — ABNORMAL HIGH (ref 70–99)
Potassium: 3.3 mmol/L — ABNORMAL LOW (ref 3.5–5.1)
Sodium: 137 mmol/L (ref 135–145)
Total Bilirubin: 1.8 mg/dL — ABNORMAL HIGH (ref 0.3–1.2)
Total Protein: 7.3 g/dL (ref 6.5–8.1)

## 2021-12-31 LAB — CBC
HCT: 32.6 % — ABNORMAL LOW (ref 39.0–52.0)
Hemoglobin: 10.7 g/dL — ABNORMAL LOW (ref 13.0–17.0)
MCH: 26.1 pg (ref 26.0–34.0)
MCHC: 32.8 g/dL (ref 30.0–36.0)
MCV: 79.5 fL — ABNORMAL LOW (ref 80.0–100.0)
Platelets: 154 10*3/uL (ref 150–400)
RBC: 4.1 MIL/uL — ABNORMAL LOW (ref 4.22–5.81)
RDW: 15.6 % — ABNORMAL HIGH (ref 11.5–15.5)
WBC: 13.3 10*3/uL — ABNORMAL HIGH (ref 4.0–10.5)
nRBC: 0.5 % — ABNORMAL HIGH (ref 0.0–0.2)

## 2021-12-31 LAB — VANCOMYCIN, TROUGH: Vancomycin Tr: 14 ug/mL — ABNORMAL LOW (ref 15–20)

## 2021-12-31 LAB — GLUCOSE, CAPILLARY
Glucose-Capillary: 111 mg/dL — ABNORMAL HIGH (ref 70–99)
Glucose-Capillary: 117 mg/dL — ABNORMAL HIGH (ref 70–99)
Glucose-Capillary: 120 mg/dL — ABNORMAL HIGH (ref 70–99)
Glucose-Capillary: 127 mg/dL — ABNORMAL HIGH (ref 70–99)
Glucose-Capillary: 132 mg/dL — ABNORMAL HIGH (ref 70–99)
Glucose-Capillary: 136 mg/dL — ABNORMAL HIGH (ref 70–99)
Glucose-Capillary: 140 mg/dL — ABNORMAL HIGH (ref 70–99)

## 2021-12-31 LAB — COOXEMETRY PANEL
Carboxyhemoglobin: 1.1 % (ref 0.5–1.5)
Methemoglobin: 1.2 % (ref 0.0–1.5)
O2 Saturation: 55.2 %
Total hemoglobin: 11.8 g/dL — ABNORMAL LOW (ref 12.0–16.0)

## 2021-12-31 LAB — PHOSPHORUS: Phosphorus: 1.7 mg/dL — ABNORMAL LOW (ref 2.5–4.6)

## 2021-12-31 LAB — HEPARIN LEVEL (UNFRACTIONATED)
Heparin Unfractionated: 0.18 IU/mL — ABNORMAL LOW (ref 0.30–0.70)
Heparin Unfractionated: 0.13 IU/mL — ABNORMAL LOW (ref 0.30–0.70)
Heparin Unfractionated: 0.21 IU/mL — ABNORMAL LOW (ref 0.30–0.70)

## 2021-12-31 LAB — CULTURE, RESPIRATORY W GRAM STAIN: Culture: NO GROWTH

## 2021-12-31 LAB — PROTIME-INR
INR: 4 — ABNORMAL HIGH (ref 0.8–1.2)
Prothrombin Time: 38.3 seconds — ABNORMAL HIGH (ref 11.4–15.2)

## 2021-12-31 LAB — VANCOMYCIN, PEAK: Vancomycin Pk: 29 ug/mL — ABNORMAL LOW (ref 30–40)

## 2021-12-31 MED ORDER — SENNOSIDES 8.8 MG/5ML PO SYRP: 10.0000 mL | ORAL_SOLUTION | Freq: Two times a day (BID) | ORAL | Status: DC

## 2021-12-31 MED ORDER — POTASSIUM PHOSPHATES 15 MMOLE/5ML IV SOLN
15.0000 mmol | Freq: Once | INTRAVENOUS | Status: AC
  Administered 2021-12-31: 15 mmol via INTRAVENOUS
  Filled 2021-12-31: qty 5

## 2021-12-31 MED ORDER — ACETAMINOPHEN 160 MG/5ML PO SOLN
650.0000 mg | Freq: Four times a day (QID) | ORAL | Status: DC | PRN
Start: 2021-12-31 — End: 2022-01-06
  Administered 2021-12-31 – 2022-01-04 (×3): 650 mg
  Filled 2021-12-31 (×3): qty 20.3

## 2021-12-31 MED ORDER — SENNOSIDES 8.8 MG/5ML PO SYRP
5.0000 mL | ORAL_SOLUTION | Freq: Two times a day (BID) | ORAL | Status: DC
  Administered 2021-12-31 (×2): 5 mL
  Filled 2021-12-31 (×2): qty 5

## 2021-12-31 MED ORDER — POTASSIUM & SODIUM PHOSPHATES 280-160-250 MG PO PACK
2.0000 | PACK | Freq: Once | ORAL | Status: AC
Start: 2021-12-31 — End: 2021-12-31
  Administered 2021-12-31: 2
  Filled 2021-12-31: qty 2

## 2021-12-31 MED ORDER — IBUPROFEN 100 MG/5ML PO SUSP
400.0000 mg | Freq: Four times a day (QID) | ORAL | Status: DC | PRN
  Administered 2021-12-31: 400 mg
  Filled 2021-12-31: qty 20

## 2021-12-31 MED ORDER — IBUPROFEN 100 MG/5ML PO SUSP
400.0000 mg | Freq: Three times a day (TID) | ORAL | Status: DC | PRN
  Administered 2021-12-31: 400 mg
  Filled 2021-12-31 (×2): qty 20

## 2021-12-31 MED ORDER — IBUPROFEN 200 MG PO TABS: 400.0000 mg | ORAL_TABLET | Freq: Four times a day (QID) | ORAL | Status: DC | PRN

## 2021-12-31 NOTE — Progress Notes (Signed)
eLink Physician-Brief Progress Note Patient Name: KALID GHAN DOB: [birth year] MRN: [id number]   Date of Service  12/31/2021  HPI/Events of Note  Patient is shivering uncontrollably on the arctic sun currently, most recent temperature is 35.9 degrees centigrade.  eICU Interventions  Will discontinue arctic sun for now and order PRN Ibuprofen since his renal function has normalized, will re-evaluate id hyper-pyrexia recurs.        Thomasene Lot Chemere Steffler 12/31/2021, 12:27 AM

## 2021-12-31 NOTE — Progress Notes (Signed)
ANTICOAGULATION CONSULT NOTE  Pharmacy Consult for heparin (warfarin on hold) Indication:  MVR /AFib  No Known Allergies  Patient Measurements: Height: 5\' 10"  (177.8 cm) Weight: 91.5 kg (201 lb 11.5 oz) IBW/kg (Calculated) : 73 Heparin Dosing Weight: 90.7 kg  Vital Signs: Temp: 100.4 F (38 C) (07/18 1900) BP: 96/62 (07/18 1933) Pulse Rate: 76 (07/18 1933)  Labs: Recent Labs    12/29/21 0323 12/29/21 0403 12/29/21 2042 12/29/21 2219 12/30/21 0150 12/30/21 0805 12/30/21 1300 12/30/21 1707 12/31/21 0022 12/31/21 0323 12/31/21 1036 12/31/21 1839  HGB 10.6*   < > 11.6*  --  10.5*  --   --   --   --  10.7*  --   --   HCT 31.1*   < > 34.0*  --  32.0*  --   --   --   --  32.6*  --   --   PLT 185  --   --   --  176  --   --   --   --  154  --   --   LABPROT 33.8*  --   --   --  38.3*  --   --   --   --  38.3*  --   --   INR 3.4*  --   --   --  4.0*  --   --   --   --  4.0*  --   --   HEPARINUNFRC 0.15*   < >  --    < >  --    < >  --    < > 0.18*  --  0.13* 0.21*  CREATININE 2.39*  --   --   --  1.33*  --  0.97  --   --  0.70  --   --    < > = values in this interval not displayed.     Estimated Creatinine Clearance: 149.4 mL/min (by C-G formula based on SCr of 0.7 mg/dL).   Medical History: Past Medical History:  Diagnosis Date   Anemia    Autoimmune disorder (HCC)    pyoderma gangrenosum   CHF (congestive heart failure) (HCC)    Chronic systolic heart failure (HCC)    a. EF 35-40% by echo in 07/2018 b. EF at 45% by repeat echo in 04/2020   DVT (deep venous thrombosis) (HCC)    h/o   Dysrhythmia    Mitral regurgitation    a. 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 b. 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HCC)    arthritis    Tricuspid regurgitation    Medications:   Scheduled:   Infusions:   sodium chloride 10 mL/hr at 12/31/21 1800   sodium chloride 10 mL/hr at 12/31/21 1800   sodium chloride Stopped (12/30/21 2208)   ceFEPime (MAXIPIME) IV Stopped (12/31/21 1519)   feeding supplement (VITAL 1.5 CAL) 50 mL/hr at 12/31/21 1800   heparin 2,600 Units/hr (12/31/21 1800)   milrinone 0.125 mcg/kg/min (12/31/21 1800)   norepinephrine (LEVOPHED) Adult infusion 4 mcg/min (12/31/21 1800)   propofol (DIPRIVAN) infusion 25 mcg/kg/min (12/31/21 1800)   vancomycin Stopped (12/31/21 1755)   Assessment: 35 yom with a history of mitral regurgitation s/p valve replacement on Coumadin, HF, AF  and prior DVT, and Hx of hidradenitis and pyoderma gangrenosum . Patient presenting with weakness.  Patient taking warfarin pta. Home dose is 4mg  daily per last [facility] note. INR 1.4 on 7/10 instructed to take 6mg  that night and then continue 4mg  daily. Last taken 7/14.  INR continues to be elevated at 4, but likely reflective of hepatic dysfunction with AST4000/ALT2000, not true anticoagulation.    Heparin level remains subtherapeutic at 0.21 on 2600 units/hr. No bleeding noted. No problems with infusion or bleeding per RN.  Goal of Therapy:  Heparin level: 0.3-0.7 units/mL (targeting low end of goal range) Monitor platelets by anticoagulation protocol: Yes   Plan:  Increase heparin rate to 2750 units/hr Heparin level 6-8 hours after rate change  Continue to hold warfarin Daily heparin level and CBC  Monitor for s/sx of bleeding  Thank you for involving pharmacy in this patient's care.  , PharmD, BCPS Clinical Pharmacist Clinical phone for 12/31/2021 until 10p is [phone] 12/31/2021 7:43 PM

## 2021-12-31 NOTE — Progress Notes (Signed)
ANTICOAGULATION CONSULT NOTE  Pharmacy Consult for heparin (warfarin on hold) Indication:  MVR /AFib  No Known Allergies  Patient Measurements: Height: 5\' 10"  (177.8 cm) Weight: 91.5 kg (201 lb 11.5 oz) IBW/kg (Calculated) : 73 Heparin Dosing Weight: 90.7 kg  Vital Signs: Temp: 98.4 F (36.9 C) (07/18 1400) BP: 105/72 (07/18 1055) Pulse Rate: 77 (07/18 1400)  Labs: Recent Labs    12/28/21 1640 12/28/21 1944 12/29/21 0323 12/29/21 0403 12/29/21 2042 12/29/21 2219 12/30/21 0150 12/30/21 0805 12/30/21 1300 12/30/21 1707 12/31/21 0022 12/31/21 0323 12/31/21 1036  HGB  --    < > 10.6*   < > 11.6*  --  10.5*  --   --   --   --  10.7*  --   HCT  --    < > 31.1*   < > 34.0*  --  32.0*  --   --   --   --  32.6*  --   PLT  --   --  185  --   --   --  176  --   --   --   --  154  --   LABPROT  --   --  33.8*  --   --   --  38.3*  --   --   --   --  38.3*  --   INR  --   --  3.4*  --   --   --  4.0*  --   --   --   --  4.0*  --   HEPARINUNFRC  --   --  0.15*   < >  --    < >  --    < >  --  0.22* 0.18*  --  0.13*  CREATININE 2.66*   < > 2.39*  --   --   --  1.33*  --  0.97  --   --  0.70  --   TROPONINIHS 978*  --   --   --   --   --   --   --   --   --   --   --   --    < > = values in this interval not displayed.     Estimated Creatinine Clearance: 149.4 mL/min (by C-G formula based on SCr of 0.7 mg/dL).   Medical History: Past Medical History:  Diagnosis Date   Anemia    Autoimmune disorder (HCC)    pyoderma gangrenosum   CHF (congestive heart failure) (HCC)    Chronic systolic heart failure (HCC)    a. EF 35-40% by echo in 07/2018 b. EF at 45% by repeat echo in 04/2020   DVT (deep venous thrombosis) (HCC)    h/o   Dysrhythmia    Mitral regurgitation    a. 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 b. 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   Mitral stenosis    Myocardial  infarction (HCC)    Paroxysmal atrial flutter (HCC)    Pyoderma gangrenosa    Seronegative spondylitis (HCC)    arthritis   Tricuspid regurgitation    Medications:   Scheduled:   Infusions:   sodium chloride 10 mL/hr at 12/31/21 1300   sodium chloride 10 mL/hr at 12/31/21 1300   sodium chloride Stopped (12/30/21 2208)   ceFEPime (MAXIPIME) IV 2 g (12/31/21 1449)   feeding supplement (VITAL 1.5  CAL)     heparin 2,600 Units/hr (12/31/21 1300)   milrinone 0.125 mcg/kg/min (12/31/21 1300)   norepinephrine (LEVOPHED) Adult infusion 4 mcg/min (12/31/21 1300)   propofol (DIPRIVAN) infusion 20 mcg/kg/min (12/31/21 1300)   vancomycin 150 mL/hr at 12/31/21 1300   Assessment: 35 yom with a history of mitral regurgitation s/p valve replacement on Coumadin, HF, AF and prior DVT, and Hx of hidradenitis and pyoderma gangrenosum . Patient presenting with weakness.  Patient taking warfarin pta. Home dose is 4mg  daily per last [facility] note. INR 1.4 on 7/10 instructed to take 6mg  that night and then continue 4mg  daily. Last taken 7/14.  INR continues to be elevated at 4, but likely reflective of hepatic dysfunction with AST4000/ALT2000, not true anticoagulation.    Heparin level remains subtherapeutic and decreased to 0.13  despite heparin rate increase 2400 uts/hr.  Patient had slight infiltrate of iv site with heparin> changed sites    No bleeding noted h/h stable  SCr has come down significantly.  Goal of Therapy:  Heparin level: 0.3-0.7 units/mL (targeting low end of goal range) Monitor platelets by anticoagulation protocol: Yes   Plan:  Increase heparin rate to 2600 units/hr Heparin level 6 hours after rate change  Continue to hold Warfarin, Daily heparin level and CBC     Pharm.D. CPP, BCPS Clinical Pharmacist [phone] 12/31/2021 4:08 PM    **Pharmacist phone directory can be found on [email or web address] listed under [facility]**

## 2021-12-31 NOTE — Progress Notes (Signed)
NAME:  KOLESON REIFSTECK, MRN:  [id number], DOB:  [birth year], LOS: 3 ADMISSION DATE:  12/27/2021, CONSULTATION DATE:  12/28/21 REFERRING MD:  Dr. 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    Vitals and O2 saturations stable on admit with labs significant for subtherapeutic INR 1.4, Na 127, sCr 1.4, bicarb 18, AST 110, Hgb 10.6, trop hs 257> 210, LA 2.3> 1.7, CXR neg, EKG ST with prolonged Qtc.  CT A/P normal.  Given concern for sepsis, cultures sent, started empirically on vanc/ cefepime/ flagyl and given 1L LR bolus.  He was admitted to FPTS.  Overnight and this morning, he developed worsening SOB and tachypnea with saturations stable, BP stable, and given phenergan for vomiting.  Repeat EKG this am showed new STD in lateral leads.  Patient difficult stick, repeat labs pending.  Extremities now cold.  Stat TTE pending.  Cards/ HF consulted.  PCCM consulted for concern of worsening shock despite ok BP and ICU transfer.    Pertinent  Medical History  Biventricular HF, EF 20-25%- followed by Dr. Gala Romney Afib MR s/p MV repair w/ resection of ruptured anterior papillary muscle and reconstruction of papillary chord and placement of annuloplasty ring 2019, but do to recurrent MR, underwent MVR w/ mechanical valve at [facility] 05/2021 Anemia Pyoderma gangrenosum  Significant Hospital Events: Including procedures, antibiotic start and stop dates in addition to other pertinent events   7/14 admitted FPTS for sepsis, N/V. Started on flagyl, cefepime, vanc 7/15 worsening shock> ICU> intubated, pressors, abx. Flagyl stopped. 7/16 decreasing milrinone for high CO and coox   Interim History / Subjective:  Mr. Ashmore denies complaints.    Objective   Blood pressure 108/76, pulse 74, temperature 98.4 F (36.9 C), resp. rate 18, height 5\' 10"  (1.778 m), weight 91.5 kg, SpO2 99 %. PAP: (36-53)/(24-39) 36/24 CVP:  [4 mmHg-14 mmHg] 4 mmHg CO:  [3.6 L/min-5.5 L/min] 5.5 L/min CI:  [2.5 L/min/m2-2.6 L/min/m2] 2.6 L/min/m2  Vent Mode: PRVC FiO2 (%):  [30 %] 30 % Set Rate:  [15 bmp] 15 bmp Vt Set:  [580 mL] 580 mL PEEP:  [5 cmH20] 5 cmH20 Plateau Pressure:  [18 cmH20-20 cmH20] 19 cmH20   Intake/Output Summary (Last 24 hours) at 12/31/2021 0827 Last data filed at 12/31/2021 0700 Gross per 24 hour  Intake 3125.27 ml  Output 1450 ml  Net 1675.27 ml    Filed Weights   12/29/21 0242 12/30/21 0432 12/31/21 0500  Weight: 93.7 kg 89.2 kg 91.5 kg    Examination: General: Critically ill-appearing young man lying in bed no acute distress HEENT: [place]/AT, eyes anicteric.  Endotracheal tube in place. Neuro: RASS -4, comprehension appears intact.  Moving all extremities. CV: S1-S2, regular rate and rhythm. PULM: Tachypnea better controlled, breathing comfortably on pressure support 8 and CPAP 5.  Minimal endotracheal secretions. GI: Soft, nontender, nondistended Extremities: No significant peripheral edema, no cyanosis  Blood culture, 1 site 7/15: NGTD Blood culture,1 site 7/15: NGTD Blood cultures  7/16> NGTD Resp culture: NG  BUN 22 Cr 0.7 AST 4291 ALT 2229 Bili 1.8 WBC 13.3 H/H 10.7/32.6 INR 4.0 Coox 55%  Resolved Hospital Problem list   Hyponatremia Hypoglycemia  Assessment & Plan:   Acute respiratory  failure with hypoxia requiring MV -LTV V - VAP prevention protocol - PAD protocol for sedation - Daily SAT and SBT as appropriate.  Anticipate he will require Lasix prior to extubation.  Shock, multifactorial septic & cardiogenic MV endocarditis, mechanical valve> bacteremia that would have been present on admission may not have been captured with so few blood cultures being obtained -Continue nor epi to  maintain MAP greater than 65.  Continue milrinone 0.141mcg per cardiology. -Appreciate ID's management-continue broad antibiotics.  Continue following blood cultures until finalized. - Appreciate advanced heart failure team's management. -Tylenol used sparingly for fevers, BuSpar for intense shivering. 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  Prolonged Qtc- chronic -Monitor electrolytes and replete as needed.  Avoid QTc prolonging medications.  Hypophosphatemia - Repleted aggressively again today. - Continue to monitor  MR s/p MVR mechanical valve on coumadin Subtherapeutic INR on admit -Continue heparin, holding warfarin -Continue monitoring INR.  Lactic acidosis due to sepsis, improving AKI due to sepsis-improving -Strict I's/O - Continue to monitor - Renally dose medications and avoid nephrotoxic meds  Elevated liver enzymes due to sepsis, heart failure -Avoid hepatotoxic medications.  Continue holding amiodarone. - Sparingly use Tylenol for fevers.  Would prefer to avoid NSAIDs. - Continue monitoring INR and LFTs.  Paroxysmal AF, currently in sinus tach with fevers -Continue telemetry monitoring - Monitor electrolytes, replete as needed.  Mild hyperglycemia -Monitor - Can initiate SSI as needed if hyperglycemia worsens  N/V/D- probably due to sepsis from endocarditis vs heart failure -Monitor  At risk for malnutrition -Continue tube feeding support  Mother updated at bedside during rounds this morning.  Best Practice (right click and "Reselect all SmartList Selections" daily)   Diet/type: tubefeeds DVT prophylaxis: systemic heparin GI  prophylaxis: PPI Lines: Central line and Arterial Line Foley:  Yes, and it is still needed Code Status:  full code Last date of multidisciplinary goals of care discussion [per AHF 7/14]   Labs   CBC: Recent Labs  Lab 12/27/21 1451 12/28/21 0949 12/29/21 0323 12/29/21 0403 12/29/21 2042 12/30/21 0150 12/31/21 0323  WBC 6.2  --  12.3*  --   --  12.3* 13.3*  NEUTROABS 4.2  --   --   --   --   --   --   HGB 10.6*   < > 10.6* 11.2* 11.6* 10.5* 10.7*  HCT 32.2*   < > 31.1* 33.0* 34.0* 32.0* 32.6*  MCV 79.5*  --  76.2*  --   --  78.6* 79.5*  PLT 369  --  185  --   --  176 154   < > = values in this interval not displayed.     Basic Metabolic Panel: Recent Labs  Lab 12/28/21 1034 12/28/21 1552 12/28/21 2040 12/29/21 0323 12/29/21 0403 12/29/21 2042 12/30/21 0150 12/30/21 1300 12/31/21 0323  NA 131*   < > 131* 135 136 136 135 135 137  K 5.6*   < > 4.5 3.9 3.6 4.0 4.1 3.8 3.3*  CL 93*   < > 94* 97*  --   --  102 102 104  CO2 9*   < > 16* 19*  --   --  23 20* 22  GLUCOSE 159*   < > 84 107*  --   --  130* 114* 112*  BUN 28*   < > 37* 40*  --   --  31* 26* 22*  CREATININE 2.54*   < > 2.59* 2.39*  --   --  1.33* 0.97 0.70  CALCIUM 8.4*   < > 8.0* 8.1*  --   --  8.0* 7.7* 7.7*  MG 2.4  --   --  1.6*  --   --  2.6*  --   --   PHOS  --   --   --   --   --   --  1.7* 2.4* 1.7*   < > = values in this interval not displayed.    GFR: Estimated Creatinine Clearance: 149.4 mL/min (by C-G formula based on SCr of 0.7 mg/dL). Recent Labs  Lab 12/27/21 1451 12/27/21 1755 12/28/21 1034 12/28/21 1430 12/28/21 1632 12/28/21 2040 12/29/21 0323 12/29/21 0603 12/30/21 0150 12/31/21 0323  PROCALCITON  --   --  17.21  --   --   --  38.27  --  23.85  --   WBC 6.2  --   --   --   --   --  12.3*  --  12.3* 13.3*  LATICACIDVEN 2.3*   < > >9.0*   < > >9.0* 6.5*  --  2.2* 1.8  --    < > = values in this interval not displayed.     Liver Function Tests: Recent Labs  Lab  12/28/21 0748 12/28/21 2040 12/29/21 0323 12/30/21 0150 12/31/21 0323  AST 258* 2,267* 4,155* 4,576* 4,291*  ALT 73* 623* 1,238* 1,829* 2,229*  ALKPHOS 125 127* 132* 146* 141*  BILITOT 1.2 1.8* 1.8* 2.2* 1.8*  PROT 9.7* 8.2* 8.2* 8.2* 7.3  ALBUMIN 2.8* 2.5* 2.3* 2.2* 1.9*    No results for input(s): "LIPASE", "AMYLASE" in the last 168 hours. No results for input(s): "AMMONIA" in the last 168 hours.  ABG    Component Value Date/Time   PHART 7.414 12/29/2021 2042   PCO2ART 38.0 12/29/2021 2042   PO2ART 125 (H) 12/29/2021 2042   HCO3 24.0 12/29/2021 2042   TCO2 25 12/29/2021 2042   ACIDBASEDEF 11.0 (H) 12/28/2021 1552   O2SAT 55.2 12/31/2021 0323     Coagulation Profile: Recent Labs  Lab 12/27/21 1451 12/28/21 1034 12/29/21 0323 12/30/21 0150 12/31/21 0323  INR 1.4* 2.2* 3.4* 4.0* 4.0*     This patient is critically ill with multiple organ system failure which requires frequent high complexity decision making, assessment, support, evaluation, and titration of therapies. This was completed through the application of advanced monitoring technologies and extensive interpretation of multiple databases. During this encounter critical care time was devoted to patient care services described in this note for 38 minutes.  Steffanie Dunn, DO 12/31/21 3:59 PM [facility]

## 2021-12-31 NOTE — Progress Notes (Signed)
ANTICOAGULATION CONSULT NOTE  Pharmacy Consult for heparin (warfarin on hold) Indication:  MVR /AFib  No Known Allergies  Patient Measurements: Height: 5\' 10"  (177.8 cm) Weight: 89.2 kg (196 lb 10.4 oz) IBW/kg (Calculated) : 73 Heparin Dosing Weight: 90.7 kg  Vital Signs: Temp: 96.4 F (35.8 C) (07/18 0100) Temp Source: Core (07/17 2000) BP: 108/76 (07/17 1506) Pulse Rate: 71 (07/18 0100)  Labs: Recent Labs    12/28/21 0748 12/28/21 0949 12/28/21 1034 12/28/21 1552 12/28/21 1640 12/28/21 1944 12/29/21 0323 12/29/21 0403 12/29/21 0958 12/29/21 2042 12/29/21 2219 12/30/21 0150 12/30/21 0805 12/30/21 1300 12/30/21 1707 12/31/21 0022  HGB  --    < >  --    < >  --    < > 10.6* 11.2*  --  11.6*  --  10.5*  --   --   --   --   HCT  --    < >  --    < >  --    < > 31.1* 33.0*  --  34.0*  --  32.0*  --   --   --   --   PLT  --   --   --   --   --   --  185  --   --   --   --  176  --   --   --   --   LABPROT  --   --  24.5*  --   --   --  33.8*  --   --   --   --  38.3*  --   --   --   --   INR  --   --  2.2*  --   --   --  3.4*  --   --   --   --  4.0*  --   --   --   --   HEPARINUNFRC  --   --   --   --   --   --  0.15*  --    < >  --    < >  --  0.20*  --  0.22* 0.18*  CREATININE 2.15*  --  2.54*  --  2.66*   < > 2.39*  --   --   --   --  1.33*  --  0.97  --   --   TROPONINIHS 350*  --   --   --  978*  --   --   --   --   --   --   --   --   --   --   --    < > = values in this interval not displayed.     Estimated Creatinine Clearance: 121.8 mL/min (by C-G formula based on SCr of 0.97 mg/dL).   Medical History: Past Medical History:  Diagnosis Date   Anemia    Autoimmune disorder (HCC)    pyoderma gangrenosum   CHF (congestive heart failure) (HCC)    Chronic systolic heart failure (HCC)    a. EF 35-40% by echo in 07/2018 b. EF at 45% by repeat echo in 04/2020   DVT (deep venous thrombosis) (HCC)    h/o   Dysrhythmia    Mitral regurgitation    a. s/p MV  repair with resection of ruptured anterior papillary muscle and reconstruction of papillary chord and placement of annuloplasty ring in 2019. b. 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   Mitral stenosis  Myocardial infarction (HCC)    Paroxysmal atrial flutter (HCC)    Pyoderma gangrenosa    Seronegative spondylitis (HCC)    arthritis   Tricuspid regurgitation    Medications:   Scheduled:   Infusions:   sodium chloride 10 mL/hr at 12/31/21 0100   sodium chloride 10 mL/hr at 12/31/21 0100   sodium chloride Stopped (12/30/21 2208)   ceFEPime (MAXIPIME) IV Stopped (12/30/21 2227)   feeding supplement (VITAL 1.5 CAL)     heparin 2,100 Units/hr (12/31/21 0100)   milrinone 0.125 mcg/kg/min (12/31/21 0100)   norepinephrine (LEVOPHED) Adult infusion Stopped (12/30/21 0630)   propofol (DIPRIVAN) infusion 60 mcg/kg/min (12/31/21 0100)   vancomycin Stopped (12/30/21 1812)   Assessment: 35 yom with a history of mitral regurgitation s/p valve replacement on Coumadin, HF, AF and prior DVT, and Hx of hidradenitis and pyoderma gangrenosum . Patient presenting with weakness.  Patient taking warfarin pta. Home dose is 4mg  daily per last [facility] note. INR 1.4 on 7/10 instructed to take 6mg  that night and then continue 4mg  daily. Last taken 7/14.  INR continues to be elevated at 4, but likely reflective of hepatic dysfunction, not true anticoagulation.    Heparin level remains subtherapeutic and decreased to 0.18, despite rate increase. Pt did experience a nose bleed this morning but has since resolved. Will continue to get updates from nurse and assess rate as needed. No bleeding or infusion issues noted. SCr has come down significantly.  Goal of Therapy:  Heparin level: 0.3-0.7 units/mL (targeting low end of goal range) Monitor platelets by anticoagulation protocol: Yes   Plan:  Increase heparin rate to 2400 units/hr Heparin level 6 hours after rate change  Continue to hold Warfarin, will follow up need for  vitamin k but hesitant with mechanical valve Order a daily heparin level, CBC and INR.   Thank you for allowing to participate in this patients care. , PharmD 12/31/2021 2:17 AM  **Pharmacist phone directory can be found on [email or web address] listed under [facility]**

## 2021-12-31 NOTE — Progress Notes (Signed)
eLink Physician-Brief Progress Note Patient Name: Donald [name] DOB: [birth year] MRN: [id number]   Date of Service  12/31/2021  HPI/Events of Note  Patient with sub-optimal temperature control with PRN Tylenol and ice packs.  eICU Interventions  PRN Ibuprofen added.        Thomasene Lot Joon Pohle 12/31/2021, 9:10 PM

## 2021-12-31 NOTE — Progress Notes (Signed)
eLink Physician-Brief Progress Note Patient Name: Donald [name] DOB: [birth year] MRN: [id number]   Date of Service  12/31/2021  HPI/Events of Note  K+ 3.3, Phos 1.7  eICU Interventions  K+Phos 15 mmol iv x 1 ordered.        Thomasene Lot Param Capri 12/31/2021, 6:09 AM

## 2021-12-31 NOTE — Progress Notes (Addendum)
Advanced Heart Failure Rounding Note   Subjective:    Remains intubated/sedated.  On vanc/cefepime. NGTD from [facility] 07/15 and 07/16. Arctic sun removed opvernight. T 99.5 F this am.  Lactate has cleared. LFTS have peaked. Renal function back to baseline.  TEE + vegetation on MV  CO-OX 55% 3 am on milrinone 0.125  NE added back this am d/t low MAPs  Swan #s on milrinone 0.125 + NE 2 RA 8 PAP 33/21 (26) Therm CO/CI 5.17/2.49  NE increased to 4 while I was examining patient.      Objective:     Vital Signs:   Temp:  [96.4 F (35.8 C)-101.5 F (38.6 C)] 97.2 F (36.2 C) (07/18 0351) Pulse Rate:  [69-99] 69 (07/18 0351) Resp:  [16-33] 17 (07/18 0351) BP: (108)/(76) 108/76 (07/17 1506) SpO2:  [93 %-100 %] 99 % (07/18 0351) Arterial Line BP: (100-134)/(67-85) 105/76 (07/18 0100) FiO2 (%):  [30 %] 30 % (07/18 0351) Weight:  [91.5 kg] 91.5 kg (07/18 0500) Last BM Date : 12/27/21  Weight change: Filed Weights   12/29/21 0242 12/30/21 0432 12/31/21 0500  Weight: 93.7 kg 89.2 kg 91.5 kg    Intake/Output:   Intake/Output Summary (Last 24 hours) at 12/31/2021 0658 Last data filed at 12/31/2021 9833 Gross per 24 hour  Intake 2880.43 ml  Output 1450 ml  Net 1430.43 ml    Physical Exam: General:  Intubated and sedated HEENT: + ETT Neck: supple. JVP 7-8 cm. RIJ SWAN. Carotids 2+ bilat; no bruits.  Cor: PMI nondisplaced. Regular rate & rhythm. No rubs, gallops or murmurs. Mechanical S1. Lungs: clear Abdomen: soft, nontender, nondistended.  Extremities: no cyanosis, clubbing, rash, trace edema Neuro: sedated on vent    Telemetry: SR 70s (personally reviewed)   Labs: Basic Metabolic Panel: Recent Labs  Lab 12/28/21 1034 12/28/21 1552 12/28/21 2040 12/29/21 0323 12/29/21 0403 12/29/21 2042 12/30/21 0150 12/30/21 1300 12/31/21 0323  NA 131*   < > 131* 135 136 136 135 135 137  K 5.6*   < > 4.5 3.9 3.6 4.0 4.1 3.8 3.3*  CL 93*   < > 94* 97*  --   --   102 102 104  CO2 9*   < > 16* 19*  --   --  23 20* 22  GLUCOSE 159*   < > 84 107*  --   --  130* 114* 112*  BUN 28*   < > 37* 40*  --   --  31* 26* 22*  CREATININE 2.54*   < > 2.59* 2.39*  --   --  1.33* 0.97 0.70  CALCIUM 8.4*   < > 8.0* 8.1*  --   --  8.0* 7.7* 7.7*  MG 2.4  --   --  1.6*  --   --  2.6*  --   --   PHOS  --   --   --   --   --   --  1.7* 2.4* 1.7*   < > = values in this interval not displayed.    Liver Function Tests: Recent Labs  Lab 12/28/21 0748 12/28/21 2040 12/29/21 0323 12/30/21 0150 12/31/21 0323  AST 258* 2,267* 4,155* 4,576* 4,291*  ALT 73* 623* 1,238* 1,829* 2,229*  ALKPHOS 125 127* 132* 146* 141*  BILITOT 1.2 1.8* 1.8* 2.2* 1.8*  PROT 9.7* 8.2* 8.2* 8.2* 7.3  ALBUMIN 2.8* 2.5* 2.3* 2.2* 1.9*   No results for input(s): "LIPASE", "AMYLASE" in the last 168 hours.  No results for input(s): "AMMONIA" in the last 168 hours.  CBC: Recent Labs  Lab 12/27/21 1451 12/28/21 0949 12/29/21 0323 12/29/21 0403 12/29/21 2042 12/30/21 0150 12/31/21 0323  WBC 6.2  --  12.3*  --   --  12.3* 13.3*  NEUTROABS 4.2  --   --   --   --   --   --   HGB 10.6*   < > 10.6* 11.2* 11.6* 10.5* 10.7*  HCT 32.2*   < > 31.1* 33.0* 34.0* 32.0* 32.6*  MCV 79.5*  --  76.2*  --   --  78.6* 79.5*  PLT 369  --  185  --   --  176 154   < > = values in this interval not displayed.    Cardiac Enzymes: No results for input(s): "CKTOTAL", "CKMB", "CKMBINDEX", "TROPONINI" in the last 168 hours.  BNP: BNP (last 3 results) Recent Labs    07/24/21 1433 10/11/21 1230 12/27/21 1451  BNP 3,184.1* 400.8* >4,500.0*    ProBNP (last 3 results) No results for input(s): "PROBNP" in the last 8760 hours.    Other results:  Imaging: No results found.   Medications:     Scheduled Medications:  Chlorhexidine Gluconate Cloth  6 each Topical Daily   feeding supplement (PROSource TF)  45 mL Per Tube TID   mouth rinse  15 mL Mouth Rinse Q2H   pantoprazole sodium  40 mg Per Tube  Daily   polyethylene glycol  17 g Per Tube Daily    Infusions:  sodium chloride 10 mL/hr at 12/31/21 0500   sodium chloride 10 mL/hr at 12/31/21 0500   sodium chloride Stopped (12/30/21 2208)   ceFEPime (MAXIPIME) IV 2 g (12/31/21 0547)   feeding supplement (VITAL 1.5 CAL)     heparin 2,400 Units/hr (12/31/21 0636)   milrinone 0.125 mcg/kg/min (12/31/21 0500)   norepinephrine (LEVOPHED) Adult infusion Stopped (12/30/21 0630)   potassium PHOSPHATE IVPB (in mmol) 15 mmol (12/31/21 1761)   propofol (DIPRIVAN) infusion 30 mcg/kg/min (12/31/21 6073)   vancomycin Stopped (12/31/21 0407)    PRN Medications: sodium chloride, busPIRone, docusate, fentaNYL (SUBLIMAZE) injection, ibuprofen, ipratropium-albuterol, mouth rinse   Assessment/Plan:   1. Mixed cardiogenic/septic shock - echo 12/28/21 LVEF 10% RV sev HK - TEE 7/15 severe biventricular failure. + vegetation on mechanical MVR - Bcx pending, respiratory culture w/ moderate WBCs (mononuclear and PMN) and no organisms -> on vanc/cefepime - CO-OX marginal this am on milrinone 0.125. Now back on low-dose NE to maintain MAP. CI 2.49 at 7 am.  - Titrate NE as needed to maintain MAP and CO.   2. 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 Repeat echo as above.  - PTA Spironolactone, Farxiga, Digoxin and Losartan currently held in the setting of shock and his AKI.  - Echo this admit LVEF 10% RV severely HK.  - Long-term only option is transplant but not sure he will be candidate - CVP 8.    3. 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  - Underwent MVR with mechanical mitral valve in [facility] at [facility].  - TEE 7/15 severe biventricular  failure. + vegetation on mechanical MVR - Bcx pending. -> on vanc/cefepime - ID following. Appreciate assistance. -  Continue heparin   4. Acute hypoxic respiratory failure - intubated 7/15 in setting of shock - CCM managing  5. Paroxysmal Atrial Fibrillation - Maintaining NSR this admission - off amio with liver injury - does not tolerate AF  5. AKI - Due to ATN/shock - Baseline creatinine 0.98 - 1.0.  - Peak 2.6. Improved with support, now back to baseline  6. Shock liver - continue supportive care  7. Autoimmune Disorder  - He has seronegative spondylitis and pyoderma gangrenosum. Followed by Rheumatology as an outpatient.   8. Hypomagnesemia/hypokalemia - Mag repleted, 2.6 yesterday - K 3.3 this am, supp   Length of Stay: 3  FINCH, LINDSAY N PA-C 12/31/2021, 6:58 AM  Advanced Heart Failure Team Pager [phone] (M-F; 7a - 4p)  Please contact [facility] for night-coverage after hours (4p -7a ) and weekends on amion.  Agree with above.  Remains intubated. Sedated but will arouse. Remains febrile 101.5. NE restarted.Remains on broad spectrum abx. REnal function normalized. LFTs still markedly elevated.    General:  Intubated/sedated. Will arouse HEENT: + ETT Neck: supple. RIJ sean Carotids 2+ bilat; no bruits. No lymphadenopathy or thryomegaly appreciated. Cor: PMI nondisplaced. Regular rate & rhythm. Mechanical s1 Lungs: clear Abdomen: soft, nontender, nondistended. No hepatosplenomegaly. No bruits or masses. Good bowel sounds. Extremities: no cyanosis, clubbing, rash, edema Neuro:  Intubated/sedated. Will arouse  Remains intubated. Febrile. On NE and milrinone. Swan numbers stable. Bcx NGTD. Continue supportive care. D/w CCM and ID. Not candidate for extubation until fevers resolved. Continue NE to keep SBP > 100. CI > 2.2  CRITICAL CARE Performed by: Arvilla Meres  Total critical care time: 35 minutes  Critical care time was exclusive of separately  billable procedures and treating other patients.  Critical care was necessary to treat or prevent imminent or life-threatening deterioration.  Critical care was time spent personally by me (independent of midlevel providers or residents) on the following activities: development of treatment plan with patient and/or surrogate as well as nursing, discussions with consultants, evaluation of patient's response to treatment, examination of patient, obtaining history from patient or surrogate, ordering and performing treatments and interventions, ordering and review of laboratory studies, ordering and review of radiographic studies, pulse oximetry and re-evaluation of patient's condition.  Arvilla Meres, MD  8:05 AM

## 2021-12-31 NOTE — Progress Notes (Signed)
Pharmacy Antibiotic Note  Donald [name] is a 36 y.o. male for which pharmacy has been consulted for cefepime and vancomycin dosing for prosthetic valve endocarditis.   Patient with a history of mitral regurgitation s/p valve replacement on Coumadin, HF, AF and prior DVT, and Hx of hidradenitis and pyoderma gangrenosum . Now found to have a vegetation on mechanical mitral valve. Blood cultures are NGTD on 7/14 and 7/16. Scr stable at 0.7. Vanc levels today showed a trough of 14 and a peak of 29, for a therapeutic AUC of 517.   Plan: Continue Vancomycin 750 mg every 8 hours   Continue cefepime 2 gm IV Q 8 hours  F/u cultures, renal function and clinical course  Height: 5\' 10"  (177.8 cm) Weight: 91.5 kg (201 lb 11.5 oz) IBW/kg (Calculated) : 73  Temp (24hrs), Avg:98.1 F (36.7 C), Min:96.4 F (35.8 C), Max:99.5 F (37.5 C)  Recent Labs  Lab 12/27/21 1451 12/27/21 1755 12/28/21 1430 12/28/21 1632 12/28/21 1640 12/28/21 2040 12/29/21 0323 12/29/21 0603 12/30/21 0150 12/30/21 1300 12/31/21 0323 12/31/21 1036 12/31/21 1238  WBC 6.2  --   --   --   --   --  12.3*  --  12.3*  --  13.3*  --   --   CREATININE 1.40*   < >  --   --    < > 2.59* 2.39*  --  1.33* 0.97 0.70  --   --   LATICACIDVEN 2.3*   < > >9.0* >9.0*  --  6.5*  --  2.2* 1.8  --   --   --   --   VANCOTROUGH  --   --   --   --   --   --   --   --   --   --   --  14*  --   VANCOPEAK  --   --   --   --   --   --   --   --   --   --   --   --  53*  VANCORANDOM  --   --   --   --   --   --  22  --   --   --   --   --   --    < > = values in this interval not displayed.     Estimated Creatinine Clearance: 149.4 mL/min (by C-G formula based on SCr of 0.7 mg/dL).    No Known Allergies  Antimicrobials this admission: cefepime 7/14 >>  vancomycin 7/14 >>  metronidazole 7/14 >> 7/14  Microbiology results: *FYI it looks like only 1 cx on 7/14 prior to abx - several orders "cancelled" 7/15 BCx x 2 > ngtd 7/14 BCx   ngtd 7/16 Resp Cx > ngtd   Thank you for allowing pharmacy to be a part of this patient's care.  8/16, PharmD, BCPS, BCIDP Infectious Diseases Clinical Pharmacist Phone: [phone] 12/31/2021 3:02 PM

## 2021-12-31 NOTE — Progress Notes (Signed)
[facility]   Reason for visit: Follow up on MV vegetation  Interval History: now off of cooling blanket, fever curve trending down, WBC 13.3.  remains intubated.     Physical Exam: Constitutional:  Vitals:   12/31/21 0900 12/31/21 1000  BP:    Pulse: 80 79  Resp: (!) 31 (!) 33  Temp: 98.1 F (36.7 C) 97.9 F (36.6 C)  SpO2: 98% 98%   patient appears in NAD HENT: + ET Respiratory: respiratory effort on vent Skin: multiple small   Review of Systems: Unable to be assessed due to patient factors  Lab Results  Component Value Date   WBC 13.3 (H) 12/31/2021   HGB 10.7 (L) 12/31/2021   HCT 32.6 (L) 12/31/2021   MCV 79.5 (L) 12/31/2021   PLT 154 12/31/2021    Lab Results  Component Value Date   CREATININE 0.70 12/31/2021   BUN 22 (H) 12/31/2021   NA 137 12/31/2021   K 3.3 (L) 12/31/2021   CL 104 12/31/2021   CO2 22 12/31/2021    Lab Results  Component Value Date   ALT 2,229 (H) 12/31/2021   AST 4,291 (H) 12/31/2021   ALKPHOS 141 (H) 12/31/2021     Microbiology: Recent Results (from the past 240 hour(s))  Culture, blood (routine x 2)     Status: None (Preliminary result)   Collection Time: 12/27/21  2:52 PM   Specimen: BLOOD  Result Value Ref Range Status   Specimen Description BLOOD RIGHT ANTECUBITAL  Final   Special Requests   Final    BOTTLES DRAWN AEROBIC AND ANAEROBIC Blood Culture adequate volume   Culture   Final    NO GROWTH 2 DAYS Performed at [facility], [address] [place], [place] [address]    Report Status PENDING  Incomplete  Resp Panel by RT-PCR (Flu A&B, Covid) Anterior Nasal Swab     Status: None   Collection Time: 12/27/21  6:04 PM   Specimen: Anterior Nasal Swab  Result Value Ref Range Status   SARS Coronavirus 2 by RT PCR NEGATIVE NEGATIVE Final    Comment: (NOTE) SARS-CoV-2 target nucleic acids are NOT DETECTED.  The SARS-CoV-2 RNA is generally detectable in upper respiratory specimens  during the acute phase of infection. The lowest concentration of SARS-CoV-2 viral copies this assay can detect is 138 copies/mL. A negative result does not preclude SARS-Cov-2 infection and should not be used as the sole basis for treatment or other patient management decisions. A negative result may occur with  improper specimen collection/handling, submission of specimen other than nasopharyngeal swab, presence of viral mutation(s) within the areas targeted by this assay, and inadequate number of viral copies(<138 copies/mL). A negative result must be combined with clinical observations, patient history, and epidemiological information. The expected result is Negative.  Fact Sheet for Patients:  BloggerCourse.com  Fact Sheet for Healthcare Providers:  SeriousBroker.it  This test is no t yet approved or cleared by the [place] FDA and  has been authorized for detection and/or diagnosis of SARS-CoV-2 by FDA under an Emergency Use Authorization (EUA). This EUA will remain  in effect (meaning this test can be used) for the duration of the COVID-19 declaration under Section 564(b)(1) of the Act, 21 U.S.C.section 360bbb-3(b)(1), unless the authorization is terminated  or revoked sooner.       Influenza A by PCR NEGATIVE NEGATIVE Final   Influenza B by PCR NEGATIVE NEGATIVE Final    Comment: (  NOTE) The Xpert Xpress SARS-CoV-2/FLU/RSV plus assay is intended as an aid in the diagnosis of influenza from Nasopharyngeal swab specimens and should not be used as a sole basis for treatment. Nasal washings and aspirates are unacceptable for Xpert Xpress SARS-CoV-2/FLU/RSV testing.  Fact Sheet for Patients: BloggerCourse.com  Fact Sheet for Healthcare Providers: SeriousBroker.it  This test is not yet approved or cleared by the [place] FDA and has been authorized for detection  and/or diagnosis of SARS-CoV-2 by FDA under an Emergency Use Authorization (EUA). This EUA will remain in effect (meaning this test can be used) for the duration of the COVID-19 declaration under Section 564(b)(1) of the Act, 21 U.S.C. section 360bbb-3(b)(1), unless the authorization is terminated or revoked.  Performed at [facility], [address] [place], [place] [address]   Culture, blood (Routine X 2) w Reflex to ID Panel     Status: None (Preliminary result)   Collection Time: 12/28/21 10:45 AM   Specimen: BLOOD  Result Value Ref Range Status   Specimen Description BLOOD CENTRAL LINE  Final   Special Requests   Final    BOTTLES DRAWN AEROBIC AND ANAEROBIC Blood Culture adequate volume   Culture   Final    NO GROWTH 2 DAYS Performed at [facility], [address], [place], [place] [address]    Report Status PENDING  Incomplete  Culture, Respiratory w Gram Stain     Status: None   Collection Time: 12/29/21  9:00 AM   Specimen: Tracheal Aspirate; Respiratory  Result Value Ref Range Status   Specimen Description TRACHEAL ASPIRATE  Final   Special Requests NONE  Final   Gram Stain   Final    MODERATE WBC PRESENT,BOTH PMN AND MONONUCLEAR NO ORGANISMS SEEN    Culture   Final    NO GROWTH 2 DAYS Performed at [facility], [address], [place], [place] [address]    Report Status 12/31/2021 FINAL  Final  Culture, blood (Routine X 2) w Reflex to ID Panel     Status: None (Preliminary result)   Collection Time: 12/29/21  9:58 AM   Specimen: BLOOD LEFT HAND  Result Value Ref Range Status   Specimen Description BLOOD LEFT HAND  Final   Special Requests   Final    BOTTLES DRAWN AEROBIC AND ANAEROBIC Blood Culture adequate volume   Culture   Final    NO GROWTH < 24 HOURS Performed at [facility], [address] [place], [place] [address]    Report Status PENDING  Incomplete  Culture, blood (Routine X 2) w Reflex to ID Panel     Status: None  (Preliminary result)   Collection Time: 12/29/21  9:58 AM   Specimen: BLOOD LEFT ARM  Result Value Ref Range Status   Specimen Description BLOOD LEFT ARM  Final   Special Requests   Final    BOTTLES DRAWN AEROBIC AND ANAEROBIC Blood Culture results may not be optimal due to an inadequate volume of blood received in culture bottles   Culture   Final    NO GROWTH < 24 HOURS Performed at [facility], [address] [place], [place] [address]    Report Status PENDING  Incomplete    Impression/Plan:  Prosthetic valve endocarditis - vegetation noted on MV associated with fever, shock.  Blood cultures to date are no growth to date.  Clinically seems to be improving.  Will continue with vancomycin and cefepime.  No additional antibiotics indicated at this time.  Fever - fever curve is trending down and will continue to monitor.  Mixed cardiogenic and septic shock - he remains on pressor support with levophed likely secondary to heart failure and sepsis.

## 2022-01-01 ENCOUNTER — Inpatient Hospital Stay (HOSPITAL_COMMUNITY): Payer: Medicaid Other

## 2022-01-01 DIAGNOSIS — I059 Rheumatic mitral valve disease, unspecified: Secondary | ICD-10-CM

## 2022-01-01 DIAGNOSIS — I5023 Acute on chronic systolic (congestive) heart failure: Secondary | ICD-10-CM | POA: Diagnosis not present

## 2022-01-01 DIAGNOSIS — R570 Cardiogenic shock: Secondary | ICD-10-CM | POA: Diagnosis not present

## 2022-01-01 DIAGNOSIS — A419 Sepsis, unspecified organism: Secondary | ICD-10-CM | POA: Diagnosis not present

## 2022-01-01 DIAGNOSIS — Z9911 Dependence on respirator [ventilator] status: Secondary | ICD-10-CM | POA: Diagnosis not present

## 2022-01-01 DIAGNOSIS — R7401 Elevation of levels of liver transaminase levels: Secondary | ICD-10-CM | POA: Diagnosis not present

## 2022-01-01 DIAGNOSIS — K529 Noninfective gastroenteritis and colitis, unspecified: Secondary | ICD-10-CM | POA: Diagnosis not present

## 2022-01-01 DIAGNOSIS — J9601 Acute respiratory failure with hypoxia: Secondary | ICD-10-CM | POA: Diagnosis not present

## 2022-01-01 DIAGNOSIS — I429 Cardiomyopathy, unspecified: Secondary | ICD-10-CM | POA: Diagnosis not present

## 2022-01-01 DIAGNOSIS — R6521 Severe sepsis with septic shock: Secondary | ICD-10-CM | POA: Diagnosis not present

## 2022-01-01 DIAGNOSIS — Z4682 Encounter for fitting and adjustment of non-vascular catheter: Secondary | ICD-10-CM | POA: Diagnosis not present

## 2022-01-01 DIAGNOSIS — I5082 Biventricular heart failure: Secondary | ICD-10-CM | POA: Diagnosis not present

## 2022-01-01 LAB — CBC
HCT: 29.1 % — ABNORMAL LOW (ref 39.0–52.0)
HCT: 30.6 % — ABNORMAL LOW (ref 39.0–52.0)
Hemoglobin: 9.5 g/dL — ABNORMAL LOW (ref 13.0–17.0)
Hemoglobin: 10.1 g/dL — ABNORMAL LOW (ref 13.0–17.0)
MCH: 26 pg (ref 26.0–34.0)
MCH: 25.7 pg — ABNORMAL LOW (ref 26.0–34.0)
MCHC: 32.6 g/dL (ref 30.0–36.0)
MCHC: 33 g/dL (ref 30.0–36.0)
MCV: 79.5 fL — ABNORMAL LOW (ref 80.0–100.0)
MCV: 77.9 fL — ABNORMAL LOW (ref 80.0–100.0)
Platelets: 166 10*3/uL (ref 150–400)
Platelets: 194 10*3/uL (ref 150–400)
RBC: 3.66 MIL/uL — ABNORMAL LOW (ref 4.22–5.81)
RBC: 3.93 MIL/uL — ABNORMAL LOW (ref 4.22–5.81)
RDW: 15.8 % — ABNORMAL HIGH (ref 11.5–15.5)
RDW: 15.5 % (ref 11.5–15.5)
WBC: 12.4 10*3/uL — ABNORMAL HIGH (ref 4.0–10.5)
WBC: 11.8 10*3/uL — ABNORMAL HIGH (ref 4.0–10.5)
nRBC: 0.2 % (ref 0.0–0.2)
nRBC: 0.3 % — ABNORMAL HIGH (ref 0.0–0.2)

## 2022-01-01 LAB — POCT I-STAT 7, (LYTES, BLD GAS, ICA,H+H)
Acid-Base Excess: 1 mmol/L (ref 0.0–2.0)
Acid-Base Excess: 1 mmol/L (ref 0.0–2.0)
Bicarbonate: 27.4 mmol/L (ref 20.0–28.0)
Bicarbonate: 26.7 mmol/L (ref 20.0–28.0)
Calcium, Ion: 1.12 mmol/L — ABNORMAL LOW (ref 1.15–1.40)
Calcium, Ion: 1.18 mmol/L (ref 1.15–1.40)
HCT: 32 % — ABNORMAL LOW (ref 39.0–52.0)
HCT: 32 % — ABNORMAL LOW (ref 39.0–52.0)
Hemoglobin: 10.9 g/dL — ABNORMAL LOW (ref 13.0–17.0)
Hemoglobin: 10.9 g/dL — ABNORMAL LOW (ref 13.0–17.0)
O2 Saturation: 100 %
O2 Saturation: 97 %
Patient temperature: 38
Patient temperature: 38.6
Potassium: 4.1 mmol/L (ref 3.5–5.1)
Potassium: 3.7 mmol/L (ref 3.5–5.1)
Sodium: 142 mmol/L (ref 135–145)
Sodium: 139 mmol/L (ref 135–145)
TCO2: 29 mmol/L (ref 22–32)
TCO2: 28 mmol/L (ref 22–32)
pCO2 arterial: 51.2 mmHg — ABNORMAL HIGH (ref 32–48)
pCO2 arterial: 50.7 mmHg — ABNORMAL HIGH (ref 32–48)
pH, Arterial: 7.341 — ABNORMAL LOW (ref 7.35–7.45)
pH, Arterial: 7.337 — ABNORMAL LOW (ref 7.35–7.45)
pO2, Arterial: 182 mmHg — ABNORMAL HIGH (ref 83–108)
pO2, Arterial: 101 mmHg (ref 83–108)

## 2022-01-01 LAB — GLUCOSE, CAPILLARY
Glucose-Capillary: 114 mg/dL — ABNORMAL HIGH (ref 70–99)
Glucose-Capillary: 63 mg/dL — ABNORMAL LOW (ref 70–99)
Glucose-Capillary: 119 mg/dL — ABNORMAL HIGH (ref 70–99)
Glucose-Capillary: 122 mg/dL — ABNORMAL HIGH (ref 70–99)
Glucose-Capillary: 108 mg/dL — ABNORMAL HIGH (ref 70–99)
Glucose-Capillary: 123 mg/dL — ABNORMAL HIGH (ref 70–99)
Glucose-Capillary: 125 mg/dL — ABNORMAL HIGH (ref 70–99)

## 2022-01-01 LAB — BASIC METABOLIC PANEL
Anion gap: 3 — ABNORMAL LOW (ref 5–15)
Anion gap: 10 (ref 5–15)
BUN: 12 mg/dL (ref 6–20)
BUN: 10 mg/dL (ref 6–20)
CO2: 23 mmol/L (ref 22–32)
CO2: 23 mmol/L (ref 22–32)
Calcium: 7.2 mg/dL — ABNORMAL LOW (ref 8.9–10.3)
Calcium: 7.8 mg/dL — ABNORMAL LOW (ref 8.9–10.3)
Chloride: 109 mmol/L (ref 98–111)
Chloride: 105 mmol/L (ref 98–111)
Creatinine, Ser: 0.68 mg/dL (ref 0.61–1.24)
Creatinine, Ser: 0.63 mg/dL (ref 0.61–1.24)
GFR, Estimated: >60 mL/min (ref >60)
GFR, Estimated: >60 mL/min (ref >60)
Glucose, Bld: 116 mg/dL — ABNORMAL HIGH (ref 70–99)
Glucose, Bld: 119 mg/dL — ABNORMAL HIGH (ref 70–99)
Potassium: 3.5 mmol/L (ref 3.5–5.1)
Potassium: 3.8 mmol/L (ref 3.5–5.1)
Sodium: 135 mmol/L (ref 135–145)
Sodium: 138 mmol/L (ref 135–145)

## 2022-01-01 LAB — PHOSPHORUS
Phosphorus: 1.6 mg/dL — ABNORMAL LOW (ref 2.5–4.6)
Phosphorus: 1.7 mg/dL — ABNORMAL LOW (ref 2.5–4.6)

## 2022-01-01 LAB — PROTIME-INR
INR: 3.7 — ABNORMAL HIGH (ref 0.8–1.2)
Prothrombin Time: 36.1 seconds — ABNORMAL HIGH (ref 11.4–15.2)

## 2022-01-01 LAB — HEPATIC FUNCTION PANEL
ALT: 2449 U/L — ABNORMAL HIGH (ref 0–44)
AST: 4151 U/L — ABNORMAL HIGH (ref 15–41)
Albumin: 1.9 g/dL — ABNORMAL LOW (ref 3.5–5.0)
Alkaline Phosphatase: 140 U/L — ABNORMAL HIGH (ref 38–126)
Bilirubin, Direct: 0.7 mg/dL — ABNORMAL HIGH (ref 0.0–0.2)
Indirect Bilirubin: 0.7 mg/dL (ref 0.3–0.9)
Total Bilirubin: 1.4 mg/dL — ABNORMAL HIGH (ref 0.3–1.2)
Total Protein: 6.8 g/dL (ref 6.5–8.1)

## 2022-01-01 LAB — COOXEMETRY PANEL
Carboxyhemoglobin: 0.7 % (ref 0.5–1.5)
Methemoglobin: 0.7 % (ref 0.0–1.5)
O2 Saturation: 62.8 %
Total hemoglobin: 9.6 g/dL — ABNORMAL LOW (ref 12.0–16.0)

## 2022-01-01 LAB — HEPARIN LEVEL (UNFRACTIONATED)
Heparin Unfractionated: 0.31 IU/mL (ref 0.30–0.70)
Heparin Unfractionated: 0.43 IU/mL (ref 0.30–0.70)

## 2022-01-01 LAB — TRIGLYCERIDES: Triglycerides: 100 mg/dL (ref <150)

## 2022-01-01 LAB — AMMONIA: Ammonia: 24 umol/L (ref 9–35)

## 2022-01-01 LAB — MAGNESIUM: Magnesium: 1.5 mg/dL — ABNORMAL LOW (ref 1.7–2.4)

## 2022-01-01 MED ORDER — FUROSEMIDE 10 MG/ML IJ SOLN
40.0000 mg | Freq: Once | INTRAMUSCULAR | Status: AC
Start: 2022-01-01 — End: 2022-01-01
  Administered 2022-01-01: 40 mg via INTRAVENOUS
  Filled 2022-01-01: qty 4

## 2022-01-01 MED ORDER — MAGNESIUM SULFATE 2 GM/50ML IV SOLN
2.0000 g | Freq: Once | INTRAVENOUS | Status: AC
Start: 2022-01-01 — End: 2022-01-02
  Administered 2022-01-01: 2 g via INTRAVENOUS
  Filled 2022-01-01: qty 50

## 2022-01-01 MED ORDER — LOPERAMIDE HCL 1 MG/7.5ML PO SUSP
2.0000 mg | Freq: Three times a day (TID) | ORAL | Status: DC | PRN
  Administered 2022-01-04: 2 mg
  Filled 2022-01-01 (×2): qty 15

## 2022-01-01 MED ORDER — LOPERAMIDE HCL 2 MG PO CAPS: 2.0000 mg | ORAL_CAPSULE | Freq: Three times a day (TID) | ORAL | Status: DC | PRN

## 2022-01-01 MED ORDER — POLYETHYLENE GLYCOL 3350 17 G PO PACK
17.0000 g | PACK | Freq: Every day | ORAL | Status: DC | PRN
Start: 2022-01-03 — End: 2022-01-01

## 2022-01-01 MED ORDER — POTASSIUM PHOSPHATES 15 MMOLE/5ML IV SOLN
45.0000 mmol | Freq: Once | INTRAVENOUS | Status: AC
  Administered 2022-01-01: 45 mmol via INTRAVENOUS
  Filled 2022-01-01: qty 15

## 2022-01-01 MED ORDER — FENTANYL CITRATE PF 50 MCG/ML IJ SOSY: 25.0000 ug | PREFILLED_SYRINGE | INTRAMUSCULAR | Status: DC | PRN

## 2022-01-01 MED ORDER — POTASSIUM & SODIUM PHOSPHATES 280-160-250 MG PO PACK
2.0000 | PACK | Freq: Once | ORAL | Status: AC
Start: 2022-01-01 — End: 2022-01-01
  Administered 2022-01-01: 2
  Filled 2022-01-01: qty 2

## 2022-01-01 MED ORDER — OXYMETAZOLINE HCL 0.05 % NA SOLN
1.0000 | Freq: Two times a day (BID) | NASAL | Status: DC
Start: 2022-01-01 — End: 2022-01-02
  Administered 2022-01-01 – 2022-01-02 (×3): 1 via NASAL
  Filled 2022-01-01: qty 30

## 2022-01-01 MED ORDER — POTASSIUM CHLORIDE 20 MEQ PO PACK
40.0000 meq | PACK | Freq: Once | ORAL | Status: AC
  Administered 2022-01-01: 40 meq
  Filled 2022-01-01: qty 2

## 2022-01-01 MED ORDER — OXYCODONE HCL 5 MG/5ML PO SOLN
5.0000 mg | ORAL | Status: DC | PRN
  Administered 2022-01-01 – 2022-01-02 (×3): 5 mg
  Filled 2022-01-01 (×3): qty 5

## 2022-01-01 MED ORDER — POTASSIUM PHOSPHATES 15 MMOLE/5ML IV SOLN
30.0000 mmol | Freq: Once | INTRAVENOUS | Status: AC
  Administered 2022-01-02: 30 mmol via INTRAVENOUS
  Filled 2022-01-01 (×2): qty 10

## 2022-01-01 NOTE — Progress Notes (Signed)
ANTICOAGULATION CONSULT NOTE  Pharmacy Consult for heparin (warfarin on hold) Indication:  MVR /AFib  No Known Allergies  Patient Measurements: Height: 5\' 10"  (177.8 cm) Weight: 91.5 kg (201 lb 11.5 oz) IBW/kg (Calculated) : 73 Heparin Dosing Weight: 90.7 kg  Vital Signs: Temp: 97.2 F (36.2 C) (07/19 0315) Temp Source: Core (07/19 0000) BP: 93/60 (07/19 0312) Pulse Rate: 62 (07/19 0315)  Labs: Recent Labs    12/30/21 0150 12/30/21 0805 12/30/21 1300 12/30/21 1707 12/31/21 0323 12/31/21 1036 12/31/21 1839 01/01/22 0346  HGB 10.5*  --   --   --  10.7*  --   --  9.5*  HCT 32.0*  --   --   --  32.6*  --   --  29.1*  PLT 176  --   --   --  154  --   --  166  LABPROT 38.3*  --   --   --  38.3*  --   --  36.1*  INR 4.0*  --   --   --  4.0*  --   --  3.7*  HEPARINUNFRC  --    < >  --    < >  --  0.13* 0.21* 0.31  CREATININE 1.33*  --  0.97  --  0.70  --   --   --    < > = values in this interval not displayed.     Estimated Creatinine Clearance: 149.4 mL/min (by C-G formula based on SCr of 0.7 mg/dL).   Medical History: Past Medical History:  Diagnosis Date   Anemia    Autoimmune disorder (HCC)    pyoderma gangrenosum   CHF (congestive heart failure) (HCC)    Chronic systolic heart failure (HCC)    a. EF 35-40% by echo in 07/2018 b. EF at 45% by repeat echo in 04/2020   DVT (deep venous thrombosis) (HCC)    h/o   Dysrhythmia    Mitral regurgitation    a. 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 b. 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HCC)    arthritis   Tricuspid regurgitation    Medications:   Scheduled:   Infusions:   sodium chloride 10 mL/hr at 01/01/22 0300   sodium chloride 10 mL/hr at 01/01/22 0300   sodium chloride  Stopped (12/30/21 2208)   ceFEPime (MAXIPIME) IV Stopped (12/31/21 2211)   feeding supplement (VITAL 1.5 CAL) 50 mL/hr at 01/01/22 0300   heparin 2,750 Units/hr (01/01/22 0300)   milrinone 0.125 mcg/kg/min (01/01/22 0300)   norepinephrine (LEVOPHED) Adult infusion 4 mcg/min (01/01/22 0300)   propofol (DIPRIVAN) infusion 35 mcg/kg/min (01/01/22 0300)   vancomycin 150 mL/hr at 01/01/22 0300   Assessment: 35 yom with a history of mitral regurgitation s/p valve replacement on Coumadin, HF, AF and prior DVT, and Hx of hidradenitis and pyoderma gangrenosum . Patient presenting with weakness.  Patient taking warfarin pta. Home dose is 4mg  daily per last [facility] note. INR 1.4 on 7/10 instructed to take 6mg  that night and then continue 4mg  daily. Last taken 7/14.  INR continues to be elevated at 4, but likely reflective of hepatic dysfunction with AST4000/ALT2000, not true anticoagulation.    Heparin level remains subtherapeutic and decreased to 0.13  despite heparin rate  increase 2400 uts/hr.  Patient had slight infiltrate of iv site with heparin> changed sites    No bleeding noted h/h stable  SCr has come down significantly.  7/19 AM update:  Heparin level therapeutic   Goal of Therapy:  Heparin level: 0.3-0.7 units/mL (targeting low end of goal range) Monitor platelets by anticoagulation protocol: Yes   Plan:  Cont heparin 2750 units/hr 1200 heparin level  Abran Duke, PharmD, BCPS Clinical Pharmacist Phone: [phone]

## 2022-01-01 NOTE — Procedures (Signed)
Cortrak  Person Inserting Tube:  Osa Craver, RD Tube Type:  Cortrak - 43 inches Tube Size:  10 Tube Location:  Left nare Secured by: Bridle Technique Used to Measure Tube Placement:  Marking at nare/corner of mouth Cortrak Secured At:  78 cm Procedure Comments:  Cortrak Tube Team Note:  Consult received to place a Cortrak feeding tube. INR 3.7, Cut-off per Manufacturer is 2.0. RD discussed with PCCM and ok to proceed after administration of Afrin nasal spray (ordered by MD, administered by RN) and heparin held until after procedure.   X-ray is required, abdominal x-ray has been ordered by the Cortrak team. Please confirm tube placement before using the Cortrak tube.   If the tube becomes dislodged please keep the tube and contact the Cortrak team at [email or web address] ([email or web address]) for replacement.  If after hours and replacement cannot be delayed, place a NG tube and confirm placement with an abdominal x-ray.    Romelle Starcher MS, RDN, LDN, CNSC Registered Dietitian 3 Clinical Nutrition RD Pager and On-Call Pager Number Located in [place]

## 2022-01-01 NOTE — Progress Notes (Signed)
[facility]   Reason for visit: Follow up on prosthetic MV endocarditis  Interval History: no positive growth from cultures to date; WBC 12.4, Tmax 101.1 overnight.  Now extubated.     Physical Exam: Constitutional:  Vitals:   01/01/22 0430 01/01/22 0915  BP:    Pulse: 65   Resp: 17   Temp: 97.9 F (36.6 C)   SpO2: 100% 100%   patient appears in NAD Eyes: anicteric HENT: + nasal cannula Respiratory: Normal respiratory effort  Review of Systems: Unable to be assessed due to patient factors  Lab Results  Component Value Date   WBC 12.4 (H) 01/01/2022   HGB 9.5 (L) 01/01/2022   HCT 29.1 (L) 01/01/2022   MCV 79.5 (L) 01/01/2022   PLT 166 01/01/2022    Lab Results  Component Value Date   CREATININE 0.68 01/01/2022   BUN 12 01/01/2022   NA 135 01/01/2022   K 3.5 01/01/2022   CL 109 01/01/2022   CO2 23 01/01/2022    Lab Results  Component Value Date   ALT 2,449 (H) 01/01/2022   AST 4,151 (H) 01/01/2022   ALKPHOS 140 (H) 01/01/2022     Microbiology: Recent Results (from the past 240 hour(s))  Culture, blood (routine x 2)     Status: None (Preliminary result)   Collection Time: 12/27/21  2:52 PM   Specimen: BLOOD  Result Value Ref Range Status   Specimen Description BLOOD RIGHT ANTECUBITAL  Final   Special Requests   Final    BOTTLES DRAWN AEROBIC AND ANAEROBIC Blood Culture adequate volume   Culture   Final    NO GROWTH 4 DAYS Performed at [facility], [address] [place], [place] [address]    Report Status PENDING  Incomplete  Resp Panel by RT-PCR (Flu A&B, Covid) Anterior Nasal Swab     Status: None   Collection Time: 12/27/21  6:04 PM   Specimen: Anterior Nasal Swab  Result Value Ref Range Status   SARS Coronavirus 2 by RT PCR NEGATIVE NEGATIVE Final    Comment: (NOTE) SARS-CoV-2 target nucleic acids are NOT DETECTED.  The SARS-CoV-2 RNA is generally detectable in upper respiratory specimens during the acute  phase of infection. The lowest concentration of SARS-CoV-2 viral copies this assay can detect is 138 copies/mL. A negative result does not preclude SARS-Cov-2 infection and should not be used as the sole basis for treatment or other patient management decisions. A negative result may occur with  improper specimen collection/handling, submission of specimen other than nasopharyngeal swab, presence of viral mutation(s) within the areas targeted by this assay, and inadequate number of viral copies(<138 copies/mL). A negative result must be combined with clinical observations, patient history, and epidemiological information. The expected result is Negative.  Fact Sheet for Patients:  BloggerCourse.com  Fact Sheet for Healthcare Providers:  SeriousBroker.it  This test is no t yet approved or cleared by the [place] FDA and  has been authorized for detection and/or diagnosis of SARS-CoV-2 by FDA under an Emergency Use Authorization (EUA). This EUA will remain  in effect (meaning this test can be used) for the duration of the COVID-19 declaration under Section 564(b)(1) of the Act, 21 U.S.C.section 360bbb-3(b)(1), unless the authorization is terminated  or revoked sooner.       Influenza A by PCR NEGATIVE NEGATIVE Final   Influenza B by PCR NEGATIVE NEGATIVE Final    Comment: (NOTE) The Xpert Xpress SARS-CoV-2/FLU/RSV plus assay  is intended as an aid in the diagnosis of influenza from Nasopharyngeal swab specimens and should not be used as a sole basis for treatment. Nasal washings and aspirates are unacceptable for Xpert Xpress SARS-CoV-2/FLU/RSV testing.  Fact Sheet for Patients: BloggerCourse.com  Fact Sheet for Healthcare Providers: SeriousBroker.it  This test is not yet approved or cleared by the [place] FDA and has been authorized for detection and/or diagnosis of  SARS-CoV-2 by FDA under an Emergency Use Authorization (EUA). This EUA will remain in effect (meaning this test can be used) for the duration of the COVID-19 declaration under Section 564(b)(1) of the Act, 21 U.S.C. section 360bbb-3(b)(1), unless the authorization is terminated or revoked.  Performed at [facility], [address], [place], [place] [address]   Culture, blood (Routine X 2) w Reflex to ID Panel     Status: None (Preliminary result)   Collection Time: 12/28/21 10:45 AM   Specimen: BLOOD  Result Value Ref Range Status   Specimen Description BLOOD CENTRAL LINE  Final   Special Requests   Final    BOTTLES DRAWN AEROBIC AND ANAEROBIC Blood Culture adequate volume   Culture   Final    NO GROWTH 4 DAYS Performed at [facility], [address], [place], [place] [address]    Report Status PENDING  Incomplete  Culture, Respiratory w Gram Stain     Status: None   Collection Time: 12/29/21  9:00 AM   Specimen: Tracheal Aspirate; Respiratory  Result Value Ref Range Status   Specimen Description TRACHEAL ASPIRATE  Final   Special Requests NONE  Final   Gram Stain   Final    MODERATE WBC PRESENT,BOTH PMN AND MONONUCLEAR NO ORGANISMS SEEN    Culture   Final    NO GROWTH 2 DAYS Performed at [facility], [address], [place], [place] [address]    Report Status 12/31/2021 FINAL  Final  Culture, blood (Routine X 2) w Reflex to ID Panel     Status: None (Preliminary result)   Collection Time: 12/29/21  9:58 AM   Specimen: BLOOD LEFT HAND  Result Value Ref Range Status   Specimen Description BLOOD LEFT HAND  Final   Special Requests   Final    BOTTLES DRAWN AEROBIC AND ANAEROBIC Blood Culture adequate volume   Culture   Final    NO GROWTH 3 DAYS Performed at [facility], [address] [place], [place] [address]    Report Status PENDING  Incomplete  Culture, blood (Routine X 2) w Reflex to ID Panel     Status: None (Preliminary result)    Collection Time: 12/29/21  9:58 AM   Specimen: BLOOD LEFT ARM  Result Value Ref Range Status   Specimen Description BLOOD LEFT ARM  Final   Special Requests   Final    BOTTLES DRAWN AEROBIC AND ANAEROBIC Blood Culture results may not be optimal due to an inadequate volume of blood received in culture bottles   Culture   Final    NO GROWTH 3 DAYS Performed at [facility], [address] [place], [place] [address]    Report Status PENDING  Incomplete    Impression/Plan:  1. 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  No positive cultures to date so will need to remain on vancomycin and cefepime for a prolonged duration.   No changes in the treatment regimen at this time  2.  Fever -  overall fever curve trending down.  Not unexpected to have continued fever with endocarditis.  Clinically he seems to be improving, now extubated.  WBC stable. Will continue to monitor  3.  Acute kidney insufficiency - creat back down to normal.  Will continue to monitor while on vancomycin.

## 2022-01-01 NOTE — Progress Notes (Signed)
eLink Physician-Brief Progress Note Patient Name: ZEPPELIN COMMISSO DOB: 02/27/1986 MRN: [id number]   Date of Service  01/01/2022  HPI/Events of Note    eICU Interventions  hypophos Hypomagnesemia -repleted      Intervention Category Intermediate Interventions: Electrolyte abnormality - evaluation and management  Bettyjane Shenoy V. Hadlyn Amero 01/01/2022, 10:19 PM

## 2022-01-01 NOTE — Progress Notes (Signed)
eLink Physician-Brief Progress Note Patient Name: Donald [name] DOB: 1985/07/07 MRN: [id number]   Date of Service  01/01/2022  HPI/Events of Note  Patient having loose stools.  eICU Interventions  PRN Imodium ordered.        Donald [name] 01/01/2022, 6:04 AM

## 2022-01-01 NOTE — Procedures (Signed)
Extubation Procedure Note  Patient Details:   Name: Donald [name] DOB: 1986-05-07 MRN: [id number]   Airway Documentation:    Vent end date: 01/01/22 Vent end time: 0915   Evaluation  O2 sats: stable throughout Complications: No apparent complications Patient did tolerate procedure well. Bilateral Breath Sounds: Clear, Diminished   No  Pt extubated per physician order. Pt with positive cuff leak and suctioned via ETT/orally prior. Upon extubation pt with good cough and no stridor. Pt unable to speak to staff at this time but follows simple commands. No distress noted. Dr. Chestine Spore to pt bedside. No new orders received. RT will continue to monitor and be available as needed.   Derinda Late 01/01/2022, 9:27 AM

## 2022-01-01 NOTE — Progress Notes (Signed)
[facility] ADULT ICU REPLACEMENT PROTOCOL   The patient does apply for the [facility] Adult ICU Electrolyte Replacment Protocol based on the criteria listed below:   1.Exclusion criteria: TCTS patients, ECMO patients, and Dialysis patients 2. Is GFR >/= 30 ml/min? Yes.    Patient's GFR today is >60 3. Is SCr </= 2? Yes.   Patient's SCr is 0.68 mg/dL 4. Did SCr increase >/= 0.5 in 24 hours? No. 5.Pt's weight >40kg  Yes.   6. Abnormal electrolyte(s): K 3.5, Phos 1.6  7. Electrolytes replaced per protocol 8.  Call MD STAT for K+ </= 2.5, Phos </= 1, or Mag </= 1 Physician:    Darrick Grinder 01/01/2022 6:38 AM

## 2022-01-01 NOTE — Progress Notes (Addendum)
Advanced Heart Failure Rounding Note   Subjective:    Remains intubated/sedated. FiO2 30%   ID following. On vanc/cefepime. NGTD from [facility] 07/15 and 07/16.  Had fever last evening. Tmax 101.  Afebrile since midnight.   Lactate has cleared. LFTS have peaked. Renal function back to baseline.  TEE + vegetation on MV  CO-OX 63%  Swan #s on milrinone 0.125 + NE 4 CVP 14 PAP 42/28 Therm CO/CI 6.57/3.16   Awake on the vent.     Objective:     Vital Signs:   Temp:  [97.2 F (36.2 C)-101.1 F (38.4 C)] 97.9 F (36.6 C) (07/19 0430) Pulse Rate:  [61-83] 65 (07/19 0430) Resp:  [14-36] 17 (07/19 0430) BP: (90-125)/(60-93) 125/80 (07/19 0400) SpO2:  [92 %-100 %] 100 % (07/19 0430) Arterial Line BP: (92-122)/(57-85) 105/68 (07/19 0430) FiO2 (%):  [30 %] 30 % (07/19 0400) Weight:  [93 kg] 93 kg (07/19 0431) Last BM Date : 12/31/21  Weight change: Filed Weights   12/30/21 0432 12/31/21 0500 01/01/22 0431  Weight: 89.2 kg 91.5 kg 93 kg    Intake/Output:   Intake/Output Summary (Last 24 hours) at 01/01/2022 0704 Last data filed at 01/01/2022 0500 Gross per 24 hour  Intake 2994.81 ml  Output 1175 ml  Net 1819.81 ml   CVP 14  General:  ETT HEENT: normal Neck: supple. JVP to jaw . Carotids 2+ bilat; no bruits. No lymphadenopathy or thryomegaly appreciated. RIJ swan.  Cor: PMI nondisplaced. Regular rate & rhythm. No rubs, gallops. Mechanical S1. Lungs: clear Abdomen: soft, nontender, nondistended. No hepatosplenomegaly. No bruits or masses. Good bowel sounds. Extremities: no cyanosis, clubbing, rash, edema Neuro: Awake. Follows commands. MAE x4  Telemetry: SR 60-70s   Labs: Basic Metabolic Panel: Recent Labs  Lab 12/28/21 1034 12/28/21 1552 12/29/21 0323 12/29/21 0403 12/29/21 2042 12/30/21 0150 12/30/21 1300 12/31/21 0323 01/01/22 0346  NA 131*   < > 135   < > 136 135 135 137 135  K 5.6*   < > 3.9   < > 4.0 4.1 3.8 3.3* 3.5  CL 93*   < > 97*  --   --   102 102 104 109  CO2 9*   < > 19*  --   --  23 20* 22 23  GLUCOSE 159*   < > 107*  --   --  130* 114* 112* 116*  BUN 28*   < > 40*  --   --  31* 26* 22* 12  CREATININE 2.54*   < > 2.39*  --   --  1.33* 0.97 0.70 0.68  CALCIUM 8.4*   < > 8.1*  --   --  8.0* 7.7* 7.7* 7.2*  MG 2.4  --  1.6*  --   --  2.6*  --   --   --   PHOS  --   --   --   --   --  1.7* 2.4* 1.7* 1.6*   < > = values in this interval not displayed.    Liver Function Tests: Recent Labs  Lab 12/28/21 2040 12/29/21 0323 12/30/21 0150 12/31/21 0323 01/01/22 0346  AST 2,267* 4,155* 4,576* 4,291* 4,151*  ALT 623* 1,238* 1,829* 2,229* 2,449*  ALKPHOS 127* 132* 146* 141* 140*  BILITOT 1.8* 1.8* 2.2* 1.8* 1.4*  PROT 8.2* 8.2* 8.2* 7.3 6.8  ALBUMIN 2.5* 2.3* 2.2* 1.9* 1.9*   No results for input(s): "LIPASE", "AMYLASE" in the last 168 hours. No results  for input(s): "AMMONIA" in the last 168 hours.  CBC: Recent Labs  Lab 12/27/21 1451 12/28/21 0949 12/29/21 0323 12/29/21 0403 12/29/21 2042 12/30/21 0150 12/31/21 0323 01/01/22 0346  WBC 6.2  --  12.3*  --   --  12.3* 13.3* 12.4*  NEUTROABS 4.2  --   --   --   --   --   --   --   HGB 10.6*   < > 10.6* 11.2* 11.6* 10.5* 10.7* 9.5*  HCT 32.2*   < > 31.1* 33.0* 34.0* 32.0* 32.6* 29.1*  MCV 79.5*  --  76.2*  --   --  78.6* 79.5* 79.5*  PLT 369  --  185  --   --  176 154 166   < > = values in this interval not displayed.    Cardiac Enzymes: No results for input(s): "CKTOTAL", "CKMB", "CKMBINDEX", "TROPONINI" in the last 168 hours.  BNP: BNP (last 3 results) Recent Labs    07/24/21 1433 10/11/21 1230 12/27/21 1451  BNP 3,184.1* 400.8* >4,500.0*    ProBNP (last 3 results) No results for input(s): "PROBNP" in the last 8760 hours.    Other results:  Imaging: No results found.   Medications:     Scheduled Medications:  Chlorhexidine Gluconate Cloth  6 each Topical Daily   feeding supplement (PROSource TF)  45 mL Per Tube TID   mouth rinse  15  mL Mouth Rinse Q2H   pantoprazole sodium  40 mg Per Tube Daily   polyethylene glycol  17 g Per Tube Daily   sennosides  5 mL Per Tube BID    Infusions:  sodium chloride 10 mL/hr at 01/01/22 0500   sodium chloride 10 mL/hr at 01/01/22 0500   sodium chloride Stopped (12/30/21 2208)   ceFEPime (MAXIPIME) IV 200 mL/hr at 01/01/22 0531   feeding supplement (VITAL 1.5 CAL) 50 mL/hr at 01/01/22 0500   heparin 2,750 Units/hr (01/01/22 0500)   milrinone 0.125 mcg/kg/min (01/01/22 0500)   norepinephrine (LEVOPHED) Adult infusion 4 mcg/min (01/01/22 0500)   potassium PHOSPHATE IVPB (in mmol)     propofol (DIPRIVAN) infusion 25 mcg/kg/min (01/01/22 0500)   vancomycin Stopped (01/01/22 0301)    PRN Medications: sodium chloride, acetaminophen (TYLENOL) oral liquid 160 mg/5 mL, busPIRone, docusate, fentaNYL (SUBLIMAZE) injection, ibuprofen, ipratropium-albuterol, loperamide HCl, mouth rinse   Assessment/Plan:   1. Mixed cardiogenic/septic shock - echo 12/28/21 LVEF 10% RV sev HK - TEE 7/15 severe biventricular failure. + vegetation on mechanical MVR - Bcx - NGTD - Respiratory culture w/ moderate WBCs (mononuclear and PMN) and no organisms  ID following. Continue vanc/cefepime - CO-OX 62%. On milrinone 0.125+ NE 4 mcg.    2. 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 Repeat echo as above.  - PTA Spironolactone, Farxiga, Digoxin and Losartan currently held in the setting of shock and his AKI.  - Echo this admit LVEF 10% RV severely HK.  - Long-term only option is transplant but not sure he will be candidate - Remains on NE 4 + milrinone 0.125 mcg. CO-OX stable.  - CVP up to 14 today.  Give 40 mg IV lasix x1.    3. 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  -  Underwent MVR with mechanical mitral valve in 05/20[facility] at [facility].  - TEE 7/15 severe biventricular failure. + vegetation on mechanical MVR - Bcx pending. -> on vanc/cefepime - ID following. Appreciate assistance. - Continue heparin   4. Acute hypoxic respiratory failure - intubated 7/15 in setting of shock - CCM managing. Try to diurese with IV lasix.   5. Paroxysmal Atrial Fibrillation - Maintaining SR.  - off amio with liver injury - does not tolerate AF  5. AKI - Due to ATN/shock - Baseline creatinine 0.98 - 1.0.  - Peak 2.6. Resolved.   6. Shock liver - continue supportive care AST remains >4000 ALT >2000  7. Autoimmune Disorder  - He has seronegative spondylitis and pyoderma gangrenosum. Followed by Rheumatology as an outpatient.   8. Hypomagnesemia/hypokalemia - Mag repleted, 2.6 yesterday - K 3.5  this am, supp today    Length of Stay: 4  Amy Clegg NP-C   01/01/2022, 7:04 AM  Advanced Heart Failure Team Pager [phone] (M-F; 7a - 4p)  Please contact [facility] for night-coverage after hours (4p -7a ) and weekends on amion.  Agree with above.  Remains intubated. Awake on vent. Tmax 101. On milrinone 0.125. Off NE. Renal function has normalized. LFTs still very high.   General:  Awake on vent HEENT: normal + ETT Neck: supple. RIJ swan  Carotids 2+ bilat; no bruits. No lymphadenopathy or thryomegaly appreciated. Cor: PMI nondisplaced. Regular rate & rhythm. Mechanical s1. Lungs: clear Abdomen: soft, nontender, nondistended. No hepatosplenomegaly. No bruits or masses. Good bowel sounds. Extremities: no cyanosis, clubbing, rash, edema Neuro: awake on vent moves all 4  Remains very tenuous. Still with fevers. Hemodynamics improved on milrinone. Renal function improved. LFTs still very high.   D/w CCM. Possible extubation today. Will get RUQ u/s. Continue abx.   CRITICAL  CARE Performed by: Arvilla Meres  Total critical care time: 35 minutes  Critical care time was exclusive of separately billable procedures and treating other patients.  Critical care was necessary to treat or prevent imminent or life-threatening deterioration.  Critical care was time spent personally by me (independent of midlevel providers or residents) on the following activities: development of treatment plan with patient and/or surrogate as well as nursing, discussions with consultants, evaluation of patient's response to treatment, examination of patient, obtaining history from patient or surrogate, ordering and performing treatments and interventions, ordering and review of laboratory studies, ordering and review of radiographic studies, pulse oximetry and re-evaluation of patient's condition.  Arvilla Meres, MD  7:55 AM

## 2022-01-01 NOTE — Progress Notes (Signed)
NAME:  Donald [name], MRN:  [id number], DOB:  Dec 23, 1985, LOS: 4 ADMISSION DATE:  12/27/2021, CONSULTATION DATE:  12/28/21 REFERRING MD:  Dr. 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    Vitals and O2 saturations stable on admit with labs significant for subtherapeutic INR 1.4, Na 127, sCr 1.4, bicarb 18, AST 110, Hgb 10.6, trop hs 257> 210, LA 2.3> 1.7, CXR neg, EKG ST with prolonged Qtc.  CT A/P normal.  Given concern for sepsis, cultures sent, started empirically on vanc/ cefepime/ flagyl and given 1L LR bolus.  He was admitted to [facility]PTS.  Overnight and this morning, he developed worsening SOB and tachypnea with saturations stable, BP stable, and given phenergan for vomiting.  Repeat EKG this am showed new STD in lateral leads.  Patient difficult stick, repeat labs pending.  Extremities now cold.  Stat TTE pending.  Cards/ HF consulted.  PCCM consulted for concern of worsening shock despite ok BP and ICU transfer.    Pertinent  Medical History  Biventricular HF, EF 20-25%- followed by Dr. Gala Romney Afib MR s/p MV repair w/ resection of ruptured anterior papillary muscle and reconstruction of papillary chord and placement of annuloplasty ring 2019, but do to recurrent MR, underwent MVR w/ mechanical valve at [facility] 05/2021 Anemia Pyoderma gangrenosum  Significant Hospital Events: Including procedures, antibiotic start and stop dates in addition to other pertinent events   7/14 admitted FPTS for sepsis, N/V. Started on flagyl, cefepime, vanc 7/15 worsening shock> ICU> intubated, pressors, abx. Flagyl stopped. 7/16 decreasing milrinone for high CO and coox   Interim History / Subjective:  [name] denies complaints. He  remains on milrinone and norepi, low dose propofol. CVP 14, PAP 42/28, CO 6.57 (thermodilution), CI 3.16.  Objective   Blood pressure 125/80, pulse 65, temperature 97.9 F (36.6 C), resp. rate 17, height 5\' 10"  (1.778 m), weight 93 kg, SpO2 100 %. PAP: (29-49)/(18-32) 38/22 CVP:  [3 mmHg-19 mmHg] 8 mmHg CO:  [5 L/min-5.6 L/min] 5.4 L/min CI:  [2.4 L/min/m2-2.7 L/min/m2] 2.6 L/min/m2  Vent Mode: PRVC FiO2 (%):  [30 %] 30 % Set Rate:  [15 bmp] 15 bmp Vt Set:  [580 mL] 580 mL PEEP:  [5 cmH20] 5 cmH20 Pressure Support:  [8 cmH20] 8 cmH20 Plateau Pressure:  [18 cmH20-19 cmH20] 18 cmH20   Intake/Output Summary (Last 24 hours) at 01/01/2022 0715 Last data filed at 01/01/2022 0500 Gross per 24 hour  Intake 2994.81 ml  Output 1175 ml  Net 1819.81 ml    Filed Weights   12/30/21 0432 12/31/21 0500 01/01/22 0431  Weight: 89.2 kg 91.5 kg 93 kg    Examination: General: critically ill appearing man lying in bed in NAD HEENT: [place]/AT, eyes anicteric, ETT in place. Neuro: RASS -1, following commands in all extremities, answering questions by nodding. CV: S1-S2, PULM: Breathing comfortably on 5/5.  CTA B. GI: Soft, nontender, nondistended.  Hypoactive bowel sounds. Extremities: No cyanosis, improved pedal edema.  Blood culture, 1 site 7/15: NGTD Blood culture,1 site 7/15: NGTD Blood cultures  7/16> NGTD  BUN 12 Cr 0.68 AST 4151 ALT 2449 Bili 1.4 WBC 12.4 H/H 9.5/29.1 INR 3.7 Coox 63%  Resolved Hospital Problem list  Hyponatremia Hypoglycemia  Assessment & Plan:   Acute respiratory failure with hypoxia requiring MV -LTV V - Daily SAT and SBT-hopeful for extubation today. 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  Daily cooximetry. -Appreciate infectious disease team's antibiotics.  Continue broad-spectrum antibiotics. -Appreciate advanced heart failure team's management. - Using Tylenol sparingly for fevers.  BuSpar for shivering.  Please avoid NSAIDs. -Continue heparin.  Can resume warfarin when LFTs normalize. -Lasix today.  Prolonged Qtc- chronic -Monitor electrolytes, replete as needed.  Avoid QTc prolonging medications.  Hypophosphatemia - Aggressive IV and enteral repletion again today.  Continue to monitor.  MR s/p MVR mechanical valve on coumadin Subtherapeutic INR on admit -Con't heparin, holding warfarin -Trend INR.  Lactic acidosis due to sepsis, improving AKI due to sepsis-improving -Strict I's/O - Continue to monitor - Renally dose medications and avoid nephrotoxic meds.  Elevated liver enzymes due to sepsis, heart failure -Hold hepatotoxic medications.  Continue holding amiodarone. - Right upper quadrant ultrasound - Continue monitoring INR and LFTs.  Paroxysmal AF, currently in NSR -Continue telemetry monitoring. -Monitor electrolytes, replete as needed  Mild hyperglycemia -Monitor, not currently requiring insulin  N/V/D- probably due to sepsis from endocarditis vs heart failure -Need to monitor QTc closely if needing antiemetics  At risk for malnutrition -Continue tube feeding support  Mother and patient updated at bedside during rounds.  Best Practice (right click and "Reselect all SmartList Selections" daily)   Diet/type: tubefeeds DVT prophylaxis: systemic heparin GI prophylaxis: PPI Lines: Central line and Arterial Line, swan Foley:  Yes, and it is still needed Code Status:  full code Last date of multidisciplinary goals of care discussion [per AHF 7/14]   Labs   CBC: Recent Labs  Lab 12/27/21 1451 12/28/21 0949 12/29/21 0323  12/29/21 0403 12/29/21 2042 12/30/21 0150 12/31/21 0323 01/01/22 0346  WBC 6.2  --  12.3*  --   --  12.3* 13.3* 12.4*  NEUTROABS 4.2  --   --   --   --   --   --   --   HGB 10.6*   < > 10.6* 11.2* 11.6* 10.5* 10.7* 9.5*  HCT 32.2*   < > 31.1* 33.0* 34.0* 32.0* 32.6* 29.1*  MCV 79.5*  --  76.2*  --   --  78.6* 79.5* 79.5*  PLT 369  --  185  --   --  176 154 166   < > = values in this interval not displayed.     Basic Metabolic Panel: Recent Labs  Lab 12/28/21 1034 12/28/21 1552 12/29/21 0323 12/29/21 0403 12/29/21 2042 12/30/21 0150 12/30/21 1300 12/31/21 0323 01/01/22 0346  NA 131*   < > 135   < > 136 135 135 137 135  K 5.6*   < > 3.9   < > 4.0 4.1 3.8 3.3* 3.5  CL 93*   < > 97*  --   --  102 102 104 109  CO2 9*   < > 19*  --   --  23 20* 22 23  GLUCOSE 159*   < > 107*  --   --  130* 114* 112* 116*  BUN 28*   < >  40*  --   --  31* 26* 22* 12  CREATININE 2.54*   < > 2.39*  --   --  1.33* 0.97 0.70 0.68  CALCIUM 8.4*   < > 8.1*  --   --  8.0* 7.7* 7.7* 7.2*  MG 2.4  --  1.6*  --   --  2.6*  --   --   --   PHOS  --   --   --   --   --  1.7* 2.4* 1.7* 1.6*   < > = values in this interval not displayed.    GFR: Estimated Creatinine Clearance: 150.5 mL/min (by C-G formula based on SCr of 0.68 mg/dL). Recent Labs  Lab 12/28/21 1034 12/28/21 1430 12/28/21 1632 12/28/21 2040 12/29/21 0323 12/29/21 0603 12/30/21 0150 12/31/21 0323 01/01/22 0346  PROCALCITON 17.21  --   --   --  38.27  --  23.85  --   --   WBC  --   --   --   --  12.3*  --  12.3* 13.3* 12.4*  LATICACIDVEN >9.0*   < > >9.0* 6.5*  --  2.2* 1.8  --   --    < > = values in this interval not displayed.     Liver Function Tests: Recent Labs  Lab 12/28/21 2040 12/29/21 0323 12/30/21 0150 12/31/21 0323 01/01/22 0346  AST 2,267* 4,155* 4,576* 4,291* 4,151*  ALT 623* 1,238* 1,829* 2,229* 2,449*  ALKPHOS 127* 132* 146* 141* 140*  BILITOT 1.8* 1.8* 2.2* 1.8* 1.4*  PROT 8.2* 8.2* 8.2* 7.3 6.8   ALBUMIN 2.5* 2.3* 2.2* 1.9* 1.9*    No results for input(s): "LIPASE", "AMYLASE" in the last 168 hours. No results for input(s): "AMMONIA" in the last 168 hours.  ABG    Component Value Date/Time   PHART 7.414 12/29/2021 2042   PCO2ART 38.0 12/29/2021 2042   PO2ART 125 (H) 12/29/2021 2042   HCO3 24.0 12/29/2021 2042   TCO2 25 12/29/2021 2042   ACIDBASEDEF 11.0 (H) 12/28/2021 1552   O2SAT 62.8 01/01/2022 0344     Coagulation Profile: Recent Labs  Lab 12/28/21 1034 12/29/21 0323 12/30/21 0150 12/31/21 0323 01/01/22 0346  INR 2.2* 3.4* 4.0* 4.0* 3.7*     This patient is critically ill with multiple organ system failure which requires frequent high complexity decision making, assessment, support, evaluation, and titration of therapies. This was completed through the application of advanced monitoring technologies and extensive interpretation of multiple databases. During this encounter critical care time was devoted to patient care services described in this note for 36 minutes.  Steffanie Dunn, DO 01/01/22 8:48 AM [facility]

## 2022-01-01 NOTE — Progress Notes (Signed)
ANTICOAGULATION CONSULT NOTE  Pharmacy Consult for heparin (warfarin on hold) Indication:  MVR /AFib  No Known Allergies  Patient Measurements: Height: 5\' 10"  (177.8 cm) Weight: 93 kg (205 lb 0.4 oz) IBW/kg (Calculated) : 73 Heparin Dosing Weight: 90.7 kg  Vital Signs: Temp: 97.9 F (36.6 C) (07/19 0430) Temp Source: Core (07/19 0400) BP: 125/80 (07/19 0400) Pulse Rate: 65 (07/19 0430)  Labs: Recent Labs    12/30/21 0150 12/30/21 0805 12/30/21 1300 12/30/21 1707 12/31/21 0323 12/31/21 1036 12/31/21 1839 01/01/22 0346  HGB 10.5*  --   --   --  10.7*  --   --  9.5*  HCT 32.0*  --   --   --  32.6*  --   --  29.1*  PLT 176  --   --   --  154  --   --  166  LABPROT 38.3*  --   --   --  38.3*  --   --  36.1*  INR 4.0*  --   --   --  4.0*  --   --  3.7*  HEPARINUNFRC  --    < >  --    < >  --  0.13* 0.21* 0.31  CREATININE 1.33*  --  0.97  --  0.70  --   --  0.68   < > = values in this interval not displayed.     Estimated Creatinine Clearance: 150.5 mL/min (by C-G formula based on SCr of 0.68 mg/dL).   Medical History: Past Medical History:  Diagnosis Date   Anemia    Autoimmune disorder (HCC)    pyoderma gangrenosum   CHF (congestive heart failure) (HCC)    Chronic systolic heart failure (HCC)    a. EF 35-40% by echo in 07/2018 b. EF at 45% by repeat echo in 04/2020   DVT (deep venous thrombosis) (HCC)    h/o   Dysrhythmia    Mitral regurgitation    a. 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 b. 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HCC)    arthritis   Tricuspid regurgitation    Assessment: 9 yom with a history of mitral regurgitation s/p valve replacement on Coumadin, HF, AF and prior DVT, and Hx of hidradenitis and pyoderma  gangrenosum . Patient presenting with weakness.  Patient taking warfarin pta. Home dose is 4mg  daily per last [facility] note. INR 1.4 on 7/10 instructed to take 6mg  that night and then continue 4mg  daily. Last taken 7/14.  INR continues to be elevated at 3.7, but likely reflective of hepatic dysfunction with AST4000/ALT2000, not true anticoagulation.    Heparin level now at goal 0.43 on heparin rate of  2750 uts/hr. No bleeding noted h/h stable  SCr normalized now.   Goal of Therapy:  Heparin level: 0.3-0.7 units/mL (targeting low end of goal range) Monitor platelets by anticoagulation protocol: Yes   Plan:  Reduce heparin 2700 units/hr Recheck in am  9/10 PharmD., BCPS Clinical Pharmacist 01/01/2022 7:10 AM

## 2022-01-02 DIAGNOSIS — K529 Noninfective gastroenteritis and colitis, unspecified: Secondary | ICD-10-CM | POA: Diagnosis not present

## 2022-01-02 DIAGNOSIS — G9340 Encephalopathy, unspecified: Secondary | ICD-10-CM | POA: Diagnosis not present

## 2022-01-02 DIAGNOSIS — I339 Acute and subacute endocarditis, unspecified: Secondary | ICD-10-CM

## 2022-01-02 DIAGNOSIS — R570 Cardiogenic shock: Secondary | ICD-10-CM | POA: Diagnosis not present

## 2022-01-02 DIAGNOSIS — T826XXA Infection and inflammatory reaction due to cardiac valve prosthesis, initial encounter: Secondary | ICD-10-CM | POA: Insufficient documentation

## 2022-01-02 DIAGNOSIS — I5023 Acute on chronic systolic (congestive) heart failure: Secondary | ICD-10-CM | POA: Diagnosis not present

## 2022-01-02 DIAGNOSIS — I5082 Biventricular heart failure: Secondary | ICD-10-CM | POA: Diagnosis not present

## 2022-01-02 DIAGNOSIS — N179 Acute kidney failure, unspecified: Secondary | ICD-10-CM

## 2022-01-02 LAB — GLUCOSE, CAPILLARY
Glucose-Capillary: 132 mg/dL — ABNORMAL HIGH (ref 70–99)
Glucose-Capillary: 139 mg/dL — ABNORMAL HIGH (ref 70–99)
Glucose-Capillary: 113 mg/dL — ABNORMAL HIGH (ref 70–99)
Glucose-Capillary: 110 mg/dL — ABNORMAL HIGH (ref 70–99)
Glucose-Capillary: 122 mg/dL — ABNORMAL HIGH (ref 70–99)

## 2022-01-02 LAB — PROTIME-INR
INR: 1.8 — ABNORMAL HIGH (ref 0.8–1.2)
Prothrombin Time: 20.6 seconds — ABNORMAL HIGH (ref 11.4–15.2)

## 2022-01-02 LAB — CULTURE, BLOOD (ROUTINE X 2)
Culture: NO GROWTH
Culture: NO GROWTH
Special Requests: ADEQUATE
Special Requests: ADEQUATE

## 2022-01-02 LAB — HEPATIC FUNCTION PANEL
ALT: 1680 U/L — ABNORMAL HIGH (ref 0–44)
AST: 2467 U/L — ABNORMAL HIGH (ref 15–41)
Albumin: 2 g/dL — ABNORMAL LOW (ref 3.5–5.0)
Alkaline Phosphatase: 133 U/L — ABNORMAL HIGH (ref 38–126)
Bilirubin, Direct: 0.5 mg/dL — ABNORMAL HIGH (ref 0.0–0.2)
Indirect Bilirubin: 0.6 mg/dL (ref 0.3–0.9)
Total Bilirubin: 1.1 mg/dL (ref 0.3–1.2)
Total Protein: 7.5 g/dL (ref 6.5–8.1)

## 2022-01-02 LAB — BASIC METABOLIC PANEL
Anion gap: 8 (ref 5–15)
BUN: 13 mg/dL (ref 6–20)
CO2: 23 mmol/L (ref 22–32)
Calcium: 7.7 mg/dL — ABNORMAL LOW (ref 8.9–10.3)
Chloride: 104 mmol/L (ref 98–111)
Creatinine, Ser: 0.74 mg/dL (ref 0.61–1.24)
GFR, Estimated: >60 mL/min (ref >60)
Glucose, Bld: 125 mg/dL — ABNORMAL HIGH (ref 70–99)
Potassium: 3.9 mmol/L (ref 3.5–5.1)
Sodium: 135 mmol/L (ref 135–145)

## 2022-01-02 LAB — COOXEMETRY PANEL
Carboxyhemoglobin: 1.9 % — ABNORMAL HIGH (ref 0.5–1.5)
Carboxyhemoglobin: 1.8 % — ABNORMAL HIGH (ref 0.5–1.5)
Methemoglobin: 1.1 % (ref 0.0–1.5)
Methemoglobin: <0.7 % (ref 0.0–1.5)
O2 Saturation: 99.5 %
O2 Saturation: 55.4 %
Total hemoglobin: 10.2 g/dL — ABNORMAL LOW (ref 12.0–16.0)
Total hemoglobin: 10.4 g/dL — ABNORMAL LOW (ref 12.0–16.0)

## 2022-01-02 LAB — CBC
HCT: 29.5 % — ABNORMAL LOW (ref 39.0–52.0)
Hemoglobin: 9.6 g/dL — ABNORMAL LOW (ref 13.0–17.0)
MCH: 25.9 pg — ABNORMAL LOW (ref 26.0–34.0)
MCHC: 32.5 g/dL (ref 30.0–36.0)
MCV: 79.7 fL — ABNORMAL LOW (ref 80.0–100.0)
Platelets: 200 10*3/uL (ref 150–400)
RBC: 3.7 MIL/uL — ABNORMAL LOW (ref 4.22–5.81)
RDW: 16 % — ABNORMAL HIGH (ref 11.5–15.5)
WBC: 12 10*3/uL — ABNORMAL HIGH (ref 4.0–10.5)
nRBC: 0.2 % (ref 0.0–0.2)

## 2022-01-02 LAB — PHOSPHORUS: Phosphorus: 2.7 mg/dL (ref 2.5–4.6)

## 2022-01-02 LAB — HEPARIN LEVEL (UNFRACTIONATED)
Heparin Unfractionated: 0.83 IU/mL — ABNORMAL HIGH (ref 0.30–0.70)
Heparin Unfractionated: 0.73 IU/mL — ABNORMAL HIGH (ref 0.30–0.70)

## 2022-01-02 MED ORDER — FUROSEMIDE 10 MG/ML IJ SOLN
40.0000 mg | Freq: Once | INTRAMUSCULAR | Status: AC
  Administered 2022-01-02: 40 mg via INTRAVENOUS
  Filled 2022-01-02: qty 4

## 2022-01-02 MED ORDER — OXYCODONE HCL 5 MG/5ML PO SOLN: 2.5000 mg | ORAL | Status: DC | PRN

## 2022-01-02 MED ORDER — POTASSIUM CHLORIDE 20 MEQ PO PACK
40.0000 meq | PACK | Freq: Once | ORAL | Status: AC
Start: 2022-01-02 — End: 2022-01-02
  Administered 2022-01-02: 40 meq via ORAL
  Filled 2022-01-02: qty 2

## 2022-01-02 MED ORDER — ORAL CARE MOUTH RINSE
15.0000 mL | OROMUCOSAL | Status: DC
Start: 2022-01-02 — End: 2022-01-06
  Administered 2022-01-02 – 2022-01-06 (×17): 15 mL via OROMUCOSAL

## 2022-01-02 MED ORDER — ORAL CARE MOUTH RINSE: 15.0000 mL | OROMUCOSAL | Status: DC | PRN

## 2022-01-02 NOTE — Progress Notes (Signed)
SLP Cancellation Note  Patient Details Name: RAPHEAL MASSO MRN: [id number] DOB: April 17, 1986   Cancelled treatment:       Reason Eval/Treat Not Completed: Patient's level of consciousness. Pts cognition not yet appropriate for swallow eval per RN. Will f/u tomorrow.    Dorita Rowlands, Riley Nearing 01/02/2022, 12:40 PM

## 2022-01-02 NOTE — Evaluation (Signed)
Physical Therapy Evaluation Patient Details Name: Donald [name] MRN: [id number] DOB: 01-08-1986 Today's Date: 01/02/2022  History of Present Illness  36 yo male admitted 7/14 with N/V/D and sepsis. Pt with prosthetic mitral valve endocarditis. 7/15-7/19 VDRF. PMhx: MR s/p MVR 12/22, biventricular HF with EF 20-25%, PAF, pyoderma gangrenosum  Clinical Impression  Pt with flat affect, decreased attention and decreased initiation. Pt normally independent living with parents and has 34yo son Jolyn Nap). Pt currently with significant cognitive deficits limiting ability to follow commands and participate in mobility. Pt with decreased strength, balance and function who will benefit from acute therapy to maximize mobility, safety and function to decrease burden of care.   98% on RA HR 80       Recommendations for follow up therapy are one component of a multi-disciplinary discharge planning process, led by the attending physician.  Recommendations may be updated based on patient status, additional functional criteria and insurance authorization.  Follow Up Recommendations Acute inpatient rehab (3hours/day)      Assistance Recommended at Discharge Frequent or constant Supervision/Assistance  Patient can return home with the following  A lot of help with walking and/or transfers;A lot of help with bathing/dressing/bathroom;Assistance with feeding;Assistance with cooking/housework;Direct supervision/assist for financial management;Assist for transportation;Direct supervision/assist for medications management;Help with stairs or ramp for entrance    Equipment Recommendations None recommended by PT (TBD with progression)  Recommendations for Other Services       Functional Status Assessment Patient has had a recent decline in their functional status and demonstrates the ability to make significant improvements in function in a reasonable and predictable amount of time.     Precautions /  Restrictions Precautions Precautions: Fall;Other (comment) Precaution Comments: cortrak, sacral wounds      Mobility  Bed Mobility Overal bed mobility: Needs Assistance Bed Mobility: Supine to Sit     Supine to sit: Min assist, HOB elevated     General bed mobility comments: HOB 30 degrees, physical assist to bend knee and roll to left with assist to lift trunk to surface. Pt participating with tactile facilitation    Transfers Overall transfer level: Needs assistance   Transfers: Sit to/from Stand, Bed to chair/wheelchair/BSC Sit to Stand: Min assist Stand pivot transfers: Min assist         General transfer comment: min assist to rise from bed and from chair, min assist with single UE support on therapist to pivot from bed to chair. Max multimodal cues as pt freezes mid transfer x 3 during pivot with facilitation and cues to complete transfer    Ambulation/Gait               General Gait Details: not yet able  Stairs            Wheelchair Mobility    Modified Rankin (Stroke Patients Only)       Balance Overall balance assessment: Needs assistance Sitting-balance support: No upper extremity supported, Feet supported Sitting balance-Leahy Scale: Fair Sitting balance - Comments: supervision for sitting   Standing balance support: Single extremity supported Standing balance-Leahy Scale: Poor Standing balance comment: single UE support in standing                             Pertinent Vitals/Pain Pain Assessment Pain Assessment: CPOT Facial Expression: Grimacing Body Movements: Protection Muscle Tension: Relaxed Compliance with ventilator (intubated pts.): N/A Vocalization (extubated pts.): Talking in normal tone or no sound CPOT Total: 3  Pain Intervention(s): Repositioned, Monitored during session, Limited activity within patient's tolerance    Home Living Family/patient expects to be discharged to:: Private residence Living  Arrangements: Parent Available Help at Discharge: Family;Available 24 hours/day Type of Home: House Home Access: Stairs to enter Entrance Stairs-Rails: Left Entrance Stairs-Number of Steps: 4   Home Layout: One level Home Equipment: Grab bars - tub/shower;Shower seat      Prior Function Prior Level of Function : Independent/Modified Independent             Mobility Comments: independent, 34 yo son he was teaching to play t-ball ADLs Comments: independent     Hand Dominance        Extremity/Trunk Assessment   Upper Extremity Assessment Upper Extremity Assessment: Generalized weakness    Lower Extremity Assessment Lower Extremity Assessment: Generalized weakness    Cervical / Trunk Assessment Cervical / Trunk Assessment: Normal  Communication   Communication: Other (comment) (very limited responses)  Cognition Arousal/Alertness: Lethargic Behavior During Therapy: Flat affect Overall Cognitive Status: Impaired/Different from baseline Area of Impairment: Orientation, Attention, Memory, Following commands, Safety/judgement, Problem solving                 Orientation Level: Disoriented to, Time, Place, Situation Current Attention Level: Focused Memory: Decreased short-term memory Following Commands: Follows one step commands inconsistently, Follows one step commands with increased time Safety/Judgement: Decreased awareness of safety, Decreased awareness of deficits   Problem Solving: Requires verbal cues, Requires tactile cues General Comments: pt with grossly 3 sec attention span. Able to state his name, son's name and relation to mom in room. Oriented to year only. pt requires multimodal cues and physical assist to initiate movement, will not initiate with verbal or visual cues alone. per mom was having some decreased processing PTA but significantly different than baseline        General Comments      Exercises     Assessment/Plan    PT Assessment  Patient needs continued PT services  PT Problem List Decreased strength;Decreased mobility;Decreased activity tolerance;Decreased balance;Decreased cognition       PT Treatment Interventions Gait training;Balance training;DME instruction;Therapeutic exercise;Functional mobility training;Cognitive remediation;Therapeutic activities;Patient/family education;Neuromuscular re-education    PT Goals (Current goals can be found in the Care Plan section)  Acute Rehab PT Goals Patient Stated Goal: return home PT Goal Formulation: With family Time For Goal Achievement: 01/16/22 Potential to Achieve Goals: Fair    Frequency Min 3X/week     Co-evaluation               AM-PAC PT "6 Clicks" Mobility  Outcome Measure Help needed turning from your back to your side while in a flat bed without using bedrails?: A Little Help needed moving from lying on your back to sitting on the side of a flat bed without using bedrails?: A Little Help needed moving to and from a bed to a chair (including a wheelchair)?: Total Help needed standing up from a chair using your arms (e.g., wheelchair or bedside chair)?: A Lot Help needed to walk in hospital room?: Total Help needed climbing 3-5 steps with a railing? : Total 6 Click Score: 11    End of Session   Activity Tolerance: Patient tolerated treatment well Patient left: in chair;with call bell/phone within reach;with family/visitor present Nurse Communication: Mobility status PT Visit Diagnosis: Other abnormalities of gait and mobility (R26.89);Difficulty in walking, not elsewhere classified (R26.2);Muscle weakness (generalized) (M62.81)    Time: 5284-1324 PT Time Calculation (min) (ACUTE ONLY):  27 min   Charges:   PT Evaluation $PT Eval Moderate Complexity: 1 Mod PT Treatments $Therapeutic Activity: 8-22 mins        [name], PT Acute Rehabilitation Services Office: [phone]   Enedina Finner Suann Klier 01/02/2022, 12:14 PM

## 2022-01-02 NOTE — Progress Notes (Signed)
eLink Physician-Brief Progress Note Patient Name: EDRAS WILFORD DOB: [birth year] MRN: [id number]   Date of Service  01/02/2022  HPI/Events of Note  CVP high 14 PA 36/27 + 1800 this shift , off levo  eICU Interventions  Lasix 40 x 1     Intervention Category Intermediate Interventions: Hypervolemia - evaluation and management  Zaccai Chavarin V. Anastazja Isaac 01/02/2022, 4:21 AM

## 2022-01-02 NOTE — Progress Notes (Addendum)
Advanced Heart Failure Rounding Note   Subjective:   7/19 Extubated. Diuresed with IV lasix. I/O not accurate. Norepi weaned off.   ID following. On vanc/cefepime. NGTD from [facility] 07/15 and 07/16. Ongoing fever.    Lactate has cleared. LFTS have peaked. Renal function back to baseline.  TEE + vegetation on MV  CO-OX 99 repeat now.   Swan #s on milrinone 0.125. CO-OX pending.  CVP 5-6 PAP 37/24   Confused overnight. Follows commands.    Objective:     Vital Signs:   Temp:  [98.5 F (36.9 C)-101.8 F (38.8 C)] 99.7 F (37.6 C) (07/20 0615) Pulse Rate:  [78-97] 88 (07/20 0615) Resp:  [15-45] 21 (07/20 0615) BP: (112-114)/(80-85) 112/80 (07/20 0000) SpO2:  [91 %-100 %] 96 % (07/20 0615) Arterial Line BP: (97-130)/(65-84) 107/72 (07/20 0615) FiO2 (%):  [30 %] 30 % (07/19 0740) Weight:  [93.8 kg] 93.8 kg (07/20 0500) Last BM Date : 01/01/22  Weight change: Filed Weights   12/31/21 0500 01/01/22 0431 01/02/22 0500  Weight: 91.5 kg 93 kg 93.8 kg    Intake/Output:   Intake/Output Summary (Last 24 hours) at 01/02/2022 0704 Last data filed at 01/02/2022 0617 Gross per 24 hour  Intake 3786.89 ml  Output 3506 ml  Net 280.89 ml  CVP 6-7 General: Appears weak.  No resp difficulty HEENT: normal Neck: supple. JVP 6-7 . Carotids 2+ bilat; no bruits. No lymphadenopathy or thryomegaly appreciated. RIJ swan .  Cor: PMI nondisplaced. Regular rate & rhythm. No rubs, gallops or murmurs. Lsubclavian CVC Lungs: Rhonchi throughout. On 2 liters.  Abdomen: soft, nontender, nondistended. No hepatosplenomegaly. No bruits or masses. Good bowel sounds. Extremities: no cyanosis, clubbing, rash, edema Neuro: Awake. MAE x4.    Telemetry: SR 70-90s   Labs: Basic Metabolic Panel: Recent Labs  Lab 12/28/21 1034 12/28/21 1552 12/29/21 0323 12/29/21 0403 12/30/21 0150 12/30/21 1300 12/31/21 0323 01/01/22 0346 01/01/22 1200 01/01/22 2018 01/01/22 2227  NA 131*   < > 135   < >  135 135 137 135 142 138 139  K 5.6*   < > 3.9   < > 4.1 3.8 3.3* 3.5 4.1 3.8 3.7  CL 93*   < > 97*  --  102 102 104 109  --  105  --   CO2 9*   < > 19*  --  23 20* 22 23  --  23  --   GLUCOSE 159*   < > 107*  --  130* 114* 112* 116*  --  119*  --   BUN 28*   < > 40*  --  31* 26* 22* 12  --  10  --   CREATININE 2.54*   < > 2.39*  --  1.33* 0.97 0.70 0.68  --  0.63  --   CALCIUM 8.4*   < > 8.1*  --  8.0* 7.7* 7.7* 7.2*  --  7.8*  --   MG 2.4  --  1.6*  --  2.6*  --   --   --   --  1.5*  --   PHOS  --   --   --   --  1.7* 2.4* 1.7* 1.6*  --  1.7*  --    < > = values in this interval not displayed.    Liver Function Tests: Recent Labs  Lab 12/28/21 2040 12/29/21 0323 12/30/21 0150 12/31/21 0323 01/01/22 0346  AST 2,267* 4,155* 4,576* 4,291* 4,151*  ALT 623*  1,238* 1,829* 2,229* 2,449*  ALKPHOS 127* 132* 146* 141* 140*  BILITOT 1.8* 1.8* 2.2* 1.8* 1.4*  PROT 8.2* 8.2* 8.2* 7.3 6.8  ALBUMIN 2.5* 2.3* 2.2* 1.9* 1.9*   No results for input(s): "LIPASE", "AMYLASE" in the last 168 hours. Recent Labs  Lab 01/01/22 0930  AMMONIA 24    CBC: Recent Labs  Lab 12/27/21 1451 12/28/21 0949 12/30/21 0150 12/31/21 0323 01/01/22 0346 01/01/22 1200 01/01/22 1230 01/01/22 2227 01/02/22 0501  WBC 6.2   < > 12.3* 13.3* 12.4*  --  11.8*  --  12.0*  NEUTROABS 4.2  --   --   --   --   --   --   --   --   HGB 10.6*   < > 10.5* 10.7* 9.5* 10.9* 10.1* 10.9* 9.6*  HCT 32.2*   < > 32.0* 32.6* 29.1* 32.0* 30.6* 32.0* 29.5*  MCV 79.5*   < > 78.6* 79.5* 79.5*  --  77.9*  --  79.7*  PLT 369   < > 176 154 166  --  194  --  200   < > = values in this interval not displayed.    Cardiac Enzymes: No results for input(s): "CKTOTAL", "CKMB", "CKMBINDEX", "TROPONINI" in the last 168 hours.  BNP: BNP (last 3 results) Recent Labs    07/24/21 1433 10/11/21 1230 12/27/21 1451  BNP 3,184.1* 400.8* >4,500.0*    ProBNP (last 3 results) No results for input(s): "PROBNP" in the last 8760  hours.    Other results:  Imaging: DG Abd Portable 1V  Result Date: 01/01/2022 CLINICAL DATA:  Feeding tube placement. EXAM: PORTABLE ABDOMEN - 1 VIEW COMPARISON:  Chest and abdominal radiographs 12/28/2021. CT 12/27/2021. FINDINGS: Single AP semi erect view centered on the lower chest at 1130 hours. Feeding tube projects below the diaphragm with tip in the right upper quadrant, likely in the distal stomach or proximal duodenum. The visualized bowel gas pattern is normal. Right IJ Swan-Ganz catheter projects into the inter lobar right pulmonary artery, unchanged. There is a left subclavian central venous catheter projecting to the mid SVC level, unchanged. The heart remains moderately enlarged. IMPRESSION: Feeding tube projects to the level of the distal stomach or proximal duodenum. Unchanged position of Swan-Ganz central venous catheters. Electronically Signed   By: Carey Bullocks M.D.   On: 01/01/2022 11:38     Medications:     Scheduled Medications:  Chlorhexidine Gluconate Cloth  6 each Topical Daily   feeding supplement (PROSource TF)  45 mL Per Tube TID   oxymetazoline  1 spray Each Nare BID    Infusions:  sodium chloride 10 mL/hr at 01/02/22 0600   sodium chloride 10 mL/hr at 01/02/22 0543   sodium chloride Stopped (12/30/21 2208)   ceFEPime (MAXIPIME) IV 200 mL/hr at 01/02/22 0600   feeding supplement (VITAL 1.5 CAL) 30 mL/hr at 01/02/22 0600   heparin 2,400 Units/hr (01/02/22 0700)   milrinone 0.125 mcg/kg/min (01/02/22 0600)   norepinephrine (LEVOPHED) Adult infusion Stopped (01/02/22 0556)   potassium PHOSPHATE IVPB (in mmol) 85 mL/hr at 01/02/22 0600   vancomycin Stopped (01/02/22 0205)    PRN Medications: sodium chloride, acetaminophen (TYLENOL) oral liquid 160 mg/5 mL, busPIRone, fentaNYL (SUBLIMAZE) injection, ipratropium-albuterol, loperamide HCl, oxyCODONE   Assessment/Plan:   1. Mixed cardiogenic/septic shock - echo 12/28/21 LVEF 10% RV sev HK - TEE 7/15  severe biventricular failure. + vegetation on mechanical MVR - Bcx - NGTD - Respiratory culture w/ moderate WBCs (mononuclear and PMN)  and no organisms  ID following. Continue vanc/cefepime. Remains febrile. Consider removing swan today.   On milrinone 0.125 . WBC 12.   2. 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 Repeat echo as above.  - PTA Spironolactone, Farxiga, Digoxin and Losartan currently held in the setting of shock and his AKI.  - Echo this admit LVEF 10% RV severely HK.  - Long-term only option is transplant but not sure he will be candidate - Off NE. CO-O X 99%. Repeat now. Continue milrinone 0.125 mcg.  - Remains on NE 4 + milrinone 0.125 mcg. CO-OX stable.    3. 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  - Underwent MVR with [facility] mitral valve in [facility].  - TEE 7/15 severe biventricular failure. + vegetation on mechanical MVR - Bcx pending. -> on vanc/cefepime - ID following. Appreciate assistance. - Continue heparin. INR 1.8    4. Acute hypoxic respiratory failure - intubated 7/15 in setting of shock - Extubated 719  - CCM managing.   5. Paroxysmal Atrial Fibrillation -On SR  - off amio with liver injury - does not tolerate AF  5. AKI - Due to ATN/shock - Baseline creatinine 0.98 - 1.0.  - Peak 2.6. Resolved.   6. Shock liver - continue supportive care AST /ALT continue trend down.   7. Autoimmune Disorder  - He has seronegative spondylitis and pyoderma gangrenosum. Followed by Rheumatology as an outpatient.   8. Hypomagnesemia/hypokalemia - Mag repleted, 2.6 yesterday - K 3.9    Consult PT.   Length of Stay: 5  Amy Clegg NP-C    01/02/2022, 7:04 AM  Advanced Heart Failure Team Pager [phone] (M-F; 7a - 4p)  Please contact [facility] for night-coverage after hours (4p -7a ) and weekends on amion.  Agree with above.   Extubated yesterday. On milrinone 0.125. Agitated and confused overnight. More alert today.  NE weaned off this am.   LFTs improving. CVP 3   General:  Lying in bed No resp difficulty HEENT: normal Neck: supple. + RIJ swan Carotids 2+ bilat; no bruits. No lymphadenopathy or thryomegaly appreciated. Cor: PMI nondisplaced. Regular rate & rhythm. Mech s1 Lungs: clear Abdomen: soft, nontender, nondistended. No hepatosplenomegaly. No bruits or masses. Good bowel sounds. Extremities: no cyanosis, clubbing, rash, edema Neuro: alert, cranial nerves grossly intact. moves all 4 extremities w/o difficulty. Affect pleasant  Remains tenuous. Still with occasional fevers. CO and BP marginal. Will restart NE. Can pull swan. Continue IV abx.   CRITICAL CARE Performed by: Arvilla Meres  Total critical care time: 35 minutes  Critical care time was exclusive of separately billable procedures and treating other patients.  Critical care was necessary to treat or prevent imminent or life-threatening deterioration.  Critical care was time spent personally by me (independent of midlevel providers or residents) on the following activities: development of treatment plan with patient and/or surrogate as well as nursing, discussions with consultants, evaluation of patient's response to treatment, examination of patient, obtaining history from patient or surrogate, ordering and performing treatments and interventions, ordering and review of laboratory studies, ordering and review of radiographic studies, pulse oximetry and re-evaluation of patient's condition.  Arvilla Meres, MD  9:17 AM

## 2022-01-02 NOTE — Progress Notes (Signed)
ANTICOAGULATION CONSULT NOTE  Pharmacy Consult for heparin (warfarin on hold) Indication:  MVR /AFib  No Known Allergies  Patient Measurements: Height: 5\' 10"  (177.8 cm) Weight: 93.8 kg (206 lb 12.7 oz) IBW/kg (Calculated) : 73 Heparin Dosing Weight: 90.7 kg  Vital Signs: Temp: 98.8 F (37.1 C) (07/20 1115) Temp Source: Core (07/20 0800) BP: 96/78 (07/20 0800) Pulse Rate: 81 (07/20 1215)  Labs: Recent Labs    12/31/21 0323 12/31/21 1036 01/01/22 0346 01/01/22 1200 01/01/22 1230 01/01/22 2018 01/01/22 2227 01/02/22 0501 01/02/22 1304  HGB 10.7*  --  9.5*   < > 10.1*  --  10.9* 9.6*  --   HCT 32.6*  --  29.1*   < > 30.6*  --  32.0* 29.5*  --   PLT 154  --  166  --  194  --   --  200  --   LABPROT 38.3*  --  36.1*  --   --   --   --  20.6*  --   INR 4.0*  --  3.7*  --   --   --   --  1.8*  --   HEPARINUNFRC  --    < > 0.31  --  0.43  --   --  0.83* 0.73*  CREATININE 0.70  --  0.68  --   --  0.63  --  0.74  --    < > = values in this interval not displayed.    Estimated Creatinine Clearance: 151 mL/min (by C-G formula based on SCr of 0.74 mg/dL).   Medical History: Past Medical History:  Diagnosis Date   Anemia    Autoimmune disorder (HCC)    pyoderma gangrenosum   CHF (congestive heart failure) (HCC)    Chronic systolic heart failure (HCC)    a. EF 35-40% by echo in 07/2018 b. EF at 45% by repeat echo in 04/2020   DVT (deep venous thrombosis) (HCC)    h/o   Dysrhythmia    Mitral regurgitation    a. 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 b. 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HCC)    arthritis   Tricuspid regurgitation    Assessment: 23 yom with a history of mitral regurgitation s/p valve replacement on Coumadin, HF, AF and  prior DVT, and Hx of hidradenitis and pyoderma gangrenosum . Patient presenting with weakness.  Patient taking warfarin pta. Home dose is 4mg  daily per last [facility] note. INR 1.4 on 7/10 instructed to take 6mg  that night and then continue 4mg  daily. Last taken 7/14.  INR continues to be elevated at 3.7, but likely reflective of hepatic dysfunction with AST4000/ALT2000, not true anticoagulation.    Heparin level now above goal at 0.73 on heparin rate of 2400 units/hr adjust down overnight. No bleeding noted h/h stable.   SCr normalized now.   Goal of Therapy:  Heparin level: 0.3-0.7 units/mL (targeting low end of goal range) Monitor platelets by anticoagulation protocol: Yes   Plan:  Reduce heparin to 2300 units/hr Recheck in am  9/10 PharmD., BCPS Clinical Pharmacist 01/02/2022 1:30 PM

## 2022-01-02 NOTE — Progress Notes (Signed)
OT Cancellation Note  Patient Details Name: FOUNT BAHE MRN: [id number] DOB: 1985/07/14   Cancelled Treatment:    Reason Eval/Treat Not Completed: Fatigue/lethargy limiting ability to participate.  OT to continue efforts.    [place] Katich D Brylie Sneath 01/02/2022, 3:03 PM 01/02/2022  RP, OTR/L  Acute Rehabilitation Services  Office:  [phone]

## 2022-01-02 NOTE — Progress Notes (Signed)
NAME:  Donald [name], MRN:  [id number], DOB:  04-10-1986, LOS: 5 ADMISSION DATE:  12/27/2021, CONSULTATION DATE:  12/28/21 REFERRING MD:  Dr. 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    Vitals and O2 saturations stable on admit with labs significant for subtherapeutic INR 1.4, Na 127, sCr 1.4, bicarb 18, AST 110, Hgb 10.6, trop hs 257> 210, LA 2.3> 1.7, CXR neg, EKG ST with prolonged Qtc.  CT A/P normal.  Given concern for sepsis, cultures sent, started empirically on vanc/ cefepime/ flagyl and given 1L LR bolus.  He was admitted to FPTS.  Overnight and this morning, he developed worsening SOB and tachypnea with saturations stable, BP stable, and given phenergan for vomiting.  Repeat EKG this am showed new STD in lateral leads.  Patient difficult stick, repeat labs pending.  Extremities now cold.  Stat TTE pending.  Cards/ HF consulted.  PCCM consulted for concern of worsening shock despite ok BP and ICU transfer.    Pertinent  Medical History  Biventricular HF, EF 20-25%- followed by Dr. Gala Romney Afib MR s/p MV repair w/ resection of ruptured anterior papillary muscle and reconstruction of papillary chord and placement of annuloplasty ring 2019, but do to recurrent MR, underwent MVR w/ mechanical valve at [facility] 05/2021 Anemia Pyoderma gangrenosum  Significant Hospital Events: Including procedures, antibiotic start and stop dates in addition to other pertinent events   7/14 admitted FPTS for sepsis, N/V. Started on flagyl, cefepime, vanc 7/15 worsening shock> ICU> intubated, pressors, abx. Flagyl stopped. 7/16 decreasing milrinone for high CO and co-ox 7/19 extubated   Interim History / Subjective:  Some confusion and  agitation overnight. Improved this morning.  No complaints this morning. Extubated yesterday and tolerating well.  3.5L urine out yesterday. Net positive 256mL/24hrs Co-ox 55, CVP 2 Tmax 101.8 Milrinone 0.125, norepi , heparin infusion  Objective   Blood pressure 112/80, pulse 88, temperature 99.7 F (37.6 C), resp. rate (!) 21, height 5\' 10"  (1.778 m), weight 93.8 kg, SpO2 96 %. PAP: (34-52)/(21-35) 34/28 CVP:  [4 mmHg-19 mmHg] 6 mmHg CO:  [5.1 L/min-5.7 L/min] 5.5 L/min CI:  [2.5 L/min/m2-2.7 L/min/m2] 2.6 L/min/m2      Intake/Output Summary (Last 24 hours) at 01/02/2022 0849 Last data filed at 01/02/2022 01/04/2022 Gross per 24 hour  Intake 3626.89 ml  Output 3641 ml  Net -14.11 ml    Filed Weights   12/31/21 0500 01/01/22 0431 01/02/22 0500  Weight: 91.5 kg 93 kg 93.8 kg    Examination:  General: Adult male of normal body habitus in NAD HEENT: [place]/AT, PERRL, MMM Neuro: Somnelent, but easily arouses to follow commands and communicate with staff. Oriented.  PULM: Clear bilateral breath sounds GI: Soft, NT, ND. Hypoactive.  Extremities: normal bulk and tone, no edema.    Blood culture, 1 site 7/15: negative Blood culture,1 site 7/15: negative Blood cultures  7/16> NGTD  BUN 13 Cr 0.74 AST 4151 > 2467 ALT 2449 > 1680 Bili 1.1 WBC 12 H/H 9.6/29.5 INR 1.8 Coox 55%  Resolved Hospital Problem list   Hyponatremia Hypoglycemia  Assessment & Plan:   Acute respiratory failure with hypoxia requiring MV - Extubated 7/19 tolerating well - Supplemental O2 as needed  to keep O2 sats > 92%  Shock, multifactorial septic & cardiogenic MV endocarditis, mechanical valve> bacteremia that would have been present on admission may not have been captured with so few blood cultures being obtained Biventricular HFrEF, acute on chronic  History of mitral valve replacement, now with acute prosthetic mitral valve endocarditis - Continue milrinone and norepinephrine. Daily Co-ox -  Antimicrobial regimen per ID - Appreciate advanced heart failure team's management. - Using Tylenol sparingly for fevers.  BuSpar for shivering.  Please avoid NSAIDs. - Heparin infusion. Transition back to warfarin once liver function normalizes.  - Lasix 40 given at 0500, will give 40 meq K  Prolonged Qtc- chronic -Monitor electrolytes, replete as needed.  Avoid QTc prolonging medications.  AKI due to sepsis/shock - improved Hypophosphatemia Hypokalemia - Strict I's/O - Continue to monitor - Renally dose medications and avoid nephrotoxic meds. - phos wnl this morning. Continue to monitor daily.  - Give K  MR s/p MVR mechanical valve on coumadin Subtherapeutic INR on admit -Continue heparin, holding warfarin -Trend INR.  Elevated liver enzymes due to sepsis, heart failure - Hold hepatotoxic medications.  Continue holding amiodarone. - Right upper quadrant ultrasound - Continue monitoring INR and LFTs.  Paroxysmal AF, currently in NSR -Continue telemetry monitoring. -Monitor electrolytes, replete as needed  Mild hyperglycemia -Monitor, not currently requiring insulin  N/V/D- probably due to sepsis from endocarditis vs heart failure -Need to monitor QTc closely if needing antiemetics  At risk for malnutrition -Continue tube feeding support  Mother and patient updated at bedside during rounds.  Best Practice (right click and "Reselect all SmartList Selections" daily)   Diet/type: tubefeeds DVT prophylaxis: systemic heparin GI prophylaxis: PPI Lines: Central line and Arterial Line, swan to be removed.  Foley:  Yes, and it is still needed Code Status:  full code Last date of multidisciplinary goals of care discussion [per AHF 7/14]  Critical care time: 48 minutes   [name], AGACNP-BC [facility]  See Amion for personal pager PCCM on call pager [phone] until 7pm. Please call Elink 7p-7a. [phone]  01/02/2022 8:51  AM

## 2022-01-02 NOTE — Progress Notes (Signed)
ANTICOAGULATION CONSULT NOTE  Pharmacy Consult for heparin (warfarin on hold) Indication:  MVR /AFib  No Known Allergies  Patient Measurements: Height: 5\' 10"  (177.8 cm) Weight: 93.8 kg (206 lb 12.7 oz) IBW/kg (Calculated) : 73 Heparin Dosing Weight: 90.7 kg  Vital Signs: Temp: 99.7 F (37.6 C) (07/20 0615) BP: 112/80 (07/20 0000) Pulse Rate: 88 (07/20 0615)  Labs: Recent Labs    12/31/21 0323 12/31/21 1036 01/01/22 0346 01/01/22 1200 01/01/22 1230 01/01/22 2018 01/01/22 2227 01/02/22 0501  HGB 10.7*  --  9.5*   < > 10.1*  --  10.9* 9.6*  HCT 32.6*  --  29.1*   < > 30.6*  --  32.0* 29.5*  PLT 154  --  166  --  194  --   --  200  LABPROT 38.3*  --  36.1*  --   --   --   --  20.6*  INR 4.0*  --  3.7*  --   --   --   --  1.8*  HEPARINUNFRC  --    < > 0.31  --  0.43  --   --  0.83*  CREATININE 0.70  --  0.68  --   --  0.63  --   --    < > = values in this interval not displayed.     Estimated Creatinine Clearance: 151 mL/min (by C-G formula based on SCr of 0.63 mg/dL).   Medical History: Past Medical History:  Diagnosis Date   Anemia    Autoimmune disorder (HCC)    pyoderma gangrenosum   CHF (congestive heart failure) (HCC)    Chronic systolic heart failure (HCC)    a. EF 35-40% by echo in 07/2018 b. EF at 45% by repeat echo in 04/2020   DVT (deep venous thrombosis) (HCC)    h/o   Dysrhythmia    Mitral regurgitation    a. 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 b. 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HCC)    arthritis   Tricuspid regurgitation    Assessment: 7 yom with a history of mitral regurgitation s/p valve replacement on Coumadin, HF, AF and prior DVT, and Hx of hidradenitis and pyoderma gangrenosum . Patient presenting  with weakness.  Patient taking warfarin pta. Home dose is 4mg  daily per last [facility] note. INR 1.4 on 7/10 instructed to take 6mg  that night and then continue 4mg  daily. Last taken 7/14.  Heparin level above goal after being previously therapeutic: 0.83, per RN no issues with bruising or bleeding. Hgb low but stable - since aiming for low end of therapeutic goal will decrease ~3 units/kg/hr  Goal of Therapy:  Heparin level: 0.3-0.7 units/mL (targeting low end of goal range) Monitor platelets by anticoagulation protocol: Yes   Plan:  Reduce heparin to 2400 units/hr Recheck heparin level in 6 hours Daily CBC  9/10, PharmD Clinical Pharmacist 01/02/2022 6:51 AM Please check AMION for all [facility] Pharmacy numbers

## 2022-01-02 NOTE — Progress Notes (Signed)
PT Cancellation Note  Patient Details Name: Donald [name] MRN: [id number] DOB: 10-27-1985   Cancelled Treatment:    Reason Eval/Treat Not Completed: Other (comment) (await Swan removal for mobility)   [name] B [name] 01/02/2022, 8:32 AM [name], PT Acute Rehabilitation Services Office: [phone]

## 2022-01-02 NOTE — Progress Notes (Signed)
  Inpatient Rehabilitation Coordinator  Patient was screened for CIR candidacy. Patient appears to be a potential candidate for CIR/AIR level rehab. I will not have a bed available this week to pursue admit. I will not place a rehab consult. Other rehab venues should be pursued. Acute team and TOC made aware. Please call me with any questions.  Ottie Glazier, RN, MSN Rehab Admissions Coordinator [phone] 01/02/2022 12:34 PM

## 2022-01-02 NOTE — Progress Notes (Signed)
[facility]   Reason for visit: Follow up on prosthetic MV endocarditis  Interval History: no positive growth from cultures; WBC 12, Tmax 101.5.  mother at bedside.  Day 7 total antibiotics    Physical Exam: Constitutional:  Vitals:   01/02/22 0945 01/02/22 1000  BP:    Pulse: 75 82  Resp: (!) 35 (!) 21  Temp: 98.4 F (36.9 C) 98.6 F (37 C)  SpO2: 97% 94%   patient appears in NAD, sleeping Eyes: anicteric HENT: + nasal cannula Respiratory: normal respiratory effort Lines: left picc without erythema, left catheter without erythema  Review of Systems: Unable to be assessed due to patient factors  Lab Results  Component Value Date   WBC 12.0 (H) 01/02/2022   HGB 9.6 (L) 01/02/2022   HCT 29.5 (L) 01/02/2022   MCV 79.7 (L) 01/02/2022   PLT 200 01/02/2022    Lab Results  Component Value Date   CREATININE 0.74 01/02/2022   BUN 13 01/02/2022   NA 135 01/02/2022   K 3.9 01/02/2022   CL 104 01/02/2022   CO2 23 01/02/2022    Lab Results  Component Value Date   ALT 1,680 (H) 01/02/2022   AST 2,467 (H) 01/02/2022   ALKPHOS 133 (H) 01/02/2022     Microbiology: Recent Results (from the past 240 hour(s))  Culture, blood (routine x 2)     Status: None   Collection Time: 12/27/21  2:52 PM   Specimen: BLOOD  Result Value Ref Range Status   Specimen Description BLOOD RIGHT ANTECUBITAL  Final   Special Requests   Final    BOTTLES DRAWN AEROBIC AND ANAEROBIC Blood Culture adequate volume   Culture   Final    NO GROWTH 5 DAYS Performed at [facility], [address], [place], [place] [address]    Report Status 01/02/2022 FINAL  Final  Resp Panel by RT-PCR (Flu A&B, Covid) Anterior Nasal Swab     Status: None   Collection Time: 12/27/21  6:04 PM   Specimen: Anterior Nasal Swab  Result Value Ref Range Status   SARS Coronavirus 2 by RT PCR NEGATIVE NEGATIVE Final    Comment: (NOTE) SARS-CoV-2 target nucleic acids are NOT  DETECTED.  The SARS-CoV-2 RNA is generally detectable in upper respiratory specimens during the acute phase of infection. The lowest concentration of SARS-CoV-2 viral copies this assay can detect is 138 copies/mL. A negative result does not preclude SARS-Cov-2 infection and should not be used as the sole basis for treatment or other patient management decisions. A negative result may occur with  improper specimen collection/handling, submission of specimen other than nasopharyngeal swab, presence of viral mutation(s) within the areas targeted by this assay, and inadequate number of viral copies(<138 copies/mL). A negative result must be combined with clinical observations, patient history, and epidemiological information. The expected result is Negative.  Fact Sheet for Patients:  BloggerCourse.com  Fact Sheet for Healthcare Providers:  SeriousBroker.it  This test is no t yet approved or cleared by the [place] FDA and  has been authorized for detection and/or diagnosis of SARS-CoV-2 by FDA under an Emergency Use Authorization (EUA). This EUA will remain  in effect (meaning this test can be used) for the duration of the COVID-19 declaration under Section 564(b)(1) of the Act, 21 U.S.C.section 360bbb-3(b)(1), unless the authorization is terminated  or revoked sooner.       Influenza A by PCR NEGATIVE NEGATIVE Final   Influenza B  by PCR NEGATIVE NEGATIVE Final    Comment: (NOTE) The Xpert Xpress SARS-CoV-2/FLU/RSV plus assay is intended as an aid in the diagnosis of influenza from Nasopharyngeal swab specimens and should not be used as a sole basis for treatment. Nasal washings and aspirates are unacceptable for Xpert Xpress SARS-CoV-2/FLU/RSV testing.  Fact Sheet for Patients: BloggerCourse.com  Fact Sheet for Healthcare Providers: SeriousBroker.it  This test is not yet  approved or cleared by the [place] FDA and has been authorized for detection and/or diagnosis of SARS-CoV-2 by FDA under an Emergency Use Authorization (EUA). This EUA will remain in effect (meaning this test can be used) for the duration of the COVID-19 declaration under Section 564(b)(1) of the Act, 21 U.S.C. section 360bbb-3(b)(1), unless the authorization is terminated or revoked.  Performed at [facility], [address] [place], [place] [address]   Culture, blood (Routine X 2) w Reflex to ID Panel     Status: None   Collection Time: 12/28/21 10:45 AM   Specimen: BLOOD  Result Value Ref Range Status   Specimen Description BLOOD CENTRAL LINE  Final   Special Requests   Final    BOTTLES DRAWN AEROBIC AND ANAEROBIC Blood Culture adequate volume   Culture   Final    NO GROWTH 5 DAYS Performed at [facility], [address], [place], [place] [address]    Report Status 01/02/2022 FINAL  Final  Culture, Respiratory w Gram Stain     Status: None   Collection Time: 12/29/21  9:00 AM   Specimen: Tracheal Aspirate; Respiratory  Result Value Ref Range Status   Specimen Description TRACHEAL ASPIRATE  Final   Special Requests NONE  Final   Gram Stain   Final    MODERATE WBC PRESENT,BOTH PMN AND MONONUCLEAR NO ORGANISMS SEEN    Culture   Final    NO GROWTH 2 DAYS Performed at [facility], [address] [place], [place] [address]    Report Status 12/31/2021 FINAL  Final  Culture, blood (Routine X 2) w Reflex to ID Panel     Status: None (Preliminary result)   Collection Time: 12/29/21  9:58 AM   Specimen: BLOOD LEFT HAND  Result Value Ref Range Status   Specimen Description BLOOD LEFT HAND  Final   Special Requests   Final    BOTTLES DRAWN AEROBIC AND ANAEROBIC Blood Culture adequate volume   Culture   Final    NO GROWTH 4 DAYS Performed at [facility], [address] [place], [place] [address]    Report Status PENDING  Incomplete  Culture,  blood (Routine X 2) w Reflex to ID Panel     Status: None (Preliminary result)   Collection Time: 12/29/21  9:58 AM   Specimen: BLOOD LEFT ARM  Result Value Ref Range Status   Specimen Description BLOOD LEFT ARM  Final   Special Requests   Final    BOTTLES DRAWN AEROBIC AND ANAEROBIC Blood Culture results may not be optimal due to an inadequate volume of blood received in culture bottles   Culture   Final    NO GROWTH 4 DAYS Performed at [facility], 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  He remains on vancomycin and cefepime and will likely need to continue these both for the duration.  2. Fever - still with fever intermittently but not unexpected with #1/.  Will continue to monitor.   3.  Medication monitoring - his creat has remained stable on the vancomycin so no changes indicated.  Will continue to monitor his labs.

## 2022-01-02 NOTE — TOC Initial Note (Signed)
Transition of Care [facility]) - Initial/Assessment Note    Patient Details  Name: Donald [name] MRN: [id number] Date of Birth: 1985-12-20  Transition of Care [facility]) CM/SW Contact:    Elliot Cousin, RN Phone Number: [phone] 01/02/2022, 3:48 PM  Clinical Narrative:                 HF TOC CM spoke to pt and mother at bedside. Mother is requesting a SNF rehab at [facility]. Explained PT recommended IP rehab. Mother would like a rehab close to [place]. Will research facilities close to [place].     Expected Discharge Plan: IP Rehab Facility Barriers to Discharge: Continued Medical Work up   Patient Goals and CMS Choice Patient states their goals for this hospitalization and ongoing recovery are:: wants him to get stronger CMS Medicare.gov Compare Post Acute Care list provided to:: Patient Represenative (must comment) (mother-Shirley) Choice offered to / list presented to : Parent  Expected Discharge Plan and Services Expected Discharge Plan: IP Rehab Facility   Discharge Planning Services: CM Consult Post Acute Care Choice: IP Rehab Living arrangements for the past 2 months: Single Family Home                                      Prior Living Arrangements/Services Living arrangements for the past 2 months: Single Family Home Lives with:: Parents   Do you feel safe going back to the place where you live?: Yes      Need for Family Participation in Patient Care: Yes (Comment) Care giver support system in place?: Yes (comment)   Criminal Activity/Legal Involvement Pertinent to Current Situation/Hospitalization: No - Comment as needed  Activities of Daily Living Home Assistive Devices/Equipment: None ADL Screening (condition at time of admission) Patient's cognitive ability adequate to safely complete daily activities?: Yes Is the patient deaf or have difficulty hearing?: No Does the patient have difficulty seeing, even when wearing glasses/contacts?: No Does  the patient have difficulty concentrating, remembering, or making decisions?: No Patient able to express need for assistance with ADLs?: No Does the patient have difficulty dressing or bathing?: No Independently performs ADLs?: Yes (appropriate for developmental age) Does the patient have difficulty walking or climbing stairs?: No Weakness of Legs: None Weakness of Arms/Hands: None  Permission Sought/Granted Permission sought to share information with : Case Manager, Family Supports, PCP Permission granted to share information with : Yes, Verbal Permission Granted  Share Information with NAME: Bailen Geffre     Permission granted to share info w Relationship: mother  Permission granted to share info w Contact Information: [phone]  Emotional Assessment   Attitude/Demeanor/Rapport: Lethargic Affect (typically observed): Accepting Orientation: : Oriented to Self   Psych Involvement: No (comment)  Admission diagnosis:  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] Dehydration [E86.0] Elevated troponin [R77.8] Acute febrile illness [R50.9] Generalized weakness [R53.1] Elevated lactic acid level [R79.89] Nausea vomiting and diarrhea [R11.2, R19.7] H/O mitral valve replacement [Z95.2] Anticoagulated on Coumadin [Z79.01] Sepsis (HCC) [A41.9] Cardiomyopathy, unspecified type (HCC) [I42.9] Patient Active Problem List   Diagnosis Date Noted   Acute endocarditis    AKI (acute kidney injury) (HCC)    Septic shock (HCC) 12/28/2021   Acute febrile illness    Anticoagulated on Coumadin    Dehydration    Elevated lactic acid level    Nausea vomiting and diarrhea    Gastroenteritis 12/27/2021   Atrial fibrillation with RVR (HCC) 08/03/2021  Sacral wound 08/03/2021   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   Shock liver 08/03/2021   Upper GI bleed 08/03/2021   Microcytic anemia 08/03/2021   Hyponatremia 08/03/2021   Cardiogenic shock (HCC)    Acute hypoxemic respiratory failure (HCC) 07/24/2021    Encounter for central line placement    H/O mitral valve replacement 06/20/2021   Encounter for therapeutic drug monitoring 06/20/2021   Chronic heart failure (HCC) 05/30/2021   Acute respiratory failure with hypoxia (HCC) 05/30/2021   Hypokalemia 05/30/2021   Elevated brain natriuretic peptide (BNP) level 05/30/2021   Elevated troponin 05/30/2021   Elevated serum creatinine 05/30/2021   Hypoalbuminemia due to protein-calorie malnutrition (HCC) 05/30/2021   Prolonged QT interval 05/30/2021   Transaminitis 05/30/2021   Paroxysmal atrial flutter (HCC) 05/30/2021   Acute systolic CHF (congestive heart failure) (HCC) 05/30/2021   Acute on chronic systolic and diastolic heart failure, NYHA class 3 (HCC) 05/30/2021   [place]    Chronic periodontitis    Retained dental root    Tricuspid regurgitation    Mitral regurgitation    Acute on chronic HFrEF (heart failure with reduced ejection fraction) (HCC)    Autoimmune disorder (HCC)    Seronegative spondylitis (HCC)    PCP:  No primary care provider on file. Pharmacy:   [facility] [address] [place] - [address] [place] [address] Phone: [phone] Fax: [phone]  [facility] [address] [place] [address] Phone: [phone] Fax: [phone]  [facility] [address] [place] [address] Phone: [phone] Fax: [phone]     Social Determinants of Health (SDOH) Interventions    Readmission Risk Interventions     No data to display

## 2022-01-03 DIAGNOSIS — R6521 Severe sepsis with septic shock: Secondary | ICD-10-CM | POA: Diagnosis not present

## 2022-01-03 DIAGNOSIS — K529 Noninfective gastroenteritis and colitis, unspecified: Secondary | ICD-10-CM | POA: Diagnosis not present

## 2022-01-03 DIAGNOSIS — R570 Cardiogenic shock: Secondary | ICD-10-CM | POA: Diagnosis not present

## 2022-01-03 DIAGNOSIS — I330 Acute and subacute infective endocarditis: Secondary | ICD-10-CM | POA: Diagnosis not present

## 2022-01-03 DIAGNOSIS — A419 Sepsis, unspecified organism: Secondary | ICD-10-CM | POA: Diagnosis not present

## 2022-01-03 DIAGNOSIS — I5022 Chronic systolic (congestive) heart failure: Secondary | ICD-10-CM | POA: Diagnosis not present

## 2022-01-03 LAB — CULTURE, BLOOD (ROUTINE X 2)
Culture: NO GROWTH
Culture: NO GROWTH
Special Requests: ADEQUATE

## 2022-01-03 LAB — CBC
HCT: 30.2 % — ABNORMAL LOW (ref 39.0–52.0)
Hemoglobin: 9.5 g/dL — ABNORMAL LOW (ref 13.0–17.0)
MCH: 25.4 pg — ABNORMAL LOW (ref 26.0–34.0)
MCHC: 31.5 g/dL (ref 30.0–36.0)
MCV: 80.7 fL (ref 80.0–100.0)
Platelets: 242 10*3/uL (ref 150–400)
RBC: 3.74 MIL/uL — ABNORMAL LOW (ref 4.22–5.81)
RDW: 15.9 % — ABNORMAL HIGH (ref 11.5–15.5)
WBC: 11.3 10*3/uL — ABNORMAL HIGH (ref 4.0–10.5)
nRBC: 0.2 % (ref 0.0–0.2)

## 2022-01-03 LAB — GLUCOSE, CAPILLARY
Glucose-Capillary: 106 mg/dL — ABNORMAL HIGH (ref 70–99)
Glucose-Capillary: 109 mg/dL — ABNORMAL HIGH (ref 70–99)
Glucose-Capillary: 117 mg/dL — ABNORMAL HIGH (ref 70–99)
Glucose-Capillary: 121 mg/dL — ABNORMAL HIGH (ref 70–99)
Glucose-Capillary: 123 mg/dL — ABNORMAL HIGH (ref 70–99)
Glucose-Capillary: 135 mg/dL — ABNORMAL HIGH (ref 70–99)
Glucose-Capillary: 97 mg/dL (ref 70–99)

## 2022-01-03 LAB — BASIC METABOLIC PANEL
Anion gap: 3 — ABNORMAL LOW (ref 5–15)
BUN: 13 mg/dL (ref 6–20)
CO2: 28 mmol/L (ref 22–32)
Calcium: 8.3 mg/dL — ABNORMAL LOW (ref 8.9–10.3)
Chloride: 107 mmol/L (ref 98–111)
Creatinine, Ser: 0.64 mg/dL (ref 0.61–1.24)
GFR, Estimated: >60 mL/min (ref >60)
Glucose, Bld: 130 mg/dL — ABNORMAL HIGH (ref 70–99)
Potassium: 4 mmol/L (ref 3.5–5.1)
Sodium: 138 mmol/L (ref 135–145)

## 2022-01-03 LAB — COOXEMETRY PANEL
Carboxyhemoglobin: 2.1 % — ABNORMAL HIGH (ref 0.5–1.5)
Methemoglobin: <0.7 % (ref 0.0–1.5)
O2 Saturation: 59.4 %
Total hemoglobin: 10.2 g/dL — ABNORMAL LOW (ref 12.0–16.0)

## 2022-01-03 LAB — HEPATIC FUNCTION PANEL
ALT: 1123 U/L — ABNORMAL HIGH (ref 0–44)
AST: 1352 U/L — ABNORMAL HIGH (ref 15–41)
Albumin: 2 g/dL — ABNORMAL LOW (ref 3.5–5.0)
Alkaline Phosphatase: 122 U/L (ref 38–126)
Bilirubin, Direct: 0.4 mg/dL — ABNORMAL HIGH (ref 0.0–0.2)
Indirect Bilirubin: 0.9 mg/dL (ref 0.3–0.9)
Total Bilirubin: 1.3 mg/dL — ABNORMAL HIGH (ref 0.3–1.2)
Total Protein: 6.9 g/dL (ref 6.5–8.1)

## 2022-01-03 LAB — HEPARIN LEVEL (UNFRACTIONATED): Heparin Unfractionated: 0.59 IU/mL (ref 0.30–0.70)

## 2022-01-03 LAB — PROTIME-INR
INR: 1.4 — ABNORMAL HIGH (ref 0.8–1.2)
Prothrombin Time: 17.1 seconds — ABNORMAL HIGH (ref 11.4–15.2)

## 2022-01-03 MED ORDER — MIDODRINE HCL 5 MG PO TABS: 2.5000 mg | ORAL_TABLET | Freq: Three times a day (TID) | ORAL | Status: DC

## 2022-01-03 MED ORDER — VITAL 1.5 CAL PO LIQD
1000.0000 mL | ORAL | Status: DC
  Administered 2022-01-03 – 2022-01-04 (×2): 1000 mL

## 2022-01-03 MED ORDER — ENSURE ENLIVE PO LIQD
237.0000 mL | Freq: Three times a day (TID) | ORAL | Status: DC
  Administered 2022-01-03 – 2022-01-17 (×36): 237 mL via ORAL

## 2022-01-03 MED ORDER — WARFARIN - PHARMACIST DOSING INPATIENT: Freq: Every day | Status: DC

## 2022-01-03 MED ORDER — MIDODRINE HCL 5 MG PO TABS
5.0000 mg | ORAL_TABLET | Freq: Three times a day (TID) | ORAL | Status: DC
  Administered 2022-01-03 – 2022-01-06 (×12): 5 mg
  Filled 2022-01-03 (×12): qty 1

## 2022-01-03 MED ORDER — WARFARIN SODIUM 2 MG PO TABS
2.0000 mg | ORAL_TABLET | Freq: Once | ORAL | Status: AC
  Administered 2022-01-03: 2 mg
  Filled 2022-01-03: qty 1

## 2022-01-03 MED ORDER — MIDODRINE HCL 5 MG PO TABS: 5.0000 mg | ORAL_TABLET | Freq: Three times a day (TID) | ORAL | Status: DC

## 2022-01-03 NOTE — Progress Notes (Signed)
Pharmacy Antibiotic Note  Donald [name] is a 36 y.o. male for which pharmacy has been consulted for cefepime and vancomycin dosing for prosthetic valve endocarditis.   Patient with a history of mitral regurgitation s/p valve replacement on Coumadin, HF, AF and prior DVT, and Hx of hidradenitis and pyoderma gangrenosum . Now found to have a vegetation on mechanical mitral valve. Blood cultures are negative on 7/14 and 7/16. Scr stable at 0.64. Last Vanc levels on 7/18 showed a therapeutic AUC of 517.   Plan: Continue Vancomycin 750 mg every 8 hours   Continue cefepime 2 gm IV Q 8 hours  F/u cultures, renal function and clinical course Will plan for repeat levels early next week unless SCr bumps then would check sooner Will need 6 weeks of IV antibiotics   Height: 5\' 10"  (177.8 cm) Weight: 92 kg (202 lb 13.2 oz) IBW/kg (Calculated) : 73  Temp (24hrs), Avg:98.7 F (37.1 C), Min:97.7 F (36.5 C), Max:99 F (37.2 C)  Recent Labs  Lab 12/28/21 1430 12/28/21 1632 12/28/21 1640 12/28/21 2040 12/29/21 0323 12/29/21 0603 12/30/21 0150 12/30/21 1300 12/31/21 0323 12/31/21 1036 12/31/21 1238 01/01/22 0346 01/01/22 1230 01/01/22 2018 01/02/22 0501 01/03/22 0345  WBC  --   --   --   --  12.3*  --  12.3*  --  13.3*  --   --  12.4* 11.8*  --  12.0* 11.3*  CREATININE  --   --    < > 2.59* 2.39*  --  1.33*   < > 0.70  --   --  0.68  --  0.63 0.74 0.64  LATICACIDVEN >9.0* >9.0*  --  6.5*  --  2.2* 1.8  --   --   --   --   --   --   --   --   --   VANCOTROUGH  --   --   --   --   --   --   --   --   --  14*  --   --   --   --   --   --   VANCOPEAK  --   --   --   --   --   --   --   --   --   --  81*  --   --   --   --   --   VANCORANDOM  --   --   --   --  22  --   --   --   --   --   --   --   --   --   --   --    < > = values in this interval not displayed.     Estimated Creatinine Clearance: 149.7 mL/min (by C-G formula based on SCr of 0.64 mg/dL).    No Known  Allergies  Antimicrobials this admission: cefepime 7/14 >>  vancomycin 7/14 >>  metronidazole 7/14 >> 7/14  Microbiology results: *FYI it looks like only 1 cx on 7/14 prior to abx - several orders "cancelled" 7/15 BCx x 2 > ngf 7/14 BCx  ngf 7/16 Resp Cx > ngf   Thank you for allowing pharmacy to be a part of this patient's care.  8/16, PharmD, BCPS, BCIDP Infectious Diseases Clinical Pharmacist Phone: [phone] 01/03/2022 7:59 AM

## 2022-01-03 NOTE — Progress Notes (Signed)
ANTICOAGULATION CONSULT NOTE  Pharmacy Consult for heparin>>warfarin Indication:  MVR /AFib  No Known Allergies  Patient Measurements: Height: 5\' 10"  (177.8 cm) Weight: 92 kg (202 lb 13.2 oz) IBW/kg (Calculated) : 73 Heparin Dosing Weight: 90.7 kg  Vital Signs: Temp: 98.8 F (37.1 C) (07/21 0645) BP: 105/78 (07/21 0400) Pulse Rate: 74 (07/21 0645)  Labs: Recent Labs    01/01/22 0346 01/01/22 1200 01/01/22 1230 01/01/22 2018 01/01/22 2227 01/02/22 0501 01/02/22 1304 01/03/22 0345  HGB 9.5*   < > 10.1*  --  10.9* 9.6*  --  9.5*  HCT 29.1*   < > 30.6*  --  32.0* 29.5*  --  30.2*  PLT 166  --  194  --   --  200  --  242  LABPROT 36.1*  --   --   --   --  20.6*  --  17.1*  INR 3.7*  --   --   --   --  1.8*  --  1.4*  HEPARINUNFRC 0.31  --  0.43  --   --  0.83* 0.73* 0.59  CREATININE 0.68  --   --  0.63  --  0.74  --  0.64   < > = values in this interval not displayed.     Estimated Creatinine Clearance: 149.7 mL/min (by C-G formula based on SCr of 0.64 mg/dL).   Medical History: Past Medical History:  Diagnosis Date   Anemia    Autoimmune disorder (HCC)    pyoderma gangrenosum   CHF (congestive heart failure) (HCC)    Chronic systolic heart failure (HCC)    a. EF 35-40% by echo in 07/2018 b. EF at 45% by repeat echo in 04/2020   DVT (deep venous thrombosis) (HCC)    h/o   Dysrhythmia    Mitral regurgitation    a. 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 b. 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HCC)    arthritis   Tricuspid regurgitation    Assessment: 70 yom with a history of mitral regurgitation s/p valve replacement on Coumadin, HF, AF and prior DVT, and Hx of hidradenitis and pyoderma gangrenosum . Patient presenting with  weakness.  Patient taking warfarin pta. Home dose is 4mg  daily per last [facility] note. INR 1.4 on 7/10 instructed to take 6mg  that night and then continue 4mg  daily. Last taken 7/14.  Heparin level at goal at 0.59 on heparin 2300 units/hr. Ok per cardiology to resume warfarin tonight. INR down significantly to 1.4. LFTs also down. Will be very cautious with warfarin dosing.   Goal of Therapy:  INR goal 2.5-3.5 Heparin level: 0.3-0.7 units/mL (targeting low end of goal range) Monitor platelets by anticoagulation protocol: Yes   Plan:  Continue heparin at 2300 units/hr Warfarin 2mg  tonight Daily INR, heparin level, and cbc  9/10 PharmD., BCPS Clinical Pharmacist 01/03/2022 7:24 AM

## 2022-01-03 NOTE — Evaluation (Signed)
Occupational Therapy Evaluation Patient Details Name: Donald [name] MRN: [id number] DOB: 1985-09-29 Today's Date: 01/03/2022   History of Present Illness 36 yo male admitted 7/14 with N/V/D and sepsis. Pt with prosthetic mitral valve endocarditis. 7/15-7/19 VDRF. 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  PTA he lives with his mother and helps care for his child.  Patient, per the mother, was independent with all mobility and self care.  Deficits impacting independence are listed below.  Currently he is needing up to Min A for basic transfers, and up to Mod A for lower body ADL.  OT to follow in the acute setting with AIR recommended for post acute rehab prior to returning home.  Patient should progress quickly, and can tolerate intensive rehab.       Recommendations for follow up therapy are one component of a multi-disciplinary discharge planning process, led by the attending physician.  Recommendations may be updated based on patient status, additional functional criteria and insurance authorization.   Follow Up Recommendations  Acute inpatient rehab (3hours/day)    Assistance Recommended at Discharge Frequent or constant Supervision/Assistance  Patient can return home with the following A little help with walking and/or transfers;A lot of help with bathing/dressing/bathroom;Assistance with cooking/housework;Help with stairs or ramp for entrance;Assist for transportation;Direct supervision/assist for financial management;Direct supervision/assist for medications management    Functional Status Assessment  Patient has had a recent decline in their functional status and demonstrates the ability to make significant improvements in function in a reasonable and predictable amount of time.  Equipment Recommendations  None recommended by OT    Recommendations for Other Services        Precautions / Restrictions Precautions Precautions: Fall;Other (comment) Precaution Comments: cortrak, sacral wounds, foley and retal tube Restrictions Weight Bearing Restrictions: No      Mobility Bed Mobility Overal bed mobility: Needs Assistance Bed Mobility: Supine to Sit     Supine to sit: Min guard          Transfers Overall transfer level: Needs assistance Equipment used: 1 person hand held assist Transfers: Sit to/from Stand, Bed to chair/wheelchair/BSC Sit to Stand: Min assist Stand pivot transfers: Min assist                Balance Overall balance assessment: Needs assistance Sitting-balance support: Feet supported, Single extremity supported Sitting balance-Leahy Scale: Fair     Standing balance support: Single extremity supported, Bilateral upper extremity supported Standing balance-Leahy Scale: Poor                             ADL either performed or assessed with clinical judgement   ADL Overall ADL's : Needs assistance/impaired Eating/Feeding: NPO   Grooming: Wash/dry hands;Wash/dry face;Supervision/safety;Sitting   Upper Body Bathing: Minimal assistance;Sitting   Lower Body Bathing: Moderate assistance;Sit to/from stand   Upper Body Dressing : Minimal assistance;Moderate assistance;Sitting   Lower Body Dressing: Moderate assistance;Sit to/from stand                       Vision   Vision Assessment?: No apparent visual deficits     Perception Perception Perception: Not tested   Praxis Praxis Praxis: Not tested    Pertinent Vitals/Pain Pain Assessment Pain Assessment: Faces Faces Pain Scale: No hurt Pain Intervention(s): Monitored during session     Hand Dominance  Extremity/Trunk Assessment Upper Extremity Assessment Upper Extremity Assessment: Generalized weakness   Lower Extremity Assessment Lower Extremity Assessment: Defer to PT evaluation   Cervical / Trunk Assessment Cervical /  Trunk Assessment: Normal   Communication Communication Communication: Other (comment) (shakes his head yes/no appropriately)   Cognition Arousal/Alertness: Lethargic Behavior During Therapy: Flat affect Overall Cognitive Status: Impaired/Different from baseline                     Current Attention Level: Sustained   Following Commands: Follows one step commands with increased time     Problem Solving: Slow processing, Requires verbal cues, Decreased initiation       General Comments   VSS on RA    Exercises     Shoulder Instructions      Home Living Family/patient expects to be discharged to:: Private residence Living Arrangements: Parent Available Help at Discharge: Family;Available 24 hours/day Type of Home: House Home Access: Stairs to enter [employer] of Steps: 4 Entrance Stairs-Rails: Left Home Layout: One level     Bathroom Shower/Tub: Tub/shower unit;Walk-in shower   Bathroom Toilet: Handicapped height Bathroom Accessibility: Yes How Accessible: Accessible via walker Home Equipment: Grab bars - tub/shower;Shower seat          Prior Functioning/Environment Prior Level of Function : Independent/Modified Independent             Mobility Comments: independent with no AD ADLs Comments: independent        OT Problem List: Impaired balance (sitting and/or standing);Decreased activity tolerance;Decreased safety awareness;Decreased knowledge of use of DME or AE;Decreased cognition;Decreased strength      OT Treatment/Interventions: Self-care/ADL training;Therapeutic exercise;Therapeutic activities;Cognitive remediation/compensation;Patient/family education;DME and/or AE instruction;Balance training    OT Goals(Current goals can be found in the care plan section) Acute Rehab OT Goals Patient Stated Goal: per mother - regain independence OT Goal Formulation: With patient/family Time For Goal Achievement: 01/17/22 Potential to  Achieve Goals: Good ADL Goals Pt Will Perform Grooming: with supervision;standing Pt Will Perform Upper Body Dressing: sitting;with set-up Pt Will Perform Lower Body Dressing: with min guard assist;sit to/from stand Pt Will Transfer to Toilet: with supervision;ambulating;regular height toilet Pt/caregiver will Perform Home Exercise Program: Increased strength;Both right and left upper extremity;With theraband;With Supervision  OT Frequency: Min 2X/week    Co-evaluation              AM-PAC OT "6 Clicks" Daily Activity     Outcome Measure Help from another person eating meals?: Total Help from another person taking care of personal grooming?: A Little Help from another person toileting, which includes using toliet, bedpan, or urinal?: A Lot Help from another person bathing (including washing, rinsing, drying)?: A Lot Help from another person to put on and taking off regular upper body clothing?: A Little Help from another person to put on and taking off regular lower body clothing?: A Lot 6 Click Score: 13   End of Session Nurse Communication: Mobility status  Activity Tolerance: Patient tolerated treatment well Patient left: in chair;with call bell/phone within reach;with chair alarm set;with family/visitor present  OT Visit Diagnosis: Unsteadiness on feet (R26.81);Muscle weakness (generalized) (M62.81);Other symptoms and signs involving cognitive function                Time: 2836-6294 OT Time Calculation (min): 23 min Charges:  OT General Charges $OT Visit: 1 Visit OT Evaluation $OT Eval Moderate Complexity: 1 Mod OT Treatments $Self Care/Home Management : 8-22 mins  01/03/2022  RP, OTR/L  Acute Rehabilitation Services  Office:  [phone]   Suzanna Obey 01/03/2022, 9:26 AM

## 2022-01-03 NOTE — Progress Notes (Addendum)
Advanced Heart Failure Rounding Note   Subjective:   7/19 Extubated. Diuresed with IV lasix. I/O not accurate. Norepi weaned off.  7/20 Swan out   ID following. On vanc/cefepime. NGTD from [facility] 07/15 and 07/16. Tmax 99.3  Lactate has cleared. LFTS have peaked. Renal function back to baseline.  TEE + vegetation on MV  Milrinone 0.125 mcg+ Norep 3 mcg->CO-OX 59%    Had a hard time sleeping. Worked with PT.    Objective:     Vital Signs:   Temp:  [97.7 F (36.5 C)-99.3 F (37.4 C)] 98.8 F (37.1 C) (07/21 0645) Pulse Rate:  [73-86] 74 (07/21 0645) Resp:  [11-40] 25 (07/21 0645) BP: (96-112)/(74-85) 105/78 (07/21 0400) SpO2:  [92 %-100 %] 99 % (07/21 0645) Arterial Line BP: (85-116)/(57-81) 95/62 (07/21 0645) Weight:  [92 kg] 92 kg (07/21 0500) Last BM Date : 01/01/22  Weight change: Filed Weights   01/01/22 0431 01/02/22 0500 01/03/22 0500  Weight: 93 kg 93.8 kg 92 kg    Intake/Output:   Intake/Output Summary (Last 24 hours) at 01/03/2022 0711 Last data filed at 01/03/2022 0600 Gross per 24 hour  Intake 2945.5 ml  Output 2000 ml  Net 945.5 ml  General:  Appears weak.  No resp difficulty HEENT: normal. + CorTrak  Neck: supple. no JVD. Carotids 2+ bilat; no bruits. No lymphadenopathy or thryomegaly appreciated. RIJ. L subclavian Cor: PMI nondisplaced. Regular rate & rhythm. No rubs, gallops . Mechanical S1. Lungs: Coarse throughout. Room Air Abdomen: soft, nontender, nondistended. No hepatosplenomegaly. No bruits or masses. Good bowel sounds. Extremities: no cyanosis, clubbing, rash, edema. L brachial A-line Neuro: alert & orientedx3, cranial nerves grossly intact. moves all 4 extremities w/o difficulty. Affect flat  GU: foley  Telemetry: SR 70-90s   Labs: Basic Metabolic Panel: Recent Labs  Lab 12/28/21 1034 12/28/21 1552 12/29/21 0323 12/29/21 0403 12/30/21 0150 12/30/21 1300 12/31/21 0323 01/01/22 0346 01/01/22 1200 01/01/22 2018  01/01/22 2227 01/02/22 0501 01/03/22 0345  NA 131*   < > 135   < > 135 135 137 135 142 138 139 135 138  K 5.6*   < > 3.9   < > 4.1 3.8 3.3* 3.5 4.1 3.8 3.7 3.9 4.0  CL 93*   < > 97*  --  102 102 104 109  --  105  --  104 107  CO2 9*   < > 19*  --  23 20* 22 23  --  23  --  23 28  GLUCOSE 159*   < > 107*  --  130* 114* 112* 116*  --  119*  --  125* 130*  BUN 28*   < > 40*  --  31* 26* 22* 12  --  10  --  13 13  CREATININE 2.54*   < > 2.39*  --  1.33* 0.97 0.70 0.68  --  0.63  --  0.74 0.64  CALCIUM 8.4*   < > 8.1*  --  8.0* 7.7* 7.7* 7.2*  --  7.8*  --  7.7* 8.3*  MG 2.4  --  1.6*  --  2.6*  --   --   --   --  1.5*  --   --   --   PHOS  --   --   --    < > 1.7* 2.4* 1.7* 1.6*  --  1.7*  --  2.7  --    < > = values in this interval not  displayed.    Liver Function Tests: Recent Labs  Lab 12/30/21 0150 12/31/21 0323 01/01/22 0346 01/02/22 0501 01/03/22 0345  AST 4,576* 4,291* 4,151* 2,467* 1,352*  ALT 1,829* 2,229* 2,449* 1,680* 1,123*  ALKPHOS 146* 141* 140* 133* 122  BILITOT 2.2* 1.8* 1.4* 1.1 1.3*  PROT 8.2* 7.3 6.8 7.5 6.9  ALBUMIN 2.2* 1.9* 1.9* 2.0* 2.0*   No results for input(s): "LIPASE", "AMYLASE" in the last 168 hours. Recent Labs  Lab 01/01/22 0930  AMMONIA 24    CBC: Recent Labs  Lab 12/27/21 1451 12/28/21 0949 12/31/21 0323 01/01/22 0346 01/01/22 1200 01/01/22 1230 01/01/22 2227 01/02/22 0501 01/03/22 0345  WBC 6.2   < > 13.3* 12.4*  --  11.8*  --  12.0* 11.3*  NEUTROABS 4.2  --   --   --   --   --   --   --   --   HGB 10.6*   < > 10.7* 9.5* 10.9* 10.1* 10.9* 9.6* 9.5*  HCT 32.2*   < > 32.6* 29.1* 32.0* 30.6* 32.0* 29.5* 30.2*  MCV 79.5*   < > 79.5* 79.5*  --  77.9*  --  79.7* 80.7  PLT 369   < > 154 166  --  194  --  200 242   < > = values in this interval not displayed.    Cardiac Enzymes: No results for input(s): "CKTOTAL", "CKMB", "CKMBINDEX", "TROPONINI" in the last 168 hours.  BNP: BNP (last 3 results) Recent Labs    07/24/21 1433  10/11/21 1230 12/27/21 1451  BNP 3,184.1* 400.8* >4,500.0*    ProBNP (last 3 results) No results for input(s): "PROBNP" in the last 8760 hours.    Other results:  Imaging: DG Abd Portable 1V  Result Date: 01/01/2022 CLINICAL DATA:  Feeding tube placement. EXAM: PORTABLE ABDOMEN - 1 VIEW COMPARISON:  Chest and abdominal radiographs 12/28/2021. CT 12/27/2021. FINDINGS: Single AP semi erect view centered on the lower chest at 1130 hours. Feeding tube projects below the diaphragm with tip in the right upper quadrant, likely in the distal stomach or proximal duodenum. The visualized bowel gas pattern is normal. Right IJ Swan-Ganz catheter projects into the inter lobar right pulmonary artery, unchanged. There is a left subclavian central venous catheter projecting to the mid SVC level, unchanged. The heart remains moderately enlarged. IMPRESSION: Feeding tube projects to the level of the distal stomach or proximal duodenum. Unchanged position of Swan-Ganz central venous catheters. Electronically Signed   By: Carey Bullocks M.D.   On: 01/01/2022 11:38     Medications:     Scheduled Medications:  Chlorhexidine Gluconate Cloth  6 each Topical Daily   feeding supplement (PROSource TF)  45 mL Per Tube TID   mouth rinse  15 mL Mouth Rinse 4 times per day    Infusions:  sodium chloride 10 mL/hr at 01/03/22 0600   sodium chloride 10 mL/hr at 01/02/22 1441   sodium chloride Stopped (12/30/21 2208)   ceFEPime (MAXIPIME) IV 200 mL/hr at 01/03/22 0600   feeding supplement (VITAL 1.5 CAL) 60 mL/hr at 01/03/22 0600   heparin 2,300 Units/hr (01/03/22 0600)   milrinone 0.125 mcg/kg/min (01/03/22 0600)   norepinephrine (LEVOPHED) Adult infusion 3 mcg/min (01/03/22 0600)   vancomycin Stopped (01/03/22 0155)    PRN Medications: sodium chloride, acetaminophen (TYLENOL) oral liquid 160 mg/5 mL, ipratropium-albuterol, loperamide HCl, mouth rinse, oxyCODONE   Assessment/Plan:   1. Mixed  cardiogenic/septic shock - echo 12/28/21 LVEF 10% RV sev HK - TEE  7/15 severe biventricular failure. + vegetation on mechanical MVR - Bcx - NGTD - Respiratory culture w/ moderate WBCs (mononuclear and PMN) and no organisms  -Cultures no growth.  ID following. Continue vanc/cefepime. Remains febrile.   On milrinone 0.125+ Norepi 3 . WBC 11.3   2. 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 Repeat echo as above.  - PTA Spironolactone, Farxiga, Digoxin and Losartan currently held in the setting of shock and his AKI.  - Echo this admit LVEF 10% RV severely HK.  - Long-term only option is transplant but not sure he will be candidate - CVP 3-4. No diur - On Norepi 3 mcg + Milrinone 0.125 mcg. CO-OX 59%    3. 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  - Underwent MVR with mechanical mitral valve in 05/2021 at [facility].  - TEE 7/15 severe biventricular failure. + vegetation on mechanical MVR - Bcx pending. -> on vanc/cefepime - ID following. Appreciate assistance. - Continue heparin. INR 1.4   4. Acute hypoxic respiratory failure - intubated 7/15 in setting of shock - Extubated 719  - CCM managing.   5. Paroxysmal Atrial Fibrillation -On SR  - off amio with liver injury - does not tolerate AF  5. AKI - Due to ATN/shock - Baseline creatinine 0.98 - 1.0.  - Peak 2.6. Resolved.   6. Shock liver - continue supportive care AST /ALT continue trend down.   7. Autoimmune Disorder  - He has seronegative spondylitis and pyoderma gangrenosum. Followed by Rheumatology as an outpatient.   8. Hypomagnesemia/hypokalemia - Mag repleted, 2.6 yesterday - K 4  9. Deconditioning PT/OT  following. CIR   Remove foley, flexi seal, and introducer.    Length of Stay: 6  Amy Clegg NP-C   01/03/2022, 7:11 AM  Advanced Heart Failure Team Pager [phone] (M-F; 7a - 4p)  Please contact [facility] for night-coverage after hours (4p -7a ) and weekends on amion.   Agree with above  More alert today. On NE 3. Milrinone 0.125. Co-ox marginal at 59%. On IV abx Tm 99.3  CVP 3-4. On heparin/ INR 1.4  LFTs/renal function improving.   General:  Lying in bed No resp difficulty HEENT: normal Neck: supple. no JVD. Carotids 2+ bilat; no bruits. No lymphadenopathy or thryomegaly appreciated. Cor: PMI nondisplaced. Regular rate & rhythm. Mechanical s1 Lungs: clear Abdomen: soft, nontender, nondistended. No hepatosplenomegaly. No bruits or masses. Good bowel sounds. Extremities: no cyanosis, clubbing, rash, edema Neuro: alert & orientedx3, cranial nerves grossly intact. moves all 4 extremities w/o difficulty. Affect pleasant  Shock resolved but remains on low-dose inotropes and pressors. Add midodrine.Wean NE to keep SBP > 100. Continue milrinone. Continue IV abx x 6 weeks.   Remove Foley and swan introducer. Mobilize.   CRITICAL CARE Performed by: Arvilla Meres  Total critical care time: 35 minutes  Critical care time was exclusive of separately billable procedures and treating other patients.  Critical care was necessary to treat or prevent imminent or life-threatening deterioration.  Critical care was time spent personally by me (independent of midlevel providers or residents) on the following activities: development of treatment plan with patient and/or surrogate as well as nursing, discussions with consultants, evaluation of patient's response to  treatment, examination of patient, obtaining history from patient or surrogate, ordering and performing treatments and interventions, ordering and review of laboratory studies, ordering and review of radiographic studies,  pulse oximetry and re-evaluation of patient's condition.  Arvilla Meres, MD  8:35 AM

## 2022-01-03 NOTE — Evaluation (Signed)
Clinical/Bedside Swallow Evaluation Patient Details  Name: Donald [name] MRN: [id number] Date of Birth: 26-Jul-1985  Today's Date: 01/03/2022 Time: SLP Start Time (ACUTE ONLY): 0919 SLP Stop Time (ACUTE ONLY): 0929 SLP Time Calculation (min) (ACUTE ONLY): 10 min  Past Medical History:  Past Medical History:  Diagnosis Date   Anemia    Autoimmune disorder (HCC)    pyoderma gangrenosum   CHF (congestive heart failure) (HCC)    Chronic systolic heart failure (HCC)    a. EF 35-40% by echo in 07/2018 b. EF at 45% by repeat echo in 04/2020   DVT (deep venous thrombosis) (HCC)    h/o   Dysrhythmia    Mitral regurgitation    a. 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 b. 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HCC)    arthritis   Tricuspid regurgitation    Past Surgical History:  Past Surgical History:  Procedure Laterality Date   CARDIAC CATHETERIZATION     HERNIA REPAIR Right 2018   MITRAL VALVE REPAIR  06/07/2018   [facility] - Dr Meda Klinefelter   MITRAL VALVE REPLACEMENT N/A 06/23/2021   MULTIPLE EXTRACTIONS WITH ALVEOLOPLASTY N/A 11/08/2020   Procedure: EXTRACTION OF TEETH NUMBER ONE, FIFTHTEEN, SIXTEEN, SEVENTEEN, EIGHTTEEN AND THRTY WITH ALVEOLOPLASTY OF LOWER LEFT QUADRANT.;  Surgeon: Sharman Cheek, DMD;  Location: [facility] OR;  Service: Dentistry;  Laterality: N/A;   RIGHT/LEFT HEART CATH AND CORONARY ANGIOGRAPHY N/A 09/11/2020   Procedure: RIGHT/LEFT HEART CATH AND CORONARY ANGIOGRAPHY;  Surgeon: Dolores Patty, MD;  Location: [facility] INVASIVE CV LAB;  Service: Cardiovascular;  Laterality: N/A;   TEE WITHOUT CARDIOVERSION N/A 05/23/2020   Procedure: TRANSESOPHAGEAL ECHOCARDIOGRAM (TEE) WITH PROPOFOL;  Surgeon: [name], MD;   Location: [facility] ENDO SUITE;  Service: Endoscopy;  Laterality: N/A;   HPI:  36 year old male with who presented 7/14 with c/o of N/V/D, subjective fever for 2-3 days with poor PO intake and SOB but denied CP, palpitations, or abdominal pain. Transferred to ICU 7/15 cardiogenic and septic shock , found to have prosthetic mitral valve endocarditis on TEE, EF less than 20%. Pt prior hx of MR s/p MVR w/ mechanical valve on coumadin, Biventricular HF, and PAF.    Assessment / Plan / Recommendation  Clinical Impression  Pt presents with functional swallowing as assessed clinically. Pt tolerated all consistencies trialed, including serial straw sips of large volume of thin liquid (~6 oz) with no clinical s/s of aspiration.  Pt exhibited good oral clearance of solids.  Pt's voice is still dysphonic following extubation.  SLP to follow for diet tolerance.    Recommend regular texture diet with thin liquids.  SLP Visit Diagnosis: Dysphagia, unspecified (R13.10)    Aspiration Risk  No limitations    Diet Recommendation Regular;Thin liquid   Liquid Administration via: Cup;Straw Medication Administration: Whole meds with liquid Supervision: Patient able to self feed    Other  Recommendations Oral Care Recommendations: Oral care BID    Recommendations for follow up therapy are one component of a multi-disciplinary discharge planning process, led by the attending physician.  Recommendations may be updated based on patient status, additional functional criteria and insurance authorization.  Follow up Recommendations No SLP follow up      Assistance Recommended  at Discharge None  Functional Status Assessment Patient has not had a recent decline in their functional status  Frequency and Duration min 1 x/week  1 week       Prognosis Prognosis for Safe Diet Advancement:  (N/A)      Swallow Study   General Date of Onset: 12/27/21 HPI: 36 year old male with who presented 7/14 with c/o of N/V/D,  subjective fever for 2-3 days with poor PO intake and SOB but denied CP, palpitations, or abdominal pain. Transferred to ICU 7/15 cardiogenic and septic shock , found to have prosthetic mitral valve endocarditis on TEE, EF less than 20%. Pt prior hx of MR s/p MVR w/ mechanical valve on coumadin, Biventricular HF, and PAF. Type of Study: Bedside Swallow Evaluation Previous Swallow Assessment: none Diet Prior to this Study: NPO Temperature Spikes Noted: No Respiratory Status: Nasal cannula History of Recent Intubation: Yes Length of Intubations (days): 4 days Date extubated: 01/01/22 Behavior/Cognition: Alert;Cooperative;Pleasant mood Oral Cavity Assessment: Within Functional Limits Oral Care Completed by SLP: No Oral Cavity - Dentition: Adequate natural dentition Vision: Functional for self-feeding Self-Feeding Abilities: Able to feed self Patient Positioning: Upright in chair Baseline Vocal Quality: Breathy;Hoarse Volitional Cough: Strong Volitional Swallow: Able to elicit    Oral/Motor/Sensory Function Overall Oral Motor/Sensory Function: Mild impairment Facial ROM: Within Functional Limits Facial Symmetry: Within Functional Limits Lingual ROM: Within Functional Limits Lingual Symmetry: Within Functional Limits Lingual Strength: Reduced Velum: Within Functional Limits Mandible: Within Functional Limits   Ice Chips Ice chips: Not tested   Thin Liquid Thin Liquid: Within functional limits Presentation: Straw    Nectar Thick Nectar Thick Liquid: Not tested   Honey Thick Honey Thick Liquid: Not tested   Puree Puree: Within functional limits Presentation: Spoon   Solid     Solid: Within functional limits Presentation: Self Fed      Kerrie Pleasure, MA, CCC-SLP Acute Rehabilitation Services Office: [phone] 01/03/2022,10:48 AM

## 2022-01-03 NOTE — Progress Notes (Signed)
[facility]   Reason for visit: Follow up on prosthetic MV endocarditis  Interval History: fever curve trending down, WBC 11.3.  No acute distress Day 8 total antibiotics  Physical Exam: Constitutional:  Vitals:   01/03/22 1126 01/03/22 1129  BP:    Pulse:    Resp:    Temp: 98.8 F (37.1 C) 97.8 F (36.6 C)  SpO2:     patient appears in NAD Respiratory: Normal respiratory effort Skin: multiple lesions  Review of Systems: Constitutional: negative for fevers and chills  Lab Results  Component Value Date   WBC 11.3 (H) 01/03/2022   HGB 9.5 (L) 01/03/2022   HCT 30.2 (L) 01/03/2022   MCV 80.7 01/03/2022   PLT 242 01/03/2022    Lab Results  Component Value Date   CREATININE 0.64 01/03/2022   BUN 13 01/03/2022   NA 138 01/03/2022   K 4.0 01/03/2022   CL 107 01/03/2022   CO2 28 01/03/2022    Lab Results  Component Value Date   ALT 1,123 (H) 01/03/2022   AST 1,352 (H) 01/03/2022   ALKPHOS 122 01/03/2022     Microbiology: Recent Results (from the past 240 hour(s))  Culture, blood (routine x 2)     Status: None   Collection Time: 12/27/21  2:52 PM   Specimen: BLOOD  Result Value Ref Range Status   Specimen Description BLOOD RIGHT ANTECUBITAL  Final   Special Requests   Final    BOTTLES DRAWN AEROBIC AND ANAEROBIC Blood Culture adequate volume   Culture   Final    NO GROWTH 5 DAYS Performed at [facility], [address], [place], [place] [address]    Report Status 01/02/2022 FINAL  Final  Resp Panel by RT-PCR (Flu A&B, Covid) Anterior Nasal Swab     Status: None   Collection Time: 12/27/21  6:04 PM   Specimen: Anterior Nasal Swab  Result Value Ref Range Status   SARS Coronavirus 2 by RT PCR NEGATIVE NEGATIVE Final    Comment: (NOTE) SARS-CoV-2 target nucleic acids are NOT DETECTED.  The SARS-CoV-2 RNA is generally detectable in upper respiratory specimens during the acute phase of infection. The lowest concentration  of SARS-CoV-2 viral copies this assay can detect is 138 copies/mL. A negative result does not preclude SARS-Cov-2 infection and should not be used as the sole basis for treatment or other patient management decisions. A negative result may occur with  improper specimen collection/handling, submission of specimen other than nasopharyngeal swab, presence of viral mutation(s) within the areas targeted by this assay, and inadequate number of viral copies(<138 copies/mL). A negative result must be combined with clinical observations, patient history, and epidemiological information. The expected result is Negative.  Fact Sheet for Patients:  BloggerCourse.com  Fact Sheet for Healthcare Providers:  SeriousBroker.it  This test is no t yet approved or cleared by the [place] FDA and  has been authorized for detection and/or diagnosis of SARS-CoV-2 by FDA under an Emergency Use Authorization (EUA). This EUA will remain  in effect (meaning this test can be used) for the duration of the COVID-19 declaration under Section 564(b)(1) of the Act, 21 U.S.C.section 360bbb-3(b)(1), unless the authorization is terminated  or revoked sooner.       Influenza A by PCR NEGATIVE NEGATIVE Final   Influenza B by PCR NEGATIVE NEGATIVE Final    Comment: (NOTE) The Xpert Xpress SARS-CoV-2/FLU/RSV plus assay is intended as an aid in the diagnosis of  influenza from Nasopharyngeal swab specimens and should not be used as a sole basis for treatment. Nasal washings and aspirates are unacceptable for Xpert Xpress SARS-CoV-2/FLU/RSV testing.  Fact Sheet for Patients: BloggerCourse.com  Fact Sheet for Healthcare Providers: SeriousBroker.it  This test is not yet approved or cleared by the [place] FDA and has been authorized for detection and/or diagnosis of SARS-CoV-2 by FDA under an Emergency Use  Authorization (EUA). This EUA will remain in effect (meaning this test can be used) for the duration of the COVID-19 declaration under Section 564(b)(1) of the Act, 21 U.S.C. section 360bbb-3(b)(1), unless the authorization is terminated or revoked.  Performed at [facility], [address], [place], [place] [address]   Culture, blood (Routine X 2) w Reflex to ID Panel     Status: None   Collection Time: 12/28/21 10:45 AM   Specimen: BLOOD  Result Value Ref Range Status   Specimen Description BLOOD CENTRAL LINE  Final   Special Requests   Final    BOTTLES DRAWN AEROBIC AND ANAEROBIC Blood Culture adequate volume   Culture   Final    NO GROWTH 5 DAYS Performed at [facility], [address], [place], [place] [address]    Report Status 01/02/2022 FINAL  Final  Culture, Respiratory w Gram Stain     Status: None   Collection Time: 12/29/21  9:00 AM   Specimen: Tracheal Aspirate; Respiratory  Result Value Ref Range Status   Specimen Description TRACHEAL ASPIRATE  Final   Special Requests NONE  Final   Gram Stain   Final    MODERATE WBC PRESENT,BOTH PMN AND MONONUCLEAR NO ORGANISMS SEEN    Culture   Final    NO GROWTH 2 DAYS Performed at [facility], [address], [place], [place] [address]    Report Status 12/31/2021 FINAL  Final  Culture, blood (Routine X 2) w Reflex to ID Panel     Status: None   Collection Time: 12/29/21  9:58 AM   Specimen: BLOOD LEFT HAND  Result Value Ref Range Status   Specimen Description BLOOD LEFT HAND  Final   Special Requests   Final    BOTTLES DRAWN AEROBIC AND ANAEROBIC Blood Culture adequate volume   Culture   Final    NO GROWTH 5 DAYS Performed at [facility], [address] [place], [place] [address]    Report Status 01/03/2022 FINAL  Final  Culture, blood (Routine X 2) w Reflex to ID Panel     Status: None   Collection Time: 12/29/21  9:58 AM   Specimen: BLOOD LEFT ARM  Result Value Ref Range Status    Specimen Description BLOOD LEFT ARM  Final   Special Requests   Final    BOTTLES DRAWN AEROBIC AND ANAEROBIC Blood Culture results may not be optimal due to an inadequate volume of blood received in culture bottles   Culture   Final    NO GROWTH 5 DAYS Performed at [facility], 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  On vancomycin and cefepime and will continue the same for a projected 6 weeks total.    2.  Shock - back on some low dose levophed.    3.  Medicaiton monitoring - creat stable and wnl.  Will continue to monitor.   4.  Transaminitis - trending down.  Likely shock related.    5. 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 Worsening again.  I discussed with the patient and mother that it is not ideal to restart Humira at this time. May consider topical steroids if it worsens.  Not much to offer otherwise at this time with an ongoing infection.

## 2022-01-03 NOTE — Progress Notes (Signed)
NAME:  Donald [name], MRN:  [id number], DOB:  [birth year], LOS: 6 ADMISSION DATE:  12/27/2021, CONSULTATION DATE:  12/28/21 REFERRING MD:  Dr. 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 Gala Romney Afib MR s/p MV repair w/ resection of ruptured anterior papillary muscle and reconstruction of papillary chord and placement of annuloplasty ring 2019, but do to recurrent MR, underwent MVR w/ mechanical valve at [facility] 05/2021 Anemia Pyoderma gangrenosum  Significant Hospital Events: Including procedures, antibiotic start and stop dates in addition to other pertinent events   7/14 admitted FPTS for sepsis, N/V. Started on flagyl, cefepime, vanc 7/15 worsening shock> ICU> intubated, pressors, abx. Flagyl stopped. 7/16 decreasing milrinone for high CO and co-ox 7/19 extubated 7/21 drop in cardiac index, Levophed restarted , Swan discontinued   Interim History / Subjective:   Slept well overnight Diuresed 2.1 L with Lasix Remains on milrinone, low-dose Levophed, critically ill Afebrile  Objective   Blood pressure 105/78, pulse 74, temperature 98.8 F (37.1 C), resp. rate (!) 25, height 5\' 10"  (1.778 m), weight 92 kg, SpO2 99 %. PAP: (34-45)/(18-26) 42/24 CVP:  [0 mmHg-53 mmHg] 5 mmHg CO:  [4.2 L/min] 4.2 L/min CI:  [2 L/min/m2] 2 L/min/m2      Intake/Output Summary (Last 24 hours) at 01/03/2022 0730 Last data filed at 01/03/2022 0600 Gross per 24 hour  Intake 2945.5 ml  Output 2000 ml  Net 945.5 ml    Filed  Weights   01/01/22 0431 01/02/22 0500 01/03/22 0500  Weight: 93 kg 93.8 kg 92 kg    Examination:  General: Adult male of normal body habitus in NAD HEENT: [place]/AT, PERRL, MMM Neuro: Awake, interactive, nonfocal, follows commands PULM: Clear bilateral breath sounds GI: Soft, NT, ND. Hypoactive.  Extremities: normal bulk and tone, no edema.    Blood culture, 1 site 7/15: negative Blood culture,1 site 7/15: negative Blood cultures  7/16> NGTD  Labs show normal electrolytes, decreasing LFTs, decreasing leukocytosis, stable anemia Coox 59%  Resolved Hospital Problem list   Hyponatremia Hypoglycemia Acute respiratory failure with hypoxia requiring MV - Extubated 7/19  AKI   Assessment & Plan:    Shock, multifactorial septic & cardiogenic MV endocarditis, mechanical valve> bacteremia that would have been present on admission may not have been captured with so few blood cultures being obtained Biventricular HFrEF, acute on chronic   - Continue milrinone and norepinephrine. Daily Co-ox - Appreciate advanced heart failure team's management. - Using Tylenol sparingly for fevers.  BuSpar for shivering.  Please avoid NSAIDs. - Heparin infusion. Transition back to warfarin once liver function normalizes.  - Lasix 40 x 1 today, CVP 4  History of mitral valve replacement, now with acute prosthetic mitral valve endocarditis -Cefepime and vancomycin, plan for 6 weeks  Prolonged Qtc- chronic -Monitor electrolytes, replete as needed.  Avoid QTc prolonging medications.  MR s/p MVR mechanical valve on coumadin Subtherapeutic INR on admit -Continue heparin, holding warfarin -Trend INR.  Elevated liver enzymes due to sepsis, heart failure  Hepatomegaly on CT - Hold hepatotoxic medications.  Continue holding amiodarone. - Continue monitoring INR and LFTs.  Paroxysmal AF, currently in NSR -Continue telemetry monitoring. -Monitor electrolytes, replete as needed  At risk for  malnutrition -Swallow evaluation today advance oral  Mother and patient updated at bedside during rounds. Can discontinue Foley and Naval architect out of bed  Best Practice (right click and "Reselect all SmartList Selections" daily)   Diet/type: tubefeeds DVT prophylaxis: systemic heparin GI prophylaxis: PPI Lines: Central line and Arterial Line, swan to be removed.  Foley:  removal ordered  Code Status:  full code Last date of multidisciplinary goals of care discussion [per AHF 7/14]  Critical care time: 35 minutes  Cyril Mourning MD. FCCP. [facility] Pager : [phone]  If no response to pager , please call [phone] until 7 pm After 7:00 pm call Elink  [phone]     01/03/2022 7:30 AM

## 2022-01-03 NOTE — Progress Notes (Signed)
Initial Nutrition Assessment  DOCUMENTATION CODES:   Not applicable  INTERVENTION:   Downgrade to Dysphagia 3 (Mechanical Soft) per pt and family request after discussion post lunch meal today  Discussed nutrition poc with Mother and Patient and they agree to Ensure supplement during the day with nocturnal TF for now until appetite and po intake improves  Ensure Enlive po TID, each supplement provides 350 kcal and 20 grams of protein.  Cortrak: Change to Nocturnal TF  Vital 1.5 at 72 ml/hr x 14 hours (1000 mL) Pro-Source TF 45 mL TID This provides 1620 kcals, 101 g of protein and 760 mL of free water   NUTRITION DIAGNOSIS:   Inadequate oral intake related to acute illness as evidenced by NPO status.  Being addressed via diet advancement, supplements, nocturnal TF   GOAL:   Patient will meet greater than or equal to 90% of their needs  Progressing  MONITOR:   TF tolerance, Vent status, Labs, Weight trends  REASON FOR ASSESSMENT:   Ventilator, Consult Enteral/tube feeding initiation and management  ASSESSMENT:   36 yo male admitted with mixed shock-septic and cardiogenic, acute on chronic biventricular HF, acute respiratory failure requiring intubation, AKI, shock liver PMH includes CHF, mitral valve replacement, seronegative spondylitis and pyoderma gangrenosum  7/14 Admitted 7/15 ECHO LVEF 10%, severe biventricular failure, +vegetation on mechancial MVR, Intubated 7/16 Trickle TF initiated 7/17 TF titrated towards goal 7/19 Extubated, Cortrak placed, TF continued 7/21 Diet advanced to Heart Healthy  Pt sitting up in chair on visit, Mother at bedside. Pt remains on levophed and milrinone.  Pt evaluated by SLP today, diet advanced to Heart Healthy. Pt's first meal was lunch, RD able to round while pt finishing his lunch meal. Mother reporting pt had a hard time chewing the beef that was sent on visit today. Pt complaining that swallowing is challenging due to  throat pain post extubation. Discussed possibility of either chopping all meats or downgrading to Dysphagia 3 diet until pt feeling stronger and pain has lessened. Mother and pt desire Dysphagia 3 diet for now. RD to place orders  Pt ate some of the beef, angel food cake and bites of rice at lunch. Pt agreeable to trying Ensure supplements as the goal is able to wean him off TF and remove Cortrak once po intake adequate  Tolerating Vital 1.5 at 60 ml/hr via Cortrak  Elevated LFTs  +BM, no new wounds noted  Weight relatively stable  Labs: reviewed Meds: reviewed   Diet Order:   Diet Order             DIET DYS 3 Room service appropriate? Yes; Fluid consistency: Thin  Diet effective now                   EDUCATION NEEDS:   Not appropriate for education at this time  Skin:  Skin Assessment: Skin Integrity Issues: Skin Integrity Issues:: Other (Comment) Other: Chronic wounds related to hidradenitis suppurativa to  bilateral LE, buttocks  Last BM:  7/15  Height:   Ht Readings from Last 1 Encounters:  12/27/21 5\' 10"  (1.778 m)    Weight:   Wt Readings from Last 1 Encounters:  01/03/22 92 kg     BMI:  Body mass index is 29.1 kg/m.  Estimated Nutritional Needs:   Kcal:  2200-2400 kcals  Protein:  115-140 g  Fluid:  <1.8 L   01/05/22 MS, RDN, LDN, CNSC Registered Dietitian 3 Clinical Nutrition RD Pager and On-Call Pager  Number Located in [place]

## 2022-01-03 NOTE — TOC CM/SW Note (Signed)
HF TOC CM spoke to pt's mother and father at bedside. Made aware that [facility] has hold on admissions. Provided with medicare.gov list with ratings of IP rehab in [place], (placed copy on chart) for parents to review. Will send referral to IP rehab closer to their home in [place]. Mother agreeable also to SNF rehab at [facility]. Isidoro Donning RN3 CCM, Heart Failure TOC CM [phone]

## 2022-01-04 DIAGNOSIS — K529 Noninfective gastroenteritis and colitis, unspecified: Secondary | ICD-10-CM | POA: Diagnosis not present

## 2022-01-04 DIAGNOSIS — I330 Acute and subacute infective endocarditis: Secondary | ICD-10-CM | POA: Diagnosis not present

## 2022-01-04 DIAGNOSIS — A419 Sepsis, unspecified organism: Secondary | ICD-10-CM | POA: Diagnosis not present

## 2022-01-04 DIAGNOSIS — I5022 Chronic systolic (congestive) heart failure: Secondary | ICD-10-CM | POA: Diagnosis not present

## 2022-01-04 DIAGNOSIS — R6521 Severe sepsis with septic shock: Secondary | ICD-10-CM | POA: Diagnosis not present

## 2022-01-04 DIAGNOSIS — R570 Cardiogenic shock: Secondary | ICD-10-CM | POA: Diagnosis not present

## 2022-01-04 LAB — COOXEMETRY PANEL
Carboxyhemoglobin: 2 % — ABNORMAL HIGH (ref 0.5–1.5)
Carboxyhemoglobin: 2.1 % — ABNORMAL HIGH (ref 0.5–1.5)
Methemoglobin: 0.8 % (ref 0.0–1.5)
Methemoglobin: <0.7 % (ref 0.0–1.5)
O2 Saturation: 71.2 %
O2 Saturation: 74.7 %
Total hemoglobin: 9.5 g/dL — ABNORMAL LOW (ref 12.0–16.0)
Total hemoglobin: 10.1 g/dL — ABNORMAL LOW (ref 12.0–16.0)

## 2022-01-04 LAB — GLUCOSE, CAPILLARY
Glucose-Capillary: 115 mg/dL — ABNORMAL HIGH (ref 70–99)
Glucose-Capillary: 155 mg/dL — ABNORMAL HIGH (ref 70–99)
Glucose-Capillary: 116 mg/dL — ABNORMAL HIGH (ref 70–99)
Glucose-Capillary: 90 mg/dL (ref 70–99)
Glucose-Capillary: 111 mg/dL — ABNORMAL HIGH (ref 70–99)

## 2022-01-04 LAB — CBC
HCT: 28.8 % — ABNORMAL LOW (ref 39.0–52.0)
Hemoglobin: 9.3 g/dL — ABNORMAL LOW (ref 13.0–17.0)
MCH: 26.3 pg (ref 26.0–34.0)
MCHC: 32.3 g/dL (ref 30.0–36.0)
MCV: 81.4 fL (ref 80.0–100.0)
Platelets: 271 10*3/uL (ref 150–400)
RBC: 3.54 MIL/uL — ABNORMAL LOW (ref 4.22–5.81)
RDW: 16.5 % — ABNORMAL HIGH (ref 11.5–15.5)
WBC: 10.2 10*3/uL (ref 4.0–10.5)
nRBC: 0.2 % (ref 0.0–0.2)

## 2022-01-04 LAB — BASIC METABOLIC PANEL
Anion gap: 4 — ABNORMAL LOW (ref 5–15)
BUN: 14 mg/dL (ref 6–20)
CO2: 25 mmol/L (ref 22–32)
Calcium: 8.1 mg/dL — ABNORMAL LOW (ref 8.9–10.3)
Chloride: 104 mmol/L (ref 98–111)
Creatinine, Ser: 0.69 mg/dL (ref 0.61–1.24)
GFR, Estimated: >60 mL/min (ref >60)
Glucose, Bld: 130 mg/dL — ABNORMAL HIGH (ref 70–99)
Potassium: 3.6 mmol/L (ref 3.5–5.1)
Sodium: 133 mmol/L — ABNORMAL LOW (ref 135–145)

## 2022-01-04 LAB — HEPATIC FUNCTION PANEL
ALT: 826 U/L — ABNORMAL HIGH (ref 0–44)
AST: 910 U/L — ABNORMAL HIGH (ref 15–41)
Albumin: 2 g/dL — ABNORMAL LOW (ref 3.5–5.0)
Alkaline Phosphatase: 143 U/L — ABNORMAL HIGH (ref 38–126)
Bilirubin, Direct: 0.3 mg/dL — ABNORMAL HIGH (ref 0.0–0.2)
Indirect Bilirubin: 0.7 mg/dL (ref 0.3–0.9)
Total Bilirubin: 1 mg/dL (ref 0.3–1.2)
Total Protein: 7.3 g/dL (ref 6.5–8.1)

## 2022-01-04 LAB — PROTIME-INR
INR: 1.3 — ABNORMAL HIGH (ref 0.8–1.2)
Prothrombin Time: 15.6 seconds — ABNORMAL HIGH (ref 11.4–15.2)

## 2022-01-04 LAB — HEPARIN LEVEL (UNFRACTIONATED): Heparin Unfractionated: 0.66 IU/mL (ref 0.30–0.70)

## 2022-01-04 LAB — MAGNESIUM: Magnesium: 1.4 mg/dL — ABNORMAL LOW (ref 1.7–2.4)

## 2022-01-04 MED ORDER — WARFARIN SODIUM 2 MG PO TABS
4.0000 mg | ORAL_TABLET | Freq: Once | ORAL | Status: AC
Start: 2022-01-04 — End: 2022-01-04
  Administered 2022-01-04: 4 mg via ORAL
  Filled 2022-01-04: qty 2

## 2022-01-04 MED ORDER — MAGNESIUM SULFATE 4 GM/100ML IV SOLN
4.0000 g | Freq: Once | INTRAVENOUS | Status: AC
  Administered 2022-01-04: 4 g via INTRAVENOUS
  Filled 2022-01-04: qty 100

## 2022-01-04 MED ORDER — FUROSEMIDE 10 MG/ML IJ SOLN
80.0000 mg | Freq: Two times a day (BID) | INTRAMUSCULAR | Status: AC
Start: 2022-01-04 — End: 2022-01-04
  Administered 2022-01-04 (×2): 80 mg via INTRAVENOUS
  Filled 2022-01-04 (×2): qty 8

## 2022-01-04 MED ORDER — POTASSIUM CHLORIDE CRYS ER 20 MEQ PO TBCR
40.0000 meq | EXTENDED_RELEASE_TABLET | Freq: Two times a day (BID) | ORAL | Status: AC
  Administered 2022-01-04 (×2): 40 meq via ORAL
  Filled 2022-01-04 (×2): qty 2

## 2022-01-04 MED ORDER — AMIODARONE HCL 200 MG PO TABS
200.0000 mg | ORAL_TABLET | Freq: Every day | ORAL | Status: DC
Start: 2022-01-04 — End: 2022-01-18
  Administered 2022-01-04 – 2022-01-17 (×14): 200 mg via ORAL
  Filled 2022-01-04 (×14): qty 1

## 2022-01-04 MED ORDER — DIGOXIN 125 MCG PO TABS
0.1250 mg | ORAL_TABLET | Freq: Every day | ORAL | Status: DC
  Administered 2022-01-04 – 2022-01-17 (×14): 0.125 mg via ORAL
  Filled 2022-01-04 (×14): qty 1

## 2022-01-04 NOTE — Progress Notes (Signed)
Advanced Heart Failure Rounding Note   Subjective:   7/19 Extubated. Diuresed with IV lasix. I/O not accurate. Norepi weaned off.  7/20 Swan out   ID following. On vanc/cefepime for pMVR endocarditis. BCx NGTD. AF  Remains on milrinone 0.125 mcg+ Norep 4 mcg->CO-OX 71%  CVP 14  Getting stronger. Denies f/c. No CP or SOB. Worked with PT yesterday. THroat sore   Objective:     Vital Signs:   Temp:  [97.7 F (36.5 C)-98.8 F (37.1 C)] 98.2 F (36.8 C) (07/22 0334) Pulse Rate:  [70-79] 70 (07/22 0630) Resp:  [15-39] 37 (07/22 0630) BP: (111)/(86) 111/86 (07/21 1200) SpO2:  [88 %-100 %] 98 % (07/22 0630) Arterial Line BP: (91-138)/(52-109) 97/60 (07/22 0630) Weight:  [93.4 kg] 93.4 kg (07/22 0500) Last BM Date : 01/04/22  Weight change: Filed Weights   01/02/22 0500 01/03/22 0500 01/04/22 0500  Weight: 93.8 kg 92 kg 93.4 kg    Intake/Output:   Intake/Output Summary (Last 24 hours) at 01/04/2022 0909 Last data filed at 01/04/2022 0800 Gross per 24 hour  Intake 3005.07 ml  Output 1150 ml  Net 1855.07 ml   General:  Sitting up in bed. No resp difficulty HEENT: normal Neck: supple. JVP to ear Carotids 2+ bilat; no bruits. No lymphadenopathy or thryomegaly appreciated. Cor: PMI nondisplaced. Regular rate & rhythm. Mechanical s1 Lungs: clear Abdomen: soft, nontender, nondistended. No hepatosplenomegaly. No bruits or masses. Good bowel sounds. Extremities: no cyanosis, clubbing, rash, edema + pyoderma lesions Neuro: alert & orientedx3, cranial nerves grossly intact. moves all 4 extremities w/o difficulty. Affect pleasant  Telemetry: SR 70-80s Personally reviewed   Labs: Basic Metabolic Panel: Recent Labs  Lab 12/28/21 1034 12/28/21 1552 12/29/21 0323 12/29/21 0403 12/30/21 0150 12/30/21 1300 12/31/21 0323 01/01/22 0346 01/01/22 1200 01/01/22 2018 01/01/22 2227 01/02/22 0501 01/03/22 0345 01/04/22 0323  NA 131*   < > 135   < > 135 135 137 135   < >  138 139 135 138 133*  K 5.6*   < > 3.9   < > 4.1 3.8 3.3* 3.5   < > 3.8 3.7 3.9 4.0 3.6  CL 93*   < > 97*  --  102 102 104 109  --  105  --  104 107 104  CO2 9*   < > 19*  --  23 20* 22 23  --  23  --  23 28 25   GLUCOSE 159*   < > 107*  --  130* 114* 112* 116*  --  119*  --  125* 130* 130*  BUN 28*   < > 40*  --  31* 26* 22* 12  --  10  --  13 13 14   CREATININE 2.54*   < > 2.39*  --  1.33* 0.97 0.70 0.68  --  0.63  --  0.74 0.64 0.69  CALCIUM 8.4*   < > 8.1*  --  8.0* 7.7* 7.7* 7.2*  --  7.8*  --  7.7* 8.3* 8.1*  MG 2.4  --  1.6*  --  2.6*  --   --   --   --  1.5*  --   --   --  1.4*  PHOS  --   --   --    < > 1.7* 2.4* 1.7* 1.6*  --  1.7*  --  2.7  --   --    < > = values in this interval not displayed.  Liver Function Tests: Recent Labs  Lab 12/31/21 0323 01/01/22 0346 01/02/22 0501 01/03/22 0345 01/04/22 0323  AST 4,291* 4,151* 2,467* 1,352* 910*  ALT 2,229* 2,449* 1,680* 1,123* 826*  ALKPHOS 141* 140* 133* 122 143*  BILITOT 1.8* 1.4* 1.1 1.3* 1.0  PROT 7.3 6.8 7.5 6.9 7.3  ALBUMIN 1.9* 1.9* 2.0* 2.0* 2.0*    No results for input(s): "LIPASE", "AMYLASE" in the last 168 hours. Recent Labs  Lab 01/01/22 0930  AMMONIA 24     CBC: Recent Labs  Lab 01/01/22 0346 01/01/22 1200 01/01/22 1230 01/01/22 2227 01/02/22 0501 01/03/22 0345 01/04/22 0323  WBC 12.4*  --  11.8*  --  12.0* 11.3* 10.2  HGB 9.5*   < > 10.1* 10.9* 9.6* 9.5* 9.3*  HCT 29.1*   < > 30.6* 32.0* 29.5* 30.2* 28.8*  MCV 79.5*  --  77.9*  --  79.7* 80.7 81.4  PLT 166  --  194  --  200 242 271   < > = values in this interval not displayed.     Cardiac Enzymes: No results for input(s): "CKTOTAL", "CKMB", "CKMBINDEX", "TROPONINI" in the last 168 hours.  BNP: BNP (last 3 results) Recent Labs    07/24/21 1433 10/11/21 1230 12/27/21 1451  BNP 3,184.1* 400.8* >4,500.0*     ProBNP (last 3 results) No results for input(s): "PROBNP" in the last 8760 hours.    Other results:  Imaging: No  results found.   Medications:     Scheduled Medications:  Chlorhexidine Gluconate Cloth  6 each Topical Daily   feeding supplement  237 mL Oral TID BM   feeding supplement (PROSource TF)  45 mL Per Tube TID   feeding supplement (VITAL 1.5 CAL)  1,000 mL Per Tube Q24H   furosemide  80 mg Intravenous BID   midodrine  5 mg Per Tube TID WC   mouth rinse  15 mL Mouth Rinse 4 times per day   potassium chloride  40 mEq Oral BID   warfarin  4 mg Oral ONCE-1600   Warfarin - Pharmacist Dosing Inpatient   Does not apply q1600    Infusions:  sodium chloride 10 mL/hr at 01/04/22 0800   sodium chloride 10 mL/hr at 01/02/22 1441   sodium chloride Stopped (12/30/21 2208)   ceFEPime (MAXIPIME) IV Stopped (01/04/22 0551)   heparin 2,300 Units/hr (01/04/22 0800)   magnesium sulfate bolus IVPB     milrinone 0.125 mcg/kg/min (01/04/22 0800)   norepinephrine (LEVOPHED) Adult infusion 5 mcg/min (01/04/22 0800)   vancomycin Stopped (01/04/22 0419)    PRN Medications: sodium chloride, acetaminophen (TYLENOL) oral liquid 160 mg/5 mL, ipratropium-albuterol, loperamide HCl, mouth rinse, oxyCODONE   Assessment/Plan:   1. Mixed cardiogenic/septic shock - echo 12/28/21 LVEF 10% RV sev HK - TEE 7/15 severe biventricular failure. + vegetation on mechanical MVR - Bcx - NGTD - Respiratory culture w/ moderate WBCs (mononuclear and PMN) and no organisms  -Cultures no growth.  - ID following. Continue vanc/cefepime. Now AF - On milrinone 0.125+ Norepi 4 + midodrine 5 tid. Wean NE today. Keep SBP > 100  2. 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 Repeat echo as above.  - PTA Spironolactone, Farxiga, Digoxin and Losartan currently held in the setting of shock and his AKI.  - Echo this admit LVEF 10% RV severely HK.  - Long-term only option is transplant but not  sure he will  be candidate - CVP 14. Lasix 80 IV bis today - On Norepi 4 mcg + Milrinone 0.125 mcg. CO-OX 71%. Wean NE today. Follow co-ox and CVP - Off GDMT with pressors.  - Restart digoxin. Cleda Daub soon  - Supp K+and Mg   3. 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  - Underwent MVR with mechanical mitral valve in 05/[facility] at [facility].  - TEE 7/15 severe biventricular failure. + vegetation on mechanical MVR - Bcx pending. -> on vanc/cefepime - ID following. Appreciate assistance. - Continue heparin. INR 1.3. Start warfarin   4. Acute hypoxic respiratory failure - intubated 7/15 in setting of shock - Extubated 719  - Improved. CCM managing  5. Paroxysmal Atrial Fibrillation -On SR  - off amio with liver injury - does not tolerate AF - liver enzymes better. - restart amio 200 daily  5. AKI - Due to ATN/shock - Baseline creatinine 0.98 - 1.0.  - Peak 2.6. Resolved.   6. Shock liver - continue supportive care AST /ALT continue trend down.   7. Autoimmune Disorder  - He has seronegative spondylitis and pyoderma gangrenosum. Followed by Rheumatology as an outpatient.   8. Hypomagnesemia/hypokalemia - Supp   9. Deconditioning PT/OT following. CIR   CRITICAL CARE Performed by: Arvilla Meres  Total critical care time: 40 minutes  Critical care time was exclusive of separately billable procedures and treating other patients.  Critical care was necessary to treat or prevent imminent or life-threatening deterioration.  Critical care was time spent personally by me (independent of midlevel providers or residents) on the following activities: development of treatment plan with patient and/or surrogate as well as nursing, discussions with consultants, evaluation of patient's response to treatment, examination of  patient, obtaining history from patient or surrogate, ordering and performing treatments and interventions, ordering and review of laboratory studies, ordering and review of radiographic studies, pulse oximetry and re-evaluation of patient's condition.    Length of Stay: 7  Arvilla Meres, MD  9:14 AM   01/04/2022, 9:09 AM  Advanced Heart Failure Team Pager [phone] (M-F; 7a - 4p)  Please contact [facility] for night-coverage after hours (4p -7a ) and weekends on amion.

## 2022-01-04 NOTE — Progress Notes (Signed)
NAME:  Donald [name], MRN:  [id number], DOB:  12/14/1985, LOS: 7 ADMISSION DATE:  12/27/2021, CONSULTATION DATE:  12/28/21 REFERRING MD:  Dr. 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 Gala Romney Afib MR s/p MV repair w/ resection of ruptured anterior papillary muscle and reconstruction of papillary chord and placement of annuloplasty ring 2019, but do to recurrent MR, underwent MVR w/ mechanical valve at [facility] 05/2021 Anemia Pyoderma gangrenosum  Significant Hospital Events: Including procedures, antibiotic start and stop dates in addition to other pertinent events   7/14 admitted FPTS for sepsis, N/V. Started on flagyl, cefepime, vanc 7/15 worsening shock> ICU> intubated, pressors, abx. Flagyl stopped. 7/16 decreasing milrinone for high CO and co-ox 7/19 extubated 7/21 drop in cardiac index, Levophed restarted , Swan discontinued   Interim History / Subjective:   Remains critically ill on milrinone and Levophed drips. Afebrile CVP 13  Objective   Blood pressure 111/86, pulse 70, temperature 98.2 F (36.8 C), temperature source Oral, resp. rate (!) 37, height 5\' 10"  (1.778 m), weight 93.4 kg, SpO2 98 %. CVP:  [4 mmHg-80 mmHg] 7 mmHg      Intake/Output Summary (Last 24 hours) at 01/04/2022 0817 Last data filed at 01/04/2022 0600 Gross per 24 hour  Intake 2898.93 ml  Output 1150 ml  Net 1748.93 ml    Filed Weights   01/02/22 0500 01/03/22 0500 01/04/22 0500  Weight: 93.8 kg 92 kg 93.4 kg     Examination:  General: Adult male of normal body habitus in NAD HEENT: [place]/AT, PERRL, MMM Neuro: Awake, interactive, nonfocal, good strength all 4 extremities PULM: Clear bilateral breath sounds, no accessory muscle use GI: Soft, NT, ND. Hypoactive.  Extremities: normal bulk and tone, no edema.   Sacrum -open blisters sacrum, leg wounds are covered  Blood culture, 1 site 7/15: negative Blood culture,1 site 7/15: negative Blood cultures  7/16> NGTD  Labs show mild hyponatremia and hypokalemia, dysemia decreasing LFTs, decreasing leukocytosis, stable anemia Coox 71%  Resolved Hospital Problem list   Hyponatremia Hypoglycemia Acute respiratory failure with hypoxia requiring MV - Extubated 7/19  AKI   Assessment & Plan:    Shock, multifactorial septic & cardiogenic MV endocarditis, mechanical valve> bacteremia that would have been present on admission may not have been captured with so few blood cultures being obtained Biventricular HFrEF, acute on chronic   - Continue milrinone and norepinephrine. Daily Co-ox - Appreciate advanced heart failure team's management. - Using Tylenol sparingly for fevers.  Avoid NSAIDs. - Heparin infusion. Transition back to warfarin now that LFTs improving - Lasix 80 q 12  History of mitral valve replacement, now with acute prosthetic mitral valve endocarditis -Cefepime and vancomycin, plan for 6 weeks  Prolonged Qtc- chronic -  Avoid QTc prolonging medications. -Replete hypokalemia and hypomagnesemia with 4 gm  MR s/p MVR mechanical valve on coumadin Subtherapeutic INR on admit -Continue heparin + warfarin overlap -Trend INR.  Pyoderma gangrenosum /hidradenitis -Was on Humira, will have to hold until wound  is healed  Elevated liver enzymes due to sepsis, heart failure Hepatomegaly on CT - Hold hepatotoxic medications.  Continue holding amiodarone. - Continue monitoring INR and LFTs.  Paroxysmal AF, currently in NSR -Continue  telemetry  At risk for malnutrition -His oral intake is poor, so tube feeds were continued as nocturnal  Mother and patient updated at bedside during rounds. Can discontinue Foley and Naval architect out of bed  Best Practice (right click and "Reselect all SmartList Selections" daily)   Diet/type: tubefeeds DVT prophylaxis: systemic heparin GI prophylaxis: PPI Lines: Central line and Arterial Line, Foley:  removal ordered  Code Status:  full code Last date of multidisciplinary goals of care discussion [per AHF 7/14]  Critical care time: 31 minutes  Cyril Mourning MD. FCCP. [facility] Pager : [phone]  If no response to pager , please call [phone] until 7 pm After 7:00 pm call Elink  [phone]     01/04/2022 8:17 AM

## 2022-01-04 NOTE — Progress Notes (Signed)
    [facility]   Reason for visit: Follow up on MV endocarditis  Interval History: WBC wnl, afebrile.    Physical Exam: Constitutional:  Vitals:   01/04/22 0630 01/04/22 1120  BP:    Pulse: 70   Resp: (!) 37   Temp:  97.8 F (36.6 C)  SpO2: 98%    patient appears in NAD  Impression: prosthetic mitral valve endocarditis. No positive cultures.   Plan: 1.  Continue with vancomycin + cefepime, no changes.

## 2022-01-04 NOTE — Plan of Care (Signed)
Patient remains in CVICU overnight. Levophed has been weaned off for now. Patient remains on infusions of milrinone and heparin. No supplemental O2 requirement at this time. TF via Cortrak in addition to oral diet.   Problem: Education: Goal: Knowledge of General Education information will improve Description: Including pain rating scale, medication(s)/side effects and non-pharmacologic comfort measures 01/04/2022 2021 by Rosana Fret, RN Outcome: Progressing 01/04/2022 2021 by Rosana Fret, RN Outcome: Progressing   Problem: Health Behavior/Discharge Planning: Goal: Ability to manage health-related needs will improve 01/04/2022 2021 by Rosana Fret, RN Outcome: Progressing 01/04/2022 2021 by Rosana Fret, RN Outcome: Progressing   Problem: Clinical Measurements: Goal: Ability to maintain clinical measurements within normal limits will improve 01/04/2022 2021 by Rosana Fret, RN Outcome: Progressing 01/04/2022 2021 by Rosana Fret, RN Outcome: Progressing Goal: Will remain free from infection 01/04/2022 2021 by Rosana Fret, RN Outcome: Progressing 01/04/2022 2021 by Rosana Fret, RN Outcome: Progressing Goal: Diagnostic test results will improve 01/04/2022 2021 by Rosana Fret, RN Outcome: Progressing 01/04/2022 2021 by Rosana Fret, RN Outcome: Progressing Goal: Respiratory complications will improve 01/04/2022 2021 by Rosana Fret, RN Outcome: Progressing 01/04/2022 2021 by Rosana Fret, RN Outcome: Progressing Goal: Cardiovascular complication will be avoided 01/04/2022 2021 by Rosana Fret, RN Outcome: Progressing 01/04/2022 2021 by Rosana Fret, RN Outcome: Progressing   Problem: Activity: Goal: Risk for activity intolerance will decrease 01/04/2022 2021 by Rosana Fret, RN Outcome: Progressing 01/04/2022 2021 by Rosana Fret, RN Outcome: Progressing   Problem: Nutrition: Goal: Adequate nutrition will be maintained 01/04/2022  2021 by Rosana Fret, RN Outcome: Progressing 01/04/2022 2021 by Rosana Fret, RN Outcome: Progressing   Problem: Coping: Goal: Level of anxiety will decrease 01/04/2022 2021 by Rosana Fret, RN Outcome: Progressing 01/04/2022 2021 by Rosana Fret, RN Outcome: Progressing   Problem: Elimination: Goal: Will not experience complications related to bowel motility 01/04/2022 2021 by Rosana Fret, RN Outcome: Progressing 01/04/2022 2021 by Rosana Fret, RN Outcome: Progressing Goal: Will not experience complications related to urinary retention 01/04/2022 2021 by Rosana Fret, RN Outcome: Progressing 01/04/2022 2021 by Rosana Fret, RN Outcome: Progressing   Problem: Pain Managment: Goal: General experience of comfort will improve 01/04/2022 2021 by Rosana Fret, RN Outcome: Progressing 01/04/2022 2021 by Rosana Fret, RN Outcome: Progressing   Problem: Safety: Goal: Ability to remain free from injury will improve 01/04/2022 2021 by Rosana Fret, RN Outcome: Progressing 01/04/2022 2021 by Rosana Fret, RN Outcome: Progressing   Problem: Skin Integrity: Goal: Risk for impaired skin integrity will decrease 01/04/2022 2021 by Rosana Fret, RN Outcome: Progressing 01/04/2022 2021 by Rosana Fret, RN Outcome: Progressing   Problem: Education: Goal: Ability to demonstrate management of disease process will improve 01/04/2022 2021 by Rosana Fret, RN Outcome: Progressing 01/04/2022 2021 by Rosana Fret, RN Outcome: Progressing Goal: Ability to verbalize understanding of medication therapies will improve 01/04/2022 2021 by Rosana Fret, RN Outcome: Progressing 01/04/2022 2021 by Rosana Fret, RN Outcome: Progressing Goal: Individualized Educational Video(s) 01/04/2022 2021 by Rosana Fret, RN Outcome: Progressing 01/04/2022 2021 by Rosana Fret, RN Outcome: Progressing   Problem: Activity: Goal: Capacity to carry out  activities will improve 01/04/2022 2021 by Rosana Fret, RN Outcome: Progressing 01/04/2022 2021 by Rosana Fret, RN Outcome: Progressing   Problem: Cardiac: Goal: Ability to achieve and maintain adequate cardiopulmonary perfusion will  improve 01/04/2022 2021 by Rosana Fret, RN Outcome: Progressing 01/04/2022 2021 by Rosana Fret, RN Outcome: Progressing   Problem: Activity: Goal: Ability to tolerate increased activity will improve 01/04/2022 2021 by Rosana Fret, RN Outcome: Progressing 01/04/2022 2021 by Rosana Fret, RN Outcome: Progressing   Problem: Respiratory: Goal: Ability to maintain a clear airway and adequate ventilation will improve 01/04/2022 2021 by Rosana Fret, RN Outcome: Progressing 01/04/2022 2021 by Rosana Fret, RN Outcome: Progressing   Problem: Role Relationship: Goal: Method of communication will improve 01/04/2022 2021 by Rosana Fret, RN Outcome: Progressing 01/04/2022 2021 by Rosana Fret, RN Outcome: Progressing   Problem: Education: Goal: Ability to describe self-care measures that may prevent or decrease complications (Diabetes Survival Skills Education) will improve 01/04/2022 2021 by Rosana Fret, RN Outcome: Progressing 01/04/2022 2021 by Rosana Fret, RN Outcome: Progressing Goal: Individualized Educational Video(s) 01/04/2022 2021 by Rosana Fret, RN Outcome: Progressing 01/04/2022 2021 by Rosana Fret, RN Outcome: Progressing   Problem: Coping: Goal: Ability to adjust to condition or change in health will improve 01/04/2022 2021 by Rosana Fret, RN Outcome: Progressing 01/04/2022 2021 by Rosana Fret, RN Outcome: Progressing   Problem: Fluid Volume: Goal: Ability to maintain a balanced intake and output will improve 01/04/2022 2021 by Rosana Fret, RN Outcome: Progressing 01/04/2022 2021 by Rosana Fret, RN Outcome: Progressing   Problem: Health Behavior/Discharge Planning: Goal:  Ability to identify and utilize available resources and services will improve 01/04/2022 2021 by Rosana Fret, RN Outcome: Progressing 01/04/2022 2021 by Rosana Fret, RN Outcome: Progressing Goal: Ability to manage health-related needs will improve 01/04/2022 2021 by Rosana Fret, RN Outcome: Progressing 01/04/2022 2021 by Rosana Fret, RN Outcome: Progressing   Problem: Metabolic: Goal: Ability to maintain appropriate glucose levels will improve 01/04/2022 2021 by Rosana Fret, RN Outcome: Progressing 01/04/2022 2021 by Rosana Fret, RN Outcome: Progressing   Problem: Nutritional: Goal: Maintenance of adequate nutrition will improve 01/04/2022 2021 by Rosana Fret, RN Outcome: Progressing 01/04/2022 2021 by Rosana Fret, RN Outcome: Progressing Goal: Progress toward achieving an optimal weight will improve 01/04/2022 2021 by Rosana Fret, RN Outcome: Progressing 01/04/2022 2021 by Rosana Fret, RN Outcome: Progressing   Problem: Skin Integrity: Goal: Risk for impaired skin integrity will decrease 01/04/2022 2021 by Rosana Fret, RN Outcome: Progressing 01/04/2022 2021 by Rosana Fret, RN Outcome: Progressing   Problem: Tissue Perfusion: Goal: Adequacy of tissue perfusion will improve 01/04/2022 2021 by Rosana Fret, RN Outcome: Progressing 01/04/2022 2021 by Rosana Fret, RN Outcome: Progressing

## 2022-01-04 NOTE — Progress Notes (Signed)
ANTICOAGULATION CONSULT NOTE  Pharmacy Consult for heparin>>warfarin Indication:  MVR /AFib  No Known Allergies  Patient Measurements: Height: 5\' 10"  (177.8 cm) Weight: 93.4 kg (205 lb 14.6 oz) IBW/kg (Calculated) : 73 Heparin Dosing Weight: 90.7 kg  Vital Signs: Temp: 98.2 F (36.8 C) (07/22 0334) Temp Source: Oral (07/22 0334) Pulse Rate: 70 (07/22 0630)  Labs: Recent Labs    01/02/22 0501 01/02/22 1304 01/03/22 0345 01/04/22 0323  HGB 9.6*  --  9.5* 9.3*  HCT 29.5*  --  30.2* 28.8*  PLT 200  --  242 271  LABPROT 20.6*  --  17.1* 15.6*  INR 1.8*  --  1.4* 1.3*  HEPARINUNFRC 0.83* 0.73* 0.59 0.66  CREATININE 0.74  --  0.64 0.69     Estimated Creatinine Clearance: 150.8 mL/min (by C-G formula based on SCr of 0.69 mg/dL).   Medical History: Past Medical History:  Diagnosis Date   Anemia    Autoimmune disorder (HCC)    pyoderma gangrenosum   CHF (congestive heart failure) (HCC)    Chronic systolic heart failure (HCC)    a. EF 35-40% by echo in 07/2018 b. EF at 45% by repeat echo in 04/2020   DVT (deep venous thrombosis) (HCC)    h/o   Dysrhythmia    Mitral regurgitation    a. 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 b. 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HCC)    arthritis   Tricuspid regurgitation    Assessment: 7 yom with a history of mitral regurgitation s/p valve replacement on Coumadin, HF, AF and prior DVT, and Hx of hidradenitis and pyoderma gangrenosum . Patient presenting with weakness.  Patient taking warfarin pta. Home dose is 4mg  daily per last [facility] note. INR 1.4 on 7/10 instructed to take 6mg  that night and then continue 4mg  daily. Last taken 7/14.  Heparin level at goal at 0.66 on heparin 2300 units/hr. Warfarin  resumed 7/21 at 2 mg. INR down significantly to 1.3. LFTs also down. Will cautiously increase warfarin to home dose.   Goal of Therapy:  INR goal 2.5-3.5 Heparin level: 0.3-0.7 units/mL (targeting low end of goal range) Monitor platelets by anticoagulation protocol: Yes   Plan:  Continue heparin at 2300 units/hr Warfarin 4 mg tonight Daily INR, heparin level, and cbc  Thank you for allowing pharmacy to participate in this patient's care.  , PharmD PGY1 Pharmacy Resident 01/04/2022 7:08 AM Check [email or web address] for unit specific pharmacy number

## 2022-01-05 DIAGNOSIS — Z7901 Long term (current) use of anticoagulants: Secondary | ICD-10-CM | POA: Diagnosis not present

## 2022-01-05 DIAGNOSIS — T826XXA Infection and inflammatory reaction due to cardiac valve prosthesis, initial encounter: Secondary | ICD-10-CM | POA: Diagnosis not present

## 2022-01-05 DIAGNOSIS — I5022 Chronic systolic (congestive) heart failure: Secondary | ICD-10-CM | POA: Diagnosis not present

## 2022-01-05 DIAGNOSIS — R6521 Severe sepsis with septic shock: Secondary | ICD-10-CM | POA: Diagnosis not present

## 2022-01-05 DIAGNOSIS — R570 Cardiogenic shock: Secondary | ICD-10-CM | POA: Diagnosis not present

## 2022-01-05 DIAGNOSIS — I38 Endocarditis, valve unspecified: Secondary | ICD-10-CM | POA: Diagnosis not present

## 2022-01-05 DIAGNOSIS — L88 Pyoderma gangrenosum: Secondary | ICD-10-CM | POA: Diagnosis not present

## 2022-01-05 DIAGNOSIS — Z952 Presence of prosthetic heart valve: Secondary | ICD-10-CM | POA: Diagnosis not present

## 2022-01-05 DIAGNOSIS — I480 Paroxysmal atrial fibrillation: Secondary | ICD-10-CM | POA: Diagnosis not present

## 2022-01-05 DIAGNOSIS — I330 Acute and subacute infective endocarditis: Secondary | ICD-10-CM | POA: Diagnosis not present

## 2022-01-05 DIAGNOSIS — N179 Acute kidney failure, unspecified: Secondary | ICD-10-CM | POA: Diagnosis not present

## 2022-01-05 DIAGNOSIS — K529 Noninfective gastroenteritis and colitis, unspecified: Secondary | ICD-10-CM | POA: Diagnosis not present

## 2022-01-05 DIAGNOSIS — I429 Cardiomyopathy, unspecified: Secondary | ICD-10-CM | POA: Diagnosis not present

## 2022-01-05 DIAGNOSIS — A419 Sepsis, unspecified organism: Secondary | ICD-10-CM | POA: Diagnosis not present

## 2022-01-05 LAB — BASIC METABOLIC PANEL
Anion gap: 5 (ref 5–15)
BUN: 17 mg/dL (ref 6–20)
CO2: 28 mmol/L (ref 22–32)
Calcium: 8.5 mg/dL — ABNORMAL LOW (ref 8.9–10.3)
Chloride: 104 mmol/L (ref 98–111)
Creatinine, Ser: 0.74 mg/dL (ref 0.61–1.24)
GFR, Estimated: >60 mL/min (ref >60)
Glucose, Bld: 106 mg/dL — ABNORMAL HIGH (ref 70–99)
Potassium: 4.6 mmol/L (ref 3.5–5.1)
Sodium: 137 mmol/L (ref 135–145)

## 2022-01-05 LAB — PHOSPHORUS: Phosphorus: 3.3 mg/dL (ref 2.5–4.6)

## 2022-01-05 LAB — GLUCOSE, CAPILLARY
Glucose-Capillary: 107 mg/dL — ABNORMAL HIGH (ref 70–99)
Glucose-Capillary: 110 mg/dL — ABNORMAL HIGH (ref 70–99)
Glucose-Capillary: 117 mg/dL — ABNORMAL HIGH (ref 70–99)
Glucose-Capillary: 120 mg/dL — ABNORMAL HIGH (ref 70–99)
Glucose-Capillary: 93 mg/dL (ref 70–99)
Glucose-Capillary: 96 mg/dL (ref 70–99)

## 2022-01-05 LAB — PROTIME-INR
INR: 1.2 (ref 0.8–1.2)
Prothrombin Time: 15.1 seconds (ref 11.4–15.2)

## 2022-01-05 LAB — CBC
HCT: 29.3 % — ABNORMAL LOW (ref 39.0–52.0)
Hemoglobin: 9.4 g/dL — ABNORMAL LOW (ref 13.0–17.0)
MCH: 26 pg (ref 26.0–34.0)
MCHC: 32.1 g/dL (ref 30.0–36.0)
MCV: 80.9 fL (ref 80.0–100.0)
Platelets: 289 10*3/uL (ref 150–400)
RBC: 3.62 MIL/uL — ABNORMAL LOW (ref 4.22–5.81)
RDW: 17.2 % — ABNORMAL HIGH (ref 11.5–15.5)
WBC: 8.8 10*3/uL (ref 4.0–10.5)
nRBC: 0 % (ref 0.0–0.2)

## 2022-01-05 LAB — HEPARIN LEVEL (UNFRACTIONATED)
Heparin Unfractionated: 0.99 IU/mL — ABNORMAL HIGH (ref 0.30–0.70)
Heparin Unfractionated: 0.91 IU/mL — ABNORMAL HIGH (ref 0.30–0.70)
Heparin Unfractionated: 0.56 IU/mL (ref 0.30–0.70)

## 2022-01-05 LAB — COOXEMETRY PANEL
Carboxyhemoglobin: 2.2 % — ABNORMAL HIGH (ref 0.5–1.5)
Methemoglobin: 0.8 % (ref 0.0–1.5)
O2 Saturation: 68 %
Total hemoglobin: 9.9 g/dL — ABNORMAL LOW (ref 12.0–16.0)

## 2022-01-05 LAB — MAGNESIUM: Magnesium: 1.6 mg/dL — ABNORMAL LOW (ref 1.7–2.4)

## 2022-01-05 MED ORDER — MAGNESIUM SULFATE 4 GM/100ML IV SOLN
4.0000 g | Freq: Once | INTRAVENOUS | Status: AC
Start: 2022-01-05 — End: 2022-01-05
  Administered 2022-01-05: 4 g via INTRAVENOUS
  Filled 2022-01-05: qty 100

## 2022-01-05 MED ORDER — WARFARIN SODIUM 5 MG PO TABS
7.5000 mg | ORAL_TABLET | Freq: Once | ORAL | Status: AC
  Administered 2022-01-05: 7.5 mg via ORAL
  Filled 2022-01-05: qty 1

## 2022-01-05 NOTE — Progress Notes (Signed)
Advanced Heart Failure Rounding Note   Subjective:   7/19 Extubated. Diuresed with IV lasix. I/O not accurate. Norepi weaned off.  7/20 Swan out   ID following. On vanc/cefepime for pMVR endocarditis. BCx NGTD. Remains afebrile   Remains on milrinone 0.125 mcg and midodrine 5 tid.  NE off.  Co-ox 68%  Diuresed 7L with IV lasix. Weight down 5 pounds CVP 3-4  Feels better. Able to eat. Denies CP or SOB   Objective:     Vital Signs:   Temp:  [97.5 F (36.4 C)-98.2 F (36.8 C)] 98.2 F (36.8 C) (07/23 0738) Pulse Rate:  [74-82] 81 (07/23 0700) Resp:  [13-34] 18 (07/23 0700) BP: (92-108)/(62-79) 105/76 (07/23 0700) SpO2:  [96 %-100 %] 100 % (07/23 0700) Arterial Line BP: (98-120)/(60-80) 98/60 (07/22 1300) Weight:  [91 kg] 91 kg (07/23 0600) Last BM Date : 01/04/22  Weight change: Filed Weights   01/03/22 0500 01/04/22 0500 01/05/22 0600  Weight: 92 kg 93.4 kg 91 kg    Intake/Output:   Intake/Output Summary (Last 24 hours) at 01/05/2022 0834 Last data filed at 01/05/2022 0700 Gross per 24 hour  Intake 3914.69 ml  Output 7180 ml  Net -3265.31 ml   General:  Sitting up in chair. No respiratory difficulty HEENT: normal Neck: supple. no JVD. Carotids 2+ bilat; no bruits. No lymphadenopathy or thryomegaly appreciated. Cor: PMI nondisplaced. Regular rate & rhythm. Mechanical s1 Lungs: clear Abdomen: soft, nontender, nondistended. No hepatosplenomegaly. No bruits or masses. Good bowel sounds. Extremities: no cyanosis, clubbing, rash, edema Neuro: alert & orientedx3, cranial nerves grossly intact. moves all 4 extremities w/o difficulty. Affect pleasant   Telemetry: SR 70-80s Personally reviewed   Labs: Basic Metabolic Panel: Recent Labs  Lab 12/30/21 0150 12/30/21 1300 12/31/21 0323 01/01/22 0346 01/01/22 1200 01/01/22 2018 01/01/22 2227 01/02/22 0501 01/03/22 0345 01/04/22 0323 01/05/22 0342  NA 135   < > 137 135   < > 138 139 135 138 133* 137  K 4.1    < > 3.3* 3.5   < > 3.8 3.7 3.9 4.0 3.6 4.6  CL 102   < > 104 109  --  105  --  104 107 104 104  CO2 23   < > 22 23  --  23  --  23 28 25 28   GLUCOSE 130*   < > 112* 116*  --  119*  --  125* 130* 130* 106*  BUN 31*   < > 22* 12  --  10  --  13 13 14 17   CREATININE 1.33*   < > 0.70 0.68  --  0.63  --  0.74 0.64 0.69 0.74  CALCIUM 8.0*   < > 7.7* 7.2*  --  7.8*  --  7.7* 8.3* 8.1* 8.5*  MG 2.6*  --   --   --   --  1.5*  --   --   --  1.4* 1.6*  PHOS 1.7*   < > 1.7* 1.6*  --  1.7*  --  2.7  --   --  3.3   < > = values in this interval not displayed.     Liver Function Tests: Recent Labs  Lab 12/31/21 0323 01/01/22 0346 01/02/22 0501 01/03/22 0345 01/04/22 0323  AST 4,291* 4,151* 2,467* 1,352* 910*  ALT 2,229* 2,449* 1,680* 1,123* 826*  ALKPHOS 141* 140* 133* 122 143*  BILITOT 1.8* 1.4* 1.1 1.3* 1.0  PROT 7.3 6.8 7.5 6.9 7.3  ALBUMIN 1.9* 1.9* 2.0* 2.0* 2.0*    No results for input(s): "LIPASE", "AMYLASE" in the last 168 hours. Recent Labs  Lab 01/01/22 0930  AMMONIA 24     CBC: Recent Labs  Lab 01/01/22 1230 01/01/22 2227 01/02/22 0501 01/03/22 0345 01/04/22 0323 01/05/22 0342  WBC 11.8*  --  12.0* 11.3* 10.2 8.8  HGB 10.1* 10.9* 9.6* 9.5* 9.3* 9.4*  HCT 30.6* 32.0* 29.5* 30.2* 28.8* 29.3*  MCV 77.9*  --  79.7* 80.7 81.4 80.9  PLT 194  --  200 242 271 289     Cardiac Enzymes: No results for input(s): "CKTOTAL", "CKMB", "CKMBINDEX", "TROPONINI" in the last 168 hours.  BNP: BNP (last 3 results) Recent Labs    07/24/21 1433 10/11/21 1230 12/27/21 1451  BNP 3,184.1* 400.8* >4,500.0*     ProBNP (last 3 results) No results for input(s): "PROBNP" in the last 8760 hours.    Other results:  Imaging: No results found.   Medications:     Scheduled Medications:  amiodarone  200 mg Oral Daily   Chlorhexidine Gluconate Cloth  6 each Topical Daily   digoxin  0.125 mg Oral Daily   feeding supplement  237 mL Oral TID BM   feeding supplement  (PROSource TF)  45 mL Per Tube TID   feeding supplement (VITAL 1.5 CAL)  1,000 mL Per Tube Q24H   midodrine  5 mg Per Tube TID WC   mouth rinse  15 mL Mouth Rinse 4 times per day   Warfarin - Pharmacist Dosing Inpatient   Does not apply q1600    Infusions:  sodium chloride 10 mL/hr at 01/04/22 2100   sodium chloride Stopped (01/05/22 6720)   sodium chloride Stopped (12/30/21 2208)   ceFEPime (MAXIPIME) IV Stopped (01/05/22 0553)   heparin 2,000 Units/hr (01/05/22 0700)   magnesium sulfate bolus IVPB 50 mL/hr at 01/05/22 0700   milrinone 0.125 mcg/kg/min (01/05/22 0700)   norepinephrine (LEVOPHED) Adult infusion Stopped (01/04/22 1509)   vancomycin Stopped (01/05/22 0251)    PRN Medications: sodium chloride, acetaminophen (TYLENOL) oral liquid 160 mg/5 mL, ipratropium-albuterol, loperamide HCl, mouth rinse, oxyCODONE   Assessment/Plan:   1. Mixed cardiogenic/septic shock - echo 12/28/21 LVEF 10% RV sev HK - TEE 7/15 severe biventricular failure. + vegetation on mechanical MVR - Bcx - NGTD - Respiratory culture w/ moderate WBCs (mononuclear and PMN) and no organisms  -Cultures no growth.  - ID following. Continue vanc/cefepime. Now AF - On milrinone 0.125 + midodrine 5 tid. NE off Stop milrinone today  2. 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 Repeat echo as above.  - PTA Spironolactone, Farxiga, Digoxin and Losartan currently held in the setting of shock and his AKI.  - Echo this admit LVEF 10% RV severely HK.  - Long-term only option is transplant but not sure he will be candidate - CVP 3-4 No diurretics today - Off NE. On Milrinone 0.125 mcg. CO-OX 68%. Stop milrinone.  - Off GDMT with pressors.  - Continue digoxin - Restart sprio tomorrow +/- SGLT2i   3. History of mechanical MVR with acute prosthetic MV endocarditis - He is s/p MV  repair with resection of ruptured anterior papillary muscle and reconstruction of papillary chord and placement of annuloplasty ring in 2019.  - Underwent MVR with mechanical mitral valve in 05/[facility] at [facility].  - TEE 7/15 severe biventricular failure. +  vegetation on mechanical MVR - Bcx pending. -> on vanc/cefepime - ID following. Appreciate assistance. - Continue heparin. INR 1.2  Continue warfarin   4. Acute hypoxic respiratory failure - intubated 7/15 in setting of shock - Extubated 719  - Improved. CCM managing  5. Paroxysmal Atrial Fibrillation -On SR  - off amio with liver injury - does not tolerate AF - liver enzymes better. - restart amio 200 daily  5. AKI - Due to ATN/shock - Baseline creatinine 0.98 - 1.0.  - Peak 2.6. Resolved.   6. Shock liver - continue supportive care AST /ALT continue trend down.   7. Autoimmune Disorder  - He has seronegative spondylitis and pyoderma gangrenosum. Followed by Rheumatology as an outpatient.   8. Hypomagnesemia/hypokalemia - Supp - Will give Mg 4g   9. Deconditioning PT/OT following. CIR   Ok to go to floor.   Length of Stay: 8  Arvilla Meres, MD  8:34 AM   01/05/2022, 8:34 AM  Advanced Heart Failure Team Pager [phone] (M-F; 7a - 4p)  Please contact [facility] for night-coverage after hours (4p -7a ) and weekends on amion.

## 2022-01-05 NOTE — Progress Notes (Signed)
ANTICOAGULATION CONSULT NOTE - Follow Up Consult  Pharmacy Consult for heparin Indication:  MVR/AFib  Labs: Recent Labs    01/03/22 0345 01/04/22 0323 01/05/22 0342  HGB 9.5* 9.3* 9.4*  HCT 30.2* 28.8* 29.3*  PLT 242 271 289  LABPROT 17.1* 15.6* 15.1  INR 1.4* 1.3* 1.2  HEPARINUNFRC 0.59 0.66 0.99*  CREATININE 0.64 0.69 0.74    Assessment: 35yo male supratherapeutic on heparin after two levels at goal though had been trending up; no infusion issues or signs of bleeding per RN.  Goal of Therapy:  Heparin level 0.3-0.7 units/ml   Plan:  Will decrease heparin infusion by 3 units/kg/hr to 2000 units/hr and check level in 6 hours.    Vb  01/05/2022,4:36 AM

## 2022-01-05 NOTE — Progress Notes (Signed)
ANTICOAGULATION CONSULT NOTE  Pharmacy Consult for heparin>>warfarin Indication:  MVR /AFib  No Known Allergies  Patient Measurements: Height: 5\' 10"  (177.8 cm) Weight: 91 kg (200 lb 11.2 oz) IBW/kg (Calculated) : 73 Heparin Dosing Weight: 90.7 kg  Vital Signs: Temp: 98.5 F (36.9 C) (07/23 1629) Temp Source: Oral (07/23 1629) BP: 116/84 (07/23 2100) Pulse Rate: 82 (07/23 2100)  Labs: Recent Labs    01/03/22 0345 01/04/22 0323 01/05/22 0342 01/05/22 1235 01/05/22 2110  HGB 9.5* 9.3* 9.4*  --   --   HCT 30.2* 28.8* 29.3*  --   --   PLT 242 271 289  --   --   LABPROT 17.1* 15.6* 15.1  --   --   INR 1.4* 1.3* 1.2  --   --   HEPARINUNFRC 0.59 0.66 0.99* 0.91* 0.56  CREATININE 0.64 0.69 0.74  --   --      Estimated Creatinine Clearance: 149 mL/min (by C-G formula based on SCr of 0.74 mg/dL).   Medical History: Past Medical History:  Diagnosis Date   Anemia    Autoimmune disorder (HCC)    pyoderma gangrenosum   CHF (congestive heart failure) (HCC)    Chronic systolic heart failure (HCC)    a. EF 35-40% by echo in 07/2018 b. EF at 45% by repeat echo in 04/2020   DVT (deep venous thrombosis) (HCC)    h/o   Dysrhythmia    Mitral regurgitation    a. 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 b. 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HCC)    arthritis   Tricuspid regurgitation    Assessment: 35 yom with a history of mitral regurgitation s/p valve replacement on Coumadin, HF, AF and prior DVT, and Hx of hidradenitis and pyoderma gangrenosum.  Patient taking warfarin pta. Home dose is 4mg  daily per last [facility] note. INR 1.4 on 7/10 instructed to take 6mg  that night and then continue 4mg  daily. Last taken 7/14.  Heparin level therapeutic at 0.56.  Per RN, no issues with infusion or bleeding from IV site. CBC stable. Most recent INR was subtherapeutic at 1.2, warfarin 7.5 mg x1 given today.   Goal of Therapy:  INR goal 2.5-3.5 Heparin level: 0.3-0.7 units/mL (targeting low end of goal range) Monitor platelets by anticoagulation protocol: Yes   Plan:  Continue heparin gtt at 1750 units/hr F/u daily INR in AM Check daily heparin level and CBC Monitor for s/sx of bleeding  Thank you for allowing pharmacy to participate in this patient's care.  9/10, PharmD, [facility] PGY1 Pharmacy Resident 01/05/2022 10:03 PM

## 2022-01-05 NOTE — Progress Notes (Signed)
ANTICOAGULATION CONSULT NOTE  Pharmacy Consult for heparin>>warfarin Indication:  MVR /AFib  No Known Allergies  Patient Measurements: Height: 5\' 10"  (177.8 cm) Weight: 91 kg (200 lb 11.2 oz) IBW/kg (Calculated) : 73 Heparin Dosing Weight: 90.7 kg  Vital Signs: Temp: 98.2 F (36.8 C) (07/23 0738) Temp Source: Oral (07/23 0738) BP: 105/76 (07/23 0700) Pulse Rate: 81 (07/23 0700)  Labs: Recent Labs    01/03/22 0345 01/04/22 0323 01/05/22 0342 01/05/22 1235  HGB 9.5* 9.3* 9.4*  --   HCT 30.2* 28.8* 29.3*  --   PLT 242 271 289  --   LABPROT 17.1* 15.6* 15.1  --   INR 1.4* 1.3* 1.2  --   HEPARINUNFRC 0.59 0.66 0.99* 0.91*  CREATININE 0.64 0.69 0.74  --      Estimated Creatinine Clearance: 149 mL/min (by C-G formula based on SCr of 0.74 mg/dL).   Medical History: Past Medical History:  Diagnosis Date   Anemia    Autoimmune disorder (HCC)    pyoderma gangrenosum   CHF (congestive heart failure) (HCC)    Chronic systolic heart failure (HCC)    a. EF 35-40% by echo in 07/2018 b. EF at 45% by repeat echo in 04/2020   DVT (deep venous thrombosis) (HCC)    h/o   Dysrhythmia    Mitral regurgitation    a. 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 b. 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HCC)    arthritis   Tricuspid regurgitation    Assessment: 5 yom with a history of mitral regurgitation s/p valve replacement on Coumadin, HF, AF and prior DVT, and Hx of hidradenitis and pyoderma gangrenosum .   Patient taking warfarin pta. Home dose is 4mg  daily per last [facility] note. INR 1.4 on 7/10 instructed to take 6mg  that night and then continue 4mg  daily. Last taken 7/14.  Heparin level above goal at 0.91 on heparin 2300 units/hr. Warfarin resumed 7/21  at 2 mg. INR down significantly today after resuming home to 1.2. LFTs also down. Will decrease heparin gtt and give boosted dose of warfarin with declining INR. Hg 9.4 and plts stable.  Goal of Therapy:  INR goal 2.5-3.5 Heparin level: 0.3-0.7 units/mL (targeting low end of goal range) Monitor platelets by anticoagulation protocol: Yes   Plan:  Decrease heparin to 1750 units/hr Warfarin 7.5 mg tonight Daily INR, heparin level, and cbc  Thank you for allowing pharmacy to participate in this patient's care.  , PharmD PGY1 Pharmacy Resident 01/05/2022 1:51 PM Check [email or web address] for unit specific pharmacy number

## 2022-01-05 NOTE — Progress Notes (Signed)
NAME:  Donald [name], MRN:  [id number], DOB:  [birth year], LOS: 8 ADMISSION DATE:  12/27/2021, CONSULTATION DATE:  12/28/21 REFERRING MD:  Dr. 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 Gala Romney Afib MR s/p MV repair w/ resection of ruptured anterior papillary muscle and reconstruction of papillary chord and placement of annuloplasty ring 2019, but do to recurrent MR, underwent MVR w/ mechanical valve at [facility] 05/2021 Anemia Pyoderma gangrenosum  Significant Hospital Events: Including procedures, antibiotic start and stop dates in addition to other pertinent events   7/14 admitted FPTS for sepsis, N/V. Started on flagyl, cefepime, vanc 7/15 worsening shock> ICU> intubated, pressors, abx. Flagyl stopped. 7/16 decreasing milrinone for high CO and co-ox 7/19 extubated 7/21 drop in cardiac index, Levophed restarted , Swan discontinued   Interim History / Subjective:   Off Levophed since 2 PM yesterday. Remains on milrinone Diuresed 4 L with Lasix Objective   Blood pressure 105/76, pulse 81, temperature 98.2 F (36.8 C), temperature source Oral, resp. rate 18, height 5\' 10"  (1.778 m), weight 91 kg, SpO2 100 %. CVP:  [0 mmHg-15 mmHg] 1 mmHg      Intake/Output Summary (Last 24 hours) at 01/05/2022 0834 Last data filed at 01/05/2022 0700 Gross per 24 hour  Intake 3914.69 ml  Output 7180 ml  Net -3265.31 ml    Filed Weights   01/03/22 0500 01/04/22 0500 01/05/22 0600  Weight: 92 kg 93.4 kg 91 kg     Examination:  General: Adult male of normal body habitus in NAD HEENT: [place]/AT, PERRL, MMM Neuro: Awake, interactive, nonfocal PULM: No accessory muscle use, clear breath sounds bilateral GI: Soft, NT, ND. Hypoactive.  Extremities: normal bulk and tone, no edema.   Sacrum -open blisters sacrum, LLE leg wound covered  Blood culture, 1 site 7/15: negative Blood culture,1 site 7/15: negative Blood cultures  7/16> NGTD  Labs show improved sodium and potassium, mild hypomagnesemia, no leukocytosis Coox 68%  Resolved Hospital Problem list   Hyponatremia Hypoglycemia Acute respiratory failure with hypoxia requiring MV - Extubated 7/19  AKI   Assessment & Plan:    Shock, multifactorial septic & cardiogenic MV endocarditis, mechanical valve> bacteremia that would have been present on admission may not have been captured with so few blood cultures being obtained Biventricular HFrEF, acute on chronic   -Consider DC milrinone and repeat coags - Appreciate advanced heart failure team's management. - Using Tylenol sparingly for fevers.  Avoid NSAIDs. - Lasix per CHF service  History of mitral valve replacement, now with acute prosthetic mitral valve endocarditis -Cefepime and vancomycin, plan for 6 weeks -Will need PICC line soon  Prolonged Qtc- chronic -  Avoid QTc prolonging medications. -Replete hypomagnesemia   MR s/p MVR mechanical valve on coumadin Subtherapeutic INR on admit -Continue heparin + warfarin overlap , being dosed by pharmacy -Trend INR.  Pyoderma gangrenosum /hidradenitis -Was on Humira, will have to hold until wound is healed  Elevated liver enzymes due to sepsis, heart failure, trending down  Hepatomegaly on CT - Hold hepatotoxic medications.   -Check LFTs intermittently  Paroxysmal AF, currently in NSR -Continue telemetry -Amio oral  At risk for malnutrition -Can discontinue tube feeds and core track once oral intake improves  Can transfer to  PCU and to [facility] 7/24 PT following, may be a candidate for CIR  Best Practice (right click and "Reselect all SmartList Selections" daily)   Diet/type: tubefeeds DVT prophylaxis: systemic heparin GI prophylaxis: PPI Lines: Central line, Foley:  removal ordered  Code Status:  full code Last date of multidisciplinary goals of care discussion [per AHF 7/14]   Cyril Mourning MD. [facility]. [place] [facility] Pager : [phone]  If no response to pager , please call [phone] until 7 pm After 7:00 pm call Elink  [phone]     01/05/2022 8:34 AM

## 2022-01-06 ENCOUNTER — Telehealth (HOSPITAL_COMMUNITY): Payer: Self-pay

## 2022-01-06 ENCOUNTER — Inpatient Hospital Stay: Payer: Self-pay

## 2022-01-06 DIAGNOSIS — I480 Paroxysmal atrial fibrillation: Secondary | ICD-10-CM | POA: Diagnosis not present

## 2022-01-06 DIAGNOSIS — N179 Acute kidney failure, unspecified: Secondary | ICD-10-CM | POA: Diagnosis not present

## 2022-01-06 DIAGNOSIS — R570 Cardiogenic shock: Secondary | ICD-10-CM | POA: Diagnosis not present

## 2022-01-06 DIAGNOSIS — K529 Noninfective gastroenteritis and colitis, unspecified: Secondary | ICD-10-CM | POA: Diagnosis not present

## 2022-01-06 DIAGNOSIS — I429 Cardiomyopathy, unspecified: Secondary | ICD-10-CM | POA: Diagnosis not present

## 2022-01-06 DIAGNOSIS — T826XXA Infection and inflammatory reaction due to cardiac valve prosthesis, initial encounter: Secondary | ICD-10-CM | POA: Diagnosis not present

## 2022-01-06 DIAGNOSIS — I38 Endocarditis, valve unspecified: Secondary | ICD-10-CM | POA: Diagnosis not present

## 2022-01-06 DIAGNOSIS — I5022 Chronic systolic (congestive) heart failure: Secondary | ICD-10-CM | POA: Diagnosis not present

## 2022-01-06 DIAGNOSIS — Z952 Presence of prosthetic heart valve: Secondary | ICD-10-CM | POA: Diagnosis not present

## 2022-01-06 DIAGNOSIS — L88 Pyoderma gangrenosum: Secondary | ICD-10-CM | POA: Diagnosis not present

## 2022-01-06 DIAGNOSIS — Z7901 Long term (current) use of anticoagulants: Secondary | ICD-10-CM | POA: Diagnosis not present

## 2022-01-06 LAB — HEPATIC FUNCTION PANEL
ALT: 498 U/L — ABNORMAL HIGH (ref 0–44)
AST: 407 U/L — ABNORMAL HIGH (ref 15–41)
Albumin: 2.2 g/dL — ABNORMAL LOW (ref 3.5–5.0)
Alkaline Phosphatase: 115 U/L (ref 38–126)
Bilirubin, Direct: 0.2 mg/dL (ref 0.0–0.2)
Indirect Bilirubin: 0.8 mg/dL (ref 0.3–0.9)
Total Bilirubin: 1 mg/dL (ref 0.3–1.2)
Total Protein: 7.6 g/dL (ref 6.5–8.1)

## 2022-01-06 LAB — BASIC METABOLIC PANEL
Anion gap: 5 (ref 5–15)
BUN: 17 mg/dL (ref 6–20)
CO2: 25 mmol/L (ref 22–32)
Calcium: 8.6 mg/dL — ABNORMAL LOW (ref 8.9–10.3)
Chloride: 104 mmol/L (ref 98–111)
Creatinine, Ser: 0.75 mg/dL (ref 0.61–1.24)
GFR, Estimated: >60 mL/min (ref >60)
Glucose, Bld: 86 mg/dL (ref 70–99)
Potassium: 4.3 mmol/L (ref 3.5–5.1)
Sodium: 134 mmol/L — ABNORMAL LOW (ref 135–145)

## 2022-01-06 LAB — CBC
HCT: 28.2 % — ABNORMAL LOW (ref 39.0–52.0)
Hemoglobin: 8.9 g/dL — ABNORMAL LOW (ref 13.0–17.0)
MCH: 25.9 pg — ABNORMAL LOW (ref 26.0–34.0)
MCHC: 31.6 g/dL (ref 30.0–36.0)
MCV: 82 fL (ref 80.0–100.0)
Platelets: 303 10*3/uL (ref 150–400)
RBC: 3.44 MIL/uL — ABNORMAL LOW (ref 4.22–5.81)
RDW: 17.7 % — ABNORMAL HIGH (ref 11.5–15.5)
WBC: 9.1 10*3/uL (ref 4.0–10.5)
nRBC: 0 % (ref 0.0–0.2)

## 2022-01-06 LAB — PROTIME-INR
INR: 1.3 — ABNORMAL HIGH (ref 0.8–1.2)
Prothrombin Time: 15.6 seconds — ABNORMAL HIGH (ref 11.4–15.2)

## 2022-01-06 LAB — GLUCOSE, CAPILLARY
Glucose-Capillary: 100 mg/dL — ABNORMAL HIGH (ref 70–99)
Glucose-Capillary: 100 mg/dL — ABNORMAL HIGH (ref 70–99)
Glucose-Capillary: 106 mg/dL — ABNORMAL HIGH (ref 70–99)
Glucose-Capillary: 131 mg/dL — ABNORMAL HIGH (ref 70–99)
Glucose-Capillary: 74 mg/dL (ref 70–99)
Glucose-Capillary: 80 mg/dL (ref 70–99)
Glucose-Capillary: 84 mg/dL (ref 70–99)

## 2022-01-06 LAB — COOXEMETRY PANEL
Carboxyhemoglobin: 2.4 % — ABNORMAL HIGH (ref 0.5–1.5)
Methemoglobin: 0.8 % (ref 0.0–1.5)
O2 Saturation: 66.9 %
Total hemoglobin: 9.5 g/dL — ABNORMAL LOW (ref 12.0–16.0)

## 2022-01-06 LAB — MAGNESIUM: Magnesium: 1.7 mg/dL (ref 1.7–2.4)

## 2022-01-06 LAB — HEPARIN LEVEL (UNFRACTIONATED): Heparin Unfractionated: 0.54 IU/mL (ref 0.30–0.70)

## 2022-01-06 MED ORDER — MAGNESIUM SULFATE 4 GM/100ML IV SOLN
4.0000 g | Freq: Once | INTRAVENOUS | Status: AC
  Administered 2022-01-06: 4 g via INTRAVENOUS
  Filled 2022-01-06: qty 100

## 2022-01-06 MED ORDER — OXYCODONE HCL 5 MG/5ML PO SOLN: 2.5000 mg | ORAL | Status: DC | PRN

## 2022-01-06 MED ORDER — DAPAGLIFLOZIN PROPANEDIOL 10 MG PO TABS
10.0000 mg | ORAL_TABLET | Freq: Every day | ORAL | Status: DC
  Administered 2022-01-06 – 2022-01-17 (×12): 10 mg via ORAL
  Filled 2022-01-06 (×13): qty 1

## 2022-01-06 MED ORDER — ORAL CARE MOUTH RINSE: 15.0000 mL | OROMUCOSAL | Status: DC | PRN

## 2022-01-06 MED ORDER — FUROSEMIDE 40 MG PO TABS
40.0000 mg | ORAL_TABLET | Freq: Every day | ORAL | Status: DC
  Administered 2022-01-06: 40 mg via ORAL
  Filled 2022-01-06: qty 1

## 2022-01-06 MED ORDER — WARFARIN SODIUM 5 MG PO TABS
7.5000 mg | ORAL_TABLET | Freq: Once | ORAL | Status: AC
  Administered 2022-01-06: 7.5 mg via ORAL
  Filled 2022-01-06: qty 1

## 2022-01-06 MED ORDER — SODIUM CHLORIDE 0.9 % IV SOLN: INTRAVENOUS | Status: DC | PRN

## 2022-01-06 MED ORDER — ACETAMINOPHEN 160 MG/5ML PO SOLN
650.0000 mg | Freq: Four times a day (QID) | ORAL | Status: DC | PRN
Start: 2022-01-06 — End: 2022-01-09

## 2022-01-06 NOTE — Plan of Care (Signed)
  Problem: Health Behavior/Discharge Planning: Goal: Ability to manage health-related needs will improve Outcome: Progressing   Problem: Clinical Measurements: Goal: Ability to maintain clinical measurements within normal limits will improve Outcome: Progressing Goal: Will remain free from infection Outcome: Progressing Goal: Diagnostic test results will improve Outcome: Progressing   Problem: Skin Integrity: Goal: Risk for impaired skin integrity will decrease Outcome: Progressing   Problem: Education: Goal: Ability to demonstrate management of disease process will improve Outcome: Progressing Goal: Ability to verbalize understanding of medication therapies will improve Outcome: Progressing Goal: Individualized Educational Video(s) Outcome: Progressing   Problem: Education: Goal: Ability to describe self-care measures that may prevent or decrease complications (Diabetes Survival Skills Education) will improve Outcome: Progressing Goal: Individualized Educational Video(s) Outcome: Progressing   Problem: Fluid Volume: Goal: Ability to maintain a balanced intake and output will improve Outcome: Progressing   Problem: Health Behavior/Discharge Planning: Goal: Ability to identify and utilize available resources and services will improve Outcome: Progressing Goal: Ability to manage health-related needs will improve Outcome: Progressing   Problem: Skin Integrity: Goal: Risk for impaired skin integrity will decrease Outcome: Progressing   Problem: Tissue Perfusion: Goal: Adequacy of tissue perfusion will improve Outcome: Progressing

## 2022-01-06 NOTE — Progress Notes (Addendum)
Advanced Heart Failure Rounding Note   Subjective:   7/19 Extubated. Diuresed with IV lasix. I/O not accurate. Norepi weaned off.  7/20 Swan out  7/23 Milrinone off  ID following. On vanc/cefepime for pMVR endocarditis. BCx NGTD. Remains afebrile   CO-OX 67% off inotropes.  CVP 9.   MAPs 70s-80s  Feeling well. No complaints. Denies dyspnea, orthopnea or PND. RN reports he has been more alert.   Objective:     Vital Signs:   Temp:  [98.2 F (36.8 C)-98.5 F (36.9 C)] 98.5 F (36.9 C) (07/24 0400) Pulse Rate:  [69-82] 72 (07/24 0700) Resp:  [14-36] 24 (07/24 0700) BP: (91-116)/(61-86) 99/66 (07/24 0700) SpO2:  [95 %-100 %] 96 % (07/24 0700) Weight:  [88.5 kg] 88.5 kg (07/24 0500) Last BM Date : 01/05/22  Weight change: Filed Weights   01/04/22 0500 01/05/22 0600 01/06/22 0500  Weight: 93.4 kg 91 kg 88.5 kg    Intake/Output:   Intake/Output Summary (Last 24 hours) at 01/06/2022 0733 Last data filed at 01/06/2022 0700 Gross per 24 hour  Intake 2008.52 ml  Output 2050 ml  Net -41.48 ml   General:  Sitting up in bed. No distress. HEENT: normal Neck: supple. CVP 8-10. Carotids 2+ bilat; no bruits.  Cor: PMI nondisplaced. Regular rate & rhythm. No rubs, gallops or murmurs. Lungs: clear Abdomen: soft, nontender, nondistended.  Extremities: no cyanosis, clubbing, rash, edema Neuro: alert & orientedx3, cranial nerves grossly intact. moves all 4 extremities w/o difficulty. Affect pleasant    Telemetry: SR 70s   Labs: Basic Metabolic Panel: Recent Labs  Lab 12/31/21 0323 01/01/22 0346 01/01/22 1200 01/01/22 2018 01/01/22 2227 01/02/22 0501 01/03/22 0345 01/04/22 0323 01/05/22 0342 01/06/22 0409  NA 137 135   < > 138   < > 135 138 133* 137 134*  K 3.3* 3.5   < > 3.8   < > 3.9 4.0 3.6 4.6 4.3  CL 104 109  --  105  --  104 107 104 104 104  CO2 22 23  --  23  --  23 28 25 28 25   GLUCOSE 112* 116*  --  119*  --  125* 130* 130* 106* 86  BUN 22* 12   --  10  --  13 13 14 17 17   CREATININE 0.70 0.68  --  0.63  --  0.74 0.64 0.69 0.74 0.75  CALCIUM 7.7* 7.2*  --  7.8*  --  7.7* 8.3* 8.1* 8.5* 8.6*  MG  --   --   --  1.5*  --   --   --  1.4* 1.6* 1.7  PHOS 1.7* 1.6*  --  1.7*  --  2.7  --   --  3.3  --    < > = values in this interval not displayed.    Liver Function Tests: Recent Labs  Lab 01/01/22 0346 01/02/22 0501 01/03/22 0345 01/04/22 0323 01/06/22 0409  AST 4,151* 2,467* 1,352* 910* 407*  ALT 2,449* 1,680* 1,123* 826* 498*  ALKPHOS 140* 133* 122 143* 115  BILITOT 1.4* 1.1 1.3* 1.0 1.0  PROT 6.8 7.5 6.9 7.3 7.6  ALBUMIN 1.9* 2.0* 2.0* 2.0* 2.2*   No results for input(s): "LIPASE", "AMYLASE" in the last 168 hours. Recent Labs  Lab 01/01/22 0930  AMMONIA 24    CBC: Recent Labs  Lab 01/02/22 0501 01/03/22 0345 01/04/22 0323 01/05/22 0342 01/06/22 0409  WBC 12.0* 11.3* 10.2 8.8 9.1  HGB 9.6* 9.5* 9.3*  9.4* 8.9*  HCT 29.5* 30.2* 28.8* 29.3* 28.2*  MCV 79.7* 80.7 81.4 80.9 82.0  PLT 200 242 271 289 303    Cardiac Enzymes: No results for input(s): "CKTOTAL", "CKMB", "CKMBINDEX", "TROPONINI" in the last 168 hours.  BNP: BNP (last 3 results) Recent Labs    07/24/21 1433 10/11/21 1230 12/27/21 1451  BNP 3,184.1* 400.8* >4,500.0*    ProBNP (last 3 results) No results for input(s): "PROBNP" in the last 8760 hours.    Other results:  Imaging: No results found.   Medications:     Scheduled Medications:  amiodarone  200 mg Oral Daily   Chlorhexidine Gluconate Cloth  6 each Topical Daily   digoxin  0.125 mg Oral Daily   feeding supplement  237 mL Oral TID BM   feeding supplement (PROSource TF)  45 mL Per Tube TID   midodrine  5 mg Per Tube TID WC   mouth rinse  15 mL Mouth Rinse 4 times per day   Warfarin - Pharmacist Dosing Inpatient   Does not apply q1600    Infusions:  sodium chloride 10 mL/hr at 01/04/22 2100   sodium chloride 10 mL/hr at 01/06/22 0700   sodium chloride Stopped  (12/30/21 2208)   ceFEPime (MAXIPIME) IV Stopped (01/06/22 0544)   heparin 1,750 Units/hr (01/06/22 0700)   vancomycin Stopped (01/06/22 0359)    PRN Medications: sodium chloride, acetaminophen (TYLENOL) oral liquid 160 mg/5 mL, ipratropium-albuterol, loperamide HCl, mouth rinse, oxyCODONE   Assessment/Plan:   1. Mixed cardiogenic/septic shock - echo 12/28/21 LVEF 10% RV sev HK - TEE 7/15 severe biventricular failure. + vegetation on mechanical MVR - Bcx - NGTD - Respiratory culture w/ moderate WBCs (mononuclear and PMN) and no organisms  - Cultures no growth.  - ID following. Continue vanc/cefepime. Now AF - Off inotropes/pressors. MAPs stable on midodrine 5 TID.  2. 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 Repeat echo as above.  - PTA Spironolactone, Farxiga, Digoxin and Losartan currently held in the setting of shock and his AKI.  - Echo this admit LVEF 10% RV severely HK.  - Long-term only option is transplant but not sure he will be candidate - CO-OX stable off Milrinone and NE. - CVP 9. Start 40 mg furosemide daily - Off GDMT with pressors.  - Continue digoxin - Restart dapagliflozin 10 mg daily (on at home) - Add spiro tomorrow if able to discontinue midodrine   3. 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  - Underwent MVR with mechanical mitral valve in 05/20[facility] at [facility].  - TEE 7/15 severe biventricular failure. + vegetation on mechanical MVR - Bcx NGTD. -> on vanc/cefepime. 6 weeks IV abx. Will need tunneled PICC. - ID following. Appreciate assistance. - Continue heparin. INR 1.3.  Continue warfarin   4. Acute hypoxic respiratory failure - intubated 7/15 in  setting of shock - Extubated 719  - Improved. CCM managing  5. Paroxysmal Atrial Fibrillation - In SR  - does not tolerate AF - liver enzymes better. - back on amio 200 daily  5. AKI - Due to ATN/shock - Baseline creatinine 0.98 - 1.0.  - Peak 2.6. Resolved.   6. Shock liver - continue supportive care - AST /ALT  continue trend down.   7. Autoimmune Disorder  - He has seronegative spondylitis and pyoderma gangrenosum. Had been on Humira. - Followed by Rheumatology as an outpatient.   8. Hypomagnesemia/hypokalemia - Mag 1.7 today. Supp. Give 4 gm mag IV. - K 4.3.  9. Deconditioning PT/OT following. CIR   Okay for transfer to progressive care.   Length of Stay: 9  FINCH, LINDSAY N, PA-C  7:33 AM   01/06/2022, 7:33 AM  Advanced Heart Failure Team Pager [phone] (M-F; 7a - 4p)  Please contact [facility] for night-coverage after hours (4p -7a ) and weekends on amion.   Patient seen and examined with the above-signed Advanced Practice Provider and/or Housestaff. I personally reviewed laboratory data, imaging studies and relevant notes. I independently examined the patient and formulated the important aspects of the plan. I have edited the note to reflect any of my changes or salient points. I have personally discussed the plan with the patient and/or family.  Co-ox stable off milrinone. CVP 9-10. Afebrile. On IV abx. On heparin/warfarin INR 1.3  General:  Sitting in chair No resp difficulty HEENT: normal Neck: supple. no JVD. Carotids 2+ bilat; no bruits. No lymphadenopathy or thryomegaly appreciated. Cor: PMI nondisplaced. Regular rate & rhythm. No rubs, gallops or murmurs. Lungs: clear Abdomen: soft, nontender, nondistended. No hepatosplenomegaly. No bruits or masses. Good bowel sounds. Extremities: no cyanosis, clubbing, rash, edema  + pyoderma lesions Neuro: alert & orientedx3, cranial nerves grossly intact. moves all 4 extremities w/o difficulty. Affect  pleasant  Much improved. Now off inotropes. Continue IV abx. Continue heparin/warfarin. Can move out of ICU. I d/w [facility] transplant team to see if he would be transplant candidate down the road in setting of pyoderma as immunosuppressant regimen may suppress disease to reduce risk of re-infection. They will discuss.   Arvilla Meres, MD  8:56 AM

## 2022-01-06 NOTE — Telephone Encounter (Signed)
Called and was unable to leave patient a voice message to confirm/remind patient of their appointment at the [facility] on 01/07/22.

## 2022-01-06 NOTE — Progress Notes (Addendum)
     Daily Progress Note Intern Pager: [phone]  Patient name: ROBYN NOHR Medical record number: [id number] Date of birth: [birth year] Age: 36 y.o. Gender: male  Primary Care Provider: No primary care provider on file. Consultants: PCCM, ID, cardiology, pharmacy Code Status: Full  Pt Overview and Major Events to Date:  -7/14 admitted FPTS for sepsis, N/V. Started on flagyl, cefepime, vanc -7/15 worsening shock> ICU> intubated, pressors, abx. Flagyl stopped -7/16 decreasing milrinone for high CO and co-ox -7/19 extubated -7/21 drop in cardiac index, Levophed restarted, Swan discontinued -7/22 levophed stopped -7/23 milrinone stopped -7/24 out to FMTS  Assessment and Plan:  STYLES FAMBRO is a 36 y.o. male who presented with nausea, vomiting, and diarrhea and found to have cardiogenic and septic shock with MV endocarditis. Pertinent PMH/PSH includes MR s/p MV mechanical valve on warfarin, biventricular HF, and PAF.   Paroxysmal atrial fibrillation (HCC) Stable. On amiodarone 200 mg daily. -Cardiology following, appreciate recs -F/u LFTs with AM CMP -Monitor rate/rhythm control  AKI (acute kidney injury) (HCC) Resolved with improving shock. -Continue fluids, PO as tolerated -AM BMP  Endocarditis of prosthetic mitral valve (HCC) Stable on abx. Mechanical valve present, on wafarin at home. Now on heparin. Bcx on admission with no growth, though only 2 bottles obtained. -Continue vanc 750 mg Q8, cefepime 2 g Q8 until August 27th, 2023 -On heparin bridge with goal to transition to warfarin, INR 3 -Cardiology following, appreciate recs  Shock, septic and cardiogenic Stable and improving out of ICU. On midodrine, vanc, cefepime. -Monitor VS -Continue midodrine -Continue abx per endocarditis  Chronic heart failure (HCC) Stable. Echo with biventricular failure, EF this admission <20%. Last dose lasix 7/22. Weight downtrending over admission. On digoxin 0.125 mg  daily. -F/u cardiology recs, unsure of transplant candidacy -Continue digoxin -Monitor fluid overload  FEN/GI: Thin diet, KVO NS fluids PPx: Heparin infusion Dispo: Potentially CIR pending clinical improvement and PT/OT recommendations  Subjective:  Doing well this morning without complaints.  Objective: Temp:  [97.8 F (36.6 C)-98.5 F (36.9 C)] 97.8 F (36.6 C) (07/24 1050) Pulse Rate:  [69-82] 70 (07/24 0900) Resp:  [16-36] 19 (07/24 0900) BP: (91-116)/(61-86) 96/68 (07/24 0900) SpO2:  [95 %-100 %] 99 % (07/24 0900) Weight:  [88.5 kg] 88.5 kg (07/24 0500) Physical Exam: General: Lying in bed, in NAD Cardiovascular: RRR, no m/r/g appreciated Respiratory: CTAB anteriorly Abdomen: Soft, normoactive bowel sounds, nontender  Laboratory: Most recent CBC Lab Results  Component Value Date   WBC 9.1 01/06/2022   HGB 8.9 (L) 01/06/2022   HCT 28.2 (L) 01/06/2022   MCV 82.0 01/06/2022   PLT 303 01/06/2022   Most recent BMP    Latest Ref Rng & Units 01/06/2022    4:09 AM  BMP  Glucose 70 - 99 mg/dL 86   BUN 6 - 20 mg/dL 17   Creatinine 2.69 - 1.24 mg/dL 4.85   Sodium 462 - 703 mmol/L 134   Potassium 3.5 - 5.1 mmol/L 4.3   Chloride 98 - 111 mmol/L 104   CO2 22 - 32 mmol/L 25   Calcium 8.9 - 10.3 mg/dL 8.6    Carboxyhemoglobin 2.4  [name], MD 01/06/2022, 1:17 PM PGY-1, [facility] FPTS Intern pager: [phone], text pages welcome Secure chat group [facility] Teaching Service

## 2022-01-06 NOTE — TOC CM/SW Note (Signed)
HF TOC CM spoke to pt's mother is wanting SNF rehab. She has picked [facility] or facility in SNF. Gave permission to fax referral to SNF rehab and create FL2. Pt will need IV abx at facility for 6 weeks. Her next preference is IP rehab at [facility] or [facility]. Isidoro Donning RN3 CCM, Heart Failure TOC CM [phone]

## 2022-01-06 NOTE — TOC CM/SW Note (Signed)
HF TOC CM spoke to [facility] RN and will use [facility] for [facility]   HF TOC CM contacted [facility] IV Infusion Coordinator, Pam with new referral for IV abx at home. Pt was not recommended for IP rehab or SNF. Contacted his mother with update. Will need HH RN orders with F2F and OPAT orders for IV abx. Isidoro Donning RN3 CCM, Heart Failure TOC CM [phone]

## 2022-01-06 NOTE — Assessment & Plan Note (Addendum)
Stable at this time. - STARTED carvedilol (Coreg) 3.125 mg - continuefurosemide 40 mg - continue sacubitril-valsartan [name]) - continue spironolactone 25 mg - Continue digoxin 0.125 mg and Farxiga 10 mg - Appreciate heart failure team care and recommendations

## 2022-01-06 NOTE — Progress Notes (Signed)
[facility]   Reason for visit: Follow up on prosthetic mitral valve endocarditis  Interval History: no acute events; WBC 9.1, remains afebrile > 72 hours.    Day 11 total antibiotics  Physical Exam: Constitutional:  Vitals:   01/06/22 0803 01/06/22 0900  BP:  96/68  Pulse:  70  Resp:  19  Temp: 97.8 F (36.6 C)   SpO2:  99%   patient appears in NAD Respiratory: Normal respiratory effort; CTA B  Review of Systems: Constitutional: negative for fevers and chills  Lab Results  Component Value Date   WBC 9.1 01/06/2022   HGB 8.9 (L) 01/06/2022   HCT 28.2 (L) 01/06/2022   MCV 82.0 01/06/2022   PLT 303 01/06/2022    Lab Results  Component Value Date   CREATININE 0.75 01/06/2022   BUN 17 01/06/2022   NA 134 (L) 01/06/2022   K 4.3 01/06/2022   CL 104 01/06/2022   CO2 25 01/06/2022    Lab Results  Component Value Date   ALT 498 (H) 01/06/2022   AST 407 (H) 01/06/2022   ALKPHOS 115 01/06/2022     Microbiology: Recent Results (from the past 240 hour(s))  Culture, blood (routine x 2)     Status: None   Collection Time: 12/27/21  2:52 PM   Specimen: BLOOD  Result Value Ref Range Status   Specimen Description BLOOD RIGHT ANTECUBITAL  Final   Special Requests   Final    BOTTLES DRAWN AEROBIC AND ANAEROBIC Blood Culture adequate volume   Culture   Final    NO GROWTH 5 DAYS Performed at [facility], [address], [place], [place] [address]    Report Status 01/02/2022 FINAL  Final  Resp Panel by RT-PCR (Flu A&B, Covid) Anterior Nasal Swab     Status: None   Collection Time: 12/27/21  6:04 PM   Specimen: Anterior Nasal Swab  Result Value Ref Range Status   SARS Coronavirus 2 by RT PCR NEGATIVE NEGATIVE Final    Comment: (NOTE) SARS-CoV-2 target nucleic acids are NOT DETECTED.  The SARS-CoV-2 RNA is generally detectable in upper respiratory specimens during the acute phase of infection. The lowest concentration of SARS-CoV-2  viral copies this assay can detect is 138 copies/mL. A negative result does not preclude SARS-Cov-2 infection and should not be used as the sole basis for treatment or other patient management decisions. A negative result may occur with  improper specimen collection/handling, submission of specimen other than nasopharyngeal swab, presence of viral mutation(s) within the areas targeted by this assay, and inadequate number of viral copies(<138 copies/mL). A negative result must be combined with clinical observations, patient history, and epidemiological information. The expected result is Negative.  Fact Sheet for Patients:  EntrepreneurPulse.com.au  Fact Sheet for Healthcare Providers:  IncredibleEmployment.be  This test is no t yet approved or cleared by the [place] FDA and  has been authorized for detection and/or diagnosis of SARS-CoV-2 by FDA under an Emergency Use Authorization (EUA). This EUA will remain  in effect (meaning this test can be used) for the duration of the COVID-19 declaration under Section 564(b)(1) of the Act, 21 U.S.C.section 360bbb-3(b)(1), unless the authorization is terminated  or revoked sooner.       Influenza A by PCR NEGATIVE NEGATIVE Final   Influenza B by PCR NEGATIVE NEGATIVE Final    Comment: (NOTE) The Xpert Xpress SARS-CoV-2/FLU/RSV plus assay is intended as an aid in the diagnosis of  influenza from Nasopharyngeal swab specimens and should not be used as a sole basis for treatment. Nasal washings and aspirates are unacceptable for Xpert Xpress SARS-CoV-2/FLU/RSV testing.  Fact Sheet for Patients: EntrepreneurPulse.com.au  Fact Sheet for Healthcare Providers: IncredibleEmployment.be  This test is not yet approved or cleared by the [place] FDA and has been authorized for detection and/or diagnosis of SARS-CoV-2 by FDA under an Emergency Use Authorization  (EUA). This EUA will remain in effect (meaning this test can be used) for the duration of the COVID-19 declaration under Section 564(b)(1) of the Act, 21 U.S.C. section 360bbb-3(b)(1), unless the authorization is terminated or revoked.  Performed at [facility], [place] [address] [place], [place] [address]   Culture, blood (Routine X 2) w Reflex to ID Panel     Status: None   Collection Time: 12/28/21 10:45 AM   Specimen: BLOOD  Result Value Ref Range Status   Specimen Description BLOOD CENTRAL LINE  Final   Special Requests   Final    BOTTLES DRAWN AEROBIC AND ANAEROBIC Blood Culture adequate volume   Culture   Final    NO GROWTH 5 DAYS Performed at [facility], [place] [address], [place], [place] [address]    Report Status 01/02/2022 FINAL  Final  Culture, Respiratory w Gram Stain     Status: None   Collection Time: 12/29/21  9:00 AM   Specimen: Tracheal Aspirate; Respiratory  Result Value Ref Range Status   Specimen Description TRACHEAL ASPIRATE  Final   Special Requests NONE  Final   Gram Stain   Final    MODERATE WBC PRESENT,BOTH PMN AND MONONUCLEAR NO ORGANISMS SEEN    Culture   Final    NO GROWTH 2 DAYS Performed at [facility], [address] [place], [place] [address]    Report Status 12/31/2021 FINAL  Final  Culture, blood (Routine X 2) w Reflex to ID Panel     Status: None   Collection Time: 12/29/21  9:58 AM   Specimen: BLOOD LEFT HAND  Result Value Ref Range Status   Specimen Description BLOOD LEFT HAND  Final   Special Requests   Final    BOTTLES DRAWN AEROBIC AND ANAEROBIC Blood Culture adequate volume   Culture   Final    NO GROWTH 5 DAYS Performed at [facility], [place] [address] [place], [place] [address]    Report Status 01/03/2022 FINAL  Final  Culture, blood (Routine X 2) w Reflex to ID Panel     Status: None   Collection Time: 12/29/21  9:58 AM   Specimen: BLOOD LEFT ARM  Result Value Ref Range Status   Specimen  Description BLOOD LEFT ARM  Final   Special Requests   Final    BOTTLES DRAWN AEROBIC AND ANAEROBIC Blood Culture results may not be optimal due to an inadequate volume of blood received in culture bottles   Culture   Final    NO GROWTH 5 DAYS Performed at [facility], [place] [address], [place], [place] [address]    Report Status 01/03/2022 FINAL  Final    Impression/Plan:  1. Prosthetic mitral valve endocarditis - cultures have remained negative so plan to continue with IV vancomycin and cefepime for 6 weeks.   No changes to plan.    2.  Access - I will defer appropriate access to the heart failure team. Though his creat is good, I think a tunneled catheter will be  preferred.    3.  OPAT - will place opat for home antibiotics  Diagnosis: Prothetic mitral valve endocarditis  Culture Result: negative cultures  No Known Allergies  OPAT Orders Discharge antibiotics to be given via PICC line Discharge antibiotics: vancomycin IV + cefepime IV every 8 hours Per pharmacy protocol yes Aim for Vancomycin trough 15-20 or AUC 400-550 (unless otherwise indicated) Duration: 6 weeks End Date: 02/06/22  [facility] Per Protocol: yes  Home health RN for IV administration and teaching; PICC line care and labs.    Labs weekly while on IV antibiotics: _x_ CBC with differential __ BMP __x CMP __ CRP __ ESR __x Vancomycin trough __ CK  __x Please pull PIC at completion of IV antibiotics - tunneled catheter __ Please leave PIC in place until doctor has seen patient or been notified  Fax weekly labs to [phone]  Clinic Follow Up Appt: 02/04/22 with Dr. Linus Salmons  @ 10:30 am

## 2022-01-06 NOTE — Assessment & Plan Note (Addendum)
Stable and improving out of ICU. On vanc, cefepime. Off midodrine given good BP. -Monitor VS -Continue abx per endocarditis

## 2022-01-06 NOTE — Progress Notes (Signed)
Inpatient Rehab Admissions Coordinator:   CIR consult received. Pt. Is currently transferring and ambulating at supervision level, PT recommends no follow up following d/c. Pt. Does not appear to require the intensity of CIR, so I will not pursue CIR admission for this Pt.  Megan Salon, MS, CCC-SLP Rehab Admissions Coordinator  [phone] (celll) [phone] (office)

## 2022-01-06 NOTE — Assessment & Plan Note (Addendum)
Stable, rate controlled. -Continue amiodarone 200 mg daily -Cardiology following, appreciate conditions.

## 2022-01-06 NOTE — Progress Notes (Signed)
Physical Therapy Treatment Patient Details Name: Donald [name] MRN: [id number] DOB: [birth year] Today's Date: 01/06/2022   History of Present Illness Pt is a 36 y.o. 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 Transfer to ICU 7/15 with cardiogenic and septic shock; ETT 7/15-7/19. 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   PT Comments    Pt progressing well with mobility. Pt ambulating without assist at supervision-level; performing higher level balance tasks without overt instability or LOB. Pt's cognition improving as well, still with apparent impaired attention and awareness, unsure baseline. Pt no longer requires AIR/SNF-level therapies at d/c, pt in agreement; CM and SW aware. Will continue to follow acutely.    Recommendations for follow up therapy are one component of a multi-disciplinary discharge planning process, led by the attending physician.  Recommendations may be updated based on patient status, additional functional criteria and insurance authorization.  Follow Up Recommendations  No PT follow up     Assistance Recommended at Discharge Intermittent Supervision/Assistance  Patient can return home with the following Assistance with cooking/housework;Direct supervision/assist for medications management;Direct supervision/assist for financial management;Assist for transportation   Equipment Recommendations  None recommended by PT    Recommendations for Other Services       Precautions / Restrictions Precautions Precautions: Fall Restrictions Weight Bearing Restrictions: No     Mobility  Bed Mobility Overal bed mobility: Modified Independent                  Transfers Overall transfer level: Needs assistance Equipment used: None Transfers: Sit to/from Stand Sit to Stand: Supervision                Ambulation/Gait Ambulation/Gait  assistance: Supervision Gait Distance (Feet): 600 Feet Assistive device: None Gait Pattern/deviations: Step-through pattern, Decreased stride length Gait velocity: Decreased     General Gait Details: slow, steady gait without DME, supervision for safety first time ambulating; no overt instability or LOB   Stairs             Wheelchair Mobility    Modified Rankin (Stroke Patients Only)       Balance Overall balance assessment: Needs assistance Sitting-balance support: No upper extremity supported Sitting balance-Leahy Scale: Good     Standing balance support: No upper extremity supported, During functional activity Standing balance-Leahy Scale: Good                   Standardized Balance Assessment Standardized Balance Assessment : Dynamic Gait Index   Dynamic Gait Index Level Surface: Normal Change in Gait Speed: Mild Impairment Gait with Horizontal Head Turns: Normal Gait with Vertical Head Turns: Normal Gait and Pivot Turn: Normal Step Over Obstacle: Mild Impairment Step Around Obstacles: Normal Steps: Normal (inferred) Total Score: 22      Cognition Arousal/Alertness: Awake/alert Behavior During Therapy: WFL for tasks assessed/performed, Flat affect Overall Cognitive Status: No family/caregiver present to determine baseline cognitive functioning Area of Impairment: Attention, Awareness, Problem solving                   Current Attention Level: Selective   Following Commands: Follows one step commands consistently   Awareness: Emergent Problem Solving: Slow processing, Requires verbal cues General Comments: much improved cognition; still with difficulty multi-tasking, suspect some sort of baseline cognitive deficit related to attention? no family present to verify        Exercises      General Comments General  comments (skin integrity, edema, etc.): BP 103/64, HR 73, SpO2 98% on RA. discussed d/c recommendations with new plan for  home, pt in agreement; SW/CM notified      Pertinent Vitals/Pain Pain Assessment Pain Assessment: No/denies pain Pain Intervention(s): Monitored during session    Home Living                          Prior Function            PT Goals (current goals can now be found in the care plan section) Progress towards PT goals: Progressing toward goals    Frequency    Min 2X/week      PT Plan Discharge plan needs to be updated;Frequency needs to be updated    Co-evaluation              AM-PAC PT "6 Clicks" Mobility   Outcome Measure  Help needed turning from your back to your side while in a flat bed without using bedrails?: None Help needed moving from lying on your back to sitting on the side of a flat bed without using bedrails?: None Help needed moving to and from a bed to a chair (including a wheelchair)?: A Little Help needed standing up from a chair using your arms (e.g., wheelchair or bedside chair)?: A Little Help needed to walk in hospital room?: A Little Help needed climbing 3-5 steps with a railing? : A Little 6 Click Score: 20    End of Session Equipment Utilized During Treatment: Gait belt Activity Tolerance: Patient tolerated treatment well Patient left: in chair;with call bell/phone within reach;with chair alarm set Nurse Communication: Mobility status PT Visit Diagnosis: Other abnormalities of gait and mobility (R26.89);Difficulty in walking, not elsewhere classified (R26.2);Muscle weakness (generalized) (M62.81)     Time: 9629-5284 PT Time Calculation (min) (ACUTE ONLY): 15 min  Charges:  $Gait Training: 8-22 mins                     Ina Homes, PT, DPT Acute Rehabilitation Services  Personal: Secure Chat Rehab Office: [phone]  Malachy Chamber 01/06/2022, 10:46 AM

## 2022-01-06 NOTE — Progress Notes (Signed)
Speech Language Pathology Treatment: Dysphagia  Patient Details Name: Donald [name] MRN: [id number] DOB: 04-19-1986 Today's Date: 01/06/2022 Time: 9566-7177 SLP Time Calculation (min) (ACUTE ONLY): 10 min  Assessment / Plan / Recommendation Clinical Impression  Mr. Sass was alert and participatory.  Diet was downgraded to dysphagia 3 since last seen by SLP, but RN reports he is requesting regular diet now that he is more alert.  BS are clear.  He consumed regular solids and thin liquids and was observed to tolerate POs well with no s/s of dysphagia. Self feeds independently.  He reports no swallowing issues.  No further s/s of dysphagia. Advance diet to regular solids (HH) and thin liquids; SLP will sign off. No f/u needed.   HPI HPI: 36 year old male with who presented 7/14 with c/o of N/V/D, subjective fever for 2-3 days with poor PO intake and SOB but denied CP, palpitations, or abdominal pain. Transferred to ICU 7/15 cardiogenic and septic shock , found to have prosthetic mitral valve endocarditis on TEE, EF less than 20%. Pt prior hx of MR s/p MVR w/ mechanical valve on coumadin, Biventricular HF, and PAF.      SLP Plan  All goals met      Recommendations for follow up therapy are one component of a multi-disciplinary discharge planning process, led by the attending physician.  Recommendations may be updated based on patient status, additional functional criteria and insurance authorization.    Recommendations  Diet recommendations: Regular;Thin liquid Liquids provided via: Cup;Straw Medication Administration: Whole meds with liquid Supervision: Patient able to self feed                Oral Care Recommendations: Oral care BID Follow Up Recommendations: No SLP follow up Assistance recommended at discharge: None SLP Visit Diagnosis: Dysphagia, unspecified (R13.10) Plan: All goals met         Meghanne Pletz L. Tivis Ringer, MA CCC/SLP Clinical Specialist - Acute Care  SLP Acute Rehabilitation Services Office number [phone]   Juan Quam Laurice  01/06/2022, 9:19 AM

## 2022-01-06 NOTE — Progress Notes (Signed)
Nutrition Follow-up  DOCUMENTATION CODES:   Not applicable  INTERVENTION:   Ensure Enlive po TID, each supplement provides 350 kcal and 20 grams of protein.  D/C Pro-Source   NUTRITION DIAGNOSIS:   Inadequate oral intake related to acute illness as evidenced by NPO status.  Being addressed as diet advanced, supplements  GOAL:   Patient will meet greater than or equal to 90% of their needs  Progressing   MONITOR:   TF tolerance, Vent status, Labs, Weight trends  REASON FOR ASSESSMENT:   Ventilator, Consult Enteral/tube feeding initiation and management  ASSESSMENT:   36 yo male admitted with mixed shock-septic and cardiogenic, acute on chronic biventricular HF, acute respiratory failure requiring intubation, AKI, shock liver PMH includes CHF, mitral valve replacement, seronegative spondylitis and pyoderma gangrenosum  7/14 Admitted 7/15 ECHO LVEF 10%, severe biventricular failure, +vegetation on mechancial MVR, Intubated 7/16 Trickle TF initiated 7/17 TF titrated towards goal 7/19 Extubated, Cortrak placed, TF continued 7/21 Diet advanced, Changed to Nocturnal, TF via Cortrak 7/23 Cortrak removed, TF d/c  Pt looks much stronger today than on last assessment. SLP evaluated and pt also requesting to no longer be on Dysphagia 3 diet; diet advanced to Hear Healthy  Pt sitting up in chair and just finished lunch on visit. Pt ate entire hamburger for lunch. Recorded po intake 50% at breakfast this AM. No other po intake. Pt likes Ensure shakes and is drinking these as well  Cortrak discontinued yesterday, TF discontinued, Pro-Source TF remains ordered but will d/c today  +BM, rectal tube removed  Labs: CBGs 74-110, reviewed Meds: lasix, midodrine  Diet Order:   Diet Order             Diet Heart Room service appropriate? 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  Estimated Nutritional Needs:   Kcal:  2200-2400 kcals  Protein:  115-140 g  Fluid:  <1.8 L   01/08/22 MS, RDN, LDN, CNSC Registered Dietitian 3 Clinical Nutrition RD Pager and On-Call Pager Number Located in [place]

## 2022-01-06 NOTE — Progress Notes (Signed)
ANTICOAGULATION CONSULT NOTE  Pharmacy Consult for heparin>>warfarin Indication:  MVR /AFib  No Known Allergies  Patient Measurements: Height: 5\' 10"  (177.8 cm) Weight: 88.5 kg (195 lb 1.7 oz) IBW/kg (Calculated) : 73 Heparin Dosing Weight: 90.7 kg  Vital Signs: Temp: 97.8 F (36.6 C) (07/24 0803) Temp Source: Oral (07/24 0803) BP: 99/66 (07/24 0700) Pulse Rate: 72 (07/24 0700)  Labs: Recent Labs    01/04/22 0323 01/05/22 0342 01/05/22 1235 01/05/22 2110 01/06/22 0409  HGB 9.3* 9.4*  --   --  8.9*  HCT 28.8* 29.3*  --   --  28.2*  PLT 271 289  --   --  303  LABPROT 15.6* 15.1  --   --  15.6*  INR 1.3* 1.2  --   --  1.3*  HEPARINUNFRC 0.66 0.99* 0.91* 0.56 0.54  CREATININE 0.69 0.74  --   --  0.75     Estimated Creatinine Clearance: 147.1 mL/min (by C-G formula based on SCr of 0.75 mg/dL).   Medical History: Past Medical History:  Diagnosis Date   Anemia    Autoimmune disorder (HCC)    pyoderma gangrenosum   CHF (congestive heart failure) (HCC)    Chronic systolic heart failure (HCC)    a. EF 35-40% by echo in 07/2018 b. EF at 45% by repeat echo in 04/2020   DVT (deep venous thrombosis) (HCC)    h/o   Dysrhythmia    Mitral regurgitation    a. 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 b. 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HCC)    arthritis   Tricuspid regurgitation    Assessment: 33 yom with a history of mitral regurgitation s/p valve replacement on Coumadin, HF, AF and prior DVT, and Hx of hidradenitis and pyoderma gangrenosum .   Patient taking warfarin pta. Home dose is 4mg  daily per last [facility] note. INR 1.4 on 7/10 instructed to take 6mg  that night and then continue 4mg  daily. Last taken 7/14.  Heparin level at goal this am  0.54 on heparin drip rate 1750 units/hr - running through peripheral IV and labs drawn from CL. Warfarin resumed 7/21, INR down significantly with resolving shock.  INR 1.3 today  LFTs also down 4000> 400 resume amiodarone as well.  No bleeding HGB stable 9s pltc 300s   Goal of Therapy:  INR goal 2.5-3.5 Heparin level: 0.3-0.7 units/mL (targeting low end of goal range) Monitor platelets by anticoagulation protocol: Yes   Plan:  Continue heparin to 1750 units/hr Warfarin 7.5 mg tonight - repeat  Daily INR, heparin level, and cbc   Pharm.D. CPP, BCPS Clinical Pharmacist [phone] 01/06/2022 8:17 AM   Check [email or web address] for unit specific pharmacy number

## 2022-01-06 NOTE — Assessment & Plan Note (Addendum)
Stable, afebrile. INR 2.4 (8/4) - Continue vancomycin and cefepime through PICC.  Patient is set up to go home with home antibiotic infusions. End date 8/27. -on warfarin

## 2022-01-06 NOTE — Progress Notes (Signed)
PHARMACY CONSULT NOTE FOR:  OUTPATIENT  PARENTERAL ANTIBIOTIC THERAPY (OPAT)  Indication: Culture negative prosthetic mitral valve endocarditis  Regimen: Vancomycin 750 mg IV Q 8 hours + Cefepime 2 gm IV Q 8 hours  End date: 02/06/22  Checking Vanc levels tomorrow to assure appropriate dose for discharge- will update as needed.    IV antibiotic discharge orders are pended. To discharging provider:  please sign these orders via discharge navigator,  Select New Orders & click on the button choice - Manage This Unsigned Work.     Thank you for allowing pharmacy to be a part of this patient's care.  Sharin Mons, PharmD, BCPS, BCIDP Infectious Diseases Clinical Pharmacist Phone: [phone] 01/06/2022, 10:59 AM

## 2022-01-06 NOTE — Progress Notes (Signed)
Occupational Therapy Treatment Patient Details Name: Donald [name] MRN: [id number] DOB: February 07, 1986 Today's Date: 01/06/2022   History of present illness Pt is a 36 y.o. 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 Transfer to ICU 7/15 with cardiogenic and septic shock; ETT 7/15-7/19. 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   OT comments  Pt progressing very well this session, more interactive. Pt has cognitive deficits that remain (baseline?!?!) no family present to determine. Pt overall very cooperative and pleasant. Lb dressing min A for socks. Ambulated initially min guard progressing to supervision in room to sink for sink level grooming. Able to manage fine motor, sequencing at sink. POC updated to reflect No OT Follow up- but will continue to maximize OT acutely to ensure return to PLOF. Next session to focus on cognitive aspects.    Recommendations for follow up therapy are one component of a multi-disciplinary discharge planning process, led by the attending physician.  Recommendations may be updated based on patient status, additional functional criteria and insurance authorization.    Follow Up Recommendations  No OT follow up    Assistance Recommended at Discharge Intermittent Supervision/Assistance  Patient can return home with the following  A little help with walking and/or transfers;Assistance with cooking/housework;Help with stairs or ramp for entrance;Assist for transportation;Direct supervision/assist for financial management;Direct supervision/assist for medications management;A little help with bathing/dressing/bathroom   Equipment Recommendations  None recommended by OT    Recommendations for Other Services      Precautions / Restrictions Precautions Precautions: Fall Restrictions Weight Bearing Restrictions: No       Mobility Bed  Mobility Overal bed mobility: Modified Independent Bed Mobility: Supine to Sit     Supine to sit: Min guard     General bed mobility comments: HOB 30 degrees, physical assist to bend knee and roll to left with assist to lift trunk to surface. Pt participating with tactile facilitation    Transfers Overall transfer level: Needs assistance Equipment used: None Transfers: Sit to/from Stand Sit to Stand: Supervision           General transfer comment: rocking momentum used initially but no physical assist     Balance Overall balance assessment: Needs assistance Sitting-balance support: No upper extremity supported Sitting balance-Leahy Scale: Good Sitting balance - Comments: supervision for sitting   Standing balance support: No upper extremity supported, During functional activity Standing balance-Leahy Scale: Good Standing balance comment: single UE support in standing                           ADL either performed or assessed with clinical judgement   ADL Overall ADL's : Needs assistance/impaired     Grooming: Wash/dry hands;Wash/dry face;Supervision/safety;Oral care;Standing Grooming Details (indicate cue type and reason): sink level             Lower Body Dressing: Sit to/from stand;Minimal assistance Lower Body Dressing Details (indicate cue type and reason): donning socks Toilet Transfer: Min guard;Ambulation Toilet Transfer Details (indicate cue type and reason): initially, progressed to supervision Toileting- Clothing Manipulation and Hygiene: Supervision/safety;Sit to/from stand Toileting - Clothing Manipulation Details (indicate cue type and reason): using urinal     Functional mobility during ADLs: Supervision/safety General ADL Comments: Greatly improved from previous session. Some cognitive deficits remain (baseline?!?!) for example Pt itching/scriatching legs, provided with lotion, instead of applying - Pt placed on bed. OT applied to legs  and Pt rubbed in verbalizing relief - but he did not think to do this himself.    Extremity/Trunk Assessment Upper Extremity Assessment Upper Extremity Assessment: Generalized weakness (able to open toothpaste, grasp toothbrush, cups etc)   Lower Extremity Assessment Lower Extremity Assessment: Defer to PT evaluation        Vision   Vision Assessment?: No apparent visual deficits   Perception     Praxis      Cognition Arousal/Alertness: Awake/alert Behavior During Therapy: WFL for tasks assessed/performed, Flat affect Overall Cognitive Status: No family/caregiver present to determine baseline cognitive functioning Area of Impairment: Attention, Awareness, Problem solving                   Current Attention Level: Selective       Awareness: Emergent Problem Solving: Slow processing, Requires verbal cues General Comments: much improved cognition; still with difficulty multi-tasking, suspect some sort of baseline cognitive deficit related to attention? no family present to verify        Exercises      Shoulder Instructions       General Comments SpO2 98% on RA. discussed d/c recommendations with new plan for home, pt in agreement; SW/CM notified    Pertinent Vitals/ Pain       Pain Assessment Pain Assessment: No/denies pain Faces Pain Scale: No hurt Pain Intervention(s): Monitored during session, Repositioned  Home Living                                          Prior Functioning/Environment              Frequency  Min 2X/week        Progress Toward Goals  OT Goals(current goals can now be found in the care plan section)  Progress towards OT goals: Progressing toward goals  Acute Rehab OT Goals Patient Stated Goal: get home to son OT Goal Formulation: With patient Time For Goal Achievement: 01/17/22 Potential to Achieve Goals: Good ADL Goals Pt Will Perform Grooming: with supervision;standing Pt Will Perform Upper  Body Dressing: sitting;with set-up Pt Will Perform Lower Body Dressing: with min guard assist;sit to/from stand Pt Will Transfer to Toilet: with supervision;ambulating;regular height toilet Pt/caregiver will Perform Home Exercise Program: Increased strength;Both right and left upper extremity;With theraband;With Supervision  Plan Discharge plan needs to be updated;Frequency remains appropriate    Co-evaluation                 AM-PAC OT "6 Clicks" Daily Activity     Outcome Measure   Help from another person eating meals?: None Help from another person taking care of personal grooming?: A Little Help from another person toileting, which includes using toliet, bedpan, or urinal?: A Little Help from another person bathing (including washing, rinsing, drying)?: A Little Help from another person to put on and taking off regular upper body clothing?: A Little Help from another person to put on and taking off regular lower body clothing?: A Little 6 Click Score: 19    End of Session Equipment Utilized During Treatment: Gait belt  OT Visit Diagnosis: Unsteadiness on feet (R26.81);Muscle weakness (generalized) (M62.81);Other symptoms and signs involving cognitive function   Activity Tolerance Patient tolerated treatment well   Patient Left in chair;with call bell/phone within reach;with chair alarm set;with family/visitor present   Nurse Communication Mobility status  Time: 0814-4818 OT Time Calculation (min): 20 min  Charges: OT General Charges $OT Visit: 1 Visit OT Treatments $Self Care/Home Management : 8-22 mins  Nyoka Cowden OTR/L Acute Rehabilitation Services Office: [phone]  Evern Bio [facility] 01/06/2022, 1:08 PM

## 2022-01-06 NOTE — Assessment & Plan Note (Signed)
Resolved with improving shock. -Continue fluids, PO as tolerated -AM BMP

## 2022-01-07 ENCOUNTER — Encounter (HOSPITAL_COMMUNITY): Payer: Medicaid Other

## 2022-01-07 DIAGNOSIS — I5022 Chronic systolic (congestive) heart failure: Secondary | ICD-10-CM | POA: Diagnosis not present

## 2022-01-07 DIAGNOSIS — Z952 Presence of prosthetic heart valve: Secondary | ICD-10-CM | POA: Diagnosis not present

## 2022-01-07 DIAGNOSIS — R570 Cardiogenic shock: Secondary | ICD-10-CM | POA: Diagnosis not present

## 2022-01-07 DIAGNOSIS — N179 Acute kidney failure, unspecified: Secondary | ICD-10-CM | POA: Diagnosis not present

## 2022-01-07 DIAGNOSIS — K529 Noninfective gastroenteritis and colitis, unspecified: Secondary | ICD-10-CM | POA: Diagnosis not present

## 2022-01-07 DIAGNOSIS — L88 Pyoderma gangrenosum: Secondary | ICD-10-CM | POA: Diagnosis not present

## 2022-01-07 DIAGNOSIS — I480 Paroxysmal atrial fibrillation: Secondary | ICD-10-CM | POA: Diagnosis not present

## 2022-01-07 DIAGNOSIS — Z7901 Long term (current) use of anticoagulants: Secondary | ICD-10-CM | POA: Diagnosis not present

## 2022-01-07 DIAGNOSIS — T826XXA Infection and inflammatory reaction due to cardiac valve prosthesis, initial encounter: Secondary | ICD-10-CM | POA: Diagnosis not present

## 2022-01-07 DIAGNOSIS — I38 Endocarditis, valve unspecified: Secondary | ICD-10-CM | POA: Diagnosis not present

## 2022-01-07 DIAGNOSIS — I429 Cardiomyopathy, unspecified: Secondary | ICD-10-CM | POA: Diagnosis not present

## 2022-01-07 LAB — COOXEMETRY PANEL
Carboxyhemoglobin: 1.8 % — ABNORMAL HIGH (ref 0.5–1.5)
Methemoglobin: 2.3 % — ABNORMAL HIGH (ref 0.0–1.5)
O2 Saturation: 68.8 %
Total hemoglobin: 10.1 g/dL — ABNORMAL LOW (ref 12.0–16.0)

## 2022-01-07 LAB — VANCOMYCIN, TROUGH: Vancomycin Tr: 20 ug/mL (ref 15–20)

## 2022-01-07 LAB — VANCOMYCIN, PEAK: Vancomycin Pk: 37 ug/mL (ref 30–40)

## 2022-01-07 LAB — GLUCOSE, CAPILLARY
Glucose-Capillary: 94 mg/dL (ref 70–99)
Glucose-Capillary: 85 mg/dL (ref 70–99)
Glucose-Capillary: 103 mg/dL — ABNORMAL HIGH (ref 70–99)
Glucose-Capillary: 83 mg/dL (ref 70–99)
Glucose-Capillary: 130 mg/dL — ABNORMAL HIGH (ref 70–99)

## 2022-01-07 LAB — BASIC METABOLIC PANEL
Anion gap: 6 (ref 5–15)
BUN: 22 mg/dL — ABNORMAL HIGH (ref 6–20)
CO2: 24 mmol/L (ref 22–32)
Calcium: 8.7 mg/dL — ABNORMAL LOW (ref 8.9–10.3)
Chloride: 102 mmol/L (ref 98–111)
Creatinine, Ser: 0.84 mg/dL (ref 0.61–1.24)
GFR, Estimated: >60 mL/min (ref >60)
Glucose, Bld: 109 mg/dL — ABNORMAL HIGH (ref 70–99)
Potassium: 4.2 mmol/L (ref 3.5–5.1)
Sodium: 132 mmol/L — ABNORMAL LOW (ref 135–145)

## 2022-01-07 LAB — HEPATIC FUNCTION PANEL
ALT: 375 U/L — ABNORMAL HIGH (ref 0–44)
AST: 317 U/L — ABNORMAL HIGH (ref 15–41)
Albumin: 2.4 g/dL — ABNORMAL LOW (ref 3.5–5.0)
Alkaline Phosphatase: 128 U/L — ABNORMAL HIGH (ref 38–126)
Bilirubin, Direct: 0.3 mg/dL — ABNORMAL HIGH (ref 0.0–0.2)
Indirect Bilirubin: 0.7 mg/dL (ref 0.3–0.9)
Total Bilirubin: 1 mg/dL (ref 0.3–1.2)
Total Protein: 8.4 g/dL — ABNORMAL HIGH (ref 6.5–8.1)

## 2022-01-07 LAB — CBC
HCT: 30.4 % — ABNORMAL LOW (ref 39.0–52.0)
Hemoglobin: 9.7 g/dL — ABNORMAL LOW (ref 13.0–17.0)
MCH: 26.4 pg (ref 26.0–34.0)
MCHC: 31.9 g/dL (ref 30.0–36.0)
MCV: 82.6 fL (ref 80.0–100.0)
Platelets: 319 10*3/uL (ref 150–400)
RBC: 3.68 MIL/uL — ABNORMAL LOW (ref 4.22–5.81)
RDW: 18 % — ABNORMAL HIGH (ref 11.5–15.5)
WBC: 9.5 10*3/uL (ref 4.0–10.5)
nRBC: 0 % (ref 0.0–0.2)

## 2022-01-07 LAB — MAGNESIUM: Magnesium: 1.7 mg/dL (ref 1.7–2.4)

## 2022-01-07 LAB — PROTIME-INR
INR: 1.3 — ABNORMAL HIGH (ref 0.8–1.2)
Prothrombin Time: 16.4 seconds — ABNORMAL HIGH (ref 11.4–15.2)

## 2022-01-07 LAB — HEPARIN LEVEL (UNFRACTIONATED): Heparin Unfractionated: 0.48 IU/mL (ref 0.30–0.70)

## 2022-01-07 MED ORDER — VANCOMYCIN HCL 750 MG/150ML IV SOLN
750.0000 mg | Freq: Two times a day (BID) | INTRAVENOUS | Status: DC
  Administered 2022-01-07 – 2022-01-16 (×19): 750 mg via INTRAVENOUS
  Filled 2022-01-07 (×20): qty 150

## 2022-01-07 MED ORDER — MAGNESIUM SULFATE IN D5W 1-5 GM/100ML-% IV SOLN
1.0000 g | Freq: Once | INTRAVENOUS | Status: DC
  Filled 2022-01-07: qty 100

## 2022-01-07 MED ORDER — SODIUM CHLORIDE 0.9% FLUSH
10.0000 mL | Freq: Two times a day (BID) | INTRAVENOUS | Status: DC
  Administered 2022-01-08 – 2022-01-10 (×4): 10 mL
  Administered 2022-01-12: 20 mL
  Administered 2022-01-13: 10 mL
  Administered 2022-01-14: 20 mL
  Administered 2022-01-14 – 2022-01-16 (×5): 10 mL
  Administered 2022-01-17: 20 mL

## 2022-01-07 MED ORDER — WARFARIN SODIUM 5 MG PO TABS
7.5000 mg | ORAL_TABLET | Freq: Once | ORAL | Status: AC
  Administered 2022-01-07: 7.5 mg via ORAL
  Filled 2022-01-07: qty 1

## 2022-01-07 MED ORDER — SODIUM CHLORIDE 0.9% FLUSH
10.0000 mL | INTRAVENOUS | Status: DC | PRN
  Administered 2022-01-17 (×2): 20 mL

## 2022-01-07 MED ORDER — MAGNESIUM SULFATE 4 GM/100ML IV SOLN
4.0000 g | Freq: Once | INTRAVENOUS | Status: AC
  Administered 2022-01-07: 4 g via INTRAVENOUS
  Filled 2022-01-07: qty 100

## 2022-01-07 MED ORDER — MIDODRINE HCL 5 MG PO TABS
5.0000 mg | ORAL_TABLET | Freq: Three times a day (TID) | ORAL | Status: DC
Start: 2022-01-07 — End: 2022-01-09
  Administered 2022-01-07 – 2022-01-08 (×6): 5 mg via ORAL
  Filled 2022-01-07 (×8): qty 1

## 2022-01-07 NOTE — Progress Notes (Signed)
Peripherally Inserted Central Catheter Placement  The IV Nurse has discussed with the patient and/or persons authorized to consent for the patient, the purpose of this procedure and the potential benefits and risks involved with this procedure.  The benefits include less needle sticks, lab draws from the catheter, and the patient may be discharged home with the catheter. Risks include, but not limited to, infection, bleeding, blood clot (thrombus formation), and puncture of an artery; nerve damage and irregular heartbeat and possibility to perform a PICC exchange if needed/ordered by physician.  Alternatives to this procedure were also discussed.  Bard Power PICC patient education guide, fact sheet on infection prevention and patient information card has been provided to patient /or left at bedside.    PICC Placement Documentation  PICC Single Lumen 01/07/22 Right Brachial 36 cm 0 cm (Active)  Indication for Insertion or Continuance of Line Home intravenous therapies (PICC only) 01/07/22 1500  Exposed Catheter (cm) 0 cm 01/07/22 1500  Site Assessment Clean, Dry, Intact 01/07/22 1500  Line Status Flushed;Saline locked;Blood return noted 01/07/22 1500  Dressing Type Transparent;Securing device 01/07/22 1500  Dressing Status Antimicrobial disc in place 01/07/22 1500  Safety Lock Not Applicable 01/07/22 1500  Line Care Connections checked and tightened 01/07/22 1500  Dressing Intervention New dressing 01/07/22 1500  Dressing Change Due 01/14/22 01/07/22 1500       Franne Grip [place] 01/07/2022, 3:24 PM

## 2022-01-07 NOTE — Progress Notes (Addendum)
Advanced Heart Failure Rounding Note   Subjective:   7/19 Extubated. Diuresed with IV lasix. I/O not accurate. Norepi weaned off.  7/20 Swan out  7/23 Milrinone off  ID following. On vanc/cefepime for pMVR endocarditis. BCx NGTD. Remains afebrile. IV team placing PICC today.  No CO-OX this am, check.   CVP 5. 5.5L UOP yesterday with PO lasix + farxiga.  SBP 90s-100s on midodrine 5 TID  Feeling well. No complaints this am. Denies dyspnea, orthopnea or PND. Ambulated several times yesterday. Progressing well with PT/OT.   Objective:     Vital Signs:   Temp:  [97.7 F (36.5 C)-98.4 F (36.9 C)] 97.7 F (36.5 C) (07/24 2341) Pulse Rate:  [70-75] 72 (07/25 0700) Resp:  [14-28] 14 (07/25 0700) BP: (93-106)/(65-81) 102/81 (07/25 0700) SpO2:  [93 %-100 %] 99 % (07/25 0700) Weight:  [88.4 kg] 88.4 kg (07/25 0500) Last BM Date : 01/06/22  Weight change: Filed Weights   01/05/22 0600 01/06/22 0500 01/07/22 0500  Weight: 91 kg 88.5 kg 88.4 kg    Intake/Output:   Intake/Output Summary (Last 24 hours) at 01/07/2022 0702 Last data filed at 01/07/2022 0600 Gross per 24 hour  Intake 1305.12 ml  Output 5510 ml  Net -4204.88 ml   General:  Lying in bed. No distress. HEENT: normal Neck: supple. no JVD. Carotids 2+ bilat; no bruits.  Cor: PMI nondisplaced. Regular rate & rhythm. No rubs, gallops or murmurs. Lungs: clear Abdomen: soft, nontender, nondistended. No hepatosplenomegaly.  Extremities: no cyanosis, clubbing, rash, edema, + pyoderma skin lesions. Neuro: alert & orientedx3, cranial nerves grossly intact. moves all 4 extremities w/o difficulty. Affect pleasant   Telemetry: SR 70s (personally reviewed)   Labs: Basic Metabolic Panel: Recent Labs  Lab 01/01/22 0346 01/01/22 1200 01/01/22 2018 01/01/22 2227 01/02/22 0501 01/03/22 0345 01/04/22 0323 01/05/22 0342 01/06/22 0409 01/07/22 0357  NA 135   < > 138   < > 135 138 133* 137 134* 132*  K 3.5   < >  3.8   < > 3.9 4.0 3.6 4.6 4.3 4.2  CL 109  --  105  --  104 107 104 104 104 102  CO2 23  --  23  --  23 28 25 28 25 24   GLUCOSE 116*  --  119*  --  125* 130* 130* 106* 86 109*  BUN 12  --  10  --  13 13 14 17 17  22*  CREATININE 0.68  --  0.63  --  0.74 0.64 0.69 0.74 0.75 0.84  CALCIUM 7.2*  --  7.8*  --  7.7* 8.3* 8.1* 8.5* 8.6* 8.7*  MG  --   --  1.5*  --   --   --  1.4* 1.6* 1.7 1.7  PHOS 1.6*  --  1.7*  --  2.7  --   --  3.3  --   --    < > = values in this interval not displayed.    Liver Function Tests: Recent Labs  Lab 01/02/22 0501 01/03/22 0345 01/04/22 0323 01/06/22 0409 01/07/22 0357  AST 2,467* 1,352* 910* 407* 317*  ALT 1,680* 1,123* 826* 498* 375*  ALKPHOS 133* 122 143* 115 128*  BILITOT 1.1 1.3* 1.0 1.0 1.0  PROT 7.5 6.9 7.3 7.6 8.4*  ALBUMIN 2.0* 2.0* 2.0* 2.2* 2.4*   No results for input(s): "LIPASE", "AMYLASE" in the last 168 hours. Recent Labs  Lab 01/01/22 0930  AMMONIA 24    CBC:  Recent Labs  Lab 01/03/22 0345 01/04/22 0323 01/05/22 0342 01/06/22 0409 01/07/22 0357  WBC 11.3* 10.2 8.8 9.1 9.5  HGB 9.5* 9.3* 9.4* 8.9* 9.7*  HCT 30.2* 28.8* 29.3* 28.2* 30.4*  MCV 80.7 81.4 80.9 82.0 82.6  PLT 242 271 289 303 319    Cardiac Enzymes: No results for input(s): "CKTOTAL", "CKMB", "CKMBINDEX", "TROPONINI" in the last 168 hours.  BNP: BNP (last 3 results) Recent Labs    07/24/21 1433 10/11/21 1230 12/27/21 1451  BNP 3,184.1* 400.8* >4,500.0*    ProBNP (last 3 results) No results for input(s): "PROBNP" in the last 8760 hours.    Other results:  Imaging: [place] EKG SITE RITE  Result Date: 01/06/2022 If Site Rite image not attached, placement could not be confirmed due to current cardiac rhythm.    Medications:     Scheduled Medications:  amiodarone  200 mg Oral Daily   Chlorhexidine Gluconate Cloth  6 each Topical Daily   dapagliflozin propanediol  10 mg Oral Daily   digoxin  0.125 mg Oral Daily   feeding supplement  237 mL Oral  TID BM   furosemide  40 mg Oral Daily   midodrine  5 mg Per Tube TID WC   Warfarin - Pharmacist Dosing Inpatient   Does not apply q1600    Infusions:  sodium chloride 10 mL/hr at 01/06/22 0901   sodium chloride Stopped (01/07/22 0542)   ceFEPime (MAXIPIME) IV 200 mL/hr at 01/07/22 0600   heparin 1,750 Units/hr (01/07/22 0600)   magnesium sulfate bolus IVPB     vancomycin Stopped (01/07/22 0255)    PRN Medications: sodium chloride, sodium chloride, acetaminophen (TYLENOL) oral liquid 160 mg/5 mL, ipratropium-albuterol, mouth rinse   Assessment/Plan:   1. Mixed cardiogenic/septic shock - echo 12/28/21 LVEF 10% RV sev HK - TEE 7/15 severe biventricular failure. + vegetation on mechanical MVR - Bcx - NGTD - Respiratory culture w/ moderate WBCs (mononuclear and PMN) and no organisms  - Cultures no growth.  - ID following. Continue vanc/cefepime. Now AF - Off inotropes/pressors. MAPs stable on midodrine 5 TID.  2. 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 Repeat echo as above.  - PTA Spironolactone, Farxiga, Digoxin and Losartan currently held in the setting of shock and his AKI.  - Echo this admit LVEF 10% RV severely HK.  - Off milrinone and NE. Check CO-OX today. - CVP 5. Continue furosemide 40 mg daily - Continue Farxiga 10 mg daily - Continue digoxin - No BP room to titrate GDMT. On midodrine 5 TID.  - Consider spiro next one BP allows. - Long-term only option is transplant. Dr. 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 They will discuss.    3. 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  - Underwent MVR with  mechanical mitral valve in 05/2021 at [facility].  - TEE 7/15 severe biventricular failure. + vegetation on mechanical MVR - Bcx NGTD. -> on vanc/cefepime. 6 weeks IV abx, end date 08/24.  - Getting PICC today. - ID following. Appreciate assistance. - Continue heparin. INR 1.3.  Continue warfarin   4. Acute hypoxic respiratory failure - intubated 7/15 in setting of shock -  Extubated 719  - O2 stable on RA. Resolved.  5. Paroxysmal Atrial Fibrillation - In SR  - does not tolerate AF - liver enzymes continue to improve. - back on amio 200 daily  5. AKI - Due to ATN/shock - Baseline creatinine 0.98 - 1.0.  - Peak 2.6. Resolved.   6. Shock liver - continue supportive care - AST /ALT continue trend down.   7. Autoimmune Disorder  - He has seronegative spondylitis and pyoderma gangrenosum. Had been on Humira. - Followed by Rheumatology as an outpatient.   8. Hypomagnesemia/hypokalemia - Mag 1.7. Give 4 gm mag IV today. - K 4.2  9. Deconditioning - PT/OT following. Improving. No longer needs CIR. - Continue to mobilize. CR consult.     Length of Stay: 10  FINCH, LINDSAY N, PA-C  7:02 AM   01/07/2022, 7:02 AM  Advanced Heart Failure Team Pager [phone] (M-F; 7a - 4p)  Please contact [facility] for night-coverage after hours (4p -7a ) and weekends on amion.  Patient seen and examined with the above-signed Advanced Practice Provider and/or Housestaff. I personally reviewed laboratory data, imaging studies and relevant notes. I independently examined the patient and formulated the important aspects of the plan. I have edited the note to reflect any of my changes or salient points. I have personally discussed the plan with the patient and/or family.  Off milrinone and NE. Remains on IV abx. Afebrile. BP soft but stable. Volume status ok. On heparin/warfarin for mech MVR  General:  Well appearing. No resp difficulty HEENT: normal Neck: supple. no JVD. Carotids 2+ bilat;  no bruits. No lymphadenopathy or thryomegaly appreciated. Cor: PMI nondisplaced. Regular rate & rhythm. Mech s1 No rubs, gallops or murmurs. Lungs: clear Abdomen: soft, nontender, nondistended. No hepatosplenomegaly. No bruits or masses. Good bowel sounds. Extremities: no cyanosis, clubbing, rash, edema + pyoderma lesions Neuro: alert & orientedx3, cranial nerves grossly intact. moves all 4 extremities w/o difficulty. Affect pleasant  BP soft but otherwise stable. Continue IV abx. Continue heparin/warfarin. Needs PICC for home abx soon. Hold diuretics today. Can go to the floor.   Arvilla Meres, MD  7:39 AM

## 2022-01-07 NOTE — Progress Notes (Signed)
FM intern text-paged for mag replacement. Awaiting further orders.

## 2022-01-07 NOTE — Progress Notes (Signed)
    [facility]   Reason for visit: Follow up on MV endocarditis  Interval History: no acute events; WBC 9.5.      Physical Exam: Constitutional:  Vitals:   01/07/22 1200 01/07/22 1300  BP: 111/77 106/80  Pulse: 71 73  Resp: (!) 29 (!) 29  Temp:    SpO2: 100% 99%   patient appears in NAD Respiratory: Normal respiratory effort; CTA B Cardiovascular: RRR GI: soft, nt, nd  Review of Systems: Constitutional: negative for fevers and chills  Lab Results  Component Value Date   WBC 9.5 01/07/2022   HGB 9.7 (L) 01/07/2022   HCT 30.4 (L) 01/07/2022   MCV 82.6 01/07/2022   PLT 319 01/07/2022    Lab Results  Component Value Date   CREATININE 0.84 01/07/2022   BUN 22 (H) 01/07/2022   NA 132 (L) 01/07/2022   K 4.2 01/07/2022   CL 102 01/07/2022   CO2 24 01/07/2022    Lab Results  Component Value Date   ALT 375 (H) 01/07/2022   AST 317 (H) 01/07/2022   ALKPHOS 128 (H) 01/07/2022     Microbiology: Recent Results (from the past 240 hour(s))  Culture, Respiratory w Gram Stain     Status: None   Collection Time: 12/29/21  9:00 AM   Specimen: Tracheal Aspirate; Respiratory  Result Value Ref Range Status   Specimen Description TRACHEAL ASPIRATE  Final   Special Requests NONE  Final   Gram Stain   Final    MODERATE WBC PRESENT,BOTH PMN AND MONONUCLEAR NO ORGANISMS SEEN    Culture   Final    NO GROWTH 2 DAYS Performed at [facility], [address], [place], [place] [address]    Report Status 12/31/2021 FINAL  Final  Culture, blood (Routine X 2) w Reflex to ID Panel     Status: None   Collection Time: 12/29/21  9:58 AM   Specimen: BLOOD LEFT HAND  Result Value Ref Range Status   Specimen Description BLOOD LEFT HAND  Final   Special Requests   Final    BOTTLES DRAWN AEROBIC AND ANAEROBIC Blood Culture adequate volume   Culture   Final    NO GROWTH 5 DAYS Performed at [facility], [address] [place], [place] [address]     Report Status 01/03/2022 FINAL  Final  Culture, blood (Routine X 2) w Reflex to ID Panel     Status: None   Collection Time: 12/29/21  9:58 AM   Specimen: BLOOD LEFT ARM  Result Value Ref Range Status   Specimen Description BLOOD LEFT ARM  Final   Special Requests   Final    BOTTLES DRAWN AEROBIC AND ANAEROBIC Blood Culture results may not be optimal due to an inadequate volume of blood received in culture bottles   Culture   Final    NO GROWTH 5 DAYS Performed at [facility], [address] [place], [place] [address]    Report Status 01/03/2022 FINAL  Final    Impression/Plan:  1. Prosthetic valve endocarditis - negative cultures and plan for 6 weeks of vancomycin + cefepime. No changes and OPAT completed.  2.  Access - getting picc line today.    3.  Medication monitoring - creat stable at 0.84 on vancomycin.  No changes.   I will sign off, call with questions.

## 2022-01-07 NOTE — Progress Notes (Addendum)
Daily Progress Note Intern Pager: [phone]  Patient name: Donald [name] Medical record number: [id number] Date of birth: 06-12-1986 Age: 36 y.o. Gender: male  Primary Care Provider: No primary care provider on file. Consultants: PCCM, ID, cardiology, pharmacy Code Status: Full   Pt Overview and Major Events to Date:  -7/14 admitted FPTS for sepsis, N/V. Started on flagyl, cefepime, vanc -7/15 worsening shock> ICU> intubated, pressors, abx. Flagyl stopped -7/16 decreasing milrinone for high CO and co-ox -7/19 extubated -7/21 drop in cardiac index, Levophed restarted, Swan discontinued -7/22 levophed stopped -7/23 milrinone stopped -7/24 out to FMTS   Assessment and Plan:   Donald [name] is a 36 y.o. male who presented with nausea, vomiting, and diarrhea and found to have cardiogenic and septic shock with MV endocarditis. Pertinent PMH/PSH includes MR s/p MV mechanical valve on warfarin, biventricular HF, and PAF.   Endocarditis of prosthetic mitral valve (HCC) Stable on abx. Mechanical valve present, on wafarin at home. Now on heparin. Bcx on admission with no growth, though only 2 bottles obtained. -F/u PICC placement for long term abx -Continue vanc 750 mg Q8, cefepime 2 g Q8 until August 27th, 2023 -On heparin bridge with goal to transition to warfarin, INR 3 -Cardiology and ID following, appreciate recs  Shock, septic and cardiogenic Stable and improving out of ICU. On vanc, cefepime, midodrine. -Monitor VS -Continue abx per endocarditis -LFTs down trending, can continue med  Paroxysmal atrial fibrillation (HCC) Stable. On amiodarone 200 mg daily. -Cardiology following, appreciate recs -Monitor rate/rhythm control  Chronic heart failure (HCC) Stable. Echo with biventricular failure, EF this admission <20%. Weight downtrending over admission with lasix 40 mg PO. On digoxin 0.125 mg daily, farxiga 10 mg daily. -F/u cardiology recs, unsure of transplant  candidacy -Continue digoxin, farxiga, lasix -Monitor fluid overload  Microcytic anemia Chronic issues. Hgb at baseline.  -Monitor clinical status  Hypomagnesemia Mg on lower end of normal 1.7. Repleted 4g IV per cardiology. -AM Mg  FEN/GI: Thin diet, KVO NS fluids PPx: Heparin infusion Dispo: Home with HH (for IV abx) vs [facility] for transplant pending clinical improvement, cardiology recs  Subjective:  Sleeping soundly. Per nurse report has been doing well overnight. He was awake talking with folks for a large portion of the night.  Objective: Temp:  [97.7 F (36.5 C)-98.4 F (36.9 C)] 97.7 F (36.5 C) (07/24 2341) Pulse Rate:  [70-75] 72 (07/25 0700) Resp:  [14-28] 14 (07/25 0700) BP: (93-106)/(65-81) 102/81 (07/25 0700) SpO2:  [93 %-100 %] 99 % (07/25 0700) Weight:  [88.4 kg] 88.4 kg (07/25 0500) Physical Exam: General: Lying in bed, sleeping, in NAD Cardiovascular: RRR, no m/r/g Respiratory: CTAB anteriorly Abdomen: Nondistended Extremities: Warm, dry  Laboratory: Most recent CBC Lab Results  Component Value Date   WBC 9.5 01/07/2022   HGB 9.7 (L) 01/07/2022   HCT 30.4 (L) 01/07/2022   MCV 82.6 01/07/2022   PLT 319 01/07/2022   Most recent BMP    Latest Ref Rng & Units 01/07/2022    3:57 AM  BMP  Glucose 70 - 99 mg/dL 109   BUN 6 - 20 mg/dL 22   Creatinine 0.61 - 1.24 mg/dL 0.84   Sodium 135 - 145 mmol/L 132   Potassium 3.5 - 5.1 mmol/L 4.2   Chloride 98 - 111 mmol/L 102   CO2 22 - 32 mmol/L 24   Calcium 8.9 - 10.3 mg/dL 8.7       Latest Ref Rng & Units 01/07/2022  3:57 AM 01/06/2022    4:09 AM 01/04/2022    3:23 AM  Hepatic Function  Total Protein 6.5 - 8.1 g/dL 8.4  7.6  7.3   Albumin 3.5 - 5.0 g/dL 2.4  2.2  2.0   AST 15 - 41 U/L 317  407  910   ALT 0 - 44 U/L 375  498  826   Alk Phosphatase 38 - 126 U/L 128  115  143   Total Bilirubin 0.3 - 1.2 mg/dL 1.0  1.0  1.0   Bilirubin, Direct 0.0 - 0.2 mg/dL 0.3  0.2  0.3    Mg 1.7  [name],  MD 01/07/2022, 7:28 AM PGY-1, [place] Intern pager: [phone], text pages welcome Secure chat group [place]

## 2022-01-07 NOTE — Progress Notes (Signed)
Paged the FM intern's second listed number from Amion. Awaiting callback.

## 2022-01-07 NOTE — Progress Notes (Signed)
Pharmacy Antibiotic Note  Donald [name] is a 36 y.o. male for which pharmacy has been consulted for cefepime and vancomycin dosing for prosthetic valve endocarditis.   Patient with a history of mitral regurgitation s/p valve replacement on Coumadin, HF, AF and prior DVT, and Hx of hidradenitis and pyoderma gangrenosum . Now found to have a vegetation on mechanical mitral valve. Blood cultures are negative on 7/14 and 7/16. Scr stable at 0.84. Vanc levels today showed a peak of 37 and a trough of 20 for a supratherapeutic AUC of 708.   Plan: Reduce Vancomycin to 750 mg every 12 hours Predicted AUC 471  Continue cefepime 2 gm IV Q 8 hours  F/u renal function and clinical course Will update OPAT   Height: 5\' 10"  (177.8 cm) Weight: 88.4 kg (194 lb 14.2 oz) IBW/kg (Calculated) : 73  Temp (24hrs), Avg:98.2 F (36.8 C), Min:97.7 F (36.5 C), Max:98.7 F (37.1 C)  Recent Labs  Lab 12/31/21 1238 01/01/22 0346 01/03/22 0345 01/04/22 0323 01/05/22 0342 01/06/22 0409 01/07/22 0357 01/07/22 0955  WBC  --    < > 11.3* 10.2 8.8 9.1 9.5  --   CREATININE  --    < > 0.64 0.69 0.74 0.75 0.84  --   VANCOTROUGH  --   --   --   --   --   --   --  20  VANCOPEAK 29*  --   --   --   --   --  37  --    < > = values in this interval not displayed.     Estimated Creatinine Clearance: 140.1 mL/min (by C-G formula based on SCr of 0.84 mg/dL).    No Known Allergies  Antimicrobials this admission: cefepime 7/14 >> (8/24) vancomycin 7/14 >> (8/24) metronidazole 7/14 >> 7/14  Microbiology results: *FYI it looks like only 1 cx on 7/14 prior to abx - several orders "cancelled" 7/15 BCx x 2 > ngf 7/14 BCx  ngf 7/16 Resp Cx > ngf   Thank you for allowing pharmacy to be a part of this patient's care.  8/16, PharmD, BCPS, BCIDP Infectious Diseases Clinical Pharmacist Phone: [phone] 01/07/2022 12:24 PM

## 2022-01-07 NOTE — Progress Notes (Addendum)
PHARMACY CONSULT NOTE FOR:  OUTPATIENT  PARENTERAL ANTIBIOTIC THERAPY (OPAT)  Indication: Culture negative prosthetic mitral valve endocarditis  Regimen: Vancomycin 750 mg IV Q 12 hours + Cefepime 2 gm IV Q 8 hours  End date: 02/06/22    IV antibiotic discharge orders are pended. To discharging provider:  please sign these orders via discharge navigator,  Select New Orders & click on the button choice - Manage This Unsigned Work.     Thank you for allowing pharmacy to be a part of this patient's care.  Sharin Mons, PharmD, BCPS, BCIDP Infectious Diseases Clinical Pharmacist Phone: [phone] 01/07/2022, 12:28 PM

## 2022-01-07 NOTE — Progress Notes (Addendum)
FM intern text paged to clarify 1g mag sulfate order.   Pt received 4g yesterday for mag 1.7, got lasix, peed ~3-4L, and mag is 1.7 again this AM. Clarifying if 1g replacement is enough to keep pt at goal Mag ~2.   Awaiting further instruction.   Peyton Bottoms, RN. 6:25 AM 01/07/22   Addendum: Per intern, he will confer with his attending. Per MD, will hold until clarified.  6:31 AM

## 2022-01-07 NOTE — Progress Notes (Signed)
ANTICOAGULATION CONSULT NOTE  Pharmacy Consult for heparin>>warfarin Indication:  MVR /AFib  No Known Allergies  Patient Measurements: Height: 5\' 10"  (177.8 cm) Weight: 88.4 kg (194 lb 14.2 oz) IBW/kg (Calculated) : 73 Heparin Dosing Weight: 90.7 kg  Vital Signs: Temp: 97.7 F (36.5 C) (07/24 2341) Temp Source: Oral (07/24 2341) BP: 102/81 (07/25 0700) Pulse Rate: 72 (07/25 0700)  Labs: Recent Labs    01/05/22 0342 01/05/22 1235 01/05/22 2110 01/06/22 0409 01/07/22 0357  HGB 9.4*  --   --  8.9* 9.7*  HCT 29.3*  --   --  28.2* 30.4*  PLT 289  --   --  303 319  LABPROT 15.1  --   --  15.6* 16.4*  INR 1.2  --   --  1.3* 1.3*  HEPARINUNFRC 0.99*   < > 0.56 0.54 0.48  CREATININE 0.74  --   --  0.75 0.84   < > = values in this interval not displayed.     Estimated Creatinine Clearance: 140.1 mL/min (by C-G formula based on SCr of 0.84 mg/dL).   Medical History: Past Medical History:  Diagnosis Date   Anemia    Autoimmune disorder (HCC)    pyoderma gangrenosum   CHF (congestive heart failure) (HCC)    Chronic systolic heart failure (HCC)    a. EF 35-40% by echo in 07/2018 b. EF at 45% by repeat echo in 04/2020   DVT (deep venous thrombosis) (HCC)    h/o   Dysrhythmia    Mitral regurgitation    a. 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 b. 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HCC)    arthritis   Tricuspid regurgitation    Assessment: 11 yom with a history of mitral regurgitation s/p valve replacement on Coumadin, HF, AF and prior DVT, and Hx of hidradenitis and pyoderma gangrenosum .   Patient taking warfarin pta. Home dose is 4mg  daily per last [facility] note. INR 1.4 on 7/10 instructed to take 6mg  that night and then continue 4mg  daily.  Last taken 7/14.  Heparin level at goal this am 0.48 on heparin drip rate 1750 units/hr - running through peripheral IV and labs drawn from CL. No bleeding noted, hemoglobin stable 9s pltc 300s.   Warfarin resumed 7/21, INR still low at 1.3. Picc planned for today.   LFTs also down 4000> 300 amiodarone resumed.   Goal of Therapy:  INR goal 2.5-3.5 Heparin level: 0.3-0.7 units/mL (targeting low end of goal range) Monitor platelets by anticoagulation protocol: Yes   Plan:  Continue heparin to 1750 units/hr Warfarin 7.5 mg tonight Daily INR, heparin level, and cbc  PharmD., BCPS Clinical Pharmacist 01/07/2022 7:22 AM

## 2022-01-08 ENCOUNTER — Telehealth (HOSPITAL_COMMUNITY): Payer: Self-pay | Admitting: Pharmacy Technician

## 2022-01-08 ENCOUNTER — Other Ambulatory Visit (HOSPITAL_COMMUNITY): Payer: Self-pay

## 2022-01-08 DIAGNOSIS — N179 Acute kidney failure, unspecified: Secondary | ICD-10-CM | POA: Diagnosis not present

## 2022-01-08 DIAGNOSIS — Z7901 Long term (current) use of anticoagulants: Secondary | ICD-10-CM | POA: Diagnosis not present

## 2022-01-08 DIAGNOSIS — Z952 Presence of prosthetic heart valve: Secondary | ICD-10-CM | POA: Diagnosis not present

## 2022-01-08 DIAGNOSIS — T826XXA Infection and inflammatory reaction due to cardiac valve prosthesis, initial encounter: Secondary | ICD-10-CM | POA: Diagnosis not present

## 2022-01-08 DIAGNOSIS — K529 Noninfective gastroenteritis and colitis, unspecified: Secondary | ICD-10-CM | POA: Diagnosis not present

## 2022-01-08 DIAGNOSIS — I429 Cardiomyopathy, unspecified: Secondary | ICD-10-CM

## 2022-01-08 DIAGNOSIS — I38 Endocarditis, valve unspecified: Secondary | ICD-10-CM | POA: Diagnosis not present

## 2022-01-08 DIAGNOSIS — I480 Paroxysmal atrial fibrillation: Secondary | ICD-10-CM | POA: Diagnosis not present

## 2022-01-08 DIAGNOSIS — R570 Cardiogenic shock: Secondary | ICD-10-CM | POA: Diagnosis not present

## 2022-01-08 DIAGNOSIS — L88 Pyoderma gangrenosum: Secondary | ICD-10-CM | POA: Diagnosis not present

## 2022-01-08 DIAGNOSIS — I5022 Chronic systolic (congestive) heart failure: Secondary | ICD-10-CM | POA: Diagnosis not present

## 2022-01-08 LAB — COMPREHENSIVE METABOLIC PANEL
ALT: 294 U/L — ABNORMAL HIGH (ref 0–44)
AST: 240 U/L — ABNORMAL HIGH (ref 15–41)
Albumin: 2.5 g/dL — ABNORMAL LOW (ref 3.5–5.0)
Alkaline Phosphatase: 129 U/L — ABNORMAL HIGH (ref 38–126)
Anion gap: 5 (ref 5–15)
BUN: 18 mg/dL (ref 6–20)
CO2: 24 mmol/L (ref 22–32)
Calcium: 9.1 mg/dL (ref 8.9–10.3)
Chloride: 103 mmol/L (ref 98–111)
Creatinine, Ser: 0.84 mg/dL (ref 0.61–1.24)
GFR, Estimated: >60 mL/min (ref >60)
Glucose, Bld: 86 mg/dL (ref 70–99)
Potassium: 4.2 mmol/L (ref 3.5–5.1)
Sodium: 132 mmol/L — ABNORMAL LOW (ref 135–145)
Total Bilirubin: 1 mg/dL (ref 0.3–1.2)
Total Protein: 8.7 g/dL — ABNORMAL HIGH (ref 6.5–8.1)

## 2022-01-08 LAB — GLUCOSE, CAPILLARY
Glucose-Capillary: 107 mg/dL — ABNORMAL HIGH (ref 70–99)
Glucose-Capillary: 82 mg/dL (ref 70–99)
Glucose-Capillary: 92 mg/dL (ref 70–99)
Glucose-Capillary: 94 mg/dL (ref 70–99)
Glucose-Capillary: 95 mg/dL (ref 70–99)

## 2022-01-08 LAB — PROTIME-INR
INR: 1.4 — ABNORMAL HIGH (ref 0.8–1.2)
Prothrombin Time: 17 seconds — ABNORMAL HIGH (ref 11.4–15.2)

## 2022-01-08 LAB — CBC
HCT: 30.9 % — ABNORMAL LOW (ref 39.0–52.0)
Hemoglobin: 9.9 g/dL — ABNORMAL LOW (ref 13.0–17.0)
MCH: 26.2 pg (ref 26.0–34.0)
MCHC: 32 g/dL (ref 30.0–36.0)
MCV: 81.7 fL (ref 80.0–100.0)
Platelets: 357 10*3/uL (ref 150–400)
RBC: 3.78 MIL/uL — ABNORMAL LOW (ref 4.22–5.81)
RDW: 18.1 % — ABNORMAL HIGH (ref 11.5–15.5)
WBC: 8.6 10*3/uL (ref 4.0–10.5)
nRBC: 0 % (ref 0.0–0.2)

## 2022-01-08 LAB — HEPARIN LEVEL (UNFRACTIONATED): Heparin Unfractionated: 0.55 IU/mL (ref 0.30–0.70)

## 2022-01-08 LAB — MAGNESIUM: Magnesium: 1.7 mg/dL (ref 1.7–2.4)

## 2022-01-08 MED ORDER — CEFEPIME IV (FOR PTA / DISCHARGE USE ONLY): 2.0000 g | Freq: Three times a day (TID) | INTRAVENOUS | 0 refills | Status: AC

## 2022-01-08 MED ORDER — ENOXAPARIN SODIUM 80 MG/0.8ML IJ SOSY
80.0000 mg | PREFILLED_SYRINGE | Freq: Two times a day (BID) | INTRAMUSCULAR | Status: DC
  Administered 2022-01-08 – 2022-01-12 (×9): 80 mg via SUBCUTANEOUS
  Filled 2022-01-08 (×9): qty 0.8

## 2022-01-08 MED ORDER — VANCOMYCIN IV (FOR PTA / DISCHARGE USE ONLY): 750.0000 mg | Freq: Two times a day (BID) | INTRAVENOUS | 0 refills | Status: DC

## 2022-01-08 MED ORDER — WARFARIN SODIUM 10 MG PO TABS
10.0000 mg | ORAL_TABLET | Freq: Once | ORAL | Status: AC
  Administered 2022-01-08: 10 mg via ORAL
  Filled 2022-01-08 (×2): qty 1

## 2022-01-08 MED ORDER — ENOXAPARIN SODIUM 80 MG/0.8ML IJ SOSY
80.0000 mg | PREFILLED_SYRINGE | Freq: Once | INTRAMUSCULAR | Status: AC
Start: 2022-01-08 — End: 2022-01-08
  Filled 2022-01-08: qty 0.8

## 2022-01-08 MED ORDER — MAGNESIUM SULFATE 4 GM/100ML IV SOLN
4.0000 g | Freq: Once | INTRAVENOUS | Status: AC
  Administered 2022-01-08: 4 g via INTRAVENOUS
  Filled 2022-01-08: qty 100

## 2022-01-08 NOTE — Progress Notes (Signed)
Physical Therapy Treatment Patient Details Name: Donald [name] MRN: [id number] DOB: 1986-05-17 Today's Date: 01/08/2022   History of Present Illness Pt is a 36 y.o. 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 Transfer to ICU 7/15 with cardiogenic and septic shock; ETT 7/15-7/19. 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    PT Comments    Pt received OOB in recliner and agreeable to session with continued focus on higher level balance tasks. Pt continues to demonstrate stable ambulation without assist and no AD with no LOBE. Pt requiring up min guard for retro stepping, side stepping and braiding with pt able to self correct all LOB without assist. Pt cognition continues to improve with pt focused throughout session following all commands and staying on task. Pt continues to benefit from skilled PT services to progress toward functional mobility goals.     Recommendations for follow up therapy are one component of a multi-disciplinary discharge planning process, led by the attending physician.  Recommendations may be updated based on patient status, additional functional criteria and insurance authorization.  Follow Up Recommendations  No PT follow up     Assistance Recommended at Discharge Intermittent Supervision/Assistance  Patient can return home with the following Assistance with cooking/housework;Direct supervision/assist for medications management;Direct supervision/assist for financial management;Assist for transportation   Equipment Recommendations  None recommended by PT    Recommendations for Other Services       Precautions / Restrictions Precautions Precautions: Fall Restrictions Weight Bearing Restrictions: No     Mobility  Bed Mobility Overal bed mobility: Modified Independent Bed Mobility: Supine to Sit           General bed mobility  comments: able to get to EOB without assistance    Transfers Overall transfer level: Needs assistance Equipment used: None Transfers: Sit to/from Stand Sit to Stand: Supervision           General transfer comment: suerpvision for safety    Ambulation/Gait Ambulation/Gait assistance: Supervision Gait Distance (Feet): 800 Feet Assistive device: None Gait Pattern/deviations: Step-through pattern, Decreased stride length       General Gait Details: slow, steady gait without DME, no overt instability or LOB   Stairs             Wheelchair Mobility    Modified Rankin (Stroke Patients Only)       Balance Overall balance assessment: Needs assistance Sitting-balance support: No upper extremity supported Sitting balance-Leahy Scale: Good     Standing balance support: No upper extremity supported, During functional activity Standing balance-Leahy Scale: Good Standing balance comment: able to static stand without UE support                            Cognition Arousal/Alertness: Awake/alert Behavior During Therapy: WFL for tasks assessed/performed, Flat affect Overall Cognitive Status: Within Functional Limits for tasks assessed Area of Impairment: Attention, Awareness                   Current Attention Level: Selective Memory: Decreased short-term memory Following Commands: Follows one step commands consistently Safety/Judgement: Decreased awareness of safety, Decreased awareness of deficits Awareness: Emergent Problem Solving: Slow processing, Requires verbal cues General Comments: able to follow directions with increased safety        Exercises Other Exercises Other Exercises: retro stepping, lateral stepping, braiding stepping with minimal LOB with pt able to self correct in  all instances, gait with head turns R/L and up/down without LOB Other Exercises: STS x5 without UE support Other Exercises: SLS with various success, x10 trials  on each side    General Comments General comments (skin integrity, edema, etc.): VSS on RA      Pertinent Vitals/Pain Pain Assessment Pain Assessment: No/denies pain    Home Living                          Prior Function            PT Goals (current goals can now be found in the care plan section) Acute Rehab PT Goals PT Goal Formulation: With family Time For Goal Achievement: 01/16/22    Frequency    Min 2X/week      PT Plan      Co-evaluation              AM-PAC PT "6 Clicks" Mobility   Outcome Measure  Help needed turning from your back to your side while in a flat bed without using bedrails?: None Help needed moving from lying on your back to sitting on the side of a flat bed without using bedrails?: None Help needed moving to and from a bed to a chair (including a wheelchair)?: A Little Help needed standing up from a chair using your arms (e.g., wheelchair or bedside chair)?: A Little Help needed to walk in hospital room?: A Little Help needed climbing 3-5 steps with a railing? : A Little 6 Click Score: 20    End of Session   Activity Tolerance: Patient tolerated treatment well Patient left: in chair;with call bell/phone within reach;with family/visitor present Nurse Communication: Mobility status PT Visit Diagnosis: Other abnormalities of gait and mobility (R26.89);Difficulty in walking, not elsewhere classified (R26.2);Muscle weakness (generalized) (M62.81)     Time: 1210-1228 PT Time Calculation (min) (ACUTE ONLY): 18 min  Charges:  $Therapeutic Activity: 8-22 mins                     Donald [name] R. PTA Acute Rehabilitation Services Office: [phone]    Catalina Antigua 01/08/2022, 12:47 PM

## 2022-01-08 NOTE — Telephone Encounter (Signed)
Pharmacy Patient Advocate Encounter  Insurance verification completed.    The patient is insured through [facility] [place]   The patient is currently admitted and ran test claims for the following: enoxaparin (Lovenox) 80 mg/0.8 ml injection.  Copays and coinsurance results were relayed to Inpatient clinical team.

## 2022-01-08 NOTE — Progress Notes (Signed)
Removed CVC left subclavian per order. Patient tolerated well.   Kenard Gower, RN

## 2022-01-08 NOTE — Progress Notes (Signed)
01/07/2022 2230 Received pt to room 4E-03 from 2H.  Pt is A&O, no C/O voiced.  Tele monitor applied and CCMD notified.  CHG bath given.  Oriented to room, call light and bed.  Call bell in reach. Kathryne Hitch

## 2022-01-08 NOTE — Progress Notes (Signed)
Seen pt from 1445-1453, pt refused ambulation due to being tired. Spent time explaining role and plan for tomorrow (ambulate + educate). Encouraged pt to continue ambulating throughout day.  [name] 01/08/2022 2:55 PM

## 2022-01-08 NOTE — Progress Notes (Signed)
Occupational Therapy Treatment Patient Details Name: Donald [name] MRN: [id number] DOB: 05/18/1986 Today's Date: 01/08/2022   History of present illness Pt is a 36 y.o. 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 Transfer to ICU 7/15 with cardiogenic and septic shock; ETT 7/15-7/19. 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   OT comments  Patient received in supine and agreeable to OT session. Patient able to get to EOB without assistance and ambulated to bathroom without an assistive device. Patient able to stand for toileting and grooming standing at sink with supervision for safety. Patient able to change socks seated in recliner. Patient provided written HEP and therapy band instructed in exercises. Patient continues to make good gains and will continue to be followed by acute OT.    Recommendations for follow up therapy are one component of a multi-disciplinary discharge planning process, led by the attending physician.  Recommendations may be updated based on patient status, additional functional criteria and insurance authorization.    Follow Up Recommendations  No OT follow up    Assistance Recommended at Discharge Intermittent Supervision/Assistance  Patient can return home with the following  A little help with walking and/or transfers;Assistance with cooking/housework;Help with stairs or ramp for entrance;Assist for transportation;Direct supervision/assist for financial management;Direct supervision/assist for medications management;A little help with bathing/dressing/bathroom   Equipment Recommendations  None recommended by OT    Recommendations for Other Services      Precautions / Restrictions Precautions Precautions: Fall Restrictions Weight Bearing Restrictions: No       Mobility Bed Mobility Overal bed mobility: Modified Independent Bed Mobility:  Supine to Sit           General bed mobility comments: able to get to EOB without assistance    Transfers Overall transfer level: Needs assistance Equipment used: None Transfers: Sit to/from Stand Sit to Stand: Supervision           General transfer comment: min guard for safety and verbal cues     Balance Overall balance assessment: Needs assistance Sitting-balance support: No upper extremity supported Sitting balance-Leahy Scale: Good     Standing balance support: No upper extremity supported, During functional activity Standing balance-Leahy Scale: Good Standing balance comment: stood at sink for grooming and in bathroom without UE support                           ADL either performed or assessed with clinical judgement   ADL Overall ADL's : Needs assistance/impaired     Grooming: Wash/dry hands;Wash/dry face;Oral care;Supervision/safety;Standing Grooming Details (indicate cue type and reason): at sink         Upper Body Dressing : Supervision/safety;Sitting Upper Body Dressing Details (indicate cue type and reason): changed socks     Toilet Transfer: Min guard;Ambulation Toilet Transfer Details (indicate cue type and reason): min guard for safety Toileting- Clothing Manipulation and Hygiene: Supervision/safety Toileting - Clothing Manipulation Details (indicate cue type and reason): standing at toilet to urinate       General ADL Comments: provided clean socks and required cues to initate    Extremity/Trunk Assessment              Vision       Perception     Praxis      Cognition Arousal/Alertness: Awake/alert Behavior During Therapy: WFL for tasks assessed/performed, Flat affect Overall Cognitive Status: Within Functional Limits for tasks  assessed Area of Impairment: Attention, Awareness                   Current Attention Level: Selective Memory: Decreased short-term memory Following Commands: Follows one step  commands consistently Safety/Judgement: Decreased awareness of safety, Decreased awareness of deficits Awareness: Emergent Problem Solving: Slow processing, Requires verbal cues General Comments: able to follow directions with increased safety        Exercises Exercises: General Upper Extremity General Exercises - Upper Extremity Shoulder Flexion: Strengthening, Both, 15 reps, Seated, Theraband Theraband Level (Shoulder Flexion): Level 3 (Green) Shoulder ABduction: Strengthening, Both, 15 reps, Seated, Theraband Theraband Level (Shoulder Abduction): Level 3 (Green) Elbow Flexion: Strengthening, Both, 15 reps, Seated, Theraband Theraband Level (Elbow Flexion): Level 3 (Green) Elbow Extension: Strengthening, Both, 15 reps, Seated, Theraband Theraband Level (Elbow Extension): Level 3 (Green)    Shoulder Instructions       General Comments      Pertinent Vitals/ Pain       Pain Assessment Pain Assessment: Faces Faces Pain Scale: No hurt Pain Intervention(s): Monitored during session  Home Living                                          Prior Functioning/Environment              Frequency  Min 2X/week        Progress Toward Goals  OT Goals(current goals can now be found in the care plan section)  Progress towards OT goals: Progressing toward goals  Acute Rehab OT Goals Patient Stated Goal: go home OT Goal Formulation: With patient Time For Goal Achievement: 01/17/22 Potential to Achieve Goals: Good ADL Goals Pt Will Perform Grooming: with supervision;standing Pt Will Perform Upper Body Dressing: sitting;with set-up Pt Will Perform Lower Body Dressing: with min guard assist;sit to/from stand Pt Will Transfer to Toilet: with supervision;ambulating;regular height toilet Pt/caregiver will Perform Home Exercise Program: Increased strength;Both right and left upper extremity;With theraband;With Supervision  Plan Discharge plan remains appropriate     Co-evaluation                 AM-PAC OT "6 Clicks" Daily Activity     Outcome Measure   Help from another person eating meals?: None Help from another person taking care of personal grooming?: A Little Help from another person toileting, which includes using toliet, bedpan, or urinal?: A Little Help from another person bathing (including washing, rinsing, drying)?: A Little Help from another person to put on and taking off regular upper body clothing?: A Little Help from another person to put on and taking off regular lower body clothing?: A Little 6 Click Score: 19    End of Session Equipment Utilized During Treatment: Gait belt  OT Visit Diagnosis: Unsteadiness on feet (R26.81);Muscle weakness (generalized) (M62.81);Other symptoms and signs involving cognitive function   Activity Tolerance Patient tolerated treatment well   Patient Left in chair;with family/visitor present;with call bell/phone within reach   Nurse Communication Mobility status        Time: 7353-2992 OT Time Calculation (min): 28 min  Charges: OT General Charges $OT Visit: 1 Visit OT Treatments $Self Care/Home Management : 8-22 mins $Therapeutic Exercise: 8-22 mins  Alfonse Flavors, OTA Acute Rehabilitation Services  Office [phone]   Dewain Penning 01/08/2022, 9:52 AM

## 2022-01-08 NOTE — TOC Benefit Eligibility Note (Signed)
Patient Product/process development scientist completed.    The patient is currently admitted and upon discharge could be taking enoxaparin (Lovenox) 80 mg/0.8 ml injection.  The current 5 day co-pay is $4.00.   The patient is insured through [facility] [place]     Roland Earl, CPhT Pharmacy Patient Advocate Specialist [facility] Patient Advocate Team Direct Number: [phone]  Fax: [phone]

## 2022-01-08 NOTE — Progress Notes (Signed)
Paged Heart failure PA and requested order from teaching service to get an order to remove pt CVC triple lumen before discharge tomorrow. Waiting on response.   PICC single lumen was placed 01/07/22 for home antibiotics. Patient transitioned from heparin gtt to lovenox today.   Kenard Gower, RN

## 2022-01-08 NOTE — Progress Notes (Addendum)
Advanced Heart Failure Rounding Note   Subjective:   7/19 Extubated. Diuresed with IV lasix. I/O not accurate. Norepi weaned off.  7/20 Swan out  7/23 Milrinone off 7/25 Co-ox stable off milrinone (69%)   ID following. On vanc/cefepime for pMVR endocarditis. BCx NGTD. Remains afebrile.  PICC placed yesterday for continuation of home IV abx.   BPs improved w/ midodrine, 110s systolic.   Diuretics held yesterday w/ low CVP (5). No current CVP setup after transfer out of ICU but wt is unchanged. Denies dyspnea.   Feels well today. No complaints. Denies fever/chills.   INR 1.4. remains on heparin gtt.    Objective:     Vital Signs:   Temp:  [98.3 F (36.8 C)-98.8 F (37.1 C)] 98.8 F (37.1 C) (07/26 0430) Pulse Rate:  [68-81] 72 (07/26 0430) Resp:  [17-37] 20 (07/26 0430) BP: (98-119)/(70-91) 119/86 (07/26 0430) SpO2:  [97 %-100 %] 99 % (07/26 0430) Weight:  [89 kg] 89 kg (07/26 0500) Last BM Date : 01/06/22  Weight change: Filed Weights   01/07/22 0500 01/07/22 2233 01/08/22 0500  Weight: 88.4 kg 89 kg 89 kg    Intake/Output:   Intake/Output Summary (Last 24 hours) at 01/08/2022 0806 Last data filed at 01/08/2022 5681 Gross per 24 hour  Intake 1385.55 ml  Output 3625 ml  Net -2239.45 ml   PHYSICAL EXAM: General:  Well appearing, young male. No respiratory difficulty HEENT: normal Neck: supple. JVD 6-7 cm. Carotids 2+ bilat; no bruits. No lymphadenopathy or thyromegaly appreciated. Cor: PMI nondisplaced. Regular rate & rhythm. + mechanical valve sounds  Lungs: clear Abdomen: soft, nontender, nondistended. No hepatosplenomegaly. No bruits or masses. Good bowel sounds. Extremities: no cyanosis, clubbing, rash, edema + RUE PICC  Neuro: alert & oriented x 3, cranial nerves grossly intact. moves all 4 extremities w/o difficulty. Affect pleasant.   Telemetry: SR 70s (personally reviewed)   Labs: Basic Metabolic Panel: Recent Labs  Lab 01/01/22 2018  01/01/22 2227 01/02/22 0501 01/03/22 0345 01/04/22 0323 01/05/22 0342 01/06/22 0409 01/07/22 0357 01/08/22 0525  NA 138   < > 135   < > 133* 137 134* 132* 132*  K 3.8   < > 3.9   < > 3.6 4.6 4.3 4.2 4.2  CL 105  --  104   < > 104 104 104 102 103  CO2 23  --  23   < > 25 28 25 24 24   GLUCOSE 119*  --  125*   < > 130* 106* 86 109* 86  BUN 10  --  13   < > 14 17 17  22* 18  CREATININE 0.63  --  0.74   < > 0.69 0.74 0.75 0.84 0.84  CALCIUM 7.8*  --  7.7*   < > 8.1* 8.5* 8.6* 8.7* 9.1  MG 1.5*  --   --   --  1.4* 1.6* 1.7 1.7 1.7  PHOS 1.7*  --  2.7  --   --  3.3  --   --   --    < > = values in this interval not displayed.    Liver Function Tests: Recent Labs  Lab 01/03/22 0345 01/04/22 0323 01/06/22 0409 01/07/22 0357 01/08/22 0525  AST 1,352* 910* 407* 317* 240*  ALT 1,123* 826* 498* 375* 294*  ALKPHOS 122 143* 115 128* 129*  BILITOT 1.3* 1.0 1.0 1.0 1.0  PROT 6.9 7.3 7.6 8.4* 8.7*  ALBUMIN 2.0* 2.0* 2.2* 2.4* 2.5*  No results for input(s): "LIPASE", "AMYLASE" in the last 168 hours. Recent Labs  Lab 01/01/22 0930  AMMONIA 24    CBC: Recent Labs  Lab 01/04/22 0323 01/05/22 0342 01/06/22 0409 01/07/22 0357 01/08/22 0525  WBC 10.2 8.8 9.1 9.5 8.6  HGB 9.3* 9.4* 8.9* 9.7* 9.9*  HCT 28.8* 29.3* 28.2* 30.4* 30.9*  MCV 81.4 80.9 82.0 82.6 81.7  PLT 271 289 303 319 357    Cardiac Enzymes: No results for input(s): "CKTOTAL", "CKMB", "CKMBINDEX", "TROPONINI" in the last 168 hours.  BNP: BNP (last 3 results) Recent Labs    07/24/21 1433 10/11/21 1230 12/27/21 1451  BNP 3,184.1* 400.8* >4,500.0*    ProBNP (last 3 results) No results for input(s): "PROBNP" in the last 8760 hours.    Other results:  Imaging: [place] EKG SITE RITE  Result Date: 01/06/2022 If Site Rite image not attached, placement could not be confirmed due to current cardiac rhythm.    Medications:     Scheduled Medications:  amiodarone  200 mg Oral Daily   Chlorhexidine Gluconate  Cloth  6 each Topical Daily   dapagliflozin propanediol  10 mg Oral Daily   digoxin  0.125 mg Oral Daily   feeding supplement  237 mL Oral TID BM   midodrine  5 mg Oral TID WC   sodium chloride flush  10-40 mL Intracatheter Q12H   Warfarin - Pharmacist Dosing Inpatient   Does not apply q1600    Infusions:  sodium chloride 10 mL/hr at 01/06/22 0901   sodium chloride 10 mL/hr at 01/07/22 2200   ceFEPime (MAXIPIME) IV 2 g (01/08/22 0528)   heparin 1,750 Units/hr (01/07/22 2200)   magnesium sulfate bolus IVPB     vancomycin 750 mg (01/07/22 2338)    PRN Medications: sodium chloride, sodium chloride, acetaminophen (TYLENOL) oral liquid 160 mg/5 mL, ipratropium-albuterol, mouth rinse, sodium chloride flush   Assessment/Plan:   1. Mixed cardiogenic/septic shock - echo 12/28/21 LVEF 10% RV sev HK - TEE 7/15 severe biventricular failure. + vegetation on mechanical MVR - Bcx - NGTD - Respiratory culture w/ moderate WBCs (mononuclear and PMN) and no organisms  - Cultures no growth.  - ID following. Continue vanc/cefepime. Now AF - Off inotropes/pressors. MAPs stable on midodrine 5 TID.  2. 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 Repeat echo as above.  - PTA Spironolactone, Farxiga, Digoxin and Losartan currently held in the setting of shock and his AKI.  - Echo this admit LVEF 10% RV severely HK.  - Off milrinone and NE. Co-ox stable  - Evuolemic on exam. Hold lasix again today. Will likely need loop diuretic at d/c - Continue Farxiga 10 mg daily - Continue digoxin 0.125  - No BP room to titrate GDMT. On midodrine 5 TID.  - Consider spiro next one BP allows. - Long-term only option is transplant. Dr. 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 They will discuss.    3. 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  - Underwent MVR with mechanical mitral valve in 05/2021 at [facility].  - TEE 7/15 severe biventricular failure. + vegetation on mechanical MVR - Bcx NGTD. -> on vanc/cefepime. 6  weeks IV abx, end date 08/24.  - Has PICC for home abx  - ID following. Appreciate assistance. - Continue heparin. INR 1.4.  Continue warfarin   4. Acute hypoxic respiratory failure - intubated 7/15 in setting of shock - Extubated 719  - O2 stable on RA. Resolved.  5. Paroxysmal Atrial Fibrillation - In SR  - does not tolerate AF - liver enzymes continue to improve. - back on amio 200 daily  5. AKI - Due to ATN/shock - Baseline creatinine 0.98 - 1.0.  - Peak 2.6. Resolved.   6. Shock liver - continue supportive care - AST /ALT continue trend down.   7. Autoimmune Disorder  - He has seronegative spondylitis and pyoderma gangrenosum. Had been on Humira. - Followed by Rheumatology as an outpatient.   8. Hypomagnesemia/hypokalemia - Mag 1.7. Give 4 gm mag IV today. - K 4.2  9. Deconditioning - PT/OT following. Improving. No longer needs CIR. - Continue to mobilize. CR consult.  Continue heparin until INR therapeutic, then plan d/c home.    Length of Stay: [address] PA-C  8:06 AM   01/08/2022, 8:06 AM  Advanced Heart Failure Team Pager [phone] (M-F; 7a - 4p)  Please contact [facility] for night-coverage after hours (4p -7a ) and weekends on amion.   Patient seen and examined with the above-signed Advanced Practice Provider and/or Housestaff. I personally reviewed laboratory data, imaging studies and relevant notes. I independently examined the patient and formulated the important aspects of the plan. I have edited the note to reflect any of my changes or salient points. I have  personally discussed the plan with the patient and/or family.  Feels ok. Denies SOB, orthopnea or PND. Remains on IV abx. Afebrile. PICC in  On heparin/warfarin. INR 1.4  Mag low  General: Sitting in chair  No resp difficulty HEENT: normal Neck: supple. no JVD. Carotids 2+ bilat; no bruits. No lymphadenopathy or thryomegaly appreciated. Cor: PMI nondisplaced. Regular rate & rhythm. Mech s1 Lungs: clear Abdomen: soft, nontender, nondistended. No hepatosplenomegaly. No bruits or masses. Good bowel sounds. Extremities: no cyanosis, clubbing, rash, edema Neuro: alert & orientedx3, cranial nerves grossly intact. moves all 4 extremities w/o difficulty. Affect pleasant  Much improved from shock perspective. Now off inotropes. Has PICC line in for pMV endocarditis. Now awaiting therapeutic INR for MVR prior to d/c. May be candidate for lovenox to help facilitate d/c. Will d/w PharmD.   Arvilla Meres, MD  8:31 AM

## 2022-01-08 NOTE — Progress Notes (Signed)
ANTICOAGULATION CONSULT NOTE  Pharmacy Consult for heparin>>> enoxaparin>warfarin Indication:  MVR /AFib  No Known Allergies  Patient Measurements: Height: 5\' 10"  (177.8 cm) Weight: 89 kg (196 lb 3 oz) IBW/kg (Calculated) : 73 Heparin Dosing Weight: 90.7 kg  Vital Signs: Temp: 97.8 F (36.6 C) (07/26 1135) Temp Source: Oral (07/26 1135) BP: 113/85 (07/26 1135) Pulse Rate: 74 (07/26 1135)  Labs: Recent Labs    01/06/22 0409 01/07/22 0357 01/08/22 0525  HGB 8.9* 9.7* 9.9*  HCT 28.2* 30.4* 30.9*  PLT 303 319 357  LABPROT 15.6* 16.4* 17.0*  INR 1.3* 1.3* 1.4*  HEPARINUNFRC 0.54 0.48 0.55  CREATININE 0.75 0.84 0.84     Estimated Creatinine Clearance: 140.5 mL/min (by C-G formula based on SCr of 0.84 mg/dL).   Medical History: Past Medical History:  Diagnosis Date   Anemia    Autoimmune disorder (HCC)    pyoderma gangrenosum   CHF (congestive heart failure) (HCC)    Chronic systolic heart failure (HCC)    a. EF 35-40% by echo in 07/2018 b. EF at 45% by repeat echo in 04/2020   DVT (deep venous thrombosis) (HCC)    h/o   Dysrhythmia    Mitral regurgitation    a. 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 b. 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HCC)    arthritis   Tricuspid regurgitation    Assessment: 22 yom with a history of mitral regurgitation s/p mechanical valve replacement on Warfarin, HF, AF and prior DVT, and Hx of hidradenitis and pyoderma gangrenosum .   Patient taking warfarin PTA 4mg  daily per last AC note. INR 1.4 on 7/10 instructed to take 6mg  that night and then continue 4mg  daily.  Heparin level 0.55 at goal this am on heparin drip rate 1750 units/hr - running through peripheral IV and labs drawn from CL. No bleeding  noted, hemoglobin stable 9s pltc 300s.  DC planning with enoxaparin 1mg /kg = 80mg  sq q12h bridge until INR at goal.   INR check set up for Monday afternoon Enoxaparin $4   Warfarin resumed 7/21, INR still low at 1.4 - will increase doses as Liver function improved and amiodarone dose lower.  Picc placed for IV ABX at home    Goal of Therapy:  INR goal 2.5-3.5 Anti Xa level 0.6-1.2 drawn 4hr after dose  Monitor platelets by anticoagulation protocol: Yes   Plan:  Stop heparin and begin enoxaparin 80mg  q12h Warfarin 10mg  tonight Daily INR, and cbc    Pharm.D. CPP, BCPS Clinical Pharmacist [phone] 01/08/2022 11:59 AM

## 2022-01-08 NOTE — Progress Notes (Signed)
Daily Progress Note Intern Pager: [phone]  Patient name: Donald [name] Medical record number: [id number] Date of birth: April 19, 1986 Age: 36 y.o. Gender: male  Primary Care Provider: No primary care provider on file. Consultants: PCCM, ID, cardiology, pharmacy Code Status: Full   Pt Overview and Major Events to Date:  -7/14 admitted FPTS for sepsis, N/V. Started on flagyl, cefepime, vanc -7/15 worsening shock> ICU> intubated, pressors, abx. Flagyl stopped -7/16 decreasing milrinone for high CO and co-ox -7/19 extubated -7/21 drop in cardiac index, Levophed restarted, Swan discontinued -7/22 levophed stopped -7/23 milrinone stopped -7/24 out to FMTS   Assessment and Plan:   Donald [name] is a 36 y.o. male who presented with nausea, vomiting, and diarrhea and found to have cardiogenic and septic shock with MV endocarditis. Pertinent PMH/PSH includes MR s/p MV mechanical valve on warfarin, biventricular HF, and PAF.   Endocarditis of prosthetic mitral valve (HCC) Stable on abx. Mechanical valve present, on wafarin at home. Now with heparin bridge. Bcx on admission with no growth, though only 2 bottles obtained. -Continue vanc 750 mg Q8, cefepime 2 g Q8 until August 27th, 2023 through newly-placed PICC -On heparin bridge with goal to transition to warfarin, INR 3 -Cardiology following, appreciate recs  Shock, septic and cardiogenic Resolved since out of ICU. On vanc, cefepime, midodrine. -Monitor VS -Continue abx per endocarditis  Paroxysmal atrial fibrillation (HCC) Stable. On amiodarone 200 mg daily. -Cardiology following, appreciate recs -Monitor rate/rhythm control -LFTs down trending, can continue med  Chronic heart failure (HCC) Stable. Echo with biventricular failure, EF this admission <20%. Weight downtrending over admission with lasix 40 mg PO. On digoxin 0.125 mg daily, farxiga 10 mg daily. -F/u cardiology recs, lasix administration, unsure of  transplant candidacy -Continue digoxin, farxiga -Monitor fluid overload  Microcytic anemia Chronic issues. Hgb at baseline.  -Monitor clinical status  FEN/GI: Thin diet, KVO NS fluids PPx: Heparin infusion Dispo: Home with HH (for IV abx) pending INR 3 on warfarin, pharmacy and cardiology to facilitate process  Subjective:  Doing well this morning without complaints. Has been moving around without difficulties.  Objective: Temp:  [98.2 F (36.8 C)-98.8 F (37.1 C)] 98.8 F (37.1 C) (07/26 0430) Pulse Rate:  [68-81] 72 (07/26 0430) Resp:  [17-37] 20 (07/26 0430) BP: (98-119)/(70-91) 119/86 (07/26 0430) SpO2:  [97 %-100 %] 99 % (07/26 0430) Weight:  [89 kg] 89 kg (07/26 0500) Physical Exam: General: Sitting in chair, in NAD Cardiovascular: Regular rate Respiratory: Breathing and speaking comfortably on RA Abdomen: Nondistended Extremities: Moves all extremities grossly equally  Laboratory: Most recent CBC Lab Results  Component Value Date   WBC 8.6 01/08/2022   HGB 9.9 (L) 01/08/2022   HCT 30.9 (L) 01/08/2022   MCV 81.7 01/08/2022   PLT 357 01/08/2022   Most recent BMP    Latest Ref Rng & Units 01/08/2022    5:25 AM  BMP  Glucose 70 - 99 mg/dL 86   BUN 6 - 20 mg/dL 18   Creatinine 6.14 - 1.24 mg/dL 4.31   Sodium 540 - 086 mmol/L 132   Potassium 3.5 - 5.1 mmol/L 4.2   Chloride 98 - 111 mmol/L 103   CO2 22 - 32 mmol/L 24   Calcium 8.9 - 10.3 mg/dL 9.1    Protime: 17 INR: 1.4 Mg: 1.7  Donald Holmes, MD 01/08/2022, 7:47 AM PGY-1, [facility] FPTS Intern pager: [phone], text pages welcome Secure chat group [facility] Teaching Service

## 2022-01-09 ENCOUNTER — Inpatient Hospital Stay (HOSPITAL_COMMUNITY): Payer: Medicaid Other

## 2022-01-09 ENCOUNTER — Other Ambulatory Visit (HOSPITAL_COMMUNITY): Payer: Self-pay

## 2022-01-09 DIAGNOSIS — I480 Paroxysmal atrial fibrillation: Secondary | ICD-10-CM | POA: Diagnosis not present

## 2022-01-09 DIAGNOSIS — M79602 Pain in left arm: Secondary | ICD-10-CM | POA: Diagnosis not present

## 2022-01-09 DIAGNOSIS — M7989 Other specified soft tissue disorders: Secondary | ICD-10-CM

## 2022-01-09 DIAGNOSIS — I38 Endocarditis, valve unspecified: Secondary | ICD-10-CM

## 2022-01-09 DIAGNOSIS — I429 Cardiomyopathy, unspecified: Secondary | ICD-10-CM | POA: Diagnosis not present

## 2022-01-09 DIAGNOSIS — I330 Acute and subacute infective endocarditis: Secondary | ICD-10-CM | POA: Diagnosis not present

## 2022-01-09 DIAGNOSIS — A419 Sepsis, unspecified organism: Secondary | ICD-10-CM | POA: Diagnosis not present

## 2022-01-09 DIAGNOSIS — R570 Cardiogenic shock: Secondary | ICD-10-CM | POA: Diagnosis not present

## 2022-01-09 DIAGNOSIS — N179 Acute kidney failure, unspecified: Secondary | ICD-10-CM | POA: Diagnosis not present

## 2022-01-09 DIAGNOSIS — I614 Nontraumatic intracerebral hemorrhage in cerebellum: Secondary | ICD-10-CM | POA: Diagnosis not present

## 2022-01-09 DIAGNOSIS — G9341 Metabolic encephalopathy: Secondary | ICD-10-CM | POA: Diagnosis not present

## 2022-01-09 DIAGNOSIS — R29818 Other symptoms and signs involving the nervous system: Secondary | ICD-10-CM | POA: Diagnosis not present

## 2022-01-09 DIAGNOSIS — T826XXA Infection and inflammatory reaction due to cardiac valve prosthesis, initial encounter: Principal | ICD-10-CM

## 2022-01-09 DIAGNOSIS — R531 Weakness: Secondary | ICD-10-CM | POA: Diagnosis not present

## 2022-01-09 DIAGNOSIS — Z7901 Long term (current) use of anticoagulants: Secondary | ICD-10-CM | POA: Diagnosis not present

## 2022-01-09 DIAGNOSIS — I5022 Chronic systolic (congestive) heart failure: Secondary | ICD-10-CM | POA: Diagnosis not present

## 2022-01-09 DIAGNOSIS — N170 Acute kidney failure with tubular necrosis: Secondary | ICD-10-CM | POA: Diagnosis not present

## 2022-01-09 DIAGNOSIS — R6521 Severe sepsis with septic shock: Secondary | ICD-10-CM | POA: Diagnosis not present

## 2022-01-09 DIAGNOSIS — Z952 Presence of prosthetic heart valve: Secondary | ICD-10-CM | POA: Diagnosis not present

## 2022-01-09 DIAGNOSIS — J9601 Acute respiratory failure with hypoxia: Secondary | ICD-10-CM | POA: Diagnosis not present

## 2022-01-09 DIAGNOSIS — I63411 Cerebral infarction due to embolism of right middle cerebral artery: Secondary | ICD-10-CM | POA: Diagnosis not present

## 2022-01-09 DIAGNOSIS — I6782 Cerebral ischemia: Secondary | ICD-10-CM | POA: Diagnosis not present

## 2022-01-09 DIAGNOSIS — I5023 Acute on chronic systolic (congestive) heart failure: Secondary | ICD-10-CM | POA: Diagnosis not present

## 2022-01-09 DIAGNOSIS — K7200 Acute and subacute hepatic failure without coma: Secondary | ICD-10-CM | POA: Diagnosis not present

## 2022-01-09 DIAGNOSIS — L88 Pyoderma gangrenosum: Secondary | ICD-10-CM | POA: Diagnosis not present

## 2022-01-09 DIAGNOSIS — K529 Noninfective gastroenteritis and colitis, unspecified: Secondary | ICD-10-CM | POA: Diagnosis not present

## 2022-01-09 DIAGNOSIS — Z20822 Contact with and (suspected) exposure to covid-19: Secondary | ICD-10-CM | POA: Diagnosis not present

## 2022-01-09 LAB — CBC
HCT: 31.3 % — ABNORMAL LOW (ref 39.0–52.0)
Hemoglobin: 10 g/dL — ABNORMAL LOW (ref 13.0–17.0)
MCH: 26.2 pg (ref 26.0–34.0)
MCHC: 31.9 g/dL (ref 30.0–36.0)
MCV: 82.2 fL (ref 80.0–100.0)
Platelets: 397 10*3/uL (ref 150–400)
RBC: 3.81 MIL/uL — ABNORMAL LOW (ref 4.22–5.81)
RDW: 18.2 % — ABNORMAL HIGH (ref 11.5–15.5)
WBC: 8 10*3/uL (ref 4.0–10.5)
nRBC: 0 % (ref 0.0–0.2)

## 2022-01-09 LAB — BASIC METABOLIC PANEL
Anion gap: 7 (ref 5–15)
BUN: 21 mg/dL — ABNORMAL HIGH (ref 6–20)
CO2: 23 mmol/L (ref 22–32)
Calcium: 9.1 mg/dL (ref 8.9–10.3)
Chloride: 103 mmol/L (ref 98–111)
Creatinine, Ser: 0.82 mg/dL (ref 0.61–1.24)
GFR, Estimated: >60 mL/min (ref >60)
Glucose, Bld: 83 mg/dL (ref 70–99)
Potassium: 4.5 mmol/L (ref 3.5–5.1)
Sodium: 133 mmol/L — ABNORMAL LOW (ref 135–145)

## 2022-01-09 LAB — PROTIME-INR
INR: 1.7 — ABNORMAL HIGH (ref 0.8–1.2)
Prothrombin Time: 20.1 seconds — ABNORMAL HIGH (ref 11.4–15.2)

## 2022-01-09 LAB — MAGNESIUM: Magnesium: 1.7 mg/dL (ref 1.7–2.4)

## 2022-01-09 LAB — DIGOXIN LEVEL: Digoxin Level: 0.5 ng/mL — ABNORMAL LOW (ref 0.8–2.0)

## 2022-01-09 MED ORDER — MAGNESIUM SULFATE 4 GM/100ML IV SOLN
4.0000 g | Freq: Once | INTRAVENOUS | Status: AC
  Administered 2022-01-09: 4 g via INTRAVENOUS
  Filled 2022-01-09: qty 100

## 2022-01-09 MED ORDER — PROCHLORPERAZINE EDISYLATE 10 MG/2ML IJ SOLN
5.0000 mg | Freq: Once | INTRAMUSCULAR | Status: AC
Start: 2022-01-09 — End: 2022-01-09
  Administered 2022-01-09: 5 mg via INTRAVENOUS
  Filled 2022-01-09: qty 2

## 2022-01-09 MED ORDER — POTASSIUM CHLORIDE CRYS ER 20 MEQ PO TBCR
40.0000 meq | EXTENDED_RELEASE_TABLET | Freq: Every day | ORAL | 1 refills | Status: DC | PRN
  Filled 2022-01-09: qty 60, 30d supply, fill #0

## 2022-01-09 MED ORDER — ASPIRIN 81 MG PO CHEW
81.0000 mg | CHEWABLE_TABLET | Freq: Every day | ORAL | Status: DC
  Administered 2022-01-09 – 2022-01-17 (×9): 81 mg via ORAL
  Filled 2022-01-09 (×9): qty 1

## 2022-01-09 MED ORDER — WARFARIN SODIUM 2 MG PO TABS
ORAL_TABLET | ORAL | 4 refills | Status: DC
  Filled 2022-01-09: qty 60, 28d supply, fill #0

## 2022-01-09 MED ORDER — OXYCODONE HCL 5 MG PO TABS
5.0000 mg | ORAL_TABLET | ORAL | Status: DC | PRN
  Administered 2022-01-09 – 2022-01-17 (×42): 5 mg via ORAL
  Filled 2022-01-09 (×42): qty 1

## 2022-01-09 MED ORDER — AMIODARONE HCL 200 MG PO TABS
200.0000 mg | ORAL_TABLET | Freq: Every day | ORAL | 1 refills | Status: DC
  Filled 2022-01-09: qty 30, 30d supply, fill #0

## 2022-01-09 MED ORDER — MIDODRINE HCL 5 MG PO TABS
2.5000 mg | ORAL_TABLET | Freq: Three times a day (TID) | ORAL | Status: DC
Start: 2022-01-09 — End: 2022-01-09

## 2022-01-09 MED ORDER — ASPIRIN 81 MG PO CHEW
81.0000 mg | CHEWABLE_TABLET | Freq: Every day | ORAL | 1 refills | Status: DC
  Filled 2022-01-09: qty 30, 30d supply, fill #0

## 2022-01-09 MED ORDER — DAPAGLIFLOZIN PROPANEDIOL 10 MG PO TABS
10.0000 mg | ORAL_TABLET | Freq: Every day | ORAL | 1 refills | Status: DC
  Filled 2022-01-09: qty 30, 30d supply, fill #0

## 2022-01-09 MED ORDER — MIDODRINE HCL 5 MG PO TABS
2.5000 mg | ORAL_TABLET | Freq: Three times a day (TID) | ORAL | Status: DC
  Administered 2022-01-09 – 2022-01-13 (×8): 2.5 mg via ORAL
  Filled 2022-01-09 (×9): qty 1

## 2022-01-09 MED ORDER — ENSURE ENLIVE PO LIQD: 237.0000 mL | Freq: Three times a day (TID) | ORAL | 12 refills | Status: DC

## 2022-01-09 MED ORDER — WARFARIN SODIUM 10 MG PO TABS
10.0000 mg | ORAL_TABLET | Freq: Once | ORAL | Status: AC
Start: 2022-01-09 — End: 2022-01-09
  Administered 2022-01-09: 10 mg via ORAL
  Filled 2022-01-09: qty 1

## 2022-01-09 MED ORDER — ACETAMINOPHEN 160 MG/5ML PO SOLN: 650.0000 mg | Freq: Four times a day (QID) | ORAL | Status: DC | PRN

## 2022-01-09 MED ORDER — MIDODRINE HCL 5 MG PO TABS
5.0000 mg | ORAL_TABLET | Freq: Three times a day (TID) | ORAL | 1 refills | Status: DC
Start: 2022-01-09 — End: 2022-01-09
  Filled 2022-01-09: qty 90, 30d supply, fill #0

## 2022-01-09 MED ORDER — MIDODRINE HCL 2.5 MG PO TABS
2.5000 mg | ORAL_TABLET | Freq: Three times a day (TID) | ORAL | 1 refills | Status: DC
  Filled 2022-01-09: qty 90, 30d supply, fill #0

## 2022-01-09 MED ORDER — DIGOXIN 125 MCG PO TABS
0.1250 mg | ORAL_TABLET | Freq: Every day | ORAL | 1 refills | Status: DC
  Filled 2022-01-09: qty 30, 30d supply, fill #0

## 2022-01-09 MED ORDER — ENOXAPARIN SODIUM 80 MG/0.8ML IJ SOSY
80.0000 mg | PREFILLED_SYRINGE | Freq: Two times a day (BID) | INTRAMUSCULAR | 0 refills | Status: DC
  Filled 2022-01-09: qty 11.2, 7d supply, fill #0

## 2022-01-09 MED ORDER — FUROSEMIDE 40 MG PO TABS
40.0000 mg | ORAL_TABLET | Freq: Every day | ORAL | 11 refills | Status: DC | PRN
  Filled 2022-01-09: qty 30, 30d supply, fill #0

## 2022-01-09 NOTE — Progress Notes (Signed)
     Daily Progress Note Intern Pager: [phone]  Patient name: Donald [name] Medical record number: [id number] Date of birth: 09-Oct-1985 Age: 36 y.o. Gender: male  Primary Care Provider: No primary care provider on file. Consultants: PCCM, ID, cardiology, pharmacy Code Status: Full   Pt Overview and Major Events to Date:  -7/14 admitted FPTS for sepsis, N/V. Started on flagyl, cefepime, vanc -7/15 worsening shock> ICU> intubated, pressors, abx. Flagyl stopped -7/16 decreasing milrinone for high CO and co-ox -7/19 extubated -7/21 drop in cardiac index, Levophed restarted, Swan discontinued -7/22 levophed stopped -7/23 milrinone stopped -7/24 out to FMTS   Assessment and Plan:   SAVIEN [name] is a 36 y.o. male who presented with nausea, vomiting, and diarrhea and found to have cardiogenic and septic shock with MV endocarditis. Pertinent PMH/PSH includes MR s/p MV mechanical valve on warfarin, biventricular HF, and PAF.   * Endocarditis of prosthetic mitral valve (HCC) Stable on abx. Mechanical valve present, on wafarin at home. Bcx on admission with no growth, though only 2 bottles obtained. -Continue vanc 750 mg Q8, cefepime 2 g Q8 until August 27th, 2023 through newly-placed PICC -Now on lovenox bridge until INR therapeutic 2.5-3.5 -Cardiology following, appreciate recs  Shock, septic and cardiogenic Resolved since out of ICU. On vanc, cefepime, midodrine. -Monitor VS -Continue abx per endocarditis  Paroxysmal atrial fibrillation (HCC) Stable. On amiodarone 200 mg daily. -Cardiology following, appreciate recs -Monitor rate/rhythm control -LFTs down trending, can continue med  Chronic heart failure (HCC) Stable. Echo with biventricular failure, EF this admission <20%. Weight downtrending over admission with lasix 40 mg PO. On digoxin 0.125 mg daily, farxiga 10 mg daily. -F/u cardiology recs, lasix administration, unsure of transplant candidacy -Continue  digoxin, farxiga -Monitor fluid overload  Microcytic anemia Chronic issues. Hgb at baseline.  -Monitor clinical status  FEN/GI: Thin diet, KVO NS fluids PPx: Heparin infusion Dispo:Home likely today with lovenox bridge, PICC abx  Subjective:  Doing well this morning without complaints.  Objective: Temp:  [97.7 F (36.5 C)-98.7 F (37.1 C)] 97.7 F (36.5 C) (07/27 0759) Pulse Rate:  [68-74] 69 (07/27 0759) Resp:  [12-20] 17 (07/27 0759) BP: (107-116)/(74-85) 113/83 (07/27 0759) SpO2:  [98 %-99 %] 98 % (07/27 0759) Physical Exam: General: Lying in bed, in NAD Cardiovascular: Regular rate Respiratory: Breathing and speaking comfortably on RA Abdomen: Nondistended Extremities: Moves all extremities grossly equally  Laboratory: Most recent CBC Lab Results  Component Value Date   WBC 8.0 01/09/2022   HGB 10.0 (L) 01/09/2022   HCT 31.3 (L) 01/09/2022   MCV 82.2 01/09/2022   PLT 397 01/09/2022   Most recent BMP    Latest Ref Rng & Units 01/09/2022    6:24 AM  BMP  Glucose 70 - 99 mg/dL 83   BUN 6 - 20 mg/dL 21   Creatinine 7.09 - 1.24 mg/dL 6.28   Sodium 366 - 294 mmol/L 133   Potassium 3.5 - 5.1 mmol/L 4.5   Chloride 98 - 111 mmol/L 103   CO2 22 - 32 mmol/L 23   Calcium 8.9 - 10.3 mg/dL 9.1    INR 1.7 Mg 1.7  Donald Holmes, MD 01/09/2022, 8:02 AM PGY-1, [facility] FPTS Intern pager: [phone], text pages welcome Secure chat group [facility] Teaching Service

## 2022-01-09 NOTE — Progress Notes (Signed)
Measured left upper arm at 42 cm and right upper arm at 35 cm.  Pt states that he "fell" while he was in CT department. I called CT and all the techs stated that he was monitored throughout time in department and did not note any fall. Per patient he did not fall so much and lay down on his arm heavily. Arm does appear to be larger still than prior to going to CT. Area is soft without hematoma noted. Area temperature is consistent with other extremities and no redness noted. Pt resting with call bell within reach.  Will continue to monitor. Thomas Hoff, RN

## 2022-01-09 NOTE — Progress Notes (Addendum)
Advanced Heart Failure Rounding Note   Subjective:   7/19 Extubated. Diuresed with IV lasix. I/O not accurate. Norepi weaned off.  7/20 Swan out  7/23 Milrinone off  ID following. On vanc/cefepime for pMVR endocarditis. BCx NG. Remains afebrile.  Has PICC for continuation of home IV abx.   SBP 100s-110s on midodrine 5 TID  Feeling well. No dyspnea, orthopnea or PND. Has been ambulating.  Objective:     Vital Signs:   Temp:  [97.7 F (36.5 C)-98.7 F (37.1 C)] 97.7 F (36.5 C) (07/27 0759) Pulse Rate:  [68-74] 69 (07/27 0759) Resp:  [17-20] 17 (07/27 0759) BP: (107-116)/(74-85) 113/83 (07/27 0759) SpO2:  [98 %-99 %] 98 % (07/27 0759) Last BM Date : 01/06/22  Weight change: Filed Weights   01/07/22 0500 01/07/22 2233 01/08/22 0500  Weight: 88.4 kg 89 kg 89 kg    Intake/Output:   Intake/Output Summary (Last 24 hours) at 01/09/2022 0853 Last data filed at 01/09/2022 0700 Gross per 24 hour  Intake 616.56 ml  Output 1900 ml  Net -1283.44 ml   PHYSICAL EXAM: General:  Well appearing.  HEENT: normal Neck: supple. no JVD. Carotids 2+ bilat; no bruits.  Cor: PMI nondisplaced. Regular rate & rhythm. No rubs, gallops or murmurs. Lungs: clear Abdomen: soft, nontender, nondistended.  Extremities: no cyanosis, clubbing, rash, edema, + RUE PICC Neuro: alert & orientedx3, cranial nerves grossly intact. moves all 4 extremities w/o difficulty. Affect pleasant    Telemetry: SR 70s (personally reviewed)   Labs: Basic Metabolic Panel: Recent Labs  Lab 01/05/22 0342 01/06/22 0409 01/07/22 0357 01/08/22 0525 01/09/22 0624  NA 137 134* 132* 132* 133*  K 4.6 4.3 4.2 4.2 4.5  CL 104 104 102 103 103  CO2 28 25 24 24 23   GLUCOSE 106* 86 109* 86 83  BUN 17 17 22* 18 21*  CREATININE 0.74 0.75 0.84 0.84 0.82  CALCIUM 8.5* 8.6* 8.7* 9.1 9.1  MG 1.6* 1.7 1.7 1.7 1.7  PHOS 3.3  --   --   --   --     Liver Function Tests: Recent Labs  Lab 01/03/22 0345 01/04/22 0323  01/06/22 0409 01/07/22 0357 01/08/22 0525  AST 1,352* 910* 407* 317* 240*  ALT 1,123* 826* 498* 375* 294*  ALKPHOS 122 143* 115 128* 129*  BILITOT 1.3* 1.0 1.0 1.0 1.0  PROT 6.9 7.3 7.6 8.4* 8.7*  ALBUMIN 2.0* 2.0* 2.2* 2.4* 2.5*   No results for input(s): "LIPASE", "AMYLASE" in the last 168 hours. No results for input(s): "AMMONIA" in the last 168 hours.   CBC: Recent Labs  Lab 01/05/22 0342 01/06/22 0409 01/07/22 0357 01/08/22 0525 01/09/22 0624  WBC 8.8 9.1 9.5 8.6 8.0  HGB 9.4* 8.9* 9.7* 9.9* 10.0*  HCT 29.3* 28.2* 30.4* 30.9* 31.3*  MCV 80.9 82.0 82.6 81.7 82.2  PLT 289 303 319 357 397    Cardiac Enzymes: No results for input(s): "CKTOTAL", "CKMB", "CKMBINDEX", "TROPONINI" in the last 168 hours.  BNP: BNP (last 3 results) Recent Labs    07/24/21 1433 10/11/21 1230 12/27/21 1451  BNP 3,184.1* 400.8* >4,500.0*    ProBNP (last 3 results) No results for input(s): "PROBNP" in the last 8760 hours.    Other results:  Imaging: No results found.   Medications:     Scheduled Medications:  amiodarone  200 mg Oral Daily   Chlorhexidine Gluconate Cloth  6 each Topical Daily   dapagliflozin propanediol  10 mg Oral Daily   digoxin  0.125 mg Oral Daily   enoxaparin (LOVENOX) injection  80 mg Subcutaneous Q12H   feeding supplement  237 mL Oral TID BM   midodrine  5 mg Oral TID WC   sodium chloride flush  10-40 mL Intracatheter Q12H   Warfarin - Pharmacist Dosing Inpatient   Does not apply q1600    Infusions:  sodium chloride 10 mL/hr at 01/06/22 0901   sodium chloride 10 mL/hr at 01/07/22 2200   ceFEPime (MAXIPIME) IV 2 g (01/09/22 0454)   vancomycin 750 mg (01/08/22 2349)    PRN Medications: sodium chloride, sodium chloride, acetaminophen (TYLENOL) oral liquid 160 mg/5 mL, ipratropium-albuterol, mouth rinse, sodium chloride flush   Assessment/Plan:   1. Mixed cardiogenic/septic shock - echo 12/28/21 LVEF 10% RV sev HK - TEE 7/15 severe  biventricular failure. + vegetation on mechanical MVR - Bcx - NGTD - Respiratory culture w/ moderate WBCs (mononuclear and PMN) and no organisms  - Cultures no growth.  - ID following. Continue vanc/cefepime. Now AF - Off inotropes/pressors. MAPs stable on midodrine 5 TID.  2. 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 Repeat echo as above.  - Echo this admit LVEF 10% RV severely HK.  - Off milrinone and NE. - Evuolemic on exam. Hold lasix again today. Will likely need PRN lasix at discharge. - Continue Farxiga 10 mg daily - Continue digoxin 0.125, dig level 0.5. - Decrease midodrine to 2.5 mg TID. If BP stable at follow-up can stop and further titrate GDMT. - Long-term only option is transplant. Dr. 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 They will discuss.    3. 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  - Underwent MVR with mechanical mitral valve in 05/2021 at [facility].  - TEE 7/15 severe biventricular failure. + vegetation on mechanical MVR - Bcx NGTD. -> on vanc/cefepime. 6 weeks IV abx, end date 08/24.  - Has PICC for home abx  - ID following. Appreciate assistance. - INR 1.7.  Continue warfarin. Bridging with Lovenox until INR therapeutic (goal 2.5-3.5). He is willing to continue Lovenox at home until INR at goal.   4. Acute hypoxic respiratory failure - intubated 7/15 in setting of shock - Extubated 719  - O2 stable on RA. Resolved.  5. Paroxysmal Atrial Fibrillation - In SR  - does not tolerate AF - liver enzymes continue to improve. - back on amio 200 daily  5. AKI - Due to ATN/shock - Baseline creatinine  0.98 - 1.0.  - Peak 2.6. Resolved.   6. Shock liver - continue supportive care - AST /ALT continue trend down.   7. Autoimmune Disorder  - He has seronegative spondylitis and pyoderma gangrenosum. Had been on Humira. - Followed by Rheumatology as an outpatient.   8. Hypomagnesemia/hypokalemia - Mag 1.7. Give 4 gm mag IV again today. - K 4.5  9. Deconditioning - PT/OT following. Improving. No longer needs CIR. - Continue to mobilize.   Okay for discharge from HF perspective once forms completed for home IV abx. Infusion rep completing.  INR 1.7 today. On warfarin, will review discharge recs with PharmD. Goal INR 2.5-3.5. Can bridge with lovenox 80 IV BID until  therapeutic. Has INR check 07/31.   HF medications at discharge: Aspirin 81 mg daily Amiodarone 200 mg daily Farxiga 10 mg daily Digoxin 0.125 mg daily Midodrine 2.5 mg TID Lasix 40 mg daily Potassium chloride 40 mEQ daily Do not resume losartan or spiro  IV Vancomycin and Cefepime per ID orders.  Has f/u in HF clinic scheduled.   Length of Stay: 12  FINCH, LINDSAY N, PA-C  8:53 AM   01/09/2022, 8:53 AM  Advanced Heart Failure Team Pager [phone] (M-F; 7a - 4p)  Please contact [facility] for night-coverage after hours (4p -7a ) and weekends on amion.  Patient seen and examined with the above-signed Advanced Practice Provider and/or Housestaff. I personally reviewed laboratory data, imaging studies and relevant notes. I independently examined the patient and formulated the important aspects of the plan. I have edited the note to reflect any of my changes or salient points. I have personally discussed the plan with the patient and/or family.  He looks good. Denies f/c/SOB/orthopnea or PND.   INR 1.7 on heparin/warfarin  Tolerating IV abx  General:  Sitting up No resp difficulty HEENT: normal Neck: supple. no JVD. Carotids 2+ bilat; no bruits. No lymphadenopathy or thryomegaly appreciated. Cor: PMI  nondisplaced. Regular rate & rhythm. No rubs, gallops or murmurs. Mech s1 Lungs: clear Abdomen: soft, nontender, nondistended. No hepatosplenomegaly. No bruits or masses. Good bowel sounds. Extremities: no cyanosis, clubbing, rash, edema + pyoderma lesions  Neuro: alert & orientedx3, cranial nerves grossly intact. moves all 4 extremities w/o difficulty. Affect pleasant  Ok for d/c on above meds. Will do lovenox bridge for mMVR. Continue IV abx. Will continue to discuss long-term plan re: advanced therapies with [facility].   Arvilla Meres, MD  11:45 AM

## 2022-01-09 NOTE — Progress Notes (Signed)
FMTS Interim Progress Note  S: Paged by RN regarding patient developing nausea and vomiting.  Went to bedside to find patient up in bed awake and alert, mom and an adult male at bedside.  Patient reports nausea and vomiting, only one episode of emesis.  Mom holds up emesis bag and reports maybe 1 inch of emesis in previous bag.  She also reports that the patient feels quite hot, he cannot get cool enough.  Room temperature is now set to 65 F.  Patient also reports that his entire arm has become much more numb and tingly and painful.  He also says he is having trouble moving it.  O: BP (!) 132/96 (BP Location: Left Wrist)   Pulse 69   Temp 97.7 F (36.5 C) (Oral)   Resp 20   Ht 5\' 10"  (1.778 m)   Wt 89 kg   SpO2 98%   BMI 28.15 kg/m   General: Awake, alert, mildly distressed Skin: Left forearm and hand warmer to touch than previous evaluation, right hand feels cool in comparison, left axillary nodule remains unchanged on palpation MSK: Unchanged range of motion, although limited by pain Neuro: Neurodeficits noted.  Left grip strength, push, and pull all 4/5 strength.  Right side 5/5 strength.  Patient reports globally decreased sensation over left hand, forearm, and upper arm in all locations tested.  A/P: Left arm weakness, numbness, tingling Given rapidly progressive numbness, tingling, pain and weakness, concern for logical deficit.  Patient is at increased risk of stroke despite anticoagulation due to current infectious endocarditis.  Will not call code stroke at this time, discussed with attending Dr. and we do not feel that stroke is likely the etiology given anticoagulation to call code stroke at this time.  We will continue to evaluate and adjust as clinically indicated. - Stat CT head without contrast to rule out brain bleed - We will likely obtain MRI brain  Prosthetic valve endocarditis Given new nausea and vomiting, there is a concern for impending deterioration.  Per  mom and chart review, this is how he presented to medical care to 3 times previously in which he required ICU care and intubation.  Notably, at the beginning of this current admission, he came in with nausea and vomiting, developed tachypnea overnight, and required ICU transfer and intubation the next morning. - Re-consult cardiology for perfusion concerns, suggestions from cardiac standpoint  Lum Babe, MD 01/09/2022, 5:20 PM PGY-3, [facility] Family Medicine Service pager [phone]

## 2022-01-09 NOTE — Progress Notes (Addendum)
Spoke with the current cards master Azalee Course, [place]) He has seen the patient and also spoken with Dr. Gala Romney.  He did not believe there is any low perfusion or cardiac etiology to the patient's current complaint.  They do note, however that patient is at high risk of embolic event given the infectious endocarditis.  Patient is currently undergoing Lovenox bridge to warfarin.   They agree with CT head and neurology consult.  I have paged the stroke APP at [phone] and am awaiting callback.   Fayette Pho, MD

## 2022-01-09 NOTE — Progress Notes (Signed)
ANTICOAGULATION CONSULT NOTE  Pharmacy Consult for heparin>>> enoxaparin>warfarin Indication:  MVR /AFib  No Known Allergies  Patient Measurements: Height: 5\' 10"  (177.8 cm) Weight: 89 kg (196 lb 3 oz) IBW/kg (Calculated) : 73 Heparin Dosing Weight: 90.7 kg  Vital Signs: Temp: 97.7 F (36.5 C) (07/27 0759) Temp Source: Oral (07/27 0759) BP: 113/83 (07/27 0759) Pulse Rate: 69 (07/27 0759)  Labs: Recent Labs    01/07/22 0357 01/08/22 0525 01/09/22 0624  HGB 9.7* 9.9* 10.0*  HCT 30.4* 30.9* 31.3*  PLT 319 357 397  LABPROT 16.4* 17.0* 20.1*  INR 1.3* 1.4* 1.7*  HEPARINUNFRC 0.48 0.55  --   CREATININE 0.84 0.84 0.82     Estimated Creatinine Clearance: 143.9 mL/min (by C-G formula based on SCr of 0.82 mg/dL).   Medical History: Past Medical History:  Diagnosis Date   Anemia    Autoimmune disorder (HCC)    pyoderma gangrenosum   CHF (congestive heart failure) (HCC)    Chronic systolic heart failure (HCC)    a. EF 35-40% by echo in 07/2018 b. EF at 45% by repeat echo in 04/2020   DVT (deep venous thrombosis) (HCC)    h/o   Dysrhythmia    Mitral regurgitation    a. 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 b. 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HCC)    arthritis   Tricuspid regurgitation    Assessment: 68 yom with a history of mitral regurgitation s/p mechanical valve replacement on Warfarin, HF, AF and prior DVT, and Hx of hidradenitis and pyoderma gangrenosum .   Patient taking warfarin PTA 4mg  daily per last AC note. INR 1.4 on 7/10 instructed to take 6mg  that night and then continue 4mg  daily.  Patient changed to full dose lovenox yesterday. INR trending up to 1.7 today, he has had much higher requirements during hospital stay.  Will give him another 10mg  today prior to discharge, 6mg  on 7/28 then have him resume 4mg  daily until seen on 7/31.      Picc placed for IV ABX at home    Goal of Therapy:  INR goal 2.5-3.5 Anti Xa level 0.6-1.2 drawn 4hr after dose  Monitor platelets by anticoagulation protocol: Yes   Plan:  Continue enoxaparin 80mg  q12h Warfarin 10mg  today Daily INR, and cbc  DC planning with enoxaparin 1mg /kg = 80mg  sq q12h bridge until INR at goal.   10mg  7/27, 6mg  on 7/28 then 4mg  on 7/29-7/30 Enoxaparin $4   PharmD., BCPS Clinical Pharmacist 01/09/2022 9:32 AM

## 2022-01-09 NOTE — Progress Notes (Signed)
Left UE venous duplex study completed. Please see CV Proc for preliminary results.  Zanita Millman BS, RVT 01/09/2022 4:07 PM

## 2022-01-09 NOTE — Progress Notes (Addendum)
Patient examined and case discussed with Dr. Gala Romney, main complaint was N/V and L arm weakness/numbness. Per patient, only one episode of N/V, therefore unlikely to be low cardiac perfusion. Dr. Gala Romney felt the patient was well perfused this morning.    Upper extremity doppler showed mostly anechoic mass in the shoulder area extending into the upper arm. Patient has h/o pyoderma gangrenosum. On physical exam, his left arm is clearly weaker then the right arm. Given endocarditis, Dr. Gala Romney is concerned of embolic event and recommended code stroke work up. Discussed with family medicine resident who has already ordered CT of head and consulted stroke PA. Patient went down for CT within 20 min after assessment, family medicine will follow up on the result. Timing of onset of weakness has been several hours prior to examination. No further cardiac recommendation at this time. Additional evaluation of arm numbness and weakness per family medicine.

## 2022-01-09 NOTE — Progress Notes (Addendum)
Called by resident to evaluate pt with focal neurological deficit. Pt NIHSS 0, with visible swelling and pain in Left upper arm. He is anticoagulated. He is LVO negative. CT head has been completed and is neg for hemorrhage. MRI has been ordered. No further action needed.   Verneda Skill RN, Stroke Response

## 2022-01-09 NOTE — Discharge Instructions (Addendum)
Dear Donald [name],  Thank you for letting us participate in your care. You were hospitalized for Endocarditis of prosthetic mitral valve (HCC).    POST-HOSPITAL & CARE INSTRUCTIONS STOP taking the spironolactone.  STOP taking the losartan.  START amiodarone 200 mg daily as prescribed (for irregular heart rhythm) START midodrine 2.5 mg three times daily as prescribed (for low blood pressure) START digoxin 0.125 mg daily as prescribed (for heart failure and irregular heart rhythm) START lasix 40 mg daily as needed. Your heart failure team will instruct you on when to take this medication.  START potassium 40 mEQ daily, only on days when you take the lasix.  START warfarin as directed by your heart failure team and coumadin clinic. Take Warfarin 6mg  on 7/28, Warfarin 4 mg on 7/29 and 7/30. CONTINUE farxiga as prescribed.  Go to your follow up appointments (listed below)  DOCTOR'S APPOINTMENT   Future Appointments  Date Time Provider Department Center  01/13/2022  3:30 PM [facility]  01/29/2022  3:30 PM [facility] PA/NP [facility] None  02/04/2022 10:30 AM [place], 02/06/2022, MD [facility]    Follow-up Information     [facility] Follow up on 01/29/2022.   Specialty: Cardiology Why: Advanced Heart Failure Clinic 3:30 pm Entrance C, Free Valet Parking Contact information: [address] [place] [phone]        [facility]. Go on 01/13/2022.   Specialty: Cardiology Why: Please arrive at 3:15 pm for your 3:30 pm apointment to have your INR checked for your Warfarin levels. Contact information: [address] [place] [phone]        [phone], MD. Gardiner Barefoot on 02/04/2022.   Specialty: Infectious Diseases Why: Please arrive at 10:15 am for your 10:30 am appointment. Contact information: [address] [place] [phone]               Take care and be well!  Family Medicine Teaching Service Inpatient Team [place]  [facility]  [address] [place], [place] [phone]

## 2022-01-09 NOTE — Progress Notes (Signed)
FMTS Brief Progress Note  S:Patient reports pain in upper medial arm. Pain is at rest and exacerbated with movement. He also feels weaker, says he cannot pick up items. Reports numbness without tingling. Says his pain is mainly in the bicep area.    O: BP 130/87 (BP Location: Left Wrist)   Pulse 74   Temp 97.8 F (36.6 C) (Oral)   Resp 20   Ht 5\' 10"  (1.778 m)   Wt 89 kg   SpO2 97%   BMI 28.15 kg/m   General: well appearing, laying in bed, conversant  Ext: L upper arm edematous, no color difference, no increased pain to palpation, passive movement normal. Sensation abnormal (more numb compared to touch on other arm). Strength 4/5. Deltoids normal strength.   A/P: L arm pain and weakness - MRI exam ended read pending  - Orders reviewed. Labs for AM ordered, which was adjusted as needed.   , MD 01/09/2022, 11:19 PM PGY-1, [facility] Family Medicine Night Resident  Please page [phone] with questions.

## 2022-01-09 NOTE — TOC Initial Note (Signed)
Transition of Care [facility]) - Initial/Assessment Note    Patient Details  Name: Donald [name] MRN: [id number] Date of Birth: [birth year]  Transition of [facility]) CM/SW Contact:    Elliot Cousin, RN Phone Number: [phone] 01/09/2022, 9:34 AM  Clinical Narrative:                  HF TOC CM spoke to [employer] rep, Pam RN and Berkley Harvey pending for IV abx. She will arrange [facility] with [facility]. Pt will receive meds from [facility] pharmacy. Mother will provide transportation home.     Expected Discharge Plan: Home w Home Health Services Barriers to Discharge: No Barriers Identified   Patient Goals and CMS Choice Patient states their goals for this hospitalization and ongoing recovery are:: wants him to get stronger CMS Medicare.gov Compare Post Acute Care list provided to:: Patient Represenative (must comment) (mother-Shirley) Choice offered to / list presented to : Parent  Expected Discharge Plan and Services Expected Discharge Plan: Home w Home Health Services   Discharge Planning Services: CM Consult Post Acute Care Choice: IP Rehab Living arrangements for the past 2 months: Single Family Home  HH Arranged: RN HH Agency: [employer] Date HH Agency Contacted: 01/09/22 Time HH Agency Contacted: [phone] Representative spoke with at [facility] Agency: Jeri Modena RN  Prior Living Arrangements/Services Living arrangements for the past 2 months: Single Family Home Lives with:: Parents   Do you feel safe going back to the place where you live?: Yes      Need for Family Participation in Patient Care: Yes (Comment) Care giver support system in place?: Yes (comment)   Criminal Activity/Legal Involvement Pertinent to Current Situation/Hospitalization: No - Comment as needed  Activities of Daily Living Home Assistive Devices/Equipment: None ADL Screening (condition at time of admission) Patient's cognitive ability adequate to safely complete daily activities?: Yes Is the patient deaf or  have difficulty hearing?: No Does the patient have difficulty seeing, even when wearing glasses/contacts?: No Does the patient have difficulty concentrating, remembering, or making decisions?: No Patient able to express need for assistance with ADLs?: No Does the patient have difficulty dressing or bathing?: No Independently performs ADLs?: Yes (appropriate for developmental age) Does the patient have difficulty walking or climbing stairs?: No Weakness of Legs: None Weakness of Arms/Hands: None  Permission Sought/Granted Permission sought to share information with : Case Manager, Family Supports, PCP Permission granted to share information with : Yes, Verbal Permission Granted  Share Information with NAME: Dalvin Clipper     Permission granted to share info w Relationship: mother  Permission granted to share info w Contact Information: [phone]  Emotional Assessment   Attitude/Demeanor/Rapport: Engaged Affect (typically observed): Accepting Orientation: : Oriented to Self, Oriented to Place, Oriented to  Time, Oriented to Situation   Psych Involvement: No (comment)  Admission diagnosis:  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] Dehydration [E86.0] Elevated troponin [R77.8] Acute febrile illness [R50.9] Generalized weakness [R53.1] Elevated lactic acid level [R79.89] Nausea vomiting and diarrhea [R11.2, R19.7] H/O mitral valve replacement [Z95.2] Anticoagulated on Coumadin [Z79.01] Sepsis (HCC) [A41.9] Cardiomyopathy, unspecified type (HCC) [I42.9] Patient Active Problem List   Diagnosis Date Noted   Cardiomyopathy [facility])    Paroxysmal atrial fibrillation (HCC) 01/06/2022   Endocarditis of prosthetic mitral valve (HCC)    Shock, septic and cardiogenic 12/28/2021   Acute febrile illness    Anticoagulated on Coumadin    Dehydration    Elevated lactic acid level    Nausea vomiting and diarrhea    Gastroenteritis  12/27/2021   Atrial fibrillation with RVR (HCC) 08/03/2021   Sacral  wound 08/03/2021   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   Shock liver 08/03/2021   Upper GI bleed 08/03/2021   Microcytic anemia 08/03/2021   Hyponatremia 08/03/2021   Cardiogenic shock (HCC)    Acute hypoxemic respiratory failure (HCC) 07/24/2021   Encounter for central line placement    H/O mitral valve replacement 06/20/2021   Encounter for therapeutic drug monitoring 06/20/2021   Chronic heart failure (HCC) 05/30/2021   Acute respiratory failure with hypoxia (HCC) 05/30/2021   Hypokalemia 05/30/2021   Elevated brain natriuretic peptide (BNP) level 05/30/2021   Elevated troponin 05/30/2021   Elevated serum creatinine 05/30/2021   Hypoalbuminemia due to protein-calorie malnutrition (HCC) 05/30/2021   Prolonged QT interval 05/30/2021   Transaminitis 05/30/2021   Paroxysmal atrial flutter (HCC) 05/30/2021   Acute systolic CHF (congestive heart failure) (HCC) 05/30/2021   Acute on chronic systolic and diastolic heart failure, NYHA class 3 (HCC) 05/30/2021   [place]    Chronic periodontitis    Retained dental root    Tricuspid regurgitation    Mitral regurgitation    Acute on chronic HFrEF (heart failure with reduced ejection fraction) (HCC)    Autoimmune disorder (HCC)    Seronegative spondylitis (HCC)    PCP:  No primary care provider on file. Pharmacy:   [facility] [address] [place] - [address] [place] [address] Phone: [phone] Fax: [phone]  [facility] [address] [place] [address] Phone: [phone] Fax: [phone]  [facility] [address] [place] [address] Phone: [phone] Fax: [phone]     Social Determinants of Health (SDOH) Interventions    Readmission Risk Interventions    01/06/2022    9:06 AM  Readmission Risk Prevention Plan  Transportation Screening Complete  Medication Review (RN Care Manager) Complete  SW Recovery Care/Counseling Consult  Complete

## 2022-01-09 NOTE — Discharge Summary (Deleted)
[facility] Discharge Summary  Patient name: Donald [name] Medical record number: [id number] Date of birth: 1986-05-13 Age: 36 y.o. Gender: male Date of Admission: 12/27/2021  Date of Discharge: 01/09/22 Admitting Physician: Martyn Malay, MD  Primary Care Provider: No primary care provider on file. Consultants: PCCM, ID, cards, pharmacy  Indication for Hospitalization: Septic and cardiogenic shock  Brief Hospital Course:  Donald [name] is a 36 y.o. 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 Pertinent medical history of mitral valve regurgitation and stenosis s/p repair by replacement, MI, atrial flutter, deep venous thrombosis, HFrEF, and pyoderma gangrenosum  Mixed septic and cardiogenic shock due to endocarditis Presented meeting SIRS criteria. No obvious source with negative CXR and UA. Vanc and Cefepime started, blood cultures were obtained. Patient subsequently developed tachypnea and new ST depressions and cardiology was consulted. Patient then had acute decompensation and was transferred to the ICU for shock. He was intubated with pressors and lasix drip, which was quickly transitioned to IV Lasix. TTE/TEE with severe biventricular failure and vegetation on mechanical MV. ID consulted and continued on antibiotics due to neg ultures (though done after abx initiation). Eventually extubated but still needed BP support given severe HF. Later, he was transferred to floor and remained stable until PICC line placement for abx until around 02/09/22.  Chronic Heart Failure Mitral Valve Replacement Presented with elevated BNP though exam and CXR without overload. Home meds initially held due to AKI and dehydration. After decompensation and ICU transfer per above, echo showed severe HF as above. Cardiology consulted, and patient was placed on  lasix. Home meds for GDMT were added back as tolerated until discharge where he was euvolemic and asymptomatic. He was also placed on ASA at discharge.  AKI due to sepsis Presented with elevated Cr to 1.4, which worsened initially with deteriorating clinical state. Nephrotoxic medications were held as able, and patient given fluids and supportive care until resolution. Creatinine upon discharge 0.82.  Paroxysmal Atrial Fibrillation On admission, patient's INR was subtherapeutic and patient was bridged with heparin. Home amiodarone was held due to development of shock liver. Patient was able to resume amiodarone once liver improved, which was continued on discharge. Patient was also able to transition to warfarin with lovenox bridge by discharge with follow up scheduled for INR checks.  Shock liver While in the ICU, patient's LFT's were significantly elevated secondary to septic/cardiogenic shock and severe heart failure. Liver enzymes improved with supportive care and patient was able to resume liver-metabolized medications.  Issues for Follow-Up Cardiology follow up, INR check ID follow up and abx x6 weeks BMP for Cr, lytes  Discharge Diagnoses/Problem List:  Principal Problem for Admission: Shock Other Problems addressed during stay:  Present on Admission:  Microcytic anemia  Disposition: Home with [place] for PICC abx  Discharge Condition: Stable  Discharge Exam:  Blood pressure 113/83, pulse 69, temperature 97.7 F (36.5 C), temperature source Oral, resp. rate 17, height '5\' 10"'  (1.778 m), weight 89 kg, SpO2 98 %.  GEN: Lying in bed, conversant, in NAD CAR: Regular rate RES: Breathing and speaking comfortably on RA ABD: Nondistended PSY: Euthymic mood and affect EXT: Moves all extremities grossly equally, PICC placed  Significant Procedures: Intubation, CVC, PICC line, feeding tube  Significant Labs and Imaging:  Recent Labs  Lab 01/08/22 0525 01/09/22 0624  WBC 8.6 8.0  HGB  9.9* 10.0*  HCT 30.9* 31.3*  PLT 357 397   Recent Labs  Lab 01/08/22 0525 01/09/22 0624  NA 132* 133*  K 4.2 4.5  CL 103 103  CO2 24 23  GLUCOSE 86 83  BUN 18 21*  CREATININE 0.84 0.82  CALCIUM 9.1 9.1  MG 1.7 1.7  ALKPHOS 129*  --   AST 240*  --   ALT 294*  --   ALBUMIN 2.5*  --    CXR 7/14 IMPRESSION: 1. Cardiomegaly without failure. 2. No acute cardiopulmonary disease.  CTAP 7/14 IMPRESSION: 1. No acute intra-abdominal or pelvic pathology. No bowel obstruction. Normal appendix. 2. Hepatomegaly. 3. Partially visualized induration of the skin and subcutaneous soft tissues adjacent to the intergluteal cleft and superficial to the coccyx. The no definite drainable fluid collection identified by CT. This can be better evaluated with ultrasound to assess for possible drainage.  CXR 7/15 IMPRESSION: Stable cardiomegaly. No acute cardiopulmonary abnormality.  Remainder of studies to confirm positioning of feeding tube, CVC, endotracheal tube  Results/Tests Pending at Time of Discharge: None  Discharge Medications:  Allergies as of 01/09/2022   No Known Allergies      Medication List     STOP taking these medications    doxycycline 100 MG capsule Commonly known as: VIBRAMYCIN   losartan 25 MG tablet Commonly known as: COZAAR   oxyCODONE-acetaminophen 5-325 MG tablet Commonly known as: PERCOCET/ROXICET   pantoprazole 40 MG tablet Commonly known as: PROTONIX   predniSONE 20 MG tablet Commonly known as: DELTASONE   spironolactone 25 MG tablet Commonly known as: ALDACTONE       TAKE these medications    acetaminophen 325 MG tablet Commonly known as: TYLENOL Take 2 tablets (650 mg total) by mouth every 4 (four) hours as needed for headache or mild pain.   amiodarone 200 MG tablet Commonly known as: PACERONE Take 1 tablet (200 mg total) by mouth daily. Start taking on: January 10, 2022 What changed: See the new instructions.   aspirin 81 MG  chewable tablet Chew 1 tablet (81 mg total) by mouth daily. Start taking on: January 10, 2022   ceFEPime  IVPB Commonly known as: MAXIPIME Inject 2 g into the vein every 8 (eight) hours. 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   digoxin 0.125 MG tablet Commonly known as: LANOXIN Take 1 tablet (0.125 mg total) by mouth daily. Start taking on: January 10, 2022   enoxaparin 80 MG/0.8ML injection Commonly known as: LOVENOX Inject 0.8 mLs (80 mg total) into the skin every 12 (twelve) hours. Adjust as directed by heart failure and warfarin clinic. To be used only until warfarin is at correct level.   Farxiga 10 MG Tabs tablet Generic drug: dapagliflozin propanediol Take 1 tablet (10 mg total) by mouth daily. Start taking on: January 10, 2022   feeding supplement Liqd Take 237 mLs by mouth 3 (three) times daily between meals.   furosemide 40 MG tablet Commonly known as: Lasix Take 1 tablet (40 mg total) by mouth daily as needed. As directed by heart failure clinic. Take potassium on days you take Lasix. What changed:  when to take  this reasons to take this additional instructions   midodrine 2.5 MG tablet Commonly known as: PROAMATINE Take 1 tablet (2.5 mg total) by mouth 3 (three) times daily with meals.   potassium chloride SA 20 MEQ tablet Commonly known as: KLOR-CON M Take 2 tablets (40 mEq total) by mouth daily as needed. Take potassium on days you take Lasix. What changed:  when to take this reasons to take this additional instructions   vancomycin  IVPB Inject 750 mg into the vein every 12 (twelve) hours.  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 Labs - Thursday:  BMP and vancomycin trough Labs - Every other week:  ESR and CRP Method of administration:Elastomeric Method of administration may be changed at the discretion of the patient and/or caregiver's ability to self-administer the medication ordered.   warfarin 2 MG tablet Commonly known as: COUMADIN Take as directed. If you are unsure how to take this medication, talk to your nurse or doctor. Original instructions: Take 6 mg (3 tablets) on 7/28 then 4 mg (2 tablets) daily until your coumadin clinic appointment on 7/31. What changed: additional instructions               Discharge Care Instructions  (From admission, onward)           Start     Ordered   01/08/22 0000  Change dressing on IV access line weekly and PRN  (Home infusion instructions - [facility] )        07" /26/23 1224            Discharge Instructions: Please refer to Patient Instructions section of EMR for full details.  Patient was counseled important signs and symptoms that should prompt return to medical care, changes in medications, dietary instructions, activity restrictions, and follow up appointments.   Follow-Up Appointments:  Follow-up Information     [facility] Follow up on 01/29/2022.   Specialty: Cardiology Why: [facility] 3:30 pm Entrance C, Free Valet Parking Contact information: [address] [id number] [place] [facility]. Go on 01/13/2022.   Specialty: Cardiology Why: Please arrive at 3:15 pm for your 3:30 pm apointment to have your INR checked for your Warfarin levels. Contact information: [place]        Thayer Headings, MD. Daphane Shepherd on 02/04/2022.   Specialty: Infectious Diseases Why: Please arrive at 10:15 am for your 10:30 am appointment. Contact information: [address] [place] [address] [phone]                 [name], MD 01/09/2022, 11:41 AM PGY-1, [place]

## 2022-01-09 NOTE — Progress Notes (Signed)
CARDIAC REHAB PHASE I   PRE:  Rate/Rhythm: 81 NSR  BP:  Sitting: 128/88      SaO2: 100 RA  MODE:  Ambulation: 470 ft   POST:  Rate/Rhythm: 96 NSR  BP:  Sitting: 141/86      SaO2: 100 RA   Seen pt from 1022-1102 pt was ambulated through hallway with standby assistance. Pt was returned to room w/o complaint. Pt was educated on HF book, daily weights, sodium reduction, light sodium and heart healthy diets,  ex guidelines and CRPII. Pt is being referred to CRPII at [facility].    [name]  11:01 AM 01/09/2022

## 2022-01-09 NOTE — Progress Notes (Addendum)
Paged [facility] Stroke APP at [phone] twice, once at 1715 and again at 1755 with no response.   CT head scan complete, images available.  Reviewed by myself personally.  Radiologist read indicates no acute stroke found.  No acute bleed.   Next called [phone] and spoke with the stroke response RN Consuella Lose.   Stat MRI brain without contrast ordered.  Fayette Pho, MD

## 2022-01-09 NOTE — Progress Notes (Signed)
FMTS Interim Progress Note  S:Paged by nurse regarding patient complaint of left axillary pain and swelling. He reports a knot.  Arrived to bedside to find patient awake and alert, mother at bedside.  He reports a left axillary knot that started bothering him this morning.  He reports some numbness and tingling in his arm and fingers.  No inciting event or trauma.  O: BP (!) 132/96 (BP Location: Left Wrist)   Pulse 69   Temp 97.7 F (36.5 C) (Oral)   Resp 20   Ht 5\' 10"  (1.778 m)   Wt 89 kg   SpO2 98%   BMI 28.15 kg/m   General: Awake, alert, oriented, no acute distress Skin: Scattered raised scaly nodules (c/w known dermatologic conditions), left sided palpable firm axillary nodule extending distally down bicep without overlying erythema or warmth, some tenderness to palpation MSK: Nonpainful passive range of motion of shoulder and elbow, patient has difficulty raising straightened arm over his head due to pain  A/P: New left arm nodule w/ numbness and tingling Unlikely thrombophlebitis or clot given current anticoagulation and antibiotic use.  Wonder if pyoderma gangrenosum contributing to nodule. - Vascular ultrasound of left arm and axilla  , MD 01/09/2022, 5:03 PM PGY-3, [facility] Service pager [phone]

## 2022-01-09 NOTE — Progress Notes (Signed)
Pt called stating he had swelling and pain under his left upper arm. Pt and mom states there is a knot.  Area in not hot to the touch. Pt states his whole arm is bothering him. MD made aware and will assess. Pt resting with call bell within reach.  Will continue to monitor. Thomas Hoff, RN

## 2022-01-09 NOTE — Progress Notes (Signed)
The resident alerted of this patient, who was initially to be discharged today, now having left UL swelling and pain. He mentioned swelling and pain started a few hours ago. He also endorses left UL weakness and loss of sensation. No known trauma.  On exam: No distress. Ext: Mild to moderate tenderness of his left deltoid, no erythema. I was unable to palpate a definite mass. Neuro: Mild loss of sensation of his left UL. Power of 5/5 of his RUL and 3/5 of his LUL. ++DTR B/L  A/P: Left UL pain, swelling, weakness and loss of sensation: New since this afternoon Differentials include DVT or CVA Left UL duplex U/S is neg for DVT but shows an undifferentiated incidental mass on the preliminary read. He is already on an anticoagulant. The resident is to reach out to radiology regarding the [place] report and information on further imaging if needed to differentiate this mass.  Obtain CT head. Consider neuro consult if positive CT head or worsening symptoms.  I communicated this to Dr. Larita Fife, who was with me at the time of his reexamination this afternoon.

## 2022-01-10 ENCOUNTER — Inpatient Hospital Stay (HOSPITAL_COMMUNITY): Payer: Medicaid Other

## 2022-01-10 DIAGNOSIS — I639 Cerebral infarction, unspecified: Secondary | ICD-10-CM | POA: Diagnosis not present

## 2022-01-10 DIAGNOSIS — L88 Pyoderma gangrenosum: Secondary | ICD-10-CM | POA: Diagnosis not present

## 2022-01-10 DIAGNOSIS — T826XXA Infection and inflammatory reaction due to cardiac valve prosthesis, initial encounter: Secondary | ICD-10-CM | POA: Diagnosis not present

## 2022-01-10 DIAGNOSIS — Z8673 Personal history of transient ischemic attack (TIA), and cerebral infarction without residual deficits: Secondary | ICD-10-CM | POA: Diagnosis not present

## 2022-01-10 DIAGNOSIS — I38 Endocarditis, valve unspecified: Secondary | ICD-10-CM | POA: Diagnosis not present

## 2022-01-10 DIAGNOSIS — M79602 Pain in left arm: Secondary | ICD-10-CM

## 2022-01-10 DIAGNOSIS — I5043 Acute on chronic combined systolic (congestive) and diastolic (congestive) heart failure: Secondary | ICD-10-CM | POA: Diagnosis not present

## 2022-01-10 DIAGNOSIS — I771 Stricture of artery: Secondary | ICD-10-CM | POA: Diagnosis not present

## 2022-01-10 DIAGNOSIS — R2232 Localized swelling, mass and lump, left upper limb: Secondary | ICD-10-CM | POA: Diagnosis not present

## 2022-01-10 DIAGNOSIS — I6389 Other cerebral infarction: Secondary | ICD-10-CM | POA: Diagnosis not present

## 2022-01-10 DIAGNOSIS — N179 Acute kidney failure, unspecified: Secondary | ICD-10-CM | POA: Diagnosis not present

## 2022-01-10 DIAGNOSIS — R600 Localized edema: Secondary | ICD-10-CM | POA: Diagnosis not present

## 2022-01-10 DIAGNOSIS — L02412 Cutaneous abscess of left axilla: Secondary | ICD-10-CM | POA: Diagnosis not present

## 2022-01-10 DIAGNOSIS — I480 Paroxysmal atrial fibrillation: Secondary | ICD-10-CM | POA: Diagnosis not present

## 2022-01-10 DIAGNOSIS — I5089 Other heart failure: Secondary | ICD-10-CM | POA: Diagnosis not present

## 2022-01-10 DIAGNOSIS — Z7901 Long term (current) use of anticoagulants: Secondary | ICD-10-CM | POA: Diagnosis not present

## 2022-01-10 LAB — LIPID PANEL
Cholesterol: 217 mg/dL — ABNORMAL HIGH (ref 0–200)
HDL: 41 mg/dL (ref >40)
LDL Cholesterol: 163 mg/dL — ABNORMAL HIGH (ref 0–99)
Total CHOL/HDL Ratio: 5.3 RATIO
Triglycerides: 63 mg/dL (ref <150)
VLDL: 13 mg/dL (ref 0–40)

## 2022-01-10 LAB — MAGNESIUM: Magnesium: 1.6 mg/dL — ABNORMAL LOW (ref 1.7–2.4)

## 2022-01-10 LAB — CBC
HCT: 31.4 % — ABNORMAL LOW (ref 39.0–52.0)
Hemoglobin: 10.2 g/dL — ABNORMAL LOW (ref 13.0–17.0)
MCH: 26.4 pg (ref 26.0–34.0)
MCHC: 32.5 g/dL (ref 30.0–36.0)
MCV: 81.1 fL (ref 80.0–100.0)
Platelets: 380 10*3/uL (ref 150–400)
RBC: 3.87 MIL/uL — ABNORMAL LOW (ref 4.22–5.81)
RDW: 18.1 % — ABNORMAL HIGH (ref 11.5–15.5)
WBC: 7.5 10*3/uL (ref 4.0–10.5)
nRBC: 0 % (ref 0.0–0.2)

## 2022-01-10 LAB — RAPID URINE DRUG SCREEN, HOSP PERFORMED
Amphetamines: NOT DETECTED
Barbiturates: NOT DETECTED
Benzodiazepines: NOT DETECTED
Cocaine: NOT DETECTED
Opiates: NOT DETECTED
Tetrahydrocannabinol: NOT DETECTED

## 2022-01-10 LAB — TSH: TSH: 3.866 u[IU]/mL (ref 0.350–4.500)

## 2022-01-10 LAB — BASIC METABOLIC PANEL
Anion gap: 6 (ref 5–15)
BUN: 13 mg/dL (ref 6–20)
CO2: 22 mmol/L (ref 22–32)
Calcium: 9 mg/dL (ref 8.9–10.3)
Chloride: 102 mmol/L (ref 98–111)
Creatinine, Ser: 0.77 mg/dL (ref 0.61–1.24)
GFR, Estimated: >60 mL/min (ref >60)
Glucose, Bld: 120 mg/dL — ABNORMAL HIGH (ref 70–99)
Potassium: 3.9 mmol/L (ref 3.5–5.1)
Sodium: 130 mmol/L — ABNORMAL LOW (ref 135–145)

## 2022-01-10 LAB — PROTIME-INR
INR: 2 — ABNORMAL HIGH (ref 0.8–1.2)
Prothrombin Time: 22.5 seconds — ABNORMAL HIGH (ref 11.4–15.2)

## 2022-01-10 LAB — HEMOGLOBIN A1C
Hgb A1c MFr Bld: 5.6 % (ref 4.8–5.6)
Mean Plasma Glucose: 114.02 mg/dL

## 2022-01-10 LAB — CK: Total CK: 180 U/L (ref 49–397)

## 2022-01-10 MED ORDER — ROSUVASTATIN CALCIUM 20 MG PO TABS
40.0000 mg | ORAL_TABLET | Freq: Every day | ORAL | Status: DC
  Administered 2022-01-11 – 2022-01-17 (×7): 40 mg via ORAL
  Filled 2022-01-10 (×7): qty 2

## 2022-01-10 MED ORDER — ROSUVASTATIN CALCIUM 20 MG PO TABS
20.0000 mg | ORAL_TABLET | Freq: Every day | ORAL | Status: DC
  Administered 2022-01-10: 20 mg via ORAL
  Filled 2022-01-10: qty 1

## 2022-01-10 MED ORDER — MAGNESIUM SULFATE IN D5W 1-5 GM/100ML-% IV SOLN
1.0000 g | Freq: Once | INTRAVENOUS | Status: AC
  Administered 2022-01-10: 1 g via INTRAVENOUS
  Filled 2022-01-10: qty 100

## 2022-01-10 MED ORDER — STROKE: EARLY STAGES OF RECOVERY BOOK
Freq: Once | Status: AC
  Filled 2022-01-10: qty 1

## 2022-01-10 MED ORDER — IOHEXOL 350 MG/ML SOLN
75.0000 mL | Freq: Once | INTRAVENOUS | Status: AC | PRN
  Administered 2022-01-10: 75 mL via INTRAVENOUS

## 2022-01-10 MED ORDER — WARFARIN SODIUM 5 MG PO TABS
6.0000 mg | ORAL_TABLET | Freq: Once | ORAL | Status: AC
  Administered 2022-01-10: 6 mg via ORAL
  Filled 2022-01-10: qty 1

## 2022-01-10 MED ORDER — ONDANSETRON 4 MG PO TBDP
4.0000 mg | ORAL_TABLET | Freq: Once | ORAL | Status: AC
  Administered 2022-01-10: 4 mg via ORAL
  Filled 2022-01-10: qty 1

## 2022-01-10 NOTE — Progress Notes (Signed)
Pharmacy Antibiotic Note  Donald [name] is a 36 y.o. male for which pharmacy has been consulted for cefepime and vancomycin dosing for prosthetic valve endocarditis.   Patient with a history of mitral regurgitation s/p valve replacement on Coumadin, HF, AF and prior DVT, and Hx of hidradenitis and pyoderma gangrenosum . Found to have a vegetation on mechanical mitral valve. Blood cultures are negative on 7/14 and 7/16. Scr stable at 0.77.   Last vancomycin levels done 7/25 resulted in a peak of 37 and a trough of 20 for a supratherapeutic AUC of 708.   Plan: Reduce Vancomycin to 750 mg every 12 hours Predicted AUC 471  Consider rechecking levels over weekend if patient is to stay admitted and update OPAT accordingly Continue cefepime 2 gm IV Q 8 hours  F/u renal function and clinical course  Height: 5\' 10"  (177.8 cm) Weight: 87 kg (191 lb 12.8 oz) IBW/kg (Calculated) : 73  Temp (24hrs), Avg:97.8 F (36.6 C), Min:97.7 F (36.5 C), Max:97.9 F (36.6 C)  Recent Labs  Lab 01/06/22 0409 01/07/22 0357 01/07/22 0955 01/08/22 0525 01/09/22 0624 01/10/22 0505  WBC 9.1 9.5  --  8.6 8.0 7.5  CREATININE 0.75 0.84  --  0.84 0.82 0.77  VANCOTROUGH  --   --  20  --   --   --   VANCOPEAK  --  37  --   --   --   --      Estimated Creatinine Clearance: 135.6 mL/min (by C-G formula based on SCr of 0.77 mg/dL).    No Known Allergies  Antimicrobials this admission: cefepime 7/14 >> (8/24) vancomycin 7/14 >> (8/24) metronidazole 7/14 >> 7/14  Microbiology results: *FYI it looks like only 1 cx on 7/14 prior to abx - several orders "cancelled" 7/15 BCx x 2 > ngf 7/14 BCx  ngf 7/16 Resp Cx > ngf   Thank you for allowing pharmacy to be a part of this patient's care.  8/16 PharmD., BCPS Clinical Pharmacist 01/10/2022 12:39 PM

## 2022-01-10 NOTE — Progress Notes (Signed)
Physical Therapy Treatment Patient Details Name: Donald [name] MRN: [id number] DOB: Jun 07, 1986 Today's Date: 01/10/2022   History of Present Illness Pt is a 36 y.o. 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 Transfer to ICU 7/15 with cardiogenic and septic shock; ETT 7/15-7/19. 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    PT Comments    Pt received supine and agreeable to session. Pt continues to demonstrate steady ambulation without AD, however pt guarding LUE this session and with increased complaints of fatigue. Session limited to pt stated tolerance and further ambulation and balance challenges deferred this session secondary to pt stated fatigue. Pt with noted edema of LUE, pain in PROM, and inability for AROM, able to grip, elevated LUE on 5x blankets and pillow for edema management at end of session, updated OTA following to follow up with pt for further positioing/ROM of LUE. Pt continues to benefit from skilled PT services to progress toward functional mobility goals.    Recommendations for follow up therapy are one component of a multi-disciplinary discharge planning process, led by the attending physician.  Recommendations may be updated based on patient status, additional functional criteria and insurance authorization.  Follow Up Recommendations  No PT follow up     Assistance Recommended at Discharge Intermittent Supervision/Assistance  Patient can return home with the following Assistance with cooking/housework;Direct supervision/assist for medications management;Direct supervision/assist for financial management;Assist for transportation   Equipment Recommendations  None  recommended by PT    Recommendations for Other Services       Precautions / Restrictions Precautions Precautions: Fall Precaution Comments: sacral wounds Restrictions Weight Bearing Restrictions: No     Mobility  Bed Mobility Overal bed mobility: Modified Independent Bed Mobility: Supine to Sit, Sit to Supine           General bed mobility comments: supine<>EOB without physical assist    Transfers Overall transfer level: Needs assistance Equipment used: None Transfers: Sit to/from Stand Sit to Stand: Supervision           General transfer comment: suerpvision for safety    Ambulation/Gait Ambulation/Gait assistance: Supervision Gait Distance (Feet): 500 Feet Assistive device: None Gait Pattern/deviations: Step-through pattern, Decreased stride length       General Gait Details: slow, steady gait without DME, no overt instability or LOB, guarding LUE throughout   Stairs             Wheelchair Mobility    Modified Rankin (Stroke Patients Only)       Balance Overall balance assessment: Needs assistance Sitting-balance support: No upper extremity supported Sitting balance-Leahy Scale: Good     Standing balance support: No upper extremity supported, During functional activity Standing balance-Leahy Scale: Good Standing balance comment: able to static stand without UE support                            Cognition Arousal/Alertness: Awake/alert Behavior During Therapy: WFL for tasks assessed/performed, Flat affect Overall Cognitive Status: Within Functional Limits for tasks assessed Area of Impairment: Attention, Awareness                   Current Attention Level: Selective Memory: Decreased short-term memory Following Commands: Follows one step commands consistently  Safety/Judgement: Decreased awareness of safety, Decreased awareness of deficits Awareness: Emergent Problem Solving: Slow processing, Requires verbal  cues General Comments: able to follow directions with increased safety        Exercises Other Exercises Other Exercises: PROM LUE within tolerance    General Comments General comments (skin integrity, edema, etc.): VSS on RA, pt with pain in PROM of LUE and inability for AROM, able to grip, elevated LUE on 5x blankets and pillow for edema management at end of session, updated following OTA to follow up with pt for further positioing/ROM of LUE.      Pertinent Vitals/Pain Pain Assessment Pain Assessment: Faces Faces Pain Scale: Hurts little more Pain Location: LUE with mobility Pain Descriptors / Indicators: Grimacing, Guarding Pain Intervention(s): Monitored during session, Limited activity within patient's tolerance, Repositioned    Home Living                          Prior Function            PT Goals (current goals can now be found in the care plan section) Acute Rehab PT Goals PT Goal Formulation: With family Time For Goal Achievement: 01/16/22    Frequency    Min 2X/week      PT Plan      Co-evaluation              AM-PAC PT "6 Clicks" Mobility   Outcome Measure  Help needed turning from your back to your side while in a flat bed without using bedrails?: None Help needed moving from lying on your back to sitting on the side of a flat bed without using bedrails?: None Help needed moving to and from a bed to a chair (including a wheelchair)?: A Little Help needed standing up from a chair using your arms (e.g., wheelchair or bedside chair)?: A Little Help needed to walk in hospital room?: A Little Help needed climbing 3-5 steps with a railing? : A Little 6 Click Score: 20    End of Session   Activity Tolerance: Patient tolerated treatment well Patient left: with call bell/phone within reach;with family/visitor present;in bed Nurse Communication: Mobility status PT Visit Diagnosis: Other abnormalities of gait and mobility  (R26.89);Difficulty in walking, not elsewhere classified (R26.2);Muscle weakness (generalized) (M62.81)     Time: 3419-6222 PT Time Calculation (min) (ACUTE ONLY): 21 min  Charges:  $Therapeutic Activity: 8-22 mins                     Mariella Blackwelder R. PTA Acute Rehabilitation Services Office: [phone]    Catalina Antigua 01/10/2022, 10:55 AM

## 2022-01-10 NOTE — Assessment & Plan Note (Addendum)
Stable.  CTA head/neck negative for aneurysm, so stroke team recommended to continue current antithrombotics. - continue rosuvastatin 40 mg daily -Stroke team signed off, plan to follow-up with stroke clinic NP in [facility] in about 4 weeks.

## 2022-01-10 NOTE — Consult Note (Signed)
NEURO HOSPITALIST CONSULT NOTE   Requestig physician: Dr. Gwendlyn Deutscher  Reason for Consult: Acute right MCA ischemic strokes on MRI  History obtained from:  Patient and Chart     HPI:                                                                                                                                          Donald [name] is an 36 y.o. male with a PMHx of anemia, pyoderma gangrenosum, CHF, DVT, dysrhythmia, mitral regurgitation, history of mitral valve replacement, MI, paroxysmal atrial fibrillation/flutter, arthritis and tricuspid regurgitation who presented to the hospital on 7/14 with vomiting and diarrhea. He was diagnosed with gastroenteritis. Shortly after admission he became progressively more tachypneic and then developed shock, likely cardiogenic.  He was transferred to the ICU. 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 On Thursday he developed left arm pain and weakness, for which an MRI brain was obtained, revealing several small acute right MCA territory ischemic strokes, suggestive of a cardioembolic etiology.    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 EF 35-40% by echo in 07/2018 b. EF at 45% by repeat echo in 04/2020   DVT (deep venous thrombosis) ([facility])    h/o   Dysrhythmia    Mitral regurgitation    a. 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 b. 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[facility])    arthritis   Tricuspid regurgitation     Past Surgical History:  Procedure Laterality Date   CARDIAC CATHETERIZATION     HERNIA REPAIR Right 2018    MITRAL VALVE REPAIR  06/07/2018   [place] - Dr Virgina Organ   MITRAL VALVE REPLACEMENT N/A 06/23/2021   MULTIPLE EXTRACTIONS WITH ALVEOLOPLASTY N/A 11/08/2020   Procedure: EXTRACTION OF TEETH NUMBER ONE, FIFTHTEEN, SIXTEEN, SEVENTEEN, EIGHTTEEN AND THRTY WITH ALVEOLOPLASTY OF LOWER LEFT QUADRANT.;  Surgeon: Charlaine Dalton, DMD;  Location: [facility];  Service: Dentistry;  Laterality: N/A;   RIGHT/LEFT HEART CATH AND CORONARY ANGIOGRAPHY N/A 09/11/2020   Procedure: RIGHT/LEFT HEART CATH AND CORONARY ANGIOGRAPHY;  Surgeon: Jolaine Artist, MD;  Location: [facility];  Service: Cardiovascular;  Laterality: N/A;   TEE WITHOUT CARDIOVERSION N/A 05/23/2020   Procedure: TRANSESOPHAGEAL ECHOCARDIOGRAM (TEE) WITH PROPOFOL;  Surgeon: Arnoldo Lenis, MD;  [facility];  Service: Endoscopy;  Laterality: N/A;    Family History  Problem Relation Age of Onset   Multiple sclerosis Mother    Psoriasis Mother    Depression Father    Diabetes Father    Diabetes Paternal Grandmother              Social History:  reports that he quit smoking about 14 months ago. His smoking use included cigarettes. He has never used smokeless tobacco. He reports that he does not drink alcohol and does not use drugs.  No Known Allergies  MEDICATIONS:                                                                                                                     Scheduled:  amiodarone  200 mg Oral Daily   aspirin  81 mg Oral Daily   Chlorhexidine Gluconate Cloth  6 each Topical Daily   dapagliflozin propanediol  10 mg Oral Daily   digoxin  0.125 mg Oral Daily   enoxaparin (LOVENOX) injection  80 mg Subcutaneous Q12H   feeding supplement  237 mL Oral TID BM   midodrine  2.5 mg Oral TID WC   sodium chloride flush  10-40 mL Intracatheter Q12H   Warfarin - Pharmacist Dosing Inpatient   Does not apply q1600   Continuous:  sodium chloride 10 mL/hr at 01/06/22 0901   sodium chloride  10 mL/hr at 01/07/22 2200   ceFEPime (MAXIPIME) IV 2 g (01/10/22 0505)   magnesium sulfate bolus IVPB 1 g (01/10/22 0648)   vancomycin 750 mg (01/09/22 2316)     ROS:                                                                                                                                       As per HPI. Does not endorse any additional complaints at the time of neurology evaluation.    Blood pressure 125/83, pulse 76, temperature 97.9 F (36.6 C), temperature source Oral, resp. rate 20, height 5\' 10"  (1.778 m), weight 89 kg, SpO2 96 %.   General Examination:  Physical Exam  HEENT-  [place]/AT Lungs- Respirations unlabored Extremities- No edema. Chronic skin lesions noted.    Neurological Examination Mental Status: Alert, oriented x 5, thought content appropriate. Speech fluent without evidence of aphasia.  Able to follow all commands without difficulty. Cranial Nerves: II: Temporal visual fields intact with no extinction to DSS. Right pupil 3 mm, round and reactive. Left pupil 4 mm round and reactive.   III,IV, VI: No ptosis. EOMI. No nystagmus.  V: Temp sensation decreased on the left.   VII: Smile symmetric VIII: Hearing intact to voice IX,X: No hoarseness XI: Symmetric shoulder shrug XII: Midline tongue extension Motor: RUE and RLE 5/5 LUE 2-3/5 proximally and distally.  LLE 5/5  Sensory: Decreased temp and FT sensation to LUE. Normal temp and FT sensation to LLE, RUE and RLE.  Deep Tendon Reflexes: 3+ and symmetric brachioradialis and patellae bilaterally Cerebellar: No ataxia with FNF on the right. Unable to assess on the left due to weakness.   Gait: Deferred   Lab Results: Basic Metabolic Panel: Recent Labs  Lab 01/05/22 0342 01/06/22 0409 01/07/22 0357 01/08/22 0525 01/09/22 0624  NA 137 134* 132* 132* 133*  K 4.6 4.3 4.2 4.2 4.5  CL 104 104 102 103 103   CO2 28 25 24 24 23   GLUCOSE 106* 86 109* 86 83  BUN 17 17 22* 18 21*  CREATININE 0.74 0.75 0.84 0.84 0.82  CALCIUM 8.5* 8.6* 8.7* 9.1 9.1  MG 1.6* 1.7 1.7 1.7 1.7  PHOS 3.3  --   --   --   --     CBC: Recent Labs  Lab 01/05/22 0342 01/06/22 0409 01/07/22 0357 01/08/22 0525 01/09/22 0624  WBC 8.8 9.1 9.5 8.6 8.0  HGB 9.4* 8.9* 9.7* 9.9* 10.0*  HCT 29.3* 28.2* 30.4* 30.9* 31.3*  MCV 80.9 82.0 82.6 81.7 82.2  PLT 289 303 319 357 397    Cardiac Enzymes: No results for input(s): "CKTOTAL", "CKMB", "CKMBINDEX", "TROPONINI" in the last 168 hours.  Lipid Panel: No results for input(s): "CHOL", "TRIG", "HDL", "CHOLHDL", "VLDL", "LDLCALC" in the last 168 hours.  Imaging: MR BRAIN WO CONTRAST  Result Date: 01/09/2022 CLINICAL DATA:  Initial evaluation for neuro deficit, stroke suspected. EXAM: MRI HEAD WITHOUT CONTRAST TECHNIQUE: Multiplanar, multiecho pulse sequences of the brain and surrounding structures were obtained without intravenous contrast. COMPARISON:  Head CT from earlier the same day. FINDINGS: Brain: Cerebral volume within normal limits. Mild hazy and patchy T2/FLAIR signal intensity seen involving the periventricular and deep white matter both cerebral hemispheres, nonspecific, but most characteristic of chronic small vessel ischemic disease, advanced for age. Ischemia. Small remote right cerebellar infarct noted. Patchy small volume foci of restricted diffusion seen involving the cortical subcortical right frontoparietal region (series 15, images 85, 84, 81), consistent with small acute ischemic infarcts. These are likely embolic in nature. No associated hemorrhage or mass effect. No other evidence for acute or subacute ischemia. No acute intracranial hemorrhage. Multiple scattered chronic micro hemorrhages noted involving the cerebellum and both cerebral hemispheres, nonspecific, but favored to be hypertensive in nature. No mass lesion, midline shift or mass effect. No  hydrocephalus or extra-axial fluid collection. Pituitary gland and suprasellar region within normal limits. Vascular: Major intracranial vascular flow voids are maintained. Skull and upper cervical spine: Craniocervical junction within normal limits. Bone marrow signal intensity diffusely decreased on T1 weighted sequence, nonspecific, but most commonly related to anemia, smoking or obesity. No focal marrow replacing lesion. Few small cystic lesions measuring up  to 1.8 cm seen within the visualized left temporal and pre-auricular region, nonspecific, but likely sebaceous cyst (series 27, images 10, 6). Sinuses/Orbits: Globes and orbital soft tissues within normal limits. Paranasal sinuses are largely clear. Small right mastoid effusion, of doubtful significance. Other: None. IMPRESSION: 1. Patchy small volume acute ischemic nonhemorrhagic cortical infarcts involving the subcortical right frontoparietal region. These are likely embolic in nature. 2. Underlying mild chronic microvascular ischemic disease, advanced for age. Small remote right cerebellar infarct. 3. Multiple scattered chronic micro hemorrhages involving the cerebellum and both cerebral hemispheres, nonspecific, but favored to be secondary to poorly controlled hypertension. Electronically Signed   By: Jeannine Boga M.D.   On: 01/09/2022 23:42   CT HEAD WO CONTRAST (5MM)  Result Date: 01/09/2022 CLINICAL DATA:  Neuro deficit, acute stroke suspected. Generalized weakness. EXAM: CT HEAD WITHOUT CONTRAST TECHNIQUE: Contiguous axial images were obtained from the base of the skull through the vertex without intravenous contrast. RADIATION DOSE REDUCTION: This exam was performed according to the departmental dose-optimization program which includes automated exposure control, adjustment of the mA and/or kV according to patient size and/or use of iterative reconstruction technique. COMPARISON:  CT head 07/24/2021 FINDINGS: Brain: There is no evidence  of acute intracranial hemorrhage, mass lesion, brain edema or extra-axial fluid collection. The ventricles and subarachnoid spaces are appropriately sized for age. There is no CT evidence of acute cortical infarction. Vascular:  No hyperdense vessel identified. Skull: Negative for fracture or focal lesion. Sinuses/Orbits: The visualized paranasal sinuses and mastoid air cells are clear. No orbital abnormalities are seen. Other: Small ossific density in the right supraorbital scalp (image 39/4) is unchanged. Probable sebaceous cyst anterior to the left ear (image 5/3), unchanged. IMPRESSION: Stable head CT without acute intracranial findings. No CT evidence of acute stroke. Electronically Signed   By: Richardean Sale M.D.   On: 01/09/2022 17:55   VAS [place] UPPER EXTREMITY VENOUS DUPLEX  Result Date: 01/09/2022 UPPER VENOUS STUDY  Patient Name:  Donald [name]  Date of Exam:   01/09/2022 Medical Rec #: [id number]            Accession #:    [id number] Date of Birth: February 28, 1986           Patient Gender: M Patient Age:   39 years Exam Location:  [facility] Procedure:      VAS [place] UPPER EXTREMITY VENOUS DUPLEX Referring Phys: Andrena Mews --------------------------------------------------------------------------------  Indications: Pain, and Swelling Limitations: Patient movement. Comparison Study: No previous exam noted. Performing Technologist: Bobetta Lime BS, RVT  Examination Guidelines: A complete evaluation includes B-mode imaging, spectral Doppler, color Doppler, and power Doppler as needed of all accessible portions of each vessel. Bilateral testing is considered an integral part of a complete examination. Limited examinations for reoccurring indications may be performed as noted.  Right Findings: +----------+------------+---------+-----------+----------+-------+ RIGHT     CompressiblePhasicitySpontaneousPropertiesSummary +----------+------------+---------+-----------+----------+-------+  Subclavian    Full       Yes       Yes                      +----------+------------+---------+-----------+----------+-------+  Left Findings: +----------+------------+---------+-----------+----------+--------------+ LEFT      CompressiblePhasicitySpontaneousProperties   Summary     +----------+------------+---------+-----------+----------+--------------+ IJV           Full       Yes       Yes                             +----------+------------+---------+-----------+----------+--------------+  Subclavian    Full       Yes       Yes                             +----------+------------+---------+-----------+----------+--------------+ Axillary      Full       Yes       Yes                             +----------+------------+---------+-----------+----------+--------------+ Brachial      Full                                                 +----------+------------+---------+-----------+----------+--------------+ Radial        Full                                                 +----------+------------+---------+-----------+----------+--------------+ Ulnar         Full                                                 +----------+------------+---------+-----------+----------+--------------+ Cephalic      Full                                                 +----------+------------+---------+-----------+----------+--------------+ Basilic                                             Not visualized +----------+------------+---------+-----------+----------+--------------+ The left basilic vein was not visualized.  Incidental finding of a non-vascularized, mostly anechoic mass noted in the shoulder area and extending into the upper arm.  Summary:  Right: No evidence of thrombosis in the subclavian.  Left: No evidence of deep vein thrombosis in the upper extremity. No evidence of superficial vein thrombosis in the upper extremity.  *See table(s) above for  measurements and observations.    Preliminary      Assessment: 36 y.o. male with a PMHx of anemia, pyoderma gangrenosum, CHF, DVT, dysrhythmia, mitral regurgitation, history of mitral valve replacement, MI, paroxysmal atrial flutter, arthritis and tricuspid regurgitation who presented to the hospital on 7/14 with vomiting and diarrhea. He was diagnosed with gastroenteritis. Shortly after admission he became progressively more tachypneic and then developed shock, likely cardiogenic.  He was transferred to the ICU. 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 On Thursday he developed left arm pain and weakness, for which an MRI brain was obtained, revealing several small acute right MCA territory ischemic strokes, suggestive of a cardioembolic etiology.  1. Exam reveals left face and upper extremity sensory deficits in conjunction with 2-3/5 LUE weakness.  2. MRI brain: Patchy small volume acute ischemic nonhemorrhagic cortical infarcts involving the subcortical right frontoparietal region. These are likely embolic in nature.  Underlying mild chronic microvascular ischemic disease, advanced for age. Small remote right cerebellar infarct. Multiple scattered chronic micro hemorrhages involving the cerebellum and both cerebral hemispheres, nonspecific, but favored to be secondary to poorly controlled hypertension 3. TEE 7/15 severe biventricular failure. + vegetation on mechanical MVR 4. 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 CTA of head and neck 2. Continue anticoagulation with warfarin to INR goal of 2.5-3.5 as recommended by Cardiology in the setting of mechanical mitral valve and PAF. Also on ASA per Cardiology. The strokes are small and would be with relatively low risk of hemorrhagic conversion if they were not due to pMVR endocarditis. The presence of the vegetation with possible septic emboli does increase the risk of  hemorrhagic conversion, but overall potential for morbidity and mortality due to recurrent stroke is felt to be significantly higher if off anticoagulation.  4. Continuing  vanc/cefepime per ID for pMVR endocarditis 5. PT/OT/Speech 6. BP management per Cardiology 7. Stroke Team to follow  Electronically signed: Dr. Caryl Pina 01/10/2022, 12:53 AM

## 2022-01-10 NOTE — Progress Notes (Addendum)
STROKE TEAM PROGRESS NOTE   INTERVAL HISTORY LUE swollen, painful- Upper extremity venous dupl neg, CT- fluid pocket, concern for cellulitis Symptoms started yesterday as pain in the left upper extremity near his bicep and then he developed numbness.  CTA head and neck ordered Coumadin bridge with lovenox- goal 2.5-3.5- current INR 2.0  Vitals:   01/09/22 1941 01/09/22 2331 01/10/22 0424 01/10/22 0748  BP: 130/87 125/83 125/86 (!) 135/95  Pulse: 74 76 78 76  Resp: 20 20 19 14   Temp: 97.8 F (36.6 C) 97.9 F (36.6 C) 97.7 F (36.5 C) 97.9 F (36.6 C)  TempSrc: Oral Oral Oral Oral  SpO2: 97% 96% 98% 99%  Weight:   87 kg   Height:       CBC:  Recent Labs  Lab 01/09/22 0624 01/10/22 0505  WBC 8.0 7.5  HGB 10.0* 10.2*  HCT 31.3* 31.4*  MCV 82.2 81.1  PLT 397 380   Basic Metabolic Panel:  Recent Labs  Lab 01/05/22 0342 01/06/22 0409 01/09/22 0624 01/10/22 0505  NA 137   < > 133* 130*  K 4.6   < > 4.5 3.9  CL 104   < > 103 102  CO2 28   < > 23 22  GLUCOSE 106*   < > 83 120*  BUN 17   < > 21* 13  CREATININE 0.74   < > 0.82 0.77  CALCIUM 8.5*   < > 9.1 9.0  MG 1.6*   < > 1.7 1.6*  PHOS 3.3  --   --   --    < > = values in this interval not displayed.   Lipid Panel: No results for input(s): "CHOL", "TRIG", "HDL", "CHOLHDL", "VLDL", "LDLCALC" in the last 168 hours. HgbA1c: No results for input(s): "HGBA1C" in the last 168 hours. Urine Drug Screen: No results for input(s): "LABOPIA", "COCAINSCRNUR", "LABBENZ", "AMPHETMU", "THCU", "LABBARB" in the last 168 hours.  Alcohol Level No results for input(s): "ETH" in the last 168 hours.  IMAGING past 24 hours MR BRAIN WO CONTRAST  Result Date: 01/09/2022 CLINICAL DATA:  Initial evaluation for neuro deficit, stroke suspected. EXAM: MRI HEAD WITHOUT CONTRAST TECHNIQUE: Multiplanar, multiecho pulse sequences of the brain and surrounding structures were obtained without intravenous contrast. COMPARISON:  Head CT from earlier the  same day. FINDINGS: Brain: Cerebral volume within normal limits. Mild hazy and patchy T2/FLAIR signal intensity seen involving the periventricular and deep white matter both cerebral hemispheres, nonspecific, but most characteristic of chronic small vessel ischemic disease, advanced for age. Ischemia. Small remote right cerebellar infarct noted. Patchy small volume foci of restricted diffusion seen involving the cortical subcortical right frontoparietal region (series 15, images 85, 84, 81), consistent with small acute ischemic infarcts. These are likely embolic in nature. No associated hemorrhage or mass effect. No other evidence for acute or subacute ischemia. No acute intracranial hemorrhage. Multiple scattered chronic micro hemorrhages noted involving the cerebellum and both cerebral hemispheres, nonspecific, but favored to be hypertensive in nature. No mass lesion, midline shift or mass effect. No hydrocephalus or extra-axial fluid collection. Pituitary gland and suprasellar region within normal limits. Vascular: Major intracranial vascular flow voids are maintained. Skull and upper cervical spine: Craniocervical junction within normal limits. Bone marrow signal intensity diffusely decreased on T1 weighted sequence, nonspecific, but most commonly related to anemia, smoking or obesity. No focal marrow replacing lesion. Few small cystic lesions measuring up to 1.8 cm seen within the visualized left temporal and pre-auricular region, nonspecific, but  likely sebaceous cyst (series 27, images 10, 6). Sinuses/Orbits: Globes and orbital soft tissues within normal limits. Paranasal sinuses are largely clear. Small right mastoid effusion, of doubtful significance. Other: None. IMPRESSION: 1. Patchy small volume acute ischemic nonhemorrhagic cortical infarcts involving the subcortical right frontoparietal region. These are likely embolic in nature. 2. Underlying mild chronic microvascular ischemic disease, advanced for  age. Small remote right cerebellar infarct. 3. Multiple scattered chronic micro hemorrhages involving the cerebellum and both cerebral hemispheres, nonspecific, but favored to be secondary to poorly controlled hypertension. Electronically Signed   By: Rise Mu M.D.   On: 01/09/2022 23:42   CT HEAD WO CONTRAST ( )  Result Date: 01/09/2022 CLINICAL DATA:  Neuro deficit, acute stroke suspected. Generalized weakness. EXAM: CT HEAD WITHOUT CONTRAST TECHNIQUE: Contiguous axial images were obtained from the base of the skull through the vertex without intravenous contrast. RADIATION DOSE REDUCTION: This exam was performed according to the departmental dose-optimization program which includes automated exposure control, adjustment of the mA and/or kV according to patient size and/or use of iterative reconstruction technique. COMPARISON:  CT head 07/24/2021 FINDINGS: Brain: There is no evidence of acute intracranial hemorrhage, mass lesion, brain edema or extra-axial fluid collection. The ventricles and subarachnoid spaces are appropriately sized for age. There is no CT evidence of acute cortical infarction. Vascular:  No hyperdense vessel identified. Skull: Negative for fracture or focal lesion. Sinuses/Orbits: The visualized paranasal sinuses and mastoid air cells are clear. No orbital abnormalities are seen. Other: Small ossific density in the right supraorbital scalp (image 39/4) is unchanged. Probable sebaceous cyst anterior to the left ear (image 5/3), unchanged. IMPRESSION: Stable head CT without acute intracranial findings. No CT evidence of acute stroke. Electronically Signed   By: Carey Bullocks M.D.   On: 01/09/2022 17:55   VAS [place] UPPER EXTREMITY VENOUS DUPLEX  Result Date: 01/09/2022 UPPER VENOUS STUDY  Patient Name:  Donald [name]  Date of Exam:   01/09/2022 Medical Rec #: [id number]            Accession #:    [id number] Date of Birth: 1986-02-14           Patient Gender: M Patient  Age:   36 years Exam Location:  [facility] Procedure:      VAS [place] UPPER EXTREMITY VENOUS DUPLEX Referring Phys: Janit Pagan --------------------------------------------------------------------------------  Indications: Pain, and Swelling Limitations: Patient movement. Comparison Study: No previous exam noted. Performing Technologist: Magdalene River BS, RVT  Examination Guidelines: A complete evaluation includes B-mode imaging, spectral Doppler, color Doppler, and power Doppler as needed of all accessible portions of each vessel. Bilateral testing is considered an integral part of a complete examination. Limited examinations for reoccurring indications may be performed as noted.  Right Findings: +----------+------------+---------+-----------+----------+-------+ RIGHT     CompressiblePhasicitySpontaneousPropertiesSummary +----------+------------+---------+-----------+----------+-------+ Subclavian    Full       Yes       Yes                      +----------+------------+---------+-----------+----------+-------+  Left Findings: +----------+------------+---------+-----------+----------+--------------+ LEFT      CompressiblePhasicitySpontaneousProperties   Summary     +----------+------------+---------+-----------+----------+--------------+ IJV           Full       Yes       Yes                             +----------+------------+---------+-----------+----------+--------------+ Subclavian  Full       Yes       Yes                             +----------+------------+---------+-----------+----------+--------------+ Axillary      Full       Yes       Yes                             +----------+------------+---------+-----------+----------+--------------+ Brachial      Full                                                 +----------+------------+---------+-----------+----------+--------------+ Radial        Full                                                  +----------+------------+---------+-----------+----------+--------------+ Ulnar         Full                                                 +----------+------------+---------+-----------+----------+--------------+ Cephalic      Full                                                 +----------+------------+---------+-----------+----------+--------------+ Basilic                                             Not visualized +----------+------------+---------+-----------+----------+--------------+ The left basilic vein was not visualized.  Incidental finding of a non-vascularized, mostly anechoic mass noted in the shoulder area and extending into the upper arm.  Summary:  Right: No evidence of thrombosis in the subclavian.  Left: No evidence of deep vein thrombosis in the upper extremity. No evidence of superficial vein thrombosis in the upper extremity.  *See table(s) above for measurements and observations.    Preliminary     PHYSICAL EXAM  Physical Exam  Constitutional: Appears well-developed and well-nourished.   Cardiovascular: Normal rate and regular rhythm.  Respiratory: Effort normal, non-labored breathing  Neuro: Mental Status: Patient is awake, alert, oriented to person, place, month, year, and situation. Patient is able to give a clear and coherent history. No signs of aphasia or neglect Cranial Nerves: II: Visual Fields are full. Pupils are equal, round, and reactive to light.   III,IV, VI: EOMI without ptosis or diploplia.  V: Facial sensation is symmetric to temperature VII: Facial movement is symmetric resting and smiling VIII: Hearing is intact to voice X: Palate elevates symmetrically XI: Shoulder shrug is symmetric. XII: Tongue protrudes midline without atrophy or fasciculations.  Motor: Tone is normal. Bulk is normal RUE 5/5  LUE 3-/5 RLE 5/5  LLE 5/5 Sensory: LUE numbness, sensation intact in other extremities Cerebellar: Ataxia not out of  proportion to weakness on left   ASSESSMENT/PLAN Mr. TORRES HARDENBROOK is a 36 y.o. male with history of biventricular heart failure, severe mitral valve regurgitation s/p MVR, afib, hidradenitis , and pyoderma gangrenosum presenting with nausea, vomiting, and diarrhea on 7/14. Transferred out of ICU on 7/25.  7/27 He developed left arm pain and weakness and MRI brain was obtained. Showed several small acute right MCA territory ischemic strokes suspicious for cardio embolic process in the setting of paroxysmal afib and low EF.  Stroke:  Several small acute infarcts the right MCA territory, consistent with cardioembolic source in the setting of A-fib/a flutter, mechanical mitral wall, cardiomyopathy with low EF Code Stroke CT head No acute abnormality. CTA head & neck No mycotic aneurysms MRI  several small acute right MCA territory ischemic strokes involving subcortical right frontoparietal region, scattered chronic mircohemorrhages TEE-  EF <20%, vegetation on mitral valve LDL 163 HgbA1c 5.6 UDS negative (13 days after admission) VTE prophylaxis - warfarin and Lovenox aspirin 81 mg daily and warfarin daily prior to admission, now on aspirin 81 mg daily and warfarin daily. Currently on lovenox bridge to warfarin.  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 August 29th, 2023 following- work up for transplant lasix 40 mg PO, digoxin 0.125 mg daily, farxiga 10 mg daily.  LUE  swelling and pain LUE tender to touch, redness, warm and swelling Venous ultrasound showed no DVT or SVT CT left arm showed cellulitis and pyomyositis Management per primary team  Hyperlipidemia Home meds: None LDL 163 Start Crestor 40mg  dialy Continue statin on discharge  Other Stroke Risk Factors History of DVT CAD/MI  Other Active Problems Left upper extremity swelling CT- cellulitis and pyomyositis involving the left axilla and anterior compartment of the left upper arm. Hypomagnesemia Cardiogenic shock- resolved  Transferred out of ICU 7/25  Hospital day # 13  Patient seen and examined by NP/APP with MD. MD to update note as needed.   , DNP, FNP-BC [facility] Pager: [phone]  ATTENDING NOTE: I reviewed above note and agree with the assessment and plan. Pt was seen and examined.   36 year old male with history of CHF, CAD/MI, A-fib/a flutter, MRI status post MVR admitted for nausea vomiting and diarrhea.  Diagnosed with gastritis.  However developed cardiogenic shock and found to have endocarditis on TEE.  Currently on antibiotics.  Developed left arm pain and weakness which promoted for MRI showed right MCA small scattered infarct.  CT head and neck no mycotic aneurysms.  TEE showed EF less than 20% and vegetation at mitral wall prothesis.  LDL 163, A1c 5.6, UDS negative after 13 days of duration.  Creatinine 0.77  On exam, patient awake alert, orientated x3, no aphasia, follows simple commands.  Neuro exam only showed left upper extremity 3/5 proximal and distal associate with left arm pain and swelling.  Patient does have right MCA small infarct, but the left arm pain and weakness could also partially due to cellulitis and soft tissue inflammation.  Etiology for patient stroke  likely due to cardioembolic source from A-fib/a flutter, cardiomyopathy with low EF, mechanical mitral valve.  CTA head and neck no mycotic aneurysm, okay to continue  antithrombotics.  Currently on aspirin 81 and Coumadin bridging with Lovenox therapeutic dose.  Today INR 2.0.  Goal INR 2.5-3.5.  Put on Crestor due to elevated LDL.  Continue cefepime and vancomycin for endocarditis.   For detailed assessment and plan, please refer to above/below as I have made changes wherever appropriate.   Neurology will sign off. Please call with questions. Pt will follow up with stroke clinic NP at [facility] in about 4 weeks. Thanks for the consult.   Marvel Plan, MD PhD Stroke Neurology 01/10/2022 6:57 PM    To contact Stroke Continuity provider, please refer to [email or web address]. After hours, contact General Neurology

## 2022-01-10 NOTE — Progress Notes (Signed)
Pt declined ambulation. Walked with PT earlier and now eager to work with OT for his arm. Encouraged him to walk as able independently. [id number] Ethelda Chick CES, ACSM 01/10/2022 1:48 PM

## 2022-01-10 NOTE — Progress Notes (Signed)
Patient states not able to use left arm at all but using independently when I entered room. Measured arm this morning and size remains 42 cm. Patient has noted weakness and discomfort to left arm.  Pt currently eating breakfast with mom in room. Pt resting with call bell within reach.  Will continue to monitor. Thomas Hoff, RN

## 2022-01-10 NOTE — Progress Notes (Signed)
Donald [name] [birth year]  [id number].    Requesting MD: Gwendlyn Deutscher, MD Chief Complaint/Reason for Consult: Left axillary abscess  HPI:  Donald [name] is a 36 year old male with a history of autoimmune disease, pyoderma gangrenosum, atrial fibrillation, mitral valve replacement in 2019 for severe mitral regurg, recurrent severe mitral regurgitation and CHF with a reduced ejection fraction resulting in median sternotomy, mitral valve replacement, with cardiopulmonary bypass at [facility] 05/2021, and DVT who presented to [facility] emergency department 12/27/2021 with diarrhea, nausea, vomiting, and fever.  He was admitted and admitted for management of sepsis with suspected GI etiology.  He developed hypoxia and cardiogenic shock requiring intubation. He was diagnosed with mitral valve endocarditis. cardiology was consulted (his cardiologist is Dr. Haroldine Laws) as well as infectious disease.  His shock improved with IV antibiotics, diuresis, inotropes/pressors.  On 7/27 he developed left arm pain and numbness.  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  CT of the left upper extremity was ordered due to swelling and tenderness in the left axillary region.  This revealed a axillary abscess that extended to the left brachialis muscle.  General surgery was asked to see for surgical drainage of abscess.  Patient currently on Coumadin.  At baseline lives with his parents.  He tells me he did not have pain or discomfort in the left axilla until 24 hours ago.  He denies a history of similar symptoms.  He has had a gluteal abscess drained in the past, but reports no other procedures for abscess drainage.    ROS: Review of Systems  All other systems reviewed and are negative.   Family History  Problem Relation Age of Onset   Multiple sclerosis Mother    Psoriasis Mother    Depression Father    Diabetes Father    Diabetes  Paternal Grandmother     Past Medical History:  Diagnosis Date   Anemia    Autoimmune disorder ([place])    pyoderma gangrenosum   CHF (congestive heart failure) ([facility])    Chronic systolic heart failure ([place])    a. EF 35-40% by echo in 07/2018 b. EF at 45% by repeat echo in 04/2020   DVT (deep venous thrombosis) (H[facility])    h/o   Dysrhythmia    Mitral regurgitation    a. 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 b. 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HCC)    arthritis   Tricuspid regurgitation     Past Surgical History:  Procedure Laterality Date   CARDIAC CATHETERIZATION     HERNIA REPAIR Right 2018   MITRAL VALVE REPAIR  06/07/2018   [facility] - Dr Virgina Organ   MITRAL VALVE REPLACEMENT N/A 06/23/2021   MULTIPLE EXTRACTIONS WITH ALVEOLOPLASTY N/A 11/08/2020   Procedure: EXTRACTION OF TEETH NUMBER ONE, FIFTHTEEN, SIXTEEN, SEVENTEEN, EIGHTTEEN AND THRTY WITH ALVEOLOPLASTY OF LOWER LEFT QUADRANT.;  Surgeon: Charlaine Dalton, DMD;  Location: [facility];  Service: Dentistry;  Laterality: N/A;   RIGHT/LEFT HEART CATH AND CORONARY ANGIOGRAPHY N/A 09/11/2020   Procedure: RIGHT/LEFT HEART CATH AND CORONARY ANGIOGRAPHY;  Surgeon: Jolaine Artist, MD;  Location: [facility];  Service: Cardiovascular;  Laterality: N/A;   TEE WITHOUT  CARDIOVERSION N/A 05/23/2020   Procedure: TRANSESOPHAGEAL ECHOCARDIOGRAM (TEE) WITH PROPOFOL;  Surgeon: Arnoldo Lenis, MD;  [facility];  Service: Endoscopy;  Laterality: N/A;    Social History:  reports that he quit smoking about 14 months ago. His smoking use included cigarettes. He has never used smokeless tobacco. He reports that he does not drink alcohol and does not use drugs.  Allergies: No  Known Allergies  Medications Prior to Admission  Medication Sig Dispense Refill   acetaminophen (TYLENOL) 325 MG tablet Take 2 tablets (650 mg total) by mouth every 4 (four) hours as needed for headache or mild pain.     amiodarone (PACERONE) 200 MG tablet TAKE 1 TABLET BY MOUTH TWICE DAILY FOR  4  WEEKS,  THEN  DECREASE  TO  1  TABLET  DAILY 56 tablet 0   dapagliflozin propanediol (FARXIGA) 10 MG TABS tablet Take 1 tablet (10 mg total) by mouth daily. 30 tablet 3   furosemide (LASIX) 40 MG tablet Take 1 tablet (40 mg total) by mouth daily. 30 tablet 1   losartan (COZAAR) 25 MG tablet Take 1 tablet (25 mg total) by mouth daily. 90 tablet 3   pantoprazole (PROTONIX) 40 MG tablet Take 1 tablet by mouth once daily 30 tablet 0   potassium chloride SA (KLOR-CON M) 20 MEQ tablet Take 2 tablets (40 mEq total) by mouth daily. 180 tablet 3   spironolactone (ALDACTONE) 25 MG tablet Take 1 tablet (25 mg total) by mouth daily. (Patient taking differently: Take 12.5 mg by mouth daily.) 90 tablet 1   [DISCONTINUED] warfarin (COUMADIN) 2 MG tablet Take 1 - 2 tablets daily or as directed by coumadin clinic (Patient taking differently: Take 4 mg by mouth every evening. As directed by coumadin clinic) 30 tablet 4   doxycycline (VIBRAMYCIN) 100 MG capsule Take 1 capsule (100 mg total) by mouth 2 (two) times daily. (Patient not taking: Reported on 12/27/2021) 20 capsule 0   oxyCODONE-acetaminophen (PERCOCET/ROXICET) 5-325 MG tablet Take 1 tablet by mouth every 6 (six) hours as needed for severe pain. (Patient not taking: Reported on 12/27/2021) 10 tablet 0   predniSONE (DELTASONE) 20 MG tablet Take 2 tablets (40 mg total) by mouth daily. Take 2 tablets (40 mg) daily for the next 5 days, then take 1 tablet (20 mg) daily for 5 days, then take 0.5 tablet (10mg ) for 4 days (Patient not taking: Reported on 12/27/2021) 17 tablet 0     Physical Exam: Blood pressure 112/86, pulse 77, temperature 97.9 F (36.6 C), temperature  source Oral, resp. rate 17, height 5\' 10"  (1.778 m), weight 87 kg, SpO2 99 %. General: Pleasant black male lying in bed in no acute distress HEENT: head -normocephalic, atraumatic; pupils equal Neck- Trachea is midline CV- RRR, no lower extremity edema Pulm- breathing is non-labored on room air Abd- soft, NT/ND, appropriate bowel sounds in 4 quadrants, no masses, hernias, or organomegaly. GU- deferred  MSK-left arm weakness is present.  There is induration in the left axilla along with swelling of the left upper arm.  Compartments are soft and compressible.  He has a palpable left radial pulse and his fingers are warm. Neuro-left-sided weakness, normal speech.  Gait not assessed. Psych- Alert and Oriented x3 with appropriate affect Skin: warm and dry, no rashes or lesions   Results for orders placed or performed during the hospital encounter of 12/27/21 (from the past 48 hour(s))  Glucose, capillary     Status: None  Collection Time: 01/08/22  4:29 PM  Result Value Ref Range   Glucose-Capillary 92 70 - 99 mg/dL    Comment: Glucose reference range applies only to samples taken after fasting for at least 8 hours.  Protime-INR     Status: Abnormal   Collection Time: 01/09/22  6:24 AM  Result Value Ref Range   Prothrombin Time 20.1 (H) 11.4 - 15.2 seconds   INR 1.7 (H) 0.8 - 1.2    Comment: (NOTE) INR goal varies based on device and disease states. Performed at [facility], [place] [address], [place], [place] [address]   Magnesium     Status: None   Collection Time: 01/09/22  6:24 AM  Result Value Ref Range   Magnesium 1.7 1.7 - 2.4 mg/dL    Comment: Performed at [facility] [address] [place], [place] [id number]  Basic metabolic panel     Status: Abnormal   Collection Time: 01/09/22  6:24 AM  Result Value Ref Range   Sodium 133 (L) 135 - 145 mmol/L   Potassium 4.5 3.5 - 5.1 mmol/L   Chloride 103 98 - 111 mmol/L   CO2 23 22 - 32 mmol/L   Glucose, Bld 83 70 - 99  mg/dL    Comment: Glucose reference range applies only to samples taken after fasting for at least 8 hours.   BUN 21 (H) 6 - 20 mg/dL   Creatinine, Ser 0.82 0.61 - 1.24 mg/dL   Calcium 9.1 8.9 - 10.3 mg/dL   GFR, Estimated >60 >60 mL/min    Comment: (NOTE) Calculated using the CKD-EPI Creatinine Equation (2021)    Anion gap 7 5 - 15    Comment: Performed at [facility] [address], [place], [place] [address]  CBC     Status: Abnormal   Collection Time: 01/09/22  6:24 AM  Result Value Ref Range   WBC 8.0 4.0 - 10.5 K/uL   RBC 3.81 (L) 4.22 - 5.81 MIL/uL   Hemoglobin 10.0 (L) 13.0 - 17.0 g/dL   HCT 31.3 (L) 39.0 - 52.0 %   MCV 82.2 80.0 - 100.0 fL   MCH 26.2 26.0 - 34.0 pg   MCHC 31.9 30.0 - 36.0 g/dL   RDW 18.2 (H) 11.5 - 15.5 %   Platelets 397 150 - 400 K/uL   nRBC 0.0 0.0 - 0.2 %    Comment: Performed at [facility], [place] [address], [place], [place] [address]  Digoxin level     Status: Abnormal   Collection Time: 01/09/22  6:24 AM  Result Value Ref Range   Digoxin Level 0.5 (L) 0.8 - 2.0 ng/mL    Comment: Performed at [facility], [place] [address], [place], [place] [address]  Protime-INR     Status: Abnormal   Collection Time: 01/10/22  5:05 AM  Result Value Ref Range   Prothrombin Time 22.5 (H) 11.4 - 15.2 seconds   INR 2.0 (H) 0.8 - 1.2    Comment: (NOTE) INR goal varies based on device and disease states. Performed at [facility], [place] [address], [place], [place] [address]   Magnesium     Status: Abnormal   Collection Time: 01/10/22  5:05 AM  Result Value Ref Range   Magnesium 1.6 (L) 1.7 - 2.4 mg/dL    Comment: Performed at [facility] [address], [place], [place] [id number]  Basic metabolic panel  Status: Abnormal   Collection Time: 01/10/22  5:05 AM  Result Value Ref Range   Sodium 130 (L) 135 - 145 mmol/L   Potassium 3.9 3.5 - 5.1 mmol/L   Chloride 102 98 - 111 mmol/L   CO2 22 22 - 32 mmol/L   Glucose, Bld 120 (H)  70 - 99 mg/dL    Comment: Glucose reference range applies only to samples taken after fasting for at least 8 hours.   BUN 13 6 - 20 mg/dL   Creatinine, Ser 8.09 0.61 - 1.24 mg/dL   Calcium 9.0 8.9 - 98.3 mg/dL   GFR, Estimated >38 >25 mL/min    Comment: (NOTE) Calculated using the CKD-EPI Creatinine Equation (2021)    Anion gap 6 5 - 15    Comment: Performed at [facility], [address], [place], [place] [address]  CBC     Status: Abnormal   Collection Time: 01/10/22  5:05 AM  Result Value Ref Range   WBC 7.5 4.0 - 10.5 K/uL   RBC 3.87 (L) 4.22 - 5.81 MIL/uL   Hemoglobin 10.2 (L) 13.0 - 17.0 g/dL   HCT 67.3 (L) 41.9 - 37.9 %   MCV 81.1 80.0 - 100.0 fL   MCH 26.4 26.0 - 34.0 pg   MCHC 32.5 30.0 - 36.0 g/dL   RDW 02.4 (H) 09.7 - 35.3 %   Platelets 380 150 - 400 K/uL   nRBC 0.0 0.0 - 0.2 %    Comment: Performed at [facility], [address], [place], [place] [address]  CK     Status: None   Collection Time: 01/10/22  5:05 AM  Result Value Ref Range   Total CK 180 49 - 397 U/L    Comment: Performed at [facility], [address], [place], [place] [address]  Hemoglobin A1c     Status: None   Collection Time: 01/10/22  5:05 AM  Result Value Ref Range   Hgb A1c MFr Bld 5.6 4.8 - 5.6 %    Comment: (NOTE) Pre diabetes:          5.7%-6.4%  Diabetes:              >6.4%  Glycemic control for   <7.0% adults with diabetes    Mean Plasma Glucose 114.02 mg/dL    Comment: Performed at [facility], [address] [place], [place] [address]  Lipid panel     Status: Abnormal   Collection Time: 01/10/22  5:05 AM  Result Value Ref Range   Cholesterol 217 (H) 0 - 200 mg/dL   Triglycerides 63 <297 mg/dL   HDL 41 >98 mg/dL   Total CHOL/HDL Ratio 5.3 RATIO   VLDL 13 0 - 40 mg/dL   LDL Cholesterol 921 (H) 0 - 99 mg/dL    Comment:        Total Cholesterol/HDL:CHD Risk Coronary Heart Disease Risk Table                     Men   Women  1/2 Average Risk   3.4    3.3  Average Risk       5.0   4.4  2 X Average Risk   9.6   7.1  3 X Average Risk  23.4   11.0        Use the calculated Patient Ratio above and the CHD Risk Table to determine the patient's CHD Risk.  ATP III CLASSIFICATION (LDL):  <100     mg/dL   Optimal  100-129  mg/dL   Near or Above                    Optimal  130-159  mg/dL   Borderline  160-189  mg/dL   High  >190     mg/dL   Very High Performed at [facility] [address] [place], [place] [address]   TSH     Status: None   Collection Time: 01/10/22  9:15 AM  Result Value Ref Range   TSH 3.866 0.350 - 4.500 uIU/mL    Comment: Performed by a 3rd Generation assay with a functional sensitivity of <=0.01 uIU/mL. Performed at [facility], [place] [address], [place], [place] [address]    CT ANGIO HEAD NECK W WO CM  Result Date: 01/10/2022 CLINICAL DATA:  Provided history: Stroke/TIA, determine embolic source. EXAM: CT ANGIOGRAPHY HEAD AND NECK TECHNIQUE: Multidetector CT imaging of the head and neck was performed using the standard protocol during bolus administration of intravenous contrast. Multiplanar CT image reconstructions and MIPs were obtained to evaluate the vascular anatomy. Carotid stenosis measurements (when applicable) are obtained utilizing NASCET criteria, using the distal internal carotid diameter as the denominator. RADIATION DOSE REDUCTION: This exam was performed according to the departmental dose-optimization program which includes automated exposure control, adjustment of the mA and/or kV according to patient size and/or use of iterative reconstruction technique. CONTRAST:  83mL OMNIPAQUE IOHEXOL 350 MG/ML SOLN COMPARISON:  Brain MRI 01/09/2022.  Head CT 01/09/2022. FINDINGS: CT HEAD FINDINGS Brain: No age advanced or lobar predominant parenchymal atrophy. Known small acute right frontoparietal lobe infarcts were better appreciated on the brain MRI of 01/09/2022. Background mild chronic small  vessel ischemic changes within the cerebral white matter, also better appreciated on yesterday's MRI. Redemonstrated small chronic infarct within the right cerebellar hemisphere. Partially empty sella turcica, a nonspecific finding. There is no acute intracranial hemorrhage. No extra-axial fluid collection. No evidence of an intracranial mass. No midline shift. Vascular: No hyperdense vessel. Skull: No fracture or aggressive osseous lesion. Sinuses/Orbits: No mass or acute finding within the imaged orbits. No significant paranasal sinus disease. Other: Small-volume fluid within the right mastoid air cells. Review of the MIP images confirms the above findings CTA NECK FINDINGS Aortic arch: Common origin of the innominate and left common carotid arteries. The visualized aortic arch is normal in caliber. No hemodynamically significant innominate or proximal subclavian artery stenosis. Right carotid system: CCA and ICA patent within the neck without stenosis or significant atherosclerotic disease. Left carotid system: CCA and ICA patent within the neck without stenosis or significant atherosclerotic disease. Tortuosity of the cervical ICA. Vertebral arteries: Vertebral arteries codominant and patent within the neck without stenosis or significant atherosclerotic disease. Skeleton: No acute fracture or aggressive osseous lesion. Other neck: No neck mass or cervical lymphadenopathy. Upper chest: Prior median sternotomy. No consolidation within the imaged lung apices. Other: Partially imaged nonspecific edema/stranding within the left axilla. Review of the MIP images confirms the above findings CTA HEAD FINDINGS Anterior circulation: The intracranial internal carotid arteries are patent. The M1 middle cerebral arteries are patent. No M2 proximal branch occlusion or high-grade proximal stenosis. The anterior cerebral arteries are patent. No intracranial aneurysm is identified. Posterior circulation: The intracranial  vertebral arteries are patent. The basilar artery is patent. The posterior cerebral arteries are patent. Posterior communicating arteries are present bilaterally. Venous sinuses: Within  the limitations of contrast timing, no convincing thrombus. Anatomic variants: As described. Other: Ovoid cystic-appearing lesions within the left temporal and left preauricular soft tissues measuring up to 1.7 cm. Review of the MIP images confirms the above findings IMPRESSION: CT head: 1. Known small right frontoparietal lobe acute infarcts were better appreciated on the brain MRI of 01/09/2022. 2. Background mild chronic small vessel image changes within the cerebral white matter. 3. Redemonstrated small chronic infarcts within the right cerebellar hemisphere. 4. Small right mastoid effusion. CTA neck: 1. The common carotid, internal carotid and vertebral arteries are patent within the neck without stenosis or significant atherosclerotic disease. 2. Partially imaged nonspecific edema/stranding within the left axilla. Correlate clinically and consider dedicated imaging. CTA head: 1. No intracranial large vessel occlusion or proximal high-grade arterial stenosis. 2. Cystic-appearing lesions within the left temporal and preauricular soft tissues, measuring up to 1.7 cm. Direct visualization recommended. Electronically Signed   By: Kellie Simmering D.O.   On: 01/10/2022 10:13   CT Extrem Up Entire Arm L W/CM  Result Date: 01/10/2022 CLINICAL DATA:  Soft tissue mass in the right upper arm. EXAM: CT OF THE UPPER LEFT EXTREMITY WITH CONTRAST TECHNIQUE: Multidetector CT imaging of the upper left extremity was performed according to the standard protocol following intravenous contrast administration. RADIATION DOSE REDUCTION: This exam was performed according to the departmental dose-optimization program which includes automated exposure control, adjustment of the mA and/or kV according to patient size and/or use of iterative  reconstruction technique. CONTRAST:  75 mL OMNIPAQUE IOHEXOL 350 MG/ML SOLN COMPARISON:  None Available. FINDINGS: Bones/Joint/Cartilage No acute bony or joint abnormality is identified. No focal bone lesion is seen. 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 No joint effusion is identified. Ligaments Suboptimally assessed by CT. Muscles and Tendons There is a fluid collection in the left axilla which extends into the coracobrachialis muscle. The collection measures approximately 4 cm transverse by 3 cm AP by 6.5 cm craniocaudal. Although visualization is somewhat limited as the patient's arm is at his side, an additional fluid collection is seen in the brachialis muscle which measures approximately 2.7 by 2.2 by 3.2 cm. Just lateral to this collection a third smaller collection measuring 1.4 by 1.4 cm in the axial plane is identified. There is stranding in the soft tissues in the anterior aspect of the left upper arm and left axilla. Soft tissues Imaged left lung parenchyma demonstrates mild atelectasis. Marked cardiomegaly is partially visualized. IMPRESSION: Findings most consistent with cellulitis and pyomyositis involving the left axilla and anterior compartment of the left upper arm. Large collection is in the left axilla and extends into the coracobrachialis as described above. MRI with IV contrast is a more sensitive test for detection pyomyositis. Electronically Signed   By: Inge Rise M.D.   On: 01/10/2022 10:06   MR BRAIN WO CONTRAST  Result Date: 01/09/2022 CLINICAL DATA:  Initial evaluation for neuro deficit, stroke suspected. EXAM: MRI HEAD WITHOUT CONTRAST TECHNIQUE: Multiplanar, multiecho pulse sequences of the brain and surrounding structures were obtained without intravenous contrast. COMPARISON:  Head CT from earlier the same day. FINDINGS: Brain: Cerebral volume within normal limits. Mild hazy and patchy  T2/FLAIR signal intensity seen involving the periventricular and deep white matter both cerebral hemispheres, nonspecific, but most characteristic of chronic small vessel ischemic disease, advanced for age. Ischemia. Small remote right cerebellar infarct noted. Patchy small volume foci of restricted diffusion seen involving the cortical subcortical  right frontoparietal region (series 15, images 85, 84, 81), consistent with small acute ischemic infarcts. These are likely embolic in nature. No associated hemorrhage or mass effect. No other evidence for acute or subacute ischemia. No acute intracranial hemorrhage. Multiple scattered chronic micro hemorrhages noted involving the cerebellum and both cerebral hemispheres, nonspecific, but favored to be hypertensive in nature. No mass lesion, midline shift or mass effect. No hydrocephalus or extra-axial fluid collection. Pituitary gland and suprasellar region within normal limits. Vascular: Major intracranial vascular flow voids are maintained. Skull and upper cervical spine: Craniocervical junction within normal limits. Bone marrow signal intensity diffusely decreased on T1 weighted sequence, nonspecific, but most commonly related to anemia, smoking or obesity. No focal marrow replacing lesion. Few small cystic lesions measuring up to 1.8 cm seen within the visualized left temporal and pre-auricular region, nonspecific, but likely sebaceous cyst (series 27, images 10, 6). Sinuses/Orbits: Globes and orbital soft tissues within normal limits. Paranasal sinuses are largely clear. Small right mastoid effusion, of doubtful significance. Other: None. IMPRESSION: 1. Patchy small volume acute ischemic nonhemorrhagic cortical infarcts involving the subcortical right frontoparietal region. These are likely embolic in nature. 2. Underlying mild chronic microvascular ischemic disease, advanced for age. Small remote right cerebellar infarct. 3. Multiple scattered chronic micro  hemorrhages involving the cerebellum and both cerebral hemispheres, nonspecific, but favored to be secondary to poorly controlled hypertension. Electronically Signed   By: Rise Mu M.D.   On: 01/09/2022 23:42   CT HEAD WO CONTRAST ( )  Result Date: 01/09/2022 CLINICAL DATA:  Neuro deficit, acute stroke suspected. Generalized weakness. EXAM: CT HEAD WITHOUT CONTRAST TECHNIQUE: Contiguous axial images were obtained from the base of the skull through the vertex without intravenous contrast. RADIATION DOSE REDUCTION: This exam was performed according to the departmental dose-optimization program which includes automated exposure control, adjustment of the mA and/or kV according to patient size and/or use of iterative reconstruction technique. COMPARISON:  CT head 07/24/2021 FINDINGS: Brain: There is no evidence of acute intracranial hemorrhage, mass lesion, brain edema or extra-axial fluid collection. The ventricles and subarachnoid spaces are appropriately sized for age. There is no CT evidence of acute cortical infarction. Vascular:  No hyperdense vessel identified. Skull: Negative for fracture or focal lesion. Sinuses/Orbits: The visualized paranasal sinuses and mastoid air cells are clear. No orbital abnormalities are seen. Other: Small ossific density in the right supraorbital scalp (image 39/4) is unchanged. Probable sebaceous cyst anterior to the left ear (image 5/3), unchanged. IMPRESSION: Stable head CT without acute intracranial findings. No CT evidence of acute stroke. Electronically Signed   By: Carey Bullocks M.D.   On: 01/09/2022 17:55   VAS [place] UPPER EXTREMITY VENOUS DUPLEX  Result Date: 01/09/2022 UPPER VENOUS STUDY  Patient Name:  HALIL RENTZ  Date of Exam:   01/09/2022 Medical Rec #: [id number]            Accession #:    [id number] Date of Birth: 17-Feb-1986           Patient Gender: M Patient Age:   23 years Exam Location:  [facility] Procedure:      VAS [place] UPPER  EXTREMITY VENOUS DUPLEX Referring Phys: Janit Pagan --------------------------------------------------------------------------------  Indications: Pain, and Swelling Limitations: Patient movement. Comparison Study: No previous exam noted. Performing Technologist: Magdalene River BS, RVT  Examination Guidelines: A complete evaluation includes B-mode imaging, spectral Doppler, color Doppler, and power Doppler as needed of all accessible portions of each vessel. Bilateral testing is considered an integral  part of a complete examination. Limited examinations for reoccurring indications may be performed as noted.  Right Findings: +----------+------------+---------+-----------+----------+-------+ RIGHT     CompressiblePhasicitySpontaneousPropertiesSummary +----------+------------+---------+-----------+----------+-------+ Subclavian    Full       Yes       Yes                      +----------+------------+---------+-----------+----------+-------+  Left Findings: +----------+------------+---------+-----------+----------+--------------+ LEFT      CompressiblePhasicitySpontaneousProperties   Summary     +----------+------------+---------+-----------+----------+--------------+ IJV           Full       Yes       Yes                             +----------+------------+---------+-----------+----------+--------------+ Subclavian    Full       Yes       Yes                             +----------+------------+---------+-----------+----------+--------------+ Axillary      Full       Yes       Yes                             +----------+------------+---------+-----------+----------+--------------+ Brachial      Full                                                 +----------+------------+---------+-----------+----------+--------------+ Radial        Full                                                 +----------+------------+---------+-----------+----------+--------------+  Ulnar         Full                                                 +----------+------------+---------+-----------+----------+--------------+ Cephalic      Full                                                 +----------+------------+---------+-----------+----------+--------------+ Basilic                                             Not visualized +----------+------------+---------+-----------+----------+--------------+ The left basilic vein was not visualized.  Incidental finding of a non-vascularized, mostly anechoic mass noted in the shoulder area and extending into the upper arm.  Summary:  Right: No evidence of thrombosis in the subclavian.  Left: No evidence of deep vein thrombosis in the upper extremity. No evidence of superficial vein thrombosis in the upper extremity.  *See table(s) above for measurements and observations.    Preliminary     Assessment/Plan 36 year old male with multiple medical co-morbidities  including, but not limited to, acute right MCA stroke, endocarditis of prosthetic mitral valve, and heart failure with reduced ejection fraction on oral anticoagulation who has a left axillary abscess.  Given his acute on chronic heart failure and need for anticoagulation I think placing this patient under a general anesthetic for incision and drainage of his axilla would be high risk and ill advised.  I will review with my attending as well as cardiology.  Continue broad-spectrum antibiotics.  If anticoagulation can be held temporarily, attempted bedside incision and drainage is possible, would be concerned about subtotal drainage in this setting.    I reviewed nursing notes, Consultant cardiology, ID, family medicine notes, hospitalist notes, last 24 h vitals and pain scores, last 48 h intake and output, last 24 h labs and trends, and last 24 h imaging results.  Adam Phenix, PA-C [facility] 01/10/2022, 1:22 PM Please see Amion for pager number  during day hours 7:00am-4:30pm or 7:00am -11:30am on weekends

## 2022-01-10 NOTE — Progress Notes (Addendum)
Advanced Heart Failure Rounding Note   Subjective:   7/19 Extubated. Diuresed with IV lasix. I/O not accurate. Norepi weaned off.  7/20 Swan out  7/23 Milrinone off 7/27 Acute LUE weakness. CODE Stroke. MRI w/ several small acute right MCA territory ischemic strokes, suggestive of a cardioembolic etiology.   Neurology now following.   ID following. On vanc/cefepime for pMVR endocarditis. BCx NG. Remains afebrile.  Has PICC for continuation of home IV abx.   BPs 130s/90s  Unable to lift LUE. No other deficits. Denies dyspnea. INR 2.0. Mother present at bedside.   Objective:     Vital Signs:   Temp:  [97.7 F (36.5 C)-97.9 F (36.6 C)] 97.9 F (36.6 C) (07/28 0748) Pulse Rate:  [74-78] 76 (07/28 0748) Resp:  [14-20] 14 (07/28 0748) BP: (125-135)/(83-100) 135/95 (07/28 0748) SpO2:  [96 %-99 %] 99 % (07/28 0748) Weight:  [87 kg] 87 kg (07/28 0424) Last BM Date : 01/07/22  Weight change: Filed Weights   01/07/22 2233 01/08/22 0500 01/10/22 0424  Weight: 89 kg 89 kg 87 kg    Intake/Output:   Intake/Output Summary (Last 24 hours) at 01/10/2022 0801 Last data filed at 01/10/2022 0428 Gross per 24 hour  Intake --  Output 1070 ml  Net -1070 ml   PHYSICAL EXAM: General:  depressed appearing. No distress. No dyspnea  HEENT: normal Neck: supple. no JVD. Carotids 2+ bilat; no bruits.  Cor: PMI nondisplaced. Regular rate & rhythm. No rubs, gallops or murmurs. Lungs: clear Abdomen: soft, nontender, nondistended.  Extremities: no cyanosis, clubbing, rash, edema, + RUE PICC, decreased strength LUE  Neuro: alert & orientedx3, +LUE weakness. Affect pleasant  Telemetry: SR 80s (personally reviewed)   Labs: Basic Metabolic Panel: Recent Labs  Lab 01/05/22 0342 01/06/22 0409 01/07/22 0357 01/08/22 0525 01/09/22 0624 01/10/22 0505  NA 137 134* 132* 132* 133* 130*  K 4.6 4.3 4.2 4.2 4.5 3.9  CL 104 104 102 103 103 102  CO2 28 25 24 24 23 22   GLUCOSE 106* 86 109*  86 83 120*  BUN 17 17 22* 18 21* 13  CREATININE 0.74 0.75 0.84 0.84 0.82 0.77  CALCIUM 8.5* 8.6* 8.7* 9.1 9.1 9.0  MG 1.6* 1.7 1.7 1.7 1.7 1.6*  PHOS 3.3  --   --   --   --   --     Liver Function Tests: Recent Labs  Lab 01/04/22 0323 01/06/22 0409 01/07/22 0357 01/08/22 0525  AST 910* 407* 317* 240*  ALT 826* 498* 375* 294*  ALKPHOS 143* 115 128* 129*  BILITOT 1.0 1.0 1.0 1.0  PROT 7.3 7.6 8.4* 8.7*  ALBUMIN 2.0* 2.2* 2.4* 2.5*   No results for input(s): "LIPASE", "AMYLASE" in the last 168 hours. No results for input(s): "AMMONIA" in the last 168 hours.   CBC: Recent Labs  Lab 01/06/22 0409 01/07/22 0357 01/08/22 0525 01/09/22 0624 01/10/22 0505  WBC 9.1 9.5 8.6 8.0 7.5  HGB 8.9* 9.7* 9.9* 10.0* 10.2*  HCT 28.2* 30.4* 30.9* 31.3* 31.4*  MCV 82.0 82.6 81.7 82.2 81.1  PLT 303 319 357 397 380    Cardiac Enzymes: Recent Labs  Lab 01/10/22 0505  CKTOTAL 180    BNP: BNP (last 3 results) Recent Labs    07/24/21 1433 10/11/21 1230 12/27/21 1451  BNP 3,184.1* 400.8* >4,500.0*    ProBNP (last 3 results) No results for input(s): "PROBNP" in the last 8760 hours.    Other results:  Imaging: MR BRAIN WO  CONTRAST  Result Date: 01/09/2022 CLINICAL DATA:  Initial evaluation for neuro deficit, stroke suspected. EXAM: MRI HEAD WITHOUT CONTRAST TECHNIQUE: Multiplanar, multiecho pulse sequences of the brain and surrounding structures were obtained without intravenous contrast. COMPARISON:  Head CT from earlier the same day. FINDINGS: Brain: Cerebral volume within normal limits. Mild hazy and patchy T2/FLAIR signal intensity seen involving the periventricular and deep white matter both cerebral hemispheres, nonspecific, but most characteristic of chronic small vessel ischemic disease, advanced for age. Ischemia. Small remote right cerebellar infarct noted. Patchy small volume foci of restricted diffusion seen involving the cortical subcortical right frontoparietal  region (series 15, images 85, 84, 81), consistent with small acute ischemic infarcts. These are likely embolic in nature. No associated hemorrhage or mass effect. No other evidence for acute or subacute ischemia. No acute intracranial hemorrhage. Multiple scattered chronic micro hemorrhages noted involving the cerebellum and both cerebral hemispheres, nonspecific, but favored to be hypertensive in nature. No mass lesion, midline shift or mass effect. No hydrocephalus or extra-axial fluid collection. Pituitary gland and suprasellar region within normal limits. Vascular: Major intracranial vascular flow voids are maintained. Skull and upper cervical spine: Craniocervical junction within normal limits. Bone marrow signal intensity diffusely decreased on T1 weighted sequence, nonspecific, but most commonly related to anemia, smoking or obesity. No focal marrow replacing lesion. Few small cystic lesions measuring up to 1.8 cm seen within the visualized left temporal and pre-auricular region, nonspecific, but likely sebaceous cyst (series 27, images 10, 6). Sinuses/Orbits: Globes and orbital soft tissues within normal limits. Paranasal sinuses are largely clear. Small right mastoid effusion, of doubtful significance. Other: None. IMPRESSION: 1. Patchy small volume acute ischemic nonhemorrhagic cortical infarcts involving the subcortical right frontoparietal region. These are likely embolic in nature. 2. Underlying mild chronic microvascular ischemic disease, advanced for age. Small remote right cerebellar infarct. 3. Multiple scattered chronic micro hemorrhages involving the cerebellum and both cerebral hemispheres, nonspecific, but favored to be secondary to poorly controlled hypertension. Electronically Signed   By: Jeannine Boga M.D.   On: 01/09/2022 23:42   CT HEAD WO CONTRAST (5MM)  Result Date: 01/09/2022 CLINICAL DATA:  Neuro deficit, acute stroke suspected. Generalized weakness. EXAM: CT HEAD WITHOUT  CONTRAST TECHNIQUE: Contiguous axial images were obtained from the base of the skull through the vertex without intravenous contrast. RADIATION DOSE REDUCTION: This exam was performed according to the departmental dose-optimization program which includes automated exposure control, adjustment of the mA and/or kV according to patient size and/or use of iterative reconstruction technique. COMPARISON:  CT head 07/24/2021 FINDINGS: Brain: There is no evidence of acute intracranial hemorrhage, mass lesion, brain edema or extra-axial fluid collection. The ventricles and subarachnoid spaces are appropriately sized for age. There is no CT evidence of acute cortical infarction. Vascular:  No hyperdense vessel identified. Skull: Negative for fracture or focal lesion. Sinuses/Orbits: The visualized paranasal sinuses and mastoid air cells are clear. No orbital abnormalities are seen. Other: Small ossific density in the right supraorbital scalp (image 39/4) is unchanged. Probable sebaceous cyst anterior to the left ear (image 5/3), unchanged. IMPRESSION: Stable head CT without acute intracranial findings. No CT evidence of acute stroke. Electronically Signed   By: Richardean Sale M.D.   On: 01/09/2022 17:55   VAS [place] UPPER EXTREMITY VENOUS DUPLEX  Result Date: 01/09/2022 UPPER VENOUS STUDY  Patient Name:  Donald [name]  Date of Exam:   01/09/2022 Medical Rec #: [id number]            Accession #:  [id number] Date of Birth: 09/19/1985           Patient Gender: M Patient Age:   74 years Exam Location:  [facility] Procedure:      VAS [place] UPPER EXTREMITY VENOUS DUPLEX Referring Phys: Janit Pagan --------------------------------------------------------------------------------  Indications: Pain, and Swelling Limitations: Patient movement. Comparison Study: No previous exam noted. Performing Technologist: Magdalene River BS, RVT  Examination Guidelines: A complete evaluation includes B-mode imaging, spectral Doppler,  color Doppler, and power Doppler as needed of all accessible portions of each vessel. Bilateral testing is considered an integral part of a complete examination. Limited examinations for reoccurring indications may be performed as noted.  Right Findings: +----------+------------+---------+-----------+----------+-------+ RIGHT     CompressiblePhasicitySpontaneousPropertiesSummary +----------+------------+---------+-----------+----------+-------+ Subclavian    Full       Yes       Yes                      +----------+------------+---------+-----------+----------+-------+  Left Findings: +----------+------------+---------+-----------+----------+--------------+ LEFT      CompressiblePhasicitySpontaneousProperties   Summary     +----------+------------+---------+-----------+----------+--------------+ IJV           Full       Yes       Yes                             +----------+------------+---------+-----------+----------+--------------+ Subclavian    Full       Yes       Yes                             +----------+------------+---------+-----------+----------+--------------+ Axillary      Full       Yes       Yes                             +----------+------------+---------+-----------+----------+--------------+ Brachial      Full                                                 +----------+------------+---------+-----------+----------+--------------+ Radial        Full                                                 +----------+------------+---------+-----------+----------+--------------+ Ulnar         Full                                                 +----------+------------+---------+-----------+----------+--------------+ Cephalic      Full                                                 +----------+------------+---------+-----------+----------+--------------+ Basilic  Not visualized  +----------+------------+---------+-----------+----------+--------------+ The left basilic vein was not visualized.  Incidental finding of a non-vascularized, mostly anechoic mass noted in the shoulder area and extending into the upper arm.  Summary:  Right: No evidence of thrombosis in the subclavian.  Left: No evidence of deep vein thrombosis in the upper extremity. No evidence of superficial vein thrombosis in the upper extremity.  *See table(s) above for measurements and observations.    Preliminary      Medications:     Scheduled Medications:  amiodarone  200 mg Oral Daily   aspirin  81 mg Oral Daily   Chlorhexidine Gluconate Cloth  6 each Topical Daily   dapagliflozin propanediol  10 mg Oral Daily   digoxin  0.125 mg Oral Daily   enoxaparin (LOVENOX) injection  80 mg Subcutaneous Q12H   feeding supplement  237 mL Oral TID BM   midodrine  2.5 mg Oral TID WC   sodium chloride flush  10-40 mL Intracatheter Q12H   Warfarin - Pharmacist Dosing Inpatient   Does not apply q1600    Infusions:  sodium chloride 10 mL/hr at 01/06/22 0901   sodium chloride 10 mL/hr at 01/07/22 2200   ceFEPime (MAXIPIME) IV 2 g (01/10/22 0505)   vancomycin 750 mg (01/09/22 2316)    PRN Medications: sodium chloride, sodium chloride, ipratropium-albuterol, mouth rinse, oxyCODONE, sodium chloride flush   Assessment/Plan:   1. Mixed cardiogenic/septic shock - echo 12/28/21 LVEF 10% RV sev HK - TEE 7/15 severe biventricular failure. + vegetation on mechanical MVR - Bcx - NGTD - Respiratory culture w/ moderate WBCs (mononuclear and PMN) and no organisms  - Cultures no growth.  - ID following. Continue vanc/cefepime. Now AF - Off inotropes/pressors. MAPs stable on midodrine 5 TID.  2. 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 Repeat echo as above.  - Echo this admit LVEF  10% RV severely HK.  - Off milrinone and NE. - Evuolemic on exam. Hold lasix again today.  - Continue Farxiga 10 mg daily - Continue digoxin 0.125, dig level 0.5. - Decrease midodrine to 2.5 mg TID. If BP stable at follow-up can stop and further titrate GDMT. - Long-term only option is transplant. Dr. Haroldine Laws discussed with [facility] to see if he would be a candidate in setting of pyoderma gangrenosum. They will discuss.    3. 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  - Underwent MVR with mechanical mitral valve in 05/2021 at [facility].  - TEE 7/15 severe biventricular failure. + vegetation on mechanical MVR - Bcx NGTD. -> on vanc/cefepime. 6 weeks IV abx, end date 08/24.  - Has PICC for home abx  - ID following. Appreciate assistance. - INR 2.0  Continue warfarin. Bridging with Lovenox until INR therapeutic (goal 2.5-3.5). He is willing to continue Lovenox at home until INR at goal.   4. Acute hypoxic respiratory failure - intubated 7/15 in setting of shock - Extubated 719  - O2 stable on RA. Resolved.  5. Paroxysmal Atrial Fibrillation - In SR  - does not tolerate AF - liver enzymes continue to improve. - back on amio 200 daily  5. AKI - Due to ATN/shock - Baseline creatinine 0.98 - 1.0.  - Peak 2.6. Resolved.  6. Shock liver - continue supportive care - AST /ALT continue trend down.   7. Autoimmune Disorder  - He has seronegative spondylitis and pyoderma gangrenosum. Had been on Humira. - Followed by Rheumatology as an outpatient.   8. Hypomagnesemia/hypokalemia - Mag 1.6. Give 4 gm mag IV again today. - K 4.5  9. Deconditioning - PT/OT following. Will need outpatient PT/OT post stroke. - Continue to mobilize.   10. Multiple small acute right MCA territory ischemic strokes - LUE weakness  -  likely embolic from endocarditis  - neurology consulted   Stable from HF perspective. Home once cleared by primary team and neurology.   INR 2.0 today. Will need Lovenox bridge. See PharD note regarding dose instructions. Has INR check 07/31.   HF medications at discharge: Aspirin 81 mg daily Amiodarone 200 mg daily Farxiga 10 mg daily Digoxin 0.125 mg daily Midodrine 2.5 mg TID Lasix 40 mg daily Potassium chloride 40 mEQ daily Do not resume losartan or spiro  IV Vancomycin and Cefepime per ID orders.  Has f/u in HF clinic scheduled.   Length of Stay: [address], PA-C  8:01 AM   01/10/2022, 8:01 AM  Advanced Heart Failure Team Pager [phone] (M-F; 7a - 4p)  Please contact [facility] for night-coverage after hours (4p -7a ) and weekends on amion.   Patient seen and examined with the above-signed Advanced Practice Provider and/or Housestaff. I personally reviewed laboratory data, imaging studies and relevant notes. I independently examined the patient and formulated the important aspects of the plan. I have edited the note to reflect any of my changes or salient points. I have personally discussed the plan with the patient and/or family.  Developed LUE weakness yesterday. Brain MRI with evidence of embolic CVA. Remains in NSR. On lovenox and IV abx  General:  Well appearing. No resp difficulty HEENT: normal Neck: supple. no JVD. Carotids 2+ bilat; no bruits. No lymphadenopathy or thryomegaly appreciated. Cor: PMI nondisplaced. Regular rate & rhythm. No rubs, gallops or murmurs. + s2 Lungs: clear Abdomen: soft, nontender, nondistended. No hepatosplenomegaly. No bruits or masses. Good bowel sounds. Extremities: no cyanosis, clubbing, rash, edema LUE large tender mass under left axilla Neuro: alert & orientedx3, cranial nerves grossly intact. LUe swollen tender and weak Affect pleasant  Stable from cardiac perspective. Suspect CVA cardio-embolic likely from  MV vegetation but also possible PAF or LV micro-emboli. Continue AC. Appreciate Neuro management.   U/s showed large anechoic mass in LUE. ? Cyst with partial hemorrhage? Abscess?  Probably not related to CVA. CT LUE pending. May need to hold [facility] for a brief time. Likely will need surgical eval.    HF team will follow at a distance. Please call with questions.   Arvilla Meres, MD  8:42 AM

## 2022-01-10 NOTE — Assessment & Plan Note (Addendum)
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  Surgery consulted, attempted bedside aspiration without success, decline further surgical management at this time given high risk on anticoagulation. IR following, aspiration completed, GS negative, no organisms, cultures pending -F/u IR recs, INR -Continue broad spectrum abx as above, discussed regimen with ID 7/30 - LUE decreased ROM, pulse, and sensation - MRI humerus with IV contrast: cellulitis and pyomyositis + hemorrhagic component vs large hematoma c/f soft-tissue malignancy - planned transfer to [facility] for orthopedic oncology

## 2022-01-10 NOTE — Progress Notes (Addendum)
ANTICOAGULATION CONSULT NOTE  Pharmacy Consult for enoxaparin>warfarin Indication:  MVR /AFib  No Known Allergies  Patient Measurements: Height: 5\' 10"  (177.8 cm) Weight: 87 kg (191 lb 12.8 oz) IBW/kg (Calculated) : 73 Heparin Dosing Weight: 90.7 kg  Vital Signs: Temp: 97.9 F (36.6 C) (07/28 0748) Temp Source: Oral (07/28 0748) BP: 135/95 (07/28 0748) Pulse Rate: 76 (07/28 0748)  Labs: Recent Labs    01/08/22 0525 01/09/22 0624 01/10/22 0505  HGB 9.9* 10.0* 10.2*  HCT 30.9* 31.3* 31.4*  PLT 357 397 380  LABPROT 17.0* 20.1* 22.5*  INR 1.4* 1.7* 2.0*  HEPARINUNFRC 0.55  --   --   CREATININE 0.84 0.82 0.77  CKTOTAL  --   --  180     Estimated Creatinine Clearance: 135.6 mL/min (by C-G formula based on SCr of 0.77 mg/dL).   Medical History: Past Medical History:  Diagnosis Date   Anemia    Autoimmune disorder (HCC)    pyoderma gangrenosum   CHF (congestive heart failure) (HCC)    Chronic systolic heart failure (HCC)    a. EF 35-40% by echo in 07/2018 b. EF at 45% by repeat echo in 04/2020   DVT (deep venous thrombosis) (HCC)    h/o   Dysrhythmia    Mitral regurgitation    a. 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 b. 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HCC)    arthritis   Tricuspid regurgitation    Assessment: 55 yom with a history of mitral regurgitation s/p mechanical valve replacement on Warfarin, HF, AF and prior DVT, and Hx of hidradenitis and pyoderma gangrenosum .   Patient taking warfarin PTA 4mg  daily per last AC note.  Patient changed to full dose lovenox 7/26. INR trending up to 2 today, he has had much higher requirements during hospital stay. Patient was set for discharge yesterday but then had complications  including possible pyomyositis in arm and possible stroke. Will continue previous plan of 6mg  tonight of warfarin.   Patient continues on broad antibiotics for non speciated endocarditis.   Goal of Therapy:  INR goal 2.5-3.5 Anti Xa level 0.6-1.2 drawn 4hr after dose as needed Monitor platelets by anticoagulation protocol: Yes   Plan:  Continue enoxaparin 80mg  q12h Warfarin 6mg  today Will continue to follow plan need for anticoagulation to be held for further procedures  DC planning with enoxaparin 1mg /kg = 80mg  sq q12h bridge until INR at goal.   6mg  on 7/28 then 4mg  on 7/29-7/30 Enoxaparin $4   PharmD., BCPS Clinical Pharmacist 01/10/2022 8:44 AM

## 2022-01-10 NOTE — TOC CAGE-AID Note (Signed)
Transition of Care (TOC) - CAGE-AID Screening   Patient Details  Name: Donald [name] MRN: [id number] Date of Birth: 09/02/1985  Transition of Care (TOC) CM/SW Contact:    Jessica  Smith, LCSWA Phone Number: 01/10/2022, 11:03 AM   Clinical Narrative: CSW met with pt at bedside to complete CAGE- AID assessment. Pt denies any drug or alcohol use, no resources needed.   CAGE-AID Screening:    Have You Ever Felt You Ought to Cut Down on Your Drinking or Drug Use?: No Have People Annoyed You By Critizing Your Drinking Or Drug Use?: No Have You Felt Bad Or Guilty About Your Drinking Or Drug Use?: No Have You Ever Had a Drink or Used Drugs First Thing In The Morning to Steady Your Nerves or to Get Rid of a Hangover?: No CAGE-AID Score: 0  Substance Abuse Education Offered: No          

## 2022-01-10 NOTE — Progress Notes (Addendum)
Discussed case with on-call neurologist Dr. Otelia Limes given MRI findings with some acute strokes that most likely seemed embolic. The location of the small strokes could lend to weakness, but it would not be overly profound. Given their size, they are low risk for hemorrhagic conversion, but will allow permissive HTN up to a systolic measurement of 180. Patient is currently on ASA and Warfarin, no changes to that at this time.   Stroke team to follow per discussion.   Donald Guerrieri, DO     Addendum 1:04 AM Went to patient room to discuss the results, but patient and mother were sleeping soundly. Deferred discussion of the results until they awaken.   Donald Manzo, DO

## 2022-01-10 NOTE — Progress Notes (Signed)
Daily Progress Note Intern Pager: [phone]  Patient name: Donald [name] Medical record number: [id number] Date of birth: 1985-11-01 Age: 36 y.o. Gender: male  Primary Care Provider: No primary care provider on file. Consultants: PCCM, ID, cardiology, pharmacy Code Status: Full   Pt Overview and Major Events to Date:  -7/14 admitted FPTS for sepsis, N/V. Started on flagyl, cefepime, vanc -7/15 worsening shock> ICU> intubated, pressors, abx. Flagyl stopped -7/16 decreasing milrinone for high CO and co-ox -7/19 extubated -7/21 drop in cardiac index, Levophed restarted, Swan discontinued -7/22 levophed stopped -7/23 milrinone stopped -7/24 out to FMTS -7/27 acute stroke with swelling of L upper arm, CT w/ pyomyositis   Assessment and Plan:   Donald [name] is a 36 y.o. male who presented with nausea, vomiting, and diarrhea and found to have cardiogenic and septic shock with MV endocarditis. Pertinent PMH/PSH includes MR s/p MV mechanical valve on warfarin, biventricular HF, and PAF.   Acute embolic stroke [facility]) MRI with findings of multiple small embolic strokes in subcortical right frontoparietal region as well as a small remote right cerebellar infarct. Likely 2/2 acute vegetation from endocarditis that was present on admission. Numbness, tingling, weakness in L arm is likely explained by acute stroke.  -Continue lovenox bridge to warfarin, ASA administration -F/u stroke team to see today   Left arm pain and swelling L upper arm swollen and painful to palpation. Duplex US prelim negative for DVT but showed undifferentiated mass thought not to be vascular. On anticoag. Pain and swelling are persistent. CT with findings suspicious for pyomyositis. GS consulted, recommended contacting orthopedics. 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 Bcx on admission with no growth, though only 2 bottles obtained. Vegetation likely cause of acute stroke overnight (7/27). -Continue vanc 750 mg Q8, cefepime 2 g Q8 until August 27th, 2023 through newly-placed PICC -Continue lovenox bridge until INR therapeutic 2.5-3.5 -Cardiology following, appreciate recs  Hypomagnesemia Mg 1.6 this morning. S/p 2 mg IV repletion. -AM Mg  Hypercholesterolemia Patient with chol 217 and LDL 163. Given history of cardiovascular events, will need statin for secondary prevention. -Start Crestor 20 mg daily  Shock, septic and cardiogenic Resolved since out of ICU. On vanc, cefepime, midodrine. -Monitor VS -Continue abx per endocarditis  Paroxysmal atrial fibrillation (HCC) Stable. On amiodarone 200 mg daily. -Cardiology following, appreciate recs -Monitor rate/rhythm control -LFTs down trending, can continue med  Chronic heart failure (HCC) Stable. Echo with biventricular failure, EF this admission <20%. Weight downtrending over admission with lasix 40 mg PO. On digoxin 0.125 mg daily, farxiga 10 mg daily. -F/u cardiology recs, lasix administration, unsure of transplant candidacy -Continue digoxin, farxiga -Monitor fluid overload  Microcytic anemia Chronic issues. Hgb at baseline.  -Monitor clinical status  FEN/GI: Thin diet, KVO NS fluids PPx: Heparin infusion Dispo:Home likely today with lovenox bridge, PICC abx  Subjective:  Feeling well this morning. Still having difficulty moving his L arm. Family in the room recounting overnight events as above. Questions answered.   Objective: Temp:  [97.7 F (36.5 C)-97.9 F (36.6 C)] 97.9 F (36.6 C) (07/28 0748) Pulse Rate:  [74-78] 76 (07/28 0748) Resp:  [14-20] 14 (07/28 0748) BP: (125-135)/(83-100) 135/95 (07/28 0748) SpO2:  [96 %-99 %] 99 % (07/28 0748) Weight:  [87 kg] 87 kg (07/28 0424) Physical Exam: General: Lying in bed,  in NAD,  alert and oriented Cardiovascular: Regular rate Respiratory: Breathing and speaking comfortably on RA Abdomen: Nondistended MSK: Swelling of upper L arm without erythema or excessive warmth, presence of thinning skin on medial side near axilla potentially from IV use,  NEU: Lifts L arm to approximately 80 degrees though shows slow movements, overall strength 4/5 Extremities: Warm, dry  Laboratory: Most recent CBC Lab Results  Component Value Date   WBC 7.5 01/10/2022   HGB 10.2 (L) 01/10/2022   HCT 31.4 (L) 01/10/2022   MCV 81.1 01/10/2022   PLT 380 01/10/2022   Most recent BMP    Latest Ref Rng & Units 01/10/2022    5:05 AM  BMP  Glucose 70 - 99 mg/dL 962   BUN 6 - 20 mg/dL 13   Creatinine 9.52 - 1.24 mg/dL 8.41   Sodium 324 - 401 mmol/L 130   Potassium 3.5 - 5.1 mmol/L 3.9   Chloride 98 - 111 mmol/L 102   CO2 22 - 32 mmol/L 22   Calcium 8.9 - 10.3 mg/dL 9.0    INR 2 Mg 1.6 CK 180 Myoglobin pending  Imaging/Diagnostic Tests: MRI brain wo contrast IMPRESSION: 1. Patchy small volume acute ischemic nonhemorrhagic cortical infarcts involving the subcortical right frontoparietal region. These are likely embolic in nature. 2. Underlying mild chronic microvascular ischemic disease, advanced for age. Small remote right cerebellar infarct. 3. Multiple scattered chronic micro hemorrhages involving the cerebellum and both cerebral hemispheres, nonspecific, but favored to be secondary to poorly controlled hypertension.  CT head wo contrast IMPRESSION: Stable head CT without acute intracranial findings. No CT evidence of acute stroke.  ULE Duplex US Summary: Right:  No evidence of thrombosis in the subclavian.  Left:  No evidence of deep vein thrombosis in the upper extremity. No evidence of  superficial vein thrombosis in the upper extremity. Incidental finding of a non-vascularized, mostly anechoic mass noted in the shoulder area and extending into the upper arm.    Donald Holmes, MD 01/10/2022, 8:20 AM PGY-1, [facility] FPTS Intern pager: [phone], text pages welcome Secure chat group [facility] Teaching Service

## 2022-01-11 ENCOUNTER — Encounter (HOSPITAL_COMMUNITY): Payer: Self-pay | Admitting: Family Medicine

## 2022-01-11 DIAGNOSIS — J9601 Acute respiratory failure with hypoxia: Secondary | ICD-10-CM | POA: Diagnosis not present

## 2022-01-11 DIAGNOSIS — I38 Endocarditis, valve unspecified: Secondary | ICD-10-CM | POA: Diagnosis not present

## 2022-01-11 DIAGNOSIS — R570 Cardiogenic shock: Secondary | ICD-10-CM | POA: Diagnosis not present

## 2022-01-11 DIAGNOSIS — I480 Paroxysmal atrial fibrillation: Secondary | ICD-10-CM | POA: Diagnosis not present

## 2022-01-11 DIAGNOSIS — I5023 Acute on chronic systolic (congestive) heart failure: Secondary | ICD-10-CM | POA: Diagnosis not present

## 2022-01-11 DIAGNOSIS — I63411 Cerebral infarction due to embolism of right middle cerebral artery: Secondary | ICD-10-CM | POA: Diagnosis not present

## 2022-01-11 DIAGNOSIS — A419 Sepsis, unspecified organism: Secondary | ICD-10-CM | POA: Diagnosis not present

## 2022-01-11 DIAGNOSIS — R6521 Severe sepsis with septic shock: Secondary | ICD-10-CM | POA: Diagnosis not present

## 2022-01-11 DIAGNOSIS — G9341 Metabolic encephalopathy: Secondary | ICD-10-CM | POA: Diagnosis not present

## 2022-01-11 DIAGNOSIS — N170 Acute kidney failure with tubular necrosis: Secondary | ICD-10-CM | POA: Diagnosis not present

## 2022-01-11 DIAGNOSIS — K7200 Acute and subacute hepatic failure without coma: Secondary | ICD-10-CM | POA: Diagnosis not present

## 2022-01-11 DIAGNOSIS — Z20822 Contact with and (suspected) exposure to covid-19: Secondary | ICD-10-CM | POA: Diagnosis not present

## 2022-01-11 DIAGNOSIS — I5043 Acute on chronic combined systolic (congestive) and diastolic (congestive) heart failure: Secondary | ICD-10-CM | POA: Diagnosis not present

## 2022-01-11 DIAGNOSIS — N179 Acute kidney failure, unspecified: Secondary | ICD-10-CM | POA: Diagnosis not present

## 2022-01-11 DIAGNOSIS — L88 Pyoderma gangrenosum: Secondary | ICD-10-CM | POA: Diagnosis not present

## 2022-01-11 DIAGNOSIS — M79602 Pain in left arm: Secondary | ICD-10-CM | POA: Diagnosis not present

## 2022-01-11 DIAGNOSIS — Z7901 Long term (current) use of anticoagulants: Secondary | ICD-10-CM | POA: Diagnosis not present

## 2022-01-11 DIAGNOSIS — T826XXA Infection and inflammatory reaction due to cardiac valve prosthesis, initial encounter: Secondary | ICD-10-CM | POA: Diagnosis not present

## 2022-01-11 DIAGNOSIS — I330 Acute and subacute infective endocarditis: Secondary | ICD-10-CM | POA: Diagnosis not present

## 2022-01-11 LAB — CBC
HCT: 30.7 % — ABNORMAL LOW (ref 39.0–52.0)
Hemoglobin: 9.8 g/dL — ABNORMAL LOW (ref 13.0–17.0)
MCH: 26.2 pg (ref 26.0–34.0)
MCHC: 31.9 g/dL (ref 30.0–36.0)
MCV: 82.1 fL (ref 80.0–100.0)
Platelets: 365 10*3/uL (ref 150–400)
RBC: 3.74 MIL/uL — ABNORMAL LOW (ref 4.22–5.81)
RDW: 18.1 % — ABNORMAL HIGH (ref 11.5–15.5)
WBC: 7.7 10*3/uL (ref 4.0–10.5)
nRBC: 0 % (ref 0.0–0.2)

## 2022-01-11 LAB — MYOGLOBIN, SERUM: Myoglobin: 267 ng/mL — ABNORMAL HIGH (ref 28–72)

## 2022-01-11 LAB — BASIC METABOLIC PANEL
Anion gap: 8 (ref 5–15)
BUN: 11 mg/dL (ref 6–20)
CO2: 21 mmol/L — ABNORMAL LOW (ref 22–32)
Calcium: 9 mg/dL (ref 8.9–10.3)
Chloride: 99 mmol/L (ref 98–111)
Creatinine, Ser: 0.78 mg/dL (ref 0.61–1.24)
GFR, Estimated: >60 mL/min (ref >60)
Glucose, Bld: 107 mg/dL — ABNORMAL HIGH (ref 70–99)
Potassium: 4 mmol/L (ref 3.5–5.1)
Sodium: 128 mmol/L — ABNORMAL LOW (ref 135–145)

## 2022-01-11 LAB — PROTIME-INR
INR: 2.7 — ABNORMAL HIGH (ref 0.8–1.2)
Prothrombin Time: 28.6 seconds — ABNORMAL HIGH (ref 11.4–15.2)

## 2022-01-11 LAB — MAGNESIUM: Magnesium: 1.5 mg/dL — ABNORMAL LOW (ref 1.7–2.4)

## 2022-01-11 MED ORDER — MAGNESIUM SULFATE 2 GM/50ML IV SOLN
2.0000 g | Freq: Once | INTRAVENOUS | Status: AC
Start: 2022-01-11 — End: 2022-01-11
  Administered 2022-01-11: 2 g via INTRAVENOUS
  Filled 2022-01-11: qty 50

## 2022-01-11 MED ORDER — WARFARIN SODIUM 5 MG PO TABS
5.0000 mg | ORAL_TABLET | Freq: Once | ORAL | Status: AC
  Administered 2022-01-11: 5 mg via ORAL
  Filled 2022-01-11: qty 1

## 2022-01-11 MED ORDER — MAGNESIUM SULFATE 2 GM/50ML IV SOLN
2.0000 g | Freq: Once | INTRAVENOUS | Status: AC
  Administered 2022-01-11: 2 g via INTRAVENOUS
  Filled 2022-01-11: qty 50

## 2022-01-11 NOTE — Evaluation (Signed)
Speech Language Pathology Evaluation Patient Details Name: Donald [name] MRN: [id number] DOB: 1985/11/26 Today's Date: 01/11/2022 Time: 1720-1735 SLP Time Calculation (min) (ACUTE ONLY): 15 min  Problem List:  Patient Active Problem List   Diagnosis Date Noted   Acute embolic stroke (HCC) 01/10/2022   Left arm pain and swelling 01/10/2022   Cardiomyopathy (HCC)    Paroxysmal atrial fibrillation (HCC) 01/06/2022   Endocarditis of prosthetic mitral valve (HCC)    Acute febrile illness    Anticoagulated on Coumadin    Dehydration    Elevated lactic acid level    Nausea vomiting and diarrhea    Atrial fibrillation with RVR (HCC) 08/03/2021   Sacral wound 08/03/2021   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   Shock liver 08/03/2021   Upper GI bleed 08/03/2021   Microcytic anemia 08/03/2021   Hyponatremia 08/03/2021   Cardiogenic shock (HCC)    Acute hypoxemic respiratory failure (HCC) 07/24/2021   Encounter for central line placement    H/O mitral valve replacement 06/20/2021   Encounter for therapeutic drug monitoring 06/20/2021   Chronic heart failure (HCC) 05/30/2021   Acute respiratory failure with hypoxia (HCC) 05/30/2021   Hypokalemia 05/30/2021   Elevated brain natriuretic peptide (BNP) level 05/30/2021   Elevated troponin 05/30/2021   Elevated serum creatinine 05/30/2021   Hypoalbuminemia due to protein-calorie malnutrition (HCC) 05/30/2021   Prolonged QT interval 05/30/2021   Transaminitis 05/30/2021   Paroxysmal atrial flutter (HCC) 05/30/2021   Acute systolic CHF (congestive heart failure) (HCC) 05/30/2021   Acute on chronic systolic and diastolic heart failure, NYHA class 3 (HCC) 05/30/2021   Caries    Chronic periodontitis    Retained dental root    Tricuspid regurgitation    Mitral regurgitation    Acute on chronic HFrEF (heart failure with reduced ejection fraction) (HCC)    Autoimmune disorder (HCC)    Seronegative spondylitis (HCC)    Past Medical  History:  Past Medical History:  Diagnosis Date   Anemia    Autoimmune disorder (HCC)    pyoderma gangrenosum   CHF (congestive heart failure) (HCC)    Chronic systolic heart failure (HCC)    a. EF 35-40% by echo in 07/2018 b. EF at 45% by repeat echo in 04/2020   DVT (deep venous thrombosis) (HCC)    h/o   Dysrhythmia    Mitral regurgitation    a. 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 b. 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HCC)    arthritis   Shock, septic and cardiogenic 12/28/2021   Tricuspid regurgitation    Past Surgical History:  Past Surgical History:  Procedure Laterality Date   CARDIAC CATHETERIZATION     HERNIA REPAIR Right 2018   MITRAL VALVE REPAIR  06/07/2018   [facility] - Dr Meda Klinefelter   MITRAL VALVE REPLACEMENT N/A 06/23/2021   MULTIPLE EXTRACTIONS WITH ALVEOLOPLASTY N/A 11/08/2020   Procedure: EXTRACTION OF TEETH NUMBER ONE, FIFTHTEEN, SIXTEEN, SEVENTEEN, EIGHTTEEN AND THRTY WITH ALVEOLOPLASTY OF LOWER LEFT QUADRANT.;  Surgeon: Sharman Cheek, DMD;  Location: [facility] OR;  Service: Dentistry;  Laterality: N/A;   RIGHT/LEFT HEART CATH AND CORONARY ANGIOGRAPHY N/A 09/11/2020   Procedure: RIGHT/LEFT HEART CATH AND CORONARY ANGIOGRAPHY;  Surgeon: Dolores Patty, MD;  Location: [facility] INVASIVE CV LAB;  Service: Cardiovascular;  Laterality: N/A;   TEE WITHOUT CARDIOVERSION N/A 05/23/2020   Procedure: TRANSESOPHAGEAL ECHOCARDIOGRAM (TEE) WITH PROPOFOL;  Surgeon: [name], MD;  Location: [facility] ENDO SUITE;  Service: Endoscopy;  Laterality: N/A;   HPI:  36 year old male with who presented 7/14 with c/o of N/V/D, subjective fever for 2-3 days with poor PO intake and SOB but denied CP, palpitations, or abdominal pain.  Transferred to ICU 7/15 cardiogenic and septic shock , found to have prosthetic mitral valve endocarditis on TEE, EF less than 20%. Pt prior hx of MR s/p MVR w/ mechanical valve on coumadin, Biventricular HF, and PAF.On 01/09/22 he sufferered CVA with MRI brain showing multiple small embolic strokes in subcortical right frontoparietal region as well as a small remote right cerebellar infarct.   Assessment / Plan / Recommendation Clinical Impression  Patient not currently presenting with any significant cognitive-linguistic or speech impairments as per this evaluation. No oral-motor weakness or numbness, speech was 100% intelligible. Patient was able to describe medical and therapeutic interventions without difficulty and patient without c/o difficulty in focusing/concentrating. Memory/delayed recall appeare in tact. SLP not recommending any further interventions at this time.    SLP Assessment  SLP Recommendation/Assessment: Patient does not need any further Speech Lanaguage Pathology Services SLP Visit Diagnosis: Cognitive communication deficit (R41.841)    Recommendations for follow up therapy are one component of a multi-disciplinary discharge planning process, led by the attending physician.  Recommendations may be updated based on patient status, additional functional criteria and insurance authorization.    Follow Up Recommendations  No SLP follow up    Assistance Recommended at Discharge  None  Functional Status Assessment Patient has not had a recent decline in their functional status  Frequency and Duration   N/A        SLP Evaluation Cognition  Overall Cognitive Status: Within Functional Limits for tasks assessed Arousal/Alertness: Awake/alert Orientation Level: Oriented X4 Year: 2023 Month: July Day of Week: Correct Attention: Sustained Sustained Attention: Appears intact Memory: Appears intact Awareness: Appears intact Problem Solving: Appears intact Safety/Judgment:  Appears intact       Comprehension  Auditory Comprehension Overall Auditory Comprehension: Appears within functional limits for tasks assessed    Expression Expression Primary Mode of Expression: Verbal Verbal Expression Overall Verbal Expression: Appears within functional limits for tasks assessed Initiation: No impairment Repetition: No impairment Naming: No impairment Pragmatics: No impairment Non-Verbal Means of Communication: Not applicable   Oral / Motor  Oral Motor/Sensory Function Overall Oral Motor/Sensory Function: Within functional limits            Angela Nevin, MA, CCC-SLP Speech Therapy

## 2022-01-11 NOTE — Progress Notes (Signed)
Subjective: CC: L arm stable. Notes pain near the axilla and upper inner arm. Stable swelling, no bruising. Able to move the arm but feels weak.   Objective: Vital signs in last 24 hours: Temp:  [98.1 F (36.7 C)-98.7 F (37.1 C)] 98.5 F (36.9 C) (07/29 1122) Pulse Rate:  [75-80] 75 (07/29 1122) Resp:  [12-20] 20 (07/29 1122) BP: (114-127)/(84-98) 123/88 (07/29 1122) SpO2:  [97 %-98 %] 98 % (07/29 1122) Weight:  [87.4 kg] 87.4 kg (07/29 0358) Last BM Date : 01/08/22  Intake/Output from previous day: 07/28 0701 - 07/29 0700 In: -  Out: 1700 [Urine:1700] Intake/Output this shift: Total I/O In: -  Out: 350 [Urine:350]  PE: Gen:  Alert, NAD, pleasant Msk: There is induration in the left axilla along with swelling of the left upper arm that is soft. No bruising. No drainage. Left arm weakness present but demonstrates able flex/ext elbow and wrist. Fingers arm wwp.   Lab Results:  Recent Labs    01/10/22 0505 01/11/22 0452  WBC 7.5 7.7  HGB 10.2* 9.8*  HCT 31.4* 30.7*  PLT 380 365   BMET Recent Labs    01/10/22 0505 01/11/22 0452  NA 130* 128*  K 3.9 4.0  CL 102 99  CO2 22 21*  GLUCOSE 120* 107*  BUN 13 11  CREATININE 0.77 0.78  CALCIUM 9.0 9.0   PT/INR Recent Labs    01/10/22 0505 01/11/22 0452  LABPROT 22.5* 28.6*  INR 2.0* 2.7*   CMP     Component Value Date/Time   NA 128 (L) 01/11/2022 0452   K 4.0 01/11/2022 0452   CL 99 01/11/2022 0452   CO2 21 (L) 01/11/2022 0452   GLUCOSE 107 (H) 01/11/2022 0452   BUN 11 01/11/2022 0452   CREATININE 0.78 01/11/2022 0452   CALCIUM 9.0 01/11/2022 0452   PROT 8.7 (H) 01/08/2022 0525   ALBUMIN 2.5 (L) 01/08/2022 0525   AST 240 (H) 01/08/2022 0525   ALT 294 (H) 01/08/2022 0525   ALKPHOS 129 (H) 01/08/2022 0525   BILITOT 1.0 01/08/2022 0525   GFRNONAA >60 01/11/2022 0452   GFRAA >60 01/09/2015 1630   Lipase     Component Value Date/Time   LIPASE 31 07/22/2021 0248    Studies/Results: VAS  [place] UPPER EXTREMITY VENOUS DUPLEX  Result Date: 01/10/2022 UPPER VENOUS STUDY  Patient Name:  Donald [name]  Date of Exam:   01/09/2022 Medical Rec #: [id number]            Accession #:    [id number] Date of Birth: [birth year]           Patient Gender: M Patient Age:   10 years Exam Location:  [facility] Procedure:      VAS [place] UPPER EXTREMITY VENOUS DUPLEX Referring Phys: Andrena Mews --------------------------------------------------------------------------------  Indications: Pain, and Swelling Limitations: Patient inability to lie still and constant sitting upright. Comparison Study: No previous exam noted. Performing Technologist: Bobetta Lime BS, RVT  Examination Guidelines: A complete evaluation includes B-mode imaging, spectral Doppler, color Doppler, and power Doppler as needed of all accessible portions of each vessel. Bilateral testing is considered an integral part of a complete examination. Limited examinations for reoccurring indications may be performed as noted.  Right Findings: +----------+------------+---------+-----------+----------+-------+ RIGHT     CompressiblePhasicitySpontaneousPropertiesSummary +----------+------------+---------+-----------+----------+-------+ Subclavian    Full       Yes       Yes                      +----------+------------+---------+-----------+----------+-------+  Left Findings: +----------+------------+---------+-----------+----------+--------------+ LEFT      CompressiblePhasicitySpontaneousProperties   Summary     +----------+------------+---------+-----------+----------+--------------+ IJV           Full       Yes       Yes                             +----------+------------+---------+-----------+----------+--------------+ Subclavian    Full       Yes       Yes                             +----------+------------+---------+-----------+----------+--------------+ Axillary      Full       Yes       Yes                              +----------+------------+---------+-----------+----------+--------------+ Brachial      Full                                                 +----------+------------+---------+-----------+----------+--------------+ Radial        Full                                                 +----------+------------+---------+-----------+----------+--------------+ Ulnar         Full                                                 +----------+------------+---------+-----------+----------+--------------+ Cephalic      Full                                                 +----------+------------+---------+-----------+----------+--------------+ Basilic                                             Not visualized +----------+------------+---------+-----------+----------+--------------+ The left basilic vein was not visualized.  Incidental finding of a non-vascularized, mostly anechoic mass noted in the shoulder area and extending into the upper arm.  Summary:  Right: No evidence of thrombosis in the subclavian.  Left: No evidence of deep vein thrombosis in the upper extremity. No evidence of superficial vein thrombosis in the upper extremity.  *See table(s) above for measurements and observations.  Diagnosing physician: Jamelle Haring Electronically signed by Jamelle Haring on 01/10/2022 at 2:03:09 PM.    Final    CT ANGIO HEAD NECK W WO CM  Result Date: 01/10/2022 CLINICAL DATA:  Provided history: Stroke/TIA, determine embolic source. EXAM: CT ANGIOGRAPHY HEAD AND NECK TECHNIQUE: Multidetector CT imaging of the head and neck was performed using the standard protocol during bolus administration of intravenous contrast. Multiplanar CT image reconstructions and MIPs were obtained to evaluate the vascular anatomy. Carotid  stenosis measurements (when applicable) are obtained utilizing NASCET criteria, using the distal internal carotid diameter as the denominator. RADIATION DOSE  REDUCTION: This exam was performed according to the departmental dose-optimization program which includes automated exposure control, adjustment of the mA and/or kV according to patient size and/or use of iterative reconstruction technique. CONTRAST:  16mL OMNIPAQUE IOHEXOL 350 MG/ML SOLN COMPARISON:  Brain MRI 01/09/2022.  Head CT 01/09/2022. FINDINGS: CT HEAD FINDINGS Brain: No age advanced or lobar predominant parenchymal atrophy. Known small acute right frontoparietal lobe infarcts were better appreciated on the brain MRI of 01/09/2022. Background mild chronic small vessel ischemic changes within the cerebral white matter, also better appreciated on yesterday's MRI. Redemonstrated small chronic infarct within the right cerebellar hemisphere. Partially empty sella turcica, a nonspecific finding. There is no acute intracranial hemorrhage. No extra-axial fluid collection. No evidence of an intracranial mass. No midline shift. Vascular: No hyperdense vessel. Skull: No fracture or aggressive osseous lesion. Sinuses/Orbits: No mass or acute finding within the imaged orbits. No significant paranasal sinus disease. Other: Small-volume fluid within the right mastoid air cells. Review of the MIP images confirms the above findings CTA NECK FINDINGS Aortic arch: Common origin of the innominate and left common carotid arteries. The visualized aortic arch is normal in caliber. No hemodynamically significant innominate or proximal subclavian artery stenosis. Right carotid system: CCA and ICA patent within the neck without stenosis or significant atherosclerotic disease. Left carotid system: CCA and ICA patent within the neck without stenosis or significant atherosclerotic disease. Tortuosity of the cervical ICA. Vertebral arteries: Vertebral arteries codominant and patent within the neck without stenosis or significant atherosclerotic disease. Skeleton: No acute fracture or aggressive osseous lesion. Other neck: No neck mass  or cervical lymphadenopathy. Upper chest: Prior median sternotomy. No consolidation within the imaged lung apices. Other: Partially imaged nonspecific edema/stranding within the left axilla. Review of the MIP images confirms the above findings CTA HEAD FINDINGS Anterior circulation: The intracranial internal carotid arteries are patent. The M1 middle cerebral arteries are patent. No M2 proximal branch occlusion or high-grade proximal stenosis. The anterior cerebral arteries are patent. No intracranial aneurysm is identified. Posterior circulation: The intracranial vertebral arteries are patent. The basilar artery is patent. The posterior cerebral arteries are patent. Posterior communicating arteries are present bilaterally. Venous sinuses: Within the limitations of contrast timing, no convincing thrombus. Anatomic variants: As described. Other: Ovoid cystic-appearing lesions within the left temporal and left preauricular soft tissues measuring up to 1.7 cm. Review of the MIP images confirms the above findings IMPRESSION: CT head: 1. Known small right frontoparietal lobe acute infarcts were better appreciated on the brain MRI of 01/09/2022. 2. Background mild chronic small vessel image changes within the cerebral white matter. 3. Redemonstrated small chronic infarcts within the right cerebellar hemisphere. 4. Small right mastoid effusion. CTA neck: 1. The common carotid, internal carotid and vertebral arteries are patent within the neck without stenosis or significant atherosclerotic disease. 2. Partially imaged nonspecific edema/stranding within the left axilla. Correlate clinically and consider dedicated imaging. CTA head: 1. No intracranial large vessel occlusion or proximal high-grade arterial stenosis. 2. Cystic-appearing lesions within the left temporal and preauricular soft tissues, measuring up to 1.7 cm. Direct visualization recommended. Electronically Signed   By: Kellie Simmering D.O.   On: 01/10/2022 10:13    CT Extrem Up Entire Arm L W/CM  Result Date: 01/10/2022 CLINICAL DATA:  Soft tissue mass in the right upper arm. EXAM: CT OF THE UPPER LEFT EXTREMITY WITH CONTRAST TECHNIQUE:  Multidetector CT imaging of the upper left extremity was performed according to the standard protocol following intravenous contrast administration. RADIATION DOSE REDUCTION: This exam was performed according to the departmental dose-optimization program which includes automated exposure control, adjustment of the mA and/or kV according to patient size and/or use of iterative reconstruction technique. CONTRAST:  75 mL OMNIPAQUE IOHEXOL 350 MG/ML SOLN COMPARISON:  None Available. FINDINGS: Bones/Joint/Cartilage No acute bony or joint abnormality is identified. No focal bone lesion is seen. 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 No joint effusion is identified. Ligaments Suboptimally assessed by CT. Muscles and Tendons There is a fluid collection in the left axilla which extends into the coracobrachialis muscle. The collection measures approximately 4 cm transverse by 3 cm AP by 6.5 cm craniocaudal. Although visualization is somewhat limited as the patient's arm is at his side, an additional fluid collection is seen in the brachialis muscle which measures approximately 2.7 by 2.2 by 3.2 cm. Just lateral to this collection a third smaller collection measuring 1.4 by 1.4 cm in the axial plane is identified. There is stranding in the soft tissues in the anterior aspect of the left upper arm and left axilla. Soft tissues Imaged left lung parenchyma demonstrates mild atelectasis. Marked cardiomegaly is partially visualized. IMPRESSION: Findings most consistent with cellulitis and pyomyositis involving the left axilla and anterior compartment of the left upper arm. Large collection is in the left axilla and extends into the coracobrachialis as described above. MRI  with IV contrast is a more sensitive test for detection pyomyositis. Electronically Signed   By: Inge Rise M.D.   On: 01/10/2022 10:06   MR BRAIN WO CONTRAST  Result Date: 01/09/2022 CLINICAL DATA:  Initial evaluation for neuro deficit, stroke suspected. EXAM: MRI HEAD WITHOUT CONTRAST TECHNIQUE: Multiplanar, multiecho pulse sequences of the brain and surrounding structures were obtained without intravenous contrast. COMPARISON:  Head CT from earlier the same day. FINDINGS: Brain: Cerebral volume within normal limits. Mild hazy and patchy T2/FLAIR signal intensity seen involving the periventricular and deep white matter both cerebral hemispheres, nonspecific, but most characteristic of chronic small vessel ischemic disease, advanced for age. Ischemia. Small remote right cerebellar infarct noted. Patchy small volume foci of restricted diffusion seen involving the cortical subcortical right frontoparietal region (series 15, images 85, 84, 81), consistent with small acute ischemic infarcts. These are likely embolic in nature. No associated hemorrhage or mass effect. No other evidence for acute or subacute ischemia. No acute intracranial hemorrhage. Multiple scattered chronic micro hemorrhages noted involving the cerebellum and both cerebral hemispheres, nonspecific, but favored to be hypertensive in nature. No mass lesion, midline shift or mass effect. No hydrocephalus or extra-axial fluid collection. Pituitary gland and suprasellar region within normal limits. Vascular: Major intracranial vascular flow voids are maintained. Skull and upper cervical spine: Craniocervical junction within normal limits. Bone marrow signal intensity diffusely decreased on T1 weighted sequence, nonspecific, but most commonly related to anemia, smoking or obesity. No focal marrow replacing lesion. Few small cystic lesions measuring up to 1.8 cm seen within the visualized left temporal and pre-auricular region, nonspecific, but  likely sebaceous cyst (series 27, images 10, 6). Sinuses/Orbits: Globes and orbital soft tissues within normal limits. Paranasal sinuses are largely clear. Small right mastoid effusion, of doubtful significance. Other: None. IMPRESSION: 1. Patchy small volume acute ischemic nonhemorrhagic cortical infarcts involving the subcortical right frontoparietal region. These are likely embolic in nature. 2. Underlying mild chronic microvascular ischemic  disease, advanced for age. Small remote right cerebellar infarct. 3. Multiple scattered chronic micro hemorrhages involving the cerebellum and both cerebral hemispheres, nonspecific, but favored to be secondary to poorly controlled hypertension. Electronically Signed   By: Rise Mu M.D.   On: 01/09/2022 23:42   CT HEAD WO CONTRAST ( )  Result Date: 01/09/2022 CLINICAL DATA:  Neuro deficit, acute stroke suspected. Generalized weakness. EXAM: CT HEAD WITHOUT CONTRAST TECHNIQUE: Contiguous axial images were obtained from the base of the skull through the vertex without intravenous contrast. RADIATION DOSE REDUCTION: This exam was performed according to the departmental dose-optimization program which includes automated exposure control, adjustment of the mA and/or kV according to patient size and/or use of iterative reconstruction technique. COMPARISON:  CT head 07/24/2021 FINDINGS: Brain: There is no evidence of acute intracranial hemorrhage, mass lesion, brain edema or extra-axial fluid collection. The ventricles and subarachnoid spaces are appropriately sized for age. There is no CT evidence of acute cortical infarction. Vascular:  No hyperdense vessel identified. Skull: Negative for fracture or focal lesion. Sinuses/Orbits: The visualized paranasal sinuses and mastoid air cells are clear. No orbital abnormalities are seen. Other: Small ossific density in the right supraorbital scalp (image 39/4) is unchanged. Probable sebaceous cyst anterior to the left ear  (image 5/3), unchanged. IMPRESSION: Stable head CT without acute intracranial findings. No CT evidence of acute stroke. Electronically Signed   By: Carey Bullocks M.D.   On: 01/09/2022 17:55    Anti-infectives: Anti-infectives (From admission, onward)    Start     Dose/Rate Route Frequency Ordered Stop   01/08/22 0000  ceFEPime (MAXIPIME) IVPB        2 g Intravenous Every 8 hours 01/08/22 1224 02/08/22 2359   01/08/22 0000  vancomycin IVPB        750 mg Intravenous Every 12 hours 01/08/22 1224 02/07/22 2359   01/07/22 2328  vancomycin (VANCOREADY) IVPB 750 mg/150 mL        750 mg 150 mL/hr over 60 Minutes Intravenous Every 12 hours 01/07/22 1140     12/30/21 1400  ceFEPIme (MAXIPIME) 2 g in sodium chloride 0.9 % 100 mL IVPB        2 g 200 mL/hr over 30 Minutes Intravenous Every 8 hours 12/30/21 0710 02/06/22 2359   12/30/21 1000  vancomycin (VANCOREADY) IVPB 750 mg/150 mL  Status:  Discontinued        750 mg 150 mL/hr over 60 Minutes Intravenous Every 8 hours 12/30/21 0713 01/07/22 1140   12/29/21 1015  vancomycin (VANCOREADY) IVPB 1250 mg/250 mL  Status:  Discontinued        1,250 mg 166.7 mL/hr over 90 Minutes Intravenous Every 24 hours 12/29/21 0915 12/30/21 0713   12/28/21 1700  ceFEPIme (MAXIPIME) 2 g in sodium chloride 0.9 % 100 mL IVPB  Status:  Discontinued        2 g 200 mL/hr over 30 Minutes Intravenous Every 12 hours 12/28/21 1227 12/30/21 0710   12/28/21 1227  vancomycin variable dose per unstable renal function (pharmacist dosing)  Status:  Discontinued         Does not apply See admin instructions 12/28/21 1227 12/29/21 1233   12/28/21 0600  vancomycin (VANCOREADY) IVPB 1250 mg/250 mL  Status:  Discontinued        1,250 mg 166.7 mL/hr over 90 Minutes Intravenous Every 12 hours 12/27/21 1812 12/28/21 1235   12/27/21 1815  ceFEPIme (MAXIPIME) 2 g in sodium chloride 0.9 % 100 mL IVPB  Status:  Discontinued        2 g 200 mL/hr over 30 Minutes Intravenous Every 8 hours  12/27/21 1812 12/28/21 1227   12/27/21 1645  ceFEPIme (MAXIPIME) 2 g in sodium chloride 0.9 % 100 mL IVPB  Status:  Discontinued        2 g 200 mL/hr over 30 Minutes Intravenous  Once 12/27/21 1634 12/27/21 1644   12/27/21 1645  metroNIDAZOLE (FLAGYL) IVPB 500 mg        500 mg 100 mL/hr over 60 Minutes Intravenous  Once 12/27/21 1634 12/27/21 1944   12/27/21 1645  vancomycin (VANCOCIN) IVPB 1000 mg/200 mL premix  Status:  Discontinued        1,000 mg 200 mL/hr over 60 Minutes Intravenous  Once 12/27/21 1634 12/27/21 1643   12/27/21 1645  vancomycin (VANCOREADY) IVPB 1750 mg/350 mL        1,750 mg 175 mL/hr over 120 Minutes Intravenous  Once 12/27/21 1643 12/27/21 2150   12/27/21 1645  ceFEPIme (MAXIPIME) 2 g in sodium chloride 0.9 % 100 mL IVPB  Status:  Discontinued        2 g 200 mL/hr over 30 Minutes Intravenous Every 8 hours 12/27/21 1644 12/27/21 1812        Assessment/Plan 36 year old male with multiple medical co-morbidities including, but not limited to, acute right MCA stroke, endocarditis of prosthetic mitral valve, and heart failure with reduced ejection fraction on oral anticoagulation who has a left axillary/upper arm abscess with cellulitis on exam and pyomyositis on CT.  Given his acute on chronic heart failure and need for anticoagulation We think placing this patient under a general anesthetic for incision and drainage of his axilla would be high risk. Cardiology is following. We attempted bedside aspiration on 7/28. His INR is elevated at 2.7 and suspect this likely cannot be held for procedure given recent acute embolic stroke. Continue broad-spectrum antibiotics for now and we will follow.    LOS: 14 days    Jacinto Halim , [facility] 01/11/2022, 11:24 AM Please see Amion for pager number during day hours 7:00am-4:30pm

## 2022-01-11 NOTE — Progress Notes (Signed)
Advanced Heart Failure Rounding Note   Subjective:   7/19 Extubated. Diuresed with IV lasix. I/O not accurate. Norepi weaned off.  7/20 Swan out  7/23 Milrinone off 7/27 Acute LUE weakness. CODE Stroke. MRI w/ several small acute right MCA territory ischemic strokes, suggestive of a cardioembolic etiology.   Neurology following for stroke.  ID following for endocarditis.  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  Blood pressure stable.  Continues to be unable to lift his left upper extremity.  Mother at the bedside.  No complaints other than left upper extremity pain.   Objective:     Vital Signs:   Temp:  [97.9 F (36.6 C)-98.7 F (37.1 C)] 98.1 F (36.7 C) (07/29 0824) Pulse Rate:  [77-80] 79 (07/29 0824) Resp:  [12-20] 12 (07/29 0824) BP: (112-127)/(84-98) 121/84 (07/29 0824) SpO2:  [97 %-99 %] 97 % (07/29 0358) Weight:  [87.4 kg] 87.4 kg (07/29 0358) Last BM Date : 01/08/22  Weight change: Filed Weights   01/08/22 0500 01/10/22 0424 01/11/22 0358  Weight: 89 kg 87 kg 87.4 kg    Intake/Output:   Intake/Output Summary (Last 24 hours) at 01/11/2022 1026 Last data filed at 01/11/2022 0900 Gross per 24 hour  Intake --  Output 2050 ml  Net -2050 ml    PHYSICAL EXAM: GEN: Well nourished, well developed, in no acute distress  HEENT: normal  Neck: no JVD, carotid bruits, or masses Cardiac: RRR; no murmurs, rubs, or gallops,no edema  Respiratory:  clear to auscultation bilaterally, normal work of breathing GI: soft, nontender, nondistended, + BS MS: no deformity or atrophy  Skin: warm and dry Neuro: Left upper extremity weakness Psych: euthymic mood, full affect   Telemetry: Sinus rhythm-personally reviewed   Labs: Basic Metabolic Panel: Recent Labs  Lab 01/05/22 0342 01/06/22 0409 01/07/22 0357 01/08/22 0525 01/09/22 0624 01/10/22 0505 01/11/22 0452  NA 137   < > 132* 132* 133* 130* 128*  K 4.6   < > 4.2 4.2 4.5 3.9 4.0   CL 104   < > 102 103 103 102 99  CO2 28   < > 24 24 23 22  21*  GLUCOSE 106*   < > 109* 86 83 120* 107*  BUN 17   < > 22* 18 21* 13 11  CREATININE 0.74   < > 0.84 0.84 0.82 0.77 0.78  CALCIUM 8.5*   < > 8.7* 9.1 9.1 9.0 9.0  MG 1.6*   < > 1.7 1.7 1.7 1.6* 1.5*  PHOS 3.3  --   --   --   --   --   --    < > = values in this interval not displayed.     Liver Function Tests: Recent Labs  Lab 01/06/22 0409 01/07/22 0357 01/08/22 0525  AST 407* 317* 240*  ALT 498* 375* 294*  ALKPHOS 115 128* 129*  BILITOT 1.0 1.0 1.0  PROT 7.6 8.4* 8.7*  ALBUMIN 2.2* 2.4* 2.5*    No results for input(s): "LIPASE", "AMYLASE" in the last 168 hours. No results for input(s): "AMMONIA" in the last 168 hours.   CBC: Recent Labs  Lab 01/07/22 0357 01/08/22 0525 01/09/22 0624 01/10/22 0505 01/11/22 0452  WBC 9.5 8.6 8.0 7.5 7.7  HGB 9.7* 9.9* 10.0* 10.2* 9.8*  HCT 30.4* 30.9* 31.3* 31.4* 30.7*  MCV 82.6 81.7 82.2 81.1 82.1  PLT 319 357 397 380 365     Cardiac Enzymes: Recent Labs  Lab 01/10/22  0505  CKTOTAL 180     BNP: BNP (last 3 results) Recent Labs    07/24/21 1433 10/11/21 1230 12/27/21 1451  BNP 3,184.1* 400.8* >4,500.0*     ProBNP (last 3 results) No results for input(s): "PROBNP" in the last 8760 hours.    Other results:  Imaging: VAS [place] UPPER EXTREMITY VENOUS DUPLEX  Result Date: 01/10/2022 UPPER VENOUS STUDY  Patient Name:  Donald [name]  Date of Exam:   01/09/2022 Medical Rec #: [id number]            Accession #:    [id number] Date of Birth: [birth year]           Patient Gender: M Patient Age:   36 years Exam Location:  [facility] Procedure:      VAS [place] UPPER EXTREMITY VENOUS DUPLEX Referring Phys: Janit Pagan --------------------------------------------------------------------------------  Indications: Pain, and Swelling Limitations: Patient inability to lie still and constant sitting upright. Comparison Study: No previous exam noted. Performing  Technologist: Magdalene River BS, RVT  Examination Guidelines: A complete evaluation includes B-mode imaging, spectral Doppler, color Doppler, and power Doppler as needed of all accessible portions of each vessel. Bilateral testing is considered an integral part of a complete examination. Limited examinations for reoccurring indications may be performed as noted.  Right Findings: +----------+------------+---------+-----------+----------+-------+ RIGHT     CompressiblePhasicitySpontaneousPropertiesSummary +----------+------------+---------+-----------+----------+-------+ Subclavian    Full       Yes       Yes                      +----------+------------+---------+-----------+----------+-------+  Left Findings: +----------+------------+---------+-----------+----------+--------------+ LEFT      CompressiblePhasicitySpontaneousProperties   Summary     +----------+------------+---------+-----------+----------+--------------+ IJV           Full       Yes       Yes                             +----------+------------+---------+-----------+----------+--------------+ Subclavian    Full       Yes       Yes                             +----------+------------+---------+-----------+----------+--------------+ Axillary      Full       Yes       Yes                             +----------+------------+---------+-----------+----------+--------------+ Brachial      Full                                                 +----------+------------+---------+-----------+----------+--------------+ Radial        Full                                                 +----------+------------+---------+-----------+----------+--------------+ Ulnar         Full                                                 +----------+------------+---------+-----------+----------+--------------+  Cephalic      Full                                                  +----------+------------+---------+-----------+----------+--------------+ Basilic                                             Not visualized +----------+------------+---------+-----------+----------+--------------+ The left basilic vein was not visualized.  Incidental finding of a non-vascularized, mostly anechoic mass noted in the shoulder area and extending into the upper arm.  Summary:  Right: No evidence of thrombosis in the subclavian.  Left: No evidence of deep vein thrombosis in the upper extremity. No evidence of superficial vein thrombosis in the upper extremity.  *See table(s) above for measurements and observations.  Diagnosing physician: Jamelle Haring Electronically signed by Jamelle Haring on 01/10/2022 at 2:03:09 PM.    Final    CT ANGIO HEAD NECK W WO CM  Result Date: 01/10/2022 CLINICAL DATA:  Provided history: Stroke/TIA, determine embolic source. EXAM: CT ANGIOGRAPHY HEAD AND NECK TECHNIQUE: Multidetector CT imaging of the head and neck was performed using the standard protocol during bolus administration of intravenous contrast. Multiplanar CT image reconstructions and MIPs were obtained to evaluate the vascular anatomy. Carotid stenosis measurements (when applicable) are obtained utilizing NASCET criteria, using the distal internal carotid diameter as the denominator. RADIATION DOSE REDUCTION: This exam was performed according to the departmental dose-optimization program which includes automated exposure control, adjustment of the mA and/or kV according to patient size and/or use of iterative reconstruction technique. CONTRAST:  71mL OMNIPAQUE IOHEXOL 350 MG/ML SOLN COMPARISON:  Brain MRI 01/09/2022.  Head CT 01/09/2022. FINDINGS: CT HEAD FINDINGS Brain: No age advanced or lobar predominant parenchymal atrophy. Known small acute right frontoparietal lobe infarcts were better appreciated on the brain MRI of 01/09/2022. Background mild chronic small vessel ischemic changes within the  cerebral white matter, also better appreciated on yesterday's MRI. Redemonstrated small chronic infarct within the right cerebellar hemisphere. Partially empty sella turcica, a nonspecific finding. There is no acute intracranial hemorrhage. No extra-axial fluid collection. No evidence of an intracranial mass. No midline shift. Vascular: No hyperdense vessel. Skull: No fracture or aggressive osseous lesion. Sinuses/Orbits: No mass or acute finding within the imaged orbits. No significant paranasal sinus disease. Other: Small-volume fluid within the right mastoid air cells. Review of the MIP images confirms the above findings CTA NECK FINDINGS Aortic arch: Common origin of the innominate and left common carotid arteries. The visualized aortic arch is normal in caliber. No hemodynamically significant innominate or proximal subclavian artery stenosis. Right carotid system: CCA and ICA patent within the neck without stenosis or significant atherosclerotic disease. Left carotid system: CCA and ICA patent within the neck without stenosis or significant atherosclerotic disease. Tortuosity of the cervical ICA. Vertebral arteries: Vertebral arteries codominant and patent within the neck without stenosis or significant atherosclerotic disease. Skeleton: No acute fracture or aggressive osseous lesion. Other neck: No neck mass or cervical lymphadenopathy. Upper chest: Prior median sternotomy. No consolidation within the imaged lung apices. Other: Partially imaged nonspecific edema/stranding within the left axilla. Review of the MIP images confirms the above findings CTA HEAD FINDINGS Anterior circulation: The intracranial internal carotid arteries are patent. The M1 middle cerebral arteries  are patent. No M2 proximal branch occlusion or high-grade proximal stenosis. The anterior cerebral arteries are patent. No intracranial aneurysm is identified. Posterior circulation: The intracranial vertebral arteries are patent. The basilar  artery is patent. The posterior cerebral arteries are patent. Posterior communicating arteries are present bilaterally. Venous sinuses: Within the limitations of contrast timing, no convincing thrombus. Anatomic variants: As described. Other: Ovoid cystic-appearing lesions within the left temporal and left preauricular soft tissues measuring up to 1.7 cm. Review of the MIP images confirms the above findings IMPRESSION: CT head: 1. Known small right frontoparietal lobe acute infarcts were better appreciated on the brain MRI of 01/09/2022. 2. Background mild chronic small vessel image changes within the cerebral white matter. 3. Redemonstrated small chronic infarcts within the right cerebellar hemisphere. 4. Small right mastoid effusion. CTA neck: 1. The common carotid, internal carotid and vertebral arteries are patent within the neck without stenosis or significant atherosclerotic disease. 2. Partially imaged nonspecific edema/stranding within the left axilla. Correlate clinically and consider dedicated imaging. CTA head: 1. No intracranial large vessel occlusion or proximal high-grade arterial stenosis. 2. Cystic-appearing lesions within the left temporal and preauricular soft tissues, measuring up to 1.7 cm. Direct visualization recommended. Electronically Signed   By: Kellie Simmering D.O.   On: 01/10/2022 10:13   CT Extrem Up Entire Arm L W/CM  Result Date: 01/10/2022 CLINICAL DATA:  Soft tissue mass in the right upper arm. EXAM: CT OF THE UPPER LEFT EXTREMITY WITH CONTRAST TECHNIQUE: Multidetector CT imaging of the upper left extremity was performed according to the standard protocol following intravenous contrast administration. RADIATION DOSE REDUCTION: This exam was performed according to the departmental dose-optimization program which includes automated exposure control, adjustment of the mA and/or kV according to patient size and/or use of iterative reconstruction technique. CONTRAST:  75 mL OMNIPAQUE  IOHEXOL 350 MG/ML SOLN COMPARISON:  None Available. FINDINGS: Bones/Joint/Cartilage No acute bony or joint abnormality is identified. No focal bone lesion is seen. 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 No joint effusion is identified. Ligaments Suboptimally assessed by CT. Muscles and Tendons There is a fluid collection in the left axilla which extends into the coracobrachialis muscle. The collection measures approximately 4 cm transverse by 3 cm AP by 6.5 cm craniocaudal. Although visualization is somewhat limited as the patient's arm is at his side, an additional fluid collection is seen in the brachialis muscle which measures approximately 2.7 by 2.2 by 3.2 cm. Just lateral to this collection a third smaller collection measuring 1.4 by 1.4 cm in the axial plane is identified. There is stranding in the soft tissues in the anterior aspect of the left upper arm and left axilla. Soft tissues Imaged left lung parenchyma demonstrates mild atelectasis. Marked cardiomegaly is partially visualized. IMPRESSION: Findings most consistent with cellulitis and pyomyositis involving the left axilla and anterior compartment of the left upper arm. Large collection is in the left axilla and extends into the coracobrachialis as described above. MRI with IV contrast is a more sensitive test for detection pyomyositis. Electronically Signed   By: Inge Rise M.D.   On: 01/10/2022 10:06   MR BRAIN WO CONTRAST  Result Date: 01/09/2022 CLINICAL DATA:  Initial evaluation for neuro deficit, stroke suspected. EXAM: MRI HEAD WITHOUT CONTRAST TECHNIQUE: Multiplanar, multiecho pulse sequences of the brain and surrounding structures were obtained without intravenous contrast. COMPARISON:  Head CT from earlier the same day. FINDINGS: Brain: Cerebral volume within normal limits.  Mild hazy and patchy T2/FLAIR signal intensity seen involving the  periventricular and deep white matter both cerebral hemispheres, nonspecific, but most characteristic of chronic small vessel ischemic disease, advanced for age. Ischemia. Small remote right cerebellar infarct noted. Patchy small volume foci of restricted diffusion seen involving the cortical subcortical right frontoparietal region (series 15, images 85, 84, 81), consistent with small acute ischemic infarcts. These are likely embolic in nature. No associated hemorrhage or mass effect. No other evidence for acute or subacute ischemia. No acute intracranial hemorrhage. Multiple scattered chronic micro hemorrhages noted involving the cerebellum and both cerebral hemispheres, nonspecific, but favored to be hypertensive in nature. No mass lesion, midline shift or mass effect. No hydrocephalus or extra-axial fluid collection. Pituitary gland and suprasellar region within normal limits. Vascular: Major intracranial vascular flow voids are maintained. Skull and upper cervical spine: Craniocervical junction within normal limits. Bone marrow signal intensity diffusely decreased on T1 weighted sequence, nonspecific, but most commonly related to anemia, smoking or obesity. No focal marrow replacing lesion. Few small cystic lesions measuring up to 1.8 cm seen within the visualized left temporal and pre-auricular region, nonspecific, but likely sebaceous cyst (series 27, images 10, 6). Sinuses/Orbits: Globes and orbital soft tissues within normal limits. Paranasal sinuses are largely clear. Small right mastoid effusion, of doubtful significance. Other: None. IMPRESSION: 1. Patchy small volume acute ischemic nonhemorrhagic cortical infarcts involving the subcortical right frontoparietal region. These are likely embolic in nature. 2. Underlying mild chronic microvascular ischemic disease, advanced for age. Small remote right cerebellar infarct. 3. Multiple scattered chronic micro hemorrhages involving the cerebellum and both  cerebral hemispheres, nonspecific, but favored to be secondary to poorly controlled hypertension. Electronically Signed   By: Jeannine Boga M.D.   On: 01/09/2022 23:42   CT HEAD WO CONTRAST (5MM)  Result Date: 01/09/2022 CLINICAL DATA:  Neuro deficit, acute stroke suspected. Generalized weakness. EXAM: CT HEAD WITHOUT CONTRAST TECHNIQUE: Contiguous axial images were obtained from the base of the skull through the vertex without intravenous contrast. RADIATION DOSE REDUCTION: This exam was performed according to the departmental dose-optimization program which includes automated exposure control, adjustment of the mA and/or kV according to patient size and/or use of iterative reconstruction technique. COMPARISON:  CT head 07/24/2021 FINDINGS: Brain: There is no evidence of acute intracranial hemorrhage, mass lesion, brain edema or extra-axial fluid collection. The ventricles and subarachnoid spaces are appropriately sized for age. There is no CT evidence of acute cortical infarction. Vascular:  No hyperdense vessel identified. Skull: Negative for fracture or focal lesion. Sinuses/Orbits: The visualized paranasal sinuses and mastoid air cells are clear. No orbital abnormalities are seen. Other: Small ossific density in the right supraorbital scalp (image 39/4) is unchanged. Probable sebaceous cyst anterior to the left ear (image 5/3), unchanged. IMPRESSION: Stable head CT without acute intracranial findings. No CT evidence of acute stroke. Electronically Signed   By: Richardean Sale M.D.   On: 01/09/2022 17:55     Medications:     Scheduled Medications:  amiodarone  200 mg Oral Daily   aspirin  81 mg Oral Daily   Chlorhexidine Gluconate Cloth  6 each Topical Daily   dapagliflozin propanediol  10 mg Oral Daily   digoxin  0.125 mg Oral Daily   enoxaparin (LOVENOX) injection  80 mg Subcutaneous Q12H   feeding supplement  237 mL Oral TID BM   midodrine  2.5 mg Oral TID WC   rosuvastatin  40 mg  Oral Daily   sodium chloride flush  10-40 mL Intracatheter Q12H   Warfarin - Pharmacist Dosing Inpatient   Does not apply q1600    Infusions:  sodium chloride 10 mL/hr at 01/06/22 0901   sodium chloride 10 mL/hr at 01/07/22 2200   ceFEPime (MAXIPIME) IV 2 g (01/11/22 0543)   vancomycin 750 mg (01/10/22 2314)    PRN Medications: sodium chloride, sodium chloride, ipratropium-albuterol, mouth rinse, oxyCODONE, sodium chloride flush   Assessment/Plan:   1.  Mixed cardiogenic and septic shock: Echo with an ejection fraction of 10% with severe RV hypokinesis.  Currently on vancomycin and cefepime.  Off all inotropic support.  2.  Acute on chronic systolic and diastolic heart failure: No obvious volume overload.  Long-term option is likely transplant.  Optimal medical therapy Calvary Difranco need to be restarted as blood pressure improves.  3.  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  Currently on IV antibiotics per infectious disease.  Areatha Kalata need 6 weeks worth of antibiotics.  PICC line in place.  INR goal 2-1/2-3-1/2.  Would continue Lovenox until INR therapeutic.  4.  Hypoxic respiratory failure: Now extubated.  Stable oxygen on room air.  5.  Paroxysmal atrial fibrillation: Remains in sinus rhythm.  Continue amiodarone.  6.  Acute renal failure: Creatinine now at baseline  7.  Shock liver: Continue supportive care.  AST/ALT trending down.  7.  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  Currently on Humira per rheumatology as an outpatient.  HF medications at discharge: Aspirin 81 mg daily Amiodarone 200 mg daily Farxiga 10 mg daily Digoxin 0.125 mg daily Midodrine 2.5 mg TID Lasix 40 mg daily Potassium chloride 40 mEQ daily Do not resume losartan or spiro  IV Vancomycin and Cefepime per ID orders.  Cardiology to see as needed through the weekend.  Please do not hesitate to call us back if any further issues  arise.   Length of Stay: 14  Kripa Foskey Jorja Loa, MD  10:26 AM   01/11/2022, 10:26 AM  Advanced Heart Failure Team Pager [phone] (M-F; 7a - 4p)  Please contact [facility] for night-coverage after hours (4p -7a ) and weekends on amion.

## 2022-01-11 NOTE — Progress Notes (Signed)
ANTICOAGULATION CONSULT NOTE  Pharmacy Consult for enoxaparin>warfarin Indication:  MVR /AFib  No Known Allergies  Patient Measurements: Height: 5\' 10"  (177.8 cm) Weight: 87.4 kg (192 lb 10.9 oz) IBW/kg (Calculated) : 73 Heparin Dosing Weight: 90.7 kg  Vital Signs: Temp: 97.5 F (36.4 C) (07/29 1302) Temp Source: Oral (07/29 1302) BP: 127/93 (07/29 1302) Pulse Rate: 79 (07/29 1302)  Labs: Recent Labs    01/09/22 0624 01/10/22 0505 01/11/22 0452  HGB 10.0* 10.2* 9.8*  HCT 31.3* 31.4* 30.7*  PLT 397 380 365  LABPROT 20.1* 22.5* 28.6*  INR 1.7* 2.0* 2.7*  CREATININE 0.82 0.77 0.78  CKTOTAL  --  180  --      Estimated Creatinine Clearance: 135.6 mL/min (by C-G formula based on SCr of 0.78 mg/dL).   Medical History: Past Medical History:  Diagnosis Date   Anemia    Autoimmune disorder (HCC)    pyoderma gangrenosum   CHF (congestive heart failure) (HCC)    Chronic systolic heart failure (HCC)    a. EF 35-40% by echo in 07/2018 b. EF at 45% by repeat echo in 04/2020   DVT (deep venous thrombosis) (HCC)    h/o   Dysrhythmia    Mitral regurgitation    a. 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 b. 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HCC)    arthritis   Shock, septic and cardiogenic 12/28/2021   Tricuspid regurgitation    Assessment: 35 yom with a history of mitral regurgitation s/p mechanical valve replacement on Warfarin, HF, AF and prior DVT, and Hx of hidradenitis and pyoderma gangrenosum .   Patient taking warfarin PTA 4mg  daily per last AC note.  Patient changed to full dose lovenox 7/26. INR trended up to 2.7 on 7/29. No signs or symptoms of bleeding documented.    Patient continues on broad antibiotics for non speciated  endocarditis.   Goal of Therapy:  INR goal 2.5-3.5 Anti Xa level 0.6-1.2 drawn 4hr after dose as needed Monitor platelets by anticoagulation protocol: Yes   Plan:  Continue enoxaparin 80mg  q12h Warfarin 5mg  today Continue to monitor daily CBC, INR, signs/symptoms of bleeding, and SCr  DC planning with enoxaparin 1mg /kg = 80mg  sq q12h bridge until INR at goal.   Warfarin 5mg  QHS  Enoxaparin $4   8/26, PharmD PGY1 Pharmacy Resident  7/29/20232:48 PM

## 2022-01-11 NOTE — Procedures (Signed)
Preop: left axillary abscess  Postop: same  Procedure: attempted aspiration of abscess.  Description: The site was identified and washed with betadine. A 21 ga needle was attempted to access the abscess at the maximum fluctuant area. Venous blood was aspirated. Decision was made not to make a second attempt. Pressure was held at the wound and bandage placed.  Continue antibiotics Due to need for coumadin in face of recent stroke, I do not think he should come off anticoagulation to allow for different procedure Due to severe heart failure I do not think he can undergo procedure in OR with anesthesia No plans for additional procedure for abscess

## 2022-01-11 NOTE — Progress Notes (Signed)
Daily Progress Note Intern Pager: [phone]  Patient name: Donald [name] Medical record number: [id number] Date of birth: [birth year] Age: 36 y.o. Gender: male  Primary Care Provider: No primary care provider on file. Consultants: Pulmonary critical care, ID, cardiology, heart failure team, pharmacy Code Status: Full  Pt Overview and Major Events to Date:  -7/14 admitted FPTS for sepsis, N/V. Started on flagyl, cefepime, vanc -7/15 worsening shock> ICU> intubated, pressors, abx. Flagyl stopped -7/16 decreasing milrinone for high CO and co-ox -7/19 extubated -7/21 drop in cardiac index, Levophed restarted, Swan discontinued -7/22 levophed stopped -7/23 milrinone stopped -7/24 out to FMTS -7/27 acute stroke  and swelling of L upper arm, CT w/ pyomyositis  Assessment and Plan: Donald [name] is a 36 y.o. 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 Pertinent includes MR s/p MV mechanical valve on warfarin, biventricular HF, and PAF.   * Endocarditis of prosthetic mitral valve (HCC) Stable, afebrile last 24 hours.  Continue vancomycin and cefepime through PICC.  Patient is set up to go home with home antibiotic infusions.  End date 8/27. - Heart failure pharmacist assisting with Lovenox warfarin bridge - INR goal 2.5-3.5 - Cardiology and heart failure team on board, appreciate care and recommendations   Left arm pain and swelling CT with contrast concerning for cellulitis and pyomyositis of left axilla extending to coracobrachialis.  Surgery consulted, decline surgical management at this time given high risk on anticoagulation. - Surgery following, appreciate recommendations - Given ongoing prosthetic valve endocarditis, doubt risk-benefit analysis would lead [place] to hold anticoagulation  Acute embolic stroke (HCC) Stable.  CTA head/neck negative for aneurysm, so stroke team recommended to continue current  antithrombotics. Crestor 40 mg daily started 7/28.  Stroke team signed off, plan to follow-up with stroke clinic NP in [facility] in about 4 weeks.  Paroxysmal atrial fibrillation (HCC) Stable, rate controlled.  Continue amiodarone 200 mg daily.  Cardiology following, appreciate conditions.   Chronic heart failure (HCC) Stable at this time.  Lasix held per heart failure team.  - Continue digoxin and Farxiga - Wean midodrine per heart failure team - Appreciate heart failure team care and recommendations  Microcytic anemia Stable, chronic.  Hemoglobin near baseline.  Continue to monitor.  Prolonged QT interval Last QTc 504 on 7/27.  Plan for EKG today to follow-up   FEN/GI: Heart healthy PPx: Aspirin and warfarin with Lovenox bridge Dispo:Home pending clinical improvement .   Subjective:  Patient found sitting up in bed awake with mother at bedside.  We discussed most recent imaging findings and the new consults of neurology and surgery.  Answered all questions.  Patient in no acute distress, only complaint is his left arm continues to feel weak in his left bicep hurts.  Objective: Temp:  [97.9 F (36.6 C)-98.7 F (37.1 C)] 98.1 F (36.7 C) (07/29 0824) Pulse Rate:  [77-80] 79 (07/29 0824) Resp:  [12-20] 12 (07/29 0824) BP: (112-127)/(84-98) 121/84 (07/29 0824) SpO2:  [97 %-99 %] 97 % (07/29 0358) Weight:  [87.4 kg] 87.4 kg (07/29 0358)  Physical Exam: General: Awake, alert, oriented, no acute distress Cardiovascular: Regular rate and rhythm Respiratory: CTA B Abdomen: Nondistended Extremities: No BLE edema, left upper arm markedly swollen and tender to palpation however without overlying erythema or warmth  Laboratory: Most recent CBC Lab Results  Component Value Date   WBC 7.7 01/11/2022   HGB 9.8 (L) 01/11/2022   HCT 30.7 (  L) 01/11/2022   MCV 82.1 01/11/2022   PLT 365 01/11/2022   Most recent BMP    Latest Ref Rng & Units 01/11/2022     4:52 AM  BMP  Glucose 70 - 99 mg/dL 355   BUN 6 - 20 mg/dL 11   Creatinine 7.32 - 1.24 mg/dL 2.02   Sodium 542 - 706 mmol/L 128   Potassium 3.5 - 5.1 mmol/L 4.0   Chloride 98 - 111 mmol/L 99   CO2 22 - 32 mmol/L 21   Calcium 8.9 - 10.3 mg/dL 9.0    Imaging/Diagnostic Tests: CT OF THE UPPER LEFT EXTREMITY WITH CONTRAST  IMPRESSION: Findings most consistent with cellulitis and pyomyositis involving the left axilla and anterior compartment of the left upper arm. Large collection is in the left axilla and extends into the coracobrachialis as described above. MRI with IV contrast is a more sensitive test for detection pyomyositis.  CT ANGIOGRAPHY HEAD AND NECK IMPRESSION: CT head: 1. Known small right frontoparietal lobe acute infarcts were better appreciated on the brain MRI of 01/09/2022. 2. Background mild chronic small vessel image changes within the cerebral white matter. 3. Redemonstrated small chronic infarcts within the right cerebellar hemisphere. 4. Small right mastoid effusion.   CTA neck: 1. The common carotid, internal carotid and vertebral arteries are patent within the neck without stenosis or significant atherosclerotic disease. 2. Partially imaged nonspecific edema/stranding within the left axilla. Correlate clinically and consider dedicated imaging.   CTA head: 1. No intracranial large vessel occlusion or proximal high-grade arterial stenosis. 2. Cystic-appearing lesions within the left temporal and preauricular soft tissues, measuring up to 1.7 cm. Direct visualization recommended.   Fayette Pho, MD 01/11/2022, 9:36 AM PGY-3, [facility] FPTS Intern pager: [phone], text pages welcome Secure chat group [facility] Teaching Service

## 2022-01-12 DIAGNOSIS — L88 Pyoderma gangrenosum: Secondary | ICD-10-CM | POA: Diagnosis not present

## 2022-01-12 DIAGNOSIS — I5043 Acute on chronic combined systolic (congestive) and diastolic (congestive) heart failure: Secondary | ICD-10-CM | POA: Diagnosis not present

## 2022-01-12 DIAGNOSIS — I480 Paroxysmal atrial fibrillation: Secondary | ICD-10-CM | POA: Diagnosis not present

## 2022-01-12 DIAGNOSIS — T826XXA Infection and inflammatory reaction due to cardiac valve prosthesis, initial encounter: Secondary | ICD-10-CM | POA: Diagnosis not present

## 2022-01-12 DIAGNOSIS — M79602 Pain in left arm: Secondary | ICD-10-CM | POA: Diagnosis not present

## 2022-01-12 DIAGNOSIS — I38 Endocarditis, valve unspecified: Secondary | ICD-10-CM | POA: Diagnosis not present

## 2022-01-12 DIAGNOSIS — Z7901 Long term (current) use of anticoagulants: Secondary | ICD-10-CM | POA: Diagnosis not present

## 2022-01-12 DIAGNOSIS — N179 Acute kidney failure, unspecified: Secondary | ICD-10-CM | POA: Diagnosis not present

## 2022-01-12 LAB — MAGNESIUM: Magnesium: 1.6 mg/dL — ABNORMAL LOW (ref 1.7–2.4)

## 2022-01-12 LAB — CBC
HCT: 29.2 % — ABNORMAL LOW (ref 39.0–52.0)
Hemoglobin: 9.3 g/dL — ABNORMAL LOW (ref 13.0–17.0)
MCH: 26 pg (ref 26.0–34.0)
MCHC: 31.8 g/dL (ref 30.0–36.0)
MCV: 81.6 fL (ref 80.0–100.0)
Platelets: 383 10*3/uL (ref 150–400)
RBC: 3.58 MIL/uL — ABNORMAL LOW (ref 4.22–5.81)
RDW: 17.9 % — ABNORMAL HIGH (ref 11.5–15.5)
WBC: 8.3 10*3/uL (ref 4.0–10.5)
nRBC: 0 % (ref 0.0–0.2)

## 2022-01-12 LAB — PROTIME-INR
INR: 2.9 — ABNORMAL HIGH (ref 0.8–1.2)
Prothrombin Time: 29.8 seconds — ABNORMAL HIGH (ref 11.4–15.2)

## 2022-01-12 LAB — BASIC METABOLIC PANEL
Anion gap: 5 (ref 5–15)
BUN: 15 mg/dL (ref 6–20)
CO2: 23 mmol/L (ref 22–32)
Calcium: 8.8 mg/dL — ABNORMAL LOW (ref 8.9–10.3)
Chloride: 100 mmol/L (ref 98–111)
Creatinine, Ser: 0.82 mg/dL (ref 0.61–1.24)
GFR, Estimated: >60 mL/min (ref >60)
Glucose, Bld: 129 mg/dL — ABNORMAL HIGH (ref 70–99)
Potassium: 4 mmol/L (ref 3.5–5.1)
Sodium: 128 mmol/L — ABNORMAL LOW (ref 135–145)

## 2022-01-12 LAB — VANCOMYCIN, TROUGH: Vancomycin Tr: 9 ug/mL — ABNORMAL LOW (ref 15–20)

## 2022-01-12 LAB — VANCOMYCIN, PEAK: Vancomycin Pk: 28 ug/mL — ABNORMAL LOW (ref 30–40)

## 2022-01-12 MED ORDER — MAGNESIUM SULFATE 2 GM/50ML IV SOLN
2.0000 g | Freq: Once | INTRAVENOUS | Status: AC
  Administered 2022-01-12: 2 g via INTRAVENOUS
  Filled 2022-01-12: qty 50

## 2022-01-12 MED ORDER — WARFARIN SODIUM 7.5 MG PO TABS: 7.5000 mg | ORAL_TABLET | Freq: Once | ORAL | Status: DC

## 2022-01-12 NOTE — Assessment & Plan Note (Addendum)
Mg 1.7 (8/4) -4 Mg IV -AM Mg

## 2022-01-12 NOTE — Progress Notes (Signed)
Dr. Bryn Gulling was reached out for LUE fluid collection aspiration/drain placement.   The fluid collection amenable for aspiration/drain placement, but patient is on Coumadin and Lovenox, INR trending up and it is 2.9 today.   Dr. Bryn Gulling recommended d/c Coumadin and Lovenox and repeat INR in AM.  Will check INR and review the case with IR radiologist in AM again.   Pt made NPO at [facility] for possible procedure tomorrow.   Primary team to D/C Coumadin and Lovenox and repeat INR.  IR to follow, please call IR for questions and concerns.    Donald Bologna Lashann Hagg PA-C 01/12/2022 2:27 PM

## 2022-01-12 NOTE — Progress Notes (Signed)
Pharmacy Antibiotic Note  Donald [name] is a 36 y.o. male for which pharmacy has been consulted for cefepime and vancomycin dosing for prosthetic valve endocarditis.   Patient with a history of mitral regurgitation s/p valve replacement on Coumadin, HF, AF and prior DVT, and Hx of hidradenitis and pyoderma gangrenosum . Found to have a vegetation on mechanical mitral valve. Blood cultures are negative on 7/14 and 7/16. Scr stable at 0.82.   Last vancomycin levels done 7/30 resulted in a peak of 28 and a trough of 9, drawn appropriately, with a therapeutic AUC of 455.   Plan: Continue Vancomycin at 750 mg every 12 hours  Goal AUC 400-550 Repeat vancomycin levels on Friday, 8/4 Continue to update OPAT PRN  Continue cefepime 2 gm IV Q 8 hours  F/u renal function and clinical course  Height: 5\' 10"  (177.8 cm) Weight: 88.3 kg (194 lb 10.7 oz) IBW/kg (Calculated) : 73  Temp (24hrs), Avg:98 F (36.7 C), Min:97.5 F (36.4 C), Max:98.6 F (37 C)  Recent Labs  Lab 01/07/22 0357 01/07/22 0955 01/08/22 0525 01/09/22 0624 01/10/22 0505 01/11/22 0452 01/12/22 0125 01/12/22 1200  WBC 9.5  --  8.6 8.0 7.5 7.7 8.3  --   CREATININE 0.84  --  0.84 0.82 0.77 0.78 0.82  --   VANCOTROUGH  --  20  --   --   --   --   --  9*  VANCOPEAK 37  --   --   --   --   --  28*  --      Estimated Creatinine Clearance: 143.4 mL/min (by C-G formula based on SCr of 0.82 mg/dL).    No Known Allergies  Antimicrobials this admission: cefepime 7/14 >> (8/24) vancomycin 7/14 >> (8/24) metronidazole 7/14 >> 7/14  Microbiology results: *FYI it looks like only 1 cx on 7/14 prior to abx - several orders "cancelled" 7/15 BCx x 2 > ngf 7/14 BCx  ngf 7/16 Resp Cx > ngf   8/16, PharmD PGY1 Pharmacy Resident  7/30/20231:10 PM

## 2022-01-12 NOTE — Progress Notes (Signed)
Advanced Heart Failure Rounding Note   Subjective:   7/19 Extubated. Diuresed with IV lasix. I/O not accurate. Norepi weaned off.  7/20 Swan out  7/23 Milrinone off 7/27 Acute LUE weakness. CODE Stroke. MRI w/ several small acute right MCA territory ischemic strokes, suggestive of a cardioembolic etiology.   Neurology currently following for stroke, infectious disease following for endocarditis.  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  Blood pressure stable.  Continues to have issues with his left upper extremity, upper extremity is numb and unable to lift it.  Mother at the bedside.  No other complaints.    Objective:     Vital Signs:   Temp:  [97.5 F (36.4 C)-98.6 F (37 C)] 97.7 F (36.5 C) (07/30 0839) Pulse Rate:  [75-81] 79 (07/30 0839) Resp:  [16-20] 20 (07/30 0839) BP: (89-127)/(60-93) 89/60 (07/30 0839) SpO2:  [98 %-100 %] 100 % (07/30 0839) Weight:  [88.3 kg] 88.3 kg (07/30 0324) Last BM Date : 01/10/22  Weight change: Filed Weights   01/10/22 0424 01/11/22 0358 01/12/22 0324  Weight: 87 kg 87.4 kg 88.3 kg    Intake/Output:   Intake/Output Summary (Last 24 hours) at 01/12/2022 1038 Last data filed at 01/12/2022 0323 Gross per 24 hour  Intake 2413.67 ml  Output 1100 ml  Net 1313.67 ml    PHYSICAL EXAM: GEN: Well nourished, well developed, in no acute distress  HEENT: normal  Neck: no JVD, carotid bruits, or masses Cardiac: RRR; no murmurs, rubs, or gallops,no edema  Respiratory:  clear to auscultation bilaterally, normal work of breathing GI: soft, nontender, nondistended, + BS MS: no deformity or atrophy  Skin: warm and dry Neuro: Upper extremity swelling and weakness/numbness Psych: euthymic mood, full affect   Telemetry: Sinus rhythm-personally reviewed   Labs: Basic Metabolic Panel: Recent Labs  Lab 01/08/22 0525 01/09/22 0624 01/10/22 0505 01/11/22 0452 01/12/22 0125  NA 132* 133* 130* 128* 128*  K  4.2 4.5 3.9 4.0 4.0  CL 103 103 102 99 100  CO2 24 23 22  21* 23  GLUCOSE 86 83 120* 107* 129*  BUN 18 21* 13 11 15   CREATININE 0.84 0.82 0.77 0.78 0.82  CALCIUM 9.1 9.1 9.0 9.0 8.8*  MG 1.7 1.7 1.6* 1.5* 1.6*     Liver Function Tests: Recent Labs  Lab 01/06/22 0409 01/07/22 0357 01/08/22 0525  AST 407* 317* 240*  ALT 498* 375* 294*  ALKPHOS 115 128* 129*  BILITOT 1.0 1.0 1.0  PROT 7.6 8.4* 8.7*  ALBUMIN 2.2* 2.4* 2.5*    No results for input(s): "LIPASE", "AMYLASE" in the last 168 hours. No results for input(s): "AMMONIA" in the last 168 hours.   CBC: Recent Labs  Lab 01/08/22 0525 01/09/22 0624 01/10/22 0505 01/11/22 0452 01/12/22 0125  WBC 8.6 8.0 7.5 7.7 8.3  HGB 9.9* 10.0* 10.2* 9.8* 9.3*  HCT 30.9* 31.3* 31.4* 30.7* 29.2*  MCV 81.7 82.2 81.1 82.1 81.6  PLT 357 397 380 365 383     Cardiac Enzymes: Recent Labs  Lab 01/10/22 0505  CKTOTAL 180     BNP: BNP (last 3 results) Recent Labs    07/24/21 1433 10/11/21 1230 12/27/21 1451  BNP 3,184.1* 400.8* >4,500.0*     ProBNP (last 3 results) No results for input(s): "PROBNP" in the last 8760 hours.    Other results:  Imaging: No results found.   Medications:     Scheduled Medications:  amiodarone  200 mg Oral Daily  aspirin  81 mg Oral Daily   Chlorhexidine Gluconate Cloth  6 each Topical Daily   dapagliflozin propanediol  10 mg Oral Daily   digoxin  0.125 mg Oral Daily   enoxaparin (LOVENOX) injection  80 mg Subcutaneous Q12H   feeding supplement  237 mL Oral TID BM   midodrine  2.5 mg Oral TID WC   rosuvastatin  40 mg Oral Daily   sodium chloride flush  10-40 mL Intracatheter Q12H   Warfarin - Pharmacist Dosing Inpatient   Does not apply q1600    Infusions:  sodium chloride 10 mL/hr at 01/06/22 0901   sodium chloride 10 mL/hr at 01/07/22 2200   ceFEPime (MAXIPIME) IV 2 g (01/12/22 0606)   vancomycin 750 mg (01/11/22 2310)    PRN Medications: sodium chloride, sodium  chloride, ipratropium-albuterol, mouth rinse, oxyCODONE, sodium chloride flush   Assessment/Plan:   1.  Mixed cardiogenic and septic shock: Ejection fraction 10% with severe RV hypokinesis.  Currently on vancomycin and cefepime.  Off inotropic support.  Continue with current management for heart failure.  2.  Acute on chronic systolic and diastolic heart failure: No obvious volume overload today.  Long-term option is likely transplant.  We Keatyn Luck continue with current therapy and make adjustments to optimal medical therapy when closer to discharge.  3.  Mechanical mitral valve replacement: Currently on IV antibiotics due to endocarditis per infectious disease.  Shekina Cordell need 6 weeks worth of antibiotics.  PICC line in place.  INR goal of 2.5-3.5.  Continue Lovenox until INR therapeutic.  4.  Paroxysmal atrial fibrillation: Remains in sinus rhythm.  Continue amiodarone.  5.  Acute renal failure: Creatinine is back to baseline  6.  Multiple right MCA territory ischemic strokes: Likely due to endocarditis.  Neurology has been consulted.  Continued left upper extremity weakness  7.  Left upper arm abscess: Potentially a complication of his endocarditis.  I spoken with heart failure cardiology about the possibility of surgery to assess his abscess.  He could safely be treated with drainage of the abscess under conscious sedation.  While his INR needs to be elevated, he did not have a stroke caused by thrombus, was likely caused by his septic emboli from vegetation and endocarditis.  It is possible to let his INR drift down to a comfortable level per surgery so that they can operate on his abscess.  I Nalany Steedley discuss this further with the surgical team.  HF medications at discharge: Aspirin 81 mg daily Amiodarone 200 mg daily Farxiga 10 mg daily Digoxin 0.125 mg daily Midodrine 2.5 mg TID Lasix 40 mg daily Potassium chloride 40 mEQ daily Do not resume losartan or spiro  IV Vancomycin and Cefepime per  ID orders.   Length of Stay: 15  Johnmichael Melhorn Jorja Loa, MD  10:38 AM   01/12/2022, 10:38 AM  Advanced Heart Failure Team Pager [phone] (M-F; 7a - 4p)  Please contact [facility] for night-coverage after hours (4p -7a ) and weekends on amion.

## 2022-01-12 NOTE — Progress Notes (Signed)
ANTICOAGULATION CONSULT NOTE  Pharmacy Consult for enoxaparin>warfarin Indication:  MVR /AFib  No Known Allergies  Patient Measurements: Height: 5\' 10"  (177.8 cm) Weight: 88.3 kg (194 lb 10.7 oz) IBW/kg (Calculated) : 73 Heparin Dosing Weight: 90.7 kg  Vital Signs: Temp: 98.3 F (36.8 C) (07/30 1143) Temp Source: Oral (07/30 1143) BP: 118/90 (07/30 1143) Pulse Rate: 80 (07/30 1143)  Labs: Recent Labs    01/10/22 0505 01/11/22 0452 01/12/22 0125  HGB 10.2* 9.8* 9.3*  HCT 31.4* 30.7* 29.2*  PLT 380 365 383  LABPROT 22.5* 28.6* 29.8*  INR 2.0* 2.7* 2.9*  CREATININE 0.77 0.78 0.82  CKTOTAL 180  --   --      Estimated Creatinine Clearance: 143.4 mL/min (by C-G formula based on SCr of 0.82 mg/dL).   Medical History: Past Medical History:  Diagnosis Date   Anemia    Autoimmune disorder (HCC)    pyoderma gangrenosum   CHF (congestive heart failure) (HCC)    Chronic systolic heart failure (HCC)    a. EF 35-40% by echo in 07/2018 b. EF at 45% by repeat echo in 04/2020   DVT (deep venous thrombosis) (HCC)    h/o   Dysrhythmia    Mitral regurgitation    a. 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 b. 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HCC)    arthritis   Shock, septic and cardiogenic 12/28/2021   Tricuspid regurgitation    Assessment: 35 yom with a history of mitral regurgitation s/p mechanical valve replacement on Warfarin, HF, AF and prior DVT, and Hx of hidradenitis and pyoderma gangrenosum .   Patient taking warfarin PTA 4mg  daily per last AC note.  Patient changed to full dose lovenox 7/26. INR trended up to 2.7 on 7/29 and remains therapeutic on 7/30 (2.9). No signs or symptoms of bleeding documented.    Patient continues on  broad antibiotics for non speciated endocarditis.   Goal of Therapy:  INR goal 2.5-3.5 Monitor platelets by anticoagulation protocol: Yes   Plan:  Discontinue enoxaparin 80mg  q12h Hold warfarin today given plan for IR procedure  F/u plans to resume warfarin post-IR  Continue to monitor daily CBC, INR, signs/symptoms of bleeding, and SCr  Recommendations for discharge: warfarin 7.5mg  QHS   8/29, PharmD PGY1 Pharmacy Resident  7/30/20231:46 PM

## 2022-01-12 NOTE — Progress Notes (Signed)
ANTICOAGULATION CONSULT NOTE  Pharmacy Consult for enoxaparin>warfarin Indication:  MVR /AFib  No Known Allergies  Patient Measurements: Height: 5\' 10"  (177.8 cm) Weight: 88.3 kg (194 lb 10.7 oz) IBW/kg (Calculated) : 73 Heparin Dosing Weight: 90.7 kg  Vital Signs: Temp: 97.7 F (36.5 C) (07/30 0839) Temp Source: Oral (07/30 0839) BP: 89/60 (07/30 0839) Pulse Rate: 79 (07/30 0839)  Labs: Recent Labs    01/10/22 0505 01/11/22 0452 01/12/22 0125  HGB 10.2* 9.8* 9.3*  HCT 31.4* 30.7* 29.2*  PLT 380 365 383  LABPROT 22.5* 28.6* 29.8*  INR 2.0* 2.7* 2.9*  CREATININE 0.77 0.78 0.82  CKTOTAL 180  --   --      Estimated Creatinine Clearance: 143.4 mL/min (by C-G formula based on SCr of 0.82 mg/dL).   Medical History: Past Medical History:  Diagnosis Date   Anemia    Autoimmune disorder (HCC)    pyoderma gangrenosum   CHF (congestive heart failure) (HCC)    Chronic systolic heart failure (HCC)    a. EF 35-40% by echo in 07/2018 b. EF at 45% by repeat echo in 04/2020   DVT (deep venous thrombosis) (HCC)    h/o   Dysrhythmia    Mitral regurgitation    a. 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 b. 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HCC)    arthritis   Shock, septic and cardiogenic 12/28/2021   Tricuspid regurgitation    Assessment: 35 yom with a history of mitral regurgitation s/p mechanical valve replacement on Warfarin, HF, AF and prior DVT, and Hx of hidradenitis and pyoderma gangrenosum .   Patient taking warfarin PTA 4mg  daily per last AC note.  Patient changed to full dose lovenox 7/26. INR trended up to 2.7 on 7/29 and remains therapeutic on 7/30 (2.9). No signs or symptoms of bleeding documented.    Patient continues on broad  antibiotics for non speciated endocarditis.   Goal of Therapy:  INR goal 2.5-3.5 Monitor platelets by anticoagulation protocol: Yes   Plan:  Discontinue enoxaparin 80mg  q12h Warfarin 7.5mg  today  Continue to monitor daily CBC, INR, signs/symptoms of bleeding, and SCr  Recommendations for discharge: warfarin 7.5mg  QHS   8/29, PharmD PGY1 Pharmacy Resident  7/30/202311:03 AM

## 2022-01-12 NOTE — Progress Notes (Signed)
Daily Progress Note Intern Pager: [phone]  Patient name: Donald [name] Medical record number: [id number] Date of birth: 1985-10-08 Age: 36 y.o. Gender: male  Primary Care Provider: No primary care provider on file. Consultants: Pulmonary critical care, ID, cardiology, heart failure team, pharmacy Code Status: Full   Pt Overview and Major Events to Date:  -7/14 admitted FPTS for sepsis, N/V. Started on flagyl, cefepime, vanc -7/15 worsening shock> ICU> intubated, pressors, abx. Flagyl stopped -7/16 decreasing milrinone for high CO and co-ox -7/19 extubated -7/21 drop in cardiac index, Levophed restarted, Swan discontinued -7/22 levophed stopped -7/23 milrinone stopped -7/24 out to FMTS -7/27 acute stroke  and swelling of L upper arm, CT w/ pyomyositis   Assessment and Plan: Donald [name] is a 36 y.o. 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 Pertinent includes MR s/p MV mechanical valve on warfarin, biventricular HF, and PAF.   * Endocarditis of prosthetic mitral valve (HCC) Stable, afebrile last 24 hours.  Continue vancomycin and cefepime through PICC.  Patient is set up to go home with home antibiotic infusions.  End date 8/27. - INR now therapeutic at 2.9, f/u pharmacy/cards recs on continued anticoagulation for mechanical valve  Pyomyositis of upper left arm CT with contrast concerning for cellulitis and pyomyositis of left axilla extending to coracobrachialis.  Surgery consulted, attempted bedside aspiration without success, decline further surgical management at this time given high risk on anticoagulation. -Will consult IR to assess further drainage options of large fluid collection -Will f/u with ID about altering abx regimen to cover for anaerobes  Acute embolic stroke (HCC) Stable.  CTA head/neck negative for aneurysm, so stroke team recommended to continue current antithrombotics. Crestor 40 mg  daily started 7/28.  Stroke team signed off, plan to follow-up with stroke clinic NP in [facility] in about 4 weeks.  Paroxysmal atrial fibrillation (HCC) Stable, rate controlled. -Continue amiodarone 200 mg daily -Cardiology following, appreciate conditions.  Chronic heart failure (HCC) Stable at this time.  Lasix held per heart failure team.  - Continue digoxin and Farxiga - Wean midodrine per heart failure team - Appreciate heart failure team care and recommendations  Hypomagnesemia Mg 1.6. -2g IV Mg given -AM Mg  Hyponatremia Na 128 and stable. Suspect decreased PO intake with potentially increased ADH secretion. -AM BMP  FEN/GI: Heart healthy PPx: Aspirin and warfarin with Lovenox bridge Dispo:Home pending clinical improvement. Likely 1-2 days if improvement in pyomyositis picture on abx.  Subjective:  He feels worse this morning. It is difficult to move his LUE at all.  Objective: Temp:  [97.5 F (36.4 C)-98.6 F (37 C)] 98.1 F (36.7 C) (07/30 0322) Pulse Rate:  [75-81] 80 (07/30 0322) Resp:  [12-20] 16 (07/30 0322) BP: (99-127)/(72-93) 99/72 (07/30 0322) SpO2:  [98 %-100 %] 99 % (07/30 0322) Weight:  [88.3 kg] 88.3 kg (07/30 0324) Physical Exam: General: Alert and oriented, in NAD Skin: Warm, dry HEENT: NCAT, EOM grossly normal, midline nasal septum Cardiac: Regular rate Respiratory: Breathing and speaking comfortably on RA Abdominal: Nondistended Extremities: LUE swollen around the biceps and firm to touch, painful to minimal passive movement at elbow and shoulder, nontender to palpation, marked weakness of active movement Psychiatric: Appropriate mood and affect   Laboratory: Most recent CBC Lab Results  Component Value Date   WBC 8.3 01/12/2022   HGB 9.3 (L) 01/12/2022   HCT 29.2 (L) 01/12/2022   MCV 81.6 01/12/2022  PLT 383 01/12/2022   Most recent BMP    Latest Ref Rng & Units 01/12/2022    1:25 AM  BMP  Glucose 70 -  99 mg/dL 829   BUN 6 - 20 mg/dL 15   Creatinine 9.37 - 1.24 mg/dL 1.69   Sodium 678 - 938 mmol/L 128   Potassium 3.5 - 5.1 mmol/L 4.0   Chloride 98 - 111 mmol/L 100   CO2 22 - 32 mmol/L 23   Calcium 8.9 - 10.3 mg/dL 8.8    Mg 1.6 INR 2.9  [name], MD 01/12/2022, 8:04 AM PGY-1, [facility] FPTS Intern pager: [phone], text pages welcome Secure chat group [facility] Teaching Service

## 2022-01-13 ENCOUNTER — Inpatient Hospital Stay (HOSPITAL_COMMUNITY): Payer: Medicaid Other

## 2022-01-13 ENCOUNTER — Encounter (HOSPITAL_COMMUNITY): Payer: Self-pay | Admitting: Family Medicine

## 2022-01-13 DIAGNOSIS — Z7901 Long term (current) use of anticoagulants: Secondary | ICD-10-CM | POA: Diagnosis not present

## 2022-01-13 DIAGNOSIS — I480 Paroxysmal atrial fibrillation: Secondary | ICD-10-CM | POA: Diagnosis not present

## 2022-01-13 DIAGNOSIS — I5043 Acute on chronic combined systolic (congestive) and diastolic (congestive) heart failure: Secondary | ICD-10-CM | POA: Diagnosis not present

## 2022-01-13 DIAGNOSIS — M79602 Pain in left arm: Secondary | ICD-10-CM | POA: Diagnosis not present

## 2022-01-13 DIAGNOSIS — T826XXA Infection and inflammatory reaction due to cardiac valve prosthesis, initial encounter: Secondary | ICD-10-CM | POA: Diagnosis not present

## 2022-01-13 DIAGNOSIS — I38 Endocarditis, valve unspecified: Secondary | ICD-10-CM | POA: Diagnosis not present

## 2022-01-13 DIAGNOSIS — N179 Acute kidney failure, unspecified: Secondary | ICD-10-CM | POA: Diagnosis not present

## 2022-01-13 DIAGNOSIS — L02414 Cutaneous abscess of left upper limb: Secondary | ICD-10-CM | POA: Diagnosis not present

## 2022-01-13 DIAGNOSIS — L88 Pyoderma gangrenosum: Secondary | ICD-10-CM | POA: Diagnosis not present

## 2022-01-13 LAB — CBC
HCT: 27.1 % — ABNORMAL LOW (ref 39.0–52.0)
Hemoglobin: 8.8 g/dL — ABNORMAL LOW (ref 13.0–17.0)
MCH: 26.3 pg (ref 26.0–34.0)
MCHC: 32.5 g/dL (ref 30.0–36.0)
MCV: 81.1 fL (ref 80.0–100.0)
Platelets: 347 10*3/uL (ref 150–400)
RBC: 3.34 MIL/uL — ABNORMAL LOW (ref 4.22–5.81)
RDW: 17.6 % — ABNORMAL HIGH (ref 11.5–15.5)
WBC: 8.9 10*3/uL (ref 4.0–10.5)
nRBC: 0 % (ref 0.0–0.2)

## 2022-01-13 LAB — BASIC METABOLIC PANEL
Anion gap: 7 (ref 5–15)
BUN: 15 mg/dL (ref 6–20)
CO2: 23 mmol/L (ref 22–32)
Calcium: 8.9 mg/dL (ref 8.9–10.3)
Chloride: 101 mmol/L (ref 98–111)
Creatinine, Ser: 0.72 mg/dL (ref 0.61–1.24)
GFR, Estimated: >60 mL/min (ref >60)
Glucose, Bld: 90 mg/dL (ref 70–99)
Potassium: 3.8 mmol/L (ref 3.5–5.1)
Sodium: 131 mmol/L — ABNORMAL LOW (ref 135–145)

## 2022-01-13 LAB — PROTIME-INR
INR: 2.5 — ABNORMAL HIGH (ref 0.8–1.2)
Prothrombin Time: 26.7 seconds — ABNORMAL HIGH (ref 11.4–15.2)

## 2022-01-13 LAB — MAGNESIUM: Magnesium: 1.3 mg/dL — ABNORMAL LOW (ref 1.7–2.4)

## 2022-01-13 MED ORDER — MIDAZOLAM HCL 2 MG/2ML IJ SOLN
INTRAMUSCULAR | Status: AC
  Filled 2022-01-13: qty 2

## 2022-01-13 MED ORDER — FENTANYL CITRATE (PF) 100 MCG/2ML IJ SOLN
INTRAMUSCULAR | Status: AC
  Filled 2022-01-13: qty 2

## 2022-01-13 MED ORDER — MAGNESIUM SULFATE 2 GM/50ML IV SOLN
2.0000 g | Freq: Once | INTRAVENOUS | Status: DC
Start: 2022-01-13 — End: 2022-01-14

## 2022-01-13 MED ORDER — MIDAZOLAM HCL 2 MG/2ML IJ SOLN
INTRAMUSCULAR | Status: AC | PRN
  Administered 2022-01-13 (×2): 1 mg via INTRAVENOUS

## 2022-01-13 MED ORDER — LIDOCAINE HCL (PF) 1 % IJ SOLN
INTRAMUSCULAR | Status: AC
  Filled 2022-01-13: qty 30

## 2022-01-13 MED ORDER — MAGNESIUM SULFATE 2 GM/50ML IV SOLN
2.0000 g | Freq: Once | INTRAVENOUS | Status: AC
Start: 2022-01-13 — End: 2022-01-13
  Administered 2022-01-13: 2 g via INTRAVENOUS
  Filled 2022-01-13: qty 50

## 2022-01-13 MED ORDER — SPIRONOLACTONE 12.5 MG HALF TABLET
12.5000 mg | ORAL_TABLET | Freq: Every day | ORAL | Status: DC
Start: 2022-01-13 — End: 2022-01-14
  Administered 2022-01-13 – 2022-01-14 (×2): 12.5 mg via ORAL
  Filled 2022-01-13 (×2): qty 1

## 2022-01-13 MED ORDER — WARFARIN SODIUM 5 MG PO TABS
6.0000 mg | ORAL_TABLET | ORAL | Status: AC
  Administered 2022-01-13: 6 mg via ORAL
  Filled 2022-01-13: qty 1

## 2022-01-13 MED ORDER — FENTANYL CITRATE (PF) 100 MCG/2ML IJ SOLN
INTRAMUSCULAR | Status: AC | PRN
  Administered 2022-01-13 (×2): 50 ug via INTRAVENOUS

## 2022-01-13 NOTE — Progress Notes (Signed)
OT Cancellation Note  Patient Details Name: Donald [name] MRN: [id number] DOB: 08/10/1985   Cancelled Treatment:    Reason Eval/Treat Not Completed: Patient declined, no reason specified (Patient states he did not want to try anything at this time due to LUE pain.  Will attempt later as schedule permits) Alfonse Flavors, OTA Acute Rehabilitation Services  Office [phone]  Dewain Penning 01/13/2022, 8:59 AM

## 2022-01-13 NOTE — Progress Notes (Signed)
Seen pt from 1019-1026, pt declined ambulation due to tiredness. Pt mom told me that she was able to help him to the bathroom and he's walking "fine". Pt is open to ambulating later.   Donald [name] 01/13/2022 10:29 AM

## 2022-01-13 NOTE — Progress Notes (Signed)
ANTICOAGULATION CONSULT NOTE  Pharmacy Consult for enoxaparin>warfarin Indication:  MVR /AFib  No Known Allergies  Patient Measurements: Height: 5\' 10"  (177.8 cm) Weight: 90.1 kg (198 lb 10.2 oz) IBW/kg (Calculated) : 73 Heparin Dosing Weight: 90.7 kg  Vital Signs: Temp: 98.4 F (36.9 C) (07/31 0807) Temp Source: Oral (07/31 0807) BP: 152/87 (07/31 1550) Pulse Rate: 78 (07/31 1550)  Labs: Recent Labs    01/11/22 0452 01/12/22 0125 01/13/22 0430  HGB 9.8* 9.3* 8.8*  HCT 30.7* 29.2* 27.1*  PLT 365 383 347  LABPROT 28.6* 29.8* 26.7*  INR 2.7* 2.9* 2.5*  CREATININE 0.78 0.82 0.72     Estimated Creatinine Clearance: 148.2 mL/min (by C-G formula based on SCr of 0.72 mg/dL).    Assessment: 31 yom with a history of mitral regurgitation s/p mechanical valve replacement on Warfarin, HF, AF and prior DVT, and Hx of hidradenitis and pyoderma gangrenosum .   Patient taking warfarin PTA 4mg  daily per last AC note.  Per discussion with PA earlier today, OK to give Coumadin tonight since patient went for the procedure.  INR therapeutic at 2.5.  Noted Coumadin was held yesterday.  Goal of Therapy:  INR goal 2.5-3.5 Monitor platelets by anticoagulation protocol: Yes   Plan:  Coumadin 6mg  PO today Daily PT / INR  Nashira Mcglynn D. , PharmD, BCPS, BCCCP 01/13/2022, 4:17 PM

## 2022-01-13 NOTE — Progress Notes (Signed)
Left arm at bicep is 15.5 inches around.

## 2022-01-13 NOTE — Progress Notes (Addendum)
Daily Progress Note Intern Pager: [phone]  Patient name: Donald [name] Medical record number: [id number] Date of birth: 1985/12/02 Age: 36 y.o. Gender: male  Primary Care Provider: No primary care provider on file. Consultants: Pulmonary critical care, ID, cardiology, heart failure team, pharmacy Code Status: Full   Pt Overview and Major Events to Date:  -7/14 admitted [facility] for sepsis, N/V. Started on flagyl, cefepime, vanc -7/15 worsening shock> ICU> intubated, pressors, abx. Flagyl stopped -7/16 decreasing milrinone for high CO and co-ox -7/19 extubated -7/21 drop in cardiac index, Levophed restarted, Swan discontinued -7/22 levophed stopped -7/23 milrinone stopped -7/24 out to [facility] -7/27 acute stroke  and swelling of L upper arm, CT w/ pyomyositis   Assessment and Plan: Donald [name] is a 36 y.o. 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 Pertinent includes MR s/p MV mechanical valve on warfarin, biventricular HF, and PAF.   * Endocarditis of prosthetic mitral valve (HCC) Stable, afebrile. Continue vancomycin and cefepime through PICC.  Patient is set up to go home with home antibiotic infusions. End date 8/27. 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 Surgery consulted, attempted bedside aspiration without success, decline further surgical management at this time given high risk on anticoagulation. IR following, will perform aspiration once INR is appropriate. Suspect lack of movement 2/2 pain, potentially local neurologic block of swelling. -F/u IR recs, INR -Continue broad spectrum abx as above, discussed regimen with ID 7/30  Acute embolic  stroke (HCC) Stable.  CTA head/neck negative for aneurysm, so stroke team recommended to continue current antithrombotics. Crestor 40 mg daily started 7/28.   -Stroke team signed off, plan to follow-up with stroke clinic NP in [facility] in about 4 weeks.  Paroxysmal atrial fibrillation (HCC) Stable, rate controlled. -Continue amiodarone 200 mg daily -Cardiology following, appreciate conditions.  Chronic heart failure (HCC) Stable at this time.  Lasix held per heart failure team.  - Continue digoxin and Farxiga - Wean midodrine per heart failure team - Appreciate heart failure team care and recommendations  Hypomagnesemia Mg 1.3. -4g IV Mg given -AM Mg  FEN/GI: Heart healthy PPx: Holding for now in preparation for IR drainage Dispo:Home pending clinical improvement. Likely 1-2 days if improvement in pyomyositis picture on abx and drainage.  Subjective:  Doing well this morning. He still cannot move his arm, and it is still very painful. He is ready to have IR drain some fluid.  Objective: Temp:  [97.7 F (36.5 C)-98.6 F (37 C)] 98.4 F (36.9 C) (07/31 0807) Pulse Rate:  [77-84] 77 (07/31 0807) Resp:  [13-20] 16 (07/31 0807) BP: (88-166)/(60-104) 166/104 (07/31 0807) SpO2:  [97 %-100 %] 99 % (07/31 0807) Weight:  [90.1 kg] 90.1 kg (07/31 0349) Physical Exam: General: Alert and oriented, in NAD Skin: Warm, dry HEENT: NCAT, EOM grossly normal, midline nasal septum Cardiac: Regular rate Respiratory: Breathing and speaking comfortably on RA Abdominal: Nondistended Extremities: LUE unable to move, swollen around bicep, firm to palpation with some tenderness on medial side Psychiatric: Appropriate mood and affect   Laboratory: Most recent CBC Lab Results  Component Value Date   WBC 8.9 01/13/2022   HGB 8.8 (L) 01/13/2022  HCT 27.1 (L) 01/13/2022   MCV 81.1 01/13/2022   PLT 347 01/13/2022   Most recent BMP    Latest Ref Rng & Units 01/13/2022     4:30 AM  BMP  Glucose 70 - 99 mg/dL 90   BUN 6 - 20 mg/dL 15   Creatinine 6.29 - 1.24 mg/dL 5.28   Sodium 413 - 244 mmol/L 131   Potassium 3.5 - 5.1 mmol/L 3.8   Chloride 98 - 111 mmol/L 101   CO2 22 - 32 mmol/L 23   Calcium 8.9 - 10.3 mg/dL 8.9    INR 2.5 Mg 1.3  [name], MD 01/13/2022, 8:14 AM PGY-1, [facility] FPTS Intern pager: [phone], text pages welcome Secure chat group [facility] Teaching Service

## 2022-01-13 NOTE — Consult Note (Signed)
Chief Complaint: Patient was seen in consultation today for left upper extremity fluid collection aspiration Chief Complaint  Patient presents with   Weakness   at the request of Dr Eloise Harman; Dr Sherrill Raring  Supervising Physician: Daryll Brod  Patient Status: [facility] - In-pt  History of Present Illness: Donald [name] is a 36 y.o. male   Hx autoimmune disease; pyoderma gangrenosum; A fib; MV replacement 2019; CHF Cardiopulmonary bypass at [facility] 05/2021 Presented to ED 7/23 with N/V/D and fever; sepsis Dx mitral valve endocarditis Developed left arm pain 7/27-- Left axillary abscess-- surgery attempted aspiration 7/28; without success  CT LUE 7/28: IMPRESSION: Findings most consistent with cellulitis and pyomyositis involving the left axilla and anterior compartment of the left upper arm. Large collection is in the left axilla and extends into the coracobrachialis as described above. MRI with IV contrast is a more sensitive test for detection pyomyositis.  Request made for aspiration in IR Dr Annamaria Boots has reviewed imaging and approves procedure INR 2.5 today   Past Medical History:  Diagnosis Date   Anemia    Autoimmune disorder ([place])    pyoderma gangrenosum   CHF (congestive heart failure) ([place])    Chronic systolic heart failure ([place])    a. EF 35-40% by echo in 07/2018 b. EF at 45% by repeat echo in 04/2020   DVT (deep venous thrombosis) (HCC)    h/o   Dysrhythmia    Mitral regurgitation    a. 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 b. 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HCC)    arthritis   Shock, septic and cardiogenic 12/28/2021   Tricuspid regurgitation     Past Surgical History:  Procedure Laterality Date   CARDIAC  CATHETERIZATION     HERNIA REPAIR Right 2018   MITRAL VALVE REPAIR  06/07/2018   [place] N/A 06/23/2021   MULTIPLE EXTRACTIONS WITH ALVEOLOPLASTY N/A 11/08/2020   Procedure: EXTRACTION OF TEETH NUMBER ONE, FIFTHTEEN, SIXTEEN, SEVENTEEN, EIGHTTEEN AND THRTY WITH ALVEOLOPLASTY OF LOWER LEFT QUADRANT.;  Surgeon: Charlaine Dalton, DMD;  Location: [facility];  Service: Dentistry;  Laterality: N/A;   RIGHT/LEFT HEART CATH AND CORONARY ANGIOGRAPHY N/A 09/11/2020   Procedure: RIGHT/LEFT HEART CATH AND CORONARY ANGIOGRAPHY;  Surgeon: Jolaine Artist, MD;  Location: [facility];  Service: Cardiovascular;  Laterality: N/A;   TEE WITHOUT CARDIOVERSION N/A 05/23/2020   Procedure: TRANSESOPHAGEAL ECHOCARDIOGRAM (TEE) WITH PROPOFOL;  Surgeon: Arnoldo Lenis, MD;  Location[facility];  Service: Endoscopy;  Laterality: N/A;    Allergies: Patient has no known allergies.  Medications: Prior to Admission medications   Medication Sig Start Date End Date Taking? Authorizing Provider  acetaminophen (TYLENOL) 325 MG tablet Take 2 tablets (650 mg total) by mouth every 4 (four) hours as needed for headache or mild pain. 06/04/21  Yes Clegg, Amy D, NP  amiodarone (PACERONE) 200 MG tablet TAKE 1 TABLET BY MOUTH TWICE DAILY FOR  4  WEEKS,  THEN  DECREASE  TO  1  TABLET  DAILY 12/05/21  Yes Branch, Alphonse Guild, MD  ceFEPime (MAXIPIME) IVPB Inject 2 g into the vein every 8 (eight) hours. 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 01/08/22 02/08/22 Yes Leeanne Rio, MD   dapagliflozin propanediol (FARXIGA) 10 MG TABS tablet Take 1 tablet (10 mg total) by mouth daily. 10/09/21  Yes Bensimhon, Shaune Pascal, MD  furosemide (LASIX) 40 MG tablet Take 1 tablet (40 mg total) by mouth daily. 11/18/21  Yes Bensimhon, Shaune Pascal, MD  furosemide (LASIX) 40 MG tablet Take 1 tablet (40 mg total) by mouth daily as needed. As directed by heart failure clinic. Take potassium on days you take Lasix. 01/09/22 01/09/23 Yes Ezequiel Essex, MD  losartan (COZAAR) 25 MG tablet Take 1 tablet (25 mg total) by mouth daily. 10/11/21  Yes Bensimhon, Shaune Pascal, MD  pantoprazole (PROTONIX) 40 MG tablet Take 1 tablet by mouth once daily 11/13/21  Yes Larey Dresser, MD  potassium chloride SA (KLOR-CON M) 20 MEQ tablet Take 2 tablets (40 mEq total) by mouth daily. 10/11/21  Yes Bensimhon, Shaune Pascal, MD  potassium chloride SA (KLOR-CON M) 20 MEQ tablet Take 2 tablets (40 mEq total) by mouth daily as needed. Take potassium on days you take Lasix. 01/09/22  Yes Ezequiel Essex, MD  spironolactone (ALDACTONE) 25 MG tablet Take 1 tablet (25 mg total) by mouth daily. Patient taking differently: Take 12.5 mg by mouth daily. 11/13/21  Yes Bensimhon, Shaune Pascal, MD  vancomycin IVPB Inject 750 mg into the vein every 12 (twelve) hours. 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 Labs - Thursday:  BMP and vancomycin trough Labs - Every other week:  ESR and CRP Method of administration:Elastomeric Method of administration may be changed at the discretion of the patient and/or caregiver's ability to self-administer the medication ordered. 01/08/22 02/07/22 Yes Leeanne Rio, MD  amiodarone (PACERONE) 200 MG tablet Take 1 tablet (200 mg total) by mouth daily. 01/10/22   Ezequiel Essex, MD  aspirin 81 MG chewable tablet Chew 1 tablet (81 mg total) by mouth daily. 01/10/22   Ezequiel Essex, MD   dapagliflozin propanediol (FARXIGA) 10 MG TABS tablet Take 1 tablet (10 mg total) by mouth daily. 01/10/22   Ezequiel Essex, MD  digoxin (LANOXIN) 0.125 MG tablet Take 1 tablet (0.125 mg total) by mouth daily. 01/10/22   Ezequiel Essex, MD  doxycycline (VIBRAMYCIN) 100 MG capsule Take 1 capsule (100 mg total) by mouth 2 (two) times daily. Patient not taking: Reported on 12/27/2021 11/03/21   Blanchie Dessert, MD  enoxaparin (LOVENOX) 80 MG/0.8ML injection Inject 0.8 mLs (80 mg total) into the skin every 12 (twelve) hours. Adjust as directed by heart failure and warfarin clinic. To be used only until warfarin is at correct level. 01/09/22   Ezequiel Essex, MD  feeding supplement (ENSURE ENLIVE / ENSURE PLUS) LIQD Take 237 mLs by mouth 3 (three) times daily between meals. 01/09/22   Ezequiel Essex, MD  midodrine (PROAMATINE) 2.5 MG tablet Take 1 tablet (2.5 mg total) by mouth 3 (three) times daily with meals. 01/09/22   Ezequiel Essex, MD  oxyCODONE-acetaminophen (PERCOCET/ROXICET) 5-325 MG tablet Take 1 tablet by mouth every 6 (six) hours as needed for severe pain.  Patient not taking: Reported on 12/27/2021 11/03/21   Blanchie Dessert, MD  predniSONE (DELTASONE) 20 MG tablet Take 2 tablets (40 mg total) by mouth daily. Take 2 tablets (40 mg) daily for the next 5 days, then take 1 tablet (20 mg) daily for 5 days, then take 0.5 tablet (34m) for 4 days Patient not taking: Reported on 12/27/2021 11/03/21   PBlanchie Dessert MD  warfarin (COUMADIN) 2 MG tablet Take 6 mg (3 tablets) on 7/28 then 4 mg (2 tablets) daily until your coumadin clinic appointment on 7/31. 01/09/22   LEzequiel Essex MD     Family History  Problem Relation Age of Onset   Multiple sclerosis Mother    Psoriasis Mother    Depression Father    Diabetes Father    Diabetes Paternal Grandmother     Social History   Socioeconomic History   Marital status: Divorced    Spouse name: Not on file   Number of children: Not on file    Years of education: Not on file   Highest education level: Not on file  Occupational History   Not on file  Tobacco Use   Smoking status: Former    Types: Cigarettes    Quit date: 10/14/2020    Years since quitting: 1.2   Smokeless tobacco: Never  Vaping Use   Vaping Use: Never used  Substance and Sexual Activity   Alcohol use: No   Drug use: No   Sexual activity: Yes  Other Topics Concern   Not on file  Social History Narrative   Not on file   Social Determinants of Health   Financial Resource Strain: Not on file  Food Insecurity: Not on file  Transportation Needs: Not on file  Physical Activity: Not on file  Stress: Not on file  Social Connections: Not on file    Review of Systems: A 12 point ROS discussed and pertinent positives are indicated in the HPI above.  All other systems are negative.  Review of Systems  Constitutional:  Positive for activity change, fatigue and fever.  Respiratory:  Negative for cough and shortness of breath.   Gastrointestinal:  Negative for abdominal pain.  Neurological:  Positive for weakness.  Psychiatric/Behavioral:  Negative for behavioral problems and confusion.     Vital Signs: BP (!) 166/104 (BP Location: Left Leg)   Pulse 77   Temp 98.4 F (36.9 C) (Oral)   Resp 16   Ht '5\' 10"'  (1.778 m)   Wt 198 lb 10.2 oz (90.1 kg)   SpO2 99%   BMI 28.50 kg/m     Physical Exam Vitals reviewed.  HENT:     Mouth/Throat:     Mouth: Mucous membranes are moist.  Cardiovascular:     Rate and Rhythm: Normal rate and regular rhythm.     Heart sounds: Normal heart sounds.  Pulmonary:     Effort: Pulmonary effort is normal.     Breath sounds: Normal breath sounds.  Abdominal:     Palpations: Abdomen is soft.  Musculoskeletal:        General: Swelling present.     Comments: LUE swollen and tender to touch  Neurological:     Mental Status: Donald [name] is alert and oriented to person, place, and time.  Psychiatric:        Behavior: Behavior  normal.     Imaging: VAS [place] EXTREM[place] VENOUS DUPLEX  Result Date: 01/10/2022 UPPER VENOUS STUDY  Patient Name:  Donald [name] Date  of Exam:   01/09/2022 Medical Rec #: [id number]            Accession #:    [id number] Date of Birth: [birth year]           Patient Gender: M Patient Age:   75 years Exam Location:  [facility] Procedure:      VAS [place] UPPER EXTREMITY VENOUS DUPLEX Referring Phys: Andrena Mews --------------------------------------------------------------------------------  Indications: Pain, and Swelling Limitations: Patient inability to lie still and constant sitting upright. Comparison Study: No previous exam noted. Performing Technologist: Bobetta Lime BS, RVT  Examination Guidelines: A complete evaluation includes B-mode imaging, spectral Doppler, color Doppler, and power Doppler as needed of all accessible portions of each vessel. Bilateral testing is considered an integral part of a complete examination. Limited examinations for reoccurring indications may be performed as noted.  Right Findings: +----------+------------+---------+-----------+----------+-------+ RIGHT     CompressiblePhasicitySpontaneousPropertiesSummary +----------+------------+---------+-----------+----------+-------+ Subclavian    Full       Yes       Yes                      +----------+------------+---------+-----------+----------+-------+  Left Findings: +----------+------------+---------+-----------+----------+--------------+ LEFT      CompressiblePhasicitySpontaneousProperties   Summary     +----------+------------+---------+-----------+----------+--------------+ IJV           Full       Yes       Yes                             +----------+------------+---------+-----------+----------+--------------+ Subclavian    Full       Yes       Yes                             +----------+------------+---------+-----------+----------+--------------+ Axillary      Full        Yes       Yes                             +----------+------------+---------+-----------+----------+--------------+ Brachial      Full                                                 +----------+------------+---------+-----------+----------+--------------+ Radial        Full                                                 +----------+------------+---------+-----------+----------+--------------+ Ulnar         Full                                                 +----------+------------+---------+-----------+----------+--------------+ Cephalic      Full                                                 +----------+------------+---------+-----------+----------+--------------+ Basilic  Not visualized +----------+------------+---------+-----------+----------+--------------+ The left basilic vein was not visualized.  Incidental finding of a non-vascularized, mostly anechoic mass noted in the shoulder area and extending into the upper arm.  Summary:  Right: No evidence of thrombosis in the subclavian.  Left: No evidence of deep vein thrombosis in the upper extremity. No evidence of superficial vein thrombosis in the upper extremity.  *See table(s) above for measurements and observations.  Diagnosing physician: Jamelle Haring Electronically signed by Jamelle Haring on 01/10/2022 at 2:03:09 PM.    Final    CT ANGIO HEAD NECK W WO CM  Result Date: 01/10/2022 CLINICAL DATA:  Provided history: Stroke/TIA, determine embolic source. EXAM: CT ANGIOGRAPHY HEAD AND NECK TECHNIQUE: Multidetector CT imaging of the head and neck was performed using the standard protocol during bolus administration of intravenous contrast. Multiplanar CT image reconstructions and MIPs were obtained to evaluate the vascular anatomy. Carotid stenosis measurements (when applicable) are obtained utilizing NASCET criteria, using the distal internal carotid diameter as the  denominator. RADIATION DOSE REDUCTION: This exam was performed according to the departmental dose-optimization program which includes automated exposure control, adjustment of the mA and/or kV according to patient size and/or use of iterative reconstruction technique. CONTRAST:  21m OMNIPAQUE IOHEXOL 350 MG/ML SOLN COMPARISON:  Brain MRI 01/09/2022.  Head CT 01/09/2022. FINDINGS: CT HEAD FINDINGS Brain: No age advanced or lobar predominant parenchymal atrophy. Known small acute right frontoparietal lobe infarcts were better appreciated on the brain MRI of 01/09/2022. Background mild chronic small vessel ischemic changes within the cerebral white matter, also better appreciated on yesterday's MRI. Redemonstrated small chronic infarct within the right cerebellar hemisphere. Partially empty sella turcica, a nonspecific finding. There is no acute intracranial hemorrhage. No extra-axial fluid collection. No evidence of an intracranial mass. No midline shift. Vascular: No hyperdense vessel. Skull: No fracture or aggressive osseous lesion. Sinuses/Orbits: No mass or acute finding within the imaged orbits. No significant paranasal sinus disease. Other: Small-volume fluid within the right mastoid air cells. Review of the MIP images confirms the above findings CTA NECK FINDINGS Aortic arch: Common origin of the innominate and left common carotid arteries. The visualized aortic arch is normal in caliber. No hemodynamically significant innominate or proximal subclavian artery stenosis. Right carotid system: CCA and ICA patent within the neck without stenosis or significant atherosclerotic disease. Left carotid system: CCA and ICA patent within the neck without stenosis or significant atherosclerotic disease. Tortuosity of the cervical ICA. Vertebral arteries: Vertebral arteries codominant and patent within the neck without stenosis or significant atherosclerotic disease. Skeleton: No acute fracture or aggressive osseous  lesion. Other neck: No neck mass or cervical lymphadenopathy. Upper chest: Prior median sternotomy. No consolidation within the imaged lung apices. Other: Partially imaged nonspecific edema/stranding within the left axilla. Review of the MIP images confirms the above findings CTA HEAD FINDINGS Anterior circulation: The intracranial internal carotid arteries are patent. The M1 middle cerebral arteries are patent. No M2 proximal branch occlusion or high-grade proximal stenosis. The anterior cerebral arteries are patent. No intracranial aneurysm is identified. Posterior circulation: The intracranial vertebral arteries are patent. The basilar artery is patent. The posterior cerebral arteries are patent. Posterior communicating arteries are present bilaterally. Venous sinuses: Within the limitations of contrast timing, no convincing thrombus. Anatomic variants: As described. Other: Ovoid cystic-appearing lesions within the left temporal and left preauricular soft tissues measuring up to 1.7 cm. Review of the MIP images confirms the above findings IMPRESSION: CT head: 1. Known small right frontoparietal lobe acute infarcts were  better appreciated on the brain MRI of 01/09/2022. 2. Background mild chronic small vessel image changes within the cerebral white matter. 3. Redemonstrated small chronic infarcts within the right cerebellar hemisphere. 4. Small right mastoid effusion. CTA neck: 1. The common carotid, internal carotid and vertebral arteries are patent within the neck without stenosis or significant atherosclerotic disease. 2. Partially imaged nonspecific edema/stranding within the left axilla. Correlate clinically and consider dedicated imaging. CTA head: 1. No intracranial large vessel occlusion or proximal high-grade arterial stenosis. 2. Cystic-appearing lesions within the left temporal and preauricular soft tissues, measuring up to 1.7 cm. Direct visualization recommended. Electronically Signed   By: Kellie Simmering D.O.   On: 01/10/2022 10:13   CT Extrem Up Entire Arm L W/CM  Result Date: 01/10/2022 CLINICAL DATA:  Soft tissue mass in the right upper arm. EXAM: CT OF THE UPPER LEFT EXTREMITY WITH CONTRAST TECHNIQUE: Multidetector CT imaging of the upper left extremity was performed according to the standard protocol following intravenous contrast administration. RADIATION DOSE REDUCTION: This exam was performed according to the departmental dose-optimization program which includes automated exposure control, adjustment of the mA and/or kV according to patient size and/or use of iterative reconstruction technique. CONTRAST:  75 mL OMNIPAQUE IOHEXOL 350 MG/ML SOLN COMPARISON:  None Available. FINDINGS: Bones/Joint/Cartilage No acute bony or joint abnormality is identified. No focal bone lesion is seen. 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 No joint effusion is identified. Ligaments Suboptimally assessed by CT. Muscles and Tendons There is a fluid collection in the left axilla which extends into the coracobrachialis muscle. The collection measures approximately 4 cm transverse by 3 cm AP by 6.5 cm craniocaudal. Although visualization is somewhat limited as the patient's arm is at his side, an additional fluid collection is seen in the brachialis muscle which measures approximately 2.7 by 2.2 by 3.2 cm. Just lateral to this collection a third smaller collection measuring 1.4 by 1.4 cm in the axial plane is identified. There is stranding in the soft tissues in the anterior aspect of the left upper arm and left axilla. Soft tissues Imaged left lung parenchyma demonstrates mild atelectasis. Marked cardiomegaly is partially visualized. IMPRESSION: Findings most consistent with cellulitis and pyomyositis involving the left axilla and anterior compartment of the left upper arm. Large collection is in the left axilla and extends into the  coracobrachialis as described above. MRI with IV contrast is a more sensitive test for detection pyomyositis. Electronically Signed   By: Inge Rise M.D.   On: 01/10/2022 10:06   MR BRAIN WO CONTRAST  Result Date: 01/09/2022 CLINICAL DATA:  Initial evaluation for neuro deficit, stroke suspected. EXAM: MRI HEAD WITHOUT CONTRAST TECHNIQUE: Multiplanar, multiecho pulse sequences of the brain and surrounding structures were obtained without intravenous contrast. COMPARISON:  Head CT from earlier the same day. FINDINGS: Brain: Cerebral volume within normal limits. Mild hazy and patchy T2/FLAIR signal intensity seen involving the periventricular and deep white matter both cerebral hemispheres, nonspecific, but most characteristic of chronic small vessel ischemic disease, advanced for age. Ischemia. Small remote right cerebellar infarct noted. Patchy small volume foci of restricted diffusion seen involving the cortical subcortical right frontoparietal region (series 15, images 85, 84, 81), consistent with small acute ischemic infarcts. These are likely embolic in nature. No associated hemorrhage or mass effect. No other evidence for acute or subacute ischemia. No acute intracranial hemorrhage. Multiple scattered chronic micro hemorrhages noted involving the cerebellum and both cerebral  hemispheres, nonspecific, but favored to be hypertensive in nature. No mass lesion, midline shift or mass effect. No hydrocephalus or extra-axial fluid collection. Pituitary gland and suprasellar region within normal limits. Vascular: Major intracranial vascular flow voids are maintained. Skull and upper cervical spine: Craniocervical junction within normal limits. Bone marrow signal intensity diffusely decreased on T1 weighted sequence, nonspecific, but most commonly related to anemia, smoking or obesity. No focal marrow replacing lesion. Few small cystic lesions measuring up to 1.8 cm seen within the visualized left temporal and  pre-auricular region, nonspecific, but likely sebaceous cyst (series 27, images 10, 6). Sinuses/Orbits: Globes and orbital soft tissues within normal limits. Paranasal sinuses are largely clear. Small right mastoid effusion, of doubtful significance. Other: None. IMPRESSION: 1. Patchy small volume acute ischemic nonhemorrhagic cortical infarcts involving the subcortical right frontoparietal region. These are likely embolic in nature. 2. Underlying mild chronic microvascular ischemic disease, advanced for age. Small remote right cerebellar infarct. 3. Multiple scattered chronic micro hemorrhages involving the cerebellum and both cerebral hemispheres, nonspecific, but favored to be secondary to poorly controlled hypertension. Electronically Signed   By: Jeannine Boga M.D.   On: 01/09/2022 23:42   CT HEAD WO CONTRAST (5MM)  Result Date: 01/09/2022 CLINICAL DATA:  Neuro deficit, acute stroke suspected. Generalized weakness. EXAM: CT HEAD WITHOUT CONTRAST TECHNIQUE: Contiguous axial images were obtained from the base of the skull through the vertex without intravenous contrast. RADIATION DOSE REDUCTION: This exam was performed according to the departmental dose-optimization program which includes automated exposure control, adjustment of the mA and/or kV according to patient size and/or use of iterative reconstruction technique. COMPARISON:  CT head 07/24/2021 FINDINGS: Brain: There is no evidence of acute intracranial hemorrhage, mass lesion, brain edema or extra-axial fluid collection. The ventricles and subarachnoid spaces are appropriately sized for age. There is no CT evidence of acute cortical infarction. Vascular:  No hyperdense vessel identified. Skull: Negative for fracture or focal lesion. Sinuses/Orbits: The visualized paranasal sinuses and mastoid air cells are clear. No orbital abnormalities are seen. Other: Small ossific density in the right supraorbital scalp (image 39/4) is unchanged. Probable  sebaceous cyst anterior to the left ear (image 5/3), unchanged. IMPRESSION: Stable head CT without acute intracranial findings. No CT evidence of acute stroke. Electronically Signed   By: Richardean Sale M.D.   On: 01/09/2022 17:55   [place] EKG SITE RITE  Result Date: 01/06/2022 If Site Rite image not attached, placement could not be confirmed due to current cardiac rhythm.  ECHO TEE  Result Date: 01/02/2022    TRANSESOPHOGEAL ECHO REPORT   Patient Name:   Donald [name] Date of Exam: 12/28/2021 Medical Rec #:  [id number]           Height:       70.0 in Accession #:    [id number]          Weight:       205.9 lb Date of Birth:  January 26, 1986          BSA:          2.113 m Patient Age:    35 years            BP:           114/68 mmHg Patient Gender: M                   HR:           73 bpm. Exam Location:  Inpatient Procedure: Transesophageal Echo,  Color Doppler, 3D Echo and Cardiac Doppler Indications:     mitral valve endocarditis  History:         Patient has prior history of Echocardiogram examinations, most                  recent 12/28/2021. LV DysFx, s/p Cardiac Surgery;                  Arrythmias:Atrial Fibrillation.                   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  Sonographer:     Johny Chess RDCS Referring Phys:  [id number] Shaune Pascal BENSIMHON Diagnosing Phys: Glori Bickers MD PROCEDURE: After discussion of the risks and benefits of a TEE, an informed consent was obtained emergent. The patient was intubated. The transesophogeal probe was passed without difficulty through the esophogus of the patient. Imaged were obtained with the patient in a supine position. Sedation performed by performing physician. The patient was monitored while under deep sedation. The patient developed no complications during the procedure. IMPRESSIONS  1. Left ventricular ejection fraction, by estimation, is <20%. The left ventricle has severely decreased function.  The left ventricular internal cavity size was severely dilated.  2. Right ventricular systolic function is severely reduced. The right ventricular size is normal.  3. Left atrial size was severely dilated. No left atrial/left atrial appendage thrombus was detected.  4. Right atrial size was moderately dilated.  5. The mitral valve has been repaired/replaced. Trivial mitral valve regurgitation. The mean mitral valve gradient is 8.0 mmHg. There is a bioprosthetic valve present in the mitral position. 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  6. The aortic valve is tricuspid. Aortic valve regurgitation is trivial.  7. There is mild (Grade II) plaque involving the descending aorta. FINDINGS  Left Ventricle: Left ventricular ejection fraction, by estimation, is <20%. The left ventricle has severely decreased function. The left ventricular internal cavity size was severely dilated. Right Ventricle: The right ventricular size is normal. No increase in right ventricular wall thickness. Right ventricular systolic function is severely reduced. Left Atrium: Left atrial size was severely dilated. No left atrial/left atrial appendage thrombus was detected. Right Atrium: Right atrial size was moderately dilated. Pericardium: There is no evidence of pericardial effusion. Mitral Valve: The mitral valve has been repaired/replaced. Trivial mitral valve regurgitation. There is a bioprosthetic valve present in the mitral position. Echo findings are consistent with vegetation on the mitral prosthesis. MV peak gradient, 15.4 mmHg. The mean mitral valve gradient is 8.0 mmHg. Tricuspid Valve: The tricuspid valve is normal in structure. Tricuspid valve regurgitation is trivial. Aortic Valve: The aortic valve is tricuspid. Aortic valve regurgitation is trivial. Pulmonic Valve: The pulmonic valve was not well visualized. Pulmonic valve regurgitation is not visualized. Aorta: The aortic root is normal in size and  structure. There is mild (Grade II) plaque involving the descending aorta. IAS/Shunts: No atrial level shunt detected by color flow Doppler.  MITRAL VALVE MV Peak grad: 15.4 mmHg MV Mean grad: 8.0 mmHg MV Vmax:      1.96 m/s MV Vmean:     135.0 cm/s Glori Bickers MD Electronically signed by Glori Bickers MD Signature Date/Time: 01/02/2022/12:34:49 PM    Final    DG Abd Portable 1V  Result Date: 01/01/2022 CLINICAL DATA:  Feeding tube  placement. EXAM: PORTABLE ABDOMEN - 1 VIEW COMPARISON:  Chest and abdominal radiographs 12/28/2021. CT 12/27/2021. FINDINGS: Single AP semi erect view centered on the lower chest at 1130 hours. Feeding tube projects below the diaphragm with tip in the right upper quadrant, likely in the distal stomach or proximal duodenum. The visualized bowel gas pattern is normal. Right IJ Swan-Ganz catheter projects into the inter lobar right pulmonary artery, unchanged. There is a left subclavian central venous catheter projecting to the mid SVC level, unchanged. The heart remains moderately enlarged. IMPRESSION: Feeding tube projects to the level of the distal stomach or proximal duodenum. Unchanged position of Swan-Ganz central venous catheters. Electronically Signed   By: Richardean Sale M.D.   On: 01/01/2022 11:38   DG CHEST PORT 1 VIEW  Result Date: 12/28/2021 CLINICAL DATA:  Respiratory failure. EXAM: PORTABLE CHEST 1 VIEW COMPARISON:  Earlier radiograph dated 12/28/2021. FINDINGS: Interval placement of a right IJ Swan-Ganz catheter with tip in the right pulmonary artery. Additional support apparatus in similar position. Cardiomegaly with mild central vascular congestion. No focal consolidation, pleural effusion, pneumothorax. Median sternotomy wires and postsurgical changes of cardiac valve. No acute osseous pathology. IMPRESSION: Interval placement of a right IJ Swan-Ganz catheter. No pneumothorax. Electronically Signed   By: Anner Crete M.D.   On: 12/28/2021 20:58    ECHOCARDIOGRAM COMPLETE  Result Date: 12/28/2021    ECHOCARDIOGRAM REPORT   Patient Name:   Donald [name] Date of Exam: 12/28/2021 Medical Rec #:  [id number]           Height:       70.0 in Accession #:    [id number]          Weight:       205.9 lb Date of Birth:  26-May-1986          BSA:          2.113 m Patient Age:    54 years            BP:           126/83 mmHg Patient Gender: M                   HR:           90 bpm. Exam Location:  Inpatient Procedure: 2D Echo STAT ECHO Indications:    mitral valve disorder. heart failure.  History:        Patient has prior history of Echocardiogram examinations, most                 recent 10/11/2021. Signs/Symptoms:elevated troponin and Shortness                 of Breath.                  Mitral Valve: 31 mm St. Jude mechanical valve valve is present                 in the mitral position. Procedure Date: 06/10/2021.  Sonographer:    Johny Chess RDCS Referring Phys: [id number] [name] IMPRESSIONS  1. Left ventricular ejection fraction, by estimation, is <20%. The left ventricle has severely decreased function. The left ventricle demonstrates global hypokinesis. The left ventricular internal cavity size was severely dilated. Left ventricular diastolic function could not be evaluated.  2. Right ventricular systolic function is moderately reduced. The right ventricular size is moderately enlarged. There is moderately elevated pulmonary artery systolic pressure.  3. Right atrial  size was moderately dilated.  4. Technically challenging images. One of the mitral leaflets can be seen moving, but the other is obscured. Gradient is expected but with very low stroke volume, this may be a falsely low gradient. The mitral valve has been repaired/replaced. Trivial mitral valve regurgitation. The mean mitral valve gradient is 5.5 mmHg. There is a 31 mm St. Jude mechanical valve present in the mitral position. Procedure Date: 06/10/2021.  5. Tricuspid valve  regurgitation is moderate.  6. The aortic valve is tricuspid. Aortic valve regurgitation is trivial.  7. Pulmonic valve regurgitation is moderate.  8. The inferior vena cava is dilated in size with <50% respiratory variability, suggesting right atrial pressure of 15 mmHg. Conclusion(s)/Recommendation(s): Biventricular failure. 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 Can see at least one leaflet moving but not see the other. Gradient low but this may be due to poor CO. Recommend TEE, fluoro, and/or CT for further evaluation of the mitral valve. FINDINGS  Left Ventricle: Left ventricular ejection fraction, by estimation, is <20%. The left ventricle has severely decreased function. The left ventricle demonstrates global hypokinesis. The left ventricular internal cavity size was severely dilated. There is no left ventricular hypertrophy. Left ventricular diastolic function could not be evaluated. Right Ventricle: The right ventricular size is moderately enlarged. Right vetricular wall thickness was not well visualized. Right ventricular systolic function is moderately reduced. There is moderately elevated pulmonary artery systolic pressure. The tricuspid regurgitant velocity is 2.85 m/s, and with an assumed right atrial pressure of 15 mmHg, the estimated right ventricular systolic pressure is 38.1 mmHg. Left Atrium: Left atrial size was not well visualized. Right Atrium: Right atrial size was moderately dilated. Pericardium: There is no evidence of pericardial effusion. Mitral Valve: Technically challenging images. One of the mitral leaflets can be seen moving, but the other is obscured. Gradient is expected but with very low stroke volume, this may be a falsely low gradient. The mitral valve has been repaired/replaced.  Trivial mitral valve regurgitation. There is a 31 mm St. Jude mechanical valve present in the mitral position. Procedure Date: 06/10/2021. MV peak gradient, 11.2 mmHg. The mean mitral valve gradient is  5.5 mmHg. Tricuspid Valve: The tricuspid valve is grossly normal. Tricuspid valve regurgitation is moderate . No evidence of tricuspid stenosis. Aortic Valve: The aortic valve is tricuspid. Aortic valve regurgitation is trivial. Pulmonic Valve: The pulmonic valve was grossly normal. Pulmonic valve regurgitation is moderate. No evidence of pulmonic stenosis. Aorta: The aortic root, ascending aorta, aortic arch and descending aorta are all structurally normal, with no evidence of dilitation or obstruction. Venous: The inferior vena cava is dilated in size with less than 50% respiratory variability, suggesting right atrial pressure of 15 mmHg. IAS/Shunts: The interatrial septum was not well visualized.  LEFT VENTRICLE PLAX 2D LVIDd:         6.00 cm LVIDs:         5.90 cm LV PW:         0.90 cm LV IVS:        1.10 cm LVOT diam:     2.60 cm LV SV:         30 LV SV Index:   14 LVOT Area:     5.31 cm  RIGHT VENTRICLE            IVC RV S prime:     8.59 cm/s  IVC diam: 2.30 cm TAPSE (M-mode): 1.3 cm LEFT ATRIUM         Index  RIGHT ATRIUM           Index LA diam:    4.00 cm 1.89 cm/m  RA Area:     22.40 cm                                 RA Volume:   72.20 ml  34.16 ml/m  AORTIC VALVE LVOT Vmax:   53.60 cm/s LVOT Vmean:  33.000 cm/s LVOT VTI:    0.057 m  AORTA Ao Asc diam: 3.40 cm MITRAL VALVE                TRICUSPID VALVE MV Area (PHT): 6.37 cm     TR Peak grad:   32.5 mmHg MV Area VTI:   1.17 cm     TR Vmax:        285.00 cm/s MV Peak grad:  11.2 mmHg MV Mean grad:  5.5 mmHg     SHUNTS MV Vmax:       1.67 m/s     Systemic VTI:  0.06 m MV Vmean:      106.6 cm/s   Systemic Diam: 2.60 cm MV Decel Time: 119 msec MV E velocity: 143.00 cm/s MV A velocity: 119.00 cm/s MV E/A ratio:  1.20 Buford Dresser MD Electronically signed by Buford Dresser MD Signature Date/Time: 12/28/2021/1:37:16 PM    Final    DG Chest Port 1 View  Result Date: 12/28/2021 CLINICAL DATA:  Respiratory failure and status post  intubation, orogastric tube placement and central line placement. EXAM: PORTABLE CHEST 1 VIEW COMPARISON:  Prior study at 0600 hours FINDINGS: Stable cardiac enlargement. Interval intubation with endotracheal tube tip approximately 4 cm above the carina. Placement of left subclavian central line with catheter tip in the upper SVC. Gastric decompression tube extends below the diaphragm. There is no evidence of pulmonary edema, consolidation, pneumothorax or pleural fluid. IMPRESSION: Appropriate positioning endotracheal tube, central line and orogastric tube. No pneumothorax or other acute findings. Electronically Signed   By: Aletta Edouard M.D.   On: 12/28/2021 12:11   DG Abd Portable 1V  Result Date: 12/28/2021 CLINICAL DATA:  Enteric tube placement EXAM: PORTABLE ABDOMEN - 1 VIEW COMPARISON:  None Available. FINDINGS: Tip of enteric tube is seen in the region of body of stomach. Heart is enlarged in size. Bowel gas pattern in the upper abdomen is unremarkable. IMPRESSION: Tip of enteric tube is seen in the stomach. Electronically Signed   By: Elmer Picker M.D.   On: 12/28/2021 12:00   [place] EKG SITE RITE  Result Date: 12/28/2021 If Site Rite image not attached, placement could not be confirmed due to current cardiac rhythm.  [place] EKG SITE RITE  Result Date: 12/28/2021 If Site Rite image not attached, placement could not be confirmed due to current cardiac rhythm.  DG CHEST PORT 1 VIEW  Result Date: 12/28/2021 CLINICAL DATA:  36 year old male with tachypnea and weakness. EXAM: PORTABLE CHEST 1 VIEW COMPARISON:  Chest radiograph 12/27/2021 and earlier. FINDINGS: Portable AP semi upright view at 0555 hours. Cardiomegaly. Cardiac valve replacement. Prior sternotomy. Stable lung volumes. Allowing for portable technique the lungs are clear. No pneumothorax or pleural effusion. Visualized tracheal air column is within normal limits. Age advanced bony changes at both shoulders. No acute osseous  abnormality identified. Paucity of bowel gas. IMPRESSION: Stable cardiomegaly. No acute cardiopulmonary abnormality. Electronically Signed   By: Genevie Ann M.D.   On: 12/28/2021  06:17   CT Abdomen Pelvis W Contrast  Result Date: 12/27/2021 CLINICAL DATA:  Diarrhea, nausea vomiting.  Abdominal pain. EXAM: CT ABDOMEN AND PELVIS WITH CONTRAST TECHNIQUE: Multidetector CT imaging of the abdomen and pelvis was performed using the standard protocol following bolus administration of intravenous contrast. RADIATION DOSE REDUCTION: This exam was performed according to the departmental dose-optimization program which includes automated exposure control, adjustment of the mA and/or kV according to patient size and/or use of iterative reconstruction technique. CONTRAST:  39m OMNIPAQUE IOHEXOL 300 MG/ML  SOLN COMPARISON:  Pelvic CT dated 03/12/2021. FINDINGS: Lower chest: Bibasilar linear atelectasis/scarring. The visualized lung bases are otherwise clear. There is cardiomegaly. No intra-abdominal free air or free fluid. Hepatobiliary: The liver is enlarged measuring approximately 20 cm in midclavicular length. No biliary ductal dilatation. The gallbladder is unremarkable. Pancreas: Unremarkable. No pancreatic ductal dilatation or surrounding inflammatory changes. Spleen: Normal in size without focal abnormality. Adrenals/Urinary Tract: The adrenal glands unremarkable. Subcentimeter right renal upper pole hypodense focus is too small to characterize. There is no hydronephrosis on either side. The visualized ureters and urinary bladder appear unremarkable. Stomach/Bowel: There is no bowel obstruction or active inflammation. The appendix is normal. Vascular/Lymphatic: The abdominal aorta and IVC are unremarkable. No portal venous gas. Mildly enlarged left iliac chain/pelvic sidewall lymph node measures 14 mm in short axis. Reproductive: The prostate and seminal vesicles are grossly unremarkable. No pelvic mass. Other: Partially  visualized induration of the skin and subcutaneous soft tissues adjacent to the intergluteal cleft and superficial to the coccyx. The no definite drainable fluid collection identified by CT. This can be better evaluated with ultrasound to assess for possible drainage. Musculoskeletal: Bilateral sacroiliitis. No acute osseous pathology. IMPRESSION: 1. No acute intra-abdominal or pelvic pathology. No bowel obstruction. Normal appendix. 2. Hepatomegaly. 3. Partially visualized induration of the skin and subcutaneous soft tissues adjacent to the intergluteal cleft and superficial to the coccyx. The no definite drainable fluid collection identified by CT. This can be better evaluated with ultrasound to assess for possible drainage. Electronically Signed   By: AAnner CreteM.D.   On: 12/27/2021 20:21   DG Chest 2 View  Result Date: 12/27/2021 CLINICAL DATA:  Cough and fever. EXAM: CHEST - 2 VIEW COMPARISON:  One-view chest x-ray 07/25/2021 FINDINGS: The heart is enlarged. No edema or effusion is present. No focal airspace disease is present. Mitral valve prosthesis noted. The visualized soft tissues and bony thorax are unremarkable. IMPRESSION: 1. Cardiomegaly without failure. 2. No acute cardiopulmonary disease. Electronically Signed   By: CSan MorelleM.D.   On: 12/27/2021 15:04    Labs:  CBC: Recent Labs    01/10/22 0505 01/11/22 0452 01/12/22 0125 01/13/22 0430  WBC 7.5 7.7 8.3 8.9  HGB 10.2* 9.8* 9.3* 8.8*  HCT 31.4* 30.7* 29.2* 27.1*  PLT 380 365 383 347    COAGS: Recent Labs    06/01/21 0316 06/02/21 0314 06/03/21 0315 06/04/21 0604 06/20/21 1020 01/10/22 0505 01/11/22 0452 01/12/22 0125 01/13/22 0430  INR  --   --   --   --    < > 2.0* 2.7* 2.9* 2.5*  APTT 68* 83* 83* 71*  --   --   --   --   --    < > = values in this interval not displayed.    BMP: Recent Labs    01/10/22 0505 01/11/22 0452 01/12/22 0125 01/13/22 0430  NA 130* 128* 128* 131*  K 3.9 4.0  4.0 3.8  CL  102 99 100 101  CO2 22 21* 23 23  GLUCOSE 120* 107* 129* 90  BUN '13 11 15 15  ' CALCIUM 9.0 9.0 8.8* 8.9  CREATININE 0.77 0.78 0.82 0.72  GFRNONAA >60 >60 >60 >60    LIVER FUNCTION TESTS: Recent Labs    01/04/22 0323 01/06/22 0409 01/07/22 0357 01/08/22 0525  BILITOT 1.0 1.0 1.0 1.0  AST 910* 407* 317* 240*  ALT 826* 498* 375* 294*  ALKPHOS 143* 115 128* 129*  PROT 7.3 7.6 8.4* 8.7*  ALBUMIN 2.0* 2.2* 2.4* 2.5*    TUMOR MARKERS: No results for input(s): "AFPTM", "CEA", "CA199", "CHROMGRNA" in the last 8760 hours.  Assessment and Plan:  LUE collection aspiration in IR today Risks and benefits of left upper extremity fluid collection aspiration was discussed with the patient and/or patient's family including, but not limited to bleeding, infection, damage to adjacent structures or low yield requiring additional tests.  All of the questions were answered and there is agreement to proceed.  Consent signed and in chart.   Thank you for this interesting consult.  I greatly enjoyed meeting Donald [name] and look forward to participating in their care.  A copy of this report was sent to the requesting provider on this date.  Electronically Signed: Lavonia Drafts, PA-C 01/13/2022, 9:24 AM   I spent a total of 20 Minutes    in face to face in clinical consultation, greater than 50% of which was counseling/coordinating care for LUE fluid collection aspiration

## 2022-01-13 NOTE — Progress Notes (Addendum)
Advanced Heart Failure Rounding Note   Subjective:   7/19 Extubated. Diuresed with IV lasix. I/O not accurate. Norepi weaned off.  7/20 Swan out  7/23 Milrinone off 7/27 Acute LUE weakness/swelling. CODE Stroke. MRI w/ several small acute right MCA territory ischemic strokes, suggestive of a cardioembolic etiology. UE CT with contrast concerning for cellulitis and pyomyositis of left axilla   Continue w/ LUE weakness and pain, appears to be worsening. Awaiting aspiration/drain placement by IR once INR appropriate (2.5 today). Coumadin remains on hold, last dose 7/28.   Remains on On vanc/cefepime for pMVR endocarditis. Has PICC for continuation of home IV abx.   Wt trending up, BP elevated yesterday. On midodrine 2.5 tid. Appetite picking up.   Mg remains chronically low, 1.3 today   Denies dyspnea.   D/w GS, they are highly concern for progression in size of abscess.   Objective:     Vital Signs:   Temp:  [97.7 F (36.5 C)-98.6 F (37 C)] 98.4 F (36.9 C) (07/31 0807) Pulse Rate:  [77-84] 77 (07/31 0807) Resp:  [13-20] 16 (07/31 0807) BP: (88-166)/(60-104) 166/104 (07/31 0807) SpO2:  [97 %-100 %] 99 % (07/31 0807) Weight:  [90.1 kg] 90.1 kg (07/31 0349) Last BM Date : 01/10/22  Weight change: Filed Weights   01/11/22 0358 01/12/22 0324 01/13/22 0349  Weight: 87.4 kg 88.3 kg 90.1 kg    Intake/Output:   Intake/Output Summary (Last 24 hours) at 01/13/2022 0835 Last data filed at 01/13/2022 0440 Gross per 24 hour  Intake 20 ml  Output 1700 ml  Net -1680 ml   PHYSICAL EXAM: General:  Well appearing. No respiratory difficulty HEENT: normal Neck: supple. JVD 7 cm. Carotids 2+ bilat; no bruits. No lymphadenopathy or thyromegaly appreciated. Cor: PMI nondisplaced. Regular rate & rhythm. + mechanical valve sounds  Lungs: clear Abdomen: soft, nontender, nondistended. No hepatosplenomegaly. No bruits or masses. Good bowel sounds. Extremities: no cyanosis, clubbing,  rash, no LE edema. LUE edematous  Neuro: alert & oriented x 3 Affect pleasant. + LUE weakness, unable to lift lt arm    Telemetry: SR 80s (personally reviewed)   Labs: Basic Metabolic Panel: Recent Labs  Lab 01/09/22 0624 01/10/22 0505 01/11/22 0452 01/12/22 0125 01/13/22 0430  NA 133* 130* 128* 128* 131*  K 4.5 3.9 4.0 4.0 3.8  CL 103 102 99 100 101  CO2 23 22 21* 23 23  GLUCOSE 83 120* 107* 129* 90  BUN 21* 13 11 15 15   CREATININE 0.82 0.77 0.78 0.82 0.72  CALCIUM 9.1 9.0 9.0 8.8* 8.9  MG 1.7 1.6* 1.5* 1.6* 1.3*    Liver Function Tests: Recent Labs  Lab 01/07/22 0357 01/08/22 0525  AST 317* 240*  ALT 375* 294*  ALKPHOS 128* 129*  BILITOT 1.0 1.0  PROT 8.4* 8.7*  ALBUMIN 2.4* 2.5*   No results for input(s): "LIPASE", "AMYLASE" in the last 168 hours. No results for input(s): "AMMONIA" in the last 168 hours.   CBC: Recent Labs  Lab 01/09/22 0624 01/10/22 0505 01/11/22 0452 01/12/22 0125 01/13/22 0430  WBC 8.0 7.5 7.7 8.3 8.9  HGB 10.0* 10.2* 9.8* 9.3* 8.8*  HCT 31.3* 31.4* 30.7* 29.2* 27.1*  MCV 82.2 81.1 82.1 81.6 81.1  PLT 397 380 365 383 347    Cardiac Enzymes: Recent Labs  Lab 01/10/22 0505  CKTOTAL 180    BNP: BNP (last 3 results) Recent Labs    07/24/21 1433 10/11/21 1230 12/27/21 1451  BNP 3,184.1* 400.8* >4,500.0*  ProBNP (last 3 results) No results for input(s): "PROBNP" in the last 8760 hours.    Other results:  Imaging: No results found.   Medications:     Scheduled Medications:  amiodarone  200 mg Oral Daily   aspirin  81 mg Oral Daily   Chlorhexidine Gluconate Cloth  6 each Topical Daily   dapagliflozin propanediol  10 mg Oral Daily   digoxin  0.125 mg Oral Daily   feeding supplement  237 mL Oral TID BM   midodrine  2.5 mg Oral TID WC   rosuvastatin  40 mg Oral Daily   sodium chloride flush  10-40 mL Intracatheter Q12H   Warfarin - Pharmacist Dosing Inpatient   Does not apply q1600    Infusions:   sodium chloride 10 mL/hr at 01/06/22 0901   sodium chloride 10 mL/hr at 01/07/22 2200   ceFEPime (MAXIPIME) IV 2 g (01/13/22 0439)   magnesium sulfate bolus IVPB 2 g (01/13/22 0831)   magnesium sulfate bolus IVPB     vancomycin 750 mg (01/12/22 2318)    PRN Medications: sodium chloride, sodium chloride, ipratropium-albuterol, mouth rinse, oxyCODONE, sodium chloride flush   Assessment/Plan:   1. Mixed cardiogenic/septic shock - echo 12/28/21 LVEF 10% RV sev HK - TEE 7/15 severe biventricular failure. + vegetation on mechanical MVR - Bcx - NGTD - Respiratory culture w/ moderate WBCs (mononuclear and PMN) and no organisms  - Cultures no growth.  - ID following. Continue vanc/cefepime. Now AF - Off inotropes/pressors.   2. 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 Repeat echo as above.  - Echo this admit LVEF 10% RV severely HK.  - Off milrinone and NE. - Euvolemic but wt starting to trend back up  - Continue Farxiga 10 mg daily - Continue digoxin 0.125, dig level 0.5. - BPs now elevated. Stop midodrine - Restart spiro 12.5 mg daily. Monitor BP closely  - Long-term only option is transplant. Dr. 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 They will discuss.    3. 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  - Underwent MVR with mechanical mitral valve in 05/2021 at [facility].  - TEE 7/15 severe biventricular failure. + vegetation on mechanical MVR - Bcx NGTD. -> on vanc/cefepime. 6 weeks IV abx, end date 08/24.  - Has PICC for home abx  - ID following. Appreciate assistance. - INR 2.5  (goal 2.5-3.5). Holding coumadin for  aspiration/drain placement for LUE fluid collection    4. Acute hypoxic respiratory failure - intubated 7/15 in setting of shock - Extubated 719  - O2 stable on RA. Resolved.  5. Paroxysmal Atrial Fibrillation - In SR  - does not tolerate AF - liver enzymes continue to improve. - back on amio 200 daily  5. AKI - Due to ATN/shock - Baseline creatinine 0.98 - 1.0.  - Peak 2.6. Resolved.   6. Shock liver - continue supportive care - AST /ALT continue trend down.   7. Autoimmune Disorder  - He has seronegative spondylitis and pyoderma gangrenosum. Had been on Humira. - Followed by Rheumatology as an outpatient.   8. Hypomagnesemia/hypokalemia - Mag 1.3. Give 4 gm mag IV again today. -  K 3.8  9. Deconditioning - PT/OT following. Will need outpatient PT/OT post stroke. - Continue to mobilize.   10. 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 LUE cellulitis and pyomyositis of left axilla  - per GS, too high risk for I&D under general anesthesia - IR consulted for aspiration/drain placement, once INR appropriate (2.5 today)  - D/w GS, they are highly concern for progression in size of abscess and concern regarding further delay of intervention in the setting of therapeutic INR and risk for development of compartment syndrome. Will d/w Dr. Shirlee Latch possible treatment w/ Vit K for INR reversal to allow for intervention today. This is c/b by the fact that he has a mechanical mitral valve, known valve vegetation and recent cardio-embolic strokes.  Will also need to revisit surgical risk under general anesthesia, in the event that abscess is not amendable to conservative tx.    Length of Stay: [address] PA-C  8:35 AM   01/13/2022, 8:35 AM  Advanced Heart Failure Team Pager [phone] (M-F; 7a - 4p)  Please contact [facility] for night-coverage after hours  (4p -7a ) and weekends on amion.  Patient seen with PA, agree with the above note.   BP elevated today, remains on midodrine.  Creatinine stable.  Left axillary abscess appears to have grown with increased swelling.  INR still 2.5.   General: NAD Neck: No JVD, no thyromegaly or thyroid nodule.  Lungs: Clear to auscultation bilaterally with normal respiratory effort. CV: Nondisplaced PMI.  Heart regular S1/S2, no S3/S4, no murmur.  No peripheral edema.   Abdomen: Soft, nontender, no hepatosplenomegaly, no distention.  Skin: Warmth/erythema left upper arm.  Neurologic: Alert and oriented x 3.  Psych: Normal affect. Extremities: No clubbing or cyanosis. LUE swelling.  HEENT: Normal.   Difficult situation, patient has enlarging left axillary abscess with risk for compartment syndrome development.  INR is 2.5, he has mechanical MV.  Surgery and IR following.  If procedure on abscess needs to be done emergently, will need to reverse INR with FFP and start heparin gtt as soon as possible after procedure.  If procedure can be delayed until Tuesday or Wednesday, we can give vitamin K to speed lowering INR and cover with heparin gtt when INR < 2.  Will need guidance from surgery regarding the timing needed for abscess drainage.  General anesthesia would be high risk given biventricular failure.  He is currently as stable as we can get him acutely from a cardiac standpoint, however.    With elevated BP, stop midodrine today and restart spironolactone.  Volume status looks ok.   Continue vancomycin/cefepime for mechanical MV endocarditis and abscess.   Marca Ancona. 01/13/2022 10:18 AM

## 2022-01-13 NOTE — Procedures (Signed)
  Procedure:  US aspiration L upper arm bloody thin fluid, sent for GS C&S Preprocedure diagnosis: The primary encounter diagnosis was Acute febrile illness. Diagnoses of Nausea vomiting and diarrhea, Generalized weakness, Elevated lactic acid level, Elevated troponin, Cardiomyopathy, unspecified type (HCC), H/O mitral valve replacement, Anticoagulated on Coumadin, Pyoderma gangrenosum, Dehydration, Endocarditis of prosthetic mitral valve (HCC), and Acute on chronic systolic and diastolic heart failure, NYHA class 3 (HCC) were also pertinent to this visit.  Postprocedure diagnosis: same EBL:    minimal Complications:   none immediate  See full dictation in [employer].  Thora Lance MD Main # [phone] Pager  [phone] Mobile [phone]

## 2022-01-13 NOTE — Progress Notes (Addendum)
ANTICOAGULATION CONSULT NOTE  Pharmacy Consult for enoxaparin>warfarin Indication:  MVR /AFib  No Known Allergies  Patient Measurements: Height: 5\' 10"  (177.8 cm) Weight: 90.1 kg (198 lb 10.2 oz) IBW/kg (Calculated) : 73 Heparin Dosing Weight: 90.7 kg  Vital Signs: Temp: 98.4 F (36.9 C) (07/31 0807) Temp Source: Oral (07/31 0807) BP: 166/104 (07/31 0807) Pulse Rate: 77 (07/31 0807)  Labs: Recent Labs    01/11/22 0452 01/12/22 0125 01/13/22 0430  HGB 9.8* 9.3* 8.8*  HCT 30.7* 29.2* 27.1*  PLT 365 383 347  LABPROT 28.6* 29.8* 26.7*  INR 2.7* 2.9* 2.5*  CREATININE 0.78 0.82 0.72     Estimated Creatinine Clearance: 148.2 mL/min (by C-G formula based on SCr of 0.72 mg/dL).   Medical History: Past Medical History:  Diagnosis Date   Anemia    Autoimmune disorder (HCC)    pyoderma gangrenosum   CHF (congestive heart failure) (HCC)    Chronic systolic heart failure (HCC)    a. EF 35-40% by echo in 07/2018 b. EF at 45% by repeat echo in 04/2020   DVT (deep venous thrombosis) (HCC)    h/o   Dysrhythmia    Mitral regurgitation    a. 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 b. 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HCC)    arthritis   Shock, septic and cardiogenic 12/28/2021   Tricuspid regurgitation    Assessment: 35 yom with a history of mitral regurgitation s/p mechanical valve replacement on Warfarin, HF, AF and prior DVT, and Hx of hidradenitis and pyoderma gangrenosum .   Patient taking warfarin PTA 4mg  daily per last AC note.  Warfarin on hold with likely need for IR aspiration vs surgical I&D. INR 2.5 therapeutic.  Goal of Therapy:  INR goal 2.5-3.5 Monitor platelets by anticoagulation protocol: Yes   Plan:  Hold warfarin for  now, need to F/U surgical and anticoag plans  ADDENDUM 1340: discussed with IR PA 12/30/2021) - if procedure delayed to tomorrow hold warfarin tonight, if procedure is completed ok to give a dose tonight.   , PharmD, BCPS, [facility] Clinical Pharmacist [phone] Please check AMION for all [facility] numbers 01/13/2022

## 2022-01-13 NOTE — Progress Notes (Signed)
Subjective: CC: LUE pain reported as stable. Denies fever/chills.    Objective: Vital signs in last 24 hours: Temp:  [97.7 F (36.5 C)-98.6 F (37 C)] 98.4 F (36.9 C) (07/31 0807) Pulse Rate:  [77-84] 77 (07/31 0807) Resp:  [13-20] 16 (07/31 0807) BP: (88-166)/(60-104) 166/104 (07/31 0807) SpO2:  [97 %-100 %] 99 % (07/31 0807) Weight:  [90.1 kg] 90.1 kg (07/31 0349) Last BM Date : 01/10/22  Intake/Output from previous day: 07/30 0701 - 07/31 0700 In: 20 [I.V.:20] Out: 1700 [Urine:1700] Intake/Output this shift: No intake/output data recorded.  PE: Gen:  Alert, NAD, pleasant Msk: There is induration and warmth in the left axilla. swelling of the left upper arm has progressed to the left forearm. L arm with persistent pain worse with movement - no better compared to previous exam. No bruising. Scant bloody drainage from axillary aspiration site. Left arm weakness present but demonstrates able to partially flex/ext elbow and wrist. Fingers arm wwp.   Lab Results:  Recent Labs    01/12/22 0125 01/13/22 0430  WBC 8.3 8.9  HGB 9.3* 8.8*  HCT 29.2* 27.1*  PLT 383 347   BMET Recent Labs    01/12/22 0125 01/13/22 0430  NA 128* 131*  K 4.0 3.8  CL 100 101  CO2 23 23  GLUCOSE 129* 90  BUN 15 15  CREATININE 0.82 0.72  CALCIUM 8.8* 8.9   PT/INR Recent Labs    01/12/22 0125 01/13/22 0430  LABPROT 29.8* 26.7*  INR 2.9* 2.5*   CMP     Component Value Date/Time   NA 131 (L) 01/13/2022 0430   K 3.8 01/13/2022 0430   CL 101 01/13/2022 0430   CO2 23 01/13/2022 0430   GLUCOSE 90 01/13/2022 0430   BUN 15 01/13/2022 0430   CREATININE 0.72 01/13/2022 0430   CALCIUM 8.9 01/13/2022 0430   PROT 8.7 (H) 01/08/2022 0525   ALBUMIN 2.5 (L) 01/08/2022 0525   AST 240 (H) 01/08/2022 0525   ALT 294 (H) 01/08/2022 0525   ALKPHOS 129 (H) 01/08/2022 0525   BILITOT 1.0 01/08/2022 0525   GFRNONAA >60 01/13/2022 0430   GFRAA >60 01/09/2015 1630   Lipase      Component Value Date/Time   LIPASE 31 07/22/2021 0248    Studies/Results: No results found.  Anti-infectives: Anti-infectives (From admission, onward)    Start     Dose/Rate Route Frequency Ordered Stop   01/08/22 0000  ceFEPime (MAXIPIME) IVPB        2 g Intravenous Every 8 hours 01/08/22 1224 02/08/22 2359   01/08/22 0000  vancomycin IVPB        750 mg Intravenous Every 12 hours 01/08/22 1224 02/07/22 2359   01/07/22 2328  vancomycin (VANCOREADY) IVPB 750 mg/150 mL        750 mg 150 mL/hr over 60 Minutes Intravenous Every 12 hours 01/07/22 1140     12/30/21 1400  ceFEPIme (MAXIPIME) 2 g in sodium chloride 0.9 % 100 mL IVPB        2 g 200 mL/hr over 30 Minutes Intravenous Every 8 hours 12/30/21 0710 02/06/22 2359   12/30/21 1000  vancomycin (VANCOREADY) IVPB 750 mg/150 mL  Status:  Discontinued        750 mg 150 mL/hr over 60 Minutes Intravenous Every 8 hours 12/30/21 0713 01/07/22 1140   12/29/21 1015  vancomycin (VANCOREADY) IVPB 1250 mg/250 mL  Status:  Discontinued  1,250 mg 166.7 mL/hr over 90 Minutes Intravenous Every 24 hours 12/29/21 0915 12/30/21 0713   12/28/21 1700  ceFEPIme (MAXIPIME) 2 g in sodium chloride 0.9 % 100 mL IVPB  Status:  Discontinued        2 g 200 mL/hr over 30 Minutes Intravenous Every 12 hours 12/28/21 1227 12/30/21 0710   12/28/21 1227  vancomycin variable dose per unstable renal function (pharmacist dosing)  Status:  Discontinued         Does not apply See admin instructions 12/28/21 1227 12/29/21 1233   12/28/21 0600  vancomycin (VANCOREADY) IVPB 1250 mg/250 mL  Status:  Discontinued        1,250 mg 166.7 mL/hr over 90 Minutes Intravenous Every 12 hours 12/27/21 1812 12/28/21 1235   12/27/21 1815  ceFEPIme (MAXIPIME) 2 g in sodium chloride 0.9 % 100 mL IVPB  Status:  Discontinued        2 g 200 mL/hr over 30 Minutes Intravenous Every 8 hours 12/27/21 1812 12/28/21 1227   12/27/21 1645  ceFEPIme (MAXIPIME) 2 g in sodium chloride 0.9 % 100  mL IVPB  Status:  Discontinued        2 g 200 mL/hr over 30 Minutes Intravenous  Once 12/27/21 1634 12/27/21 1644   12/27/21 1645  metroNIDAZOLE (FLAGYL) IVPB 500 mg        500 mg 100 mL/hr over 60 Minutes Intravenous  Once 12/27/21 1634 12/27/21 1944   12/27/21 1645  vancomycin (VANCOCIN) IVPB 1000 mg/200 mL premix  Status:  Discontinued        1,000 mg 200 mL/hr over 60 Minutes Intravenous  Once 12/27/21 1634 12/27/21 1643   12/27/21 1645  vancomycin (VANCOREADY) IVPB 1750 mg/350 mL        1,750 mg 175 mL/hr over 120 Minutes Intravenous  Once 12/27/21 1643 12/27/21 2150   12/27/21 1645  ceFEPIme (MAXIPIME) 2 g in sodium chloride 0.9 % 100 mL IVPB  Status:  Discontinued        2 g 200 mL/hr over 30 Minutes Intravenous Every 8 hours 12/27/21 1644 12/27/21 1812        Assessment/Plan 36 year old male with multiple medical co-morbidities including, but not limited to, acute right MCA stroke, endocarditis of prosthetic mitral valve, and heart failure with reduced ejection fraction on oral anticoagulation who has a left axillary/upper arm abscess with cellulitis on exam and pyomyositis on CT.  Given his acute on chronic heart failure and need for anticoagulation We think placing this patient under a general anesthetic for incision and drainage of his axilla would be high risk, cardiology team confirms this risk. We attempted bedside aspiration on 7/28 without success.   On exam today I am concerned about progression of left axillary abscess to involve more of the left upper extremity. He does not have a compartment syndrome during my exam. I will discuss his case with interventional radiology (Dr. Miles Costain) and will discuss w/ possible urgent reversal of anticoagulation with cardiology in order to address LUE infection in a more timely fashion. His INR is elevated at 2.5. would need FFP +/- Vit. K.  Continue broad-spectrum antibiotics.   LOS: 16 days    Adam Phenix , [facility] Surgery 01/13/2022, 8:27 AM Please see Amion for pager number during day hours 7:00am-4:30pm

## 2022-01-14 DIAGNOSIS — N179 Acute kidney failure, unspecified: Secondary | ICD-10-CM | POA: Diagnosis not present

## 2022-01-14 DIAGNOSIS — I5043 Acute on chronic combined systolic (congestive) and diastolic (congestive) heart failure: Secondary | ICD-10-CM | POA: Diagnosis not present

## 2022-01-14 DIAGNOSIS — I38 Endocarditis, valve unspecified: Secondary | ICD-10-CM | POA: Diagnosis not present

## 2022-01-14 DIAGNOSIS — M79602 Pain in left arm: Secondary | ICD-10-CM

## 2022-01-14 DIAGNOSIS — Z7901 Long term (current) use of anticoagulants: Secondary | ICD-10-CM | POA: Diagnosis not present

## 2022-01-14 DIAGNOSIS — I480 Paroxysmal atrial fibrillation: Secondary | ICD-10-CM | POA: Diagnosis not present

## 2022-01-14 DIAGNOSIS — T826XXA Infection and inflammatory reaction due to cardiac valve prosthesis, initial encounter: Secondary | ICD-10-CM | POA: Diagnosis not present

## 2022-01-14 DIAGNOSIS — L88 Pyoderma gangrenosum: Secondary | ICD-10-CM | POA: Diagnosis not present

## 2022-01-14 LAB — BASIC METABOLIC PANEL
Anion gap: 6 (ref 5–15)
BUN: 14 mg/dL (ref 6–20)
CO2: 24 mmol/L (ref 22–32)
Calcium: 8.8 mg/dL — ABNORMAL LOW (ref 8.9–10.3)
Chloride: 101 mmol/L (ref 98–111)
Creatinine, Ser: 0.62 mg/dL (ref 0.61–1.24)
GFR, Estimated: >60 mL/min (ref >60)
Glucose, Bld: 101 mg/dL — ABNORMAL HIGH (ref 70–99)
Potassium: 3.9 mmol/L (ref 3.5–5.1)
Sodium: 131 mmol/L — ABNORMAL LOW (ref 135–145)

## 2022-01-14 LAB — HEPARIN LEVEL (UNFRACTIONATED): Heparin Unfractionated: 0.26 IU/mL — ABNORMAL LOW (ref 0.30–0.70)

## 2022-01-14 LAB — SARS CORONAVIRUS 2 BY RT PCR: SARS Coronavirus 2 by RT PCR: NEGATIVE

## 2022-01-14 LAB — CBC
HCT: 27 % — ABNORMAL LOW (ref 39.0–52.0)
Hemoglobin: 8.5 g/dL — ABNORMAL LOW (ref 13.0–17.0)
MCH: 25.9 pg — ABNORMAL LOW (ref 26.0–34.0)
MCHC: 31.5 g/dL (ref 30.0–36.0)
MCV: 82.3 fL (ref 80.0–100.0)
Platelets: 360 10*3/uL (ref 150–400)
RBC: 3.28 MIL/uL — ABNORMAL LOW (ref 4.22–5.81)
RDW: 17.5 % — ABNORMAL HIGH (ref 11.5–15.5)
WBC: 8.8 10*3/uL (ref 4.0–10.5)
nRBC: 0 % (ref 0.0–0.2)

## 2022-01-14 LAB — PROTIME-INR
INR: 2.2 — ABNORMAL HIGH (ref 0.8–1.2)
Prothrombin Time: 24.5 seconds — ABNORMAL HIGH (ref 11.4–15.2)

## 2022-01-14 LAB — MAGNESIUM: Magnesium: 1.5 mg/dL — ABNORMAL LOW (ref 1.7–2.4)

## 2022-01-14 MED ORDER — FUROSEMIDE 40 MG PO TABS
40.0000 mg | ORAL_TABLET | Freq: Every day | ORAL | Status: DC
  Administered 2022-01-15 – 2022-01-17 (×3): 40 mg via ORAL
  Filled 2022-01-14 (×3): qty 1

## 2022-01-14 MED ORDER — SPIRONOLACTONE 25 MG PO TABS
25.0000 mg | ORAL_TABLET | Freq: Every day | ORAL | Status: DC
  Administered 2022-01-15 – 2022-01-17 (×3): 25 mg via ORAL
  Filled 2022-01-14 (×3): qty 1

## 2022-01-14 MED ORDER — POLYETHYLENE GLYCOL 3350 17 G PO PACK
17.0000 g | PACK | Freq: Every day | ORAL | Status: DC
  Administered 2022-01-14: 17 g via ORAL
  Filled 2022-01-14: qty 1

## 2022-01-14 MED ORDER — MAGNESIUM SULFATE 4 GM/100ML IV SOLN
4.0000 g | Freq: Once | INTRAVENOUS | Status: AC
Start: 2022-01-14 — End: 2022-01-14
  Administered 2022-01-14: 4 g via INTRAVENOUS
  Filled 2022-01-14: qty 100

## 2022-01-14 MED ORDER — MAGNESIUM SULFATE 2 GM/50ML IV SOLN
2.0000 g | Freq: Once | INTRAVENOUS | Status: AC
  Administered 2022-01-14: 2 g via INTRAVENOUS
  Filled 2022-01-14: qty 50

## 2022-01-14 MED ORDER — FERROUS SULFATE 325 (65 FE) MG PO TABS
325.0000 mg | ORAL_TABLET | ORAL | Status: DC
  Administered 2022-01-14 – 2022-01-16 (×2): 325 mg via ORAL
  Filled 2022-01-14 (×3): qty 1

## 2022-01-14 MED ORDER — HEPARIN (PORCINE) 25000 UT/250ML-% IV SOLN
1750.0000 [IU]/h | INTRAVENOUS | Status: DC
  Administered 2022-01-14: 1600 [IU]/h via INTRAVENOUS
  Administered 2022-01-15: 1750 [IU]/h via INTRAVENOUS
  Filled 2022-01-14 (×2): qty 250

## 2022-01-14 MED ORDER — MAGNESIUM SULFATE 2 GM/50ML IV SOLN
2.0000 g | Freq: Once | INTRAVENOUS | Status: AC
Start: 2022-01-14 — End: 2022-01-14
  Administered 2022-01-14: 2 g via INTRAVENOUS
  Filled 2022-01-14: qty 50

## 2022-01-14 MED ORDER — SENNA 8.6 MG PO TABS
1.0000 | ORAL_TABLET | Freq: Every day | ORAL | Status: DC
  Administered 2022-01-14: 8.6 mg via ORAL
  Filled 2022-01-14: qty 1

## 2022-01-14 NOTE — Progress Notes (Addendum)
Advanced Heart Failure Rounding Note   Subjective:   7/19 Extubated. Diuresed with IV lasix. I/O not accurate. Norepi weaned off.  7/20 Swan out  7/23 Milrinone off 7/27 Acute LUE weakness/swelling. CODE Stroke. MRI w/ several small acute right MCA territory ischemic strokes, suggestive of a cardioembolic etiology. UE CT with contrast concerning for cellulitis and pyomyositis of left axilla 7/31 s/p  IR aspiration of 100 cc thin bloody fluid from left axilla. Cx NGTD. Midodrine discontinued w/ elevated BPs. Low dose spiro restarted   C/w LUE pain and swelling. Minimal improvement post aspiration.    INR 2.2 today   BPs mildly elevated. Wt trending up. Appetite improving. Denies dyspnea.    Remains on On vanc/cefepime for pMVR endocarditis. Has PICC for continuation of home IV abx.    Mg remains chronically low despite daily IV supp, 1.5 today. K 3.9    Objective:     Vital Signs:   Temp:  [98.5 F (36.9 C)-99.1 F (37.3 C)] 99.1 F (37.3 C) (08/01 0412) Pulse Rate:  [77-86] 80 (08/01 0412) Resp:  [15-36] 20 (08/01 0412) BP: (120-175)/(64-96) 140/84 (08/01 0412) SpO2:  [96 %-100 %] 100 % (08/01 0412) Weight:  [90.9 kg] 90.9 kg (08/01 0412) Last BM Date : 01/10/22  Weight change: Filed Weights   01/12/22 0324 01/13/22 0349 01/14/22 0412  Weight: 88.3 kg 90.1 kg 90.9 kg    Intake/Output:   Intake/Output Summary (Last 24 hours) at 01/14/2022 0856 Last data filed at 01/14/2022 0630 Gross per 24 hour  Intake 250 ml  Output 1350 ml  Net -1100 ml   PHYSICAL EXAM: General:  fatigued appearing. No respiratory difficulty HEENT: normal Neck: supple. JVD 7-8 cm. Carotids 2+ bilat; no bruits. No lymphadenopathy or thyromegaly appreciated. Cor: PMI nondisplaced. Regular rate & rhythm. + mechanical valve sounds  Lungs: clear Abdomen: soft, nontender, nondistended. No hepatosplenomegaly. No bruits or masses. Good bowel sounds. Extremities: no cyanosis, clubbing, rash, no LE  edema. LUE edematous  Neuro: alert & oriented x 3 Affect pleasant. + LUE weakness, unable to lift lt arm    Telemetry: SR 80s (personally reviewed)   Labs: Basic Metabolic Panel: Recent Labs  Lab 01/10/22 0505 01/11/22 0452 01/12/22 0125 01/13/22 0430 01/14/22 0419  NA 130* 128* 128* 131* 131*  K 3.9 4.0 4.0 3.8 3.9  CL 102 99 100 101 101  CO2 22 21* 23 23 24   GLUCOSE 120* 107* 129* 90 101*  BUN 13 11 15 15 14   CREATININE 0.77 0.78 0.82 0.72 0.62  CALCIUM 9.0 9.0 8.8* 8.9 8.8*  MG 1.6* 1.5* 1.6* 1.3* 1.5*    Liver Function Tests: Recent Labs  Lab 01/08/22 0525  AST 240*  ALT 294*  ALKPHOS 129*  BILITOT 1.0  PROT 8.7*  ALBUMIN 2.5*   No results for input(s): "LIPASE", "AMYLASE" in the last 168 hours. No results for input(s): "AMMONIA" in the last 168 hours.   CBC: Recent Labs  Lab 01/10/22 0505 01/11/22 0452 01/12/22 0125 01/13/22 0430 01/14/22 0419  WBC 7.5 7.7 8.3 8.9 8.8  HGB 10.2* 9.8* 9.3* 8.8* 8.5*  HCT 31.4* 30.7* 29.2* 27.1* 27.0*  MCV 81.1 82.1 81.6 81.1 82.3  PLT 380 365 383 347 360    Cardiac Enzymes: Recent Labs  Lab 01/10/22 0505  CKTOTAL 180    BNP: BNP (last 3 results) Recent Labs    07/24/21 1433 10/11/21 1230 12/27/21 1451  BNP 3,184.1* 400.8* >4,500.0*    ProBNP (last 3 results)  No results for input(s): "PROBNP" in the last 8760 hours.    Other results:  Imaging: [place] FINE NEEDLE ASP 1ST LESION  Result Date: 01/13/2022 INDICATION: Complex deep collection in the anterior compartment left upper arm. EXAM: ULTRASOUND ASPIRATION LEFT UPPER EXTREMITY MEDICATIONS: Lidocaine 1% subcutaneous ANESTHESIA/SEDATION: Intravenous Fentanyl and Versed 2mg  were administered as conscious sedation during continuous monitoring of the patient's level of consciousness and physiological / cardiorespiratory status by the radiology RN, with a total moderate sedation time of 14 minutes. COMPLICATIONS: None immediate. PROCEDURE: Informed  written consent was obtained from the patient after a thorough discussion of the procedural risks, benefits and alternatives. All questions were addressed. Maximal Sterile Barrier Technique was utilized including caps, mask, sterile gowns, sterile gloves, sterile drape, hand hygiene and skin antiseptic. A timeout was performed prior to the initiation of the procedure. Survey of the upper arm was performed. Complex deep intramuscular collection was localized, with fluid levels. An appropriate skin entry site was determined and marked. After sedation and lidocaine administration, 7 cm multi sidehole Yueh sheath needle advanced to the collection. Approximately 100 mL of thin bloody fluid were aspirated, sent for Gram stain and culture. Follow-up scan shows decrease in the size of the overall collection with some residual loculated fluid. The patient tolerated the procedure well. IMPRESSION: 1. Technically successful ultrasound-guided aspiration of left upper arm intramuscular collection, returning 100 mL thin bloody fluid, sent for Gram stain and culture. Electronically Signed   By: M.D.   On: 01/13/2022 16:26     Medications:     Scheduled Medications:  amiodarone  200 mg Oral Daily   aspirin  81 mg Oral Daily   Chlorhexidine Gluconate Cloth  6 each Topical Daily   dapagliflozin propanediol  10 mg Oral Daily   digoxin  0.125 mg Oral Daily   feeding supplement  237 mL Oral TID BM   rosuvastatin  40 mg Oral Daily   sodium chloride flush  10-40 mL Intracatheter Q12H   spironolactone  12.5 mg Oral Daily   Warfarin - Pharmacist Dosing Inpatient   Does not apply q1600    Infusions:  sodium chloride 10 mL/hr at 01/06/22 0901   sodium chloride 10 mL/hr at 01/07/22 2200   ceFEPime (MAXIPIME) IV 2 g (01/14/22 0514)   magnesium sulfate bolus IVPB     vancomycin 750 mg (01/13/22 2357)    PRN Medications: sodium chloride, sodium chloride, ipratropium-albuterol, mouth rinse, oxyCODONE, sodium  chloride flush   Assessment/Plan:   1. Mixed cardiogenic/septic shock - echo 12/28/21 LVEF 10% RV sev HK - TEE 7/15 severe biventricular failure. + vegetation on mechanical MVR - Bcx - NGTD - Respiratory culture w/ moderate WBCs (mononuclear and PMN) and no organisms  - Cultures no growth.  - ID following. Continue vanc/cefepime. Now AF - Off inotropes/pressors.   2. 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 Repeat echo as above.  - Echo this admit LVEF 10% RV severely HK.  - Off milrinone and NE. - Euvolemic but wt starting to trend back up  - Increase spiro to 25 mg daily  - Continue Farxiga 10 mg daily - Continue digoxin 0.125, dig level 0.5. - If BP remains stable off midodrine, can add back home losartan soon  - Long-term only option is transplant. Dr. 10/2021 discussed with [facility] to see if he would be a candidate  in setting of pyoderma gangrenosum. They will discuss.    3. 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  - Underwent MVR with mechanical mitral valve in 05/2021 at [facility]  - TEE 7/15 severe biventricular failure. + vegetation on mechanical MVR - Bcx NGTD. -> on vanc/cefepime. 6 weeks IV abx, end date 08/24.  - Has PICC for home abx  - ID following. Appreciate assistance. - INR 2.2  (goal 2.5-3.5). Holding coumadin for possible repeat aspiration/drain placement for LUE fluid collection. Cover w/ heparin gtt    4. Acute hypoxic respiratory failure - intubated 7/15 in setting of shock - Extubated 719  - O2 stable on RA. Resolved.  5. Paroxysmal Atrial Fibrillation - In SR  - does not tolerate AF - liver enzymes continue to improve. - back on amio 200  daily  5. AKI - Due to ATN/shock - Baseline creatinine 0.98 - 1.0.  - Peak 2.6. Resolved.   6. Shock liver - continue supportive care - AST /ALT continue trend down.   7. Autoimmune Disorder  - He has seronegative spondylitis and pyoderma gangrenosum. Had been on Humira. - Followed by Rheumatology as an outpatient.   8. Hypomagnesemia/hypokalemia - Mag 1.5. Give 6 gm mag IV again today. - K 3.9. Increase spiro to 25 daily   9. Deconditioning - PT/OT following. Will need outpatient PT/OT post stroke. - Continue to mobilize.   10. 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 LUE cellulitis and pyomyositis of left axilla  - per GS, too high risk for I&D under general anesthesia - s/p IR aspiration of 100 cc thin bloody fluid from left axilla 7/31. Cx NGTD. - minimal improvement post aspiration. C/w pain and swelling. ? Further interventions. Deferring to GS and IR  - may need ID to weigh in    Length of Stay: [address] Sharol Harness, PA-C  8:56 AM   01/14/2022, 8:56 AM  Advanced Heart Failure Team Pager [phone] (M-F; 7a - 4p)  Please contact [facility] for night-coverage after hours (4p -7a ) and weekends on amion.  Patient seen with PA, agree with the above note.   Aspirated 100 cc yesterday from left axillary abscess. Upper arm softer today.    Main complaint is left arm pain, denies dyspnea.  Creatinine 0.62, INR 2.2.   General: NAD Neck: No JVD, no thyromegaly or thyroid nodule.  Lungs: Clear to auscultation bilaterally with normal respiratory effort. CV: Nondisplaced PMI.  Heart regular S1/S2, no S3/S4, mechanical S1, 1/6 SEM RUSB.  No peripheral edema.   Abdomen: Soft, nontender, no hepatosplenomegaly, no distention.  Skin: Intact without lesions or rashes.  Neurologic: Alert and oriented x 3.  Psych: Normal affect. Extremities: No clubbing or  cyanosis. LUE is swollen/enlarged.  HEENT: Normal.   Left axillary abscess s/p 100 cc aspiration yesterday, arm is softer.  No compartment syndrome, no plan for surgery at this time but surgeons following closely.  - Would hold warfarin for now in case he ends up needing debridement, continue heparin gtt with INR < 2.5.  - Vancomycin/cefepime.   Would increase spironolactone to 25 mg daily and restart home Lasix 40 mg daily.  He may be able to start [facility].  Has not been out of bed b/c hurts to put pressure on his arm.   Marca Ancona. 01/14/2022 6:39 PM

## 2022-01-14 NOTE — Progress Notes (Signed)
ANTICOAGULATION CONSULT NOTE  Pharmacy Consult for enoxaparin>warfarin > heparin Indication:  MVR /AFib  No Known Allergies  Patient Measurements: Height: 5\' 10"  (177.8 cm) Weight: 90.9 kg (200 lb 6.4 oz) IBW/kg (Calculated) : 73 Heparin Dosing Weight: 90.7 kg  Vital Signs: Temp: 99.1 F (37.3 C) (08/01 0412) Temp Source: Oral (08/01 0412) BP: 140/84 (08/01 0412) Pulse Rate: 80 (08/01 0412)  Labs: Recent Labs    01/12/22 0125 01/13/22 0430 01/14/22 0419  HGB 9.3* 8.8* 8.5*  HCT 29.2* 27.1* 27.0*  PLT 383 347 360  LABPROT 29.8* 26.7* 24.5*  INR 2.9* 2.5* 2.2*  CREATININE 0.82 0.72 0.62     Estimated Creatinine Clearance: 149 mL/min (by C-G formula based on SCr of 0.62 mg/dL).   Medical History: Past Medical History:  Diagnosis Date   Anemia    Autoimmune disorder (HCC)    pyoderma gangrenosum   CHF (congestive heart failure) (HCC)    Chronic systolic heart failure (HCC)    a. EF 35-40% by echo in 07/2018 b. EF at 45% by repeat echo in 04/2020   DVT (deep venous thrombosis) (HCC)    h/o   Dysrhythmia    Mitral regurgitation    a. 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 b. 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HCC)    arthritis   Shock, septic and cardiogenic 12/28/2021   Tricuspid regurgitation    Assessment: 35 yom with a history of mitral regurgitation s/p mechanical valve replacement on Warfarin, HF, AF and prior DVT, and Hx of hidradenitis and pyoderma gangrenosum .   Patient taking warfarin PTA 4mg  daily per last AC note. L arm swelling s/p aspiration in IR 7/31 > may need further eval  Hold warfarin for now INR 2.2 < goal CBC stable  Will start IV heparin drip for ease of on/off for procedures   Goal of Therapy:  INR  goal 2.5-3.5 Monitor platelets by anticoagulation protocol: Yes   Plan:  Hold warfarin  Heparin drip 1600 uts/hr  Draw heparin level 6hr after  start  Daily heparin level INR and CBC    Pharm.D. CPP, BCPS Clinical Pharmacist [phone] 01/14/2022 11:37 AM   Please check AMION for all [facility] numbers 01/14/2022

## 2022-01-14 NOTE — Progress Notes (Signed)
Patient has abscess on Lt. Shoulder area and swelling whole Lt. Arm with painful. Patient has single lumen PICC which is not supposed to place a PIV access. Talked Dr. Barb Merino this matter. Dr. Barb Merino is okay to place a PIV access on Rt. Arm which is same arm with PICC. Informed patient's RN this matter. Placed the PIV access on Rt. Arm. HS [employer]

## 2022-01-14 NOTE — Progress Notes (Signed)
Daily Progress Note Intern Pager: [phone]  Patient name: Donald [name] Medical record number: [id number] Date of birth: March 22, 1986 Age: 36 y.o. Gender: male  Primary Care Provider: No primary care provider on file. Consultants: Pulm critical care, ID, cardiology, heart failure team, pharmacy Code Status: FULL  Pt Overview and Major Events to Date:  - 714 admitted FPTS for sepsis, N/V. Started on flagyl, cefepime, vanc -7/15 worsening shock> ICU> intubated, pressors, abx. Flagyl stopped -7/16 decreasing milrinone for high CO and co-ox -7/19 extubated -7/21 drop in cardiac index, Levophed restarted, Swan discontinued -7/22 levophed stopped -7/23 milrinone stopped -7/24 out to FMTS -7/27 acute stroke  and swelling of L upper arm, CT w/ pyomyositis -7/31 aspiration of L upper arm completed (GS negative, cultures pending)  Assessment and Plan: Donald [name] is a 36 y.o. male who presented with n/v/d and found to have prosthetic MV endocarditis. Pertinent PMHx includes MR s/p MV mechanical valve, on warfarin, biventricular HF, and PAF  * Endocarditis of prosthetic mitral valve (HCC) Stable, afebrile. INR 2.2 (8/1) - Continue vancomycin and cefepime through PICC.  Patient is set up to go home with home antibiotic infusions. End date 8/27. - warfarin held per pharmacy, heparin drip started  Left arm pain and swelling CT with contrast concerning for cellulitis and pyomyositis of left axilla extending to coracobrachialis.  Surgery consulted, attempted bedside aspiration without success, decline further surgical management at this time given high risk on anticoagulation. IR following, aspiration completed, GS negative, no organisms, cultures pending -F/u IR recs, INR -Continue broad spectrum abx as above, discussed regimen with ID 7/30 -increased from 15.5 to 16 in in diameter s/p aspiration, decreased ROM, pulse, sensation - MRI humerus with IV contrast ordered  Acute  embolic stroke (HCC) Stable.  CTA head/neck negative for aneurysm, so stroke team recommended to continue current antithrombotics. Crestor 40 mg daily started 7/28.   -Stroke team signed off, plan to follow-up with stroke clinic NP in [facility] in about 4 weeks.  Paroxysmal atrial fibrillation (HCC) Stable, rate controlled. -Continue amiodarone 200 mg daily -Cardiology following, appreciate conditions.   Chronic heart failure (HCC) Stable at this time.  Lasix held per heart failure team.  - spironolactone increased from 12.5mg  to 25 mg (8/1) - Continue digoxin and Farxiga - Wean midodrine per heart failure team - Appreciate heart failure team care and recommendations   Iron deficiency anemia Stable, chronic.  Hemoglobin near baseline.  Continue to monitor. - start iron supplementation (ferrous sulfate 325 mg, every other day)  Prolonged QT interval Last QTc 495 on 7/29  Hypomagnesemia Mg 1.5 (8/1) -4g IV Mg given -AM Mg  AKI (acute kidney injury) (HCC)-resolved as of 01/07/2022 Resolved with improving shock. -Continue fluids, PO as tolerated -AM BMP  Shock, septic and cardiogenic-resolved as of 01/11/2022 Resolved since out of ICU. On vanc, cefepime, midodrine. -Monitor VS -Continue abx per endocarditis  Gastroenteritis-resolved as of 01/11/2022     FEN/GI: Heart healthy Bowel regimen: scheduled senna, PEG PPx: Holding for now pending HF team recommendations Dispo:Home pending clinical improvement . Pending cultures, HF and ID recs  Subjective:  Patient feeling fine. Worried about LUE, feels like it is getting worse. Endorses constipation.  Objective: Temp:  [98.5 F (36.9 C)-99.1 F (37.3 C)] 98.5 F (36.9 C) (08/01 1200) Pulse Rate:  [77-86] 82 (08/01 1200) Resp:  [15-36] 20 (08/01 1200) BP: (120-175)/(64-96) 161/96 (08/01 1200) SpO2:  [96 %-100 %] 100 % (08/01 1200) Weight:  [  200 lb 6.4 oz (90.9 kg)] 200 lb 6.4 oz (90.9 kg) (08/01  0412) Physical Exam: General: Alert, oriented, in NAD Skin: Warm, dry HEENT: normocephalic, atraumatic Cardiovascular: Regular rate.  Respiratory: Regular rate on RA Abdomen: Nondistended Extremities: Unable to spontaneously move LUE, swollen around bicep. LUE warm, +1 pulses, no tenderness on palpation, but tender on passive ROM  Laboratory: Most recent CBC Lab Results  Component Value Date   WBC 8.8 01/14/2022   HGB 8.5 (L) 01/14/2022   HCT 27.0 (L) 01/14/2022   MCV 82.3 01/14/2022   PLT 360 01/14/2022   Most recent BMP    Latest Ref Rng & Units 01/14/2022    4:19 AM  BMP  Glucose 70 - 99 mg/dL 953   BUN 6 - 20 mg/dL 14   Creatinine 2.02 - 1.24 mg/dL 3.34   Sodium 356 - 861 mmol/L 131   Potassium 3.5 - 5.1 mmol/L 3.9   Chloride 98 - 111 mmol/L 101   CO2 22 - 32 mmol/L 24   Calcium 8.9 - 10.3 mg/dL 8.8     Other pertinent labs: INR 2.2 Mg 1.5  [name], MD 01/14/2022, 1:21 PM  PGY-1, [facility] FPTS Intern pager: [phone], text pages welcome Secure chat group [facility] Teaching Service

## 2022-01-14 NOTE — Plan of Care (Signed)
Pt alert and oriented x 4. Pt noted at shift report guarding LUE and no movement. Pt states the movement has been limited to none the last 2 days and MD is aware. Notices muscle movement. When turning extremity is not flaccid.  Problem: Activity: Goal: Capacity to carry out activities will improve Outcome: Not Progressing   Problem: Education: Goal: Knowledge of General Education information will improve Description: Including pain rating scale, medication(s)/side effects and non-pharmacologic comfort measures Outcome: Progressing   Problem: Health Behavior/Discharge Planning: Goal: Ability to manage health-related needs will improve Outcome: Progressing   Problem: Clinical Measurements: Goal: Ability to maintain clinical measurements within normal limits will improve Outcome: Progressing Goal: Will remain free from infection Outcome: Progressing Goal: Diagnostic test results will improve Outcome: Progressing Goal: Cardiovascular complication will be avoided Outcome: Progressing   Problem: Activity: Goal: Risk for activity intolerance will decrease Outcome: Progressing   Problem: Nutrition: Goal: Adequate nutrition will be maintained Outcome: Progressing   Problem: Elimination: Goal: Will not experience complications related to bowel motility Outcome: Progressing Goal: Will not experience complications related to urinary retention Outcome: Progressing   Problem: Pain Managment: Goal: General experience of comfort will improve Outcome: Progressing   Problem: Safety: Goal: Ability to remain free from injury will improve Outcome: Progressing   Problem: Skin Integrity: Goal: Risk for impaired skin integrity will decrease Outcome: Progressing   Problem: Education: Goal: Ability to demonstrate management of disease process will improve Outcome: Progressing Goal: Ability to verbalize understanding of medication therapies will improve Outcome: Progressing Goal:  Individualized Educational Video(s) Outcome: Progressing   Problem: Cardiac: Goal: Ability to achieve and maintain adequate cardiopulmonary perfusion will improve Outcome: Progressing   Problem: Activity: Goal: Ability to tolerate increased activity will improve Outcome: Progressing   Problem: Education: Goal: Ability to describe self-care measures that may prevent or decrease complications (Diabetes Survival Skills Education) will improve Outcome: Progressing Goal: Individualized Educational Video(s) Outcome: Progressing   Problem: Coping: Goal: Ability to adjust to condition or change in health will improve Outcome: Progressing   Problem: Fluid Volume: Goal: Ability to maintain a balanced intake and output will improve Outcome: Progressing   Problem: Health Behavior/Discharge Planning: Goal: Ability to identify and utilize available resources and services will improve Outcome: Progressing Goal: Ability to manage health-related needs will improve Outcome: Progressing   Problem: Metabolic: Goal: Ability to maintain appropriate glucose levels will improve Outcome: Progressing   Problem: Nutritional: Goal: Maintenance of adequate nutrition will improve Outcome: Progressing Goal: Progress toward achieving an optimal weight will improve Outcome: Progressing   Problem: Skin Integrity: Goal: Risk for impaired skin integrity will decrease Outcome: Progressing   Problem: Tissue Perfusion: Goal: Adequacy of tissue perfusion will improve Outcome: Progressing

## 2022-01-14 NOTE — TOC CM/SW Note (Signed)
HF TOC CM spoke to [employer] Infusion Coordinator, Pam RN with update. Home IV abx is arranged, waiting dc date. [employer] will restart home IV abx on day of dc. Isidoro Donning RN3 CCM, Heart Failure TOC CM [phone]

## 2022-01-14 NOTE — Progress Notes (Signed)
Patient left arm at bicep is 16 inches around at bi-cep, painful to touch

## 2022-01-14 NOTE — Progress Notes (Addendum)
FMTS Interim Progress Note  S: Myself and Dr. Wynelle Link to check on patient. Patient reports there has been no new changes to his arm over the course of the day. He reports still being in significant pain with any movement over his upper arm; however from distal bicep and below he is unable to feel or move his muscles in hand wrist and fingers. States that pain is controlled on oxycodone.   O: BP (!) 140/85 (BP Location: Left Leg)   Pulse (!) 40   Temp 98.3 F (36.8 C) (Oral)   Resp (!) 21   Ht 5\' 10"  (1.778 m)   Wt 90.9 kg   SpO2 100%   BMI 28.75 kg/m    General: Lying in hospital bed in no acute distress Cardiovascular: regular rate and rhythm, mechanical valve sounds Respiratory: CTAB, normal WOB on RA MSK/Extremities: LUE and RUE 2+ pulses, LUE: no erythema, moderate swelling around bicep and triceps, bicep non tender to palpation, firm on palpation of proximal biceps, pain with passive ROM of elbow and shoulder, ROM severely limited due to pain Neuro: Grip strength 0/5 LUE, Cranial Nerve XI intact, decreased sensation of LUE distal to elbow   A/P: Left arm pain and swelling Stable, unchanged over course of today. Pain controlled. Continue to evaluate with further imaging.  -MRI Humerus w/IV contrast -Follow-up cultures of aspirate -General surgery following, recs appreciated -Monitor closely -Continue oxycodone 5mg  q3h PRN  , MD 01/14/2022, 8:23 PM PGY-1, [facility] Family Medicine Service pager [phone]

## 2022-01-14 NOTE — Progress Notes (Signed)
Nutrition Follow-up  DOCUMENTATION CODES:   Not applicable  INTERVENTION:   Continue Ensure Enlive po TID, each supplement provides 350 kcal and 20 grams of protein.  Add chocolate Magic cup TID with meals, each supplement provides 290 kcal and 9 grams of protein.  NUTRITION DIAGNOSIS:   Inadequate oral intake related to acute illness as evidenced by NPO status.  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 Patient has been eating well and drinks 3 Ensure supplements per day. Recent meal intakes not recorded, but patient states he is eating 75-100% of meals. Mom asked about an ice cream supplement. Patient would like to try chocolate magic cups with meals, RD to order.   Labs reviewed. Na 131, mag 1.5  Medications reviewed and include ferrous sulfate, Miralax, Senokot, Aldactone, mag sulfate.  Weight trending back up. Currently 90.9 kg, 90.7 kg on admission, lowest weight 87 kg on 7/28.  Diet Order:   Diet Order             Diet Heart Room service appropriate? Yes; Fluid consistency: Thin  Diet effective now           Diet - low sodium heart healthy                   EDUCATION NEEDS:   Not appropriate for education at this time  Skin:  Skin Assessment: Skin Integrity Issues: Skin Integrity Issues:: Other (Comment) Other: Chronic wounds related to hidradenitis suppurativa to  bilateral LE, buttocks  Last BM:  7/28  Height:   Ht Readings from  Last 1 Encounters:  12/27/21 5\' 10"  (1.778 m)    Weight:   Wt Readings from Last 1 Encounters:  01/14/22 90.9 kg    BMI:  Body mass index is 28.75 kg/m.  Estimated Nutritional Needs:   Kcal:  2200-2400 kcals  Protein:  115-140 g  Fluid:  <1.8 L   03/16/22 RD, LDN, CNSC Please refer to Amion for contact information.

## 2022-01-14 NOTE — Progress Notes (Signed)
Physical Therapy Treatment Patient Details Name: Donald [name] MRN: [id number] DOB: Oct 27, 1985 Today's Date: 01/14/2022   History of Present Illness Pt is a 36 y.o. 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 Transfer to ICU 7/15 with cardiogenic and septic shock; ETT 7/15-7/19. 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    PT Comments    Pt received supine and agreeable to session, however pt continues to be limited by LUE pain, despite pre-medication. Pt with short bout of ambulation in room, with distance limited to max c/o LUE pain. Pt with tendency to hold LUE in flexion and adduction against body, encouraged pt to let arm hang at side during ambulation, pt unable 2/2 pain. LUE elevated in extension at end of session, educated pt on importance of positioning to avoid contractures and gentle ROM throughout day within tolerance, with pt verbalizing understanding.Pt continues to benefit from skilled PT services to progress toward functional mobility goals.    Recommendations for follow up therapy are one component of a multi-disciplinary discharge planning process, led by the attending physician.  Recommendations may be updated based on patient status, additional functional criteria and insurance authorization.  Follow Up Recommendations  No PT follow up     Assistance Recommended at Discharge Intermittent Supervision/Assistance  Patient can return home with the following Assistance with cooking/housework;Direct supervision/assist for medications management;Direct supervision/assist for financial management;Assist for transportation   Equipment Recommendations  None recommended by PT     Recommendations for Other Services       Precautions / Restrictions Precautions Precautions: Fall Precaution Comments: sacral wounds Restrictions Weight Bearing Restrictions: No     Mobility  Bed Mobility Overal bed mobility: Modified Independent Bed Mobility: Supine to Sit, Sit to Supine           General bed mobility comments: supine<>EOB without physical assist with increased timesecondary to pain    Transfers Overall transfer level: Needs assistance Equipment used: None Transfers: Sit to/from Stand Sit to Stand: Supervision           General transfer comment: suerpvision for safety, increased time needed    Ambulation/Gait Ambulation/Gait assistance: Supervision Gait Distance (Feet): 20 Feet Assistive device: None Gait Pattern/deviations: Step-through pattern, Decreased stride length       General Gait Details: slow, steady gait without DME, pt crying from LUE pain, pt cradling LUE needing cues to let arm hand at side pt requesting back to bed   Stairs             Wheelchair Mobility    Modified Rankin (Stroke Patients Only)       Balance Overall balance assessment: Needs assistance Sitting-balance support: No upper extremity supported Sitting balance-Leahy Scale: Good     Standing balance support: No upper extremity supported, During functional activity Standing balance-Leahy Scale: Good Standing balance comment: able to static stand without UE support                            Cognition Arousal/Alertness: Awake/alert Behavior During Therapy: WFL for tasks assessed/performed, Flat affect Overall Cognitive Status: Within Functional Limits for tasks assessed Area of Impairment: Attention, Awareness  Current Attention Level: Selective Memory: Decreased short-term memory Following Commands: Follows one step commands consistently Safety/Judgement: Decreased awareness of safety, Decreased awareness  of deficits Awareness: Emergent Problem Solving: Slow processing, Requires verbal cues General Comments: able to follow directions with increased safety        Exercises General Exercises - Upper Extremity Elbow Flexion: PROM, Left, 10 reps, Supine Elbow Extension: PROM, Left, 10 reps, Supine    General Comments General comments (skin integrity, edema, etc.): pt with max LUE pain with ambulation limited seconday to. Gentle ROM of L elbow and postioned LUE elevated in extension at end of sesison as pt holding arm with elbew flexed and at stomach throughout session and on entry, educated pt on importance of positioning to avoid contractures and gentle ROM throughout day within tolerance      Pertinent Vitals/Pain Pain Assessment Pain Assessment: Faces Faces Pain Scale: Hurts worst Pain Location: LUE with mobility Pain Descriptors / Indicators: Grimacing, Guarding, Crying Pain Intervention(s): Premedicated before session, Monitored during session, Limited activity within patient's tolerance, Repositioned    Home Living                          Prior Function            PT Goals (current goals can now be found in the care plan section) Acute Rehab PT Goals PT Goal Formulation: With family Time For Goal Achievement: 01/16/22    Frequency    Min 2X/week      PT Plan      Co-evaluation              AM-PAC PT "6 Clicks" Mobility   Outcome Measure  Help needed turning from your back to your side while in a flat bed without using bedrails?: None Help needed moving from lying on your back to sitting on the side of a flat bed without using bedrails?: None Help needed moving to and from a bed to a chair (including a wheelchair)?: A Little Help needed standing up from a chair using your arms (e.g., wheelchair or bedside chair)?: A Little Help needed to walk in hospital room?: A Little Help needed climbing 3-5 steps with a railing? : A Little 6 Click Score:  20    End of Session   Activity Tolerance: Patient limited by pain Patient left: with call bell/phone within reach;with family/visitor present;in bed Nurse Communication: Mobility status PT Visit Diagnosis: Other abnormalities of gait and mobility (R26.89);Difficulty in walking, not elsewhere classified (R26.2);Muscle weakness (generalized) (M62.81)     Time: 0981-1914 PT Time Calculation (min) (ACUTE ONLY): 20 min  Charges:  $Therapeutic Activity: 8-22 mins                     Joelle Roswell R. PTA Acute Rehabilitation Services Office: [phone]   Catalina Antigua 01/14/2022, 10:56 AM

## 2022-01-14 NOTE — Progress Notes (Signed)
ANTICOAGULATION CONSULT NOTE  Pharmacy Consult for enoxaparin>warfarin > heparin Indication:  MVR /AFib  No Known Allergies  Patient Measurements: Height: 5\' 10"  (177.8 cm) Weight: 90.9 kg (200 lb 6.4 oz) IBW/kg (Calculated) : 73 Heparin Dosing Weight: 90.7 kg  Vital Signs: Temp: 98.7 F (37.1 C) (08/01 2332) Temp Source: Oral (08/01 2332) BP: 135/77 (08/01 2332) Pulse Rate: 79 (08/01 2332)  Labs: Recent Labs    01/12/22 0125 01/13/22 0430 01/14/22 0419 01/14/22 2218  HGB 9.3* 8.8* 8.5*  --   HCT 29.2* 27.1* 27.0*  --   PLT 383 347 360  --   LABPROT 29.8* 26.7* 24.5*  --   INR 2.9* 2.5* 2.2*  --   HEPARINUNFRC  --   --   --  0.26*  CREATININE 0.82 0.72 0.62  --      Estimated Creatinine Clearance: 149 mL/min (by C-G formula based on SCr of 0.62 mg/dL).   Medical History: Past Medical History:  Diagnosis Date   Anemia    Autoimmune disorder (HCC)    pyoderma gangrenosum   CHF (congestive heart failure) (HCC)    Chronic systolic heart failure (HCC)    a. EF 35-40% by echo in 07/2018 b. EF at 45% by repeat echo in 04/2020   DVT (deep venous thrombosis) (HCC)    h/o   Dysrhythmia    Mitral regurgitation    a. 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 b. 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HCC)    arthritis   Shock, septic and cardiogenic 12/28/2021   Tricuspid regurgitation    Assessment: Donald [name] with a history of mitral regurgitation s/p mechanical valve replacement on Warfarin, HF, AF and prior DVT, and Hx of hidradenitis and pyoderma gangrenosum .   Patient taking warfarin PTA 4mg  daily per last AC note. L arm swelling s/p aspiration in IR 7/31 > may need further eval  Hold warfarin for now INR 2.2 < goal CBC stable  Will  start IV heparin drip for ease of on/off for procedures   PM update: Heparin level of 0.26 is subtherapeutic on heparin 1600 units/hr. Level drawn appropriately per RN. No bleeding noted per RN.   Goal of Therapy:  INR goal 2.5-3.5 Heparin level 0.3-0.7 units/ml Monitor platelets by anticoagulation protocol: Yes   Plan:  Increase heparin to 1750 units/hr Check 6 hr heparin level    , PharmD, BCPS Clinical Pharmacist 01/14/2022 11:42 PM

## 2022-01-14 NOTE — Progress Notes (Signed)
CARDIAC REHAB PHASE I   Offered to walk with pt. Pt declining at this time, stating too much pain in arm. Stressed importance of ambulation, especially in the setting of an extended hospital stay. Will continue to follow and encourage ambulation as able.  [id number] Reynold Bowen, RN BSN 01/14/2022 1:38 PM

## 2022-01-14 NOTE — Progress Notes (Signed)
Subjective: CC: LUE pain reported as about the same. Dr. Deanne Coffer aspirated 100 cc bloody fluid yesterday. Cx NGTD.  afebrile Objective: Vital signs in last 24 hours: Temp:  [98.4 F (36.9 C)-99.1 F (37.3 C)] 99.1 F (37.3 C) (08/01 0412) Pulse Rate:  [77-86] 80 (08/01 0412) Resp:  [15-36] 20 (08/01 0412) BP: (120-175)/(64-104) 140/84 (08/01 0412) SpO2:  [96 %-100 %] 100 % (08/01 0412) Weight:  [90.9 kg] 90.9 kg (08/01 0412) Last BM Date : 01/10/22  Intake/Output from previous day: 07/31 0701 - 08/01 0700 In: 250 [I.V.:100; IV Piggyback:150] Out: 1350 [Urine:1350] Intake/Output this shift: No intake/output data recorded.  PE: Gen:  Alert, NAD, pleasant Msk: There is induration of left axilla and swelling of the LUE - no appreciable bleeding from aspiration site. I feel there is slight interval decrease in swelling of left upper arm - compartments feel softer. There is still mild swelling of the forearm. No bony tenderness over elbow or wrist.   Lab Results:  Recent Labs    01/13/22 0430 01/14/22 0419  WBC 8.9 8.8  HGB 8.8* 8.5*  HCT 27.1* 27.0*  PLT 347 360   BMET Recent Labs    01/13/22 0430 01/14/22 0419  NA 131* 131*  K 3.8 3.9  CL 101 101  CO2 23 24  GLUCOSE 90 101*  BUN 15 14  CREATININE 0.72 0.62  CALCIUM 8.9 8.8*   PT/INR Recent Labs    01/13/22 0430 01/14/22 0419  LABPROT 26.7* 24.5*  INR 2.5* 2.2*   CMP     Component Value Date/Time   NA 131 (L) 01/14/2022 0419   K 3.9 01/14/2022 0419   CL 101 01/14/2022 0419   CO2 24 01/14/2022 0419   GLUCOSE 101 (H) 01/14/2022 0419   BUN 14 01/14/2022 0419   CREATININE 0.62 01/14/2022 0419   CALCIUM 8.8 (L) 01/14/2022 0419   PROT 8.7 (H) 01/08/2022 0525   ALBUMIN 2.5 (L) 01/08/2022 0525   AST 240 (H) 01/08/2022 0525   ALT 294 (H) 01/08/2022 0525   ALKPHOS 129 (H) 01/08/2022 0525   BILITOT 1.0 01/08/2022 0525   GFRNONAA >60 01/14/2022 0419   GFRAA >60 01/09/2015 1630   Lipase      Component Value Date/Time   LIPASE 31 07/22/2021 0248    Studies/Results: [place] FINE NEEDLE ASP 1ST LESION  Result Date: 01/13/2022 INDICATION: Complex deep collection in the anterior compartment left upper arm. EXAM: ULTRASOUND ASPIRATION LEFT UPPER EXTREMITY MEDICATIONS: Lidocaine 1% subcutaneous ANESTHESIA/SEDATION: Intravenous Fentanyl and Versed 2mg  were administered as conscious sedation during continuous monitoring of the patient's level of consciousness and physiological / cardiorespiratory status by the radiology RN, with a total moderate sedation time of 14 minutes. COMPLICATIONS: None immediate. PROCEDURE: Informed written consent was obtained from the patient after a thorough discussion of the procedural risks, benefits and alternatives. All questions were addressed. Maximal Sterile Barrier Technique was utilized including caps, mask, sterile gowns, sterile gloves, sterile drape, hand hygiene and skin antiseptic. A timeout was performed prior to the initiation of the procedure. Survey of the upper arm was performed. Complex deep intramuscular collection was localized, with fluid levels. An appropriate skin entry site was determined and marked. After sedation and lidocaine administration, 7 cm multi sidehole Yueh sheath needle advanced to the collection. Approximately 100 mL of thin bloody fluid were aspirated, sent for Gram stain and culture. Follow-up scan shows decrease in the size of the overall collection with some residual loculated fluid.  The patient tolerated the procedure well. IMPRESSION: 1. Technically successful ultrasound-guided aspiration of left upper arm intramuscular collection, returning 100 mL thin bloody fluid, sent for Gram stain and culture. Electronically Signed   By: Corlis Leak M.D.   On: 01/13/2022 16:26    Anti-infectives: Anti-infectives (From admission, onward)    Start     Dose/Rate Route Frequency Ordered Stop   01/08/22 0000  ceFEPime (MAXIPIME) IVPB         2 g Intravenous Every 8 hours 01/08/22 1224 02/08/22 2359   01/08/22 0000  vancomycin IVPB        750 mg Intravenous Every 12 hours 01/08/22 1224 02/07/22 2359   01/07/22 2328  vancomycin (VANCOREADY) IVPB 750 mg/150 mL        750 mg 150 mL/hr over 60 Minutes Intravenous Every 12 hours 01/07/22 1140     12/30/21 1400  ceFEPIme (MAXIPIME) 2 g in sodium chloride 0.9 % 100 mL IVPB        2 g 200 mL/hr over 30 Minutes Intravenous Every 8 hours 12/30/21 0710 02/06/22 2359   12/30/21 1000  vancomycin (VANCOREADY) IVPB 750 mg/150 mL  Status:  Discontinued        750 mg 150 mL/hr over 60 Minutes Intravenous Every 8 hours 12/30/21 0713 01/07/22 1140   12/29/21 1015  vancomycin (VANCOREADY) IVPB 1250 mg/250 mL  Status:  Discontinued        1,250 mg 166.7 mL/hr over 90 Minutes Intravenous Every 24 hours 12/29/21 0915 12/30/21 0713   12/28/21 1700  ceFEPIme (MAXIPIME) 2 g in sodium chloride 0.9 % 100 mL IVPB  Status:  Discontinued        2 g 200 mL/hr over 30 Minutes Intravenous Every 12 hours 12/28/21 1227 12/30/21 0710   12/28/21 1227  vancomycin variable dose per unstable renal function (pharmacist dosing)  Status:  Discontinued         Does not apply See admin instructions 12/28/21 1227 12/29/21 1233   12/28/21 0600  vancomycin (VANCOREADY) IVPB 1250 mg/250 mL  Status:  Discontinued        1,250 mg 166.7 mL/hr over 90 Minutes Intravenous Every 12 hours 12/27/21 1812 12/28/21 1235   12/27/21 1815  ceFEPIme (MAXIPIME) 2 g in sodium chloride 0.9 % 100 mL IVPB  Status:  Discontinued        2 g 200 mL/hr over 30 Minutes Intravenous Every 8 hours 12/27/21 1812 12/28/21 1227   12/27/21 1645  ceFEPIme (MAXIPIME) 2 g in sodium chloride 0.9 % 100 mL IVPB  Status:  Discontinued        2 g 200 mL/hr over 30 Minutes Intravenous  Once 12/27/21 1634 12/27/21 1644   12/27/21 1645  metroNIDAZOLE (FLAGYL) IVPB 500 mg        500 mg 100 mL/hr over 60 Minutes Intravenous  Once 12/27/21 1634 12/27/21 1944    12/27/21 1645  vancomycin (VANCOCIN) IVPB 1000 mg/200 mL premix  Status:  Discontinued        1,000 mg 200 mL/hr over 60 Minutes Intravenous  Once 12/27/21 1634 12/27/21 1643   12/27/21 1645  vancomycin (VANCOREADY) IVPB 1750 mg/350 mL        1,750 mg 175 mL/hr over 120 Minutes Intravenous  Once 12/27/21 1643 12/27/21 2150   12/27/21 1645  ceFEPIme (MAXIPIME) 2 g in sodium chloride 0.9 % 100 mL IVPB  Status:  Discontinued        2 g 200 mL/hr over 30 Minutes Intravenous Every  8 hours 12/27/21 1644 12/27/21 1812        Assessment/Plan 36 year old male with multiple medical co-morbidities including, but not limited to, acute right MCA stroke, endocarditis of prosthetic mitral valve, and heart failure with reduced ejection fraction on oral anticoagulation who has a left axillary/upper arm abscess with cellulitis on exam and pyomyositis on CT.  Given his acute on chronic heart failure and need for anticoagulation We think placing this patient under a general anesthetic for incision and drainage of his axilla would be high risk, cardiology team confirms this risk.  -  Attempted bedside aspiration on 7/28 without success -  Increasing LUE swelling >> IR aspiration of 100 cc thin bloody fluid 7/31, follow Cx  -  Exam slightly improved today, I dont seen an urgent indication for surgery today. We will follow his exam closely. Ok from [place] to resume anticoagulation  -  Continue broad-spectrum antibiotics for now.   LOS: 17 days    [facility] 01/14/2022, 7:52 AM Please see Amion for pager number during day hours 7:00am-4:30pm

## 2022-01-15 ENCOUNTER — Inpatient Hospital Stay (HOSPITAL_COMMUNITY): Payer: Medicaid Other

## 2022-01-15 DIAGNOSIS — Z952 Presence of prosthetic heart valve: Secondary | ICD-10-CM | POA: Diagnosis not present

## 2022-01-15 DIAGNOSIS — I63411 Cerebral infarction due to embolism of right middle cerebral artery: Secondary | ICD-10-CM | POA: Diagnosis not present

## 2022-01-15 DIAGNOSIS — M79602 Pain in left arm: Secondary | ICD-10-CM | POA: Diagnosis not present

## 2022-01-15 DIAGNOSIS — R509 Fever, unspecified: Secondary | ICD-10-CM | POA: Diagnosis not present

## 2022-01-15 DIAGNOSIS — I5043 Acute on chronic combined systolic (congestive) and diastolic (congestive) heart failure: Secondary | ICD-10-CM | POA: Diagnosis not present

## 2022-01-15 DIAGNOSIS — I38 Endocarditis, valve unspecified: Secondary | ICD-10-CM | POA: Diagnosis not present

## 2022-01-15 DIAGNOSIS — R2232 Localized swelling, mass and lump, left upper limb: Secondary | ICD-10-CM | POA: Diagnosis not present

## 2022-01-15 DIAGNOSIS — Z7901 Long term (current) use of anticoagulants: Secondary | ICD-10-CM | POA: Diagnosis not present

## 2022-01-15 DIAGNOSIS — T826XXA Infection and inflammatory reaction due to cardiac valve prosthesis, initial encounter: Secondary | ICD-10-CM | POA: Diagnosis not present

## 2022-01-15 DIAGNOSIS — I429 Cardiomyopathy, unspecified: Secondary | ICD-10-CM | POA: Diagnosis not present

## 2022-01-15 DIAGNOSIS — L88 Pyoderma gangrenosum: Secondary | ICD-10-CM | POA: Diagnosis not present

## 2022-01-15 DIAGNOSIS — M7989 Other specified soft tissue disorders: Secondary | ICD-10-CM | POA: Diagnosis not present

## 2022-01-15 LAB — BASIC METABOLIC PANEL
Anion gap: 7 (ref 5–15)
BUN: 11 mg/dL (ref 6–20)
CO2: 23 mmol/L (ref 22–32)
Calcium: 8.8 mg/dL — ABNORMAL LOW (ref 8.9–10.3)
Chloride: 101 mmol/L (ref 98–111)
Creatinine, Ser: 0.67 mg/dL (ref 0.61–1.24)
GFR, Estimated: >60 mL/min (ref >60)
Glucose, Bld: 97 mg/dL (ref 70–99)
Potassium: 4 mmol/L (ref 3.5–5.1)
Sodium: 131 mmol/L — ABNORMAL LOW (ref 135–145)

## 2022-01-15 LAB — CBC
HCT: 26.8 % — ABNORMAL LOW (ref 39.0–52.0)
Hemoglobin: 8.4 g/dL — ABNORMAL LOW (ref 13.0–17.0)
MCH: 25.6 pg — ABNORMAL LOW (ref 26.0–34.0)
MCHC: 31.3 g/dL (ref 30.0–36.0)
MCV: 81.7 fL (ref 80.0–100.0)
Platelets: 328 10*3/uL (ref 150–400)
RBC: 3.28 MIL/uL — ABNORMAL LOW (ref 4.22–5.81)
RDW: 17.6 % — ABNORMAL HIGH (ref 11.5–15.5)
WBC: 8.5 10*3/uL (ref 4.0–10.5)
nRBC: 0 % (ref 0.0–0.2)

## 2022-01-15 LAB — HEPARIN LEVEL (UNFRACTIONATED): Heparin Unfractionated: 0.33 IU/mL (ref 0.30–0.70)

## 2022-01-15 LAB — PROTIME-INR
INR: 2.3 — ABNORMAL HIGH (ref 0.8–1.2)
Prothrombin Time: 25.2 seconds — ABNORMAL HIGH (ref 11.4–15.2)

## 2022-01-15 LAB — MAGNESIUM: Magnesium: 1.5 mg/dL — ABNORMAL LOW (ref 1.7–2.4)

## 2022-01-15 MED ORDER — SENNA 8.6 MG PO TABS
1.0000 | ORAL_TABLET | Freq: Two times a day (BID) | ORAL | Status: DC
  Administered 2022-01-15 – 2022-01-16 (×3): 8.6 mg via ORAL
  Filled 2022-01-15 (×3): qty 1

## 2022-01-15 MED ORDER — SACUBITRIL-VALSARTAN 24-26 MG PO TABS
1.0000 | ORAL_TABLET | Freq: Two times a day (BID) | ORAL | Status: DC
  Administered 2022-01-15 – 2022-01-16 (×4): 1 via ORAL
  Filled 2022-01-15 (×4): qty 1

## 2022-01-15 MED ORDER — WARFARIN - PHARMACIST DOSING INPATIENT: Freq: Every day | Status: DC

## 2022-01-15 MED ORDER — POLYETHYLENE GLYCOL 3350 17 G PO PACK
17.0000 g | PACK | Freq: Two times a day (BID) | ORAL | Status: DC
  Administered 2022-01-15 – 2022-01-16 (×3): 17 g via ORAL
  Filled 2022-01-15 (×3): qty 1

## 2022-01-15 MED ORDER — WARFARIN SODIUM 5 MG PO TABS
6.0000 mg | ORAL_TABLET | Freq: Once | ORAL | Status: AC
  Administered 2022-01-15: 6 mg via ORAL
  Filled 2022-01-15: qty 1

## 2022-01-15 MED ORDER — ACETAMINOPHEN 325 MG PO TABS
650.0000 mg | ORAL_TABLET | Freq: Four times a day (QID) | ORAL | Status: DC
  Administered 2022-01-15 – 2022-01-17 (×9): 650 mg via ORAL
  Filled 2022-01-15 (×9): qty 2

## 2022-01-15 MED ORDER — MAGNESIUM SULFATE 4 GM/100ML IV SOLN
4.0000 g | Freq: Once | INTRAVENOUS | Status: AC
Start: 2022-01-15 — End: 2022-01-16
  Administered 2022-01-15: 4 g via INTRAVENOUS
  Filled 2022-01-15: qty 100

## 2022-01-15 MED ORDER — GADOBUTROL 1 MMOL/ML IV SOLN
9.0000 mL | Freq: Once | INTRAVENOUS | Status: AC | PRN
  Administered 2022-01-15: 9 mL via INTRAVENOUS

## 2022-01-15 MED ORDER — ALTEPLASE 2 MG IJ SOLR: 2.0000 mg | Freq: Once | INTRAMUSCULAR | Status: DC

## 2022-01-15 NOTE — Consult Note (Signed)
Reason for Consult:LUE swelling Referring Physician: Violeta Gelinas Time called: 0825 Time at bedside: 0916   Donald [name] is an 36 y.o. male.  HPI: Donald [name] has been having pain and swelling of his left arm since the middle of last week. CT scan earlier this week showed an axillary/chest wall fluid collection with some extension into the proximal upper arm. GS was consulted and recommended IR drainage and about thin bloody fluid aspirated and culture has been negative. His arm swelled up more and became more painful over last 2d or so. MRI was performed that showed enlargement of the extremity component of the fluid collection and orthopedic surgery was consulted. 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  Past Medical History:  Diagnosis Date   Anemia    Autoimmune disorder (HCC)    pyoderma gangrenosum   CHF (congestive heart failure) (HCC)    Chronic systolic heart failure (HCC)    a. EF 35-40% by echo in 07/2018 b. EF at 45% by repeat echo in 04/2020   DVT (deep venous thrombosis) (HCC)    h/o   Dysrhythmia    Mitral regurgitation    a. 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 b. 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HCC)    arthritis   Shock, septic and cardiogenic 12/28/2021   Tricuspid regurgitation     Past Surgical History:  Procedure Laterality Date   CARDIAC CATHETERIZATION     HERNIA REPAIR Right 2018   MITRAL VALVE REPAIR  06/07/2018   [facility] - Dr Meda Klinefelter   MITRAL VALVE REPLACEMENT N/A 06/23/2021   MULTIPLE EXTRACTIONS WITH ALVEOLOPLASTY N/A 11/08/2020   Procedure: EXTRACTION OF TEETH NUMBER ONE, FIFTHTEEN, SIXTEEN, SEVENTEEN,  EIGHTTEEN AND THRTY WITH ALVEOLOPLASTY OF LOWER LEFT QUADRANT.;  Surgeon: Sharman Cheek, DMD;  Location: [facility] OR;  Service: Dentistry;  Laterality: N/A;   RIGHT/LEFT HEART CATH AND CORONARY ANGIOGRAPHY N/A 09/11/2020   Procedure: RIGHT/LEFT HEART CATH AND CORONARY ANGIOGRAPHY;  Surgeon: Dolores Patty, MD;  Location: [facility] INVASIVE CV LAB;  Service: Cardiovascular;  Laterality: N/A;   TEE WITHOUT CARDIOVERSION N/A 05/23/2020   Procedure: TRANSESOPHAGEAL ECHOCARDIOGRAM (TEE) WITH PROPOFOL;  Surgeon: [name], MD;  Location: [facility] ENDO SUITE;  Service: Endoscopy;  Laterality: N/A;    Family History  Problem Relation Age of Onset   Multiple sclerosis Mother    Psoriasis Mother    Depression Father    Diabetes Father    Diabetes Paternal Grandmother     Social History:  reports that he quit smoking about 15 months ago. His smoking use included cigarettes. He has never used smokeless tobacco. He reports that he does not drink alcohol and does not use drugs.  Allergies: No Known Allergies  Medications: I have reviewed the patient's current medications.  Results for orders placed or performed during the hospital encounter of 12/27/21 (from the past 48 hour(s))  Aerobic/Anaerobic Culture w Gram Stain (surgical/deep wound)     Status: None (Preliminary result)   Collection Time: 01/13/22  1:33 PM   Specimen: Abscess  Result Value Ref Range   Specimen Description ABSCESS    Special Requests LEFT ARM    Gram Stain NO  WBC SEEN NO ORGANISMS SEEN     Culture      NO GROWTH < 24 HOURS Performed at [facility], [address] [place], [place] [address]    Report Status PENDING   Protime-INR     Status: Abnormal   Collection Time: 01/14/22  4:19 AM  Result Value Ref Range   Prothrombin Time 24.5 (H) 11.4 - 15.2 seconds   INR 2.2 (H) 0.8 - 1.2    Comment: (NOTE) INR goal varies based on device and disease states. Performed at [facility], [address]  [place], [place] [address]   Magnesium     Status: Abnormal   Collection Time: 01/14/22  4:19 AM  Result Value Ref Range   Magnesium 1.5 (L) 1.7 - 2.4 mg/dL    Comment: Performed at [facility], [address], [place], [place] [address]  CBC     Status: Abnormal   Collection Time: 01/14/22  4:19 AM  Result Value Ref Range   WBC 8.8 4.0 - 10.5 K/uL   RBC 3.28 (L) 4.22 - 5.81 MIL/uL   Hemoglobin 8.5 (L) 13.0 - 17.0 g/dL   HCT 81.2 (L) 75.1 - 70.0 %   MCV 82.3 80.0 - 100.0 fL   MCH 25.9 (L) 26.0 - 34.0 pg   MCHC 31.5 30.0 - 36.0 g/dL   RDW 17.4 (H) 94.4 - 96.7 %   Platelets 360 150 - 400 K/uL   nRBC 0.0 0.0 - 0.2 %    Comment: Performed at [facility], [address], [place], [place] [address]  Basic metabolic panel     Status: Abnormal   Collection Time: 01/14/22  4:19 AM  Result Value Ref Range   Sodium 131 (L) 135 - 145 mmol/L   Potassium 3.9 3.5 - 5.1 mmol/L   Chloride 101 98 - 111 mmol/L   CO2 24 22 - 32 mmol/L   Glucose, Bld 101 (H) 70 - 99 mg/dL    Comment: Glucose reference range applies only to samples taken after fasting for at least 8 hours.   BUN 14 6 - 20 mg/dL   Creatinine, Ser 8.46 0.61 - 1.24 mg/dL   Calcium 8.8 (L) 8.9 - 10.3 mg/dL   GFR, Estimated >65 >99 mL/min    Comment: (NOTE) Calculated using the CKD-EPI Creatinine Equation (2021)    Anion gap 6 5 - 15    Comment: Performed at [facility], [address] [place], [place] [address]  SARS Coronavirus 2 by RT PCR (hospital order, performed in [facility] hospital lab) *cepheid single result test* Anterior Nasal Swab     Status: None   Collection Time: 01/14/22 11:23 AM   Specimen: Anterior Nasal Swab  Result Value Ref Range   SARS Coronavirus 2 by RT PCR NEGATIVE NEGATIVE    Comment: (NOTE) SARS-CoV-2 target nucleic acids are NOT DETECTED.  The SARS-CoV-2 RNA is generally detectable in upper and lower respiratory specimens during the acute phase of infection. The  lowest concentration of SARS-CoV-2 viral copies this assay can detect is 250 copies / mL. A negative result does not preclude SARS-CoV-2 infection and should not be used as the sole basis for treatment or other patient management decisions.  A negative result may occur with improper specimen collection / handling, submission of specimen other than nasopharyngeal swab, presence of viral mutation(s) within the areas targeted by this assay, and inadequate number of viral copies (<250 copies /  mL). A negative result must be combined with clinical observations, patient history, and epidemiological information.  Fact Sheet for Patients:   RoadLapTop.co.za  Fact Sheet for Healthcare Providers: http://kim-miller.com/  This test is not yet approved or  cleared by the [place] FDA and has been authorized for detection and/or diagnosis of SARS-CoV-2 by FDA under an Emergency Use Authorization (EUA).  This EUA will remain in effect (meaning this test can be used) for the duration of the COVID-19 declaration under Section 564(b)(1) of the Act, 21 U.S.C. section 360bbb-3(b)(1), unless the authorization is terminated or revoked sooner.  Performed at [facility] Lab, [address] [place], [place] [address]   Heparin level (unfractionated)     Status: Abnormal   Collection Time: 01/14/22 10:18 PM  Result Value Ref Range   Heparin Unfractionated 0.26 (L) 0.30 - 0.70 IU/mL    Comment: (NOTE) The clinical reportable range upper limit is being lowered to >1.10 to align with the FDA approved guidance for the current laboratory assay.  If heparin results are below expected values, and patient dosage has  been confirmed, suggest follow up testing of antithrombin III levels. Performed at [facility], [address] [place], [place] [address]   Protime-INR     Status: Abnormal   Collection Time: 01/15/22  8:38 AM  Result Value Ref Range    Prothrombin Time 25.2 (H) 11.4 - 15.2 seconds   INR 2.3 (H) 0.8 - 1.2    Comment: (NOTE) INR goal varies based on device and disease states. Performed at [facility], [address] [place], [place] [address]   Magnesium     Status: Abnormal   Collection Time: 01/15/22  8:38 AM  Result Value Ref Range   Magnesium 1.5 (L) 1.7 - 2.4 mg/dL    Comment: Performed at [facility], [address], [place], [place] [address]  CBC     Status: Abnormal   Collection Time: 01/15/22  8:38 AM  Result Value Ref Range   WBC 8.5 4.0 - 10.5 K/uL   RBC 3.28 (L) 4.22 - 5.81 MIL/uL   Hemoglobin 8.4 (L) 13.0 - 17.0 g/dL   HCT 29.5 (L) 62.1 - 30.8 %   MCV 81.7 80.0 - 100.0 fL   MCH 25.6 (L) 26.0 - 34.0 pg   MCHC 31.3 30.0 - 36.0 g/dL   RDW 65.7 (H) 84.6 - 96.2 %   Platelets 328 150 - 400 K/uL   nRBC 0.0 0.0 - 0.2 %    Comment: Performed at [facility] Lab, [address] [place], [place] [address]  Basic metabolic panel     Status: Abnormal   Collection Time: 01/15/22  8:38 AM  Result Value Ref Range   Sodium 131 (L) 135 - 145 mmol/L   Potassium 4.0 3.5 - 5.1 mmol/L   Chloride 101 98 - 111 mmol/L   CO2 23 22 - 32 mmol/L   Glucose, Bld 97 70 - 99 mg/dL    Comment: Glucose reference range applies only to samples taken after fasting for at least 8 hours.   BUN 11 6 - 20 mg/dL   Creatinine, Ser 1.32 0.61 - 1.24 mg/dL   Calcium 8.8 (L) 8.9 - 10.3 mg/dL   GFR, Estimated >44 >01 mL/min    Comment: (NOTE) Calculated using the CKD-EPI Creatinine Equation (2021)    Anion gap 7 5 - 15    Comment: Performed at [facility] Lab, [address] [place], [place] [address]  Heparin level (unfractionated)     Status: None   Collection Time: 01/15/22  8:38 AM  Result Value Ref Range   Heparin Unfractionated 0.33 0.30 - 0.70 IU/mL    Comment: (NOTE) The clinical reportable range upper limit is being lowered to >1.10 to align with the FDA approved guidance for the current  laboratory assay.  If heparin results are below expected values, and patient dosage has  been confirmed, suggest follow up testing of antithrombin III levels. Performed at [facility], [address] [place], [place] [address]     MR HUMERUS LEFT W WO CONTRAST  Result Date: 01/15/2022 CLINICAL DATA:  Pyomyositis EXAM: MRI OF THE LEFT HUMERUS WITHOUT AND WITH CONTRAST TECHNIQUE: Multiplanar, multisequence MR imaging of the left humerus was performed before and after the administration of intravenous contrast. CONTRAST:  9mL GADAVIST GADOBUTROL 1 MMOL/ML IV SOLN COMPARISON:  Ultrasound 01/13/2022, CT 01/10/2022 FINDINGS: Bones/Joint/Cartilage There are changes of marrow reconversion. There is no evidence of acute fracture. No aggressive osseous lesion. 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 No aggressive osseous lesion. Muscles and Tendons There is a heterogeneous intramuscular collection primarily in the short head biceps, measuring approximately 7.2 x 5.7 axially and 18.0 cm in craniocaudal extent (series 17 image 34, series 100 image 24). There is peripheral intrinsic T1 hypertensity, thick along the inferior aspect and thin along the superior aspect. There is associated rim enhancement. There are fluid-fluid levels along the superior aspect (series 11 iage 25-26) and thick blooming artifact along the inferior aspect. Soft tissues Extensive soft tissue swelling, intramuscular and superficial along the left upper extremity including the axilla, supra/infraclavicular upper chest, along the pectoralis musculature, deltoid, and distally beyond the elbow into the proximal forearm. There is mild soft tissue enhancement. Marked skin thickening medially. Mildly enlarged left axillar lymph node measuring 1.1 cm, likely reactive. IMPRESSION: Large heterogeneous intramuscular collection in the anterior compartment of the left upper extremity, measuring up to 7.2 x 5.7 axially and 18.0 cm in  craniocaudal extent. Extensive associated soft tissue edema throughout the left upper extremity extending to the axilla and lower neck, and distally beyond the elbow into the forearm. Marked skin thickening medially. Findings are suggestive of cellulitis and pyomyositis. Intrinsic T1 signal and blooming artifact suggests the presence of a hemorrhagic component, and this collection could also simply be a large hematoma. No evidence of osteomyelitis or acute fracture. Electronically Signed   By: Caprice Renshaw M.D.   On: 01/15/2022 08:11   [place] FINE NEEDLE ASP 1ST LESION  Result Date: 01/13/2022 INDICATION: Complex deep collection in the anterior compartment left upper arm. EXAM: ULTRASOUND ASPIRATION LEFT UPPER EXTREMITY MEDICATIONS: Lidocaine 1% subcutaneous ANESTHESIA/SEDATION: Intravenous Fentanyl and Versed  were administered as conscious sedation during continuous monitoring of the patient's level of consciousness and physiological / cardiorespiratory status by the radiology RN, with a total moderate sedation time of 14 minutes. COMPLICATIONS: None immediate. PROCEDURE: Informed written consent was obtained from the patient after a thorough discussion of the procedural risks, benefits and alternatives. All questions were addressed. Maximal Sterile Barrier Technique was utilized including caps, mask, sterile gowns, sterile gloves, sterile drape, hand hygiene and skin antiseptic. A timeout was performed prior to the initiation of the procedure. Survey of the upper arm was performed. Complex deep intramuscular collection was localized, with fluid levels. An appropriate skin entry site was determined and marked. After sedation and lidocaine administration, 7 cm multi sidehole Griffith Citron  sheath needle advanced to the collection. Approximately 100 mL of thin bloody fluid were aspirated, sent for Gram stain and culture. Follow-up scan shows decrease in the size of the overall collection with some residual loculated  fluid. The patient tolerated the procedure well. IMPRESSION: 1. Technically successful ultrasound-guided aspiration of left upper arm intramuscular collection, returning 100 mL thin bloody fluid, sent for Gram stain and culture. Electronically Signed   By: Corlis Leak M.D.   On: 01/13/2022 16:26   [place] LT UPPER EXTREM LTD SOFT TISSUE NON VASCULAR  Result Date: 01/13/2022 INDICATION: Complex deep collection in the anterior compartment left upper arm. EXAM: ULTRASOUND ASPIRATION LEFT UPPER EXTREMITY MEDICATIONS: Lidocaine 1% subcutaneous ANESTHESIA/SEDATION: Intravenous Fentanyl and Versed 2mg  were administered as conscious sedation during continuous monitoring of the patient's level of consciousness and physiological / cardiorespiratory status by the radiology RN, with a total moderate sedation time of 14 minutes. COMPLICATIONS: None immediate. PROCEDURE: Informed written consent was obtained from the patient after a thorough discussion of the procedural risks, benefits and alternatives. All questions were addressed. Maximal Sterile Barrier Technique was utilized including caps, mask, sterile gowns, sterile gloves, sterile drape, hand hygiene and skin antiseptic. A timeout was performed prior to the initiation of the procedure. Survey of the upper arm was performed. Complex deep intramuscular collection was localized, with fluid levels. An appropriate skin entry site was determined and marked. After sedation and lidocaine administration, 7 cm multi sidehole Yueh sheath needle advanced to the collection. Approximately 100 mL of thin bloody fluid were aspirated, sent for Gram stain and culture. Follow-up scan shows decrease in the size of the overall collection with some residual loculated fluid. The patient tolerated the procedure well. IMPRESSION: 1. Technically successful ultrasound-guided aspiration of left upper arm intramuscular collection, returning 100 mL thin bloody fluid, sent for Gram stain and  culture. Electronically Signed   By: Corlis Leak M.D.   On: 01/13/2022 16:26    Review of Systems  Constitutional:  Negative for chills, diaphoresis and fever.  HENT:  Negative for ear discharge, ear pain, hearing loss and tinnitus.   Eyes:  Negative for photophobia and pain.  Respiratory:  Negative for cough and shortness of breath.   Cardiovascular:  Negative for chest pain.  Gastrointestinal:  Negative for abdominal pain, nausea and vomiting.  Genitourinary:  Negative for dysuria, flank pain, frequency and urgency.  Musculoskeletal:  Positive for arthralgias (LUE). Negative for back pain, myalgias and neck pain.  Neurological:  Negative for dizziness and headaches.  Hematological:  Does not bruise/bleed easily.  Psychiatric/Behavioral:  The patient is not nervous/anxious.    Blood pressure (!) 145/89, pulse 80, temperature 97.9 F (36.6 C), temperature source Oral, resp. rate 19, height 5\' 10"  (1.778 m), weight 90.2 kg, SpO2 98 %. Physical Exam Constitutional:      General: He is not in acute distress.    Appearance: He is well-developed. He is not diaphoretic.  HENT:     Head: Normocephalic and atraumatic.  Eyes:     General: No scleral icterus.       Right eye: No discharge.        Left eye: No discharge.     Conjunctiva/sclera: Conjunctivae normal.  Cardiovascular:     Rate and Rhythm: Normal rate and regular rhythm.  Pulmonary:     Effort: Pulmonary effort is normal. No respiratory distress.  Musculoskeletal:     Cervical back: Normal range of motion.     Comments: Left shoulder, elbow, wrist,  digits- no skin wounds, mod TTP upper arm, induration noted medial upper arm, mod-severe edema upper arm, no instability, no blocks to motion except pain  Sens  Ax intact, R/M/U absent  Mot   Ax/ R/ PIN/ M/ AIN/ U absent  Rad 1+  Skin:    General: Skin is warm and dry.  Neurological:     Mental Status: He is alert.  Psychiatric:        Mood and Affect: Mood normal.         Behavior: Behavior normal.     Assessment/Plan: LUE fluid collection -- Challenging case. Given fluid appearance on aspiration 2d ago, negative cultures, contiguity with UE fluid collection, and lack of WBC or fever I doubt this represents abscess and is likely hematoma as adverse effect of his anticoagulation. I spoke with IR who does not feel repeat aspiration this close to first one is advisable. Will place UE ACE for compression and monitor.    Freeman Caldron, PA-C Orthopedic Surgery [phone] 01/15/2022, 9:23 AM

## 2022-01-15 NOTE — Progress Notes (Signed)
Patient left upper arm at bicep is 17 inches in circumference and painful to touch.

## 2022-01-15 NOTE — Progress Notes (Signed)
ANTICOAGULATION CONSULT NOTE  Pharmacy Consult for warfarin Indication:  MVR /AFib  No Known Allergies  Patient Measurements: Height: 5\' 10"  (177.8 cm) Weight: 90.2 kg (198 lb 13.7 oz) IBW/kg (Calculated) : 73 Heparin Dosing Weight: 90.7 kg  Vital Signs: Temp: 97.9 F (36.6 C) (08/02 1128) Temp Source: Oral (08/02 1128) BP: 152/85 (08/02 1128) Pulse Rate: 78 (08/02 1128)  Labs: Recent Labs    01/13/22 0430 01/14/22 0419 01/14/22 2218 01/15/22 0838  HGB 8.8* 8.5*  --  8.4*  HCT 27.1* 27.0*  --  26.8*  PLT 347 360  --  328  LABPROT 26.7* 24.5*  --  25.2*  INR 2.5* 2.2*  --  2.3*  HEPARINUNFRC  --   --  0.26* 0.33  CREATININE 0.72 0.62  --  0.67     Estimated Creatinine Clearance: 148.4 mL/min (by C-G formula based on SCr of 0.67 mg/dL).   Medical History: Past Medical History:  Diagnosis Date   Anemia    Autoimmune disorder (HCC)    pyoderma gangrenosum   CHF (congestive heart failure) (HCC)    Chronic systolic heart failure (HCC)    a. EF 35-40% by echo in 07/2018 b. EF at 45% by repeat echo in 04/2020   DVT (deep venous thrombosis) (HCC)    h/o   Dysrhythmia    Mitral regurgitation    a. 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 b. 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HCC)    arthritis   Shock, septic and cardiogenic 12/28/2021   Tricuspid regurgitation    Assessment: 35 yom with a history of mitral regurgitation s/p mechanical valve replacement on Warfarin, HF, AF and prior DVT, and Hx of hidradenitis and pyoderma gangrenosum .   Patient taking warfarin PTA 4mg  daily per last outpatient [facility] note.  Patient admitted with shock> resolved, endocarditis > remains on abx  New L arm swelling s/p aspiration in IR 7/31, arm MRI with  edema/possible hematoma - no intervention for now per surgery and ortho. warfarin fheld for possible interventions-- will resume  INR 2.3  < goal CBC stable  Heparin drip 1750 uts/hr heparin level 0.31 at goal.  With arm possible hematoma - still stop heparin and only resume if INR falls < 2  Goal of Therapy:  INR goal 2.5-3.5 Monitor platelets by anticoagulation protocol: Yes   Plan:  Resume warfarin 6mg  x1  Stop heparin Daily INR and cbc  Monitor for s/s bleeding    [facility] Pharm.D. CPP, BCPS Clinical Pharmacist [phone] 01/15/2022 2:19 PM   Please check AMION for all [facility] Pharmacy numbers 01/15/2022

## 2022-01-15 NOTE — Progress Notes (Signed)
Occupational Therapy Treatment Patient Details Name: Donald [name] MRN: [id number] DOB: 1985-08-31 Today's Date: 01/15/2022   History of present illness Pt is a 36 y.o. 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 Transfer to ICU 7/15 with cardiogenic and septic shock; ETT 7/15-7/19. 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   OT comments  Patient received in supine and continues to have swelling and pain at LUE. Patient willing to get out of bed and perform grooming at sink. Patient able to get to EOB with increased time and min assist to donn gown due to LUE pain. Patient stood at sink for grooming using LUE as gross assist. Patient stated increased pain and asked to return to supine. Provided positioning of LUE with patient stating increased comfort. Acute OT to continue to follow.    Recommendations for follow up therapy are one component of a multi-disciplinary discharge planning process, led by the attending physician.  Recommendations may be updated based on patient status, additional functional criteria and insurance authorization.    Follow Up Recommendations  No OT follow up    Assistance Recommended at Discharge Intermittent Supervision/Assistance  Patient can return home with the following  A little help with walking and/or transfers;Assistance with cooking/housework;Help with stairs or ramp for entrance;Assist for transportation;Direct supervision/assist for financial management;Direct supervision/assist for medications management;A little help with bathing/dressing/bathroom   Equipment Recommendations  None recommended by OT    Recommendations for Other Services       Precautions / Restrictions Precautions Precautions: Fall Precaution Comments: sacral wounds Restrictions Weight Bearing Restrictions: No       Mobility Bed Mobility Overal bed mobility: Modified Independent             General bed mobility comments: able to get to EOB and back to supine wiht increased time    Transfers Overall transfer level: Needs assistance Equipment used: None Transfers: Sit to/from Stand Sit to Stand: Supervision           General transfer comment: supervision with patient guarding LUE     Balance Overall balance assessment: Needs assistance Sitting-balance support: No upper extremity supported Sitting balance-Leahy Scale: Good     Standing balance support: No upper extremity supported, During functional activity Standing balance-Leahy Scale: Good Standing balance comment: able to stand at sink for grooming                           ADL either performed or assessed with clinical judgement   ADL Overall ADL's : Needs assistance/impaired     Grooming: Wash/dry hands;Wash/dry face;Oral care;Supervision/safety;Standing Grooming Details (indicate cue type and reason): able to use LUE as gross assist         Upper Body Dressing : Minimal assistance;Sitting Upper Body Dressing Details (indicate cue type and reason): min assist due to LUE pain                   General ADL Comments: limited by LUE pain    Extremity/Trunk Assessment              Vision       Perception     Praxis      Cognition Arousal/Alertness: Awake/alert Behavior During Therapy: [facility]  for tasks assessed/performed, Flat affect Overall Cognitive Status: Within Functional Limits for tasks assessed Area of Impairment: Attention, Awareness                   Current Attention Level: Selective Memory: Decreased short-term memory Following Commands: Follows one step commands consistently Safety/Judgement: Decreased awareness of  safety, Decreased awareness of deficits Awareness: Emergent Problem Solving: Slow processing, Requires verbal cues General Comments: minimum vocalization due to pain        Exercises      Shoulder Instructions       General Comments      Pertinent Vitals/ Pain       Pain Assessment Pain Assessment: 0-10 Pain Score: 9  Faces Pain Scale: Hurts whole lot Pain Location: LUE with mobility Pain Descriptors / Indicators: Grimacing, Guarding Pain Intervention(s): Limited activity within patient's tolerance, Monitored during session, Premedicated before session, Repositioned  Home Living                                          Prior Functioning/Environment              Frequency  Min 2X/week        Progress Toward Goals  OT Goals(current goals can now be found in the care plan section)  Progress towards OT goals: Not progressing toward goals - comment (limited due to LUE pain)  Acute Rehab OT Goals Patient Stated Goal: get better OT Goal Formulation: With patient Time For Goal Achievement: 01/17/22 Potential to Achieve Goals: Good ADL Goals Pt Will Perform Grooming: with supervision;standing Pt Will Perform Upper Body Dressing: sitting;with set-up Pt Will Perform Lower Body Dressing: with min guard assist;sit to/from stand Pt Will Transfer to Toilet: with supervision;ambulating;regular height toilet Pt/caregiver will Perform Home Exercise Program: Increased strength;Both right and left upper extremity;With theraband;With Supervision  Plan Discharge plan remains appropriate    Co-evaluation                 AM-PAC OT "6 Clicks" Daily Activity     Outcome Measure   Help from another person eating meals?: None Help from another person taking care of personal grooming?: A Little Help from another person toileting, which includes using toliet, bedpan, or urinal?: A Little Help from another person bathing (including washing, rinsing,  drying)?: A Little Help from another person to put on and taking off regular upper body clothing?: A Little Help from another person to put on and taking off regular lower body clothing?: A Little 6 Click Score: 19    End of Session    OT Visit Diagnosis: Unsteadiness on feet (R26.81);Muscle weakness (generalized) (M62.81);Other symptoms and signs involving cognitive function   Activity Tolerance Patient limited by pain   Patient Left in bed;with call bell/phone within reach;with family/visitor present;with nursing/sitter in room   Nurse Communication Mobility status        Time: 0626-9485 OT Time Calculation (min): 16 min  Charges: OT General Charges $OT Visit: 1 Visit OT Treatments $Self Care/Home Management : 8-22 mins  Alfonse Flavors, OTA Acute Rehabilitation Services  Office [phone]   Dewain Penning 01/15/2022, 2:10 PM

## 2022-01-15 NOTE — Progress Notes (Signed)
Subjective: CC: LUE pain reported as about the same. Increased LUE swelling in the last 24 hours. Less movement of his fingers.  afebrile Objective: Vital signs in last 24 hours: Temp:  [97.9 F (36.6 C)-98.7 F (37.1 C)] 97.9 F (36.6 C) (08/02 0730) Pulse Rate:  [40-83] 80 (08/02 0730) Resp:  [18-22] 19 (08/02 0730) BP: (135-161)/(77-96) 145/89 (08/02 0730) SpO2:  [98 %-100 %] 98 % (08/02 0730) Weight:  [90.2 kg] 90.2 kg (08/02 0336) Last BM Date : 01/10/22  Intake/Output from previous day: 08/01 0701 - 08/02 0700 In: 2199.9 [P.O.:475; I.V.:228.5; IV Piggyback:1496.4] Out: 1575 [Urine:1575] Intake/Output this shift: No intake/output data recorded.  PE: Gen:  Alert, NAD, pleasant Msk: There is induration of left axilla and swelling of the LUE - no appreciable bleeding from aspiration site. Edema of upper and lower arm has increased since yesterday - compartments are soft but feel full, especially in the upper medial arm. No bony tenderness over elbow or wrist. Fingers warm.   Lab Results:  Recent Labs    01/13/22 0430 01/14/22 0419  WBC 8.9 8.8  HGB 8.8* 8.5*  HCT 27.1* 27.0*  PLT 347 360   BMET Recent Labs    01/13/22 0430 01/14/22 0419  NA 131* 131*  K 3.8 3.9  CL 101 101  CO2 23 24  GLUCOSE 90 101*  BUN 15 14  CREATININE 0.72 0.62  CALCIUM 8.9 8.8*   PT/INR Recent Labs    01/13/22 0430 01/14/22 0419  LABPROT 26.7* 24.5*  INR 2.5* 2.2*   CMP     Component Value Date/Time   NA 131 (L) 01/14/2022 0419   K 3.9 01/14/2022 0419   CL 101 01/14/2022 0419   CO2 24 01/14/2022 0419   GLUCOSE 101 (H) 01/14/2022 0419   BUN 14 01/14/2022 0419   CREATININE 0.62 01/14/2022 0419   CALCIUM 8.8 (L) 01/14/2022 0419   PROT 8.7 (H) 01/08/2022 0525   ALBUMIN 2.5 (L) 01/08/2022 0525   AST 240 (H) 01/08/2022 0525   ALT 294 (H) 01/08/2022 0525   ALKPHOS 129 (H) 01/08/2022 0525   BILITOT 1.0 01/08/2022 0525   GFRNONAA >60 01/14/2022 0419   GFRAA >60  01/09/2015 1630   Lipase     Component Value Date/Time   LIPASE 31 07/22/2021 0248    Studies/Results: MR HUMERUS LEFT W WO CONTRAST  Result Date: 01/15/2022 CLINICAL DATA:  Pyomyositis EXAM: MRI OF THE LEFT HUMERUS WITHOUT AND WITH CONTRAST TECHNIQUE: Multiplanar, multisequence MR imaging of the left humerus was performed before and after the administration of intravenous contrast. CONTRAST:  66mL GADAVIST GADOBUTROL 1 MMOL/ML IV SOLN COMPARISON:  Ultrasound 01/13/2022, CT 01/10/2022 FINDINGS: Bones/Joint/Cartilage There are changes of marrow reconversion. There is no evidence of acute fracture. No aggressive osseous lesion. 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 No aggressive osseous lesion. Muscles and Tendons There is a heterogeneous intramuscular collection primarily in the short head biceps, measuring approximately 7.2 x 5.7 axially and 18.0 cm in craniocaudal extent (series 17 image 34, series 100 image 24). There is peripheral intrinsic T1 hypertensity, thick along the inferior aspect and thin along the superior aspect. There is associated rim enhancement. There are fluid-fluid levels along the superior aspect (series 11 iage 25-26) and thick blooming artifact along the inferior aspect. Soft tissues Extensive soft tissue swelling, intramuscular and superficial along the left upper extremity including the axilla, supra/infraclavicular upper chest, along the pectoralis musculature, deltoid, and distally beyond the elbow into  the proximal forearm. There is mild soft tissue enhancement. Marked skin thickening medially. Mildly enlarged left axillar lymph node measuring 1.1 cm, likely reactive. IMPRESSION: Large heterogeneous intramuscular collection in the anterior compartment of the left upper extremity, measuring up to 7.2 x 5.7 axially and 18.0 cm in craniocaudal extent. Extensive associated soft tissue edema throughout the left upper extremity extending to the axilla and lower neck, and  distally beyond the elbow into the forearm. Marked skin thickening medially. Findings are suggestive of cellulitis and pyomyositis. Intrinsic T1 signal and blooming artifact suggests the presence of a hemorrhagic component, and this collection could also simply be a large hematoma. No evidence of osteomyelitis or acute fracture. Electronically Signed   By: Maurine Simmering M.D.   On: 01/15/2022 08:11   [place] FINE NEEDLE ASP 1ST LESION  Result Date: 01/13/2022 INDICATION: Complex deep collection in the anterior compartment left upper arm. EXAM: ULTRASOUND ASPIRATION LEFT UPPER EXTREMITY MEDICATIONS: Lidocaine 1% subcutaneous ANESTHESIA/SEDATION: Intravenous Fentanyl 117mcg and Versed 2mg  were administered as conscious sedation during continuous monitoring of the patient's level of consciousness and physiological / cardiorespiratory status by the radiology RN, with a total moderate sedation time of 14 minutes. COMPLICATIONS: None immediate. PROCEDURE: Informed written consent was obtained from the patient after a thorough discussion of the procedural risks, benefits and alternatives. All questions were addressed. Maximal Sterile Barrier Technique was utilized including caps, mask, sterile gowns, sterile gloves, sterile drape, hand hygiene and skin antiseptic. A timeout was performed prior to the initiation of the procedure. Survey of the upper arm was performed. Complex deep intramuscular collection was localized, with fluid levels. An appropriate skin entry site was determined and marked. After sedation and lidocaine administration, 7 cm multi sidehole Yueh sheath needle advanced to the collection. Approximately 100 mL of thin bloody fluid were aspirated, sent for Gram stain and culture. Follow-up scan shows decrease in the size of the overall collection with some residual loculated fluid. The patient tolerated the procedure well. IMPRESSION: 1. Technically successful ultrasound-guided aspiration of left upper arm  intramuscular collection, returning 100 mL thin bloody fluid, sent for Gram stain and culture. Electronically Signed   By: Lucrezia Europe M.D.   On: 01/13/2022 16:26   [place] LT UPPER EXTREM LTD SOFT TISSUE NON VASCULAR  Result Date: 01/13/2022 INDICATION: Complex deep collection in the anterior compartment left upper arm. EXAM: ULTRASOUND ASPIRATION LEFT UPPER EXTREMITY MEDICATIONS: Lidocaine 1% subcutaneous ANESTHESIA/SEDATION: Intravenous Fentanyl 139mcg and Versed 2mg  were administered as conscious sedation during continuous monitoring of the patient's level of consciousness and physiological / cardiorespiratory status by the radiology RN, with a total moderate sedation time of 14 minutes. COMPLICATIONS: None immediate. PROCEDURE: Informed written consent was obtained from the patient after a thorough discussion of the procedural risks, benefits and alternatives. All questions were addressed. Maximal Sterile Barrier Technique was utilized including caps, mask, sterile gowns, sterile gloves, sterile drape, hand hygiene and skin antiseptic. A timeout was performed prior to the initiation of the procedure. Survey of the upper arm was performed. Complex deep intramuscular collection was localized, with fluid levels. An appropriate skin entry site was determined and marked. After sedation and lidocaine administration, 7 cm multi sidehole Yueh sheath needle advanced to the collection. Approximately 100 mL of thin bloody fluid were aspirated, sent for Gram stain and culture. Follow-up scan shows decrease in the size of the overall collection with some residual loculated fluid. The patient tolerated the procedure well. IMPRESSION: 1. Technically successful ultrasound-guided aspiration of left upper arm  intramuscular collection, returning 100 mL thin bloody fluid, sent for Gram stain and culture. Electronically Signed   By: Corlis Leak M.D.   On: 01/13/2022 16:26    Anti-infectives: Anti-infectives (From admission, onward)     Start     Dose/Rate Route Frequency Ordered Stop   01/08/22 0000  ceFEPime (MAXIPIME) IVPB        2 g Intravenous Every 8 hours 01/08/22 1224 02/08/22 2359   01/08/22 0000  vancomycin IVPB        750 mg Intravenous Every 12 hours 01/08/22 1224 02/07/22 2359   01/07/22 2328  vancomycin (VANCOREADY) IVPB 750 mg/150 mL        750 mg 150 mL/hr over 60 Minutes Intravenous Every 12 hours 01/07/22 1140     12/30/21 1400  ceFEPIme (MAXIPIME) 2 g in sodium chloride 0.9 % 100 mL IVPB        2 g 200 mL/hr over 30 Minutes Intravenous Every 8 hours 12/30/21 0710 02/06/22 2359   12/30/21 1000  vancomycin (VANCOREADY) IVPB 750 mg/150 mL  Status:  Discontinued        750 mg 150 mL/hr over 60 Minutes Intravenous Every 8 hours 12/30/21 0713 01/07/22 1140   12/29/21 1015  vancomycin (VANCOREADY) IVPB 1250 mg/250 mL  Status:  Discontinued        1,250 mg 166.7 mL/hr over 90 Minutes Intravenous Every 24 hours 12/29/21 0915 12/30/21 0713   12/28/21 1700  ceFEPIme (MAXIPIME) 2 g in sodium chloride 0.9 % 100 mL IVPB  Status:  Discontinued        2 g 200 mL/hr over 30 Minutes Intravenous Every 12 hours 12/28/21 1227 12/30/21 0710   12/28/21 1227  vancomycin variable dose per unstable renal function (pharmacist dosing)  Status:  Discontinued         Does not apply See admin instructions 12/28/21 1227 12/29/21 1233   12/28/21 0600  vancomycin (VANCOREADY) IVPB 1250 mg/250 mL  Status:  Discontinued        1,250 mg 166.7 mL/hr over 90 Minutes Intravenous Every 12 hours 12/27/21 1812 12/28/21 1235   12/27/21 1815  ceFEPIme (MAXIPIME) 2 g in sodium chloride 0.9 % 100 mL IVPB  Status:  Discontinued        2 g 200 mL/hr over 30 Minutes Intravenous Every 8 hours 12/27/21 1812 12/28/21 1227   12/27/21 1645  ceFEPIme (MAXIPIME) 2 g in sodium chloride 0.9 % 100 mL IVPB  Status:  Discontinued        2 g 200 mL/hr over 30 Minutes Intravenous  Once 12/27/21 1634 12/27/21 1644   12/27/21 1645  metroNIDAZOLE (FLAGYL) IVPB  500 mg        500 mg 100 mL/hr over 60 Minutes Intravenous  Once 12/27/21 1634 12/27/21 1944   12/27/21 1645  vancomycin (VANCOCIN) IVPB 1000 mg/200 mL premix  Status:  Discontinued        1,000 mg 200 mL/hr over 60 Minutes Intravenous  Once 12/27/21 1634 12/27/21 1643   12/27/21 1645  vancomycin (VANCOREADY) IVPB 1750 mg/350 mL        1,750 mg 175 mL/hr over 120 Minutes Intravenous  Once 12/27/21 1643 12/27/21 2150   12/27/21 1645  ceFEPIme (MAXIPIME) 2 g in sodium chloride 0.9 % 100 mL IVPB  Status:  Discontinued        2 g 200 mL/hr over 30 Minutes Intravenous Every 8 hours 12/27/21 1644 12/27/21 1812        Assessment/Plan Left upper  extremity hematoma vs abscess - afebrile, normal WBC, some warmth of LUE 36 year old male with multiple medical co-morbidities including, but not limited to, acute right MCA stroke, endocarditis of prosthetic mitral valve, and heart failure with reduced ejection fraction on oral anticoagulation who has a left axillary/upper arm abscess with cellulitis on exam and pyomyositis on CT.  Given his acute on chronic heart failure and need for anticoagulation We think placing this patient under a general anesthetic for incision and drainage of his axilla would be high risk, cardiology team confirms this risk.  -  Attempted bedside aspiration on 7/28 without success -  Increasing LUE swelling >> IR aspiration of 100 cc thin bloody fluid 7/31, follow Cx  -  Exam slightly improved intially but worsened in last 24 hours. MRI today shows large intramuscular fluid collection - see radiolist read. I have called orthopedic surgery for evaluation. Defer any surgical recommendations to their team. Continue heparin gtt for now.  -  Continue broad-spectrum antibiotics for now.   LOS: 18 days    Adam Phenix , [facility] 01/15/2022, 8:50 AM Please see Amion for pager number during day hours 7:00am-4:30pm

## 2022-01-15 NOTE — Progress Notes (Addendum)
Advanced Heart Failure Rounding Note   Subjective:   7/19 Extubated. Diuresed with IV lasix. I/O not accurate. Norepi weaned off.  7/20 Swan out  7/23 Milrinone off 7/27 Acute LUE weakness/swelling. CODE Stroke. MRI w/ several small acute right MCA territory ischemic strokes, suggestive of a cardioembolic etiology. UE CT with contrast concerning for cellulitis and pyomyositis of left axilla 7/31 s/p  IR aspiration of 100 cc thin bloody fluid from left axilla. Cx NGTD. Midodrine discontinued w/ elevated BPs. Low dose spiro restarted   C/w LUE pain and swelling. Minimal improvement post aspiration. Ortho saw today, think less likely abscess and mostly hematoma based on MRI today (see below). Per IR, no plans for repeat aspiration. Treating w/ ACE wrap for compression.    INR 2.3 today   Remains on On vanc/cefepime for pMVR endocarditis. Has PICC for continuation of home IV abx.   Denies dyspnea. Volume status ok.    LUE MRI 8/2 IMPRESSION: Large heterogeneous intramuscular collection in the anterior compartment of the left upper extremity, measuring up to 7.2 x 5.7 axially and 18.0 cm in craniocaudal extent. Extensive associated soft tissue edema throughout the left upper extremity extending to the axilla and lower neck, and distally beyond the elbow into the forearm. Marked skin thickening medially. Findings are suggestive of cellulitis and pyomyositis. Intrinsic T1 signal and blooming artifact suggests the presence of a hemorrhagic component, and this collection could also simply be a large hematoma.   No evidence of osteomyelitis or acute fracture.   Objective:     Vital Signs:   Temp:  [97.9 F (36.6 C)-98.7 F (37.1 C)] 97.9 F (36.6 C) (08/02 0730) Pulse Rate:  [40-83] 80 (08/02 0730) Resp:  [18-22] 19 (08/02 0730) BP: (135-161)/(77-96) 145/89 (08/02 0730) SpO2:  [98 %-100 %] 98 % (08/02 0730) Weight:  [90.2 kg] 90.2 kg (08/02 0336) Last BM Date :  01/10/22  Weight change: Filed Weights   01/13/22 0349 01/14/22 0412 01/15/22 0336  Weight: 90.1 kg 90.9 kg 90.2 kg    Intake/Output:   Intake/Output Summary (Last 24 hours) at 01/15/2022 1003 Last data filed at 01/15/2022 0550 Gross per 24 hour  Intake 2199.92 ml  Output 1575 ml  Net 624.92 ml   PHYSICAL EXAM: General:  depressed looking young male. No respiratory difficulty HEENT: normal Neck: supple. JVD 7-8 cm. Carotids 2+ bilat; no bruits. No lymphadenopathy or thyromegaly appreciated. Cor: PMI nondisplaced. Regular rate & rhythm. + crips mechanical valve sounds  Lungs: CTAB  Abdomen: soft, nontender, nondistended. No hepatosplenomegaly. No bruits or masses. Good bowel sounds. Extremities: no cyanosis, clubbing, rash, no LE edema. LUE swollen warm to touch  Neuro: alert & oriented x 3 Affect pleasant. + LUE weakness, unable to lift lt arm    Telemetry: SR 80s (personally reviewed)   Labs: Basic Metabolic Panel: Recent Labs  Lab 01/11/22 0452 01/12/22 0125 01/13/22 0430 01/14/22 0419 01/15/22 0838  NA 128* 128* 131* 131* 131*  K 4.0 4.0 3.8 3.9 4.0  CL 99 100 101 101 101  CO2 21* 23 23 24 23   GLUCOSE 107* 129* 90 101* 97  BUN 11 15 15 14 11   CREATININE 0.78 0.82 0.72 0.62 0.67  CALCIUM 9.0 8.8* 8.9 8.8* 8.8*  MG 1.5* 1.6* 1.3* 1.5* 1.5*    Liver Function Tests: No results for input(s): "AST", "ALT", "ALKPHOS", "BILITOT", "PROT", "ALBUMIN" in the last 168 hours.  No results for input(s): "LIPASE", "AMYLASE" in the last 168 hours. No results  for input(s): "AMMONIA" in the last 168 hours.   CBC: Recent Labs  Lab 01/11/22 0452 01/12/22 0125 01/13/22 0430 01/14/22 0419 01/15/22 0838  WBC 7.7 8.3 8.9 8.8 8.5  HGB 9.8* 9.3* 8.8* 8.5* 8.4*  HCT 30.7* 29.2* 27.1* 27.0* 26.8*  MCV 82.1 81.6 81.1 82.3 81.7  PLT 365 383 347 360 328    Cardiac Enzymes: Recent Labs  Lab 01/10/22 0505  CKTOTAL 180    BNP: BNP (last 3 results) Recent Labs     07/24/21 1433 10/11/21 1230 12/27/21 1451  BNP 3,184.1* 400.8* >4,500.0*    ProBNP (last 3 results) No results for input(s): "PROBNP" in the last 8760 hours.    Other results:  Imaging: MR HUMERUS LEFT W WO CONTRAST  Result Date: 01/15/2022 CLINICAL DATA:  Pyomyositis EXAM: MRI OF THE LEFT HUMERUS WITHOUT AND WITH CONTRAST TECHNIQUE: Multiplanar, multisequence MR imaging of the left humerus was performed before and after the administration of intravenous contrast. CONTRAST:  78mL GADAVIST GADOBUTROL 1 MMOL/ML IV SOLN COMPARISON:  Ultrasound 01/13/2022, CT 01/10/2022 FINDINGS: Bones/Joint/Cartilage There are changes of marrow reconversion. There is no evidence of acute fracture. No aggressive osseous lesion. 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 No aggressive osseous lesion. Muscles and Tendons There is a heterogeneous intramuscular collection primarily in the short head biceps, measuring approximately 7.2 x 5.7 axially and 18.0 cm in craniocaudal extent (series 17 image 34, series 100 image 24). There is peripheral intrinsic T1 hypertensity, thick along the inferior aspect and thin along the superior aspect. There is associated rim enhancement. There are fluid-fluid levels along the superior aspect (series 11 iage 25-26) and thick blooming artifact along the inferior aspect. Soft tissues Extensive soft tissue swelling, intramuscular and superficial along the left upper extremity including the axilla, supra/infraclavicular upper chest, along the pectoralis musculature, deltoid, and distally beyond the elbow into the proximal forearm. There is mild soft tissue enhancement. Marked skin thickening medially. Mildly enlarged left axillar lymph node measuring 1.1 cm, likely reactive. IMPRESSION: Large heterogeneous intramuscular collection in the anterior compartment of the left upper extremity, measuring up to 7.2 x 5.7 axially and 18.0 cm in craniocaudal extent. Extensive associated soft tissue  edema throughout the left upper extremity extending to the axilla and lower neck, and distally beyond the elbow into the forearm. Marked skin thickening medially. Findings are suggestive of cellulitis and pyomyositis. Intrinsic T1 signal and blooming artifact suggests the presence of a hemorrhagic component, and this collection could also simply be a large hematoma. No evidence of osteomyelitis or acute fracture. Electronically Signed   By: Maurine Simmering M.D.   On: 01/15/2022 08:11   [place] FINE NEEDLE ASP 1ST LESION  Result Date: 01/13/2022 INDICATION: Complex deep collection in the anterior compartment left upper arm. EXAM: ULTRASOUND ASPIRATION LEFT UPPER EXTREMITY MEDICATIONS: Lidocaine 1% subcutaneous ANESTHESIA/SEDATION: Intravenous Fentanyl 139mcg and Versed 2mg  were administered as conscious sedation during continuous monitoring of the patient's level of consciousness and physiological / cardiorespiratory status by the radiology RN, with a total moderate sedation time of 14 minutes. COMPLICATIONS: None immediate. PROCEDURE: Informed written consent was obtained from the patient after a thorough discussion of the procedural risks, benefits and alternatives. All questions were addressed. Maximal Sterile Barrier Technique was utilized including caps, mask, sterile gowns, sterile gloves, sterile drape, hand hygiene and skin antiseptic. A timeout was performed prior to the initiation of the procedure. Survey of the upper arm was performed. Complex deep intramuscular collection was localized, with fluid levels. An appropriate skin  entry site was determined and marked. After sedation and lidocaine administration, 7 cm multi sidehole Yueh sheath needle advanced to the collection. Approximately 100 mL of thin bloody fluid were aspirated, sent for Gram stain and culture. Follow-up scan shows decrease in the size of the overall collection with some residual loculated fluid. The patient tolerated the procedure well.  IMPRESSION: 1. Technically successful ultrasound-guided aspiration of left upper arm intramuscular collection, returning 100 mL thin bloody fluid, sent for Gram stain and culture. Electronically Signed   By: Corlis Leak M.D.   On: 01/13/2022 16:26   [place] LT UPPER EXTREM LTD SOFT TISSUE NON VASCULAR  Result Date: 01/13/2022 INDICATION: Complex deep collection in the anterior compartment left upper arm. EXAM: ULTRASOUND ASPIRATION LEFT UPPER EXTREMITY MEDICATIONS: Lidocaine 1% subcutaneous ANESTHESIA/SEDATION: Intravenous Fentanyl and Versed 2mg  were administered as conscious sedation during continuous monitoring of the patient's level of consciousness and physiological / cardiorespiratory status by the radiology RN, with a total moderate sedation time of 14 minutes. COMPLICATIONS: None immediate. PROCEDURE: Informed written consent was obtained from the patient after a thorough discussion of the procedural risks, benefits and alternatives. All questions were addressed. Maximal Sterile Barrier Technique was utilized including caps, mask, sterile gowns, sterile gloves, sterile drape, hand hygiene and skin antiseptic. A timeout was performed prior to the initiation of the procedure. Survey of the upper arm was performed. Complex deep intramuscular collection was localized, with fluid levels. An appropriate skin entry site was determined and marked. After sedation and lidocaine administration, 7 cm multi sidehole Yueh sheath needle advanced to the collection. Approximately 100 mL of thin bloody fluid were aspirated, sent for Gram stain and culture. Follow-up scan shows decrease in the size of the overall collection with some residual loculated fluid. The patient tolerated the procedure well. IMPRESSION: 1. Technically successful ultrasound-guided aspiration of left upper arm intramuscular collection, returning 100 mL thin bloody fluid, sent for Gram stain and culture. Electronically Signed   By: M.D.    On: 01/13/2022 16:26     Medications:     Scheduled Medications:  amiodarone  200 mg Oral Daily   aspirin  81 mg Oral Daily   Chlorhexidine Gluconate Cloth  6 each Topical Daily   dapagliflozin propanediol  10 mg Oral Daily   digoxin  0.125 mg Oral Daily   feeding supplement  237 mL Oral TID BM   ferrous sulfate  325 mg Oral QODAY   furosemide  40 mg Oral Daily   polyethylene glycol  17 g Oral BID   rosuvastatin  40 mg Oral Daily   senna  1 tablet Oral BID   sodium chloride flush  10-40 mL Intracatheter Q12H   spironolactone  25 mg Oral Daily    Infusions:  sodium chloride 10 mL/hr at 01/15/22 0550   sodium chloride 10 mL/hr at 01/07/22 2200   ceFEPime (MAXIPIME) IV Stopped (01/15/22 0544)   heparin 1,750 Units/hr (01/15/22 0609)   magnesium sulfate bolus IVPB     vancomycin Stopped (01/15/22 0028)    PRN Medications: sodium chloride, sodium chloride, ipratropium-albuterol, mouth rinse, oxyCODONE, sodium chloride flush   Assessment/Plan:   1. Mixed cardiogenic/septic shock - echo 12/28/21 LVEF 10% RV sev HK - TEE 7/15 severe biventricular failure. + vegetation on mechanical MVR - Bcx - NGTD - Respiratory culture w/ moderate WBCs (mononuclear and PMN) and no organisms  - Cultures no growth.  - ID following. Continue vanc/cefepime. Now AF - Off inotropes/pressors.   2.  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 Repeat echo as above.  - Echo this admit LVEF 10% RV severely HK.  - Off milrinone and NE. - Euvolemic on exam. No dyspnea  - Continue spiro 25 mg daily  - Continue Farxiga 10 mg daily - Continue digoxin 0.125, dig level 0.5. - If BP remains stable off midodrine, can add back home losartan soon  - Long-term only option is transplant. Dr. Haroldine Laws discussed with [facility] to see if he would be a candidate in setting of pyoderma  gangrenosum. They will discuss.    3. 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  - Underwent MVR with mechanical mitral valve in 05/2021 at [facility].  - TEE 7/15 severe biventricular failure. + vegetation on mechanical MVR - Bcx NGTD. -> on vanc/cefepime. 6 weeks IV abx, end date 08/24.  - Has PICC for home abx  - ID following. Appreciate assistance. - INR 2.3  (goal 2.5-3.5). Coumadin per pharmacy     4. Acute hypoxic respiratory failure - intubated 7/15 in setting of shock - Extubated 719  - O2 stable on RA. Resolved.  5. Paroxysmal Atrial Fibrillation - In SR  - does not tolerate AF - liver enzymes continue to improve. - back on amio 200 daily  5. AKI - Due to ATN/shock - Baseline creatinine 0.98 - 1.0.  - Peak 2.6. Resolved.   6. Shock liver - continue supportive care - AST /ALT continue trend down.   7. Autoimmune Disorder  - He has seronegative spondylitis and pyoderma gangrenosum. Had been on Humira. - Followed by Rheumatology as an outpatient.   8. Hypomagnesemia/hypokalemia - Mag 1.5. Give Mg Supp. - K 4.0. Continue spiro 25 mg   9. Deconditioning - PT/OT following. Will need outpatient PT/OT post stroke. - Continue to mobilize.   10. 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 LUE cellulitis and pyomyositis of left axilla  - per GS, too high risk for I&D under general anesthesia - s/p IR aspiration of 100 cc thin bloody fluid from left axilla 7/31. Cx NGTD. - minimal improvement post aspiration. C/w pain and swelling.  - LUE MRI 8/2 findings are suggestive of cellulitis and pyomyositis. Intrinsic T1 signal and blooming artifact suggests the presence of a hemorrhagic component,  and this collection could also simply be a large hematoma. Seen by Ortho, also agree may be hematoma. Per IR no plans to repeat aspiration attempt   - try treatment w/ ACE wrap for compression   Length of Stay: [address], PA-C  10:03 AM   01/15/2022, 10:03 AM  Advanced Heart Failure Team Pager [phone] (M-F; 7a - 4p)  Please contact [facility] for night-coverage after hours (4p -7a ) and weekends on amion.  Patient seen with PA, agree with the above note.   LUE remains swollen, he thinks it may be a little larger today.  MRI LUE yesterday concerning for large hematoma.  Seen by orthopedics.    No dyspnea, otherwise no complaints.    General: NAD Neck: No JVD, no thyromegaly or thyroid nodule.  Lungs: Clear  to auscultation bilaterally with normal respiratory effort. CV: Nondisplaced PMI.  Heart regular S1/S2 with mechanical S1, 1/6 SEM RUSB.  No peripheral edema.   Abdomen: Soft, nontender, no hepatosplenomegaly, no distention.  Skin: Intact without lesions or rashes.  Neurologic: Alert and oriented x 3.  Psych: Normal affect. Extremities: No clubbing or cyanosis.  HEENT: Normal.   Continue current cardiac meds and add Entresto.   Continue vancomycin/cefepime for endocarditis and ?component of abscess LUE.   Orthopedics following LUE, treating as hematoma with plan to wrap/compress the arm for now.  No surgical intervention for time being.  With mechanical mitral valve, will continue warfarin but will stop heparin gtt unless INR drops below 2.    Loralie Champagne 01/15/2022 11:07 AM

## 2022-01-15 NOTE — Progress Notes (Addendum)
Daily Progress Note Intern Pager: [phone]  Patient name: Donald [name] Medical record number: [id number] Date of birth: 1985-11-13 Age: 36 y.o. Gender: male  Primary Care Provider: No primary care provider on file. Consultants: pharmacy, heart failure, surgery, orthopedic surgery Code Status: FULL  Pt Overview and Major Events to Date:  -714 admitted FPTS for sepsis, N/V. Started on flagyl, cefepime, vanc -7/15 worsening shock> ICU> intubated, pressors, abx. Flagyl stopped -7/16 decreasing milrinone for high CO and co-ox -7/19 extubated -7/21 drop in cardiac index, Levophed restarted, Swan discontinued -7/22 levophed stopped -7/23 milrinone stopped -7/24 out to FMTS -7/27 acute stroke  and swelling of L upper arm, CT w/ pyomyositis -7/31 aspiration of L upper arm completed (GS negative, cultures pending) -8/2 MRI L humerus, cellulitis and pyomyositis + hemorrhagic component vs large hematoma, orthopedic surgery following  Assessment and Plan: Donald [name] is a 36 y.o. with PMHx mitral regurgitation s/p mitral valve mechanical valve, biventricular heart failure, and paroxysmal atrial fibrillation who presented with n/v/d and found to have prosthetic MV endocarditis, stay complicated by pyomyositis on humerus, now on hospital day 18   * Endocarditis of prosthetic mitral valve (HCC) Stable, afebrile. INR 2.2 (8/1) - Continue vancomycin and cefepime through PICC.  Patient is set up to go home with home antibiotic infusions. End date 8/27. - per pharmacy, hold warfarin, continue heparin  Left arm pain and swelling CT with contrast concerning for cellulitis and pyomyositis of left axilla extending to coracobrachialis.  Surgery consulted, attempted bedside aspiration without success, decline further surgical management at this time given high risk on anticoagulation. IR following, aspiration completed, GS negative, no organisms, cultures pending -F/u IR recs,  INR -Continue broad spectrum abx as above, discussed regimen with ID 7/30 -increased diameter from 16 in to 17 in s/p aspiration, decreased ROM, pulse, and sensation - MRI humerus with IV contrast: cellulitis and pyomyositis + hemorrhagic component vs large hematoma - orthopedic surgery following  Acute embolic stroke (HCC) Stable.  CTA head/neck negative for aneurysm, so stroke team recommended to continue current antithrombotics. Crestor 40 mg daily started 7/28.   -Stroke team signed off, plan to follow-up with stroke clinic NP in [facility] in about 4 weeks.  Paroxysmal atrial fibrillation (HCC) Stable, rate controlled. -Continue amiodarone 200 mg daily -Cardiology following, appreciate conditions.   Chronic heart failure (HCC) Stable at this time.  Lasix held per heart failure team.  - continue spironolactone 25 mg - Continue digoxin 0.125 mg and Farxiga 10 mg - Appreciate heart failure team care and recommendations   Iron deficiency anemia Stable, chronic.  Hemoglobin near baseline.  Continue to monitor. - start iron supplementation (ferrous sulfate 325 mg, every other day)  Prolonged QT interval Last QTc 495 on 7/29  Hypomagnesemia Mg 1.5 (8/2) -4g IV Mg given -AM Mg  AKI (acute kidney injury) (HCC)-resolved as of 01/07/2022 Resolved with improving shock. -Continue fluids, PO as tolerated -AM BMP  Shock, septic and cardiogenic-resolved as of 01/11/2022 Resolved since out of ICU. On vanc, cefepime, midodrine. -Monitor VS -Continue abx per endocarditis  Gastroenteritis-resolved as of 01/11/2022     FEN/GI: Senna BID, PEG BID PPx: heparin Dispo:Home pending clinical improvement . Barriers include ongoing medical issues.   Subjective:  Patient slept well last night. Endorses somewhat adequate control of pain. Continues to be concerned about LUE and increasing diameter, loss of function and sensation. Endorses constipation for 3 days  now.  Objective: Temp:  [97.9 F (36.6  C)-98.7 F (37.1 C)] 97.9 F (36.6 C) (08/02 0730) Pulse Rate:  [40-83] 80 (08/02 0730) Resp:  [18-22] 19 (08/02 0730) BP: (135-161)/(77-96) 145/89 (08/02 0730) SpO2:  [98 %-100 %] 98 % (08/02 0730) Weight:  [198 lb 13.7 oz (90.2 kg)] 198 lb 13.7 oz (90.2 kg) (08/02 0336) Physical Exam: General: Alert, oriented, in NAD HEENT: normocephalic, atraumatic Skin: warm, dry Cardiovascular: Regular rate Respiratory: CTAB, normal rate on RA Abdomen: nondistended Extremities: Unable to spontaneously move LUE, swollen around bicep. LUE warm, +1 pulses, mild tenderness to palpation on lower arm, but tender on passive ROM  Laboratory: Most recent CBC Lab Results  Component Value Date   WBC 8.5 01/15/2022   HGB 8.4 (L) 01/15/2022   HCT 26.8 (L) 01/15/2022   MCV 81.7 01/15/2022   PLT 328 01/15/2022   Most recent BMP    Latest Ref Rng & Units 01/15/2022    8:38 AM  BMP  Glucose 70 - 99 mg/dL 97   BUN 6 - 20 mg/dL 11   Creatinine 1.51 - 1.24 mg/dL 7.61   Sodium 607 - 371 mmol/L 131   Potassium 3.5 - 5.1 mmol/L 4.0   Chloride 98 - 111 mmol/L 101   CO2 22 - 32 mmol/L 23   Calcium 8.9 - 10.3 mg/dL 8.8     Other pertinent labs: INR 2.3 Mg 1.5  Imaging/Diagnostic Tests: Radiologist Impression:   IMPRESSION: Large heterogeneous intramuscular collection in the anterior compartment of the left upper extremity, measuring up to 7.2 x 5.7 axially and 18.0 cm in craniocaudal extent. Extensive associated soft tissue edema throughout the left upper extremity extending to the axilla and lower neck, and distally beyond the elbow into the forearm. Marked skin thickening medially. Findings are suggestive of cellulitis and pyomyositis. Intrinsic T1 signal and blooming artifact suggests the presence of a hemorrhagic component, and this collection could also simply be a large hematoma.   No evidence of osteomyelitis or acute fracture.  Donald Gamble,  MD 01/15/2022, 9:56 AM  PGY-1, [facility] FPTS Intern pager: [phone], text pages welcome Secure chat group [facility] Teaching Service

## 2022-01-16 ENCOUNTER — Telehealth (HOSPITAL_COMMUNITY): Payer: Self-pay | Admitting: Pharmacy Technician

## 2022-01-16 ENCOUNTER — Other Ambulatory Visit (HOSPITAL_COMMUNITY): Payer: Self-pay

## 2022-01-16 DIAGNOSIS — L88 Pyoderma gangrenosum: Secondary | ICD-10-CM | POA: Diagnosis not present

## 2022-01-16 DIAGNOSIS — I5043 Acute on chronic combined systolic (congestive) and diastolic (congestive) heart failure: Secondary | ICD-10-CM | POA: Diagnosis not present

## 2022-01-16 DIAGNOSIS — D509 Iron deficiency anemia, unspecified: Secondary | ICD-10-CM | POA: Diagnosis not present

## 2022-01-16 DIAGNOSIS — T826XXA Infection and inflammatory reaction due to cardiac valve prosthesis, initial encounter: Secondary | ICD-10-CM | POA: Diagnosis not present

## 2022-01-16 DIAGNOSIS — I38 Endocarditis, valve unspecified: Secondary | ICD-10-CM | POA: Diagnosis not present

## 2022-01-16 LAB — COMPREHENSIVE METABOLIC PANEL
ALT: 60 U/L — ABNORMAL HIGH (ref 0–44)
AST: 92 U/L — ABNORMAL HIGH (ref 15–41)
Albumin: 2.1 g/dL — ABNORMAL LOW (ref 3.5–5.0)
Alkaline Phosphatase: 119 U/L (ref 38–126)
Anion gap: 6 (ref 5–15)
BUN: 15 mg/dL (ref 6–20)
CO2: 25 mmol/L (ref 22–32)
Calcium: 8.7 mg/dL — ABNORMAL LOW (ref 8.9–10.3)
Chloride: 101 mmol/L (ref 98–111)
Creatinine, Ser: 0.63 mg/dL (ref 0.61–1.24)
GFR, Estimated: >60 mL/min (ref >60)
Glucose, Bld: 90 mg/dL (ref 70–99)
Potassium: 3.5 mmol/L (ref 3.5–5.1)
Sodium: 132 mmol/L — ABNORMAL LOW (ref 135–145)
Total Bilirubin: 0.9 mg/dL (ref 0.3–1.2)
Total Protein: 8 g/dL (ref 6.5–8.1)

## 2022-01-16 LAB — CBC
HCT: 26.3 % — ABNORMAL LOW (ref 39.0–52.0)
Hemoglobin: 8.4 g/dL — ABNORMAL LOW (ref 13.0–17.0)
MCH: 25.8 pg — ABNORMAL LOW (ref 26.0–34.0)
MCHC: 31.9 g/dL (ref 30.0–36.0)
MCV: 80.9 fL (ref 80.0–100.0)
Platelets: 349 10*3/uL (ref 150–400)
RBC: 3.25 MIL/uL — ABNORMAL LOW (ref 4.22–5.81)
RDW: 17.5 % — ABNORMAL HIGH (ref 11.5–15.5)
WBC: 6.9 10*3/uL (ref 4.0–10.5)
nRBC: 0 % (ref 0.0–0.2)

## 2022-01-16 LAB — MAGNESIUM: Magnesium: 1.8 mg/dL (ref 1.7–2.4)

## 2022-01-16 LAB — PROTIME-INR
INR: 2.3 — ABNORMAL HIGH (ref 0.8–1.2)
Prothrombin Time: 24.9 seconds — ABNORMAL HIGH (ref 11.4–15.2)

## 2022-01-16 MED ORDER — LACTULOSE 10 GM/15ML PO SOLN: 10.0000 g | Freq: Once | ORAL | Status: DC

## 2022-01-16 MED ORDER — ONDANSETRON 4 MG PO TBDP
4.0000 mg | ORAL_TABLET | Freq: Once | ORAL | Status: AC
  Administered 2022-01-16: 4 mg via ORAL
  Filled 2022-01-16: qty 1

## 2022-01-16 MED ORDER — POTASSIUM CHLORIDE CRYS ER 20 MEQ PO TBCR
40.0000 meq | EXTENDED_RELEASE_TABLET | Freq: Once | ORAL | Status: AC
Start: 2022-01-16 — End: 2022-01-16
  Administered 2022-01-16: 40 meq via ORAL
  Filled 2022-01-16: qty 2

## 2022-01-16 MED ORDER — WARFARIN SODIUM 5 MG PO TABS
6.0000 mg | ORAL_TABLET | Freq: Once | ORAL | Status: AC
  Administered 2022-01-16: 6 mg via ORAL
  Filled 2022-01-16: qty 1

## 2022-01-16 MED ORDER — MAGNESIUM SULFATE 2 GM/50ML IV SOLN
2.0000 g | Freq: Once | INTRAVENOUS | Status: DC
Start: 2022-01-16 — End: 2022-01-16

## 2022-01-16 MED ORDER — SENNA 8.6 MG PO TABS
2.0000 | ORAL_TABLET | Freq: Two times a day (BID) | ORAL | Status: DC
  Filled 2022-01-16: qty 2

## 2022-01-16 MED ORDER — POLYETHYLENE GLYCOL 3350 17 G PO PACK
17.0000 g | PACK | Freq: Once | ORAL | Status: AC
  Administered 2022-01-16: 17 g via ORAL
  Filled 2022-01-16: qty 1

## 2022-01-16 MED ORDER — MAGNESIUM SULFATE 4 GM/100ML IV SOLN
4.0000 g | Freq: Once | INTRAVENOUS | Status: AC
  Administered 2022-01-16: 4 g via INTRAVENOUS
  Filled 2022-01-16: qty 100

## 2022-01-16 MED ORDER — CARVEDILOL 3.125 MG PO TABS
3.1250 mg | ORAL_TABLET | Freq: Two times a day (BID) | ORAL | Status: DC
  Administered 2022-01-16 – 2022-01-17 (×3): 3.125 mg via ORAL
  Filled 2022-01-16 (×4): qty 1

## 2022-01-16 MED ORDER — LACTULOSE 10 GM/15ML PO SOLN
10.0000 g | Freq: Once | ORAL | Status: AC
  Administered 2022-01-16: 10 g via ORAL

## 2022-01-16 MED ORDER — LACTULOSE 10 GM/15ML PO SOLN
20.0000 g | Freq: Once | ORAL | Status: AC
  Administered 2022-01-16: 20 g via ORAL
  Filled 2022-01-16: qty 30

## 2022-01-16 MED ORDER — LACTULOSE 10 GM/15ML PO SOLN
10.0000 g | Freq: Two times a day (BID) | ORAL | Status: DC
  Filled 2022-01-16: qty 15

## 2022-01-16 NOTE — Telephone Encounter (Signed)
Patient Advocate Encounter   Received notification that prior authorization for Entresto 24-26MG  tablets is required.   PA submitted on 01/16/2022 Key [id number] Status is pending       Roland Earl, CPhT Pharmacy Patient Advocate Specialist [facility] Patient Advocate Team Direct Number: [phone]  Fax: [phone]

## 2022-01-16 NOTE — Telephone Encounter (Signed)
Pharmacy Patient Advocate Encounter  Insurance verification completed.    The patient is insured through [facility] [place]   The patient is currently admitted and ran test claims for the following: Entresto.  Copays and coinsurance results were relayed to Inpatient clinical team.

## 2022-01-16 NOTE — TOC Benefit Eligibility Note (Signed)
Patient Product/process development scientist completed.    The patient is currently admitted and upon discharge could be taking Entresto 24-26 mg.  Prior Authorization Required  The patient is insured through [place] [employer]     Roland Earl, CPhT Pharmacy Patient Advocate Specialist [facility] Patient Advocate Team Direct Number: [phone]  Fax: [phone]

## 2022-01-16 NOTE — Plan of Care (Signed)
  Problem: Education: Goal: Knowledge of General Education information will improve Description: Including pain rating scale, medication(s)/side effects and non-pharmacologic comfort measures Outcome: Progressing   Problem: Health Behavior/Discharge Planning: Goal: Ability to manage health-related needs will improve Outcome: Progressing   Problem: Clinical Measurements: Goal: Ability to maintain clinical measurements within normal limits will improve Outcome: Progressing Goal: Will remain free from infection Outcome: Progressing Goal: Diagnostic test results will improve Outcome: Progressing Goal: Cardiovascular complication will be avoided Outcome: Progressing   Problem: Activity: Goal: Risk for activity intolerance will decrease Outcome: Progressing   Problem: Nutrition: Goal: Adequate nutrition will be maintained Outcome: Progressing   Problem: Elimination: Goal: Will not experience complications related to bowel motility Outcome: Progressing Goal: Will not experience complications related to urinary retention Outcome: Progressing   Problem: Pain Managment: Goal: General experience of comfort will improve Outcome: Progressing   Problem: Safety: Goal: Ability to remain free from injury will improve Outcome: Progressing   Problem: Skin Integrity: Goal: Risk for impaired skin integrity will decrease Outcome: Progressing   Problem: Education: Goal: Ability to demonstrate management of disease process will improve Outcome: Progressing Goal: Ability to verbalize understanding of medication therapies will improve Outcome: Progressing Goal: Individualized Educational Video(s) Outcome: Progressing   Problem: Activity: Goal: Capacity to carry out activities will improve Outcome: Progressing   Problem: Cardiac: Goal: Ability to achieve and maintain adequate cardiopulmonary perfusion will improve Outcome: Progressing   Problem: Education: Goal: Ability to describe  self-care measures that may prevent or decrease complications (Diabetes Survival Skills Education) will improve Outcome: Progressing Goal: Individualized Educational Video(s) Outcome: Progressing   Problem: Coping: Goal: Ability to adjust to condition or change in health will improve Outcome: Progressing   Problem: Fluid Volume: Goal: Ability to maintain a balanced intake and output will improve Outcome: Progressing   Problem: Health Behavior/Discharge Planning: Goal: Ability to identify and utilize available resources and services will improve Outcome: Progressing Goal: Ability to manage health-related needs will improve Outcome: Progressing   Problem: Metabolic: Goal: Ability to maintain appropriate glucose levels will improve Outcome: Progressing   Problem: Nutritional: Goal: Maintenance of adequate nutrition will improve Outcome: Progressing Goal: Progress toward achieving an optimal weight will improve Outcome: Progressing   Problem: Skin Integrity: Goal: Risk for impaired skin integrity will decrease Outcome: Progressing   Problem: Tissue Perfusion: Goal: Adequacy of tissue perfusion will improve Outcome: Progressing

## 2022-01-16 NOTE — Progress Notes (Signed)
Daily Progress Note Intern Pager: [phone]  Patient name: Donald [name] Medical record number: [id number] Date of birth: [birth year] Age: 36 y.o. Gender: male  Primary Care Provider: No primary care provider on file. Consultants: pharmacy, cardiology, orthopedic surgery, IR Code Status: FULL  Pt Overview and Major Events to Date:  -714 admitted FPTS for sepsis, N/V. Started on flagyl, cefepime, vanc -7/15 worsening shock> ICU> intubated, pressors, abx. Flagyl stopped -7/16 decreasing milrinone for high CO and co-ox -7/19 extubated -7/21 drop in cardiac index, Levophed restarted, Swan discontinued -7/22 levophed stopped -7/23 milrinone stopped -7/24 out to FMTS -7/27 acute stroke  and swelling of L upper arm, CT w/ pyomyositis -7/31 aspiration of L upper arm completed (GS negative, cultures pending) -8/2 LUE pyomyositis + hemorrhagic component vs large hematoma -8/3 per orthopedic surgery, needs transfer to tertiary care center   Assessment and Plan:  Donald [name] is a 36 y.o. 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 INR 2.3 (8/2) - Continue vancomycin and cefepime through PICC.  Patient is set up to go home with home antibiotic infusions. End date 8/27. - per pharmacy, hold warfarin, continue heparin  Left arm pain and swelling CT with contrast concerning for cellulitis and pyomyositis of left axilla extending to coracobrachialis.  Surgery consulted, attempted bedside aspiration without success, decline further surgical management at this time given high risk on anticoagulation. IR following, aspiration completed, GS negative, no organisms, cultures pending -F/u IR recs, INR -Continue  broad spectrum abx as above, discussed regimen with ID 7/30 -increased diameter from 16 in to 17 in s/p aspiration, decreased ROM, pulse, and sensation - MRI humerus with IV contrast: cellulitis and pyomyositis + hemorrhagic component vs large hematoma - orthopedic surgery following  Acute embolic stroke (HCC) Stable.  CTA head/neck negative for aneurysm, so stroke team recommended to continue current antithrombotics. - continue rosuvastatin 40 mg daily -Stroke team signed off, plan to follow-up with stroke clinic NP in [facility] in about 4 weeks.  Paroxysmal atrial fibrillation (HCC) Stable, rate controlled. -Continue amiodarone 200 mg daily -Cardiology following, appreciate conditions.   Chronic heart failure (HCC) Stable at this time. - RESUMED furosemide 40 mg - continue sacubitril-valsartan Donald [name]) - continue spironolactone 25 mg - Continue digoxin 0.125 mg and Farxiga 10 mg - Appreciate heart failure team care and recommendations   Hypomagnesemia Mg 1.8 (8/3)   Iron deficiency anemia Stable, chronic.  Hemoglobin near baseline.  Continue to monitor. - start iron supplementation (ferrous sulfate 325 mg, every other day)  Prolonged QT interval Last QTc 495 on 7/29  AKI (acute kidney injury) (HCC)-resolved as of 01/07/2022 Resolved with improving shock. -Continue fluids, PO as tolerated -AM BMP  Shock, septic and cardiogenic-resolved as of 01/11/2022 Resolved since out of ICU. On vanc, cefepime, midodrine. -Monitor VS -Continue abx per endocarditis  Gastroenteritis-resolved as of 01/11/2022     FEN/GI: senna BID, PEG BID, lactulose BID, ondansetron for nausea PPx: warfarin Dispo: transfer to tertiary care center   pending bed availability . Barriers include lack of bed availability.   Subjective:  Patient slept well. Endorses adequate pain control. Still constipated (day 4). Feeling nauseous.  Patient aware of discussion regarding  transfer to tertiary care center. He is amenable to transfer to  higher level of care.  Objective: Temp:  [97.7 F (36.5 C)-97.9 F (36.6 C)] 97.7 F (36.5 C) (08/03 0500) Pulse Rate:  [70-80] 70 (08/03 0500) Resp:  [16-19] 19 (08/03 0500) BP: (111-152)/(67-89) 138/73 (08/03 0500) SpO2:  [97 %-100 %] 98 % (08/03 0500) Weight:  [199 lb 11.8 oz (90.6 kg)] 199 lb 11.8 oz (90.6 kg) (08/03 0500) Physical Exam: General: well appearing, in NAD HEENT: normocephalic, atraumatic Skin: warm, dry Cardiovascular: regular rate Respiratory: CTAB, normal rate on RA Abdomen: non-distended Extremities: Per nursing note, arm measuring 16 in today (down from 17 in yesterday). Unable to spontaneously move LUE, swollen around bicep. LUE warm, +1 pulses, mild tenderness to palpation on lower arm, but tender on passive ROM.  Laboratory: Most recent CBC Lab Results  Component Value Date   WBC 6.9 01/16/2022   HGB 8.4 (L) 01/16/2022   HCT 26.3 (L) 01/16/2022   MCV 80.9 01/16/2022   PLT 349 01/16/2022   Most recent BMP    Latest Ref Rng & Units 01/16/2022    5:20 AM  BMP  Glucose 70 - 99 mg/dL 90   BUN 6 - 20 mg/dL 15   Creatinine 6.30 - 1.24 mg/dL 1.60   Sodium 109 - 323 mmol/L 132   Potassium 3.5 - 5.1 mmol/L 3.5   Chloride 98 - 111 mmol/L 101   CO2 22 - 32 mmol/L 25   Calcium 8.9 - 10.3 mg/dL 8.7     Other pertinent labs  INR 2.3 Mg 1.5  [name], MD 01/16/2022, 7:20 AM  PGY-1, [facility] FPTS Intern pager: [phone], text pages welcome Secure chat group [facility] Teaching Service

## 2022-01-16 NOTE — Progress Notes (Addendum)
Noted [name] recommendation for transfer to a tertiary care center for possible consultation with orthopedic oncology for the mass in his left axilla. Discussed his case with Dr. Jana Half, orthopedist on call at [facility], she feels that given his complex cardiac history, he is too medically complex for the orthopedic service and would need admission to a medical service. Per physician access line, medical services at [facility] are full with waitlists >48 hours. We are unable to get him placed on a waitlist at this time. Discussed care with [facility] transfer center, all medical and surgical services are full and waitlists are unavailable at this time. Called [facility] transfer center and discussed patient care with Dr. Lajoyce Corners from the hospitalist service. She is going to take the case to their in-house orthopedics team to ensure that they would be willing to consult. If orthopedics at [facility] is interested in the case, she would be willing to accept. However, there will be a waitlist. Dr. Lajoyce Corners and the transfer center have contact information for our service and we will await further information once they have had a chance to discuss with orthopedics. I am hopeful that we will be able to transfer him in the next 24-48 hours.    Update 1400 Patient has been accepted to the waitlist at [facility], Dr. Audree Bane accepting physician. [facility]opedics has reviewed his images through [employer] and will consult on his case once he has been transported to [facility]. The [facility] transfer center will contact 4E and page our service once a bed is available and transport can be arranged.    Dorothyann Gibbs, MD

## 2022-01-16 NOTE — Progress Notes (Signed)
Mobility Specialist: Progress Note   01/16/22 1357  Mobility  Activity Refused mobility   Pt refused mobility with c/o LUE pain as well as recent emesis. Will f/u as able.   [facility] Jahnavi Muratore Mobility Specialist Mobility Specialist [address]: [phone]

## 2022-01-16 NOTE — Progress Notes (Signed)
Pharmacy Antibiotic Note  Donald [name] is a 36 y.o. male for which pharmacy has been consulted for cefepime and vancomycin dosing for prosthetic valve endocarditis.   Patient with a history of mitral regurgitation s/p valve replacement on Coumadin, HF, AF and prior DVT, and Hx of hidradenitis and pyoderma gangrenosum . Found to have a vegetation on mechanical mitral valve. Blood cultures are negative on 7/14 and 7/16. Scr stable at 0.82.   Cr has remained stable, will check surveillance levels tomorrow.  Plan: Continue Vancomycin at 750 mg every 12 hours  Goal AUC 400-550, check levels tomorrow Cefepime 2g IV q8h   Height: 5\' 10"  (177.8 cm) Weight: 90.6 kg (199 lb 11.8 oz) IBW/kg (Calculated) : 73  Temp (24hrs), Avg:97.8 F (36.6 C), Min:97.7 F (36.5 C), Max:97.9 F (36.6 C)  Recent Labs  Lab 01/12/22 0125 01/12/22 1200 01/13/22 0430 01/14/22 0419 01/15/22 0838 01/16/22 0520  WBC 8.3  --  8.9 8.8 8.5 6.9  CREATININE 0.82  --  0.72 0.62 0.67 0.63  VANCOTROUGH  --  9*  --   --   --   --   VANCOPEAK 28*  --   --   --   --   --      Estimated Creatinine Clearance: 148.6 mL/min (by C-G formula based on SCr of 0.63 mg/dL).    No Known Allergies  Antimicrobials this admission: cefepime 7/14 >> (8/24) vancomycin 7/14 >> (8/24) metronidazole 7/14 >> 7/14  Microbiology results: 7/15 BCx x 2 > ngf 7/14 BCx  ngf 7/16 Resp Cx > ngf   8/16, PharmD, [place], [facility] Clinical Pharmacist [phone] Please check AMION for all [facility] numbers 01/16/2022

## 2022-01-16 NOTE — Discharge Summary (Addendum)
[facility] Transfer Summary  Patient name: Donald [name] Medical record number: [id number] Date of birth: Mar 16, 1986 Age: 36 y.o. Gender: male Date of Admission: 12/27/2021  Date of Transfer: 01/17/2022 Admitting Physician: Martyn Malay, MD  Primary Care Provider: No primary care provider on file. Consultants: Heart Failure, CCM, IR, General Surgery, Ortho Surgery   Indication for Hospitalization: Nausea, vomiting> Endocarditis of MV  Brief Hospital Course:  Donald [name] is a 36 y.o. 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 Neg CXR and UA. Vanc and Cefepime started, Bcx obtained. Subsequently developed tachypnea and new ST depressions on HD #2. Patient then had acute decompensation and was transferred briefly to the ICU for shock where he was intubated with pressors. TTE/TEE with severe biventricular failure and vegetation on mechanical MV. ID continued abx (Vanc and Cefepime) due to neg cultures (though not enough cultures initially drawn). Eventually extubated but still needed BP support given severe HF. Came off pressors and transferred to floor. Had PICC line insertion for 6 weeks abx admin. Last day antibiotics 02/06/2022.   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 Also had pain and swelling (see below). Given concern for neurologic  dysfunction, CT head ordered and was negative for hemorrhage. MRI with findings of multiple small embolic strokes in subcortical right frontoparietal region as well as a small remote right cerebellar infarct. CTA head/neck negative for aneurysm. Neurology signed off given antithrombotics and statin already initiated. He remained stable neurologically over admission. Patient to follow up with neuro's stroke clinic in 4 weeks. Started on rosuvastatin 7/29   Pyomyositis of LUE Presented on day 13 of admission (7/27) with L upper arm swollen and painful to palpation. Duplex US negative for DVT but showed undifferentiated mass. CT with findings suspicious for pyomyositis. General surgery attempted beside aspiration without success. IR consulted and performed aspiration on 7/31, able to get 100 mL of thin bloody fluid. Continued oxy IR for pain. He was also continued on broad-spectrum abx (Vanc and cefepime) per ID. Despite aspiration, his arm continued to increase in size (largest 17 cm in diameter, on day 2 after aspiration, 8/2). Given increased swelling, pain, no AROM, very little PROM, decreased sensation up to mid arm, MRI humerus was obtained and ortho surgery was consulted. Given worsening exam findings and concerning MRI there was concern for possible soft tissue malignancy. Patient was recommended for transfer to a tertiary care center for dissection of mass. Discussed with [facility] who accepted him for transfer on 8/3. He was transferred when a bed came available on 8/4.   HFrEF, EF<20%  Mitral Valve Replacement Presented with elevated BNP, though exam and CXR without overt signs of fluid overload. Home meds initially held due to AKI and dehydration. Echo showed severe HF, with EF reduced to <20%. Cardiology placed patient on lasix. Home meds for goal directed medical therapy were added back as tolerated until discharge where he was euvolemic and asymptomatic. He was also placed on  aspirin 81 mg at discharge per heart failure physician given prosthetic valve. Complete list of HF medications at date of transfer (8/4) - aspirin, carvedilol, dapagliflozin (Farxiga), digoxin, sacubitril-valsartan (Entresto), spironolactone  AKI due to sepsis Initially presented with elevated Cr to 1.4, which worsened initially with deteriorating clinical state. Nephrotoxic medications were held as able, and patient given fluids and supportive care until resolution. Creatinine upon discharge was back to baseline ~0.6-0.8.  Paroxysmal Atrial Fibrillation Patient has known history of PAF, presented to hospital in atrial fibrillation. On admission, patient's INR was subtherapeutic and patient was bridged with heparin. Home amiodarone was held due to development of shock liver while in ICU. Patient was able to resume amiodarone once liver improved. Patient was also able to transition to warfarin with lovenox bridge by discharge with follow up scheduled for INR checks.  Shock liver While in the ICU, patient's LFT's were significantly elevated (AST max 4576, ALT max 2449) secondary to septic/cardiogenic shock and severe heart failure. Liver enzymes improved with supportive care and patient was able to resume liver-metabolized medications. On transfer AST had down-trended to 92, ALT 60.   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 Althea Charon Same Day Process Note  Significant Procedures:  7/15: Transferred to ICU. Intubated and on pressors 7/19: Extubated  8/3: Accepted for transfer to [facility] and Imaging:  Recent Labs  Lab 01/16/22 0520 01/17/22 0157  WBC 6.9 7.0  HGB 8.4* 8.2*  HCT 26.3* 25.7*  PLT 349 339   Recent Labs  Lab 01/16/22 0520 01/17/22 0157  NA 132* 134*  K 3.5 4.0  CL 101 104  CO2 25 25  GLUCOSE 90 85  BUN 15 17  CREATININE 0.63 0.69  CALCIUM 8.7* 8.8*  MG 1.8 1.7  ALKPHOS 119  --   AST 92*  --   ALT 60*  --   ALBUMIN 2.1*  --    CXR 7/14 IMPRESSION: 1. Cardiomegaly without failure. 2. No acute cardiopulmonary disease.   CTAP 7/14 IMPRESSION: 1. No acute intra-abdominal or pelvic pathology. No bowel obstruction. Normal appendix. 2. Hepatomegaly. 3. Partially visualized induration of the skin and subcutaneous soft tissues adjacent to the intergluteal cleft and superficial to the coccyx. The no definite drainable fluid collection identified by CT. This can be better evaluated with ultrasound to assess for possible drainage.   CXR 7/15 IMPRESSION: Stable cardiomegaly. No acute cardiopulmonary abnormality. Remainder of studies to confirm positioning of feeding tube, CVC, endotracheal tube  Echo 7/15 IMPRESSIONS  1. Left ventricular ejection fraction, by estimation, is <20%. The left  ventricle has severely decreased function. The left ventricle demonstrates  global hypokinesis. The left ventricular internal cavity size was severely  dilated. Left ventricular  diastolic function could not be evaluated.   2. Right ventricular systolic function is moderately reduced. The right  ventricular size is moderately enlarged. There is moderately elevated  pulmonary artery systolic pressure.   3. Right atrial size was moderately dilated.   4. Technically challenging images. One of the  mitral leaflets can be seen  moving, but the other is obscured. Gradient is expected but with very low  stroke volume, this may be a falsely low gradient. The mitral valve has  been repaired/replaced. Trivial  mitral valve regurgitation. The mean mitral valve gradient is 5.5 mmHg.  There is a 31 mm [employer] mechanical valve present in the mitral position.  Procedure Date: 06/10/2021.   5. Tricuspid valve regurgitation is moderate.   6. The aortic valve is tricuspid. Aortic valve regurgitation is trivial.   7. Pulmonic valve regurgitation is moderate.   8. The inferior vena cava is dilated in size with <50% respiratory  variability, suggesting right atrial pressure of 15 mmHg.   CT Head 7/27 IMPRESSION: Stable head CT without acute intracranial findings. No CT evidence of acute stroke.  MRI Brain 7/27 IMPRESSION: 1. Patchy small volume acute ischemic nonhemorrhagic cortical infarcts involving the subcortical right frontoparietal region. These are likely embolic in nature. 2. Underlying mild chronic microvascular ischemic disease, advanced for age. Small remote right cerebellar infarct. 3. Multiple scattered chronic micro hemorrhages involving the cerebellum and both cerebral hemispheres, nonspecific, but favored to be secondary to poorly controlled hypertension.  UE DVT U/S 7/27 Summary:  Right:  No evidence of thrombosis in the subclavian.  Left:  No evidence of deep vein thrombosis in the upper extremity. No evidence of  superficial vein thrombosis in the upper extremity.   CT Left Upper Extremity 7/28 IMPRESSION: Findings most consistent with cellulitis and pyomyositis involving the left axilla and anterior compartment of the left upper arm. Large collection is in the left axilla and extends into the coracobrachialis as described above. MRI with IV contrast is a more sensitive test for detection pyomyositis.  CTA Head and Neck 7/28 IMPRESSION: CT head: 1. Known small  right frontoparietal lobe acute infarcts were better appreciated on the brain MRI of 01/09/2022. 2. Background mild chronic small vessel image changes within the cerebral white matter. 3. Redemonstrated small chronic infarcts within the right cerebellar hemisphere. 4. Small right mastoid effusion.   CTA neck: 1. The common carotid, internal carotid and vertebral arteries are patent within the neck without stenosis or significant atherosclerotic disease. 2. Partially imaged nonspecific edema/stranding within the left axilla. Correlate clinically and consider dedicated imaging.   CTA head: 1. No intracranial large vessel occlusion or proximal high-grade arterial stenosis. 2. Cystic-appearing lesions within the left temporal and preauricular soft tissues, measuring up to 1.7 cm. Direct visualization recommended.  [place] Fine Needle Aspiration 7/31 IMPRESSION: 1. Technically successful ultrasound-guided aspiration of left upper arm intramuscular collection, returning 100 mL thin bloody fluid, sent for Gram stain and culture.  MRI Left Humerus 8/2 IMPRESSION: Large heterogeneous intramuscular collection in the anterior compartment of the left upper extremity, measuring up to 7.2 x 5.7 axially and 18.0 cm in craniocaudal extent. Extensive associated soft tissue edema throughout the left upper extremity extending to the axilla and lower neck, and distally beyond the elbow into the forearm. Marked skin thickening medially. Findings are suggestive of cellulitis and pyomyositis. Intrinsic T1 signal and blooming artifact suggests the presence of a hemorrhagic component, and this collection could also simply be a large hematoma. No evidence of osteomyelitis or acute fracture.  Results/Tests Pending at Time of Discharge:  None  Discharge Medications:  Allergies  as of 01/17/2022   No Known Allergies      Medication List     STOP taking these medications    doxycycline 100 MG  capsule Commonly known as: VIBRAMYCIN   losartan 25 MG tablet Commonly known as: COZAAR   oxyCODONE-acetaminophen 5-325 MG tablet Commonly known as: PERCOCET/ROXICET   pantoprazole 40 MG tablet Commonly known as: PROTONIX   predniSONE 20 MG tablet Commonly known as: DELTASONE       TAKE these medications    acetaminophen 325 MG tablet Commonly known as: TYLENOL Take 2 tablets (650 mg total) by mouth every 4 (four) hours as needed for headache or mild pain.   amiodarone 200 MG tablet Commonly known as: PACERONE Take 1 tablet (200 mg total) by mouth daily. What changed: See the new instructions.   Aspirin Low Dose 81 MG chewable tablet Generic drug: aspirin Chew 1 tablet (81 mg total) by mouth daily.   carvedilol 3.125 MG tablet Commonly known as: COREG Take 1 tablet (3.125 mg total) by mouth 2 (two) times daily with a meal.   ceFEPime  IVPB Commonly known as: MAXIPIME Inject 2 g into the vein every 8 (eight) hours. 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   digoxin 0.125 MG tablet Commonly known as: LANOXIN Take 1 tablet (0.125 mg total) by mouth daily.   enoxaparin 80 MG/0.8ML injection Commonly known as: LOVENOX Inject 0.8 mLs (80 mg total) into the skin every 12 (twelve) hours. Adjust as directed by heart failure and warfarin clinic. To be used only until warfarin is at correct level.   Farxiga 10 MG Tabs tablet Generic drug: dapagliflozin propanediol Take 1 tablet (10 mg total) by mouth daily.   feeding supplement Liqd Take 237 mLs by mouth 3 (three) times daily between meals.    ferrous sulfate 325 (65 FE) MG tablet Take 1 tablet (325 mg total) by mouth every other day. Start taking on: January 18, 2022   furosemide 40 MG tablet Commonly known as: Lasix Take 1 tablet (40 mg total) by mouth daily as needed. As directed by heart failure clinic. Take potassium on days you take Lasix. What changed: You were already taking a medication with the same name, and this prescription was added. Make sure you understand how and when to take each.   furosemide 40 MG tablet Commonly known as: LASIX Take 1 tablet (40 mg total) by mouth daily. Start taking on: January 18, 2022 What changed: Another medication with the same name was added. Make sure you understand how and when to take each.   midodrine 2.5 MG tablet Commonly known as: PROAMATINE Take 1 tablet (2.5 mg total) by mouth 3 (three) times daily with meals.   oxyCODONE 5 MG immediate release tablet Commonly known as: Oxy IR/ROXICODONE Take 1 tablet (5 mg total) by mouth every 3 (three) hours as needed for moderate pain or severe pain.   polyethylene glycol 17 g packet Commonly known as: MIRALAX / GLYCOLAX Take 17 g by mouth 2 (two) times daily.   potassium chloride SA 20 MEQ tablet Commonly known as: KLOR-CON M Take 2 tablets (40 mEq total)  by mouth daily as needed. Take potassium on days you take Lasix. What changed:  when to take this reasons to take this additional instructions   rosuvastatin 40 MG tablet Commonly known as: CRESTOR Take 1 tablet (40 mg total) by mouth daily. Start taking on: January 18, 2022   sacubitril-valsartan 49-51 MG Commonly known as: ENTRESTO Take 1 tablet by mouth 2 (two) times daily.   senna 8.6 MG Tabs tablet Commonly known as: SENOKOT Take 2 tablets (17.2 mg total) by mouth 2 (two) times daily.   spironolactone 25 MG tablet Commonly known as: ALDACTONE Take 1 tablet (25 mg total) by mouth daily. Start taking on: January 18, 2022 What changed: how much to take    vancomycin  IVPB Inject 1,000 mg into the vein every 12 (twelve) hours. 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 Labs - Thursday:  BMP and vancomycin trough Labs - Every other week:  ESR and CRP Method of administration:Elastomeric Method of administration may be changed at the discretion of the patient and/or caregiver's ability to self-administer the medication ordered.   vancomycin 1-5 GM/200ML-% Soln Commonly known as: VANCOCIN Inject 200 mLs (1,000 mg total) into the vein every 12 (twelve) hours. Start taking on: January 18, 2022   warfarin 2 MG tablet Commonly known as: COUMADIN Take as directed. If you are unsure how to take this medication, talk to your nurse or doctor. Original instructions: Take 6 mg (3 tablets) on 7/28 then 4 mg (2 tablets) daily until your coumadin clinic appointment on 7/31. What changed: additional instructions               Discharge Care Instructions  (From admission, onward)           Start     Ordered   01/08/22 0000  Change dressing on IV access line weekly and PRN  (Home infusion instructions - [facility] )        07" /26/23 1224           Discharge Instructions: Please refer to Patient Instructions section of EMR for full details.  Patient was counseled important signs and symptoms that should prompt return to medical care, changes in medications, dietary instructions, activity restrictions, and follow up appointments.   Follow-Up Appointments:  Follow-up Information      [facility] Follow up on 01/29/2022.   Specialty: Cardiology Why: [facility] 3:30 pm Entrance C, Free Valet Parking Contact information: [address] [id number] [place] [facility]  [facility]. Go on 01/13/2022.   Specialty: Cardiology Why: Please arrive at 3:15 pm for your 3:30 pm apointment to have your INR checked for your Warfarin levels. Contact information: [place]        Thayer Headings, MD. Daphane Shepherd on 02/04/2022.   Specialty: Infectious Diseases Why: Please arrive at 10:15 am for your 10:30 am appointment. Contact information: [address] [place] [address] [phone]         [facility]. Schedule an appointment as soon as possible for a visit in 1 month(s).   Specialty: Neurology Why: stroke clinic Contact information: [place] [phone]  Eppie Gibson, MD 01/17/2022, 5:14 PM PGY-2, [place]

## 2022-01-16 NOTE — Progress Notes (Signed)
ANTICOAGULATION CONSULT NOTE  Pharmacy Consult for warfarin Indication:  MVR /AFib  No Known Allergies  Patient Measurements: Height: 5\' 10"  (177.8 cm) Weight: 90.6 kg (199 lb 11.8 oz) IBW/kg (Calculated) : 73 Heparin Dosing Weight: 90.7 kg  Vital Signs: Temp: 97.7 F (36.5 C) (08/03 0500) Temp Source: Oral (08/03 0500) BP: 138/73 (08/03 0500) Pulse Rate: 70 (08/03 0500)  Labs: Recent Labs    01/14/22 0419 01/14/22 2218 01/15/22 0838 01/16/22 0520  HGB 8.5*  --  8.4* 8.4*  HCT 27.0*  --  26.8* 26.3*  PLT 360  --  328 349  LABPROT 24.5*  --  25.2* 24.9*  INR 2.2*  --  2.3* 2.3*  HEPARINUNFRC  --  0.26* 0.33  --   CREATININE 0.62  --  0.67 0.63     Estimated Creatinine Clearance: 148.6 mL/min (by C-G formula based on SCr of 0.63 mg/dL).   Medical History: Past Medical History:  Diagnosis Date   Anemia    Autoimmune disorder (HCC)    pyoderma gangrenosum   CHF (congestive heart failure) (HCC)    Chronic systolic heart failure (HCC)    a. EF 35-40% by echo in 07/2018 b. EF at 45% by repeat echo in 04/2020   DVT (deep venous thrombosis) (HCC)    h/o   Dysrhythmia    Mitral regurgitation    a. 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 b. 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HCC)    arthritis   Shock, septic and cardiogenic 12/28/2021   Tricuspid regurgitation    Assessment: 35 yom with a history of mitral regurgitation s/p mechanical valve replacement on Warfarin, HF, AF and prior DVT.   Patient taking warfarin PTA 4mg  daily per last outpatient [facility] note.  Warfarin held briefly with L arm swelling requiring aspiration in IR 7/31, now resume. Holding off on IV heparin drip with concerns for arm hematoma unless INR drops <2. INR today  is 2.3, CBC stable.   Goal of Therapy:  INR goal 2.5-3.5 Monitor platelets by anticoagulation protocol: Yes   Plan:  Warfarin 6mg  PO x1 tonight Daily INR  [facility], PharmD, BCPS, [facility] Clinical Pharmacist [phone] Please check AMION for all [facility] Pharmacy numbers 01/16/2022

## 2022-01-16 NOTE — Telephone Encounter (Signed)
Patient Advocate Encounter  Prior Authorization for [name] 24-26MG  tablets  has been approved.    PA# [id number] Effective dates: 01/16/2022 through 01/16/2023  Patients co-pay is $4.00.     Roland Earl, CPhT Pharmacy Patient Advocate Specialist [facility] Patient Advocate Team Direct Number: [phone]  Fax: [phone]

## 2022-01-16 NOTE — Progress Notes (Signed)
Seen pt from [phone], pt declined ambulation due to being in pain. Spent time offering support.   [name] 01/16/2022 2:30 PM

## 2022-01-16 NOTE — Progress Notes (Signed)
Patient left upper arm at bicep measures 16 inches in circumference with ace wrap off and is painful to touch.

## 2022-01-16 NOTE — Progress Notes (Addendum)
Advanced Heart Failure Rounding Note   Subjective:   7/19 Extubated. Diuresed with IV lasix. I/O not accurate. Norepi weaned off.  7/20 Swan out  7/23 Milrinone off 7/27 Acute LUE weakness/swelling. CODE Stroke. MRI w/ several small acute right MCA territory ischemic strokes, suggestive of a cardioembolic etiology. UE CT with contrast concerning for cellulitis and pyomyositis of left axilla 7/31 s/p  IR aspiration of 100 cc thin bloody fluid from left axilla. Cx NGTD. Midodrine discontinued w/ elevated BPs. Low dose spiro restarted   Entresto started yesterday. BP stable. SCr ok, 0.63. K 3.5   INR 2.3 today   Mg better at 1.8   Continues w/ LUE Pain. Also w/ n/v this morning w/ breakfast. No dyspnea.   Remains on On vanc/cefepime for pMVR endocarditis. Has PICC for continuation of home IV abx.      LUE MRI 8/2 IMPRESSION: Large heterogeneous intramuscular collection in the anterior compartment of the left upper extremity, measuring up to 7.2 x 5.7 axially and 18.0 cm in craniocaudal extent. Extensive associated soft tissue edema throughout the left upper extremity extending to the axilla and lower neck, and distally beyond the elbow into the forearm. Marked skin thickening medially. Findings are suggestive of cellulitis and pyomyositis. Intrinsic T1 signal and blooming artifact suggests the presence of a hemorrhagic component, and this collection could also simply be a large hematoma.   No evidence of osteomyelitis or acute fracture.   Objective:     Vital Signs:   Temp:  [97.7 F (36.5 C)-97.9 F (36.6 C)] 97.7 F (36.5 C) (08/03 0500) Pulse Rate:  [70-78] 70 (08/03 0500) Resp:  [16-19] 19 (08/03 0500) BP: (111-152)/(67-85) 138/73 (08/03 0500) SpO2:  [97 %-100 %] 98 % (08/03 0500) Weight:  [90.6 kg] 90.6 kg (08/03 0500) Last BM Date : 01/10/22  Weight change: Filed Weights   01/14/22 0412 01/15/22 0336 01/16/22 0500  Weight: 90.9 kg 90.2 kg 90.6 kg     Intake/Output:   Intake/Output Summary (Last 24 hours) at 01/16/2022 0830 Last data filed at 01/16/2022 0300 Gross per 24 hour  Intake 100 ml  Output 2150 ml  Net -2050 ml   PHYSICAL EXAM: General:  Well appearing. No respiratory difficulty HEENT: normal Neck: supple. no JVD. Carotids 2+ bilat; no bruits. No lymphadenopathy or thyromegaly appreciated. Cor: PMI nondisplaced. Regular rate & rhythm. No rubs, gallops or murmurs. Lungs: clear Abdomen: soft, nontender, nondistended. No hepatosplenomegaly. No bruits or masses. Good bowel sounds. Extremities: no cyanosis, clubbing, rash, edema LUE swollen, wrapped in ACE bandage + RUE PICC  Neuro: alert & oriented x 3, cranial nerves grossly intact. moves all 4 extremities w/o difficulty. Affect pleasant.    Telemetry: SR 80s (personally reviewed)   Labs: Basic Metabolic Panel: Recent Labs  Lab 01/12/22 0125 01/13/22 0430 01/14/22 0419 01/15/22 0838 01/16/22 0520  NA 128* 131* 131* 131* 132*  K 4.0 3.8 3.9 4.0 3.5  CL 100 101 101 101 101  CO2 23 23 24 23 25   GLUCOSE 129* 90 101* 97 90  BUN 15 15 14 11 15   CREATININE 0.82 0.72 0.62 0.67 0.63  CALCIUM 8.8* 8.9 8.8* 8.8* 8.7*  MG 1.6* 1.3* 1.5* 1.5* 1.8    Liver Function Tests: Recent Labs  Lab 01/16/22 0520  AST 92*  ALT 60*  ALKPHOS 119  BILITOT 0.9  PROT 8.0  ALBUMIN 2.1*    No results for input(s): "LIPASE", "AMYLASE" in the last 168 hours. No results for input(s): "AMMONIA"  in the last 168 hours.   CBC: Recent Labs  Lab 01/12/22 0125 01/13/22 0430 01/14/22 0419 01/15/22 0838 01/16/22 0520  WBC 8.3 8.9 8.8 8.5 6.9  HGB 9.3* 8.8* 8.5* 8.4* 8.4*  HCT 29.2* 27.1* 27.0* 26.8* 26.3*  MCV 81.6 81.1 82.3 81.7 80.9  PLT 383 347 360 328 349    Cardiac Enzymes: Recent Labs  Lab 01/10/22 0505  CKTOTAL 180    BNP: BNP (last 3 results) Recent Labs    07/24/21 1433 10/11/21 1230 12/27/21 1451  BNP 3,184.1* 400.8* >4,500.0*    ProBNP (last 3  results) No results for input(s): "PROBNP" in the last 8760 hours.    Other results:  Imaging: MR HUMERUS LEFT W WO CONTRAST  Result Date: 01/15/2022 CLINICAL DATA:  Pyomyositis EXAM: MRI OF THE LEFT HUMERUS WITHOUT AND WITH CONTRAST TECHNIQUE: Multiplanar, multisequence MR imaging of the left humerus was performed before and after the administration of intravenous contrast. CONTRAST:  81mL GADAVIST GADOBUTROL 1 MMOL/ML IV SOLN COMPARISON:  Ultrasound 01/13/2022, CT 01/10/2022 FINDINGS: Bones/Joint/Cartilage There are changes of marrow reconversion. There is no evidence of acute fracture. No aggressive osseous lesion. 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 No aggressive osseous lesion. Muscles and Tendons There is a heterogeneous intramuscular collection primarily in the short head biceps, measuring approximately 7.2 x 5.7 axially and 18.0 cm in craniocaudal extent (series 17 image 34, series 100 image 24). There is peripheral intrinsic T1 hypertensity, thick along the inferior aspect and thin along the superior aspect. There is associated rim enhancement. There are fluid-fluid levels along the superior aspect (series 11 iage 25-26) and thick blooming artifact along the inferior aspect. Soft tissues Extensive soft tissue swelling, intramuscular and superficial along the left upper extremity including the axilla, supra/infraclavicular upper chest, along the pectoralis musculature, deltoid, and distally beyond the elbow into the proximal forearm. There is mild soft tissue enhancement. Marked skin thickening medially. Mildly enlarged left axillar lymph node measuring 1.1 cm, likely reactive. IMPRESSION: Large heterogeneous intramuscular collection in the anterior compartment of the left upper extremity, measuring up to 7.2 x 5.7 axially and 18.0 cm in craniocaudal extent. Extensive associated soft tissue edema throughout the left upper extremity extending to the axilla and lower neck, and distally  beyond the elbow into the forearm. Marked skin thickening medially. Findings are suggestive of cellulitis and pyomyositis. Intrinsic T1 signal and blooming artifact suggests the presence of a hemorrhagic component, and this collection could also simply be a large hematoma. No evidence of osteomyelitis or acute fracture. Electronically Signed   By: Caprice Renshaw M.D.   On: 01/15/2022 08:11     Medications:     Scheduled Medications:  acetaminophen  650 mg Oral Q6H   amiodarone  200 mg Oral Daily   aspirin  81 mg Oral Daily   Chlorhexidine Gluconate Cloth  6 each Topical Daily   dapagliflozin propanediol  10 mg Oral Daily   digoxin  0.125 mg Oral Daily   feeding supplement  237 mL Oral TID BM   ferrous sulfate  325 mg Oral QODAY   furosemide  40 mg Oral Daily   polyethylene glycol  17 g Oral BID   rosuvastatin  40 mg Oral Daily   sacubitril-valsartan  1 tablet Oral BID   senna  1 tablet Oral BID   sodium chloride flush  10-40 mL Intracatheter Q12H   spironolactone  25 mg Oral Daily   Warfarin - Pharmacist Dosing Inpatient   Does not apply q1600  Infusions:  sodium chloride 10 mL/hr at 01/15/22 0550   sodium chloride 10 mL/hr at 01/07/22 2200   ceFEPime (MAXIPIME) IV 2 g (01/16/22 0603)   magnesium sulfate bolus IVPB     vancomycin 750 mg (01/15/22 2342)    PRN Medications: sodium chloride, sodium chloride, ipratropium-albuterol, mouth rinse, oxyCODONE, sodium chloride flush   Assessment/Plan:   1. Mixed cardiogenic/septic shock - echo 12/28/21 LVEF 10% RV sev HK - TEE 7/15 severe biventricular failure. + vegetation on mechanical MVR - Bcx - NGTD - Respiratory culture w/ moderate WBCs (mononuclear and PMN) and no organisms  - Cultures no growth.  - ID following. Continue vanc/cefepime. Now AF - Off inotropes/pressors.   2. 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 Repeat echo as above.  - Echo this admit LVEF 10% RV severely HK.  - Off milrinone and NE. - Euvolemic on exam. No dyspnea  - Continue Entresto 24-26 mg bid  - Continue spiro 25 mg daily  - Continue Farxiga 10 mg daily - Continue digoxin 0.125, dig level 0.5. - Long-term only option is transplant. Dr. 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 They will discuss.    3. 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  - Underwent MVR with mechanical mitral valve in 05/2021 at [facility].  - TEE 7/15 severe biventricular failure. + vegetation on mechanical MVR - Bcx NGTD. -> on vanc/cefepime. 6 weeks IV abx, end date 08/24.  - Has PICC for home abx  - ID following. Appreciate assistance. - INR 2.3  (goal 2.5-3.5). Coumadin per pharmacy     4. Acute hypoxic respiratory failure - intubated 7/15 in setting of shock - Extubated 719  - O2 stable on RA. Resolved.  5. Paroxysmal Atrial Fibrillation - In SR  - does not tolerate AF - liver enzymes continue to improve. - back on amio 200 daily  5. AKI - Due to ATN/shock - Baseline creatinine 0.98 - 1.0.  - Peak 2.6. Resolved.   6. Shock liver - continue supportive care - AST /ALT continue trend down.   7. Autoimmune Disorder  - He has seronegative spondylitis and pyoderma gangrenosum. Had been on Humira. - Followed by Rheumatology as an outpatient.   8. Hypomagnesemia/hypokalemia - Mag 1.8 today, K 3.5  - 2 g Mg supp - Continue spiro   9. Deconditioning - PT/OT following. Will need outpatient PT/OT post stroke. - Continue to mobilize.   10. 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 LUE cellulitis and pyomyositis of left axilla  - per GS, too high risk for I&D under general anesthesia - s/p IR aspiration of 100 cc thin bloody fluid from left axilla 7/31. Cx NGTD. - minimal improvement post aspiration. C/w pain and swelling.  - LUE MRI 8/2 findings are suggestive of cellulitis and pyomyositis. Intrinsic T1 signal and blooming artifact suggests the presence of a hemorrhagic component, and this collection could also simply be a large hematoma. Seen by Ortho, also agree may be hematoma. Per IR no plans to repeat aspiration  attempt   - try treatment w/ ACE wrap for compression   Length of Stay: 19  Brittainy Simmons, PA-C  8:30 AM   01/16/2022, 8:30 AM  Advanced Heart Failure Team Pager [phone] (M-F; 7a - 4p)  Please contact [facility] for night-coverage after hours (4p -7a ) and weekends on amion.  Agree with above PA note.   BP and creatinine stable with addition of Entresto.    Still with swelling of left arm.   General: NAD Neck: No JVD, no thyromegaly or thyroid nodule.  Lungs: Clear to auscultation bilaterally with normal respiratory effort. CV: Nondisplaced PMI.  Heart regular S1/S2, mechanical S1, 1/6 SEM.  No peripheral edema.   Abdomen: Soft, nontender, no hepatosplenomegaly, no distention.  Skin: Intact without lesions or rashes.  Neurologic: Alert and oriented x 3.  Psych: Normal affect. Extremities: No clubbing or cyanosis. LUE swelling, unchanged HEENT: Normal.   Stable from cardiac perspective.  Euvolemic.  On abx for MV endocarditis.  Will add low dose Coreg 3.125 mg bid today.   Mass (?hematoma) left axillary area, orthopedics recommending transfer to [facility] for further evaluation of the mass at tertiary center.  INR 2.3, continue warfarin but hold off on heparin gtt.  INR goal 2.5-3.5.   Marca Ancona 01/16/2022

## 2022-01-17 DIAGNOSIS — D689 Coagulation defect, unspecified: Secondary | ICD-10-CM | POA: Diagnosis not present

## 2022-01-17 DIAGNOSIS — D6489 Other specified anemias: Secondary | ICD-10-CM | POA: Diagnosis not present

## 2022-01-17 DIAGNOSIS — R936 Abnormal findings on diagnostic imaging of limbs: Secondary | ICD-10-CM | POA: Diagnosis not present

## 2022-01-17 DIAGNOSIS — L080 Pyoderma: Secondary | ICD-10-CM | POA: Diagnosis not present

## 2022-01-17 DIAGNOSIS — I5043 Acute on chronic combined systolic (congestive) and diastolic (congestive) heart failure: Secondary | ICD-10-CM | POA: Diagnosis not present

## 2022-01-17 DIAGNOSIS — L701 Acne conglobata: Secondary | ICD-10-CM | POA: Diagnosis not present

## 2022-01-17 DIAGNOSIS — J9811 Atelectasis: Secondary | ICD-10-CM | POA: Diagnosis not present

## 2022-01-17 DIAGNOSIS — R2230 Localized swelling, mass and lump, unspecified upper limb: Secondary | ICD-10-CM | POA: Diagnosis not present

## 2022-01-17 DIAGNOSIS — Z992 Dependence on renal dialysis: Secondary | ICD-10-CM | POA: Diagnosis not present

## 2022-01-17 DIAGNOSIS — E861 Hypovolemia: Secondary | ICD-10-CM | POA: Diagnosis not present

## 2022-01-17 DIAGNOSIS — I63411 Cerebral infarction due to embolism of right middle cerebral artery: Secondary | ICD-10-CM | POA: Diagnosis not present

## 2022-01-17 DIAGNOSIS — D6859 Other primary thrombophilia: Secondary | ICD-10-CM | POA: Diagnosis not present

## 2022-01-17 DIAGNOSIS — I5022 Chronic systolic (congestive) heart failure: Secondary | ICD-10-CM | POA: Diagnosis not present

## 2022-01-17 DIAGNOSIS — D638 Anemia in other chronic diseases classified elsewhere: Secondary | ICD-10-CM | POA: Diagnosis not present

## 2022-01-17 DIAGNOSIS — R590 Localized enlarged lymph nodes: Secondary | ICD-10-CM | POA: Diagnosis not present

## 2022-01-17 DIAGNOSIS — I5082 Biventricular heart failure: Secondary | ICD-10-CM | POA: Diagnosis not present

## 2022-01-17 DIAGNOSIS — M79602 Pain in left arm: Secondary | ICD-10-CM | POA: Diagnosis not present

## 2022-01-17 DIAGNOSIS — R579 Shock, unspecified: Secondary | ICD-10-CM | POA: Diagnosis not present

## 2022-01-17 DIAGNOSIS — D649 Anemia, unspecified: Secondary | ICD-10-CM | POA: Diagnosis not present

## 2022-01-17 DIAGNOSIS — R112 Nausea with vomiting, unspecified: Secondary | ICD-10-CM | POA: Diagnosis not present

## 2022-01-17 DIAGNOSIS — R110 Nausea: Secondary | ICD-10-CM | POA: Diagnosis not present

## 2022-01-17 DIAGNOSIS — E871 Hypo-osmolality and hyponatremia: Secondary | ICD-10-CM | POA: Diagnosis not present

## 2022-01-17 DIAGNOSIS — E860 Dehydration: Secondary | ICD-10-CM | POA: Diagnosis not present

## 2022-01-17 DIAGNOSIS — K5900 Constipation, unspecified: Secondary | ICD-10-CM | POA: Diagnosis not present

## 2022-01-17 DIAGNOSIS — K7200 Acute and subacute hepatic failure without coma: Secondary | ICD-10-CM | POA: Diagnosis not present

## 2022-01-17 DIAGNOSIS — I5020 Unspecified systolic (congestive) heart failure: Secondary | ICD-10-CM | POA: Diagnosis not present

## 2022-01-17 DIAGNOSIS — I38 Endocarditis, valve unspecified: Secondary | ICD-10-CM | POA: Diagnosis not present

## 2022-01-17 DIAGNOSIS — R918 Other nonspecific abnormal finding of lung field: Secondary | ICD-10-CM | POA: Diagnosis not present

## 2022-01-17 DIAGNOSIS — Z452 Encounter for adjustment and management of vascular access device: Secondary | ICD-10-CM | POA: Diagnosis not present

## 2022-01-17 DIAGNOSIS — Z20822 Contact with and (suspected) exposure to covid-19: Secondary | ICD-10-CM | POA: Diagnosis not present

## 2022-01-17 DIAGNOSIS — Z952 Presence of prosthetic heart valve: Secondary | ICD-10-CM | POA: Diagnosis not present

## 2022-01-17 DIAGNOSIS — Y831 Surgical operation with implant of artificial internal device as the cause of abnormal reaction of the patient, or of later complication, without mention of misadventure at the time of the procedure: Secondary | ICD-10-CM | POA: Diagnosis present

## 2022-01-17 DIAGNOSIS — Z4901 Encounter for fitting and adjustment of extracorporeal dialysis catheter: Secondary | ICD-10-CM | POA: Diagnosis not present

## 2022-01-17 DIAGNOSIS — I339 Acute and subacute endocarditis, unspecified: Secondary | ICD-10-CM | POA: Diagnosis not present

## 2022-01-17 DIAGNOSIS — Z1152 Encounter for screening for COVID-19: Secondary | ICD-10-CM | POA: Diagnosis not present

## 2022-01-17 DIAGNOSIS — M7989 Other specified soft tissue disorders: Secondary | ICD-10-CM | POA: Diagnosis not present

## 2022-01-17 DIAGNOSIS — D509 Iron deficiency anemia, unspecified: Secondary | ICD-10-CM | POA: Diagnosis not present

## 2022-01-17 DIAGNOSIS — F32A Depression, unspecified: Secondary | ICD-10-CM | POA: Diagnosis not present

## 2022-01-17 DIAGNOSIS — I618 Other nontraumatic intracerebral hemorrhage: Secondary | ICD-10-CM | POA: Diagnosis not present

## 2022-01-17 DIAGNOSIS — L88 Pyoderma gangrenosum: Secondary | ICD-10-CM | POA: Diagnosis not present

## 2022-01-17 DIAGNOSIS — I340 Nonrheumatic mitral (valve) insufficiency: Secondary | ICD-10-CM | POA: Diagnosis not present

## 2022-01-17 DIAGNOSIS — D500 Iron deficiency anemia secondary to blood loss (chronic): Secondary | ICD-10-CM | POA: Diagnosis not present

## 2022-01-17 DIAGNOSIS — M7981 Nontraumatic hematoma of soft tissue: Secondary | ICD-10-CM | POA: Diagnosis not present

## 2022-01-17 DIAGNOSIS — R2232 Localized swelling, mass and lump, left upper limb: Secondary | ICD-10-CM | POA: Diagnosis not present

## 2022-01-17 DIAGNOSIS — I480 Paroxysmal atrial fibrillation: Secondary | ICD-10-CM | POA: Diagnosis not present

## 2022-01-17 DIAGNOSIS — Z9889 Other specified postprocedural states: Secondary | ICD-10-CM | POA: Diagnosis not present

## 2022-01-17 DIAGNOSIS — B351 Tinea unguium: Secondary | ICD-10-CM | POA: Diagnosis not present

## 2022-01-17 DIAGNOSIS — T826XXD Infection and inflammatory reaction due to cardiac valve prosthesis, subsequent encounter: Secondary | ICD-10-CM | POA: Diagnosis not present

## 2022-01-17 DIAGNOSIS — R160 Hepatomegaly, not elsewhere classified: Secondary | ICD-10-CM | POA: Diagnosis not present

## 2022-01-17 DIAGNOSIS — I4891 Unspecified atrial fibrillation: Secondary | ICD-10-CM | POA: Diagnosis not present

## 2022-01-17 DIAGNOSIS — S60222A Contusion of left hand, initial encounter: Secondary | ICD-10-CM | POA: Diagnosis not present

## 2022-01-17 DIAGNOSIS — Z7901 Long term (current) use of anticoagulants: Secondary | ICD-10-CM | POA: Diagnosis not present

## 2022-01-17 DIAGNOSIS — I330 Acute and subacute infective endocarditis: Secondary | ICD-10-CM | POA: Diagnosis not present

## 2022-01-17 DIAGNOSIS — R531 Weakness: Secondary | ICD-10-CM | POA: Diagnosis present

## 2022-01-17 DIAGNOSIS — A419 Sepsis, unspecified organism: Secondary | ICD-10-CM | POA: Diagnosis not present

## 2022-01-17 DIAGNOSIS — N170 Acute kidney failure with tubular necrosis: Secondary | ICD-10-CM | POA: Diagnosis not present

## 2022-01-17 DIAGNOSIS — N179 Acute kidney failure, unspecified: Secondary | ICD-10-CM | POA: Diagnosis not present

## 2022-01-17 DIAGNOSIS — G9341 Metabolic encephalopathy: Secondary | ICD-10-CM | POA: Diagnosis not present

## 2022-01-17 DIAGNOSIS — M25571 Pain in right ankle and joints of right foot: Secondary | ICD-10-CM | POA: Diagnosis not present

## 2022-01-17 DIAGNOSIS — I639 Cerebral infarction, unspecified: Secondary | ICD-10-CM | POA: Diagnosis not present

## 2022-01-17 DIAGNOSIS — N189 Chronic kidney disease, unspecified: Secondary | ICD-10-CM | POA: Diagnosis not present

## 2022-01-17 DIAGNOSIS — D6832 Hemorrhagic disorder due to extrinsic circulating anticoagulants: Secondary | ICD-10-CM | POA: Diagnosis not present

## 2022-01-17 DIAGNOSIS — M79671 Pain in right foot: Secondary | ICD-10-CM | POA: Diagnosis not present

## 2022-01-17 DIAGNOSIS — R570 Cardiogenic shock: Secondary | ICD-10-CM | POA: Diagnosis not present

## 2022-01-17 DIAGNOSIS — I6389 Other cerebral infarction: Secondary | ICD-10-CM | POA: Diagnosis not present

## 2022-01-17 DIAGNOSIS — I63511 Cerebral infarction due to unspecified occlusion or stenosis of right middle cerebral artery: Secondary | ICD-10-CM | POA: Diagnosis not present

## 2022-01-17 DIAGNOSIS — I5023 Acute on chronic systolic (congestive) heart failure: Secondary | ICD-10-CM | POA: Diagnosis not present

## 2022-01-17 DIAGNOSIS — I4892 Unspecified atrial flutter: Secondary | ICD-10-CM | POA: Diagnosis not present

## 2022-01-17 DIAGNOSIS — E8809 Other disorders of plasma-protein metabolism, not elsewhere classified: Secondary | ICD-10-CM | POA: Diagnosis not present

## 2022-01-17 DIAGNOSIS — K761 Chronic passive congestion of liver: Secondary | ICD-10-CM | POA: Diagnosis not present

## 2022-01-17 DIAGNOSIS — L98411 Non-pressure chronic ulcer of buttock limited to breakdown of skin: Secondary | ICD-10-CM | POA: Diagnosis not present

## 2022-01-17 DIAGNOSIS — T826XXA Infection and inflammatory reaction due to cardiac valve prosthesis, initial encounter: Secondary | ICD-10-CM | POA: Diagnosis not present

## 2022-01-17 DIAGNOSIS — J811 Chronic pulmonary edema: Secondary | ICD-10-CM | POA: Diagnosis not present

## 2022-01-17 DIAGNOSIS — I959 Hypotension, unspecified: Secondary | ICD-10-CM | POA: Diagnosis not present

## 2022-01-17 DIAGNOSIS — R6521 Severe sepsis with septic shock: Secondary | ICD-10-CM | POA: Diagnosis not present

## 2022-01-17 DIAGNOSIS — L732 Hidradenitis suppurativa: Secondary | ICD-10-CM | POA: Diagnosis not present

## 2022-01-17 DIAGNOSIS — J9601 Acute respiratory failure with hypoxia: Secondary | ICD-10-CM | POA: Diagnosis not present

## 2022-01-17 LAB — BASIC METABOLIC PANEL
Anion gap: 5 (ref 5–15)
BUN: 17 mg/dL (ref 6–20)
CO2: 25 mmol/L (ref 22–32)
Calcium: 8.8 mg/dL — ABNORMAL LOW (ref 8.9–10.3)
Chloride: 104 mmol/L (ref 98–111)
Creatinine, Ser: 0.69 mg/dL (ref 0.61–1.24)
GFR, Estimated: >60 mL/min (ref >60)
Glucose, Bld: 85 mg/dL (ref 70–99)
Potassium: 4 mmol/L (ref 3.5–5.1)
Sodium: 134 mmol/L — ABNORMAL LOW (ref 135–145)

## 2022-01-17 LAB — VANCOMYCIN, TROUGH: Vancomycin Tr: 8 ug/mL — ABNORMAL LOW (ref 15–20)

## 2022-01-17 LAB — PROTIME-INR
INR: 2.4 — ABNORMAL HIGH (ref 0.8–1.2)
Prothrombin Time: 25.7 seconds — ABNORMAL HIGH (ref 11.4–15.2)

## 2022-01-17 LAB — CBC
HCT: 25.7 % — ABNORMAL LOW (ref 39.0–52.0)
Hemoglobin: 8.2 g/dL — ABNORMAL LOW (ref 13.0–17.0)
MCH: 25.8 pg — ABNORMAL LOW (ref 26.0–34.0)
MCHC: 31.9 g/dL (ref 30.0–36.0)
MCV: 80.8 fL (ref 80.0–100.0)
Platelets: 339 10*3/uL (ref 150–400)
RBC: 3.18 MIL/uL — ABNORMAL LOW (ref 4.22–5.81)
RDW: 17.2 % — ABNORMAL HIGH (ref 11.5–15.5)
WBC: 7 10*3/uL (ref 4.0–10.5)
nRBC: 0 % (ref 0.0–0.2)

## 2022-01-17 LAB — VANCOMYCIN, PEAK: Vancomycin Pk: 21 ug/mL — ABNORMAL LOW (ref 30–40)

## 2022-01-17 LAB — MAGNESIUM: Magnesium: 1.7 mg/dL (ref 1.7–2.4)

## 2022-01-17 MED ORDER — SACUBITRIL-VALSARTAN 49-51 MG PO TABS: 1.0000 | ORAL_TABLET | Freq: Two times a day (BID) | ORAL | Status: DC

## 2022-01-17 MED ORDER — WARFARIN SODIUM 4 MG PO TABS
4.0000 mg | ORAL_TABLET | Freq: Once | ORAL | Status: AC
  Administered 2022-01-17: 4 mg via ORAL
  Filled 2022-01-17: qty 1

## 2022-01-17 MED ORDER — VANCOMYCIN IV (FOR PTA / DISCHARGE USE ONLY): 1000.0000 mg | Freq: Two times a day (BID) | INTRAVENOUS | 0 refills | Status: AC

## 2022-01-17 MED ORDER — ROSUVASTATIN CALCIUM 40 MG PO TABS: 40.0000 mg | ORAL_TABLET | Freq: Every day | ORAL | Status: DC

## 2022-01-17 MED ORDER — VANCOMYCIN HCL IN DEXTROSE 1-5 GM/200ML-% IV SOLN
1000.0000 mg | Freq: Two times a day (BID) | INTRAVENOUS | Status: DC
  Administered 2022-01-17: 1000 mg via INTRAVENOUS
  Filled 2022-01-17 (×2): qty 200

## 2022-01-17 MED ORDER — CARVEDILOL 3.125 MG PO TABS: 3.1250 mg | ORAL_TABLET | Freq: Two times a day (BID) | ORAL | Status: DC

## 2022-01-17 MED ORDER — MAGNESIUM SULFATE 2 GM/50ML IV SOLN
2.0000 g | Freq: Once | INTRAVENOUS | Status: AC
Start: 2022-01-17 — End: 2022-01-17
  Administered 2022-01-17: 2 g via INTRAVENOUS
  Filled 2022-01-17: qty 50

## 2022-01-17 MED ORDER — POLYETHYLENE GLYCOL 3350 17 G PO PACK: 17.0000 g | PACK | Freq: Two times a day (BID) | ORAL | 0 refills | Status: DC

## 2022-01-17 MED ORDER — SENNA 8.6 MG PO TABS: 2.0000 | ORAL_TABLET | Freq: Two times a day (BID) | ORAL | 0 refills | Status: DC

## 2022-01-17 MED ORDER — SACUBITRIL-VALSARTAN 49-51 MG PO TABS
1.0000 | ORAL_TABLET | Freq: Two times a day (BID) | ORAL | Status: DC
  Administered 2022-01-17: 1 via ORAL
  Filled 2022-01-17: qty 1

## 2022-01-17 MED ORDER — VANCOMYCIN HCL IN DEXTROSE 1-5 GM/200ML-% IV SOLN: 1000.0000 mg | Freq: Two times a day (BID) | INTRAVENOUS | Status: DC

## 2022-01-17 MED ORDER — FUROSEMIDE 40 MG PO TABS: 40.0000 mg | ORAL_TABLET | Freq: Every day | ORAL | Status: DC

## 2022-01-17 MED ORDER — MAGNESIUM SULFATE 2 GM/50ML IV SOLN
2.0000 g | Freq: Once | INTRAVENOUS | Status: AC
  Administered 2022-01-17: 2 g via INTRAVENOUS
  Filled 2022-01-17: qty 50

## 2022-01-17 MED ORDER — OXYCODONE HCL 5 MG PO TABS: 5.0000 mg | ORAL_TABLET | ORAL | 0 refills | Status: DC | PRN

## 2022-01-17 MED ORDER — SPIRONOLACTONE 25 MG PO TABS: 25.0000 mg | ORAL_TABLET | Freq: Every day | ORAL | Status: DC

## 2022-01-17 MED ORDER — FERROUS SULFATE 325 (65 FE) MG PO TABS: 325.0000 mg | ORAL_TABLET | ORAL | 3 refills | Status: DC

## 2022-01-17 NOTE — Progress Notes (Signed)
Post discharge note:  2003 8/4 Nastassja RN at [facility] called for report between this RN and RN Lazaro Arms all questions were answered.

## 2022-01-17 NOTE — Progress Notes (Signed)
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 To discharging provider:  please sign these orders via discharge navigator,  Select New Orders & click on the button choice - Manage This Unsigned Work.     Thank you for allowing pharmacy to be a part of this patient's care.   Fredonia Highland, PharmD, BCPS, [facility] Clinical Pharmacist [phone] Please check AMION for all [facility] numbers 01/17/2022

## 2022-01-17 NOTE — Progress Notes (Signed)
Pharmacy Antibiotic Note  Donald [name] is a 36 y.o. 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   Cr has remained stable. Vancomycin levels today show a subtherapeutic AUC of 395.   Plan: Increase Vancomycin to 1000mg  IV every 12 hours  Cefepime 2g IV q8h   Height: 5\' 10"  (177.8 cm) Weight: 89.7 kg (197 lb 12 oz) IBW/kg (Calculated) : 73  Temp (24hrs), Avg:98 F (36.7 C), Min:97.7 F (36.5 C), Max:98.2 F (36.8 C)  Recent Labs  Lab 01/12/22 0125 01/12/22 1200 01/13/22 0430 01/14/22 0419 01/15/22 0838 01/16/22 0520 01/17/22 0157 01/17/22 0223  WBC 8.3  --  8.9 8.8 8.5 6.9 7.0  --   CREATININE 0.82  --  0.72 0.62 0.67 0.63 0.69  --   VANCOTROUGH  --  9*  --   --   --   --   --   --   VANCOPEAK 28*  --   --   --   --   --   --  21*     Estimated Creatinine Clearance: 148.1 mL/min (by C-G formula based on SCr of 0.69 mg/dL).    No Known Allergies  Antimicrobials this admission: Cefepime 7/14 >> (8/24) Vancomycin 7/14 >> (8/24) Metronidazole 7/14 >> 7/14  Microbiology results: 7/15 BCx: negative 7/16 TA: negative 7/16 BCx: negative 7/31 L Arm Abscess: negative   8/16, PharmD, BCPS, [facility] Clinical Pharmacist [phone] Please check AMION for all [facility] numbers 01/17/2022

## 2022-01-17 NOTE — Progress Notes (Signed)
Pt LUE measured at 18 inches at widest circumference.

## 2022-01-17 NOTE — TOC CM/SW Note (Signed)
HF TOC CM updated [facility] that pt is scheduled transfer to [facility]. Isidoro Donning RN3 CCM, Heart Failure TOC CM [phone]

## 2022-01-17 NOTE — Progress Notes (Addendum)
Daily Progress Note Intern Pager: [phone]  Patient name: Donald [name] Medical record number: [id number] Date of birth: [birth year] Age: 36 y.o. Gender: male  Primary Care Provider: No primary care provider on file. Consultants: pharmacy, cardiology, orthopedic surgery, IR Code Status: FULL CODE  Pt Overview and Major Events to Date:  -714 admitted FPTS for sepsis, N/V. Started on flagyl, cefepime, vanc -7/15 worsening shock> ICU> intubated, pressors, abx. Flagyl stopped -7/16 decreasing milrinone for high CO and co-ox -7/19 extubated -7/21 drop in cardiac index, Levophed restarted, Swan discontinued -7/22 levophed stopped -7/23 milrinone stopped -7/24 out to [facility]MT[facility] -7/27 acute stroke  and swelling of L upper arm, CT w/ pyomyositis -7/31 aspiration of L upper arm completed (GS negative, cultures pending) -8/2 LUE pyomyositis + hemorrhagic component vs large hematoma -8/3 per orthopedic surgery, needs transfer to tertiary care center (on [facility] waitlist)  Assessment and Plan:  Donald [name] is a 36 y.o. with PMHx mitral regurgitation s/p mitral valve mechanical valve, biventricular heart failure, and paroxysmal atrial fibrillation who presented with n/v/d and found to have prosthetic MV endocarditis, stay complicated by pyomyositis + hematoma of L arm c/f soft tissue malignancy, awaiting transfer to [facility]  * Endocarditis of prosthetic mitral valve (HCC) Stable, afebrile. INR 2.4 (8/4) - Continue vancomycin and cefepime through PICC.  Patient is set up to go home with home antibiotic infusions. End date 8/27. -on warfarin  Left arm pain and swelling CT with contrast concerning for cellulitis and pyomyositis of left axilla extending to coracobrachialis.  Surgery consulted, attempted bedside aspiration without success, decline further surgical management at this time given high risk on anticoagulation. IR following, aspiration completed, GS negative, no organisms,  cultures pending -F/u IR recs, INR -Continue broad spectrum abx as above, discussed regimen with ID 7/30 - LUE decreased ROM, pulse, and sensation - MRI humerus with IV contrast: cellulitis and pyomyositis + hemorrhagic component vs large hematoma c/f soft-tissue malignancy - planned transfer to [facility] for orthopedic oncology  Acute embolic stroke (HCC) Stable.  CTA head/neck negative for aneurysm, so stroke team recommended to continue current antithrombotics. - continue rosuvastatin 40 mg daily -Stroke team signed off, plan to follow-up with stroke clinic NP in [facility] in about 4 weeks.  Paroxysmal atrial fibrillation (HCC) Stable, rate controlled. -Continue amiodarone 200 mg daily -Cardiology following, appreciate conditions.   Chronic heart failure (HCC) Stable at this time. - STARTED carvedilol (Coreg) 3.125 mg - continuefurosemide 40 mg - continue sacubitril-valsartan [name]) - continue spironolactone 25 mg - Continue digoxin 0.125 mg and Farxiga 10 mg - Appreciate heart failure team care and recommendations   Iron deficiency anemia Stable, chronic.  Hemoglobin near baseline.  Continue to monitor. - start iron supplementation (ferrous sulfate 325 mg, every other day)  Prolonged QT interval Last QTc 495 on 7/29  Hypomagnesemia Mg 1.7 (8/4) -4 Mg IV -AM Mg  AKI (acute kidney injury) (HCC)-resolved as of 01/07/2022 Resolved with improving shock. -Continue fluids, PO as tolerated -AM BMP  Shock, septic and cardiogenic-resolved as of 01/11/2022 Resolved since out of ICU. On vanc, cefepime, midodrine. -Monitor VS -Continue abx per endocarditis  Gastroenteritis-resolved as of 01/11/2022    FEN/GI: senna 2 tablets BID, PEG BID PPx: warfarin Dispo: transfer to [facility]  Barriers include bed availability.   Subjective:  Patient endorses good sleep, good appetite. He finally had a bowel movement last night. Was concerned about PICC  care, as he was previously taught but forgot. Reassured  him that wherever he goes (to [facility] or another facility), he could always ask to be taught how to care for his PICC again. Patient says he feels L forearm is more swollen today, he also says he feels some pins-and needles-sensation on L hand  Objective: Temp:  [97.7 F (36.5 C)-98.2 F (36.8 C)] 98.2 F (36.8 C) (08/04 0811) Pulse Rate:  [64-71] 64 (08/04 0811) Resp:  [14-18] 16 (08/04 0811) BP: (123-133)/(67-77) 133/73 (08/04 0811) SpO2:  [98 %-100 %] 100 % (08/04 0811) Weight:  [197 lb 12 oz (89.7 kg)] 197 lb 12 oz (89.7 kg) (08/04 0347) Physical Exam: General: well appearing, in NAD, laying in bed Cardiovascular: regular rate Respiratory: CTAB anteriorly, normal breathing effort on RA Abdomen: nontender, nondistended Extremities: Unable to spontaneously move LUE, swollen around bicep. LUE warm, +1 pulses, mild tenderness to palpation on lower arm, but tender on passive ROM. Unchanged from yesterday  Laboratory: Most recent CBC Lab Results  Component Value Date   WBC 7.0 01/17/2022   HGB 8.2 (L) 01/17/2022   HCT 25.7 (L) 01/17/2022   MCV 80.8 01/17/2022   PLT 339 01/17/2022   Most recent BMP    Latest Ref Rng & Units 01/17/2022    1:57 AM  BMP  Glucose 70 - 99 mg/dL 85   BUN 6 - 20 mg/dL 17   Creatinine 4.01 - 1.24 mg/dL 0.27   Sodium 253 - 664 mmol/L 134   Potassium 3.5 - 5.1 mmol/L 4.0   Chloride 98 - 111 mmol/L 104   CO2 22 - 32 mmol/L 25   Calcium 8.9 - 10.3 mg/dL 8.8     Other pertinent labs  Mg 1.7  INR 2.4  [name], MD 01/17/2022, 11:56 AM  PGY-1, [facility] FPTS Intern pager: [phone], text pages welcome Secure chat group [facility] Teaching Service

## 2022-01-17 NOTE — Progress Notes (Signed)
Report given to [facility].  Attempted to call report to receiving floor at [facility].  Abandoned call after being on hold 8 mins.  Will try again once pt is en route

## 2022-01-17 NOTE — TOC CM/SW Note (Signed)
HF TOC CM contacted [facility] [phone]. Spoke to [facility] and COVID testing is not needed prior to transfer. Pt remains on waitlist, accepting MD is Dr Audree Bane. The transfer center will contact the 4e unit when they have a bed available. Updated the Unit RN. Will need to complete EMTALA and arrange with [facility] for transport.   Isidoro Donning RN3 CCM, Heart Failure TOC CM [phone]

## 2022-01-17 NOTE — Progress Notes (Signed)
CARDIAC REHAB PHASE I   Offered to walk with pt. Pt continues to decline. Pt states nausea when ambulating to BR. Provided support and encouragement. Will sign off.  [phone] Reynold Bowen, RN BSN 01/17/2022 2:36 PM

## 2022-01-17 NOTE — Progress Notes (Signed)
PT Cancellation Note  Patient Details Name: Donald [name] MRN: [id number] DOB: [birth year]   Cancelled Treatment:    Reason Eval/Treat Not Completed: Patient declined, no reason specified, pt declining all mobility despite encouragement. Will check back as schedule allows to continue with PT POC.  Lenora Boys. PTA Acute Rehabilitation Services Office: [phone]     Catalina Antigua 01/17/2022, 11:15 AM

## 2022-01-17 NOTE — Progress Notes (Addendum)
Occupational Therapy Treatment Patient Details Name: Donald [name] MRN: [id number] DOB: 09/21/1985 Today's Date: 01/17/2022   History of present illness Pt is a 36 y.o. 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 Transfer to ICU 7/15 with cardiogenic and septic shock; ETT 7/15-7/19. 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   OT comments  Patient goals and POC discussed and reviewed with COTA, and patient.  Patient continues to express pain to left shoulder complex and upper arm.  MRI Humerus: Extensive soft tissue swelling, intramuscular and superficial along the left upper extremity including the axilla, supra/infraclavicular upper chest, along the pectoralis musculature, deltoid, and distally beyond the elbow into the proximal forearm.  Findings are suggestive of cellulitis and pyomyositis.  OT can initiate trial of pendulums/codmens to L shoulder, in stand, trying to encourage forward lean at the waist to maximize shoulder flexion, AA left elbow with patient using his right hand as stabilize assist, supination/pronation to tolerance, and fist pumps with wrist AROM.  A sling can be used to support the shoulder during mobility, and the patient is encouraged to introduce desensitization techniques via rubbing the arm to reduce complications from a pain syndrome.  Progression of his HEP should begin with supine shoulder flexion/extension, AA from therapist, to help approximate the shoulder complex prior to ROM. Positioning of the arm in supine with pillows was discussed, and his father was present to help reinforce.  No significant changes to his goals were introduced, and OT will continue to follow in  the acute setting.  Currently he plans on discharging to [facility], so discharge recommendation updated.     Recommendations for follow up therapy are one component of a multi-disciplinary discharge planning process, led by the attending physician.  Recommendations may be updated based on patient status, additional functional criteria and insurance authorization.    Follow Up Recommendations  Follow physician's recommendations for discharge plan and follow up therapies    Assistance Recommended at Discharge Intermittent Supervision/Assistance  Patient can return home with the following  A little help with walking and/or transfers;Assistance with cooking/housework;Help with stairs or ramp for entrance;Assist for transportation;Direct supervision/assist for financial management;Direct supervision/assist for medications management;A little help with bathing/dressing/bathroom   Equipment Recommendations  None recommended by OT    Recommendations for Other Services      Precautions / Restrictions Precautions Precautions: Fall Precaution Comments: sacral wounds Restrictions Weight Bearing Restrictions: No       Mobility Bed Mobility               General bed mobility comments: at EOB heading to restroom    Transfers Overall transfer level: Needs assistance Equipment used: None Transfers: Sit to/from Stand Sit to Stand: Supervision                 Balance   Sitting-balance support: No upper extremity supported Sitting balance-Leahy Scale: Good     Standing balance support: No upper extremity supported Standing balance-Leahy Scale: Fair Standing balance comment: reaching for objects in his environment.                           ADL either performed or assessed with clinical judgement   ADL  Grooming: Therapist, nutritional;Set up           Upper Body Dressing : Minimal assistance;Sitting Upper Body Dressing Details (indicate cue type  and reason): cues to start with LUE Lower Body Dressing: Moderate assistance;Sit to/from stand   Toilet Transfer: Min guard;Ambulation;Regular Toilet                  Extremity/Trunk Assessment Upper Extremity Assessment Upper Extremity Assessment: LUE deficits/detail LUE Deficits / Details: Painful ROM to shoulder complex and upper arm. Patient is ok with hand, wrist and half range to forearm as it begins to incorporate biceps. LUE Sensation: WNL       Cervical / Trunk Assessment Cervical / Trunk Assessment: Normal                                                                                         Pertinent Vitals/ Pain       Pain Assessment Pain Score: 8  Pain Location: LUE with movement Pain Descriptors / Indicators: Shooting, Heaviness, Grimacing, Guarding Pain Intervention(s): Monitored during session                                                          Frequency  Min 2X/week        Progress Toward Goals  OT Goals(current goals can now be found in the care plan section)  Progress towards OT goals: Progressing toward goals  Acute Rehab OT Goals OT Goal Formulation: With patient Time For Goal Achievement: 01/31/22 Potential to Achieve Goals: Fair ADL Goals Pt/caregiver will Perform Home Exercise Program: Increased ROM;Increased strength;Both right and left upper extremity;With Supervision  Plan Discharge plan remains appropriate    Co-evaluation                 AM-PAC OT "6 Clicks" Daily Activity     Outcome Measure   Help from another person eating meals?: None Help from another person taking care of personal grooming?: A Little Help from another person toileting, which includes using toliet, bedpan, or urinal?: A Little Help from another person bathing (including washing, rinsing, drying)?: A Lot Help from another person to put on and taking off regular upper body  clothing?: A Little Help from another person to put on and taking off regular lower body clothing?: A Lot 6 Click Score: 17    End of Session    OT Visit Diagnosis: Unsteadiness on feet (R26.81);Muscle weakness (generalized) (M62.81);Other symptoms and signs involving cognitive function   Activity Tolerance Patient limited by pain   Patient Left Other (comment) (on toilet with NT in room for ADL sinkside)   Nurse Communication          Time: 1443-1500 OT Time Calculation (min): 17 min  Charges: OT General Charges $OT Visit: 1 Visit OT Treatments $Self Care/Home Management : 8-22 mins  01/17/2022  RP, OTR/L  Acute Rehabilitation Services  Office:  [phone]   Suzanna Obey 01/17/2022, 3:19 PM

## 2022-01-17 NOTE — Progress Notes (Addendum)
Advanced Heart Failure Rounding Note   Subjective:   7/19 Extubated. Diuresed with IV lasix. I/O not accurate. Norepi weaned off.  7/20 Swan out  7/23 Milrinone off 7/27 Acute LUE weakness/swelling. CODE Stroke. MRI w/ several small acute right MCA territory ischemic strokes, suggestive of a cardioembolic etiology. UE CT with contrast concerning for cellulitis and pyomyositis of left axilla 7/31 s/p  IR aspiration of 100 cc thin bloody fluid from left axilla. Cx NGTD. Midodrine discontinued w/ elevated BPs.  8/2: MRI humerus with cellulitis and pyomyositis + hemorrhagic component vs large hematoma. 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 Planning for transfer for [facility].   Remains on On vanc/cefepime for pMVR endocarditis. Has PICC for continuation of home IV abx.    No dyspnea, orthopnea or PND. Left upper extremity more swollen, notes decreased strength and sensation from elbow to hand.     LUE MRI 8/2 IMPRESSION: Large heterogeneous intramuscular collection in the anterior compartment of the left upper extremity, measuring up to 7.2 x 5.7 axially and 18.0 cm in craniocaudal extent. Extensive associated soft tissue edema throughout the left upper extremity extending to the axilla and lower neck, and distally beyond the elbow into the forearm. Marked skin thickening medially. Findings are suggestive of cellulitis and pyomyositis. Intrinsic T1 signal and blooming artifact suggests the presence of a hemorrhagic component, and this collection could also simply be a large hematoma.   No evidence of osteomyelitis or acute fracture.   Objective:     Vital Signs:   Temp:  [97.7 F (36.5 C)-98.2 F (36.8 C)] 98.2 F (36.8 C) (08/04 0811) Pulse Rate:  [64-71] 64 (08/04 0811) Resp:  [14-18] 16 (08/04 0811) BP: (123-133)/(67-77) 133/73 (08/04 0811) SpO2:   [98 %-100 %] 100 % (08/04 0811) Weight:  [89.7 kg] 89.7 kg (08/04 0347) Last BM Date : 01/16/22  Weight change: Filed Weights   01/15/22 0336 01/16/22 0500 01/17/22 0347  Weight: 90.2 kg 90.6 kg 89.7 kg    Intake/Output:   Intake/Output Summary (Last 24 hours) at 01/17/2022 0920 Last data filed at 01/17/2022 0272 Gross per 24 hour  Intake 1977.04 ml  Output 600 ml  Net 1377.04 ml   PHYSICAL EXAM: General:  Sitting up in bed. No distress. HEENT: normal Neck: supple. no JVD. Carotids 2+ bilat; no bruits. No lymphadenopathy or thryomegaly appreciated. Cor: PMI nondisplaced. Regular rate & rhythm. No rubs, gallops or murmurs. Lungs: clear Abdomen: soft, nontender, nondistended.  Extremities: no cyanosis, clubbing, rash, + LUE swelling, LUE tender to palpation  Neuro: alert & orientedx3. Difficulty moving LUE . Affect pleasant     Telemetry: SR 60s (personally reviewed)   Labs: Basic Metabolic Panel: Recent Labs  Lab 01/13/22 0430 01/14/22 0419 01/15/22 0838 01/16/22 0520 01/17/22 0157  NA 131* 131* 131* 132* 134*  K 3.8 3.9 4.0 3.5 4.0  CL 101 101 101 101 104  CO2 23 24 23 25 25   GLUCOSE 90 101* 97 90 85  BUN 15 14 11 15 17   CREATININE 0.72 0.62 0.67 0.63 0.69  CALCIUM 8.9 8.8* 8.8* 8.7* 8.8*  MG 1.3* 1.5* 1.5* 1.8 1.7    Liver Function Tests: Recent Labs  Lab 01/16/22 0520  AST 92*  ALT 60*  ALKPHOS 119  BILITOT 0.9  PROT 8.0  ALBUMIN 2.1*    No results for input(s): "LIPASE", "AMYLASE" in the last 168 hours. No results  for input(s): "AMMONIA" in the last 168 hours.   CBC: Recent Labs  Lab 01/13/22 0430 01/14/22 0419 01/15/22 0838 01/16/22 0520 01/17/22 0157  WBC 8.9 8.8 8.5 6.9 7.0  HGB 8.8* 8.5* 8.4* 8.4* 8.2*  HCT 27.1* 27.0* 26.8* 26.3* 25.7*  MCV 81.1 82.3 81.7 80.9 80.8  PLT 347 360 328 349 339    Cardiac Enzymes: No results for input(s): "CKTOTAL", "CKMB", "CKMBINDEX", "TROPONINI" in the last 168 hours.   BNP: BNP (last 3  results) Recent Labs    07/24/21 1433 10/11/21 1230 12/27/21 1451  BNP 3,184.1* 400.8* >4,500.0*    ProBNP (last 3 results) No results for input(s): "PROBNP" in the last 8760 hours.    Other results:  Imaging: No results found.   Medications:     Scheduled Medications:  acetaminophen  650 mg Oral Q6H   amiodarone  200 mg Oral Daily   aspirin  81 mg Oral Daily   carvedilol  3.125 mg Oral BID WC   Chlorhexidine Gluconate Cloth  6 each Topical Daily   dapagliflozin propanediol  10 mg Oral Daily   digoxin  0.125 mg Oral Daily   feeding supplement  237 mL Oral TID BM   ferrous sulfate  325 mg Oral QODAY   furosemide  40 mg Oral Daily   lactulose  10 g Oral BID   polyethylene glycol  17 g Oral BID   rosuvastatin  40 mg Oral Daily   sacubitril-valsartan  1 tablet Oral BID   senna  2 tablet Oral BID   sodium chloride flush  10-40 mL Intracatheter Q12H   spironolactone  25 mg Oral Daily   warfarin  4 mg Oral ONCE-1600   Warfarin - Pharmacist Dosing Inpatient   Does not apply q1600    Infusions:  sodium chloride 10 mL/hr at 01/17/22 7425   sodium chloride 10 mL/hr at 01/17/22 9563   ceFEPime (MAXIPIME) IV Stopped (01/17/22 8756)   vancomycin Stopped (01/16/22 2344)    PRN Medications: sodium chloride, sodium chloride, ipratropium-albuterol, mouth rinse, oxyCODONE, sodium chloride flush   Assessment/Plan:   1. Mixed cardiogenic/septic shock - echo 12/28/21 LVEF 10% RV sev HK - TEE 7/15 severe biventricular failure. + vegetation on mechanical MVR - Bcx - NGTD - Respiratory culture w/ moderate WBCs (mononuclear and PMN) and no organisms  - Cultures no growth.  - ID following. Continue vanc/cefepime. Now AF - Off inotropes/pressors.   2. 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 Repeat echo as above.  - Echo this admit LVEF 10% RV  severely HK.  - Off milrinone and NE. - Euvolemic on exam. No dyspnea  - Increase Entresto to 49/51 mg BID. - Continue Coreg 3.125 mg BID - Continue spiro 25 mg daily  - Continue Farxiga 10 mg daily - Continue digoxin 0.125, dig level 0.5. - Long-term only option is transplant. Dr. 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 They will discuss.    3. 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  - Underwent MVR with mechanical mitral valve in 05/2021 at [facility].  - TEE 7/15 severe biventricular failure. + vegetation on mechanical MVR - Bcx NGTD. -> on  vanc/cefepime. 6 weeks IV abx, end date 08/24.  - Has PICC for home abx. Will update home infusion rep regarding impending transfer to [facility]. - ID following. Appreciate assistance. - INR 2.4  (goal 2.5-3.5). Coumadin per pharmacy     4. Acute hypoxic respiratory failure - intubated 7/15 in setting of shock - Extubated 7/19  - O2 stable on RA. Resolved.  5. Paroxysmal Atrial Fibrillation - In SR  - does not tolerate AF - liver enzymes continue to improve. - back on amio 200 daily  5. AKI - Due to ATN/shock - Baseline creatinine 0.98 - 1.0.  - Peak 2.6. Resolved.   6. Shock liver - continue supportive care - AST /ALT continue trend down.   7. Autoimmune Disorder  - He has seronegative spondylitis and pyoderma gangrenosum. Had been on Humira. - Followed by Rheumatology as an outpatient.   8. Hypomagnesemia/hypokalemia - Mag persistently low despite supplementation throughout admit.  - Mag 1.7 this am. Receiving 2 gm IV. - K 4.0 - Continue spiro   9. Deconditioning - PT/OT following. Will need outpatient PT/OT post stroke. - Continue to mobilize.   10. Multiple small acute right MCA territory  ischemic strokes - LUE weakness  - likely embolic from endocarditis  - coumadin + ASA + statin per neuro   11. LUE cellulitis and pyomyositis of left axilla  - per GS, too high risk for I&D under general anesthesia - s/p IR aspiration of 100 cc thin bloody fluid from left axilla 7/31. Cx NGTD. - minimal improvement post aspiration. C/w pain and swelling. Decreased sensation and ROM. - LUE MRI 8/2 findings are suggestive of cellulitis and pyomyositis. Intrinsic T1 signal and blooming artifact suggests the presence of a hemorrhagic component, and this collection could also simply be a large hematoma.  - Seen by Orthopedic Surgery. Etiology of  mass uncertain. Pending transfer to [facility] for evaluation by Orthopaedic Oncology.      Length of Stay: 20  FINCH, LINDSAY N, PA-C  9:20 AM   01/17/2022, 9:20 AM  Advanced Heart Failure Team Pager [phone] (M-F; 7a - 4p)  Please contact [facility] for night-coverage after hours (4p -7a ) and weekends on amion.  Patient seen with PA, agree with the above note.   Stable from cardiac perspective, creatinine 0.6 with SBP 130s.  No dyspnea.    Still with pain/swelling LUE.   General: NAD Neck: No JVD, no thyromegaly or thyroid nodule.  Lungs: Clear to auscultation bilaterally with normal respiratory effort. CV: Nondisplaced PMI.  Heart regular S1/S2, no S3/S4, no murmur.  No peripheral edema.  No carotid bruit.  Normal pedal pulses.  Abdomen: Soft, nontender, no hepatosplenomegaly, no distention.  Skin: Intact without lesions or rashes.  Neurologic: Alert and oriented x 3.  Psych: Normal affect. Extremities: No clubbing or cyanosis. LUE edema/swelling.  HEENT: Normal.   Agree with plan to increase Entresto to 49/51 bid, keep other cardiac meds stable.   Continue abx for prosthetic MV endocarditis.   INR 2.4, continue warfarin without heparin gtt.   Pending transfer to [facility] for evaluation of left upper extremity mass by ortho  oncology.   Marca Ancona 01/17/2022 9:56 AM

## 2022-01-17 NOTE — Progress Notes (Signed)
The patient wants to know if he will be transferred back to [facility] or go home after he's done at [facility].  States he needs to relearn how to care for his PICC line.

## 2022-01-17 NOTE — Progress Notes (Signed)
ANTICOAGULATION CONSULT NOTE  Pharmacy Consult for warfarin Indication:  MVR /AFib  No Known Allergies  Patient Measurements: Height: 5\' 10"  (177.8 cm) Weight: 89.7 kg (197 lb 12 oz) IBW/kg (Calculated) : 73 Heparin Dosing Weight: 90.7 kg  Vital Signs: Temp: 98.1 F (36.7 C) (08/04 0347) Temp Source: Oral (08/04 0347) BP: 133/75 (08/04 0347) Pulse Rate: 70 (08/04 0347)  Labs: Recent Labs    01/14/22 2218 01/15/22 03/17/22 01/15/22 0838 01/16/22 0520 01/17/22 0157  HGB  --  8.4*   < > 8.4* 8.2*  HCT  --  26.8*  --  26.3* 25.7*  PLT  --  328  --  349 339  LABPROT  --  25.2*  --  24.9* 25.7*  INR  --  2.3*  --  2.3* 2.4*  HEPARINUNFRC 0.26* 0.Donald  --   --   --   CREATININE  --  0.67  --  0.63 0.69   < > = values in this interval not displayed.     Estimated Creatinine Clearance: 148.1 mL/min (by C-G formula based on SCr of 0.69 mg/dL).   Medical History: Past Medical History:  Diagnosis Date   Anemia    Autoimmune disorder (HCC)    pyoderma gangrenosum   CHF (congestive heart failure) (HCC)    Chronic systolic heart failure (HCC)    a. EF 35-40% by echo in 07/2018 b. EF at 45% by repeat echo in 04/2020   DVT (deep venous thrombosis) (HCC)    h/o   Dysrhythmia    Mitral regurgitation    a. 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 b. 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HCC)    arthritis   Shock, septic and cardiogenic 12/28/2021   Tricuspid regurgitation    Assessment: Donald [name] with a history of mitral regurgitation s/p mechanical valve replacement on Warfarin, HF, AF and prior DVT.   Patient taking warfarin PTA 4mg  daily per last outpatient [facility] note.  Warfarin held briefly with L arm swelling requiring aspiration in IR 7/31, now  resume. Holding off on IV heparin drip with concerns for arm hematoma unless INR drops <2. INR today is 2.4 and trending up, CBC stable.   Goal of Therapy:  INR goal 2.5-3.5 Monitor platelets by anticoagulation protocol: Yes   Plan:  Warfarin 4mg  PO x1 tonight Daily INR  [facility], PharmD, BCPS, [facility] Clinical Pharmacist [phone] Please check AMION for all [facility] numbers 01/17/2022

## 2022-01-17 NOTE — Progress Notes (Signed)
OT Cancellation Note  Patient Details Name: Donald [name] MRN: [id number] DOB: 1985-06-22   Cancelled Treatment:    Reason Eval/Treat Not Completed: Patient declined, no reason specified (Patient recieved in supine and stating he did not want to do much today due to not feeling well. Attempted to encourage patient to get up to sink later but declined.) Alfonse Flavors, OTA Acute Rehabilitation Services  Office [phone]  Donald [name] 01/17/2022, 12:27 PM

## 2022-01-18 LAB — AEROBIC/ANAEROBIC CULTURE W GRAM STAIN (SURGICAL/DEEP WOUND)
Culture: NO GROWTH
Gram Stain: NONE SEEN

## 2022-01-24 ENCOUNTER — Telehealth: Payer: Self-pay | Admitting: Cardiology

## 2022-01-24 NOTE — Telephone Encounter (Signed)
Caller noted patient has been set up with home health service.  Caller wants to know if it will be possible for the patient's INR labs to be done by home health and have the lab results faxed to the clinic.

## 2022-01-24 NOTE — Telephone Encounter (Signed)
Pt is followed by Anticoagulation Clinic in [place].   Called and spoke with Gevena Mart, [facility] Resident, who stated pt is currently admitted d/t possible endocarditis. Pt will be discharged with Home Health RN orders. I explained that the PT/INR can be drawn at nurse home visits and sent to Coumadin Clinic for dosing/management. I provided [facility] with [employer] #[phone] and also direct coumadin clinic #[phone]. Konrad Felix stated these directions will be in the Home Health orders and Home Health will reach out to anticoagulation clinic when pt is discharged.

## 2022-01-27 ENCOUNTER — Ambulatory Visit: Payer: Medicaid Other

## 2022-01-28 ENCOUNTER — Telehealth (HOSPITAL_COMMUNITY): Payer: Self-pay

## 2022-01-28 NOTE — Telephone Encounter (Signed)
Called and was unable to leave patient a voice message to confirm/remind patient of their appointment at the [facility] Failure Clinic on 01/29/22.

## 2022-01-28 NOTE — Progress Notes (Deleted)
Office Visit    Patient Name: Donald [name] Date of Encounter: 01/28/2022  Primary Care Provider:  No primary care provider on file. Primary Cardiologist:  Carlyle Dolly, MD Primary Electrophysiologist: None  Chief Complaint    Donald [name] is a 36 y.o. 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 EF 35-40% by echo in 07/2018 b. EF at 45% by repeat echo in 04/2020   DVT (deep venous thrombosis) (HCC)    h/o   Dysrhythmia    Mitral regurgitation    a. 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 b. 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HCC)    arthritis   Shock, septic and cardiogenic 12/28/2021   Tricuspid regurgitation    Past Surgical History:  Procedure Laterality Date   CARDIAC CATHETERIZATION     HERNIA REPAIR Right 2018   MITRAL VALVE REPAIR  06/07/2018   [place] N/A 06/23/2021   MULTIPLE EXTRACTIONS WITH ALVEOLOPLASTY N/A 11/08/2020   Procedure: EXTRACTION OF TEETH NUMBER ONE, FIFTHTEEN, SIXTEEN, SEVENTEEN, EIGHTTEEN AND THRTY WITH ALVEOLOPLASTY OF LOWER LEFT QUADRANT.;  Surgeon: Charlaine Dalton, DMD;  Location: [facility];  Service: Dentistry;  Laterality: N/A;   RIGHT/LEFT  HEART CATH AND CORONARY ANGIOGRAPHY N/A 09/11/2020   Procedure: RIGHT/LEFT HEART CATH AND CORONARY ANGIOGRAPHY;  Surgeon: Jolaine Artist, MD;  Location: [facility];  Service: Cardiovascular;  Laterality: N/A;   TEE WITHOUT CARDIOVERSION N/A 05/23/2020   Procedure: TRANSESOPHAGEAL ECHOCARDIOGRAM (TEE) WITH PROPOFOL;  Surgeon: Arnoldo Lenis, MD;  [facility];  Service: Endoscopy;  Laterality: N/A;    Allergies  No Known Allergies  History of Present Illness    Donald [name] is a 36 year old male who presents today for follow-up of MR and CHF.  He was initially seen by Dr. Harl Bowie in 2019 following acute respiratory failure and cardiac arrest with subsequent intubation at [facility], TEE was completed that showed severe MR with prolapse.   CT surgery was consulted and patient was sent to the OR for MV repair with resection and reconstruction of capillary cord with medial cords and placement of Simplici-T Annuloplasty ring via superior septal approach.  In 2020 repeat TEE was completed showing mobile Endo density at posterior leaflet thought to be possible surgical suture, versus torn chordae, versus vegetation.  Repeat TEE in 05/2020 with EF 40-45%, mildly reduced RV function, moderately enlarged RV, RA/LA severely dilated, severe MR noted with moderate MV stenosis and valve gradient of 6.1.  Patient underwent R/LHC in 08/2020 by Dr. Haroldine Laws which showed normal coronaries with reduced EF of 30-35% and severe MR and mild pulmonary venous HTN.  Patient was evaluated in 03/2021 for atrial flutter with RVR and had  DCCV to normal sinus rhythm.    He presented to ED on 12/8 with complaint of SOB with mild LE swelling and was discharged following diuresis with Lasix.  He returned to the ED on 12/14 with continued complaint of shortness of breath and increased fluid volume.  He was transferred to the ICU at [facility] for evaluation by advanced heart failure team for cardiogenic shock.   Plan for optimization from CHF with potential transfer to [facility] for redo repair versus replacement.  He was initiated on milrinone with improvement to CVP and diuresed 20 pounds.  He developed high PVC burden on milrinone and amiodarone drip was started.  He was discharged and transferred to [facility] and underwent MVR w/ mechanical valve redo on 12/26 by Dr. Wannetta Sender at [facility].  He was discharged on 1/30 on warfarin.  He presented to [facility] ED on 12/2021 with worsening N/V with diarrhea x2 days.  He was admitted for sepsis in the setting of likely gastroenteritis spiral, Farxiga, digoxin, losartan were held in the setting of septic shock and AKI.  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  Plan for patient to be seen by orthopedic oncology at [facility].  Patient was transferred to [facility] on 01/17/2022.   Since last being seen in the office patient reports***.  Patient denies chest pain, palpitations, dyspnea, PND, orthopnea, nausea, vomiting, dizziness, syncope, edema, weight gain, or early satiety.     ***Notes: SBE prophylaxis   Home Medications    Current Outpatient Medications  Medication Sig Dispense Refill   acetaminophen (TYLENOL) 325 MG tablet Take 2 tablets (650 mg total) by mouth every 4 (four) hours as needed for headache or mild pain.     amiodarone (PACERONE) 200 MG tablet Take 1 tablet (200 mg total) by mouth daily. 30 tablet 1   aspirin 81 MG chewable tablet Chew 1 tablet (81 mg total) by mouth daily. 30 tablet 1   carvedilol (COREG) 3.125 MG tablet Take 1 tablet (3.125 mg total) by mouth 2 (two) times daily with a meal.     ceFEPime (MAXIPIME) IVPB Inject 2 g into the vein every 8 (eight) hours. 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 93 Units 0   dapagliflozin propanediol (FARXIGA) 10 MG TABS tablet Take 1 tablet (10 mg total) by mouth daily. 30 tablet 1   digoxin (LANOXIN) 0.125 MG tablet Take 1 tablet (0.125 mg total) by mouth daily. 30 tablet 1   enoxaparin (LOVENOX) 80 MG/0.8ML injection Inject 0.8 mLs (80 mg total) into the skin every 12 (twelve) hours. Adjust as directed by heart failure and warfarin clinic. To be used only until warfarin is at correct level. 11.2 mL 0   feeding supplement (ENSURE ENLIVE / ENSURE PLUS) LIQD Take 237 mLs by mouth 3 (three) times daily between meals. 237 mL 12   ferrous sulfate 325 (65 FE) MG tablet Take 1 tablet (325 mg total) by mouth every other day.  3   furosemide (LASIX) 40 MG tablet Take 1 tablet (40 mg total) by mouth  daily as needed. As directed by heart failure clinic. Take potassium on days you take Lasix. 30 tablet 11   furosemide (LASIX) 40 MG tablet Take 1 tablet (40 mg total) by mouth daily. 30 tablet    midodrine (PROAMATINE) 2.5 MG tablet Take 1 tablet (2.5 mg total) by mouth 3 (three) times daily with meals. 90 tablet 1   oxyCODONE (OXY IR/ROXICODONE) 5 MG immediate release tablet Take 1 tablet (5 mg total) by mouth every 3 (three) hours as needed for moderate pain or severe pain. 30 tablet 0   polyethylene glycol (MIRALAX / GLYCOLAX) 17 g packet Take 17 g by mouth 2 (two) times daily. 14 each 0   potassium chloride SA (KLOR-CON M) 20 MEQ tablet Take 2 tablets (40 mEq total) by mouth daily as needed. Take potassium on days you take Lasix. 60 tablet 1   rosuvastatin (CRESTOR) 40 MG tablet Take 1 tablet (40 mg total) by mouth daily.     sacubitril-valsartan (ENTRESTO) 49-51 MG Take 1 tablet by mouth 2 (two) times daily. 60 tablet     senna (SENOKOT) 8.6 MG TABS tablet Take 2 tablets (17.2 mg total) by mouth 2 (two) times daily. 120 tablet 0   spironolactone (ALDACTONE) 25 MG tablet Take 1 tablet (25 mg total) by mouth daily.     vancomycin (VANCOCIN) 1-5 GM/200ML-% SOLN Inject 200 mLs (1,000 mg total) into the vein every 12 (twelve) hours. 4000 mL    vancomycin IVPB Inject 1,000 mg into the vein every 12 (twelve) hours. 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 Labs - Thursday:  BMP and vancomycin trough Labs - Every other week:  ESR and CRP Method of administration:Elastomeric Method of administration may be changed at the discretion of the patient and/or caregiver's ability to self-administer the medication ordered. 60 Units 0   warfarin (COUMADIN) 2 MG tablet Take 6 mg (3 tablets) on 7/28 then 4 mg (2 tablets) daily until your coumadin clinic appointment on 7/31. 60 tablet 4   No current facility-administered medications for this visit.     Review of Systems  Please see the history of present illness.    (+)*** (+)***  All other systems reviewed and are otherwise negative except as noted above.  Physical Exam    Wt Readings from Last 3 Encounters:  01/17/22 197 lb 12 oz (89.7 kg)  11/03/21 200 lb (90.7 kg)  10/11/21 200 lb 12.8 oz (91.1 kg)   WU:XLKGM were no vitals filed for this visit.,There is no height or weight on file to calculate BMI.  Constitutional:      Appearance: Healthy appearance. Not in distress.  Neck:     Vascular: JVD normal.  Pulmonary:     Effort: Pulmonary effort is normal.     Breath sounds: No wheezing. No rales. Diminished in the bases Cardiovascular:     Normal rate. Regular rhythm. Normal S1. Normal S2.      Murmurs: There is no murmur.  Edema:    Peripheral edema absent.  Abdominal:     Palpations: Abdomen is soft non tender. There is no  hepatomegaly.  Skin:    General: Skin is warm and dry.  Neurological:     General: No focal deficit present.     Mental Status: Alert and oriented to person, place and time.     Cranial Nerves: Cranial nerves are intact.  EKG/LABS/Other Studies Reviewed    ECG personally reviewed by me today - ***  Risk Assessment/Calculations:   {Does this patient have ATRIAL FIBRILLATION?[phone]}        Lab Results  Component Value Date   WBC 7.0 01/17/2022   HGB 8.2 (L) 01/17/2022   HCT 25.7 (L) 01/17/2022   MCV 80.8 01/17/2022   PLT 339 01/17/2022   Lab Results  Component Value Date   CREATININE 0.69 01/17/2022   BUN 17 01/17/2022   NA 134 (L) 01/17/2022   K 4.0 01/17/2022   CL 104 01/17/2022   CO2 25 01/17/2022   Lab Results  Component Value Date   ALT 60 (H) 01/16/2022   AST 92 (H) 01/16/2022   ALKPHOS 119 01/16/2022   BILITOT 0.9 01/16/2022   Lab Results  Component Value Date   CHOL 217 (H) 01/10/2022   HDL 41 01/10/2022   LDLCALC 163 (H) 01/10/2022   TRIG 63 01/10/2022   CHOLHDL 5.3 01/10/2022    Lab Results  Component Value Date   HGBA1C 5.6 01/10/2022    Assessment & Plan    1.  Combined chronic systolic/diastolic CHF  2.    3.***  4.  {The patient has an active order for outpatient cardiac rehabilitation.   Please indicate if the patient is ready to start. Do NOT delete this.  It will auto delete.  Refresh note, then sign.              Click here to document readiness and see contraindications.  :1}  Cardiac Rehabilitation Eligibility Assessment       Disposition: Follow-up with Carlyle Dolly, MD or APP in *** months {Are you ordering a CV Procedure (e.g. stress test, cath, DCCV, TEE, etc)?   Press F2        :[id number]}   Medication Adjustments/Labs and Tests Ordered: Current medicines are reviewed at length with the patient today.  Concerns regarding medicines are outlined above.   Signed, Mable Fill, Marissa Nestle, NP 01/28/2022, 9:36  AM [place]

## 2022-01-29 ENCOUNTER — Encounter (HOSPITAL_COMMUNITY): Payer: Medicaid Other

## 2022-01-30 ENCOUNTER — Ambulatory Visit: Payer: Medicaid Other | Admitting: Nurse Practitioner

## 2022-02-04 ENCOUNTER — Inpatient Hospital Stay: Payer: Medicaid Other | Admitting: Internal Medicine

## 2022-02-25 ENCOUNTER — Telehealth: Payer: Self-pay | Admitting: Student

## 2022-02-25 ENCOUNTER — Encounter: Payer: Self-pay | Admitting: Family Medicine

## 2022-02-25 NOTE — Telephone Encounter (Signed)
Spoke to Dr. Nile Riggs and Lynder Parents (case manager) on hospitalist team for [facility] treating this patient. The patient was transferred to [facility] initially from our service (see FPTS Transfer Summary) for an undifferentiated mass in the left upper extremity that needed removal and further differentiation. They had the facilities as a tertiary care center to remove this mass that we did not have. This was 01/17/22.   Today, spoke to case manager regarding inquisition about possible transfer of the patient back to our service at [facility], as he had the mass removed (was a hematoma) and was requiring further treatment that they felt may be able to be treated adequately at a hospital with lower level of care.   Discussed needs of the patient moving forward, he needs warfarin for prosthetic MV but was bleeding from area on buttocks from his pyoderma grangrenosum, causing transition to heparin gtt and addition of a stitch to the wound. He will also possibly need iHD and permcath placement. He has thus been off of all GDMT given his poor kidney function.   At this point, based on signout, it appears patient is not stable for transfer to lower level of care from tertiary center. However, after discussion with Dr. Nile Riggs, they will continue with monitoring active bleeding from wound sight while on anticoag and will decide on permcath. They will call back Friday for ongoing discussion and assessment of possibility of transfer.    Case was discussed with Dr. Jennette Kettle as well.    Alfredo Martinez, MD

## 2022-02-25 NOTE — Progress Notes (Unsigned)
Our team received a cal from [facility] today at approximately 14:00 from case manager.,Lynder Parents. Ms Harvel Quale was asking if we would be willing to take this patient back to [facility] or tomorrow. She said he is "stable-ish" and will need some long term care regarding monitoring his transition back to warfarin from heparin. She also said he had some open wounds that were intermittently bleeding which was hampering  their attepmts at transition to warfarin.She also said he was going to need dialysis but unclear if that would be permanent,  Dr. Jena Gauss took this call and then we called back to speak wit the physician. On call back we spoke with Dr. Annamary Rummage . Dr Annice Needy explained in more detail that the patient had "a pretty quiet afternoon" and had been stable since last transfusion yesterday. She reported: 1) he had been needing intermittent transfusions for bleeding  from lesions on his buttocks. They had consulted their Dermatologist but not yet had the consult but they think this is pyoderma gangrenosum and one had required a stitch for hemostasis. 2) pt had been on heparin gtt and transitioned to 1 mg warfarin prior to most recent bleeding episode. Presence of mechanical valve. 3. Currently he does not have a permacath. They plan to place one later this week. He was getting CRRT at some point (although this history is a little unclear to me) and areplanning to further investigate his need for long term dialysis with potential placement of a permacath. 4.) arm is hemiparetic after surgery on mass which turned out to be hematoma. 5. Biventricular heart failure s/p mechanical mitral valve replacement. No longer on entresto.  I expressed concern that the patient is not hemodynamically stable, most recently requiring transfusion yesterday, still intermittent bleeding from buttocks wounds, with a need to transition to chronic warfarin in setting of mechanical valve. The patient  would not  benefit from a transfer to our commuunity hospital. From his cuyrrent status in atertiary care center. Dr. Nile Riggs mentioned that it was an understanding that we would take our patient back after transfer to their tertiary care center. I again pointed out that he is not stable. Additionally given his multitude of problems and some issues still in process (transition to HD, need for permacath, unclear dx of lesions on buttock) on top of his very tenuous biventricular heart failure, I do not think it is safe for him to transfer here, Dr. Nile Riggs then said she was working toward stabilizing some of these issues and could potentially have them under control later in the week. I told her we would be available for future discussion.

## 2022-03-12 ENCOUNTER — Telehealth: Payer: Self-pay | Admitting: Family Medicine

## 2022-03-12 ENCOUNTER — Encounter (HOSPITAL_COMMUNITY): Payer: Self-pay

## 2022-03-12 DIAGNOSIS — R110 Nausea: Secondary | ICD-10-CM

## 2022-03-12 DIAGNOSIS — D649 Anemia, unspecified: Secondary | ICD-10-CM

## 2022-03-12 DIAGNOSIS — S40022D Contusion of left upper arm, subsequent encounter: Secondary | ICD-10-CM

## 2022-03-12 NOTE — Assessment & Plan Note (Signed)
History of hidradenitis suppurativa, pyoderma gangrenosum, open wounds on the buttocks. Previously treated with Doxy and Humira and resolved with prednisone tapers.Significant at bedtime with open sore and significant bleeding 9/18 9/10 had spurting from the wound on the buttock requiring sutures by general surgery with improvement of bruising.  Seen by plastic recommends reengaging after Stelara initiation improved wound care. - Follows with dermatology at [facility] received 9/22, repeat dose 10/22 then every 12 weeks thereafter - Wound care - Leave sutures in place due to concern for worsening bleeding - Once bleeding slows and off bivalirudin, consider removal - Rifampin 3 mg twice daily plus clindamycin 300 mg twice daily per dermatology - At discharge start clindamycin 1% lotion daily - Chlorhexidine wash daily in the shower to groin and buttock at discharge

## 2022-03-12 NOTE — Assessment & Plan Note (Signed)
Vegetation seen on TEE.  Repeat blood cultures negative and repeat TTE and TEE at [facility] without signs of vegetation. - Treated for culture-negative endocarditis per ID recs - Vanc/cefepime with completion on 8/24

## 2022-03-12 NOTE — Assessment & Plan Note (Signed)
Multiple scattered chronic microhemorrhages involving cerebellum MRI brain 01/09/2022 - Aspirin 81 mg daily - Rosuvastatin 10 mg daily - Needs follow-up with neuro provider - Follow-up the patient is scheduled at [facility]

## 2022-03-12 NOTE — Assessment & Plan Note (Signed)
Mixed picture, blood loss from wounds, renal failure, chronic illness, iron deficiency.  Last hemoglobin 7.4. - Transfuse for hemoglobin less than 7

## 2022-03-12 NOTE — Assessment & Plan Note (Addendum)
Improving.  Nausea for couple of months.  Negative H. pylori.  Seen by GI recommended PPI twice daily. - Olanzapine 2.5 mg daily - Ondansetron 4 mg 3 times daily - Trimethobenzamide 20 mg IM every 8 hours as needed nausea - Bowel regimen: PEG twice daily: Senna BID -Pain management: Hydrocodone/APAP 5-10 every 4 hours as needed pain - Regular diet without dairy

## 2022-03-12 NOTE — Telephone Encounter (Signed)
Received call back from [facility]. Patient has now stabilized and clinically improved.They are requesting transfer as primary issue has resolved.    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   [facility]MTS will accept this patient. Page [phone] when he arrives to the floor.   Dorris Singh, MD  Family Medicine Teaching Service

## 2022-03-12 NOTE — Assessment & Plan Note (Signed)
Rate controlled in sinus rhythm. - Continue amiodarone 200 mg daily - Holding home digoxin in the setting of renal failure - Anticoagulation as above, continue to monitor INR/PTT

## 2022-03-12 NOTE — Assessment & Plan Note (Signed)
S/p evacuation 8/7.  Op note reports band hematoma with no evidence of purulence.  Cultures remain negative except for 1 with broth growing CoNS which was felt to be contaminant. - Continues to have poor sensation and strength in distal left upper extremity likely from compressive process from mass rather than CVA - Ortho outpatient follow-up 10/19

## 2022-03-12 NOTE — Assessment & Plan Note (Addendum)
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  Has improved with HD but does still does not have good clearance.  Nephrology consult: Dr. Liliane Bade 9/26 at [facility] 925 1 L UF - AKI: Caused by ischemic ATN - HD MWF schedule - Continue furosemide 80 mg p.o. on non-HD days - Keep maps greater then 65 mmHg - Perm cath placement 9/18

## 2022-03-12 NOTE — Assessment & Plan Note (Signed)
HF due to valvular disease.  Consideration of transplant limited due to his history of pyoderma gangrenosum. - TTE LVEF 30%, improved from prior - Holding carvedilol in the setting of prior hypotension - Holding Entresto, spironolactone, SGLT2 in the setting of AKI - Furosemide per nephrology - We will need close cardiology follow-up upon discharge - Has an appointment to follow-up with [facility]

## 2022-03-12 NOTE — Telephone Encounter (Signed)
Received call from [facility] regarding possible transfer. [facility] physician unavailable to discuss at time of call. They will call back.

## 2022-03-14 NOTE — Telephone Encounter (Signed)
Called and discussed status of this patient's transfer with [facility], he has still not been assigned a bed. Requested to page hospitalist rounding on patient today for status update as Dr Owens Shark had last discussed him on 9/27 when she accepted the transfer. Discussed with [facility] hospitalist, notes his condition is similarly stable.  Anemia Hgb 6.9 yesterday, 1 unit pRBCs yesterday 9/28, last transfusion prior to that was 9/22 and he has been Hgb low 7's Suspected from wound bleeding/dressing changes  Acute renal failure Cr still improving, nephrology following,  9/25 last HD session, has permacath  Embolic Stoke/Atrial Fibrillation INR 3 this AM, was in 4s last couple of day, have been adjusting his warfarin  HS Flare Only update is that he has been requiring IV pain medications with dressing changes, Norco PO manages his pain during the day, IV Dilaudid during dressing changes only. Has been discussed with him that this will need to be weaned.   [facility] awaiting transfer of this patient, please page [phone] when he arrives to the floor.

## 2022-03-19 ENCOUNTER — Inpatient Hospital Stay: Admit: 2022-03-19 | Payer: Medicaid Other | Admitting: Family Medicine

## 2022-03-24 ENCOUNTER — Inpatient Hospital Stay: Admit: 2022-03-24 | Payer: Medicaid Other | Admitting: Family Medicine

## 2022-03-24 ENCOUNTER — Telehealth: Payer: Self-pay | Admitting: Family Medicine

## 2022-03-24 ENCOUNTER — Encounter (HOSPITAL_COMMUNITY): Payer: Self-pay

## 2022-03-24 NOTE — Telephone Encounter (Signed)
Spoke with Dr. Herma Carson at [facility] about this patient's transfer back to [facility] for predominately rehabilitation. His most recent INR is 2.1 on bivalirudin with goal of 2.5-3.5. Will admit to our service for admission to CIR. [facility] to coordinate transfer, likely within next 24 hours.

## 2022-04-04 ENCOUNTER — Emergency Department (HOSPITAL_COMMUNITY)
Admission: EM | Admit: 2022-04-04 | Discharge: 2022-04-05 | Disposition: A | Discharge status: Home / Self Care | Payer: Medicaid Other | Attending: Emergency Medicine | Admitting: Emergency Medicine

## 2022-04-04 ENCOUNTER — Emergency Department (HOSPITAL_COMMUNITY): Payer: Medicaid Other

## 2022-04-04 ENCOUNTER — Encounter (HOSPITAL_COMMUNITY): Payer: Self-pay | Admitting: Emergency Medicine

## 2022-04-04 DIAGNOSIS — I5022 Chronic systolic (congestive) heart failure: Secondary | ICD-10-CM | POA: Diagnosis not present

## 2022-04-04 DIAGNOSIS — R079 Chest pain, unspecified: Secondary | ICD-10-CM | POA: Diagnosis not present

## 2022-04-04 DIAGNOSIS — I471 Supraventricular tachycardia, unspecified: Secondary | ICD-10-CM | POA: Diagnosis not present

## 2022-04-04 DIAGNOSIS — I517 Cardiomegaly: Secondary | ICD-10-CM | POA: Diagnosis not present

## 2022-04-04 DIAGNOSIS — Z7901 Long term (current) use of anticoagulants: Secondary | ICD-10-CM | POA: Diagnosis not present

## 2022-04-04 DIAGNOSIS — R000 Tachycardia, unspecified: Secondary | ICD-10-CM | POA: Diagnosis present

## 2022-04-04 DIAGNOSIS — Z7982 Long term (current) use of aspirin: Secondary | ICD-10-CM | POA: Insufficient documentation

## 2022-04-04 MED ORDER — ETOMIDATE 2 MG/ML IV SOLN
INTRAVENOUS | Status: AC | PRN
  Administered 2022-04-04: 6 mg via INTRAVENOUS
  Administered 2022-04-04: 10 mg via INTRAVENOUS

## 2022-04-04 MED ORDER — ETOMIDATE 2 MG/ML IV SOLN
INTRAVENOUS | Status: AC
  Filled 2022-04-04: qty 10

## 2022-04-04 NOTE — Sedation Documentation (Signed)
Zoll pads applied 

## 2022-04-04 NOTE — Sedation Documentation (Signed)
Pt shocked with synced cardioversion by Horton MD at [facility].

## 2022-04-04 NOTE — ED Triage Notes (Signed)
Pt arrives POV c/o feeling like his heart is racing for the past 2 hours. Pt recently discharged from [facility] on Wednesday after being admitted for 14 wks for sepsis due to mechanical valve.

## 2022-04-05 ENCOUNTER — Emergency Department (HOSPITAL_COMMUNITY): Payer: Medicaid Other

## 2022-04-05 DIAGNOSIS — I517 Cardiomegaly: Secondary | ICD-10-CM | POA: Diagnosis not present

## 2022-04-05 DIAGNOSIS — R079 Chest pain, unspecified: Secondary | ICD-10-CM | POA: Diagnosis not present

## 2022-04-05 LAB — CBC
HCT: 24.5 % — ABNORMAL LOW (ref 39.0–52.0)
Hemoglobin: 7.7 g/dL — ABNORMAL LOW (ref 13.0–17.0)
MCH: 28.2 pg (ref 26.0–34.0)
MCHC: 31.4 g/dL (ref 30.0–36.0)
MCV: 89.7 fL (ref 80.0–100.0)
Platelets: 411 10*3/uL — ABNORMAL HIGH (ref 150–400)
RBC: 2.73 MIL/uL — ABNORMAL LOW (ref 4.22–5.81)
RDW: 17.7 % — ABNORMAL HIGH (ref 11.5–15.5)
WBC: 12.4 10*3/uL — ABNORMAL HIGH (ref 4.0–10.5)
nRBC: 0 % (ref 0.0–0.2)

## 2022-04-05 LAB — BASIC METABOLIC PANEL
Anion gap: 11 (ref 5–15)
BUN: 20 mg/dL (ref 6–20)
CO2: 20 mmol/L — ABNORMAL LOW (ref 22–32)
Calcium: 8.6 mg/dL — ABNORMAL LOW (ref 8.9–10.3)
Chloride: 106 mmol/L (ref 98–111)
Creatinine, Ser: 1.57 mg/dL — ABNORMAL HIGH (ref 0.61–1.24)
GFR, Estimated: 59 mL/min — ABNORMAL LOW (ref >60)
Glucose, Bld: 156 mg/dL — ABNORMAL HIGH (ref 70–99)
Potassium: 3.4 mmol/L — ABNORMAL LOW (ref 3.5–5.1)
Sodium: 137 mmol/L (ref 135–145)

## 2022-04-05 LAB — PROTIME-INR
INR: 2.6 — ABNORMAL HIGH (ref 0.8–1.2)
Prothrombin Time: 27.4 seconds — ABNORMAL HIGH (ref 11.4–15.2)

## 2022-04-05 LAB — MAGNESIUM: Magnesium: 1.4 mg/dL — ABNORMAL LOW (ref 1.7–2.4)

## 2022-04-05 LAB — DIGOXIN LEVEL: Digoxin Level: <0.2 ng/mL — ABNORMAL LOW (ref 0.8–2.0)

## 2022-04-05 LAB — TROPONIN I (HIGH SENSITIVITY)
Troponin I (High Sensitivity): 20 ng/L — ABNORMAL HIGH (ref <18)
Troponin I (High Sensitivity): 25 ng/L — ABNORMAL HIGH (ref <18)

## 2022-04-05 MED ORDER — POTASSIUM CHLORIDE CRYS ER 20 MEQ PO TBCR
40.0000 meq | EXTENDED_RELEASE_TABLET | Freq: Once | ORAL | Status: AC
  Administered 2022-04-05: 40 meq via ORAL
  Filled 2022-04-05: qty 2

## 2022-04-05 MED ORDER — MAGNESIUM SULFATE IN D5W 1-5 GM/100ML-% IV SOLN
1.0000 g | Freq: Once | INTRAVENOUS | Status: AC
  Administered 2022-04-05: 1 g via INTRAVENOUS
  Filled 2022-04-05: qty 100

## 2022-04-05 NOTE — ED Provider Notes (Signed)
[facility] EMERGENCY DEPARTMENT Provider Note   CSN: [id number] Arrival date & time: 04/04/22  2305     History  Chief Complaint  Patient presents with   Palpitations    Donald [name] is a 36 y.o. male.  HPI     This is a 36 year old male with complicated past medical history including biventricular heart failure, mechanical valve replacement on Coumadin, CVA, recent admission for endocarditis and sepsis versus genic shock.  He was discharged from the hospital on 10/18.  He presents tonight with tachycardia.  Patient reports palpitations over the last 2 hours.  He states he does not feel well.  No chest pain or shortness of breath but states that his palpitations are making him feel sick and nauseated.  On my arrival, heart rate in the 180s.  Unclear whether his SVT or atrial fibrillation/flutter with aberrancy.  He has a history of atrial flutter.  Patient denies any recent fevers.  He states compliance with medications although he has not taken his nighttime medications tonight.  He last took his amiodarone yesterday morning.  Home Medications Prior to Admission medications   Medication Sig Start Date End Date Taking? Authorizing Provider  acetaminophen (TYLENOL) 325 MG tablet Take 2 tablets (650 mg total) by mouth every 4 (four) hours as needed for headache or mild pain. 06/04/21   Clegg, Amy D, NP  amiodarone (PACERONE) 200 MG tablet Take 1 tablet (200 mg total) by mouth daily. 01/10/22   Ezequiel Essex, MD  aspirin 81 MG chewable tablet Chew 1 tablet (81 mg total) by mouth daily. 01/10/22   Ezequiel Essex, MD  carvedilol (COREG) 3.125 MG tablet Take 1 tablet (3.125 mg total) by mouth 2 (two) times daily with a meal. 01/17/22   Eppie Gibson, MD  dapagliflozin propanediol (FARXIGA) 10 MG TABS tablet Take 1 tablet (10 mg total) by mouth daily. 01/10/22   Ezequiel Essex, MD  digoxin (LANOXIN) 0.125 MG tablet Take 1 tablet (0.125 mg total) by mouth daily. 01/10/22   Ezequiel Essex, MD  enoxaparin (LOVENOX) 80 MG/0.8ML injection Inject 0.8 mLs (80 mg total) into the skin every 12 (twelve) hours. Adjust as directed by heart failure and warfarin clinic. To be used only until warfarin is at correct level. 01/09/22   Ezequiel Essex, MD  feeding supplement (ENSURE ENLIVE / ENSURE PLUS) LIQD Take 237 mLs by mouth 3 (three) times daily between meals. 01/09/22   Ezequiel Essex, MD  ferrous sulfate 325 (65 FE) MG tablet Take 1 tablet (325 mg total) by mouth every other day. 01/18/22   Eppie Gibson, MD  furosemide (LASIX) 40 MG tablet Take 1 tablet (40 mg total) by mouth daily as needed. As directed by heart failure clinic. Take potassium on days you take Lasix. 01/09/22 01/09/23  Ezequiel Essex, MD  furosemide (LASIX) 40 MG tablet Take 1 tablet (40 mg total) by mouth daily. 01/18/22   Eppie Gibson, MD  midodrine (PROAMATINE) 2.5 MG tablet Take 1 tablet (2.5 mg total) by mouth 3 (three) times daily with meals. 01/09/22   Ezequiel Essex, MD  oxyCODONE (OXY IR/ROXICODONE) 5 MG immediate release tablet Take 1 tablet (5 mg total) by mouth every 3 (three) hours as needed for moderate pain or severe pain. 01/17/22   Eppie Gibson, MD  polyethylene glycol (MIRALAX / GLYCOLAX) 17 g packet Take 17 g by mouth 2 (two) times daily. 01/17/22   Eppie Gibson, MD  potassium chloride SA (KLOR-CON M) 20 MEQ  tablet Take 2 tablets (40 mEq total) by mouth daily as needed. Take potassium on days you take Lasix. 01/09/22   Ezequiel Essex, MD  rosuvastatin (CRESTOR) 40 MG tablet Take 1 tablet (40 mg total) by mouth daily. 01/18/22   Eppie Gibson, MD  sacubitril-valsartan (ENTRESTO) 49-51 MG Take 1 tablet by mouth 2 (two) times daily. 01/17/22   Eppie Gibson, MD  senna (SENOKOT) 8.6 MG TABS tablet Take 2 tablets (17.2 mg total) by mouth 2 (two) times daily. 01/17/22   Eppie Gibson, MD  spironolactone (ALDACTONE) 25 MG tablet Take 1 tablet (25 mg total) by mouth daily. 01/18/22   Eppie Gibson, MD  vancomycin (VANCOCIN) 1-5 GM/200ML-% SOLN Inject 200 mLs (1,000 mg total) into the vein every 12 (twelve) hours. 01/18/22   Eppie Gibson, MD  warfarin (COUMADIN) 2 MG tablet Take 6 mg (3 tablets) on 7/28 then 4 mg (2 tablets) daily until your coumadin clinic appointment on 7/31. 01/09/22   Ezequiel Essex, MD      Allergies    Patient has no known allergies.    Review of Systems   Review of Systems  Constitutional:  Negative for fever.  Respiratory:  Negative for shortness of breath.   Cardiovascular:  Positive for palpitations. Negative for chest pain and leg swelling.  Gastrointestinal:  Negative for abdominal pain.  All other systems reviewed and are negative.   Physical Exam Updated Vital Signs BP 109/85   Pulse 93   Temp 98.2 F (36.8 C) (Oral)   Resp 20   Ht 1.778 m (5\' 10" )   Wt 89.7 kg   SpO2 99%   BMI 28.37 kg/m  Physical Exam Vitals and nursing note reviewed.  Constitutional:      Appearance: He is well-developed.     Comments: Chronically ill-appearing, nontoxic  HENT:     Head: Normocephalic and atraumatic.     Mouth/Throat:     Mouth: Mucous membranes are dry.  Eyes:     Pupils: Pupils are equal, round, and reactive to light.  Cardiovascular:     Rate and Rhythm: Regular rhythm. Tachycardia present.     Heart sounds: Normal heart sounds. No murmur heard.    Comments: Multiple chest scars clean and dry Pulmonary:     Effort: Pulmonary effort is normal. No respiratory distress.     Breath sounds: Normal breath sounds. No wheezing.  Abdominal:     Palpations: Abdomen is soft.     Tenderness: There is no abdominal tenderness. There is no rebound.  Musculoskeletal:     Cervical back: Neck supple.     Comments: Wasting left upper extremity  Lymphadenopathy:     Cervical: No cervical adenopathy.  Skin:    General: Skin is warm and dry.  Neurological:     Mental Status: He is alert and oriented to person, place, and time.     Comments: Weakness  left upper extremity  Psychiatric:        Mood and Affect: Mood normal.     ED Results / Procedures / Treatments   Labs (all labs ordered are listed, but only abnormal results are displayed) Labs Reviewed  DIGOXIN LEVEL - Abnormal; Notable for the following components:      Result Value   Digoxin Level <0.2 (*)    All other components within normal limits  BASIC METABOLIC PANEL - Abnormal; Notable for the following components:   Potassium 3.4 (*)    CO2 20 (*)  Glucose, Bld 156 (*)    Creatinine, Ser 1.57 (*)    Calcium 8.6 (*)    GFR, Estimated 59 (*)    All other components within normal limits  CBC - Abnormal; Notable for the following components:   WBC 12.4 (*)    RBC 2.73 (*)    Hemoglobin 7.7 (*)    HCT 24.5 (*)    RDW 17.7 (*)    Platelets 411 (*)    All other components within normal limits  PROTIME-INR - Abnormal; Notable for the following components:   Prothrombin Time 27.4 (*)    INR 2.6 (*)    All other components within normal limits  MAGNESIUM - Abnormal; Notable for the following components:   Magnesium 1.4 (*)    All other components within normal limits  TROPONIN I (HIGH SENSITIVITY) - Abnormal; Notable for the following components:   Troponin I (High Sensitivity) 20 (*)    All other components within normal limits  TROPONIN I (HIGH SENSITIVITY) - Abnormal; Notable for the following components:   Troponin I (High Sensitivity) 25 (*)    All other components within normal limits    EKG EKG Interpretation  Date/Time:  Friday April 04 2022 23:51:46 EDT Ventricular Rate:  102 PR Interval:  164 QRS Duration: 105 QT Interval:  359 QTC Calculation: 468 R Axis:   60 Text Interpretation: Sinus tachycardia Inferolateral infarct, old no longer SVT Confirmed by Thayer Jew [phone]) on 04/05/2022 3:21:56 AM  Radiology DG Chest Portable 1 View  Result Date: 04/05/2022 CLINICAL DATA:  Chest pain. EXAM: PORTABLE CHEST 1 VIEW COMPARISON:  Chest  radiograph dated 12/28/2021. FINDINGS: No focal consolidation, pleural effusion or pneumothorax. There is cardiomegaly. Median sternotomy wires and mechanical cardiac valve. No acute osseous pathology. IMPRESSION: 1. No active disease. 2. Cardiomegaly. Electronically Signed   By: Anner Crete M.D.   On: 04/05/2022 01:24    Procedures .Critical Care  Performed by: [name], MD Authorized by: [name], MD   Critical care provider statement:    Critical care time (minutes):  45   Critical care was necessary to treat or prevent imminent or life-threatening deterioration of the following conditions: Arrhythmia.   Critical care was time spent personally by me on the following activities:  Development of treatment plan with patient or surrogate, discussions with consultants, evaluation of patient's response to treatment, examination of patient, ordering and review of laboratory studies, ordering and review of radiographic studies, ordering and performing treatments and interventions, pulse oximetry, re-evaluation of patient's condition and review of old charts .Cardioversion  Date/Time: 04/05/2022 3:15 AM  Performed by: [name], MD Authorized by: [name], MD   Consent:    Consent obtained:  Verbal   Consent given by:  Patient   Risks discussed:  Cutaneous burn, death and induced arrhythmia   Alternatives discussed:  No treatment Pre-procedure details:    Cardioversion basis:  Emergent   Rhythm:  Supraventricular tachycardia   Electrode placement:  Anterior-posterior Patient sedated: Yes. Refer to sedation procedure documentation for details of sedation.  Attempt one:    Cardioversion mode:  Synchronous   Shock (Joules):  200   Shock outcome:  Conversion to normal sinus rhythm Post-procedure details:    Patient status:  Awake   Patient tolerance of procedure:  Tolerated well, no immediate complications .Sedation  Date/Time: 04/05/2022 3:15  AM  Performed by: [name], MD Authorized by: [name], MD   Consent:  Consent obtained:  Verbal   Consent given by:  Patient   Risks discussed:  Allergic reaction, dysrhythmia, prolonged sedation necessitating reversal and prolonged hypoxia resulting in organ damage Universal protocol:    Immediately prior to procedure, a time out was called: yes   Indications:    Procedure performed:  Cardioversion   Procedure necessitating sedation performed by:  Physician performing sedation Pre-sedation assessment:    Time since last food or drink:  Dinner   NPO status caution: urgency dictates proceeding with non-ideal NPO status     ASA classification: class 3 - patient with severe systemic disease     Mouth opening:  3 or more finger widths   Thyromental distance:  4 finger widths   Mallampati score:  II - soft palate, uvula, fauces visible   Neck mobility: normal     Pre-sedation assessments completed and reviewed: airway patency, cardiovascular function, hydration status, mental status, nausea/vomiting and pain level   Immediate pre-procedure details:    Reassessment: Patient reassessed immediately prior to procedure     Reviewed: vital signs and NPO status     Verified: bag valve mask available, emergency equipment available, IV patency confirmed and oxygen available   Procedure details (see MAR for exact dosages):    Preoxygenation:  Nasal cannula   Sedation:  Etomidate   Analgesia:  None   Intra-procedure monitoring:  Blood pressure monitoring, cardiac monitor and continuous pulse oximetry   Intra-procedure events: none     Total Provider sedation time (minutes):  10 Post-procedure details:    Post-sedation assessment completed:  04/05/2022 3:17 AM   Attendance: Constant attendance by certified staff until patient recovered     Recovery: Patient returned to pre-procedure baseline     Post-sedation assessments completed and reviewed: airway patency,  cardiovascular function, hydration status, mental status, nausea/vomiting, pain level, respiratory function and temperature     Patient is stable for discharge or admission: yes   Ultrasound ED Peripheral IV (Provider)  Date/Time: 04/05/2022 3:17 AM  Performed by: [name], MD Authorized by: [name], MD   Procedure details:    Indications: hypotension and multiple failed IV attempts     Skin Prep: chlorhexidine gluconate     Location:  Right AC   Angiocath:  18 G   Bedside Ultrasound Guided: Yes     Images: not archived     Patient tolerated procedure without complications: Yes     Dressing applied: No       Medications Ordered in ED Medications  etomidate (AMIDATE) injection (10 mg Intravenous Given 04/04/22 2349)  magnesium sulfate IVPB 1 g 100 mL (0 g Intravenous Stopped 04/05/22 0228)  potassium chloride SA (KLOR-CON M) CR tablet 40 mEq (40 mEq Oral Given 04/05/22 0232)    ED Course/ Medical Decision Making/ A&P                           Medical Decision Making Amount and/or Complexity of Data Reviewed Labs: ordered. Radiology: ordered.  Risk Prescription drug management.   This patient presents to the ED for concern of palpitations, this involves an extensive number of treatment options, and is a complaint that carries with it a high risk of complications and morbidity.  I considered the following differential and admission for this acute, potentially life threatening condition.  The differential diagnosis includes arrhythmia, ACS, heart failure, metabolic derangements, medication induced arrhythmia  MDM:    This is a 36 year old  male with complicated past medical history and cardiac history who presents with palpitations.  He is noted to be tachycardic with heart rate in the 180s.  Initial EKG looks most consistent with SVT although atrial flutter is also a consideration.  Patient notably hypotensive upon arrival with a blood pressure 72/22.   Patient looks quite uncomfortable.  I have reviewed his chart and noted that his recent INR 2 days ago was 2.6.  Given that he is unstable and has multiple blockade medications including amiodarone and digoxin, feel it is most safe to synchronize cardiovert him.  He has had this done in the past.  He consents to this emergently.  He was sync cardioverted at the bedside with return of his heart rate to normal sinus rhythm.  He states he feels much better.  He did not have any active or ongoing chest discomfort.  Labs obtained.  Mild hypokalemia and hypomagnesemia for which he was given oral potassium and IV magnesium.  Hemoglobin is comparable to his discharge hemoglobin as is his creatinine of 1.6 which is close to his creatinine at discharge.  Patient has remained hemodynamically stable with improving blood pressure and no recurrence of arrhythmia.  He would like to be discharged home.  Feel this is reasonable given that he has returned back to his sinus rhythm and is no longer symptomatic.  I encouraged him to take his medications as prescribed.  (Labs, imaging, consults)  Labs: I Ordered, and personally interpreted labs.  The pertinent results include: CBC, BMP, PT/INR, digoxin level, troponin  Imaging Studies ordered: I ordered imaging studies including chest x-ray I independently visualized and interpreted imaging. I agree with the radiologist interpretation  Additional history obtained from bedside.  External records from outside source obtained and reviewed including discharge summary from [facility]: The patient was maintained on a cardiac monitor.  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 EF 35-40% by echo in 07/2018 b. EF at 45% by repeat echo in 04/2020   DVT (deep venous thrombosis) ([facility])    h/o   Dysrhythmia    Mitral regurgitation    a. 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 b. 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[facility])    arthritis   Shock, septic and cardiogenic 12/28/2021   Tricuspid regurgitation      Medicines Meds ordered this encounter  Medications   etomidate (AMIDATE) 2 MG/ML injection    Trilby Leaver N: cabinet override   etomidate (AMIDATE) injection   magnesium sulfate IVPB 1 g 100 mL   potassium chloride SA (KLOR-CON M) CR tablet 40 mEq    I have reviewed the patients home medicines and have made adjustments as needed  Problem List / ED Course: Problem List Items Addressed This Visit  Other   Hypomagnesemia   Other Visit Diagnoses     SVT (supraventricular tachycardia)    -  Primary                   Final Clinical Impression(s) / ED Diagnoses Final diagnoses:  SVT (supraventricular tachycardia)  Hypomagnesemia    Rx / DC Orders ED Discharge Orders     None         Dina Rich, Barbette Hair, MD 04/05/22 [phone]

## 2022-04-05 NOTE — ED Notes (Signed)
Recliner placed in room for pt father- warm blanket given- lights dimmed for comfort- call bell within reach of pt

## 2022-04-05 NOTE — ED Notes (Signed)
Pt remains on pacer pads with code cart at bedside.

## 2022-04-05 NOTE — ED Notes (Signed)
Pt states he feels better, pt alert and oriented x 4. Pt father at bedside.

## 2022-04-05 NOTE — Discharge Instructions (Signed)
You were seen today for a high heart rate.  This was likely SVT.  Your magnesium and potassium were replaced as these were slightly low.  Follow-up closely with your cardiologist.  Continue your digoxin and amiodarone as prescribed.

## 2022-04-05 NOTE — ED Notes (Signed)
Pt given water per Dr Dina Rich ok

## 2022-04-10 DIAGNOSIS — Z79899 Other long term (current) drug therapy: Secondary | ICD-10-CM | POA: Diagnosis not present

## 2022-04-10 DIAGNOSIS — R238 Other skin changes: Secondary | ICD-10-CM | POA: Diagnosis not present

## 2022-04-10 DIAGNOSIS — F418 Other specified anxiety disorders: Secondary | ICD-10-CM | POA: Diagnosis not present

## 2022-04-10 DIAGNOSIS — I509 Heart failure, unspecified: Secondary | ICD-10-CM | POA: Diagnosis not present

## 2022-04-10 DIAGNOSIS — I499 Cardiac arrhythmia, unspecified: Secondary | ICD-10-CM | POA: Diagnosis not present

## 2022-04-10 DIAGNOSIS — Z13220 Encounter for screening for lipoid disorders: Secondary | ICD-10-CM | POA: Diagnosis not present

## 2022-04-10 DIAGNOSIS — Z6826 Body mass index (BMI) 26.0-26.9, adult: Secondary | ICD-10-CM | POA: Diagnosis not present

## 2022-04-10 DIAGNOSIS — Z8673 Personal history of transient ischemic attack (TIA), and cerebral infarction without residual deficits: Secondary | ICD-10-CM | POA: Diagnosis not present

## 2022-04-10 DIAGNOSIS — Z952 Presence of prosthetic heart valve: Secondary | ICD-10-CM | POA: Diagnosis not present

## 2022-04-10 DIAGNOSIS — Z7689 Persons encountering health services in other specified circumstances: Secondary | ICD-10-CM | POA: Diagnosis not present

## 2022-04-10 DIAGNOSIS — Z09 Encounter for follow-up examination after completed treatment for conditions other than malignant neoplasm: Secondary | ICD-10-CM | POA: Diagnosis not present

## 2022-04-10 DIAGNOSIS — L732 Hidradenitis suppurativa: Secondary | ICD-10-CM | POA: Diagnosis not present

## 2022-04-17 ENCOUNTER — Ambulatory Visit: Payer: Medicaid Other | Attending: Internal Medicine | Admitting: *Deleted

## 2022-04-17 DIAGNOSIS — Z5181 Encounter for therapeutic drug level monitoring: Secondary | ICD-10-CM

## 2022-04-17 DIAGNOSIS — Z952 Presence of prosthetic heart valve: Secondary | ICD-10-CM

## 2022-04-17 LAB — POCT INR: INR: 1.7 — AB (ref 2.0–3.0)

## 2022-04-17 NOTE — Patient Instructions (Signed)
D/C from [facility] on warfarin 7.5mg  daily Take warfarin 2 tablets tonight, 1 1/2 tablets tomorrow night then increase dose to 1 tablet daily except 1 1/2 tablet on Sundays  Continue greens/salads  Recheck in 1 wk

## 2022-04-18 ENCOUNTER — Encounter (HOSPITAL_COMMUNITY): Payer: Medicaid Other

## 2022-04-18 ENCOUNTER — Encounter (HOSPITAL_BASED_OUTPATIENT_CLINIC_OR_DEPARTMENT_OTHER): Payer: Medicaid Other | Attending: General Surgery | Admitting: General Surgery

## 2022-04-18 DIAGNOSIS — L732 Hidradenitis suppurativa: Secondary | ICD-10-CM | POA: Diagnosis not present

## 2022-04-18 DIAGNOSIS — I509 Heart failure, unspecified: Secondary | ICD-10-CM | POA: Diagnosis not present

## 2022-04-18 DIAGNOSIS — I6309 Cerebral infarction due to thrombosis of other precerebral artery: Secondary | ICD-10-CM | POA: Insufficient documentation

## 2022-04-19 NOTE — Progress Notes (Signed)
Donald [name], Donald [name] [name] ([id number]) [id number] Page 1 of 8 Visit Report for 04/18/2022 Chief Complaint Document Details Patient Name: Date of Service: Donald [name], Donald [place] RCUS [name]. 04/18/2022 8:15 A M Medical Record Number: [id number] Patient Account Number: [id number] Date of Birth/Sex: Treating RN: 1986-03-10 (36 y.o. M) Primary Care Provider: PA Donald [name], NO Other Clinician: Referring Provider: Treating Provider/Extender: Donald [name] Weeks in Treatment: 0 Information Obtained from: Patient Chief Complaint Patient today is here for follow up wounds on his right lateral and medial lower leg as well as his left lower leg 04/18/2022: Referred for hidradenitis supprativa Electronic Signature(s) Signed: 04/18/2022 9:15:32 AM By: Donald Maudlin MD FACS Entered By: Donald [name] on 04/18/2022 09:15:32 -------------------------------------------------------------------------------- HPI Details Patient Name: Date of Service: Donald [name], Donald [place] MA RCUS [name]. 04/18/2022 8:15 A M Medical Record Number: [id number] Patient Account Number: [id number] Date of Birth/Sex: Treating RN: 1986/05/22 (36 y.o. M) Primary Care Provider: PA Donald [name], NO Other Clinician: Referring Provider: Treating Provider/Extender: Donald [name] Weeks in Treatment: 0 History of Present Illness HPI Description: The patient is a 36 yrs old bm referred here by his primary physician Donald [name]. He does not have any underlying systemic conditions and is not a diabetic. In February he developed a painful nodule of the right lower lateral leg. He was seen by Donald [name] in [place] and underwent debridement. The left leg started to develop ulcerations shortly after. He has been dealing with intermittent abscess formation on his face in the beard area. He has a Art gallery manager cut his beard to try and prevent skin breakdown. He denies any fevers or IV drug abuse. There is no history of inflammatory  bowel disease. A punch biopsy 11/2014 of RLE showed acute and chronic inflammation without evidence of fungus or acid-fast bacteria. He had biopsy LLE 8.15.16 and again inflammation noted. Prealbumin 14 HbA1c 5.7. 02/05/2015 Current wound alginate on one leg and Drawtex opposite. Given Medicaid alone, no dressings covered and pays out of pocket. States, alginate less expensive. Again reviewed has appearance of pyoderma and any intervention in this setting will increase wound. Plan alginate bilat LE and f/u 4 weeks after Dermatology visit (02/28/15). Patient requested pain medication, states Norco no effect. I declined and asked that he follow up with PCP regarding this chronic pain. 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 He has been referred back to our clinic with hidradenitis suppurativa in his gluteal folds. It appears that there was some misunderstanding, based on my interpretation of the electronic medical record, that the lesions on his buttocks were presented pyoderma, hence the referral to the wound care center. Of note, he does have an appointment with dermatology at [facility] coming up next Monday. He is currently taking clindamycin and they are using silver alginate on his open lesions with Vashe wound cleaner. Electronic Signature(s) Signed: 04/18/2022 9:17:43 AM By: Donald Maudlin MD FACS Entered By: Donald [name] on 04/18/2022 [place], [place] ([id number]) [id number] Page 2 of 8 -------------------------------------------------------------------------------- Physical Exam Details Patient Name: Date of Service: [facility] [name], [place] RCUS [name]. 04/18/2022 8:15 A M Medical Record Number: [id number] Patient Account Number: [id number] Date of Birth/Sex: Treating RN: [birth year] (36 y.o. M) Primary Care Provider: PA Donald [name],  NO Other Clinician: Referring Provider: Treating Provider/Extender: Donald [name] Weeks in Treatment: 0 Constitutional . No acute distress. Respiratory Normal work of  breathing on room air.. Notes 04/18/2022: On inspection of his natal cleft, there is significant scarring from what appeared to be prior episodes of hidradenitis suppurativa. He has multiple open wounds at this time, which are quite tender. They are very clean, however, without purulent drainage. Electronic Signature(s) Signed: 04/18/2022 9:18:33 AM By: Donald Maudlin MD FACS Entered By: Donald [name] on 04/18/2022 09:18:32 -------------------------------------------------------------------------------- Physician Orders Details Patient Name: Date of Service: Donald [name], [place] MA RCUS [name]. 04/18/2022 8:15 A M Medical Record Number: [id number] Patient Account Number: [id number] Date of Birth/Sex: Treating RN: 1986/02/22 (36 y.o. 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 Follow-up with Dermatology. Bathing/ Shower/ Hygiene Other Bathing/Shower/Hygiene Orders/Instructions: - Wash with Hibiclens ( 4% Chlorhexidine CHG) and the use the Vasch. Wound Treatment Wound #5 - Gluteus Wound Laterality: Left Cleanser: Byram Ancillary Kit - 15 Day Supply (DME) (Generic) 1 x Per Day/30 Days Discharge Instructions: Use supplies as instructed; Kit contains: (15) Saline Bullets; (15) 3x3 Gauze; 15 pr Gloves Prim Dressing: KerraCel Ag Gelling Fiber Dressing, 4x5 in (silver alginate) (DME) (Generic) 1 x Per Day/30  Days ary Discharge Instructions: Apply silver alginate to wound bed as instructed Secondary Dressing: ABD Pad, 8x10 (DME) (Generic) 1 x Per Day/30 Days Discharge Instructions: Apply over primary dressing as directed. ALPHONS, BURGERT [name] ([id number]) [id number] Page 3 of 8 Secondary Dressing: MPM Excel SAP Bordered Dressing, 7x6.7 (Sacral) (in/in) (DME) (Generic) 1 x Per Day/30 Days Discharge Instructions: Apply silicone border over primary dressing as directed. Secondary Dressing: Optifoam Non-Adhesive Dressing, 4x4 in (DME) (Generic) 1 x Per Day/30 Days Discharge Instructions: Apply over primary dressing as directed. Secured With: 76M Medipore H Soft Cloth Surgical T ape, 4 x 10 (in/yd) 1 x Per Day/30 Days Discharge Instructions: Secure with tape as directed. Wound #6 - Gluteus Wound Laterality: Right Cleanser: Byram Ancillary Kit - 15 Day Supply (DME) (Generic) 1 x Per Day/30 Days Discharge Instructions: Use supplies as instructed; Kit contains: (15) Saline Bullets; (15) 3x3 Gauze; 15 pr Gloves Prim Dressing: KerraCel Ag Gelling Fiber Dressing, 4x5 in (silver alginate) (DME) (Generic) 1 x Per Day/30 Days ary Discharge Instructions: Apply silver alginate to wound bed as instructed Secondary Dressing: ABD Pad, 8x10 (DME) (Generic) 1 x Per Day/30 Days Discharge Instructions: Apply over primary dressing as directed. Secondary Dressing: MPM Excel SAP Bordered Dressing, 7x6.7 (Sacral) (in/in) (DME) (Generic) 1 x Per Day/30 Days Discharge Instructions: Apply silicone border over primary dressing as directed. Secondary Dressing: Optifoam Non-Adhesive Dressing, 4x4 in (DME) (Generic) 1 x Per Day/30 Days Discharge Instructions: Apply over primary dressing as directed. Secured With: 76M Medipore H Soft Cloth Surgical T ape, 4 x 10 (in/yd) (DME) (Generic) 1 x Per Day/30 Days Discharge Instructions: Secure with tape as directed. Electronic Signature(s) Signed: 04/18/2022  9:19:35 AM By: Donald Maudlin MD FACS Entered By: Donald [name] on 04/18/2022 09:19:35 -------------------------------------------------------------------------------- Problem List Details Patient Name: Date of Service: Donald [name], [place] RCUS [name]. 04/18/2022 8:15 A M Medical Record Number: [id number] Patient Account Number: [id number] Date of Birth/Sex: Treating RN: 1985/12/09 (36 y.o. 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 04/18/2022 8:15 A M Medical Record Number: [id number] Patient Account Number: [id number] Date of Birth/Sex: Treating RN: 09-26-1985 (36 y.o. M) Primary Care Provider: PA Donald [name], NO Other Clinician: Referring Provider: Treating Provider/Extender: Donald [name] Weeks in Treatment: 0 Subjective Chief Complaint Information obtained from Patient Patient today is here for follow up wounds on his right lateral and medial lower leg as well as his left lower leg 04/18/2022: Referred for hidradenitis supprativa History of Present Illness (HPI) The patient is a 36 yrs old bm referred here by his primary physician Donald [name]. He does not have any underlying systemic  conditions and is not a diabetic. In February he developed a painful nodule of the right lower lateral leg. He was seen by Donald [name] in [place] and underwent debridement. The left leg started to develop ulcerations shortly after. He has been dealing with intermittent abscess formation on his face in the beard area. He has a Art gallery manager cut his beard to try and prevent skin breakdown. He denies any fevers or IV drug abuse. There is no history of inflammatory bowel disease. A punch biopsy 11/2014 of RLE showed acute and chronic inflammation without evidence of fungus or acid-fast bacteria. He had biopsy LLE 8.15.16 and again inflammation noted. Prealbumin 14 HbA1c 5.7. 02/05/2015 Current wound alginate on one leg and Drawtex opposite. Given Medicaid alone, no dressings covered and pays out of pocket. States, alginate less expensive. Again reviewed has appearance of pyoderma and any intervention in this setting will increase wound. Plan alginate bilat LE and f/u 4 weeks after Dermatology visit (02/28/15). Patient requested pain medication, states Norco no effect. I declined and asked that he follow up with PCP regarding this chronic pain. 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 He has been referred back to our clinic with hidradenitis suppurativa in his gluteal folds. It appears that there was some misunderstanding, based on my interpretation of the electronic medical record, that the lesions on his buttocks were presented pyoderma, hence the referral to the wound care center. Of note, he does have an appointment with dermatology at [facility] coming up next Monday. He is currently taking clindamycin and they are using silver alginate on his open lesions with Vashe wound cleaner. Patient History Information obtained from Patient. Allergies No Known Allergies Family History Unknown  History. Social History Former smoker - February 2016, Marital Status - Single, Alcohol Use - Never, Drug Use - No History - marijuana, Caffeine Use - Rarely - soda. Medical History Eyes Denies history of Cataracts, Glaucoma, Optic Neuritis Ear/Nose/Mouth/Throat Denies history of Chronic sinus problems/congestion, Middle ear problems Hematologic/Lymphatic Denies history of Anemia, Hemophilia, Human Immunodeficiency Virus, Lymphedema, Sickle Cell Disease Respiratory Denies history of Aspiration, Asthma, Chronic Obstructive Pulmonary Disease (COPD), Pneumothorax, Sleep Apnea, Tuberculosis Cardiovascular Patient has history of Congestive Heart Failure - On Entresto Denies history of Angina, Arrhythmia, Coronary Artery Disease, Deep Vein Thrombosis, Hypertension, Hypotension, Myocardial  Infarction, Peripheral Arterial Disease, Peripheral Venous Disease, Phlebitis, Vasculitis Gastrointestinal Denies history of Cirrhosis , Colitis, Crohnoos, Hepatitis A, Hepatitis B, Hepatitis C Endocrine Denies history of Type I Diabetes, Type II Diabetes Genitourinary Denies history of End Stage Renal Disease Immunological Denies history of Lupus Erythematosus, Raynaudoos, Scleroderma Integumentary (Skin) Denies history of History of Burn Musculoskeletal Donald [name], Donald [name] ([id number]) [id number] Page 5 of 8 Denies history of Gout, Rheumatoid Arthritis, Osteoarthritis, Osteomyelitis Neurologic Denies history of Dementia, Neuropathy, Quadriplegia, Paraplegia, Seizure Disorder Oncologic Denies history of Received Chemotherapy, Received Radiation Psychiatric Denies history of Anorexia/bulimia, Confinement Anxiety Hospitalization/Surgery History - right shoulder repair. - IandD of bilateral lower leg wounds in February 2016. - Mitral Valve repair. Medical A Surgical History Notes nd Cardiovascular Hx: Mitral Valve Repair Objective Constitutional No acute  distress. Vitals Time Taken: 8:06 AM, Height: 69 in, Weight: 176 lbs, BMI: 26, Respiratory Rate: 16 breaths/min. Respiratory Normal work of breathing on room air.. General Notes: 04/18/2022: On inspection of his natal cleft, there is significant scarring from what appeared to be prior episodes of hidradenitis suppurativa. He has multiple open wounds at this time, which are quite tender. They are very clean, however, without purulent drainage. Integumentary (Hair, Skin) Wound #4 status is Healed - Epithelialized. Original cause of wound was Gradually Appeared. The date acquired was: 02/05/2015. The wound is located on the Left,Lateral Lower Leg. The wound measures 0cm length x 0cm width x 0cm depth; 0cm^2 area and 0cm^3 volume. There is no tunneling or undermining noted. There is no granulation within the wound bed. There is no necrotic tissue within the wound bed. Wound #5 status is Open. Original cause of wound was Gradually Appeared. The date acquired was: 01/15/2022. The wound is located on the Left Gluteus. The wound measures 29cm length x 8.5cm width x 0.1cm depth; 193.601cm^2 area and 19.36cm^3 volume. There is Fat Layer (Subcutaneous Tissue) exposed. There is no tunneling or undermining noted. There is a large amount of serous drainage noted. There is large (67-100%) red, friable, hyper - granulation within the wound bed. There is a small (1-33%) amount of necrotic tissue within the wound bed including Adherent Slough. The periwound skin appearance exhibited: Scarring. Periwound temperature was noted as No Abnormality. Wound #6 status is Open. Original cause of wound was Gradually Appeared. The date acquired was: 01/15/2022. The wound is located on the Right Gluteus. The wound measures 21cm length x 5cm width x 0.1cm depth; 82.467cm^2 area and 8.247cm^3 volume. There is Fat Layer (Subcutaneous Tissue) exposed. There is no tunneling or undermining noted. There is a large amount of serous drainage  noted. There is large (67-100%) red, friable granulation within the wound bed. There is a small (1-33%) amount of necrotic tissue within the wound bed including Adherent Slough. The periwound skin appearance exhibited: Scarring. Periwound temperature was noted as No Abnormality. 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 Follow-up with Dermatology. Bathing/ Shower/ Hygiene: Other Bathing/Shower/Hygiene Orders/Instructions: - Wash with Hibiclens ( 4% Chlorhexidine CHG) and the use the Vasch. WOUND #5: - Gluteus Wound Laterality: Left Cleanser: Byram Ancillary Kit - 15 Day Supply (DME) (Generic) 1 x Per Day/30 Days Discharge Instructions: Use supplies as instructed; Kit contains: (15) Saline Bullets; (15) 3x3 Gauze; 15 pr Gloves Prim Dressing: KerraCel Ag Gelling Fiber Dressing, 4x5 in (silver alginate) (DME) (Generic) 1 x Per Day/30 Days ary Discharge Instructions: Apply silver alginate  to wound bed as instructed Secondary Dressing: ABD Pad, 8x10 (DME) (Generic) 1 x Per Day/30 Days Discharge Instructions: Apply over primary dressing as directed. Secondary Dressing: MPM Excel SAP Bordered Dressing, 7x6.7 (Sacral) (in/in) (DME) (Generic) 1 x Per Day/30 Days Discharge Instructions: Apply silicone border over primary dressing as directed. Donald [name], Donald [name] ([id number]) [id number] Page 6 of 8 Secondary Dressing: Optifoam Non-Adhesive Dressing, 4x4 in (DME) (Generic) 1 x Per Day/30 Days Discharge Instructions: Apply over primary dressing as directed. Secured With: 29M Medipore H Soft Cloth Surgical T ape, 4 x 10 (in/yd) 1 x Per Day/30 Days Discharge Instructions: Secure with tape as directed. WOUND #6: - Gluteus Wound Laterality: Right Cleanser: Byram Ancillary Kit - 15 Day Supply (DME)  (Generic) 1 x Per Day/30 Days Discharge Instructions: Use supplies as instructed; Kit contains: (15) Saline Bullets; (15) 3x3 Gauze; 15 pr Gloves Prim Dressing: KerraCel Ag Gelling Fiber Dressing, 4x5 in (silver alginate) (DME) (Generic) 1 x Per Day/30 Days ary Discharge Instructions: Apply silver alginate to wound bed as instructed Secondary Dressing: ABD Pad, 8x10 (DME) (Generic) 1 x Per Day/30 Days Discharge Instructions: Apply over primary dressing as directed. Secondary Dressing: MPM Excel SAP Bordered Dressing, 7x6.7 (Sacral) (in/in) (DME) (Generic) 1 x Per Day/30 Days Discharge Instructions: Apply silicone border over primary dressing as directed. Secondary Dressing: Optifoam Non-Adhesive Dressing, 4x4 in (DME) (Generic) 1 x Per Day/30 Days Discharge Instructions: Apply over primary dressing as directed. Secured With: 29M Medipore H Soft Cloth Surgical T ape, 4 x 10 (in/yd) (DME) (Generic) 1 x Per Day/30 Days Discharge Instructions: Secure with tape as directed. 04/18/2022: This is a 36 year old man with hidradenitis suppurativa. It appears that there may have been some confusion, based on the electronic medical record, that he had active pyoderma gangrenosum. This is not the case. He does have an appointment with dermatology on Monday, and I think that is the most appropriate setting to deal with his lesions. In the meantime, he should continue to take the prescribed oral antibiotic. I suggested washing the area with Hibiclens then using the Vashe and silver alginate. We helped him order some supplies. He may follow-up here on an as-needed basis. Electronic Signature(s) Signed: 04/18/2022 9:20:41 AM By: Donald Maudlin MD FACS Entered By: Donald [name] on 04/18/2022 09:20:40 -------------------------------------------------------------------------------- HxROS Details Patient Name: Date of Service: Donald [name], [place] RCUS [name]. 04/18/2022 8:15 A M Medical Record Number: [id number] Patient  Account Number: [id number] Date of Birth/Sex: Treating RN: 09-28-1985 (36 y.o. Collene Gobble Primary Care Provider: PA Donald [name], [place] Other Clinician: Referring Provider: Treating Provider/Extender: Donald [name] in Treatment: 0 Information Obtained From Patient Eyes Medical History: Negative for: Cataracts; Glaucoma; Optic Neuritis Ear/Nose/Mouth/Throat Medical History: Negative for: Chronic sinus problems/congestion; Middle ear problems Hematologic/Lymphatic Medical History: Negative for: Anemia; Hemophilia; Human Immunodeficiency Virus; Lymphedema; Sickle Cell Disease Respiratory Medical History: Negative for: Aspiration; Asthma; Chronic Obstructive Pulmonary Disease (COPD); Pneumothorax; Sleep Apnea; Tuberculosis Cardiovascular Medical History: Positive for: Congestive Heart Failure - On Entresto Negative for: Angina; Arrhythmia; Coronary Artery Disease; Deep Vein Thrombosis; Hypertension; Hypotension; Myocardial Infarction; Peripheral Arterial Disease; Peripheral Venous Disease; Phlebitis; Vasculitis Past Medical History Notes: Hx: Mitral Valve Repair Gastrointestinal Donald [name], Donald [name] ([id number]) [id number] Page 7 of 8 Medical History: Negative for: Cirrhosis ; Colitis; Crohns; Hepatitis A; Hepatitis B; Hepatitis C Endocrine Medical History: Negative for: Type I Diabetes; Type II Diabetes Genitourinary Medical History: Negative for: End Stage Renal Disease Immunological Medical History: Negative for: Lupus Erythematosus; Raynauds; Scleroderma Integumentary (Skin)  Medical History: Negative for: History of Burn Musculoskeletal Medical History: Negative for: Gout; Rheumatoid Arthritis; Osteoarthritis; Osteomyelitis Neurologic Medical History: Negative for: Dementia; Neuropathy; Quadriplegia; Paraplegia; Seizure Disorder Oncologic Medical History: Negative for: Received Chemotherapy; Received  Radiation Psychiatric Medical History: Negative for: Anorexia/bulimia; Confinement Anxiety Immunizations Pneumococcal Vaccine: Received Pneumococcal Vaccination: No Immunization Notes: last tetanus 2015 Implantable Devices Yes Hospitalization / Surgery History Type of Hospitalization/Surgery right shoulder repair IandD of bilateral lower leg wounds in February 2016 Mitral Valve repair Family and Social History Unknown History: Yes; Former smoker - February 2016; Marital Status - Single; Alcohol Use: Never; Drug Use: No History - marijuana; Caffeine Use: Rarely - soda; Financial Concerns: No; Food, Clothing or Shelter Needs: No; Support System Lacking: No; Transportation Concerns: No Electronic Signature(s) Signed: 04/18/2022 9:22:54 AM By: Donald Maudlin MD FACS Signed: 04/18/2022 5:55:24 PM By: Dellie Catholic RN Entered By: Dellie Catholic on 04/18/2022 08:16:00 Donald [name] [name] ([id number]) [id number] Page 8 of 8 -------------------------------------------------------------------------------- SuperBill Details Patient Name: Date of Service: [place] [name], Donald [place] RCUS [name]. 04/18/2022 Medical Record Number: [id number] Patient Account Number: [id number] Date of Birth/Sex: Treating RN: 1986/01/06 (36 y.o. 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

## 2022-04-19 NOTE — Progress Notes (Signed)
Donald [name], Donald [name] ([id number]) [id number] Page 1 of 10 Visit Report for 04/18/2022 Allergy List Details Patient Name: Date of Service: Donald [name], Donald [name]. 04/18/2022 8:15 A M Medical Record Number: [id number] Patient Account Number: [id number] Date of Birth/Sex: Treating RN: [birth year] (36 y.o. Collene Gobble Primary Care Keitra Carusone: PA [place], [place] Other Clinician: Referring Crystol Walpole: Treating Alanie Syler/Extender: Margaretmary Dys Weeks in Treatment: 0 Allergies Active Allergies No Known Allergies Allergy Notes Electronic Signature(s) Signed: 04/18/2022 5:55:24 PM By: Dellie Catholic RN Entered By: Dellie Catholic on 04/18/2022 08:09:15 -------------------------------------------------------------------------------- Arrival Information Details Patient Name: Date of Service: Donald [name], Donald [name]. 04/18/2022 8:15 A M Medical Record Number: [id number] Patient Account Number: [id number] Date of Birth/Sex: Treating RN: 1986-03-25 (36 y.o. Collene Gobble Primary Care Hailley Byers: PA [place], [place] Other Clinician: Referring Abiha Lukehart: Treating Anacleto Batterman/Extender: Laurian Brim in Treatment: 0 Visit Information Patient Arrived: Ambulatory Arrival Time: 08:01 Accompanied By: dad Transfer Assistance: None History Since Last Visit Added or deleted any medications: No Any new allergies or adverse reactions: No Had a fall or experienced change in activities of daily living that may affect risk of falls: No Signs or symptoms of abuse/neglect since last visito No Hospitalized since last visit: No Implantable device outside of the clinic excluding cellular tissue based products placed in the center since last visit: No Electronic Signature(s) Signed: 04/18/2022 5:55:24 PM By: Dellie Catholic RN Entered By: Dellie Catholic on 04/18/2022 08:08:27 Donald [name] ([id number]) [id number] Page 2 of  10 -------------------------------------------------------------------------------- Clinic Level of Care Assessment Details Patient Name: Date of Service: Donald [name], Donald [name]. 04/18/2022 8:15 A M Medical Record Number: [id number] Patient Account Number: [id number] Date of Birth/Sex: Treating RN: 07-09-1985 (36 y.o. Collene Gobble Primary Care Kamden Stanislaw: PA [place], [place] Other Clinician: Referring Shalese Strahan: Treating Cordarrel Stiefel/Extender: Margaretmary Dys Weeks in Treatment: 0 Clinic Level of Care Assessment Items TOOL 2 Quantity Score X- 1 0 Use when only an EandM is performed on the INITIAL visit ASSESSMENTS - Nursing Assessment / Reassessment X- 1 20 General Physical Exam (combine w/ comprehensive assessment (listed just below) when performed on new pt. evals) X- 1 25 Comprehensive Assessment (HX, ROS, Risk Assessments, Wounds Hx, etc.) ASSESSMENTS - Wound and Skin A ssessment / Reassessment _0  - 0 Simple Wound Assessment / Reassessment - one wound X- 2 5 Complex Wound Assessment / Reassessment - multiple wounds _1  - 0 Dermatologic / Skin Assessment (not related to wound area) ASSESSMENTS - Ostomy and/or Continence Assessment and Care _2  - 0 Incontinence Assessment and Management _3  - 0 Ostomy Care Assessment and Management (repouching, etc.) PROCESS - Coordination of Care _4  - 0 Simple Patient / Family Education for ongoing care X- 1 20 Complex (extensive) Patient / Family Education for ongoing care X- 1 10 Staff obtains Programmer, systems, Records, T Results / Process Orders est X- 1 10 Staff telephones HHA, Nursing Homes / Clarify orders / etc _5  - 0 Routine Transfer to another Facility (non-emergent condition) _6  - 0 Routine Hospital Admission (non-emergent condition) X- 1 15 New Admissions / Biomedical engineer / Ordering NPWT Apligraf, etc. , _7  - 0 Emergency Hospital Admission (emergent condition) X- 1 10 Simple Discharge Coordination _8  -  0 Complex (extensive) Discharge Coordination PROCESS - Special Needs _9  - 0 Pediatric / Minor Patient Management _10  - 0 Isolation Patient Management _11  - 0 Hearing / Language / Visual special needs _12  - 0 Assessment of Community assistance (transportation, D/C  planning, etc.) _0  - 0 Additional assistance / Altered mentation _1  - 0 Support Surface(s) Assessment (bed, cushion, seat, etc.) INTERVENTIONS - Wound Cleansing / Measurement X- 1 5 Wound Imaging (photographs - any number of wounds) _2  - 0 Wound Tracing (instead of photographs) _3  - 0 Simple Wound Measurement - one wound X- 2 5 Complex Wound Measurement - multiple wounds _4  - 0 Simple Wound Cleansing - one wound X- 2 5 Complex Wound Cleansing - multiple wounds INTERVENTIONS - Wound Dressings _5  - 0 Small Wound Dressing one or multiple wounds Donald [name], Donald [name] ([id number]) [id number] Page 3 of 10 X- 2 15 Medium Wound Dressing one or multiple wounds _6  - 0 Large Wound Dressing one or multiple wounds <[id number]>_1<\/[id number]>_4  - 0 Application of Medications - injection INTERVENTIONS - Miscellaneous _8  - 0 External ear exam _9  - 0 Specimen Collection (cultures, biopsies, blood, body fluids, etc.) _10  - 0 Specimen(s) / Culture(s) sent or taken to Lab for analysis _11  - 0 Patient Transfer (multiple staff / Civil Service fast streamer / Similar devices) _12  - 0 Simple Staple / Suture removal (25 or less) _13  - 0 Complex Staple / Suture removal (26 or more) _14  - 0 Hypo / Hyperglycemic Management (close monitor of Blood Glucose) _15  - 0 Ankle / Brachial Index (ABI) - do not check if billed separately Has the patient been seen at the hospital within the last three years: Yes Total Score: 175 Level Of Care: New/Established - Level 5 Electronic Signature(s) Signed: 04/18/2022 5:55:24 PM By: Dellie Catholic RN Entered By: Dellie Catholic on 04/18/2022  09:17:01 -------------------------------------------------------------------------------- Encounter Discharge Information Details Patient Name: Date of Service: Donald [name], [place] RCUS [name]. 04/18/2022 8:15 A M Medical Record Number: [id number] Patient Account Number: [id number] Date of Birth/Sex: Treating RN: 1986/05/06 (36 y.o. Collene Gobble Primary Care Sofi Bryars: PA [place], [place] Other Clinician: Referring Zeeshan Korte: Treating Aiya Keach/Extender: Laurian Brim in Treatment: 0 Encounter Discharge Information Items Discharge Condition: Stable Ambulatory Status: Ambulatory Discharge Destination: Home Transportation: Private Auto Accompanied By: son Schedule Follow-up Appointment: Yes Clinical Summary of Care: Patient Declined Electronic Signature(s) Signed: 04/18/2022 5:55:24 PM By: Dellie Catholic RN Entered By: Dellie Catholic on 04/18/2022 09:17:51 -------------------------------------------------------------------------------- Lower Extremity Assessment Details Patient Name: Date of Service: Donald [name], Donald [name]. 04/18/2022 8:15 A M Medical Record Number: [id number] Patient Account Number: [id number] Date of Birth/Sex: Treating RN: Mar 10, 1986 (36 y.o. Collene Gobble Primary Care Brenyn Petrey: PA [place], [place] Other Clinician: Referring Aidden Markovic: Treating Lane Eland/Extender: Laurian Brim in Treatment: [place], [place] ([id number]) [id number] Page 4 of 10 Electronic Signature(s) Signed: 04/18/2022 5:55:24 PM By: Dellie Catholic RN Entered By: Dellie Catholic on 04/18/2022 08:18:52 -------------------------------------------------------------------------------- Multi Wound Chart Details Patient Name: Date of Service: Donald [name], [place] [facility] [name]. 04/18/2022 8:15 A M Medical Record Number: [id number] Patient Account Number: [id number] Date of Birth/Sex: Treating RN: 28-Apr-1986 (36 y.o. M) Primary Care Yilin Weedon:  PA [place], [place] Other Clinician: Referring Lakasha Mcfall: Treating Jakerra Floyd/Extender: Margaretmary Dys Weeks in Treatment: 0 Vital Signs Height(in): 69 Pulse(bpm): Weight(lbs): 176 Blood Pressure(mmHg): Body Mass Index(BMI): 26 Temperature(F): Respiratory Rate(breaths/min): 16 [4:Photos: No Photos] Left, Lateral Lower Leg Left Gluteus Right Gluteus Wound Location: Gradually Appeared Gradually Appeared Gradually Appeared Wounding Event: Pyoderma Hidradenitis Hidradenitis Primary Etiology: Congestive Heart Failure Congestive Heart Failure Congestive Heart Failure Comorbid History: 02/05/2015 01/15/2022 01/15/2022 Date Acquired: 0 0 0 Weeks of Treatment: Healed - Epithelialized Open Open Wound Status: No No No Wound Recurrence: No Yes Yes  Clustered Wound: 0x0x0 29x8.5x0.1 21x5x0.1 Measurements L x W x D (cm) 0 193.601 82.467 A (cm) : rea 0 19.36 8.247 Volume (cm) : Full Thickness Without Exposed Full Thickness With Exposed Support Full Thickness With Exposed Support Classification: Support Structures Structures Structures N/A Large Large Exudate Amount: N/A Serous Serous Exudate Type: N/A amber amber Exudate Color: None Present (0%) Large (67-100%) Large (67-100%) Granulation Amount: N/A Red, Hyper-granulation, Friable Red, Friable Granulation Quality: None Present (0%) Small (1-33%) Small (1-33%) Necrotic Amount: Fascia: No Fat Layer (Subcutaneous Tissue): Yes Fat Layer (Subcutaneous Tissue): Yes Exposed Structures: Fat Layer (Subcutaneous Tissue): No Fascia: No Fascia: No Tendon: No Tendon: No Tendon: No Muscle: No Muscle: No Muscle: No Joint: No Joint: No Joint: No Bone: No Bone: No Bone: No Large (67-100%) N/A None Epithelialization: Scarring: Yes Scarring: Yes Periwound Skin Texture: N/A No Abnormality No Abnormality Temperature: Treatment Notes Electronic Signature(s) Signed: 04/18/2022 9:14:46 AM By: Fredirick Maudlin MD  FACS Entered By: Fredirick Maudlin on 04/18/2022 09:14:46 Donald [name] ([id number]) [id number] Page 5 of 10 -------------------------------------------------------------------------------- Multi-Disciplinary Care Plan Details Patient Name: Date of Service: Donald [name], Donald [name]. 04/18/2022 8:15 A M Medical Record Number: [id number] Patient Account Number: [id number] Date of Birth/Sex: Treating RN: Mar 27, 1986 (36 y.o. Collene Gobble Primary Care Valinda Fedie: PA [place], [place] Other Clinician: Referring Charolette Bultman: Treating Mauro Arps/Extender: Margaretmary Dys Weeks in Treatment: 0 Active Inactive Electronic Signature(s) Signed: 04/18/2022 5:55:24 PM By: Dellie Catholic RN Entered By: Dellie Catholic on 04/18/2022 09:08:58 -------------------------------------------------------------------------------- Pain Assessment Details Patient Name: Date of Service: Donald [name], [place] RC[facility] [name]. 04/18/2022 8:15 A M Medical Record Number: [id number] Patient Account Number: [id number] Date of Birth/Sex: Treating RN: 23-Mar-1986 (36 y.o. Collene Gobble Primary Care Merlene Dante: PA [place], [place] Other Clinician: Referring Donald [name]: Treating Ebert Forrester/Extender: Margaretmary Dys Weeks in Treatment: 0 Active Problems Location of Pain Severity and Description of Pain Patient Has Paino Yes Site Locations Pain Location: Generalized Pain With Dressing Change: No Duration of the Pain. Constant / Intermittento Constant Rate the pain. Current Pain Level: 7 Worst Pain Level: 10 Least Pain Level: 3 Tolerable Pain Level: 5 Character of Pain Describe the Pain: Difficult to Pinpoint Pain Management and Medication Current Pain Management: Medication: Yes Cold Application: No Rest: Yes Massage: No Activity: No T.E.N.S.: No Heat Application: No Leg drop or elevation: No Donald [name], Donald [name] ([id number]) [id number] Page 6 of  10 Is the Current Pain Management Adequate: Adequate How does your wound impact your activities of daily livingo Sleep: No Bathing: No Appetite: No Relationship With Others: No Bladder Continence: No Emotions: No Bowel Continence: No Work: No Toileting: No Drive: No Dressing: No Hobbies: No Electronic Signature(s) Signed: 04/18/2022 5:55:24 PM By: Dellie Catholic RN Entered By: Dellie Catholic on 04/18/2022 09:08:04 -------------------------------------------------------------------------------- Patient/Caregiver Education Details Patient Name: Date of Service: Donald [name], [place]. 11/3/2023andnbsp8:15 A M Medical Record Number: [id number] Patient Account Number: [id number] Date of Birth/Gender: Treating RN: 18-Nov-1985 (36 y.o. Collene Gobble Primary Care Physician: PA [place], [place] Other Clinician: Referring Physician: Treating Physician/Extender: Laurian Brim in Treatment: 0 Education Assessment Education Provided To: Patient Education Topics Provided Wound/Skin Impairment: Methods: Explain/Verbal Responses: Return demonstration correctly Electronic Signature(s) Signed: 04/18/2022 5:55:24 PM By: Dellie Catholic RN Entered By: Dellie Catholic on 04/18/2022 09:15:04 -------------------------------------------------------------------------------- Wound Assessment Details Patient Name: Date of Service: Donald [name], [place] [name]. 04/18/2022 8:15 A M Medical Record Number: [id number] Patient Account Number: [id number] Date of Birth/Sex:  Treating RN: 17-Dec-1985 (36 y.o. Collene Gobble Primary Care Kaliel Bolds: PA [place], [place] Other Clinician: Referring Rylynn Schoneman: Treating Franciszek Platten/Extender: Margaretmary Dys Weeks in Treatment: 0 Wound Status Wound Number: 4 Primary Etiology: Pyoderma Wound Location: Left, Lateral Lower Leg Wound Status: Healed - Epithelialized Wounding Event: Gradually Appeared Comorbid History: Congestive Heart  Failure Date Acquired: 02/05/2015 Weeks Of Treatment: 0 Clustered Wound: No Wound Measurements Donald [name], Donald [name] ([id number]) Length: (cm) Width: (cm) Depth: (cm) Area: (cm) Volume: (cm) [id number] Page 7 of 10 0 % Reduction in Area: 0 % Reduction in Volume: 0 Epithelialization: Large (67-100%) 0 Tunneling: No 0 Undermining: No Wound Description Classification: Full Thickness Without Exposed Support Structures Foul Odor After Cleansing: No Slough/Fibrino No Wound Bed Granulation Amount: None Present (0%) Exposed Structure Necrotic Amount: None Present (0%) Fascia Exposed: No Fat Layer (Subcutaneous Tissue) Exposed: No Tendon Exposed: No Muscle Exposed: No Joint Exposed: No Bone Exposed: No Periwound Skin Texture Texture Color No Abnormalities Noted: No No Abnormalities Noted: No Moisture No Abnormalities Noted: No Electronic Signature(s) Signed: 04/18/2022 5:55:24 PM By: Dellie Catholic RN Entered By: Dellie Catholic on 04/18/2022 08:20:23 -------------------------------------------------------------------------------- Wound Assessment Details Patient Name: Date of Service: Donald [name], [place] [name]. 04/18/2022 8:15 A M Medical Record Number: [id number] Patient Account Number: [id number] Date of Birth/Sex: Treating RN: [birth year] (36 y.o. Collene Gobble Primary Care Shaquila Sigman: PA [place], [place] Other Clinician: Referring Sanaya Gwilliam: Treating Gerry Blanchfield/Extender: Margaretmary Dys Weeks in Treatment: 0 Wound Status Wound Number: 5 Primary Etiology: Hidradenitis Wound Location: Left Gluteus Wound Status: Healed - Epithelialized Wounding Event: Gradually Appeared Comorbid History: Congestive Heart Failure Date Acquired: 01/15/2022 Weeks Of Treatment: 0 Clustered Wound: Yes Photos Wound Measurements Length: (cm) Width: (cm) Donald [name], Donald [name] ([id number]) Depth: (cm) Area: (cm) Volume: (cm) 0 % Reduction in Area: 0 %  Reduction in Volume: [id number] Page 8 of 10 0 Epithelialization: Large (67-100%) 0 Tunneling: No 0 Undermining: No Wound Description Classification: Full Thickness With Exposed Support Structures Exudate Amount: None Present Foul Odor After Cleansing: No Slough/Fibrino No Wound Bed Granulation Amount: None Present (0%) Exposed Structure Necrotic Amount: None Present (0%) Fascia Exposed: No Fat Layer (Subcutaneous Tissue) Exposed: No Tendon Exposed: No Muscle Exposed: No Joint Exposed: No Bone Exposed: No Periwound Skin Texture Texture Color No Abnormalities Noted: No No Abnormalities Noted: No Scarring: Yes Temperature / Pain Temperature: No Abnormality Moisture No Abnormalities Noted: No Treatment Notes Wound #5 (Gluteus) Wound Laterality: Left Cleanser Byram Ancillary Kit - 15 Day Supply Discharge Instruction: Use supplies as instructed; Kit contains: (15) Saline Bullets; (15) 3x3 Gauze; 15 pr Gloves Peri-Wound Care Topical Primary Dressing KerraCel Ag Gelling Fiber Dressing, 4x5 in (silver alginate) Discharge Instruction: Apply silver alginate to wound bed as instructed Secondary Dressing ABD Pad, 8x10 Discharge Instruction: Apply over primary dressing as directed. MPM Excel SAP Bordered Dressing, 7x6.7 (Sacral) (in/in) Discharge Instruction: Apply silicone border over primary dressing as directed. Optifoam Non-Adhesive Dressing, 4x4 in Discharge Instruction: Apply over primary dressing as directed. Secured With 38M Medipore H Soft Cloth Surgical T ape, 4 x 10 (in/yd) Discharge Instruction: Secure with tape as directed. Compression Wrap Compression Stockings Add-Ons Electronic Signature(s) Signed: 04/18/2022 5:55:24 PM By: Dellie Catholic RN Entered By: Dellie Catholic on 04/18/2022 17:32:20 -------------------------------------------------------------------------------- Wound Assessment Details Patient Name: Date of Service: Donald Bishop  [name], [place] [facility] [name]. 04/18/2022 8:15 A Donald [name], Donald [name] ([id number]) [id number] Page 9 of 10 Medical Record Number: [id number] Patient Account Number: [id number] Date of Birth/Sex: Treating RN:  05-Dec-1985 (36 y.o. Collene Gobble Primary Care Emilyann Banka: PA [place], [place] Other Clinician: Referring Jaylynn Siefert: Treating Herold Salguero/Extender: Margaretmary Dys Weeks in Treatment: 0 Wound Status Wound Number: 6 Primary Etiology: Hidradenitis Wound Location: Right Gluteus Wound Status: Healed - Epithelialized Wounding Event: Gradually Appeared Comorbid History: Congestive Heart Failure Date Acquired: 01/15/2022 Weeks Of Treatment: 0 Clustered Wound: Yes Photos Wound Measurements Length: (cm) Width: (cm) Depth: (cm) Area: (cm) Volume: (cm) 0 % Reduction in Area: 0 % Reduction in Volume: 0 Epithelialization: Large (67-100%) 0 Tunneling: No 0 Undermining: No Wound Description Classification: Full Thickness With Exposed Support Structures Exudate Amount: Large Exudate Type: Serous Exudate Color: amber Foul Odor After Cleansing: No Slough/Fibrino No Wound Bed Granulation Amount: None Present (0%) Exposed Structure Necrotic Amount: None Present (0%) Fascia Exposed: No Fat Layer (Subcutaneous Tissue) Exposed: No Tendon Exposed: No Muscle Exposed: No Joint Exposed: No Bone Exposed: No Periwound Skin Texture Texture Color No Abnormalities Noted: No No Abnormalities Noted: No Scarring: Yes Temperature / Pain Temperature: No Abnormality Moisture No Abnormalities Noted: No Treatment Notes Wound #6 (Gluteus) Wound Laterality: Right Cleanser Byram Ancillary Kit - 15 Day Supply Discharge Instruction: Use supplies as instructed; Kit contains: (15) Saline Bullets; (15) 3x3 Gauze; 15 pr Gloves Peri-Wound Care Topical Primary Dressing KerraCel Ag Gelling Fiber Dressing, 4x5 in (silver alginate) Discharge Instruction: Apply silver alginate to  wound bed as instructed Donald [name], SARIC [name] ([id number]) [id number] Page 10 of 10 Secondary Dressing ABD Pad, 8x10 Discharge Instruction: Apply over primary dressing as directed. MPM Excel SAP Bordered Dressing, 7x6.7 (Sacral) (in/in) Discharge Instruction: Apply silicone border over primary dressing as directed. Optifoam Non-Adhesive Dressing, 4x4 in Discharge Instruction: Apply over primary dressing as directed. Secured With 36M Medipore H Soft Cloth Surgical T ape, 4 x 10 (in/yd) Discharge Instruction: Secure with tape as directed. Compression Wrap Compression Stockings Add-Ons Electronic Signature(s) Signed: 04/18/2022 5:55:24 PM By: Dellie Catholic RN Entered By: Dellie Catholic on 04/18/2022 17:34:40 -------------------------------------------------------------------------------- Vitals Details Patient Name: Date of Service: Donald [name], [place] [facility] [name]. 04/18/2022 8:15 A M Medical Record Number: [id number] Patient Account Number: [id number] Date of Birth/Sex: Treating RN: Oct 16, 1985 (36 y.o. Collene Gobble Primary Care Basia Mcginty: PA [place], [place] Other Clinician: Referring Deairra Halleck: Treating Caryle Helgeson/Extender: Margaretmary Dys Weeks in Treatment: 0 Vital Signs Time Taken: 08:06 Respiratory Rate (breaths/min): 16 Height (in): 69 Reference Range: 80 - 120 mg / dl Weight (lbs): 176 Body Mass Index (BMI): 26 Electronic Signature(s) Signed: 04/18/2022 5:55:24 PM By: Dellie Catholic RN Entered By: Dellie Catholic on 04/18/2022 08:09:00

## 2022-04-19 NOTE — Progress Notes (Signed)
Donald [name], Donald [name] ([id number]) [phone].pdf Page 1 of 4 Visit Report for 04/18/2022 Abuse Risk Screen Details Patient Name: Date of Service: Donald [name], [place] [facility] [name]. 04/18/2022 8:15 A M Medical Record Number: [id number] Patient Account Number: [id number] Date of Birth/Sex: Treating [name]: 08-Feb-1986 (36 y.o. Donald [name] Primary Care Donald [name]: PA Donald [name], [place] Other Clinician: Referring Donald [name]: Treating Donald [name]/Extender: Donald [name] Weeks in Treatment: 0 Abuse Risk Screen Items Answer ABUSE RISK SCREEN: Has anyone close to you tried to hurt or harm you recentlyo No Do you feel uncomfortable with anyone in your familyo No Has anyone forced you do things that you didnt want to doo No Electronic Signature(s) Signed: 04/18/2022 5:55:24 PM By: Donald [name] Entered By: Donald Catholic on 04/18/2022 08:16:08 -------------------------------------------------------------------------------- Activities of Daily Living Details Patient Name: Date of Service: Donald [name], [place] [name]. 04/18/2022 8:15 A M Medical Record Number: [id number] Patient Account Number: [id number] Date of Birth/Sex: Treating [name]: 10/15/1985 (36 y.o. Donald [name] Primary Care Donald [name]: PA Donald [name], [place] Other Clinician: Referring Donald [name]: Treating Donald [name]/Extender: Donald [name] Weeks in Treatment: 0 Activities of Daily Living Items Answer Activities of Daily Living (Please select one for each item) Drive Automobile Not Able T Medications ake Completely Able Use T elephone Completely Able Care for Appearance Need Assistance Use T oilet Completely Able Bath / Shower Need Assistance Dress Self Need Assistance Feed Self Completely Able Walk Completely Able Get In / Out Bed Completely Able Housework Not Able Prepare Meals Need Assistance Handle Money Need Assistance Shop for Self Completely Able Electronic Signature(s) Signed:  04/18/2022 5:55:24 PM By: Donald [name] Entered By: Donald Catholic on 04/18/2022 08:17:06 Donald [name] ([id number]) [id number] Page 2 of 4 -------------------------------------------------------------------------------- Education Screening Details Patient Name: Date of Service: Donald [name], [place] [facility] [name]. 04/18/2022 8:15 A M Medical Record Number: [id number] Patient Account Number: [id number] Date of Birth/Sex: Treating [name]: 22-Jul-1985 (36 y.o. Donald [name] Primary Care Donald [name]: PA Donald [name], [place] Other Clinician: Referring Donald [name]: Treating Donald [name]/Extender: Donald [name] in Treatment: 0 Learning Preferences/Education Level/Primary Language Learning Preference: Explanation, Demonstration, Video, Printed Material Highest Education Level: College or Above Preferred Language: English Cognitive Barrier Language Barrier: No Translator Needed: No Memory Deficit: No Emotional Barrier: No Cultural/Religious Beliefs Affecting Medical Care: No Physical Barrier Impaired Vision: No Impaired Hearing: No Decreased Hand dexterity: No Knowledge/Comprehension Knowledge Level: High Comprehension Level: High Ability to understand written instructions: High Ability to understand verbal instructions: High Motivation Anxiety Level: Calm Cooperation: Cooperative Education Importance: Acknowledges Need Interest in Health Problems: Asks Questions Perception: Coherent Willingness to Engage in Self-Management High Activities: Readiness to Engage in Self-Management High Activities: Electronic Signature(s) Signed: 04/18/2022 5:55:24 PM By: Donald [name] Entered By: Donald Catholic on 04/18/2022 08:18:02 -------------------------------------------------------------------------------- Fall Risk Assessment Details Patient Name: Date of Service: Donald [name], [name]. 04/18/2022 8:15 A M Medical Record Number:  [id number] Patient Account Number: [id number] Date of Birth/Sex: Treating [name]: 04-Dec-1985 (36 y.o. Donald [name] Primary Care Donald [name]: PA [place], [place] Other Clinician: Referring Donald [name]: Treating Donald [name]/Extender: Donald [name] Weeks in Treatment: 0 Fall Risk Assessment Items Have you had 2 or more falls in the last 12 monthso 0 No Have you had any fall that resulted in injury in the last 12 monthso 0 No Donald [name], Donald [name] ([id number]) [id number] Page 3 of 4 FALLS RISK SCREEN History of falling - immediate or within 3 months 0 No  Secondary diagnosis (Do you have 2 or more medical diagnoseso) 0 No Ambulatory aid None/bed rest/wheelchair/nurse 0 No Crutches/cane/walker 0 No Furniture 0 No Intravenous therapy Access/Saline/Heparin Lock 0 No Gait/Transferring Normal/ bed rest/ wheelchair 0 No Weak (short steps with or without shuffle, stooped but able to lift head while walking, may seek 0 No support from furniture) Impaired (short steps with shuffle, may have difficulty arising from chair, head down, impaired 0 No balance) Mental Status Oriented to own ability 0 No Electronic Signature(s) Signed: 04/18/2022 5:55:24 PM By: Donald [name] Entered By: Donald Catholic on 04/18/2022 08:18:22 -------------------------------------------------------------------------------- Foot Assessment Details Patient Name: Date of Service: Donald [name], [place] RCUS [name]. 04/18/2022 8:15 A M Medical Record Number: [id number] Patient Account Number: [id number] Date of Birth/Sex: Treating [name]: 09-24-1985 (36 y.o. Donald [name] Primary Care Donald [name]: PA [place], [place] Other Clinician: Referring Donald [name]: Treating Donald [name]/Extender: Donald [name] Weeks in Treatment: 0 Foot Assessment Items Site Locations + = Sensation present, - = Sensation absent, C = Callus, U = Ulcer [name] = Redness, W = Warmth, M = Maceration, PU = Pre-ulcerative  lesion F = Fissure, S = Swelling, D = Dryness Assessment Right: Left: Other Deformity: No No Prior Foot Ulcer: No No Prior Amputation: No No Charcot Joint: No No Ambulatory Status: Ambulatory Without Help GaitARYAMAN, NIMZ [name] ([id number]) [id number] Page 4 of 4 Electronic Signature(s) Signed: 04/18/2022 5:55:24 PM By: Donald [name] Entered By: Donald Catholic on 04/18/2022 08:18:45 -------------------------------------------------------------------------------- Nutrition Risk Screening Details Patient Name: Date of Service: [place] [name], [place] RCUS [name]. 04/18/2022 8:15 A M Medical Record Number: [id number] Patient Account Number: [id number] Date of Birth/Sex: Treating [name]: 03-23-1986 (36 y.o. Donald [name] Primary Care Manuela Halbur: PA Donald [name], NO Other Clinician: Referring Brittiney Dicostanzo: Treating Collins Kerby/Extender: Donald [name] Weeks in Treatment: 0 Height (in): 69 Weight (lbs): 176 Body Mass Index (BMI): 26 Nutrition Risk Screening Items Score Screening NUTRITION RISK SCREEN: I have an illness or condition that made me change the kind and/or amount of food I eat 0 No I eat fewer than two meals per day 0 No I eat few fruits and vegetables, or milk products 0 No I have three or more drinks of beer, liquor or wine almost every day 0 No I have tooth or mouth problems that make it hard for me to eat 0 No I don't always have enough money to buy the food I need 0 No I eat alone most of the time 0 No I take three or more different prescribed or over-the-counter drugs a day 0 No Without wanting to, I have lost or gained 10 pounds in the last six months 0 No I am not always physically able to shop, cook and/or feed myself 0 No Nutrition Protocols Good Risk Protocol 0 No interventions needed Moderate Risk Protocol High Risk Proctocol Risk Level: Good Risk Score: 0 Electronic Signature(s) Signed: 04/18/2022 5:55:24 PM By:  Donald [name] Entered By: Donald Catholic on 04/18/2022 08:18:29

## 2022-04-21 DIAGNOSIS — L732 Hidradenitis suppurativa: Secondary | ICD-10-CM | POA: Diagnosis not present

## 2022-04-22 ENCOUNTER — Ambulatory Visit (HOSPITAL_COMMUNITY)
Admission: RE | Admit: 2022-04-22 | Discharge: 2022-04-22 | Disposition: A | Discharge status: Home / Self Care | Payer: Medicaid Other | Source: Ambulatory Visit | Attending: Family Medicine | Admitting: Family Medicine

## 2022-04-22 ENCOUNTER — Encounter (HOSPITAL_COMMUNITY): Payer: Self-pay

## 2022-04-22 VITALS — BP 92/70 | HR 84 | Wt 182.0 lb

## 2022-04-22 DIAGNOSIS — Z7901 Long term (current) use of anticoagulants: Secondary | ICD-10-CM | POA: Insufficient documentation

## 2022-04-22 DIAGNOSIS — Z79899 Other long term (current) drug therapy: Secondary | ICD-10-CM | POA: Diagnosis not present

## 2022-04-22 DIAGNOSIS — Z7982 Long term (current) use of aspirin: Secondary | ICD-10-CM | POA: Diagnosis not present

## 2022-04-22 DIAGNOSIS — S40022S Contusion of left upper arm, sequela: Secondary | ICD-10-CM

## 2022-04-22 DIAGNOSIS — D8989 Other specified disorders involving the immune mechanism, not elsewhere classified: Secondary | ICD-10-CM | POA: Diagnosis not present

## 2022-04-22 DIAGNOSIS — R5381 Other malaise: Secondary | ICD-10-CM

## 2022-04-22 DIAGNOSIS — I110 Hypertensive heart disease with heart failure: Secondary | ICD-10-CM | POA: Insufficient documentation

## 2022-04-22 DIAGNOSIS — I480 Paroxysmal atrial fibrillation: Secondary | ICD-10-CM | POA: Insufficient documentation

## 2022-04-22 DIAGNOSIS — Z8673 Personal history of transient ischemic attack (TIA), and cerebral infarction without residual deficits: Secondary | ICD-10-CM

## 2022-04-22 DIAGNOSIS — L732 Hidradenitis suppurativa: Secondary | ICD-10-CM | POA: Insufficient documentation

## 2022-04-22 DIAGNOSIS — Z7984 Long term (current) use of oral hypoglycemic drugs: Secondary | ICD-10-CM | POA: Diagnosis not present

## 2022-04-22 DIAGNOSIS — Z952 Presence of prosthetic heart valve: Secondary | ICD-10-CM | POA: Insufficient documentation

## 2022-04-22 DIAGNOSIS — L88 Pyoderma gangrenosum: Secondary | ICD-10-CM | POA: Insufficient documentation

## 2022-04-22 DIAGNOSIS — I4892 Unspecified atrial flutter: Secondary | ICD-10-CM | POA: Diagnosis not present

## 2022-04-22 DIAGNOSIS — I5082 Biventricular heart failure: Secondary | ICD-10-CM | POA: Diagnosis not present

## 2022-04-22 DIAGNOSIS — I69354 Hemiplegia and hemiparesis following cerebral infarction affecting left non-dominant side: Secondary | ICD-10-CM | POA: Diagnosis not present

## 2022-04-22 DIAGNOSIS — I5022 Chronic systolic (congestive) heart failure: Secondary | ICD-10-CM | POA: Diagnosis not present

## 2022-04-22 LAB — CBC
HCT: 26.1 % — ABNORMAL LOW (ref 39.0–52.0)
Hemoglobin: 7.9 g/dL — ABNORMAL LOW (ref 13.0–17.0)
MCH: 28 pg (ref 26.0–34.0)
MCHC: 30.3 g/dL (ref 30.0–36.0)
MCV: 92.6 fL (ref 80.0–100.0)
Platelets: 519 10*3/uL — ABNORMAL HIGH (ref 150–400)
RBC: 2.82 MIL/uL — ABNORMAL LOW (ref 4.22–5.81)
RDW: 18.3 % — ABNORMAL HIGH (ref 11.5–15.5)
WBC: 9.5 10*3/uL (ref 4.0–10.5)
nRBC: 0 % (ref 0.0–0.2)

## 2022-04-22 LAB — COMPREHENSIVE METABOLIC PANEL WITH GFR
ALT: 12 U/L (ref 0–44)
AST: 24 U/L (ref 15–41)
Albumin: 1.8 g/dL — ABNORMAL LOW (ref 3.5–5.0)
Alkaline Phosphatase: 131 U/L — ABNORMAL HIGH (ref 38–126)
Anion gap: 7 (ref 5–15)
BUN: 14 mg/dL (ref 6–20)
CO2: 23 mmol/L (ref 22–32)
Calcium: 8.7 mg/dL — ABNORMAL LOW (ref 8.9–10.3)
Chloride: 107 mmol/L (ref 98–111)
Creatinine, Ser: 1.15 mg/dL (ref 0.61–1.24)
GFR, Estimated: >60 mL/min
Glucose, Bld: 107 mg/dL — ABNORMAL HIGH (ref 70–99)
Potassium: 3.3 mmol/L — ABNORMAL LOW (ref 3.5–5.1)
Sodium: 137 mmol/L (ref 135–145)
Total Bilirubin: 0.3 mg/dL (ref 0.3–1.2)
Total Protein: 8.9 g/dL — ABNORMAL HIGH (ref 6.5–8.1)

## 2022-04-22 LAB — DIGOXIN LEVEL: Digoxin Level: <0.2 ng/mL — ABNORMAL LOW (ref 0.8–2.0)

## 2022-04-22 LAB — BRAIN NATRIURETIC PEPTIDE: B Natriuretic Peptide: 479.5 pg/mL — ABNORMAL HIGH (ref 0.0–100.0)

## 2022-04-22 MED ORDER — ENTRESTO 24-26 MG PO TABS: 1.0000 | ORAL_TABLET | Freq: Two times a day (BID) | ORAL | 7 refills | Status: DC

## 2022-04-22 NOTE — Patient Instructions (Signed)
Thank you for coming in today  Labs were done today, if any labs are abnormal the clinic will call you No news is good news  DECREASE Entresto to 24/26 mg 1 tablet twice daily   Your physician has requested that you have an echocardiogram. Echocardiography is a painless test that uses sound waves to create images of your heart. It provides your doctor with information about the size and shape of your heart and how well your heart's chambers and valves are working. This procedure takes approximately one hour. There are no restrictions for this procedure.   You have bee nrefeered to neurology Dr. Erlinda Hong and they will call you for further appointment details.  Your physician recommends that you schedule a follow-up appointment in:  3-4 months with Dr. Haroldine Laws. You will receive a reminder letter in the mail a few months in advance. If you don't receive a letter, please call our office to schedule the follow-up appointment.    Do the following things EVERYDAY: Weigh yourself in the morning before breakfast. Write it down and keep it in a log. Take your medicines as prescribed Eat low salt foods--Limit salt (sodium) to 2000 mg per day.  Stay as active as you can everyday Limit all fluids for the day to less than 2 liters  At the [facility], you and your health needs are our priority. As part of our continuing mission to provide you with exceptional heart care, we have created designated Provider Care Teams. These Care Teams include your primary Cardiologist (physician) and Advanced Practice Providers (APPs- Physician Assistants and Nurse Practitioners) who all work together to provide you with the care you need, when you need it.   You may see any of the following providers on your designated Care Team at your next follow up: Dr Glori Bickers Dr Loralie Champagne Dr. Roxana Hires, NP Lyda Jester, [place] [facility] [place], [place] Forestine Na,  NP Audry Riles, PharmD   Please be sure to bring in all your medications bottles to every appointment.   If you have any questions or concerns before your next appointment please send [place] a message through [place] or call our office at [phone].    TO LEAVE A MESSAGE FOR THE NURSE SELECT OPTION 2, PLEASE LEAVE A MESSAGE INCLUDING: YOUR NAME DATE OF BIRTH CALL BACK NUMBER REASON FOR CALL**this is important as we prioritize the call backs  YOU WILL RECEIVE A CALL BACK THE SAME DAY AS LONG AS YOU CALL BEFORE 4:00 PM

## 2022-04-22 NOTE — Progress Notes (Signed)
Advanced Heart Failure Clinic Note   PCP: Patient, No Pcp Per Primary Cardiologist: Donald Dolly, MD  [facility]: Dr. Haroldine Laws   HPI: Donald [name] is a 36 y.o. male with history of mitral regurgitation (s/p MV repair with resection of ruptured anterior papillary muscle and reconstruction of papillary chord and placement of annuloplasty ring in 2019). Had recurrent MR and underwent MVR w/ mechanical valve on 06/10/21 by Dr. Wannetta Sender at [facility].   Admitted 12/22 for a/c CHF and evidence of low output and AKI. Echo with EF 20-25% with severe MR/mod MS and severe RV dysfunction. PICC place, started on empiric milrinone. AKI resolved with addition of milrinone. He underwent MVR w/ mechanical valve on 12/26 by Dr. Wannetta Sender at [facility]. He was discharged on 1/30 on warfarin.  Admitted 2/23 with low output HF in setting of AF. Echo showed LVEF <20%, RV severely reduced. Mechanical MV ok, Gradient 3 mmHg. Started on milrinone and amio gtt. Diuresed w/ IV Lasix. Diuresed and extubated. Converted to NSR, amio and milrinone weaned off. GDMT titrated. Discharged home, weight was 198 lb.   Echo 10/11/21: EF 25%. Mild RV dysfunction.  Admitted 7/23 with a/c CHF and cardiogenic shock. Echo showed EF 15%, RV severely down, MVR thick, mean gradient 5-6. Intubated, and started on NE and milrinone. TEE showed severe biventricular failure with vegetation on mechanical MVR. Able to wean gtts and extubate. Unfortunately, CODE stroke called on 01/09/22, MRI w/ several small acute right MCA territory ischemic strokes, suggestive of a cardioembolic etiology.  UE CT concerning for cellulitis vs pyomyositis. 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  Ortho consulted and rec transfer to [facility] for Ortho Onc. GDMT titrated and he was transferred to [facility] for Ortho Onc consultation, as well as to discuss potential heart transplant candidacy. While at [facility], UE mass found to be hematoma. Kidney function  declined and he was transiently on hemodialysis. He was eventually discharged home with his father.  Seen in ED 04/05/22 with palpitations. Found to be in SVT vs AFL, and hypotensive. Underwent emergent DCCV to NSR, BP improved.  Today he returns for HF follow up with his father. Overall feeling OK. Not very active but not SOB with ADLs or walking on flat ground. Following with Derm hidradenitis suppurativa. Has wound on his bottom that is healing. Denies palpitaitons, abnormal bleeding, CP, dizziness, edema, or PND/Orthopnea. Appetite ok. No fever or chills. He is not weighing at home. Taking all medications. INR checked at [name] in [place]. 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 He has not had follow up at [facility]. Has a 70 year old son.  Cardiac Studies:  - TEE (7/23): LVEF < 20%, RV severely reduced, mean mitral valve gradient 8 mmHg with trivial MR + vegetation on MV  - Echo (7/23): EF < 15%, RV severely down  - Echo (4/23): EF 235%, mild RV dysfunction  - Echo (2/23): EF < 20%, severe LV dysfunction with global HK, grade II DD, RV severely reduced, S/p mitral valve repair. MV mean gradient 3 mmHg   - Echo (12/22): EF 20-25%, severe LV dysfunction with global HK, mild LVH, RV  moderately reduced, elevated MV gradient 68mmHg, mild AI  - R/LHC (09/11/20):   RA = 7 RV = 49/10 PA = 48/16 (32) PCW = 17 (v=32) Fick cardiac output/index = 5.3/2.5 PVR = 2.9 WU Ao sat = 99% PA sat = 71%, 70% High SVC sat =  75%  1. Normal coronary arteries 2. NICM EF  30-35% 3. Severe MR with prominent v-waves in PCWP tracing 4. 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 EF 35-40% by echo in 07/2018 b. EF at 45% by repeat echo in 04/2020   DVT (deep venous thrombosis) (HCC)    h/o   Dysrhythmia    Mitral regurgitation     a. 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 b. 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HCC)    arthritis   Shock, septic and cardiogenic 12/28/2021   Tricuspid regurgitation     Current Outpatient Medications  Medication Sig Dispense Refill   acetaminophen (TYLENOL) 325 MG tablet Take 2 tablets (650 mg total) by mouth every 4 (four) hours as needed for headache or mild pain.     amiodarone (PACERONE) 200 MG tablet Take 1 tablet (200 mg total) by mouth daily. 30 tablet 1   aspirin 81 MG chewable tablet Chew 1 tablet (81 mg total) by mouth daily. 30 tablet 1   carvedilol (COREG) 3.125 MG tablet Take 1 tablet (3.125 mg total) by mouth 2 (two) times daily with a meal.     dapagliflozin propanediol (FARXIGA) 10 MG TABS tablet Take 1 tablet (10 mg total) by mouth daily. 30 tablet 1   digoxin (LANOXIN) 0.125 MG tablet Take 1 tablet (0.125 mg total) by mouth daily. 30 tablet 1   feeding supplement (ENSURE ENLIVE / ENSURE PLUS) LIQD Take 237 mLs by mouth 3 (three) times daily between meals. 237 mL 12   ferrous sulfate 325 (65 FE) MG tablet Take 1 tablet (325 mg total) by mouth every other day.  3   furosemide (LASIX) 40 MG tablet Patient takes 1 tablet by mouth on Monday Wednesday and Fridays.     rosuvastatin (CRESTOR) 40 MG tablet Take 1 tablet (40 mg total) by mouth daily.     sacubitril-valsartan (ENTRESTO) 49-51 MG Take 1 tablet by mouth 2 (two) times daily. 60 tablet    warfarin (COUMADIN) 7.5 MG tablet Patient takes 1 tablet by mouth daily.     No current facility-administered medications for this encounter.    No Known Allergies    Social History   Socioeconomic History   Marital status: Single    Spouse name: Not on file   Number of children: Not on file    Years of education: Not on file   Highest education level: Not on file  Occupational History   Not on file  Tobacco Use   Smoking status: Former    Types: Cigarettes    Quit date: 10/14/2020    Years since quitting: 1.5   Smokeless tobacco: Never  Vaping Use   Vaping Use: Never used  Substance and Sexual Activity   Alcohol use: No   Drug use: No   Sexual activity: Yes  Other Topics Concern   Not on file  Social History Narrative   Not on file   Social Determinants of Health   Financial Resource Strain: Not on file  Food Insecurity: Not on file  Transportation Needs: Not on file  Physical Activity: Not on file  Stress: Not on file  Social Connections: Not on file  Intimate Partner Violence: Not on file   Family History  Problem Relation Age of Onset   Multiple sclerosis Mother    Psoriasis Mother    Depression Father    Diabetes Father    Diabetes Paternal Grandmother    BP 92/70   Pulse 84   Wt 82.6 kg (182 lb)   SpO2 100%   BMI 26.11 kg/m   Wt Readings from Last 3 Encounters:  04/22/22 82.6 kg (182 lb)  04/04/22 89.7 kg (197 lb 12 oz)  01/17/22 89.7 kg (197 lb 12 oz)   PHYSICAL EXAM: General:  NAD. No resp difficulty, walked into clinic, chronically-ill appearing. HEENT: Normal Neck: Supple. No JVD. Carotids 2+ bilat; no bruits. No lymphadenopathy or thryomegaly appreciated. Cor: PMI nondisplaced. Regular rate & rhythm. No rubs, gallops or murmurs. Lungs: Clear Abdomen: Soft, nontender, nondistended. No hepatosplenomegaly. No bruits or masses. Good bowel sounds. Extremities: No cyanosis, clubbing, rash, edema Neuro: Alert & oriented x 3, cranial nerves grossly intact. Left forearm flaccid. Affect pleasant.  ECG: NSR 90 bpm (personally reviewed).  ASSESSMENT & PLAN: 1. Chronic HFrEF/Biventricular Failure - He has known biventricular failure - Echo (4/23): EF of 20-25% and RV function was severely reduced.  - Echo (7/23): EF 10% RV severely HK.  Cardiogenic/septic shock, required NE and milrinone. - TEE (7/23) showed severe BiV failure + vegetation on mechanical MVR. - Today, improved NYHA II, though suspect he is not very active. Volume look ok. - Decrease Entresto to 24/26 mg bid with low BP. - Continue Lasix 40 mg MWF. - Continue Coreg 3.125 mg bid. - Continue Farxiga 10 mg daily. - Continue digoxin 0.125 mg daily. - Repeat echo. If EF still down, quantify functional limits with CPX, and can re-refer to [facility] for transplant eval/advanced therapies. Discussed with Dr Haroldine Laws. - Labs today.   2. 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  - Underwent MVR with mechanical mitral valve in 12/22 at [facility].  - TEE 7/15 severe biventricular failure. + vegetation on mechanical MVR. - Completed 6 weeks IV abx. - Recent INR 1.7 (goal 2.5-3.5). Coumadin clinic managing.   3. Paroxysmal Atrial Fibrillation - Does not tolerate AF - NSR on ECG today. - Continue amiodarone 200 mg daily. - TSH and LFTs ok on labs from PCP visit on 04/10/22.  4. H/o AKI - Due to ATN/shock - Baseline creatinine 0.98 - 1.0.  - transiently required hemodialysis during last admission. - Recent SCr 1.59 - Continue SGLT2i. - BMET today.  5. Autoimmune Disorder  - He has seronegative spondylitis and pyoderma gangrenosum. He has hidradenitis suppurativa in his buttock. Had been on Humira. - Previously followed by Rheum. - Now following with Derm and [place] on starting secukinumab injections   6. 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    7. H/o left axilla hematoma - LUE MRI 8/23 suggestive  of cellulitis and pyomyositis.? large hematoma.  - Eval at [facility] determined mass to be a hematoma, had decompression in OR and path confirmed hematoma.  8. Deconditioning - He  has had no PT since discharge. Lives with his father. - Consider CR referral next visit.  Follow up in 3 months with Dr. Haroldine Laws. Sooner, depending on results of echo.   [place], FNP 04/22/22

## 2022-04-23 ENCOUNTER — Telehealth (HOSPITAL_COMMUNITY): Payer: Self-pay | Admitting: Cardiology

## 2022-04-23 ENCOUNTER — Encounter (HOSPITAL_COMMUNITY): Payer: Self-pay

## 2022-04-23 ENCOUNTER — Ambulatory Visit: Payer: Medicaid Other | Attending: Internal Medicine | Admitting: *Deleted

## 2022-04-23 DIAGNOSIS — I5022 Chronic systolic (congestive) heart failure: Secondary | ICD-10-CM

## 2022-04-23 DIAGNOSIS — Z952 Presence of prosthetic heart valve: Secondary | ICD-10-CM

## 2022-04-23 DIAGNOSIS — Z5181 Encounter for therapeutic drug level monitoring: Secondary | ICD-10-CM | POA: Diagnosis not present

## 2022-04-23 LAB — POCT INR: INR: 2.5 (ref 2.0–3.0)

## 2022-04-23 MED ORDER — POTASSIUM CHLORIDE CRYS ER 20 MEQ PO TBCR: EXTENDED_RELEASE_TABLET | ORAL | 3 refills | Status: DC

## 2022-04-23 NOTE — Telephone Encounter (Signed)
Patient called.  Patient aware.  

## 2022-04-23 NOTE — Patient Instructions (Signed)
Increase warfarin to 1 tablet daily except 1 1/2 tablet on Sundays and Wednesdays  Continue greens/salads  Recheck in 2 wks On Rifampin 300mg  twice daily

## 2022-04-23 NOTE — Telephone Encounter (Signed)
-----   Message from Jacklynn Ganong, [place] sent at 04/22/2022  4:39 PM EST ----- K is low, hgb low but stable.  Take 40 KCL x 1 today. Add 20 KCL on Lasix days (MWF).  Repeat BMET in 10 days.

## 2022-05-01 ENCOUNTER — Ambulatory Visit (HOSPITAL_COMMUNITY)
Admission: RE | Admit: 2022-05-01 | Discharge: 2022-05-01 | Disposition: A | Discharge status: Home / Self Care | Payer: Medicaid Other | Source: Ambulatory Visit | Attending: Cardiology | Admitting: Cardiology

## 2022-05-01 DIAGNOSIS — I5022 Chronic systolic (congestive) heart failure: Secondary | ICD-10-CM | POA: Diagnosis not present

## 2022-05-01 LAB — BASIC METABOLIC PANEL
Anion gap: 10 (ref 5–15)
BUN: 14 mg/dL (ref 6–20)
CO2: 22 mmol/L (ref 22–32)
Calcium: 9 mg/dL (ref 8.9–10.3)
Chloride: 103 mmol/L (ref 98–111)
Creatinine, Ser: 1.35 mg/dL — ABNORMAL HIGH (ref 0.61–1.24)
GFR, Estimated: >60 mL/min (ref >60)
Glucose, Bld: 108 mg/dL — ABNORMAL HIGH (ref 70–99)
Potassium: 3.4 mmol/L — ABNORMAL LOW (ref 3.5–5.1)
Sodium: 135 mmol/L (ref 135–145)

## 2022-05-02 ENCOUNTER — Telehealth (HOSPITAL_COMMUNITY): Payer: Self-pay

## 2022-05-02 ENCOUNTER — Other Ambulatory Visit (HOSPITAL_COMMUNITY): Payer: Self-pay

## 2022-05-02 DIAGNOSIS — I5022 Chronic systolic (congestive) heart failure: Secondary | ICD-10-CM

## 2022-05-02 NOTE — Telephone Encounter (Signed)
Patient aware and agreeable. Labs ordered and scheduled. 

## 2022-05-07 ENCOUNTER — Ambulatory Visit: Payer: Medicaid Other | Attending: Internal Medicine | Admitting: Internal Medicine

## 2022-05-07 ENCOUNTER — Telehealth (INDEPENDENT_AMBULATORY_CARE_PROVIDER_SITE_OTHER): Payer: Self-pay | Admitting: Internal Medicine

## 2022-05-07 DIAGNOSIS — Z5181 Encounter for therapeutic drug level monitoring: Secondary | ICD-10-CM

## 2022-05-07 DIAGNOSIS — Z952 Presence of prosthetic heart valve: Secondary | ICD-10-CM | POA: Diagnosis not present

## 2022-05-07 LAB — POCT INR: INR: 1.1 — AB (ref 2.0–3.0)

## 2022-05-07 NOTE — Patient Instructions (Signed)
Take warfarin 2 tablets tonight and tomorrow night then increase dose to 1 1/2 tablets daily  Continue greens/salads  Recheck in 1 wk On Rifampin 300mg  twice daily

## 2022-05-07 NOTE — Telephone Encounter (Signed)
Erroneous encounter, please disregard

## 2022-05-15 ENCOUNTER — Ambulatory Visit: Payer: Medicaid Other | Attending: Internal Medicine | Admitting: *Deleted

## 2022-05-15 DIAGNOSIS — Z952 Presence of prosthetic heart valve: Secondary | ICD-10-CM | POA: Diagnosis not present

## 2022-05-15 DIAGNOSIS — Z5181 Encounter for therapeutic drug level monitoring: Secondary | ICD-10-CM

## 2022-05-15 LAB — POCT INR: INR: 1.2 — AB (ref 2.0–3.0)

## 2022-05-15 NOTE — Patient Instructions (Signed)
Looked into why INR levels are so low other than the addition of Rifampin.  Pt went home and called me back.  Found out pt has been using a 2mg  warfarin tablet.  Confirmed with [facility] and they stated he has 2mg  and 4mg  tablet on file.  Before his admission to [facility] he had a 7.5mg  tablet. Take warfarin 5 tablets (2mg  tablet) every night until INR check on Monday. Pt verbalized understanding. On Rifampin 300mg  twice daily

## 2022-05-19 ENCOUNTER — Ambulatory Visit: Payer: Medicaid Other | Attending: Internal Medicine | Admitting: *Deleted

## 2022-05-19 DIAGNOSIS — Z952 Presence of prosthetic heart valve: Secondary | ICD-10-CM

## 2022-05-19 DIAGNOSIS — Z5181 Encounter for therapeutic drug level monitoring: Secondary | ICD-10-CM | POA: Diagnosis not present

## 2022-05-19 LAB — POCT INR: INR: 1.5 — AB (ref 2.0–3.0)

## 2022-05-19 MED ORDER — WARFARIN SODIUM 5 MG PO TABS: ORAL_TABLET | ORAL | 5 refills | Status: DC

## 2022-05-19 NOTE — Patient Instructions (Signed)
Sent in [facility] for warfarin 5mg  tablet. Take 2 tablets daily except Mondays and Thursdays take 3 tablets Recheck in 1 wk. On Rifampin 300mg  twice daily

## 2022-05-22 ENCOUNTER — Telehealth (HOSPITAL_COMMUNITY): Payer: Self-pay | Admitting: *Deleted

## 2022-05-22 ENCOUNTER — Other Ambulatory Visit (HOSPITAL_COMMUNITY): Payer: Medicaid Other

## 2022-05-26 ENCOUNTER — Ambulatory Visit: Payer: Medicaid Other | Attending: Internal Medicine | Admitting: *Deleted

## 2022-05-26 DIAGNOSIS — Z952 Presence of prosthetic heart valve: Secondary | ICD-10-CM

## 2022-05-26 DIAGNOSIS — Z5181 Encounter for therapeutic drug level monitoring: Secondary | ICD-10-CM

## 2022-05-26 LAB — POCT INR: INR: 1.9 — AB (ref 2.0–3.0)

## 2022-05-26 NOTE — Patient Instructions (Signed)
Sent in [facility] for warfarin 5mg  tablet. Increase warfarin to 2 tablets daily except take 3 tablets on Mondays, Wednesdays and Fridays Recheck in 1 wk. On Rifampin 300mg  twice daily

## 2022-05-28 ENCOUNTER — Ambulatory Visit (HOSPITAL_COMMUNITY)
Admission: RE | Admit: 2022-05-28 | Discharge: 2022-05-28 | Disposition: A | Discharge status: Home / Self Care | Payer: Medicaid Other | Source: Ambulatory Visit | Attending: Cardiology | Admitting: Cardiology

## 2022-05-28 DIAGNOSIS — I5022 Chronic systolic (congestive) heart failure: Secondary | ICD-10-CM

## 2022-05-28 LAB — BASIC METABOLIC PANEL
Anion gap: 10 (ref 5–15)
BUN: 18 mg/dL (ref 6–20)
CO2: 24 mmol/L (ref 22–32)
Calcium: 9.1 mg/dL (ref 8.9–10.3)
Chloride: 100 mmol/L (ref 98–111)
Creatinine, Ser: 1.28 mg/dL — ABNORMAL HIGH (ref 0.61–1.24)
GFR, Estimated: >60 mL/min (ref >60)
Glucose, Bld: 82 mg/dL (ref 70–99)
Potassium: 3.6 mmol/L (ref 3.5–5.1)
Sodium: 134 mmol/L — ABNORMAL LOW (ref 135–145)

## 2022-05-28 LAB — MAGNESIUM: Magnesium: 1.9 mg/dL (ref 1.7–2.4)

## 2022-06-02 ENCOUNTER — Ambulatory Visit: Payer: Medicaid Other | Attending: Internal Medicine | Admitting: *Deleted

## 2022-06-02 DIAGNOSIS — Z952 Presence of prosthetic heart valve: Secondary | ICD-10-CM | POA: Diagnosis not present

## 2022-06-02 DIAGNOSIS — Z5181 Encounter for therapeutic drug level monitoring: Secondary | ICD-10-CM | POA: Diagnosis not present

## 2022-06-02 LAB — POCT INR: INR: 2 (ref 2.0–3.0)

## 2022-06-02 NOTE — Patient Instructions (Signed)
Sent in [facility] for warfarin 5mg  tablet. Increase warfarin to 3 tablets daily except take 2 tablets on Sundays and Thursdays Recheck in 1 wk. On Rifampin 300mg  twice daily

## 2022-06-05 ENCOUNTER — Ambulatory Visit (HOSPITAL_COMMUNITY)
Admission: RE | Admit: 2022-06-05 | Discharge: 2022-06-05 | Disposition: A | Discharge status: Home / Self Care | Payer: Medicaid Other | Source: Ambulatory Visit | Attending: Family Medicine | Admitting: Family Medicine

## 2022-06-05 DIAGNOSIS — Z954 Presence of other heart-valve replacement: Secondary | ICD-10-CM | POA: Diagnosis not present

## 2022-06-05 DIAGNOSIS — I4892 Unspecified atrial flutter: Secondary | ICD-10-CM | POA: Diagnosis not present

## 2022-06-05 DIAGNOSIS — I517 Cardiomegaly: Secondary | ICD-10-CM | POA: Diagnosis not present

## 2022-06-05 DIAGNOSIS — I351 Nonrheumatic aortic (valve) insufficiency: Secondary | ICD-10-CM | POA: Diagnosis not present

## 2022-06-05 DIAGNOSIS — I083 Combined rheumatic disorders of mitral, aortic and tricuspid valves: Secondary | ICD-10-CM | POA: Insufficient documentation

## 2022-06-05 DIAGNOSIS — Z87891 Personal history of nicotine dependence: Secondary | ICD-10-CM | POA: Diagnosis not present

## 2022-06-05 DIAGNOSIS — I252 Old myocardial infarction: Secondary | ICD-10-CM | POA: Insufficient documentation

## 2022-06-05 DIAGNOSIS — I5022 Chronic systolic (congestive) heart failure: Secondary | ICD-10-CM | POA: Diagnosis not present

## 2022-06-05 DIAGNOSIS — Z8673 Personal history of transient ischemic attack (TIA), and cerebral infarction without residual deficits: Secondary | ICD-10-CM | POA: Insufficient documentation

## 2022-06-05 LAB — ECHOCARDIOGRAM COMPLETE
Area-P 1/2: 2.53 cm2
MV VTI: 1.43 cm2
S' Lateral: 4.7 cm

## 2022-06-05 NOTE — Progress Notes (Signed)
  Echocardiogram 2D Echocardiogram has been performed.  Milda Smart 06/05/2022, 12:07 PM

## 2022-06-06 ENCOUNTER — Telehealth (HOSPITAL_COMMUNITY): Payer: Self-pay | Admitting: Cardiology

## 2022-06-06 DIAGNOSIS — I5022 Chronic systolic (congestive) heart failure: Secondary | ICD-10-CM

## 2022-06-06 NOTE — Telephone Encounter (Signed)
-----   Message from Jacklynn Ganong, [place] sent at 06/06/2022  2:25 PM EST ----- Echo showed improvement, now 25-30% (previously 15%). RV severely reduced.  Would like to arrange CPX to get an idea of his functional capacity before his next appt with Dr. Gala Romney.

## 2022-06-06 NOTE — Telephone Encounter (Signed)
Patient called.  Patient aware. Order placed and added to recall

## 2022-06-10 DIAGNOSIS — I252 Old myocardial infarction: Secondary | ICD-10-CM | POA: Diagnosis not present

## 2022-06-10 DIAGNOSIS — Z79899 Other long term (current) drug therapy: Secondary | ICD-10-CM | POA: Diagnosis not present

## 2022-06-10 DIAGNOSIS — J340 Abscess, furuncle and carbuncle of nose: Secondary | ICD-10-CM | POA: Diagnosis not present

## 2022-06-10 DIAGNOSIS — Z87891 Personal history of nicotine dependence: Secondary | ICD-10-CM | POA: Diagnosis not present

## 2022-06-10 DIAGNOSIS — L0291 Cutaneous abscess, unspecified: Secondary | ICD-10-CM | POA: Diagnosis not present

## 2022-06-10 DIAGNOSIS — I5032 Chronic diastolic (congestive) heart failure: Secondary | ICD-10-CM | POA: Diagnosis not present

## 2022-06-10 DIAGNOSIS — I110 Hypertensive heart disease with heart failure: Secondary | ICD-10-CM | POA: Diagnosis not present

## 2022-06-12 ENCOUNTER — Other Ambulatory Visit (HOSPITAL_COMMUNITY): Payer: Self-pay | Admitting: Cardiology

## 2022-06-12 ENCOUNTER — Ambulatory Visit: Payer: Medicaid Other | Attending: Internal Medicine | Admitting: *Deleted

## 2022-06-12 DIAGNOSIS — Z5181 Encounter for therapeutic drug level monitoring: Secondary | ICD-10-CM | POA: Diagnosis not present

## 2022-06-12 DIAGNOSIS — Z952 Presence of prosthetic heart valve: Secondary | ICD-10-CM

## 2022-06-12 LAB — POCT INR: INR: 2 (ref 2.0–3.0)

## 2022-06-12 MED ORDER — FUROSEMIDE 40 MG PO TABS: 40.0000 mg | ORAL_TABLET | ORAL | 1 refills | Status: DC

## 2022-06-12 MED ORDER — AMIODARONE HCL 200 MG PO TABS: 200.0000 mg | ORAL_TABLET | Freq: Every day | ORAL | 1 refills | Status: DC

## 2022-06-12 NOTE — Patient Instructions (Signed)
Sent in [facility] for warfarin 5mg  tablet. Increase warfarin to 3 tablets daily  Recheck in 10 days. On Rifampin 300mg  twice daily

## 2022-06-23 ENCOUNTER — Ambulatory Visit: Payer: Medicaid Other | Attending: Internal Medicine | Admitting: *Deleted

## 2022-06-23 DIAGNOSIS — Z952 Presence of prosthetic heart valve: Secondary | ICD-10-CM

## 2022-06-23 DIAGNOSIS — Z5181 Encounter for therapeutic drug level monitoring: Secondary | ICD-10-CM | POA: Diagnosis not present

## 2022-06-23 LAB — POCT INR: INR: 5.1 — AB (ref 2.0–3.0)

## 2022-06-23 NOTE — Patient Instructions (Signed)
Sent in [facility] for warfarin 5mg  tablet. Hold warfarin tonight and tomorrow night then decrease dose to 2 tablets daily except 3 tablets on Tuesdays, Thursdays and Saturdays  Recheck in 1 week. Finished Rifampin 300mg  twice daily 1 wk ago

## 2022-06-30 ENCOUNTER — Ambulatory Visit: Payer: Medicaid Other | Attending: Internal Medicine | Admitting: *Deleted

## 2022-06-30 DIAGNOSIS — Z952 Presence of prosthetic heart valve: Secondary | ICD-10-CM | POA: Diagnosis not present

## 2022-06-30 DIAGNOSIS — Z5181 Encounter for therapeutic drug level monitoring: Secondary | ICD-10-CM

## 2022-06-30 LAB — POCT INR: INR: 4.9 — AB (ref 2.0–3.0)

## 2022-06-30 NOTE — Patient Instructions (Signed)
Sent in [facility] for warfarin 5mg  tablet. Hold warfarin tonight then decrease dose to 2 tablets daily  Recheck in 1 week. Finished Rifampin 300mg  twice daily 1 wk ago

## 2022-07-09 ENCOUNTER — Ambulatory Visit: Payer: Medicaid Other | Attending: Internal Medicine | Admitting: *Deleted

## 2022-07-09 DIAGNOSIS — Z5181 Encounter for therapeutic drug level monitoring: Secondary | ICD-10-CM

## 2022-07-09 DIAGNOSIS — Z952 Presence of prosthetic heart valve: Secondary | ICD-10-CM

## 2022-07-09 LAB — POCT INR: INR: 5.3 — AB (ref 2.0–3.0)

## 2022-07-09 NOTE — Patient Instructions (Signed)
Sent in [place] for warfarin 5mg  tablet. Hold warfarin tonight then decrease dose to 2 tablets daily except 1 tablet on Tuesdays, Thursdays and Saturdays Recheck in 1 week. Finished Rifampin 300mg  twice daily 2-3 wk ago

## 2022-07-15 DIAGNOSIS — Z789 Other specified health status: Secondary | ICD-10-CM | POA: Diagnosis not present

## 2022-07-15 DIAGNOSIS — M2560 Stiffness of unspecified joint, not elsewhere classified: Secondary | ICD-10-CM | POA: Diagnosis not present

## 2022-07-15 DIAGNOSIS — R29898 Other symptoms and signs involving the musculoskeletal system: Secondary | ICD-10-CM | POA: Diagnosis not present

## 2022-07-15 DIAGNOSIS — Z7409 Other reduced mobility: Secondary | ICD-10-CM | POA: Diagnosis not present

## 2022-07-15 DIAGNOSIS — Z8673 Personal history of transient ischemic attack (TIA), and cerebral infarction without residual deficits: Secondary | ICD-10-CM | POA: Diagnosis not present

## 2022-07-16 ENCOUNTER — Other Ambulatory Visit (HOSPITAL_COMMUNITY): Payer: Self-pay

## 2022-07-16 ENCOUNTER — Ambulatory Visit: Payer: Medicaid Other | Attending: Internal Medicine | Admitting: *Deleted

## 2022-07-16 DIAGNOSIS — Z5181 Encounter for therapeutic drug level monitoring: Secondary | ICD-10-CM

## 2022-07-16 DIAGNOSIS — Z952 Presence of prosthetic heart valve: Secondary | ICD-10-CM | POA: Diagnosis not present

## 2022-07-16 LAB — POCT INR: INR: 2.6 (ref 2.0–3.0)

## 2022-07-16 MED ORDER — FUROSEMIDE 40 MG PO TABS: 40.0000 mg | ORAL_TABLET | ORAL | 1 refills | Status: DC

## 2022-07-16 MED ORDER — PANTOPRAZOLE SODIUM 40 MG PO TBEC: 40.0000 mg | DELAYED_RELEASE_TABLET | Freq: Every day | ORAL | 0 refills | Status: DC

## 2022-07-16 NOTE — Patient Instructions (Signed)
Sent in [place] for warfarin 5mg  tablet. Continue warfarin 2 tablets daily except 1 tablet on Tuesdays, Thursdays and Saturdays Recheck in 1 week. Finished Rifampin 300mg  twice daily 2-3 wk ago

## 2022-07-17 DIAGNOSIS — Z8673 Personal history of transient ischemic attack (TIA), and cerebral infarction without residual deficits: Secondary | ICD-10-CM | POA: Diagnosis not present

## 2022-07-17 DIAGNOSIS — Z789 Other specified health status: Secondary | ICD-10-CM | POA: Diagnosis not present

## 2022-07-17 DIAGNOSIS — R29898 Other symptoms and signs involving the musculoskeletal system: Secondary | ICD-10-CM | POA: Diagnosis not present

## 2022-07-17 DIAGNOSIS — M2560 Stiffness of unspecified joint, not elsewhere classified: Secondary | ICD-10-CM | POA: Diagnosis not present

## 2022-07-17 DIAGNOSIS — Z7409 Other reduced mobility: Secondary | ICD-10-CM | POA: Diagnosis not present

## 2022-07-21 DIAGNOSIS — L732 Hidradenitis suppurativa: Secondary | ICD-10-CM | POA: Diagnosis not present

## 2022-07-23 ENCOUNTER — Ambulatory Visit: Payer: Medicaid Other | Attending: Internal Medicine | Admitting: *Deleted

## 2022-07-23 DIAGNOSIS — Z5181 Encounter for therapeutic drug level monitoring: Secondary | ICD-10-CM

## 2022-07-23 DIAGNOSIS — Z952 Presence of prosthetic heart valve: Secondary | ICD-10-CM

## 2022-07-23 LAB — POCT INR: INR: 3.3 — AB (ref 2.0–3.0)

## 2022-07-23 NOTE — Patient Instructions (Signed)
Sent in [place] for warfarin 5mg  tablet. Decrease warfarin to 1 tablet daily except 2 tablets on Mondays, Wednesdays and Fridays due to minocycline. Recheck in 1 week. Starting minocycline 100mg  twice daily 07/23/22.  Can increase INR

## 2022-07-30 ENCOUNTER — Ambulatory Visit: Payer: Medicaid Other | Attending: Internal Medicine | Admitting: *Deleted

## 2022-07-30 DIAGNOSIS — M2560 Stiffness of unspecified joint, not elsewhere classified: Secondary | ICD-10-CM | POA: Diagnosis not present

## 2022-07-30 DIAGNOSIS — Z5181 Encounter for therapeutic drug level monitoring: Secondary | ICD-10-CM

## 2022-07-30 DIAGNOSIS — Z789 Other specified health status: Secondary | ICD-10-CM | POA: Diagnosis not present

## 2022-07-30 DIAGNOSIS — Z952 Presence of prosthetic heart valve: Secondary | ICD-10-CM | POA: Diagnosis not present

## 2022-07-30 DIAGNOSIS — R29898 Other symptoms and signs involving the musculoskeletal system: Secondary | ICD-10-CM | POA: Diagnosis not present

## 2022-07-30 DIAGNOSIS — Z8673 Personal history of transient ischemic attack (TIA), and cerebral infarction without residual deficits: Secondary | ICD-10-CM | POA: Diagnosis not present

## 2022-07-30 DIAGNOSIS — Z7409 Other reduced mobility: Secondary | ICD-10-CM | POA: Diagnosis not present

## 2022-07-30 LAB — POCT INR: INR: 4.3 — AB (ref 2.0–3.0)

## 2022-07-30 NOTE — Patient Instructions (Signed)
Sent in [place] for warfarin 39m tablet. Hold warfarin tonight then decrease dose to 1 tablet daily except 2 tablets on Monday and Fridays due to minocycline. Recheck in 10 days Starting minocycline 1017mtwice daily 07/23/22.  Can increase INR

## 2022-08-11 ENCOUNTER — Ambulatory Visit: Payer: Medicaid Other | Attending: Internal Medicine | Admitting: Pharmacist

## 2022-08-11 DIAGNOSIS — Z952 Presence of prosthetic heart valve: Secondary | ICD-10-CM

## 2022-08-11 DIAGNOSIS — Z5181 Encounter for therapeutic drug level monitoring: Secondary | ICD-10-CM | POA: Diagnosis not present

## 2022-08-11 LAB — POCT INR: POC INR: 4.2

## 2022-08-11 NOTE — Patient Instructions (Signed)
Hold warfarin tonight then decrease dose to 1 tablet daily except 2 Fridays due to minocycline. Recheck in 10 days Starting minocycline '100mg'$  twice daily 07/23/22.  Can increase INR

## 2022-08-20 ENCOUNTER — Ambulatory Visit: Payer: Medicaid Other | Attending: Internal Medicine | Admitting: *Deleted

## 2022-08-20 DIAGNOSIS — Z5181 Encounter for therapeutic drug level monitoring: Secondary | ICD-10-CM | POA: Diagnosis not present

## 2022-08-20 DIAGNOSIS — Z952 Presence of prosthetic heart valve: Secondary | ICD-10-CM | POA: Diagnosis not present

## 2022-08-20 LAB — POCT INR: INR: 4.1 — AB (ref 2.0–3.0)

## 2022-08-20 NOTE — Patient Instructions (Signed)
Hold warfarin tonight then decrease dose to 1 tablet daily due to minocycline. Recheck in 10 days Starting minocycline '100mg'$  twice daily 07/23/22.  Can increase INR

## 2022-08-25 ENCOUNTER — Other Ambulatory Visit (HOSPITAL_COMMUNITY): Payer: Self-pay | Admitting: Internal Medicine

## 2022-09-03 ENCOUNTER — Ambulatory Visit: Payer: Medicaid Other | Attending: Internal Medicine

## 2022-09-03 DIAGNOSIS — Z5181 Encounter for therapeutic drug level monitoring: Secondary | ICD-10-CM

## 2022-09-03 DIAGNOSIS — Z952 Presence of prosthetic heart valve: Secondary | ICD-10-CM

## 2022-09-03 LAB — POCT INR: INR: 2.6 (ref 2.0–3.0)

## 2022-09-03 NOTE — Patient Instructions (Signed)
Description   Continue on same dosage 1 tablet daily due to minocycline. Recheck in 3 weeks. Starting minocycline 100mg  twice daily 07/23/22.  Can increase INR

## 2022-09-11 ENCOUNTER — Encounter (HOSPITAL_COMMUNITY): Payer: Self-pay | Admitting: Internal Medicine

## 2022-09-11 ENCOUNTER — Ambulatory Visit (HOSPITAL_COMMUNITY)
Admission: RE | Admit: 2022-09-11 | Discharge: 2022-09-11 | Disposition: A | Discharge status: Home / Self Care | Payer: Medicaid Other | Source: Ambulatory Visit | Attending: Internal Medicine | Admitting: Internal Medicine

## 2022-09-11 VITALS — BP 108/82 | HR 78 | Wt 211.8 lb

## 2022-09-11 DIAGNOSIS — D8989 Other specified disorders involving the immune mechanism, not elsewhere classified: Secondary | ICD-10-CM | POA: Insufficient documentation

## 2022-09-11 DIAGNOSIS — I272 Pulmonary hypertension, unspecified: Secondary | ICD-10-CM | POA: Diagnosis not present

## 2022-09-11 DIAGNOSIS — N529 Male erectile dysfunction, unspecified: Secondary | ICD-10-CM | POA: Insufficient documentation

## 2022-09-11 DIAGNOSIS — F4321 Adjustment disorder with depressed mood: Secondary | ICD-10-CM | POA: Diagnosis not present

## 2022-09-11 DIAGNOSIS — Z7982 Long term (current) use of aspirin: Secondary | ICD-10-CM | POA: Insufficient documentation

## 2022-09-11 DIAGNOSIS — F32A Depression, unspecified: Secondary | ICD-10-CM | POA: Insufficient documentation

## 2022-09-11 DIAGNOSIS — I5022 Chronic systolic (congestive) heart failure: Secondary | ICD-10-CM | POA: Insufficient documentation

## 2022-09-11 DIAGNOSIS — Z7901 Long term (current) use of anticoagulants: Secondary | ICD-10-CM | POA: Diagnosis not present

## 2022-09-11 DIAGNOSIS — L88 Pyoderma gangrenosum: Secondary | ICD-10-CM | POA: Diagnosis not present

## 2022-09-11 DIAGNOSIS — Z952 Presence of prosthetic heart valve: Secondary | ICD-10-CM | POA: Insufficient documentation

## 2022-09-11 DIAGNOSIS — I428 Other cardiomyopathies: Secondary | ICD-10-CM | POA: Diagnosis not present

## 2022-09-11 DIAGNOSIS — M4680 Other specified inflammatory spondylopathies, site unspecified: Secondary | ICD-10-CM | POA: Diagnosis not present

## 2022-09-11 DIAGNOSIS — Z7984 Long term (current) use of oral hypoglycemic drugs: Secondary | ICD-10-CM | POA: Diagnosis not present

## 2022-09-11 DIAGNOSIS — I5082 Biventricular heart failure: Secondary | ICD-10-CM | POA: Diagnosis not present

## 2022-09-11 DIAGNOSIS — I480 Paroxysmal atrial fibrillation: Secondary | ICD-10-CM | POA: Diagnosis not present

## 2022-09-11 DIAGNOSIS — Z79899 Other long term (current) drug therapy: Secondary | ICD-10-CM | POA: Insufficient documentation

## 2022-09-11 DIAGNOSIS — I110 Hypertensive heart disease with heart failure: Secondary | ICD-10-CM | POA: Diagnosis not present

## 2022-09-11 LAB — COMPREHENSIVE METABOLIC PANEL
ALT: 16 U/L (ref 0–44)
AST: 41 U/L (ref 15–41)
Albumin: 2.8 g/dL — ABNORMAL LOW (ref 3.5–5.0)
Alkaline Phosphatase: 103 U/L (ref 38–126)
Anion gap: 11 (ref 5–15)
BUN: 17 mg/dL (ref 6–20)
CO2: 22 mmol/L (ref 22–32)
Calcium: 9.3 mg/dL (ref 8.9–10.3)
Chloride: 100 mmol/L (ref 98–111)
Creatinine, Ser: 1.34 mg/dL — ABNORMAL HIGH (ref 0.61–1.24)
GFR, Estimated: >60 mL/min (ref >60)
Glucose, Bld: 80 mg/dL (ref 70–99)
Potassium: 3.5 mmol/L (ref 3.5–5.1)
Sodium: 133 mmol/L — ABNORMAL LOW (ref 135–145)
Total Bilirubin: 0.5 mg/dL (ref 0.3–1.2)
Total Protein: 10 g/dL — ABNORMAL HIGH (ref 6.5–8.1)

## 2022-09-11 LAB — CBC
HCT: 37.1 % — ABNORMAL LOW (ref 39.0–52.0)
Hemoglobin: 11.6 g/dL — ABNORMAL LOW (ref 13.0–17.0)
MCH: 24.4 pg — ABNORMAL LOW (ref 26.0–34.0)
MCHC: 31.3 g/dL (ref 30.0–36.0)
MCV: 77.9 fL — ABNORMAL LOW (ref 80.0–100.0)
Platelets: 429 10*3/uL — ABNORMAL HIGH (ref 150–400)
RBC: 4.76 MIL/uL (ref 4.22–5.81)
RDW: 16.9 % — ABNORMAL HIGH (ref 11.5–15.5)
WBC: 6.7 10*3/uL (ref 4.0–10.5)
nRBC: 0 % (ref 0.0–0.2)

## 2022-09-11 LAB — BRAIN NATRIURETIC PEPTIDE: B Natriuretic Peptide: 247.3 pg/mL — ABNORMAL HIGH (ref 0.0–100.0)

## 2022-09-11 LAB — TSH: TSH: 3.252 u[IU]/mL (ref 0.350–4.500)

## 2022-09-11 LAB — PROTIME-INR
INR: 1.7 — ABNORMAL HIGH (ref 0.8–1.2)
Prothrombin Time: 19.4 seconds — ABNORMAL HIGH (ref 11.4–15.2)

## 2022-09-11 MED ORDER — DIGOXIN 125 MCG PO TABS: 0.1250 mg | ORAL_TABLET | Freq: Every day | ORAL | 3 refills | Status: DC

## 2022-09-11 MED ORDER — SPIRONOLACTONE 25 MG PO TABS: 12.5000 mg | ORAL_TABLET | Freq: Every day | ORAL | 1 refills | Status: DC

## 2022-09-11 MED ORDER — SILDENAFIL CITRATE 50 MG PO TABS: 50.0000 mg | ORAL_TABLET | Freq: Every day | ORAL | 1 refills | Status: DC | PRN

## 2022-09-11 MED ORDER — SERTRALINE HCL 50 MG PO TABS: 50.0000 mg | ORAL_TABLET | Freq: Every day | ORAL | 4 refills | Status: DC

## 2022-09-11 NOTE — Patient Instructions (Addendum)
Good to see you today!   START Spironolactone 12.5 mg daily(1/2 tablet)  Digoxin 0.125 mg tablet daily  Zoloft 25 mg ( 1/2 Tablet) for 1 week then increase to 50 mg  ( 1 tablet) daily  Viagra 50 mg as needed  Labs done today, your results will be available in MyChart, we will contact you for abnormal readings.   If you have any questions or concerns before your next appointment please send [place] a message through [place] or call our office at [phone].    TO LEAVE A MESSAGE FOR THE NURSE SELECT OPTION 2, PLEASE LEAVE A MESSAGE INCLUDING: YOUR NAME DATE OF BIRTH CALL BACK NUMBER REASON FOR CALL**this is important as we prioritize the call backs  YOU WILL RECEIVE A CALL BACK THE SAME DAY AS LONG AS YOU CALL BEFORE 4:00 PM  At the [facility], you and your health needs are our priority. As part of our continuing mission to provide you with exceptional heart care, we have created designated Provider Care Teams. These Care Teams include your primary Cardiologist (physician) and Advanced Practice Providers (APPs- Physician Assistants and Nurse Practitioners) who all work together to provide you with the care you need, when you need it.   You may see any of the following providers on your designated Care Team at your next follow up: Dr Glori Bickers Dr Loralie Champagne Dr. Roxana Hires, NP Lyda Jester, [place] [facility] [place], [place] Forestine Na, NP Audry Riles, PharmD   Please be sure to bring in all your medications bottles to every appointment.    Thank you for choosing [facility]

## 2022-09-11 NOTE — Progress Notes (Signed)
Advanced Heart Failure Clinic Note   PCP: Patient, No Pcp Per Primary Cardiologist: Carlyle Dolly, MD  [facility]: Dr. Haroldine Laws   HPI:  Donald [name] is a 37 y.o. male with history of mitral regurgitation (s/p MV repair with resection of ruptured anterior papillary muscle and reconstruction of papillary chord and placement of annuloplasty ring in 2019). Had recurrent MR and underwent MVR w/ mechanical valve on 06/10/21 by Dr. Wannetta Sender at [facility].   Admitted 12/22 for a/c CHF and evidence of low output and AKI. Echo with EF 20-25% with severe MR/mod MS and severe RV dysfunction. PICC place, started on empiric milrinone. AKI resolved with addition of milrinone. He underwent MVR w/ mechanical valve on 12/26 by Dr. Wannetta Sender at [facility].  Admitted 2/23 with low output HF in setting of AF. Echo showed LVEF <20%, RV severely reduced. Mechanical MV ok, Gradient 3 mmHg. Started on milrinone and amio gtt. Diuresed w/ IV Lasix. Diuresed and extubated. Converted to NSR, amio and milrinone weaned off. GDMT titrated. Discharged home, weight was 198 lb.   Echo 10/11/21: EF 25%. Mild RV dysfunction.  Admitted 7/23 with cardiogenic shock. Echo EF 15%, RV sev HK, MVR thick, mean gradient 5-6. Intubated. TEE with severe biventricular failure with vegetation on mechanical MVR. Had CODE stroke. MRI w/ several small acute right MCA territory ischemic strokes, suggestive of a cardioembolic etiology.  Had upper extremity swelling. 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  Ortho consulted and rec transfer to [facility] for Ortho Onc. While at [facility], UE mass found to be hematoma. Kidney function declined and he was transiently on hemodialysis.   Seen in ED 04/05/22 with palpitations. Found to be in SVT vs AFL, and hypotensive. Underwent emergent DCCV to NSR, BP improved.  Echo 12/23 EF 25-30% RV sev HK MVR ok mean gradient 6 (? Mild PPM)  Today he returns for HF follow up. Feels good. Denies SOB, edema or palpitations.  Spends most of his time chasing after his 56yo son.  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 He has not had follow up at [facility]. Compliant with meds. No bleeding with warfarin. Last INR 2.6. Says he hasn't been on Entresto or digoxin for a long time  Cardiac Studies:  - TEE (7/23): LVEF < 20%, RV severely reduced, mean mitral valve gradient 8 mmHg with trivial MR + vegetation on MV  - Echo (7/23): EF < 15%, RV severely down  - Echo (4/23): EF 25%, mild RV dysfunction  - Echo (2/23): EF < 20%, severe LV dysfunction with global HK, grade II DD, RV severely reduced, S/p mitral valve repair. MV mean gradient 3 mmHg   - Echo (12/22): EF 20-25%, severe LV dysfunction with global HK, mild LVH, RV  moderately reduced, elevated MV gradient 65mmHg, mild AI  - R/LHC (09/11/20):   RA = 7 RV = 49/10 PA = 48/16 (32) PCW = 17 (v=32) Fick cardiac output/index = 5.3/2.5 PVR = 2.9 WU Ao sat = 99% PA sat = 71%, 70% High SVC sat =  75%  1. Normal coronary arteries 2. NICM EF 30-35% 3. Severe MR with prominent v-waves in PCWP tracing 4. 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 EF 35-40% by echo in 07/2018 b. EF at 45% by repeat echo in 04/2020  DVT (deep venous thrombosis) (HCC)    h/o   Dysrhythmia    Mitral regurgitation    a. 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 b. 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HCC)    arthritis   Shock, septic and cardiogenic 12/28/2021   Tricuspid regurgitation     Current Outpatient Medications  Medication Sig Dispense Refill   acetaminophen (TYLENOL)  325 MG tablet Take 2 tablets (650 mg total) by mouth every 4 (four) hours as needed for headache or mild pain.     amiodarone (PACERONE) 200 MG tablet Take 1 tablet by mouth once daily 30 tablet 0   aspirin 81 MG chewable tablet Chew 1 tablet (81 mg total) by mouth daily. 30 tablet 1   dapagliflozin propanediol (FARXIGA) 10 MG TABS tablet Take 1 tablet (10 mg total) by mouth daily. 30 tablet 1   feeding supplement (ENSURE ENLIVE / ENSURE PLUS) LIQD Take 237 mLs by mouth 3 (three) times daily between meals. (Patient taking differently: Take 237 mLs by mouth 3 (three) times daily between meals. As needed) 237 mL 12   furosemide (LASIX) 40 MG tablet Take 1 tablet (40 mg total) by mouth 3 (three) times a week. Patient takes 1 tablet by mouth on Monday Wednesday and Fridays. 30 tablet 1   minocycline (MINOCIN) 100 MG capsule Take 100 mg by mouth 2 (two) times daily.     pantoprazole (PROTONIX) 40 MG tablet Take 1 tablet by mouth once daily 30 tablet 0   Potassium Chloride (KLOR-CON PO) 20 mEq. Patient takes on Monday Wednesday and Fridays.     Secukinumab, 300 MG Dose, (COSENTYX, 300 MG DOSE,) 150 MG/ML SOSY Inject into the skin.  every 4 weeks     warfarin (COUMADIN) 5 MG tablet Take warfarin 2 - 3 tablets daily or as directed by coumadin clinic 90 tablet 5   No current facility-administered medications for this encounter.    No Known Allergies    Social History   Socioeconomic History   Marital status: Single    Spouse name: Not on file   Number of children: Not on file   Years of education: Not on file   Highest education level: Not on file  Occupational History   Not on file  Tobacco Use   Smoking status: Former    Types: Cigarettes    Quit date: 10/14/2020    Years since quitting: 1.9   Smokeless tobacco: Never  Vaping Use   Vaping Use: Never used  Substance and Sexual Activity   Alcohol use: No   Drug use: No   Sexual activity: Yes  Other Topics Concern   Not on file  Social  History Narrative   Not on file   Social Determinants of Health   Financial Resource Strain: Not on file  Food Insecurity: Not on file  Transportation Needs: Not on file  Physical Activity: Not on file  Stress: Not on file  Social Connections: Not on file  Intimate Partner Violence: Not on file   Family History  Problem Relation Age of Onset   Multiple sclerosis Mother    Psoriasis Mother    Depression Father    Diabetes Father    Diabetes Paternal Grandmother    BP 108/82  Pulse 78   Wt 96.1 kg (211 lb 12.8 oz)   SpO2 99%   BMI 30.39 kg/m   Wt Readings from Last 3 Encounters:  09/11/22 96.1 kg (211 lb 12.8 oz)  04/22/22 82.6 kg (182 lb)  04/04/22 89.7 kg (197 lb 12 oz)   PHYSICAL EXAM: General:  Well appearing. No resp difficulty HEENT: normal Neck: supple. no JVD. Carotids 2+ bilat; no bruits. No lymphadenopathy or thryomegaly appreciated. Cor: PMI nondisplaced. Regular rate & rhythm mechanical s1 Lungs: clear Abdomen: soft, nontender, nondistended. No hepatosplenomegaly. No bruits or masses. Good bowel sounds. Extremities: no cyanosis, clubbing, rash, edema Neuro: alert & orientedx3, cranial nerves grossly intact. LUE flaccidAffect pleasant   ECG: NSR 90 bpm (personally reviewed).  ASSESSMENT & PLAN:  1. Chronic HFrEF/Biventricular Failure - He has known biventricular failure - Echo (4/23): EF of 20-25% and RV function was severely reduced.  - Echo (7/23): EF 10% RV severely HK. Cardiogenic/septic shock, required NE and milrinone. - TEE (7/23) showed severe BiV failure + vegetation on mechanical MVR. - Today, improved NYHA II, though suspect he is not very active. Volume look ok. - Has been off Entresto, carvedilol and digoxin for unknown reason - Continue Lasix 40 mg MWF. - Continue Coreg 3.125 mg bid. - Continue Farxiga 10 mg daily. - start spiro 12.5 and digoxin 0.125mg  daily  - Labs today  - Return to NP/PA clinic in 4 weeks to try to get him on  losartan - Follow closely. If getting worse will need referral back to [facility] for ongoing transplant eval   2. 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  - Underwent MVR with mechanical mitral valve in 12/22 at [facility].  - TEE 7/23 severe biventricular failure. + vegetation on mechanical MVR. - Completed 6 weeks IV abx. No infectious symptoms - Recent INR 2.6  (goal 2.5-3.5). Coumadin clinic managing.   3. Paroxysmal Atrial Fibrillation - Does not tolerate AF - NSR on ECG today - Continue amiodarone 200 mg daily. - Check amio labs  4. H/o AKI - Due to ATN/shock - Baseline creatinine 0.98 - 1.0.  - transiently required hemodialysis during last admission. - Previous SCr 1.59 - Continue SGLT2i. - BMET today.  5. Autoimmune Disorder  - He has seronegative spondylitis and pyoderma gangrenosum. - Following with Derm at [place] injections   6. 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 Depression - will start Zoloft 25 and then increase to 50.  - Follow-up with PCP  8. Erectile dysfunction - Viagra 50mg  tabs - can increase to 100 as needed  Total time spent 45 minutes. Over half that time spent discussing above.    Glori Bickers, MD 09/11/22

## 2022-09-12 LAB — T3: T3, Total: 85 ng/dL (ref 71–180)

## 2022-09-12 LAB — T4: T4, Total: 7.5 ug/dL (ref 4.5–12.0)

## 2022-09-16 DIAGNOSIS — K219 Gastro-esophageal reflux disease without esophagitis: Secondary | ICD-10-CM | POA: Diagnosis not present

## 2022-09-16 DIAGNOSIS — F418 Other specified anxiety disorders: Secondary | ICD-10-CM | POA: Diagnosis not present

## 2022-09-16 DIAGNOSIS — N529 Male erectile dysfunction, unspecified: Secondary | ICD-10-CM | POA: Diagnosis not present

## 2022-09-16 DIAGNOSIS — R238 Other skin changes: Secondary | ICD-10-CM | POA: Diagnosis not present

## 2022-09-16 DIAGNOSIS — Z79899 Other long term (current) drug therapy: Secondary | ICD-10-CM | POA: Diagnosis not present

## 2022-09-16 DIAGNOSIS — I509 Heart failure, unspecified: Secondary | ICD-10-CM | POA: Diagnosis not present

## 2022-09-16 DIAGNOSIS — L732 Hidradenitis suppurativa: Secondary | ICD-10-CM | POA: Diagnosis not present

## 2022-09-16 DIAGNOSIS — I6930 Unspecified sequelae of cerebral infarction: Secondary | ICD-10-CM | POA: Diagnosis not present

## 2022-09-16 DIAGNOSIS — I499 Cardiac arrhythmia, unspecified: Secondary | ICD-10-CM | POA: Diagnosis not present

## 2022-09-16 DIAGNOSIS — Z952 Presence of prosthetic heart valve: Secondary | ICD-10-CM | POA: Diagnosis not present

## 2022-09-22 ENCOUNTER — Other Ambulatory Visit (HOSPITAL_COMMUNITY): Payer: Self-pay

## 2022-09-22 DIAGNOSIS — D509 Iron deficiency anemia, unspecified: Secondary | ICD-10-CM

## 2022-09-22 DIAGNOSIS — I5022 Chronic systolic (congestive) heart failure: Secondary | ICD-10-CM

## 2022-09-23 ENCOUNTER — Ambulatory Visit: Payer: Medicaid Other | Attending: Internal Medicine | Admitting: *Deleted

## 2022-09-23 DIAGNOSIS — Z952 Presence of prosthetic heart valve: Secondary | ICD-10-CM | POA: Diagnosis not present

## 2022-09-23 DIAGNOSIS — Z5181 Encounter for therapeutic drug level monitoring: Secondary | ICD-10-CM | POA: Diagnosis not present

## 2022-09-23 LAB — POCT INR: INR: 2.7 (ref 2.0–3.0)

## 2022-09-23 NOTE — Patient Instructions (Signed)
Continue on same dosage 1 tablet daily due to minocycline. Recheck in 4 weeks. Starting minocycline 100mg  twice daily 07/23/22.  Can increase INR

## 2022-09-25 ENCOUNTER — Ambulatory Visit (HOSPITAL_COMMUNITY)
Admission: RE | Admit: 2022-09-25 | Discharge: 2022-09-25 | Disposition: A | Discharge status: Home / Self Care | Payer: Medicaid Other | Source: Ambulatory Visit | Attending: Cardiology | Admitting: Cardiology

## 2022-09-25 DIAGNOSIS — I5022 Chronic systolic (congestive) heart failure: Secondary | ICD-10-CM | POA: Insufficient documentation

## 2022-09-25 DIAGNOSIS — D509 Iron deficiency anemia, unspecified: Secondary | ICD-10-CM | POA: Diagnosis not present

## 2022-09-25 LAB — COMPREHENSIVE METABOLIC PANEL
ALT: 17 U/L (ref 0–44)
AST: 38 U/L (ref 15–41)
Albumin: 2.6 g/dL — ABNORMAL LOW (ref 3.5–5.0)
Alkaline Phosphatase: 106 U/L (ref 38–126)
Anion gap: 9 (ref 5–15)
BUN: 15 mg/dL (ref 6–20)
CO2: 24 mmol/L (ref 22–32)
Calcium: 8.9 mg/dL (ref 8.9–10.3)
Chloride: 103 mmol/L (ref 98–111)
Creatinine, Ser: 1.48 mg/dL — ABNORMAL HIGH (ref 0.61–1.24)
GFR, Estimated: >60 mL/min (ref >60)
Glucose, Bld: 69 mg/dL — ABNORMAL LOW (ref 70–99)
Potassium: 3.6 mmol/L (ref 3.5–5.1)
Sodium: 136 mmol/L (ref 135–145)
Total Bilirubin: 0.4 mg/dL (ref 0.3–1.2)
Total Protein: 9.5 g/dL — ABNORMAL HIGH (ref 6.5–8.1)

## 2022-09-25 LAB — IRON AND TIBC
Iron: 31 ug/dL — ABNORMAL LOW (ref 45–182)
Saturation Ratios: 14 % — ABNORMAL LOW (ref 17.9–39.5)
TIBC: 217 ug/dL — ABNORMAL LOW (ref 250–450)
UIBC: 186 ug/dL

## 2022-09-25 LAB — FERRITIN: Ferritin: 131 ng/mL (ref 24–336)

## 2022-10-03 ENCOUNTER — Encounter: Payer: Self-pay | Admitting: Neurology

## 2022-10-14 NOTE — Progress Notes (Signed)
Advanced Heart Failure Clinic Note   PCP: Patient, No Pcp Per Primary Cardiologist: Dina Rich, MD  [facility]: Dr. Gala Romney   HPI: Donald [name] is a 37 y.o. male with history of mitral regurgitation (s/p MV repair with resection of ruptured anterior papillary muscle and reconstruction of papillary chord and placement of annuloplasty ring in 2019). Had recurrent MR and underwent MVR w/ mechanical valve on 06/10/21 by Dr. Florian Buff at [facility].   Admitted 12/22 for a/c CHF and evidence of low output and AKI. Echo with EF 20-25% with severe MR/mod MS and severe RV dysfunction. PICC place, started on empiric milrinone. AKI resolved with addition of milrinone. He underwent MVR w/ mechanical valve on 12/26 by Dr. Florian Buff at [facility].  Admitted 2/23 with low output HF in setting of AF. Echo showed LVEF <20%, RV severely reduced. Mechanical MV ok, Gradient 3 mmHg. Started on milrinone and amio gtt. Diuresed w/ IV Lasix. Diuresed and extubated. Converted to NSR, amio and milrinone weaned off. GDMT titrated. Discharged home, weight was 198 lb.   Echo 10/11/21: EF 25%. Mild RV dysfunction.  Admitted 7/23 with cardiogenic shock. Echo EF 15%, RV sev HK, MVR thick, mean gradient 5-6. Intubated. TEE with severe biventricular failure with vegetation on mechanical MVR. Had CODE stroke. MRI w/ several small acute right MCA territory ischemic strokes, suggestive of a cardioembolic etiology.  Had upper extremity swelling. 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  Ortho consulted and rec transfer to [facility] for Ortho Onc. While at [facility], UE mass found to be hematoma. Kidney function declined and he was transiently on hemodialysis.   Seen in ED 04/05/22 with palpitations. Found to be in SVT vs AFL, and hypotensive. Underwent emergent DCCV to NSR, BP improved.  Echo 12/23 EF 25-30% RV sev HK MVR ok mean gradient 6 (? Mild PPM)  Today he returns for post hospital follow up. Overall feeling fine. Denies palpitations,  CP, dizziness, edema, or PND/Orthopnea. No SOB. Did 2 months of PT but then he never got called back for more therapy. Has f/u with neuro here in [place], no longer at [facility]. Appetite ok, eating primarily frozen meals. Stays active and is able to do errands. Spends his time taking care of his 35 year old and playing with him. No fever or chills. Does not weight at home, scale with no batteries. Taking all medications.    Cardiac Studies: - TEE (7/23): LVEF < 20%, RV severely reduced, mean mitral valve gradient 8 mmHg with trivial MR + vegetation on MV - Echo (7/23): EF < 15%, RV severely dow - Echo (4/23): EF 25%, mild RV dysfunction - Echo (2/23): EF < 20%, severe LV dysfunction with global HK, grade II DD, RV severely reduced, S/p mitral valve repair. MV mean gradient 3 mmHg  - Echo (12/22): EF 20-25%, severe LV dysfunction with global HK, mild LVH, RV  moderately reduced, elevated MV gradient , mild AI - R/LHC (09/11/20):  RA = 7 RV = 49/10 PA = 48/16 (32) PCW = 17 (v=32) Fick cardiac output/index = 5.3/2.5 PVR = 2.9 WU Ao sat = 99% PA sat = 71%, 70% High SVC sat =  75% 1. Normal coronary arteries 2. NICM EF 30-35% 3. Severe MR with prominent v-waves in PCWP tracing 4. Mild pulmonary venous HTN with normal CO  Past Medical History:  Diagnosis Date   Anemia    Autoimmune disorder (HCC)    pyoderma gangrenosum   CHF (congestive heart failure) (HCC)    Chronic  systolic heart failure (HCC)    a. EF 35-40% by echo in 07/2018 b. EF at 45% by repeat echo in 04/2020   DVT (deep venous thrombosis) (HCC)    h/o   Dysrhythmia    Mitral regurgitation    a. 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 b. 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HCC)    arthritis    Shock, septic and cardiogenic 12/28/2021   Tricuspid regurgitation     Current Outpatient Medications  Medication Sig Dispense Refill   acetaminophen (TYLENOL) 325 MG tablet Take 2 tablets (650 mg total) by mouth every 4 (four) hours as needed for headache or mild pain.     amiodarone (PACERONE) 200 MG tablet Take 1 tablet by mouth once daily 30 tablet 0   aspirin 81 MG chewable tablet Chew 1 tablet (81 mg total) by mouth daily. 30 tablet 1   dapagliflozin propanediol (FARXIGA) 10 MG TABS tablet Take 1 tablet (10 mg total) by mouth daily. 30 tablet 1   digoxin (LANOXIN) 0.125 MG tablet Take 1 tablet (0.125 mg total) by mouth daily. 90 tablet 3   feeding supplement (ENSURE ENLIVE / ENSURE PLUS) LIQD Take 237 mLs by mouth 3 (three) times daily between meals. (Patient taking differently: Take 237 mLs by mouth 3 (three) times daily between meals. As needed) 237 mL 12   furosemide (LASIX) 40 MG tablet Take 1 tablet (40 mg total) by mouth 3 (three) times a week. Patient takes 1 tablet by mouth on Monday Wednesday and Fridays. 30 tablet 1   minocycline (MINOCIN) 100 MG capsule Take 100 mg by mouth 2 (two) times daily.     pantoprazole (PROTONIX) 40 MG tablet Take 1 tablet by mouth once daily 30 tablet 0   Potassium Chloride (KLOR-CON PO) 20 mEq. Patient takes on Monday Wednesday and Fridays.     Secukinumab, 300 MG Dose, (COSENTYX, 300 MG DOSE,) 150 MG/ML SOSY Inject into the skin.  every 4 weeks     sertraline (ZOLOFT) 50 MG tablet Take 1 tablet (50 mg total) by mouth daily. 60 tablet 4   sildenafil (VIAGRA) 50 MG tablet Take 1 tablet (50 mg total) by mouth daily as needed for erectile dysfunction. 10 tablet 1   spironolactone (ALDACTONE) 25 MG tablet Take 0.5 tablets (12.5 mg total) by mouth daily. 90 tablet 1   warfarin (COUMADIN) 5 MG tablet Take warfarin 2 - 3 tablets daily or as directed by coumadin clinic 90 tablet 5   No current facility-administered medications for this visit.    No Known  Allergies    Social History   Socioeconomic History   Marital status: Single    Spouse name: Not on file   Number of children: Not on file   Years of education: Not on file   Highest education level: Not on file  Occupational History   Not on file  Tobacco Use   Smoking status: Former    Types: Cigarettes    Quit date: 10/14/2020    Years since quitting: 2.0   Smokeless tobacco: Never  Vaping Use   Vaping Use: Never used  Substance and Sexual Activity   Alcohol use: No   Drug use: No   Sexual activity: Yes  Other Topics Concern  Not on file  Social History Narrative   Not on file   Social Determinants of Health   Financial Resource Strain: Not on file  Food Insecurity: Not on file  Transportation Needs: Not on file  Physical Activity: Not on file  Stress: Not on file  Social Connections: Not on file  Intimate Partner Violence: Not on file   Family History  Problem Relation Age of Onset   Multiple sclerosis Mother    Psoriasis Mother    Depression Father    Diabetes Father    Diabetes Paternal Grandmother    There were no vitals taken for this visit.  Wt Readings from Last 3 Encounters:  09/11/22 96.1 kg (211 lb 12.8 oz)  04/22/22 82.6 kg (182 lb)  04/04/22 89.7 kg (197 lb 12 oz)   PHYSICAL EXAM: General:  well appearing.  No respiratory difficulty. Walked into clinic.  HEENT: normal Neck: supple. JVD difficult to see. Carotids 2+ bilat; no bruits. No lymphadenopathy or thyromegaly appreciated. Cor: PMI nondisplaced. Regular rate & rhythm. No rubs, gallops or murmurs. Lungs: clear, diminished bases Abdomen: soft, nontender, nondistended. No hepatosplenomegaly. No bruits or masses. Good bowel sounds. Extremities: no cyanosis, clubbing, rash, edema  Neuro: alert & oriented x 3, cranial nerves grossly intact. moves extremities w/o difficulty, except still has weakness of the L arm, able to flex fingers some now. Affect pleasant.   ECG: NSR 73 bpm  (personally reviewed).  ReDs clip 44%  ASSESSMENT & PLAN: 1. Chronic HFrEF/Biventricular Failure - Echo (4/23): EF of 20-25% and RV function was severely reduced.  - Echo (7/23): EF 10% RV severely HK. Cardiogenic/septic shock, required NE and milrinone. - TEE (7/23) showed severe BiV failure + vegetation on mechanical MVR. - Today NYHA II. Volume appears stable, however ReDs clip high at 44%.  - Increase Lasix 40 mg MWF to 40 mg daily. - Increase 20 mEq KDUR from MWF to daily - Continue Coreg 3.125 mg bid. - Continue Farxiga 10 mg daily. - Continue spiro 12.5mg  daily - Continue digoxin 0.125mg  daily, check level today (has not had meds yet) - Will not start losartan today with SCr recently elevated and volume elevated today - Iron studies 4/24: tSat 14%, ferritin 131. On iron pills - schedule CPX - Follow closely. If getting worse will need referral back to [facility] for ongoing transplant eval - BMET today   2. 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  - Underwent MVR with mechanical mitral valve in 12/22 at [facility].  - TEE 7/23 severe biventricular failure. + vegetation on mechanical MVR. - Completed 6 weeks IV abx. No infectious symptoms - Recent INR 2.7  (goal 2.5-3.5). Coumadin clinic managing.   3. Paroxysmal Atrial Fibrillation - Does not tolerate AF - NSR on ECG today  - Continue amiodarone 200 mg daily. - thyroid function labs stable 3/24  4. H/o AKI - Due to ATN/shock - Baseline creatinine 0.98 - 1.0.  - transiently required hemodialysis during last admission. - SCr 1.48 recently - Continue SGLT2i. - BMET today.  5. Autoimmune Disorder  - He has seronegative spondylitis and pyoderma gangrenosum. - Following with Derm at [facility] - Continue secukinumab injections   6. Multiple small acute  right MCA territory ischemic strokes - left forearm paralysis. 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 Depression - Continue Zoloft 50 mg daily - Follow-up with PCP  8. Erectile dysfunction - Viagra 50mg  tabs - can increase to 100 as needed  F/u with APP in 7-10 days to reassess volume status. Check BMET again to assess renal function. Need to set up repeat echo at next visit.   Donald Bleacher, NP Advanced Heart Failure Team  10/14/22

## 2022-10-15 ENCOUNTER — Other Ambulatory Visit (HOSPITAL_COMMUNITY): Payer: Self-pay | Admitting: Internal Medicine

## 2022-10-15 ENCOUNTER — Telehealth (HOSPITAL_COMMUNITY): Payer: Self-pay

## 2022-10-15 NOTE — Telephone Encounter (Signed)
Called to confirm/remind patient of their appointment at the Advanced Heart Failure Clinic on 10/16/22.   Patient reminded to bring all medications and/or complete list.  Confirmed patient has transportation. Gave directions, instructed to utilize valet parking.  Confirmed appointment prior to ending call.

## 2022-10-16 ENCOUNTER — Ambulatory Visit (HOSPITAL_COMMUNITY)
Admission: RE | Admit: 2022-10-16 | Discharge: 2022-10-16 | Disposition: A | Discharge status: Home / Self Care | Payer: Medicaid Other | Source: Ambulatory Visit | Attending: Adult Health | Admitting: Adult Health

## 2022-10-16 ENCOUNTER — Encounter (HOSPITAL_COMMUNITY): Payer: Self-pay

## 2022-10-16 VITALS — BP 102/82 | HR 70 | Wt 211.4 lb

## 2022-10-16 DIAGNOSIS — I5022 Chronic systolic (congestive) heart failure: Secondary | ICD-10-CM | POA: Insufficient documentation

## 2022-10-16 DIAGNOSIS — D8989 Other specified disorders involving the immune mechanism, not elsewhere classified: Secondary | ICD-10-CM

## 2022-10-16 DIAGNOSIS — Z7984 Long term (current) use of oral hypoglycemic drugs: Secondary | ICD-10-CM | POA: Insufficient documentation

## 2022-10-16 DIAGNOSIS — I5082 Biventricular heart failure: Secondary | ICD-10-CM | POA: Diagnosis not present

## 2022-10-16 DIAGNOSIS — N179 Acute kidney failure, unspecified: Secondary | ICD-10-CM

## 2022-10-16 DIAGNOSIS — I480 Paroxysmal atrial fibrillation: Secondary | ICD-10-CM | POA: Insufficient documentation

## 2022-10-16 DIAGNOSIS — Z87891 Personal history of nicotine dependence: Secondary | ICD-10-CM | POA: Insufficient documentation

## 2022-10-16 DIAGNOSIS — I69354 Hemiplegia and hemiparesis following cerebral infarction affecting left non-dominant side: Secondary | ICD-10-CM | POA: Diagnosis not present

## 2022-10-16 DIAGNOSIS — Z952 Presence of prosthetic heart valve: Secondary | ICD-10-CM | POA: Diagnosis not present

## 2022-10-16 DIAGNOSIS — Z9889 Other specified postprocedural states: Secondary | ICD-10-CM

## 2022-10-16 DIAGNOSIS — Z7901 Long term (current) use of anticoagulants: Secondary | ICD-10-CM | POA: Diagnosis not present

## 2022-10-16 DIAGNOSIS — Z79899 Other long term (current) drug therapy: Secondary | ICD-10-CM | POA: Insufficient documentation

## 2022-10-16 DIAGNOSIS — Z8673 Personal history of transient ischemic attack (TIA), and cerebral infarction without residual deficits: Secondary | ICD-10-CM

## 2022-10-16 DIAGNOSIS — F32A Depression, unspecified: Secondary | ICD-10-CM | POA: Diagnosis not present

## 2022-10-16 DIAGNOSIS — L88 Pyoderma gangrenosum: Secondary | ICD-10-CM | POA: Diagnosis not present

## 2022-10-16 DIAGNOSIS — I110 Hypertensive heart disease with heart failure: Secondary | ICD-10-CM | POA: Insufficient documentation

## 2022-10-16 DIAGNOSIS — N529 Male erectile dysfunction, unspecified: Secondary | ICD-10-CM | POA: Diagnosis not present

## 2022-10-16 DIAGNOSIS — I081 Rheumatic disorders of both mitral and tricuspid valves: Secondary | ICD-10-CM | POA: Insufficient documentation

## 2022-10-16 DIAGNOSIS — Z86718 Personal history of other venous thrombosis and embolism: Secondary | ICD-10-CM | POA: Insufficient documentation

## 2022-10-16 MED ORDER — POTASSIUM CHLORIDE CRYS ER 20 MEQ PO TBCR: 20.0000 meq | EXTENDED_RELEASE_TABLET | Freq: Every day | ORAL | 8 refills | Status: DC

## 2022-10-16 MED ORDER — PANTOPRAZOLE SODIUM 40 MG PO TBEC: 40.0000 mg | DELAYED_RELEASE_TABLET | Freq: Every day | ORAL | 8 refills | Status: AC

## 2022-10-16 MED ORDER — FUROSEMIDE 40 MG PO TABS: 40.0000 mg | ORAL_TABLET | Freq: Every day | ORAL | 8 refills | Status: DC

## 2022-10-16 NOTE — Progress Notes (Signed)
ReDS Vest / Clip - 10/16/22 1155       ReDS Vest / Clip   Station Marker C    Ruler Value 27    ReDS Value Range High volume overload    ReDS Actual Value 44    Anatomical Comments sitting

## 2022-10-16 NOTE — Patient Instructions (Signed)
Thank you for coming in today  Labs were done today, if any labs are abnormal the clinic will call you No news is good news  Medications: Increase Lasix to 40 mg daily Increase Potassium 20 meq 1 tablet daily  Follow up appointments:  Your physician recommends that you schedule a follow-up appointment in:  10 days in clinic    Do the following things EVERYDAY: Weigh yourself in the morning before breakfast. Write it down and keep it in a log. Take your medicines as prescribed Eat low salt foods--Limit salt (sodium) to 2000 mg per day.  Stay as active as you can everyday Limit all fluids for the day to less than 2 liters   At the [facility], you and your health needs are our priority. As part of our continuing mission to provide you with exceptional heart care, we have created designated Provider Care Teams. These Care Teams include your primary Cardiologist (physician) and Advanced Practice Providers (APPs- Physician Assistants and Nurse Practitioners) who all work together to provide you with the care you need, when you need it.   You may see any of the following providers on your designated Care Team at your next follow up: Dr Arvilla Meres Dr Marca Ancona Dr. Marcos Eke, NP Robbie Lis, [place] [facility] [place], [place] Brynda Peon, NP Karle Plumber, PharmD   Please be sure to bring in all your medications bottles to every appointment.    Thank you for choosing [facility]  If you have any questions or concerns before your next appointment please send [place] a message through [place] or call our office at [phone].    TO LEAVE A MESSAGE FOR THE NURSE SELECT OPTION 2, PLEASE LEAVE A MESSAGE INCLUDING: YOUR NAME DATE OF BIRTH CALL BACK NUMBER REASON FOR CALL**this is important as we prioritize the call backs  YOU WILL RECEIVE A CALL BACK THE SAME DAY AS LONG AS YOU CALL BEFORE 4:00  PM

## 2022-10-22 ENCOUNTER — Other Ambulatory Visit (HOSPITAL_COMMUNITY): Payer: Medicaid Other

## 2022-10-27 ENCOUNTER — Telehealth (HOSPITAL_COMMUNITY): Payer: Self-pay

## 2022-10-27 NOTE — Telephone Encounter (Signed)
Called and left patient a voice message to confirm/remind patient of their appointment at the [facility] on 10/28/22.        

## 2022-10-27 NOTE — Progress Notes (Deleted)
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 ***-handed male with a medical history of HTN, mitral valve replacement c/b endocarditis, SVT, biventricular heart failure, afib, anxiety*** who presents to neurology clinic with ***. The patient is accompanied by ***.  ***  Patient was admitted from 12/27/21 to 01/17/22 for vomiting and diarrhea. He developed shock and was transferred to ICU. He was diagnosed with endocarditis. He then developed left arm weakness. MRI brain showed acute right MCA territory ischemic infarcts, thought to be cardioembolic in etiology.  Patient continues to have no strength in his left arm from the elbow downward. He had an MRI of left humerus on 01/15/22 that showed "large heterogeneous intramuscular collection in the anterior compartment of the left upper extremity, measuring up to 7.2 x 5.7 axially and 18.0 cm in craniocaudal extent...suggestive of cellulitis and pyomyositis."  ***  Doing PT On warfarin Asa 81 mg   MEDICATIONS:  Outpatient Encounter Medications as of 11/05/2022  Medication Sig   acetaminophen (TYLENOL) 325 MG tablet Take 2 tablets (650 mg total) by mouth every 4 (four) hours as needed for headache or mild pain.   amiodarone (PACERONE) 200 MG tablet Take 1 tablet by mouth once daily   aspirin 81 MG chewable tablet Chew 1 tablet (81 mg total) by mouth daily.   dapagliflozin propanediol (FARXIGA) 10 MG TABS tablet Take 1 tablet (10 mg total) by mouth daily.   digoxin (LANOXIN) 0.125 MG tablet Take 1 tablet (0.125 mg total) by mouth daily.   furosemide (LASIX) 40 MG tablet Take 1 tablet (40 mg total) by mouth daily. Patient takes 1 tablet by mouth on Monday Wednesday and Fridays.   minocycline  (MINOCIN) 100 MG capsule Take 100 mg by mouth 2 (two) times daily.   pantoprazole (PROTONIX) 40 MG tablet Take 1 tablet (40 mg total) by mouth daily.   Potassium Chloride (KLOR-CON PO) 20 mEq. Patient takes on Monday Wednesday and Fridays.   potassium chloride SA (KLOR-CON M) 20 MEQ tablet Take 1 tablet (20 mEq total) by mouth daily.   Secukinumab, 300 MG Dose, (COSENTYX, 300 MG DOSE,) 150 MG/ML SOSY Inject into the skin.  every 4 weeks   sertraline (ZOLOFT) 50 MG tablet Take 1 tablet (50 mg total) by mouth daily.   sildenafil (VIAGRA) 50 MG tablet Take 1 tablet (50 mg total) by mouth daily as needed for erectile dysfunction.   spironolactone (ALDACTONE) 25 MG tablet Take 0.5 tablets (12.5 mg total) by mouth daily.   warfarin (COUMADIN) 5 MG tablet Take warfarin 2 - 3 tablets daily or as directed by coumadin clinic   No facility-administered encounter medications on file as of 11/05/2022.    PAST MEDICAL HISTORY: Past Medical History:  Diagnosis Date   Anemia    Autoimmune disorder (HCC)    pyoderma gangrenosum   CHF (congestive heart failure) (HCC)    Chronic systolic heart failure (HCC)    a. EF 35-40% by echo in 07/2018 b. EF at 45% by repeat echo in 04/2020   DVT (deep venous thrombosis) (HCC)    h/o   Dysrhythmia    Mitral regurgitation    a. s/p MV repair with resection of ruptured anterior papillary muscle and reconstruction of papillary chord and  placement of annuloplasty ring in 2019. b. 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HCC)    arthritis   Shock, septic and cardiogenic 12/28/2021   Tricuspid regurgitation     PAST SURGICAL HISTORY: Past Surgical History:  Procedure Laterality Date   CARDIAC CATHETERIZATION     HERNIA REPAIR Right 2018   MITRAL VALVE REPAIR  06/07/2018   [facility] - Dr Meda Klinefelter   MITRAL VALVE REPLACEMENT N/A  06/23/2021   MULTIPLE EXTRACTIONS WITH ALVEOLOPLASTY N/A 11/08/2020   Procedure: EXTRACTION OF TEETH NUMBER ONE, FIFTHTEEN, SIXTEEN, SEVENTEEN, EIGHTTEEN AND THRTY WITH ALVEOLOPLASTY OF LOWER LEFT QUADRANT.;  Surgeon: Sharman Cheek, DMD;  Location: [facility] OR;  Service: Dentistry;  Laterality: N/A;   RIGHT/LEFT HEART CATH AND CORONARY ANGIOGRAPHY N/A 09/11/2020   Procedure: RIGHT/LEFT HEART CATH AND CORONARY ANGIOGRAPHY;  Surgeon: Dolores Patty, MD;  Location: [facility] INVASIVE CV LAB;  Service: Cardiovascular;  Laterality: N/A;   TEE WITHOUT CARDIOVERSION N/A 05/23/2020   Procedure: TRANSESOPHAGEAL ECHOCARDIOGRAM (TEE) WITH PROPOFOL;  Surgeon: [name], MD;  Location: [facility] ENDO SUITE;  Service: Endoscopy;  Laterality: N/A;    ALLERGIES: No Known Allergies  FAMILY HISTORY: Family History  Problem Relation Age of Onset   Multiple sclerosis Mother    Psoriasis Mother    Depression Father    Diabetes Father    Diabetes Paternal Grandmother     SOCIAL HISTORY: Social History   Tobacco Use   Smoking status: Former    Types: Cigarettes    Quit date: 10/14/2020    Years since quitting: 2.0   Smokeless tobacco: Never  Vaping Use   Vaping Use: Never used  Substance Use Topics   Alcohol use: No   Drug use: No   Social History   Social History Narrative   Not on file    Objective:  Vital Signs:  There were no vitals taken for this visit.  ***  Labs and Imaging review: Internal labs: Lab Results  Component Value Date   HGBA1C 5.6 01/10/2022   Lab Results  Component Value Date   VITAMINB12 2,046 (H) 07/28/2021   Lab Results  Component Value Date   TSH 3.252 09/11/2022   ***  External labs: Lipid panel (04/10/22): TG 49, Cholesterol 134, LDL 71, HDL 53 (wnl)  Imaging: MRI brain wo contrast (01/09/22): FINDINGS: Brain: Cerebral volume within normal limits. Mild hazy and patchy T2/FLAIR signal intensity seen involving the periventricular and deep white  matter both cerebral hemispheres, nonspecific, but most characteristic of chronic small vessel ischemic disease, advanced for age. Ischemia. Small remote right cerebellar infarct noted.   Patchy small volume foci of restricted diffusion seen involving the cortical subcortical right frontoparietal region (series 15, images 85, 84, 81), consistent with small acute ischemic infarcts. These are likely embolic in nature. No associated hemorrhage or mass effect. No other evidence for acute or subacute ischemia. No acute intracranial hemorrhage. Multiple scattered chronic micro hemorrhages noted involving the cerebellum and both cerebral hemispheres, nonspecific, but favored to be hypertensive in nature.   No mass lesion, midline shift or mass effect. No hydrocephalus or extra-axial fluid collection. Pituitary gland and suprasellar region within normal limits.   Vascular: Major intracranial vascular flow voids are maintained.   Skull and upper cervical spine: Craniocervical junction within normal limits. Bone marrow signal intensity diffusely decreased on T1 weighted  sequence, nonspecific, but most commonly related to anemia, smoking or obesity. No focal marrow replacing lesion. Few small cystic lesions measuring up to 1.8 cm seen within the visualized left temporal and pre-auricular region, nonspecific, but likely sebaceous cyst (series 27, images 10, 6).   Sinuses/Orbits: Globes and orbital soft tissues within normal limits. Paranasal sinuses are largely clear. Small right mastoid effusion, of doubtful significance.   Other: None.   IMPRESSION: 1. Patchy small volume acute ischemic nonhemorrhagic cortical infarcts involving the subcortical right frontoparietal region. These are likely embolic in nature. 2. Underlying mild chronic microvascular ischemic disease, advanced for age. Small remote right cerebellar infarct. 3. Multiple scattered chronic micro hemorrhages involving  the cerebellum and both cerebral hemispheres, nonspecific, but favored to be secondary to poorly controlled hypertension.  CTA head and neck (01/10/22): FINDINGS: CT HEAD FINDINGS   Brain:   No age advanced or lobar predominant parenchymal atrophy.   Known small acute right frontoparietal lobe infarcts were better appreciated on the brain MRI of 01/09/2022.   Background mild chronic small vessel ischemic changes within the cerebral white matter, also better appreciated on yesterday's MRI.   Redemonstrated small chronic infarct within the right cerebellar hemisphere.   Partially empty sella turcica, a nonspecific finding.   There is no acute intracranial hemorrhage.   No extra-axial fluid collection.   No evidence of an intracranial mass.   No midline shift.   Vascular: No hyperdense vessel.   Skull: No fracture or aggressive osseous lesion.   Sinuses/Orbits: No mass or acute finding within the imaged orbits. No significant paranasal sinus disease.   Other: Small-volume fluid within the right mastoid air cells.   Review of the MIP images confirms the above findings   CTA NECK FINDINGS   Aortic arch: Common origin of the innominate and left common carotid arteries. The visualized aortic arch is normal in caliber. No hemodynamically significant innominate or proximal subclavian artery stenosis.   Right carotid system: CCA and ICA patent within the neck without stenosis or significant atherosclerotic disease.   Left carotid system: CCA and ICA patent within the neck without stenosis or significant atherosclerotic disease. Tortuosity of the cervical ICA.   Vertebral arteries: Vertebral arteries codominant and patent within the neck without stenosis or significant atherosclerotic disease.   Skeleton: No acute fracture or aggressive osseous lesion.   Other neck: No neck mass or cervical lymphadenopathy.   Upper chest: Prior median sternotomy. No consolidation  within the imaged lung apices.   Other: Partially imaged nonspecific edema/stranding within the left axilla.   Review of the MIP images confirms the above findings   CTA HEAD FINDINGS   Anterior circulation:   The intracranial internal carotid arteries are patent. The M1 middle cerebral arteries are patent. No M2 proximal branch occlusion or high-grade proximal stenosis. The anterior cerebral arteries are patent. No intracranial aneurysm is identified.   Posterior circulation:   The intracranial vertebral arteries are patent. The basilar artery is patent. The posterior cerebral arteries are patent. Posterior communicating arteries are present bilaterally.   Venous sinuses: Within the limitations of contrast timing, no convincing thrombus.   Anatomic variants: As described.   Other: Ovoid cystic-appearing lesions within the left temporal and left preauricular soft tissues measuring up to 1.7 cm.   Review of the MIP images confirms the above findings   IMPRESSION: CT head:   1. Known small right frontoparietal lobe acute infarcts were better appreciated on the brain MRI of 01/09/2022. 2. Background mild chronic  small vessel image changes within the cerebral white matter. 3. Redemonstrated small chronic infarcts within the right cerebellar hemisphere. 4. Small right mastoid effusion.   CTA neck:   1. The common carotid, internal carotid and vertebral arteries are patent within the neck without stenosis or significant atherosclerotic disease. 2. Partially imaged nonspecific edema/stranding within the left axilla. Correlate clinically and consider dedicated imaging.   CTA head:   1. No intracranial large vessel occlusion or proximal high-grade arterial stenosis. 2. Cystic-appearing lesions within the left temporal and preauricular soft tissues, measuring up to 1.7 cm. Direct visualization recommended.  MRI left humerus w/wo contrast  (01/15/22): FINDINGS: Bones/Joint/Cartilage   There are changes of marrow reconversion. There is no evidence of acute fracture. No aggressive osseous lesion. 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 No aggressive osseous lesion.   Muscles and Tendons   There is a heterogeneous intramuscular collection primarily in the short head biceps, measuring approximately 7.2 x 5.7 axially and 18.0 cm in craniocaudal extent (series 17 image 34, series 100 image 24). There is peripheral intrinsic T1 hypertensity, thick along the inferior aspect and thin along the superior aspect. There is associated rim enhancement. There are fluid-fluid levels along the superior aspect (series 11 iage 25-26) and thick blooming artifact along the inferior aspect.   Soft tissues   Extensive soft tissue swelling, intramuscular and superficial along the left upper extremity including the axilla, supra/infraclavicular upper chest, along the pectoralis musculature, deltoid, and distally beyond the elbow into the proximal forearm. There is mild soft tissue enhancement. Marked skin thickening medially. Mildly enlarged left axillar lymph node measuring 1.1 cm, likely reactive.   IMPRESSION: Large heterogeneous intramuscular collection in the anterior compartment of the left upper extremity, measuring up to 7.2 x 5.7 axially and 18.0 cm in craniocaudal extent. Extensive associated soft tissue edema throughout the left upper extremity extending to the axilla and lower neck, and distally beyond the elbow into the forearm. Marked skin thickening medially. Findings are suggestive of cellulitis and pyomyositis. Intrinsic T1 signal and blooming artifact suggests the presence of a hemorrhagic component, and this collection could also simply be a large hematoma.   No evidence of osteomyelitis or acute fracture.  Echo (06/05/22): 1. Left ventricular ejection fraction, by estimation, is 25 to 30%. The  left  ventricle has severely decreased function. The left ventricle  demonstrates global hypokinesis. The left ventricular internal cavity size  was mildly dilated. Left ventricular  diastolic parameters are indeterminate.   2. Right ventricular systolic function is severely reduced. The right  ventricular size is normal. Tricuspid regurgitation signal is inadequate  for assessing PA pressure.   3. Left atrial size was mildly dilated.   4. PHT < 130 ms, elevated TVI ratio elevated in the settiong of low LV  stroke volume (there is no pathologic regurgitation seen by color  Doppler), mean gradient of 6 at a heart rate of 68 bpm reflecting, likely,  a degree of patient prosthesis mismatch..   The mitral valve has been repaired/replaced. No evidence of mitral valve  regurgitation. There is a 31 mm [employer] mechanical valve present in the  mitral position. Procedure Date: 06/10/21.   5. The aortic valve is tricuspid. Aortic valve regurgitation is mild.   6. The inferior vena cava is normal in size with greater than 50%  respiratory variability, suggesting right atrial pressure of 3 mmHg.  ***  Assessment/Plan:  Donald [name] is a 37 y.o. male who presents for evaluation of ***. ***  has a relevant medical history of ***. *** neurological examination is pertinent for ***. Available diagnostic data is significant for ***. This constellation of symptoms and objective data would most likely localize to ***. ***  PLAN: -Blood work: *** ***  -Return to clinic ***  The impression above as well as the plan as outlined below were extensively discussed with the patient (in the company of ***) who voiced understanding. All questions were answered to their satisfaction.  The patient was counseled on pertinent fall precautions per the printed material provided today, and as noted under the "Patient Instructions" section below.***  When available, results of the above investigations and possible  further recommendations will be communicated to the patient via telephone/MyChart. Patient to call office if not contacted after expected testing turnaround time.   Total time spent reviewing records, interview, history/exam, documentation, and coordination of care on day of encounter:  *** min   Thank you for allowing me to participate in patient's care.  If I can answer any additional questions, I would be pleased to do so.  Jacquelyne Balint, MD   CC: Patient, No Pcp Per No address on file  CC: Referring provider: Shelby Dubin, FNP [address] [place],  [place] [address]

## 2022-10-28 ENCOUNTER — Encounter (HOSPITAL_COMMUNITY): Payer: Medicaid Other

## 2022-10-28 ENCOUNTER — Encounter (HOSPITAL_COMMUNITY): Payer: Self-pay

## 2022-10-28 ENCOUNTER — Ambulatory Visit (HOSPITAL_COMMUNITY)
Admission: RE | Admit: 2022-10-28 | Discharge: 2022-10-28 | Disposition: A | Discharge status: Home / Self Care | Payer: Medicaid Other | Source: Ambulatory Visit | Attending: Internal Medicine | Admitting: Internal Medicine

## 2022-10-28 ENCOUNTER — Ambulatory Visit (INDEPENDENT_AMBULATORY_CARE_PROVIDER_SITE_OTHER): Payer: Medicaid Other | Admitting: *Deleted

## 2022-10-28 VITALS — BP 106/86 | HR 69 | Wt 214.6 lb

## 2022-10-28 DIAGNOSIS — Z5181 Encounter for therapeutic drug level monitoring: Secondary | ICD-10-CM | POA: Diagnosis not present

## 2022-10-28 DIAGNOSIS — I5022 Chronic systolic (congestive) heart failure: Secondary | ICD-10-CM | POA: Diagnosis not present

## 2022-10-28 DIAGNOSIS — N179 Acute kidney failure, unspecified: Secondary | ICD-10-CM | POA: Diagnosis not present

## 2022-10-28 DIAGNOSIS — Z7984 Long term (current) use of oral hypoglycemic drugs: Secondary | ICD-10-CM | POA: Insufficient documentation

## 2022-10-28 DIAGNOSIS — F32A Depression, unspecified: Secondary | ICD-10-CM | POA: Diagnosis not present

## 2022-10-28 DIAGNOSIS — Z8673 Personal history of transient ischemic attack (TIA), and cerebral infarction without residual deficits: Secondary | ICD-10-CM | POA: Diagnosis not present

## 2022-10-28 DIAGNOSIS — I5082 Biventricular heart failure: Secondary | ICD-10-CM | POA: Insufficient documentation

## 2022-10-28 DIAGNOSIS — I480 Paroxysmal atrial fibrillation: Secondary | ICD-10-CM | POA: Insufficient documentation

## 2022-10-28 DIAGNOSIS — Z9889 Other specified postprocedural states: Secondary | ICD-10-CM | POA: Diagnosis not present

## 2022-10-28 DIAGNOSIS — Z7901 Long term (current) use of anticoagulants: Secondary | ICD-10-CM | POA: Insufficient documentation

## 2022-10-28 DIAGNOSIS — Z952 Presence of prosthetic heart valve: Secondary | ICD-10-CM

## 2022-10-28 DIAGNOSIS — M4680 Other specified inflammatory spondylopathies, site unspecified: Secondary | ICD-10-CM | POA: Diagnosis not present

## 2022-10-28 DIAGNOSIS — L88 Pyoderma gangrenosum: Secondary | ICD-10-CM | POA: Insufficient documentation

## 2022-10-28 DIAGNOSIS — Z833 Family history of diabetes mellitus: Secondary | ICD-10-CM | POA: Insufficient documentation

## 2022-10-28 DIAGNOSIS — D8989 Other specified disorders involving the immune mechanism, not elsewhere classified: Secondary | ICD-10-CM

## 2022-10-28 DIAGNOSIS — Z7982 Long term (current) use of aspirin: Secondary | ICD-10-CM | POA: Insufficient documentation

## 2022-10-28 DIAGNOSIS — Z86718 Personal history of other venous thrombosis and embolism: Secondary | ICD-10-CM | POA: Insufficient documentation

## 2022-10-28 DIAGNOSIS — Z79899 Other long term (current) drug therapy: Secondary | ICD-10-CM | POA: Diagnosis not present

## 2022-10-28 DIAGNOSIS — I110 Hypertensive heart disease with heart failure: Secondary | ICD-10-CM | POA: Insufficient documentation

## 2022-10-28 DIAGNOSIS — N529 Male erectile dysfunction, unspecified: Secondary | ICD-10-CM | POA: Diagnosis not present

## 2022-10-28 LAB — PROTIME-INR
INR: 2.4 — ABNORMAL HIGH (ref 0.8–1.2)
Prothrombin Time: 26.3 seconds — ABNORMAL HIGH (ref 11.4–15.2)

## 2022-10-28 LAB — BASIC METABOLIC PANEL
Anion gap: 7 (ref 5–15)
BUN: 12 mg/dL (ref 6–20)
CO2: 25 mmol/L (ref 22–32)
Calcium: 8.8 mg/dL — ABNORMAL LOW (ref 8.9–10.3)
Chloride: 101 mmol/L (ref 98–111)
Creatinine, Ser: 1.15 mg/dL (ref 0.61–1.24)
GFR, Estimated: >60 mL/min (ref >60)
Glucose, Bld: 89 mg/dL (ref 70–99)
Potassium: 3.8 mmol/L (ref 3.5–5.1)
Sodium: 133 mmol/L — ABNORMAL LOW (ref 135–145)

## 2022-10-28 LAB — DIGOXIN LEVEL: Digoxin Level: 0.4 ng/mL — ABNORMAL LOW (ref 0.8–2.0)

## 2022-10-28 LAB — BRAIN NATRIURETIC PEPTIDE: B Natriuretic Peptide: 346.4 pg/mL — ABNORMAL HIGH (ref 0.0–100.0)

## 2022-10-28 NOTE — Progress Notes (Signed)
ReDS Vest / Clip - 10/28/22 1200       ReDS Vest / Clip   Station Marker C    Ruler Value 26.5    ReDS Value Range Moderate volume overload    ReDS Actual Value 38

## 2022-10-28 NOTE — Patient Instructions (Addendum)
It was great to see you today! No medication changes are needed at this time.   Labs today We will only contact you if something comes back abnormal or we need to make some changes. Otherwise no news is good news!  Your physician recommends that you schedule a follow-up appointment in: 4-6 months with Dr Gala Romney and echo  Your physician has requested that you have an echocardiogram. Echocardiography is a painless test that uses sound waves to create images of your heart. It provides your doctor with information about the size and shape of your heart and how well your heart's chambers and valves are working. This procedure takes approximately one hour. There are no restrictions for this procedure. Please do NOT wear cologne, perfume, aftershave, or lotions (deodorant is allowed). Please arrive 15 minutes prior to your appointment time.  Do the following things EVERYDAY: Weigh yourself in the morning before breakfast. Write it down and keep it in a log. Take your medicines as prescribed Eat low salt foods--Limit salt (sodium) to 2000 mg per day.  Stay as active as you can everyday Limit all fluids for the day to less than 2 liters  At the [facility], you and your health needs are our priority. As part of our continuing mission to provide you with exceptional heart care, we have created designated Provider Care Teams. These Care Teams include your primary Cardiologist (physician) and Advanced Practice Providers (APPs- Physician Assistants and Nurse Practitioners) who all work together to provide you with the care you need, when you need it.   You may see any of the following providers on your designated Care Team at your next follow up: Dr Arvilla Meres Dr Marca Ancona Dr. Marcos Eke, NP Robbie Lis, [place] [facility] [place], [place] Brynda Peon, NP Karle Plumber, PharmD   Please be sure to bring in all your medications bottles to every  appointment.    Thank you for choosing [facility]   If you have any questions or concerns before your next appointment please send [place] a message through [place] or call our office at [phone].    TO LEAVE A MESSAGE FOR THE NURSE SELECT OPTION 2, PLEASE LEAVE A MESSAGE INCLUDING: YOUR NAME DATE OF BIRTH CALL BACK NUMBER REASON FOR CALL**this is important as we prioritize the call backs  YOU WILL RECEIVE A CALL BACK THE SAME DAY AS LONG AS YOU CALL BEFORE 4:00 PM    You are scheduled for a Cardiopulmonary Exercise (CPX) Test as [facility] on: Date:     Time:   Expect to be in the lab for 2 hours. Please plan to arrive 30 minutes prior to your appointment. You may be asked to reschedule your test if you arrive 20 minutes or more after your scheduled appointment time.  Main Campus address: [address] [place], [place] [address] You may arrive to the Main Entrance A or Entrance C (free valet parking is available at both). -Main Entrance A (on [employer] [address]) :proceed to admitting for check in -Entrance C (on [facility]): proceed to [employer] parking or under hospital deck parking using this code _________  Check In: [facility] waiting room (1st floor)   General Instructions for the day of the test (Please follow all instructions from your physician): Refrain from ingesting a heavy meal, alcohol, or caffeine or using tobacco products within 2 hours of the test (DO NOT FAST for mare  than 8 hours). You may have all other non-alcoholic, non -caffeinated beverage,a light snack (crackers,a piece of fruit, carrot sticks, toast bagel,etc) up to your appointment. Avoid significant exertion or exercise within 24 hours of your test. Be prepared to exercise and sweat. Your clothing should permit freedom of movement and include walking or running shoes. Women bring loose fitting short sleeved blouse.  This evaluation may be fatiguing  and you may wish ti have someone accompany you to the assessment to drive you home afterward. Bring a list of your medications with you, including dosage and frequency you take the medications (  I.e.,once per day, twice per day, etc). Take all medications as prescribed, unless noted below or instructed to do so by your physician.  Please do not take the following medications prior to your CPX:  _________________________________________________  _________________________________________________  Brief description of the test: A brief lung test will be performed. This will involve you taking deep breaths and blowing hard and fast through your mouth. During these , a clip will be on your nose and you will be breathing through a breathing device.   For the exercise portion of the test you will be walking on a treadmill, or riding a stationary bike, to your maximal effor or until symptoms such as chest pain, shortness of breath, leg pain or dizziness limit your exercise. You will be breathing in and out of a breathing device through your mouth (a clip will be on your nose again). Your heart rate, ECG, blood pressure, oxygen saturations, breathing rate and depth, amount of oxygen you consume and amount of carbon dioxide you produce will be measured and monitored throughout the exercise test.  If you need to cancel or reschedule your appointment please call [phone] If you have further questions please call your physician or Philip Aspen, MS, ACSM-RCEP at [phone]

## 2022-10-28 NOTE — Progress Notes (Signed)
Advanced Heart Failure Clinic Note   PCP: Patient, No Pcp Per Primary Cardiologist: Dina Rich, MD  [facility]: Dr. Gala Romney   HPI: Donald [name] is a 37 y.o. male with history of mitral regurgitation (s/p MV repair with resection of ruptured anterior papillary muscle and reconstruction of papillary chord and placement of annuloplasty ring in 2019). Had recurrent MR and underwent MVR w/ mechanical valve on 06/10/21 by Dr. Florian Buff at [facility].   Admitted 12/22 for a/c CHF and evidence of low output and AKI. Echo with EF 20-25% with severe MR/mod MS and severe RV dysfunction. PICC place, started on empiric milrinone. AKI resolved with addition of milrinone. He underwent MVR w/ mechanical valve on 12/26 by Dr. Florian Buff at [facility].  Admitted 2/23 with low output HF in setting of AF. Echo showed LVEF <20%, RV severely reduced. Mechanical MV ok, Gradient 3 mmHg. Started on milrinone and amio gtt. Diuresed w/ IV Lasix. Diuresed and extubated. Converted to NSR, amio and milrinone weaned off. GDMT titrated. Discharged home, weight was 198 lb.   Echo 10/11/21: EF 25%. Mild RV dysfunction.  Admitted 7/23 with cardiogenic shock. Echo EF 15%, RV sev HK, MVR thick, mean gradient 5-6. Intubated. TEE with severe biventricular failure with vegetation on mechanical MVR. Had CODE stroke. MRI w/ several small acute right MCA territory ischemic strokes, suggestive of a cardioembolic etiology.  Had upper extremity swelling. 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  Ortho consulted and rec transfer to [facility] for Ortho Onc. While at [facility], UE mass found to be hematoma. Kidney function declined and he was transiently on hemodialysis.   Seen in ED 04/05/22 with palpitations. Found to be in SVT vs AFL, and hypotensive. Underwent emergent DCCV to NSR, BP improved.  Echo 12/23 EF 25-30% RV sev HK MVR ok mean gradient 6 (? Mild PPM)  Today he returns for AHF follow up. Seen last week and was volume overloaded with ReDs  at 44%, unable to get labs as he was a very hard stick. Overall feeling good. Denies palpitations, CP, dizziness, edema, or PND/Orthopnea. No SOB. Appetite ok. No fever or chills. Weight at home around 210 lbs. Taking all medications.   Cardiac Studies: - Echo (12/23) EF 25-30%, RV severely reduced - TEE (7/23): LVEF < 20%, RV severely reduced, mean mitral valve gradient 8 mmHg with trivial MR + vegetation on MV - Echo (7/23): EF < 15%, RV severely dow - Echo (4/23): EF 25%, mild RV dysfunction - Echo (2/23): EF < 20%, severe LV dysfunction with global HK, grade II DD, RV severely reduced, S/p mitral valve repair. MV mean gradient 3 mmHg  - Echo (12/22): EF 20-25%, severe LV dysfunction with global HK, mild LVH, RV  moderately reduced, elevated MV gradient , mild AI - R/LHC (09/11/20):  RA = 7 RV = 49/10 PA = 48/16 (32) PCW = 17 (v=32) Fick cardiac output/index = 5.3/2.5 PVR = 2.9 WU Ao sat = 99% PA sat = 71%, 70% High SVC sat =  75% 1. Normal coronary arteries 2. NICM EF 30-35% 3. Severe MR with prominent v-waves in PCWP tracing 4. Mild pulmonary venous HTN with normal CO  Past Medical History:  Diagnosis Date   Anemia    Autoimmune disorder (HCC)    pyoderma gangrenosum   CHF (congestive heart failure) (HCC)    Chronic systolic heart failure (HCC)    a. EF 35-40% by echo in 07/2018 b. EF at 45% by repeat echo in 04/2020   DVT (deep  venous thrombosis) (HCC)    h/o   Dysrhythmia    Mitral regurgitation    a. 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 b. 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HCC)    arthritis   Shock, septic and cardiogenic 12/28/2021   Tricuspid regurgitation     Current Outpatient Medications  Medication Sig Dispense Refill    acetaminophen (TYLENOL) 325 MG tablet Take 2 tablets (650 mg total) by mouth every 4 (four) hours as needed for headache or mild pain.     amiodarone (PACERONE) 200 MG tablet Take 1 tablet by mouth once daily 30 tablet 6   aspirin 81 MG chewable tablet Chew 1 tablet (81 mg total) by mouth daily. 30 tablet 1   dapagliflozin propanediol (FARXIGA) 10 MG TABS tablet Take 1 tablet (10 mg total) by mouth daily. 30 tablet 1   digoxin (LANOXIN) 0.125 MG tablet Take 1 tablet (0.125 mg total) by mouth daily. 90 tablet 3   furosemide (LASIX) 40 MG tablet Take 40 mg by mouth daily.     minocycline (MINOCIN) 100 MG capsule Take 100 mg by mouth 2 (two) times daily.     pantoprazole (PROTONIX) 40 MG tablet Take 1 tablet (40 mg total) by mouth daily. 30 tablet 8   Potassium Chloride (KLOR-CON PO) 20 mEq. Patient takes on Monday Wednesday and Fridays.     potassium chloride SA (KLOR-CON M) 20 MEQ tablet Take 1 tablet (20 mEq total) by mouth daily. 30 tablet 8   Secukinumab, 300 MG Dose, (COSENTYX, 300 MG DOSE,) 150 MG/ML SOSY Inject into the skin.  every 4 weeks     sertraline (ZOLOFT) 50 MG tablet Take 1 tablet (50 mg total) by mouth daily. 60 tablet 4   sildenafil (VIAGRA) 50 MG tablet Take 1 tablet (50 mg total) by mouth daily as needed for erectile dysfunction. 10 tablet 1   spironolactone (ALDACTONE) 25 MG tablet Take 0.5 tablets (12.5 mg total) by mouth daily. 90 tablet 1   warfarin (COUMADIN) 5 MG tablet Take warfarin 2 - 3 tablets daily or as directed by coumadin clinic 90 tablet 5   No current facility-administered medications for this encounter.    No Known Allergies    Social History   Socioeconomic History   Marital status: Single    Spouse name: Not on file   Number of children: Not on file   Years of education: Not on file   Highest education level: Not on file  Occupational History   Not on file  Tobacco Use   Smoking status: Former    Types: Cigarettes    Quit date: 10/14/2020     Years since quitting: 2.0   Smokeless tobacco: Never  Vaping Use   Vaping Use: Never used  Substance and Sexual Activity   Alcohol use: No   Drug use: No   Sexual activity: Yes  Other Topics Concern   Not on file  Social History Narrative   Not on file   Social Determinants of Health   Financial Resource Strain: Not on file  Food Insecurity: Not on file  Transportation Needs: Not on file  Physical Activity: Not on file  Stress: Not on file  Social Connections: Not on file  Intimate Partner Violence: Not  on file   Family History  Problem Relation Age of Onset   Multiple sclerosis Mother    Psoriasis Mother    Depression Father    Diabetes Father    Diabetes Paternal Grandmother    BP 106/86   Pulse 69   Wt 97.3 kg (214 lb 9.6 oz)   SpO2 99%   BMI 30.79 kg/m   Wt Readings from Last 3 Encounters:  10/28/22 97.3 kg (214 lb 9.6 oz)  10/16/22 95.9 kg (211 lb 6.4 oz)  09/11/22 96.1 kg (211 lb 12.8 oz)   PHYSICAL EXAM: General:  well appearing.  No respiratory difficulty HEENT: normal Neck: supple. JVD ~8 cm. Carotids 2+ bilat; no bruits. No lymphadenopathy or thyromegaly appreciated. Cor: PMI nondisplaced. Regular rate & rhythm. No rubs, gallops or murmurs. Lungs: clear Abdomen: soft, nontender, nondistended. No hepatosplenomegaly. No bruits or masses. Good bowel sounds. Extremities: no cyanosis, clubbing, rash, edema  Neuro: alert & oriented x 3, cranial nerves grossly intact. moves all 4 extremities w/o difficulty. Affect pleasant.   ECG: NSR 73 bpm (personally reviewed from 10/16/22).  ReDs clip 38%   ASSESSMENT & PLAN: 1. Chronic HFrEF/Biventricular Failure - Echo (4/23): EF of 20-25% and RV function was severely reduced.  - Echo (7/23): EF 10% RV severely HK. Cardiogenic/septic shock, required NE and milrinone. - TEE (7/23) showed severe BiV failure + vegetation on mechanical MVR. - Echo (12/23) EF 25-30%, RV severely reduced - Today NYHA I-II. Volume  improving on current regimen, will continue.  - Continue Lasix 40 mg daily + KDUR daily - Continue Coreg 3.125 mg bid. - Continue Farxiga 10 mg daily. - Continue spiro 12.5mg  daily - Continue digoxin 0.125mg  daily, check level today, has not had meds yet - Will not start losartan today with SCr recently elevated - Iron studies 4/24: tSat 14%, ferritin 131. On iron pills - needs to schedule CPX - Follow closely. If getting worse will need referral back to [facility] for ongoing transplant eval - update echo with next visit - Will retry to get labs today. Check BMET/BNP and Dig trough   2. 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  - Underwent MVR with mechanical mitral valve in 12/22 at [facility].  - TEE 7/23 severe biventricular failure. + vegetation on mechanical MVR. - Completed 6 weeks IV abx. No infectious symptoms - Recent INR 2.7  (goal 2.5-3.5). Coumadin clinic managing. Will check INR today and forward to them  3. Paroxysmal Atrial Fibrillation - Does not tolerate AF - Continue amiodarone 200 mg daily. - thyroid function labs stable 3/24  4. H/o AKI - Due to ATN/shock - Baseline creatinine 0.98 - 1.0.  - transiently required hemodialysis during last admission. - SCr 1.48 recently - Continue SGLT2i. - BMET today.  5. Autoimmune Disorder  - He has seronegative spondylitis and pyoderma gangrenosum. - Following with Derm at [facility] - Continue secukinumab injections   6. 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 Depression - Continue Zoloft 50 mg daily - Follow-up with PCP  8. Erectile dysfunction - Viagra 50mg  tabs - can increase to 100 as needed  Follow up in  4-6 months with Dr. Gala Romney. + echo  Elham Fini L  Trisha Mangle, NP Advanced Heart Failure Team  10/28/22

## 2022-10-28 NOTE — Patient Instructions (Signed)
Take warfarin 1 1/2 tablets tonight then resume 1 tablet daily due to minocycline. Recheck in 4 weeks. Started minocycline 100mg  twice daily 07/23/22.  Can increase INR

## 2022-11-04 ENCOUNTER — Encounter (HOSPITAL_COMMUNITY): Payer: Medicaid Other

## 2022-11-05 ENCOUNTER — Ambulatory Visit: Payer: Medicaid Other | Admitting: Neurology

## 2022-11-11 ENCOUNTER — Other Ambulatory Visit (HOSPITAL_COMMUNITY): Payer: Self-pay | Admitting: *Deleted

## 2022-11-11 ENCOUNTER — Ambulatory Visit (HOSPITAL_COMMUNITY): Payer: Medicaid Other | Attending: Internal Medicine

## 2022-11-11 DIAGNOSIS — D8989 Other specified disorders involving the immune mechanism, not elsewhere classified: Secondary | ICD-10-CM | POA: Insufficient documentation

## 2022-11-11 DIAGNOSIS — Z87891 Personal history of nicotine dependence: Secondary | ICD-10-CM | POA: Diagnosis not present

## 2022-11-11 DIAGNOSIS — I5022 Chronic systolic (congestive) heart failure: Secondary | ICD-10-CM | POA: Insufficient documentation

## 2022-11-11 DIAGNOSIS — D649 Anemia, unspecified: Secondary | ICD-10-CM | POA: Diagnosis not present

## 2022-11-11 DIAGNOSIS — I081 Rheumatic disorders of both mitral and tricuspid valves: Secondary | ICD-10-CM | POA: Insufficient documentation

## 2022-11-11 DIAGNOSIS — I252 Old myocardial infarction: Secondary | ICD-10-CM | POA: Diagnosis not present

## 2022-11-11 DIAGNOSIS — Z86718 Personal history of other venous thrombosis and embolism: Secondary | ICD-10-CM | POA: Insufficient documentation

## 2022-11-11 DIAGNOSIS — I5084 End stage heart failure: Secondary | ICD-10-CM | POA: Diagnosis not present

## 2022-11-20 NOTE — Progress Notes (Signed)
Initial neurology clinic note  Donald [name] MRN: [id number] DOB: 05/03/1986  Referring provider: No ref. provider found  Primary care provider: Shelby Dubin, FNP  Reason for consult:  Right hemispheric strokes (2023) and left arm weakness  Subjective:  This is Donald [name], a 37 y.o. 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 The patient is alone today.  Patient was admitted from 12/27/21 to 01/17/22 for vomiting and diarrhea. He developed shock and was transferred to ICU. He was diagnosed with endocarditis. He then developed left arm weakness from the elbow down to hand on the day he was to be discharged. He had numbness and tingling. He describes a gradual weakness over a couple of days. He also describes shooting pains into his arm.  MRI brain on 01/09/22 showed acute right MCA territory ischemic infarcts, thought to be cardioembolic in etiology. Patient denies any other neurologic symptoms in other extremities, face droop, vision changes, speech problems.  He had an MRI of left humerus on 01/15/22 that showed "large heterogeneous intramuscular collection in the anterior compartment of the left upper extremity, measuring up to 7.2 x 5.7 axially and 18.0 cm in craniocaudal extent...suggestive of cellulitis and pyomyositis." This has since resolved after surgery per patient (surgery was 01/2022).  Patient has done PT, last the beginning of 2024. He has seen some improvement but still weak. He has pins like sensation and numbness in the arm. He only gets occasional shooting pains. The pain is worse when he touches the arm and at night. He is not on any nerve pain medications.  Patient is on warfarin and reports taking it daily. He is also on aspirin 81 mg  daily per cardiology.  EtOH use: none Restriction diet: No   MEDICATIONS:  Outpatient Encounter Medications as of 11/27/2022  Medication Sig   amiodarone (PACERONE) 200 MG tablet Take 1 tablet by mouth once daily   aspirin 81 MG chewable tablet Chew 1 tablet (81 mg total) by mouth daily.   dapagliflozin propanediol (FARXIGA) 10 MG TABS tablet Take 1 tablet (10 mg total) by mouth daily.   digoxin (LANOXIN) 0.125 MG tablet Take 1 tablet (0.125 mg total) by mouth daily.   furosemide (LASIX) 40 MG tablet Take 40 mg by mouth daily.   minocycline (MINOCIN) 100 MG capsule Take 100 mg by mouth 2 (two) times daily.   pantoprazole (PROTONIX) 40 MG tablet Take 1 tablet (40 mg total) by mouth daily.   Potassium Chloride (KLOR-CON PO) 20 mEq. Patient takes on Monday Wednesday and Fridays.   potassium chloride SA (KLOR-CON M) 20 MEQ tablet Take 1 tablet (20 mEq total) by mouth daily.   Secukinumab, 300 MG Dose, (COSENTYX, 300 MG DOSE,) 150 MG/ML SOSY Inject into the skin.  every 4 weeks   sertraline (ZOLOFT) 50 MG tablet Take 1 tablet (50 mg total) by mouth daily.   sildenafil (VIAGRA) 50 MG tablet Take 1 tablet (50 mg total) by mouth daily as needed for erectile dysfunction.   spironolactone (ALDACTONE) 25 MG tablet Take 0.5 tablets (12.5 mg total) by mouth daily.   warfarin (COUMADIN) 5 MG tablet Take warfarin 2 - 3 tablets daily or as directed by coumadin clinic   acetaminophen (TYLENOL) 325 MG tablet Take 2 tablets (650 mg total) by mouth every 4 (four)  hours as needed for headache or mild pain.   No facility-administered encounter medications on file as of 11/27/2022.    PAST MEDICAL HISTORY: Past Medical History:  Diagnosis Date   Anemia    Autoimmune disorder (HCC)    pyoderma gangrenosum   CHF (congestive heart failure) (HCC)    Chronic systolic heart failure (HCC)    a. EF 35-40% by echo in 07/2018 b. EF at 45% by repeat echo in 04/2020   DVT (deep venous thrombosis) (HCC)    h/o    Dysrhythmia    Mitral regurgitation    a. 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 b. 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HCC)    arthritis   Shock, septic and cardiogenic 12/28/2021   Tricuspid regurgitation     PAST SURGICAL HISTORY: Past Surgical History:  Procedure Laterality Date   CARDIAC CATHETERIZATION     HERNIA REPAIR Right 2018   MITRAL VALVE REPAIR  06/07/2018   [facility] - Dr Meda Klinefelter   MITRAL VALVE REPLACEMENT N/A 06/23/2021   MULTIPLE EXTRACTIONS WITH ALVEOLOPLASTY N/A 11/08/2020   Procedure: EXTRACTION OF TEETH NUMBER ONE, FIFTHTEEN, SIXTEEN, SEVENTEEN, EIGHTTEEN AND THRTY WITH ALVEOLOPLASTY OF LOWER LEFT QUADRANT.;  Surgeon: Sharman Cheek, DMD;  Location: [facility] OR;  Service: Dentistry;  Laterality: N/A;   RIGHT/LEFT HEART CATH AND CORONARY ANGIOGRAPHY N/A 09/11/2020   Procedure: RIGHT/LEFT HEART CATH AND CORONARY ANGIOGRAPHY;  Surgeon: Dolores Patty, MD;  Location: [facility] INVASIVE CV LAB;  Service: Cardiovascular;  Laterality: N/A;   TEE WITHOUT CARDIOVERSION N/A 05/23/2020   Procedure: TRANSESOPHAGEAL ECHOCARDIOGRAM (TEE) WITH PROPOFOL;  Surgeon: [name], MD;  Location: [facility] ENDO SUITE;  Service: Endoscopy;  Laterality: N/A;    ALLERGIES: No Known Allergies  FAMILY HISTORY: Family History  Problem Relation Age of Onset   Multiple sclerosis Mother    Psoriasis Mother    Depression Father    Diabetes Father    Diabetes Paternal Grandmother     SOCIAL HISTORY: Social History   Tobacco Use   Smoking status: Former    Types: Cigarettes    Quit date: 10/14/2020    Years since quitting: 2.1   Smokeless tobacco: Never  Vaping Use   Vaping Use: Never used  Substance Use Topics   Alcohol use: No   Drug  use: No   Social History   Social History Narrative   Left handed     Objective:  Vital Signs:  BP (!) 152/93   Pulse 66   Ht 5\' 9"  (1.753 m)   Wt 220 lb (99.8 kg)   SpO2 99%   BMI 32.49 kg/m   General: General appearance: Awake and alert. No distress. Cooperative with exam.  Skin: No obvious rash or jaundice. HEENT: Atraumatic. Anicteric. Lungs: Non-labored breathing on room air  Heart: Regular. No carotid bruits Extremities: No edema.  Musculoskeletal: No obvious joint swelling. Psych: Affect appropriate.  Neurological: Mental Status: Alert. Speech fluent. Able to name and repeat. No pseudobulbar affect Cranial Nerves: CNII: No RAPD. Visual fields intact. CNIII, IV, VI: PERRL. No nystagmus. EOMI. CN V: Facial sensation intact bilaterally to fine touch. CN VII: Facial muscles symmetric and strong. No ptosis at rest. CN VIII: Hears finger rub well bilaterally. CN IX:  No hypophonia. CN X: Palate elevates symmetrically. CN XI: Full strength shoulder shrug bilaterally. CN XII: Tongue protrusion full and midline. No atrophy or fasciculations. No significant dysarthria Motor: Tone is normal in RUE, RLE, LLE. LUE is difficult to assess due to guarding but there is no increased tone. No fasciculations in extremities. Left arm atrophy below the elbow.  Individual muscle group testing (MRC grade out of 5):  Movement     Neck flexion 5    Neck extension 5     Right Left   Shoulder abduction 5 5   Shoulder adduction 5 5   Shoulder ext rotation 5 5   Shoulder int rotation 5 5   Elbow flexion 5 4+   Elbow extension 5 5   Wrist extension 5 2   Wrist flexion 5 0   Finger abduction - FDI 5 0   Finger abduction - ADM 5 0   Finger extension 5 1   Finger distal flexion - 2/3 5 1    Finger distal flexion - 4/5 5 3    Thumb flexion - FPL 5 0   Thumb abduction - APB 5 0    Hip flexion 5 5   Hip extension 5 5   Hip adduction 5 5   Hip abduction 5 5   Knee extension 5 5    Knee flexion 5 5   Dorsiflexion 5 5   Plantarflexion 5 5     Reflexes:  Right Left   Bicep 2+ 2+   Tricep 2+ 2+   BrRad 2+ 0   Knee 2+ 2+   Ankle 2+ 2+    Pathological Reflexes: Babinski: flexor response bilaterally Hoffman: absent bilaterally Troemner: absent bilaterally Sensation: Pinprick: absent in left arm below the elbow, otherwise intact Coordination: Intact finger-to- nose-finger bilaterally. Romberg negative. Gait: Able to rise from chair with arms crossed unassisted. Normal, narrow-based gait.    Labs and Imaging review: Internal labs: Lab Results  Component Value Date   HGBA1C 5.6 01/10/2022   Lab Results  Component Value Date   VITAMINB12 2,046 (H) 07/28/2021   Lab Results  Component Value Date   TSH 3.252 09/11/2022    External labs: Lipid panel (04/10/22): TG 49, Cholesterol 134, LDL 71, HDL 53 (wnl)  Imaging: MRI brain wo contrast (01/09/22): FINDINGS: Brain: Cerebral volume within normal limits. Mild hazy and patchy T2/FLAIR signal intensity seen involving the periventricular and deep white matter both cerebral hemispheres, nonspecific, but most characteristic of chronic small vessel ischemic disease, advanced for age. Ischemia. Small remote right cerebellar infarct noted.   Patchy small volume foci of restricted diffusion seen involving the cortical subcortical right frontoparietal region (series 15, images 85, 84, 81), consistent with small acute ischemic infarcts. These are likely embolic in nature. No associated hemorrhage or mass effect. No other evidence for acute or subacute ischemia. No acute intracranial hemorrhage. Multiple scattered chronic micro hemorrhages noted involving the cerebellum and both cerebral hemispheres, nonspecific, but favored to be hypertensive in nature.   No mass lesion, midline shift or mass effect. No hydrocephalus or extra-axial fluid collection. Pituitary gland and suprasellar region within normal  limits.   Vascular: Major intracranial vascular flow voids are maintained.   Skull and upper cervical spine: Craniocervical junction within normal limits. Bone marrow signal intensity diffusely decreased on T1 weighted sequence, nonspecific, but most commonly related to anemia, smoking or obesity. No focal marrow replacing lesion. Few small cystic lesions measuring up to 1.8 cm seen within the visualized left  temporal and pre-auricular region, nonspecific, but likely sebaceous cyst (series 27, images 10, 6).   Sinuses/Orbits: Globes and orbital soft tissues within normal limits. Paranasal sinuses are largely clear. Small right mastoid effusion, of doubtful significance.   Other: None.   IMPRESSION: 1. Patchy small volume acute ischemic nonhemorrhagic cortical infarcts involving the subcortical right frontoparietal region. These are likely embolic in nature. 2. Underlying mild chronic microvascular ischemic disease, advanced for age. Small remote right cerebellar infarct. 3. Multiple scattered chronic micro hemorrhages involving the cerebellum and both cerebral hemispheres, nonspecific, but favored to be secondary to poorly controlled hypertension.  CTA head and neck (01/10/22): FINDINGS: CT HEAD FINDINGS   Brain:   No age advanced or lobar predominant parenchymal atrophy.   Known small acute right frontoparietal lobe infarcts were better appreciated on the brain MRI of 01/09/2022.   Background mild chronic small vessel ischemic changes within the cerebral white matter, also better appreciated on yesterday's MRI.   Redemonstrated small chronic infarct within the right cerebellar hemisphere.   Partially empty sella turcica, a nonspecific finding.   There is no acute intracranial hemorrhage.   No extra-axial fluid collection.   No evidence of an intracranial mass.   No midline shift.   Vascular: No hyperdense vessel.   Skull: No fracture or aggressive osseous  lesion.   Sinuses/Orbits: No mass or acute finding within the imaged orbits. No significant paranasal sinus disease.   Other: Small-volume fluid within the right mastoid air cells.   Review of the MIP images confirms the above findings   CTA NECK FINDINGS   Aortic arch: Common origin of the innominate and left common carotid arteries. The visualized aortic arch is normal in caliber. No hemodynamically significant innominate or proximal subclavian artery stenosis.   Right carotid system: CCA and ICA patent within the neck without stenosis or significant atherosclerotic disease.   Left carotid system: CCA and ICA patent within the neck without stenosis or significant atherosclerotic disease. Tortuosity of the cervical ICA.   Vertebral arteries: Vertebral arteries codominant and patent within the neck without stenosis or significant atherosclerotic disease.   Skeleton: No acute fracture or aggressive osseous lesion.   Other neck: No neck mass or cervical lymphadenopathy.   Upper chest: Prior median sternotomy. No consolidation within the imaged lung apices.   Other: Partially imaged nonspecific edema/stranding within the left axilla.   Review of the MIP images confirms the above findings   CTA HEAD FINDINGS   Anterior circulation:   The intracranial internal carotid arteries are patent. The M1 middle cerebral arteries are patent. No M2 proximal branch occlusion or high-grade proximal stenosis. The anterior cerebral arteries are patent. No intracranial aneurysm is identified.   Posterior circulation:   The intracranial vertebral arteries are patent. The basilar artery is patent. The posterior cerebral arteries are patent. Posterior communicating arteries are present bilaterally.   Venous sinuses: Within the limitations of contrast timing, no convincing thrombus.   Anatomic variants: As described.   Other: Ovoid cystic-appearing lesions within the left temporal  and left preauricular soft tissues measuring up to 1.7 cm.   Review of the MIP images confirms the above findings   IMPRESSION: CT head:   1. Known small right frontoparietal lobe acute infarcts were better appreciated on the brain MRI of 01/09/2022. 2. Background mild chronic small vessel image changes within the cerebral white matter. 3. Redemonstrated small chronic infarcts within the right cerebellar hemisphere. 4. Small right mastoid effusion.   CTA neck:  1. The common carotid, internal carotid and vertebral arteries are patent within the neck without stenosis or significant atherosclerotic disease. 2. Partially imaged nonspecific edema/stranding within the left axilla. Correlate clinically and consider dedicated imaging.   CTA head:   1. No intracranial large vessel occlusion or proximal high-grade arterial stenosis. 2. Cystic-appearing lesions within the left temporal and preauricular soft tissues, measuring up to 1.7 cm. Direct visualization recommended.  MRI left humerus w/wo contrast (01/15/22): FINDINGS: Bones/Joint/Cartilage   There are changes of marrow reconversion. There is no evidence of acute fracture. No aggressive osseous lesion. 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 No aggressive osseous lesion.   Muscles and Tendons   There is a heterogeneous intramuscular collection primarily in the short head biceps, measuring approximately 7.2 x 5.7 axially and 18.0 cm in craniocaudal extent (series 17 image 34, series 100 image 24). There is peripheral intrinsic T1 hypertensity, thick along the inferior aspect and thin along the superior aspect. There is associated rim enhancement. There are fluid-fluid levels along the superior aspect (series 11 iage 25-26) and thick blooming artifact along the inferior aspect.   Soft tissues   Extensive soft tissue swelling, intramuscular and superficial along the left upper extremity including the axilla,  supra/infraclavicular upper chest, along the pectoralis musculature, deltoid, and distally beyond the elbow into the proximal forearm. There is mild soft tissue enhancement. Marked skin thickening medially. Mildly enlarged left axillar lymph node measuring 1.1 cm, likely reactive.   IMPRESSION: Large heterogeneous intramuscular collection in the anterior compartment of the left upper extremity, measuring up to 7.2 x 5.7 axially and 18.0 cm in craniocaudal extent. Extensive associated soft tissue edema throughout the left upper extremity extending to the axilla and lower neck, and distally beyond the elbow into the forearm. Marked skin thickening medially. Findings are suggestive of cellulitis and pyomyositis. Intrinsic T1 signal and blooming artifact suggests the presence of a hemorrhagic component, and this collection could also simply be a large hematoma.   No evidence of osteomyelitis or acute fracture.  Echo (06/05/22): 1. Left ventricular ejection fraction, by estimation, is 25 to 30%. The  left ventricle has severely decreased function. The left ventricle  demonstrates global hypokinesis. The left ventricular internal cavity size  was mildly dilated. Left ventricular  diastolic parameters are indeterminate.   2. Right ventricular systolic function is severely reduced. The right  ventricular size is normal. Tricuspid regurgitation signal is inadequate  for assessing PA pressure.   3. Left atrial size was mildly dilated.   4. PHT < 130 ms, elevated TVI ratio elevated in the settiong of low LV  stroke volume (there is no pathologic regurgitation seen by color  Doppler), mean gradient of 6 at a heart rate of 68 bpm reflecting, likely,  a degree of patient prosthesis mismatch..   The mitral valve has been repaired/replaced. No evidence of mitral valve  regurgitation. There is a 31 mm [employer] mechanical valve present in the  mitral position. Procedure Date: 06/10/21.   5. The  aortic valve is tricuspid. Aortic valve regurgitation is mild.   6. The inferior vena cava is normal in size with greater than 50%  respiratory variability, suggesting right atrial pressure of 3 mmHg.  Assessment/Plan:  Donald [name] is a 37 y.o. male who presents for evaluation of left arm weakness, numbness, and tingling. 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 His neurological examination is pertinent for left arm weakness and sensory loss below the elbow. His exam is otherwise normal. While stroke was on the right which could cause left sided symptoms, I do not think this is the cause of patient's left arm symptoms. 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 I will get an EMG to sort out central vs peripheral origin and if peripheral will proceed with ultrasound to ensure there is no active compression or other lesions preventing healing.  PLAN: -EMG of LUE -NMUS of LUE -Gabapentin 300 mg at bedtime -Lidocaine cream PRN -Continue Warfarin. Aspirin is not needed from a stroke perspective, but would defer to cardiology on this. -Continue home PT exercises  -Return to clinic in 3 months  The impression above as well as the plan as outlined below were extensively discussed with the patient who voiced understanding. All questions were answered to their satisfaction.  When available, results of the above investigations and possible further recommendations will be communicated to the patient via telephone/MyChart. Patient to call office if not contacted after expected testing turnaround time.   Total time spent reviewing records, interview, history/exam, documentation, and coordination of care on day of encounter:  50 min   Thank you for allowing  me to participate in patient's care.  If I can answer any additional questions, I would be pleased to do so.  Jacquelyne Balint, MD   CC: Bucio, Julian Reil, FNP [address] [place] [address]  CC: Referring provider: No referring provider defined for this encounter.

## 2022-11-25 ENCOUNTER — Ambulatory Visit: Payer: Medicaid Other | Attending: Internal Medicine | Admitting: *Deleted

## 2022-11-25 DIAGNOSIS — Z5181 Encounter for therapeutic drug level monitoring: Secondary | ICD-10-CM

## 2022-11-25 DIAGNOSIS — Z952 Presence of prosthetic heart valve: Secondary | ICD-10-CM | POA: Diagnosis not present

## 2022-11-25 LAB — POCT INR: INR: 1.4 — AB (ref 2.0–3.0)

## 2022-11-25 NOTE — Patient Instructions (Signed)
Take warfarin 2 tablets tonight then increase dose to 1 tablet daily except 1 1/2 tablets on Sundays and Wednesdays. Recheck in 1 week Started minocycline 100mg  twice daily 07/23/22.  Can increase INR

## 2022-11-27 ENCOUNTER — Ambulatory Visit: Payer: Medicaid Other | Admitting: Neurology

## 2022-11-27 ENCOUNTER — Other Ambulatory Visit (HOSPITAL_COMMUNITY): Payer: Self-pay | Admitting: Internal Medicine

## 2022-11-27 ENCOUNTER — Encounter: Payer: Self-pay | Admitting: Neurology

## 2022-11-27 VITALS — BP 152/93 | HR 66 | Ht 69.0 in | Wt 220.0 lb

## 2022-11-27 DIAGNOSIS — R29898 Other symptoms and signs involving the musculoskeletal system: Secondary | ICD-10-CM

## 2022-11-27 DIAGNOSIS — I63411 Cerebral infarction due to embolism of right middle cerebral artery: Secondary | ICD-10-CM

## 2022-11-27 DIAGNOSIS — M79602 Pain in left arm: Secondary | ICD-10-CM

## 2022-11-27 DIAGNOSIS — T826XXA Infection and inflammatory reaction due to cardiac valve prosthesis, initial encounter: Secondary | ICD-10-CM

## 2022-11-27 DIAGNOSIS — I38 Endocarditis, valve unspecified: Secondary | ICD-10-CM | POA: Diagnosis not present

## 2022-11-27 DIAGNOSIS — S40022D Contusion of left upper arm, subsequent encounter: Secondary | ICD-10-CM | POA: Diagnosis not present

## 2022-11-27 DIAGNOSIS — R202 Paresthesia of skin: Secondary | ICD-10-CM | POA: Diagnosis not present

## 2022-11-27 DIAGNOSIS — R200 Anesthesia of skin: Secondary | ICD-10-CM

## 2022-11-27 MED ORDER — GABAPENTIN 300 MG PO CAPS
300.0000 mg | ORAL_CAPSULE | Freq: Every day | ORAL | 11 refills | Status: DC
Start: 2022-11-27 — End: 2023-02-11

## 2022-11-27 NOTE — Patient Instructions (Addendum)
I saw you today for left arm weakness. While you did have a stroke on the right side of your brain from the infection on your heart valve, I do not think this is what caused your left arm weakness. I think the hematoma in the left arm caused the symptoms.  I would like to investigate this further with testing of your muscles and nerves called an EMG (see more information below, but please do not wear lotion that day). If nerve damage is seen, I will also do an ultrasound at that appointment to make sure there is no problems preventing healing.  For the tingling in your left arm, I am prescribing gabapentin 300 mg at bedtime. Let me know if you have problems with the medication or if it is not helping enough, as we can adjust it if needed. I sent this to your pharmacy.  You can also try Lidocaine cream as needed. Apply wear you have pain, tingling, or burning. Wear gloves to prevent your hands being numb. This can be bought over the counter at any drug store or online.  Continue exercises for your left arm given by physical therapy.  Continue warfarin.  I would like to see you back in clinic in 3 months to check on your progress.  Please let me know if you have any questions or concerns in the meantime.   The physicians and staff at [facility] are committed to providing excellent care. You may receive a survey requesting feedback about your experience at our office. We strive to receive "very good" responses to the survey questions. If you feel that your experience would prevent you from giving the office a "very good " response, please contact our office to try to remedy the situation. We may be reached at [phone]. Thank you for taking the time out of your busy day to complete the survey.  Jacquelyne Balint, MD [facility]  ELECTROMYOGRAM AND NERVE CONDUCTION STUDIES (EMG/NCS) INSTRUCTIONS  How to Prepare The neurologist conducting the EMG will need to know if you have certain  medical conditions. Tell the neurologist and other EMG lab personnel if you: Have a pacemaker or any other electrical medical device Take blood-thinning medications Have hemophilia, a blood-clotting disorder that causes prolonged bleeding Bathing Take a shower or bath shortly before your exam in order to remove oils from your skin. Don't apply lotions or creams before the exam.  What to Expect You'll likely be asked to change into a hospital gown for the procedure and lie down on an examination table. The following explanations can help you understand what will happen during the exam.  Electrodes. The neurologist or a technician places surface electrodes at various locations on your skin depending on where you're experiencing symptoms. Or the neurologist may insert needle electrodes at different sites depending on your symptoms.  Sensations. The electrodes will at times transmit a tiny electrical current that you may feel as a twinge or spasm. The needle electrode may cause discomfort or pain that usually ends shortly after the needle is removed. If you are concerned about discomfort or pain, you may want to talk to the neurologist about taking a short break during the exam.  Instructions. During the needle EMG, the neurologist will assess whether there is any spontaneous electrical activity when the muscle is at rest - activity that isn't present in healthy muscle tissue - and the degree of activity when you slightly contract the muscle.  He or she will give you  instructions on resting and contracting a muscle at appropriate times. Depending on what muscles and nerves the neurologist is examining, he or she may ask you to change positions during the exam.  After your EMG You may experience some temporary, minor bruising where the needle electrode was inserted into your muscle. This bruising should fade within several days. If it persists, contact your primary care doctor.

## 2022-12-11 ENCOUNTER — Ambulatory Visit: Payer: Medicaid Other | Attending: Internal Medicine | Admitting: *Deleted

## 2022-12-11 DIAGNOSIS — Z952 Presence of prosthetic heart valve: Secondary | ICD-10-CM | POA: Diagnosis not present

## 2022-12-11 DIAGNOSIS — Z5181 Encounter for therapeutic drug level monitoring: Secondary | ICD-10-CM

## 2022-12-11 LAB — POCT INR: INR: 1.6 — AB (ref 2.0–3.0)

## 2022-12-11 NOTE — Patient Instructions (Signed)
Take warfarin 2 tablets tonight then increase dose to 1 1/2 tablets daily except 1 tablet on Sundays and Wednesdays. Recheck in 2 week Started minocycline 100mg  twice daily 07/23/22.  Can increase INR

## 2022-12-15 ENCOUNTER — Encounter: Payer: Medicaid Other | Admitting: Neurology

## 2022-12-15 ENCOUNTER — Other Ambulatory Visit: Payer: Medicaid Other | Admitting: Neurology

## 2022-12-23 ENCOUNTER — Ambulatory Visit: Payer: Medicaid Other | Admitting: Neurology

## 2022-12-23 ENCOUNTER — Telehealth: Payer: Self-pay | Admitting: Neurology

## 2022-12-23 ENCOUNTER — Ambulatory Visit (INDEPENDENT_AMBULATORY_CARE_PROVIDER_SITE_OTHER): Payer: Medicaid Other | Admitting: Neurology

## 2022-12-23 DIAGNOSIS — S40022D Contusion of left upper arm, subsequent encounter: Secondary | ICD-10-CM

## 2022-12-23 DIAGNOSIS — M79602 Pain in left arm: Secondary | ICD-10-CM

## 2022-12-23 DIAGNOSIS — R29898 Other symptoms and signs involving the musculoskeletal system: Secondary | ICD-10-CM

## 2022-12-23 DIAGNOSIS — R200 Anesthesia of skin: Secondary | ICD-10-CM

## 2022-12-23 DIAGNOSIS — I63411 Cerebral infarction due to embolism of right middle cerebral artery: Secondary | ICD-10-CM

## 2022-12-23 DIAGNOSIS — I38 Endocarditis, valve unspecified: Secondary | ICD-10-CM

## 2022-12-23 DIAGNOSIS — G128 Other spinal muscular atrophies and related syndromes: Secondary | ICD-10-CM

## 2022-12-23 NOTE — Procedures (Signed)
  [facility]  [address] [place], [address]  [place], [place] [address] Tel: [phone] Fax: [phone] Test Date:  12/23/2022  Patient: Donald [name] DOB: 30-May-1986 Physician: Jacquelyne Balint, MD  Sex: Male Height: 5\' 9"  Ref Phys: Jacquelyne Balint, MD  ID#: [id number]   Technician:    History: This is a 37 year old male with left arm weakness and atrophy.  Findings: High frequency (4.0-16.0 MHz) B-mode, nonvascular ultrasound of the left upper limb shows: Cross sectional areas (CSA) of the left median (palm to mid upper arm) and left ulnar (wrist to mid upper arm) nerves are within normal limits. Wrist to forearm ratios of bilateral median nerves is within normal limits. There is no subluxation or dislocation of either ulnar nerve from the ulnar groove with maximal elbow flexion. Hyper-echogenicity of most muscles of the left upper limb.   Impression: This neuromuscular ultrasound does not show any definitive abnormalities of the left median or ulnar nerves. However, there this significant hyper-echogenicity of most muscles of the arm (distal more so than proximal) that is consistent with denervation changes. In addition, both the median and ulnar nerves' paths crosses through area of old hematoma as designated by scar in left upper arm. There is no obvious compression, though both nerves are lost to ultrasound in this area.   ____________  Jacquelyne Balint, MD [facility]  Nerve Measurements   Site Area Segment Area Ratio Mobility Vascularity Comment   mm Norm   Norm     Left Median  Palm 3.9         Wrist 4.3  < 13.0         Forearm 5.9  < 10.7  Wrist - Forearm 1.37  < 1.50      Pronator teres 4.2  < 11.0         Mid-arm 7.5  < 13.1         Left Ulnar  Wrist 3.3  < 10.0         Forearm 5.0  < 10.0  Elbow - Forearm 1.04      Distal elbow 5.2  < 10.0  Elbow - mid arm 1.02      Elbow 5.2  < 10.0         Proximal elbow 5.4  < 10.0         Mid-arm 5.1  < 10.0          Axilla 5.9  < 10.0          Ultrasound Images:

## 2022-12-23 NOTE — Procedures (Signed)
[facility]  [address] [place], [address]  [place], [place] [address] Tel: [phone] Fax: [phone] Test Date:  12/23/2022  Patient: Donald [name] DOB: Oct 29, 1985 Physician: Jacquelyne Balint, MD  Sex: Male Height: 5\' 9"  Ref Phys: Jacquelyne Balint, MD  ID#: [id number]   Technician:    History: This is a 37 year old male with left arm weakness and atrophy.  NCV & EMG Findings: Extensive electrodiagnostic evaluation of the left upper limb shows: All sensory responses (median, ulnar, radial, medial antebrachial cutaneous (MAC), and lateral antebrachial cutaneous (LAC)) of the left upper limb are absent. Left median (APB) and ulnar (ADM) motor responses are absent. No motor units with active denervation changes are seen on needle examination of the left first dorsal interosseous and pronator teres muscles. Chronic motor axon loss changes without active denervation changes are seen in the left extensor indicis proprius, brachioradialis, flexor digitorum profundus to digits 4,5, and triceps muscles.  Impression: This is a complex, abnormal study. The findings are most consistent with the following: Evidence of left median, ulnar, and radial mononeuropathies, axon loss in type, severe in degree electrically. 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 While the absent MAC and LAC could be evidence of a diffuse left brachial plexopathy, the normal biceps and deltoid makes #1 above more likely. No electrodiagnostic evidence of a left cervical (C5-C8) motor radiculopathy.    ___________________________ Jacquelyne Balint, MD    Nerve Conduction Studies Motor Nerve Results    Latency Amplitude F-Lat Segment Distance CV Comment  Site (ms) Norm (mV) Norm (ms)  (cm) (m/s) Norm   Left Median (APB) Motor  Wrist *NR  < 3.9 *NR  > 6.0        Elbow *NR - *NR -  Elbow-Wrist - *NR  > 50   Left Ulnar (ADM) Motor  Wrist *NR  < 3.1 *NR  >  7.0        Bel elbow *NR - *NR -  Bel elbow-Wrist - *NR  > 50   Ab elbow *NR - *NR -  Ab elbow-Bel elbow - *NR -    Sensory Sites    Neg Peak Lat Amplitude (O-P) Segment Distance Velocity Comment  Site (ms) Norm (V) Norm  (cm) (ms)   Left Lateral Antebrachial Cutaneous Sensory  Lat biceps-Lat forearm *NR  < 2.9 *NR  > 14 Lat biceps-Lat forearm 12    Left Medial Antebrachial Cutaneous Sensory  Elbow-Med forearm *NR  < 3.2 *NR  > 5 Elbow-Med forearm 12    Left Median Sensory  Wrist-Dig II *NR  < 3.4 *NR  > 20 Wrist-Dig II 13    Left Radial Sensory  Forearm-Wrist *NR  < 2.7 *NR  > 18 Forearm-Wrist 10    Left Ulnar Sensory  Wrist *NR  < 3.1 *NR  > 12 Wrist-Dig V 11     Electromyography   Side Muscle Ins.Act Fibs Fasc Recrt Amp Dur Poly Activation Comment  Left FDI Nml *1+ Nml *None *- *- *- *None *ATR  Left EIP Nml Nml Nml *3- Nml *1+ *1+ *2- *ATR  Left Pronator teres Nml *2+ Nml *None *- *- *- *None *ATR  Left Brachiorad Nml Nml Nml *1- *1+ *1+ *1+ Nml N/A  Left Biceps Nml Nml Nml Nml Nml Nml Nml Nml N/A  Left Triceps Nml Nml Nml *1- Nml *1+ *1+ Nml N/A  Left FDP Nml Nml Nml *SMU *1+ *2+ *2+ Nml *MMAV  Left Deltoid Nml Nml Nml Nml Nml Nml Nml Nml N/A  Left C7 PSP Nml Nml Nml Nml Nml Nml Nml Nml N/A      Waveforms:  Motor      Sensory

## 2022-12-23 NOTE — Telephone Encounter (Signed)
Discussed the results of patient's EMG and NMUS after the procedures today. 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 The etiology of left arm weakness is peripheral as opposed to central (due to stroke).  His NMUS showed normal appearing left median and ulnar nerves until the upper arm where both travel through the area of hematoma and scar on arm from surgery where they are lost. There was no clear compression that was able to be seen.   I explained the peripheral origin of patient's symptoms and that the nerve injuries appear quite severe. I explained that treatment would include continuing home exercises as there has been very mild improvement. I warned that there is likely to be significant permanent deficits though.  Patient will follow up with me as planned on 02/27/23. I will consider further imaging and neurosurgery consult if warranted if there is no improvement.  All questions were answered.  Jacquelyne Balint, MD [facility]

## 2022-12-25 ENCOUNTER — Ambulatory Visit: Payer: Medicaid Other | Attending: Internal Medicine | Admitting: *Deleted

## 2022-12-25 DIAGNOSIS — Z5181 Encounter for therapeutic drug level monitoring: Secondary | ICD-10-CM | POA: Diagnosis not present

## 2022-12-25 DIAGNOSIS — Z952 Presence of prosthetic heart valve: Secondary | ICD-10-CM

## 2022-12-25 LAB — POCT INR: INR: 2.7 (ref 2.0–3.0)

## 2022-12-25 NOTE — Patient Instructions (Signed)
Continue warfarin 1 1/2 tablets daily except 1 tablet on Sundays and Wednesdays. Recheck in 3 weeks Started minocycline 100mg  twice daily 07/23/22.  Can increase INR

## 2022-12-31 ENCOUNTER — Other Ambulatory Visit (HOSPITAL_COMMUNITY): Payer: Self-pay

## 2023-01-05 ENCOUNTER — Other Ambulatory Visit (HOSPITAL_COMMUNITY): Payer: Self-pay

## 2023-01-15 ENCOUNTER — Ambulatory Visit: Payer: Medicaid Other | Attending: Internal Medicine

## 2023-01-16 ENCOUNTER — Encounter: Payer: Self-pay | Admitting: *Deleted

## 2023-01-26 ENCOUNTER — Ambulatory Visit: Payer: Medicaid Other | Attending: Internal Medicine | Admitting: *Deleted

## 2023-01-26 DIAGNOSIS — Z5181 Encounter for therapeutic drug level monitoring: Secondary | ICD-10-CM

## 2023-01-26 DIAGNOSIS — Z952 Presence of prosthetic heart valve: Secondary | ICD-10-CM

## 2023-01-26 LAB — POCT INR: INR: 3.9 — AB (ref 2.0–3.0)

## 2023-01-26 NOTE — Patient Instructions (Signed)
Hold warfarin tonight then resume 1 1/2 tablets daily except 1 tablet on Sundays and Wednesdays. Recheck in 3 weeks Started minocycline 100mg  twice daily 07/23/22.  Can increase INR

## 2023-02-10 ENCOUNTER — Encounter: Payer: Self-pay | Admitting: Neurology

## 2023-02-11 ENCOUNTER — Other Ambulatory Visit: Payer: Self-pay | Admitting: Neurology

## 2023-02-11 DIAGNOSIS — R200 Anesthesia of skin: Secondary | ICD-10-CM

## 2023-02-11 DIAGNOSIS — M79602 Pain in left arm: Secondary | ICD-10-CM

## 2023-02-11 MED ORDER — GABAPENTIN 300 MG PO CAPS
300.0000 mg | ORAL_CAPSULE | Freq: Two times a day (BID) | ORAL | 11 refills | Status: DC
Start: 2023-02-11 — End: 2023-02-27

## 2023-02-18 ENCOUNTER — Ambulatory Visit: Payer: Medicaid Other | Attending: Internal Medicine | Admitting: *Deleted

## 2023-02-18 DIAGNOSIS — Z952 Presence of prosthetic heart valve: Secondary | ICD-10-CM

## 2023-02-18 DIAGNOSIS — Z5181 Encounter for therapeutic drug level monitoring: Secondary | ICD-10-CM

## 2023-02-18 LAB — POCT INR: INR: 6.3 — AB (ref 2.0–3.0)

## 2023-02-18 NOTE — Patient Instructions (Addendum)
Hold warfarin tonight and tomorrow night, take 1/2 tablet on Friday then decrease dose to 1 1/2 tablets daily except 1 tablet on Mondays, Wednesdays and Fridays Recheck in 1 week Started minocycline 100mg  twice daily 07/23/22.  Can increase INR Pt denies S/S of bleeding or excess bruising.  Bleeding and fall precautions discussed with pt and he verbalized understanding.

## 2023-02-19 NOTE — Progress Notes (Signed)
I saw Donald [name] in neurology clinic on 02/27/23 in follow up for right hemispheric strokes and left arm weakness.  HPI: Donald [name] is a 37 y.o. 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  To briefly review: Patient was admitted from 12/27/21 to 01/17/22 for vomiting and diarrhea. He developed shock and was transferred to ICU. He was diagnosed with endocarditis. He then developed left arm weakness from the elbow down to hand on the day he was to be discharged. He had numbness and tingling. He describes a gradual weakness over a couple of days. He also describes shooting pains into his arm.   MRI brain on 01/09/22 showed acute right MCA territory ischemic infarcts, thought to be cardioembolic in etiology. Patient denies any other neurologic symptoms in other extremities, face droop, vision changes, speech problems.   He had an MRI of left humerus on 01/15/22 that showed "large heterogeneous intramuscular collection in the anterior compartment of the left upper extremity, measuring up to 7.2 x 5.7 axially and 18.0 cm in craniocaudal extent...suggestive of cellulitis and pyomyositis." This has since resolved after surgery per patient (surgery was 01/2022).   Patient has done PT, last the beginning of 2024. He has seen some improvement but still weak. He has pins like sensation and numbness in the arm. He only gets occasional shooting pains. The pain is worse when he touches the arm and at night. He is not on any nerve pain medications.   Patient is on warfarin and reports taking it daily. He is also on aspirin 81 mg daily per cardiology.   EtOH use: none Restriction diet: No  Most recent Assessment and Plan (11/27/22): Donald [name] is a 37 y.o. male who presents for evaluation of  left arm weakness, numbness, and tingling. 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 His neurological examination is pertinent for left arm weakness and sensory loss below the elbow. His exam is otherwise normal. While stroke was on the right which could cause left sided symptoms, I do not think this is the cause of patient's left arm symptoms. 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 I will get an EMG to sort out central vs peripheral origin and if peripheral will proceed with ultrasound to ensure there is no active compression or other lesions preventing healing.   PLAN: -EMG of LUE -NMUS of LUE -Gabapentin 300 mg at bedtime -Lidocaine cream PRN -Continue Warfarin. Aspirin is not needed from a stroke perspective, but would defer to cardiology on this. -Continue home PT exercises  Since their last visit: Patient had EMG and NMUS on 12/23/22. EMG showed evidence of a left median, ulnar, and radial mononeuropathies perhaps consistent with ischemic monomelic neuropathy. A brachial plexopathy was felt to be less likely given abnormalities were all distal to the elbow. NMUS showed no definite abnormalities but the left median and ulnar nerves traverse the area of old hematoma in left upper arm and are lost to ultrasound in this area.  Per my note from 12/23/22:  I explained the peripheral origin of patient's symptoms and that the nerve injuries appear quite severe.  I explained that treatment would include continuing home exercises as there has been very mild improvement. I warned that there is likely to be significant permanent deficits though.   Patient will follow up with me as planned on 02/27/23. I will consider further imaging and neurosurgery consult if  warranted if there is no improvement.  Patient had ongoing pain, so gabapentin was increased to 300 mg BID on 02/11/23. Patient has ongoing left hand swelling that is likely contributing to pain. He finds it difficult to move arm due to the swelling and pain. He was started on antibiotics for due to infection in the area of hematoma as well.   MEDICATIONS:  Outpatient Encounter Medications as of 02/27/2023  Medication Sig   acetaminophen (TYLENOL) 325 MG tablet Take 2 tablets (650 mg total) by mouth every 4 (four) hours as needed for headache or mild pain.   amiodarone (PACERONE) 200 MG tablet Take 1 tablet by mouth once daily   amoxicillin-clavulanate (AUGMENTIN) 875-125 MG tablet Take 1 tablet by mouth 2 (two) times daily.   aspirin 81 MG chewable tablet Chew 1 tablet (81 mg total) by mouth daily.   digoxin (LANOXIN) 0.125 MG tablet Take 1 tablet (0.125 mg total) by mouth daily.   doxycycline (VIBRAMYCIN) 50 MG capsule Take 2 capsules (100 mg total) by mouth 2 (two) times daily for 14 days.   FARXIGA 10 MG TABS tablet Take 1 tablet by mouth once daily   furosemide (LASIX) 40 MG tablet Take 40 mg by mouth daily.   pantoprazole (PROTONIX) 40 MG tablet Take 1 tablet (40 mg total) by mouth daily.   Potassium Chloride (KLOR-CON PO) 20 mEq. Patient takes on Monday Wednesday and Fridays.   Secukinumab, 300 MG Dose, (COSENTYX, 300 MG DOSE,) 150 MG/ML SOSY Inject into the skin.  every 4 weeks   sertraline (ZOLOFT) 50 MG tablet Take 1 tablet (50 mg total) by mouth daily.   sildenafil (VIAGRA) 50 MG tablet Take 1 tablet (50 mg total) by mouth daily as needed for erectile dysfunction.   spironolactone (ALDACTONE) 25 MG tablet Take 0.5 tablets (12.5 mg total) by mouth daily.   warfarin (COUMADIN) 5 MG tablet Take warfarin 2 - 3 tablets daily or as directed by coumadin clinic   [DISCONTINUED] gabapentin (NEURONTIN) 300 MG capsule Take 1 capsule (300 mg total) by mouth 2 (two) times daily.   gabapentin  (NEURONTIN) 300 MG capsule Take 2 capsules (600 mg total) by mouth 2 (two) times daily.   [DISCONTINUED] minocycline (MINOCIN) 100 MG capsule Take 100 mg by mouth 2 (two) times daily.   [DISCONTINUED] potassium chloride SA (KLOR-CON M) 20 MEQ tablet Take 1 tablet (20 mEq total) by mouth daily.   No facility-administered encounter medications on file as of 02/27/2023.    PAST MEDICAL HISTORY: Past Medical History:  Diagnosis Date   Anemia    Autoimmune disorder (HCC)    pyoderma gangrenosum   CHF (congestive heart failure) (HCC)    Chronic systolic heart failure (HCC)    a. EF 35-40% by echo in 07/2018 b. EF at 45% by repeat echo in 04/2020   DVT (deep venous thrombosis) (HCC)    h/o   Dysrhythmia    Mitral regurgitation    a. 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 b. 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   Mitral stenosis    Myocardial infarction (HCC)    Paroxysmal atrial flutter (  HCC)    Pyoderma gangrenosa    Seronegative spondylitis (HCC)    arthritis   Shock, septic and cardiogenic 12/28/2021   Tricuspid regurgitation     PAST SURGICAL HISTORY: Past Surgical History:  Procedure Laterality Date   CARDIAC CATHETERIZATION     HERNIA REPAIR Right 2018   MITRAL VALVE REPAIR  06/07/2018   [facility] Meda Klinefelter   MITRAL VALVE REPLACEMENT N/A 06/23/2021   MULTIPLE EXTRACTIONS WITH ALVEOLOPLASTY N/A 11/08/2020   Procedure: EXTRACTION OF TEETH NUMBER ONE, FIFTHTEEN, SIXTEEN, SEVENTEEN, EIGHTTEEN AND THRTY WITH ALVEOLOPLASTY OF LOWER LEFT QUADRANT.;  Surgeon: Sharman Cheek, DMD;  Location: [facility] OR;  Service: Dentistry;  Laterality: N/A;   RIGHT/LEFT HEART CATH AND CORONARY ANGIOGRAPHY N/A 09/11/2020   Procedure: RIGHT/LEFT HEART CATH AND CORONARY ANGIOGRAPHY;  Surgeon: Dolores Patty, MD;  Location: [facility] INVASIVE CV LAB;  Service: Cardiovascular;  Laterality: N/A;    TEE WITHOUT CARDIOVERSION N/A 05/23/2020   Procedure: TRANSESOPHAGEAL ECHOCARDIOGRAM (TEE) WITH PROPOFOL;  Surgeon: [name], MD;  Location: [facility] ENDO SUITE;  Service: Endoscopy;  Laterality: N/A;    ALLERGIES: No Known Allergies  FAMILY HISTORY: Family History  Problem Relation Age of Onset   Multiple sclerosis Mother    Psoriasis Mother    Depression Father    Diabetes Father    Diabetes Paternal Grandmother     SOCIAL HISTORY: Social History   Tobacco Use   Smoking status: Former    Current packs/day: 0.00    Types: Cigarettes    Quit date: 10/14/2020    Years since quitting: 2.3   Smokeless tobacco: Never  Vaping Use   Vaping status: Never Used  Substance Use Topics   Alcohol use: No   Drug use: No   Social History   Social History Narrative   Left handed       Are you currently employed ? no   What is your current occupation?   Do you live at home alone?no   Who lives with you? son   What type of home do you live in: 1 story or 2 story? one   Caffeine no     Objective:  Vital Signs:  BP 95/82   Pulse 84   Ht 5\' 9"  (1.753 m)   Wt 224 lb (101.6 kg)   SpO2 100%   BMI 33.08 kg/m   General: General appearance: Awake and alert. No distress. Cooperative with exam.  HEENT: Atraumatic. Anicteric. Lungs: Non-labored breathing on room air  Heart: Regular Extremities: Swelling in left hand, very cold.  Neurological: Mental Status: Alert. Speech fluent. No pseudobulbar affect Cranial Nerves: CNII: No RAPD. Visual fields intact. CNIII, IV, VI: PERRL. No nystagmus. EOMI. CN V: Facial sensation intact bilaterally to fine touch.  CN VII: Facial muscles symmetric and strong. No ptosis at rest. CN VIII: Hears finger rub well bilaterally. CN IX: No hypophonia. CN X: Palate elevates symmetrically. CN XI: Full strength shoulder shrug bilaterally. CN XII: Tongue protrusion full and midline. No atrophy or fasciculations. No significant  dysarthria Motor: Tone is normal (guarding in LUE due to pain). Atrophy in LUE below the elbow.  Individual muscle group testing (MRC grade out of 5):  Movement     Neck flexion 5    Neck extension 5     Right Left   Shoulder abduction 5 5- Guarding in all LUE groups due to pain  Shoulder adduction 5 5   Shoulder ext rotation 5 5  Shoulder int rotation 5 5   Elbow flexion 5 5   Elbow extension 5 5   Finger abduction - FDI 5 0   Finger abduction - ADM 5 0   Finger extension 5 1   Finger distal flexion - 2/3 5 3    Finger distal flexion - 4/5 5 3    Thumb flexion - FPL 5 0   Thumb abduction - APB 5 0    Hip flexion 5 5   Hip extension 5 5   Hip adduction 5 5   Hip abduction 5 5   Knee extension 5 5   Knee flexion 5 5   Dorsiflexion 5 5   Plantarflexion 5 5     Reflexes:  Right Left  Bicep 2+ 2+  Tricep 2+ 2+  BrRad 2+ 0  Knee 2+ 2+  Ankle 2+ 2+   Sensation: Pinprick: Intact except: absent in LUE below the elbow Gait: Normal, narrow-based gait.    Lab and Test Review: New results: 02/26/23: CBC significant for Hb 11.9, MCV 77.2, plts 472 BMP significant for Na 131, Cr 1.29 INR 1.9  EMG (12/23/22): NCV & EMG Findings: Extensive electrodiagnostic evaluation of the left upper limb shows: All sensory responses (median, ulnar, radial, medial antebrachial cutaneous (MAC), and lateral antebrachial cutaneous (LAC)) of the left upper limb are absent. Left median (APB) and ulnar (ADM) motor responses are absent. No motor units with active denervation changes are seen on needle examination of the left first dorsal interosseous and pronator teres muscles. Chronic motor axon loss changes without active denervation changes are seen in the left extensor indicis proprius, brachioradialis, flexor digitorum profundus to digits 4,5, and triceps muscles.   Impression: This is a complex, abnormal study. The findings are most consistent with the following: Evidence of left median,  ulnar, and radial mononeuropathies, axon loss in type, severe in degree electrically. 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 While the absent MAC and LAC could be evidence of a diffuse left brachial plexopathy, the normal biceps and deltoid makes #1 above more likely. No electrodiagnostic evidence of a left cervical (C5-C8) motor radiculopathy.  NMUS (12/23/22): Findings: High frequency (4.0-16.0 MHz) B-mode, nonvascular ultrasound of the left upper limb shows: Cross sectional areas (CSA) of the left median (palm to mid upper arm) and left ulnar (wrist to mid upper arm) nerves are within normal limits. Wrist to forearm ratios of left median nerves is within normal limits. There is no subluxation or dislocation of left ulnar nerve from the ulnar groove with maximal elbow flexion. Hyper-echogenicity of most muscles of the left upper limb.    Impression: This neuromuscular ultrasound does not show any definitive abnormalities of the left median or ulnar nerves. However, there this significant hyper-echogenicity of most muscles of the arm (distal more so than proximal) that is consistent with denervation changes. In addition, both the median and ulnar nerves' paths crosses through area of old hematoma as designated by scar in left upper arm. There is no obvious compression, though both nerves are lost to ultrasound in this area.  Previously reviewed results: Internal labs:      Lab Results  Component Value Date    HGBA1C 5.6 01/10/2022         Lab Results  Component Value Date    VITAMINB12 2,046 (H) 07/28/2021         Lab Results  Component Value Date    TSH 3.252 09/11/2022  External labs: Lipid panel (04/10/22): TG 49, Cholesterol 134, LDL 71, HDL 53 (wnl)   Imaging: MRI brain wo contrast (01/09/22): FINDINGS: Brain: Cerebral volume within normal limits. Mild hazy and patchy T2/FLAIR signal intensity seen  involving the periventricular and deep white matter both cerebral hemispheres, nonspecific, but most characteristic of chronic small vessel ischemic disease, advanced for age. Ischemia. Small remote right cerebellar infarct noted.   Patchy small volume foci of restricted diffusion seen involving the cortical subcortical right frontoparietal region (series 15, images 85, 84, 81), consistent with small acute ischemic infarcts. These are likely embolic in nature. No associated hemorrhage or mass effect. No other evidence for acute or subacute ischemia. No acute intracranial hemorrhage. Multiple scattered chronic micro hemorrhages noted involving the cerebellum and both cerebral hemispheres, nonspecific, but favored to be hypertensive in nature.   No mass lesion, midline shift or mass effect. No hydrocephalus or extra-axial fluid collection. Pituitary gland and suprasellar region within normal limits.   Vascular: Major intracranial vascular flow voids are maintained.   Skull and upper cervical spine: Craniocervical junction within normal limits. Bone marrow signal intensity diffusely decreased on T1 weighted sequence, nonspecific, but most commonly related to anemia, smoking or obesity. No focal marrow replacing lesion. Few small cystic lesions measuring up to 1.8 cm seen within the visualized left temporal and pre-auricular region, nonspecific, but likely sebaceous cyst (series 27, images 10, 6).   Sinuses/Orbits: Globes and orbital soft tissues within normal limits. Paranasal sinuses are largely clear. Small right mastoid effusion, of doubtful significance.   Other: None.   IMPRESSION: 1. Patchy small volume acute ischemic nonhemorrhagic cortical infarcts involving the subcortical right frontoparietal region. These are likely embolic in nature. 2. Underlying mild chronic microvascular ischemic disease, advanced for age. Small remote right cerebellar infarct. 3. Multiple  scattered chronic micro hemorrhages involving the cerebellum and both cerebral hemispheres, nonspecific, but favored to be secondary to poorly controlled hypertension.   CTA head and neck (01/10/22): FINDINGS: CT HEAD FINDINGS   Brain:   No age advanced or lobar predominant parenchymal atrophy.   Known small acute right frontoparietal lobe infarcts were better appreciated on the brain MRI of 01/09/2022.   Background mild chronic small vessel ischemic changes within the cerebral white matter, also better appreciated on yesterday's MRI.   Redemonstrated small chronic infarct within the right cerebellar hemisphere.   Partially empty sella turcica, a nonspecific finding.   There is no acute intracranial hemorrhage.   No extra-axial fluid collection.   No evidence of an intracranial mass.   No midline shift.   Vascular: No hyperdense vessel.   Skull: No fracture or aggressive osseous lesion.   Sinuses/Orbits: No mass or acute finding within the imaged orbits. No significant paranasal sinus disease.   Other: Small-volume fluid within the right mastoid air cells.   Review of the MIP images confirms the above findings   CTA NECK FINDINGS   Aortic arch: Common origin of the innominate and left common carotid arteries. The visualized aortic arch is normal in caliber. No hemodynamically significant innominate or proximal subclavian artery stenosis.   Right carotid system: CCA and ICA patent within the neck without stenosis or significant atherosclerotic disease.   Left carotid system: CCA and ICA patent within the neck without stenosis or significant atherosclerotic disease. Tortuosity of the cervical ICA.   Vertebral arteries: Vertebral arteries codominant and patent within the neck without stenosis or significant atherosclerotic disease.   Skeleton: No acute fracture or aggressive osseous lesion.  Other neck: No neck mass or cervical lymphadenopathy.   Upper  chest: Prior median sternotomy. No consolidation within the imaged lung apices.   Other: Partially imaged nonspecific edema/stranding within the left axilla.   Review of the MIP images confirms the above findings   CTA HEAD FINDINGS   Anterior circulation:   The intracranial internal carotid arteries are patent. The M1 middle cerebral arteries are patent. No M2 proximal branch occlusion or high-grade proximal stenosis. The anterior cerebral arteries are patent. No intracranial aneurysm is identified.   Posterior circulation:   The intracranial vertebral arteries are patent. The basilar artery is patent. The posterior cerebral arteries are patent. Posterior communicating arteries are present bilaterally.   Venous sinuses: Within the limitations of contrast timing, no convincing thrombus.   Anatomic variants: As described.   Other: Ovoid cystic-appearing lesions within the left temporal and left preauricular soft tissues measuring up to 1.7 cm.   Review of the MIP images confirms the above findings   IMPRESSION: CT head:   1. Known small right frontoparietal lobe acute infarcts were better appreciated on the brain MRI of 01/09/2022. 2. Background mild chronic small vessel image changes within the cerebral white matter. 3. Redemonstrated small chronic infarcts within the right cerebellar hemisphere. 4. Small right mastoid effusion.   CTA neck:   1. The common carotid, internal carotid and vertebral arteries are patent within the neck without stenosis or significant atherosclerotic disease. 2. Partially imaged nonspecific edema/stranding within the left axilla. Correlate clinically and consider dedicated imaging.   CTA head:   1. No intracranial large vessel occlusion or proximal high-grade arterial stenosis. 2. Cystic-appearing lesions within the left temporal and preauricular soft tissues, measuring up to 1.7 cm. Direct visualization recommended.   MRI left  humerus w/wo contrast (01/15/22): FINDINGS: Bones/Joint/Cartilage   There are changes of marrow reconversion. There is no evidence of acute fracture. No aggressive osseous lesion. 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 No aggressive osseous lesion.   Muscles and Tendons   There is a heterogeneous intramuscular collection primarily in the short head biceps, measuring approximately 7.2 x 5.7 axially and 18.0 cm in craniocaudal extent (series 17 image 34, series 100 image 24). There is peripheral intrinsic T1 hypertensity, thick along the inferior aspect and thin along the superior aspect. There is associated rim enhancement. There are fluid-fluid levels along the superior aspect (series 11 iage 25-26) and thick blooming artifact along the inferior aspect.   Soft tissues   Extensive soft tissue swelling, intramuscular and superficial along the left upper extremity including the axilla, supra/infraclavicular upper chest, along the pectoralis musculature, deltoid, and distally beyond the elbow into the proximal forearm. There is mild soft tissue enhancement. Marked skin thickening medially. Mildly enlarged left axillar lymph node measuring 1.1 cm, likely reactive.   IMPRESSION: Large heterogeneous intramuscular collection in the anterior compartment of the left upper extremity, measuring up to 7.2 x 5.7 axially and 18.0 cm in craniocaudal extent. Extensive associated soft tissue edema throughout the left upper extremity extending to the axilla and lower neck, and distally beyond the elbow into the forearm. Marked skin thickening medially. Findings are suggestive of cellulitis and pyomyositis. Intrinsic T1 signal and blooming artifact suggests the presence of a hemorrhagic component, and this collection could also simply be a large hematoma.   No evidence of osteomyelitis or acute fracture.   Echo (06/05/22): 1. Left ventricular ejection fraction, by estimation, is 25 to  30%. The  left ventricle has severely decreased function.  The left ventricle  demonstrates global hypokinesis. The left ventricular internal cavity size  was mildly dilated. Left ventricular  diastolic parameters are indeterminate.   2. Right ventricular systolic function is severely reduced. The right  ventricular size is normal. Tricuspid regurgitation signal is inadequate  for assessing PA pressure.   3. Left atrial size was mildly dilated.   4. PHT < 130 ms, elevated TVI ratio elevated in the settiong of low LV  stroke volume (there is no pathologic regurgitation seen by color  Doppler), mean gradient of 6 at a heart rate of 68 bpm reflecting, likely,  a degree of patient prosthesis mismatch..   The mitral valve has been repaired/replaced. No evidence of mitral valve  regurgitation. There is a 31 mm [employer] mechanical valve present in the  mitral position. Procedure Date: 06/10/21.   5. The aortic valve is tricuspid. Aortic valve regurgitation is mild.   6. The inferior vena cava is normal in size with greater than 50%  respiratory variability, suggesting right atrial pressure of 3 mmHg.  ASSESSMENT: This is Donald [name], a 37 y.o. 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 Contributing to his pain is the significant swelling, likely from blood pooling.  Plan: -Increase gabapentin to 600 mg BID -Encouraged patient to discuss hand swelling and coldness with PCP - concern for circulation problem -Discussed PT/OT, but given infection, boils, and pain, patient prefers to hold off for now. -Continue Warfarin as prescribed. Patient is not currently taking asa 81 mg per his report. It would not be needed  from a neurologic perspective, but I encouraged patient to speak to cardiology to make sure they knew he wasn't taking it.  Return to clinic in 6 months  Total time spent reviewing records, interview, history/exam, documentation, and coordination of care on day of encounter:  35 min  Jacquelyne Balint, MD

## 2023-02-25 ENCOUNTER — Ambulatory Visit: Payer: Medicaid Other | Attending: Internal Medicine | Admitting: *Deleted

## 2023-02-25 DIAGNOSIS — Z952 Presence of prosthetic heart valve: Secondary | ICD-10-CM

## 2023-02-25 DIAGNOSIS — Z5181 Encounter for therapeutic drug level monitoring: Secondary | ICD-10-CM

## 2023-02-25 LAB — POCT INR: INR: 2.4 (ref 2.0–3.0)

## 2023-02-25 NOTE — Patient Instructions (Signed)
Continue warfarin 1 1/2 tablets daily except 1 tablet on Mondays, Wednesdays and Fridays Recheck in 2 weeks Started minocycline 100mg  twice daily 07/23/22.  Can increase INR

## 2023-02-26 ENCOUNTER — Ambulatory Visit (HOSPITAL_COMMUNITY)
Admission: RE | Admit: 2023-02-26 | Discharge: 2023-02-26 | Disposition: A | Discharge status: Home / Self Care | Payer: Medicaid Other | Source: Ambulatory Visit | Attending: Internal Medicine | Admitting: Internal Medicine

## 2023-02-26 ENCOUNTER — Ambulatory Visit (HOSPITAL_BASED_OUTPATIENT_CLINIC_OR_DEPARTMENT_OTHER)
Admission: RE | Admit: 2023-02-26 | Discharge: 2023-02-26 | Disposition: A | Discharge status: Home / Self Care | Payer: Medicaid Other | Source: Ambulatory Visit | Attending: Internal Medicine | Admitting: Internal Medicine

## 2023-02-26 ENCOUNTER — Telehealth: Payer: Self-pay | Admitting: Pharmacy Technician

## 2023-02-26 ENCOUNTER — Other Ambulatory Visit (HOSPITAL_COMMUNITY): Payer: Self-pay

## 2023-02-26 ENCOUNTER — Encounter (HOSPITAL_COMMUNITY): Payer: Self-pay | Admitting: Internal Medicine

## 2023-02-26 VITALS — BP 110/84 | HR 84 | Wt 224.0 lb

## 2023-02-26 DIAGNOSIS — I340 Nonrheumatic mitral (valve) insufficiency: Secondary | ICD-10-CM | POA: Insufficient documentation

## 2023-02-26 DIAGNOSIS — L732 Hidradenitis suppurativa: Secondary | ICD-10-CM | POA: Insufficient documentation

## 2023-02-26 DIAGNOSIS — Z7982 Long term (current) use of aspirin: Secondary | ICD-10-CM | POA: Insufficient documentation

## 2023-02-26 DIAGNOSIS — I5042 Chronic combined systolic (congestive) and diastolic (congestive) heart failure: Secondary | ICD-10-CM | POA: Diagnosis not present

## 2023-02-26 DIAGNOSIS — Z9889 Other specified postprocedural states: Secondary | ICD-10-CM

## 2023-02-26 DIAGNOSIS — F32A Depression, unspecified: Secondary | ICD-10-CM | POA: Diagnosis not present

## 2023-02-26 DIAGNOSIS — Z952 Presence of prosthetic heart valve: Secondary | ICD-10-CM | POA: Diagnosis not present

## 2023-02-26 DIAGNOSIS — I5022 Chronic systolic (congestive) heart failure: Secondary | ICD-10-CM

## 2023-02-26 DIAGNOSIS — I480 Paroxysmal atrial fibrillation: Secondary | ICD-10-CM | POA: Diagnosis not present

## 2023-02-26 DIAGNOSIS — I110 Hypertensive heart disease with heart failure: Secondary | ICD-10-CM | POA: Diagnosis not present

## 2023-02-26 DIAGNOSIS — I5082 Biventricular heart failure: Secondary | ICD-10-CM | POA: Diagnosis not present

## 2023-02-26 DIAGNOSIS — I5023 Acute on chronic systolic (congestive) heart failure: Secondary | ICD-10-CM

## 2023-02-26 DIAGNOSIS — Z7901 Long term (current) use of anticoagulants: Secondary | ICD-10-CM | POA: Diagnosis not present

## 2023-02-26 DIAGNOSIS — L88 Pyoderma gangrenosum: Secondary | ICD-10-CM | POA: Diagnosis not present

## 2023-02-26 DIAGNOSIS — Z79899 Other long term (current) drug therapy: Secondary | ICD-10-CM | POA: Insufficient documentation

## 2023-02-26 DIAGNOSIS — I77819 Aortic ectasia, unspecified site: Secondary | ICD-10-CM | POA: Diagnosis not present

## 2023-02-26 DIAGNOSIS — I5040 Unspecified combined systolic (congestive) and diastolic (congestive) heart failure: Secondary | ICD-10-CM | POA: Diagnosis present

## 2023-02-26 DIAGNOSIS — Z8673 Personal history of transient ischemic attack (TIA), and cerebral infarction without residual deficits: Secondary | ICD-10-CM | POA: Insufficient documentation

## 2023-02-26 DIAGNOSIS — S40022D Contusion of left upper arm, subsequent encounter: Secondary | ICD-10-CM

## 2023-02-26 LAB — PROTIME-INR
INR: 1.9 — ABNORMAL HIGH (ref 0.8–1.2)
Prothrombin Time: 21.6 s — ABNORMAL HIGH (ref 11.4–15.2)

## 2023-02-26 LAB — BASIC METABOLIC PANEL
Anion gap: 7 (ref 5–15)
BUN: 18 mg/dL (ref 6–20)
CO2: 28 mmol/L (ref 22–32)
Calcium: 8.5 mg/dL — ABNORMAL LOW (ref 8.9–10.3)
Chloride: 96 mmol/L — ABNORMAL LOW (ref 98–111)
Creatinine, Ser: 1.29 mg/dL — ABNORMAL HIGH (ref 0.61–1.24)
GFR, Estimated: >60 mL/min (ref >60)
Glucose, Bld: 104 mg/dL — ABNORMAL HIGH (ref 70–99)
Potassium: 3.9 mmol/L (ref 3.5–5.1)
Sodium: 131 mmol/L — ABNORMAL LOW (ref 135–145)

## 2023-02-26 LAB — CBC
HCT: 36.6 % — ABNORMAL LOW (ref 39.0–52.0)
Hemoglobin: 11.9 g/dL — ABNORMAL LOW (ref 13.0–17.0)
MCH: 25.1 pg — ABNORMAL LOW (ref 26.0–34.0)
MCHC: 32.5 g/dL (ref 30.0–36.0)
MCV: 77.2 fL — ABNORMAL LOW (ref 80.0–100.0)
Platelets: 472 10*3/uL — ABNORMAL HIGH (ref 150–400)
RBC: 4.74 MIL/uL (ref 4.22–5.81)
RDW: 16.6 % — ABNORMAL HIGH (ref 11.5–15.5)
WBC: 9.8 10*3/uL (ref 4.0–10.5)
nRBC: 0 % (ref 0.0–0.2)

## 2023-02-26 LAB — ECHOCARDIOGRAM COMPLETE
Area-P 1/2: 4.49 cm2
Calc EF: 30.8 %
MV VTI: 3.1 cm2
S' Lateral: 5 cm
Single Plane A2C EF: 26.4 %
Single Plane A4C EF: 32.7 %

## 2023-02-26 MED ORDER — AMOXICILLIN-POT CLAVULANATE 875-125 MG PO TABS: 1.0000 | ORAL_TABLET | Freq: Two times a day (BID) | ORAL | 0 refills | Status: DC

## 2023-02-26 MED ORDER — DOXYCYCLINE HYCLATE 50 MG PO CAPS: 100.0000 mg | ORAL_CAPSULE | Freq: Two times a day (BID) | ORAL | 0 refills | Status: AC

## 2023-02-26 MED ORDER — DOXYCYCLINE HYCLATE 100 MG PO TBEC: 100.0000 mg | DELAYED_RELEASE_TABLET | Freq: Two times a day (BID) | ORAL | 0 refills | Status: DC

## 2023-02-26 NOTE — Patient Instructions (Addendum)
Medication Changes:  START: AUGMENTIN 875MG  TWICE DAILY FOR 2 WEEKS   START: DOXYCYCLINE 100MG  TWICE DAILY FOR 2 WEEKS   IF YOUR ARM IS NOT GETTING BETTER WITHIN 2 DAYS (48HOURS) PLEASE GO TO EMERGENCY ROOM   Lab Work:  Labs done today, your results will be available in MyChart, we will contact you for abnormal readings.  Referrals:  YOU HAVE BEEN REFERRED TO [facility] THEY WILL REACH OUT TO YOU OR CALL TO ARRANGE THIS. PLEASE CALL us WITH ANY CONCERNS   Follow-Up in: 4 MONTHS WITH DR. Gala Romney PLEASE CALL OUR OFFICE AROUND NOVEMBER TO GET SCHEDULED FOR YOUR APPOINTMENT. PHONE NUMBER IS [phone] OPTION 2   At the [facility], you and your health needs are our priority. We have a designated team specialized in the treatment of Heart Failure. This Care Team includes your primary Heart Failure Specialized Cardiologist (physician), Advanced Practice Providers (APPs- Physician Assistants and Nurse Practitioners), and Pharmacist who all work together to provide you with the care you need, when you need it.   You may see any of the following providers on your designated Care Team at your next follow up:  Dr. Arvilla Meres Dr. Marca Ancona Dr. Marcos Eke, NP Robbie Lis, [place] [facility] [place], [place] Brynda Peon, NP Karle Plumber, PharmD   Please be sure to bring in all your medications bottles to every appointment.   Need to Contact us:  If you have any questions or concerns before your next appointment please send [place] a message through [place] or call our office at [phone].    TO LEAVE A MESSAGE FOR THE NURSE SELECT OPTION 2, PLEASE LEAVE A MESSAGE INCLUDING: YOUR NAME DATE OF BIRTH CALL BACK NUMBER REASON FOR CALL**this is important as we prioritize the call backs  YOU WILL RECEIVE A CALL BACK THE SAME DAY AS LONG AS YOU CALL BEFORE 4:00 PM

## 2023-02-26 NOTE — Progress Notes (Signed)
[facility] Note   PCP: Bucio, Julian Reil, FNP Primary Cardiologist: Dina Rich, MD  AHFC: Dr. Gala Romney   HPI: Donald [name] is a 37 y.o. male with history of mitral regurgitation (s/p MV repair with resection of ruptured anterior papillary muscle and reconstruction of papillary chord and placement of annuloplasty ring in 2019). Had recurrent MR and underwent MVR w/ mechanical valve on 06/10/21 by Dr. Florian Buff at [facility].   Admitted 12/22 for a/c CHF and evidence of low output and AKI. Echo with EF 20-25% with severe MR/mod MS and severe RV dysfunction. PICC place, started on empiric milrinone. AKI resolved with addition of milrinone. He underwent MVR w/ mechanical valve on 12/26 by Dr. Florian Buff at [facility].  Admitted 2/23 with low output HF in setting of AF. Echo showed LVEF <20%, RV severely reduced. Mechanical MV ok, Gradient 3 mmHg. Started on milrinone and amio gtt. Diuresed w/ IV Lasix. Diuresed and extubated. Converted to NSR, amio and milrinone weaned off. GDMT titrated. Discharged home, weight was 198 lb.   Echo 10/11/21: EF 25%. Mild RV dysfunction.  Admitted 7/23 with cardiogenic shock. Echo EF 15%, RV sev HK, MVR thick, mean gradient 5-6. Intubated. TEE with severe biventricular failure with vegetation on mechanical MVR. Had CODE stroke. MRI w/ several small acute right MCA territory ischemic strokes, suggestive of a cardioembolic etiology.  Had upper extremity swelling. 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  Ortho consulted and rec transfer to [facility] for Ortho Onc. While at [facility], UE mass found to be hematoma. Kidney function declined and he was transiently on hemodialysis.   Seen in ED 04/05/22 with palpitations. Found to be in SVT vs AFL, and hypotensive. Underwent emergent DCCV to NSR, BP improved.  Echo 12/23 EF 25-30% RV sev HK MVR ok mean gradient 6 (? Mild PPM)  Today he returns for AHF follow up. Feels ok. Denies CP or SOB. Only complaint today is  pain and drainage in left axilla from Hidradrenitis. No fevers/chills.   Echo today: EF 25-30% MVR stable Personally reviewed   Cardiac Studies: - Echo (12/23) EF 25-30%, RV severely reduced - TEE (7/23): LVEF < 20%, RV severely reduced, mean mitral valve gradient 8 mmHg with trivial MR + vegetation on MV - Echo (7/23): EF < 15%, RV severely dow - Echo (4/23): EF 25%, mild RV dysfunction - Echo (2/23): EF < 20%, severe LV dysfunction with global HK, grade II DD, RV severely reduced, S/p mitral valve repair. MV mean gradient 3 mmHg  - Echo (12/22): EF 20-25%, severe LV dysfunction with global HK, mild LVH, RV  moderately reduced, elevated MV gradient , mild AI - R/LHC (09/11/20):  RA = 7 RV = 49/10 PA = 48/16 (32) PCW = 17 (v=32) Fick cardiac output/index = 5.3/2.5 PVR = 2.9 WU Ao sat = 99% PA sat = 71%, 70% High SVC sat =  75% 1. Normal coronary arteries 2. NICM EF 30-35% 3. Severe MR with prominent v-waves in PCWP tracing 4. Mild pulmonary venous HTN with normal CO  Past Medical History:  Diagnosis Date   Anemia    Autoimmune disorder (HCC)    pyoderma gangrenosum   CHF (congestive heart failure) (HCC)    Chronic systolic heart failure (HCC)    a. EF 35-40% by echo in 07/2018 b. EF at 45% by repeat echo in 04/2020   DVT (deep venous thrombosis) (HCC)    h/o   Dysrhythmia    Mitral regurgitation    a.  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 b. 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HCC)    arthritis   Shock, septic and cardiogenic 12/28/2021   Tricuspid regurgitation     Current Outpatient Medications  Medication Sig Dispense Refill   acetaminophen (TYLENOL) 325 MG tablet Take 2 tablets (650 mg total) by mouth every 4 (four) hours as  needed for headache or mild pain.     amiodarone (PACERONE) 200 MG tablet Take 1 tablet by mouth once daily 30 tablet 6   amoxicillin-clavulanate (AUGMENTIN) 875-125 MG tablet Take 1 tablet by mouth 2 (two) times daily. 28 tablet 0   aspirin 81 MG chewable tablet Chew 1 tablet (81 mg total) by mouth daily. 30 tablet 1   digoxin (LANOXIN) 0.125 MG tablet Take 1 tablet (0.125 mg total) by mouth daily. 90 tablet 3   doxycycline (VIBRAMYCIN) 50 MG capsule Take 2 capsules (100 mg total) by mouth 2 (two) times daily for 14 days. 56 capsule 0   FARXIGA 10 MG TABS tablet Take 1 tablet by mouth once daily 30 tablet 5   furosemide (LASIX) 40 MG tablet Take 40 mg by mouth daily.     gabapentin (NEURONTIN) 300 MG capsule Take 1 capsule (300 mg total) by mouth 2 (two) times daily. 60 capsule 11   pantoprazole (PROTONIX) 40 MG tablet Take 1 tablet (40 mg total) by mouth daily. 30 tablet 8   Potassium Chloride (KLOR-CON PO) 20 mEq. Patient takes on Monday Wednesday and Fridays.     Secukinumab, 300 MG Dose, (COSENTYX, 300 MG DOSE,) 150 MG/ML SOSY Inject into the skin.  every 4 weeks     sertraline (ZOLOFT) 50 MG tablet Take 1 tablet (50 mg total) by mouth daily. 60 tablet 4   sildenafil (VIAGRA) 50 MG tablet Take 1 tablet (50 mg total) by mouth daily as needed for erectile dysfunction. 10 tablet 1   spironolactone (ALDACTONE) 25 MG tablet Take 0.5 tablets (12.5 mg total) by mouth daily. 90 tablet 1   warfarin (COUMADIN) 5 MG tablet Take warfarin 2 - 3 tablets daily or as directed by coumadin clinic 90 tablet 5   No current facility-administered medications for this encounter.    No Known Allergies    Social History   Socioeconomic History   Marital status: Single    Spouse name: Not on file   Number of children: Not on file   Years of education: Not on file   Highest education level: Not on file  Occupational History   Not on file  Tobacco Use   Smoking status: Former    Current packs/day: 0.00     Types: Cigarettes    Quit date: 10/14/2020    Years since quitting: 2.3   Smokeless tobacco: Never  Vaping Use   Vaping status: Never Used  Substance and Sexual Activity   Alcohol use: No   Drug use: No   Sexual activity: Yes  Other Topics Concern   Not on file  Social History Narrative   Left handed    Social Determinants of Health   Financial Resource Strain: Low Risk  (03/13/2021)   Received from [facility], [facility]   Overall Financial Resource Strain (CARDIA)    Difficulty of Paying Living  Expenses: Not hard at all  Food Insecurity: No Food Insecurity (03/13/2021)   Received from [facility], [facility]   Hunger Vital Sign    Worried About Running Out of Food in the Last Year: Never true    Ran Out of Food in the Last Year: Never true  Transportation Needs: No Transportation Needs (01/18/2022)   Received from [facility], [facility]   [facility] - Transportation    In the past 12 months, has lack of transportation kept you from medical appointments or from getting medications?: No    Lack of Transportation (Non-Medical): No  Physical Activity: Insufficiently Active (05/18/2020)   Received from [facility], [facility]   Exercise Vital Sign    Days of Exercise per Week: 7 days    Minutes of Exercise per Session: 20 min  Stress: No Stress Concern Present (05/18/2020)   Received from [facility], [facility] of Occupational Health - Occupational Stress Questionnaire    Feeling of Stress : Not at all  Social Connections: Socially Isolated (05/18/2020)   Received from [facility], [facility]   Social Connection and Isolation Panel [NHANES]    Frequency of Communication with Friends and Family: More than three times a week    Frequency of Social Gatherings with Friends and Family: More than three times a week    Attends Religious Services: Never    Database administrator or  Organizations: No    Attends Banker Meetings: Never    Marital Status: Never married  Intimate Partner Violence: Not At Risk (05/18/2020)   Received from [facility], [facility]   Humiliation, Afraid, Rape, and Kick questionnaire    Fear of Current or Ex-Partner: No    Emotionally Abused: No    Physically Abused: No    Sexually Abused: No   Family History  Problem Relation Age of Onset   Multiple sclerosis Mother    Psoriasis Mother    Depression Father    Diabetes Father    Diabetes Paternal Grandmother    BP 110/84   Pulse 84   Wt 101.6 kg (224 lb)   SpO2 99%   BMI 33.08 kg/m   Wt Readings from Last 3 Encounters:  02/26/23 101.6 kg (224 lb)  11/27/22 99.8 kg (220 lb)  10/28/22 97.3 kg (214 lb 9.6 oz)   PHYSICAL EXAM: General:  well appearing.  No respiratory difficulty HEENT: normal Neck: supple. JVD ~8 cm. Carotids 2+ bilat; no bruits. No lymphadenopathy or thyromegaly appreciated. Cor: PMI nondisplaced. Regular rate & rhythm. Mechanical s1 Lungs: clear Abdomen: obese soft, nontender, nondistended. No hepatosplenomegaly. No bruits or masses. Good bowel sounds. Extremities: no cyanosis, clubbing, rash, edema  L axilla swollen and warm. Draining pus Neuro: alert & oriented x 3, cranial nerves grossly intact. moves all 4 extremities w/o difficulty. Affect pleasant.    ASSESSMENT & PLAN:  1. Chronic HFrEF/Biventricular Failure - Echo (4/23): EF of 20-25% and RV function was severely reduced.  - Echo (7/23): EF 10% RV severely HK. Cardiogenic/septic shock, required NE and milrinone. - TEE (7/23) showed severe BiV failure + vegetation on mechanical MVR. - Echo (12/23) EF 25-30%, RV severely reduced - Echo today 02/26/23: EF 25-30% mechanical s1 RV mod to severely down Personally reviewed - Stable NYHA II Volume ok - Continue Lasix 40 mg daily + KDUR daily - Continue Coreg 3.125  mg bid. - Continue Farxiga 10 mg daily. - Continue spiro  12.5mg  daily - Continue digoxin 0.125mg  daily,  - Would attempt to add ARB/ARNI at next visit when infectious issues settled -  2. 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  - Underwent MVR with mechanical mitral valve in 12/22 at [facility].  - TEE 7/23 severe biventricular failure. + vegetation on mechanical MVR. - Completed 6 weeks IV abx. No infectious symptoms - MVR stable on echo today  3. Paroxysmal Atrial Fibrillation - Does not tolerate AF - Continue amiodarone 200 mg daily. - thyroid function labs stable 3/24  4. H/o AKI - Due to ATN/shock - Baseline creatinine 0.98 - 1.0.  - transiently required hemodialysis during last admission. - SCr 1.3-1.4 recently - Continue SGLT2i. - Recheck today  5. Autoimmune Disorder  - He has seronegative spondylitis and pyoderma gangrenosum. - [facility] Derm at [facility] - Continue secukinumab injections   6. 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 Depression - Continue Zoloft 50 mg daily - Follow-up with PCP  8. Hidradenitis suppurtiva - left axilla wound concerning for infection - I d/w ID personally. Given mechanical MVR would treat aggressively.  - Start doxy 200 bid and augmentin 875 bid x 2 weeks - Refer to Dermatology locally - If getting worse go to ER - Will alert Coumadin Clinic to follow INR closely on abx   Total time spent 45 minutes. Over half that time spent discussing above.    Follow up in 4-6 months with Dr. Gala Romney. + echo  Arvilla Meres, MD Advanced [facility] Failure Team  02/26/23

## 2023-02-26 NOTE — Telephone Encounter (Signed)
Pharmacy Patient Advocate Encounter   Received notification from CoverMyMeds that prior authorization for doxycycline is required/requested.   Insurance verification completed.   The patient is insured through [facility] .   Per test claim: PA required; PA submitted to [facility] via CoverMyMeds Key/confirmation #/EOC [id number] Status is pending

## 2023-02-26 NOTE — Progress Notes (Signed)
  Echocardiogram 2D Echocardiogram has been performed.  Donald [name] 02/26/2023, 11:49 AM

## 2023-02-26 NOTE — Telephone Encounter (Signed)
Pharmacy Patient Advocate Encounter  Received notification from [facility] that Prior Authorization for doxycycline has been APPROVED from 02/26/23 to 02/26/24   PA #/Case ID/Reference #: [id number]

## 2023-02-27 ENCOUNTER — Ambulatory Visit: Payer: Medicaid Other | Admitting: Neurology

## 2023-02-27 ENCOUNTER — Encounter: Payer: Self-pay | Admitting: Neurology

## 2023-02-27 VITALS — BP 95/82 | HR 84 | Ht 69.0 in | Wt 224.0 lb

## 2023-02-27 DIAGNOSIS — I63411 Cerebral infarction due to embolism of right middle cerebral artery: Secondary | ICD-10-CM | POA: Diagnosis not present

## 2023-02-27 DIAGNOSIS — G128 Other spinal muscular atrophies and related syndromes: Secondary | ICD-10-CM | POA: Diagnosis not present

## 2023-02-27 DIAGNOSIS — S40022D Contusion of left upper arm, subsequent encounter: Secondary | ICD-10-CM | POA: Diagnosis not present

## 2023-02-27 DIAGNOSIS — M79602 Pain in left arm: Secondary | ICD-10-CM

## 2023-02-27 DIAGNOSIS — R29898 Other symptoms and signs involving the musculoskeletal system: Secondary | ICD-10-CM

## 2023-02-27 DIAGNOSIS — R202 Paresthesia of skin: Secondary | ICD-10-CM

## 2023-02-27 DIAGNOSIS — R200 Anesthesia of skin: Secondary | ICD-10-CM | POA: Diagnosis not present

## 2023-02-27 MED ORDER — GABAPENTIN 300 MG PO CAPS
600.0000 mg | ORAL_CAPSULE | Freq: Two times a day (BID) | ORAL | 11 refills | Status: DC
Start: 2023-02-27 — End: 2023-08-27

## 2023-02-27 NOTE — Patient Instructions (Signed)
I will increase your gabapentin to 600 mg twice daily to help with burning and tingling pain. I sent a new prescription to your pharmacy. Please let me know if you have problems getting the medication.  I also want you to discuss your cold and swollen hand with your primary care doctor to be sure that the circulation in your arm does not need to be evaluated.  We discussed therapy to get strength back in your arm. Given your pain and infection, you want to hold off for now. Let me know if you are ready to do so, and I will order it.  Continue Warfarin as prescribed.  I will see you back in clinic in 6 months or sooner if needed. Please let me know if you have any questions or concerns in the meantime.   The physicians and staff at [facility] are committed to providing excellent care. You may receive a survey requesting feedback about your experience at our office. We strive to receive "very good" responses to the survey questions. If you feel that your experience would prevent you from giving the office a "very good " response, please contact our office to try to remedy the situation. We may be reached at [phone]. Thank you for taking the time out of your busy day to complete the survey.  Jacquelyne Balint, MD [facility]

## 2023-03-09 ENCOUNTER — Ambulatory Visit: Payer: Medicaid Other | Attending: Internal Medicine | Admitting: *Deleted

## 2023-03-09 DIAGNOSIS — Z5181 Encounter for therapeutic drug level monitoring: Secondary | ICD-10-CM

## 2023-03-09 DIAGNOSIS — Z952 Presence of prosthetic heart valve: Secondary | ICD-10-CM | POA: Diagnosis not present

## 2023-03-09 LAB — POCT INR: INR: 2.9 (ref 2.0–3.0)

## 2023-03-09 NOTE — Patient Instructions (Signed)
Continue warfarin 1 1/2 tablets daily except 1 tablet on Mondays, Wednesdays and Fridays On Augmenting and Doxycycline x 1 more week Continue greens till finished Abx Recheck in 1 week Started minocycline 100mg  twice daily 07/23/22.  Can increase INR

## 2023-03-18 ENCOUNTER — Ambulatory Visit: Payer: Medicaid Other | Attending: Internal Medicine | Admitting: *Deleted

## 2023-03-18 DIAGNOSIS — Z5181 Encounter for therapeutic drug level monitoring: Secondary | ICD-10-CM | POA: Diagnosis not present

## 2023-03-18 DIAGNOSIS — Z79899 Other long term (current) drug therapy: Secondary | ICD-10-CM | POA: Diagnosis not present

## 2023-03-18 DIAGNOSIS — Z952 Presence of prosthetic heart valve: Secondary | ICD-10-CM | POA: Diagnosis not present

## 2023-03-18 DIAGNOSIS — R238 Other skin changes: Secondary | ICD-10-CM | POA: Diagnosis not present

## 2023-03-18 DIAGNOSIS — N529 Male erectile dysfunction, unspecified: Secondary | ICD-10-CM | POA: Diagnosis not present

## 2023-03-18 DIAGNOSIS — I509 Heart failure, unspecified: Secondary | ICD-10-CM | POA: Diagnosis not present

## 2023-03-18 DIAGNOSIS — M7989 Other specified soft tissue disorders: Secondary | ICD-10-CM | POA: Diagnosis not present

## 2023-03-18 DIAGNOSIS — I499 Cardiac arrhythmia, unspecified: Secondary | ICD-10-CM | POA: Diagnosis not present

## 2023-03-18 DIAGNOSIS — F418 Other specified anxiety disorders: Secondary | ICD-10-CM | POA: Diagnosis not present

## 2023-03-18 DIAGNOSIS — K219 Gastro-esophageal reflux disease without esophagitis: Secondary | ICD-10-CM | POA: Diagnosis not present

## 2023-03-18 DIAGNOSIS — L732 Hidradenitis suppurativa: Secondary | ICD-10-CM | POA: Diagnosis not present

## 2023-03-18 DIAGNOSIS — R29898 Other symptoms and signs involving the musculoskeletal system: Secondary | ICD-10-CM | POA: Diagnosis not present

## 2023-03-18 DIAGNOSIS — Z0000 Encounter for general adult medical examination without abnormal findings: Secondary | ICD-10-CM | POA: Diagnosis not present

## 2023-03-18 LAB — POCT INR: INR: 5 — AB (ref 2.0–3.0)

## 2023-03-18 MED ORDER — WARFARIN SODIUM 5 MG PO TABS: ORAL_TABLET | ORAL | 5 refills | Status: DC

## 2023-03-18 NOTE — Patient Instructions (Addendum)
Hold warfarin tonight and tomorrow night then continue warfarin 1 1/2 tablets daily except 1 tablet on Mondays, Wednesdays and Fridays Finished Augmenting.  Still taking Doxycycline x 1 more week Continue greens till finished Abx Recheck in 1 week Started minocycline 100mg  twice daily 07/23/22.  Can increase INR No S/S of bleeding.  Bleeding and fall precautions discussed with pt and he verbalized understanding.

## 2023-03-24 ENCOUNTER — Ambulatory Visit: Payer: Medicaid Other

## 2023-04-01 ENCOUNTER — Telehealth (HOSPITAL_COMMUNITY): Payer: Self-pay | Admitting: Cardiology

## 2023-04-01 ENCOUNTER — Inpatient Hospital Stay (HOSPITAL_COMMUNITY)
Admission: EM | Admit: 2023-04-01 | Discharge: 2023-04-03 | DRG: 556 | Disposition: A | Discharge status: Home / Self Care | Payer: Medicaid Other | Source: Ambulatory Visit | Attending: Internal Medicine | Admitting: Internal Medicine

## 2023-04-01 ENCOUNTER — Encounter (HOSPITAL_COMMUNITY): Payer: Self-pay

## 2023-04-01 ENCOUNTER — Ambulatory Visit: Payer: Medicaid Other | Attending: Internal Medicine | Admitting: *Deleted

## 2023-04-01 DIAGNOSIS — Z7901 Long term (current) use of anticoagulants: Secondary | ICD-10-CM

## 2023-04-01 DIAGNOSIS — L88 Pyoderma gangrenosum: Secondary | ICD-10-CM | POA: Diagnosis present

## 2023-04-01 DIAGNOSIS — L732 Hidradenitis suppurativa: Secondary | ICD-10-CM | POA: Diagnosis present

## 2023-04-01 DIAGNOSIS — Z5181 Encounter for therapeutic drug level monitoring: Secondary | ICD-10-CM | POA: Diagnosis not present

## 2023-04-01 DIAGNOSIS — M7989 Other specified soft tissue disorders: Secondary | ICD-10-CM | POA: Diagnosis not present

## 2023-04-01 DIAGNOSIS — G8929 Other chronic pain: Secondary | ICD-10-CM | POA: Diagnosis present

## 2023-04-01 DIAGNOSIS — I4892 Unspecified atrial flutter: Secondary | ICD-10-CM | POA: Diagnosis present

## 2023-04-01 DIAGNOSIS — D509 Iron deficiency anemia, unspecified: Secondary | ICD-10-CM | POA: Diagnosis not present

## 2023-04-01 DIAGNOSIS — Z5321 Procedure and treatment not carried out due to patient leaving prior to being seen by health care provider: Secondary | ICD-10-CM | POA: Diagnosis not present

## 2023-04-01 DIAGNOSIS — Z7982 Long term (current) use of aspirin: Secondary | ICD-10-CM

## 2023-04-01 DIAGNOSIS — I081 Rheumatic disorders of both mitral and tricuspid valves: Secondary | ICD-10-CM | POA: Diagnosis present

## 2023-04-01 DIAGNOSIS — Z952 Presence of prosthetic heart valve: Secondary | ICD-10-CM

## 2023-04-01 DIAGNOSIS — I82622 Acute embolism and thrombosis of deep veins of left upper extremity: Principal | ICD-10-CM | POA: Diagnosis present

## 2023-04-01 DIAGNOSIS — E871 Hypo-osmolality and hyponatremia: Secondary | ICD-10-CM | POA: Diagnosis present

## 2023-04-01 DIAGNOSIS — M79602 Pain in left arm: Secondary | ICD-10-CM | POA: Diagnosis present

## 2023-04-01 DIAGNOSIS — I5022 Chronic systolic (congestive) heart failure: Secondary | ICD-10-CM | POA: Diagnosis present

## 2023-04-01 DIAGNOSIS — Z833 Family history of diabetes mellitus: Secondary | ICD-10-CM

## 2023-04-01 DIAGNOSIS — D649 Anemia, unspecified: Secondary | ICD-10-CM | POA: Diagnosis present

## 2023-04-01 DIAGNOSIS — I110 Hypertensive heart disease with heart failure: Secondary | ICD-10-CM | POA: Diagnosis not present

## 2023-04-01 DIAGNOSIS — R791 Abnormal coagulation profile: Secondary | ICD-10-CM | POA: Diagnosis present

## 2023-04-01 DIAGNOSIS — Z820 Family history of epilepsy and other diseases of the nervous system: Secondary | ICD-10-CM

## 2023-04-01 DIAGNOSIS — Z79899 Other long term (current) drug therapy: Secondary | ICD-10-CM

## 2023-04-01 DIAGNOSIS — I252 Old myocardial infarction: Secondary | ICD-10-CM | POA: Diagnosis not present

## 2023-04-01 DIAGNOSIS — I509 Heart failure, unspecified: Secondary | ICD-10-CM | POA: Diagnosis not present

## 2023-04-01 DIAGNOSIS — Z86718 Personal history of other venous thrombosis and embolism: Secondary | ICD-10-CM

## 2023-04-01 DIAGNOSIS — Z818 Family history of other mental and behavioral disorders: Secondary | ICD-10-CM

## 2023-04-01 DIAGNOSIS — E876 Hypokalemia: Secondary | ICD-10-CM | POA: Diagnosis present

## 2023-04-01 DIAGNOSIS — Z87891 Personal history of nicotine dependence: Secondary | ICD-10-CM

## 2023-04-01 DIAGNOSIS — D75839 Thrombocytosis, unspecified: Secondary | ICD-10-CM | POA: Diagnosis present

## 2023-04-01 DIAGNOSIS — I82B12 Acute embolism and thrombosis of left subclavian vein: Secondary | ICD-10-CM | POA: Diagnosis not present

## 2023-04-01 HISTORY — DX: Hidradenitis suppurativa: L73.2

## 2023-04-01 LAB — COMPREHENSIVE METABOLIC PANEL
ALT: 11 U/L (ref 0–44)
AST: 25 U/L (ref 15–41)
Albumin: 2.1 g/dL — ABNORMAL LOW (ref 3.5–5.0)
Alkaline Phosphatase: 105 U/L (ref 38–126)
Anion gap: 8 (ref 5–15)
BUN: 12 mg/dL (ref 6–20)
CO2: 23 mmol/L (ref 22–32)
Calcium: 8.3 mg/dL — ABNORMAL LOW (ref 8.9–10.3)
Chloride: 102 mmol/L (ref 98–111)
Creatinine, Ser: 1.3 mg/dL — ABNORMAL HIGH (ref 0.61–1.24)
GFR, Estimated: >60 mL/min (ref >60)
Glucose, Bld: 91 mg/dL (ref 70–99)
Potassium: 3.5 mmol/L (ref 3.5–5.1)
Sodium: 133 mmol/L — ABNORMAL LOW (ref 135–145)
Total Bilirubin: 0.3 mg/dL (ref 0.3–1.2)
Total Protein: 9.7 g/dL — ABNORMAL HIGH (ref 6.5–8.1)

## 2023-04-01 LAB — CBC
HCT: 30.9 % — ABNORMAL LOW (ref 39.0–52.0)
Hemoglobin: 9.6 g/dL — ABNORMAL LOW (ref 13.0–17.0)
MCH: 24.4 pg — ABNORMAL LOW (ref 26.0–34.0)
MCHC: 31.1 g/dL (ref 30.0–36.0)
MCV: 78.4 fL — ABNORMAL LOW (ref 80.0–100.0)
Platelets: 429 10*3/uL — ABNORMAL HIGH (ref 150–400)
RBC: 3.94 MIL/uL — ABNORMAL LOW (ref 4.22–5.81)
RDW: 17.4 % — ABNORMAL HIGH (ref 11.5–15.5)
WBC: 9.9 10*3/uL (ref 4.0–10.5)
nRBC: 0 % (ref 0.0–0.2)

## 2023-04-01 LAB — POCT INR: INR: 7 — AB (ref 2.0–3.0)

## 2023-04-01 LAB — PROTIME-INR
INR: 4.5 (ref 0.8–1.2)
Prothrombin Time: 42.9 s — ABNORMAL HIGH (ref 11.4–15.2)

## 2023-04-01 NOTE — ED Provider Notes (Incomplete)
[place] EMERGENCY DEPARTMENT AT [facility] Provider Note   CSN: [id number] Arrival date & time: 04/01/23  1651     History {Add pertinent medical, surgical, social history, OB history to HPI:1} Chief Complaint  Patient presents with  . Arm Pain  . Arm Swelling    Donald [name] is a 37 y.o. male.  The history is provided by the patient.  Arm Pain  He has history of paroxysmal atrial flutter, myocardial infarction, mitral regurgitation status post mitral valve replacement and anticoagulation with warfarin, chronic systolic heart failure   Home Medications Prior to Admission medications   Medication Sig Start Date End Date Taking? Authorizing Provider  acetaminophen (TYLENOL) 325 MG tablet Take 2 tablets (650 mg total) by mouth every 4 (four) hours as needed for headache or mild pain. 06/04/21   Tonye Becket D, NP  amiodarone (PACERONE) 200 MG tablet Take 1 tablet by mouth once daily 10/15/22   Bensimhon, Bevelyn Buckles, MD  amoxicillin-clavulanate (AUGMENTIN) 875-125 MG tablet Take 1 tablet by mouth 2 (two) times daily. 02/26/23   Bensimhon, Bevelyn Buckles, MD  aspirin 81 MG chewable tablet Chew 1 tablet (81 mg total) by mouth daily. 01/10/22   Valetta Close, MD  digoxin (LANOXIN) 0.125 MG tablet Take 1 tablet (0.125 mg total) by mouth daily. 09/11/22   Bensimhon, Bevelyn Buckles, MD  FARXIGA 10 MG TABS tablet Take 1 tablet by mouth once daily 11/27/22   Bensimhon, Bevelyn Buckles, MD  furosemide (LASIX) 40 MG tablet Take 40 mg by mouth daily.    [provider]  gabapentin (NEURONTIN) 300 MG capsule Take 2 capsules (600 mg total) by mouth 2 (two) times daily. 02/27/23   Antony Madura, MD  pantoprazole (PROTONIX) 40 MG tablet Take 1 tablet (40 mg total) by mouth daily. 10/16/22   Alen Bleacher, NP  Potassium Chloride (KLOR-CON PO) 20 mEq. Patient takes on Monday Wednesday and Fridays.    [provider]  Secukinumab, 300 MG Dose, (COSENTYX, 300 MG DOSE,) 150 MG/ML SOSY Inject  into the skin.  every 4 weeks    [provider]  sertraline (ZOLOFT) 50 MG tablet Take 1 tablet (50 mg total) by mouth daily. 09/11/22   Bensimhon, Bevelyn Buckles, MD  sildenafil (VIAGRA) 50 MG tablet Take 1 tablet (50 mg total) by mouth daily as needed for erectile dysfunction. 09/11/22   Bensimhon, Bevelyn Buckles, MD  spironolactone (ALDACTONE) 25 MG tablet Take 0.5 tablets (12.5 mg total) by mouth daily. 09/11/22   Bensimhon, Bevelyn Buckles, MD  warfarin (COUMADIN) 5 MG tablet Take warfarin 1 - 1 1/2 tablets daily or as directed by coumadin clinic 03/18/23   [name], MD      Allergies    Patient has no known allergies.    Review of Systems   Review of Systems  All other systems reviewed and are negative.   Physical Exam Updated Vital Signs BP 98/69 (BP Location: Right Arm)   Pulse 70   Temp 98.3 F (36.8 C)   Resp 18   Ht 5\' 9"  (1.753 m)   Wt 103.9 kg   SpO2 100%   BMI 33.82 kg/m  Physical Exam Vitals and nursing note reviewed.   37 year old male, resting comfortably and in no acute distress. Vital signs are ***. Oxygen saturation is ***%, which is normal. Head is normocephalic and atraumatic. PERRLA, EOMI. Oropharynx is clear. Neck is nontender and supple without adenopathy or JVD. Back is nontender  and there is no CVA tenderness. Lungs are clear without rales, wheezes, or rhonchi. Chest is nontender. Heart has regular rate and rhythm without murmur. Abdomen is soft, flat, nontender without masses or hepatosplenomegaly and peristalsis is normoactive. Extremities have no cyanosis or edema, full range of motion is present. Skin is warm and dry without rash. Neurologic: Mental status is normal, cranial nerves are intact, there are no motor or sensory deficits.  ED Results / Procedures / Treatments   Labs (all labs ordered are listed, but only abnormal results are displayed) Labs Reviewed  CBC - Abnormal; Notable for the following components:      Result Value   RBC  3.94 (*)    Hemoglobin 9.6 (*)    HCT 30.9 (*)    MCV 78.4 (*)    MCH 24.4 (*)    RDW 17.4 (*)    Platelets 429 (*)    All other components within normal limits  COMPREHENSIVE METABOLIC PANEL - Abnormal; Notable for the following components:   Sodium 133 (*)    Creatinine, Ser 1.30 (*)    Calcium 8.3 (*)    Total Protein 9.7 (*)    Albumin 2.1 (*)    All other components within normal limits  PROTIME-INR - Abnormal; Notable for the following components:   Prothrombin Time 42.9 (*)    INR 4.5 (*)    All other components within normal limits    EKG None  Radiology No results found.  Procedures Procedures  {Document cardiac monitor, telemetry assessment procedure when appropriate:1}  Medications Ordered in ED Medications - No data to display  ED Course/ Medical Decision Making/ A&P   {   Click here for ABCD2, HEART and other calculatorsREFRESH Note before signing :1}                              Medical Decision Making Amount and/or Complexity of Data Reviewed Labs: ordered.   ***  {Document critical care time when appropriate:1} {Document review of labs and clinical decision tools ie heart score, Chads2Vasc2 etc:1}  {Document your independent review of radiology images, and any outside records:1} {Document your discussion with family members, caretakers, and with consultants:1} {Document social determinants of health affecting pt's care:1} {Document your decision making why or why not admission, treatments were needed:1} Final Clinical Impression(s) / ED Diagnoses Final diagnoses:  None    Rx / DC Orders ED Discharge Orders     None

## 2023-04-01 NOTE — ED Provider Notes (Signed)
[name] EMERGENC[facility] DEPARTMENT AT [facility] Provider Note   CSN: [id number] Arrival date & time: 04/01/23  1651     History  Chief Complaint  Patient presents with   Arm Pain   Arm Swelling    Donald [name] is a 37 y.o. male.  The history is provided by the patient.  Arm Pain  He has history of paroxysmal atrial flutter, myocardial infarction, mitral regurgitation status post mitral valve replacement and anticoagulation with warfarin, chronic systolic heart failure and comes in because he was found to have a blood clot in his left subclavian vein.  He had a routine follow-up with cardiologist who noted that his left arm was swollen and ordered an ultrasound which showed the clot present.  He states that he has not had any use in that arm for the last year-apparently from nerve damage from a hematoma.  His left arm has been swelling over the last month and getting worse.  He denies chest pain or difficulty breathing.   Home Medications Prior to Admission medications   Medication Sig Start Date End Date Taking? Authorizing Provider  acetaminophen (TYLENOL) 325 MG tablet Take 2 tablets (650 mg total) by mouth every 4 (four) hours as needed for headache or mild pain. 06/04/21   Tonye Becket D, NP  amiodarone (PACERONE) 200 MG tablet Take 1 tablet by mouth once daily 10/15/22   Bensimhon, Bevelyn Buckles, MD  amoxicillin-clavulanate (AUGMENTIN) 875-125 MG tablet Take 1 tablet by mouth 2 (two) times daily. 02/26/23   Bensimhon, Bevelyn Buckles, MD  aspirin 81 MG chewable tablet Chew 1 tablet (81 mg total) by mouth daily. 01/10/22   Valetta Close, MD  digoxin (LANOXIN) 0.125 MG tablet Take 1 tablet (0.125 mg total) by mouth daily. 09/11/22   Bensimhon, Bevelyn Buckles, MD  FARXIGA 10 MG TABS tablet Take 1 tablet by mouth once daily 11/27/22   Bensimhon, Bevelyn Buckles, MD  furosemide (LASIX) 40 MG tablet Take 40 mg by mouth daily.    [provider]  gabapentin (NEURONTIN) 300 MG capsule Take  2 capsules (600 mg total) by mouth 2 (two) times daily. 02/27/23   Antony Madura, MD  pantoprazole (PROTONIX) 40 MG tablet Take 1 tablet (40 mg total) by mouth daily. 10/16/22   Alen Bleacher, NP  Potassium Chloride (KLOR-CON PO) 20 mEq. Patient takes on Monday Wednesday and Fridays.    [provider]  Secukinumab, 300 MG Dose, (COSENTYX, 300 MG DOSE,) 150 MG/ML SOSY Inject into the skin.  every 4 weeks    [provider]  sertraline (ZOLOFT) 50 MG tablet Take 1 tablet (50 mg total) by mouth daily. 09/11/22   Bensimhon, Bevelyn Buckles, MD  sildenafil (VIAGRA) 50 MG tablet Take 1 tablet (50 mg total) by mouth daily as needed for erectile dysfunction. 09/11/22   Bensimhon, Bevelyn Buckles, MD  spironolactone (ALDACTONE) 25 MG tablet Take 0.5 tablets (12.5 mg total) by mouth daily. 09/11/22   Bensimhon, Bevelyn Buckles, MD  warfarin (COUMADIN) 5 MG tablet Take warfarin 1 - 1 1/2 tablets daily or as directed by coumadin clinic 03/18/23   [name], MD      Allergies    Patient has no known allergies.    Review of Systems   Review of Systems  All other systems reviewed and are negative.   Physical Exam Updated Vital Signs BP 98/69 (BP Location: Right Arm)   Pulse 70   Temp 98.3 F (36.8 C)  Resp 18   Ht 5\' 9"  (1.753 m)   Wt 103.9 kg   SpO2 100%   BMI 33.82 kg/m  Physical Exam Vitals and nursing note reviewed.   37 year old male, resting comfortably and in no acute distress. Vital signs are normal. Oxygen saturation is 100%, which is normal. Head is normocephalic and atraumatic. PERRLA, EOMI. Oropharynx is clear. Neck is nontender and supple without adenopathy or JVD. Back is nontender and there is no CVA tenderness. Lungs are clear without rales, wheezes, or rhonchi. Chest is nontender. Heart has regular rate and rhythm without murmur.  Mitral valve click is noted, relatively quiet. Abdomen is soft, flat, nontender. Extremities: There is edema involving the entire left arm, some  areas of skin breakdown noted in the proximal left upper arm.  There is no tenderness.  Radial pulses strong, capillary refill is prompt. Skin is warm and dry without rash. Neurologic: Awake and alert, normal mental status.  Moves all extremities equally except for left arm weakness which is chronic.  ED Results / Procedures / Treatments   Labs (all labs ordered are listed, but only abnormal results are displayed) Labs Reviewed  CBC - Abnormal; Notable for the following components:      Result Value   RBC 3.94 (*)    Hemoglobin 9.6 (*)    HCT 30.9 (*)    MCV 78.4 (*)    MCH 24.4 (*)    RDW 17.4 (*)    Platelets 429 (*)    All other components within normal limits  COMPREHENSIVE METABOLIC PANEL - Abnormal; Notable for the following components:   Sodium 133 (*)    Creatinine, Ser 1.30 (*)    Calcium 8.3 (*)    Total Protein 9.7 (*)    Albumin 2.1 (*)    All other components within normal limits  PROTIME-INR - Abnormal; Notable for the following components:   Prothrombin Time 42.9 (*)    INR 4.5 (*)    All other components within normal limits    EKG None  Radiology No results found.  Procedures Procedures  Cardiac monitor shows normal sinus rhythm, per my interpretation.  Medications Ordered in ED Medications - No data to display  ED Course/ Medical Decision Making/ A&P                                 Medical Decision Making Amount and/or Complexity of Data Reviewed Labs: ordered.  Risk Decision regarding hospitalization.   Apparent left subclavian vein DVT.  This is very concerning and that it occurred while anticoagulated on warfarin.  I have reviewed his laboratory tests, and my interpretation is mild hyponatremia which is not felt to be clinically significant, stable renal insufficiency, microcytic anemia with 2.3 g drop compared with 02/26/2023, mild thrombocytosis which is not felt to be clinically significant, supratherapeutic INR.  On review of his past  records, it is noted that INR was slightly subtherapeutic on 02/26/2023 which more or less coincides with when his arm started swelling.  However, INR on that date was 1.9 which still should have been adequate to prevent development of DVT.  INR earlier today was 7.0, on arrival in the hospital was 4.5.  I am unable to find the report for his ultrasound which showed DVT, but I do note a call from cardiology that they had received call from radiology of subclavian DVT seen on imaging today.  Given  development of significant DVT while on warfarin with supratherapeutic INR, I feel he will need to be admitted.  When INR allows, he will need to be switched to heparin, I am requesting pharmacy assist with this.  He will also need investigation for blood loss with drop in hemoglobin.  I have discussed the case with Dr.Howerter of [facility], who agrees to admit the patient.  Final Clinical Impression(s) / ED Diagnoses Final diagnoses:  Acute deep vein thrombosis (DVT) of other vein of left upper extremity (HCC)  Supratherapeutic INR  Microcytic anemia  Hyponatremia  Thrombocytosis    Rx / DC Orders ED Discharge Orders     None         Dione Booze, MD 04/02/23 0149

## 2023-04-01 NOTE — Telephone Encounter (Signed)
Elsia with [facility] left VM on triage line [phone] Called to report subclavian DVT seen on imaging today  -requested return call   Pt currently in ER   Message to provider as FYI only

## 2023-04-01 NOTE — ED Notes (Signed)
Pts right hand is swollen, cold, and pt states he can not feel my hand, but has a radial pulse of 70, <3 sec cap refill and 100% O2 sats when assessed.

## 2023-04-01 NOTE — Patient Instructions (Signed)
Hold warfarin x 4 days then continue warfarin 1 1/2 tablets daily except 1 tablet on Mondays, Wednesdays and Fridays Still taking Doxycycline x 3 more days Continue greens till finished Abx Recheck in 1 week Started minocycline 100mg  twice daily 07/23/22.  Can increase INR No S/S of bleeding.  Bleeding and fall precautions discussed with pt and he verbalized understanding.

## 2023-04-01 NOTE — ED Triage Notes (Signed)
PT arrives via POV. PT reports he has had a large hematoma on his left upper arm for about 1 month. Pt states he had an [place] earlier today and was told to come to the ED due to a blood clot. Imaging was done at [facility]. PT denies cp or sob. AxOx4. Pt reports he takes warfarin, denies missing any doses.

## 2023-04-01 NOTE — ED Notes (Signed)
CRITICAL VALUE STICKER  CRITICAL VALUE: INR: 4.5  RECEIVER (on-site recipient of call):Donald [name]  DATE & TIME NOTIFIED: 04/01/23 1844  MESSENGER (representative from lab):lab  MD NOTIFIED: Adela Lank  TIME OF NOTIFICATION:1844  RESPONSE:

## 2023-04-02 ENCOUNTER — Inpatient Hospital Stay (HOSPITAL_COMMUNITY): Payer: Medicaid Other

## 2023-04-02 DIAGNOSIS — Z7982 Long term (current) use of aspirin: Secondary | ICD-10-CM | POA: Diagnosis not present

## 2023-04-02 DIAGNOSIS — L88 Pyoderma gangrenosum: Secondary | ICD-10-CM | POA: Diagnosis not present

## 2023-04-02 DIAGNOSIS — M7989 Other specified soft tissue disorders: Secondary | ICD-10-CM | POA: Diagnosis not present

## 2023-04-02 DIAGNOSIS — R791 Abnormal coagulation profile: Secondary | ICD-10-CM | POA: Diagnosis not present

## 2023-04-02 DIAGNOSIS — Z833 Family history of diabetes mellitus: Secondary | ICD-10-CM | POA: Diagnosis not present

## 2023-04-02 DIAGNOSIS — L732 Hidradenitis suppurativa: Secondary | ICD-10-CM | POA: Diagnosis not present

## 2023-04-02 DIAGNOSIS — I517 Cardiomegaly: Secondary | ICD-10-CM | POA: Diagnosis not present

## 2023-04-02 DIAGNOSIS — D649 Anemia, unspecified: Secondary | ICD-10-CM | POA: Diagnosis not present

## 2023-04-02 DIAGNOSIS — Z820 Family history of epilepsy and other diseases of the nervous system: Secondary | ICD-10-CM | POA: Diagnosis not present

## 2023-04-02 DIAGNOSIS — I82622 Acute embolism and thrombosis of deep veins of left upper extremity: Secondary | ICD-10-CM

## 2023-04-02 DIAGNOSIS — E876 Hypokalemia: Secondary | ICD-10-CM | POA: Diagnosis not present

## 2023-04-02 DIAGNOSIS — E871 Hypo-osmolality and hyponatremia: Secondary | ICD-10-CM | POA: Diagnosis not present

## 2023-04-02 DIAGNOSIS — I4892 Unspecified atrial flutter: Secondary | ICD-10-CM | POA: Diagnosis not present

## 2023-04-02 DIAGNOSIS — I82602 Acute embolism and thrombosis of unspecified veins of left upper extremity: Secondary | ICD-10-CM | POA: Diagnosis not present

## 2023-04-02 DIAGNOSIS — Z818 Family history of other mental and behavioral disorders: Secondary | ICD-10-CM | POA: Diagnosis not present

## 2023-04-02 DIAGNOSIS — Z952 Presence of prosthetic heart valve: Secondary | ICD-10-CM | POA: Diagnosis not present

## 2023-04-02 DIAGNOSIS — D509 Iron deficiency anemia, unspecified: Secondary | ICD-10-CM | POA: Diagnosis not present

## 2023-04-02 DIAGNOSIS — G8929 Other chronic pain: Secondary | ICD-10-CM | POA: Diagnosis not present

## 2023-04-02 DIAGNOSIS — Z7901 Long term (current) use of anticoagulants: Secondary | ICD-10-CM | POA: Diagnosis not present

## 2023-04-02 DIAGNOSIS — I5022 Chronic systolic (congestive) heart failure: Secondary | ICD-10-CM | POA: Diagnosis not present

## 2023-04-02 DIAGNOSIS — I252 Old myocardial infarction: Secondary | ICD-10-CM | POA: Diagnosis not present

## 2023-04-02 DIAGNOSIS — D75839 Thrombocytosis, unspecified: Secondary | ICD-10-CM | POA: Diagnosis not present

## 2023-04-02 DIAGNOSIS — I081 Rheumatic disorders of both mitral and tricuspid valves: Secondary | ICD-10-CM | POA: Diagnosis not present

## 2023-04-02 DIAGNOSIS — Z86718 Personal history of other venous thrombosis and embolism: Secondary | ICD-10-CM | POA: Diagnosis not present

## 2023-04-02 DIAGNOSIS — Z87891 Personal history of nicotine dependence: Secondary | ICD-10-CM | POA: Diagnosis not present

## 2023-04-02 DIAGNOSIS — M79602 Pain in left arm: Secondary | ICD-10-CM | POA: Diagnosis not present

## 2023-04-02 LAB — CBC WITH DIFFERENTIAL/PLATELET
Abs Immature Granulocytes: 0.03 10*3/uL (ref 0.00–0.07)
Basophils Absolute: 0 10*3/uL (ref 0.0–0.1)
Basophils Relative: 0 %
Eosinophils Absolute: 0.1 10*3/uL (ref 0.0–0.5)
Eosinophils Relative: 1 %
HCT: 27 % — ABNORMAL LOW (ref 39.0–52.0)
Hemoglobin: 8.6 g/dL — ABNORMAL LOW (ref 13.0–17.0)
Immature Granulocytes: 0 %
Lymphocytes Relative: 15 %
Lymphs Abs: 1.4 10*3/uL (ref 0.7–4.0)
MCH: 24.3 pg — ABNORMAL LOW (ref 26.0–34.0)
MCHC: 31.9 g/dL (ref 30.0–36.0)
MCV: 76.3 fL — ABNORMAL LOW (ref 80.0–100.0)
Monocytes Absolute: 0.4 10*3/uL (ref 0.1–1.0)
Monocytes Relative: 5 %
Neutro Abs: 7.2 10*3/uL (ref 1.7–7.7)
Neutrophils Relative %: 79 %
Platelets: 361 10*3/uL (ref 150–400)
RBC: 3.54 MIL/uL — ABNORMAL LOW (ref 4.22–5.81)
RDW: 17.1 % — ABNORMAL HIGH (ref 11.5–15.5)
WBC: 9.1 10*3/uL (ref 4.0–10.5)
nRBC: 0 % (ref 0.0–0.2)

## 2023-04-02 LAB — COMPREHENSIVE METABOLIC PANEL
ALT: 13 U/L (ref 0–44)
AST: 21 U/L (ref 15–41)
Albumin: 1.7 g/dL — ABNORMAL LOW (ref 3.5–5.0)
Alkaline Phosphatase: 90 U/L (ref 38–126)
Anion gap: 9 (ref 5–15)
BUN: 9 mg/dL (ref 6–20)
CO2: 24 mmol/L (ref 22–32)
Calcium: 8.6 mg/dL — ABNORMAL LOW (ref 8.9–10.3)
Chloride: 102 mmol/L (ref 98–111)
Creatinine, Ser: 1.06 mg/dL (ref 0.61–1.24)
GFR, Estimated: >60 mL/min (ref >60)
Glucose, Bld: 107 mg/dL — ABNORMAL HIGH (ref 70–99)
Potassium: 3.1 mmol/L — ABNORMAL LOW (ref 3.5–5.1)
Sodium: 135 mmol/L (ref 135–145)
Total Bilirubin: 0.2 mg/dL — ABNORMAL LOW (ref 0.3–1.2)
Total Protein: 8.3 g/dL — ABNORMAL HIGH (ref 6.5–8.1)

## 2023-04-02 LAB — PROTIME-INR
INR: 4.8 (ref 0.8–1.2)
Prothrombin Time: 45.5 s — ABNORMAL HIGH (ref 11.4–15.2)

## 2023-04-02 LAB — MAGNESIUM: Magnesium: 1.4 mg/dL — ABNORMAL LOW (ref 1.7–2.4)

## 2023-04-02 MED ORDER — DAPAGLIFLOZIN PROPANEDIOL 10 MG PO TABS
10.0000 mg | ORAL_TABLET | Freq: Every day | ORAL | Status: DC
  Administered 2023-04-02 – 2023-04-03 (×2): 10 mg via ORAL
  Filled 2023-04-02 (×2): qty 1

## 2023-04-02 MED ORDER — MAGNESIUM SULFATE 4 GM/100ML IV SOLN
4.0000 g | Freq: Once | INTRAVENOUS | Status: AC
  Administered 2023-04-02: 4 g via INTRAVENOUS
  Filled 2023-04-02: qty 100

## 2023-04-02 MED ORDER — ACETAMINOPHEN 650 MG RE SUPP: 650.0000 mg | Freq: Four times a day (QID) | RECTAL | Status: DC | PRN

## 2023-04-02 MED ORDER — DIGOXIN 125 MCG PO TABS
0.1250 mg | ORAL_TABLET | Freq: Every day | ORAL | Status: DC
  Administered 2023-04-02 – 2023-04-03 (×2): 0.125 mg via ORAL
  Filled 2023-04-02 (×2): qty 1

## 2023-04-02 MED ORDER — NALOXONE HCL 0.4 MG/ML IJ SOLN: 0.4000 mg | INTRAMUSCULAR | Status: DC | PRN

## 2023-04-02 MED ORDER — IOHEXOL 350 MG/ML SOLN
50.0000 mL | Freq: Once | INTRAVENOUS | Status: AC | PRN
  Administered 2023-04-02: 50 mL via INTRAVENOUS

## 2023-04-02 MED ORDER — POTASSIUM CHLORIDE CRYS ER 20 MEQ PO TBCR
40.0000 meq | EXTENDED_RELEASE_TABLET | ORAL | Status: AC
  Administered 2023-04-02 (×2): 40 meq via ORAL
  Filled 2023-04-02 (×2): qty 2

## 2023-04-02 MED ORDER — GABAPENTIN 300 MG PO CAPS
600.0000 mg | ORAL_CAPSULE | Freq: Two times a day (BID) | ORAL | Status: DC
  Administered 2023-04-02 – 2023-04-03 (×3): 600 mg via ORAL
  Filled 2023-04-02 (×4): qty 2

## 2023-04-02 MED ORDER — SERTRALINE HCL 50 MG PO TABS
50.0000 mg | ORAL_TABLET | Freq: Every day | ORAL | Status: DC
  Administered 2023-04-02 – 2023-04-03 (×2): 50 mg via ORAL
  Filled 2023-04-02 (×2): qty 1

## 2023-04-02 MED ORDER — AMIODARONE HCL 200 MG PO TABS
200.0000 mg | ORAL_TABLET | Freq: Every day | ORAL | Status: DC
  Administered 2023-04-02 – 2023-04-03 (×2): 200 mg via ORAL
  Filled 2023-04-02 (×2): qty 1

## 2023-04-02 MED ORDER — SPIRONOLACTONE 12.5 MG HALF TABLET
12.5000 mg | ORAL_TABLET | Freq: Every day | ORAL | Status: DC
  Administered 2023-04-02 – 2023-04-03 (×2): 12.5 mg via ORAL
  Filled 2023-04-02 (×2): qty 1

## 2023-04-02 MED ORDER — ACETAMINOPHEN 325 MG PO TABS: 650.0000 mg | ORAL_TABLET | Freq: Four times a day (QID) | ORAL | Status: DC | PRN

## 2023-04-02 MED ORDER — PANTOPRAZOLE SODIUM 40 MG PO TBEC
40.0000 mg | DELAYED_RELEASE_TABLET | Freq: Every day | ORAL | Status: DC
  Administered 2023-04-02 – 2023-04-03 (×2): 40 mg via ORAL
  Filled 2023-04-02 (×2): qty 1

## 2023-04-02 MED ORDER — MELATONIN 3 MG PO TABS: 3.0000 mg | ORAL_TABLET | Freq: Every evening | ORAL | Status: DC | PRN

## 2023-04-02 MED ORDER — FERROUS SULFATE 325 (65 FE) MG PO TABS
325.0000 mg | ORAL_TABLET | Freq: Every day | ORAL | Status: DC
  Administered 2023-04-02 – 2023-04-03 (×2): 325 mg via ORAL
  Filled 2023-04-02 (×2): qty 1

## 2023-04-02 MED ORDER — FUROSEMIDE 20 MG PO TABS
20.0000 mg | ORAL_TABLET | Freq: Every day | ORAL | Status: DC
  Administered 2023-04-02 – 2023-04-03 (×2): 20 mg via ORAL
  Filled 2023-04-02 (×2): qty 1

## 2023-04-02 MED ORDER — FENTANYL CITRATE PF 50 MCG/ML IJ SOSY: 25.0000 ug | PREFILLED_SYRINGE | INTRAMUSCULAR | Status: DC | PRN

## 2023-04-02 NOTE — Consult Note (Signed)
WOC Nurse Consult Note: History Hidradenitis Suppurativa Reason for Consult: Patient with many firm, tender abscesses/nodules with sinus tracts draining purulent fluid, mostly on left axilla and superior gluteal fold. There are hyperpigmented and hypertrophic band-like scars.   Wound type: inflammatory Pressure Injury POA: NA Measurement: Scattered nonintact lesions to left axilla and gluteal fold, draining creamy purulent effluent.  Musty odor.  Uses foam dressings at home. Scarring to buttocks, arms and axilla from previous ulcerations.  Wound bed: red and moist Drainage (amount, consistency, odor) purulent  musty odor Periwound: scarring  Dressing procedure/placement/frequency: Cleanse left axilla and buttocks lesions with VASHE cleanse  (LAWSON [id number]) Cover open areas with Aquacel (LAWSON [id number]) for absorption and place ABD Pads in skin folds in these areas. Change daily.  Will not follow at this time.  Please re-consult if needed.  Mike Gip MSN, RN, FNP-BC CWON Wound, Ostomy, Continence Nurse Outpatient [facility] [phone] Pager [phone]

## 2023-04-02 NOTE — Progress Notes (Signed)
PHARMACY - ANTICOAGULATION CONSULT NOTE  Pharmacy Consult for heparin Indication: mechanical MVR + AFib  No Known Allergies  Patient Measurements: Height: 5\' 9"  (175.3 cm) Weight: 103.9 kg (229 lb) IBW/kg (Calculated) : 70.7 Heparin Dosing Weight: 103 kg  Vital Signs: Temp: 97.9 F (36.6 C) (10/17 0808) Temp Source: Oral (10/17 0808) BP: 108/67 (10/17 0808) Pulse Rate: 62 (10/17 0808)  Labs: Recent Labs    04/01/23 1111 04/01/23 1716 04/02/23 0727  HGB  --  9.6* 8.6*  HCT  --  30.9* 27.0*  PLT  --  429* 361  LABPROT  --  42.9* 45.5*  INR 7.0* 4.5* 4.8*  CREATININE  --  1.30* 1.06    Estimated Creatinine Clearance: 116.7 mL/min (by C-G formula based on SCr of 1.06 mg/dL).   Medical History: Past Medical History:  Diagnosis Date   Anemia    Autoimmune disorder (HCC)    pyoderma gangrenosum   CHF (congestive heart failure) (HCC)    Chronic systolic heart failure (HCC)    a. EF 35-40% by echo in 07/2018 b. EF at 45% by repeat echo in 04/2020   DVT (deep venous thrombosis) (HCC)    h/o   Dysrhythmia    Mitral regurgitation    a. 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 b. 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HCC)    arthritis   Shock, septic and cardiogenic 12/28/2021   Tricuspid regurgitation      Assessment: 36 yoM with hx mMVR and AFib on warfarin PTA admitted with arm swelling. Pharmacy to dose IV heparin.   10/17 AM: Repeat INR today  4.8. Will continue to hold heparin until INR is below INR goal of 2.5.   Goal of Therapy:  Heparin level 0.3-0.7 units/ml Monitor platelets by anticoagulation protocol: Yes   Plan:  Continue to hold heparin  INR with am labs  Jani Gravel, PharmD Clinical Pharmacist  04/02/2023 9:04 AM

## 2023-04-02 NOTE — Plan of Care (Signed)
Pt alert and oriented, ambulatory. Multiple firm nodules oozing with purulent fluid noted on pt's bilateral gluteal folds and left axilla. RN packed with ABD pads and foam dressing on the sites. WOC consult is placed.  Problem: Education: Goal: Knowledge of General Education information will improve Description: Including pain rating scale, medication(s)/side effects and non-pharmacologic comfort measures Outcome: Progressing   Problem: Health Behavior/Discharge Planning: Goal: Ability to manage health-related needs will improve Outcome: Progressing   Problem: Activity: Goal: Risk for activity intolerance will decrease Outcome: Progressing

## 2023-04-02 NOTE — Progress Notes (Signed)
  Carryover admission to the Day Admitter.  I discussed this case with the EDP, Dr. Preston Fleeting.  Per these discussions:   This is a 37 year old male with chronic left upper extremity weakness, history of mitral valve replacement, chronically anticoagulated on warfarin, who is being admitted with suspected subacute left subclavian DVT.  The patient reports 1 month of swelling involving the left upper extremity, prompting outpatient imaging that revealed left subclavian DVT.  Outpatient cardiology clinic received notification of this imaging result, prompting recommendation for the patient to present to the emergency department for further evaluation management thereof.  It is unclear who performed the imaging of the left upper extremity to reveal the left subclavian DVT, and there is no formal report of this imaging in epic, including care everywhere at this time.   The patient reports that development of this left subclavian DVT occurred in spite of chronic anticoagulation on warfarin for history of mitral valve replacement, with INR 1 month ago noted to be 1.9.  Today INR is noted to be 4.5.  Of note, the patient also has a history of left upper extremity weakness.  This is reported to stem from a hematoma 1 year ago that was in proximity to the brachial plexus resulting in this left upper extremity weakness.  EDP has consulted pharmacy for initiation of heparin drip.  Repeat INR ordered in the morning.  I have placed an order for inpatient admission to med/tele for further evaluation management of the above.  I have placed some additional preliminary admit orders via the adult multi-morbid admission order set. I have also ordered a vascular ultrasound of the left upper extremity to further evaluate the patient suspected subacute left subclavian DVT.  Of also ordered prn IV fentanyl.    Newton Pigg, DO Hospitalist

## 2023-04-02 NOTE — Plan of Care (Signed)
Patient is alert/oriented X4. Patient compliant with medication administration and tolerated IV abx. Patient dressings changed on sacrum and L axilla. Patient had serosanguinous and purulent drainage from both wounds. Patient remains on telemetry running normal sinus rhythm.  VSS, no complaints at this time.   Problem: Education: Goal: Knowledge of General Education information will improve Description: Including pain rating scale, medication(s)/side effects and non-pharmacologic comfort measures Outcome: Progressing   Problem: Health Behavior/Discharge Planning: Goal: Ability to manage health-related needs will improve Outcome: Progressing   Problem: Clinical Measurements: Goal: Ability to maintain clinical measurements within normal limits will improve Outcome: Progressing   Problem: Clinical Measurements: Goal: Will remain free from infection Outcome: Progressing   Problem: Clinical Measurements: Goal: Diagnostic test results will improve Outcome: Progressing   Problem: Clinical Measurements: Goal: Respiratory complications will improve Outcome: Progressing   Problem: Clinical Measurements: Goal: Cardiovascular complication will be avoided Outcome: Progressing   Problem: Activity: Goal: Risk for activity intolerance will decrease Outcome: Progressing   Problem: Nutrition: Goal: Adequate nutrition will be maintained Outcome: Progressing   Problem: Coping: Goal: Level of anxiety will decrease Outcome: Progressing   Problem: Elimination: Goal: Will not experience complications related to bowel motility Outcome: Progressing   Problem: Elimination: Goal: Will not experience complications related to urinary retention Outcome: Progressing   Problem: Pain Managment: Goal: General experience of comfort will improve Outcome: Progressing   Problem: Safety: Goal: Ability to remain free from injury will improve Outcome: Progressing   Problem: Skin Integrity: Goal:  Risk for impaired skin integrity will decrease Outcome: Progressing   Problem: Education: Goal: Understanding of post-operative needs will improve Outcome: Progressing   Problem: Education: Goal: Individualized Educational Video(s) Outcome: Progressing   Problem: Clinical Measurements: Goal: Postoperative complications will be avoided or minimized Outcome: Progressing   Problem: Respiratory: Goal: Will regain and/or maintain adequate ventilation Outcome: Progressing

## 2023-04-02 NOTE — Progress Notes (Signed)
Left upper extremity venous duplex has been completed. Preliminary results can be found in CV Proc through chart review.   04/02/23 1:50 PM Olen Cordial RVT

## 2023-04-02 NOTE — Consult Note (Signed)
[facility] OF [employer]  ASSESSMENT / PLAN: 37 y.o. 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 This was seen on an outside duplex, but we do not see this today. I counseled the patient that he is already on best medical therapy for thrombosis and this may have spontaneously resolved. He and his sister are, understandably, nervous about the discrepancy in these findings. I've ordered a CT scan of the chest to better evaluate. I'll review these findings with him tomorrow.   CHIEF COMPLAINT: possible left subclavian thrombosis  HISTORY OF PRESENT ILLNESS: Donald [name] is a 37 y.o. 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  Patient had an outpatient duplex done in [facility].  This showed possible nonocclusive thrombus of the left subclavian vein.  This is done for evaluation of left upper extremity edema.  Patient has a complex left upper extremity history.  He had a hematoma that required evacuation at [facility].  Since that time he has had a weakened left upper extremity.  He has no significant swelling in his left upper extremity.  No cause for this has been identified as of yet.  Past Medical History:  Diagnosis Date   Anemia    Autoimmune disorder (HCC)    pyoderma gangrenosum   CHF (congestive heart failure) (HCC)    Chronic systolic heart failure (HCC)    a. EF 35-40% by echo in 07/2018 b. EF at 45% by repeat echo in 04/2020   DVT (deep venous thrombosis) (HCC)    h/o   Dysrhythmia    Mitral regurgitation    a. 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 b. 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HCC)    arthritis   Shock, septic and cardiogenic 12/28/2021   Tricuspid regurgitation      Past Surgical History:  Procedure Laterality Date   CARDIAC CATHETERIZATION     HERNIA REPAIR Right 2018   MITRAL VALVE REPAIR  06/07/2018   [facility] - Dr Meda Klinefelter   MITRAL VALVE REPLACEMENT N/A 06/23/2021   MULTIPLE EXTRACTIONS WITH ALVEOLOPLASTY N/A 11/08/2020   Procedure: EXTRACTION OF TEETH NUMBER ONE, FIFTHTEEN, SIXTEEN, SEVENTEEN, EIGHTTEEN AND THRTY WITH ALVEOLOPLASTY OF LOWER LEFT QUADRANT.;  Surgeon: Sharman Cheek, DMD;  Location: [facility] OR;  Service: Dentistry;  Laterality: N/A;   RIGHT/LEFT HEART CATH AND CORONARY ANGIOGRAPHY N/A 09/11/2020   Procedure: RIGHT/LEFT HEART CATH AND CORONARY ANGIOGRAPHY;  Surgeon: Dolores Patty, MD;  Location: [facility] INVASIVE CV LAB;  Service: Cardiovascular;  Laterality: N/A;   TEE WITHOUT CARDIOVERSION N/A 05/23/2020   Procedure: TRANSESOPHAGEAL ECHOCARDIOGRAM (TEE) WITH PROPOFOL;  Surgeon: [name], MD;  Location: [facility] ENDO SUITE;  Service: Endoscopy;  Laterality: N/A;    Family History  Problem Relation Age of Onset   Multiple sclerosis Mother    Psoriasis Mother    Depression Father    Diabetes Father    Diabetes Paternal Grandmother     Social History   Socioeconomic History   Marital status: Single    Spouse name: Not on file   Number of children: Not on file   Years of education: Not on file   Highest education level: Not on file  Occupational History   Not on file  Tobacco Use   Smoking status: Former    Current packs/day: 0.00    Types: Cigarettes    Quit date: 10/14/2020    Years since quitting: 2.4   Smokeless tobacco: Never  Vaping Use   Vaping status: Never Used  Substance and Sexual Activity   Alcohol use: No   Drug use: No   Sexual activity: Yes  Other Topics Concern   Not on file  Social History Narrative   Left handed       Are you currently employed ? no   What is your current occupation?   Do you live at home alone?no   Who lives with you? son   What type of home do you  live in: 1 story or 2 story? one   Caffeine no    Social Determinants of Health   Financial Resource Strain: Low Risk  (03/13/2021)   Received from [facility], [facility]   Overall Financial Resource Strain (CARDIA)    Difficulty of Paying Living Expenses: Not hard at all  Food Insecurity: No Food Insecurity (03/13/2021)   Received from [facility], [facility]   Hunger Vital Sign    Worried About Running Out of Food in the Last Year: Never true    Ran Out of Food in the Last Year: Never true  Transportation Needs: No Transportation Needs (01/18/2022)   Received from [facility], [facility]   [facility] - Transportation    In the past 12 months, has lack of transportation kept you from medical appointments or from getting medications?: No    Lack of Transportation (Non-Medical): No  Physical Activity: Insufficiently Active (05/18/2020)   Received from [facility], [facility]   Exercise Vital Sign    Days of Exercise per Week: 7 days    Minutes of Exercise per Session: 20 min  Stress: No Stress Concern Present (05/18/2020)   Received from [facility], [facility] - Occupational Stress Questionnaire    Feeling of Stress : Not at all  Social Connections: Socially Isolated (05/18/2020)   Received from [facility], [facility]   Social Connection and Isolation Panel [NHANES]    Frequency of Communication with Friends and Family: More than three times a week    Frequency of Social Gatherings with Friends and Family: More than three times a week    Attends Religious Services: Never    Database administrator or Organizations: No    Attends Banker Meetings: Never    Marital Status: Never married  Intimate Partner Violence: Not At Risk (05/18/2020)   Received from [facility], [facility]   Humiliation, Afraid, Rape, and Kick questionnaire    Fear of  Current or Ex-Partner: No    Emotionally Abused: No    Physically Abused: No    Sexually Abused: No    No Known Allergies  Current Facility-Administered Medications  Medication Dose Route Frequency Provider Last Rate Last Admin   acetaminophen (TYLENOL) tablet 650 mg  650 mg Oral Q6H PRN Howerter, Justin B, DO       Or   acetaminophen (TYLENOL) suppository 650 mg  650 mg Rectal Q6H PRN Howerter, Justin B, DO       amiodarone (PACERONE) tablet 200 mg  200 mg Oral Daily Rosezetta Schlatter T, MD   200 mg at 04/02/23 1303   dapagliflozin  propanediol (FARXIGA) tablet 10 mg  10 mg Oral Daily Rosezetta Schlatter T, MD   10 mg at 04/02/23 1303   digoxin (LANOXIN) tablet 0.125 mg  0.125 mg Oral Daily Rosezetta Schlatter T, MD   0.125 mg at 04/02/23 1303   fentaNYL (SUBLIMAZE) injection 25 mcg  25 mcg Intravenous Q2H PRN Howerter, Justin B, DO       ferrous sulfate tablet 325 mg  325 mg Oral Daily Rosezetta Schlatter T, MD   325 mg at 04/02/23 1303   furosemide (LASIX) tablet 20 mg  20 mg Oral Daily Rosezetta Schlatter T, MD   20 mg at 04/02/23 1303   gabapentin (NEURONTIN) capsule 600 mg  600 mg Oral BID Rosezetta Schlatter T, MD   600 mg at 04/02/23 1303   melatonin tablet 3 mg  3 mg Oral QHS PRN Howerter, Justin B, DO       naloxone (NARCAN) injection 0.4 mg  0.4 mg Intravenous PRN Howerter, Justin B, DO       pantoprazole (PROTONIX) EC tablet 40 mg  40 mg Oral Daily Rosezetta Schlatter T, MD   40 mg at 04/02/23 1303   sertraline (ZOLOFT) tablet 50 mg  50 mg Oral Daily Rosezetta Schlatter T, MD   50 mg at 04/02/23 1303   spironolactone (ALDACTONE) tablet 12.5 mg  12.5 mg Oral Daily Rosezetta Schlatter T, MD   12.5 mg at 04/02/23 1303    PHYSICAL EXAM Vitals:   04/02/23 0310 04/02/23 0407 04/02/23 0808 04/02/23 1404  BP: 112/82 92/73 108/67 113/66  Pulse: 79 75 62 66  Resp: (!) 22 18 17 17   Temp: 98.3 F (36.8 C) 98.2 F (36.8 C) 97.9 F (36.6 C) 98.6 F (37 C)  TempSrc:  Oral Oral Oral  SpO2: 98% 99% 100% 100%  Weight:      Height:        Young man.  Appears chronically ill. Regular rate and rhythm Unlabored breathing Left upper extremity with "claw" deformity of hand Chronic skin changes in the left axilla consistent with hidradenitis suppurativa 2+ radial pulse on the left Edema about the upper arm, but not distal arm.  PERTINENT LABORATORY AND RADIOLOGIC DATA  Most recent CBC    Latest Ref Rng & Units 04/02/2023    7:27 AM 04/01/2023    5:16 PM 02/26/2023   12:58 PM  CBC  WBC 4.0 - 10.5 K/uL 9.1  9.9  9.8   Hemoglobin 13.0 - 17.0 g/dL 8.6  9.6  16.1   Hematocrit 39.0 - 52.0 % 27.0  30.9  36.6   Platelets 150 - 400 K/uL 361  429  472      Most recent CMP    Latest Ref Rng & Units 04/02/2023    7:27 AM 04/01/2023    5:16 PM 02/26/2023   12:58 PM  CMP  Glucose 70 - 99 mg/dL 096  91  045   BUN 6 - 20 mg/dL 9  12  18    Creatinine 0.61 - 1.24 mg/dL 4.09  8.11  9.14   Sodium 135 - 145 mmol/L 135  133  131   Potassium 3.5 - 5.1 mmol/L 3.1  3.5  3.9   Chloride 98 - 111 mmol/L 102  102  96   CO2 22 - 32 mmol/L 24  23  28    Calcium 8.9 - 10.3 mg/dL 8.6  8.3  8.5   Total Protein 6.5 - 8.1 g/dL 8.3  9.7    Total Bilirubin  0.3 - 1.2 mg/dL 0.2  0.3    Alkaline Phos 38 - 126 U/L 90  105    AST 15 - 41 U/L 21  25    ALT 0 - 44 U/L 13  11      Renal function Estimated Creatinine Clearance: 116.7 mL/min (by C-G formula based on SCr of 1.06 mg/dL).  Hgb A1c MFr Bld (%)  Date Value  01/10/2022 5.6    LDL Cholesterol  Date Value Ref Range Status  01/10/2022 163 (H) 0 - 99 mg/dL Final    Comment:           Total Cholesterol/HDL:CHD Risk Coronary Heart Disease Risk Table                     Men   Women  1/2 Average Risk   3.4   3.3  Average Risk       5.0   4.4  2 X Average Risk   9.6   7.1  3 X Average Risk  23.4   11.0        Use the calculated Patient Ratio above and the CHD Risk Table to determine the patient's CHD Risk.        ATP III CLASSIFICATION (LDL):  <100     mg/dL   Optimal  865-784   mg/dL   Near or Above                    Optimal  130-159  mg/dL   Borderline  696-295  mg/dL   High  >284     mg/dL   Very High Performed at [facility], [address], [place], [place] [address]     upper extremity venous duplex  Right:  No evidence of thrombosis in the subclavian.    Left:  No evidence of deep vein thrombosis in the upper extremity. No evidence of  superficial vein thrombosis in the upper extremity.     Outside duplex shows possible nonocclusive thrombus in the left subclavian vein.  I am unable to review the images.  Rande Brunt. Lenell Antu, MD [facility] Vascular [facility] Vein Specialists of [facility] Phone Number: [phone] 04/02/2023 3:18 PM   Total time spent on preparing this encounter including chart review, data review, collecting history, examining the patient, coordinating care for this new patient, 45 minutes.  Portions of this report may have been transcribed using voice recognition software.  Every effort has been made to ensure accuracy; however, inadvertent computerized transcription errors may still be present.

## 2023-04-02 NOTE — Progress Notes (Signed)
PHARMACY - ANTICOAGULATION CONSULT NOTE  Pharmacy Consult for heparin Indication: mechanical MVR + AFib  No Known Allergies  Patient Measurements: Height: 5\' 9"  (175.3 cm) Weight: 103.9 kg (229 lb) IBW/kg (Calculated) : 70.7 Heparin Dosing Weight: 103 kg  Vital Signs: Temp: 98.3 F (36.8 C) (10/16 2224) Temp Source: Oral (10/16 1656) BP: 117/78 (10/16 2345) Pulse Rate: 64 (10/16 2345)  Labs: Recent Labs    04/01/23 1111 04/01/23 1716  HGB  --  9.6*  HCT  --  30.9*  PLT  --  429*  LABPROT  --  42.9*  INR 7.0* 4.5*  CREATININE  --  1.30*    Estimated Creatinine Clearance: 95.1 mL/min (A) (by C-G formula based on SCr of 1.3 mg/dL (H)).   Medical History: Past Medical History:  Diagnosis Date   Anemia    Autoimmune disorder (HCC)    pyoderma gangrenosum   CHF (congestive heart failure) (HCC)    Chronic systolic heart failure (HCC)    a. EF 35-40% by echo in 07/2018 b. EF at 45% by repeat echo in 04/2020   DVT (deep venous thrombosis) (HCC)    h/o   Dysrhythmia    Mitral regurgitation    a. 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 b. 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HCC)    arthritis   Shock, septic and cardiogenic 12/28/2021   Tricuspid regurgitation      Assessment: 36 yoM with hx mMVR and AFib on warfarin PTA admitted with arm swelling. Pharmacy to dose IV heparin. INR 4.5 on admit so will hold for now.  Goal of Therapy:  Heparin level 0.3-0.7 units/ml Monitor platelets by anticoagulation protocol: Yes   Plan:  Hold heparin tonight INR with am labs  Fredonia Highland, PharmD, BCPS, [facility] Clinical Pharmacist Please check AMION for all [facility] Pharmacy numbers 04/02/2023

## 2023-04-02 NOTE — Progress Notes (Signed)
CT called for order.

## 2023-04-02 NOTE — H&P (Addendum)
History and Physical    Patient: Donald [name] WUJ:[id number] DOB: 1985/09/23 DOA: 04/01/2023 DOS: the patient was seen and examined on 04/02/2023 PCP: BucioJulian Reil, FNP  Patient coming from: Home  Chief Complaint: Left upper extremity swelling and pain Chief Complaint  Patient presents with   Arm Pain   Arm Swelling   HPI: Donald [name] is a 37 y.o. male with medical history significant of chronic systolic congestive heart failure, chronic anemia, autoimmune disease, pyoderma gangrenosum, dysrhythmia, atrial flutter, hidradenitis suppurativa, patient follows up with outpatient neurologist, dermatologist.  According to him he has been having swelling of the left upper extremity that has been going on for the last 1 month.  He was admitted last year after he developed hematoma in the left that he underwent evacuation and according to him since last year he has been having weakness involving the left for which he has been following up with neurologist.  He followed up with neurologist a month ago and according to him he was found to have swelling of the left and so the neurologist referred him to see his primary care physician for patient was scheduled for an outpatient ultrasound and he was called with the findings of subclavian DVT and was informed to come to the emergency room for further management.  He admits to associated pain with intensity 5/10, no fever, nausea, vomiting, abdominal pain, chest pain or urinary complaints. ED course: Upon arrival to the emergency room patient was found to have temperature 98.4 respiratory rate 16 pulse 76 blood pressure 117/78 saturating 100% on room air.  Given above concerns hospitalist service was contacted to admit patient for further management.  Review of Systems: As mentioned in the history of present illness. All other systems reviewed and are negative. Past Medical History:  Diagnosis Date   Anemia    Autoimmune disorder (HCC)     pyoderma gangrenosum   CHF (congestive heart failure) (HCC)    Chronic systolic heart failure (HCC)    a. EF 35-40% by echo in 07/2018 b. EF at 45% by repeat echo in 04/2020   DVT (deep venous thrombosis) (HCC)    h/o   Dysrhythmia    Mitral regurgitation    a. 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 b. 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HCC)    arthritis   Shock, septic and cardiogenic 12/28/2021   Tricuspid regurgitation    Past Surgical History:  Procedure Laterality Date   CARDIAC CATHETERIZATION     HERNIA REPAIR Right 2018   MITRAL VALVE REPAIR  06/07/2018   [facility] - Dr Meda Klinefelter   MITRAL VALVE REPLACEMENT N/A 06/23/2021   MULTIPLE EXTRACTIONS WITH ALVEOLOPLASTY N/A 11/08/2020   Procedure: EXTRACTION OF TEETH NUMBER ONE, FIFTHTEEN, SIXTEEN, SEVENTEEN, EIGHTTEEN AND THRTY WITH ALVEOLOPLASTY OF LOWER LEFT QUADRANT.;  Surgeon: Sharman Cheek, DMD;  Location: [facility] OR;  Service: Dentistry;  Laterality: N/A;   RIGHT/LEFT HEART CATH AND CORONARY ANGIOGRAPHY N/A 09/11/2020   Procedure: RIGHT/LEFT HEART CATH AND CORONARY ANGIOGRAPHY;  Surgeon: Dolores Patty, MD;  Location: [facility] INVASIVE CV LAB;  Service: Cardiovascular;  Laterality: N/A;   TEE WITHOUT CARDIOVERSION N/A 05/23/2020   Procedure: TRANSESOPHAGEAL ECHOCARDIOGRAM (TEE) WITH PROPOFOL;  Surgeon: [name], MD;  Location: [facility] ENDO [facility];  Service: Endoscopy;  Laterality: N/A;   Social History:  reports that he quit smoking about 2 years ago. His smoking use included cigarettes. He has never used smokeless tobacco. He reports that he does not drink alcohol and does not use drugs.  No Known Allergies  Family History  Problem Relation Age of Onset   Multiple sclerosis  Mother    Psoriasis Mother    Depression Father    Diabetes Father    Diabetes Paternal Grandmother     Prior to Admission medications   Medication Sig Start Date End Date Taking? Authorizing Provider  acetaminophen (TYLENOL) 325 MG tablet Take 2 tablets (650 mg total) by mouth every 4 (four) hours as needed for headache or mild pain. 06/04/21  Yes Clegg, Amy D, NP  amiodarone (PACERONE) 200 MG tablet Take 1 tablet by mouth once daily 10/15/22  Yes Bensimhon, Bevelyn Buckles, MD  digoxin (LANOXIN) 0.125 MG tablet Take 1 tablet (0.125 mg total) by mouth daily. 09/11/22  Yes Bensimhon, Bevelyn Buckles, MD  FARXIGA 10 MG TABS tablet Take 1 tablet by mouth once daily 11/27/22  Yes Bensimhon, Bevelyn Buckles, MD  Ferrous Sulfate 50 MG TBCR Take 50 mg by mouth daily. 03/27/23  Yes [provider]  furosemide (LASIX) 40 MG tablet Take 40 mg by mouth daily.   Yes [provider]  gabapentin (NEURONTIN) 300 MG capsule Take 2 capsules (600 mg total) by mouth 2 (two) times daily. 02/27/23  Yes Antony Madura, MD  pantoprazole (PROTONIX) 40 MG tablet Take 1 tablet (40 mg total) by mouth daily. 10/16/22  Yes Alen Bleacher, NP  potassium chloride SA (KLOR-CON M) 20 MEQ tablet Take 20 mEq by mouth every Monday, Wednesday, and Friday.   Yes [provider]  Secukinumab, 300 MG Dose, (COSENTYX, 300 MG DOSE,) 150 MG/ML SOSY Inject 150 mg into the skin every 14 (fourteen) days.  every 4 weeks   Yes [provider]  sertraline (ZOLOFT) 50 MG tablet Take 1 tablet (50 mg total) by mouth daily. 09/11/22  Yes Bensimhon, Bevelyn Buckles, MD  sildenafil (VIAGRA) 50 MG tablet Take 1 tablet (50 mg total) by mouth daily as needed for erectile dysfunction. 09/11/22  Yes Bensimhon, Bevelyn Buckles, MD  spironolactone (ALDACTONE) 25 MG tablet Take 0.5 tablets (12.5 mg total) by mouth daily. 09/11/22  Yes Bensimhon, Bevelyn Buckles, MD  warfarin (COUMADIN) 5 MG tablet Take warfarin 1 - 1 1/2 tablets daily or as directed by coumadin  clinic Patient taking differently: Take 5-7.5 mg by mouth See admin instructions. Take 1 & 1/2 tablets daily except 1 tablet on Mondays, Wednesdays and Fridays 03/18/23  Yes [name], MD    Physical Exam: Vitals:   04/02/23 0115 04/02/23 0310 04/02/23 0407 04/02/23 0808  BP: 115/76 112/82 92/73 108/67  Pulse: 72 79 75 62  Resp: 14 (!) 22 18 17   Temp:  98.3 F (36.8 C) 98.2 F (36.8 C) 97.9 F (36.6 C)  TempSrc:   Oral Oral  SpO2: 96% 98% 99% 100%  Weight:      Height:       General: Patient seen and examined at bedside this morning in no acute distress CNS: Alert and oriented x 3 moving all extremities with limited movement of the left upper extremity on account of pain and swelling CVS: S1-S2 present no murmur appreciated Respiratory: Clear to auscultation bilaterally with decreased air entry at the bases Abdominal: Nontender not distended bowel  full and obese Musculoskeletal: Swelling noted to the left upper extremity with inability to make a fist on the left hand, findings of hidradenitis suppurativa noted in the left armpit Psychiatry: Normal mood   Data Reviewed: I have reviewed ED physician documentation, briefly reviewed patient's previous admission records, I have also reviewed nursing documentation, I have reviewed patient's vitals as well as labs as shown, I have independently reviewed patient's telemetry showing findings of atrial flutter.   Assessment and Plan: Left upper extremity swelling and pain secondary to possible subclavian DVT Patient comes in after sent in over by primary care physician on account of outpatient ultrasound showing possible subclavian DVT. Patient takes warfarin at home which is being held Pharmacist consulted for IV heparin dosing Continue left upper extremity elevation I have discussed the case with vascular surgeon and consult placed   Supratherapeutic INR Presented with INR of 4.5 Will monitor INR  closely  Hypokalemia Continue repletion and monitoring  Low magnesium  Continue repletion and monitoring  Valvular heart disease currently on warfarin We will hold warfarin at this time as patient is supratherapeutic and may need surgical intervention  chronic systolic congestive heart failure,  We will continue home medication: Lasix, spironolactone Blood pressure is soft we will monitor closely  chronic anemia,  No indication for blood transfusion at this time Monitor CBC closely   History of atrial flutter Currently rate controlled Patient takes anticoagulation at home We will monitor closely  Hidradenitis suppurativa Patient follows up with dermatologist outpatient Outpatient follow-up    Advance Care Planning:   Code Status: Full Code full code  Consults: Discussed surgery  Family Communication: I discussed with patient's sister over the phone at the patient's bedside  Severity of Illness: The appropriate patient status for this patient is INPATIENT. Inpatient status is judged to be reasonable and necessary in order to provide the required intensity of service to ensure the patient's safety. The patient's presenting symptoms, physical exam findings, and initial radiographic and laboratory data in the context of their chronic comorbidities is felt to place them at high risk for further clinical deterioration. Furthermore, it is not anticipated that the patient will be medically stable for discharge from the hospital within 2 midnights of admission.   * I certify that at the point of admission it is my clinical judgment that the patient will require inpatient hospital care spanning beyond 2 midnights from the point of admission due to high intensity of service, high risk for further deterioration and high frequency of surveillance required.*  Author: Loyce Dys, MD 04/02/2023 8:58 AM  For on call review [email or web address].

## 2023-04-02 NOTE — ED Notes (Signed)
ED TO INPATIENT HANDOFF REPORT  ED Nurse Name and Phone #: [phone]  S Name/Age/Gender Donald [name] 37 y.o. male Room/Bed: 034C/034C  Code Status   Code Status: Full Code  Home/SNF/Other Home Patient oriented to: self, place, time, and situation Is this baseline? Yes   Triage Complete: Triage complete  Chief Complaint Acute deep vein thrombosis (DVT) of left upper extremity (HCC) [I82.622]  Triage Note PT arrives via POV. PT reports he has had a large hematoma on his left upper arm for about 1 month. Pt states he had an [place] earlier today and was told to come to the ED due to a blood clot. Imaging was done at [facility]. PT denies cp or sob. AxOx4. Pt reports he takes warfarin, denies missing any doses.    Allergies No Known Allergies  Level of Care/Admitting Diagnosis ED Disposition     ED Disposition  Admit   Condition  --   Comment  Hospital Area: [facility] [[id number]]  Level of Care: Telemetry Medical [104]  May admit patient to [facility] or [facility] if equivalent level of care is available:: No  Covid Evaluation: Asymptomatic - no recent exposure (last 10 days) testing not required  Diagnosis: Acute deep vein thrombosis (DVT) of left upper extremity [facility]) [[id number]]  Admitting Physician: Angie Fava [[id number]]  Attending Physician: Angie Fava [[id number]]  Certification:: I certify this patient will need inpatient services for at least 2 midnights  Expected Medical Readiness: 04/04/2023          B Medical/Surgery History Past Medical History:  Diagnosis Date   Anemia    Autoimmune disorder (HCC)    pyoderma gangrenosum   CHF (congestive heart failure) (HCC)    Chronic systolic heart failure (HCC)    a. EF 35-40% by echo in 07/2018 b. EF at 45% by repeat echo in 04/2020   DVT (deep venous thrombosis) (HCC)    h/o   Dysrhythmia    Mitral regurgitation    a. s/p MV repair with resection of ruptured anterior  papillary muscle and reconstruction of papillary chord and placement of annuloplasty ring in 2019. b. 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HCC)    arthritis   Shock, septic and cardiogenic 12/28/2021   Tricuspid regurgitation    Past Surgical History:  Procedure Laterality Date   CARDIAC CATHETERIZATION     HERNIA REPAIR Right 2018   MITRAL VALVE REPAIR  06/07/2018   [facility] - Dr Meda Klinefelter   MITRAL VALVE REPLACEMENT N/A 06/23/2021   MULTIPLE EXTRACTIONS WITH ALVEOLOPLASTY N/A 11/08/2020   Procedure: EXTRACTION OF TEETH NUMBER ONE, FIFTHTEEN, SIXTEEN, SEVENTEEN, EIGHTTEEN AND THRTY WITH ALVEOLOPLASTY OF LOWER LEFT QUADRANT.;  Surgeon: Sharman Cheek, DMD;  Location: [facility] OR;  Service: Dentistry;  Laterality: N/A;   RIGHT/LEFT HEART CATH AND CORONARY ANGIOGRAPHY N/A 09/11/2020   Procedure: RIGHT/LEFT HEART CATH AND CORONARY ANGIOGRAPHY;  Surgeon: Dolores Patty, MD;  Location: [facility] INVASIVE CV LAB;  Service: Cardiovascular;  Laterality: N/A;   TEE WITHOUT CARDIOVERSION N/A 05/23/2020   Procedure: TRANSESOPHAGEAL ECHOCARDIOGRAM (TEE) WITH PROPOFOL;  Surgeon: [name], MD;  Location: [facility] ENDO SUITE;  Service: Endoscopy;  Laterality: N/A;     A IV Location/Drains/Wounds Patient Lines/Drains/Airways Status     Active Line/Drains/Airways     Name Placement date Placement time  Site Days   Peripheral IV 04/02/23 20 G Anterior;Distal;Right;Upper Arm 04/02/23  0123  Arm  less than 1   Wound / Incision (Open or Dehisced) 07/24/21 Buttocks Bilateral hidradenitis suppurativa 07/24/21  1930  Buttocks  617   Wound / Incision (Open or Dehisced) 07/24/21 Non-pressure wound Thigh Anterior;Left;Right;Medial 07/24/21  1900  Thigh  617   Wound / Incision (Open or Dehisced) 12/30/21 Other (Comment) Buttocks Right;Left;Medial;Bilateral pyoderma gangrenosum  12/30/21  0300  Buttocks  458   Wound / Incision (Open or Dehisced) 12/30/21 Non-pressure wound;Other (Comment) Pretibial Left 12/30/21  0700  Pretibial  458            Intake/Output Last 24 hours No intake or output data in the 24 hours ending 04/02/23 0222  Labs/Imaging Results for orders placed or performed during the hospital encounter of 04/01/23 (from the past 48 hour(s))  CBC     Status: Abnormal   Collection Time: 04/01/23  5:16 PM  Result Value Ref Range   WBC 9.9 4.0 - 10.5 K/uL   RBC 3.94 (L) 4.22 - 5.81 MIL/uL   Hemoglobin 9.6 (L) 13.0 - 17.0 g/dL   HCT 41.3 (L) 24.4 - 01.0 %   MCV 78.4 (L) 80.0 - 100.0 fL   MCH 24.4 (L) 26.0 - 34.0 pg   MCHC 31.1 30.0 - 36.0 g/dL   RDW 27.2 (H) 53.6 - 64.4 %   Platelets 429 (H) 150 - 400 K/uL   nRBC 0.0 0.0 - 0.2 %    Comment: Performed at [facility], [address], [place], [place] [address]  Comprehensive metabolic panel     Status: Abnormal   Collection Time: 04/01/23  5:16 PM  Result Value Ref Range   Sodium 133 (L) 135 - 145 mmol/L   Potassium 3.5 3.5 - 5.1 mmol/L   Chloride 102 98 - 111 mmol/L   CO2 23 22 - 32 mmol/L   Glucose, Bld 91 70 - 99 mg/dL    Comment: Glucose reference range applies only to samples taken after fasting for at least 8 hours.   BUN 12 6 - 20 mg/dL   Creatinine, Ser 2.59 (H) 0.61 - 1.24 mg/dL   Calcium 8.3 (L) 8.9 - 10.3 mg/dL   Total Protein 9.7 (H) 6.5 - 8.1 g/dL   Albumin 2.1 (L) 3.5 - 5.0 g/dL   AST 25 15 - 41 U/L   ALT 11 0 - 44 U/L   Alkaline Phosphatase 105 38 - 126 U/L   Total Bilirubin 0.3 0.3 - 1.2 mg/dL   GFR, Estimated >56 >38 mL/min    Comment: (NOTE) Calculated using the CKD-EPI Creatinine Equation (2021)    Anion gap 8 5 - 15    Comment: Performed at [facility], [address] [place], [place] [address]  Protime-INR     Status: Abnormal   Collection Time: 04/01/23  5:16 PM  Result Value Ref Range   Prothrombin Time 42.9 (H) 11.4 - 15.2 seconds   INR 4.5  (HH) 0.8 - 1.2    Comment: CRITICAL RESULT CALLED TO, READ BACK BY AND VERIFIED WITH: I. EVANS, RN AT 1843 10.16.24 D. BLU (NOTE) INR goal varies based on device and disease states. Performed at [facility], [address], [place], [place] [address]    No results found.  Pending Labs [employer] (From admission, onward)     Start     Ordered   04/02/23 0500  Protime-INR  Daily,   R      04/02/23 0034   04/02/23 0500  CBC with Differential/Platelet  Tomorrow morning,   R        04/02/23 0115   04/02/23 0500  Comprehensive metabolic panel  Tomorrow morning,   R        04/02/23 0115   04/02/23 0500  Magnesium  Tomorrow morning,   R        04/02/23 0115            Vitals/Pain Today's Vitals   04/01/23 1715 04/01/23 2224 04/01/23 2345 04/02/23 0115  BP:  98/69 117/78 115/76  Pulse:  70 64 72  Resp:  18 20 14   Temp:  98.3 F (36.8 C)    TempSrc:      SpO2:  100% 100% 96%  Weight: 103.9 kg     Height: 5\' 9"  (1.753 m)     PainSc:        Isolation Precautions No active isolations  Medications Medications  acetaminophen (TYLENOL) tablet 650 mg (has no administration in time range)    Or  acetaminophen (TYLENOL) suppository 650 mg (has no administration in time range)  melatonin tablet 3 mg (has no administration in time range)  naloxone (NARCAN) injection 0.4 mg (has no administration in time range)  fentaNYL (SUBLIMAZE) injection 25 mcg (has no administration in time range)    Mobility walks     Focused Assessments Pt with left arm swelling and pain, no obvious deformities noted   R Recommendations: See Admitting Provider Note  Report given to:   Additional Notes: Pt a/o x 4, able to ambulate with no difficulty

## 2023-04-03 ENCOUNTER — Encounter (HOSPITAL_COMMUNITY): Payer: Self-pay | Admitting: Internal Medicine

## 2023-04-03 DIAGNOSIS — L732 Hidradenitis suppurativa: Secondary | ICD-10-CM

## 2023-04-03 LAB — BASIC METABOLIC PANEL
Anion gap: 9 (ref 5–15)
BUN: 9 mg/dL (ref 6–20)
CO2: 21 mmol/L — ABNORMAL LOW (ref 22–32)
Calcium: 8.3 mg/dL — ABNORMAL LOW (ref 8.9–10.3)
Chloride: 101 mmol/L (ref 98–111)
Creatinine, Ser: 1.19 mg/dL (ref 0.61–1.24)
GFR, Estimated: >60 mL/min (ref >60)
Glucose, Bld: 111 mg/dL — ABNORMAL HIGH (ref 70–99)
Potassium: 3.5 mmol/L (ref 3.5–5.1)
Sodium: 131 mmol/L — ABNORMAL LOW (ref 135–145)

## 2023-04-03 LAB — CBC
HCT: 31.4 % — ABNORMAL LOW (ref 39.0–52.0)
Hemoglobin: 10 g/dL — ABNORMAL LOW (ref 13.0–17.0)
MCH: 25.1 pg — ABNORMAL LOW (ref 26.0–34.0)
MCHC: 31.8 g/dL (ref 30.0–36.0)
MCV: 78.9 fL — ABNORMAL LOW (ref 80.0–100.0)
Platelets: 434 10*3/uL — ABNORMAL HIGH (ref 150–400)
RBC: 3.98 MIL/uL — ABNORMAL LOW (ref 4.22–5.81)
RDW: 17.6 % — ABNORMAL HIGH (ref 11.5–15.5)
WBC: 9.3 10*3/uL (ref 4.0–10.5)
nRBC: 0 % (ref 0.0–0.2)

## 2023-04-03 LAB — PROTIME-INR
INR: 4.2 (ref 0.8–1.2)
Prothrombin Time: 40.9 s — ABNORMAL HIGH (ref 11.4–15.2)

## 2023-04-03 LAB — MAGNESIUM: Magnesium: 2.2 mg/dL (ref 1.7–2.4)

## 2023-04-03 MED ORDER — DOXYCYCLINE HYCLATE 100 MG PO TABS: 100.0000 mg | ORAL_TABLET | Freq: Two times a day (BID) | ORAL | 0 refills | Status: AC

## 2023-04-03 MED ORDER — POTASSIUM CHLORIDE CRYS ER 20 MEQ PO TBCR
40.0000 meq | EXTENDED_RELEASE_TABLET | Freq: Once | ORAL | Status: AC
  Administered 2023-04-03: 40 meq via ORAL
  Filled 2023-04-03: qty 2

## 2023-04-03 MED ORDER — DOXYCYCLINE HYCLATE 100 MG PO TABS
100.0000 mg | ORAL_TABLET | Freq: Two times a day (BID) | ORAL | Status: DC
  Administered 2023-04-03: 100 mg via ORAL
  Filled 2023-04-03: qty 1

## 2023-04-03 NOTE — Consult Note (Signed)
Donald [name] 22-May-1986  [id number].    Requesting MD: Dr. Rosezetta Schlatter Chief Complaint/Reason for Consult: left axillary hidradenitis  HPI:  This is a 37 yo black male with a significant past medical history as outlined below.  He has a history of HS that he is followed by dermatology for and is actually on Cosentyx as well as topical clindamycin.  He actually sees [facility] dermatology on Nov 3 and his normal derm on Nov 11.  He presented to I believe [facility] with left arm pain and swelling.  He states this is not necessarily new and that he has chronic lymphedema secondary to a previous "hematoma that was compressing some nerves on this side that has been removed."  Since that time, he has limited function of this arm with chronic pain and edema.  He was thought to possibly have a DVT in his arm.  He left that ED and came down here for further evaluation "as nothing was done up there."  He was admitted here and a CT chest was completed that ruled out subclavian DVT.  He has been noted to have drainage from the left axilla secondary to his HS.  Some chronic skin changes and possible abscess were noted on his imaging.  We have been asked to see to rule out surgical intervention.  He states that the drainage he is having is at baseline along with pain and presence.  ROS: ROS: please see HPI  Family History  Problem Relation Age of Onset   Multiple sclerosis Mother    Psoriasis Mother    Depression Father    Diabetes Father    Diabetes Paternal Grandmother     Past Medical History:  Diagnosis Date   Anemia    Autoimmune disorder (HCC)    pyoderma gangrenosum   CHF (congestive heart failure) (HCC)    Chronic systolic heart failure (HCC)    a. EF 35-40% by echo in 07/2018 b. EF at 45% by repeat echo in 04/2020   DVT (deep venous thrombosis) (HCC)    h/o   Dysrhythmia    Hidradenitis suppurativa    Mitral regurgitation    a. s/p MV repair with resection of ruptured  anterior papillary muscle and reconstruction of papillary chord and placement of annuloplasty ring in 2019. b. 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HCC)    arthritis   Shock, septic and cardiogenic 12/28/2021   Tricuspid regurgitation     Past Surgical History:  Procedure Laterality Date   CARDIAC CATHETERIZATION     HERNIA REPAIR Right 2018   MITRAL VALVE REPAIR  06/07/2018   [facility] - Dr Meda Klinefelter   MITRAL VALVE REPLACEMENT N/A 06/23/2021   MULTIPLE EXTRACTIONS WITH ALVEOLOPLASTY N/A 11/08/2020   Procedure: EXTRACTION OF TEETH NUMBER ONE, FIFTHTEEN, SIXTEEN, SEVENTEEN, EIGHTTEEN AND THRTY WITH ALVEOLOPLASTY OF LOWER LEFT QUADRANT.;  Surgeon: Sharman Cheek, DMD;  Location: [facility] OR;  Service: Dentistry;  Laterality: N/A;   RIGHT/LEFT HEART CATH AND CORONARY ANGIOGRAPHY N/A 09/11/2020   Procedure: RIGHT/LEFT HEART CATH AND CORONARY ANGIOGRAPHY;  Surgeon: Dolores Patty, MD;  [facility]: [facility];  Service: Cardiovascular;  Laterality: N/A;   TEE WITHOUT CARDIOVERSION N/A 05/23/2020   Procedure: TRANSESOPHAGEAL ECHOCARDIOGRAM (TEE) WITH PROPOFOL;  Surgeon: [name], MD;  [facility]: [facility];  Service:  99 mg/dL    Comment: Glucose reference range applies only to samples taken after fasting for at least 8 hours.   BUN 9 6 - 20 mg/dL   Creatinine, Ser 1.61 0.61 - 1.24 mg/dL   Calcium 8.3 (L) 8.9 - 10.3 mg/dL   GFR, Estimated >09 >60 mL/min    Comment: (NOTE) Calculated using the CKD-EPI Creatinine Equation (2021)    Anion gap 9 5 - 15    Comment: Performed at [facility], [address] [place], [place] [address]  Magnesium     Status: None   Collection Time: 04/03/23  7:44 AM  Result Value Ref Range   Magnesium 2.2 1.7 - 2.4 mg/dL    Comment: Performed at [facility], [address] [place], [place] [address]   CT CHEST W CONTRAST  Result Date: 04/02/2023 CLINICAL DATA:  DVT on prior ultrasound EXAM: CT CHEST WITH CONTRAST TECHNIQUE: Multidetector CT imaging of the chest was performed during intravenous contrast administration. RADIATION DOSE REDUCTION: This exam was performed according to the departmental dose-optimization program which includes automated exposure control, adjustment of the mA and/or kV according to patient size and/or use of iterative reconstruction technique. CONTRAST:  50mL OMNIPAQUE IOHEXOL 350 MG/ML SOLN COMPARISON:  Venous duplex 04/01/2023 FINDINGS: Cardiovascular: Nonaneurysmal aorta. Mitral valve prosthesis. Cardiomegaly. No pericardial effusion. Imaged left subclavian vein appears patent on this exam allowing for suboptimal contrast bolus. Mediastinum/Nodes: Midline trachea. No thyroid mass. No suspicious lymph nodes. Esophagus within normal limits. Lungs/Pleura: No acute airspace disease, pleural effusion or pneumothorax. Upper Abdomen: No acute finding Musculoskeletal: Post sternotomy changes. No acute osseous abnormality. Skin thickening in the left axilla. Gas and fluid collection within the subcutaneous soft tissues of the anterior upper arm, this measures 4 by 2.5 cm on series 3, image 36 and 5.4 cm  craniocaudad on series 7, image 239. Smaller foci of gas within the inner upper arm, possibly intramuscular, for example series 3, image 42 and series 3, image 55. Small gas and fluid collection in the left axilla, series 3, image 21. Suspected left upper extremity muscle edema. No left-sided rib anomalies. IMPRESSION: 1. Described left subclavian DVT is not clearly identified on the current exam, subclavian vein appears grossly patent allowing for suboptimal bolus. There is no evidence for mediastinal mass lesion or left-sided rib anomaly. 2. Skin thickening in the left upper arm and axilla with multiple gas and fluid collections in the soft tissues, suspicious for abscess. Suspect that there is muscle edema as well, dedicated imaging of the left upper extremity is recommended. 3. Cardiomegaly Electronically Signed   By: Jasmine Pang M.D.   On: 04/02/2023 22:54   VAS [place] UPPER EXTREMITY VENOUS DUPLEX  Result Date: 04/02/2023 UPPER VENOUS STUDY  Patient Name:  Donald [name]  Date of Exam:   04/02/2023 Medical Rec #: [id number]            Accession #:    [id number] Date of Birth: 12/18/1985           Patient Gender: M Patient Age:   39 years Exam Location:  [facility] Procedure:      VAS [place] UPPER EXTREMITY VENOUS DUPLEX Referring Phys: Newton Pigg --------------------------------------------------------------------------------  Indications: recent dx of left subclavian dvt Anticoagulation: Heparin. Limitations: Poor ultrasound/tissue interface and open wound. Comparison Study: No prior studies. Performing Technologist: Chanda Busing RVT  Examination Guidelines: A complete evaluation includes B-mode imaging, spectral Doppler, color Doppler, and power Doppler as needed of  Donald [name] 22-May-1986  [id number].    Requesting MD: Dr. Rosezetta Schlatter Chief Complaint/Reason for Consult: left axillary hidradenitis  HPI:  This is a 37 yo black male with a significant past medical history as outlined below.  He has a history of HS that he is followed by dermatology for and is actually on Cosentyx as well as topical clindamycin.  He actually sees [facility] dermatology on Nov 3 and his normal derm on Nov 11.  He presented to I believe [facility] with left arm pain and swelling.  He states this is not necessarily new and that he has chronic lymphedema secondary to a previous "hematoma that was compressing some nerves on this side that has been removed."  Since that time, he has limited function of this arm with chronic pain and edema.  He was thought to possibly have a DVT in his arm.  He left that ED and came down here for further evaluation "as nothing was done up there."  He was admitted here and a CT chest was completed that ruled out subclavian DVT.  He has been noted to have drainage from the left axilla secondary to his HS.  Some chronic skin changes and possible abscess were noted on his imaging.  We have been asked to see to rule out surgical intervention.  He states that the drainage he is having is at baseline along with pain and presence.  ROS: ROS: please see HPI  Family History  Problem Relation Age of Onset   Multiple sclerosis Mother    Psoriasis Mother    Depression Father    Diabetes Father    Diabetes Paternal Grandmother     Past Medical History:  Diagnosis Date   Anemia    Autoimmune disorder (HCC)    pyoderma gangrenosum   CHF (congestive heart failure) (HCC)    Chronic systolic heart failure (HCC)    a. EF 35-40% by echo in 07/2018 b. EF at 45% by repeat echo in 04/2020   DVT (deep venous thrombosis) (HCC)    h/o   Dysrhythmia    Hidradenitis suppurativa    Mitral regurgitation    a. s/p MV repair with resection of ruptured  anterior papillary muscle and reconstruction of papillary chord and placement of annuloplasty ring in 2019. b. 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HCC)    arthritis   Shock, septic and cardiogenic 12/28/2021   Tricuspid regurgitation     Past Surgical History:  Procedure Laterality Date   CARDIAC CATHETERIZATION     HERNIA REPAIR Right 2018   MITRAL VALVE REPAIR  06/07/2018   [facility] - Dr Meda Klinefelter   MITRAL VALVE REPLACEMENT N/A 06/23/2021   MULTIPLE EXTRACTIONS WITH ALVEOLOPLASTY N/A 11/08/2020   Procedure: EXTRACTION OF TEETH NUMBER ONE, FIFTHTEEN, SIXTEEN, SEVENTEEN, EIGHTTEEN AND THRTY WITH ALVEOLOPLASTY OF LOWER LEFT QUADRANT.;  Surgeon: Sharman Cheek, DMD;  Location: [facility];  Service: Dentistry;  Laterality: N/A;   RIGHT/LEFT HEART CATH AND CORONARY ANGIOGRAPHY N/A 09/11/2020   Procedure: RIGHT/LEFT HEART CATH AND CORONARY ANGIOGRAPHY;  Surgeon: Dolores Patty, MD;  [facility];  Service: Cardiovascular;  Laterality: N/A;   TEE WITHOUT CARDIOVERSION N/A 05/23/2020   Procedure: TRANSESOPHAGEAL ECHOCARDIOGRAM (TEE) WITH PROPOFOL;  Surgeon: [name], MD;  [facility];  Service:  99 mg/dL    Comment: Glucose reference range applies only to samples taken after fasting for at least 8 hours.   BUN 9 6 - 20 mg/dL   Creatinine, Ser 1.61 0.61 - 1.24 mg/dL   Calcium 8.3 (L) 8.9 - 10.3 mg/dL   GFR, Estimated >09 >60 mL/min    Comment: (NOTE) Calculated using the CKD-EPI Creatinine Equation (2021)    Anion gap 9 5 - 15    Comment: Performed at [facility], [address] [place], [place] [address]  Magnesium     Status: None   Collection Time: 04/03/23  7:44 AM  Result Value Ref Range   Magnesium 2.2 1.7 - 2.4 mg/dL    Comment: Performed at [facility], [address] [place], [place] [address]   CT CHEST W CONTRAST  Result Date: 04/02/2023 CLINICAL DATA:  DVT on prior ultrasound EXAM: CT CHEST WITH CONTRAST TECHNIQUE: Multidetector CT imaging of the chest was performed during intravenous contrast administration. RADIATION DOSE REDUCTION: This exam was performed according to the departmental dose-optimization program which includes automated exposure control, adjustment of the mA and/or kV according to patient size and/or use of iterative reconstruction technique. CONTRAST:  50mL OMNIPAQUE IOHEXOL 350 MG/ML SOLN COMPARISON:  Venous duplex 04/01/2023 FINDINGS: Cardiovascular: Nonaneurysmal aorta. Mitral valve prosthesis. Cardiomegaly. No pericardial effusion. Imaged left subclavian vein appears patent on this exam allowing for suboptimal contrast bolus. Mediastinum/Nodes: Midline trachea. No thyroid mass. No suspicious lymph nodes. Esophagus within normal limits. Lungs/Pleura: No acute airspace disease, pleural effusion or pneumothorax. Upper Abdomen: No acute finding Musculoskeletal: Post sternotomy changes. No acute osseous abnormality. Skin thickening in the left axilla. Gas and fluid collection within the subcutaneous soft tissues of the anterior upper arm, this measures 4 by 2.5 cm on series 3, image 36 and 5.4 cm  craniocaudad on series 7, image 239. Smaller foci of gas within the inner upper arm, possibly intramuscular, for example series 3, image 42 and series 3, image 55. Small gas and fluid collection in the left axilla, series 3, image 21. Suspected left upper extremity muscle edema. No left-sided rib anomalies. IMPRESSION: 1. Described left subclavian DVT is not clearly identified on the current exam, subclavian vein appears grossly patent allowing for suboptimal bolus. There is no evidence for mediastinal mass lesion or left-sided rib anomaly. 2. Skin thickening in the left upper arm and axilla with multiple gas and fluid collections in the soft tissues, suspicious for abscess. Suspect that there is muscle edema as well, dedicated imaging of the left upper extremity is recommended. 3. Cardiomegaly Electronically Signed   By: Jasmine Pang M.D.   On: 04/02/2023 22:54   VAS [place] UPPER EXTREMITY VENOUS DUPLEX  Result Date: 04/02/2023 UPPER VENOUS STUDY  Patient Name:  Donald [name]  Date of Exam:   04/02/2023 Medical Rec #: [id number]            Accession #:    [id number] Date of Birth: 12/18/1985           Patient Gender: M Patient Age:   39 years Exam Location:  [facility] Procedure:      VAS [place] UPPER EXTREMITY VENOUS DUPLEX Referring Phys: Newton Pigg --------------------------------------------------------------------------------  Indications: recent dx of left subclavian dvt Anticoagulation: Heparin. Limitations: Poor ultrasound/tissue interface and open wound. Comparison Study: No prior studies. Performing Technologist: Chanda Busing RVT  Examination Guidelines: A complete evaluation includes B-mode imaging, spectral Doppler, color Doppler, and power Doppler as needed of  Donald [name] 22-May-1986  [id number].    Requesting MD: Dr. Rosezetta Schlatter Chief Complaint/Reason for Consult: left axillary hidradenitis  HPI:  This is a 37 yo black male with a significant past medical history as outlined below.  He has a history of HS that he is followed by dermatology for and is actually on Cosentyx as well as topical clindamycin.  He actually sees [facility] dermatology on Nov 3 and his normal derm on Nov 11.  He presented to I believe [facility] with left arm pain and swelling.  He states this is not necessarily new and that he has chronic lymphedema secondary to a previous "hematoma that was compressing some nerves on this side that has been removed."  Since that time, he has limited function of this arm with chronic pain and edema.  He was thought to possibly have a DVT in his arm.  He left that ED and came down here for further evaluation "as nothing was done up there."  He was admitted here and a CT chest was completed that ruled out subclavian DVT.  He has been noted to have drainage from the left axilla secondary to his HS.  Some chronic skin changes and possible abscess were noted on his imaging.  We have been asked to see to rule out surgical intervention.  He states that the drainage he is having is at baseline along with pain and presence.  ROS: ROS: please see HPI  Family History  Problem Relation Age of Onset   Multiple sclerosis Mother    Psoriasis Mother    Depression Father    Diabetes Father    Diabetes Paternal Grandmother     Past Medical History:  Diagnosis Date   Anemia    Autoimmune disorder (HCC)    pyoderma gangrenosum   CHF (congestive heart failure) (HCC)    Chronic systolic heart failure (HCC)    a. EF 35-40% by echo in 07/2018 b. EF at 45% by repeat echo in 04/2020   DVT (deep venous thrombosis) (HCC)    h/o   Dysrhythmia    Hidradenitis suppurativa    Mitral regurgitation    a. s/p MV repair with resection of ruptured  anterior papillary muscle and reconstruction of papillary chord and placement of annuloplasty ring in 2019. b. 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HCC)    arthritis   Shock, septic and cardiogenic 12/28/2021   Tricuspid regurgitation     Past Surgical History:  Procedure Laterality Date   CARDIAC CATHETERIZATION     HERNIA REPAIR Right 2018   MITRAL VALVE REPAIR  06/07/2018   [facility] - Dr Meda Klinefelter   MITRAL VALVE REPLACEMENT N/A 06/23/2021   MULTIPLE EXTRACTIONS WITH ALVEOLOPLASTY N/A 11/08/2020   Procedure: EXTRACTION OF TEETH NUMBER ONE, FIFTHTEEN, SIXTEEN, SEVENTEEN, EIGHTTEEN AND THRTY WITH ALVEOLOPLASTY OF LOWER LEFT QUADRANT.;  Surgeon: Sharman Cheek, DMD;  Location: [facility];  Service: Dentistry;  Laterality: N/A;   RIGHT/LEFT HEART CATH AND CORONARY ANGIOGRAPHY N/A 09/11/2020   Procedure: RIGHT/LEFT HEART CATH AND CORONARY ANGIOGRAPHY;  Surgeon: Dolores Patty, MD;  [facility]: [facility];  Service: Cardiovascular;  Laterality: N/A;   TEE WITHOUT CARDIOVERSION N/A 05/23/2020   Procedure: TRANSESOPHAGEAL ECHOCARDIOGRAM (TEE) WITH PROPOFOL;  Surgeon: [name], MD;  Location: [facility];  Service:  99 mg/dL    Comment: Glucose reference range applies only to samples taken after fasting for at least 8 hours.   BUN 9 6 - 20 mg/dL   Creatinine, Ser 1.61 0.61 - 1.24 mg/dL   Calcium 8.3 (L) 8.9 - 10.3 mg/dL   GFR, Estimated >09 >60 mL/min    Comment: (NOTE) Calculated using the CKD-EPI Creatinine Equation (2021)    Anion gap 9 5 - 15    Comment: Performed at [facility], [address] [place], [place] [address]  Magnesium     Status: None   Collection Time: 04/03/23  7:44 AM  Result Value Ref Range   Magnesium 2.2 1.7 - 2.4 mg/dL    Comment: Performed at [facility], [address] [place], [place] [address]   CT CHEST W CONTRAST  Result Date: 04/02/2023 CLINICAL DATA:  DVT on prior ultrasound EXAM: CT CHEST WITH CONTRAST TECHNIQUE: Multidetector CT imaging of the chest was performed during intravenous contrast administration. RADIATION DOSE REDUCTION: This exam was performed according to the departmental dose-optimization program which includes automated exposure control, adjustment of the mA and/or kV according to patient size and/or use of iterative reconstruction technique. CONTRAST:  50mL OMNIPAQUE IOHEXOL 350 MG/ML SOLN COMPARISON:  Venous duplex 04/01/2023 FINDINGS: Cardiovascular: Nonaneurysmal aorta. Mitral valve prosthesis. Cardiomegaly. No pericardial effusion. Imaged left subclavian vein appears patent on this exam allowing for suboptimal contrast bolus. Mediastinum/Nodes: Midline trachea. No thyroid mass. No suspicious lymph nodes. Esophagus within normal limits. Lungs/Pleura: No acute airspace disease, pleural effusion or pneumothorax. Upper Abdomen: No acute finding Musculoskeletal: Post sternotomy changes. No acute osseous abnormality. Skin thickening in the left axilla. Gas and fluid collection within the subcutaneous soft tissues of the anterior upper arm, this measures 4 by 2.5 cm on series 3, image 36 and 5.4 cm  craniocaudad on series 7, image 239. Smaller foci of gas within the inner upper arm, possibly intramuscular, for example series 3, image 42 and series 3, image 55. Small gas and fluid collection in the left axilla, series 3, image 21. Suspected left upper extremity muscle edema. No left-sided rib anomalies. IMPRESSION: 1. Described left subclavian DVT is not clearly identified on the current exam, subclavian vein appears grossly patent allowing for suboptimal bolus. There is no evidence for mediastinal mass lesion or left-sided rib anomaly. 2. Skin thickening in the left upper arm and axilla with multiple gas and fluid collections in the soft tissues, suspicious for abscess. Suspect that there is muscle edema as well, dedicated imaging of the left upper extremity is recommended. 3. Cardiomegaly Electronically Signed   By: Jasmine Pang M.D.   On: 04/02/2023 22:54   VAS [place] UPPER EXTREMITY VENOUS DUPLEX  Result Date: 04/02/2023 UPPER VENOUS STUDY  Patient Name:  Donald [name]  Date of Exam:   04/02/2023 Medical Rec #: [id number]            Accession #:    [id number] Date of Birth: 12/18/1985           Patient Gender: M Patient Age:   39 years Exam Location:  [facility] Procedure:      VAS [place] UPPER EXTREMITY VENOUS DUPLEX Referring Phys: Newton Pigg --------------------------------------------------------------------------------  Indications: recent dx of left subclavian dvt Anticoagulation: Heparin. Limitations: Poor ultrasound/tissue interface and open wound. Comparison Study: No prior studies. Performing Technologist: Chanda Busing RVT  Examination Guidelines: A complete evaluation includes B-mode imaging, spectral Doppler, color Doppler, and power Doppler as needed of  Donald [name] 22-May-1986  [id number].    Requesting MD: Dr. Rosezetta Schlatter Chief Complaint/Reason for Consult: left axillary hidradenitis  HPI:  This is a 37 yo black male with a significant past medical history as outlined below.  He has a history of HS that he is followed by dermatology for and is actually on Cosentyx as well as topical clindamycin.  He actually sees [facility] dermatology on Nov 3 and his normal derm on Nov 11.  He presented to I believe [facility] with left arm pain and swelling.  He states this is not necessarily new and that he has chronic lymphedema secondary to a previous "hematoma that was compressing some nerves on this side that has been removed."  Since that time, he has limited function of this arm with chronic pain and edema.  He was thought to possibly have a DVT in his arm.  He left that ED and came down here for further evaluation "as nothing was done up there."  He was admitted here and a CT chest was completed that ruled out subclavian DVT.  He has been noted to have drainage from the left axilla secondary to his HS.  Some chronic skin changes and possible abscess were noted on his imaging.  We have been asked to see to rule out surgical intervention.  He states that the drainage he is having is at baseline along with pain and presence.  ROS: ROS: please see HPI  Family History  Problem Relation Age of Onset   Multiple sclerosis Mother    Psoriasis Mother    Depression Father    Diabetes Father    Diabetes Paternal Grandmother     Past Medical History:  Diagnosis Date   Anemia    Autoimmune disorder (HCC)    pyoderma gangrenosum   CHF (congestive heart failure) (HCC)    Chronic systolic heart failure (HCC)    a. EF 35-40% by echo in 07/2018 b. EF at 45% by repeat echo in 04/2020   DVT (deep venous thrombosis) (HCC)    h/o   Dysrhythmia    Hidradenitis suppurativa    Mitral regurgitation    a. s/p MV repair with resection of ruptured  anterior papillary muscle and reconstruction of papillary chord and placement of annuloplasty ring in 2019. b. 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HCC)    arthritis   Shock, septic and cardiogenic 12/28/2021   Tricuspid regurgitation     Past Surgical History:  Procedure Laterality Date   CARDIAC CATHETERIZATION     HERNIA REPAIR Right 2018   MITRAL VALVE REPAIR  06/07/2018   [facility] - Dr Meda Klinefelter   MITRAL VALVE REPLACEMENT N/A 06/23/2021   MULTIPLE EXTRACTIONS WITH ALVEOLOPLASTY N/A 11/08/2020   Procedure: EXTRACTION OF TEETH NUMBER ONE, FIFTHTEEN, SIXTEEN, SEVENTEEN, EIGHTTEEN AND THRTY WITH ALVEOLOPLASTY OF LOWER LEFT QUADRANT.;  Surgeon: Sharman Cheek, DMD;  Location: [facility];  Service: Dentistry;  Laterality: N/A;   RIGHT/LEFT HEART CATH AND CORONARY ANGIOGRAPHY N/A 09/11/2020   Procedure: RIGHT/LEFT HEART CATH AND CORONARY ANGIOGRAPHY;  Surgeon: Dolores Patty, MD;  [facility]: [facility];  Service: Cardiovascular;  Laterality: N/A;   TEE WITHOUT CARDIOVERSION N/A 05/23/2020   Procedure: TRANSESOPHAGEAL ECHOCARDIOGRAM (TEE) WITH PROPOFOL;  Surgeon: [name], MD;  Location: [facility];  Service:

## 2023-04-03 NOTE — Progress Notes (Signed)
Patient verbalized understanding of dc instructions. All belongings given to patient.

## 2023-04-03 NOTE — Progress Notes (Signed)
Patient ID: Donald [name], male   DOB: Apr 04, 1986, 37 y.o.   MRN: [id number]  Patient seen on morning rounds.  CT scan results reviewed with him in detail.  Thankfully there is no evidence of subclavian vein thrombosis.  Recommend continued anticoagulation with Coumadin.  No follow-up necessary with vascular surgery.  Please call with any questions.  Rande Brunt. Lenell Antu, MD [facility] Phone Number: [phone] 04/03/2023 1:29 PM\

## 2023-04-03 NOTE — Progress Notes (Addendum)
PHARMACY - ANTICOAGULATION CONSULT NOTE  Pharmacy Consult for heparin Indication: mechanical MVR + AFib  No Known Allergies  Patient Measurements: Height: 5\' 9"  (175.3 cm) Weight: 103.9 kg (229 lb) IBW/kg (Calculated) : 70.7 Heparin Dosing Weight: 93 kg  Vital Signs: Temp: 98.1 F (36.7 C) (10/18 1211) Temp Source: Oral (10/18 1211) BP: 102/66 (10/18 1211) Pulse Rate: 66 (10/18 1211)  Labs:  Estimated Creatinine Clearance: 103.9 mL/min (by C-G formula based on SCr of 1.19 mg/dL).  Medical History: Past Medical History:  Diagnosis Date   Anemia    Autoimmune disorder (HCC)    pyoderma gangrenosum   CHF (congestive heart failure) (HCC)    Chronic systolic heart failure (HCC)    a. EF 35-40% by echo in 07/2018 b. EF at 45% by repeat echo in 04/2020   DVT (deep venous thrombosis) (HCC)    h/o   Dysrhythmia    Hidradenitis suppurativa    Mitral regurgitation    a. 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 b. 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HCC)    arthritis   Shock, septic and cardiogenic 12/28/2021   Tricuspid regurgitation     Assessment: 37 YO male with medical history significant for mMVR and AFib on warfarin PTA who is admitted for arm swelling suspected due to left subclavian vein thrombosis . Pharmacy to dose IV heparin.   Follows with [facility] for [facility] management, PTA warfarin regimen was > 5 mg (5 mg x 1) every Mon, Wed, Fri; 7.5 mg (5 mg x 1.5) all other days  10/18 AM: Repeat INR today down at 4.2. Will continue to hold heparin until INR is below 2.5.   Goal of Therapy:  Heparin level 0.3-0.7 units/ml Monitor platelets by anticoagulation protocol: Yes   Plan:  Continue to hold heparin until INR < 2.5 Continue to  monitor INR, H&H and platelets  For discharge planning, can consider holding warfarin 10/18-10/19 and resuming home regimen 10/20 with early follow-up @ outpatient [facility] clinic next week   Thank you for allowing pharmacy to be a part of this patient's care.  Thelma Barge, PharmD Clinical Pharmacist

## 2023-04-03 NOTE — Plan of Care (Signed)

## 2023-04-03 NOTE — Discharge Summary (Signed)
Physician Discharge Summary   Patient: Donald [name] MRN: [id number] DOB: 07-13-1985  Admit date:     04/01/2023  Discharge date: 04/03/23  Discharge Physician: Loyce Dys   PCP: Shelby Dubin, FNP   Recommendations at discharge:  Follow-up with your Primary care physician as well as cardiology  Discharge Diagnoses: Left upper extremity swelling and pain  Imaging have ruled out subclavian DVT Supratherapeutic INR Hypokalemia Low magnesium  Valvular heart disease currently on warfarin chronic systolic congestive heart failure,  chronic anemia,  History of atrial flutter Hidradenitis suppurativa     Hospital Course: Donald [name] is a 36 y.o. male with medical history significant of chronic systolic congestive heart failure, chronic anemia, autoimmune disease, pyoderma gangrenosum, dysrhythmia, atrial flutter, hidradenitis suppurativa, patient follows up with outpatient neurologist, dermatologist.  According to him he has been having swelling of the left upper extremity that has been going on for the last 1 month.  He was admitted last year after he developed hematoma in the left that he underwent evacuation and according to him since last year he has been having weakness involving the left for which he has been following up with neurologist.  He followed up with neurologist a month ago and according to him he was found to have swelling of the left arm so the neurologist referred him to see his primary care physician.  Patient was scheduled for an outpatient ultrasound and he was called with the findings of subclavian DVT and was informed to come to the emergency room for further management.  Patient underwent ultrasound of the left upper extremity on admission that did not show any DVT.  Vascular surgeon subsequently obtained CT scan that did not show any DVT.  But rather some concerns of fluid collection for which I consulted general surgeon.  Patient and deemed this to be  chronic.patient has therefore been cleared for discharge today and to follow-up with Coumadin clinic, cardiology as well as his primary care physician.  Patient has been encouraged to not take warfarin today and tomorrow and to resume on 04/05/2023.  Patient has an appointment at the Coumadin clinic on 10/21.  Consultants: General surgery, vascular surgery Procedures performed: None Disposition: Home Diet recommendation:  Cardiac diet DISCHARGE MEDICATION: Allergies as of 04/03/2023   No Known Allergies      Medication List     TAKE these medications    acetaminophen 325 MG tablet Commonly known as: TYLENOL Take 2 tablets (650 mg total) by mouth every 4 (four) hours as needed for headache or mild pain.   amiodarone 200 MG tablet Commonly known as: PACERONE Take 1 tablet by mouth once daily   Cosentyx (300 MG Dose) 150 MG/ML Sosy Generic drug: Secukinumab (300 MG Dose) Inject 150 mg into the skin every 14 (fourteen) days.  every 4 weeks   digoxin 0.125 MG tablet Commonly known as: LANOXIN Take 1 tablet (0.125 mg total) by mouth daily.   doxycycline 100 MG tablet Commonly known as: VIBRA-TABS Take 1 tablet (100 mg total) by mouth 2 (two) times daily for 21 days.   Farxiga 10 MG Tabs tablet Generic drug: dapagliflozin propanediol Take 1 tablet by mouth once daily   Ferrous Sulfate 50 MG Tbcr Take 50 mg by mouth daily.   furosemide 40 MG tablet Commonly known as: LASIX Take 40 mg by mouth daily.   gabapentin 300 MG capsule Commonly known as: NEURONTIN Take 2 capsules (600 mg total) by mouth 2 (two) times daily.  imaging of the left upper extremity is recommended. 3. Cardiomegaly Electronically Signed   By: Jasmine Pang M.D.   On: 04/02/2023 22:54   VAS [place] UPPER EXTREMITY VENOUS DUPLEX  Result Date: 04/02/2023 UPPER VENOUS STUDY  Patient Name:  Donald [name]  Date of Exam:   04/02/2023 Medical Rec #: [id number]            Accession #:    [id number] Date of Birth: Oct 16, 1985           Patient Gender: M Patient Age:   73 years Exam  Location:  [facility] Procedure:      VAS [place] UPPER EXTREMITY VENOUS DUPLEX Referring Phys: Newton Pigg --------------------------------------------------------------------------------  Indications: recent dx of left subclavian dvt Anticoagulation: Heparin. Limitations: Poor ultrasound/tissue interface and open wound. Comparison Study: No prior studies. Performing Technologist: Chanda Busing RVT  Examination Guidelines: A complete evaluation includes B-mode imaging, spectral Doppler, color Doppler, and power Doppler as needed of all accessible portions of each vessel. Bilateral testing is considered an integral part of a complete examination. Limited examinations for reoccurring indications may be performed as noted.  Right Findings: +----------+------------+---------+-----------+----------+-------+ RIGHT     CompressiblePhasicitySpontaneousPropertiesSummary +----------+------------+---------+-----------+----------+-------+ Subclavian    Full       Yes       Yes                      +----------+------------+---------+-----------+----------+-------+  Left Findings: +----------+------------+---------+-----------+----------+-------+ LEFT      CompressiblePhasicitySpontaneousPropertiesSummary +----------+------------+---------+-----------+----------+-------+ IJV           Full       Yes       Yes                      +----------+------------+---------+-----------+----------+-------+ Subclavian    Full       Yes       Yes                      +----------+------------+---------+-----------+----------+-------+ Axillary      Full       Yes       Yes                      +----------+------------+---------+-----------+----------+-------+ Brachial      Full       Yes       Yes                      +----------+------------+---------+-----------+----------+-------+ Radial        Full                                           +----------+------------+---------+-----------+----------+-------+ Ulnar         Full                                          +----------+------------+---------+-----------+----------+-------+ Cephalic      Full                                          +----------+------------+---------+-----------+----------+-------+ Basilic       Full                                          +----------+------------+---------+-----------+----------+-------+  imaging of the left upper extremity is recommended. 3. Cardiomegaly Electronically Signed   By: Jasmine Pang M.D.   On: 04/02/2023 22:54   VAS [place] UPPER EXTREMITY VENOUS DUPLEX  Result Date: 04/02/2023 UPPER VENOUS STUDY  Patient Name:  Donald [name]  Date of Exam:   04/02/2023 Medical Rec #: [id number]            Accession #:    [id number] Date of Birth: Oct 16, 1985           Patient Gender: M Patient Age:   73 years Exam  Location:  [facility] Procedure:      VAS [place] UPPER EXTREMITY VENOUS DUPLEX Referring Phys: Newton Pigg --------------------------------------------------------------------------------  Indications: recent dx of left subclavian dvt Anticoagulation: Heparin. Limitations: Poor ultrasound/tissue interface and open wound. Comparison Study: No prior studies. Performing Technologist: Chanda Busing RVT  Examination Guidelines: A complete evaluation includes B-mode imaging, spectral Doppler, color Doppler, and power Doppler as needed of all accessible portions of each vessel. Bilateral testing is considered an integral part of a complete examination. Limited examinations for reoccurring indications may be performed as noted.  Right Findings: +----------+------------+---------+-----------+----------+-------+ RIGHT     CompressiblePhasicitySpontaneousPropertiesSummary +----------+------------+---------+-----------+----------+-------+ Subclavian    Full       Yes       Yes                      +----------+------------+---------+-----------+----------+-------+  Left Findings: +----------+------------+---------+-----------+----------+-------+ LEFT      CompressiblePhasicitySpontaneousPropertiesSummary +----------+------------+---------+-----------+----------+-------+ IJV           Full       Yes       Yes                      +----------+------------+---------+-----------+----------+-------+ Subclavian    Full       Yes       Yes                      +----------+------------+---------+-----------+----------+-------+ Axillary      Full       Yes       Yes                      +----------+------------+---------+-----------+----------+-------+ Brachial      Full       Yes       Yes                      +----------+------------+---------+-----------+----------+-------+ Radial        Full                                           +----------+------------+---------+-----------+----------+-------+ Ulnar         Full                                          +----------+------------+---------+-----------+----------+-------+ Cephalic      Full                                          +----------+------------+---------+-----------+----------+-------+ Basilic       Full                                          +----------+------------+---------+-----------+----------+-------+  imaging of the left upper extremity is recommended. 3. Cardiomegaly Electronically Signed   By: Jasmine Pang M.D.   On: 04/02/2023 22:54   VAS [place] UPPER EXTREMITY VENOUS DUPLEX  Result Date: 04/02/2023 UPPER VENOUS STUDY  Patient Name:  Donald [name]  Date of Exam:   04/02/2023 Medical Rec #: [id number]            Accession #:    [id number] Date of Birth: Oct 16, 1985           Patient Gender: M Patient Age:   73 years Exam  Location:  [facility] Procedure:      VAS [place] UPPER EXTREMITY VENOUS DUPLEX Referring Phys: Newton Pigg --------------------------------------------------------------------------------  Indications: recent dx of left subclavian dvt Anticoagulation: Heparin. Limitations: Poor ultrasound/tissue interface and open wound. Comparison Study: No prior studies. Performing Technologist: Chanda Busing RVT  Examination Guidelines: A complete evaluation includes B-mode imaging, spectral Doppler, color Doppler, and power Doppler as needed of all accessible portions of each vessel. Bilateral testing is considered an integral part of a complete examination. Limited examinations for reoccurring indications may be performed as noted.  Right Findings: +----------+------------+---------+-----------+----------+-------+ RIGHT     CompressiblePhasicitySpontaneousPropertiesSummary +----------+------------+---------+-----------+----------+-------+ Subclavian    Full       Yes       Yes                      +----------+------------+---------+-----------+----------+-------+  Left Findings: +----------+------------+---------+-----------+----------+-------+ LEFT      CompressiblePhasicitySpontaneousPropertiesSummary +----------+------------+---------+-----------+----------+-------+ IJV           Full       Yes       Yes                      +----------+------------+---------+-----------+----------+-------+ Subclavian    Full       Yes       Yes                      +----------+------------+---------+-----------+----------+-------+ Axillary      Full       Yes       Yes                      +----------+------------+---------+-----------+----------+-------+ Brachial      Full       Yes       Yes                      +----------+------------+---------+-----------+----------+-------+ Radial        Full                                           +----------+------------+---------+-----------+----------+-------+ Ulnar         Full                                          +----------+------------+---------+-----------+----------+-------+ Cephalic      Full                                          +----------+------------+---------+-----------+----------+-------+ Basilic       Full                                          +----------+------------+---------+-----------+----------+-------+  imaging of the left upper extremity is recommended. 3. Cardiomegaly Electronically Signed   By: Jasmine Pang M.D.   On: 04/02/2023 22:54   VAS [place] UPPER EXTREMITY VENOUS DUPLEX  Result Date: 04/02/2023 UPPER VENOUS STUDY  Patient Name:  Donald [name]  Date of Exam:   04/02/2023 Medical Rec #: [id number]            Accession #:    [id number] Date of Birth: Oct 16, 1985           Patient Gender: M Patient Age:   73 years Exam  Location:  [facility] Procedure:      VAS [place] UPPER EXTREMITY VENOUS DUPLEX Referring Phys: Newton Pigg --------------------------------------------------------------------------------  Indications: recent dx of left subclavian dvt Anticoagulation: Heparin. Limitations: Poor ultrasound/tissue interface and open wound. Comparison Study: No prior studies. Performing Technologist: Chanda Busing RVT  Examination Guidelines: A complete evaluation includes B-mode imaging, spectral Doppler, color Doppler, and power Doppler as needed of all accessible portions of each vessel. Bilateral testing is considered an integral part of a complete examination. Limited examinations for reoccurring indications may be performed as noted.  Right Findings: +----------+------------+---------+-----------+----------+-------+ RIGHT     CompressiblePhasicitySpontaneousPropertiesSummary +----------+------------+---------+-----------+----------+-------+ Subclavian    Full       Yes       Yes                      +----------+------------+---------+-----------+----------+-------+  Left Findings: +----------+------------+---------+-----------+----------+-------+ LEFT      CompressiblePhasicitySpontaneousPropertiesSummary +----------+------------+---------+-----------+----------+-------+ IJV           Full       Yes       Yes                      +----------+------------+---------+-----------+----------+-------+ Subclavian    Full       Yes       Yes                      +----------+------------+---------+-----------+----------+-------+ Axillary      Full       Yes       Yes                      +----------+------------+---------+-----------+----------+-------+ Brachial      Full       Yes       Yes                      +----------+------------+---------+-----------+----------+-------+ Radial        Full                                           +----------+------------+---------+-----------+----------+-------+ Ulnar         Full                                          +----------+------------+---------+-----------+----------+-------+ Cephalic      Full                                          +----------+------------+---------+-----------+----------+-------+ Basilic       Full                                          +----------+------------+---------+-----------+----------+-------+  Physician Discharge Summary   Patient: Donald [name] MRN: [id number] DOB: 07-13-1985  Admit date:     04/01/2023  Discharge date: 04/03/23  Discharge Physician: Loyce Dys   PCP: Shelby Dubin, FNP   Recommendations at discharge:  Follow-up with your Primary care physician as well as cardiology  Discharge Diagnoses: Left upper extremity swelling and pain  Imaging have ruled out subclavian DVT Supratherapeutic INR Hypokalemia Low magnesium  Valvular heart disease currently on warfarin chronic systolic congestive heart failure,  chronic anemia,  History of atrial flutter Hidradenitis suppurativa     Hospital Course: Donald [name] is a 36 y.o. male with medical history significant of chronic systolic congestive heart failure, chronic anemia, autoimmune disease, pyoderma gangrenosum, dysrhythmia, atrial flutter, hidradenitis suppurativa, patient follows up with outpatient neurologist, dermatologist.  According to him he has been having swelling of the left upper extremity that has been going on for the last 1 month.  He was admitted last year after he developed hematoma in the left that he underwent evacuation and according to him since last year he has been having weakness involving the left for which he has been following up with neurologist.  He followed up with neurologist a month ago and according to him he was found to have swelling of the left arm so the neurologist referred him to see his primary care physician.  Patient was scheduled for an outpatient ultrasound and he was called with the findings of subclavian DVT and was informed to come to the emergency room for further management.  Patient underwent ultrasound of the left upper extremity on admission that did not show any DVT.  Vascular surgeon subsequently obtained CT scan that did not show any DVT.  But rather some concerns of fluid collection for which I consulted general surgeon.  Patient and deemed this to be  chronic.patient has therefore been cleared for discharge today and to follow-up with Coumadin clinic, cardiology as well as his primary care physician.  Patient has been encouraged to not take warfarin today and tomorrow and to resume on 04/05/2023.  Patient has an appointment at the Coumadin clinic on 10/21.  Consultants: General surgery, vascular surgery Procedures performed: None Disposition: Home Diet recommendation:  Cardiac diet DISCHARGE MEDICATION: Allergies as of 04/03/2023   No Known Allergies      Medication List     TAKE these medications    acetaminophen 325 MG tablet Commonly known as: TYLENOL Take 2 tablets (650 mg total) by mouth every 4 (four) hours as needed for headache or mild pain.   amiodarone 200 MG tablet Commonly known as: PACERONE Take 1 tablet by mouth once daily   Cosentyx (300 MG Dose) 150 MG/ML Sosy Generic drug: Secukinumab (300 MG Dose) Inject 150 mg into the skin every 14 (fourteen) days.  every 4 weeks   digoxin 0.125 MG tablet Commonly known as: LANOXIN Take 1 tablet (0.125 mg total) by mouth daily.   doxycycline 100 MG tablet Commonly known as: VIBRA-TABS Take 1 tablet (100 mg total) by mouth 2 (two) times daily for 21 days.   Farxiga 10 MG Tabs tablet Generic drug: dapagliflozin propanediol Take 1 tablet by mouth once daily   Ferrous Sulfate 50 MG Tbcr Take 50 mg by mouth daily.   furosemide 40 MG tablet Commonly known as: LASIX Take 40 mg by mouth daily.   gabapentin 300 MG capsule Commonly known as: NEURONTIN Take 2 capsules (600 mg total) by mouth 2 (two) times daily.

## 2023-04-03 NOTE — Plan of Care (Signed)
[facility] Tonsillectomy/Adenoidectomy, Postoperative PEDS care plan entered in error.

## 2023-04-06 ENCOUNTER — Ambulatory Visit: Payer: Medicaid Other | Attending: Internal Medicine | Admitting: *Deleted

## 2023-04-06 DIAGNOSIS — Z952 Presence of prosthetic heart valve: Secondary | ICD-10-CM | POA: Diagnosis not present

## 2023-04-06 DIAGNOSIS — Z5181 Encounter for therapeutic drug level monitoring: Secondary | ICD-10-CM

## 2023-04-06 LAB — POCT INR: INR: 3.2 — AB (ref 2.0–3.0)

## 2023-04-06 NOTE — Patient Instructions (Signed)
Decrease warfarin to 1 tablet daily except 1 1/2 tablets on Tuesdays and Fridays Still taking Doxycycline x 3 more weeks (appx 04/24/23) Continue greens till finished Abx Recheck in 1 week Started minocycline 100mg  twice daily 07/23/22.  Can increase INR

## 2023-04-13 ENCOUNTER — Ambulatory Visit: Payer: Medicaid Other | Attending: Internal Medicine

## 2023-04-15 ENCOUNTER — Ambulatory Visit: Payer: Medicaid Other | Attending: Internal Medicine | Admitting: *Deleted

## 2023-04-15 DIAGNOSIS — Z5181 Encounter for therapeutic drug level monitoring: Secondary | ICD-10-CM

## 2023-04-15 DIAGNOSIS — Z952 Presence of prosthetic heart valve: Secondary | ICD-10-CM | POA: Diagnosis not present

## 2023-04-15 LAB — POCT INR: INR: 8 — AB (ref 2.0–3.0)

## 2023-04-16 DIAGNOSIS — R0602 Shortness of breath: Secondary | ICD-10-CM | POA: Diagnosis not present

## 2023-04-16 NOTE — Patient Instructions (Signed)
POC INR >8.0  Sent to [facility] for STAT PT/INR.  Pt can not go until in the morning due to child care.  He will hold warfarin until further notice.   10/31   INR at [facility] is 8.3. Spoke with patient.  Hold warfarin and come for INR check on 04/20/23  Still taking Doxycycline x 2 more weeks (appx 04/24/23) Continue greens till finished Abx Recheck  Started minocycline 100mg  twice daily 07/23/22.  Can increase INR Pt denies S/S of bleeding or excessive bruising.  Bleeding and fall precautions discussed with pt and he verbalized understanding.

## 2023-04-20 ENCOUNTER — Ambulatory Visit: Payer: Medicaid Other | Attending: Internal Medicine | Admitting: *Deleted

## 2023-04-20 DIAGNOSIS — L720 Epidermal cyst: Secondary | ICD-10-CM | POA: Diagnosis not present

## 2023-04-20 DIAGNOSIS — Z5181 Encounter for therapeutic drug level monitoring: Secondary | ICD-10-CM | POA: Diagnosis not present

## 2023-04-20 DIAGNOSIS — B354 Tinea corporis: Secondary | ICD-10-CM | POA: Diagnosis not present

## 2023-04-20 DIAGNOSIS — Z952 Presence of prosthetic heart valve: Secondary | ICD-10-CM

## 2023-04-20 DIAGNOSIS — L309 Dermatitis, unspecified: Secondary | ICD-10-CM | POA: Diagnosis not present

## 2023-04-20 DIAGNOSIS — L732 Hidradenitis suppurativa: Secondary | ICD-10-CM | POA: Diagnosis not present

## 2023-04-20 LAB — POCT INR: INR: 2 (ref 2.0–3.0)

## 2023-04-20 NOTE — Patient Instructions (Signed)
Take 1 1/2 tablets tonight then restart warfarin 1 tablet daily except 1 1/2 tablets on Tuesdays and Fridays. Stopped taking Doxycycline Recheck in 1 wk

## 2023-04-27 ENCOUNTER — Ambulatory Visit: Payer: Medicaid Other | Admitting: Dermatology

## 2023-04-30 ENCOUNTER — Ambulatory Visit: Payer: Medicaid Other | Attending: Internal Medicine | Admitting: *Deleted

## 2023-04-30 DIAGNOSIS — Z5181 Encounter for therapeutic drug level monitoring: Secondary | ICD-10-CM | POA: Diagnosis not present

## 2023-04-30 DIAGNOSIS — Z952 Presence of prosthetic heart valve: Secondary | ICD-10-CM

## 2023-04-30 LAB — POCT INR: INR: 5.1 — AB (ref 2.0–3.0)

## 2023-04-30 NOTE — Patient Instructions (Signed)
Hold warfarin x 2 days then decrease dose to 1 tablet daily while on Prednisone On prednisone 40mg  x 2 wks then 20mg  x 30 days Recheck in 1 wk

## 2023-05-07 ENCOUNTER — Ambulatory Visit: Payer: Medicaid Other | Attending: Internal Medicine | Admitting: *Deleted

## 2023-05-07 DIAGNOSIS — Z5181 Encounter for therapeutic drug level monitoring: Secondary | ICD-10-CM

## 2023-05-07 DIAGNOSIS — Z952 Presence of prosthetic heart valve: Secondary | ICD-10-CM | POA: Diagnosis not present

## 2023-05-07 LAB — POCT INR: POC INR: 3.7

## 2023-05-07 NOTE — Patient Instructions (Signed)
Description   Hold warfarin today and then continue taking 1 tablet daily while on Prednisone On prednisone 40mg  x 2 wks then 20mg  x 30 days (Started 04/25/2023) Recheck in 1 wk

## 2023-05-13 ENCOUNTER — Ambulatory Visit: Payer: Medicaid Other | Attending: Internal Medicine | Admitting: *Deleted

## 2023-05-13 DIAGNOSIS — Z952 Presence of prosthetic heart valve: Secondary | ICD-10-CM | POA: Diagnosis not present

## 2023-05-13 DIAGNOSIS — Z5181 Encounter for therapeutic drug level monitoring: Secondary | ICD-10-CM | POA: Diagnosis not present

## 2023-05-13 LAB — POCT INR: INR: 3.8 — AB (ref 2.0–3.0)

## 2023-05-13 NOTE — Patient Instructions (Signed)
Decrease warfarin to 1 tablet daily except 1/2 tablet on Wednesdays On prednisone 30mg  x 30 days till 12/9 Recheck in 2 wks

## 2023-05-25 DIAGNOSIS — L732 Hidradenitis suppurativa: Secondary | ICD-10-CM | POA: Diagnosis not present

## 2023-05-25 DIAGNOSIS — L88 Pyoderma gangrenosum: Secondary | ICD-10-CM | POA: Diagnosis not present

## 2023-05-27 ENCOUNTER — Ambulatory Visit: Payer: Medicaid Other | Attending: Internal Medicine | Admitting: *Deleted

## 2023-05-27 DIAGNOSIS — Z5181 Encounter for therapeutic drug level monitoring: Secondary | ICD-10-CM | POA: Diagnosis not present

## 2023-05-27 DIAGNOSIS — Z952 Presence of prosthetic heart valve: Secondary | ICD-10-CM

## 2023-05-27 LAB — POCT INR: INR: 4.4 — AB (ref 2.0–3.0)

## 2023-05-27 NOTE — Patient Instructions (Signed)
Hold warfarin tonight then decrease dose to 1 tablet daily except 1/2 tablet on Wednesdays and Sundays Finishing prednisone 30mg  daily.  Then will start 20mg  daily x 30 days then 10mg  daily x 30 days Recheck in 1 wks

## 2023-06-04 ENCOUNTER — Ambulatory Visit: Payer: Medicaid Other | Attending: Internal Medicine | Admitting: *Deleted

## 2023-06-04 DIAGNOSIS — Z952 Presence of prosthetic heart valve: Secondary | ICD-10-CM | POA: Diagnosis not present

## 2023-06-04 DIAGNOSIS — Z5181 Encounter for therapeutic drug level monitoring: Secondary | ICD-10-CM

## 2023-06-04 LAB — POCT INR: INR: 2.8 (ref 2.0–3.0)

## 2023-06-04 NOTE — Patient Instructions (Signed)
Continue warfarin 1 tablet daily except 1/2 tablet on Wednesdays and Sundays On prednisone 30mg  daily to the end of this month.  Then will start 20mg  daily x 30 days then 10mg  daily x 30 days Recheck in 3 wks

## 2023-06-18 ENCOUNTER — Telehealth (HOSPITAL_COMMUNITY): Payer: Self-pay | Admitting: Cardiology

## 2023-06-18 DIAGNOSIS — I5022 Chronic systolic (congestive) heart failure: Secondary | ICD-10-CM

## 2023-06-18 NOTE — Telephone Encounter (Signed)
  Please instruct to weigh daily.   For now continue lasix 80 mg daily x 5 days then back to lasix 40 mg daily.   Please set up BMET.   Robson Trickey NP-C  11:56 AM

## 2023-06-18 NOTE — Telephone Encounter (Signed)
 Pt aware and voiced understanding  Repeat labs 1/10

## 2023-06-18 NOTE — Telephone Encounter (Signed)
 Patient called to report fluid is up Feels like his weight is up currently taking prednisone  from dermatology 40mg  x 14 days, 30 mg x 30 days currently at 20 mg x 30 days, then 10 mg x 30   no home weights routinely however 245 while on the phone Reports clothes are feeling tight on body No SOB No swelling (stomach always feels tight)  NO CP -no dietary changes -reports he could possibly be drinking more than 2L daily   Currently taking lasix  80 daily for the past week KCL 20 MWF   Please advise

## 2023-06-18 NOTE — Addendum Note (Signed)
 Addended by: Seirra Kos, Milagros Reap on: 06/18/2023 03:38 PM   Modules accepted: Orders

## 2023-06-25 ENCOUNTER — Ambulatory Visit: Payer: Medicaid Other | Attending: Internal Medicine | Admitting: *Deleted

## 2023-06-25 DIAGNOSIS — Z5181 Encounter for therapeutic drug level monitoring: Secondary | ICD-10-CM | POA: Diagnosis not present

## 2023-06-25 DIAGNOSIS — Z952 Presence of prosthetic heart valve: Secondary | ICD-10-CM | POA: Diagnosis not present

## 2023-06-25 LAB — POCT INR: INR: 2.6 (ref 2.0–3.0)

## 2023-06-25 NOTE — Patient Instructions (Signed)
 Continue warfarin 1 tablet daily except 1/2 tablet on Wednesdays and Sundays O prednisone 20mg  daily x 30 days then 10mg  daily x 30 days Recheck in 3 wks

## 2023-06-25 NOTE — Addendum Note (Signed)
 Addended by: Noralee Space on: 06/25/2023 09:34 AM   Modules accepted: Orders

## 2023-06-26 ENCOUNTER — Other Ambulatory Visit (HOSPITAL_COMMUNITY): Payer: Medicaid Other

## 2023-07-07 DIAGNOSIS — J069 Acute upper respiratory infection, unspecified: Secondary | ICD-10-CM | POA: Diagnosis not present

## 2023-07-07 DIAGNOSIS — J029 Acute pharyngitis, unspecified: Secondary | ICD-10-CM | POA: Diagnosis not present

## 2023-07-10 IMAGING — DX DG CHEST 2V
2 series · 2 of 2 positions shown · non-contrast
Comparison: 05/09/2021

CLINICAL DATA: Short of breath

EXAM:
CHEST - 2 VIEW

[chest pa]
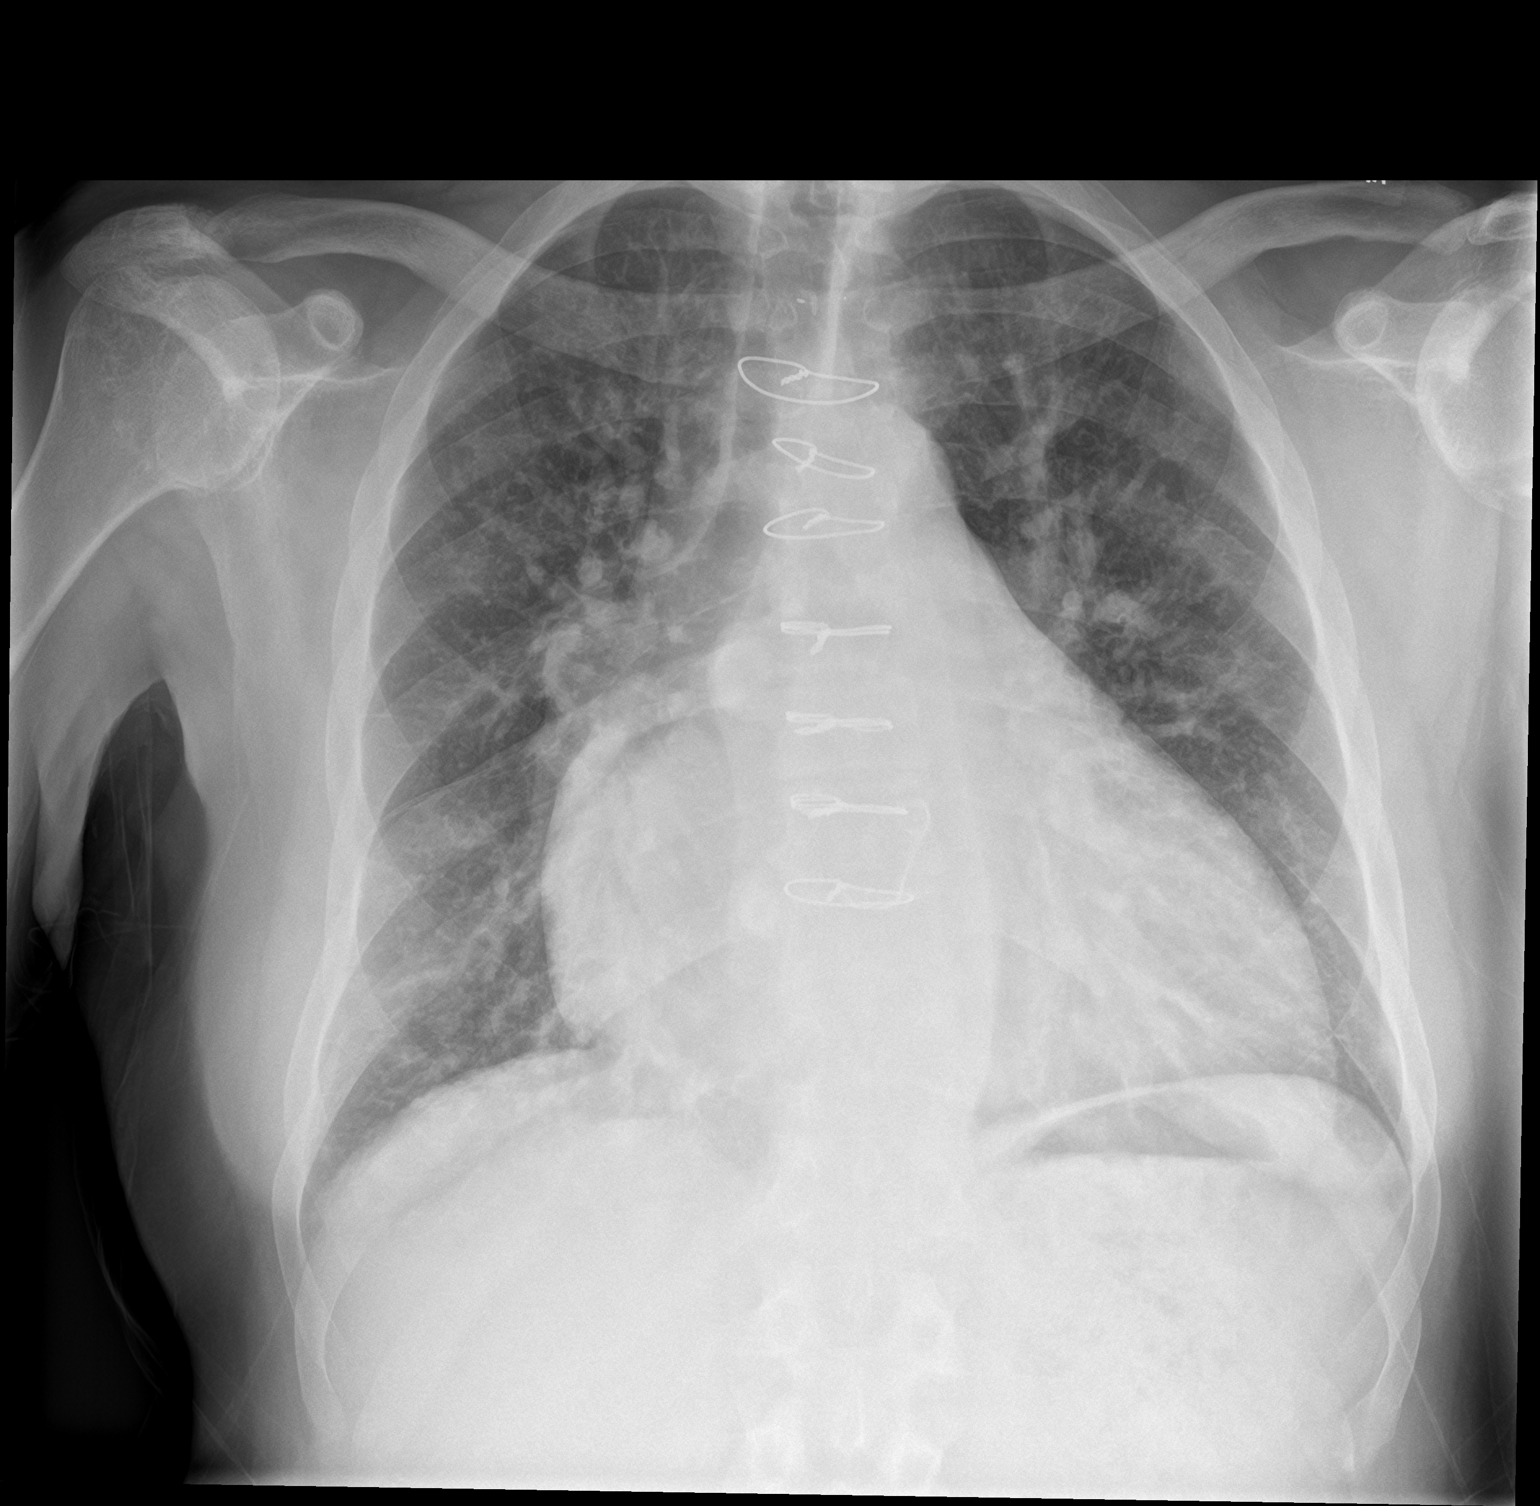

[chest lat]
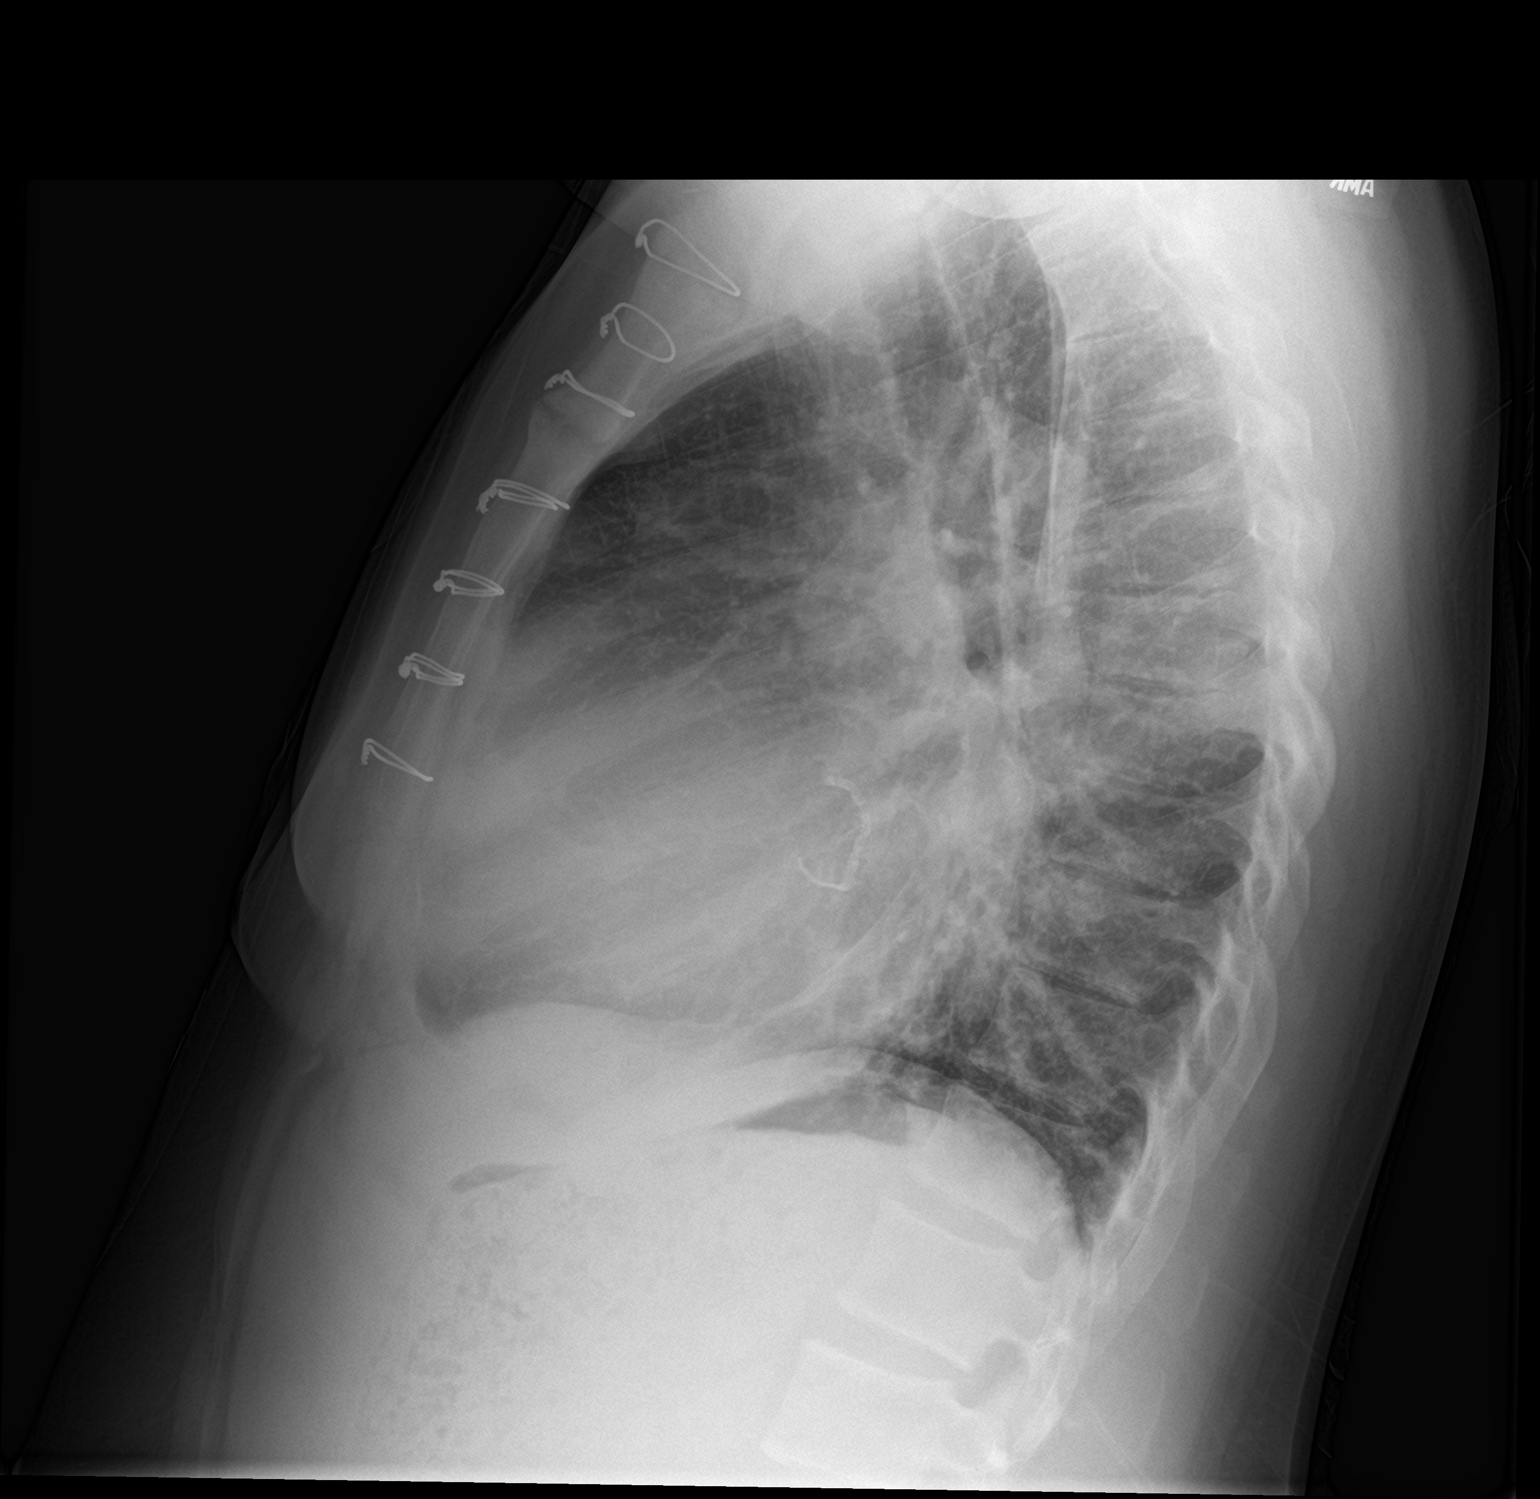

[2 of 2 positions shown; findings below may reference images not displayed]

FINDINGS: Stable cardiomegaly. No pleural effusion or pneumothorax. Persistent
pulmonary vascular congestion. No acute osseous abnormality.
IMPRESSION: Stable cardiomegaly and persistent pulmonary vascular congestion.

## 2023-07-16 ENCOUNTER — Ambulatory Visit: Payer: Medicaid Other | Attending: Internal Medicine | Admitting: *Deleted

## 2023-07-16 DIAGNOSIS — Z5181 Encounter for therapeutic drug level monitoring: Secondary | ICD-10-CM

## 2023-07-16 DIAGNOSIS — Z952 Presence of prosthetic heart valve: Secondary | ICD-10-CM

## 2023-07-16 LAB — POCT INR: INR: 1.8 — AB (ref 2.0–3.0)

## 2023-07-16 IMAGING — DX DG CHEST 2V
2 series · 2 of 2 positions shown · non-contrast
Comparison: Recent PA and lateral chest 05/23/2021.

CLINICAL DATA: Shortness of breath with history of CHF.

EXAM:
CHEST - 2 VIEW

[chest pa]
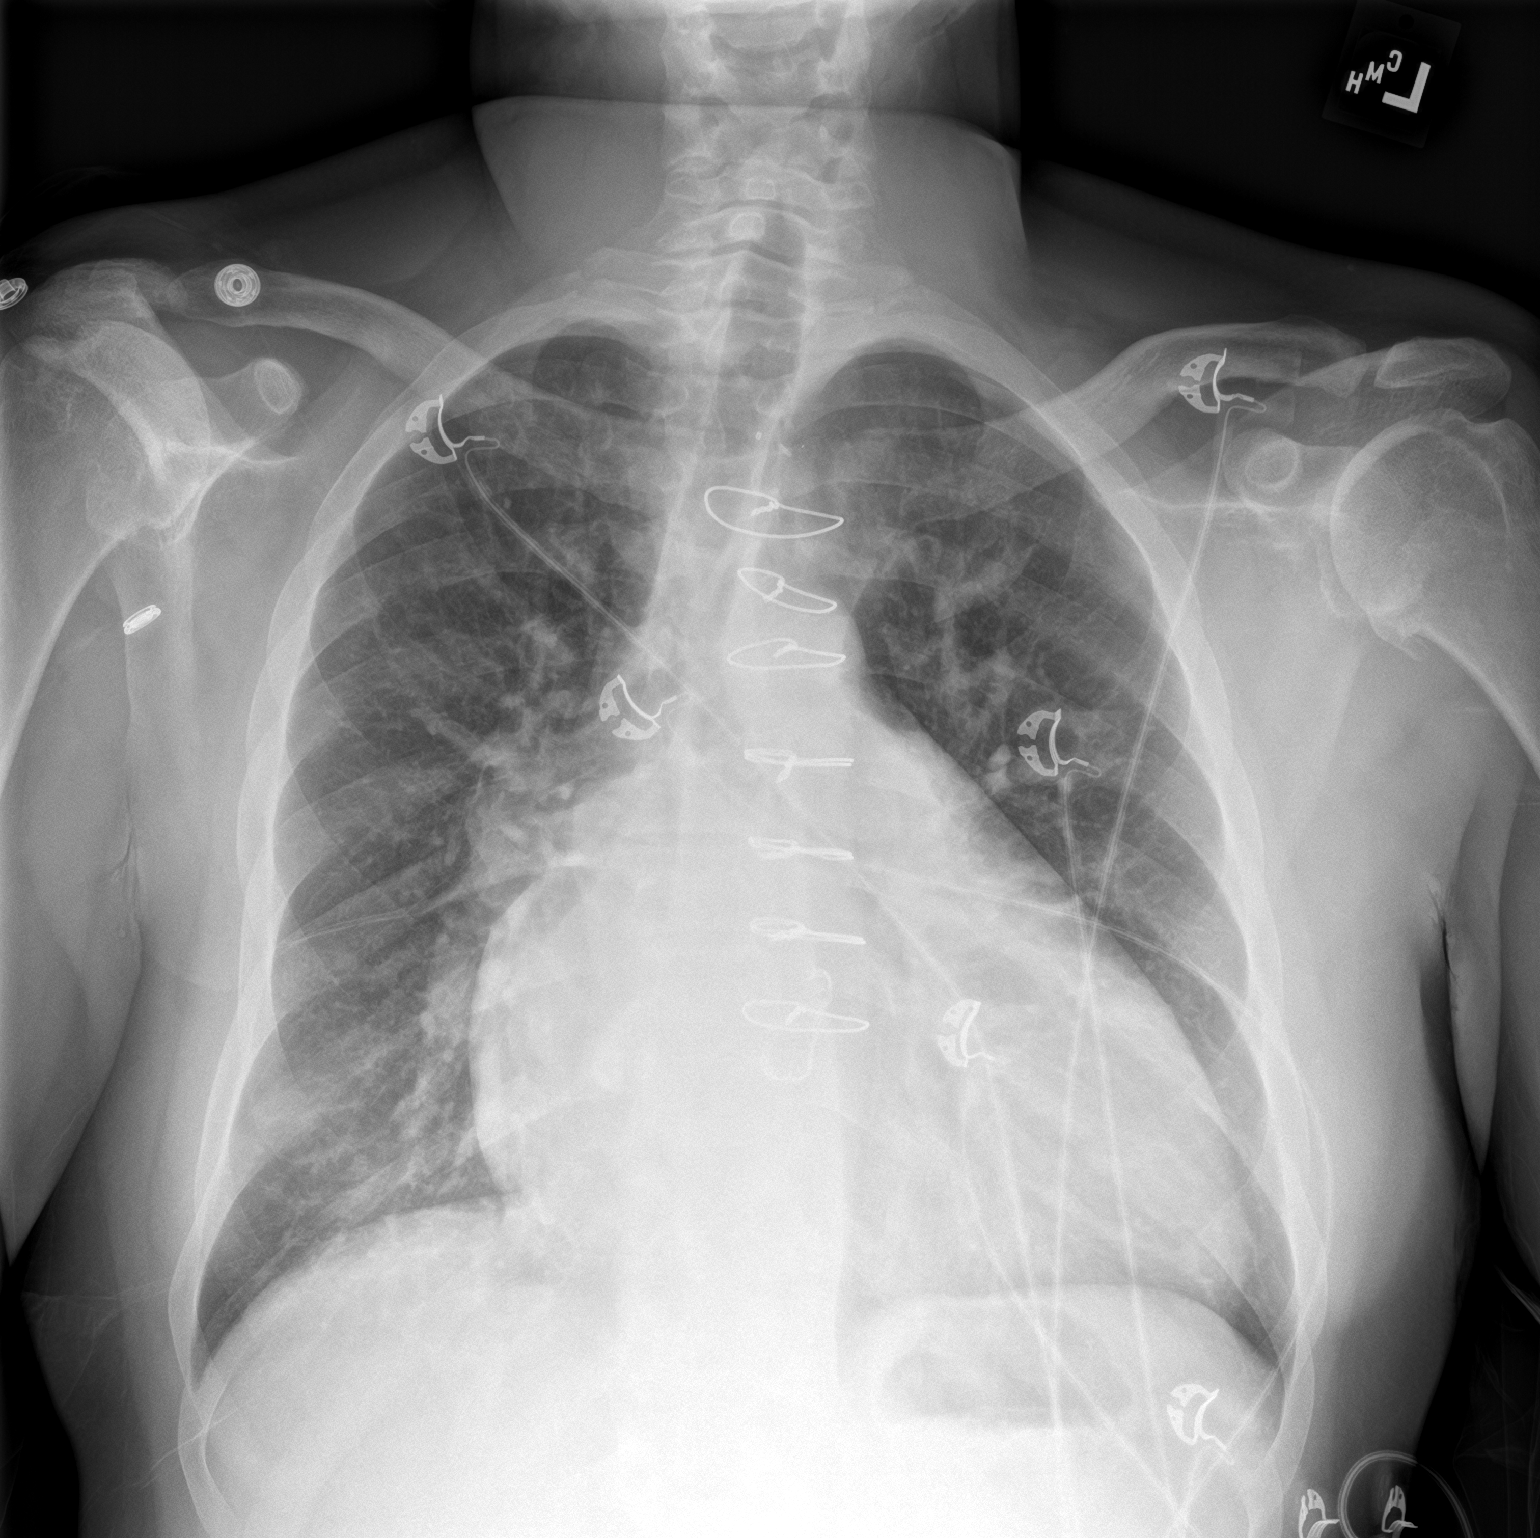

[chest lat]
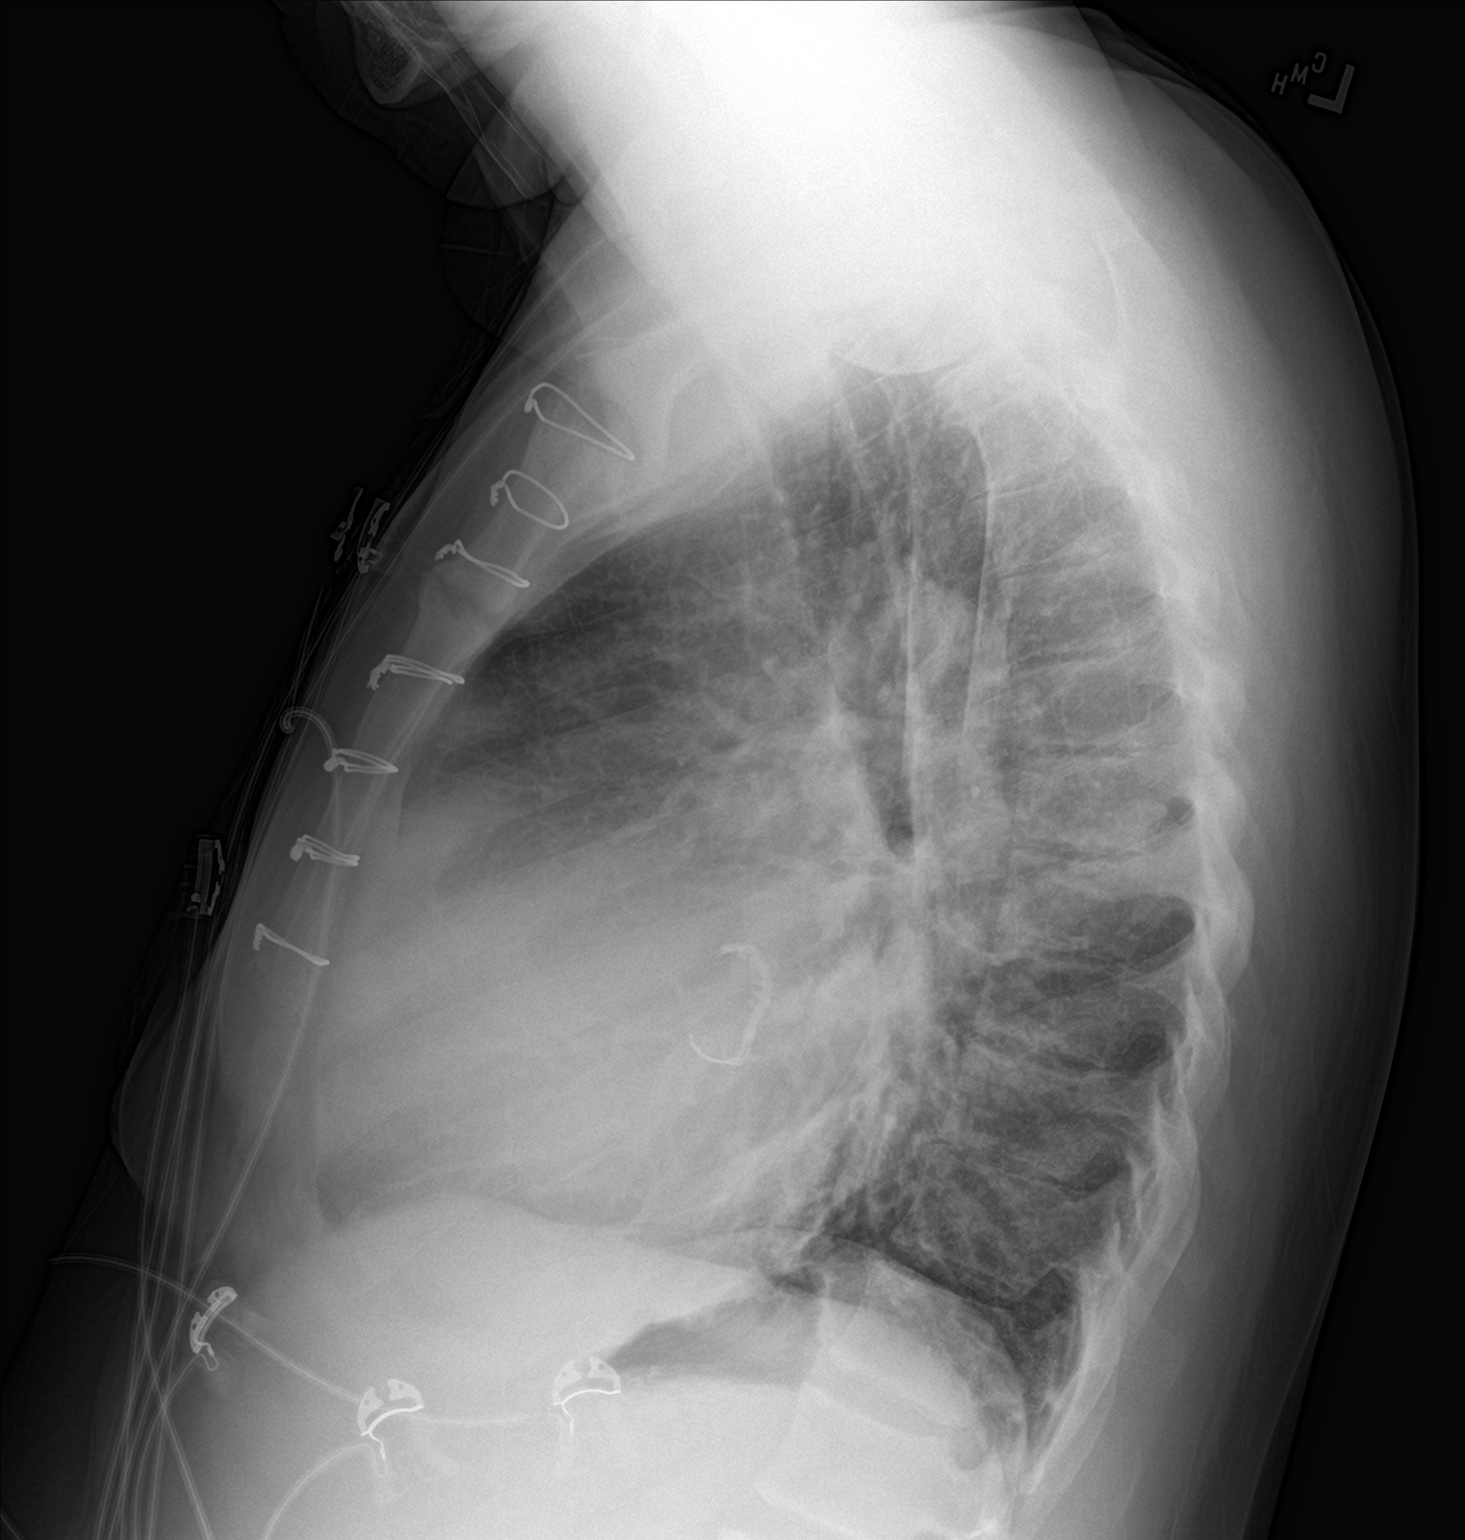

[2 of 2 positions shown; findings below may reference images not displayed]

FINDINGS: Moderate to severe cardiomegaly is again noted with old sternotomy
sutures and prosthetic aortic valve. Central vascular distension and
flow cephalization continue to be seen, without overt edema.

The lungs are clear of infiltrates.  No pleural effusion is seen.
IMPRESSION: Stable cardiomegaly and pulmonary vascular congestion without
evidence of acute edema.

## 2023-07-16 NOTE — Patient Instructions (Signed)
Take warfarin 2 tablets tonight, 1 1/2 tablets tomorrow night then resume 1 tablet daily except 1/2 tablet on Wednesdays and Sundays On prednisone 20mg  daily x 30 days then 10mg  daily x 30 days Recheck in 2 wks

## 2023-07-21 ENCOUNTER — Other Ambulatory Visit (HOSPITAL_COMMUNITY): Payer: Self-pay | Admitting: Cardiology

## 2023-07-21 DIAGNOSIS — I5022 Chronic systolic (congestive) heart failure: Secondary | ICD-10-CM

## 2023-07-23 ENCOUNTER — Telehealth (HOSPITAL_COMMUNITY): Payer: Self-pay | Admitting: Pharmacy Technician

## 2023-07-23 ENCOUNTER — Encounter (HOSPITAL_COMMUNITY): Payer: Self-pay | Admitting: Internal Medicine

## 2023-07-23 ENCOUNTER — Ambulatory Visit (HOSPITAL_BASED_OUTPATIENT_CLINIC_OR_DEPARTMENT_OTHER)
Admission: RE | Admit: 2023-07-23 | Discharge: 2023-07-23 | Disposition: A | Discharge status: Home / Self Care | Payer: Medicaid Other | Source: Ambulatory Visit | Attending: Internal Medicine | Admitting: Internal Medicine

## 2023-07-23 ENCOUNTER — Other Ambulatory Visit (HOSPITAL_COMMUNITY): Payer: Self-pay

## 2023-07-23 ENCOUNTER — Ambulatory Visit (HOSPITAL_COMMUNITY)
Admission: RE | Admit: 2023-07-23 | Discharge: 2023-07-23 | Disposition: A | Discharge status: Home / Self Care | Payer: Medicaid Other | Source: Ambulatory Visit | Attending: Internal Medicine | Admitting: Internal Medicine

## 2023-07-23 VITALS — BP 138/98 | HR 78 | Ht 69.0 in | Wt 255.4 lb

## 2023-07-23 DIAGNOSIS — L88 Pyoderma gangrenosum: Secondary | ICD-10-CM | POA: Diagnosis not present

## 2023-07-23 DIAGNOSIS — I4892 Unspecified atrial flutter: Secondary | ICD-10-CM | POA: Diagnosis not present

## 2023-07-23 DIAGNOSIS — I5082 Biventricular heart failure: Secondary | ICD-10-CM | POA: Insufficient documentation

## 2023-07-23 DIAGNOSIS — M4690 Unspecified inflammatory spondylopathy, site unspecified: Secondary | ICD-10-CM | POA: Insufficient documentation

## 2023-07-23 DIAGNOSIS — I5022 Chronic systolic (congestive) heart failure: Secondary | ICD-10-CM

## 2023-07-23 DIAGNOSIS — Z952 Presence of prosthetic heart valve: Secondary | ICD-10-CM | POA: Insufficient documentation

## 2023-07-23 DIAGNOSIS — I69334 Monoplegia of upper limb following cerebral infarction affecting left non-dominant side: Secondary | ICD-10-CM | POA: Diagnosis not present

## 2023-07-23 DIAGNOSIS — Z87891 Personal history of nicotine dependence: Secondary | ICD-10-CM | POA: Insufficient documentation

## 2023-07-23 DIAGNOSIS — I480 Paroxysmal atrial fibrillation: Secondary | ICD-10-CM | POA: Diagnosis not present

## 2023-07-23 DIAGNOSIS — Z7982 Long term (current) use of aspirin: Secondary | ICD-10-CM | POA: Diagnosis not present

## 2023-07-23 DIAGNOSIS — I77810 Thoracic aortic ectasia: Secondary | ICD-10-CM | POA: Insufficient documentation

## 2023-07-23 DIAGNOSIS — Z7901 Long term (current) use of anticoagulants: Secondary | ICD-10-CM | POA: Insufficient documentation

## 2023-07-23 DIAGNOSIS — T826XXS Infection and inflammatory reaction due to cardiac valve prosthesis, sequela: Secondary | ICD-10-CM | POA: Insufficient documentation

## 2023-07-23 DIAGNOSIS — Z006 Encounter for examination for normal comparison and control in clinical research program: Secondary | ICD-10-CM

## 2023-07-23 DIAGNOSIS — D8989 Other specified disorders involving the immune mechanism, not elsewhere classified: Secondary | ICD-10-CM

## 2023-07-23 DIAGNOSIS — I330 Acute and subacute infective endocarditis: Secondary | ICD-10-CM | POA: Diagnosis not present

## 2023-07-23 LAB — ECHOCARDIOGRAM COMPLETE
AR max vel: 2.42 cm2
AV Area VTI: 2.45 cm2
AV Area mean vel: 2.55 cm2
AV Mean grad: 3 mm[Hg]
AV Peak grad: 5.4 mm[Hg]
Ao pk vel: 1.16 m/s
Area-P 1/2: 4.49 cm2
Calc EF: 33.6 %
MV VTI: 1.49 cm2
S' Lateral: 5.7 cm
Single Plane A2C EF: 37 %
Single Plane A4C EF: 29 %

## 2023-07-23 LAB — CBC
HCT: 36.8 % — ABNORMAL LOW (ref 39.0–52.0)
Hemoglobin: 12 g/dL — ABNORMAL LOW (ref 13.0–17.0)
MCH: 25.8 pg — ABNORMAL LOW (ref 26.0–34.0)
MCHC: 32.6 g/dL (ref 30.0–36.0)
MCV: 79 fL — ABNORMAL LOW (ref 80.0–100.0)
Platelets: 334 10*3/uL (ref 150–400)
RBC: 4.66 MIL/uL (ref 4.22–5.81)
RDW: 16.5 % — ABNORMAL HIGH (ref 11.5–15.5)
WBC: 10.3 10*3/uL (ref 4.0–10.5)
nRBC: 0 % (ref 0.0–0.2)

## 2023-07-23 LAB — COMPREHENSIVE METABOLIC PANEL
ALT: 13 U/L (ref 0–44)
AST: 24 U/L (ref 15–41)
Albumin: 2.5 g/dL — ABNORMAL LOW (ref 3.5–5.0)
Alkaline Phosphatase: 103 U/L (ref 38–126)
Anion gap: 13 (ref 5–15)
BUN: 20 mg/dL (ref 6–20)
CO2: 24 mmol/L (ref 22–32)
Calcium: 8.6 mg/dL — ABNORMAL LOW (ref 8.9–10.3)
Chloride: 97 mmol/L — ABNORMAL LOW (ref 98–111)
Creatinine, Ser: 1.33 mg/dL — ABNORMAL HIGH (ref 0.61–1.24)
GFR, Estimated: >60 mL/min (ref >60)
Glucose, Bld: 94 mg/dL (ref 70–99)
Potassium: 3.4 mmol/L — ABNORMAL LOW (ref 3.5–5.1)
Sodium: 134 mmol/L — ABNORMAL LOW (ref 135–145)
Total Bilirubin: <0.2 mg/dL (ref 0.0–1.2)
Total Protein: 9.5 g/dL — ABNORMAL HIGH (ref 6.5–8.1)

## 2023-07-23 LAB — BRAIN NATRIURETIC PEPTIDE: B Natriuretic Peptide: 397.6 pg/mL — ABNORMAL HIGH (ref 0.0–100.0)

## 2023-07-23 MED ORDER — SACUBITRIL-VALSARTAN 24-26 MG PO TABS: 1.0000 | ORAL_TABLET | Freq: Two times a day (BID) | ORAL | 1 refills | Status: DC

## 2023-07-23 MED ORDER — FUROSEMIDE 80 MG PO TABS: 80.0000 mg | ORAL_TABLET | Freq: Every day | ORAL | 1 refills | Status: DC

## 2023-07-23 NOTE — Progress Notes (Signed)
 Advanced [facility] Clinic Note   PCP: Bucio, Silvio BROCKS, FNP Primary Cardiologist: None  AHFC: Dr. Cherrie   Chief complaint: Heart failure  HPI: Donald [name] is a 38 y.o. male with history of mitral regurgitation (s/p MV repair with resection of ruptured anterior papillary muscle and reconstruction of papillary chord and placement of annuloplasty ring in 2019). Had recurrent MR and underwent MVR w/ mechanical valve on 06/10/21 by Dr. Marilynne at [facility].   Admitted 12/22 for a/c CHF and evidence of low output and AKI. Echo with EF 20-25% with severe MR/mod MS and severe RV dysfunction. PICC place, started on empiric milrinone . AKI resolved with addition of milrinone . He underwent MVR w/ mechanical valve on 12/26 by Dr. Marilynne at [facility].  Admitted 2/23 with low output HF in setting of AF. Echo showed LVEF <20%, RV severely reduced. Mechanical MV ok, Gradient 3 mmHg. Started on milrinone  and amio gtt. Diuresed w/ IV Lasix . Diuresed and extubated. Converted to NSR, amio and milrinone  weaned off. GDMT titrated. Discharged home, weight was 198 lb.   Echo 10/11/21: EF 25%. Mild RV dysfunction.  Admitted 7/23 with cardiogenic shock. Echo EF 15%, RV sev HK, MVR thick, mean gradient 5-6. Intubated. TEE with severe biventricular failure with vegetation on mechanical MVR. Had CODE stroke. MRI w/ several small acute right MCA territory ischemic strokes, suggestive of a cardioembolic etiology.  Had upper extremity swelling. 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  Ortho consulted and rec transfer to [facility] for Ortho Onc. While at [facility], UE mass found to be hematoma. Kidney function declined and he was transiently on hemodialysis.   Seen in ED 04/05/22 with palpitations. Found to be in SVT vs AFL, and hypotensive. Underwent emergent DCCV to NSR, BP improved.  Echo 12/23 EF 25-30% RV sev HK MVR ok mean gradient 6 (? Mild PPM)  Today he returns for AHF follow up. Says he is doing ok. Weight on  scale today up 25 pounds. Feels bloated in stomach. No significant DOE. No LE edema. Has been doubling his lasix  to 80 daily   Echo 25-30% RV mod to severely down MV ok Personally reviewed   Cardiac Studies: - Echo (12/23) EF 25-30%, RV severely reduced - TEE (7/23): LVEF < 20%, RV severely reduced, mean mitral valve gradient 8 mmHg with trivial MR + vegetation on MV - Echo (7/23): EF < 15%, RV severely dow - Echo (4/23): EF 25%, mild RV dysfunction - Echo (2/23): EF < 20%, severe LV dysfunction with global HK, grade II DD, RV severely reduced, S/p mitral valve repair. MV mean gradient 3 mmHg  - Echo (12/22): EF 20-25%, severe LV dysfunction with global HK, mild LVH, RV  moderately reduced, elevated MV gradient , mild AI - R/LHC (09/11/20):  RA = 7 RV = 49/10 PA = 48/16 (32) PCW = 17 (v=32) Fick cardiac output/index = 5.3/2.5 PVR = 2.9 WU Ao sat = 99% PA sat = 71%, 70% High SVC sat =  75% 1. Normal coronary arteries 2. NICM EF 30-35% 3. Severe MR with prominent v-waves in PCWP tracing 4. Mild pulmonary venous HTN with normal CO  Past Medical History:  Diagnosis Date   Anemia    Autoimmune disorder (HCC)    pyoderma gangrenosum   CHF (congestive heart failure) (HCC)    Chronic systolic heart failure (HCC)    a. EF 35-40% by echo in 07/2018 b. EF at 45% by repeat echo in 04/2020   DVT (deep venous thrombosis) (HCC)  h/o   Dysrhythmia    Hidradenitis suppurativa    Mitral regurgitation    a. 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 b. 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HCC)    arthritis   Shock, septic and cardiogenic 12/28/2021   Tricuspid regurgitation     Current Outpatient Medications  Medication Sig Dispense Refill    acetaminophen  (TYLENOL ) 325 MG tablet Take 2 tablets (650 mg total) by mouth every 4 (four) hours as needed for headache or mild pain.     amiodarone  (PACERONE ) 200 MG tablet Take 1 tablet by mouth once daily 30 tablet 6   digoxin  (LANOXIN ) 0.125 MG tablet Take 1 tablet (0.125 mg total) by mouth daily. 90 tablet 3   FARXIGA  10 MG TABS tablet Take 1 tablet by mouth once daily 30 tablet 5   furosemide  (LASIX ) 40 MG tablet Take 40 mg by mouth daily.     gabapentin  (NEURONTIN ) 300 MG capsule Take 2 capsules (600 mg total) by mouth 2 (two) times daily. 120 capsule 11   pantoprazole  (PROTONIX ) 40 MG tablet Take 1 tablet (40 mg total) by mouth daily. 30 tablet 8   potassium chloride  SA (KLOR-CON  M) 20 MEQ tablet Take 20 mEq by mouth every Monday, Wednesday, and Friday.     sertraline  (ZOLOFT ) 50 MG tablet Take 1 tablet (50 mg total) by mouth daily. 60 tablet 4   sildenafil  (VIAGRA ) 50 MG tablet Take 1 tablet (50 mg total) by mouth daily as needed for erectile dysfunction. 10 tablet 1   spironolactone  (ALDACTONE ) 25 MG tablet Take 0.5 tablets (12.5 mg total) by mouth daily. 90 tablet 1   warfarin (COUMADIN ) 5 MG tablet Take warfarin 1 - 1 1/2 tablets daily or as directed by coumadin  clinic (Patient taking differently: Take 5-7.5 mg by mouth See admin instructions. Take 1 & 1/2 tablets daily except 1 tablet on Mondays, Wednesdays and Fridays) 45 tablet 5   No current facility-administered medications for this encounter.    No Known Allergies    Social History   Socioeconomic History   Marital status: Single    Spouse name: Not on file   Number of children: Not on file   Years of education: Not on file   Highest education level: Not on file  Occupational History   Not on file  Tobacco Use   Smoking status: Former    Current packs/day: 0.00    Types: Cigarettes    Quit date: 10/14/2020    Years since quitting: 2.7   Smokeless tobacco: Never  Vaping Use   Vaping status: Never Used  Substance  and Sexual Activity   Alcohol use: No   Drug use: No   Sexual activity: Yes  Other Topics Concern   Not on file  Social History Narrative   Left handed       Are you currently employed ? no   What is your current occupation?   Do you live at home alone?no   Who lives with you? son   What type of home do you live in: 1 story or 2 story? one   Caffeine no    Social Drivers of Corporate Investment Banker Strain: Low Risk  (03/13/2021)   Received from [facility],  [facility]   Overall Financial Resource Strain (CARDIA)    Difficulty of Paying Living Expenses: Not hard at all  Food Insecurity: No Food Insecurity (04/03/2023)   Hunger Vital Sign    Worried About Running Out of Food in the Last Year: Never true    Ran Out of Food in the Last Year: Never true  Transportation Needs: No Transportation Needs (04/03/2023)   PRAPARE - Administrator, Civil Service (Medical): No    Lack of Transportation (Non-Medical): No  Physical Activity: Insufficiently Active (05/18/2020)   Received from [facility], [facility]   Exercise Vital Sign    Days of Exercise per Week: 7 days    Minutes of Exercise per Session: 20 min  Stress: No Stress Concern Present (05/18/2020)   Received from [facility], [facility] - Occupational Stress Questionnaire    Feeling of Stress : Not at all  Social Connections: Socially Isolated (05/18/2020)   Received from [facility], [facility]   Social Connection and Isolation Panel [NHANES]    Frequency of Communication with Friends and Family: More than three times a week    Frequency of Social Gatherings with Friends and Family: More than three times a week    Attends Religious Services: Never    Database Administrator or Organizations: No    Attends Banker Meetings: Never    Marital Status: Never married  Intimate Partner Violence: Not At Risk (04/03/2023)    Humiliation, Afraid, Rape, and Kick questionnaire    Fear of Current or Ex-Partner: No    Emotionally Abused: No    Physically Abused: No    Sexually Abused: No   Family History  Problem Relation Age of Onset   Multiple sclerosis Mother    Psoriasis Mother    Depression Father    Diabetes Father    Diabetes Paternal Grandmother    BP (!) 138/98   Pulse 78   Ht 5' 9 (1.753 m)   Wt 115.8 kg (255 lb 6.4 oz)   SpO2 99%   BMI 37.72 kg/m   Wt Readings from Last 3 Encounters:  07/23/23 115.8 kg (255 lb 6.4 oz)  04/01/23 103.9 kg (229 lb)  02/27/23 101.6 kg (224 lb)   PHYSICAL EXAM: General:  Well appearing. No resp difficulty HEENT: normal Neck: supple. no JVD. Carotids 2+ bilat; no bruits. No lymphadenopathy or thryomegaly appreciated. Cor: Regular rate & rhythm. Mechanical s1 Lungs: clear Abdomen: obese soft, nontender, nondistended. No hepatosplenomegaly. No bruits or masses. Good bowel sounds. Extremities: no cyanosis, clubbing, rash, edema Neuro: alert & orientedx3, cranial nerves grossly intact. L forearm paralysis/atrophy Affect pleasant  ASSESSMENT & PLAN:  1. Chronic HFrEF/Biventricular Failure - Echo (4/23): EF of 20-25% and RV function was severely reduced.  - Echo (7/23): EF 10% RV severely HK. Cardiogenic/septic shock, required NE and milrinone . - TEE (7/23) showed severe BiV failure + vegetation on mechanical MVR. - Echo (12/23) EF 25-30%, RV severely reduced - CPX 5/24 PFTs with moderate restriction pVO2 14.2 (40%) when adjusted to ibw 17.3 Slope 32 RER 1.10  - Echo today 07/23/23 EF 25-30% MVR stable RV moderately decreased - Doing well. NYHA I-II volume ok - Continue Lasix  40 mg daily + 20mEq KDUR daily - Continue Coreg  3.125 mg bid. - Continue Farxiga  10 mg daily. - Continue spiro 12.5mg  daily - Continue digoxin  0.125mg  daily - check level  today  - Start Entresto  24/26 bid  - Overall stable from HF perspective. Continue close f/u. If deteriorates will  need transplant eval - Labs today    2. 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  - Underwent MVR with mechanical mitral valve in 12/22 at [facility].  - TEE 7/23 severe biventricular failure. + vegetation on mechanical MVR. -> Completed 6 weeks IV abx. No infectious symptoms - Coumadin  Clinic managing INRs. No bleeding - Valve stable on echo today Personally reviewed  3. Paroxysmal Atrial Fibrillation - Does not tolerate AF - Remains in NSR on amio 200 daily -> continue - Amio labs today  4. Autoimmune Disorder  - He has seronegative spondylitis and pyoderma gangrenosum. - Following with Derm at [facility] on prednisone  - No longer on secukinumab injections. Awaiting for insurance to approve Stellara   5. 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

## 2023-07-23 NOTE — Research (Signed)
 SITE: [id number]     Subject # [id number]    Subprotocol: A  Inclusion Criteria  Patients who meet all of the following criteria are eligible for enrollment as study participants:  Yes No  Age > 36 years old X   Eligible to wear Holter Study X    Exclusion Criteria  Patients who meet any of these criteria are not eligible for enrollment as study participants: Yes No  1. Receiving any mechanical (respiratory or circulatory) or renal support therapy at Screening or during Visit #1.  X  2.  Any other conditions that in the opinion of the investigators are likely to prevent compliance with the study protocol or pose a safety concern if the subject participates in the study.  X  3. Poor tolerance, namely susceptible to severe skin allergies from ECG adhesive patch application.  X   Protocol: REV H                                     Residential Zip [address] (First 3 digits ONLY)                                             [facility] Informed Consent   Subject Name: Donald [name]  Subject met inclusion and exclusion criteria.  The informed consent form, study requirements and expectations were reviewed with the subject. Subject had opportunity to read consent and questions and concerns were addressed prior to the signing of the consent form.  The subject verbalized understanding of the trial requirements.  The subject agreed to participate in the [facility] EF ACT trial and signed the informed consent at 13:33 on 23-Jul-2023.  The informed consent was obtained prior to performance of any protocol-specific procedures for the subject.  A copy of the signed informed consent was given to the subject and a copy was placed in the subject's medical record.   Dorthea LITTIE Louder          Current Outpatient Medications:    acetaminophen  (TYLENOL ) 325 MG tablet, Take 2 tablets (650 mg total) by mouth every 4 (four) hours as needed for headache or mild pain., Disp: , Rfl:    amiodarone  (PACERONE ) 200 MG tablet,  Take 1 tablet by mouth once daily, Disp: 30 tablet, Rfl: 6   digoxin  (LANOXIN ) 0.125 MG tablet, Take 1 tablet (0.125 mg total) by mouth daily., Disp: 90 tablet, Rfl: 3   FARXIGA  10 MG TABS tablet, Take 1 tablet by mouth once daily, Disp: 30 tablet, Rfl: 5   furosemide  (LASIX ) 40 MG tablet, Take 40 mg by mouth daily., Disp: , Rfl:    gabapentin  (NEURONTIN ) 300 MG capsule, Take 2 capsules (600 mg total) by mouth 2 (two) times daily., Disp: 120 capsule, Rfl: 11   pantoprazole  (PROTONIX ) 40 MG tablet, Take 1 tablet (40 mg total) by mouth daily., Disp: 30 tablet, Rfl: 8   potassium chloride  SA (KLOR-CON  M) 20 MEQ tablet, Take 20 mEq by mouth every Monday, Wednesday, and Friday., Disp: , Rfl:    sertraline  (ZOLOFT ) 50 MG tablet, Take 1 tablet (50 mg total) by mouth daily., Disp: 60 tablet, Rfl: 4   sildenafil  (VIAGRA ) 50 MG tablet, Take 1 tablet (50 mg total) by mouth daily as needed for erectile dysfunction., Disp: 10 tablet, Rfl:  1   spironolactone  (ALDACTONE ) 25 MG tablet, Take 0.5 tablets (12.5 mg total) by mouth daily., Disp: 90 tablet, Rfl: 1   warfarin (COUMADIN ) 5 MG tablet, Take warfarin 1 - 1 1/2 tablets daily or as directed by coumadin  clinic (Patient taking differently: Take 5-7.5 mg by mouth See admin instructions. Take 1 & 1/2 tablets daily except 1 tablet on Mondays, Wednesdays and Fridays), Disp: 45 tablet, Rfl: 5

## 2023-07-23 NOTE — Telephone Encounter (Signed)
 Pharmacy Patient Advocate Encounter  Insurance verification completed.   The patient is insured through [facility]   Ran test claim for Entresto . Currently a quantity of 180 is a 90 day supply and the co-pay is $4 .  This test claim was processed through [facility]- copay amounts may vary at other pharmacies due to pharmacy/plan contracts, or as the patient moves through the different stages of their insurance plan.   Almarie JULIANNA Pa, CPhT

## 2023-07-23 NOTE — Patient Instructions (Addendum)
 Medication Changes:  START ENTRESTO  24-18M G BY MOUTH TWICE DAILY  INCREASE LASIX  80MG  BY MOUTH DAILY   Lab Work:  Labs done today, your results will be available in MyChart, we will contact you for abnormal readings.    Follow-Up in:  6 MONTHS AS SCHEDULED   At the Advanced Heart Failure Clinic, you and your health needs are our priority. We have a designated team specialized in the treatment of Heart Failure. This Care Team includes your primary Heart Failure Specialized Cardiologist (physician), Advanced Practice Providers (APPs- Physician Assistants and Nurse Practitioners), and Pharmacist who all work together to provide you with the care you need, when you need it.   You may see any of the following providers on your designated Care Team at your next follow up:  Dr. Toribio Fuel Dr. Ezra Shuck Dr. Ria Commander Dr. Odis Brownie Greig Mosses, NP Caffie Shed, [place] [facility] [place], [place] Beckey Coe, NP Jordan Lee, NP Tinnie Redman, PharmD   Please be sure to bring in all your medications bottles to every appointment.   Need to Contact Us :  If you have any questions or concerns before your next appointment please send us  a message through [place] or call our office at [phone].    TO LEAVE A MESSAGE FOR THE NURSE SELECT OPTION 2, PLEASE LEAVE A MESSAGE INCLUDING: YOUR NAME DATE OF BIRTH CALL BACK NUMBER REASON FOR CALL**this is important as we prioritize the call backs  YOU WILL RECEIVE A CALL BACK THE SAME DAY AS LONG AS YOU CALL BEFORE 4:00 PM

## 2023-07-23 NOTE — Progress Notes (Signed)
  Echocardiogram 2D Echocardiogram has been performed.  Vania Rosero L Shanielle Correll RDCS 07/23/2023, 1:38 PM

## 2023-07-25 LAB — MISC LABCORP TEST (SEND OUT): Labcorp test code: 7385

## 2023-07-28 ENCOUNTER — Other Ambulatory Visit (HOSPITAL_COMMUNITY): Payer: Self-pay | Admitting: *Deleted

## 2023-07-28 ENCOUNTER — Telehealth (HOSPITAL_COMMUNITY): Payer: Self-pay | Admitting: *Deleted

## 2023-07-28 DIAGNOSIS — L88 Pyoderma gangrenosum: Secondary | ICD-10-CM | POA: Diagnosis not present

## 2023-07-28 DIAGNOSIS — L732 Hidradenitis suppurativa: Secondary | ICD-10-CM | POA: Diagnosis not present

## 2023-07-28 DIAGNOSIS — I480 Paroxysmal atrial fibrillation: Secondary | ICD-10-CM | POA: Diagnosis not present

## 2023-07-28 DIAGNOSIS — I5022 Chronic systolic (congestive) heart failure: Secondary | ICD-10-CM

## 2023-07-28 DIAGNOSIS — F418 Other specified anxiety disorders: Secondary | ICD-10-CM | POA: Diagnosis not present

## 2023-07-28 DIAGNOSIS — N529 Male erectile dysfunction, unspecified: Secondary | ICD-10-CM | POA: Diagnosis not present

## 2023-07-28 DIAGNOSIS — I6930 Unspecified sequelae of cerebral infarction: Secondary | ICD-10-CM | POA: Diagnosis not present

## 2023-07-28 DIAGNOSIS — Z713 Dietary counseling and surveillance: Secondary | ICD-10-CM | POA: Diagnosis not present

## 2023-07-28 DIAGNOSIS — R7303 Prediabetes: Secondary | ICD-10-CM | POA: Diagnosis not present

## 2023-07-28 DIAGNOSIS — Z952 Presence of prosthetic heart valve: Secondary | ICD-10-CM | POA: Diagnosis not present

## 2023-07-28 DIAGNOSIS — K219 Gastro-esophageal reflux disease without esophagitis: Secondary | ICD-10-CM | POA: Diagnosis not present

## 2023-07-28 DIAGNOSIS — I509 Heart failure, unspecified: Secondary | ICD-10-CM | POA: Diagnosis not present

## 2023-07-28 DIAGNOSIS — E669 Obesity, unspecified: Secondary | ICD-10-CM | POA: Diagnosis not present

## 2023-07-28 MED ORDER — POTASSIUM CHLORIDE CRYS ER 20 MEQ PO TBCR: 20.0000 meq | EXTENDED_RELEASE_TABLET | Freq: Every day | ORAL | 6 refills | Status: DC

## 2023-07-28 NOTE — Telephone Encounter (Signed)
Spoke w/pt, he is aware, agreeable, and verbalized understanding, rx sent in, order placed for bmet at [facility] in 1 week   Pt states PCP also started him on Metformin 500 mg Daily today, med list updated

## 2023-07-28 NOTE — Telephone Encounter (Signed)
-----   Message from Arvilla Meres sent at 07/24/2023  6:40 PM EST ----- Regarding: FW: Please have him take potassium 40 x 1 then add 20 kcl daily.   Repeat bmet 1 week. ----- Message ----- From: Leory Plowman, [facility] [place] Sent: 07/23/2023   3:53 PM EST To: Dolores Patty, MD

## 2023-07-30 ENCOUNTER — Ambulatory Visit: Payer: Medicaid Other | Attending: Cardiology | Admitting: *Deleted

## 2023-07-30 DIAGNOSIS — Z952 Presence of prosthetic heart valve: Secondary | ICD-10-CM

## 2023-07-30 DIAGNOSIS — I480 Paroxysmal atrial fibrillation: Secondary | ICD-10-CM

## 2023-07-30 DIAGNOSIS — I82622 Acute embolism and thrombosis of deep veins of left upper extremity: Secondary | ICD-10-CM

## 2023-07-30 DIAGNOSIS — Z5181 Encounter for therapeutic drug level monitoring: Secondary | ICD-10-CM

## 2023-07-30 DIAGNOSIS — I6349 Cerebral infarction due to embolism of other cerebral artery: Secondary | ICD-10-CM | POA: Diagnosis not present

## 2023-07-30 LAB — POCT INR: INR: 1.9 — AB (ref 2.0–3.0)

## 2023-07-30 NOTE — Patient Instructions (Signed)
Take warfarin 1 1/2 tablet tonight then increase dose to 1 tablet daily Finished prednisone Recheck in 2 wks

## 2023-08-03 DIAGNOSIS — L88 Pyoderma gangrenosum: Secondary | ICD-10-CM | POA: Diagnosis not present

## 2023-08-03 DIAGNOSIS — L732 Hidradenitis suppurativa: Secondary | ICD-10-CM | POA: Diagnosis not present

## 2023-08-13 ENCOUNTER — Ambulatory Visit: Payer: Medicaid Other | Attending: Cardiology | Admitting: *Deleted

## 2023-08-13 DIAGNOSIS — Z5181 Encounter for therapeutic drug level monitoring: Secondary | ICD-10-CM

## 2023-08-13 DIAGNOSIS — I6349 Cerebral infarction due to embolism of other cerebral artery: Secondary | ICD-10-CM | POA: Diagnosis not present

## 2023-08-13 DIAGNOSIS — Z952 Presence of prosthetic heart valve: Secondary | ICD-10-CM

## 2023-08-13 DIAGNOSIS — I480 Paroxysmal atrial fibrillation: Secondary | ICD-10-CM | POA: Diagnosis not present

## 2023-08-13 LAB — POCT INR: INR: 2.3 (ref 2.0–3.0)

## 2023-08-13 NOTE — Patient Instructions (Addendum)
Take warfarin 1 1/2 tablets tonight then resume 1 tablet daily.   Recheck in 4 wks 

## 2023-08-16 DIAGNOSIS — J069 Acute upper respiratory infection, unspecified: Secondary | ICD-10-CM | POA: Diagnosis not present

## 2023-08-16 DIAGNOSIS — R059 Cough, unspecified: Secondary | ICD-10-CM | POA: Diagnosis not present

## 2023-08-16 DIAGNOSIS — Z20822 Contact with and (suspected) exposure to covid-19: Secondary | ICD-10-CM | POA: Diagnosis not present

## 2023-08-16 DIAGNOSIS — J9801 Acute bronchospasm: Secondary | ICD-10-CM | POA: Diagnosis not present

## 2023-08-19 NOTE — Progress Notes (Signed)
 I saw Donald [name] in neurology clinic on 08/27/23 in follow up for right hemispheric strokes and left arm weakness.   HPI: Donald [name] is a 38 y.o. 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  To briefly review: 11/27/22: Patient was admitted from 12/27/21 to 01/17/22 for vomiting and diarrhea. He developed shock and was transferred to ICU. He was diagnosed with endocarditis. He then developed left arm weakness from the elbow down to hand on the day he was to be discharged. He had numbness and tingling. He describes a gradual weakness over a couple of days. He also describes shooting pains into his arm.   MRI brain on 01/09/22 showed acute right MCA territory ischemic infarcts, thought to be cardioembolic in etiology. Patient denies any other neurologic symptoms in other extremities, face droop, vision changes, speech problems.   He had an MRI of left humerus on 01/15/22 that showed "large heterogeneous intramuscular collection in the anterior compartment of the left upper extremity, measuring up to 7.2 x 5.7 axially and 18.0 cm in craniocaudal extent...suggestive of cellulitis and pyomyositis." This has since resolved after surgery per patient (surgery was 01/2022).   Patient has done PT, last the beginning of 2024. He has seen some improvement but still weak. He has pins like sensation and numbness in the arm. He only gets occasional shooting pains. The pain is worse when he touches the arm and at night. He is not on any nerve pain medications.   Patient is on warfarin and reports taking it daily. He is also on aspirin 81 mg daily per cardiology.   EtOH use: none Restriction diet: No  02/27/23: Patient had EMG and NMUS on 12/23/22. EMG showed evidence of a left median, ulnar, and radial mononeuropathies  perhaps consistent with ischemic monomelic neuropathy. A brachial plexopathy was felt to be less likely given abnormalities were all distal to the elbow. NMUS showed no definite abnormalities but the left median and ulnar nerves traverse the area of old hematoma in left upper arm and are lost to ultrasound in this area.   Per my note from 12/23/22:  I explained the peripheral origin of patient's symptoms and that the nerve injuries appear quite severe. I explained that treatment would include continuing home exercises as there has been very mild improvement. I warned that there is likely to be significant permanent deficits though.   Patient will follow up with me as planned on 02/27/23. I will consider further imaging and neurosurgery consult if warranted if there is no improvement.   Patient had ongoing pain, so gabapentin was increased to 300 mg BID on 02/11/23. Patient has ongoing left hand swelling that is likely contributing to pain. He finds it difficult to move arm due to the swelling and pain. He was started on antibiotics for due to infection in the area of hematoma as well.  Most recent Assessment and Plan (02/27/23): This is Donald [name], a 38 y.o. 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 Contributing to his pain is the significant swelling, likely from blood pooling.   Plan: -Increase  gabapentin to 600 mg BID -Encouraged patient to discuss hand swelling and coldness with PCP - concern for circulation problem -Discussed PT/OT, but given infection, boils, and pain, patient prefers to hold off for now. -Continue Warfarin as prescribed. Patient is not currently taking asa 81 mg per his report. It would not  be needed from a neurologic perspective, but I encouraged patient to speak to cardiology to make sure they knew he wasn't taking it.  Since their last visit: He continues to have pain in the left arm. He does not think the gabapentin is helping much. He is still taking 600 mg BID. He feels like the arm has gotten a little stronger and he feels his arm more. He still has little movement in the left hand.  He is taking Warfarin, not aspirin.   MEDICATIONS:  Outpatient Encounter Medications as of 08/27/2023  Medication Sig Note   amiodarone (PACERONE) 200 MG tablet Take 1 tablet by mouth once daily    digoxin (LANOXIN) 0.125 MG tablet Take 1 tablet (0.125 mg total) by mouth daily.    FARXIGA 10 MG TABS tablet Take 1 tablet by mouth once daily    furosemide (LASIX) 80 MG tablet Take 1 tablet (80 mg total) by mouth daily.    gabapentin (NEURONTIN) 300 MG capsule Take 2 capsules (600 mg total) by mouth 2 (two) times daily.    metformin (FORTAMET) 500 MG (OSM) 24 hr tablet Take 500 mg by mouth daily with breakfast.    pantoprazole (PROTONIX) 40 MG tablet Take 1 tablet (40 mg total) by mouth daily.    potassium chloride SA (KLOR-CON M) 20 MEQ tablet Take 1 tablet (20 mEq total) by mouth daily.    sacubitril-valsartan (ENTRESTO) 24-26 MG Take 1 tablet by mouth 2 (two) times daily.    sertraline (ZOLOFT) 50 MG tablet Take 1 tablet (50 mg total) by mouth daily.    sildenafil (VIAGRA) 50 MG tablet Take 1 tablet (50 mg total) by mouth daily as needed for erectile dysfunction.    spironolactone (ALDACTONE) 25 MG tablet Take 0.5 tablets (12.5 mg total) by mouth daily.    warfarin (COUMADIN) 5 MG tablet Take warfarin 1 - 1 1/2 tablets daily or as directed by coumadin clinic (Patient taking differently: Take 5-7.5 mg by mouth See admin instructions. Take 1 & 1/2 tablets daily except 1 tablet on Mondays, Wednesdays and Fridays) 04/02/2023: On hold until 10.22.24   acetaminophen (TYLENOL) 325 MG tablet Take 2  tablets (650 mg total) by mouth every 4 (four) hours as needed for headache or mild pain. (Patient not taking: Reported on 08/27/2023)    No facility-administered encounter medications on file as of 08/27/2023.    PAST MEDICAL HISTORY: Past Medical History:  Diagnosis Date   Anemia    Autoimmune disorder (HCC)    pyoderma gangrenosum   CHF (congestive heart failure) (HCC)    Chronic systolic heart failure (HCC)    a. EF 35-40% by echo in 07/2018 b. EF at 45% by repeat echo in 04/2020   DVT (deep venous thrombosis) (HCC)    h/o   Dysrhythmia    Hidradenitis suppurativa    Mitral regurgitation    a. 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 b. 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   Mitral stenosis    Myocardial infarction (HCC)    Paroxysmal atrial flutter (HCC)    Pyoderma gangrenosa  Seronegative spondylitis (HCC)    arthritis   Shock, septic and cardiogenic 12/28/2021   Tricuspid regurgitation     PAST SURGICAL HISTORY: Past Surgical History:  Procedure Laterality Date   CARDIAC CATHETERIZATION     HERNIA REPAIR Right 2018   MITRAL VALVE REPAIR  06/07/2018   [facility] - Dr Meda Klinefelter   MITRAL VALVE REPLACEMENT N/A 06/23/2021   MULTIPLE EXTRACTIONS WITH ALVEOLOPLASTY N/A 11/08/2020   Procedure: EXTRACTION OF TEETH NUMBER ONE, FIFTHTEEN, SIXTEEN, SEVENTEEN, EIGHTTEEN AND THRTY WITH ALVEOLOPLASTY OF LOWER LEFT QUADRANT.;  Surgeon: Sharman Cheek, DMD;  Location: [facility] OR;  Service: Dentistry;  Laterality: N/A;   RIGHT/LEFT HEART CATH AND CORONARY ANGIOGRAPHY N/A 09/11/2020   Procedure: RIGHT/LEFT HEART CATH AND CORONARY ANGIOGRAPHY;  Surgeon: Dolores Patty, MD;  Location: [facility] INVASIVE CV LAB;  Service: Cardiovascular;  Laterality: N/A;   TEE WITHOUT CARDIOVERSION N/A 05/23/2020   Procedure: TRANSESOPHAGEAL ECHOCARDIOGRAM (TEE) WITH PROPOFOL;  Surgeon: [name], MD;  Location: [facility] ENDO SUITE;  Service: Endoscopy;  Laterality: N/A;    ALLERGIES: No Known Allergies  FAMILY HISTORY: Family History  Problem Relation Age of Onset   Multiple sclerosis Mother    Psoriasis Mother    Depression Father    Diabetes Father    Diabetes Paternal Grandmother     SOCIAL HISTORY: Social History   Tobacco Use   Smoking status: Former    Current packs/day: 0.00    Types: Cigarettes    Quit date: 10/14/2020    Years since quitting: 2.8   Smokeless tobacco: Never  Vaping Use   Vaping status: Never Used  Substance Use Topics   Alcohol use: No   Drug use: No   Social History   Social History Narrative   Left handed       Are you currently employed ? no   What is your current occupation?   Do you live at home alone?no   Who lives with you? son   What type of home do you live in: 1 story or 2 story? one   Caffeine no     Objective:  Vital Signs:  BP 98/74   Pulse 76   Ht 5\' 9"  (1.753 m)   Wt 250 lb (113.4 kg)   SpO2 96%   BMI 36.92 kg/m   General: General appearance: Awake and alert. No distress. Cooperative with exam.  Skin: No obvious rash or jaundice. HEENT: Atraumatic. Anicteric. Lungs: Non-labored breathing on room air  Extremities: No edema.  Neurological: Mental Status: Alert. Speech fluent. No pseudobulbar affect Cranial Nerves: CNII: No RAPD. Visual fields intact. CNIII, IV, VI: PERRL. No nystagmus. EOMI. CN V: Facial sensation intact bilaterally to fine touch. CN VII: Facial muscles symmetric and strong. No ptosis at rest. CN VIII: Hears finger rub well bilaterally. CN IX: No hypophonia. CN X: Palate elevates symmetrically. CN XI: Full strength shoulder shrug bilaterally. CN XII: Tongue protrusion full and midline. No atrophy or fasciculations. No significant dysarthria Motor: Tone is normal, except flaccid in distal LUE. Atrophy of left forearm and hand muscles.  Individual muscle group testing (MRC  grade out of 5):  Movement     Neck flexion 5    Neck extension 5     Right Left   Shoulder abduction 5 5   Shoulder adduction 5 5   Shoulder ext rotation 5 5   Shoulder int rotation 5 5   Elbow flexion 5 5-   Elbow extension 5 5-  Wrist extension 5 5   Wrist flexion 5 4+   Finger abduction - FDI 5 0   Finger abduction - ADM 5 0   Finger extension 5 1   Finger distal flexion - 2/3 5 4    Finger distal flexion - 4/5 5 4+   Thumb flexion - FPL 5 0   Thumb abduction - APB 5 0    Hip flexion 5 5   Hip extension 5 5   Hip adduction 5 5   Hip abduction 5 5   Knee extension 5 5   Knee flexion 5 5   Dorsiflexion 5 5   Plantarflexion 5 5     Reflexes:  Right Left  Bicep 2+ 2+  Tricep 2+ 2+  BrRad 2+ 0  Knee 2+ 2+  Ankle 2+ 2+   Sensation: Pinprick: Intact in all extremities, including in LUE Gait: Normal, narrow-based gait.   Lab and Test Review: New results: INR (08/13/23): 2.3  07/23/23: CMP significant for Na 134, K 3.4, Cr 1.33, albumin 2.5 CBC significant for Hb 12.0, MCV 79  Previously reviewed results: 02/26/23: CBC significant for Hb 11.9, MCV 77.2, plts 472 BMP significant for Na 131, Cr 1.29 INR 1.9            Lab Results  Component Value Date    HGBA1C 5.6 01/10/2022             Lab Results  Component Value Date    VITAMINB12 2,046 (H) 07/28/2021             Lab Results  Component Value Date    TSH 3.252 09/11/2022      External labs: Lipid panel (04/10/22): TG 49, Cholesterol 134, LDL 71, HDL 53 (wnl)   Imaging: MRI brain wo contrast (01/09/22):  IMPRESSION: 1. Patchy small volume acute ischemic nonhemorrhagic cortical infarcts involving the subcortical right frontoparietal region. These are likely embolic in nature. 2. Underlying mild chronic microvascular ischemic disease, advanced for age. Small remote right cerebellar infarct. 3. Multiple scattered chronic micro hemorrhages involving the cerebellum and both cerebral hemispheres,  nonspecific, but favored to be secondary to poorly controlled hypertension.   CTA head and neck (01/10/22): IMPRESSION: CT head:   1. Known small right frontoparietal lobe acute infarcts were better appreciated on the brain MRI of 01/09/2022. 2. Background mild chronic small vessel image changes within the cerebral white matter. 3. Redemonstrated small chronic infarcts within the right cerebellar hemisphere. 4. Small right mastoid effusion.   CTA neck:   1. The common carotid, internal carotid and vertebral arteries are patent within the neck without stenosis or significant atherosclerotic disease. 2. Partially imaged nonspecific edema/stranding within the left axilla. Correlate clinically and consider dedicated imaging.   CTA head:   1. No intracranial large vessel occlusion or proximal high-grade arterial stenosis. 2. Cystic-appearing lesions within the left temporal and preauricular soft tissues, measuring up to 1.7 cm. Direct visualization recommended.   MRI left humerus w/wo contrast (01/15/22): IMPRESSION: Large heterogeneous intramuscular collection in the anterior compartment of the left upper extremity, measuring up to 7.2 x 5.7 axially and 18.0 cm in craniocaudal extent. Extensive associated soft tissue edema throughout the left upper extremity extending to the axilla and lower neck, and distally beyond the elbow into the forearm. Marked skin thickening medially. Findings are suggestive of cellulitis and pyomyositis. Intrinsic T1 signal and blooming artifact suggests the presence of a hemorrhagic component, and this collection could also simply be a large hematoma.  No evidence of osteomyelitis or acute fracture.   Echo (06/05/22): 1. Left ventricular ejection fraction, by estimation, is 25 to 30%. The  left ventricle has severely decreased function. The left ventricle  demonstrates global hypokinesis. The left ventricular internal cavity size  was mildly  dilated. Left ventricular  diastolic parameters are indeterminate.   2. Right ventricular systolic function is severely reduced. The right  ventricular size is normal. Tricuspid regurgitation signal is inadequate  for assessing PA pressure.   3. Left atrial size was mildly dilated.   4. PHT < 130 ms, elevated TVI ratio elevated in the settiong of low LV  stroke volume (there is no pathologic regurgitation seen by color  Doppler), mean gradient of 6 at a heart rate of 68 bpm reflecting, likely,  a degree of patient prosthesis mismatch..   The mitral valve has been repaired/replaced. No evidence of mitral valve  regurgitation. There is a 31 mm [employer] mechanical valve present in the  mitral position. Procedure Date: 06/10/21.   5. The aortic valve is tricuspid. Aortic valve regurgitation is mild.   6. The inferior vena cava is normal in size with greater than 50%  respiratory variability, suggesting right atrial pressure of 3 mmHg.  EMG (12/23/22): NCV & EMG Findings: Extensive electrodiagnostic evaluation of the left upper limb shows: All sensory responses (median, ulnar, radial, medial antebrachial cutaneous (MAC), and lateral antebrachial cutaneous (LAC)) of the left upper limb are absent. Left median (APB) and ulnar (ADM) motor responses are absent. No motor units with active denervation changes are seen on needle examination of the left first dorsal interosseous and pronator teres muscles. Chronic motor axon loss changes without active denervation changes are seen in the left extensor indicis proprius, brachioradialis, flexor digitorum profundus to digits 4,5, and triceps muscles.   Impression: This is a complex, abnormal study. The findings are most consistent with the following: Evidence of left median, ulnar, and radial mononeuropathies, axon loss in type, severe in degree electrically. This may be consistent with the diagnosis of ischemic monomelic neuropathy, among other  possibilities. While the absent MAC and LAC could be evidence of a diffuse left brachial plexopathy, the normal biceps and deltoid makes #1 above more likely. No electrodiagnostic evidence of a left cervical (C5-C8) motor radiculopathy.   NMUS (12/23/22): Findings: High frequency (4.0-16.0 MHz) B-mode, nonvascular ultrasound of the left upper limb shows: Cross sectional areas (CSA) of the left median (palm to mid upper arm) and left ulnar (wrist to mid upper arm) nerves are within normal limits. Wrist to forearm ratios of left median nerves is within normal limits. There is no subluxation or dislocation of left ulnar nerve from the ulnar groove with maximal elbow flexion. Hyper-echogenicity of most muscles of the left upper limb.    Impression: This neuromuscular ultrasound does not show any definitive abnormalities of the left median or ulnar nerves. However, there this significant hyper-echogenicity of most muscles of the arm (distal more so than proximal) that is consistent with denervation changes. In addition, both the median and ulnar nerves' paths crosses through area of old hematoma as designated by scar in left upper arm. There is no obvious compression, though both nerves are lost to ultrasound in this area.  ASSESSMENT: This is Donald [name], a 38 y.o. 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 Also has afib Left arm weakness, numbness, tingling - while initially thought to be due to stroke, EMG confirmed suspicion that etiology was peripheral nervous system  and consistent with ischemic monomelic neuropathy, likely secondary to the hematoma in the left upper arm. Patient has had improvement to sensation and mild improvement to strength, but still has significant tingling and pain in LUE. Gabapentin has not helped.  Plan: -Wean off gabapentin: 300/600 for 2 days, then 300/300 for 2 days, then 300 mg at night for 2 days, then stop -Start Lyrica after  stopping gabapentin: 25 mg BID for 1 week then 50 mg BID thereafter -Patient agreeable to occupational therapy today -Continue warfarin for afib for secondary stroke prevention  Return to clinic in 6 months   Jacquelyne Balint, MD

## 2023-08-27 ENCOUNTER — Encounter: Payer: Self-pay | Admitting: Neurology

## 2023-08-27 ENCOUNTER — Ambulatory Visit: Payer: Medicaid Other | Admitting: Neurology

## 2023-08-27 VITALS — BP 98/74 | HR 76 | Ht 69.0 in | Wt 250.0 lb

## 2023-08-27 DIAGNOSIS — M79602 Pain in left arm: Secondary | ICD-10-CM

## 2023-08-27 DIAGNOSIS — R200 Anesthesia of skin: Secondary | ICD-10-CM

## 2023-08-27 DIAGNOSIS — T826XXA Infection and inflammatory reaction due to cardiac valve prosthesis, initial encounter: Secondary | ICD-10-CM | POA: Diagnosis not present

## 2023-08-27 DIAGNOSIS — S40022D Contusion of left upper arm, subsequent encounter: Secondary | ICD-10-CM | POA: Diagnosis not present

## 2023-08-27 DIAGNOSIS — I38 Endocarditis, valve unspecified: Secondary | ICD-10-CM | POA: Diagnosis not present

## 2023-08-27 DIAGNOSIS — G128 Other spinal muscular atrophies and related syndromes: Secondary | ICD-10-CM | POA: Diagnosis not present

## 2023-08-27 DIAGNOSIS — R29898 Other symptoms and signs involving the musculoskeletal system: Secondary | ICD-10-CM | POA: Diagnosis not present

## 2023-08-27 DIAGNOSIS — R202 Paresthesia of skin: Secondary | ICD-10-CM

## 2023-08-27 DIAGNOSIS — I63411 Cerebral infarction due to embolism of right middle cerebral artery: Secondary | ICD-10-CM | POA: Diagnosis not present

## 2023-08-27 MED ORDER — PREGABALIN 25 MG PO CAPS
50.0000 mg | ORAL_CAPSULE | Freq: Two times a day (BID) | ORAL | 5 refills | Status: DC
Start: 2023-08-27 — End: 2024-01-20

## 2023-08-27 NOTE — Patient Instructions (Addendum)
-  Wean off gabapentin: 300/600 for 2 days, then 300/300 for 2 days, then 300 mg at night for 2 days, then stop  -Start Lyrica after stopping gabapentin: 25 mg BID for 1 week then 50 mg BID thereafter  I am referring you to occupational therapy to help improve hand function.  I will see you back in clinic in 6 months. Please let me know if you have any questions or concerns in the meantime.  The physicians and staff at [facility] are committed to providing excellent care. You may receive a survey requesting feedback about your experience at our office. We strive to receive "very good" responses to the survey questions. If you feel that your experience would prevent you from giving the office a "very good " response, please contact our office to try to remedy the situation. We may be reached at [phone]. Thank you for taking the time out of your busy day to complete the survey.  Jacquelyne Balint, MD [facility]

## 2023-08-31 NOTE — Addendum Note (Signed)
 Encounter addended by: Howell Rucks, RDCS on: 08/31/2023 7:16 AM  Actions taken: Imaging Exam ended

## 2023-09-03 IMAGING — DX DG ANKLE 2V *R*
2 series · 2 of 2 positions shown · non-contrast
Comparison: None.

CLINICAL DATA: Ankle pain

EXAM:
RIGHT ANKLE - 2 VIEW

[ankle ap]
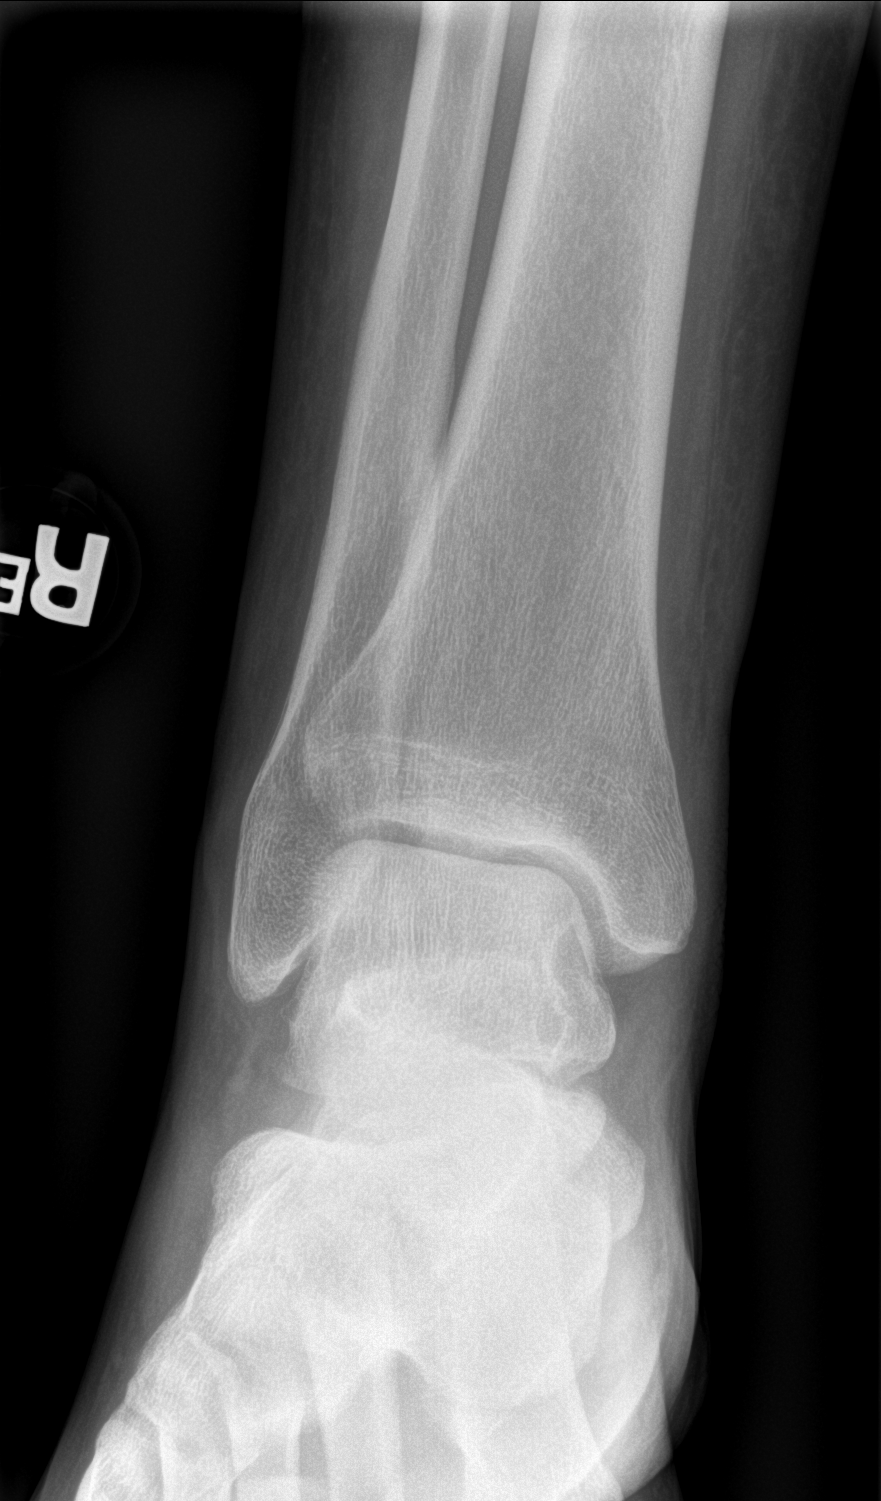

[ankle lat]
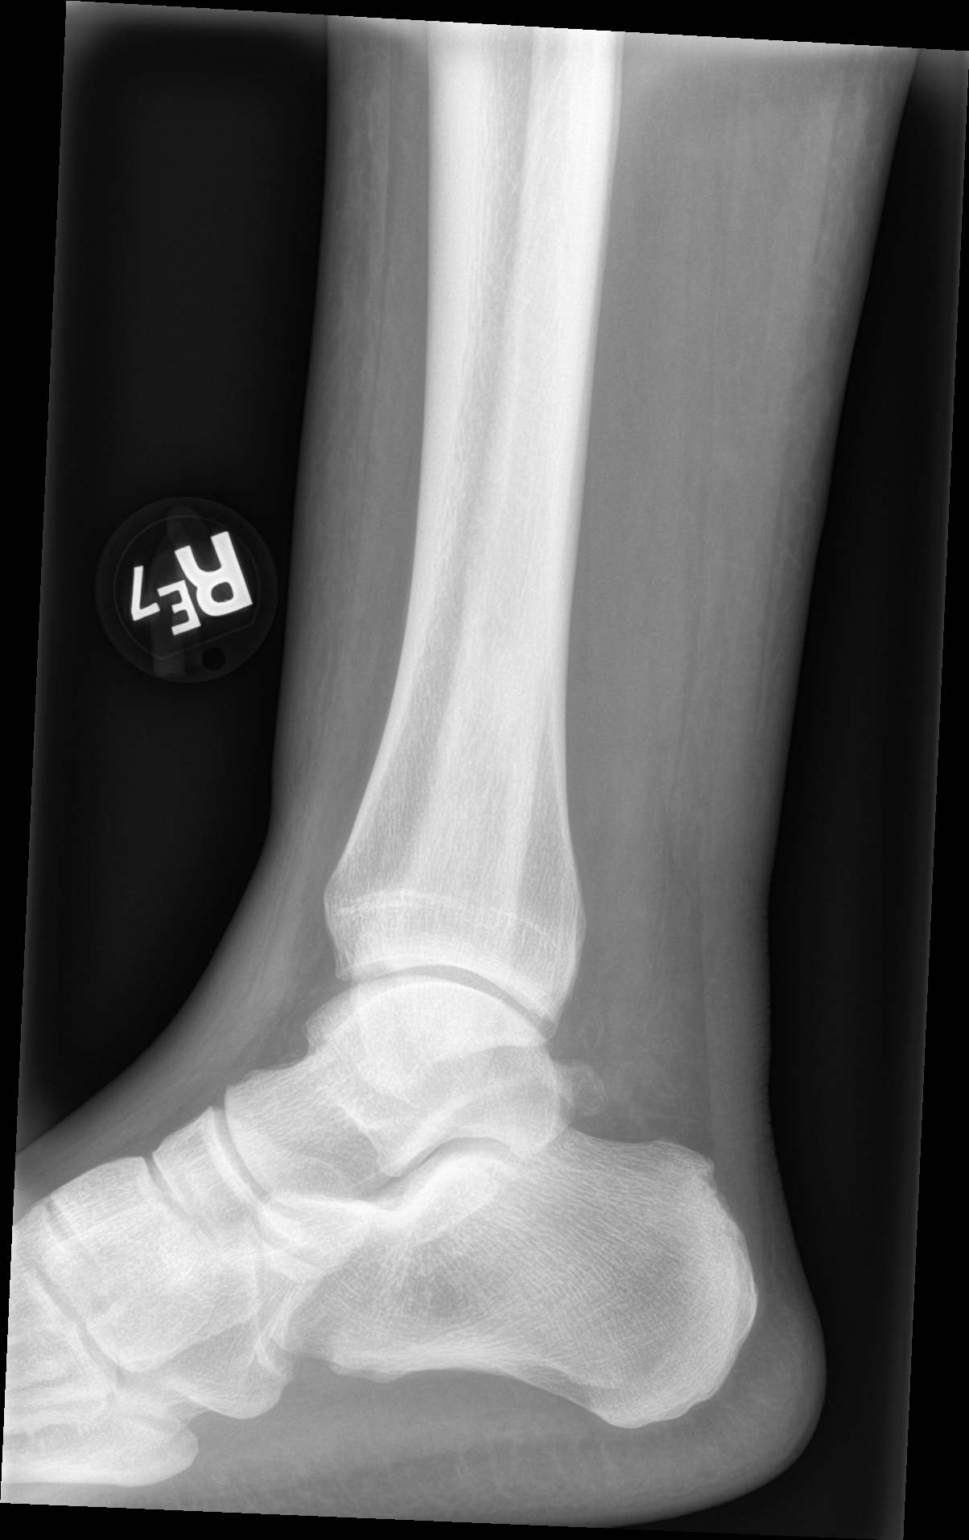

[2 of 2 positions shown; findings below may reference images not displayed]

FINDINGS: There is no evidence of fracture, dislocation, or joint effusion.
There is no evidence of arthropathy or other focal bone abnormality.
Os trigonum or prominent Stieda process. Soft tissues are
unremarkable.
IMPRESSION: No evidence of fracture or dislocation.  No significant arthropathy.

## 2023-09-08 ENCOUNTER — Ambulatory Visit: Attending: Cardiology | Admitting: *Deleted

## 2023-09-08 DIAGNOSIS — Z952 Presence of prosthetic heart valve: Secondary | ICD-10-CM | POA: Diagnosis not present

## 2023-09-08 DIAGNOSIS — Z5181 Encounter for therapeutic drug level monitoring: Secondary | ICD-10-CM

## 2023-09-08 DIAGNOSIS — I480 Paroxysmal atrial fibrillation: Secondary | ICD-10-CM | POA: Diagnosis not present

## 2023-09-08 DIAGNOSIS — I6349 Cerebral infarction due to embolism of other cerebral artery: Secondary | ICD-10-CM | POA: Diagnosis not present

## 2023-09-08 LAB — POCT INR: INR: 3.5 — AB (ref 2.0–3.0)

## 2023-09-08 NOTE — Patient Instructions (Signed)
Continue warfarin 1 tablet daily Recheck in 4 wks 

## 2023-09-14 ENCOUNTER — Ambulatory Visit (HOSPITAL_COMMUNITY): Admitting: Occupational Therapy

## 2023-09-16 DIAGNOSIS — L88 Pyoderma gangrenosum: Secondary | ICD-10-CM | POA: Diagnosis not present

## 2023-09-16 DIAGNOSIS — L02419 Cutaneous abscess of limb, unspecified: Secondary | ICD-10-CM | POA: Diagnosis not present

## 2023-09-16 DIAGNOSIS — L732 Hidradenitis suppurativa: Secondary | ICD-10-CM | POA: Diagnosis not present

## 2023-09-22 ENCOUNTER — Ambulatory Visit (HOSPITAL_COMMUNITY): Attending: Neurology | Admitting: Occupational Therapy

## 2023-09-22 ENCOUNTER — Other Ambulatory Visit: Payer: Self-pay

## 2023-09-22 ENCOUNTER — Encounter (HOSPITAL_COMMUNITY): Payer: Self-pay | Admitting: Occupational Therapy

## 2023-09-22 DIAGNOSIS — M79602 Pain in left arm: Secondary | ICD-10-CM | POA: Diagnosis not present

## 2023-09-22 DIAGNOSIS — R29898 Other symptoms and signs involving the musculoskeletal system: Secondary | ICD-10-CM | POA: Diagnosis not present

## 2023-09-22 DIAGNOSIS — G128 Other spinal muscular atrophies and related syndromes: Secondary | ICD-10-CM | POA: Insufficient documentation

## 2023-09-22 DIAGNOSIS — R278 Other lack of coordination: Secondary | ICD-10-CM | POA: Insufficient documentation

## 2023-09-22 DIAGNOSIS — R29818 Other symptoms and signs involving the nervous system: Secondary | ICD-10-CM | POA: Insufficient documentation

## 2023-09-22 DIAGNOSIS — S40022D Contusion of left upper arm, subsequent encounter: Secondary | ICD-10-CM | POA: Diagnosis not present

## 2023-09-22 NOTE — Patient Instructions (Signed)
 Weight-bearing for Shoulder Subluxation   1) Weight-bearing lean: From a seated position on your bed or bench, prop yourself up on your affected arm by placing your affected arm about a foot away from your body. Then lean down onto your elbow and forearm. Hold for 30 seconds to 1 minute.  Complete 3x per set, complete 3 sets per day.    Self-AAROM Wrist Exercises  1) Forearm Pronation/Supination      1. Interlock your fingers with the affected thumb on top. Hold your wrist to support the affected arm 2. Place the affected arm with the palm of your hand facing upward. 3. Slowly rotate the palm of your hand downward. 4. Repeat 10 times.   2) Wrist Flexion/Extension                1. Interlock your fingers with the affected thumb on top. Grasp your affected hand 2. Slowly bend your wrist forward then backward. 3. Repeat 10 times.            3) Wrist Ulnar/Radial Deviation         1. Interlock your fingers with the affected thumb on top. Grasp your affected hand 2. Slowly bend your wrist towards you then away from you. 3. Repeat 10 times.

## 2023-09-22 NOTE — Therapy (Unsigned)
 OUTPATIENT OCCUPATIONAL THERAPY NEURO EVALUATION  Patient Name: Donald [name] MRN: [id number] DOB:09-Feb-1986, 38 y.o., male Today's Date: 09/22/2023    END OF SESSION:  OT End of Session - 09/22/23 1417     Visit Number 1    Number of Visits 12    Date for OT Re-Evaluation 11/21/23    Authorization Type [employer]    Authorization Time Period Requesting 12 visits    Authorization - Visit Number 0    Authorization - Number of Visits 12    OT Start Time 1105    OT Stop Time 1145    OT Time Calculation (min) 40 min    Activity Tolerance Patient tolerated treatment well    Behavior During Therapy WFL for tasks assessed/performed             Past Medical History:  Diagnosis Date   Anemia    Autoimmune disorder (HCC)    pyoderma gangrenosum   CHF (congestive heart failure) (HCC)    Chronic systolic heart failure (HCC)    a. EF 35-40% by echo in 07/2018 b. EF at 45% by repeat echo in 04/2020   DVT (deep venous thrombosis) (HCC)    h/o   Dysrhythmia    Hidradenitis suppurativa    Mitral regurgitation    a. 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 b. 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HCC)    arthritis   Shock, septic and cardiogenic 12/28/2021   Tricuspid regurgitation    Past Surgical History:  Procedure Laterality Date   CARDIAC CATHETERIZATION     HERNIA REPAIR Right 2018   MITRAL VALVE REPAIR  06/07/2018   [facility] - Dr Meda Klinefelter   MITRAL VALVE REPLACEMENT N/A 06/23/2021   MULTIPLE EXTRACTIONS WITH ALVEOLOPLASTY N/A 11/08/2020   Procedure: EXTRACTION OF TEETH NUMBER ONE, FIFTHTEEN, SIXTEEN, SEVENTEEN, EIGHTTEEN AND THRTY WITH ALVEOLOPLASTY OF LOWER LEFT QUADRANT.;  Surgeon: Sharman Cheek, DMD;   Location: [facility] OR;  Service: Dentistry;  Laterality: N/A;   RIGHT/LEFT HEART CATH AND CORONARY ANGIOGRAPHY N/A 09/11/2020   Procedure: RIGHT/LEFT HEART CATH AND CORONARY ANGIOGRAPHY;  Surgeon: Dolores Patty, MD;  Location: [facility] INVASIVE CV LAB;  Service: Cardiovascular;  Laterality: N/A;   TEE WITHOUT CARDIOVERSION N/A 05/23/2020   Procedure: TRANSESOPHAGEAL ECHOCARDIOGRAM (TEE) WITH PROPOFOL;  Surgeon: [name], MD;  Location: [facility];  Service: Endoscopy;  Laterality: N/A;   Patient Active Problem List   Diagnosis Date Noted   Acute deep vein thrombosis (DVT) of left upper extremity (HCC) 04/02/2023   Anemia 03/12/2022   Nausea 03/12/2022   Hematoma of arm, left, subsequent encounter 03/12/2022   Hypomagnesemia 01/12/2022   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   Left arm pain and swelling 01/10/2022   Cardiomyopathy (HCC)    Paroxysmal atrial fibrillation (HCC) 01/06/2022   Endocarditis of prosthetic mitral valve (HCC)    AKI (acute kidney injury) (HCC)    Acute febrile illness    Anticoagulated on Coumadin    Dehydration    Elevated lactic acid level    Nausea vomiting and diarrhea    Atrial fibrillation with RVR (HCC) 08/03/2021   Sacral wound 08/03/2021   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   Shock liver 08/03/2021   Upper GI bleed  08/03/2021   Iron deficiency anemia 08/03/2021   Hyponatremia 08/03/2021   Cardiogenic shock (HCC)    Acute hypoxemic respiratory failure (HCC) 07/24/2021   Encounter for central line placement    H/O mitral valve replacement 06/20/2021   Encounter for therapeutic drug monitoring 06/20/2021   Chronic heart failure (HCC) 05/30/2021   Acute respiratory failure with hypoxia (HCC) 05/30/2021   Hypokalemia 05/30/2021   Elevated brain natriuretic peptide (BNP) level 05/30/2021   Elevated troponin 05/30/2021   Elevated serum creatinine 05/30/2021   Hypoalbuminemia due to protein-calorie malnutrition (HCC) 05/30/2021   Prolonged QT interval  05/30/2021   Transaminitis 05/30/2021   Paroxysmal atrial flutter (HCC) 05/30/2021   Acute systolic CHF (congestive heart failure) (HCC) 05/30/2021   Acute on chronic systolic and diastolic heart failure, NYHA class 3 (HCC) 05/30/2021   Caries    Chronic periodontitis    Retained dental root    Tricuspid regurgitation    Mitral regurgitation    Acute on chronic HFrEF (heart failure with reduced ejection fraction) (HCC)    Autoimmune disorder (HCC)    Seronegative spondylitis (HCC)    PCP: Colvin Caroli, FNP  REFERRING PROVIDER: Dr. 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 Pt accompanied by: self  PERTINENT HISTORY: Pt is a 38 y/o male  PRECAUTIONS: None  WEIGHT BEARING RESTRICTIONS: No  PAIN:  Are you having pain? Yes: NPRS scale: 5/10 Pain location: elbow down on LUE Pain description: heavy and aching Aggravating factors: unsure Relieving factors: medication  FALLS: Has patient fallen in last 6 months? No  PLOF: Independent  PATIENT GOALS: To have use of the LUE  OBJECTIVE:  Note: Objective measures were completed at Evaluation unless otherwise noted.  HAND DOMINANCE: Right  ADLs: Overall ADLs: Pt is able to use the left shoulder, unable to use the LUE functionally for ADLs. Has edema in the LUE. Pt is using RUE for all tasks.    FUNCTIONAL OUTCOME MEASURES: Upper Extremity Functional Scale (UEFS): 2/80  UPPER EXTREMITY ROM:      Assessed in sitting, er/IR adducted  Active  ROM Left eval  Shoulder flexion 80  Shoulder abduction 66  Shoulder internal rotation 90  Shoulder external rotation 32  Elbow flexion 115  Elbow extension -34  Wrist flexion 10  Wrist extension 20  Wrist ulnar deviation 0  Wrist radial deviation 0  Wrist pronation 2  Wrist supination 0  (Blank rows = not tested)  UPPER EXTREMITY MMT:       Assessed via observation due to pain and weakness limiting MMT  MMT Left eval  Shoulder flexion 3-/5  Shoulder abduction 3-/5  Shoulder internal rotation 3/5  Shoulder external rotation 3-/5  Elbow flexion 3-/5  Elbow extension 3/5  Wrist flexion 2-/5  Wrist extension 2-/5  Wrist ulnar deviation 1/5  Wrist radial deviation 1/5  Wrist pronation 2-/5  Wrist supination 2-/5  (Blank rows = not tested)  HAND FUNCTION: Unable to test  COORDINATION: Unable to test  SENSATION:  Semmes Weinstein monofilament testing: volar and dorsal forearm at 2.83-3.22; wrist and dorsal hand 3.61, palm and digits at 4.56-6.65-loss of protective sensation   EDEMA: generalized edema in hand and digits  COGNITION: Overall cognitive status: Within functional limits for tasks assessed  OBSERVATIONS: Pt with generalized edema, hand is very cold to the touch. Pt reports this is his baseline. Can wiggle the 3rd-5th digits.                                                                                                                             TREATMENT DATE:         PATIENT EDUCATION: Education details: weightbearing on forearm, wrist self-ROM Person educated: Patient Education method: Explanation, Demonstration, and Handouts Education comprehension: verbalized understanding and returned demonstration  HOME EXERCISE PROGRAM: 09/22/23: weightbearing on forearm, wrist self-ROM   GOALS: Goals reviewed with patient? Yes  SHORT TERM GOALS: Target date: 10/22/23  Pt will be provided with and educated on HEP to improve functional use of the LUE  during ADLs.   Goal status: INITIAL  2.  *** Baseline:  Goal status: INITIAL  3.  *** Baseline:  Goal status: INITIAL  4.  *** Baseline:  Goal status: INITIAL  5.  *** Baseline:  Goal status: INITIAL  6.  *** Baseline:  Goal status: INITIAL  LONG TERM GOALS: Target date: ***  *** Baseline:  Goal status: INITIAL  2.  *** Baseline:  Goal status: INITIAL  3.  *** Baseline:  Goal status: INITIAL  4.  *** Baseline:  Goal status: INITIAL  5.  *** Baseline:  Goal status: INITIAL  6.  *** Baseline:  Goal status: INITIAL  ASSESSMENT:  CLINICAL IMPRESSION: Patient is a *** y.o. *** who was seen today for occupational therapy evaluation for ***.   PERFORMANCE DEFICITS: in functional skills including {OT physical skills:25468}, cognitive skills including {OT cognitive skills:25469}, and psychosocial skills including {OT psychosocial skills:25470}.   IMPAIRMENTS: are limiting patient from {OT performance deficits:25471}.   CO-MORBIDITIES: {Comorbidities:25485} that affects occupational performance. Patient will benefit from skilled OT to address above impairments and improve overall function.  MODIFICATION OR ASSISTANCE TO COMPLETE EVALUATION: {OT modification:25474}  OT OCCUPATIONAL PROFILE AND HISTORY: {OT PROFILE AND HISTORY:25484}  CLINICAL DECISION MAKING: {OT CDM:25475}  REHAB POTENTIAL: {rehabpotential:25112}  EVALUATION COMPLEXITY: {Evaluation complexity:25115}    PLAN:  OT FREQUENCY: {rehab frequency:25116}  OT DURATION: {rehab duration:25117}  PLANNED INTERVENTIONS: {OT Interventions:25467}  RECOMMENDED OTHER SERVICES: ***  CONSULTED AND AGREED WITH PLAN OF CARE: {ZOX:09604}  PLAN FOR NEXT SESSION: Ezra Sites, OTR/L  [phone] 09/22/2023, 2:18 PM

## 2023-10-02 ENCOUNTER — Ambulatory Visit (HOSPITAL_COMMUNITY): Admitting: Occupational Therapy

## 2023-10-02 ENCOUNTER — Encounter (HOSPITAL_COMMUNITY): Payer: Self-pay | Admitting: Occupational Therapy

## 2023-10-02 DIAGNOSIS — R29818 Other symptoms and signs involving the nervous system: Secondary | ICD-10-CM | POA: Diagnosis not present

## 2023-10-02 DIAGNOSIS — R29898 Other symptoms and signs involving the musculoskeletal system: Secondary | ICD-10-CM | POA: Diagnosis not present

## 2023-10-02 DIAGNOSIS — M79602 Pain in left arm: Secondary | ICD-10-CM

## 2023-10-02 DIAGNOSIS — R278 Other lack of coordination: Secondary | ICD-10-CM | POA: Diagnosis not present

## 2023-10-02 DIAGNOSIS — G128 Other spinal muscular atrophies and related syndromes: Secondary | ICD-10-CM | POA: Diagnosis not present

## 2023-10-02 DIAGNOSIS — S40022D Contusion of left upper arm, subsequent encounter: Secondary | ICD-10-CM | POA: Diagnosis not present

## 2023-10-02 NOTE — Therapy (Signed)
 OUTPATIENT OCCUPATIONAL THERAPY NEURO TREATMENT  Patient Name: Donald [name] MRN: [id number] DOB:May 08, 1986, 38 y.o., male Today's Date: 10/02/2023    END OF SESSION:  OT End of Session - 10/02/23 1235     Visit Number 2    Number of Visits 12    Date for OT Re-Evaluation 11/21/23    Authorization Type [employer]    Authorization Time Period 4/18-5/17    Authorization - Visit Number 1    Authorization - Number of Visits 4    OT Start Time 1155    OT Stop Time 1230    OT Time Calculation (min) 35 min    Activity Tolerance Patient tolerated treatment well    Behavior During Therapy WFL for tasks assessed/performed              Past Medical History:  Diagnosis Date   Anemia    Autoimmune disorder (HCC)    pyoderma gangrenosum   CHF (congestive heart failure) (HCC)    Chronic systolic heart failure (HCC)    a. EF 35-40% by echo in 07/2018 b. EF at 45% by repeat echo in 04/2020   DVT (deep venous thrombosis) (HCC)    h/o   Dysrhythmia    Hidradenitis suppurativa    Mitral regurgitation    a. 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 b. 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HCC)    arthritis   Shock, septic and cardiogenic 12/28/2021   Tricuspid regurgitation    Past Surgical History:  Procedure Laterality Date   CARDIAC CATHETERIZATION     HERNIA REPAIR Right 2018   MITRAL VALVE REPAIR  06/07/2018   [facility] - Dr Lamar Server   MITRAL VALVE REPLACEMENT N/A 06/23/2021   MULTIPLE EXTRACTIONS WITH ALVEOLOPLASTY N/A 11/08/2020   Procedure: EXTRACTION OF TEETH NUMBER ONE, FIFTHTEEN, SIXTEEN, SEVENTEEN, EIGHTTEEN AND THRTY WITH ALVEOLOPLASTY OF LOWER LEFT QUADRANT.;  Surgeon: Celena Lum NOVAK, DMD;  Location:  [facility] OR;  Service: Dentistry;  Laterality: N/A;   RIGHT/LEFT HEART CATH AND CORONARY ANGIOGRAPHY N/A 09/11/2020   Procedure: RIGHT/LEFT HEART CATH AND CORONARY ANGIOGRAPHY;  Surgeon: Cherrie Toribio JONELLE, MD;  Location: [facility] INVASIVE CV LAB;  Service: Cardiovascular;  Laterality: N/A;   TEE WITHOUT CARDIOVERSION N/A 05/23/2020   Procedure: TRANSESOPHAGEAL ECHOCARDIOGRAM (TEE) WITH PROPOFOL ;  Surgeon: Alvan Dorn FALCON, MD;  Location: [facility];  Service: Endoscopy;  Laterality: N/A;   Patient Active Problem List   Diagnosis Date Noted   Acute deep vein thrombosis (DVT) of left upper extremity (HCC) 04/02/2023   Anemia 03/12/2022   Nausea 03/12/2022   Hematoma of arm, left, subsequent encounter 03/12/2022   Hypomagnesemia 01/12/2022   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   Left arm pain and swelling 01/10/2022   Cardiomyopathy (HCC)    Paroxysmal atrial fibrillation (HCC) 01/06/2022   Endocarditis of prosthetic mitral valve (HCC)    AKI (acute kidney injury) (HCC)    Acute febrile illness    Anticoagulated on Coumadin     Dehydration    Elevated lactic acid level    Nausea vomiting and diarrhea    Atrial fibrillation with RVR (HCC) 08/03/2021   Sacral wound 08/03/2021   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   Shock liver 08/03/2021   Upper GI bleed 08/03/2021  Iron deficiency anemia 08/03/2021   Hyponatremia 08/03/2021   Cardiogenic shock (HCC)    Acute hypoxemic respiratory failure (HCC) 07/24/2021   Encounter for central line placement    H/O mitral valve replacement 06/20/2021   Encounter for therapeutic drug monitoring 06/20/2021   Chronic heart failure (HCC) 05/30/2021   Acute respiratory failure with hypoxia (HCC) 05/30/2021   Hypokalemia 05/30/2021   Elevated brain natriuretic peptide (BNP) level 05/30/2021   Elevated troponin 05/30/2021   Elevated serum creatinine 05/30/2021   Hypoalbuminemia due to protein-calorie malnutrition (HCC) 05/30/2021   Prolonged QT interval  05/30/2021   Transaminitis 05/30/2021   Paroxysmal atrial flutter (HCC) 05/30/2021   Acute systolic CHF (congestive heart failure) (HCC) 05/30/2021   Acute on chronic systolic and diastolic heart failure, NYHA class 3 (HCC) 05/30/2021   Caries    Chronic periodontitis    Retained dental root    Tricuspid regurgitation    Mitral regurgitation    Acute on chronic HFrEF (heart failure with reduced ejection fraction) (HCC)    Autoimmune disorder (HCC)    Seronegative spondylitis (HCC)    PCP: Silvio Ramp, FNP  REFERRING PROVIDER: Dr. 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 Pt accompanied by: self  PERTINENT HISTORY: Pt is a 38 y/o male presenting with LUE weakness present for approximately 2 years. Weakness began with hematoma on the nerve, since November pt reports improvement in sensation and now has movement in digits 3-5. 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 A brachial plexopathy was felt to be less likely given abnormalities were all distal to the elbow. NMUS showed no definite abnormalities but the left median and ulnar nerves traverse the area of old hematoma in left upper arm and are lost to ultrasound in this area.   PRECAUTIONS: None  WEIGHT BEARING RESTRICTIONS: No  PAIN:  Are you having pain? Yes: NPRS scale: 5/10 Pain location: elbow down on LUE Pain description: heavy and aching Aggravating factors: unsure Relieving factors:  medication  FALLS: Has patient fallen in last 6 months? No  PLOF: Independent  PATIENT GOALS: To have use of the LUE  OBJECTIVE:  Note: Objective measures were completed at Evaluation unless otherwise noted.  HAND DOMINANCE: Right  ADLs: Overall ADLs: Pt is able to use the left shoulder for reaching, unable to use the LUE functionally for ADLs. Has edema in the LUE. Pt is unable to pronate or supinate the left forearm/wrist/hand, is positioned in neutral. Pt is unable to grasp objects or make a fist, cannot use the left hand functionally. Pt is using RUE for all tasks.    FUNCTIONAL OUTCOME MEASURES: Upper Extremity Functional Scale (UEFS): 2/80  UPPER EXTREMITY ROM:      Assessed in sitting, er/IR adducted  Active ROM Left eval  Shoulder flexion 80  Shoulder abduction 66  Shoulder internal rotation 90  Shoulder external rotation 32  Elbow flexion 115  Elbow extension -34  Wrist flexion 10  Wrist extension 20  Wrist ulnar deviation 0  Wrist  radial deviation 0  Wrist pronation 2  Wrist supination 0  (Blank rows = not tested)  UPPER EXTREMITY MMT:       Assessed via observation due to pain and weakness limiting MMT  MMT Left eval  Shoulder flexion 3-/5  Shoulder abduction 3-/5  Shoulder internal rotation 3/5  Shoulder external rotation 3-/5  Elbow flexion 3-/5  Elbow extension 3/5  Wrist flexion 2-/5  Wrist extension 2-/5  Wrist ulnar deviation 1/5  Wrist radial deviation 1/5  Wrist pronation 2-/5  Wrist supination 2-/5  (Blank rows = not tested)  HAND FUNCTION: Unable to test  COORDINATION: Unable to test  SENSATION: Semmes Weinstein monofilament testing: volar and dorsal forearm at 2.83-3.22; wrist and dorsal hand 3.61, palm and digits at 4.56-6.65-loss of protective sensation   EDEMA: generalized edema in hand and digits  COGNITION: Overall cognitive status: Within functional limits for tasks assessed  OBSERVATIONS: Pt with generalized edema,  hand is very cold to the touch. Pt reports this is his baseline. Can wiggle the 3rd-5th digits.                                                                                                                             TREATMENT DATE:    10/02/23 - Edema management: massage distal to proximal to L hand/wrist - P/ROM hand: isolated digit flexion 10x each digit, composite digit flexion 10x, supination/pronation 10x - A/ROM: wrist flexion/extension 10x on wedge  - P/ROM shoulder: supine; flexion, abduction, internal/external rotation 10x  - AA/ROM: supine, hand secured to pvc pipe; flexion, retraction 10x each      PATIENT EDUCATION: Education details: weightbearing on forearm, wrist self-ROM Person educated: Patient Education method: Programmer, Multimedia, Demonstration, and Handouts Education comprehension: verbalized understanding and returned demonstration  HOME EXERCISE PROGRAM: 09/22/23: weightbearing on forearm, wrist self-ROM   GOALS: Goals reviewed with patient? Yes  SHORT TERM GOALS: Target date: 10/22/23  Pt will be provided with and educated on HEP to improve functional use of the LUE during ADLs.   Goal status: INITIAL  2.  Pt will be educated on edema management strategies to decrease edema and improve fluid mobility in the left hand.    Goal status: INITIAL  3.  Pt will be demonstrate independence in weightbearing strategies to improve mobility and progress nerve healing in the LUE.   Goal status: INITIAL    LONG TERM GOALS: Target date: 11/21/23  Pt will increase LUE A/ROM by at least 20 degrees to improve mobility required for use as assist during dressing tasks.   Goal status: INITIAL  2.  Pt will increase LUE strength to 3/5 or greater to improve ability to perform functional reaching during bathing and grooming tasks.   Goal status: INITIAL  3.  Pt will increase A/ROM of digits to make a 50% fist or greater, to improve ability to use left hand to support  lightweight objects when carrying.  Goal status: INITIAL  4.  Pt will decrease pain in LUE to 3/10 or less to improve ability to sleep for 2+ hours without waking due to pain.  Goal status: INITIAL  5.  Pt will be educated on available AE and DME for use to improve independence and success with ADLs.   Goal status: INITIAL    ASSESSMENT:  CLINICAL IMPRESSION: Pt reports that his edema has went down and that he wore the edema glove for 4 days, has not worn it since swelling has went down. Began session with edema massage to L hand. Started P/ROM in hand, wrist and shoulder. Rest breaks given throughout due to pain.   PERFORMANCE DEFICITS: in functional skills including ADLs, IADLs, coordination, dexterity, proprioception, sensation, edema, ROM, strength, pain, flexibility, Fine motor control, Gross motor control, and UE functional use  IMPAIRMENTS: are limiting patient from ADLs, IADLs, rest and sleep, work, and leisure.   CO-MORBIDITIES: may have co-morbidities  that affects occupational performance. Patient will benefit from skilled OT to address above impairments and improve overall function.  MODIFICATION OR ASSISTANCE TO COMPLETE EVALUATION: Min-Moderate modification of tasks or assist with assess necessary to complete an evaluation.  OT OCCUPATIONAL PROFILE AND HISTORY: Detailed assessment: Review of records and additional review of physical, cognitive, psychosocial history related to current functional performance.  CLINICAL DECISION MAKING: Moderate - several treatment options, min-mod task modification necessary  REHAB POTENTIAL: Good  EVALUATION COMPLEXITY: Low    PLAN:  OT FREQUENCY:  1x/week for 4 weeks then increase to 2x/week  OT DURATION: 8 weeks  PLANNED INTERVENTIONS: 97168 OT Re-evaluation, 97535 self care/ADL training, 02889 therapeutic exercise, 97530 therapeutic activity, 97112 neuromuscular re-education, 97140 manual therapy, 97035 ultrasound, 97018  paraffin, 02989 moist heat, 97032 electrical stimulation (manual), 97760 Orthotics management and training, 02239 Splinting (initial encounter), [id number] Subsequent splinting/medication, passive range of motion, patient/family education, and DME and/or AE instructions  RECOMMENDED OTHER SERVICES: None at this time  CONSULTED AND AGREED WITH PLAN OF CARE: Patient  PLAN FOR NEXT SESSION: Follow up on HEP and edema glove wear. Weightbearing, edema management techniques, A/ROM   Chiquita Sermon, OTR/L  [phone] 10/02/2023, 12:37 PM

## 2023-10-06 ENCOUNTER — Ambulatory Visit: Attending: Cardiology | Admitting: *Deleted

## 2023-10-06 DIAGNOSIS — I480 Paroxysmal atrial fibrillation: Secondary | ICD-10-CM

## 2023-10-06 DIAGNOSIS — Z5181 Encounter for therapeutic drug level monitoring: Secondary | ICD-10-CM

## 2023-10-06 DIAGNOSIS — Z952 Presence of prosthetic heart valve: Secondary | ICD-10-CM | POA: Diagnosis not present

## 2023-10-06 DIAGNOSIS — I6349 Cerebral infarction due to embolism of other cerebral artery: Secondary | ICD-10-CM | POA: Diagnosis not present

## 2023-10-06 LAB — POCT INR: INR: 5.4 — AB (ref 2.0–3.0)

## 2023-10-06 NOTE — Patient Instructions (Signed)
 Took Abx x 10 days for skin rash.  Also been taking nyquil. Hold warfarin tonight and tomorrow night then resume 1 tablet daily Recheck in 2 wks

## 2023-10-09 ENCOUNTER — Encounter (HOSPITAL_COMMUNITY): Payer: Self-pay | Admitting: Occupational Therapy

## 2023-10-09 ENCOUNTER — Ambulatory Visit (HOSPITAL_COMMUNITY): Admitting: Occupational Therapy

## 2023-10-09 DIAGNOSIS — M79602 Pain in left arm: Secondary | ICD-10-CM

## 2023-10-09 DIAGNOSIS — G128 Other spinal muscular atrophies and related syndromes: Secondary | ICD-10-CM | POA: Diagnosis not present

## 2023-10-09 DIAGNOSIS — R29818 Other symptoms and signs involving the nervous system: Secondary | ICD-10-CM

## 2023-10-09 DIAGNOSIS — S40022D Contusion of left upper arm, subsequent encounter: Secondary | ICD-10-CM | POA: Diagnosis not present

## 2023-10-09 DIAGNOSIS — R278 Other lack of coordination: Secondary | ICD-10-CM | POA: Diagnosis not present

## 2023-10-09 DIAGNOSIS — R29898 Other symptoms and signs involving the musculoskeletal system: Secondary | ICD-10-CM | POA: Diagnosis not present

## 2023-10-09 NOTE — Patient Instructions (Signed)
 AROM Exercises   *Complete exercises 10 times each, 2 times per day*  1) Wrist Flexion  Start with wrist at edge of table, palm facing up. With wrist hanging slightly off table, curl wrist upward, and back down.      2) Wrist Extension  Start with wrist at edge of table, palm facing down. With wrist slightly off the edge of the table, curl wrist up and back down.      3) Radial Deviations  Start with forearm flat against a table, wrist hanging slightly off the edge, and palm facing the wall. Bending at the wrist only, and keeping palm facing the wall, bend wrist so fist is pointing towards the floor, back up to start position, and up towards the ceiling. Return to start.

## 2023-10-09 NOTE — Therapy (Addendum)
 OUTPATIENT OCCUPATIONAL THERAPY NEURO TREATMENT  Patient Name: Donald [name] MRN: [id number] DOB:[birth year], 38 y.o., male Today's Date: 10/09/2023    END OF SESSION:  OT End of Session - 10/09/23 1141     Visit Number 3    Number of Visits 12    Date for OT Re-Evaluation 11/21/23    Authorization Type [employer]    Authorization Time Period 4/18-5/17    Authorization - Visit Number 2    Authorization - Number of Visits 4    OT Start Time 1100    OT Stop Time 1139    OT Time Calculation (min) 39 min    Activity Tolerance Patient tolerated treatment well    Behavior During Therapy WFL for tasks assessed/performed               Past Medical History:  Diagnosis Date   Anemia    Autoimmune disorder (HCC)    pyoderma gangrenosum   CHF (congestive heart failure) (HCC)    Chronic systolic heart failure (HCC)    a. EF 35-40% by echo in 07/2018 b. EF at 45% by repeat echo in 04/2020   DVT (deep venous thrombosis) (HCC)    h/o   Dysrhythmia    Hidradenitis suppurativa    Mitral regurgitation    a. 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 b. 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HCC)    arthritis   Shock, septic and cardiogenic 12/28/2021   Tricuspid regurgitation    Past Surgical History:  Procedure Laterality Date   CARDIAC CATHETERIZATION     HERNIA REPAIR Right 2018   MITRAL VALVE REPAIR  06/07/2018   [facility] [place] - Dr Grafton Lawrence   MITRAL VALVE REPLACEMENT N/A 06/23/2021   MULTIPLE EXTRACTIONS WITH ALVEOLOPLASTY N/A 11/08/2020   Procedure: EXTRACTION OF TEETH NUMBER ONE, FIFTHTEEN, SIXTEEN, SEVENTEEN, EIGHTTEEN AND THRTY WITH ALVEOLOPLASTY OF LOWER LEFT QUADRANT.;  Surgeon: Rene Carrier, DMD;  Location:  [facility] OR;  Service: Dentistry;  Laterality: N/A;   RIGHT/LEFT HEART CATH AND CORONARY ANGIOGRAPHY N/A 09/11/2020   Procedure: RIGHT/LEFT HEART CATH AND CORONARY ANGIOGRAPHY;  Surgeon: Mardell Shade, MD;  Location: [facility] INVASIVE CV LAB;  Service: Cardiovascular;  Laterality: N/A;   TEE WITHOUT CARDIOVERSION N/A 05/23/2020   Procedure: TRANSESOPHAGEAL ECHOCARDIOGRAM (TEE) WITH PROPOFOL ;  Surgeon: Laurann Pollock, MD;  Location: [facility] END[facility] SUITE;  Service: Endoscopy;  Laterality: N/A;   Patient Active Problem List   Diagnosis Date Noted   Acute deep vein thrombosis (DVT) of left upper extremity (HCC) 04/02/2023   Anemia 03/12/2022   Nausea 03/12/2022   Hematoma of arm, left, subsequent encounter 03/12/2022   Hypomagnesemia 01/12/2022   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   Left arm pain and swelling 01/10/2022   Cardiomyopathy (HCC)    Paroxysmal atrial fibrillation (HCC) 01/06/2022   Endocarditis of prosthetic mitral valve (HCC)    AKI (acute kidney injury) (HCC)    Acute febrile illness    Anticoagulated on Coumadin     Dehydration    Elevated lactic acid level    Nausea vomiting and diarrhea    Atrial fibrillation with RVR (HCC) 08/03/2021   Sacral wound 08/03/2021   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   Shock liver 08/03/2021   Upper GI bleed  08/03/2021   Iron deficiency anemia 08/03/2021   Hyponatremia 08/03/2021   Cardiogenic shock (HCC)    Acute hypoxemic respiratory failure (HCC) 07/24/2021   Encounter for central line placement    H/O mitral valve replacement 06/20/2021   Encounter for therapeutic drug monitoring 06/20/2021   Chronic heart failure (HCC) 05/30/2021   Acute respiratory failure with hypoxia (HCC) 05/30/2021   Hypokalemia 05/30/2021   Elevated brain natriuretic peptide (BNP) level 05/30/2021   Elevated troponin 05/30/2021   Elevated serum creatinine 05/30/2021   Hypoalbuminemia due to protein-calorie malnutrition (HCC) 05/30/2021   Prolonged QT interval  05/30/2021   Transaminitis 05/30/2021   Paroxysmal atrial flutter (HCC) 05/30/2021   Acute systolic CHF (congestive heart failure) (HCC) 05/30/2021   Acute on chronic systolic and diastolic heart failure, NYHA class 3 (HCC) 05/30/2021   Caries    Chronic periodontitis    Retained dental root    Tricuspid regurgitation    Mitral regurgitation    Acute on chronic HFrEF (heart failure with reduced ejection fraction) (HCC)    Autoimmune disorder (HCC)    Seronegative spondylitis (HCC)    PCP: Deno Flair, FNP  REFERRING PROVIDER: Dr. 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 Weakness began with hematoma on the nerve, since November pt reports improvement in sensation and now has movement in digits 3-5. 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 A brachial plexopathy was felt to be less likely given abnormalities were all distal to the elbow. NMUS showed no definite abnormalities but the left median and ulnar nerves traverse the area of old hematoma in left upper arm and are lost to ultrasound in this area.   PRECAUTIONS: None  WEIGHT BEARING RESTRICTIONS: No  PAIN:  Are you having pain? No  FALLS: Has patient fallen in last 6 months? No  PLOF: Independent  PATIENT GOALS: To have use of the LUE  OBJECTIVE:  Note: Objective measures were completed at Evaluation unless otherwise  noted.  HAND DOMINANCE: Right  ADLs: Overall ADLs: Pt is able to use the left shoulder for reaching, unable to use the LUE functionally for ADLs. Has edema in the LUE. Pt is unable to pronate or supinate the left forearm/wrist/hand, is positioned in neutral. Pt is unable to grasp objects or make a fist, cannot use the left hand functionally. Pt is using RUE for all tasks.    FUNCTIONAL OUTCOME MEASURES: Upper Extremity Functional Scale (UEFS): 2/80  UPPER EXTREMITY ROM:      Assessed in sitting, er/IR adducted  Active ROM Left eval  Shoulder flexion 80  Shoulder abduction 66  Shoulder internal rotation 90  Shoulder external rotation 32  Elbow flexion 115  Elbow extension -34  Wrist flexion 10  Wrist extension 20  Wrist ulnar deviation 0  Wrist radial deviation 0  Wrist pronation 2  Wrist supination 0  (Blank rows = not tested)  UPPER EXTREMITY MMT:  Assessed via observation due to pain and weakness limiting MMT  MMT Left eval  Shoulder flexion 3-/5  Shoulder abduction 3-/5  Shoulder internal rotation 3/5  Shoulder external rotation 3-/5  Elbow flexion 3-/5  Elbow extension 3/5  Wrist flexion 2-/5  Wrist extension 2-/5  Wrist ulnar deviation 1/5  Wrist radial deviation 1/5  Wrist pronation 2-/5  Wrist supination 2-/5  (Blank rows = not tested)  HAND FUNCTION: Unable to test  COORDINATION: Unable to test  SENSATION: Semmes Weinstein monofilament testing: volar and dorsal forearm at 2.83-3.22; wrist and dorsal hand 3.61, palm and digits at 4.56-6.65-loss of protective sensation   EDEMA: generalized edema in hand and digits  OBSERVATIONS: Pt with generalized edema, hand is very cold to the touch. Pt reports this is his baseline. Can wiggle the 3rd-5th digits.                                                                                                                             TREATMENT DATE:   10/09/23 - Edema management: massage distal to  proximal to L hand/wrist -Weightbearing: pt leaning forward onto left elbow and forearm while standing at tabletop, reaching with RUE to the right to get pegs, then across to the left to place into grooved pegboard. Pt with foam pad under left forearm. Holding position for 3'10", OT providing min assist to maintain forearm pronation during task -A/ROM: wrist flexion/extension, ulnar/radial deviation, 10 reps on wedge  -A/ROM: forearm supination/pronation with arm in full extension down by his side, 10 reps -AA/ROM: supine-hand secured to pvc pipe; protraction, flexion, horizontal abduction, er, serratus anterior punches, 10 reps each -A/ROM: seated-elbow flexion, extension, 10 reps -Scapular A/ROM: row, extension, 10 reps  10/02/23 - Edema management: massage distal to proximal to L hand/wrist - P/ROM hand: isolated digit flexion 10x each digit, composite digit flexion 10x, supination/pronation 10x - A/ROM: wrist flexion/extension 10x on wedge  - P/ROM shoulder: supine; flexion, abduction, internal/external rotation 10x  - AA/ROM: supine, hand secured to pvc pipe; flexion, retraction 10x each      PATIENT EDUCATION: Education details: Wrist A/ROM Person educated: Patient Education method: Programmer, multimedia, Demonstration, and Handouts Education comprehension: verbalized understanding and returned demonstration  HOME EXERCISE PROGRAM: 09/22/23: weightbearing on forearm, wrist self-ROM 10/09/23: Wrist A/ROM   GOALS: Goals reviewed with patient? Yes  SHORT TERM GOALS: Target date: 10/22/23  Pt will be provided with and educated on HEP to improve functional use of the LUE during ADLs.   Goal status: IN PROGRESS  2.  Pt will be educated on edema management strategies to decrease edema and improve fluid mobility in the left hand.    Goal status: IN PROGRESS  3.  Pt will be demonstrate independence in weightbearing strategies to improve mobility and progress nerve healing in the LUE.   Goal  status: IN PROGRESS    LONG TERM GOALS: Target date: 11/21/23  Pt will increase LUE A/ROM by at least 20  degrees to improve mobility required for use as assist during dressing tasks.   Goal status: IN PROGRESS  2.  Pt will increase LUE strength to 3/5 or greater to improve ability to perform functional reaching during bathing and grooming tasks.   Goal status: IN PROGRESS  3.  Pt will increase A/ROM of digits to make a 50% fist or greater, to improve ability to use left hand to support lightweight objects when carrying.  Goal status: IN PROGRESS  4.  Pt will decrease pain in LUE to 3/10 or less to improve ability to sleep for 2+ hours without waking due to pain.  Goal status: IN PROGRESS  5.  Pt will be educated on available AE and DME for use to improve independence and success with ADLs.   Goal status: IN PROGRESS    ASSESSMENT:  CLINICAL IMPRESSION: Pt reports he has not worn the glove much since a lot of the swelling went down, discussed continued edema and strategies to manage/further reduce. Pt completing A/ROM for the wrist and elbow, AA/ROM of the shoulder. Pt unable to pronate/supinate his wrist in elbow flexed position, however is able to rotate the arm in full extension by his side. Pt reports no pain during session, did feel fatigue. Verbal cuing for form and technique, rest breaks required as needed.   PERFORMANCE DEFICITS: in functional skills including ADLs, IADLs, coordination, dexterity, proprioception, sensation, edema, ROM, strength, pain, flexibility, Fine motor control, Gross motor control, and UE functional use     PLAN:  OT FREQUENCY:  1x/week for 4 weeks then increase to 2x/week  OT DURATION: 8 weeks  PLANNED INTERVENTIONS: 97168 OT Re-evaluation, 97535 self care/ADL training, 41324 therapeutic exercise, 97530 therapeutic activity, 97112 neuromuscular re-education, 97140 manual therapy, 97035 ultrasound, 97018 paraffin, 40102 moist heat, 97032  electrical stimulation (manual), 97760 Orthotics management and training, 72536 Splinting (initial encounter), [id number] Subsequent splinting/medication, passive range of motion, patient/family education, and DME and/or AE instructions  CONSULTED AND AGREED WITH PLAN OF CARE: Patient  PLAN FOR NEXT SESSION: Follow up on HEP and edema glove wear. Weightbearing, edema management techniques, A/ROM   Lafonda Piety, OTR/L  [phone] 10/09/2023, 11:41 AM

## 2023-10-16 ENCOUNTER — Ambulatory Visit (HOSPITAL_COMMUNITY): Attending: Neurology | Admitting: Occupational Therapy

## 2023-10-16 ENCOUNTER — Encounter (HOSPITAL_COMMUNITY): Payer: Self-pay | Admitting: Occupational Therapy

## 2023-10-16 DIAGNOSIS — R278 Other lack of coordination: Secondary | ICD-10-CM | POA: Insufficient documentation

## 2023-10-16 DIAGNOSIS — M79602 Pain in left arm: Secondary | ICD-10-CM | POA: Diagnosis not present

## 2023-10-16 DIAGNOSIS — R29818 Other symptoms and signs involving the nervous system: Secondary | ICD-10-CM | POA: Insufficient documentation

## 2023-10-16 NOTE — Therapy (Signed)
 OUTPATIENT OCCUPATIONAL THERAPY NEURO TREATMENT  Patient Name: Donald [name] MRN: [id number] DOB:August 29, 1985, 38 y.o., male Today's Date: 10/16/2023    END OF SESSION:  OT End of Session - 10/16/23 1145     Visit Number 4    Number of Visits 12    Date for OT Re-Evaluation 11/21/23    Authorization Type [employer]    Authorization Time Period 4/18-5/17    Authorization - Visit Number 3    Authorization - Number of Visits 4    OT Start Time 1103    OT Stop Time 1143    OT Time Calculation (min) 40 min    Activity Tolerance Patient tolerated treatment well    Behavior During Therapy WFL for tasks assessed/performed                Past Medical History:  Diagnosis Date   Anemia    Autoimmune disorder (HCC)    pyoderma gangrenosum   CHF (congestive heart failure) (HCC)    Chronic systolic heart failure (HCC)    a. EF 35-40% by echo in 07/2018 b. EF at 45% by repeat echo in 04/2020   DVT (deep venous thrombosis) (HCC)    h/o   Dysrhythmia    Hidradenitis suppurativa    Mitral regurgitation    a. 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 b. 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HCC)    arthritis   Shock, septic and cardiogenic 12/28/2021   Tricuspid regurgitation    Past Surgical History:  Procedure Laterality Date   CARDIAC CATHETERIZATION     HERNIA REPAIR Right 2018   MITRAL VALVE REPAIR  06/07/2018   [facility] - Dr Grafton Lawrence   MITRAL VALVE REPLACEMENT N/A 06/23/2021   MULTIPLE EXTRACTIONS WITH ALVEOLOPLASTY N/A 11/08/2020   Procedure: EXTRACTION OF TEETH NUMBER ONE, FIFTHTEEN, SIXTEEN, SEVENTEEN, EIGHTTEEN AND THRTY WITH ALVEOLOPLASTY OF LOWER LEFT QUADRANT.;  Surgeon: Rene Carrier, DMD;   Location: [facility] OR;  Service: Dentistry;  Laterality: N/A;   RIGHT/LEFT HEART CATH AND CORONARY ANGIOGRAPHY N/A 09/11/2020   Procedure: RIGHT/LEFT HEART CATH AND CORONARY ANGIOGRAPHY;  Surgeon: Mardell Shade, MD;  Location: [facility] INVASIVE CV LAB;  Service: Cardiovascular;  Laterality: N/A;   TEE WITHOUT CARDIOVERSION N/A 05/23/2020   Procedure: TRANSESOPHAGEAL ECHOCARDIOGRAM (TEE) WITH PROPOFOL ;  Surgeon: Laurann Pollock, MD;  Location: [facility] END[facility];  Service: Endoscopy;  Laterality: N/A;   Patient Active Problem List   Diagnosis Date Noted   Acute deep vein thrombosis (DVT) of left upper extremity (HCC) 04/02/2023   Anemia 03/12/2022   Nausea 03/12/2022   Hematoma of arm, left, subsequent encounter 03/12/2022   Hypomagnesemia 01/12/2022   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   Left arm pain and swelling 01/10/2022   Cardiomyopathy (HCC)    Paroxysmal atrial fibrillation (HCC) 01/06/2022   Endocarditis of prosthetic mitral valve    AKI (acute kidney injury) (HCC)    Acute febrile illness    Anticoagulated on Coumadin     Dehydration    Elevated lactic acid level    Nausea vomiting and diarrhea    Atrial fibrillation with RVR (HCC) 08/03/2021   Sacral wound 08/03/2021   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   Shock liver 08/03/2021   Upper GI bleed  08/03/2021   Iron deficiency anemia 08/03/2021   Hyponatremia 08/03/2021   Cardiogenic shock (HCC)    Acute hypoxemic respiratory failure (HCC) 07/24/2021   Encounter for central line placement    H/O mitral valve replacement 06/20/2021   Encounter for therapeutic drug monitoring 06/20/2021   Chronic heart failure (HCC) 05/30/2021   Acute respiratory failure with hypoxia (HCC) 05/30/2021   Hypokalemia 05/30/2021   Elevated brain natriuretic peptide (BNP) level 05/30/2021   Elevated troponin 05/30/2021   Elevated serum creatinine 05/30/2021   Hypoalbuminemia due to protein-calorie malnutrition (HCC) 05/30/2021   Prolonged QT interval  05/30/2021   Transaminitis 05/30/2021   Paroxysmal atrial flutter (HCC) 05/30/2021   Acute systolic CHF (congestive heart failure) (HCC) 05/30/2021   Acute on chronic systolic and diastolic heart failure, NYHA class 3 (HCC) 05/30/2021   Caries    Chronic periodontitis    Retained dental root    Tricuspid regurgitation    Mitral regurgitation    Acute on chronic HFrEF (heart failure with reduced ejection fraction) (HCC)    Autoimmune disorder (HCC)    Seronegative spondylitis (HCC)    PCP: Deno Flair, FNP  REFERRING PROVIDER: Dr. 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 Weakness began with hematoma on the nerve, since November pt reports improvement in sensation and now has movement in digits 3-5. 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 A brachial plexopathy was felt to be less likely given abnormalities were all distal to the elbow. NMUS showed no definite abnormalities but the left median and ulnar nerves traverse the area of old hematoma in left upper arm and are lost to ultrasound in this area.   PRECAUTIONS: None  WEIGHT BEARING RESTRICTIONS: No  PAIN:  Are you having pain? No  FALLS: Has patient fallen in last 6 months? No  PLOF: Independent  PATIENT GOALS: To have use of the LUE  OBJECTIVE:  Note: Objective measures were completed  at Evaluation unless otherwise noted.  HAND DOMINANCE: Right  ADLs: Overall ADLs: Pt is able to use the left shoulder for reaching, unable to use the LUE functionally for ADLs. Has edema in the LUE. Pt is unable to pronate or supinate the left forearm/wrist/hand, is positioned in neutral. Pt is unable to grasp objects or make a fist, cannot use the left hand functionally. Pt is using RUE for all tasks.    FUNCTIONAL OUTCOME MEASURES: Upper Extremity Functional Scale (UEFS): 2/80  UPPER EXTREMITY ROM:      Assessed in sitting, er/IR adducted  Active ROM Left eval  Shoulder flexion 80  Shoulder abduction 66  Shoulder internal rotation 90  Shoulder external rotation 32  Elbow flexion 115  Elbow extension -34  Wrist flexion 10  Wrist extension 20  Wrist ulnar deviation 0  Wrist radial deviation 0  Wrist pronation 2  Wrist supination 0  (Blank rows = not tested)  UPPER EXTREMITY MMT:       Assessed via observation due to pain and weakness limiting MMT  MMT Left eval  Shoulder flexion 3-/5  Shoulder abduction 3-/5  Shoulder internal rotation 3/5  Shoulder external rotation 3-/5  Elbow flexion 3-/5  Elbow extension 3/5  Wrist flexion 2-/5  Wrist extension 2-/5  Wrist ulnar deviation 1/5  Wrist radial deviation 1/5  Wrist pronation 2-/5  Wrist supination 2-/5  (Blank rows = not tested)  HAND FUNCTION: Unable to test  COORDINATION: Unable to test  SENSATION: Semmes Weinstein monofilament testing: volar and dorsal forearm at 2.83-3.22; wrist and dorsal hand 3.61, palm and digits at 4.56-6.65-loss of protective sensation   EDEMA: generalized edema in hand and digits  OBSERVATIONS: Pt with generalized edema, hand is very cold to the touch. Pt reports this is his baseline. Can wiggle the 3rd-5th digits.                                                                                                                             TREATMENT DATE:   10/15/23 - Edema  management: massage distal to proximal to L hand/wrist -Weight bearing: forearm push ups on table 2 sets of 1 minute push ups, reaching across body with R arm to L side to pick up coins and slot into container on R side 2 sets 14 coins  -A/ROM: wrist flexion/extension, ulnar/radial deviation, 10 reps on wedge  -A/ROM: forearm supination/pronation with arm in full extension down by his side, 10 reps -AA/ROM: supine-hand secured to pvc pipe; protraction, flexion, horizontal abduction  -A/ROM: seated-elbow flexion, extension, 10 reps -Scapular A/ROM: row, extension, 10 reps -Chest press machine: 2 sets 5 reps set on 20 pounds   10/09/23 - Edema management: massage distal to proximal to L hand/wrist -Weightbearing: pt leaning forward onto left elbow and forearm while standing at tabletop, reaching with RUE to the right to get pegs, then across to the left to place into grooved pegboard. Pt with foam pad under left forearm. Holding position for 3'10", OT providing min assist to maintain forearm pronation during task -A/ROM: wrist flexion/extension, ulnar/radial deviation, 10 reps on wedge  -A/ROM: forearm supination/pronation with arm in full extension down by his side, 10 reps -AA/ROM: supine-hand secured to pvc pipe; protraction, flexion, horizontal abduction, er, serratus anterior punches, 10 reps each -A/ROM: seated-elbow flexion, extension, 10 reps -Scapular A/ROM: row, extension, 10 reps  10/02/23 - Edema management: massage distal to proximal to L hand/wrist - P/ROM hand: isolated digit flexion 10x each digit, composite digit flexion 10x, supination/pronation 10x - A/ROM: wrist flexion/extension 10x on wedge  - P/ROM shoulder: supine; flexion, abduction, internal/external rotation 10x  - AA/ROM: supine, hand secured to pvc pipe; flexion, retraction 10x each      PATIENT EDUCATION: Education details: Wrist A/ROM Person educated: Patient Education method: Programmer, multimedia, Demonstration, and  Handouts Education comprehension: verbalized understanding and returned demonstration  HOME EXERCISE PROGRAM: 09/22/23: weightbearing on forearm, wrist  self-ROM 10/09/23: Wrist A/ROM   GOALS: Goals reviewed with patient? Yes  SHORT TERM GOALS: Target date: 10/22/23  Pt will be provided with and educated on HEP to improve functional use of the LUE during ADLs.   Goal status: IN PROGRESS  2.  Pt will be educated on edema management strategies to decrease edema and improve fluid mobility in the left hand.    Goal status: IN PROGRESS  3.  Pt will be demonstrate independence in weightbearing strategies to improve mobility and progress nerve healing in the LUE.   Goal status: IN PROGRESS    LONG TERM GOALS: Target date: 11/21/23  Pt will increase LUE A/ROM by at least 20 degrees to improve mobility required for use as assist during dressing tasks.   Goal status: IN PROGRESS  2.  Pt will increase LUE strength to 3/5 or greater to improve ability to perform functional reaching during bathing and grooming tasks.   Goal status: IN PROGRESS  3.  Pt will increase A/ROM of digits to make a 50% fist or greater, to improve ability to use left hand to support lightweight objects when carrying.  Goal status: IN PROGRESS  4.  Pt will decrease pain in LUE to 3/10 or less to improve ability to sleep for 2+ hours without waking due to pain.  Goal status: IN PROGRESS  5.  Pt will be educated on available AE and DME for use to improve independence and success with ADLs.   Goal status: IN PROGRESS    ASSESSMENT:  CLINICAL IMPRESSION: Pt stated that he has been wearing edema glove when he has someone to assist him with putting it on, he stated that swelling has continued to be minimal. Continued with weight bearing, added in forearm push ups on table. Completed AA/ROM with L arm secured to PVC pipe in supine to target flexion and retraction. Ended session on chest press machine 2 sets of 5  reps, set at 20 pounds.   PERFORMANCE DEFICITS: in functional skills including ADLs, IADLs, coordination, dexterity, proprioception, sensation, edema, ROM, strength, pain, flexibility, Fine motor control, Gross motor control, and UE functional use     PLAN:  OT FREQUENCY:  1x/week for 4 weeks then increase to 2x/week  OT DURATION: 8 weeks  PLANNED INTERVENTIONS: 97168 OT Re-evaluation, 97535 self care/ADL training, 21308 therapeutic exercise, 97530 therapeutic activity, 97112 neuromuscular re-education, 97140 manual therapy, 97035 ultrasound, 97018 paraffin, 65784 moist heat, 97032 electrical stimulation (manual), 97760 Orthotics management and training, 69629 Splinting (initial encounter), [id number] Subsequent splinting/medication, passive range of motion, patient/family education, and DME and/or AE instructions  CONSULTED AND AGREED WITH PLAN OF CARE: Patient  PLAN FOR NEXT SESSION: Follow up on HEP and edema glove wear. Weightbearing, edema management techniques, A/ROM   Azell Boll, OTR/L  [phone] 10/16/2023, 11:46 AM

## 2023-10-21 ENCOUNTER — Ambulatory Visit: Attending: Cardiology | Admitting: *Deleted

## 2023-10-21 DIAGNOSIS — Z5181 Encounter for therapeutic drug level monitoring: Secondary | ICD-10-CM | POA: Insufficient documentation

## 2023-10-21 DIAGNOSIS — Z952 Presence of prosthetic heart valve: Secondary | ICD-10-CM | POA: Diagnosis not present

## 2023-10-21 DIAGNOSIS — I480 Paroxysmal atrial fibrillation: Secondary | ICD-10-CM | POA: Diagnosis not present

## 2023-10-21 DIAGNOSIS — I6349 Cerebral infarction due to embolism of other cerebral artery: Secondary | ICD-10-CM | POA: Diagnosis not present

## 2023-10-21 LAB — POCT INR: INR: 2 (ref 2.0–3.0)

## 2023-10-21 NOTE — Patient Instructions (Signed)
Take warfarin 1 1/2 tablets tonight and tomorrow night then resume 1 tablet daily   Recheck in 2 wks

## 2023-10-23 ENCOUNTER — Encounter (HOSPITAL_COMMUNITY): Admitting: Occupational Therapy

## 2023-10-26 ENCOUNTER — Telehealth: Payer: Self-pay | Admitting: Cardiology

## 2023-10-26 DIAGNOSIS — L88 Pyoderma gangrenosum: Secondary | ICD-10-CM | POA: Diagnosis not present

## 2023-10-26 DIAGNOSIS — L732 Hidradenitis suppurativa: Secondary | ICD-10-CM | POA: Diagnosis not present

## 2023-10-26 NOTE — Telephone Encounter (Signed)
 Pt c/o medication issue:  1. Name of Medication:   warfarin (COUMADIN ) 5 MG tablet    2. How are you currently taking this medication (dosage and times per day)?  Take warfarin 1 - 1 1/2 tablets daily or as directed by coumadin  clinicPatient taking differently: Take 5-7.5 mg by mouth See admin instructions. Take 1 & 1/2 tablets daily except 1 tablet on Mondays, Wednesdays and Fridays     3. Are you having a reaction (difficulty breathing--STAT)? No  4. What is your medication issue? Office is requesting a callback regarding pt being seen in office today with her today and she'd like to know if it's okay for her to start pt on a antibiotic while he's on this medication. Please advise

## 2023-10-26 NOTE — Telephone Encounter (Signed)
 Spoke with Dr Lydia Sams who called to discuss pt taking abx for his skin issues and its interaction with warfarin.  She will talk to her team and probably start pt on doxycycline  100mg  twice daily.  She will keep me posted.  Doxycycline  will require closer INR checks.

## 2023-10-27 ENCOUNTER — Encounter (HOSPITAL_COMMUNITY): Admitting: Occupational Therapy

## 2023-10-30 ENCOUNTER — Encounter (HOSPITAL_COMMUNITY): Payer: Self-pay | Admitting: Occupational Therapy

## 2023-10-30 ENCOUNTER — Ambulatory Visit (HOSPITAL_COMMUNITY): Admitting: Occupational Therapy

## 2023-10-30 DIAGNOSIS — R29818 Other symptoms and signs involving the nervous system: Secondary | ICD-10-CM | POA: Diagnosis not present

## 2023-10-30 DIAGNOSIS — M79602 Pain in left arm: Secondary | ICD-10-CM

## 2023-10-30 DIAGNOSIS — R278 Other lack of coordination: Secondary | ICD-10-CM

## 2023-10-30 NOTE — Therapy (Signed)
 OUTPATIENT OCCUPATIONAL THERAPY NEURO TREATMENT  Patient Name: Donald [name] MRN: [id number] DOB:11-24-1985, 38 y.o., male Today's Date: 10/30/2023    END OF SESSION:  OT End of Session - 10/30/23 1152     Visit Number 5    Number of Visits 12    Date for OT Re-Evaluation 11/21/23    Authorization Type [employer]    Authorization Time Period 4/18-5/17; requesting additional visits    Authorization - Visit Number 4    Authorization - Number of Visits 4    OT Start Time 1102    OT Stop Time 1145    OT Time Calculation (min) 43 min    Activity Tolerance Patient tolerated treatment well    Behavior During Therapy WFL for tasks assessed/performed                 Past Medical History:  Diagnosis Date   Anemia    Autoimmune disorder (HCC)    pyoderma gangrenosum   CHF (congestive heart failure) (HCC)    Chronic systolic heart failure (HCC)    a. EF 35-40% by echo in 07/2018 b. EF at 45% by repeat echo in 04/2020   DVT (deep venous thrombosis) (HCC)    h/o   Dysrhythmia    Hidradenitis suppurativa    Mitral regurgitation    a. 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 b. 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HCC)    arthritis   Shock, septic and cardiogenic 12/28/2021   Tricuspid regurgitation    Past Surgical History:  Procedure Laterality Date   CARDIAC CATHETERIZATION     HERNIA REPAIR Right 2018   MITRAL VALVE REPAIR  06/07/2018   [facility] - Dr Grafton Lawrence   MITRAL VALVE REPLACEMENT N/A 06/23/2021   MULTIPLE EXTRACTIONS WITH ALVEOLOPLASTY N/A 11/08/2020   Procedure: EXTRACTION OF TEETH NUMBER ONE, FIFTHTEEN, SIXTEEN, SEVENTEEN, EIGHTTEEN AND THRTY WITH ALVEOLOPLASTY OF LOWER LEFT QUADRANT.;  Surgeon:  Rene Carrier, DMD;  Location: [facility] OR;  Service: Dentistry;  Laterality: N/A;   RIGHT/LEFT HEART CATH AND CORONARY ANGIOGRAPHY N/A 09/11/2020   Procedure: RIGHT/LEFT HEART CATH AND CORONARY ANGIOGRAPHY;  Surgeon: Mardell Shade, MD;  Location: [facility] INVASIVE CV LAB;  Service: Cardiovascular;  Laterality: N/A;   TEE WITHOUT CARDIOVERSION N/A 05/23/2020   Procedure: TRANSESOPHAGEAL ECHOCARDIOGRAM (TEE) WITH PROPOFOL ;  Surgeon: Laurann Pollock, MD;  Location: [facility] END[facility]ITE;  Service: Endoscopy;  Laterality: N/A;   Patient Active Problem List   Diagnosis Date Noted   Acute deep vein thrombosis (DVT) of left upper extremity (HCC) 04/02/2023   Anemia 03/12/2022   Nausea 03/12/2022   Hematoma of arm, left, subsequent encounter 03/12/2022   Hypomagnesemia 01/12/2022   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   Left arm pain and swelling 01/10/2022   Cardiomyopathy (HCC)    Paroxysmal atrial fibrillation (HCC) 01/06/2022   Endocarditis of prosthetic mitral valve    AKI (acute kidney injury) (HCC)    Acute febrile illness    Anticoagulated on Coumadin     Dehydration    Elevated lactic acid level    Nausea vomiting and diarrhea    Atrial fibrillation with RVR (HCC) 08/03/2021   Sacral wound 08/03/2021   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   Shock liver 08/03/2021  Upper GI bleed 08/03/2021   Iron deficiency anemia 08/03/2021   Hyponatremia 08/03/2021   Cardiogenic shock (HCC)    Acute hypoxemic respiratory failure (HCC) 07/24/2021   Encounter for central line placement    H/O mitral valve replacement 06/20/2021   Encounter for therapeutic drug monitoring 06/20/2021   Chronic heart failure (HCC) 05/30/2021   Acute respiratory failure with hypoxia (HCC) 05/30/2021   Hypokalemia 05/30/2021   Elevated brain natriuretic peptide (BNP) level 05/30/2021   Elevated troponin 05/30/2021   Elevated serum creatinine 05/30/2021   Hypoalbuminemia due to protein-calorie malnutrition (HCC) 05/30/2021    Prolonged QT interval 05/30/2021   Transaminitis 05/30/2021   Paroxysmal atrial flutter (HCC) 05/30/2021   Acute systolic CHF (congestive heart failure) (HCC) 05/30/2021   Acute on chronic systolic and diastolic heart failure, NYHA class 3 (HCC) 05/30/2021   Caries    Chronic periodontitis    Retained dental root    Tricuspid regurgitation    Mitral regurgitation    Acute on chronic HFrEF (heart failure with reduced ejection fraction) (HCC)    Autoimmune disorder (HCC)    Seronegative spondylitis (HCC)    PCP: Deno Flair, FNP  REFERRING PROVIDER: Dr. 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 Weakness began with hematoma on the nerve, since November pt reports improvement in sensation and now has movement in digits 3-5. 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 A brachial plexopathy was felt to be less likely given abnormalities were all distal to the elbow. NMUS showed no definite abnormalities but the left median and ulnar nerves traverse the area of old hematoma in left upper arm and are lost to ultrasound in this area.   PRECAUTIONS: None  WEIGHT BEARING RESTRICTIONS: No  PAIN:  Are you having pain? No  FALLS: Has patient fallen in last 6 months? No  PLOF: Independent  PATIENT GOALS: To have use of the LUE  OBJECTIVE:  Note:  Objective measures were completed at Evaluation unless otherwise noted.  HAND DOMINANCE: Right  ADLs: Overall ADLs: Pt is able to use the left shoulder for reaching, unable to use the LUE functionally for ADLs. Has edema in the LUE. Pt is unable to pronate or supinate the left forearm/wrist/hand, is positioned in neutral. Pt is unable to grasp objects or make a fist, cannot use the left hand functionally. Pt is using RUE for all tasks.    FUNCTIONAL OUTCOME MEASURES: Upper Extremity Functional Scale (UEFS): 2/80  UPPER EXTREMITY ROM:      Assessed in sitting, er/IR adducted  Active ROM Left eval  Shoulder flexion 80  Shoulder abduction 66  Shoulder internal rotation 90  Shoulder external rotation 32  Elbow flexion 115  Elbow extension -34  Wrist flexion 10  Wrist extension 20  Wrist ulnar deviation 0  Wrist radial deviation 0  Wrist pronation 2  Wrist supination 0  (Blank rows =  not tested)  UPPER EXTREMITY MMT:       Assessed via observation due to pain and weakness limiting MMT  MMT Left eval  Shoulder flexion 3-/5  Shoulder abduction 3-/5  Shoulder internal rotation 3/5  Shoulder external rotation 3-/5  Elbow flexion 3-/5  Elbow extension 3/5  Wrist flexion 2-/5  Wrist extension 2-/5  Wrist ulnar deviation 1/5  Wrist radial deviation 1/5  Wrist pronation 2-/5  Wrist supination 2-/5  (Blank rows = not tested)  HAND FUNCTION: Unable to test  COORDINATION: Unable to test  SENSATION: Semmes Weinstein monofilament testing: volar and dorsal forearm at 2.83-3.22; wrist and dorsal hand 3.61, palm and digits at 4.56-6.65-loss of protective sensation   EDEMA: generalized edema in hand and digits  OBSERVATIONS: Pt with generalized edema, hand is very cold to the touch. Pt reports this is his baseline. Can wiggle the 3rd-5th digits.                                                                                                                              TREATMENT DATE:  10/30/23 - Edema management: massage distal to proximal to L hand/wrist -Weight bearing: forearm push ups on table 2 sets of 1 minute push ups with A/ROM between sets -A/ROM: forearm supination/pronation with arm in full extension down by his side, 10 reps -A/ROM: standing in hammer curl position-elbow flexion, extension, 10 reps -A/ROM: wrist flexion/extension, ulnar/radial deviation, forearm supination/pronation, 10 reps forearm on foam pad and right arm propped under the left -NMES: russian, 36 ma CC; 10' targeting radial nerve activation and thumb/index finger mobility -After NMES, pt able to complete thumb abduction 10 reps  10/15/23 - Edema management: massage distal to proximal to L hand/wrist -Weight bearing: forearm push ups on table 2 sets of 1 minute push ups, reaching across body with R arm to L side to pick up coins and slot into container on R side 2 sets 14 coins  -A/ROM: wrist flexion/extension, ulnar/radial deviation, 10 reps on wedge  -A/ROM: forearm supination/pronation with arm in full extension down by his side, 10 reps -AA/ROM: supine-hand secured to pvc pipe; protraction, flexion, horizontal abduction  -A/ROM: seated-elbow flexion, extension, 10 reps -Scapular A/ROM: row, extension, 10 reps -Chest press machine: 2 sets 5 reps set on 20 pounds   10/09/23 - Edema management: massage distal to proximal to L hand/wrist -Weightbearing: pt leaning forward onto left elbow and forearm while standing at tabletop, reaching with RUE to the right to get pegs, then across to the left to place into grooved pegboard. Pt with foam pad under left forearm. Holding position for 3'10", OT providing min assist to maintain forearm pronation during task -A/ROM: wrist flexion/extension, ulnar/radial deviation, 10 reps on wedge  -A/ROM: forearm supination/pronation with arm in full extension down by his side, 10 reps -AA/ROM: supine-hand secured to pvc pipe; protraction,  flexion, horizontal abduction, er, serratus anterior punches, 10 reps each -A/ROM: seated-elbow flexion, extension, 10  reps -Scapular A/ROM: row, extension, 10 reps    PATIENT EDUCATION: Education details: reviewed HEP Person educated: Patient Education method: Explanation, Demonstration, and Handouts Education comprehension: verbalized understanding and returned demonstration  HOME EXERCISE PROGRAM: 09/22/23: weightbearing on forearm, wrist self-ROM 10/09/23: Wrist A/ROM   GOALS: Goals reviewed with patient? Yes  SHORT TERM GOALS: Target date: 10/22/23  Pt will be provided with and educated on HEP to improve functional use of the LUE during ADLs.   Goal status: IN PROGRESS  2.  Pt will be educated on edema management strategies to decrease edema and improve fluid mobility in the left hand.    Goal status: IN PROGRESS  3.  Pt will be demonstrate independence in weightbearing strategies to improve mobility and progress nerve healing in the LUE.   Goal status: IN PROGRESS    LONG TERM GOALS: Target date: 11/21/23  Pt will increase LUE A/ROM by at least 20 degrees to improve mobility required for use as assist during dressing tasks.   Goal status: IN PROGRESS  2.  Pt will increase LUE strength to 3/5 or greater to improve ability to perform functional reaching during bathing and grooming tasks.   Goal status: IN PROGRESS  3.  Pt will increase A/ROM of digits to make a 50% fist or greater, to improve ability to use left hand to support lightweight objects when carrying.  Goal status: IN PROGRESS  4.  Pt will decrease pain in LUE to 3/10 or less to improve ability to sleep for 2+ hours without waking due to pain.  Goal status: IN PROGRESS  5.  Pt will be educated on available AE and DME for use to improve independence and success with ADLs.   Goal status: IN PROGRESS    ASSESSMENT:  CLINICAL IMPRESSION: Pt reports he wears the edema glove some, more swelling today  as he has not worn it today. Continued with weightbearing and A/ROM today. Pt does have some improved strength noted during A/ROM, also noting improved mobility with pronation and supination. Trialed NMES today for radial nerve activation. After NMES pt able to activate thumb whereas unable to perform prior to NMES. Left hand continues to be cold to the touch with flexion contractures at PIP joints of 4th and 5th digits. Pt continues to have severe functional deficits due to weakness, stiffness, and mobility limitations in the LUE.   PERFORMANCE DEFICITS: in functional skills including ADLs, IADLs, coordination, dexterity, proprioception, sensation, edema, ROM, strength, pain, flexibility, Fine motor control, Gross motor control, and UE functional use     PLAN:  OT FREQUENCY: 1x/week for 4 weeks then increase to 2x/week  OT DURATION: 8 weeks  PLANNED INTERVENTIONS: 97168 OT Re-evaluation, 97535 self care/ADL training, 27253 therapeutic exercise, 97530 therapeutic activity, 97112 neuromuscular re-education, 97140 manual therapy, 97035 ultrasound, 97018 paraffin, 66440 moist heat, 97032 electrical stimulation (manual), 97760 Orthotics management and training, 34742 Splinting (initial encounter), [id number] Subsequent splinting/medication, passive range of motion, patient/family education, and DME and/or AE instructions  CONSULTED AND AGREED WITH PLAN OF CARE: Patient  PLAN FOR NEXT SESSION: Follow up on HEP and edema glove wear. Weightbearing, edema management techniques, A/ROM   Lafonda Piety, OTR/L  [phone] 10/30/2023, 11:53 AM       Managed Medicaid Authorization Request  Visit Dx Codes: P32.951, M79.602, R27.8  Functional Tool Score: UEFS: 26-Aug-1976  For all possible CPT codes, reference the Planned Interventions line above.     Check all conditions that are expected to impact treatment: {Conditions expected  to impact treatment:None of these apply   If treatment provided at initial  evaluation, no treatment charged due to lack of authorization.

## 2023-11-03 ENCOUNTER — Ambulatory Visit (HOSPITAL_COMMUNITY): Admitting: Occupational Therapy

## 2023-11-03 ENCOUNTER — Encounter (HOSPITAL_COMMUNITY): Payer: Self-pay | Admitting: Occupational Therapy

## 2023-11-03 DIAGNOSIS — R278 Other lack of coordination: Secondary | ICD-10-CM

## 2023-11-03 DIAGNOSIS — R29818 Other symptoms and signs involving the nervous system: Secondary | ICD-10-CM

## 2023-11-03 DIAGNOSIS — M79602 Pain in left arm: Secondary | ICD-10-CM | POA: Diagnosis not present

## 2023-11-03 NOTE — Therapy (Signed)
 OUTPATIENT OCCUPATIONAL THERAPY NEURO TREATMENT  Patient Name: Donald [name] MRN: [id number] DOB:May 10, 1986, 38 y.o., male Today's Date: 11/03/2023    END OF SESSION:  OT End of Session - 11/03/23 1150     Visit Number 6    Number of Visits 12    Date for OT Re-Evaluation 11/21/23    Authorization Type [employer]    Authorization Time Period 2 visits 5/20-6/18/25    Authorization - Visit Number 1    Authorization - Number of Visits 2    OT Start Time 1104    OT Stop Time 1145    OT Time Calculation (min) 41 min    Activity Tolerance Patient tolerated treatment well    Behavior During Therapy WFL for tasks assessed/performed                  Past Medical History:  Diagnosis Date   Anemia    Autoimmune disorder (HCC)    pyoderma gangrenosum   CHF (congestive heart failure) (HCC)    Chronic systolic heart failure (HCC)    a. EF 35-40% by echo in 07/2018 b. EF at 45% by repeat echo in 04/2020   DVT (deep venous thrombosis) (HCC)    h/o   Dysrhythmia    Hidradenitis suppurativa    Mitral regurgitation    a. 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 b. 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HCC)    arthritis   Shock, septic and cardiogenic 12/28/2021   Tricuspid regurgitation    Past Surgical History:  Procedure Laterality Date   CARDIAC CATHETERIZATION     HERNIA REPAIR Right 2018   MITRAL VALVE REPAIR  06/07/2018   [facility] - Dr Grafton Lawrence   MITRAL VALVE REPLACEMENT N/A 06/23/2021   MULTIPLE EXTRACTIONS WITH ALVEOLOPLASTY N/A 11/08/2020   Procedure: EXTRACTION OF TEETH NUMBER ONE, FIFTHTEEN, SIXTEEN, SEVENTEEN, EIGHTTEEN AND THRTY WITH ALVEOLOPLASTY OF LOWER LEFT QUADRANT.;  Surgeon: Rene Carrier, DMD;  Location: [facility] OR;  Service: Dentistry;  Laterality: N/A;   RIGHT/LEFT HEART CATH AND CORONARY ANGIOGRAPHY N/A 09/11/2020   Procedure: RIGHT/LEFT HEART CATH AND CORONARY ANGIOGRAPHY;  Surgeon: Mardell Shade, MD;  Location: [facility] INVASIVE CV LAB;  Service: Cardiovascular;  Laterality: N/A;   TEE WITHOUT CARDIOVERSION N/A 05/23/2020   Procedure: TRANSESOPHAGEAL ECHOCARDIOGRAM (TEE) WITH PROPOFOL ;  Surgeon: Laurann Pollock, MD;  Location: [facility];  Service: Endoscopy;  Laterality: N/A;   Patient Active Problem List   Diagnosis Date Noted   Acute deep vein thrombosis (DVT) of left upper extremity (HCC) 04/02/2023   Anemia 03/12/2022   Nausea 03/12/2022   Hematoma of arm, left, subsequent encounter 03/12/2022   Hypomagnesemia 01/12/2022   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   Left arm pain and swelling 01/10/2022   Cardiomyopathy (HCC)    Paroxysmal atrial fibrillation (HCC) 01/06/2022   Endocarditis of prosthetic mitral valve    AKI (acute kidney injury) (HCC)    Acute febrile illness    Anticoagulated on Coumadin     Dehydration    Elevated lactic acid level    Nausea vomiting and diarrhea    Atrial fibrillation with RVR (HCC) 08/03/2021   Sacral wound 08/03/2021   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   Shock liver 08/03/2021  Upper GI bleed 08/03/2021   Iron deficiency anemia 08/03/2021   Hyponatremia 08/03/2021   Cardiogenic shock (HCC)    Acute hypoxemic respiratory failure (HCC) 07/24/2021   Encounter for central line placement    H/O mitral valve replacement 06/20/2021   Encounter for therapeutic drug monitoring 06/20/2021   Chronic heart failure (HCC) 05/30/2021   Acute respiratory failure with hypoxia (HCC) 05/30/2021   Hypokalemia 05/30/2021   Elevated brain natriuretic peptide (BNP) level 05/30/2021   Elevated troponin 05/30/2021   Elevated serum creatinine 05/30/2021   Hypoalbuminemia due to protein-calorie malnutrition (HCC) 05/30/2021   Prolonged QT  interval 05/30/2021   Transaminitis 05/30/2021   Paroxysmal atrial flutter (HCC) 05/30/2021   Acute systolic CHF (congestive heart failure) (HCC) 05/30/2021   Acute on chronic systolic and diastolic heart failure, NYHA class 3 (HCC) 05/30/2021   Caries    Chronic periodontitis    Retained dental root    Tricuspid regurgitation    Mitral regurgitation    Acute on chronic HFrEF (heart failure with reduced ejection fraction) (HCC)    Autoimmune disorder (HCC)    Seronegative spondylitis (HCC)    PCP: Deno Flair, FNP  REFERRING PROVIDER: Dr. 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 Weakness began with hematoma on the nerve, since November pt reports improvement in sensation and now has movement in digits 3-5. 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 A brachial plexopathy was felt to be less likely given abnormalities were all distal to the elbow. NMUS showed no definite abnormalities but the left median and ulnar nerves traverse the area of old hematoma in left upper arm and are lost to ultrasound in this area.   PRECAUTIONS: None  WEIGHT BEARING RESTRICTIONS: No  PAIN:  Are you having pain? No  FALLS: Has patient fallen in last 6 months? No  PLOF: Independent  PATIENT GOALS: To have use of the LUE  OBJECTIVE:  Note: Objective measures were completed at Evaluation  unless otherwise noted.  HAND DOMINANCE: Right  ADLs: Overall ADLs: Pt is able to use the left shoulder for reaching, unable to use the LUE functionally for ADLs. Has edema in the LUE. Pt is unable to pronate or supinate the left forearm/wrist/hand, is positioned in neutral. Pt is unable to grasp objects or make a fist, cannot use the left hand functionally. Pt is using RUE for all tasks.    FUNCTIONAL OUTCOME MEASURES: Upper Extremity Functional Scale (UEFS): 2/80  UPPER EXTREMITY ROM:      Assessed in sitting, er/IR adducted  Active ROM Left eval  Shoulder flexion 80  Shoulder abduction 66  Shoulder internal rotation 90  Shoulder external rotation 32  Elbow flexion 115  Elbow extension -34  Wrist flexion 10  Wrist extension 20  Wrist ulnar deviation 0  Wrist radial deviation 0  Wrist pronation 2  Wrist supination 0  (Blank rows = not tested)  UPPER EXTREMITY MMT:  Assessed via observation due to pain and weakness limiting MMT  MMT Left eval  Shoulder flexion 3-/5  Shoulder abduction 3-/5  Shoulder internal rotation 3/5  Shoulder external rotation 3-/5  Elbow flexion 3-/5  Elbow extension 3/5  Wrist flexion 2-/5  Wrist extension 2-/5  Wrist ulnar deviation 1/5  Wrist radial deviation 1/5  Wrist pronation 2-/5  Wrist supination 2-/5  (Blank rows = not tested)  HAND FUNCTION: Unable to test  COORDINATION: Unable to test  SENSATION: Semmes Weinstein monofilament testing: volar and dorsal forearm at 2.83-3.22; wrist and dorsal hand 3.61, palm and digits at 4.56-6.65-loss of protective sensation   EDEMA: generalized edema in hand and digits  OBSERVATIONS: Pt with generalized edema, hand is very cold to the touch. Pt reports this is his baseline. Can wiggle the 3rd-5th digits.                                                                                                                             TREATMENT DATE:  11/03/23 -Weightbearing: pt  scrubbing tabletop with a towel, working to put pressure through the LUE throughout task -A/ROM: forearm supination/pronation with arm in full extension down by his side, 10 reps -A/ROM: standing in hammer curl position-elbow flexion, extension, 10 reps -A/ROM: wrist flexion/extension, ulnar/radial deviation, forearm supination/pronation, 10 reps forearm on foam pad and right arm propped under the left -Sponges: pt working to grasp one sponge at a time and transfer to bucket, increased time, grasping in loose lateral pinch style. Increased time required for task, multiple attempts due to dropping sponges. Completed 20 sponges -NMES: russian, 34 ma CC; 10' targeting radial nerve activation and thumb/index finger mobility -After NMES, pt able to complete thumb abduction 10 reps  10/30/23 - Edema management: massage distal to proximal to L hand/wrist -Weight bearing: forearm push ups on table 2 sets of 1 minute push ups with A/ROM between sets -A/ROM: forearm supination/pronation with arm in full extension down by his side, 10 reps -A/ROM: standing in hammer curl position-elbow flexion, extension, 10 reps -A/ROM: wrist flexion/extension, ulnar/radial deviation, forearm supination/pronation, 10 reps forearm on foam pad and right arm propped under the left -NMES: russian, 36 ma CC; 10' targeting radial nerve activation and thumb/index finger mobility -After NMES, pt able to complete thumb abduction 10 reps  10/15/23 - Edema management: massage distal to proximal to L hand/wrist -Weight bearing: forearm push ups on table 2 sets of 1 minute push ups, reaching across body with R arm to L side to pick up coins and slot into container on R side 2 sets 14 coins  -A/ROM: wrist flexion/extension, ulnar/radial deviation, 10 reps on wedge  -A/ROM: forearm supination/pronation with arm in full extension down by his side, 10 reps -AA/ROM: supine-hand secured to pvc pipe; protraction, flexion, horizontal abduction   -A/ROM: seated-elbow flexion, extension, 10 reps -Scapular A/ROM: row, extension, 10 reps -Chest press machine: 2 sets 5 reps set on 20 pounds  PATIENT EDUCATION: Education details: reviewed HEP Person educated: Patient Education method: Explanation, Demonstration, and Handouts Education comprehension: verbalized understanding and returned demonstration  HOME EXERCISE PROGRAM: 09/22/23: weightbearing on forearm, wrist self-ROM 10/09/23: Wrist A/ROM   GOALS: Goals reviewed with patient? Yes  SHORT TERM GOALS: Target date: 10/22/23  Pt will be provided with and educated on HEP to improve functional use of the LUE during ADLs.   Goal status: IN PROGRESS  2.  Pt will be educated on edema management strategies to decrease edema and improve fluid mobility in the left hand.    Goal status: IN PROGRESS  3.  Pt will be demonstrate independence in weightbearing strategies to improve mobility and progress nerve healing in the LUE.   Goal status: IN PROGRESS    LONG TERM GOALS: Target date: 11/21/23  Pt will increase LUE A/ROM by at least 20 degrees to improve mobility required for use as assist during dressing tasks.   Goal status: IN PROGRESS  2.  Pt will increase LUE strength to 3/5 or greater to improve ability to perform functional reaching during bathing and grooming tasks.   Goal status: IN PROGRESS  3.  Pt will increase A/ROM of digits to make a 50% fist or greater, to improve ability to use left hand to support lightweight objects when carrying.  Goal status: IN PROGRESS  4.  Pt will decrease pain in LUE to 3/10 or less to improve ability to sleep for 2+ hours without waking due to pain.  Goal status: IN PROGRESS  5.  Pt will be educated on available AE and DME for use to improve independence and success with ADLs.   Goal status: IN PROGRESS    ASSESSMENT:  CLINICAL IMPRESSION: Pt reports not much change today, has maintained thumb abduction since prior  visit, however not able to isolate without extending all digits. Continued with A/ROM, added scrubbing activity for weightbearing at beginning of session. Added sponge task working to facilitate lateral pinch max difficulty however was able to transfer all sponges. Continued with NMES and reduced to 34 mA CC. Verbal cuing for form and technique.   PERFORMANCE DEFICITS: in functional skills including ADLs, IADLs, coordination, dexterity, proprioception, sensation, edema, ROM, strength, pain, flexibility, Fine motor control, Gross motor control, and UE functional use     PLAN:  OT FREQUENCY: 1x/week for 4 weeks then increase to 2x/week  OT DURATION: 8 weeks  PLANNED INTERVENTIONS: 97168 OT Re-evaluation, 97535 self care/ADL training, 14782 therapeutic exercise, 97530 therapeutic activity, 97112 neuromuscular re-education, 97140 manual therapy, 97035 ultrasound, 97018 paraffin, 95621 moist heat, 97032 electrical stimulation (manual), 97760 Orthotics management and training, 30865 Splinting (initial encounter), [id number] Subsequent splinting/medication, passive range of motion, patient/family education, and DME and/or AE instructions  CONSULTED AND AGREED WITH PLAN OF CARE: Patient  PLAN FOR NEXT SESSION: REASSESSMENT/PROGRESS NOTE   Lafonda Piety, OTR/L  [phone] 11/03/2023, 12:06 PM

## 2023-11-04 ENCOUNTER — Ambulatory Visit: Attending: Cardiology | Admitting: *Deleted

## 2023-11-04 DIAGNOSIS — Z952 Presence of prosthetic heart valve: Secondary | ICD-10-CM | POA: Diagnosis not present

## 2023-11-04 DIAGNOSIS — I480 Paroxysmal atrial fibrillation: Secondary | ICD-10-CM | POA: Diagnosis not present

## 2023-11-04 DIAGNOSIS — Z5181 Encounter for therapeutic drug level monitoring: Secondary | ICD-10-CM | POA: Insufficient documentation

## 2023-11-04 DIAGNOSIS — I6349 Cerebral infarction due to embolism of other cerebral artery: Secondary | ICD-10-CM | POA: Insufficient documentation

## 2023-11-04 LAB — POCT INR: INR: 2.4 (ref 2.0–3.0)

## 2023-11-04 NOTE — Patient Instructions (Signed)
Take warfarin 1 1/2 tablets tonight then resume 1 tablet daily Recheck in 2 wks

## 2023-11-06 ENCOUNTER — Encounter (HOSPITAL_COMMUNITY): Payer: Self-pay | Admitting: Occupational Therapy

## 2023-11-06 ENCOUNTER — Ambulatory Visit (HOSPITAL_COMMUNITY): Admitting: Occupational Therapy

## 2023-11-06 DIAGNOSIS — R278 Other lack of coordination: Secondary | ICD-10-CM | POA: Diagnosis not present

## 2023-11-06 DIAGNOSIS — M79602 Pain in left arm: Secondary | ICD-10-CM

## 2023-11-06 DIAGNOSIS — R29818 Other symptoms and signs involving the nervous system: Secondary | ICD-10-CM

## 2023-11-06 NOTE — Therapy (Signed)
 OUTPATIENT OCCUPATIONAL THERAPY NEURO TREATMENT  Patient Name: Donald [name] MRN: [id number] DOB:[birth year], 38 y.o., male Today's Date: 11/06/2023    END OF SESSION:  OT End of Session - 11/06/23 1145     Visit Number 7    Number of Visits 12    Date for OT Re-Evaluation 11/21/23    Authorization Type [employer]    Authorization Time Period 2 visits 5/20-6/18/25    Authorization - Visit Number 2    Authorization - Number of Visits 2    OT Start Time 1104    OT Stop Time 1143    OT Time Calculation (min) 39 min    Activity Tolerance Patient tolerated treatment well    Behavior During Therapy WFL for tasks assessed/performed                   Past Medical History:  Diagnosis Date   Anemia    Autoimmune disorder (HCC)    pyoderma gangrenosum   CHF (congestive heart failure) (HCC)    Chronic systolic heart failure (HCC)    a. EF 35-40% by echo in 07/2018 b. EF at 45% by repeat echo in 04/2020   DVT (deep venous thrombosis) (HCC)    h/o   Dysrhythmia    Hidradenitis suppurativa    Mitral regurgitation    a. 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 b. 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HCC)    arthritis   Shock, septic and cardiogenic 12/28/2021   Tricuspid regurgitation    Past Surgical History:  Procedure Laterality Date   CARDIAC CATHETERIZATION     HERNIA REPAIR Right 2018   MITRAL VALVE REPAIR  06/07/2018   [facility] Grafton [facility]   MITRAL VALVE REPLACEMENT N/A 06/23/2021   MULTIPLE EXTRACTIONS WITH ALVEOLOPLASTY N/A 11/08/2020   Procedure: EXTRACTION OF TEETH NUMBER ONE, FIFTHTEEN, SIXTEEN, SEVENTEEN, EIGHTTEEN AND THRTY WITH ALVEOLOPLASTY OF LOWER LEFT QUADRANT.;  Surgeon: Rene Carrier, DMD;  Location: [facility] OR;  Service: Dentistry;  Laterality: N/A;   RIGHT/LEFT HEART CATH AND CORONARY ANGIOGRAPHY N/A 09/11/2020   Procedure: RIGHT/LEFT HEART CATH AND CORONARY ANGIOGRAPHY;  Surgeon: Mardell Shade, MD;  Location: [facility] INVASIVE CV LAB;  Service: Cardiovascular;  Laterality: N/A;   TEE WITHOUT CARDIOVERSION N/A 05/23/2020   Procedure: TRANSESOPHAGEAL ECHOCARDIOGRAM (TEE) WITH PROPOFOL ;  Surgeon: Laurann Pollock, MD;  Location: [facility] END[facility];  Service: Endoscopy;  Laterality: N/A;   Patient Active Problem List   Diagnosis Date Noted   Acute deep vein thrombosis (DVT) of left upper extremity (HCC) 04/02/2023   Anemia 03/12/2022   Nausea 03/12/2022   Hematoma of arm, left, subsequent encounter 03/12/2022   Hypomagnesemia 01/12/2022   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   Left arm pain and swelling 01/10/2022   Cardiomyopathy (HCC)    Paroxysmal atrial fibrillation (HCC) 01/06/2022   Endocarditis of prosthetic mitral valve    AKI (acute kidney injury) (HCC)    Acute febrile illness    Anticoagulated on Coumadin     Dehydration    Elevated lactic acid level    Nausea vomiting and diarrhea    Atrial fibrillation with RVR (HCC) 08/03/2021   Sacral wound 08/03/2021   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   Shock liver 08/03/2021  Upper GI bleed 08/03/2021   Iron deficiency anemia 08/03/2021   Hyponatremia 08/03/2021   Cardiogenic shock (HCC)    Acute hypoxemic respiratory failure (HCC) 07/24/2021   Encounter for central line placement    H/O mitral valve replacement 06/20/2021   Encounter for therapeutic drug monitoring 06/20/2021   Chronic heart failure (HCC) 05/30/2021   Acute respiratory failure with hypoxia (HCC) 05/30/2021   Hypokalemia 05/30/2021   Elevated brain natriuretic peptide (BNP) level 05/30/2021   Elevated troponin 05/30/2021   Elevated serum creatinine 05/30/2021   Hypoalbuminemia due to protein-calorie malnutrition (HCC) 05/30/2021   Prolonged  QT interval 05/30/2021   Transaminitis 05/30/2021   Paroxysmal atrial flutter (HCC) 05/30/2021   Acute systolic CHF (congestive heart failure) (HCC) 05/30/2021   Acute on chronic systolic and diastolic heart failure, NYHA class 3 (HCC) 05/30/2021   Caries    Chronic periodontitis    Retained dental root    Tricuspid regurgitation    Mitral regurgitation    Acute on chronic HFrEF (heart failure with reduced ejection fraction) (HCC)    Autoimmune disorder (HCC)    Seronegative spondylitis (HCC)    PCP: Deno Flair, FNP  REFERRING PROVIDER: Dr. 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 Weakness began with hematoma on the nerve, since November pt reports improvement in sensation and now has movement in digits 3-5. 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 A brachial plexopathy was felt to be less likely given abnormalities were all distal to the elbow. NMUS showed no definite abnormalities but the left median and ulnar nerves traverse the area of old hematoma in left upper arm and are lost to ultrasound in this area.   PRECAUTIONS: None  WEIGHT BEARING RESTRICTIONS: No  PAIN:  Are you having pain? No  FALLS: Has patient fallen in last 6 months? No  PLOF: Independent  PATIENT GOALS: To have use of the LUE  OBJECTIVE:  Note: Objective measures were completed at  Evaluation unless otherwise noted.  HAND DOMINANCE: Right  ADLs: Overall ADLs: Pt is able to use the left shoulder for reaching, unable to use the LUE functionally for ADLs. Has edema in the LUE. Pt is unable to pronate or supinate the left forearm/wrist/hand, is positioned in neutral. Pt is unable to grasp objects or make a fist, cannot use the left hand functionally. Pt is using RUE for all tasks.    FUNCTIONAL OUTCOME MEASURES: Upper Extremity Functional Scale (UEFS): 2/80  UPPER EXTREMITY ROM:      Assessed in sitting, er/IR adducted  Active ROM Left eval Left 5/23 Re assessment   Shoulder flexion 80 110  Shoulder abduction 66 98  Shoulder internal rotation 90 90  Shoulder external rotation 32 45  Elbow flexion 115 130  Elbow extension -34 -30  Wrist flexion 10 12  Wrist extension 20 30  Wrist ulnar deviation 0 0  Wrist radial deviation 0 0  Wrist pronation 2  3  Wrist supination 0 3  (Blank rows = not tested)  UPPER EXTREMITY MMT:       Assessed via observation due to pain and weakness limiting MMT  MMT Left eval Left 5/23 Re assessment   Shoulder flexion 3-/5 3-/5  Shoulder abduction 3-/5 3-/5  Shoulder internal rotation 3/5 4-/5  Shoulder external rotation 3-/5 4-/5  Elbow flexion 3-/5 4-/5  Elbow extension 3/5 3/5  Wrist flexion 2-/5 2-/5  Wrist extension 2-/5 3-/5  Wrist ulnar deviation 1/5 1/5  Wrist radial deviation 1/5 1/5  Wrist pronation 2-/5 2-5  Wrist supination 2-/5 2-5  (Blank rows = not tested)  HAND FUNCTION: Unable to test  COORDINATION: Unable to test  SENSATION: Semmes Weinstein monofilament testing: volar and dorsal forearm at 2.83-3.22; wrist and dorsal hand 3.61, palm and digits at 4.56-6.65-loss of protective sensation   EDEMA: generalized edema in hand and digits  OBSERVATIONS: Pt with generalized edema, hand is very cold to the touch. Pt reports this is his baseline. Can wiggle the 3rd-5th digits.                                                                                                                              TREATMENT DATE:   11/06/23 -Weightbearing: WB through forearms doing scapular push ups 1 min 2 sets, WB through forearms scrubbing table with towel going into shoulder flexion and pulling towel back towards   -A/ROM: standing in hammer curl position-elbow flexion, extension, 10 reps -A/ROM: forearm supination/pronation with arm in full extension down by his side, 10 reps   11/03/23 -Weightbearing: pt scrubbing tabletop with a towel, working to put pressure through the LUE throughout task -A/ROM: forearm supination/pronation with arm in full extension down by his side, 10 reps -A/ROM: standing in hammer curl position-elbow flexion, extension, 10 reps -A/ROM: wrist flexion/extension, ulnar/radial deviation, forearm supination/pronation, 10 reps forearm on foam pad and right arm propped under the left -Sponges: pt working to grasp one sponge at a time and transfer to bucket, increased time, grasping in loose lateral pinch style. Increased time required for task, multiple attempts due to dropping sponges. Completed 20 sponges -NMES: russian, 34 ma CC; 10' targeting radial nerve activation and thumb/index finger mobility -After NMES, pt able to complete thumb abduction 10 reps  10/30/23 - Edema management: massage distal to proximal to L hand/wrist -Weight bearing: forearm push ups on table 2 sets of 1 minute push ups with A/ROM between sets -A/ROM: forearm supination/pronation with arm in full extension down by his side, 10 reps -A/ROM: standing in hammer curl position-elbow flexion, extension, 10 reps -A/ROM: wrist flexion/extension, ulnar/radial deviation, forearm supination/pronation, 10 reps forearm on foam pad and right arm propped under the left -NMES: russian, 36 ma CC; 10' targeting radial nerve activation and thumb/index finger mobility -After NMES, pt able to complete thumb abduction 10 reps      PATIENT EDUCATION: Education details: reviewed HEP Person educated: Patient Education  method: Explanation, Demonstration, and Handouts Education comprehension: verbalized understanding and returned demonstration  HOME EXERCISE PROGRAM: 09/22/23: weightbearing on forearm, wrist self-ROM 10/09/23: Wrist A/ROM   GOALS: Goals reviewed with patient? Yes  SHORT TERM GOALS: Target date: 10/22/23  Pt will be provided with and educated on HEP to improve functional use of the LUE during ADLs.   Goal status: IN PROGRESS  2.  Pt will be educated on edema management strategies to decrease edema and improve fluid mobility in the left hand.    Goal status: IN PROGRESS  3.  Pt will be demonstrate independence in weightbearing strategies to improve mobility and progress nerve healing in the LUE.   Goal status: IN PROGRESS    LONG TERM GOALS: Target date: 11/21/23  Pt will increase LUE A/ROM by at least 20 degrees to improve mobility required for use as assist during dressing tasks.   Goal status: IN PROGRESS  2.  Pt will increase LUE strength to 3/5 or greater to improve ability to perform functional reaching during bathing and grooming tasks.   Goal status: IN PROGRESS  3.  Pt will increase A/ROM of digits to make a 50% fist or greater, to improve ability to use left hand to support lightweight objects when carrying.  Goal status: IN PROGRESS  4.  Pt will decrease pain in LUE to 3/10 or less to improve ability to sleep for 2+ hours without waking due to pain.  Goal status: IN PROGRESS  5.  Pt will be educated on available AE and DME for use to improve independence and success with ADLs.   Goal status: IN PROGRESS    ASSESSMENT:  CLINICAL IMPRESSION: Re- assessment completed today. Improvements noted in ROM and strength in left upper extremity. Continued with weight bearing tasks. Pt reports that weight bearing through forearms pushing into flexion and pulling back towards  body felt nice to stretch. Added this movement to HEP. Ended session with chest press to improve endurance and strength.    PERFORMANCE DEFICITS: in functional skills including ADLs, IADLs, coordination, dexterity, proprioception, sensation, edema, ROM, strength, pain, flexibility, Fine motor control, Gross motor control, and UE functional use     PLAN:  OT FREQUENCY: 1x/week for 4 weeks then increase to 2x/week  OT DURATION: 8 weeks  PLANNED INTERVENTIONS: 97168 OT Re-evaluation, 97535 self care/ADL training, 56213 therapeutic exercise, 97530 therapeutic activity, 97112 neuromuscular re-education, 97140 manual therapy, 97035 ultrasound, 97018 paraffin, 08657 moist heat, 97032 electrical stimulation (manual), 97760 Orthotics management and training, 84696 Splinting (initial encounter), [id number] Subsequent splinting/medication, passive range of motion, patient/family education, and DME and/or AE instructions  CONSULTED AND AGREED WITH PLAN OF CARE: Patient  PLAN FOR NEXT SESSION: REASSESSMENT/PROGRESS NOTE   Azell Boll, OTR/L  [id number] 11/06/2023, 11:46 AM       Managed Medicaid Authorization Request  Visit Dx Codes: M01.027, M79.602, R27.8  Functional Tool Score: Aug 03, 1976  For all possible CPT codes, reference the Planned Interventions line above.     Check all conditions that are expected to impact treatment: {Conditions expected to impact treatment:None of these apply   If treatment provided at initial evaluation, no treatment charged due to lack of authorization.

## 2023-11-10 ENCOUNTER — Encounter (HOSPITAL_COMMUNITY): Admitting: Occupational Therapy

## 2023-11-13 ENCOUNTER — Encounter (HOSPITAL_COMMUNITY): Admitting: Occupational Therapy

## 2023-11-17 ENCOUNTER — Encounter (HOSPITAL_COMMUNITY): Admitting: Occupational Therapy

## 2023-11-18 ENCOUNTER — Ambulatory Visit: Attending: Cardiology | Admitting: *Deleted

## 2023-11-18 DIAGNOSIS — Z5181 Encounter for therapeutic drug level monitoring: Secondary | ICD-10-CM | POA: Insufficient documentation

## 2023-11-18 DIAGNOSIS — I480 Paroxysmal atrial fibrillation: Secondary | ICD-10-CM | POA: Diagnosis not present

## 2023-11-18 DIAGNOSIS — Z952 Presence of prosthetic heart valve: Secondary | ICD-10-CM | POA: Insufficient documentation

## 2023-11-18 DIAGNOSIS — I6349 Cerebral infarction due to embolism of other cerebral artery: Secondary | ICD-10-CM | POA: Insufficient documentation

## 2023-11-18 LAB — POCT INR: INR: 1.4 — AB (ref 2.0–3.0)

## 2023-11-18 NOTE — Patient Instructions (Signed)
 Take warfarin 2 tablet tonight, 1 1/2 tablets tomorrow night then increase dose to 1 tablet daily except 1 1/2 tablets on Tuesdays and Fridays. Recheck in 1 wks

## 2023-11-20 ENCOUNTER — Encounter (HOSPITAL_COMMUNITY): Admitting: Occupational Therapy

## 2023-11-23 DIAGNOSIS — I480 Paroxysmal atrial fibrillation: Secondary | ICD-10-CM | POA: Diagnosis not present

## 2023-11-23 DIAGNOSIS — I5022 Chronic systolic (congestive) heart failure: Secondary | ICD-10-CM | POA: Diagnosis not present

## 2023-11-23 DIAGNOSIS — L88 Pyoderma gangrenosum: Secondary | ICD-10-CM | POA: Diagnosis not present

## 2023-11-23 DIAGNOSIS — L732 Hidradenitis suppurativa: Secondary | ICD-10-CM | POA: Diagnosis not present

## 2023-11-25 ENCOUNTER — Ambulatory Visit: Attending: Cardiology | Admitting: *Deleted

## 2023-11-25 DIAGNOSIS — I6349 Cerebral infarction due to embolism of other cerebral artery: Secondary | ICD-10-CM | POA: Diagnosis not present

## 2023-11-25 DIAGNOSIS — I480 Paroxysmal atrial fibrillation: Secondary | ICD-10-CM | POA: Diagnosis not present

## 2023-11-25 DIAGNOSIS — Z952 Presence of prosthetic heart valve: Secondary | ICD-10-CM | POA: Insufficient documentation

## 2023-11-25 DIAGNOSIS — Z5181 Encounter for therapeutic drug level monitoring: Secondary | ICD-10-CM | POA: Diagnosis not present

## 2023-11-25 LAB — POCT INR: INR: 2.8 (ref 2.0–3.0)

## 2023-11-25 NOTE — Patient Instructions (Signed)
 Continue warfarin 1 tablet daily except 1 1/2 tablets on Tuesdays and Fridays. Recheck in 2 wks

## 2023-12-07 ENCOUNTER — Encounter (HOSPITAL_COMMUNITY): Payer: Self-pay | Admitting: Occupational Therapy

## 2023-12-07 ENCOUNTER — Ambulatory Visit (HOSPITAL_COMMUNITY): Attending: Neurology | Admitting: Occupational Therapy

## 2023-12-07 DIAGNOSIS — R278 Other lack of coordination: Secondary | ICD-10-CM | POA: Insufficient documentation

## 2023-12-07 DIAGNOSIS — R29818 Other symptoms and signs involving the nervous system: Secondary | ICD-10-CM | POA: Diagnosis not present

## 2023-12-07 DIAGNOSIS — M79602 Pain in left arm: Secondary | ICD-10-CM | POA: Insufficient documentation

## 2023-12-07 NOTE — Therapy (Signed)
 OUTPATIENT OCCUPATIONAL THERAPY NEURO TREATMENT  Patient Name: Donald [name] MRN: [id number] DOB:1986-01-10, 38 y.o., male Today's Date: 12/07/2023    END OF SESSION:  OT End of Session - 12/07/23 1119     Visit Number 8    Number of Visits 12    Date for OT Re-Evaluation 12/09/23    Authorization Type [employer]    Authorization Time Period 4 visits 5/27-6/25/25    Authorization - Visit Number 1    Authorization - Number of Visits 4    OT Start Time 1121    OT Stop Time 1202    OT Time Calculation (min) 41 min    Activity Tolerance Patient tolerated treatment well    Behavior During Therapy WFL for tasks assessed/performed          Past Medical History:  Diagnosis Date   Anemia    Autoimmune disorder (HCC)    pyoderma gangrenosum   CHF (congestive heart failure) (HCC)    Chronic systolic heart failure (HCC)    a. EF 35-40% by echo in 07/2018 b. EF at 45% by repeat echo in 04/2020   DVT (deep venous thrombosis) (HCC)    h/o   Dysrhythmia    Hidradenitis suppurativa    Mitral regurgitation    a. 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 b. 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HCC)    arthritis   Shock, septic and cardiogenic 12/28/2021   Tricuspid regurgitation    Past Surgical History:  Procedure Laterality Date   CARDIAC CATHETERIZATION     HERNIA REPAIR Right 2018   MITRAL VALVE REPAIR  06/07/2018   [facility] - Dr Lamar Server   MITRAL VALVE REPLACEMENT N/A 06/23/2021   MULTIPLE EXTRACTIONS WITH ALVEOLOPLASTY N/A 11/08/2020   Procedure: EXTRACTION OF TEETH NUMBER ONE, FIFTHTEEN, SIXTEEN, SEVENTEEN, EIGHTTEEN AND THRTY WITH ALVEOLOPLASTY OF LOWER LEFT QUADRANT.;  Surgeon: Celena Lum NOVAK, DMD;   Location: [facility] OR;  Service: Dentistry;  Laterality: N/A;   RIGHT/LEFT HEART CATH AND CORONARY ANGIOGRAPHY N/A 09/11/2020   Procedure: RIGHT/LEFT HEART CATH AND CORONARY ANGIOGRAPHY;  Surgeon: Cherrie Toribio JONELLE, MD;  Location: [facility] INVASIVE CV LAB;  Service: Cardiovascular;  Laterality: N/A;   TEE WITHOUT CARDIOVERSION N/A 05/23/2020   Procedure: TRANSESOPHAGEAL ECHOCARDIOGRAM (TEE) WITH PROPOFOL ;  Surgeon: Alvan Dorn FALCON, MD;  Location: [facility];  Service: Endoscopy;  Laterality: N/A;   Patient Active Problem List   Diagnosis Date Noted   Acute deep vein thrombosis (DVT) of left upper extremity (HCC) 04/02/2023   Anemia 03/12/2022   Nausea 03/12/2022   Hematoma of arm, left, subsequent encounter 03/12/2022   Hypomagnesemia 01/12/2022   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   Left arm pain and swelling 01/10/2022   Cardiomyopathy (HCC)    Paroxysmal atrial fibrillation (HCC) 01/06/2022   Endocarditis of prosthetic mitral valve    AKI (acute kidney injury) (HCC)    Acute febrile illness    Anticoagulated on Coumadin     Dehydration    Elevated lactic acid level    Nausea vomiting and diarrhea    Atrial fibrillation with RVR (HCC) 08/03/2021   Sacral wound 08/03/2021   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   Shock liver 08/03/2021   Upper GI bleed 08/03/2021   Iron  deficiency anemia 08/03/2021   Hyponatremia 08/03/2021   Cardiogenic shock (HCC)    Acute hypoxemic respiratory failure (HCC) 07/24/2021   Encounter for central line placement    H/O mitral valve replacement 06/20/2021   Encounter for therapeutic drug monitoring 06/20/2021   Chronic heart failure (HCC) 05/30/2021   Acute respiratory failure with hypoxia (HCC) 05/30/2021   Hypokalemia 05/30/2021   Elevated brain natriuretic peptide (BNP) level 05/30/2021   Elevated troponin 05/30/2021   Elevated serum creatinine 05/30/2021   Hypoalbuminemia due to protein-calorie malnutrition (HCC) 05/30/2021   Prolonged QT interval  05/30/2021   Transaminitis 05/30/2021   Paroxysmal atrial flutter (HCC) 05/30/2021   Acute systolic CHF (congestive heart failure) (HCC) 05/30/2021   Acute on chronic systolic and diastolic heart failure, NYHA class 3 (HCC) 05/30/2021   Caries    Chronic periodontitis    Retained dental root    Tricuspid regurgitation    Mitral regurgitation    Acute on chronic HFrEF (heart failure with reduced ejection fraction) (HCC)    Autoimmune disorder (HCC)    Seronegative spondylitis (HCC)    PCP: Silvio Ramp, FNP  REFERRING PROVIDER: Dr. 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  PERTINENT HISTORY: Pt is a 38 y/o male presenting with LUE weakness present for approximately 2 years. Weakness began with hematoma on the nerve, since November pt reports improvement in sensation and now has movement in digits 3-5. 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 A brachial plexopathy was felt to be less likely given abnormalities were all distal to the elbow. NMUS showed no definite abnormalities but the left median and ulnar nerves traverse the area of old hematoma in left upper arm and are lost to ultrasound in this area.   PRECAUTIONS: None  WEIGHT BEARING RESTRICTIONS: No  PAIN:  Are you having pain? No  FALLS: Has patient fallen in last 6 months? No  PLOF: Independent  PATIENT GOALS: To have use of the LUE  OBJECTIVE:  Note: Objective measures were completed at Evaluation unless  otherwise noted.  HAND DOMINANCE: Right  ADLs: Overall ADLs: Pt is able to use the left shoulder for reaching, unable to use the LUE functionally for ADLs. Has edema in the LUE. Pt is unable to pronate or supinate the left forearm/wrist/hand, is positioned in neutral. Pt is unable to grasp objects or make a fist, cannot use the left hand functionally. Pt is using RUE for all tasks.    FUNCTIONAL OUTCOME MEASURES: Upper Extremity Functional Scale (UEFS): 2/80  UPPER EXTREMITY ROM:      Assessed in sitting, er/IR adducted  Active ROM Left eval Left 5/23 Re assessment   Shoulder flexion 80 110  Shoulder abduction 66 98  Shoulder internal rotation 90 90  Shoulder external rotation 32 45  Elbow flexion 115 130  Elbow extension -34 -30  Wrist flexion 10 12  Wrist extension 20 30  Wrist ulnar deviation 0 0  Wrist radial deviation 0 0  Wrist pronation 2 3  Wrist supination 0 3  (  Blank rows = not tested)  UPPER EXTREMITY MMT:       Assessed via observation due to pain and weakness limiting MMT  MMT Left eval Left 5/23 Re assessment   Shoulder flexion 3-/5 3-/5  Shoulder abduction 3-/5 3-/5  Shoulder internal rotation 3/5 4-/5  Shoulder external rotation 3-/5 4-/5  Elbow flexion 3-/5 4-/5  Elbow extension 3/5 3/5  Wrist flexion 2-/5 2-/5  Wrist extension 2-/5 3-/5  Wrist ulnar deviation 1/5 1/5  Wrist radial deviation 1/5 1/5  Wrist pronation 2-/5 2-5  Wrist supination 2-/5 2-5  (Blank rows = not tested)  HAND FUNCTION: Unable to test  COORDINATION: Unable to test  SENSATION: Semmes Weinstein monofilament testing: volar and dorsal forearm at 2.83-3.22; wrist and dorsal hand 3.61, palm and digits at 4.56-6.65-loss of protective sensation   EDEMA: generalized edema in hand and digits  OBSERVATIONS: Pt with generalized edema, hand is very cold to the touch. Pt reports this is his baseline. Can wiggle the 3rd-5th digits.                                                                                                                              TREATMENT DATE:   12/07/23 -AA/ROM: seated, L hand wrapped around dowel, shoulder flexion, horizontal abduction, protraction, er/IR, x10 -A/ROM: seated, elbow flexion/extension, shoulder flexion, abduction, protraction, horizontal abduction, er/IR, x10 -A/ROM: forearm supination/pronation with arm in full extension down by his side, x10 -Wrist ROM: seated, flexion/extension, ulnar/radial deviation, supination/pronation, x10, on table -Digit Stretching: finger extension, flexion, 10-15 sec holds -mild resistance against each finger in flexion along each joint  11/06/23 -Weightbearing: WB through forearms doing scapular push ups 1 min 2 sets, WB through forearms scrubbing table with towel going into shoulder flexion and pulling towel back towards   -A/ROM: standing in hammer curl position-elbow flexion, extension, 10 reps -A/ROM: forearm supination/pronation with arm in full extension down by his side, 10 reps  11/03/23 -Weightbearing: pt scrubbing tabletop with a towel, working to put pressure through the LUE throughout task -A/ROM: forearm supination/pronation with arm in full extension down by his side, 10 reps -A/ROM: standing in hammer curl position-elbow flexion, extension, 10 reps -A/ROM: wrist flexion/extension, ulnar/radial deviation, forearm supination/pronation, 10 reps forearm on foam pad and right arm propped under the left -Sponges: pt working to grasp one sponge at a time and transfer to bucket, increased time, grasping in loose lateral pinch style. Increased time required for task, multiple attempts due to dropping sponges. Completed 20 sponges -NMES: russian, 34 ma CC; 10' targeting radial nerve activation and thumb/index finger mobility -After NMES, pt able to complete thumb abduction 10 reps   PATIENT EDUCATION: Education details: Digit stretching and ROM of digits Person educated:  Patient Education method: Explanation, Demonstration, and Handouts Education comprehension: verbalized understanding and returned demonstration  HOME EXERCISE PROGRAM: 09/22/23: weightbearing on forearm, wrist self-ROM 10/09/23: Wrist A/ROM   GOALS: Goals reviewed with patient? Yes  SHORT  TERM GOALS: Target date: 10/22/23  Pt will be provided with and educated on HEP to improve functional use of the LUE during ADLs.   Goal status: IN PROGRESS  2.  Pt will be educated on edema management strategies to decrease edema and improve fluid mobility in the left hand.    Goal status: IN PROGRESS  3.  Pt will be demonstrate independence in weightbearing strategies to improve mobility and progress nerve healing in the LUE.   Goal status: IN PROGRESS    LONG TERM GOALS: Target date: 11/21/23  Pt will increase LUE A/ROM by at least 20 degrees to improve mobility required for use as assist during dressing tasks.   Goal status: IN PROGRESS  2.  Pt will increase LUE strength to 3/5 or greater to improve ability to perform functional reaching during bathing and grooming tasks.   Goal status: IN PROGRESS  3.  Pt will increase A/ROM of digits to make a 50% fist or greater, to improve ability to use left hand to support lightweight objects when carrying.  Goal status: IN PROGRESS  4.  Pt will decrease pain in LUE to 3/10 or less to improve ability to sleep for 2+ hours without waking due to pain.  Goal status: IN PROGRESS  5.  Pt will be educated on available AE and DME for use to improve independence and success with ADLs.   Goal status: IN PROGRESS    ASSESSMENT:  CLINICAL IMPRESSION: This session pt reports feeling weaker than he has in the past therapy sessions. Despite weakness, he demonstrated improved ROM and overall mobility in all joints of LUE. His shoulder A/ROM is achieving over 70% of full ROM and wrist is incrementally improving from neutral positioning. OT had pt working on  digit stretches and active flexion/extension against light resistance. Verbal cuing and hands on assist provided throughout session for positioning and technique.   PERFORMANCE DEFICITS: in functional skills including ADLs, IADLs, coordination, dexterity, proprioception, sensation, edema, ROM, strength, pain, flexibility, Fine motor control, Gross motor control, and UE functional use     PLAN:  OT FREQUENCY: 1x/week for 4 weeks then increase to 2x/week  OT DURATION: 8 weeks  PLANNED INTERVENTIONS: 97168 OT Re-evaluation, 97535 self care/ADL training, 02889 therapeutic exercise, 97530 therapeutic activity, 97112 neuromuscular re-education, 97140 manual therapy, 97035 ultrasound, 97018 paraffin, 02989 moist heat, 97032 electrical stimulation (manual), 97760 Orthotics management and training, 02239 Splinting (initial encounter), [id number] Subsequent splinting/medication, passive range of motion, patient/family education, and DME and/or AE instructions  CONSULTED AND AGREED WITH PLAN OF CARE: Patient  PLAN FOR NEXT SESSION: REASSESSMENT/PROGRESS NOTE   Valentin Nightingale, OTR/L [phone] 12/07/2023, 12:30 PM       Managed Medicaid Authorization Request  Visit Dx Codes: R29.818, M79.602, R27.8  Functional Tool Score: 07-29-78  For all possible CPT codes, reference the Planned Interventions line above.     Check all conditions that are expected to impact treatment: {Conditions expected to impact treatment:None of these apply   If treatment provided at initial evaluation, no treatment charged due to lack of authorization.

## 2023-12-08 ENCOUNTER — Ambulatory Visit: Attending: Cardiology | Admitting: *Deleted

## 2023-12-08 DIAGNOSIS — Z952 Presence of prosthetic heart valve: Secondary | ICD-10-CM | POA: Diagnosis not present

## 2023-12-08 DIAGNOSIS — I480 Paroxysmal atrial fibrillation: Secondary | ICD-10-CM | POA: Insufficient documentation

## 2023-12-08 DIAGNOSIS — Z5181 Encounter for therapeutic drug level monitoring: Secondary | ICD-10-CM | POA: Diagnosis not present

## 2023-12-08 DIAGNOSIS — I6349 Cerebral infarction due to embolism of other cerebral artery: Secondary | ICD-10-CM | POA: Diagnosis not present

## 2023-12-08 LAB — POCT INR: INR: 7.5 — AB (ref 2.0–3.0)

## 2023-12-08 NOTE — Progress Notes (Signed)
Please see anticoagulation encounter.

## 2023-12-08 NOTE — Patient Instructions (Addendum)
 Hold warfarin x 4 days then resume 1 tablet daily except 1 1/2 tablets on Tuesdays and Fridays. Pt denies S/S of bleeding or excessive bruising.  Bleeding and fall precautions discussed with pt and he verbalized understanding. Recheck in 1 wk

## 2023-12-09 ENCOUNTER — Ambulatory Visit (HOSPITAL_COMMUNITY): Admitting: Occupational Therapy

## 2023-12-09 ENCOUNTER — Encounter (HOSPITAL_COMMUNITY): Payer: Self-pay | Admitting: Occupational Therapy

## 2023-12-09 DIAGNOSIS — R278 Other lack of coordination: Secondary | ICD-10-CM

## 2023-12-09 DIAGNOSIS — R29818 Other symptoms and signs involving the nervous system: Secondary | ICD-10-CM

## 2023-12-09 DIAGNOSIS — M79602 Pain in left arm: Secondary | ICD-10-CM

## 2023-12-09 NOTE — Therapy (Unsigned)
 OUTPATIENT OCCUPATIONAL THERAPY NEURO TREATMENT REASSESSMENT/RECERTIFICATION  Patient Name: Donald [name] MRN: [id number] DOB:1985-09-15, 38 y.o., male Today's Date: 12/09/2023  Progress Note Reporting Period 11/06/23 to 12/09/23  See note below for Objective Data and Assessment of Progress/Goals.    END OF SESSION:  OT End of Session - 12/09/23 1339     Visit Number 9    Number of Visits 12    Date for OT Re-Evaluation 12/09/23    Authorization Type [employer]    Authorization Time Period 4 visits 5/27-6/25/25    Authorization - Visit Number 2    Authorization - Number of Visits 4    OT Start Time 1121    OT Stop Time 1206    OT Time Calculation (min) 45 min    Activity Tolerance Patient tolerated treatment well    Behavior During Therapy WFL for tasks assessed/performed          Past Medical History:  Diagnosis Date   Anemia    Autoimmune disorder (HCC)    pyoderma gangrenosum   CHF (congestive heart failure) (HCC)    Chronic systolic heart failure (HCC)    a. EF 35-40% by echo in 07/2018 b. EF at 45% by repeat echo in 04/2020   DVT (deep venous thrombosis) (HCC)    h/o   Dysrhythmia    Hidradenitis suppurativa    Mitral regurgitation    a. 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 b. 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HCC)    arthritis   Shock, septic and cardiogenic 12/28/2021   Tricuspid regurgitation    Past Surgical History:  Procedure Laterality Date   CARDIAC CATHETERIZATION     HERNIA REPAIR Right 2018   MITRAL VALVE REPAIR  06/07/2018   [facility] - Dr Lamar Server   MITRAL VALVE REPLACEMENT N/A 06/23/2021   MULTIPLE EXTRACTIONS WITH ALVEOLOPLASTY N/A 11/08/2020   Procedure: EXTRACTION OF  TEETH NUMBER ONE, FIFTHTEEN, SIXTEEN, SEVENTEEN, EIGHTTEEN AND THRTY WITH ALVEOLOPLASTY OF LOWER LEFT QUADRANT.;  Surgeon: Celena Lum NOVAK, DMD;  Location: [facility] OR;  Service: Dentistry;  Laterality: N/A;   RIGHT/LEFT HEART CATH AND CORONARY ANGIOGRAPHY N/A 09/11/2020   Procedure: RIGHT/LEFT HEART CATH AND CORONARY ANGIOGRAPHY;  Surgeon: Cherrie Toribio JONELLE, MD;  Location: [facility] INVASIVE CV LAB;  Service: Cardiovascular;  Laterality: N/A;   TEE WITHOUT CARDIOVERSION N/A 05/23/2020   Procedure: TRANSESOPHAGEAL ECHOCARDIOGRAM (TEE) WITH PROPOFOL ;  Surgeon: Alvan Dorn FALCON, MD;  Location: [facility] ENDO SUITE;  Service: Endoscopy;  Laterality: N/A;   Patient Active Problem List   Diagnosis Date Noted   Acute deep vein thrombosis (DVT) of left upper extremity (HCC) 04/02/2023   Anemia 03/12/2022   Nausea 03/12/2022   Hematoma of arm, left, subsequent encounter 03/12/2022   Hypomagnesemia 01/12/2022   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   Left arm pain and swelling 01/10/2022   Cardiomyopathy (HCC)    Paroxysmal atrial fibrillation (HCC) 01/06/2022   Endocarditis of prosthetic mitral valve    AKI (acute kidney injury) (HCC)    Acute febrile illness    Anticoagulated on Coumadin     Dehydration    Elevated lactic acid level    Nausea vomiting and diarrhea    Atrial fibrillation with RVR (HCC) 08/03/2021   Sacral wound  08/03/2021   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   Shock liver 08/03/2021   Upper GI bleed 08/03/2021   Iron deficiency anemia 08/03/2021   Hyponatremia 08/03/2021   Cardiogenic shock (HCC)    Acute hypoxemic respiratory failure (HCC) 07/24/2021   Encounter for central line placement    H/O mitral valve replacement 06/20/2021   Encounter for therapeutic drug monitoring 06/20/2021   Chronic heart failure (HCC) 05/30/2021   Acute respiratory failure with hypoxia (HCC) 05/30/2021   Hypokalemia 05/30/2021   Elevated brain natriuretic peptide (BNP) level 05/30/2021   Elevated troponin  05/30/2021   Elevated serum creatinine 05/30/2021   Hypoalbuminemia due to protein-calorie malnutrition (HCC) 05/30/2021   Prolonged QT interval 05/30/2021   Transaminitis 05/30/2021   Paroxysmal atrial flutter (HCC) 05/30/2021   Acute systolic CHF (congestive heart failure) (HCC) 05/30/2021   Acute on chronic systolic and diastolic heart failure, NYHA class 3 (HCC) 05/30/2021   Caries    Chronic periodontitis    Retained dental root    Tricuspid regurgitation    Mitral regurgitation    Acute on chronic HFrEF (heart failure with reduced ejection fraction) (HCC)    Autoimmune disorder (HCC)    Seronegative spondylitis (HCC)    PCP: Silvio Ramp, FNP  REFERRING PROVIDER: Dr. 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 Weakness began with hematoma on the nerve, since November pt reports improvement in sensation and now has movement in digits 3-5. 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 A brachial plexopathy was felt to be less likely given abnormalities were all distal to the elbow. NMUS showed no definite abnormalities but the left median and ulnar nerves traverse the area of old hematoma in left upper arm and are lost to ultrasound in this area.   PRECAUTIONS: None  WEIGHT BEARING RESTRICTIONS: No  PAIN:  Are you having pain? No  FALLS: Has patient  fallen in last 6 months? No  PLOF: Independent  PATIENT GOALS: To have use of the LUE  OBJECTIVE:  Note: Objective measures were completed at Evaluation unless otherwise noted.  HAND DOMINANCE: Right  ADLs: Overall ADLs: Pt is able to use the left shoulder for reaching, unable to use the LUE functionally for ADLs. Has edema in the LUE. Pt is unable to pronate or supinate the left forearm/wrist/hand, is positioned in neutral. Pt is unable to grasp objects or make a fist, cannot use the left hand functionally. Pt is using RUE for all tasks.    FUNCTIONAL OUTCOME MEASURES: Upper Extremity Functional Scale (UEFS): 78/80 12/09/23: 68/80 - 85%  UPPER EXTREMITY ROM:      Assessed in sitting, er/IR adducted  Active ROM Left eval Left 5/23 Re assessment  Left 12/09/23  Shoulder flexion 80 110 122  Shoulder abduction 66 98 98  Shoulder internal rotation 90 90 90  Shoulder external rotation 32 45 54  Elbow flexion 115 130 142  Elbow extension -34 -  30 -16  Wrist flexion 10 12 24   Wrist extension 20 30 36  Wrist ulnar deviation 0 0 0  Wrist radial deviation 0 0 0  Wrist pronation 2 3 WFL  Wrist supination 0 3 16  (Blank rows = not tested)  UPPER EXTREMITY MMT:       Assessed via observation due to pain and weakness limiting MMT  MMT Left eval Left 5/23 Re assessment  Left 12/09/23  Shoulder flexion 3-/5 3-/5 4/5  Shoulder abduction 3-/5 3-/5 4/5  Shoulder internal rotation 3/5 4-/5 4+/5  Shoulder external rotation 3-/5 4-/5 4/5  Elbow flexion 3-/5 4-/5 4/5  Elbow extension 3/5 3/5 5/5  Wrist flexion 2-/5 2-/5 4-/5  Wrist extension 2-/5 3-/5 3/5  Wrist ulnar deviation 1/5 1/5 1/5  Wrist radial deviation 1/5 1/5 1/5  Wrist pronation 2-/5 2-5 3+/5  Wrist supination 2-/5 2-5 4-/5  (Blank rows = not tested)  HAND FUNCTION: Unable to test  COORDINATION: Unable to test  SENSATION: Semmes Weinstein monofilament testing: volar and dorsal forearm at 2.83-3.22; wrist and  dorsal hand 3.61, palm and digits at 4.56-6.65-loss of protective sensation   EDEMA: generalized edema in hand and digits  OBSERVATIONS: Pt with generalized edema, hand is very cold to the touch. Pt reports this is his baseline. Can wiggle the 3rd-5th digits.                                                                                                                             TREATMENT DATE:   12/09/23 -Scapular ROM: elevation/depression, retraction, x10 -A/ROM: seated, shoulder flexion, abduction, protraction, horizontal abduction, er/IR, elbow flexion/extension, x10 -Wrist ROM: seated, flexion/extension, ulnar/radial deviation, supination/pronation, x10, on table -Theraputty: roll into a ball, flatten, roll into a log, grip with full hand, mod assist for entirety of exercise  12/07/23 -AA/ROM: seated, L hand wrapped around dowel, shoulder flexion, horizontal abduction, protraction, er/IR, x10 -A/ROM: seated, elbow flexion/extension, shoulder flexion, abduction, protraction, horizontal abduction, er/IR, x10 -A/ROM: forearm supination/pronation with arm in full extension down by his side, x10 -Wrist ROM: seated, flexion/extension, ulnar/radial deviation, supination/pronation, x10, on table -Digit Stretching: finger extension, flexion, 10-15 sec holds -mild resistance against each finger in flexion along each joint  11/06/23 -Weightbearing: WB through forearms doing scapular push ups 1 min 2 sets, WB through forearms scrubbing table with towel going into shoulder flexion and pulling towel back towards   -A/ROM: standing in hammer curl position-elbow flexion, extension, 10 reps -A/ROM: forearm supination/pronation with arm in full extension down by his side, 10 reps  11/03/23 -Weightbearing: pt scrubbing tabletop with a towel, working to put pressure through the LUE throughout task -A/ROM: forearm supination/pronation with arm in full extension down by his side, 10 reps -A/ROM: standing  in hammer curl position-elbow flexion, extension, 10 reps -A/ROM: wrist flexion/extension, ulnar/radial deviation, forearm supination/pronation, 10 reps forearm on foam pad and right arm propped under the left -Sponges: pt working to grasp one sponge  at a time and transfer to bucket, increased time, grasping in loose lateral pinch style. Increased time required for task, multiple attempts due to dropping sponges. Completed 20 sponges -NMES: russian, 34 ma CC; 10' targeting radial nerve activation and thumb/index finger mobility -After NMES, pt able to complete thumb abduction 10 reps   PATIENT EDUCATION: Education details: Buyer, retail Person educated: Patient Education method: Explanation, Demonstration, and Handouts Education comprehension: verbalized understanding and returned demonstration  HOME EXERCISE PROGRAM: 09/22/23: weightbearing on forearm, wrist self-ROM 10/09/23: Wrist A/ROM 5/20: Digit ROM and stretching 6/25: Gripping towel roll   GOALS: Goals reviewed with patient? Yes  SHORT TERM GOALS: Target date: 10/22/23  Pt will be provided with and educated on HEP to improve functional use of the LUE during ADLs.   Goal status: IN PROGRESS  2.  Pt will be educated on edema management strategies to decrease edema and improve fluid mobility in the left hand.    Goal status: IN PROGRESS  3.  Pt will be demonstrate independence in weightbearing strategies to improve mobility and progress nerve healing in the LUE.   Goal status: MET    LONG TERM GOALS: Target date: 11/21/23  Pt will increase LUE A/ROM by at least 20 degrees to improve mobility required for use as assist during dressing tasks.   Goal status: IN PROGRESS (Met Shoulder A/ROM)  2.  Pt will increase LUE strength to 3/5 or greater to improve ability to perform functional reaching during bathing and grooming tasks.   Goal status: IN PROGRESS (Met except for wrist mobility)  3.  Pt will increase A/ROM of  digits to make a 50% fist or greater, to improve ability to use left hand to support lightweight objects when carrying.  Goal status: IN PROGRESS  4.  Pt will decrease pain in LUE to 3/10 or less to improve ability to sleep for 2+ hours without waking due to pain.  Goal status: IN PROGRESS  5.  Pt will be educated on available AE and DME for use to improve independence and success with ADLs.   Goal status: IN PROGRESS    ASSESSMENT:  CLINICAL IMPRESSION: Pt completing his reassessment this session. He is demonstrated good improvements with all active ROM and strength, as well as improved ADL/IADL abilities per UEFs. He started with theraputty this session, incorporating both weight bearing and muscle control, working on moving and manipulating the putty. Pt's overall control appears to be improving as well, however his hand continues to have limited mobility. OT Providing hands on assist as needed, as well as verbal and tactile cuing for positioning and technique.   PERFORMANCE DEFICITS: in functional skills including ADLs, IADLs, coordination, dexterity, proprioception, sensation, edema, ROM, strength, pain, flexibility, Fine motor control, Gross motor control, and UE functional use     PLAN:  OT FREQUENCY: 1x/week for 4 weeks then increase to 2x/week  OT DURATION: 8 weeks  PLANNED INTERVENTIONS: 97168 OT Re-evaluation, 97535 self care/ADL training, 02889 therapeutic exercise, 97530 therapeutic activity, 97112 neuromuscular re-education, 97140 manual therapy, 97035 ultrasound, 97018 paraffin, 02989 moist heat, 97032 electrical stimulation (manual), 97760 Orthotics management and training, 02239 Splinting (initial encounter), [id number] Subsequent splinting/medication, passive range of motion, patient/family education, and DME and/or AE instructions  CONSULTED AND AGREED WITH PLAN OF CARE: Patient  PLAN FOR NEXT SESSION: REASSESSMENT/PROGRESS NOTE   Valentin Nightingale,  OTR/L [phone] 12/09/2023, 1:40 PM       Managed Medicaid Authorization Request  Visit Dx Codes: R29.818, M79.602, R27.8  Functional Tool Score: UEFs 68/80 - 85%  For all possible CPT codes, reference the Planned Interventions line above.     Check all conditions that are expected to impact treatment: {Conditions expected to impact treatment:None of these apply   If treatment provided at initial evaluation, no treatment charged due to lack of authorization.

## 2023-12-14 ENCOUNTER — Ambulatory Visit

## 2023-12-15 ENCOUNTER — Telehealth (HOSPITAL_COMMUNITY): Payer: Self-pay | Admitting: Cardiology

## 2023-12-15 ENCOUNTER — Telehealth: Payer: Self-pay | Admitting: Cardiology

## 2023-12-15 NOTE — Telephone Encounter (Signed)
 Patient called to get confirmation from HF provider, reports dermatologist would like to start humira and wanted get input since medication could affect heart

## 2023-12-15 NOTE — Telephone Encounter (Signed)
 I informed patient that after Dr.Branch reviews and comments that I will call him back. He is aware of our Pharm-D comment.

## 2023-12-15 NOTE — Telephone Encounter (Signed)
 Precautions with Humira:  Cardiovascular: Worsening or new onset congestive heart failure has been reported with tumor necrosis factor blockers, including adalimumab products; monitoring is recommended in patients with heart failure.  Patient has heart failure and with last EF 25-30%. Would need close monitoring.

## 2023-12-15 NOTE — Telephone Encounter (Signed)
 Office is requesting a callback regarding them wanting to know if it's ok for pt to start Humira medication. Please advise

## 2023-12-15 NOTE — Telephone Encounter (Signed)
 Mr. Donald [name] has been diagnosed with HFrEF since 2020 and seen in the HF Clinic since 2023. Most recent echo is from 07/23/23 with EF 25-30%. Seen by Dr. Cherrie in [facility] 07/2023 and it was noted he was doing well with NYHA I-II symptoms. At that visit, his HF was noted to be stable, but it was documented he would need transplant evaluation if he deteriorates.  He has an extensive cardiac history but also an extensive dermatologic history. Per notes from [facility] dermatology on 11/23/23, he has persistent skin lesions due to hidradenitis suppurativa and pyoderma gangrenosum, with active areas on his shoulder, armpits, and backside. His insurance continues to deny Stelara and he is currently being treated with prednisone  and doxycycline . He has tried and failed multiple therapies. He was previously on Humira 08/2018-2021 with good effect, but this was discontinued due to risk of HF-related side effects.   Based off his extensive cardiac history and reduced ejection fraction, he is at moderate-high risk of worsening heart failure if he starts Humira. However, we recognize that his dermatologic issues are severe and that they are chronic, debilitating inflammatory conditions. If the decision is made to start Humira, would recommend close monitoring for any signs of fluid retention or worsening heart failure symptoms. Would also recommend more frequent echos, especially when first initiating therapy. Given the complexity, would defer to Dr. Bensimhon for final decision.

## 2023-12-15 NOTE — Telephone Encounter (Signed)
 I will route to PharmD and Dr.Branch for review.

## 2023-12-15 NOTE — Telephone Encounter (Signed)
 Left message for Dr.Negar to return call and patient notified of Dr.Branch's recommendations.

## 2023-12-16 ENCOUNTER — Encounter (HOSPITAL_COMMUNITY): Admitting: Occupational Therapy

## 2023-12-16 ENCOUNTER — Telehealth (HOSPITAL_COMMUNITY): Payer: Self-pay | Admitting: Occupational Therapy

## 2023-12-16 NOTE — Telephone Encounter (Signed)
 Pt aware and voiced understanding Reports he has not started medication, advised to schedule follow up with 30 days of starting meds

## 2023-12-16 NOTE — Telephone Encounter (Signed)
 This OT spoke with pt regarding No Show on 12/16/23.  Pt was not sure if visit had been approved, therefore did not come in. OT reminded him of next appointment on 12/21/23.   Valentin Nightingale, OTR/L [facility] [phone]

## 2023-12-17 NOTE — Telephone Encounter (Signed)
 Dr. Sherrian Palms returning call

## 2023-12-17 NOTE — Telephone Encounter (Addendum)
 Spoke with Dr. Peder - notified of reply from our pharm-d & Dr. Alvan.   She would like for us  to send message to heart failure doc as well, states they are running out of medication options for patient.    Will forward message to Dr. Bensimhon for his review.    Fax:  [phone]  May fax note back or call provider directly.

## 2023-12-17 NOTE — Telephone Encounter (Signed)
 Noted - will fax this note to Dr. Peder.

## 2023-12-21 ENCOUNTER — Encounter (HOSPITAL_COMMUNITY): Admitting: Occupational Therapy

## 2023-12-21 ENCOUNTER — Encounter

## 2023-12-23 ENCOUNTER — Ambulatory Visit: Attending: Cardiology | Admitting: *Deleted

## 2023-12-23 DIAGNOSIS — Z5181 Encounter for therapeutic drug level monitoring: Secondary | ICD-10-CM | POA: Insufficient documentation

## 2023-12-23 DIAGNOSIS — I480 Paroxysmal atrial fibrillation: Secondary | ICD-10-CM | POA: Diagnosis not present

## 2023-12-23 DIAGNOSIS — Z952 Presence of prosthetic heart valve: Secondary | ICD-10-CM | POA: Insufficient documentation

## 2023-12-23 DIAGNOSIS — I6349 Cerebral infarction due to embolism of other cerebral artery: Secondary | ICD-10-CM | POA: Insufficient documentation

## 2023-12-23 LAB — POCT INR: INR: 2.1 (ref 2.0–3.0)

## 2023-12-23 NOTE — Patient Instructions (Signed)
 Take warfarin 2 tablets tonight then resume 1 tablet daily except 1 1/2 tablets on Tuesdays and Fridays. Recheck in 3 wks

## 2023-12-23 NOTE — Progress Notes (Signed)
Please see anticoagulation encounter.

## 2023-12-25 ENCOUNTER — Encounter (HOSPITAL_COMMUNITY): Admitting: Occupational Therapy

## 2023-12-25 ENCOUNTER — Telehealth (HOSPITAL_COMMUNITY): Payer: Self-pay | Admitting: Occupational Therapy

## 2023-12-25 NOTE — Telephone Encounter (Signed)
 This OT spoke with patient regarding todays visit (12/25/23).  Insurance Auth is still pending from last session and this session needs to be rescheduled once insurance is authorized.  Pt understanding and agreeable to rescheduling.   Valentin Nightingale, OTR/L [facility] [phone]

## 2023-12-30 NOTE — Telephone Encounter (Signed)
 Dr. Sherrian Palms at [facility] is calling to follow up in regard to this medication. Please advise.

## 2023-12-30 NOTE — Telephone Encounter (Signed)
 Faxed to [phone]

## 2023-12-31 ENCOUNTER — Encounter (HOSPITAL_COMMUNITY): Payer: Self-pay | Admitting: Occupational Therapy

## 2024-01-01 ENCOUNTER — Telehealth (HOSPITAL_COMMUNITY): Payer: Self-pay | Admitting: Occupational Therapy

## 2024-01-01 NOTE — Telephone Encounter (Signed)
 This OT spoke with pt regarding his No Show on 7/17. He was told that he had no more insurance auth, however he has been approved for 4 more visits. OT informed patient of his newest insurance auth and requested he schedule with front office for his visits. Pt in agreement.   Valentin Nightingale, OTR/L [facility] [phone]

## 2024-01-13 ENCOUNTER — Encounter

## 2024-01-18 ENCOUNTER — Ambulatory Visit: Attending: Cardiology | Admitting: *Deleted

## 2024-01-18 DIAGNOSIS — Z952 Presence of prosthetic heart valve: Secondary | ICD-10-CM | POA: Insufficient documentation

## 2024-01-18 DIAGNOSIS — I480 Paroxysmal atrial fibrillation: Secondary | ICD-10-CM | POA: Insufficient documentation

## 2024-01-18 DIAGNOSIS — Z5181 Encounter for therapeutic drug level monitoring: Secondary | ICD-10-CM | POA: Insufficient documentation

## 2024-01-18 DIAGNOSIS — I6349 Cerebral infarction due to embolism of other cerebral artery: Secondary | ICD-10-CM | POA: Diagnosis not present

## 2024-01-18 LAB — POCT INR: INR: 3.7 — AB (ref 2.0–3.0)

## 2024-01-18 NOTE — Patient Instructions (Signed)
 Hold warfarin tonight then resume 2 tablets tonight then resume 1 tablet daily except 1 1/2 tablets on Tuesdays and Fridays. Recheck in 3 wks

## 2024-01-18 NOTE — Progress Notes (Signed)
 INR 3.7; Please see anticoagulation encounter

## 2024-01-19 ENCOUNTER — Telehealth (HOSPITAL_COMMUNITY): Payer: Self-pay

## 2024-01-19 NOTE — Progress Notes (Signed)
 Advanced Heart Failure Clinic Note   PCP: Bucio, Silvio BROCKS, FNP Primary Cardiologist: Alvan Carrier, MD  HF Cardiologist: Dr. Cherrie   HPI: Donald [name] is a 38 y.o. male with history of mitral regurgitation (s/p MV repair with resection of ruptured anterior papillary muscle and reconstruction of papillary chord and placement of annuloplasty ring in 2019). Had recurrent MR and underwent MVR w/ mechanical valve on 06/10/21 by Dr. Marilynne at [facility].   Admitted 12/22 for a/c CHF and evidence of low output and AKI. Echo with EF 20-25% with severe MR/mod MS and severe RV dysfunction. PICC place, started on empiric milrinone . AKI resolved with addition of milrinone . He underwent MVR w/ mechanical valve on 12/26 by Dr. Marilynne at [facility].  Admitted 2/23 with low output HF in setting of AF. Echo showed LVEF <20%, RV severely reduced. Mechanical MV ok, Gradient 3 mmHg. Started on milrinone  and amio gtt. Diuresed w/ IV Lasix . Diuresed and extubated. Converted to NSR, amio and milrinone  weaned off. GDMT titrated. Discharged home, weight was 198 lb.   Echo 10/11/21: EF 25%. Mild RV dysfunction.  Admitted 7/23 with cardiogenic shock. Echo EF 15%, RV sev HK, MVR thick, mean gradient 5-6. Intubated. TEE with severe biventricular failure with vegetation on mechanical MVR. Had CODE stroke. MRI w/ several small acute right MCA territory ischemic strokes, suggestive of a cardioembolic etiology.  Had upper extremity swelling. 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  Ortho consulted and rec transfer to [facility] for Ortho Onc. While at [facility], UE mass found to be hematoma. Kidney function declined and he was transiently on hemodialysis.   Seen in ED 04/05/22 with palpitations. Found to be in SVT vs AFL, and hypotensive. Underwent emergent DCCV to NSR, BP improved.  Echo 12/23 EF 25-30% RV sev HK MVR ok mean gradient 6 (? Mild PPM)  Echo 2/25: 25-30% RV mod to severely down MV ok    Today he returns for HF  follow up. Overall feeling fine. He has SOB walking fast on flat ground. Feels fatigue. Looks after his active 1  year old son. Denies palpitations, abnormal bleeding, CP, dizziness, edema, or PND/Orthopnea. Not weighing at home. Taking all medications. Remains on prednisone  & weigh creeping up, hopes to get on biologics.   Cardiac Studies: - Echo 2/25: 25-30% RV mod to severely down MV ok   - Echo (12/23) EF 25-30%, RV severely reduced - TEE (7/23): LVEF < 20%, RV severely reduced, mean mitral valve gradient 8 mmHg with trivial MR + vegetation on MV - Echo (7/23): EF < 15%, RV severely dow - Echo (4/23): EF 25%, mild RV dysfunction - Echo (2/23): EF < 20%, severe LV dysfunction with global HK, grade II DD, RV severely reduced, S/p mitral valve repair. MV mean gradient 3 mmHg  - Echo (12/22): EF 20-25%, severe LV dysfunction with global HK, mild LVH, RV  moderately reduced, elevated MV gradient , mild AI - R/LHC (09/11/20):  RA = 7 RV = 49/10 PA = 48/16 (32) PCW = 17 (v=32) Fick cardiac output/index = 5.3/2.5 PVR = 2.9 WU Ao sat = 99% PA sat = 71%, 70% High SVC sat =  75% 1. Normal coronary arteries 2. NICM EF 30-35% 3. Severe MR with prominent v-waves in PCWP tracing 4. Mild pulmonary venous HTN with normal CO  Past Medical History:  Diagnosis Date   Anemia    Autoimmune disorder (HCC)    pyoderma gangrenosum   CHF (congestive heart failure) (HCC)  Chronic systolic heart failure (HCC)    a. EF 35-40% by echo in 07/2018 b. EF at 45% by repeat echo in 04/2020   DVT (deep venous thrombosis) (HCC)    h/o   Dysrhythmia    Hidradenitis suppurativa    Mitral regurgitation    a. 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 b. 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   Mitral stenosis    Myocardial infarction (HCC)    Paroxysmal atrial flutter (HCC)    Pyoderma gangrenosa    Seronegative  spondylitis (HCC)    arthritis   Shock, septic and cardiogenic 12/28/2021   Tricuspid regurgitation    Current Outpatient Medications  Medication Sig Dispense Refill   amiodarone  (PACERONE ) 200 MG tablet Take 1 tablet by mouth once daily 30 tablet 6   FARXIGA  10 MG TABS tablet Take 1 tablet by mouth once daily 30 tablet 5   furosemide  (LASIX ) 80 MG tablet Take 1 tablet (80 mg total) by mouth daily. 90 tablet 1   gabapentin  (NEURONTIN ) 300 MG capsule Take 600 mg by mouth 2 (two) times daily.     metformin (FORTAMET) 500 MG (OSM) 24 hr tablet Take 500 mg by mouth daily with breakfast.     pantoprazole  (PROTONIX ) 40 MG tablet Take 1 tablet (40 mg total) by mouth daily. 30 tablet 8   potassium chloride  SA (KLOR-CON  M) 20 MEQ tablet Take 1 tablet (20 mEq total) by mouth daily. 30 tablet 6   sacubitril -valsartan  (ENTRESTO ) 24-26 MG Take 1 tablet by mouth 2 (two) times daily. 180 tablet 1   sertraline  (ZOLOFT ) 50 MG tablet Take 1 tablet (50 mg total) by mouth daily. 60 tablet 4   sildenafil  (VIAGRA ) 50 MG tablet Take 1 tablet (50 mg total) by mouth daily as needed for erectile dysfunction. 10 tablet 1   spironolactone  (ALDACTONE ) 25 MG tablet Take 0.5 tablets (12.5 mg total) by mouth daily. 90 tablet 1   warfarin (COUMADIN ) 5 MG tablet Take warfarin 1 - 1 1/2 tablets daily or as directed by coumadin  clinic 45 tablet 5   acetaminophen  (TYLENOL ) 325 MG tablet Take 2 tablets (650 mg total) by mouth every 4 (four) hours as needed for headache or mild pain. (Patient not taking: Reported on 08/27/2023)     digoxin  (LANOXIN ) 0.125 MG tablet Take 1 tablet (0.125 mg total) by mouth daily. (Patient not taking: Reported on 01/20/2024) 90 tablet 3   No current facility-administered medications for this encounter.   No Known Allergies  Social History   Socioeconomic History   Marital status: Single    Spouse name: Not on file   Number of children: Not on file   Years of education: Not on file   Highest  education level: Not on file  Occupational History   Not on file  Tobacco Use   Smoking status: Former    Current packs/day: 0.00    Types: Cigarettes    Quit date: 10/14/2020    Years since quitting: 3.2   Smokeless tobacco: Never  Vaping Use   Vaping status: Never Used  Substance and Sexual Activity   Alcohol use: No   Drug use: No   Sexual activity: Yes  Other Topics Concern   Not on file  Social History Narrative   Left handed       Are you currently employed ? no   What is your current occupation?   Do  you live at home alone?no   Who lives with you? son   What type of home do you live in: 1 story or 2 story? one   Caffeine no    Social Drivers of Corporate investment banker Strain: Low Risk  (03/13/2021)   Received from [facility]   Overall Financial Resource Strain (CARDIA)    Difficulty of Paying Living Expenses: Not hard at all  Food Insecurity: No Food Insecurity (04/03/2023)   Hunger Vital Sign    Worried About Running Out of Food in the Last Year: Never true    Ran Out of Food in the Last Year: Never true  Transportation Needs: No Transportation Needs (04/03/2023)   PRAPARE - Administrator, Civil Service (Medical): No    Lack of Transportation (Non-Medical): No  Physical Activity: Insufficiently Active (05/18/2020)   Received from [facility]   Exercise Vital Sign    On average, how many days per week do you engage in moderate to strenuous exercise (like a brisk walk)?: 7 days    On average, how many minutes do you engage in exercise at this level?: 20 min  Stress: No Stress Concern Present (05/18/2020)   Received from [facility] - Occupational Stress Questionnaire    Feeling of Stress : Not at all  Social Connections: Socially Isolated (05/18/2020)   Received from [facility]   Social Connection and Isolation Panel    In a typical week, how many times do you talk on the phone with  family, friends, or neighbors?: More than three times a week    How often do you get together with friends or relatives?: More than three times a week    How often do you attend church or religious services?: Never    Do you belong to any clubs or organizations such as church groups, unions, fraternal or athletic groups, or school groups?: No    How often do you attend meetings of the clubs or organizations you belong to?: Never    Are you married, widowed, divorced, separated, never married, or living with a partner?: Never married  Intimate Partner Violence: Not At Risk (04/03/2023)   Humiliation, Afraid, Rape, and Kick questionnaire    Fear of Current or Ex-Partner: No    Emotionally Abused: No    Physically Abused: No    Sexually Abused: No   Family History  Problem Relation Age of Onset   Multiple sclerosis Mother    Psoriasis Mother    Depression Father    Diabetes Father    Diabetes Paternal Grandmother    BP 120/62   Pulse 88   Ht 5' 9 (1.753 m)   Wt 117.2 kg (258 lb 6.4 oz)   SpO2 96%   BMI 38.16 kg/m   Wt Readings from Last 3 Encounters:  01/20/24 117.2 kg (258 lb 6.4 oz)  08/27/23 113.4 kg (250 lb)  07/23/23 115.8 kg (255 lb 6.4 oz)   PHYSICAL EXAM: General:  NAD. No resp difficulty, walked into clinic HEENT: Normal Neck: Supple. No JVD. Cor: Regular rate & rhythm. No rubs, gallops or murmurs. + mechanical S1 Lungs: Clear Abdomen: Soft, nontender, nondistended.  Extremities: No cyanosis, clubbing, rash, edema Neuro: Alert & oriented x 3, moves all 4 extremities w/o difficulty. Affect pleasant. L forearm paralysis/atrophy. Affect pleasant  ECG (personally reviewed): NSR 95 bpm with PVCs, QTc 565 msec  ReDs reading: 34 %,  normal  ASSESSMENT & PLAN:  1. Chronic HFrEF/Biventricular Failure - Echo (4/23): EF of 20-25% and RV function was severely reduced.  - Echo (7/23): EF 10% RV severely HK. Cardiogenic/septic shock, required NE and milrinone . - TEE  (7/23) showed severe BiV failure + vegetation on mechanical MVR. - Echo (12/23) EF 25-30%, RV severely reduced - CPX 5/24 PFTs with moderate restriction pVO2 14.2 (40%) when adjusted to ibw 17.3 Slope 32 RER 1.10  - Echo 07/23/23 EF 25-30% MVR stable RV moderately decreased - Doing well. NYHA .II volume ok - Restart digoxin  0.125 mg daily. - Has been off Coreg  x months.  - Continue Lasix  80 mg daily + 20 KCL daily - Continue Farxiga  10 mg daily. - Continue spiro 12.5 mg daily - Continue Entresto  24/26 bid  - Overall stable from HF perspective. Continue close f/u. If deteriorates will need transplant eval - Labs today. Will need a digoxin  trough in a couple weeks. - Update echo at follow up   2. 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  - Underwent MVR with mechanical mitral valve in 12/22 at [facility].  - TEE 7/23 severe biventricular failure. + vegetation on mechanical MVR. -> Completed 6 weeks IV abx. No infectious symptoms - Coumadin  Clinic managing INRs. No bleeding - Valve stable on recent echo    3. Paroxysmal Atrial Fibrillation - Does not tolerate AF - NSR on ECG today - Continue amiodarone  200 mg daily - Amio labs today - QTc 565 msec on ECG today, will repeat ECG in a couple weeks to follow.  4. Autoimmune Disorder  - He has seronegative spondylitis and pyoderma gangrenosum. - Following with Derm at [facility] on prednisone  - No longer on secukinumab injections. Awaiting for insurance to approve Stellara   5. 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 Cherrie + echo.  Harlene CHRISTELLA Gainer, FNP Advanced Heart Failure  Team  01/20/24

## 2024-01-19 NOTE — Telephone Encounter (Signed)
 Called to confirm/remind patient of their appointment at the Advanced Heart Failure Clinic on 01/20/24.   Appointment:   [x] Confirmed  [] Left mess   [] No answer/No voice mail  [] VM Full/unable to leave message  [] Phone not in service  Patient reminded to bring all medications and/or complete list.  Confirmed patient has transportation. Gave directions, instructed to utilize valet parking.

## 2024-01-20 ENCOUNTER — Ambulatory Visit (HOSPITAL_COMMUNITY)
Admission: RE | Admit: 2024-01-20 | Discharge: 2024-01-20 | Disposition: A | Discharge status: Home / Self Care | Payer: Medicaid Other | Source: Ambulatory Visit | Attending: Family Medicine | Admitting: Family Medicine

## 2024-01-20 ENCOUNTER — Encounter (HOSPITAL_COMMUNITY): Payer: Self-pay

## 2024-01-20 ENCOUNTER — Ambulatory Visit (HOSPITAL_COMMUNITY): Payer: Self-pay | Admitting: Family Medicine

## 2024-01-20 VITALS — BP 120/62 | HR 88 | Ht 69.0 in | Wt 258.4 lb

## 2024-01-20 DIAGNOSIS — Z8673 Personal history of transient ischemic attack (TIA), and cerebral infarction without residual deficits: Secondary | ICD-10-CM | POA: Diagnosis not present

## 2024-01-20 DIAGNOSIS — Z7901 Long term (current) use of anticoagulants: Secondary | ICD-10-CM | POA: Diagnosis not present

## 2024-01-20 DIAGNOSIS — I5022 Chronic systolic (congestive) heart failure: Secondary | ICD-10-CM | POA: Diagnosis not present

## 2024-01-20 DIAGNOSIS — I4892 Unspecified atrial flutter: Secondary | ICD-10-CM | POA: Diagnosis not present

## 2024-01-20 DIAGNOSIS — I5082 Biventricular heart failure: Secondary | ICD-10-CM | POA: Diagnosis not present

## 2024-01-20 DIAGNOSIS — D8989 Other specified disorders involving the immune mechanism, not elsewhere classified: Secondary | ICD-10-CM | POA: Diagnosis not present

## 2024-01-20 DIAGNOSIS — I69354 Hemiplegia and hemiparesis following cerebral infarction affecting left non-dominant side: Secondary | ICD-10-CM | POA: Diagnosis not present

## 2024-01-20 DIAGNOSIS — Z79899 Other long term (current) drug therapy: Secondary | ICD-10-CM | POA: Insufficient documentation

## 2024-01-20 DIAGNOSIS — Z952 Presence of prosthetic heart valve: Secondary | ICD-10-CM

## 2024-01-20 DIAGNOSIS — Z7982 Long term (current) use of aspirin: Secondary | ICD-10-CM | POA: Insufficient documentation

## 2024-01-20 DIAGNOSIS — M4680 Other specified inflammatory spondylopathies, site unspecified: Secondary | ICD-10-CM | POA: Diagnosis not present

## 2024-01-20 DIAGNOSIS — I110 Hypertensive heart disease with heart failure: Secondary | ICD-10-CM | POA: Diagnosis not present

## 2024-01-20 DIAGNOSIS — L88 Pyoderma gangrenosum: Secondary | ICD-10-CM | POA: Diagnosis not present

## 2024-01-20 DIAGNOSIS — R5383 Other fatigue: Secondary | ICD-10-CM | POA: Diagnosis not present

## 2024-01-20 DIAGNOSIS — Z87891 Personal history of nicotine dependence: Secondary | ICD-10-CM | POA: Insufficient documentation

## 2024-01-20 DIAGNOSIS — I480 Paroxysmal atrial fibrillation: Secondary | ICD-10-CM | POA: Diagnosis not present

## 2024-01-20 LAB — COMPREHENSIVE METABOLIC PANEL WITH GFR
ALT: 12 U/L (ref 0–44)
AST: 23 U/L (ref 15–41)
Albumin: 2.3 g/dL — ABNORMAL LOW (ref 3.5–5.0)
Alkaline Phosphatase: 99 U/L (ref 38–126)
Anion gap: 9 (ref 5–15)
BUN: 14 mg/dL (ref 6–20)
CO2: 25 mmol/L (ref 22–32)
Calcium: 8.7 mg/dL — ABNORMAL LOW (ref 8.9–10.3)
Chloride: 99 mmol/L (ref 98–111)
Creatinine, Ser: 1.24 mg/dL (ref 0.61–1.24)
GFR, Estimated: >60 mL/min (ref >60)
Glucose, Bld: 108 mg/dL — ABNORMAL HIGH (ref 70–99)
Potassium: 3.2 mmol/L — ABNORMAL LOW (ref 3.5–5.1)
Sodium: 133 mmol/L — ABNORMAL LOW (ref 135–145)
Total Bilirubin: 0.5 mg/dL (ref 0.0–1.2)
Total Protein: 9.9 g/dL — ABNORMAL HIGH (ref 6.5–8.1)

## 2024-01-20 LAB — BRAIN NATRIURETIC PEPTIDE: B Natriuretic Peptide: 1080.8 pg/mL — ABNORMAL HIGH (ref 0.0–100.0)

## 2024-01-20 LAB — TSH: TSH: 3.451 u[IU]/mL (ref 0.350–4.500)

## 2024-01-20 MED ORDER — DIGOXIN 125 MCG PO TABS: 0.1250 mg | ORAL_TABLET | Freq: Every day | ORAL | 3 refills | Status: DC

## 2024-01-20 NOTE — Patient Instructions (Addendum)
 Medication Changes:  RESTART DIGOXIN  0.125MG  ONCE DAILY   Lab Work:  Labs done today, your results will be available in MyChart, we will contact you for abnormal readings.  RETURN FOR LABS AS SCHEDULED IN 2 WEEKS---DO NOT TAKE DIGOXIN  THE MORNING OF THE LAB WORK   WE WILL RE CHECK EKG AT YOUR LAB APPOINTMENT IN 2 WEEKS AS WELL   Follow-Up in: 6 MONTHS WITH AN ECHO PLEASE CALL OUR OFFICE AROUND DECEMBER TO GET SCHEDULED FOR YOUR APPOINTMENT. PHONE NUMBER IS [phone] OPTION 2   At the Advanced Heart Failure Clinic, you and your health needs are our priority. We have a designated team specialized in the treatment of Heart Failure. This Care Team includes your primary Heart Failure Specialized Cardiologist (physician), Advanced Practice Providers (APPs- Physician Assistants and Nurse Practitioners), and Pharmacist who all work together to provide you with the care you need, when you need it.   You may see any of the following providers on your designated Care Team at your next follow up:  Dr. Toribio Fuel Dr. Ezra Shuck Dr. Ria Commander Dr. Odis Brownie Greig Mosses, NP Caffie Shed, [place] [facility] [place], [place] Beckey Coe, NP [place] Lee, NP Tinnie Redman, PharmD   Please be sure to bring in all your medications bottles to every appointment.   Need to Contact Us :  If you have any questions or concerns before your next appointment please send us  a message through Vickery or call our office at [phone].    TO LEAVE A MESSAGE FOR THE NURSE SELECT OPTION 2, PLEASE LEAVE A MESSAGE INCLUDING: YOUR NAME DATE OF BIRTH CALL BACK NUMBER REASON FOR CALL**this is important as we prioritize the call backs  YOU WILL RECEIVE A CALL BACK THE SAME DAY AS LONG AS YOU CALL BEFORE 4:00 PM

## 2024-01-20 NOTE — Progress Notes (Signed)
 ReDS Vest / Clip - 01/20/24 1100       ReDS Vest / Clip   Station Marker D    Ruler Value 32    ReDS Value Range Low volume    ReDS Actual Value 34

## 2024-01-22 NOTE — Addendum Note (Signed)
 Encounter addended by: Buell Powell HERO, RN on: 01/22/2024 2:51 PM  Actions taken: Charge Capture section accepted

## 2024-02-03 ENCOUNTER — Ambulatory Visit (HOSPITAL_COMMUNITY)
Admission: RE | Admit: 2024-02-03 | Discharge: 2024-02-03 | Disposition: A | Discharge status: Home / Self Care | Source: Ambulatory Visit | Attending: Cardiology

## 2024-02-03 ENCOUNTER — Ambulatory Visit (HOSPITAL_COMMUNITY)
Admission: RE | Admit: 2024-02-03 | Discharge: 2024-02-03 | Disposition: A | Discharge status: Home / Self Care | Source: Ambulatory Visit | Attending: Cardiology | Admitting: Cardiology

## 2024-02-03 DIAGNOSIS — I4892 Unspecified atrial flutter: Secondary | ICD-10-CM | POA: Insufficient documentation

## 2024-02-03 DIAGNOSIS — I5022 Chronic systolic (congestive) heart failure: Secondary | ICD-10-CM | POA: Diagnosis not present

## 2024-02-03 LAB — DIGOXIN LEVEL: Digoxin Level: <0.4 ng/mL — ABNORMAL LOW (ref 0.8–2.0)

## 2024-02-03 NOTE — Progress Notes (Signed)
 Patient came in today for f/u for EKG. DOROTHA Gainer NP reviewed EKG and ok to  go. Having digoxin  level also drawn

## 2024-02-08 ENCOUNTER — Ambulatory Visit: Attending: Cardiology

## 2024-02-17 NOTE — Progress Notes (Signed)
 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  HPI: Donald [name] is a 38 y.o. 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  To briefly review: 11/27/22: Patient was admitted from 12/27/21 to 01/17/22 for vomiting and diarrhea. He developed shock and was transferred to ICU. He was diagnosed with endocarditis. He then developed left arm weakness from the elbow down to hand on the day he was to be discharged. He had numbness and tingling. He describes a gradual weakness over a couple of days. He also describes shooting pains into his arm.   MRI brain on 01/09/22 showed acute right MCA territory ischemic infarcts, thought to be cardioembolic in etiology. Patient denies any other neurologic symptoms in other extremities, face droop, vision changes, speech problems.   He had an MRI of left humerus on 01/15/22 that showed large heterogeneous intramuscular collection in the anterior compartment of the left upper extremity, measuring up to 7.2 x 5.7 axially and 18.0 cm in craniocaudal extent...suggestive of cellulitis and pyomyositis. This has since resolved after surgery per patient (surgery was 01/2022).   Patient has done PT, last the beginning of 2024. He has seen some improvement but still weak. He has pins like sensation and numbness in the arm. He only gets occasional shooting pains. The pain is worse when he touches the arm and at night. He is not on any nerve pain medications.   Patient is on warfarin and reports taking it daily. He is also on aspirin  81 mg daily per cardiology.   EtOH use: none Restriction diet: No   02/27/23: Patient had EMG and NMUS on 12/23/22. EMG showed evidence of a left  median, ulnar, and radial mononeuropathies perhaps consistent with ischemic monomelic neuropathy. A brachial plexopathy was felt to be less likely given abnormalities were all distal to the elbow. NMUS showed no definite abnormalities but the left median and ulnar nerves traverse the area of old hematoma in left upper arm and are lost to ultrasound in this area.   Per my note from 12/23/22:  I explained the peripheral origin of patient's symptoms and that the nerve injuries appear quite severe. I explained that treatment would include continuing home exercises as there has been very mild improvement. I warned that there is likely to be significant permanent deficits though.   Patient will follow up with me as planned on 02/27/23. I will consider further imaging and neurosurgery consult if warranted if there is no improvement.   Patient had ongoing pain, so gabapentin  was increased to 300 mg BID on 02/11/23. Patient has ongoing left hand swelling that is likely contributing to pain. He finds it difficult to move arm due to the swelling and pain. He was started on antibiotics for due to infection in the area of hematoma as well.  I increased gabapentin  to 600 mg BID on 02/27/23.  08/27/23: He continues to have pain in the left arm. He does not think the gabapentin  is helping much. He is still taking 600 mg BID. He feels like the arm has gotten a little stronger and he feels his arm more. He still has little movement in the left hand.   He is taking Warfarin, not aspirin .  Most recent Assessment  and Plan (08/27/23): This is Donald [name], a 38 y.o. 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 Also has afib Left arm weakness, numbness, tingling - while initially thought to be due to stroke, EMG confirmed suspicion that etiology was peripheral nervous system and consistent with ischemic monomelic neuropathy, likely secondary to the hematoma in the left upper arm. Patient has had  improvement to sensation and mild improvement to strength, but still has significant tingling and pain in LUE. Gabapentin  has not helped.   Plan: -Wean off gabapentin : 300/600 for 2 days, then 300/300 for 2 days, then 300 mg at night for 2 days, then stop -Start Lyrica  after stopping gabapentin : 25 mg BID for 1 week then 50 mg BID thereafter -Patient agreeable to occupational therapy today -Continue warfarin for afib for secondary stroke prevention  Since their last visit: He finished OT. He has home exercised.   Patient stopped Lyrica  and is back to taking gabapentin  because he did not like Lyrica  as much. He is taking gabapentin  600 mg BID. He denies significant pain with this.  Patient mentioned today he noticed his right leg feels funny and feeling off balance. His right leg feels heavy today. He thinks he feels better now.   MEDICATIONS:  Outpatient Encounter Medications as of 03/02/2024  Medication Sig Note   acetaminophen  (TYLENOL ) 325 MG tablet Take 2 tablets (650 mg total) by mouth every 4 (four) hours as needed for headache or mild pain.    amiodarone  (PACERONE ) 200 MG tablet Take 1 tablet by mouth once daily    digoxin  (LANOXIN ) 0.125 MG tablet Take 1 tablet (0.125 mg total) by mouth daily.    FARXIGA  10 MG TABS tablet Take 1 tablet by mouth once daily    furosemide  (LASIX ) 80 MG tablet Take 1 tablet (80 mg total) by mouth daily.    gabapentin  (NEURONTIN ) 300 MG capsule Take 600 mg by mouth 2 (two) times daily.    metformin (FORTAMET) 500 MG (OSM) 24 hr tablet Take 500 mg by mouth daily with breakfast.    pantoprazole  (PROTONIX ) 40 MG tablet Take 1 tablet (40 mg total) by mouth daily.    potassium chloride  SA (KLOR-CON  M) 20 MEQ tablet Take 1 tablet (20 mEq total) by mouth daily.    sacubitril -valsartan  (ENTRESTO ) 24-26 MG Take 1 tablet by mouth 2 (two) times daily.    sertraline  (ZOLOFT ) 50 MG tablet Take 1 tablet (50 mg total) by mouth daily.    sildenafil  (VIAGRA ) 50 MG  tablet Take 1 tablet (50 mg total) by mouth daily as needed for erectile dysfunction.    spironolactone  (ALDACTONE ) 25 MG tablet Take 0.5 tablets (12.5 mg total) by mouth daily.    warfarin (COUMADIN ) 5 MG tablet Take warfarin 1 - 1 1/2 tablets daily or as directed by coumadin  clinic 04/02/2023: On hold until 10.22.24   No facility-administered encounter medications on file as of 03/02/2024.    PAST MEDICAL HISTORY: Past Medical History:  Diagnosis Date   Anemia    Autoimmune disorder (HCC)    pyoderma gangrenosum   CHF (congestive heart failure) (HCC)    Chronic systolic heart failure (HCC)    a. EF 35-40% by echo in 07/2018 b. EF at 45% by repeat echo in 04/2020   DVT (deep venous thrombosis) (HCC)    h/o   Dysrhythmia    Hidradenitis suppurativa    Mitral regurgitation    a. s/p MV repair with resection of ruptured anterior papillary muscle and  reconstruction of papillary chord and placement of annuloplasty ring in 2019. b. 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HCC)    arthritis   Shock, septic and cardiogenic 12/28/2021   Tricuspid regurgitation     PAST SURGICAL HISTORY: Past Surgical History:  Procedure Laterality Date   CARDIAC CATHETERIZATION     HERNIA REPAIR Right 2018   MITRAL VALVE REPAIR  06/07/2018   [facility] Lam[facility]   MITRAL VALVE REPLACEMENT N/A 06/23/2021   MULTIPLE EXTRACTIONS WITH ALVEOLOPLASTY N/A 11/08/2020   Procedure: EXTRACTION OF TEETH NUMBER ONE, FIFTHTEEN, SIXTEEN, SEVENTEEN, EIGHTTEEN AND THRTY WITH ALVEOLOPLASTY OF LOWER LEFT QUADRANT.;  Surgeon: Celena Lum NOVAK, DMD;  Location: [facility] OR;  Service: Dentistry;  Laterality: N/A;   RIGHT/LEFT HEART CATH AND CORONARY ANGIOGRAPHY N/A 09/11/2020   Procedure: RIGHT/LEFT HEART CATH AND CORONARY ANGIOGRAPHY;  Surgeon: Cherrie Toribio SAUNDERS, MD;  Location: [facility] INVASIVE  CV LAB;  Service: Cardiovascular;  Laterality: N/A;   TEE WITHOUT CARDIOVERSION N/A 05/23/2020   Procedure: TRANSESOPHAGEAL ECHOCARDIOGRAM (TEE) WITH PROPOFOL ;  Surgeon: Alvan Dorn FALCON, MD;  Location: [facility] ENDO SUITE;  Service: Endoscopy;  Laterality: N/A;    ALLERGIES: No Known Allergies  FAMILY HISTORY: Family History  Problem Relation Age of Onset   Multiple sclerosis Mother    Psoriasis Mother    Depression Father    Diabetes Father    Diabetes Paternal Grandmother     SOCIAL HISTORY: Social History   Tobacco Use   Smoking status: Former    Current packs/day: 0.00    Types: Cigarettes    Quit date: 10/14/2020    Years since quitting: 3.3   Smokeless tobacco: Never  Vaping Use   Vaping status: Never Used  Substance Use Topics   Alcohol use: No   Drug use: No   Social History   Social History Narrative   Left handed       Are you currently employed ? no   What is your current occupation?   Do you live at home alone?no   Who lives with you? son   What type of home do you live in: 1 story or 2 story? one   Caffeine no     Objective:  Vital Signs:  BP 120/87   Pulse 68   Ht 5' 9 (1.753 m)   Wt 259 lb (117.5 kg)   SpO2 98%   BMI 38.25 kg/m   General: General appearance: Awake and alert. No distress. Cooperative with exam.  HEENT: Atraumatic. Anicteric. Lungs: Non-labored breathing on room air  Heart: Tachycardic, regular Extremities: No edema.   Neurological: Mental Status: Alert. Speech fluent. No pseudobulbar affect Cranial Nerves: CNII: No RAPD. Visual fields intact. CNIII, IV, VI: PERRL. No nystagmus. EOMI. CN V: Facial sensation intact bilaterally to fine touch. CN VII: Facial muscles symmetric and strong. No ptosis at rest. CN VIII: Hears finger rub well bilaterally. CN IX: No hypophonia. CN X: Palate elevates symmetrically. CN XI: Full strength shoulder shrug bilaterally. CN XII: Tongue protrusion full and midline. No atrophy or  fasciculations. No significant dysarthria Motor: Tone is normal except in distal LUE.  Individual muscle group testing (MRC grade out of 5):  Movement     Neck flexion 5    Neck extension 5     Right Left   Shoulder abduction 5 5  Elbow flexion 5 5   Elbow extension 5 5   Finger abduction - FDI 5 0   Finger abduction - ADM 5 0   Finger extension 5 1   Finger distal flexion - 2/3 5 4    Finger distal flexion - 4/5 5 4    Thumb flexion - FPL 5 0   Thumb abduction - APB 5 0    Hip flexion 5- 5   Knee extension 5- 5   Knee flexion 5- 5   Dorsiflexion 5 5   Plantarflexion 5 5    Reflexes:  Right Left  Bicep 2+ 0  Tricep 2+ 2+  BrRad 0 0  Knee 2+ 2+  Ankle 2+ 2+   Sensation: Pinprick: Intact in all extremities Coordination: Intact finger-to- nose-finger bilaterally. Romberg negative. Gait: Narrow based gait.   Lab and Test Review: New results: 01/20/24: TSH wnl CMP significant for Na 133, K 3.2, glucose 108, Cr 1.24, albumin 2.3  INR (01/18/24): 3.7  Previously reviewed results: INR (08/13/23): 2.3   07/23/23: CMP significant for Na 134, K 3.4, Cr 1.33, albumin 2.5 CBC significant for Hb 12.0, MCV 79            Lab Results  Component Value Date    HGBA1C 5.6 01/10/2022             Lab Results  Component Value Date    VITAMINB12 2,046 (H) 07/28/2021             Lab Results  Component Value Date    TSH 3.252 09/11/2022      External labs: Lipid panel (04/10/22): TG 49, Cholesterol 134, LDL 71, HDL 53 (wnl)   Imaging: MRI brain wo contrast (01/09/22):  IMPRESSION: 1. Patchy small volume acute ischemic nonhemorrhagic cortical infarcts involving the subcortical right frontoparietal region. These are likely embolic in nature. 2. Underlying mild chronic microvascular ischemic disease, advanced for age. Small remote right cerebellar infarct. 3. Multiple scattered chronic micro hemorrhages involving the cerebellum and both cerebral hemispheres, nonspecific,  but favored to be secondary to poorly controlled hypertension.   CTA head and neck (01/10/22): IMPRESSION: CT head:   1. Known small right frontoparietal lobe acute infarcts were better appreciated on the brain MRI of 01/09/2022. 2. Background mild chronic small vessel image changes within the cerebral white matter. 3. Redemonstrated small chronic infarcts within the right cerebellar hemisphere. 4. Small right mastoid effusion.   CTA neck:   1. The common carotid, internal carotid and vertebral arteries are patent within the neck without stenosis or significant atherosclerotic disease. 2. Partially imaged nonspecific edema/stranding within the left axilla. Correlate clinically and consider dedicated imaging.   CTA head:   1. No intracranial large vessel occlusion or proximal high-grade arterial stenosis. 2. Cystic-appearing lesions within the left temporal and preauricular soft tissues, measuring up to 1.7 cm. Direct visualization recommended.   MRI left humerus w/wo contrast (01/15/22): IMPRESSION: Large heterogeneous intramuscular collection in the anterior compartment of the left upper extremity, measuring up to 7.2 x 5.7 axially and 18.0 cm in craniocaudal extent. Extensive associated soft tissue edema throughout the left upper extremity extending to the axilla and lower neck, and distally beyond the elbow into the forearm. Marked skin thickening medially. Findings are suggestive of cellulitis and pyomyositis. Intrinsic T1 signal and blooming artifact suggests the presence of a hemorrhagic component, and this collection could also simply be a large hematoma.   No evidence of osteomyelitis or acute fracture.   Echo (06/05/22): 1.  Left ventricular ejection fraction, by estimation, is 25 to 30%. The  left ventricle has severely decreased function. The left ventricle  demonstrates global hypokinesis. The left ventricular internal cavity size  was mildly dilated. Left  ventricular  diastolic parameters are indeterminate.   2. Right ventricular systolic function is severely reduced. The right  ventricular size is normal. Tricuspid regurgitation signal is inadequate  for assessing PA pressure.   3. Left atrial size was mildly dilated.   4. PHT < 130 ms, elevated TVI ratio elevated in the settiong of low LV  stroke volume (there is no pathologic regurgitation seen by color  Doppler), mean gradient of 6 at a heart rate of 68 bpm reflecting, likely,  a degree of patient prosthesis mismatch..   The mitral valve has been repaired/replaced. No evidence of mitral valve  regurgitation. There is a 31 mm [employer] mechanical valve present in the  mitral position. Procedure Date: 06/10/21.   5. The aortic valve is tricuspid. Aortic valve regurgitation is mild.   6. The inferior vena cava is normal in size with greater than 50%  respiratory variability, suggesting right atrial pressure of 3 mmHg.   EMG (12/23/22): NCV & EMG Findings: Extensive electrodiagnostic evaluation of the left upper limb shows: All sensory responses (median, ulnar, radial, medial antebrachial cutaneous (MAC), and lateral antebrachial cutaneous (LAC)) of the left upper limb are absent. Left median (APB) and ulnar (ADM) motor responses are absent. No motor units with active denervation changes are seen on needle examination of the left first dorsal interosseous and pronator teres muscles. Chronic motor axon loss changes without active denervation changes are seen in the left extensor indicis proprius, brachioradialis, flexor digitorum profundus to digits 4,5, and triceps muscles.   Impression: This is a complex, abnormal study. The findings are most consistent with the following: Evidence of left median, ulnar, and radial mononeuropathies, axon loss in type, severe in degree electrically. 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 While the  absent MAC and LAC could be evidence of a diffuse left brachial plexopathy, the normal biceps and deltoid makes #1 above more likely. No electrodiagnostic evidence of a left cervical (C5-C8) motor radiculopathy.   NMUS (12/23/22): Findings: High frequency (4.0-16.0 MHz) B-mode, nonvascular ultrasound of the left upper limb shows: Cross sectional areas (CSA) of the left median (palm to mid upper arm) and left ulnar (wrist to mid upper arm) nerves are within normal limits. Wrist to forearm ratios of left median nerves is within normal limits. There is no subluxation or dislocation of left ulnar nerve from the ulnar groove with maximal elbow flexion. Hyper-echogenicity of most muscles of the left upper limb.    Impression: This neuromuscular ultrasound does not show any definitive abnormalities of the left median or ulnar nerves. However, there this significant hyper-echogenicity of most muscles of the arm (distal more so than proximal) that is consistent with denervation changes. In addition, both the median and ulnar nerves' paths crosses through area of old hematoma as designated by scar in left upper arm. There is no obvious compression, though both nerves are lost to ultrasound in this area  ASSESSMENT: This is BRONTE KROPF, a 38 y.o. 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 Also has afib on Warfarin Left arm weakness, numbness, tingling - while initially thought to be due to stroke, EMG confirmed suspicion that etiology was peripheral nervous system and consistent with ischemic monomelic neuropathy, likely secondary to  the hematoma in the left upper arm. Patient has had improvement to sensation and mild improvement to strength. Lyrica  helped less than gabapentin  for pain so patient went back to gabapentin  and is currently doing well in terms of pain. Right leg heaviness - etiology is unclear. Sudden onset this morning. There is equivocal weakness on examination.  Given his history, the most concerning etiology is stroke. Patient is resistant to going to ED though. Patient thinks he may have just slept on his leg funny. There is no abnormal sensation currently though.  Plan: -Recommended patient go to ED for stroke work up. He would like to monitor as he is not concerned this could be stroke. Promised to go to ED if he has more symptoms. Stroke precautions discussed -Continue Warfarin for afib -Continue gabapentin  600 mg BID -Continue home exercises given by OT  Return to clinic in 1 year  Total time spent reviewing records, interview, history/exam, documentation, and coordination of care on day of encounter:  30 min  Venetia Potters, MD

## 2024-02-24 ENCOUNTER — Ambulatory Visit: Attending: Cardiology | Admitting: *Deleted

## 2024-02-24 DIAGNOSIS — Z5181 Encounter for therapeutic drug level monitoring: Secondary | ICD-10-CM | POA: Diagnosis not present

## 2024-02-24 DIAGNOSIS — I480 Paroxysmal atrial fibrillation: Secondary | ICD-10-CM | POA: Insufficient documentation

## 2024-02-24 DIAGNOSIS — I6349 Cerebral infarction due to embolism of other cerebral artery: Secondary | ICD-10-CM | POA: Insufficient documentation

## 2024-02-24 DIAGNOSIS — Z952 Presence of prosthetic heart valve: Secondary | ICD-10-CM | POA: Insufficient documentation

## 2024-02-24 LAB — POCT INR: INR: 3.4 — AB (ref 2.0–3.0)

## 2024-02-24 NOTE — Patient Instructions (Signed)
 Pt held warfarin last night because he felt it was thin. Decrease warfarin to 1 tablet daily except 1 1/2 tablets on Tuesdays Recheck in 3 wks

## 2024-02-24 NOTE — Progress Notes (Signed)
 INR 3.4  Please see anticoagulation encounter

## 2024-03-02 ENCOUNTER — Encounter: Payer: Self-pay | Admitting: Neurology

## 2024-03-02 ENCOUNTER — Ambulatory Visit: Admitting: Neurology

## 2024-03-02 VITALS — BP 120/87 | HR 68 | Ht 69.0 in | Wt 259.0 lb

## 2024-03-02 DIAGNOSIS — I63411 Cerebral infarction due to embolism of right middle cerebral artery: Secondary | ICD-10-CM | POA: Diagnosis not present

## 2024-03-02 DIAGNOSIS — G128 Other spinal muscular atrophies and related syndromes: Secondary | ICD-10-CM | POA: Diagnosis not present

## 2024-03-02 DIAGNOSIS — R29898 Other symptoms and signs involving the musculoskeletal system: Secondary | ICD-10-CM | POA: Diagnosis not present

## 2024-03-02 NOTE — Patient Instructions (Addendum)
 We discussed the new heaviness in your right leg. I was concerned that this could be stroke and we discussed going to ED to be evaluated. You would rather monitor. You have to go to the ED with any new or worsening symptom though.  If you have new difficulty speaking, face droop, numbness on one side of the body, weakness on one side of the body, or dizziness/imbalance, this could be the sign of a stroke. Don't wait, please call EMS and be evaluated at the nearest emergency room.   -Continue Warfarin for afib -Continue gabapentin  600 mg BID -Continue home exercises given by OT  Follow up in 1 year or sooner if needed.  The physicians and staff at [facility] are committed to providing excellent care. You may receive a survey requesting feedback about your experience at our office. We strive to receive very good responses to the survey questions. If you feel that your experience would prevent you from giving the office a very good  response, please contact our office to try to remedy the situation. We may be reached at [phone]. Thank you for taking the time out of your busy day to complete the survey.  Venetia Potters, MD [facility]

## 2024-03-03 ENCOUNTER — Other Ambulatory Visit: Payer: Self-pay | Admitting: Cardiology

## 2024-03-16 ENCOUNTER — Encounter

## 2024-03-23 ENCOUNTER — Ambulatory Visit: Attending: Cardiology | Admitting: *Deleted

## 2024-03-23 DIAGNOSIS — I6349 Cerebral infarction due to embolism of other cerebral artery: Secondary | ICD-10-CM | POA: Insufficient documentation

## 2024-03-23 DIAGNOSIS — I480 Paroxysmal atrial fibrillation: Secondary | ICD-10-CM | POA: Diagnosis present

## 2024-03-23 DIAGNOSIS — Z5181 Encounter for therapeutic drug level monitoring: Secondary | ICD-10-CM | POA: Insufficient documentation

## 2024-03-23 DIAGNOSIS — Z952 Presence of prosthetic heart valve: Secondary | ICD-10-CM | POA: Diagnosis present

## 2024-03-23 LAB — POCT INR: INR: 4.6 — AB (ref 2.0–3.0)

## 2024-03-23 NOTE — Progress Notes (Signed)
 INR 4.6 Please see anticoagulation encounter

## 2024-03-23 NOTE — Patient Instructions (Signed)
 Hold warfarin tonight, take 1/2 tablet tomorrow night then decrease dose to 1 tablet daily  Recheck in 2 wks

## 2024-04-06 ENCOUNTER — Encounter

## 2024-04-07 ENCOUNTER — Ambulatory Visit: Payer: Self-pay | Admitting: Cardiology

## 2024-04-12 ENCOUNTER — Ambulatory Visit: Attending: Cardiology | Admitting: *Deleted

## 2024-04-12 DIAGNOSIS — Z5181 Encounter for therapeutic drug level monitoring: Secondary | ICD-10-CM | POA: Insufficient documentation

## 2024-04-12 DIAGNOSIS — I480 Paroxysmal atrial fibrillation: Secondary | ICD-10-CM | POA: Diagnosis not present

## 2024-04-12 DIAGNOSIS — Z952 Presence of prosthetic heart valve: Secondary | ICD-10-CM | POA: Insufficient documentation

## 2024-04-12 DIAGNOSIS — I6349 Cerebral infarction due to embolism of other cerebral artery: Secondary | ICD-10-CM | POA: Insufficient documentation

## 2024-04-12 LAB — POCT INR: INR: 5.5 — AB (ref 2.0–3.0)

## 2024-04-12 NOTE — Patient Instructions (Signed)
 Hold warfarin tonight and tomorrow night then decrease dose to 1 tablet daily except 1/2 tablet on Sundays Recheck in 1 wk

## 2024-04-12 NOTE — Progress Notes (Signed)
 INR 5.5; Please see anticoagulation encounter

## 2024-04-18 DIAGNOSIS — F411 Generalized anxiety disorder: Secondary | ICD-10-CM | POA: Diagnosis not present

## 2024-04-18 DIAGNOSIS — F331 Major depressive disorder, recurrent, moderate: Secondary | ICD-10-CM | POA: Diagnosis not present

## 2024-04-18 DIAGNOSIS — F329 Major depressive disorder, single episode, unspecified: Secondary | ICD-10-CM | POA: Diagnosis not present

## 2024-04-20 ENCOUNTER — Ambulatory Visit: Attending: Cardiology | Admitting: *Deleted

## 2024-04-20 DIAGNOSIS — I480 Paroxysmal atrial fibrillation: Secondary | ICD-10-CM | POA: Diagnosis not present

## 2024-04-20 DIAGNOSIS — Z5181 Encounter for therapeutic drug level monitoring: Secondary | ICD-10-CM | POA: Diagnosis not present

## 2024-04-20 DIAGNOSIS — Z952 Presence of prosthetic heart valve: Secondary | ICD-10-CM | POA: Diagnosis not present

## 2024-04-20 DIAGNOSIS — I6349 Cerebral infarction due to embolism of other cerebral artery: Secondary | ICD-10-CM | POA: Diagnosis not present

## 2024-04-20 LAB — POCT INR: INR: 2 (ref 2.0–3.0)

## 2024-04-20 NOTE — Progress Notes (Signed)
 INR 2.0 Please see anticoagulation encounter.

## 2024-04-20 NOTE — Patient Instructions (Signed)
 Take warfarin 1 1/2 tablets tonight then resume 1 tablet daily except 1/2 tablet on Sundays Recheck in 2 wks

## 2024-05-02 DIAGNOSIS — F329 Major depressive disorder, single episode, unspecified: Secondary | ICD-10-CM | POA: Diagnosis not present

## 2024-05-02 DIAGNOSIS — F411 Generalized anxiety disorder: Secondary | ICD-10-CM | POA: Diagnosis not present

## 2024-05-04 ENCOUNTER — Ambulatory Visit: Attending: Cardiology | Admitting: *Deleted

## 2024-05-04 DIAGNOSIS — I480 Paroxysmal atrial fibrillation: Secondary | ICD-10-CM | POA: Diagnosis not present

## 2024-05-04 DIAGNOSIS — Z952 Presence of prosthetic heart valve: Secondary | ICD-10-CM | POA: Diagnosis not present

## 2024-05-04 DIAGNOSIS — Z5181 Encounter for therapeutic drug level monitoring: Secondary | ICD-10-CM | POA: Insufficient documentation

## 2024-05-04 DIAGNOSIS — I6349 Cerebral infarction due to embolism of other cerebral artery: Secondary | ICD-10-CM | POA: Insufficient documentation

## 2024-05-04 LAB — POCT INR: INR: 5.5 — AB (ref 2.0–3.0)

## 2024-05-04 NOTE — Progress Notes (Signed)
 INR 5.5; Please see anticoagulation encounter

## 2024-05-04 NOTE — Patient Instructions (Signed)
Hold warfarin tonight then decrease dose to 1 tablet daily except 1/2 tablet on Sundays and Thursdays Recheck in 2 wks

## 2024-05-15 ENCOUNTER — Other Ambulatory Visit: Payer: Self-pay | Admitting: Neurology

## 2024-05-15 ENCOUNTER — Other Ambulatory Visit: Payer: Self-pay | Admitting: Cardiology

## 2024-05-15 DIAGNOSIS — M79602 Pain in left arm: Secondary | ICD-10-CM

## 2024-05-16 DIAGNOSIS — E611 Iron deficiency: Secondary | ICD-10-CM | POA: Diagnosis not present

## 2024-05-16 DIAGNOSIS — L732 Hidradenitis suppurativa: Secondary | ICD-10-CM | POA: Diagnosis not present

## 2024-05-16 DIAGNOSIS — R197 Diarrhea, unspecified: Secondary | ICD-10-CM | POA: Diagnosis not present

## 2024-05-16 DIAGNOSIS — L88 Pyoderma gangrenosum: Secondary | ICD-10-CM | POA: Diagnosis not present

## 2024-05-16 DIAGNOSIS — R634 Abnormal weight loss: Secondary | ICD-10-CM | POA: Diagnosis not present

## 2024-05-16 NOTE — Telephone Encounter (Signed)
 Refill request for warfarin:  Last INR was 5.5 on 05/04/24 Next INR due 05/18/24 LOV was 01/20/24  Refill approved.

## 2024-05-18 ENCOUNTER — Ambulatory Visit: Attending: Cardiology | Admitting: *Deleted

## 2024-05-18 DIAGNOSIS — Z5181 Encounter for therapeutic drug level monitoring: Secondary | ICD-10-CM | POA: Diagnosis not present

## 2024-05-18 DIAGNOSIS — I480 Paroxysmal atrial fibrillation: Secondary | ICD-10-CM | POA: Insufficient documentation

## 2024-05-18 DIAGNOSIS — Z952 Presence of prosthetic heart valve: Secondary | ICD-10-CM | POA: Insufficient documentation

## 2024-05-18 DIAGNOSIS — I6349 Cerebral infarction due to embolism of other cerebral artery: Secondary | ICD-10-CM | POA: Diagnosis not present

## 2024-05-18 LAB — POCT INR: INR: 7 — AB (ref 2.0–3.0)

## 2024-05-18 MED ORDER — WARFARIN SODIUM 5 MG PO TABS: ORAL_TABLET | ORAL | 5 refills | Status: AC

## 2024-05-18 NOTE — Progress Notes (Signed)
 INR 7.0; Please see anticoagulation encounter

## 2024-05-18 NOTE — Addendum Note (Signed)
 Addended by: ROBYNN OLAM HERO on: 05/18/2024 08:54 AM   Modules accepted: Orders

## 2024-05-18 NOTE — Patient Instructions (Signed)
 Hold warfarin x 3 days then decrease dose to 1/2 tablet daily except 1 tablet on Mondays, Wednesdays and Fridays Recheck in 1 wk Denies S/S of bleeding.  Bleeding and fall precautions discussed with pt and he verbalized understanding.

## 2024-05-20 DIAGNOSIS — F411 Generalized anxiety disorder: Secondary | ICD-10-CM | POA: Diagnosis not present

## 2024-05-20 DIAGNOSIS — F329 Major depressive disorder, single episode, unspecified: Secondary | ICD-10-CM | POA: Diagnosis not present

## 2024-05-25 ENCOUNTER — Emergency Department (HOSPITAL_COMMUNITY)

## 2024-05-25 ENCOUNTER — Ambulatory Visit: Attending: Cardiology

## 2024-05-25 ENCOUNTER — Encounter (HOSPITAL_COMMUNITY): Payer: Self-pay | Admitting: *Deleted

## 2024-05-25 ENCOUNTER — Emergency Department (HOSPITAL_COMMUNITY)
Admission: EM | Admit: 2024-05-25 | Discharge: 2024-05-25 | Disposition: A | Discharge status: Home / Self Care | Attending: Emergency Medicine | Admitting: Emergency Medicine

## 2024-05-25 ENCOUNTER — Other Ambulatory Visit: Payer: Self-pay

## 2024-05-25 DIAGNOSIS — Z7901 Long term (current) use of anticoagulants: Secondary | ICD-10-CM | POA: Insufficient documentation

## 2024-05-25 DIAGNOSIS — D649 Anemia, unspecified: Secondary | ICD-10-CM | POA: Insufficient documentation

## 2024-05-25 DIAGNOSIS — E876 Hypokalemia: Secondary | ICD-10-CM | POA: Diagnosis not present

## 2024-05-25 DIAGNOSIS — R000 Tachycardia, unspecified: Secondary | ICD-10-CM | POA: Diagnosis present

## 2024-05-25 DIAGNOSIS — I517 Cardiomegaly: Secondary | ICD-10-CM | POA: Diagnosis not present

## 2024-05-25 DIAGNOSIS — I959 Hypotension, unspecified: Secondary | ICD-10-CM | POA: Diagnosis not present

## 2024-05-25 DIAGNOSIS — R112 Nausea with vomiting, unspecified: Secondary | ICD-10-CM | POA: Diagnosis not present

## 2024-05-25 DIAGNOSIS — I4891 Unspecified atrial fibrillation: Secondary | ICD-10-CM | POA: Diagnosis not present

## 2024-05-25 DIAGNOSIS — I509 Heart failure, unspecified: Secondary | ICD-10-CM | POA: Diagnosis not present

## 2024-05-25 DIAGNOSIS — I10 Essential (primary) hypertension: Secondary | ICD-10-CM | POA: Diagnosis not present

## 2024-05-25 DIAGNOSIS — I4892 Unspecified atrial flutter: Secondary | ICD-10-CM | POA: Diagnosis not present

## 2024-05-25 DIAGNOSIS — R197 Diarrhea, unspecified: Secondary | ICD-10-CM | POA: Diagnosis not present

## 2024-05-25 DIAGNOSIS — R079 Chest pain, unspecified: Secondary | ICD-10-CM | POA: Diagnosis not present

## 2024-05-25 LAB — BASIC METABOLIC PANEL WITH GFR
Anion gap: 12 (ref 5–15)
Anion gap: 10 (ref 5–15)
BUN: 12 mg/dL (ref 6–20)
BUN: 11 mg/dL (ref 6–20)
CO2: 25 mmol/L (ref 22–32)
CO2: 25 mmol/L (ref 22–32)
Calcium: 8.2 mg/dL — ABNORMAL LOW (ref 8.9–10.3)
Calcium: 7.7 mg/dL — ABNORMAL LOW (ref 8.9–10.3)
Chloride: 100 mmol/L (ref 98–111)
Chloride: 100 mmol/L (ref 98–111)
Creatinine, Ser: 1.17 mg/dL (ref 0.61–1.24)
Creatinine, Ser: 1.07 mg/dL (ref 0.61–1.24)
GFR, Estimated: >60 mL/min (ref >60)
GFR, Estimated: >60 mL/min (ref >60)
Glucose, Bld: 92 mg/dL (ref 70–99)
Glucose, Bld: 113 mg/dL — ABNORMAL HIGH (ref 70–99)
Potassium: 2.8 mmol/L — ABNORMAL LOW (ref 3.5–5.1)
Potassium: 3.6 mmol/L (ref 3.5–5.1)
Sodium: 137 mmol/L (ref 135–145)
Sodium: 135 mmol/L (ref 135–145)

## 2024-05-25 LAB — CBC WITH DIFFERENTIAL/PLATELET
Abs Immature Granulocytes: 0.04 K/uL (ref 0.00–0.07)
Basophils Absolute: 0 K/uL (ref 0.0–0.1)
Basophils Relative: 0 %
Eosinophils Absolute: 0 K/uL (ref 0.0–0.5)
Eosinophils Relative: 0 %
HCT: 30.1 % — ABNORMAL LOW (ref 39.0–52.0)
Hemoglobin: 9.2 g/dL — ABNORMAL LOW (ref 13.0–17.0)
Immature Granulocytes: 0 %
Lymphocytes Relative: 17 %
Lymphs Abs: 1.7 K/uL (ref 0.7–4.0)
MCH: 23.5 pg — ABNORMAL LOW (ref 26.0–34.0)
MCHC: 30.6 g/dL (ref 30.0–36.0)
MCV: 77 fL — ABNORMAL LOW (ref 80.0–100.0)
Monocytes Absolute: 0.3 K/uL (ref 0.1–1.0)
Monocytes Relative: 3 %
Neutro Abs: 7.5 K/uL (ref 1.7–7.7)
Neutrophils Relative %: 80 %
Platelets: 462 K/uL — ABNORMAL HIGH (ref 150–400)
RBC: 3.91 MIL/uL — ABNORMAL LOW (ref 4.22–5.81)
RDW: 18 % — ABNORMAL HIGH (ref 11.5–15.5)
WBC: 9.6 K/uL (ref 4.0–10.5)
nRBC: 0 % (ref 0.0–0.2)

## 2024-05-25 LAB — PROTIME-INR
INR: 3.8 — ABNORMAL HIGH (ref 0.8–1.2)
Prothrombin Time: 39.3 s — ABNORMAL HIGH (ref 11.4–15.2)

## 2024-05-25 LAB — CBG MONITORING, ED: Glucose-Capillary: 97 mg/dL (ref 70–99)

## 2024-05-25 LAB — DIGOXIN LEVEL: Digoxin Level: <0.6 ng/mL — ABNORMAL LOW (ref 0.8–2.0)

## 2024-05-25 LAB — TROPONIN T, HIGH SENSITIVITY
Troponin T High Sensitivity: 29 ng/L — ABNORMAL HIGH (ref 0–19)
Troponin T High Sensitivity: 31 ng/L — ABNORMAL HIGH (ref 0–19)

## 2024-05-25 LAB — APTT: aPTT: 60 s — ABNORMAL HIGH (ref 24–36)

## 2024-05-25 LAB — MAGNESIUM: Magnesium: 1.5 mg/dL — ABNORMAL LOW (ref 1.7–2.4)

## 2024-05-25 MED ORDER — DILTIAZEM HCL 25 MG/5ML IV SOLN
10.0000 mg | Freq: Once | INTRAVENOUS | Status: AC
  Administered 2024-05-25: 10 mg via INTRAVENOUS
  Filled 2024-05-25: qty 5

## 2024-05-25 MED ORDER — MAGNESIUM SULFATE 2 GM/50ML IV SOLN
2.0000 g | Freq: Once | INTRAVENOUS | Status: AC
  Administered 2024-05-25: 2 g via INTRAVENOUS
  Filled 2024-05-25: qty 50

## 2024-05-25 MED ORDER — ACETAMINOPHEN 325 MG PO TABS
650.0000 mg | ORAL_TABLET | Freq: Once | ORAL | Status: AC
  Administered 2024-05-25: 650 mg via ORAL
  Filled 2024-05-25: qty 2

## 2024-05-25 MED ORDER — AMIODARONE HCL 200 MG PO TABS
200.0000 mg | ORAL_TABLET | Freq: Every day | ORAL | Status: DC
  Filled 2024-05-25: qty 1

## 2024-05-25 MED ORDER — SODIUM CHLORIDE 0.9 % IV BOLUS (SEPSIS)
250.0000 mL | Freq: Once | INTRAVENOUS | Status: AC
  Administered 2024-05-25: 250 mL via INTRAVENOUS

## 2024-05-25 MED ORDER — AMIODARONE HCL 200 MG PO TABS: 200.0000 mg | ORAL_TABLET | Freq: Two times a day (BID) | ORAL | 6 refills | Status: DC

## 2024-05-25 MED ORDER — FENTANYL CITRATE (PF) 100 MCG/2ML IJ SOLN
25.0000 ug | Freq: Once | INTRAMUSCULAR | Status: AC
  Administered 2024-05-25: 25 ug via INTRAVENOUS
  Filled 2024-05-25: qty 2

## 2024-05-25 MED ORDER — POTASSIUM CHLORIDE CRYS ER 20 MEQ PO TBCR: 20.0000 meq | EXTENDED_RELEASE_TABLET | Freq: Two times a day (BID) | ORAL | 0 refills | Status: DC

## 2024-05-25 MED ORDER — SODIUM CHLORIDE 0.9 % IV SOLN
1000.0000 mL | INTRAVENOUS | Status: DC
  Administered 2024-05-25: 1000 mL via INTRAVENOUS

## 2024-05-25 MED ORDER — POTASSIUM CHLORIDE 10 MEQ/100ML IV SOLN
10.0000 meq | INTRAVENOUS | Status: AC
  Administered 2024-05-25 (×3): 10 meq via INTRAVENOUS
  Filled 2024-05-25 (×3): qty 100

## 2024-05-25 NOTE — Discharge Instructions (Signed)
 Your potassium level and your magnesium  level today were low.  You have been given these medications here.  Dr. Alvan request that you increase your amiodarone  dose to 1 tablet twice a day for the next 3 weeks, then you can resume to your once a day dosing.  also, take the prescribed potassium twice a day as directed for the next 3 days then discontinue.  Someone from his office will contact you to arrange follow-up appointment.  If you do not hear from them within the next few days please call his office to arrange follow-up appointment.  Return to emergency department for any new or worsening symptoms.

## 2024-05-25 NOTE — ED Triage Notes (Signed)
 Pt BIB EMS from home for c/o chest pain; pt has hx of afib, heart valve replacement and CHF  HR 90-130's BP 129/70 99% on RA

## 2024-05-25 NOTE — ED Provider Notes (Signed)
 [place] EMERGENCY DEPARTMENT AT [facility] Provider Note   CSN: [id number] Arrival date & time: 05/25/24  0840     Patient presents with: Chest Pain   Donald [name] is a 38 y.o. male.    Chest Pain Associated symptoms: nausea, palpitations, shortness of breath and vomiting        Donald [name] is a 38 y.o. male past medical history of CHF, paroxysmal atrial A-fib/flutter, mitral valve replacement, anticoagulated, autoimmune disorder, prior DVT, who presents to the Emergency Department complaining of sudden onset of episode of rapid heart rate this morning.  States that he felt his heart beating fast and could hear it taking.  Episode spontaneously resolved he attempted to get his kids ready for school had a thing another episode of fast heartbeat that was associated with some chest pain.  He has had 3-4 episodes of vomiting with pain as well.  Feels some shortness of breath.  States he has had a recent stomach bug x 4-5 days with repeated nausea vomiting and occasional diarrhea.  Denies any fever chills or cough.  Has not taken any of his morning medications.  States his INR level last week was elevated was off of his Coumadin  for 2 or 3 days but has since started back.  Was scheduled to have his INR level rechecked this morning  Prior to Admission medications   Medication Sig Start Date End Date Taking? Authorizing Provider  acetaminophen  (TYLENOL ) 325 MG tablet Take 2 tablets (650 mg total) by mouth every 4 (four) hours as needed for headache or mild pain. 06/04/21   Lenetta No D, NP  amiodarone  (PACERONE ) 200 MG tablet Take 1 tablet by mouth once daily 10/15/22   Bensimhon, Toribio SAUNDERS, MD  digoxin  (LANOXIN ) 0.125 MG tablet Take 1 tablet (0.125 mg total) by mouth daily. 01/20/24   Glena Harlene HERO, FNP  FARXIGA  10 MG TABS tablet Take 1 tablet by mouth once daily 11/27/22   Bensimhon, Toribio SAUNDERS, MD  furosemide  (LASIX ) 80 MG tablet Take 1 tablet (80 mg total) by  mouth daily. 07/23/23   Bensimhon, Toribio SAUNDERS, MD  gabapentin  (NEURONTIN ) 300 MG capsule Take 2 capsules by mouth twice daily 05/16/24   Leigh Venetia CROME, MD  metformin (FORTAMET) 500 MG (OSM) 24 hr tablet Take 500 mg by mouth daily with breakfast.    [provider]  pantoprazole  (PROTONIX ) 40 MG tablet Take 1 tablet (40 mg total) by mouth daily. 10/16/22   Hayes Beckey CROME, NP  potassium chloride  SA (KLOR-CON  M) 20 MEQ tablet Take 1 tablet (20 mEq total) by mouth daily. 07/28/23   Bensimhon, Toribio SAUNDERS, MD  sacubitril -valsartan  (ENTRESTO ) 24-26 MG Take 1 tablet by mouth 2 (two) times daily. 07/23/23   Bensimhon, Toribio SAUNDERS, MD  sertraline  (ZOLOFT ) 50 MG tablet Take 1 tablet (50 mg total) by mouth daily. 09/11/22   Bensimhon, Toribio SAUNDERS, MD  sildenafil  (VIAGRA ) 50 MG tablet Take 1 tablet (50 mg total) by mouth daily as needed for erectile dysfunction. 09/11/22   Bensimhon, Toribio SAUNDERS, MD  spironolactone  (ALDACTONE ) 25 MG tablet Take 0.5 tablets (12.5 mg total) by mouth daily. 09/11/22   Bensimhon, Daniel R, MD  warfarin (COUMADIN ) 5 MG tablet TAKE 1/2 TO 1  TABLET BY MOUTH ONCE DAILY OR AS DIRECTED BY COUMADIN  CLINIC 05/18/24   Alvan Dorn FALCON, MD    Allergies: Patient has no known allergies.    Review of Systems  Respiratory:  Positive for shortness of breath.  Cardiovascular:  Positive for chest pain and palpitations.  Gastrointestinal:  Positive for nausea and vomiting.  All other systems reviewed and are negative.   Updated Vital Signs BP 102/81 (BP Location: Right Arm)   Pulse (!) 123   Temp 98 F (36.7 C) (Oral)   Resp 19   Ht 5' 9 (1.753 m)   Wt 106.6 kg   SpO2 98%   BMI 34.70 kg/m   Physical Exam Vitals and nursing note reviewed.  Constitutional:      Appearance: He is not toxic-appearing.  HENT:     Mouth/Throat:     Mouth: Mucous membranes are moist.  Eyes:     Conjunctiva/sclera: Conjunctivae normal.     Pupils: Pupils are equal, round, and reactive to light.  Cardiovascular:      Rate and Rhythm: Tachycardia present. Rhythm irregular.     Pulses: Normal pulses.  Pulmonary:     Effort: Pulmonary effort is normal.  Abdominal:     General: There is no distension.     Palpations: Abdomen is soft.     Tenderness: There is no abdominal tenderness.  Skin:    General: Skin is warm.     Capillary Refill: Capillary refill takes less than 2 seconds.  Neurological:     General: No focal deficit present.     Mental Status: He is alert.     Sensory: No sensory deficit.     Motor: No weakness.     (all labs ordered are listed, but only abnormal results are displayed) Labs Reviewed  BASIC METABOLIC PANEL WITH GFR - Abnormal; Notable for the following components:      Result Value   Potassium 2.8 (*)    Calcium  8.2 (*)    All other components within normal limits  MAGNESIUM  - Abnormal; Notable for the following components:   Magnesium  1.5 (*)    All other components within normal limits  CBC WITH DIFFERENTIAL/PLATELET - Abnormal; Notable for the following components:   RBC 3.91 (*)    Hemoglobin 9.2 (*)    HCT 30.1 (*)    MCV 77.0 (*)    MCH 23.5 (*)    RDW 18.0 (*)    Platelets 462 (*)    All other components within normal limits  PROTIME-INR - Abnormal; Notable for the following components:   Prothrombin Time 39.3 (*)    INR 3.8 (*)    All other components within normal limits  APTT - Abnormal; Notable for the following components:   aPTT 60 (*)    All other components within normal limits  DIGOXIN  LEVEL - Abnormal; Notable for the following components:   Digoxin  Level <0.6 (*)    All other components within normal limits  BASIC METABOLIC PANEL WITH GFR - Abnormal; Notable for the following components:   Glucose, Bld 113 (*)    Calcium  7.7 (*)    All other components within normal limits  TROPONIN T, HIGH SENSITIVITY - Abnormal; Notable for the following components:   Troponin T High Sensitivity 29 (*)    All other components within normal limits   TROPONIN T, HIGH SENSITIVITY - Abnormal; Notable for the following components:   Troponin T High Sensitivity 31 (*)    All other components within normal limits  CBG MONITORING, ED    EKG: EKG Interpretation Date/Time:  Wednesday May 25 2024 08:45:04 EST Ventricular Rate:  107 PR Interval:    QRS Duration:  124 QT Interval:  408 QTC Calculation: 545  R Axis:   68  Text Interpretation: Atrial flutter with predominant 2:1 AV block Ventricular premature complex Probable LVH with secondary repol abnrm Prolonged QT interval Confirmed by Cleotilde Rogue ([id number]) on 05/25/2024 8:52:56 AM  Radiology: DG Chest Portable 1 View Result Date: 05/25/2024 CLINICAL DATA:  Chest pain.  Tachycardia.  Atrial fibrillation. EXAM: PORTABLE CHEST 1 VIEW COMPARISON:  04/05/2022 FINDINGS: Stable cardiomegaly. Previous mitral valve replacement. Both lungs are clear. IMPRESSION: Stable cardiomegaly. No active lung disease. Electronically Signed   By: Norleen DELENA Kil M.D.   On: 05/25/2024 10:02     Procedures    CRITICAL CARE Performed by: Yanni Quiroa Total critical care time: 40 minutes Critical care time was exclusive of separately billable procedures and treating other patients. Critical care was necessary to treat or prevent imminent or life-threatening deterioration. Critical care was time spent personally by me on the following activities: development of treatment plan with patient and/or surrogate as well as nursing, discussions with consultants, evaluation of patient's response to treatment, examination of patient, obtaining history from patient or surrogate, ordering and performing treatments and interventions, ordering and review of laboratory studies, ordering and review of radiographic studies, pulse oximetry and re-evaluation of patient's condition.  Medications Ordered in the ED - No data to display                                  Medical Decision Making   Patient here from home with  chest pain tachycardia.  Has history of atrial fibs and flutter along with medical mitral valve replacement in 2022.  Anticoagulated on warfarin.  Had episode of rapid heart rate this morning that spontaneously resolved episode reoccurred and was associated with chest pain.  He also endorses some nausea vomiting and shortness of breath.  States that he has been dealing with a stomach bug for several days with nausea vomiting.  On review of medical records, patient's last echo showed LVEF of February 2025 of 25 to 30%  I suspect that his current symptoms are secondary to his persistent vomiting and possible volume depletion.  He may also have electrolyte abnormalities.  Amount and/or Complexity of Data Reviewed Labs: ordered.    Details: Labs here, no leukocytosis, hemoglobin low but near baseline.  His magnesium  is also low at 1.5.  INR is 3.8 troponin elevated at 29, delta is 31.  Chemistries show potassium is low at 2.8 Radiology: ordered.    Details: Chest x-ray shows stable cardiomegaly, lungs are clear ECG/medicine tests: ordered.    Details: EKG shows atrial fibrillation with a predominant 2-1 AV block..  Ventricular premature complex with probable LVH Discussion of management or test interpretation with external provider(s):  Patient's initial presentation showed mild hypotension with A-fib flutter and RVR.  He was given 2 mg of diltiazem  here with significant improvement of his symptoms and rate improved now in the 70s to 80s.  His blood pressure is improving after the IV fluids.   I Spoke with Dr. Alvan with cardiology discussed findings.  He recommends gentle rehydration with small saline bolus and replenish electrolytes and reassess.   On recheck, patient reports feeling better, no further vomiting.  He is tolerating crackers and oral fluids.  His current blood pressure at 1415 is :  110/87 heart rate 78 respirations 18 and his O2 sat is 100% on room air.  After his workup here, I  reach back out to Dr. Alvan  and he recommended to increase his amiodarone  to 200 mg twice a day for 3 weeks then he can return to his once a day dosing.  He also recommended potassium for 3 days.  Patient is agreeable to this plan.  Dr. Ranae office to reach out to the patient to arrange a clinic follow-up.  Patient agreeable to plan.     Risk OTC drugs. Prescription drug management.        Final diagnoses:  Atrial flutter with rapid ventricular response (HCC)  Nausea and vomiting, unspecified vomiting type  Hypokalemia    ED Discharge Orders     None          Herlinda Milling, PA-C 05/25/24 1811    Cleotilde Rogue, MD 05/26/24 [phone]

## 2024-05-26 ENCOUNTER — Inpatient Hospital Stay (HOSPITAL_COMMUNITY)
Admission: EM | Admit: 2024-05-26 | Discharge: 2024-05-31 | DRG: 292 | Disposition: A | Attending: Internal Medicine | Admitting: Internal Medicine

## 2024-05-26 ENCOUNTER — Other Ambulatory Visit: Payer: Self-pay

## 2024-05-26 ENCOUNTER — Encounter (HOSPITAL_COMMUNITY): Payer: Self-pay | Admitting: Emergency Medicine

## 2024-05-26 ENCOUNTER — Emergency Department (HOSPITAL_COMMUNITY)

## 2024-05-26 DIAGNOSIS — R0989 Other specified symptoms and signs involving the circulatory and respiratory systems: Secondary | ICD-10-CM | POA: Diagnosis not present

## 2024-05-26 DIAGNOSIS — R55 Syncope and collapse: Secondary | ICD-10-CM

## 2024-05-26 DIAGNOSIS — R079 Chest pain, unspecified: Secondary | ICD-10-CM

## 2024-05-26 DIAGNOSIS — R002 Palpitations: Principal | ICD-10-CM

## 2024-05-26 DIAGNOSIS — E876 Hypokalemia: Secondary | ICD-10-CM

## 2024-05-26 DIAGNOSIS — I517 Cardiomegaly: Secondary | ICD-10-CM | POA: Diagnosis not present

## 2024-05-26 LAB — CBC
HCT: 29.9 % — ABNORMAL LOW (ref 39.0–52.0)
Hemoglobin: 9.4 g/dL — ABNORMAL LOW (ref 13.0–17.0)
MCH: 24 pg — ABNORMAL LOW (ref 26.0–34.0)
MCHC: 31.4 g/dL (ref 30.0–36.0)
MCV: 76.3 fL — ABNORMAL LOW (ref 80.0–100.0)
Platelets: 459 K/uL — ABNORMAL HIGH (ref 150–400)
RBC: 3.92 MIL/uL — ABNORMAL LOW (ref 4.22–5.81)
RDW: 18.3 % — ABNORMAL HIGH (ref 11.5–15.5)
WBC: 10.4 K/uL (ref 4.0–10.5)
nRBC: 0 % (ref 0.0–0.2)

## 2024-05-26 LAB — TROPONIN I (HIGH SENSITIVITY)
Troponin I (High Sensitivity): 27 ng/L — ABNORMAL HIGH (ref <18)
Troponin I (High Sensitivity): 26 ng/L — ABNORMAL HIGH (ref <18)

## 2024-05-26 LAB — BASIC METABOLIC PANEL WITH GFR
Anion gap: 9 (ref 5–15)
BUN: 6 mg/dL (ref 6–20)
CO2: 25 mmol/L (ref 22–32)
Calcium: 8.1 mg/dL — ABNORMAL LOW (ref 8.9–10.3)
Chloride: 97 mmol/L — ABNORMAL LOW (ref 98–111)
Creatinine, Ser: 1.13 mg/dL (ref 0.61–1.24)
GFR, Estimated: >60 mL/min (ref >60)
Glucose, Bld: 127 mg/dL — ABNORMAL HIGH (ref 70–99)
Potassium: 3.1 mmol/L — ABNORMAL LOW (ref 3.5–5.1)
Sodium: 131 mmol/L — ABNORMAL LOW (ref 135–145)

## 2024-05-26 LAB — MAGNESIUM: Magnesium: 1.7 mg/dL (ref 1.7–2.4)

## 2024-05-26 MED ORDER — POTASSIUM CHLORIDE 10 MEQ/100ML IV SOLN
10.0000 meq | INTRAVENOUS | Status: AC
  Administered 2024-05-26 – 2024-05-27 (×2): 10 meq via INTRAVENOUS
  Filled 2024-05-26 (×2): qty 100

## 2024-05-26 MED ORDER — SODIUM CHLORIDE 0.9 % IV BOLUS: 500.0000 mL | Freq: Once | INTRAVENOUS | Status: DC

## 2024-05-26 NOTE — ED Provider Notes (Addendum)
 " [place] EMERGENCY DEPARTMENT AT [facility] Provider Note   CSN: [id number] Arrival date & time: 05/26/24  8062     Patient presents with: Chest Pain   Donald [name] is a 38 y.o. male.   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  The patient states that he has been feeling unwell now for the past few weeks.  He went to [facility] emergency department yesterday.  He was having some issues with atrial fibrillation with RVR yesterday and converted to a sinus rhythm with rate control medication.  He states that despite this he is felt unwell when he has been home today.  He states that he is having episodes of palpitations and lightheadedness still.  He states that he did vomit multiple times today before coming in.  Denies any abdominal pain.  He has been having normal bowel movements.  He came to the ER today for further evaluation regarding this.   Chest Pain Associated symptoms: nausea, palpitations and vomiting        Prior to Admission medications  Medication Sig Start Date End Date Taking? Authorizing Provider  amiodarone  (PACERONE ) 200 MG tablet Take 1 tablet (200 mg total) by mouth 2 (two) times daily. 05/25/24   Triplett, Tammy, PA-C  digoxin  (LANOXIN ) 0.125 MG tablet Take 1 tablet (0.125 mg total) by mouth daily. 01/20/24   [place], Harlene HERO, FNP  furosemide  (LASIX ) 80 MG tablet Take 1 tablet (80 mg total) by mouth daily. 07/23/23   Bensimhon, Toribio SAUNDERS, MD  gabapentin  (NEURONTIN ) 300 MG capsule Take 2 capsules by mouth twice daily 05/16/24   Leigh Venetia CROME, MD  metFORMIN (GLUCOPHAGE) 500 MG tablet Take 500 mg by mouth daily.    [provider]  pantoprazole  (PROTONIX ) 40 MG tablet Take 1 tablet (40 mg total) by  mouth daily. 10/16/22   Hayes Beckey CROME, NP  potassium chloride  SA (KLOR-CON  M) 20 MEQ tablet Take 1 tablet (20 mEq total) by mouth daily. 07/28/23   Bensimhon, Toribio SAUNDERS, MD  potassium chloride  SA (KLOR-CON  M) 20 MEQ tablet Take 1 tablet (20 mEq total) by mouth 2 (two) times daily. 05/25/24   Triplett, Tammy, PA-C  predniSONE  (DELTASONE ) 10 MG tablet Take 10 mg by mouth daily.    [provider]  sacubitril -valsartan  (ENTRESTO ) 24-26 MG Take 1 tablet by mouth 2 (two) times daily. 07/23/23   Bensimhon, Toribio SAUNDERS, MD  sertraline  (ZOLOFT ) 50 MG tablet Take 1 tablet (50 mg total) by mouth daily. 09/11/22   Bensimhon, Toribio SAUNDERS, MD  sildenafil  (VIAGRA ) 50 MG tablet Take 1 tablet (50 mg total) by mouth daily as needed for erectile dysfunction. 09/11/22   Bensimhon, Toribio SAUNDERS, MD  spironolactone  (ALDACTONE ) 25 MG tablet Take 0.5 tablets (12.5 mg total) by mouth daily. 09/11/22   Bensimhon, Toribio SAUNDERS, MD  ustekinumab (STELARA) 45 MG/0.5ML injection Inject 45 mg into the skin. Inject 45 mg (0.5 ml) subcutaneously at week 0, 4 and every 12 weeks thereafter. 04/20/23   [provider]  warfarin (COUMADIN ) 5 MG tablet TAKE 1/2 TO 1  TABLET BY MOUTH ONCE DAILY OR AS DIRECTED BY COUMADIN  CLINIC Patient taking differently: Take 5 mg by mouth See admin instructions. TAKE 1/2 TO 1  TABLET BY MOUTH ONCE DAILY  OR AS DIRECTED BY COUMADIN  CLINIC 05/18/24   Alvan Dorn FALCON, MD    Allergies: Patient has no known allergies.    Review of Systems  Cardiovascular:  Positive for chest pain and palpitations.  Gastrointestinal:  Positive for nausea and vomiting.  Neurological:  Positive for light-headedness.  All other systems reviewed and are negative.   Updated Vital Signs BP (!) 124/91   Pulse 80   Temp 98.2 F (36.8 C)   Resp (!) 22   SpO2 100%   Physical Exam Vitals and nursing note reviewed.   Gen: NAD Eyes: PERRL, EOMI HEENT: no oropharyngeal swelling Neck: trachea midline Resp: clear to  auscultation bilaterally Card: RRR, no murmurs, rubs, or gallops Abd: nontender, nondistended Extremities: no calf tenderness, no edema Vascular: 2+ radial pulses bilaterally, 2+ DP pulses bilaterally Skin: no rashes Psyc: acting appropriately   (all labs ordered are listed, but only abnormal results are displayed) Labs Reviewed  BASIC METABOLIC PANEL WITH GFR - Abnormal; Notable for the following components:      Result Value   Sodium 131 (*)    Potassium 3.1 (*)    Chloride 97 (*)    Glucose, Bld 127 (*)    Calcium  8.1 (*)    All other components within normal limits  CBC - Abnormal; Notable for the following components:   RBC 3.92 (*)    Hemoglobin 9.4 (*)    HCT 29.9 (*)    MCV 76.3 (*)    MCH 24.0 (*)    RDW 18.3 (*)    Platelets 459 (*)    All other components within normal limits  TROPONIN I (HIGH SENSITIVITY) - Abnormal; Notable for the following components:   Troponin I (High Sensitivity) 27 (*)    All other components within normal limits  TROPONIN I (HIGH SENSITIVITY) - Abnormal; Notable for the following components:   Troponin I (High Sensitivity) 26 (*)    All other components within normal limits  CULTURE, BLOOD (ROUTINE X 2)  CULTURE, BLOOD (ROUTINE X 2)  MAGNESIUM   PROTIME-INR  HEPATIC FUNCTION PANEL    EKG: None  Radiology: DG Chest 2 View Result Date: 05/26/2024 CLINICAL DATA:  Chest pain. EXAM: CHEST - 2 VIEW COMPARISON:  Chest radiograph dated 05/25/2024. FINDINGS: Cardiomegaly with vascular congestion. No focal consolidation, pleural effusion or pneumothorax. Median sternotomy wires and mechanical cardiac valve. No acute osseous pathology. IMPRESSION: Cardiomegaly with vascular congestion. No focal consolidation. Electronically Signed   By: Vanetta Chou M.D.   On: 05/26/2024 20:45   DG Chest Portable 1 View Result Date: 05/25/2024 CLINICAL DATA:  Chest pain.  Tachycardia.  Atrial fibrillation. EXAM: PORTABLE CHEST 1 VIEW COMPARISON:   04/05/2022 FINDINGS: Stable cardiomegaly. Previous mitral valve replacement. Both lungs are clear. IMPRESSION: Stable cardiomegaly. No active lung disease. Electronically Signed   By: Norleen DELENA Kil M.D.   On: 05/25/2024 10:02     Procedures   Medications Ordered in the ED  potassium chloride  10 mEq in 100 mL IVPB (10 mEq Intravenous New Bag/Given 05/26/24 2250)                                    Medical Decision Making 38 year old male with past medical history of endocarditis and CHF with ejection fraction of 20 to 25% presenting to the emergency department today with a second time in 2 days with palpitations and lightheadedness as well as some chest discomfort.  The patient's pulse ox is 100% on room air and he is not tachycardic.  Will further evaluate him here with repeat EKG and troponins and keep him on the cardiac monitor to evaluate for arrhythmias.  He appears to be in a sinus rhythm here on the monitor.  He is on anticoagulation with Coumadin  so suspicion for pulmonary embolism is relatively low at this time.  With his ejection fraction of 20 to 25% the certainly does put him at risk for arrhythmias.  Initially fluids were ordered but the patient's x-ray does show some vascular congestion so these were discontinued.  He does have prolonged QT interval on his EKG.  Given the symptoms a call is placed to hospitalist service for admission for further telemetry and observation.  The patient's EKG interpreted by me shows a sinus tachycardia with LVH and nonspecific ST-T changes, the patient does have prolonged QTc  The patient's INR is found to be elevated.  He will need his Coumadin  held but with no active bleeding does not need reversal at this time.  Amount and/or Complexity of Data Reviewed Labs: ordered. Radiology: ordered.  Risk Prescription drug management. Decision regarding hospitalization.        Final diagnoses:  Palpitations  Near syncope  Chest pain, unspecified  type  Hypokalemia    ED Discharge Orders     None          Ula Prentice SAUNDERS, MD 05/26/24 2359    Ula Prentice SAUNDERS, MD 05/27/24 0015    Ula Prentice SAUNDERS, MD 07/01/24 0800  "

## 2024-05-26 NOTE — ED Provider Triage Note (Signed)
 Emergency Medicine Provider Triage Evaluation Note  Donald [name] , a 38 y.o. male  was evaluated in triage.  Pt complains of chest pain, pressure, constant. History of atrial arrhythmias, MV replacement, anticoagulated, autoimmune disease. Seen yesterday at [facility], per cardiology patient sent home for outpatient recheck. Here today with ongoing chest pain.  Review of Systems  Positive: Chest pain, vomiting  Negative: Fever,   Physical Exam  BP 125/77 (BP Location: Right Arm)   Pulse (!) 57   Temp 98.2 F (36.8 C)   Resp (!) 22   SpO2 100%  Gen:   Awake, no distress   Resp:  Normal effort  MSK:   Moves extremities without difficulty  Other:  tachycardic  Medical Decision Making  Medically screening exam initiated at 8:02 PM.  Appropriate orders placed.  Miro R Waltner was informed that the remainder of the evaluation will be completed by another provider, this initial triage assessment does not replace that evaluation, and the importance of remaining in the ED until their evaluation is complete.     Odell Balls, PA-C 05/26/24 2004

## 2024-05-26 NOTE — ED Triage Notes (Signed)
 Pt seen here yesterday for same symptoms- chest pain, vomiting, nausea, diarrhea, sob. States still having symptoms. Appears to be in no distress.

## 2024-05-26 NOTE — ED Triage Notes (Signed)
 Pt c/o persistent cp, nausea and vomiting, pt states he was seen for same yesterday, but he is not getting any better.

## 2024-05-27 ENCOUNTER — Inpatient Hospital Stay (HOSPITAL_COMMUNITY)

## 2024-05-27 DIAGNOSIS — N179 Acute kidney failure, unspecified: Secondary | ICD-10-CM | POA: Diagnosis not present

## 2024-05-27 DIAGNOSIS — Z820 Family history of epilepsy and other diseases of the nervous system: Secondary | ICD-10-CM | POA: Diagnosis not present

## 2024-05-27 DIAGNOSIS — G8929 Other chronic pain: Secondary | ICD-10-CM | POA: Diagnosis not present

## 2024-05-27 DIAGNOSIS — R112 Nausea with vomiting, unspecified: Secondary | ICD-10-CM | POA: Diagnosis not present

## 2024-05-27 DIAGNOSIS — R079 Chest pain, unspecified: Secondary | ICD-10-CM | POA: Diagnosis not present

## 2024-05-27 DIAGNOSIS — I5023 Acute on chronic systolic (congestive) heart failure: Secondary | ICD-10-CM | POA: Diagnosis not present

## 2024-05-27 DIAGNOSIS — E876 Hypokalemia: Secondary | ICD-10-CM | POA: Diagnosis not present

## 2024-05-27 DIAGNOSIS — Z87891 Personal history of nicotine dependence: Secondary | ICD-10-CM | POA: Diagnosis not present

## 2024-05-27 DIAGNOSIS — I480 Paroxysmal atrial fibrillation: Secondary | ICD-10-CM | POA: Diagnosis not present

## 2024-05-27 DIAGNOSIS — F32A Depression, unspecified: Secondary | ICD-10-CM | POA: Diagnosis not present

## 2024-05-27 DIAGNOSIS — I517 Cardiomegaly: Secondary | ICD-10-CM | POA: Diagnosis not present

## 2024-05-27 DIAGNOSIS — L732 Hidradenitis suppurativa: Secondary | ICD-10-CM | POA: Diagnosis not present

## 2024-05-27 DIAGNOSIS — I2510 Atherosclerotic heart disease of native coronary artery without angina pectoris: Secondary | ICD-10-CM | POA: Diagnosis not present

## 2024-05-27 DIAGNOSIS — I4892 Unspecified atrial flutter: Secondary | ICD-10-CM | POA: Diagnosis not present

## 2024-05-27 DIAGNOSIS — L88 Pyoderma gangrenosum: Secondary | ICD-10-CM | POA: Diagnosis not present

## 2024-05-27 DIAGNOSIS — I252 Old myocardial infarction: Secondary | ICD-10-CM | POA: Diagnosis not present

## 2024-05-27 DIAGNOSIS — Z6834 Body mass index (BMI) 34.0-34.9, adult: Secondary | ICD-10-CM | POA: Diagnosis not present

## 2024-05-27 DIAGNOSIS — K219 Gastro-esophageal reflux disease without esophagitis: Secondary | ICD-10-CM | POA: Diagnosis not present

## 2024-05-27 DIAGNOSIS — R002 Palpitations: Secondary | ICD-10-CM | POA: Diagnosis not present

## 2024-05-27 DIAGNOSIS — Z952 Presence of prosthetic heart valve: Secondary | ICD-10-CM | POA: Diagnosis not present

## 2024-05-27 DIAGNOSIS — Z86718 Personal history of other venous thrombosis and embolism: Secondary | ICD-10-CM | POA: Diagnosis not present

## 2024-05-27 DIAGNOSIS — E871 Hypo-osmolality and hyponatremia: Secondary | ICD-10-CM | POA: Diagnosis not present

## 2024-05-27 DIAGNOSIS — M1990 Unspecified osteoarthritis, unspecified site: Secondary | ICD-10-CM | POA: Diagnosis not present

## 2024-05-27 DIAGNOSIS — Z833 Family history of diabetes mellitus: Secondary | ICD-10-CM | POA: Diagnosis not present

## 2024-05-27 DIAGNOSIS — Z7984 Long term (current) use of oral hypoglycemic drugs: Secondary | ICD-10-CM | POA: Diagnosis not present

## 2024-05-27 DIAGNOSIS — Z7901 Long term (current) use of anticoagulants: Secondary | ICD-10-CM | POA: Diagnosis not present

## 2024-05-27 DIAGNOSIS — E66811 Obesity, class 1: Secondary | ICD-10-CM | POA: Diagnosis not present

## 2024-05-27 DIAGNOSIS — Z79899 Other long term (current) drug therapy: Secondary | ICD-10-CM | POA: Diagnosis not present

## 2024-05-27 DIAGNOSIS — R0989 Other specified symptoms and signs involving the circulatory and respiratory systems: Secondary | ICD-10-CM | POA: Diagnosis not present

## 2024-05-27 DIAGNOSIS — I2583 Coronary atherosclerosis due to lipid rich plaque: Secondary | ICD-10-CM | POA: Diagnosis not present

## 2024-05-27 DIAGNOSIS — I509 Heart failure, unspecified: Secondary | ICD-10-CM | POA: Insufficient documentation

## 2024-05-27 LAB — PROTIME-INR
INR: 4.2 (ref 0.8–1.2)
Prothrombin Time: 42.4 s — ABNORMAL HIGH (ref 11.4–15.2)

## 2024-05-27 LAB — BASIC METABOLIC PANEL WITH GFR
Anion gap: 11 (ref 5–15)
BUN: 5 mg/dL — ABNORMAL LOW (ref 6–20)
CO2: 21 mmol/L — ABNORMAL LOW (ref 22–32)
Calcium: 7.7 mg/dL — ABNORMAL LOW (ref 8.9–10.3)
Chloride: 102 mmol/L (ref 98–111)
Creatinine, Ser: 1.06 mg/dL (ref 0.61–1.24)
GFR, Estimated: >60 mL/min (ref >60)
Glucose, Bld: 83 mg/dL (ref 70–99)
Potassium: 4 mmol/L (ref 3.5–5.1)
Sodium: 134 mmol/L — ABNORMAL LOW (ref 135–145)

## 2024-05-27 LAB — HEPATIC FUNCTION PANEL
ALT: 9 U/L (ref 0–44)
AST: 20 U/L (ref 15–41)
Albumin: 1.9 g/dL — ABNORMAL LOW (ref 3.5–5.0)
Alkaline Phosphatase: 77 U/L (ref 38–126)
Bilirubin, Direct: 0.3 mg/dL — ABNORMAL HIGH (ref 0.0–0.2)
Indirect Bilirubin: 0.8 mg/dL (ref 0.3–0.9)
Total Bilirubin: 1.1 mg/dL (ref 0.0–1.2)
Total Protein: 7.8 g/dL (ref 6.5–8.1)

## 2024-05-27 LAB — ECHOCARDIOGRAM COMPLETE
S' Lateral: 5.8 cm
Single Plane A4C EF: 23.5 %

## 2024-05-27 MED ORDER — EMPAGLIFLOZIN 10 MG PO TABS
10.0000 mg | ORAL_TABLET | Freq: Every day | ORAL | Status: DC
  Administered 2024-05-27 – 2024-05-31 (×5): 10 mg via ORAL
  Filled 2024-05-27 (×5): qty 1

## 2024-05-27 MED ORDER — GABAPENTIN 300 MG PO CAPS
600.0000 mg | ORAL_CAPSULE | Freq: Two times a day (BID) | ORAL | Status: DC
  Administered 2024-05-27 – 2024-05-31 (×9): 600 mg via ORAL
  Filled 2024-05-27 (×10): qty 2

## 2024-05-27 MED ORDER — SERTRALINE HCL 50 MG PO TABS
50.0000 mg | ORAL_TABLET | Freq: Every day | ORAL | Status: DC
  Administered 2024-05-28: 50 mg via ORAL
  Filled 2024-05-27 (×5): qty 1

## 2024-05-27 MED ORDER — POTASSIUM CHLORIDE 20 MEQ PO PACK
60.0000 meq | PACK | Freq: Once | ORAL | Status: AC
  Administered 2024-05-27: 60 meq via ORAL
  Filled 2024-05-27: qty 3

## 2024-05-27 MED ORDER — OXYCODONE HCL 5 MG PO TABS
5.0000 mg | ORAL_TABLET | ORAL | Status: DC | PRN
  Administered 2024-05-27 – 2024-05-31 (×10): 5 mg via ORAL
  Filled 2024-05-27 (×10): qty 1

## 2024-05-27 MED ORDER — METOCLOPRAMIDE HCL 5 MG/ML IJ SOLN
5.0000 mg | Freq: Three times a day (TID) | INTRAMUSCULAR | Status: DC
  Administered 2024-05-27 – 2024-05-28 (×3): 5 mg via INTRAVENOUS
  Filled 2024-05-27 (×3): qty 2

## 2024-05-27 MED ORDER — AMIODARONE HCL 200 MG PO TABS
200.0000 mg | ORAL_TABLET | Freq: Two times a day (BID) | ORAL | Status: DC
  Administered 2024-05-27 – 2024-05-28 (×5): 200 mg via ORAL
  Filled 2024-05-27 (×5): qty 1

## 2024-05-27 MED ORDER — WARFARIN SODIUM 5 MG PO TABS: 5.0000 mg | ORAL_TABLET | ORAL | Status: DC

## 2024-05-27 MED ORDER — WARFARIN - PHARMACIST DOSING INPATIENT: Freq: Every day | Status: DC

## 2024-05-27 MED ORDER — MAGNESIUM SULFATE 2 GM/50ML IV SOLN
2.0000 g | Freq: Once | INTRAVENOUS | Status: AC
  Administered 2024-05-27: 2 g via INTRAVENOUS
  Filled 2024-05-27: qty 50

## 2024-05-27 MED ORDER — ACETAMINOPHEN 325 MG PO TABS: 650.0000 mg | ORAL_TABLET | Freq: Four times a day (QID) | ORAL | Status: DC | PRN

## 2024-05-27 MED ORDER — SACUBITRIL-VALSARTAN 24-26 MG PO TABS
1.0000 | ORAL_TABLET | Freq: Two times a day (BID) | ORAL | Status: DC
  Administered 2024-05-27 (×2): 1 via ORAL
  Filled 2024-05-27 (×3): qty 1

## 2024-05-27 MED ORDER — FUROSEMIDE 10 MG/ML IJ SOLN
60.0000 mg | Freq: Two times a day (BID) | INTRAMUSCULAR | Status: DC
  Administered 2024-05-27: 60 mg via INTRAVENOUS
  Filled 2024-05-27 (×2): qty 6

## 2024-05-27 MED ORDER — DIGOXIN 125 MCG PO TABS
0.1250 mg | ORAL_TABLET | Freq: Every day | ORAL | Status: DC
  Administered 2024-05-27 – 2024-05-31 (×5): 0.125 mg via ORAL
  Filled 2024-05-27 (×5): qty 1

## 2024-05-27 MED ORDER — PANTOPRAZOLE SODIUM 40 MG PO TBEC
40.0000 mg | DELAYED_RELEASE_TABLET | Freq: Every day | ORAL | Status: DC
  Administered 2024-05-27 – 2024-05-31 (×5): 40 mg via ORAL
  Filled 2024-05-27 (×5): qty 1

## 2024-05-27 MED ORDER — SPIRONOLACTONE 12.5 MG HALF TABLET
12.5000 mg | ORAL_TABLET | Freq: Every day | ORAL | Status: DC
  Administered 2024-05-27: 12.5 mg via ORAL
  Filled 2024-05-27: qty 1

## 2024-05-27 MED ORDER — PERFLUTREN LIPID MICROSPHERE
1.0000 mL | INTRAVENOUS | Status: AC | PRN
  Administered 2024-05-27: 2 mL via INTRAVENOUS

## 2024-05-27 NOTE — ED Notes (Signed)
 Called and made floor aware of PT arrival

## 2024-05-27 NOTE — Assessment & Plan Note (Signed)
 Elevated high sensitive troponin due to heart failure exacerbation.  Has chronic infero lateral q waves, acute coronary syndrome has been ruled out.

## 2024-05-27 NOTE — Assessment & Plan Note (Addendum)
 Patient on sinus rhythm, with PAC Plan to continue amiodarone  at home dose of 200 mg po daily.  Continue digoxin .   12/1 had 1 mg Vitamin K for supra therapeutic INR. On 12/15 INR down to 1,8, started on enoxaparin  for anticoagulation bridge.  On the day of discharge his INR is 2,0, plan to continue warfarin and enoxaparin  bridge until follow up INR therapeutic.

## 2024-05-27 NOTE — Assessment & Plan Note (Addendum)
 Symptoms have resolved, continue with pre meal metoclopramide  po.  Antiacid therapy with pantoprazole .

## 2024-05-27 NOTE — Progress Notes (Signed)
 Patient was awakened for administration of scheduled nighttime medications. Vital signs were assessed and blood pressure noted to be low. Rechecked BP manually, 60/40. Patient denies any dizziness, or lightheadedness, denies any symptoms. Patient is alert and oriented X4. Notified Dr. Alfornia, holding off lasix , entresto  and gabapentin . MD ordering for labs.    05/27/24 2214  Assess: MEWS Score  Temp 98.1 F (36.7 C)  BP (!) 77/67  MAP (mmHg) 72  Pulse Rate (!) 38  ECG Heart Rate (!) 106  Resp (!) 26  Level of Consciousness Alert  SpO2 98 %  O2 Device Room Air  Assess: MEWS Score  MEWS Temp 0  MEWS Systolic 2  MEWS Pulse 1  MEWS RR 2  MEWS LOC 0  MEWS Score 5  MEWS Score Color Red  Assess: if the MEWS score is Yellow or Red  Were vital signs accurate and taken at a resting state? Yes  Does the patient meet 2 or more of the SIRS criteria? Yes  Does the patient have a confirmed or suspected source of infection? No  MEWS guidelines implemented  Yes, red  Treat  MEWS Interventions Considered administering scheduled or prn medications/treatments as ordered  Take Vital Signs  Increase Vital Sign Frequency  Red: Q1hr x2, continue Q4hrs until patient remains green for 12hrs  Escalate  MEWS: Escalate Red: Discuss with charge nurse and notify provider. Consider notifying RRT. If remains red for 2 hours consider need for higher level of care  Notify: Charge Nurse/RN  Name of Charge Nurse/RN Administrator, Arts, RN  Assess: SIRS CRITERIA  SIRS Temperature  0  SIRS Respirations  1  SIRS Pulse 1  SIRS WBC 0  SIRS Score Sum  2

## 2024-05-27 NOTE — Progress Notes (Addendum)
 Progress Note   Patient: Donald [name] FMW:[id number] DOB: Jun 06, 1986 DOA: 05/26/2024     0 DOS: the patient was seen and examined on 05/27/2024   Brief hospital course: Donald [name] was admitted to the hospital with the working diagnosis of atrial flutter.   38 yo male with the past medical history of heart failure, deep vein thrombosis, coronary artery disease, and atrial flutter who presented with palpitations. Patient has been experiencing nausea and vomiting for the last 3 to 4 weeks, and has not been able to take his medications consistently  12/10 ED visit at [facility] for atrial flutter, he converted to sinus rhythm in the ED, he was discharged with an increased dose of amiodarone  200 mg bid.  At home he continue to have palpitations and chest discomfort prompting him to coma to the ED.  On his initial physical examination his blood pressure was 105/88, HR 121 and 80, RR 22 and 02 saturation 90%  Lungs with no wheezing or rhonchi, heart with S1 and S2 present and regular, abdomen soft and not distended, no lower extremity edema.   Na 131, K 3.1 Cl 97 bicarbonate 25 glucose 127 bun6 cr 1,13  Mg 1,7  High sensitive troponin 27  Wbc 10,4 hgb 9,4 plt 459   Chest radiograph with cardiomegaly, bilateral hilar vascular congestion, with no effusions, sternotomy wires in place.   EKG 112 bpm, normal axis, qtc 540, sinus rhythm with PAC, q waves lead II, III, aVF, V5 to V6, with no significant ST segment or T wave changes.   Assessment and Plan: * Acute on chronic systolic CHF (congestive heart failure) (HCC) Echocardiogram from 07/2023 with reduced LV systolic function to 25 to 30%, with global hypokinesis. Sp mitral valve replacement.   Medical non adherence. Clinically with signs of congestion.   Plan to start patient on furosemide  60 mg IV bid.  Continue digoxin , entresto  and spironolactone .  Add SGLT 2 inh to augment diuresis.  Follow up on new echocardiogram.   Coronary artery  disease Elevated high sensitive troponin due to heart failure exacerbation.  Has chronic infero lateral q waves, acute coronary syndrome has been ruled out.    Paroxysmal atrial fibrillation (HCC) Patient on sinus rhythm, with PAC Plan to continue amiodarone  and digoxin .  Anticoagulation with warfarin per pharmacy protocol.  Currently supra therapeutic INR at 4.2  Keep K at 4 and Mg at 2.   Hypokalemia Hyponatremia.   Follow up renal function stable and K has been corrected.  Add 2 g mg sulfate and follow up renal function and electrolytes in am.  Continue diuresis.   History of DVT (deep vein thrombosis) Continue anticoagulation with warfarin Follow up pharmacy protocol Currently with suprathereputic INR   Nausea and vomiting Add pre meal metoclopramide and antiacid therapy with pantoprazole .   Obesity, class 1 Calculated BMI 34.7    Subjective: patient with chest discomfort, with no lower extremity edema, no palpitations. Intermittent nausea and vomiting.   Physical Exam: Vitals:   05/27/24 0757 05/27/24 0800 05/27/24 0815 05/27/24 0830  BP:  98/82  96/67  Pulse:      Resp:  19 (!) 24 13  Temp: 98.1 F (36.7 C)     TempSrc: Oral     SpO2:       Neurology awake and alert ENT with mild pallor with no icterus Cardiovascular with S1 and S2 present and regular with no gallops, rubs or murmurs Positive JVD Respiratory with no wheezing or rhonchi, rales at  bases Abdomen with no distention, soft and non tender No lower extremity edema  Data Reviewed:    Family Communication: no family at the bedside  Disposition: Status is: Inpatient Remains inpatient appropriate because: IV diuresis   Planned Discharge Destination: Home     Author: Elidia Toribio Furnace, MD 05/27/2024 9:20 AM  For on call review [email or web address].

## 2024-05-27 NOTE — Assessment & Plan Note (Addendum)
 Echocardiogram with reduced LV systolic function EF 20 to 25%, global hypokinesis, moderate dilatation LV cavity, mild LVH, RV systolic function with moderate reduction, positive mechanical mitral valve, calcification of the aortic valve with no stenosis.   Urine output is 2,225  ml Systolic blood pressure 90 to 899 mmHg.   Plan to continue spironolactone , SGLT 2 inh and digoxin  Add low dose losartan  today  Continue to hold on loop diuretic today.  If blood pressure stable plan for discharge home tomorrow.

## 2024-05-27 NOTE — ED Notes (Signed)
 CCMD called.

## 2024-05-27 NOTE — ED Notes (Signed)
 Pt ambulated to bathroom with no assistance.

## 2024-05-27 NOTE — ED Notes (Signed)
 Asked phlebotomy to assist with getting cultures.

## 2024-05-27 NOTE — Assessment & Plan Note (Signed)
 Calculated BMI 34.7

## 2024-05-27 NOTE — Hospital Course (Addendum)
 Mr. Mayorga was admitted to the hospital with the working diagnosis of atrial flutter.   38 yo male with the past medical history of heart failure, deep vein thrombosis, coronary artery disease, and atrial flutter who presented with palpitations. Patient has been experiencing nausea and vomiting for the last 3 to 4 weeks, and has not been able to take his medications consistently  12/10 ED visit at [facility] for atrial flutter, he converted to sinus rhythm in the ED, he was discharged with an increased dose of amiodarone  200 mg bid.  At home he continue to have palpitations and chest discomfort prompting him to coma to the ED.  On his initial physical examination his blood pressure was 105/88, HR 121 and 80, RR 22 and 02 saturation 90%  Lungs with no wheezing or rhonchi, heart with S1 and S2 present and regular, abdomen soft and not distended, no lower extremity edema.   Na 131, K 3.1 Cl 97 bicarbonate 25 glucose 127 bun6 cr 1,13  Mg 1,7  High sensitive troponin 27  Wbc 10,4 hgb 9,4 plt 459   Chest radiograph with cardiomegaly, bilateral hilar vascular congestion, with no effusions, sternotomy wires in place.   EKG 112 bpm, normal axis, qtc 540, sinus rhythm with PAC, q waves lead II, III, aVF, V5 to V6, with no significant ST segment or T wave changes.   12/14 patient with improved volume status, resumed on heart failure guideline directed medical therapy. If blood pressure stable plan for discharge home tomorrow.  12/15 subtherapeutic INR, placed on enoxaparin  for bridge.

## 2024-05-27 NOTE — H&P (Signed)
 History and Physical    JOMAR DENZ FMW:[id number] DOB: 1986-03-18 DOA: 05/26/2024  PCP: Alston Silvio BROCKS, FNP   Chief Complaint: Heart palpitations  HPI: Donald [name] is a 38 y.o. male with medical history significant of CHF, DVT on warfarin, mitral stenosis, CAD, atrial flutter who presented to the emergency department due to heart palpitations lightheadedness.  Patient's been feeling unwell for several weeks and went to [facility].  He spontaneously converted to normal sinus rhythm and was discharged with outpatient follow-up.  He has continued to feel poorly and presented to [facility] for further evaluation.  On evaluation he was endorsing nausea heart palpitations as well as chest discomfort.  Labs were obtained on presentation which showed INR 4.2, troponin 26, 27, WBC 10.4, hemoglobin 9.4, potassium 3.1, creatinine 1.1. On admit he was endorsing chest pain at rest and shortness of breath. Pain is nonexertional in nature. He denies any weight gain or other symptoms.    Review of Systems: Review of Systems  Constitutional: Negative.  Negative for chills and fever.  HENT: Negative.    Eyes: Negative.   Respiratory: Negative.    Cardiovascular:  Positive for chest pain, palpitations and orthopnea.  Gastrointestinal: Negative.   Genitourinary: Negative.   Musculoskeletal: Negative.   Skin: Negative.   Neurological: Negative.   Endo/Heme/Allergies: Negative.   Psychiatric/Behavioral: Negative.       As per HPI otherwise 10 point review of systems negative.   Allergies[1]  Past Medical History:  Diagnosis Date   Anemia    Autoimmune disorder    pyoderma gangrenosum   CHF (congestive heart failure) (HCC)    Chronic systolic heart failure (HCC)    a. EF 35-40% by echo in 07/2018 b. EF at 45% by repeat echo in 04/2020   DVT (deep venous thrombosis) (HCC)    h/o   Dysrhythmia    Hidradenitis suppurativa    Mitral regurgitation    a. s/p MV repair with resection  of ruptured anterior papillary muscle and reconstruction of papillary chord and placement of annuloplasty ring in 2019. b. 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   Mitral stenosis    Myocardial infarction (HCC)    Paroxysmal atrial flutter (HCC)    Pyoderma gangrenosa (HCC)    Seronegative spondylitis    arthritis   Shock, septic and cardiogenic 12/28/2021   Tricuspid regurgitation     Past Surgical History:  Procedure Laterality Date   CARDIAC CATHETERIZATION     HERNIA REPAIR Right 2018   MITRAL VALVE REPAIR  06/07/2018   [facility] - Dr Lamar Server   MITRAL VALVE REPLACEMENT N/A 06/23/2021   MULTIPLE EXTRACTIONS WITH ALVEOLOPLASTY N/A 11/08/2020   Procedure: EXTRACTION OF TEETH NUMBER ONE, FIFTHTEEN, SIXTEEN, SEVENTEEN, EIGHTTEEN AND THRTY WITH ALVEOLOPLASTY OF LOWER LEFT QUADRANT.;  Surgeon: Celena Lum NOVAK, DMD;  Location: [facility] OR;  Service: Dentistry;  Laterality: N/A;   RIGHT/LEFT HEART CATH AND CORONARY ANGIOGRAPHY N/A 09/11/2020   Procedure: RIGHT/LEFT HEART CATH AND CORONARY ANGIOGRAPHY;  Surgeon: Cherrie Toribio JONELLE, MD;  Location: [facility] INVASIVE CV LAB;  Service: Cardiovascular;  Laterality: N/A;   TEE WITHOUT CARDIOVERSION N/A 05/23/2020   Procedure: TRANSESOPHAGEAL ECHOCARDIOGRAM (TEE) WITH PROPOFOL ;  Surgeon: Alvan Dorn FALCON, MD;  Location: [facility] ENDO SUITE;  Service: Endoscopy;  Laterality: N/A;     reports that he quit smoking about 3 years ago. His smoking use included cigarettes. He has never used smokeless tobacco. He reports that he does not drink alcohol and does not use drugs.  Family History  Problem Relation Age of Onset   Multiple sclerosis Mother    Psoriasis Mother    Depression Father    Diabetes Father    Diabetes Paternal Grandmother     Prior to Admission medications  Medication Sig Start Date End Date Taking? Authorizing Provider  amiodarone  (PACERONE ) 200 MG tablet Take 1 tablet (200 mg total) by mouth 2 (two) times daily. 05/25/24   Triplett, Tammy,  PA-C  digoxin  (LANOXIN ) 0.125 MG tablet Take 1 tablet (0.125 mg total) by mouth daily. 01/20/24   Glena Harlene HERO, FNP  furosemide  (LASIX ) 80 MG tablet Take 1 tablet (80 mg total) by mouth daily. 07/23/23   Bensimhon, Toribio SAUNDERS, MD  gabapentin  (NEURONTIN ) 300 MG capsule Take 2 capsules by mouth twice daily 05/16/24   Leigh Venetia CROME, MD  metFORMIN (GLUCOPHAGE) 500 MG tablet Take 500 mg by mouth daily.    [provider]  pantoprazole  (PROTONIX ) 40 MG tablet Take 1 tablet (40 mg total) by mouth daily. 10/16/22   Hayes Beckey CROME, NP  potassium chloride  SA (KLOR-CON  M) 20 MEQ tablet Take 1 tablet (20 mEq total) by mouth daily. 07/28/23   Bensimhon, Toribio SAUNDERS, MD  potassium chloride  SA (KLOR-CON  M) 20 MEQ tablet Take 1 tablet (20 mEq total) by mouth 2 (two) times daily. 05/25/24   Triplett, Tammy, PA-C  predniSONE  (DELTASONE ) 10 MG tablet Take 10 mg by mouth daily.    [provider]  sacubitril -valsartan  (ENTRESTO ) 24-26 MG Take 1 tablet by mouth 2 (two) times daily. 07/23/23   Bensimhon, Toribio SAUNDERS, MD  sertraline  (ZOLOFT ) 50 MG tablet Take 1 tablet (50 mg total) by mouth daily. 09/11/22   Bensimhon, Toribio SAUNDERS, MD  sildenafil  (VIAGRA ) 50 MG tablet Take 1 tablet (50 mg total) by mouth daily as needed for erectile dysfunction. 09/11/22   Bensimhon, Toribio SAUNDERS, MD  spironolactone  (ALDACTONE ) 25 MG tablet Take 0.5 tablets (12.5 mg total) by mouth daily. 09/11/22   Bensimhon, Toribio SAUNDERS, MD  ustekinumab (STELARA) 45 MG/0.5ML injection Inject 45 mg into the skin. Inject 45 mg (0.5 ml) subcutaneously at week 0, 4 and every 12 weeks thereafter. 04/20/23   [provider]  warfarin (COUMADIN ) 5 MG tablet TAKE 1/2 TO 1  TABLET BY MOUTH ONCE DAILY OR AS DIRECTED BY COUMADIN  CLINIC Patient taking differently: Take 5 mg by mouth See admin instructions. TAKE 1/2 TO 1  TABLET BY MOUTH ONCE DAILY OR AS DIRECTED BY COUMADIN  CLINIC 05/18/24   Alvan Dorn FALCON, MD    Physical Exam: Vitals:   05/26/24 2145  05/26/24 2215 05/26/24 2315 05/26/24 2335  BP: 105/88   (!) 124/91  Pulse: (!) 121 73 81 80  Resp: 17 14 16  (!) 22  Temp:      SpO2: 90% 100% 100% 100%   Physical Exam Constitutional:      Appearance: He is normal weight.  HENT:     Head: Normocephalic.  Eyes:     Pupils: Pupils are equal, round, and reactive to light.  Cardiovascular:     Rate and Rhythm: Normal rate and regular rhythm.     Heart sounds: Normal heart sounds.  Pulmonary:     Effort: Pulmonary effort is normal.  Abdominal:     General: Bowel sounds are normal.     Palpations: Abdomen is soft.  Musculoskeletal:        General: Normal range of motion.     Cervical back: Normal range of motion and neck supple.  Skin:    General: Skin is warm.     Capillary Refill: Capillary refill takes less than 2 seconds.  Neurological:     General: No focal deficit present.     Mental Status: He is alert.  Psychiatric:        Mood and Affect: Mood normal.        Labs on Admission: I have personally reviewed the patients's labs and imaging studies.  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 # Supratherapeutic INR Patient with chest pain occurring at rest and exertion Patient denies any significant weight gain - Last echocardiogram in February showed EF 25% - Trops downtrending Plan: Repeat echocardiogram Repeat EKG for chest pain Continue warfarin Continue goal-directed medical therapy with Aldactone , Entresto   # History of A-fib-continue amiodarone , digoxin   # GERD-continue Protonix   # Chronic pain-continue gabapentin   # Depression-continue Zoloft   # Hypokalemia-replete potassium Admission status: Inpatient Telemetry  Certification: The appropriate patient status for this patient is INPATIENT. Inpatient status is judged to be reasonable and necessary in order to provide the required intensity of service to  ensure the patient's safety. The patient's presenting symptoms, physical exam findings, and initial radiographic and laboratory data in the context of their chronic comorbidities is felt to place them at high risk for further clinical deterioration. Furthermore, it is not anticipated that the patient will be medically stable for discharge from the hospital within 2 midnights of admission.   * I certify that at the point of admission it is my clinical judgment that the patient will require inpatient hospital care spanning beyond 2 midnights from the point of admission due to high intensity of service, high risk for further deterioration and high frequency of surveillance required.DEWAINE Lamar Dess MD [facility] If 7PM-7AM, please contact night-coverage [email or web address]  05/27/2024, 12:15 AM        [1] No Known Allergies

## 2024-05-27 NOTE — Progress Notes (Signed)
°  Echocardiogram 2D Echocardiogram has been performed.  Tinnie FORBES Gosling RDCS 05/27/2024, 8:41 AM

## 2024-05-27 NOTE — Assessment & Plan Note (Deleted)
 Hypokalemia  Hyponatremia.   Renal function today with serum cr at 1,58 with K at 3,1 and serum bicarbonate at 28  Mg 1,9   Plan to add 40 meq Kcl x2 and 2 g Mg sulfate.  Continue to hold on loop diuretic today Follow up renal function and electrolytes.

## 2024-05-27 NOTE — ED Notes (Addendum)
 Phlebotomist unable to get cultures or hepatic panel. MD made aware labs were not drawn.

## 2024-05-27 NOTE — ED Notes (Signed)
 Phlebotomy in room attempting to get cultures

## 2024-05-27 NOTE — Progress Notes (Signed)
 PHARMACY - ANTICOAGULATION CONSULT NOTE  Pharmacy Consult for Coumadin  Indication: mechanical valve  Allergies[1]  Patient Measurements:    Vital Signs: Temp: 98.3 F (36.8 C) (12/12 0257) Temp Source: Oral (12/12 0257) BP: 102/59 (12/12 0030) Pulse Rate: 74 (12/12 0245)  Labs: Recent Labs    05/25/24 0920 05/25/24 1510 05/26/24 2001 05/26/24 2129 05/27/24 0355  HGB 9.2*  --  9.4*  --   --   HCT 30.1*  --  29.9*  --   --   PLT 462*  --  459*  --   --   APTT 60*  --   --   --   --   LABPROT 39.3*  --   --  42.4*  --   INR 3.8*  --   --  4.2*  --   CREATININE 1.17 1.07 1.13  --  1.06  TROPONINIHS  --   --  27* 26*  --     Estimated Creatinine Clearance: 116 mL/min (by C-G formula based on SCr of 1.06 mg/dL).   Medical History: Past Medical History:  Diagnosis Date   Anemia    Autoimmune disorder    pyoderma gangrenosum   CHF (congestive heart failure) (HCC)    Chronic systolic heart failure (HCC)    a. EF 35-40% by echo in 07/2018 b. EF at 45% by repeat echo in 04/2020   DVT (deep venous thrombosis) (HCC)    h/o   Dysrhythmia    Hidradenitis suppurativa    Mitral regurgitation    a. 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 b. 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   Mitral stenosis    Myocardial infarction (HCC)    Paroxysmal atrial flutter (HCC)    Pyoderma gangrenosa (HCC)    Seronegative spondylitis    arthritis   Shock, septic and cardiogenic 12/28/2021   Tricuspid regurgitation     Medications:  Medications Ordered Prior to Encounter[2]   Assessment: 38 y.o. male admitted with CHF, h/o mechanical mitral valve, to continue Coumadin .  Current home regimen Coumadin  5 mg MWF and 2.5 mg TTSS.   INR supratherapeutic on admit   Goal of Therapy:  INR 2.5-3.5 Monitor platelets by anticoagulation protocol: Yes   Plan:  No Coumadin  today Daily INR  Monai Hindes,  Cordella Misty 05/27/2024,5:03 AM      [1] No Known Allergies [2]  No current facility-administered medications on file prior to encounter.   Current Outpatient Medications on File Prior to Encounter  Medication Sig Dispense Refill   amiodarone  (PACERONE ) 200 MG tablet Take 1 tablet (200 mg total) by mouth 2 (two) times daily. 30 tablet 6   digoxin  (LANOXIN ) 0.125 MG tablet Take 1 tablet (0.125 mg total) by mouth daily. 90 tablet 3   furosemide  (LASIX ) 80 MG tablet Take 1 tablet (80 mg total) by mouth daily. 90 tablet 1   gabapentin  (NEURONTIN ) 300 MG capsule Take 2 capsules by mouth twice daily 120 capsule 0   metFORMIN (GLUCOPHAGE) 500 MG tablet Take 500 mg by mouth daily.     pantoprazole  (PROTONIX ) 40 MG tablet Take 1 tablet (40 mg total) by mouth daily. 30 tablet 8   potassium chloride  SA (KLOR-CON  M) 20 MEQ tablet Take 1 tablet (20 mEq total) by mouth daily. 30 tablet 6   potassium chloride  SA (KLOR-CON  M) 20 MEQ tablet Take 1 tablet (20 mEq total) by mouth 2 (two) times daily. 6  tablet 0   predniSONE  (DELTASONE ) 10 MG tablet Take 10 mg by mouth daily.     sacubitril -valsartan  (ENTRESTO ) 24-26 MG Take 1 tablet by mouth 2 (two) times daily. 180 tablet 1   sertraline  (ZOLOFT ) 50 MG tablet Take 1 tablet (50 mg total) by mouth daily. 60 tablet 4   sildenafil  (VIAGRA ) 50 MG tablet Take 1 tablet (50 mg total) by mouth daily as needed for erectile dysfunction. 10 tablet 1   spironolactone  (ALDACTONE ) 25 MG tablet Take 0.5 tablets (12.5 mg total) by mouth daily. 90 tablet 1   ustekinumab (STELARA) 45 MG/0.5ML injection Inject 45 mg into the skin. Inject 45 mg (0.5 ml) subcutaneously at week 0, 4 and every 12 weeks thereafter.     warfarin (COUMADIN ) 5 MG tablet TAKE 1/2 TO 1  TABLET BY MOUTH ONCE DAILY OR AS DIRECTED BY COUMADIN  CLINIC (Patient taking differently: Take 5 mg by mouth See admin instructions. TAKE 1/2 TO 1  TABLET BY MOUTH ONCE DAILY OR AS DIRECTED BY COUMADIN  CLINIC) 45 tablet  5

## 2024-05-27 NOTE — ED Notes (Addendum)
 ED phlebotomist attempted to attain blood on pt. Blood draw unsuccessful. Rn aware.

## 2024-05-27 NOTE — Assessment & Plan Note (Addendum)
 Continue anticoagulation with warfarin. Plan to continue enoxaparin  for anticoagulation bridging.   Chronic anemia, hgb today is 10.1, follow up as outpatient.  Reactive leukocytosis at 12.9, no signs of systemic infection, will continue to hold on antibiotic therapy and have follow up as outpatient.

## 2024-05-28 DIAGNOSIS — E66811 Obesity, class 1: Secondary | ICD-10-CM | POA: Diagnosis not present

## 2024-05-28 DIAGNOSIS — Z86718 Personal history of other venous thrombosis and embolism: Secondary | ICD-10-CM | POA: Diagnosis not present

## 2024-05-28 DIAGNOSIS — I480 Paroxysmal atrial fibrillation: Secondary | ICD-10-CM | POA: Diagnosis not present

## 2024-05-28 DIAGNOSIS — I2583 Coronary atherosclerosis due to lipid rich plaque: Secondary | ICD-10-CM | POA: Diagnosis not present

## 2024-05-28 DIAGNOSIS — I5023 Acute on chronic systolic (congestive) heart failure: Secondary | ICD-10-CM | POA: Diagnosis not present

## 2024-05-28 DIAGNOSIS — N179 Acute kidney failure, unspecified: Secondary | ICD-10-CM | POA: Diagnosis present

## 2024-05-28 DIAGNOSIS — I2510 Atherosclerotic heart disease of native coronary artery without angina pectoris: Secondary | ICD-10-CM | POA: Diagnosis not present

## 2024-05-28 DIAGNOSIS — R112 Nausea with vomiting, unspecified: Secondary | ICD-10-CM | POA: Diagnosis present

## 2024-05-28 LAB — CBC
HCT: 30.3 % — ABNORMAL LOW (ref 39.0–52.0)
Hemoglobin: 9.7 g/dL — ABNORMAL LOW (ref 13.0–17.0)
MCH: 24.4 pg — ABNORMAL LOW (ref 26.0–34.0)
MCHC: 32 g/dL (ref 30.0–36.0)
MCV: 76.1 fL — ABNORMAL LOW (ref 80.0–100.0)
Platelets: 417 K/uL — ABNORMAL HIGH (ref 150–400)
RBC: 3.98 MIL/uL — ABNORMAL LOW (ref 4.22–5.81)
RDW: 18.2 % — ABNORMAL HIGH (ref 11.5–15.5)
WBC: 12.2 K/uL — ABNORMAL HIGH (ref 4.0–10.5)
nRBC: 0 % (ref 0.0–0.2)

## 2024-05-28 LAB — BRAIN NATRIURETIC PEPTIDE: B Natriuretic Peptide: 538.6 pg/mL — ABNORMAL HIGH (ref 0.0–100.0)

## 2024-05-28 LAB — BASIC METABOLIC PANEL WITH GFR
Anion gap: 6 (ref 5–15)
Anion gap: 7 (ref 5–15)
BUN: 5 mg/dL — ABNORMAL LOW (ref 6–20)
BUN: 6 mg/dL (ref 6–20)
CO2: 28 mmol/L (ref 22–32)
CO2: 29 mmol/L (ref 22–32)
Calcium: 7.4 mg/dL — ABNORMAL LOW (ref 8.9–10.3)
Calcium: 7.7 mg/dL — ABNORMAL LOW (ref 8.9–10.3)
Chloride: 96 mmol/L — ABNORMAL LOW (ref 98–111)
Chloride: 97 mmol/L — ABNORMAL LOW (ref 98–111)
Creatinine, Ser: 1.58 mg/dL — ABNORMAL HIGH (ref 0.61–1.24)
Creatinine, Ser: 1.6 mg/dL — ABNORMAL HIGH (ref 0.61–1.24)
GFR, Estimated: 57 mL/min — ABNORMAL LOW (ref >60)
GFR, Estimated: 58 mL/min — ABNORMAL LOW (ref >60)
Glucose, Bld: 119 mg/dL — ABNORMAL HIGH (ref 70–99)
Glucose, Bld: 148 mg/dL — ABNORMAL HIGH (ref 70–99)
Potassium: 3 mmol/L — ABNORMAL LOW (ref 3.5–5.1)
Potassium: 3.1 mmol/L — ABNORMAL LOW (ref 3.5–5.1)
Sodium: 131 mmol/L — ABNORMAL LOW (ref 135–145)
Sodium: 132 mmol/L — ABNORMAL LOW (ref 135–145)

## 2024-05-28 LAB — LACTIC ACID, PLASMA: Lactic Acid, Venous: 1.7 mmol/L (ref 0.5–1.9)

## 2024-05-28 LAB — PROTIME-INR
INR: 4.6 (ref 0.8–1.2)
INR: 4.7 (ref 0.8–1.2)
Prothrombin Time: 45.2 s — ABNORMAL HIGH (ref 11.4–15.2)
Prothrombin Time: 46.5 s — ABNORMAL HIGH (ref 11.4–15.2)

## 2024-05-28 LAB — MAGNESIUM: Magnesium: 1.9 mg/dL (ref 1.7–2.4)

## 2024-05-28 MED ORDER — DOCUSATE SODIUM 50 MG PO CAPS
50.0000 mg | ORAL_CAPSULE | Freq: Once | ORAL | Status: AC | PRN
  Administered 2024-05-28: 50 mg via ORAL
  Filled 2024-05-28: qty 1

## 2024-05-28 MED ORDER — POTASSIUM CHLORIDE CRYS ER 20 MEQ PO TBCR
40.0000 meq | EXTENDED_RELEASE_TABLET | Freq: Once | ORAL | Status: AC
  Administered 2024-05-28: 40 meq via ORAL
  Filled 2024-05-28: qty 2

## 2024-05-28 MED ORDER — METOCLOPRAMIDE HCL 5 MG PO TABS
5.0000 mg | ORAL_TABLET | Freq: Three times a day (TID) | ORAL | Status: DC
  Administered 2024-05-28 – 2024-05-31 (×9): 5 mg via ORAL
  Filled 2024-05-28 (×10): qty 1

## 2024-05-28 MED ORDER — ORAL CARE MOUTH RINSE: 15.0000 mL | OROMUCOSAL | Status: DC | PRN

## 2024-05-28 MED ORDER — CHLORHEXIDINE GLUCONATE 4 % EX SOLN
Freq: Two times a day (BID) | CUTANEOUS | Status: DC
  Administered 2024-05-28 (×2): 2 via TOPICAL
  Administered 2024-05-29 – 2024-05-31 (×3): 1 via TOPICAL
  Filled 2024-05-28 (×5): qty 15
  Filled 2024-05-28 (×2): qty 30

## 2024-05-28 MED ORDER — PHYTONADIONE 1 MG/0.5 ML ORAL SOLUTION
1.0000 mg | Freq: Once | ORAL | Status: AC
  Administered 2024-05-28: 1 mg via ORAL
  Filled 2024-05-28: qty 0.5

## 2024-05-28 MED ORDER — POTASSIUM CHLORIDE CRYS ER 20 MEQ PO TBCR
40.0000 meq | EXTENDED_RELEASE_TABLET | ORAL | Status: AC
  Administered 2024-05-28 (×2): 40 meq via ORAL
  Filled 2024-05-28 (×2): qty 2

## 2024-05-28 MED ORDER — INFLUENZA VIRUS VACC SPLIT PF (FLUZONE) 0.5 ML IM SUSY: 0.5000 mL | PREFILLED_SYRINGE | INTRAMUSCULAR | Status: DC

## 2024-05-28 MED ORDER — SPIRONOLACTONE 12.5 MG HALF TABLET
12.5000 mg | ORAL_TABLET | Freq: Every day | ORAL | Status: DC
  Administered 2024-05-28 – 2024-05-31 (×4): 12.5 mg via ORAL
  Filled 2024-05-28 (×4): qty 1

## 2024-05-28 MED ORDER — ENSURE PLUS HIGH PROTEIN PO LIQD
237.0000 mL | Freq: Two times a day (BID) | ORAL | Status: DC
  Administered 2024-05-29 – 2024-05-31 (×5): 237 mL via ORAL

## 2024-05-28 MED ORDER — PHYTONADIONE 5 MG PO TABS
2.5000 mg | ORAL_TABLET | Freq: Once | ORAL | Status: DC
  Filled 2024-05-28: qty 1

## 2024-05-28 NOTE — Assessment & Plan Note (Signed)
 Hypokalemia  Hyponatremia.   Today renal function with serum cr at 1,53 with K at 3,8 and serum bicarbonate at 24 Na 132   Continue with SGLT 2 ing and spironolactone   Continue to hold on loop diuretic for now.

## 2024-05-28 NOTE — Progress Notes (Signed)
 Date and time results received: 05/28/2024 [id number]  Test: INR Critical Value: 4.6  Name of Provider Notified: Dr. Alfornia  Orders Received? Or Actions Taken?: No new orders  Sela Dose, RN

## 2024-05-28 NOTE — Progress Notes (Signed)
 PHARMACY - ANTICOAGULATION CONSULT NOTE  Pharmacy Consult for Coumadin  Indication: mechanical valve  Allergies[1]  Patient Measurements: Height: 5' 9 (175.3 cm) Weight: 106.7 kg (235 lb 3.2 oz) IBW/kg (Calculated) : 70.7 HEPARIN  DW (KG): 93.9  Vital Signs: Temp: 97.8 F (36.6 C) (12/13 0811) Temp Source: Oral (12/13 0811) BP: 97/66 (12/13 0811) Pulse Rate: 77 (12/13 0811)  Labs: Recent Labs    05/26/24 2001 05/26/24 2129 05/27/24 0355 05/27/24 2344 05/28/24 0345  HGB 9.4*  --   --  9.7*  --   HCT 29.9*  --   --  30.3*  --   PLT 459*  --   --  417*  --   LABPROT  --  42.4*  --  46.5* 45.2*  INR  --  4.2*  --  4.7* 4.6*  CREATININE 1.13  --  1.06 1.60* 1.58*  TROPONINIHS 27* 26*  --   --   --     Estimated Creatinine Clearance: 77.8 mL/min (A) (by C-G formula based on SCr of 1.58 mg/dL (H)).   Medical History: Past Medical History:  Diagnosis Date   Anemia    Autoimmune disorder    pyoderma gangrenosum   CHF (congestive heart failure) (HCC)    Chronic systolic heart failure (HCC)    a. EF 35-40% by echo in 07/2018 b. EF at 45% by repeat echo in 04/2020   DVT (deep venous thrombosis) (HCC)    h/o   Dysrhythmia    Hidradenitis suppurativa    Mitral regurgitation    a. 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 b. 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   Mitral stenosis    Myocardial infarction (HCC)    Paroxysmal atrial flutter (HCC)    Pyoderma gangrenosa (HCC)    Seronegative spondylitis    arthritis   Shock, septic and cardiogenic 12/28/2021   Tricuspid regurgitation     Medications:  Medications Ordered Prior to Encounter[2]   Assessment: 38 y.o. male admitted with CHF, h/o mechanical mitral valve, to continue Coumadin .  Current home regimen Coumadin  5 mg MWF and 2.5 mg TTSS.   INR supratherapeutic on admit   12/13: INR remains supratherapeutic at 4.6. Has  not received warfarin yet this admission. Patient was given phytonadione  1mg  po last night at 02:31. Hgb stable, plt WNL, and no reported issues with bleeding.  Goal of Therapy:  INR 2.5-3.5 Monitor platelets by anticoagulation protocol: Yes   Plan:  Continue to hold warfarin Reversal not indicated at this time given no major bleeding and INR remains < 5 Daily INR and CBC  Maurilio Patten, PharmD PGY1 Pharmacy Resident [facility] 05/28/2024 9:51 AM    [1] No Known Allergies [2]  No current facility-administered medications on file prior to encounter.   Current Outpatient Medications on File Prior to Encounter  Medication Sig Dispense Refill   amiodarone  (PACERONE ) 200 MG tablet Take 1 tablet (200 mg total) by mouth 2 (two) times daily. 30 tablet 6   digoxin  (LANOXIN ) 0.125 MG tablet Take 1 tablet (0.125 mg total) by mouth daily. 90 tablet 3   furosemide  (LASIX ) 80 MG tablet Take 1 tablet (80 mg total) by mouth daily. 90 tablet 1   gabapentin  (NEURONTIN ) 300 MG capsule Take 2 capsules by mouth twice daily 120 capsule 0   metFORMIN (GLUCOPHAGE) 500 MG tablet Take 500 mg by mouth daily.     pantoprazole  (PROTONIX ) 40 MG  tablet Take 1 tablet (40 mg total) by mouth daily. 30 tablet 8   potassium chloride  SA (KLOR-CON  M) 20 MEQ tablet Take 1 tablet (20 mEq total) by mouth daily. (Patient taking differently: Take 40 mEq by mouth 2 (two) times daily.) 30 tablet 6   predniSONE  (DELTASONE ) 10 MG tablet Take 10 mg by mouth daily.     sacubitril -valsartan  (ENTRESTO ) 24-26 MG Take 1 tablet by mouth 2 (two) times daily. 180 tablet 1   spironolactone  (ALDACTONE ) 25 MG tablet Take 0.5 tablets (12.5 mg total) by mouth daily. 90 tablet 1   ustekinumab (STELARA) 45 MG/0.5ML injection Inject 45 mg into the skin. Inject 45 mg (0.5 ml) subcutaneously at week 0, 4 and every 12 weeks thereafter.     warfarin (COUMADIN ) 5 MG tablet TAKE 1/2 TO 1  TABLET BY MOUTH ONCE DAILY OR AS DIRECTED BY COUMADIN   CLINIC (Patient taking differently: Take 5 mg by mouth daily.) 45 tablet 5   sertraline  (ZOLOFT ) 50 MG tablet Take 1 tablet (50 mg total) by mouth daily. (Patient not taking: Reported on 05/27/2024) 60 tablet 4   sildenafil  (VIAGRA ) 50 MG tablet Take 1 tablet (50 mg total) by mouth daily as needed for erectile dysfunction. (Patient not taking: Reported on 05/27/2024) 10 tablet 1

## 2024-05-28 NOTE — Progress Notes (Signed)
 Noted patient with boils with purulent serosanguinous foul smelling drainage on both his buttocks. Patient reports he has chronic boils which is being managed by his dermatologist. Dr. Alfornia made aware. Abdominal pad dressing applied on both buttocks.

## 2024-05-28 NOTE — Plan of Care (Signed)
  Problem: Clinical Measurements: Goal: Respiratory complications will improve Outcome: Progressing Goal: Cardiovascular complication will be avoided Outcome: Progressing   Problem: Activity: Goal: Risk for activity intolerance will decrease Outcome: Progressing   Problem: Elimination: Goal: Will not experience complications related to urinary retention Outcome: Progressing   Problem: Safety: Goal: Ability to remain free from injury will improve Outcome: Progressing   Problem: Activity: Goal: Ability to tolerate increased activity will improve Outcome: Progressing   Problem: Cardiac: Goal: Ability to achieve and maintain adequate cardiopulmonary perfusion will improve Outcome: Progressing

## 2024-05-28 NOTE — Progress Notes (Addendum)
 Progress Note   Patient: Donald [name] FMW:[id number] DOB: 1986-03-24 DOA: 05/26/2024     1 DOS: the patient was seen and examined on 05/28/2024   Brief hospital course: Donald [name] was admitted to the hospital with the working diagnosis of atrial flutter.   38 yo male with the past medical history of heart failure, deep vein thrombosis, coronary artery disease, and atrial flutter who presented with palpitations. Patient has been experiencing nausea and vomiting for the last 3 to 4 weeks, and has not been able to take his medications consistently  12/10 ED visit at [facility] for [facility] flutter, he converted to sinus rhythm in the ED, he was discharged with an increased dose of amiodarone  200 mg bid.  At home he continue to have palpitations and chest discomfort prompting him to coma to the ED.  On his initial physical examination his blood pressure was 105/88, HR 121 and 80, RR 22 and 02 saturation 90%  Lungs with no wheezing or rhonchi, heart with S1 and S2 present and regular, abdomen soft and not distended, no lower extremity edema.   Na 131, K 3.1 Cl 97 bicarbonate 25 glucose 127 bun6 cr 1,13  Mg 1,7  High sensitive troponin 27  Wbc 10,4 hgb 9,4 plt 459   Chest radiograph with cardiomegaly, bilateral hilar vascular congestion, with no effusions, sternotomy wires in place.   EKG 112 bpm, normal axis, qtc 540, sinus rhythm with PAC, q waves lead II, III, aVF, V5 to V6, with no significant ST segment or T wave changes.   Assessment and Plan: * Acute on chronic systolic CHF (congestive heart failure) (HCC) Echocardiogram with reduced LV systolic function EF 20 to 25%, global hypokinesis, moderate dilatation LV cavity, mild LVH, RV systolic function with moderate reduction, positive mechanical mitral valve, calcification of the aortic valve with no stenosis.   Urine output is 1250 ml Systolic blood pressure 90 to 899 mmHg.   Improved volume status, will hold loop diuretic for today.   Continue SGLT 2 inh, and digoxin  Resume spironolactone .   Limited medical therapy due to risk of hypotension   Coronary artery disease Elevated high sensitive troponin due to heart failure exacerbation.  Has chronic infero lateral q waves, acute coronary syndrome has been ruled out.    Paroxysmal atrial fibrillation (HCC) Patient on sinus rhythm, with PAC Plan to continue amiodarone  (increased dose to 200 mg bid) and digoxin .   Supra therapeutic INR, got 1 mg Vitamin K last night  INR today is 4.6  Continue anticoagulation per pharmacy protocol.    AKI (acute kidney injury) Hypokalemia  Hyponatremia.   Renal function today with serum cr at 1,58 with K at 3,1 and serum bicarbonate at 28  Mg 1,9   Plan to add 40 meq Kcl x2 and 2 g Mg sulfate.  Continue to hold on loop diuretic today Follow up renal function and electrolytes.   History of DVT (deep vein thrombosis) Continue anticoagulation with warfarin Follow up pharmacy protocol Currently with suprathereputic INR   Nausea and vomiting Continue with  pre meal metoclopramide  change to po Antiacid therapy with pantoprazole .   Obesity, class 1 Calculated BMI 34.7    Subjective: Patient is feeling better, dyspnea and edema are improving,he had hypotension last night.   Physical Exam: Vitals:   05/27/24 2328 05/28/24 0053 05/28/24 0548 05/28/24 0811  BP: (!) 79/69 91/61 95/75  97/66  Pulse: 68 85 71 77  Resp: 17 13 (!) 22 16  Temp: 98 F (  36.7 C) 98 F (36.7 C) 98.3 F (36.8 C) 97.8 F (36.6 C)  TempSrc: Oral Oral Oral Oral  SpO2: 96% 96% 95% 100%  Weight:      Height:       Neurology awake and alert ENT with mild pallor with no icterus Cardiovascular with S1 and S2 present and regular with positive systolic murmur at the left lower sternal border Mild JVD Respiratory with no rales or wheezing, no rhonchi  Abdomen with no distention, soft and non tender Lower extremity edema +   Data  Reviewed:    Family Communication: no family at the bedside   Disposition: Status is: Inpatient Remains inpatient appropriate because: IV diuresis   Planned Discharge Destination: Home     Author: Elidia Toribio Furnace, MD 05/28/2024 10:41 AM  For on call review [email or web address].

## 2024-05-28 NOTE — Progress Notes (Signed)
 Date and time results received: 05/28/2024 0028H  Test: INR Critical Value: 4.7  Name of Provider Notified: Dr. Alfornia  Orders Received? Or Actions Taken?: Orders Received - See Orders for details  Sela Dose, RN

## 2024-05-28 NOTE — Consult Note (Addendum)
 WOC Nurse Consult Note: patient with known history of hidradenitis suppurativa; followed by [facility]; last seen 02/23/2024 and ordered : Sulfacetamide sodium 9%, sulfur 3% foaming wash Reason for Consult: boils to B buttocks  Wound type:  draining pits B buttocks related to hidradenitis suppurativa (a chronic skin condition not a wound)  Pressure Injury POA: na not pressure related  Measurement: scattered over buttocks, not a wound  Wound bed: draining indentations in buttocks some red moist areas noted  Drainage (amount, consistency, odor) purulent foul smelling drainage  Periwound:chronic skin changes  Dressing procedure/placement/frequency: Cleanse B  buttocks with Hibiclens  twice daily and apply ABD pads to absorb drainage. Change ABD pads as needed for saturation.   HS is not a wound but a chronic skin condition that must be managed, no definitive cure available.    POC discussed with bedside nurse. WOC team will not follow. Reconsult if further needs arise.   Thanks,   [employer] MSN, RN-BC, [employer]

## 2024-05-29 ENCOUNTER — Other Ambulatory Visit (HOSPITAL_COMMUNITY): Payer: Self-pay

## 2024-05-29 DIAGNOSIS — I480 Paroxysmal atrial fibrillation: Secondary | ICD-10-CM | POA: Diagnosis not present

## 2024-05-29 DIAGNOSIS — Z86718 Personal history of other venous thrombosis and embolism: Secondary | ICD-10-CM | POA: Diagnosis not present

## 2024-05-29 DIAGNOSIS — R112 Nausea with vomiting, unspecified: Secondary | ICD-10-CM | POA: Diagnosis not present

## 2024-05-29 DIAGNOSIS — E66811 Obesity, class 1: Secondary | ICD-10-CM | POA: Diagnosis not present

## 2024-05-29 DIAGNOSIS — I5023 Acute on chronic systolic (congestive) heart failure: Secondary | ICD-10-CM | POA: Diagnosis not present

## 2024-05-29 DIAGNOSIS — I2583 Coronary atherosclerosis due to lipid rich plaque: Secondary | ICD-10-CM | POA: Diagnosis not present

## 2024-05-29 DIAGNOSIS — N179 Acute kidney failure, unspecified: Secondary | ICD-10-CM | POA: Diagnosis not present

## 2024-05-29 DIAGNOSIS — I2510 Atherosclerotic heart disease of native coronary artery without angina pectoris: Secondary | ICD-10-CM | POA: Diagnosis not present

## 2024-05-29 LAB — MAGNESIUM: Magnesium: 1.8 mg/dL (ref 1.7–2.4)

## 2024-05-29 LAB — BASIC METABOLIC PANEL WITH GFR
Anion gap: 9 (ref 5–15)
BUN: 6 mg/dL (ref 6–20)
CO2: 27 mmol/L (ref 22–32)
Calcium: 8 mg/dL — ABNORMAL LOW (ref 8.9–10.3)
Chloride: 97 mmol/L — ABNORMAL LOW (ref 98–111)
Creatinine, Ser: 1.38 mg/dL — ABNORMAL HIGH (ref 0.61–1.24)
GFR, Estimated: >60 mL/min (ref >60)
Glucose, Bld: 94 mg/dL (ref 70–99)
Potassium: 3.9 mmol/L (ref 3.5–5.1)
Sodium: 133 mmol/L — ABNORMAL LOW (ref 135–145)

## 2024-05-29 LAB — PROTIME-INR
INR: 2 — ABNORMAL HIGH (ref 0.8–1.2)
Prothrombin Time: 23.8 s — ABNORMAL HIGH (ref 11.4–15.2)

## 2024-05-29 LAB — CBC
HCT: 31.3 % — ABNORMAL LOW (ref 39.0–52.0)
Hemoglobin: 9.9 g/dL — ABNORMAL LOW (ref 13.0–17.0)
MCH: 24.4 pg — ABNORMAL LOW (ref 26.0–34.0)
MCHC: 31.6 g/dL (ref 30.0–36.0)
MCV: 77.3 fL — ABNORMAL LOW (ref 80.0–100.0)
Platelets: 356 K/uL (ref 150–400)
RBC: 4.05 MIL/uL — ABNORMAL LOW (ref 4.22–5.81)
RDW: 18.6 % — ABNORMAL HIGH (ref 11.5–15.5)
WBC: 10.8 K/uL — ABNORMAL HIGH (ref 4.0–10.5)
nRBC: 0 % (ref 0.0–0.2)

## 2024-05-29 MED ORDER — WARFARIN SODIUM 3 MG PO TABS
3.0000 mg | ORAL_TABLET | Freq: Once | ORAL | Status: AC
  Administered 2024-05-29: 3 mg via ORAL
  Filled 2024-05-29: qty 1

## 2024-05-29 MED ORDER — LOSARTAN POTASSIUM 25 MG PO TABS
25.0000 mg | ORAL_TABLET | Freq: Every day | ORAL | Status: DC
  Administered 2024-05-29: 25 mg via ORAL
  Filled 2024-05-29 (×2): qty 1

## 2024-05-29 MED ORDER — AMIODARONE HCL 200 MG PO TABS
200.0000 mg | ORAL_TABLET | Freq: Every day | ORAL | Status: DC
  Administered 2024-05-29 – 2024-05-31 (×3): 200 mg via ORAL
  Filled 2024-05-29 (×3): qty 1

## 2024-05-29 NOTE — Plan of Care (Signed)
°  Problem: Clinical Measurements: Goal: Respiratory complications will improve Outcome: Progressing Goal: Cardiovascular complication will be avoided Outcome: Progressing   Problem: Activity: Goal: Risk for activity intolerance will decrease Outcome: Progressing   Problem: Nutrition: Goal: Adequate nutrition will be maintained Outcome: Progressing   Problem: Pain Managment: Goal: General experience of comfort will improve and/or be controlled Outcome: Progressing   Problem: Safety: Goal: Ability to remain free from injury will improve Outcome: Progressing   Problem: Activity: Goal: Ability to tolerate increased activity will improve Outcome: Progressing   Problem: Cardiac: Goal: Ability to achieve and maintain adequate cardiopulmonary perfusion will improve Outcome: Progressing

## 2024-05-29 NOTE — Progress Notes (Signed)
 PHARMACY - ANTICOAGULATION CONSULT NOTE  Pharmacy Consult for Coumadin  Indication: mechanical valve  Allergies[1]  Patient Measurements: Height: 5' 9 (175.3 cm) Weight: 106.7 kg (235 lb 3.2 oz) IBW/kg (Calculated) : 70.7 HEPARIN  DW (KG): 93.9  Vital Signs: Temp: 97.8 F (36.6 C) (12/14 0803) Temp Source: Oral (12/14 0803) BP: 95/66 (12/14 0803) Pulse Rate: 83 (12/14 0803)  Labs: Recent Labs     0000 05/26/24 2001 05/26/24 2129 05/27/24 0355 05/27/24 2344 05/28/24 0345 05/29/24 0531 05/29/24 0900  HGB  --  9.4*  --   --  9.7*  --   --  9.9*  HCT  --  29.9*  --   --  30.3*  --   --  31.3*  PLT  --  459*  --   --  417*  --   --  356  LABPROT   < >  --  42.4*  --  46.5* 45.2*  --  23.8*  INR   < >  --  4.2*  --  4.7* 4.6*  --  2.0*  CREATININE  --  1.13  --    < > 1.60* 1.58* 1.38*  --   TROPONINIHS  --  27* 26*  --   --   --   --   --    < > = values in this interval not displayed.    Estimated Creatinine Clearance: 89.1 mL/min (A) (by C-G formula based on SCr of 1.38 mg/dL (H)).   Medical History: Past Medical History:  Diagnosis Date   Anemia    Autoimmune disorder    pyoderma gangrenosum   CHF (congestive heart failure) (HCC)    Chronic systolic heart failure (HCC)    a. EF 35-40% by echo in 07/2018 b. EF at 45% by repeat echo in 04/2020   DVT (deep venous thrombosis) (HCC)    h/o   Dysrhythmia    Hidradenitis suppurativa    Mitral regurgitation    a. 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 b. 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   Mitral stenosis    Myocardial infarction (HCC)    Paroxysmal atrial flutter (HCC)    Pyoderma gangrenosa (HCC)    Seronegative spondylitis    arthritis   Shock, septic and cardiogenic 12/28/2021   Tricuspid regurgitation     Medications:  Medications Ordered Prior to Encounter[2]   Assessment: 38 y.o. male admitted with  CHF, h/o mechanical mitral valve, to continue Coumadin .  Current home regimen Coumadin  5 mg MWF and 2.5 mg TTSS.   INR supratherapeutic on admit   12/13: INR remains supratherapeutic at 4.6. Has not received warfarin yet this admission. Patient was given phytonadione  1mg  po last night at 02:31. Hgb stable, plt WNL, and no reported issues with bleeding.  12/14: INR dropped significantly to 2 from 4.6 likely in setting of held doses and receipt of phytonadione  on 12/13. Hgb remains stable and plt WNL.   Goal of Therapy:  INR 2.5-3.5 Monitor platelets by anticoagulation protocol: Yes   Plan:  Give warfarin 3 mg today.  Daily INR and CBC  Maurilio Patten, PharmD PGY1 Pharmacy Resident [facility] 05/29/2024 9:50 AM    [1] No Known Allergies [2]  No current facility-administered medications on file prior to encounter.   Current Outpatient Medications on File Prior to Encounter  Medication Sig Dispense Refill   amiodarone  (PACERONE ) 200 MG tablet Take 1 tablet (  200 mg total) by mouth 2 (two) times daily. 30 tablet 6   digoxin  (LANOXIN ) 0.125 MG tablet Take 1 tablet (0.125 mg total) by mouth daily. 90 tablet 3   furosemide  (LASIX ) 80 MG tablet Take 1 tablet (80 mg total) by mouth daily. 90 tablet 1   gabapentin  (NEURONTIN ) 300 MG capsule Take 2 capsules by mouth twice daily 120 capsule 0   metFORMIN (GLUCOPHAGE) 500 MG tablet Take 500 mg by mouth daily.     pantoprazole  (PROTONIX ) 40 MG tablet Take 1 tablet (40 mg total) by mouth daily. 30 tablet 8   potassium chloride  SA (KLOR-CON  M) 20 MEQ tablet Take 1 tablet (20 mEq total) by mouth daily. (Patient taking differently: Take 40 mEq by mouth 2 (two) times daily.) 30 tablet 6   predniSONE  (DELTASONE ) 10 MG tablet Take 10 mg by mouth daily.     sacubitril -valsartan  (ENTRESTO ) 24-26 MG Take 1 tablet by mouth 2 (two) times daily. 180 tablet 1   spironolactone  (ALDACTONE ) 25 MG tablet Take 0.5 tablets (12.5 mg total) by mouth daily. 90  tablet 1   ustekinumab (STELARA) 45 MG/0.5ML injection Inject 45 mg into the skin. Inject 45 mg (0.5 ml) subcutaneously at week 0, 4 and every 12 weeks thereafter.     warfarin (COUMADIN ) 5 MG tablet TAKE 1/2 TO 1  TABLET BY MOUTH ONCE DAILY OR AS DIRECTED BY COUMADIN  CLINIC (Patient taking differently: Take 5 mg by mouth daily.) 45 tablet 5   sertraline  (ZOLOFT ) 50 MG tablet Take 1 tablet (50 mg total) by mouth daily. (Patient not taking: Reported on 05/27/2024) 60 tablet 4   sildenafil  (VIAGRA ) 50 MG tablet Take 1 tablet (50 mg total) by mouth daily as needed for erectile dysfunction. (Patient not taking: Reported on 05/27/2024) 10 tablet 1

## 2024-05-29 NOTE — Progress Notes (Signed)
 Progress Note   Patient: Donald [name] FMW:[id number] DOB: [birth year] DOA: 05/26/2024     2 DOS: the patient was seen and examined on 05/29/2024   Brief hospital course: Mr. Torr was admitted to the hospital with the working diagnosis of atrial flutter.   38 yo male with the past medical history of heart failure, deep vein thrombosis, coronary artery disease, and atrial flutter who presented with palpitations. Patient has been experiencing nausea and vomiting for the last 3 to 4 weeks, and has not been able to take his medications consistently  12/10 ED visit at [facility] for atrial flutter, he converted to sinus rhythm in the ED, he was discharged with an increased dose of amiodarone  200 mg bid.  At home he continue to have palpitations and chest discomfort prompting him to coma to the ED.  On his initial physical examination his blood pressure was 105/88, HR 121 and 80, RR 22 and 02 saturation 90%  Lungs with no wheezing or rhonchi, heart with S1 and S2 present and regular, abdomen soft and not distended, no lower extremity edema.   Na 131, K 3.1 Cl 97 bicarbonate 25 glucose 127 bun6 cr 1,13  Mg 1,7  High sensitive troponin 27  Wbc 10,4 hgb 9,4 plt 459   Chest radiograph with cardiomegaly, bilateral hilar vascular congestion, with no effusions, sternotomy wires in place.   EKG 112 bpm, normal axis, qtc 540, sinus rhythm with PAC, q waves lead II, III, aVF, V5 to V6, with no significant ST segment or T wave changes.   12/14 patient with improved volume status, resumed on heart failure guideline directed medical therapy. If blood pressure stable plan for discharge home tomorrow.   Assessment and Plan: * Acute on chronic systolic CHF (congestive heart failure) (HCC) Echocardiogram with reduced LV systolic function EF 20 to 25%, global hypokinesis, moderate dilatation LV cavity, mild LVH, RV systolic function with moderate reduction, positive mechanical mitral valve, calcification of  the aortic valve with no stenosis.   Urine output is 2,225  ml Systolic blood pressure 90 to 899 mmHg.   Plan to continue spironolactone , SGLT 2 inh and digoxin  Add low dose losartan  today  Continue to hold on loop diuretic today.  If blood pressure stable plan for discharge home tomorrow.   Coronary artery disease Elevated high sensitive troponin due to heart failure exacerbation.  Has chronic infero lateral q waves, acute coronary syndrome has been ruled out.    Paroxysmal atrial fibrillation (HCC) Patient on sinus rhythm, with PAC Plan to continue amiodarone  at home dose of 200 mg po daily.  Continue digoxin .   12/1 had 1 mg Vitamin K for supra therapeutic INR INR today is 2.0  Continue anticoagulation per pharmacy protocol.    AKI (acute kidney injury) Hypokalemia  Hyponatremia.   Renal function continue to improve with serum cr at 1,38 with K at 3,9 and serum bicarbonate at 27  Na 133 and Mg 1,8   Plan to add  2 g Mg sulfate.  Follow up renal function and electrolytes in am, continue with SGLT 2 ing and spironolactone    History of DVT (deep vein thrombosis) Continue anticoagulation with warfarin Follow up pharmacy protocol Currently with suprathereputic INR   Nausea and vomiting Symptoms have resolved, continue with pre meal metoclopramide  po.  Antiacid therapy with pantoprazole .   Obesity, class 1 Calculated BMI 34.7      Subjective: Patient with no chest pain and no dyspnea, no nausea or vomiting, no PND,  orthopnea or lower extremity edema   Physical Exam: Vitals:   05/29/24 0512 05/29/24 0803 05/29/24 1055 05/29/24 1201  BP: 100/65 95/66  92/65  Pulse: 78 83 88 93  Resp: 18 16  14   Temp: 98 F (36.7 C) 97.8 F (36.6 C)  98 F (36.7 C)  TempSrc: Oral Oral  Oral  SpO2: 97% 96%  99%  Weight:      Height:       Neurology awake and alert ENT with mild pallor with no icterus Cardiovascular with S1 and S2 present and regular with no gallops, rubs or  murmurs No JVD Respiratory with no rales or wheezing, no rhonchi Abdomen soft and non tender No lower extremity edema   Data Reviewed:    Family Communication: I spoke with patient's sister over the phone at the bedside, we talked in detail about patient's condition, plan of care and prognosis and all questions were addressed.   Disposition: Status is: Inpatient Remains inpatient appropriate because: plan for discharge home tomorrow if tolerating well medical therapy   Planned Discharge Destination: Home     Author: Elidia Toribio Furnace, MD 05/29/2024 1:21 PM  For on call review [email or web address].

## 2024-05-30 DIAGNOSIS — I5023 Acute on chronic systolic (congestive) heart failure: Secondary | ICD-10-CM | POA: Diagnosis not present

## 2024-05-30 DIAGNOSIS — I480 Paroxysmal atrial fibrillation: Secondary | ICD-10-CM | POA: Diagnosis not present

## 2024-05-30 DIAGNOSIS — N179 Acute kidney failure, unspecified: Secondary | ICD-10-CM | POA: Diagnosis not present

## 2024-05-30 DIAGNOSIS — I2583 Coronary atherosclerosis due to lipid rich plaque: Secondary | ICD-10-CM | POA: Diagnosis not present

## 2024-05-30 DIAGNOSIS — I2510 Atherosclerotic heart disease of native coronary artery without angina pectoris: Secondary | ICD-10-CM | POA: Diagnosis not present

## 2024-05-30 DIAGNOSIS — Z86718 Personal history of other venous thrombosis and embolism: Secondary | ICD-10-CM | POA: Diagnosis not present

## 2024-05-30 DIAGNOSIS — E66811 Obesity, class 1: Secondary | ICD-10-CM | POA: Diagnosis not present

## 2024-05-30 DIAGNOSIS — R112 Nausea with vomiting, unspecified: Secondary | ICD-10-CM | POA: Diagnosis not present

## 2024-05-30 LAB — CBC
HCT: 32.1 % — ABNORMAL LOW (ref 39.0–52.0)
Hemoglobin: 10 g/dL — ABNORMAL LOW (ref 13.0–17.0)
MCH: 24.2 pg — ABNORMAL LOW (ref 26.0–34.0)
MCHC: 31.2 g/dL (ref 30.0–36.0)
MCV: 77.7 fL — ABNORMAL LOW (ref 80.0–100.0)
Platelets: 373 K/uL (ref 150–400)
RBC: 4.13 MIL/uL — ABNORMAL LOW (ref 4.22–5.81)
RDW: 18.6 % — ABNORMAL HIGH (ref 11.5–15.5)
WBC: 12.9 K/uL — ABNORMAL HIGH (ref 4.0–10.5)
nRBC: 0 % (ref 0.0–0.2)

## 2024-05-30 LAB — PROTIME-INR
INR: 1.8 — ABNORMAL HIGH (ref 0.8–1.2)
Prothrombin Time: 22.2 s — ABNORMAL HIGH (ref 11.4–15.2)

## 2024-05-30 LAB — BASIC METABOLIC PANEL WITH GFR
Anion gap: 9 (ref 5–15)
BUN: 9 mg/dL (ref 6–20)
CO2: 24 mmol/L (ref 22–32)
Calcium: 8.1 mg/dL — ABNORMAL LOW (ref 8.9–10.3)
Chloride: 99 mmol/L (ref 98–111)
Creatinine, Ser: 1.53 mg/dL — ABNORMAL HIGH (ref 0.61–1.24)
GFR, Estimated: >60 mL/min (ref >60)
Glucose, Bld: 104 mg/dL — ABNORMAL HIGH (ref 70–99)
Potassium: 3.8 mmol/L (ref 3.5–5.1)
Sodium: 132 mmol/L — ABNORMAL LOW (ref 135–145)

## 2024-05-30 MED ORDER — LOSARTAN POTASSIUM 25 MG PO TABS
12.5000 mg | ORAL_TABLET | Freq: Every day | ORAL | Status: DC
  Administered 2024-05-30: 13:00:00 12.5 mg via ORAL
  Filled 2024-05-30 (×2): qty 1

## 2024-05-30 MED ORDER — WARFARIN SODIUM 5 MG PO TABS
5.0000 mg | ORAL_TABLET | Freq: Once | ORAL | Status: AC
  Administered 2024-05-30: 16:00:00 5 mg via ORAL
  Filled 2024-05-30: qty 1

## 2024-05-30 MED ORDER — ENOXAPARIN SODIUM 100 MG/ML IJ SOSY
100.0000 mg | PREFILLED_SYRINGE | Freq: Two times a day (BID) | INTRAMUSCULAR | Status: DC
  Administered 2024-05-30 – 2024-05-31 (×3): 100 mg via SUBCUTANEOUS
  Filled 2024-05-30 (×4): qty 1

## 2024-05-30 NOTE — Progress Notes (Signed)
 Heart Failure Navigator Progress Note  Assessed for Heart & Vascular TOC clinic readiness.  Patient does not meet criteria due to he is an Advanced Heart Failure Team patient of Dr. Bensimhon. .   Navigator will sign off at this time.   Stephane Haddock, BSN, Scientist, clinical (histocompatibility and immunogenetics) Only

## 2024-05-30 NOTE — Progress Notes (Signed)
 PHARMACY - ANTICOAGULATION CONSULT NOTE  Pharmacy Consult for Coumadin  Indication: mechanical valve  Allergies[1]  Patient Measurements: Height: 5' 9 (175.3 cm) Weight: 106.7 kg (235 lb 3.2 oz) IBW/kg (Calculated) : 70.7 HEPARIN  DW (KG): 93.9  Vital Signs: Temp: 98.1 F (36.7 C) (12/15 0833) Temp Source: Oral (12/15 0833) BP: 95/67 (12/15 0833) Pulse Rate: 84 (12/15 1005)  Labs: Recent Labs    05/27/24 2344 05/28/24 0345 05/29/24 0531 05/29/24 0900 05/30/24 0600  HGB 9.7*  --   --  9.9* 10.0*  HCT 30.3*  --   --  31.3* 32.1*  PLT 417*  --   --  356 373  LABPROT 46.5* 45.2*  --  23.8* 22.2*  INR 4.7* 4.6*  --  2.0* 1.8*  CREATININE 1.60* 1.58* 1.38*  --  1.53*    Estimated Creatinine Clearance: 80.3 mL/min (A) (by C-G formula based on SCr of 1.53 mg/dL (H)).   Medical History: Past Medical History:  Diagnosis Date   Anemia    Autoimmune disorder    pyoderma gangrenosum   CHF (congestive heart failure) (HCC)    Chronic systolic heart failure (HCC)    a. EF 35-40% by echo in 07/2018 b. EF at 45% by repeat echo in 04/2020   DVT (deep venous thrombosis) (HCC)    h/o   Dysrhythmia    Hidradenitis suppurativa    Mitral regurgitation    a. 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 b. 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   Mitral stenosis    Myocardial infarction (HCC)    Paroxysmal atrial flutter (HCC)    Pyoderma gangrenosa (HCC)    Seronegative spondylitis    arthritis   Shock, septic and cardiogenic 12/28/2021   Tricuspid regurgitation     Medications:  Medications Ordered Prior to Encounter[2]   Assessment: 38 y.o. male admitted with CHF, h/o mechanical mitral valve, to continue Coumadin .  Current home regimen Coumadin  5 mg MWF and 2.5 mg TTSS.   INR supratherapeutic on admit   -INR= 1.8 (warfarin restarted 12/14) -Plans to start lovenox  today (SCr 1.5, CrCl  ~ 80)  Goal of Therapy:  INR 2.5-3.5 Monitor platelets by anticoagulation protocol: Yes   Plan:  Give warfarin 5 mg today.  Daily INR  Lovenox  100mg  sq q12h  Prentice Poisson, PharmD Clinical Pharmacist **Pharmacist phone directory can now be found on [email or web address] ([email or web address]).  Listed under [facility].        [1] No Known Allergies [2]  No current facility-administered medications on file prior to encounter.   Current Outpatient Medications on File Prior to Encounter  Medication Sig Dispense Refill   amiodarone  (PACERONE ) 200 MG tablet Take 1 tablet (200 mg total) by mouth 2 (two) times daily. 30 tablet 6   digoxin  (LANOXIN ) 0.125 MG tablet Take 1 tablet (0.125 mg total) by mouth daily. 90 tablet 3   furosemide  (LASIX ) 80 MG tablet Take 1 tablet (80 mg total) by mouth daily. 90 tablet 1   gabapentin  (NEURONTIN ) 300 MG capsule Take 2 capsules by mouth twice daily 120 capsule 0   metFORMIN (GLUCOPHAGE) 500 MG tablet Take 500 mg by mouth daily.     pantoprazole  (PROTONIX ) 40 MG tablet Take 1 tablet (40 mg total) by mouth daily. 30 tablet 8   potassium chloride  SA (KLOR-CON  M) 20 MEQ tablet Take 1 tablet (20 mEq total) by mouth daily. (Patient taking differently: Take 40 mEq  by mouth 2 (two) times daily.) 30 tablet 6   predniSONE  (DELTASONE ) 10 MG tablet Take 10 mg by mouth daily.     sacubitril -valsartan  (ENTRESTO ) 24-26 MG Take 1 tablet by mouth 2 (two) times daily. 180 tablet 1   spironolactone  (ALDACTONE ) 25 MG tablet Take 0.5 tablets (12.5 mg total) by mouth daily. 90 tablet 1   ustekinumab (STELARA) 45 MG/0.5ML injection Inject 45 mg into the skin. Inject 45 mg (0.5 ml) subcutaneously at week 0, 4 and every 12 weeks thereafter.     warfarin (COUMADIN ) 5 MG tablet TAKE 1/2 TO 1  TABLET BY MOUTH ONCE DAILY OR AS DIRECTED BY COUMADIN  CLINIC (Patient taking differently: Take 5 mg by mouth daily.) 45 tablet 5   sertraline  (ZOLOFT ) 50 MG tablet Take 1 tablet (50 mg total) by mouth daily.  (Patient not taking: Reported on 05/27/2024) 60 tablet 4   sildenafil  (VIAGRA ) 50 MG tablet Take 1 tablet (50 mg total) by mouth daily as needed for erectile dysfunction. (Patient not taking: Reported on 05/27/2024) 10 tablet 1

## 2024-05-30 NOTE — Progress Notes (Signed)
 Progress Note   Patient: Donald [name] FMW:[id number] DOB: [birth year] DOA: 05/26/2024     3 DOS: the patient was seen and examined on 05/30/2024   Brief hospital course: Mr. Tomei was admitted to the hospital with the working diagnosis of atrial flutter.   38 yo male with the past medical history of heart failure, deep vein thrombosis, coronary artery disease, and atrial flutter who presented with palpitations. Patient has been experiencing nausea and vomiting for the last 3 to 4 weeks, and has not been able to take his medications consistently  12/10 ED visit at [facility] for atrial flutter, he converted to sinus rhythm in the ED, he was discharged with an increased dose of amiodarone  200 mg bid.  At home he continue to have palpitations and chest discomfort prompting him to coma to the ED.  On his initial physical examination his blood pressure was 105/88, HR 121 and 80, RR 22 and 02 saturation 90%  Lungs with no wheezing or rhonchi, heart with S1 and S2 present and regular, abdomen soft and not distended, no lower extremity edema.   Na 131, K 3.1 Cl 97 bicarbonate 25 glucose 127 bun6 cr 1,13  Mg 1,7  High sensitive troponin 27  Wbc 10,4 hgb 9,4 plt 459   Chest radiograph with cardiomegaly, bilateral hilar vascular congestion, with no effusions, sternotomy wires in place.   EKG 112 bpm, normal axis, qtc 540, sinus rhythm with PAC, q waves lead II, III, aVF, V5 to V6, with no significant ST segment or T wave changes.   12/14 patient with improved volume status, resumed on heart failure guideline directed medical therapy. If blood pressure stable plan for discharge home tomorrow.  12/15 subtherapeutic INR, placed on enoxaparin  for bridge.   Assessment and Plan: * Acute on chronic systolic CHF (congestive heart failure) (HCC) Echocardiogram with reduced LV systolic function EF 20 to 25%, global hypokinesis, moderate dilatation LV cavity, mild LVH, RV systolic function with moderate  reduction, positive mechanical mitral valve, calcification of the aortic valve with no stenosis.   Urine output is 1,457  ml Systolic blood pressure 90 to 899 mmHg.   Plan to continue spironolactone , SGLT 2 inh and digoxin  Reduce dose of losartan  to 12.5 mg daily  Continue to hold on loop diuretic today.  Coronary artery disease Elevated high sensitive troponin due to heart failure exacerbation.  Has chronic infero lateral q waves, acute coronary syndrome has been ruled out.    Paroxysmal atrial fibrillation (HCC) Patient on sinus rhythm, with PAC Plan to continue amiodarone  at home dose of 200 mg po daily.  Continue digoxin .   12/1 had 1 mg Vitamin K for supra therapeutic INR INR today is 1.8, plan to add enoxaparin  for bridging anticoagulation, for mechanica mitral valve needs INR 2,5 to 3.5  Continue anticoagulation per pharmacy protocol.    AKI (acute kidney injury) Hypokalemia  Hyponatremia.   Today renal function with serum cr at 1,53 with K at 3,8 and serum bicarbonate at 24 Na 132   Continue with SGLT 2 ing and spironolactone   Continue to hold on loop diuretic for now.   History of DVT (deep vein thrombosis) Continue anticoagulation with warfarin Follow up pharmacy protocol Currently with sub therapeutic INR   Nausea and vomiting Symptoms have resolved, continue with pre meal metoclopramide  po.  Antiacid therapy with pantoprazole .   Obesity, class 1 Calculated BMI 34.7         Subjective: patient is feeling well, has no chest  pain and no dyspnea, no PND, orthopnea or lower extremity edema   Physical Exam: Vitals:   05/29/24 2024 05/30/24 0003 05/30/24 0345 05/30/24 0833  BP: 110/73 90/67 96/66  95/67  Pulse: 88 85 86 83  Resp: 19 20 20 20   Temp: 98 F (36.7 C) 98.1 F (36.7 C) 98.3 F (36.8 C) 98.1 F (36.7 C)  TempSrc: Oral Oral Oral Oral  SpO2: 98% 94% 95% 95%  Weight:      Height:       Neurology awake and alert ENT with mild pallor with  no icterus Cardiovascular with S1 and S2 present and regular with no gallops, rubs or murmurs No JVD Respiratory with no rales or wheezing, no rhonchi  Abdomen soft and non tender,  No lower extremity edema  Data Reviewed:    Family Communication: his sister was on speaker phone .   Disposition: Status is: Inpatient Remains inpatient appropriate because: improvement in anticoagulation   Planned Discharge Destination: Home    Author: Elidia Toribio Furnace, MD 05/30/2024 9:16 AM  For on call review [email or web address].

## 2024-05-31 ENCOUNTER — Other Ambulatory Visit (HOSPITAL_COMMUNITY): Payer: Self-pay

## 2024-05-31 DIAGNOSIS — E66811 Obesity, class 1: Secondary | ICD-10-CM | POA: Diagnosis not present

## 2024-05-31 DIAGNOSIS — I2583 Coronary atherosclerosis due to lipid rich plaque: Secondary | ICD-10-CM | POA: Diagnosis not present

## 2024-05-31 DIAGNOSIS — I480 Paroxysmal atrial fibrillation: Secondary | ICD-10-CM | POA: Diagnosis not present

## 2024-05-31 DIAGNOSIS — L732 Hidradenitis suppurativa: Secondary | ICD-10-CM | POA: Diagnosis present

## 2024-05-31 DIAGNOSIS — R112 Nausea with vomiting, unspecified: Secondary | ICD-10-CM | POA: Diagnosis not present

## 2024-05-31 DIAGNOSIS — I5023 Acute on chronic systolic (congestive) heart failure: Secondary | ICD-10-CM | POA: Diagnosis not present

## 2024-05-31 DIAGNOSIS — Z86718 Personal history of other venous thrombosis and embolism: Secondary | ICD-10-CM | POA: Diagnosis not present

## 2024-05-31 DIAGNOSIS — I2510 Atherosclerotic heart disease of native coronary artery without angina pectoris: Secondary | ICD-10-CM | POA: Diagnosis not present

## 2024-05-31 DIAGNOSIS — N179 Acute kidney failure, unspecified: Secondary | ICD-10-CM | POA: Diagnosis not present

## 2024-05-31 LAB — PROTIME-INR
INR: 2 — ABNORMAL HIGH (ref 0.8–1.2)
Prothrombin Time: 23.7 s — ABNORMAL HIGH (ref 11.4–15.2)

## 2024-05-31 MED ORDER — AMIODARONE HCL 200 MG PO TABS: 200.0000 mg | ORAL_TABLET | Freq: Every day | ORAL | Status: AC

## 2024-05-31 MED ORDER — FUROSEMIDE 40 MG PO TABS: 40.0000 mg | ORAL_TABLET | Freq: Every day | ORAL | Status: DC | PRN

## 2024-05-31 MED ORDER — FUROSEMIDE 40 MG PO TABS
40.0000 mg | ORAL_TABLET | Freq: Every day | ORAL | 0 refills | Status: DC | PRN
  Filled 2024-05-31: qty 30, 30d supply, fill #0

## 2024-05-31 MED ORDER — SPIRONOLACTONE 25 MG PO TABS
12.5000 mg | ORAL_TABLET | Freq: Every day | ORAL | 0 refills | Status: AC
  Filled 2024-05-31: qty 30, 60d supply, fill #0

## 2024-05-31 MED ORDER — WARFARIN SODIUM 2.5 MG PO TABS: 2.5000 mg | ORAL_TABLET | Freq: Once | ORAL | Status: DC

## 2024-05-31 MED ORDER — ENOXAPARIN SODIUM 100 MG/ML IJ SOSY
100.0000 mg | PREFILLED_SYRINGE | Freq: Two times a day (BID) | INTRAMUSCULAR | 0 refills | Status: AC
  Filled 2024-05-31: qty 10, 5d supply, fill #0

## 2024-05-31 MED ORDER — LOSARTAN POTASSIUM 25 MG PO TABS
12.5000 mg | ORAL_TABLET | Freq: Every day | ORAL | 0 refills | Status: AC
  Filled 2024-05-31: qty 15, 30d supply, fill #0

## 2024-05-31 MED ORDER — DIGOXIN 125 MCG PO TABS
0.1250 mg | ORAL_TABLET | Freq: Every day | ORAL | 0 refills | Status: AC
  Filled 2024-05-31: qty 30, 30d supply, fill #0

## 2024-05-31 MED ORDER — METOCLOPRAMIDE HCL 5 MG PO TABS
5.0000 mg | ORAL_TABLET | Freq: Three times a day (TID) | ORAL | 0 refills | Status: AC | PRN
  Filled 2024-05-31: qty 90, 30d supply, fill #0

## 2024-05-31 NOTE — Progress Notes (Signed)
 Patient has received discharge education from d/c RN, patient verbalized understanding about medications and follow ups, patient said he has dressing supplies at home and is been doing dressing by himself, dressing changed before discharged, patient had no any concerns, pt is waiting for ride.

## 2024-05-31 NOTE — TOC Initial Note (Addendum)
 Transition of Care [facility]) - Initial/Assessment Note    Patient Details  Name: Donald [name] MRN: [id number] Date of Birth: [birth year]  [facility] CM/SW Contact:    Sudie Erminio Deems, RN Phone Number: 05/31/2024, 10:56 AM  Clinical Narrative:  Patient presented for shortness of breath. PTA patient was from home with family support of dad and son. Patient has PCP Bucio, Silvio BROCKS, FNP in [place]. PCP appointment to be arranged via CMA. Patient states he has transportation to appointments. Patient states he can afford medications. No further needs identified at this time. Patient states his uncle will provide transportation home.                  1129 05-31-24 Computer system at the PCP office is down. Patient is aware to call the office to schedule a PCP appointment within 1-2 weeks. No further needs identified at this time.  Expected Discharge Plan: Home/Self Care Barriers to Discharge: No Barriers Identified  Expected Discharge Plan and Services   Discharge Planning Services: Follow-up appt scheduled Post Acute Care Choice: NA Living arrangements for the past 2 months: Single Family Home Expected Discharge Date: 05/31/24                 DME Agency: NA       HH Arranged: NA  Prior Living Arrangements/Services Living arrangements for the past 2 months: Single Family Home Lives with:: Relatives Patient language and need for interpreter reviewed:: Yes        Need for Family Participation in Patient Care: No (Comment) Care giver support system in place?: No (comment)      Activities of Daily Living   ADL Screening (condition at time of admission) Independently performs ADLs?: Yes (appropriate for developmental age) Is the patient deaf or have difficulty hearing?: No Does the patient have difficulty seeing, even when wearing glasses/contacts?: No Does the patient have difficulty concentrating, remembering, or making decisions?: No  Permission  Sought/Granted Permission sought to share information with : Case Manager, Family Supports   Emotional Assessment Appearance:: Appears stated age Attitude/Demeanor/Rapport: Engaged Affect (typically observed): Appropriate Orientation: : Oriented to Self, Oriented to Place, Oriented to  Time, Oriented to Situation Alcohol / Substance Use: Not Applicable Psych Involvement: No (comment)  Admission diagnosis:  Palpitations [R00.2] Hypokalemia [E87.6] Near syncope [R55] Heart failure (HCC) [I50.9] Chest pain, unspecified type [R07.9] Patient Active Problem List   Diagnosis Date Noted   Hydradenitis 05/31/2024   Heart failure (HCC) 05/27/2024   Acute on chronic systolic CHF (congestive heart failure) (HCC) 05/27/2024   History of DVT (deep vein thrombosis) 05/27/2024   Coronary artery disease 05/27/2024   Obesity, class 1 05/27/2024   Nausea and vomiting 05/27/2024   Acute deep vein thrombosis (DVT) of left upper extremity (HCC) 04/02/2023   Anemia 03/12/2022   Nausea 03/12/2022   Hematoma of arm, left, subsequent encounter 03/12/2022   Hypomagnesemia 01/12/2022   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   Left arm pain and swelling 01/10/2022   Cardiomyopathy (HCC)    Paroxysmal atrial fibrillation (HCC) 01/06/2022   Endocarditis of prosthetic mitral valve    AKI (acute kidney injury)    Acute febrile illness    Anticoagulated on Coumadin     Dehydration    Elevated lactic acid level    Nausea vomiting and diarrhea    Atrial fibrillation with RVR (HCC) 08/03/2021   Sacral wound 08/03/2021   Pyoderma gangrenosum (HCC) 08/03/2021   Shock liver 08/03/2021   Upper  GI bleed 08/03/2021   Iron deficiency anemia 08/03/2021   Hyponatremia 08/03/2021   Cardiogenic shock (HCC)    Acute hypoxemic respiratory failure (HCC) 07/24/2021   Encounter for central line placement    H/O mitral valve replacement 06/20/2021   Encounter for therapeutic drug monitoring 06/20/2021   Chronic heart failure  (HCC) 05/30/2021   Acute respiratory failure with hypoxia (HCC) 05/30/2021   Hypokalemia 05/30/2021   Elevated brain natriuretic peptide (BNP) level 05/30/2021   Elevated troponin 05/30/2021   Elevated serum creatinine 05/30/2021   Hypoalbuminemia due to protein-calorie malnutrition 05/30/2021   Prolonged QT interval 05/30/2021   Transaminitis 05/30/2021   Paroxysmal atrial flutter (HCC) 05/30/2021   Acute systolic CHF (congestive heart failure) (HCC) 05/30/2021   Acute on chronic systolic and diastolic heart failure, NYHA class 3 (HCC) 05/30/2021   Caries    Chronic periodontitis    Retained dental root    Tricuspid regurgitation    Mitral regurgitation    Acute on chronic HFrEF (heart failure with reduced ejection fraction) (HCC)    Autoimmune disorder    Seronegative spondylitis    PCP:  Bucio, Elsa C, FNP Pharmacy:   [facility] [address] [place] [address] Phone: [phone] Fax: [phone]  [facility] [address] [place] - [address] [place] [address] Phone: [phone] Fax: [phone]     Social Drivers of Health (SDOH) Social History: SDOH Screenings   Food Insecurity: Unknown (05/28/2024)  Housing: Low Risk (05/28/2024)  Transportation Needs: No Transportation Needs (05/28/2024)  Utilities: Not At Risk (05/28/2024)  Tobacco Use: Medium Risk (05/26/2024)   SDOH Interventions:     Readmission Risk Interventions    05/31/2024   10:55 AM 01/06/2022    9:06 AM  Readmission Risk Prevention Plan  Transportation Screening Complete Complete  PCP or Specialist Appt within 3-5 Days Complete   HRI or Home Care Consult Complete   Social Work Consult for Recovery Care Planning/Counseling Complete   Palliative Care Screening Not Applicable   Medication Review Oceanographer) Referral to Pharmacy Complete  SW Recovery Care/Counseling Consult  Complete

## 2024-05-31 NOTE — Discharge Summary (Signed)
 Physician Discharge Summary   Patient: Donald [name] MRN: [id number] DOB: 01-01-1986  Admit date:     05/26/2024  Discharge date: 05/31/2024  Discharge Physician: Elidia Sieving Rewa Weissberg   PCP: Bucio, Elsa C, FNP   Recommendations at discharge:    Heart failure guideline directed medical therapy limited due to hypotension, at home will continue low dose losartan , spironolactone  and digoxin . No SGLT 2 inh due to hydradenitis and risk of perineal infections.  Furosemide  as needed for volume overload, weight gain 2 to 3 lbs in 24 hrs or 5 lbs in 7 days.  Placed on enoxaparin  anticoagulation bridge and follow up INR this week.  Added as needed metoclopramide  for nausea control.  Follow up renal function and electrolytes in 7 days as outpatient  Follow up with Elsa Bucio FNP in 7 to 10 days Follow up with Cardiology as scheduled.   Discharge Diagnoses: Principal Problem:   Acute on chronic systolic CHF (congestive heart failure) (HCC) Active Problems:   Coronary artery disease   Paroxysmal atrial fibrillation (HCC)   AKI (acute kidney injury)   History of DVT (deep vein thrombosis)   Nausea and vomiting   Obesity, class 1  Resolved Problems:   * No resolved hospital problems. [facility] Course: [name] was admitted to the hospital with the working diagnosis of atrial flutter with decompensated heart failure.   38 yo male with the past medical history of heart failure, mechanical mitral valve, deep vein thrombosis, coronary artery disease, and atrial flutter who presented with palpitations. Patient has been experiencing nausea and vomiting for the last 3 to 4 weeks, and has not been able to take his medications consistently  12/10 ED visit at [facility] for atrial flutter, he converted to sinus rhythm in the ED, he was discharged with an increased dose of amiodarone  200 mg bid.  At home he continue to have palpitations and chest discomfort prompting him to coma to the ED.  On his  initial physical examination his blood pressure was 105/88, HR 121 and 80, RR 22 and 02 saturation 90%  Lungs with no wheezing or rhonchi, heart with S1 and S2 present and regular, abdomen soft and not distended, no lower extremity edema.   Na 131, K 3.1 Cl 97 bicarbonate 25 glucose 127 bun6 cr 1,13  Mg 1,7  High sensitive troponin 27  Wbc 10,4 hgb 9,4 plt 459   Chest radiograph with cardiomegaly, bilateral hilar vascular congestion, with no effusions, sternotomy wires in place.   EKG 112 bpm, normal axis, qtc 540, sinus rhythm with PAC, q waves lead II, III, aVF, V5 to V6, with no significant ST segment or T wave changes.   12/14 patient with improved volume status, resumed on heart failure guideline directed medical therapy. If blood pressure stable plan for discharge home tomorrow.  12/15 subtherapeutic INR, placed on enoxaparin  for bridge.  12/16 INR is improving, patient will continue anticoagulation bridge with enoxaparin .  Follow up as outpatient.   Assessment and Plan: * Acute on chronic systolic CHF (congestive heart failure) (HCC) Echocardiogram with reduced LV systolic function EF 20 to 25%, global hypokinesis, moderate dilatation LV cavity, mild LVH, RV systolic function with moderate reduction, positive mechanical mitral valve, calcification of the aortic valve with no stenosis.   Patient was placed on IV furosemide  for diuresis, negative fluid balance was achieved with improvement in his symptoms.   Plan to continue spironolactone  and digoxin  Reduced dose of losartan  to 12.5 mg daily to avoid  hypotension.  Hold on SGLT 2 inh due to hydradenitis and risk of infection   Coronary artery disease Elevated high sensitive troponin due to heart failure exacerbation.  Has chronic infero lateral q waves, acute coronary syndrome has been ruled out.    Paroxysmal atrial fibrillation (HCC) Patient on sinus rhythm, with PAC Plan to continue amiodarone  at home dose of 200 mg po  daily.  Continue digoxin .   12/1 had 1 mg Vitamin K for supra therapeutic INR. On 12/15 INR down to 1,8, started on enoxaparin  for anticoagulation bridge.  On the day of discharge his INR is 2,0, plan to continue warfarin and enoxaparin  bridge until follow up INR therapeutic.   AKI (acute kidney injury) Hypokalemia  Hyponatremia.   At the time of discharge serum cr is 1,53 with K at 3,8 and serum bicarbonate at 24  Na 132   Continue with low dose spironolactone   Resume furosemide  as needed for signs of volume overload.   History of DVT (deep vein thrombosis) Continue anticoagulation with warfarin. Plan to continue enoxaparin  for anticoagulation bridging.   Chronic anemia, hgb today is 10.1, follow up as outpatient.  Reactive leukocytosis at 12.9, no signs of systemic infection, will continue to hold on antibiotic therapy and have follow up as outpatient.   Nausea and vomiting Symptoms have resolved, continue as needed metoclopramide .  Antiacid therapy with pantoprazole .   Hydradenitis Hydradenitis suppurative, followed by [facility] dermatology.  Wbc is elevated at 12.9, but no fevers and no signs of local infection Old records reviewed, he had isolated elevated wbc in the past.  Continue local care with Sulfacetamide sodium 9%, sulfur 3% foaming wash  Will plan for outpatient follow up.   Obesity, class 1 Calculated BMI 34.7       Consultants: none  Procedures performed: none   Disposition: Home Diet recommendation:  Cardiac diet DISCHARGE MEDICATION: Allergies as of 05/31/2024   No Known Allergies      Medication List     STOP taking these medications    gabapentin  300 MG capsule Commonly known as: NEURONTIN    metFORMIN 500 MG tablet Commonly known as: GLUCOPHAGE   potassium chloride  SA 20 MEQ tablet Commonly known as: KLOR-CON  M   predniSONE  10 MG tablet Commonly known as: DELTASONE    sacubitril -valsartan  24-26 MG Commonly known as: Entresto     sertraline  50 MG tablet Commonly known as: Zoloft    sildenafil  50 MG tablet Commonly known as: Viagra        TAKE these medications    amiodarone  200 MG tablet Commonly known as: PACERONE  Take 1 tablet (200 mg total) by mouth daily. What changed: when to take this   digoxin  0.125 MG tablet Commonly known as: LANOXIN  Take 1 tablet (0.125 mg total) by mouth daily.   enoxaparin  100 MG/ML injection Commonly known as: LOVENOX  Inject 1 mL (100 mg total) into the skin every 12 (twelve) hours.   furosemide  40 MG tablet Commonly known as: LASIX  Take 1 tablet (40 mg total) by mouth daily as needed for edema or fluid (as needed for weight gain 2 to 3 lbs in 24 hrs or 5 lbs in 7 days). What changed:  medication strength how much to take when to take this reasons to take this   losartan  25 MG tablet Commonly known as: COZAAR  Take 0.5 tablets (12.5 mg total) by mouth daily. Start taking on: June 01, 2024   metoCLOPramide  5 MG tablet Commonly known as: REGLAN  Take 1 tablet (5 mg total) by mouth  3 (three) times daily between meals as needed for nausea.   pantoprazole  40 MG tablet Commonly known as: PROTONIX  Take 1 tablet (40 mg total) by mouth daily.   spironolactone  25 MG tablet Commonly known as: Aldactone  Take 0.5 tablets (12.5 mg total) by mouth daily.   ustekinumab 45 MG/0.5ML injection Commonly known as: STELARA Inject 45 mg into the skin. Inject 45 mg (0.5 ml) subcutaneously at week 0, 4 and every 12 weeks thereafter.   warfarin 5 MG tablet Commonly known as: COUMADIN  Take as directed. If you are unsure how to take this medication, talk to your nurse or doctor. Original instructions: TAKE 1/2 TO 1  TABLET BY MOUTH ONCE DAILY OR AS DIRECTED BY COUMADIN  CLINIC What changed:  how much to take how to take this when to take this additional instructions               Discharge Care Instructions  (From admission, onward)           Start     Ordered    05/31/24 0000  Discharge wound care:       Comments: Wound care  2 times daily      Comments: Cleanse B  buttocks with Hibiclens  twice daily and apply ABD pads to absorb drainage. Change ABD pads as needed for saturation   05/31/24 1007            Discharge Exam: Filed Weights   05/27/24 1952  Weight: 106.7 kg   BP (!) 99/56 (BP Location: Right Arm)   Pulse 85   Temp 97.8 F (36.6 C) (Oral)   Resp 18   Ht 5' 9 (1.753 m)   Wt 106.7 kg   SpO2 95%   BMI 34.73 kg/m   Patient is feeling better, no chest pain and no dyspnea, no palpitations, no PND, orthopnea or lower extremity edema  Neurology awake and alert ENT with mild pallor with no icterus Cardiovascular with S1 and S2 present and regular with no gallops, rubs or murmurs Respiratory with no rales or wheezing, no rhonchi  Abdomen with no distention  No lower extremity edema   Condition at discharge: stable  The results of significant diagnostics from this hospitalization (including imaging, microbiology, ancillary and laboratory) are listed below for reference.   Imaging Studies: DG CHEST PORT 1 VIEW Result Date: 05/27/2024 CLINICAL DATA:  Sepsis chest pain EXAM: PORTABLE CHEST 1 VIEW COMPARISON:  05/26/2024, CT chest 04/02/2023 FINDINGS: Sternotomy and valve prosthesis. Cardiomegaly. No acute airspace disease, pleural effusion or pneumothorax. IMPRESSION: Cardiomegaly. No active disease. Electronically Signed   By: Luke Bun M.D.   On: 05/27/2024 22:59   ECHOCARDIOGRAM COMPLETE Result Date: 05/27/2024    ECHOCARDIOGRAM REPORT   Patient Name:   KALLON CAYLOR Date of Exam: 05/27/2024 Medical Rec #:  [id number]           Height:       69.0 in Accession #:    [id number]          Weight:       235.0 lb Date of Birth:  October 09, 1985          BSA:          2.213 m Patient Age:    38 years            BP:           98/82 mmHg Patient Gender: M  HR:           74 bpm. Exam Location:  Inpatient  Procedure: 2D Echo, Cardiac Doppler, Color Doppler and Intracardiac            Opacification Agent (Both Spectral and Color Flow Doppler were            utilized during procedure). Indications:    Chest Pain R07.9  History:        Patient has prior history of Echocardiogram examinations, most                 recent 02/26/2023. CHF; CAD.  Sonographer:    Tinnie Gosling RDCS Referring Phys: [id number] ROBERT DORRELL IMPRESSIONS  1. Left ventricular ejection fraction, by estimation, is 20 to 25%. The left ventricle has severely decreased function. The left ventricle demonstrates global hypokinesis. The left ventricular internal cavity size was moderately dilated. There is mild concentric left ventricular hypertrophy. Left ventricular diastolic parameters are indeterminate.  2. Right ventricular systolic function is moderately reduced. The right ventricular size is normal. Tricuspid regurgitation signal is inadequate for assessing PA pressure.  3. There is a mechanical mitral valve present. Mean gradient 7 mmHg with average HR 77 bpm. Mean gradient 5 on prior study. The mechanical valve is not well-visualized but looked similar on prior study. Minimal regurgitation.  4. The aortic valve is tricuspid. There is mild calcification of the aortic valve. Aortic valve regurgitation is trivial.  5. The inferior vena cava is normal in size with greater than 50% respiratory variability, suggesting right atrial pressure of 3 mmHg. FINDINGS  Left Ventricle: Left ventricular ejection fraction, by estimation, is 20 to 25%. The left ventricle has severely decreased function. The left ventricle demonstrates global hypokinesis. The left ventricular internal cavity size was moderately dilated. There is mild concentric left ventricular hypertrophy. Left ventricular diastolic parameters are indeterminate. Right Ventricle: The right ventricular size is normal. No increase in right ventricular wall thickness. Right ventricular systolic function  is moderately reduced. Tricuspid regurgitation signal is inadequate for assessing PA pressure. Left Atrium: Left atrial size was not well visualized. Right Atrium: Right atrial size was normal in size. Pericardium: There is no evidence of pericardial effusion. Mitral Valve: There is a mechanical mitral valve present. Mean gradient 7 mmHg with average HR 77 bpm. Mean gradient 5 on prior study. The mechanical valve is not well-visualized but looked similar on prior study. Minimal regurgitation. MV peak gradient,  10.4 mmHg. The mean mitral valve gradient is 7.0 mmHg. Tricuspid Valve: The tricuspid valve is normal in structure. Tricuspid valve regurgitation is not demonstrated. Aortic Valve: The aortic valve is tricuspid. There is mild calcification of the aortic valve. Aortic valve regurgitation is trivial. Pulmonic Valve: The pulmonic valve was normal in structure. Pulmonic valve regurgitation is trivial. Aorta: The aortic root is normal in size and structure. Venous: The inferior vena cava is normal in size with greater than 50% respiratory variability, suggesting right atrial pressure of 3 mmHg. IAS/Shunts: The interatrial septum was not well visualized.  LEFT VENTRICLE PLAX 2D LVIDd:         6.70 cm      Diastology LVIDs:         5.80 cm      LV e' medial:  4.57 cm/s LV PW:         1.30 cm      LV e' lateral: 6.64 cm/s LV IVS:        1.30 cm LVOT diam:  2.50 cm LVOT Area:     4.91 cm  LV Volumes (MOD) LV vol d, MOD A4C: 332.0 ml LV vol s, MOD A4C: 254.0 ml LV SV MOD A4C:     332.0 ml RIGHT VENTRICLE            IVC RV S prime:     7.62 cm/s  IVC diam: 1.20 cm TAPSE (M-mode): 1.2 cm LEFT ATRIUM           Index LA diam:      5.00 cm 2.26 cm/m LA Vol (A4C): 61.5 ml 27.80 ml/m   AORTA Ao Root diam: 3.40 cm Ao Asc diam:  3.70 cm MITRAL VALVE MV Peak grad: 10.4 mmHg  SHUNTS MV Mean grad: 7.0 mmHg   Systemic Diam: 2.50 cm MV Vmax:      1.61 m/s MV Vmean:     131.0 cm/s Dalton McleanMD Electronically signed by Ezra Kanner Signature Date/Time: 05/27/2024/1:43:40 PM    Final    DG Chest 2 View Result Date: 05/26/2024 CLINICAL DATA:  Chest pain. EXAM: CHEST - 2 VIEW COMPARISON:  Chest radiograph dated 05/25/2024. FINDINGS: Cardiomegaly with vascular congestion. No focal consolidation, pleural effusion or pneumothorax. Median sternotomy wires and mechanical cardiac valve. No acute osseous pathology. IMPRESSION: Cardiomegaly with vascular congestion. No focal consolidation. Electronically Signed   By: Vanetta Chou M.D.   On: 05/26/2024 20:45   DG Chest Portable 1 View Result Date: 05/25/2024 CLINICAL DATA:  Chest pain.  Tachycardia.  Atrial fibrillation. EXAM: PORTABLE CHEST 1 VIEW COMPARISON:  04/05/2022 FINDINGS: Stable cardiomegaly. Previous mitral valve replacement. Both lungs are clear. IMPRESSION: Stable cardiomegaly. No active lung disease. Electronically Signed   By: Norleen DELENA Kil M.D.   On: 05/25/2024 10:02    Microbiology: Results for orders placed or performed during the hospital encounter of 05/26/24  Blood culture (routine x 2)     Status: None (Preliminary result)   Collection Time: 05/26/24 11:52 PM   Specimen: BLOOD  Result Value Ref Range Status   Specimen Description BLOOD RIGHT ANTECUBITAL  Final   Special Requests   Final    BOTTLES DRAWN AEROBIC AND ANAEROBIC Blood Culture results may not be optimal due to an inadequate volume of blood received in culture bottles   Culture   Final    NO GROWTH 3 DAYS Performed at [facility], [address], [place], [place] [address]    Report Status PENDING  Incomplete  Blood culture (routine x 2)     Status: None (Preliminary result)   Collection Time: 05/26/24 11:57 PM   Specimen: BLOOD  Result Value Ref Range Status   Specimen Description BLOOD BLOOD RIGHT ARM  Final   Special Requests   Final    BOTTLES DRAWN AEROBIC AND ANAEROBIC Blood Culture results may not be optimal due to an inadequate volume of blood received in culture  bottles   Culture   Final    NO GROWTH 3 DAYS Performed at [facility], [address], [place], [place] [address]    Report Status PENDING  Incomplete    Labs: CBC: Recent Labs  Lab 05/25/24 0920 05/26/24 2001 05/27/24 2344 05/29/24 0900 05/30/24 0600  WBC 9.6 10.4 12.2* 10.8* 12.9*  NEUTROABS 7.5  --   --   --   --   HGB 9.2* 9.4* 9.7* 9.9* 10.0*  HCT 30.1* 29.9* 30.3* 31.3* 32.1*  MCV 77.0* 76.3* 76.1* 77.3* 77.7*  PLT 462* 459* 417* 356 373  Basic Metabolic Panel: Recent Labs  Lab 05/25/24 0920 05/25/24 1510 05/26/24 2001 05/27/24 0355 05/27/24 2344 05/28/24 0345 05/29/24 0531 05/30/24 0600  NA 137   < > 131* 134* 131* 132* 133* 132*  K 2.8*   < > 3.1* 4.0 3.0* 3.1* 3.9 3.8  CL 100   < > 97* 102 96* 97* 97* 99  CO2 25   < > 25 21* 29 28 27 24   GLUCOSE 92   < > 127* 83 148* 119* 94 104*  BUN 12   < > 6 5* 6 5* 6 9  CREATININE 1.17   < > 1.13 1.06 1.60* 1.58* 1.38* 1.53*  CALCIUM  8.2*   < > 8.1* 7.7* 7.7* 7.4* 8.0* 8.1*  MG 1.5*  --  1.7  --  1.9  --  1.8  --    < > = values in this interval not displayed.   Liver Function Tests: Recent Labs  Lab 05/27/24 0355  AST 20  ALT 9  ALKPHOS 77  BILITOT 1.1  PROT 7.8  ALBUMIN 1.9*   CBG: Recent Labs  Lab 05/25/24 0950  GLUCAP 97    Discharge time spent: greater than 30 minutes.  Signed: Elidia Toribio Furnace, MD [facility] 05/31/2024

## 2024-05-31 NOTE — Plan of Care (Signed)
   Problem: Clinical Measurements: Goal: Ability to maintain clinical measurements within normal limits will improve Outcome: Progressing Goal: Will remain free from infection Outcome: Progressing Goal: Respiratory complications will improve Outcome: Progressing   Problem: Activity: Goal: Risk for activity intolerance will decrease Outcome: Progressing   Problem: Nutrition: Goal: Adequate nutrition will be maintained Outcome: Progressing

## 2024-05-31 NOTE — Assessment & Plan Note (Addendum)
 Hydradenitis suppurative, followed by [facility] dermatology.  Wbc is elevated at 12.9, but no fevers and no signs of local infection Old records reviewed, he had isolated elevated wbc in the past.  Continue local care with Sulfacetamide sodium 9%, sulfur 3% foaming wash  Will plan for outpatient follow up.

## 2024-05-31 NOTE — Progress Notes (Signed)
 PHARMACY - ANTICOAGULATION CONSULT NOTE  Pharmacy Consult for Coumadin  Indication: mechanical valve  Allergies[1]  Patient Measurements: Height: 5' 9 (175.3 cm) Weight: 106.7 kg (235 lb 3.2 oz) IBW/kg (Calculated) : 70.7 HEPARIN  DW (KG): 93.9  Vital Signs: Temp: 97.8 F (36.6 C) (12/16 0816) Temp Source: Oral (12/16 0816) BP: 99/56 (12/16 0816) Pulse Rate: 85 (12/16 0816)  Labs: Recent Labs    05/29/24 0531 05/29/24 0900 05/30/24 0600 05/31/24 0452  HGB  --  9.9* 10.0*  --   HCT  --  31.3* 32.1*  --   PLT  --  356 373  --   LABPROT  --  23.8* 22.2* 23.7*  INR  --  2.0* 1.8* 2.0*  CREATININE 1.38*  --  1.53*  --     Estimated Creatinine Clearance: 80.3 mL/min (A) (by C-G formula based on SCr of 1.53 mg/dL (H)).   Medical History: Past Medical History:  Diagnosis Date   Anemia    Autoimmune disorder    pyoderma gangrenosum   CHF (congestive heart failure) (HCC)    Chronic systolic heart failure (HCC)    a. EF 35-40% by echo in 07/2018 b. EF at 45% by repeat echo in 04/2020   DVT (deep venous thrombosis) (HCC)    h/o   Dysrhythmia    Hidradenitis suppurativa    Mitral regurgitation    a. 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 b. 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   Mitral stenosis    Myocardial infarction (HCC)    Paroxysmal atrial flutter (HCC)    Pyoderma gangrenosa (HCC)    Seronegative spondylitis    arthritis   Shock, septic and cardiogenic 12/28/2021   Tricuspid regurgitation     Medications:  Medications Ordered Prior to Encounter[2]   Assessment: 38 y.o. male admitted with CHF, h/o mechanical mitral valve, to continue Coumadin .  Current home regimen Coumadin  5 mg MWF and 2.5 mg TTSS.   INR supratherapeutic on admit   -INR= 1.8> 2.0 (warfarin restarted 12/14) -Plans to start lovenox  today (SCr 1.5, CrCl ~ 80)  Goal of Therapy:  INR  2.5-3.5 Monitor platelets by anticoagulation protocol: Yes   Plan:  Give warfarin 2.5 mg today.  Daily INR  Lovenox  100mg  sq q12h  Prentice Poisson, PharmD Clinical Pharmacist **Pharmacist phone directory can now be found on [email or web address] ([email or web address]).  Listed under [facility].         [1] No Known Allergies [2]  No current facility-administered medications on file prior to encounter.   Current Outpatient Medications on File Prior to Encounter  Medication Sig Dispense Refill   amiodarone  (PACERONE ) 200 MG tablet Take 1 tablet (200 mg total) by mouth 2 (two) times daily. 30 tablet 6   digoxin  (LANOXIN ) 0.125 MG tablet Take 1 tablet (0.125 mg total) by mouth daily. 90 tablet 3   furosemide  (LASIX ) 80 MG tablet Take 1 tablet (80 mg total) by mouth daily. 90 tablet 1   gabapentin  (NEURONTIN ) 300 MG capsule Take 2 capsules by mouth twice daily 120 capsule 0   metFORMIN (GLUCOPHAGE) 500 MG tablet Take 500 mg by mouth daily.     pantoprazole  (PROTONIX ) 40 MG tablet Take 1 tablet (40 mg total) by mouth daily. 30 tablet 8   potassium chloride  SA (KLOR-CON  M) 20 MEQ tablet Take 1 tablet (20 mEq total) by mouth daily. (Patient taking differently: Take 40 mEq by mouth 2 (two)  times daily.) 30 tablet 6   predniSONE  (DELTASONE ) 10 MG tablet Take 10 mg by mouth daily.     sacubitril -valsartan  (ENTRESTO ) 24-26 MG Take 1 tablet by mouth 2 (two) times daily. 180 tablet 1   spironolactone  (ALDACTONE ) 25 MG tablet Take 0.5 tablets (12.5 mg total) by mouth daily. 90 tablet 1   ustekinumab (STELARA) 45 MG/0.5ML injection Inject 45 mg into the skin. Inject 45 mg (0.5 ml) subcutaneously at week 0, 4 and every 12 weeks thereafter.     warfarin (COUMADIN ) 5 MG tablet TAKE 1/2 TO 1  TABLET BY MOUTH ONCE DAILY OR AS DIRECTED BY COUMADIN  CLINIC (Patient taking differently: Take 5 mg by mouth daily.) 45 tablet 5   sertraline  (ZOLOFT ) 50 MG tablet Take 1 tablet (50 mg total) by mouth daily. (Patient not taking: Reported  on 05/27/2024) 60 tablet 4   sildenafil  (VIAGRA ) 50 MG tablet Take 1 tablet (50 mg total) by mouth daily as needed for erectile dysfunction. (Patient not taking: Reported on 05/27/2024) 10 tablet 1

## 2024-06-01 LAB — CULTURE, BLOOD (ROUTINE X 2)
Culture: NO GROWTH
Culture: NO GROWTH

## 2024-06-02 ENCOUNTER — Inpatient Hospital Stay: Admitting: *Deleted

## 2024-06-02 DIAGNOSIS — I480 Paroxysmal atrial fibrillation: Secondary | ICD-10-CM

## 2024-06-02 DIAGNOSIS — Z5181 Encounter for therapeutic drug level monitoring: Secondary | ICD-10-CM

## 2024-06-02 DIAGNOSIS — Z952 Presence of prosthetic heart valve: Secondary | ICD-10-CM

## 2024-06-02 DIAGNOSIS — I6349 Cerebral infarction due to embolism of other cerebral artery: Secondary | ICD-10-CM

## 2024-06-02 LAB — POCT INR: INR: 2.3 (ref 2.0–3.0)

## 2024-06-02 NOTE — Patient Instructions (Signed)
 Take warfarin 1 tablet tonight then change dose to 1/2 tablet daily except 1 tablet on Tuesdays and Fridays Take Lovenox  2 times today and tomorrow morning then stop. Recheck in 1 wk

## 2024-06-02 NOTE — Progress Notes (Signed)
 INR-2.3; Please see anticoagulation encounter

## 2024-06-03 ENCOUNTER — Telehealth (HOSPITAL_COMMUNITY): Payer: Self-pay | Admitting: Cardiology

## 2024-06-03 NOTE — Telephone Encounter (Signed)
 Patient called to report increase in weakness and V/D. Reports these were the same symptoms as what brought him in the hospital  Reports he was discharged 12/17, has noticed increase in fatigue. Reports still vomiting and loose stools despite medications given at discharge   Denies CP SOB or swelling Denies fever or nausea   Reports he is still taking medications however vomits multiple times per day   Please advise

## 2024-06-03 NOTE — Telephone Encounter (Signed)
 Patient advised and verbalized understanding

## 2024-06-07 ENCOUNTER — Ambulatory Visit

## 2024-06-08 ENCOUNTER — Ambulatory Visit: Attending: Cardiology | Admitting: *Deleted

## 2024-06-08 DIAGNOSIS — Z5181 Encounter for therapeutic drug level monitoring: Secondary | ICD-10-CM | POA: Diagnosis not present

## 2024-06-08 DIAGNOSIS — Z952 Presence of prosthetic heart valve: Secondary | ICD-10-CM | POA: Insufficient documentation

## 2024-06-08 DIAGNOSIS — I6349 Cerebral infarction due to embolism of other cerebral artery: Secondary | ICD-10-CM | POA: Insufficient documentation

## 2024-06-08 DIAGNOSIS — I480 Paroxysmal atrial fibrillation: Secondary | ICD-10-CM | POA: Insufficient documentation

## 2024-06-08 LAB — POCT INR: INR: 4.2 — AB (ref 2.0–3.0)

## 2024-06-08 NOTE — Progress Notes (Signed)
 INR 4.2. Please see anticoagulation encounter

## 2024-06-08 NOTE — Patient Instructions (Signed)
 Hold warfarin tonight then decrease dose to 1/2 tablet daily except 1 tablet on Fridays Take Lovenox  2 times today and tomorrow morning then stop. Recheck in 2 wk

## 2024-06-10 ENCOUNTER — Ambulatory Visit (HOSPITAL_COMMUNITY)
Admission: RE | Admit: 2024-06-10 | Discharge: 2024-06-10 | Disposition: A | Discharge status: Home / Self Care | Source: Ambulatory Visit | Attending: Internal Medicine | Admitting: Internal Medicine

## 2024-06-10 ENCOUNTER — Ambulatory Visit (HOSPITAL_COMMUNITY): Payer: Self-pay | Admitting: Internal Medicine

## 2024-06-10 ENCOUNTER — Encounter (HOSPITAL_COMMUNITY): Payer: Self-pay

## 2024-06-10 VITALS — BP 108/81 | HR 76

## 2024-06-10 DIAGNOSIS — Z79899 Other long term (current) drug therapy: Secondary | ICD-10-CM | POA: Diagnosis not present

## 2024-06-10 DIAGNOSIS — I5082 Biventricular heart failure: Secondary | ICD-10-CM | POA: Diagnosis present

## 2024-06-10 DIAGNOSIS — Z8673 Personal history of transient ischemic attack (TIA), and cerebral infarction without residual deficits: Secondary | ICD-10-CM | POA: Diagnosis not present

## 2024-06-10 DIAGNOSIS — I459 Conduction disorder, unspecified: Secondary | ICD-10-CM | POA: Diagnosis not present

## 2024-06-10 DIAGNOSIS — L732 Hidradenitis suppurativa: Secondary | ICD-10-CM | POA: Insufficient documentation

## 2024-06-10 DIAGNOSIS — I69334 Monoplegia of upper limb following cerebral infarction affecting left non-dominant side: Secondary | ICD-10-CM | POA: Diagnosis not present

## 2024-06-10 DIAGNOSIS — M4690 Unspecified inflammatory spondylopathy, site unspecified: Secondary | ICD-10-CM | POA: Diagnosis not present

## 2024-06-10 DIAGNOSIS — Z87891 Personal history of nicotine dependence: Secondary | ICD-10-CM | POA: Insufficient documentation

## 2024-06-10 DIAGNOSIS — Z7982 Long term (current) use of aspirin: Secondary | ICD-10-CM | POA: Diagnosis not present

## 2024-06-10 DIAGNOSIS — L88 Pyoderma gangrenosum: Secondary | ICD-10-CM | POA: Diagnosis not present

## 2024-06-10 DIAGNOSIS — Z952 Presence of prosthetic heart valve: Secondary | ICD-10-CM | POA: Insufficient documentation

## 2024-06-10 DIAGNOSIS — I480 Paroxysmal atrial fibrillation: Secondary | ICD-10-CM | POA: Diagnosis not present

## 2024-06-10 DIAGNOSIS — D8989 Other specified disorders involving the immune mechanism, not elsewhere classified: Secondary | ICD-10-CM | POA: Insufficient documentation

## 2024-06-10 DIAGNOSIS — Z7901 Long term (current) use of anticoagulants: Secondary | ICD-10-CM | POA: Insufficient documentation

## 2024-06-10 DIAGNOSIS — I5022 Chronic systolic (congestive) heart failure: Secondary | ICD-10-CM | POA: Insufficient documentation

## 2024-06-10 LAB — BASIC METABOLIC PANEL WITH GFR
Anion gap: 10 (ref 5–15)
BUN: 15 mg/dL (ref 6–20)
CO2: 23 mmol/L (ref 22–32)
Calcium: 8.7 mg/dL — ABNORMAL LOW (ref 8.9–10.3)
Chloride: 103 mmol/L (ref 98–111)
Creatinine, Ser: 1.1 mg/dL (ref 0.61–1.24)
GFR, Estimated: >60 mL/min
Glucose, Bld: 92 mg/dL (ref 70–99)
Potassium: 3.4 mmol/L — ABNORMAL LOW (ref 3.5–5.1)
Sodium: 135 mmol/L (ref 135–145)

## 2024-06-10 LAB — PRO BRAIN NATRIURETIC PEPTIDE: Pro Brain Natriuretic Peptide: 10695 pg/mL — ABNORMAL HIGH

## 2024-06-10 NOTE — Patient Instructions (Signed)
 Labs done today, your results will be available in MyChart, we will contact you for abnormal readings.  Follow-Up in: 2 months with Dr. Bensimhon as scheduled   At the [facility], you and your health needs are our priority. We have a designated team specialized in the treatment of Heart Failure. This Care Team includes your primary Heart Failure Specialized Cardiologist (physician), Advanced Practice Providers (APPs- Physician Assistants and Nurse Practitioners), and Pharmacist who all work together to provide you with the care you need, when you need it.   You may see any of the following providers on your designated Care Team at your next follow up:  Dr. Toribio Fuel Dr. Ezra Shuck Dr. Odis Brownie Greig Mosses, NP Caffie Shed, [place] [facility] [place], [place] Beckey Coe, NP Jordan Lee, NP Tinnie Redman, PharmD   Please be sure to bring in all your medications bottles to every appointment.   Need to Contact Us :  If you have any questions or concerns before your next appointment please send us  a message through [place] or call our office at [phone].    TO LEAVE A MESSAGE FOR THE NURSE SELECT OPTION 2, PLEASE LEAVE A MESSAGE INCLUDING: YOUR NAME DATE OF BIRTH CALL BACK NUMBER REASON FOR CALL**this is important as we prioritize the call backs  YOU WILL RECEIVE A CALL BACK THE SAME DAY AS LONG AS YOU CALL BEFORE 4:00 PM

## 2024-06-10 NOTE — Progress Notes (Signed)
"   ReDS Vest / Clip - 06/10/24 1447       ReDS Vest / Clip   Station Marker D    Ruler Value 32    ReDS Value Range Low volume    ReDS Actual Value 31           "

## 2024-06-10 NOTE — Progress Notes (Signed)
 "  [facility] Note   PCP: Bucio, Silvio BROCKS, FNP Primary Cardiologist: Alvan Carrier, MD  HF Cardiologist: Dr. Cherrie   HPI: Donald [name] is a 38 y.o. male with history of mitral regurgitation (s/p MV repair with resection of ruptured anterior papillary muscle and reconstruction of papillary chord and placement of annuloplasty ring in 2019). Had recurrent MR and underwent MVR w/ mechanical valve on 06/10/21 by Dr. Marilynne at [facility].   Admitted 12/22 for a/c CHF and evidence of low output and AKI. Echo with EF 20-25% with severe MR/mod MS and severe RV dysfunction. PICC place, started on empiric milrinone . AKI resolved with addition of milrinone . He underwent MVR w/ mechanical valve on 12/26 by Dr. Marilynne at [facility].  Admitted 2/23 with low output HF in setting of AF. Echo showed LVEF <20%, RV severely reduced. Mechanical MV ok, Gradient 3 mmHg. Started on milrinone  and amio gtt. Diuresed w/ IV Lasix . Diuresed and extubated. Converted to NSR, amio and milrinone  weaned off. GDMT titrated. Discharged home, weight was 198 lb.   Admitted 7/23 with cardiogenic shock. Echo EF 15%, RV sev HK, MVR thick, mean gradient 5-6. Intubated. TEE with severe biventricular failure with vegetation on mechanical MVR. Had CODE stroke. MRI w/ several small acute right MCA territory ischemic strokes, suggestive of a cardioembolic etiology.  Had upper extremity swelling. 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  Ortho consulted and rec transfer to [facility] for Ortho Onc. While at [facility], UE mass found to be hematoma. Kidney function declined and he was transiently on hemodialysis.   Seen in ED 04/05/22 with palpitations. Found to be in SVT vs AFL, and hypotensive. Underwent emergent DCCV to NSR, BP improved.  Presented to [facility] ED with atrial flutter. Converted to NSR in the ED. Discharged on higher amiodarone  dose.   Admitted 12/25 with A/C HFrEF. Diuresed well with IV lasix . GDMT adjusted with soft  BP.   Today he returns for post hospital follow up. Overall feeling ok. Denies palpitations, CP, dizziness, edema, or PND/Orthopnea. No SOB. Appetite ok, watches what he eats. No fever or chills. Weight at home 231 pounds. Taking all medications. Denies ETOH, tobacco or drug use. Takes lasix  PRN, took yesterday for some swelling in his leg. Stays active by taking care of his 36 year old.   Cardiac Studies: - Echo 12/25: EF 20-25%, LV mod dilated, mild concentric LVH, RV mod reduced, mechanical MVR, minimal MR.  - Echo 2/25: 25-30% RV mod to severely down MV ok   - Echo (12/23) EF 25-30%, RV severely reduced - TEE (7/23): LVEF < 20%, RV severely reduced, mean mitral valve gradient 8 mmHg with trivial MR + vegetation on MV - Echo (7/23): EF < 15%, RV severely dow - Echo (4/23): EF 25%, mild RV dysfunction - Echo (2/23): EF < 20%, severe LV dysfunction with global HK, grade II DD, RV severely reduced, S/p mitral valve repair. MV mean gradient 3 mmHg  - Echo (12/22): EF 20-25%, severe LV dysfunction with global HK, mild LVH, RV  moderately reduced, elevated MV gradient , mild AI - R/LHC (09/11/20):  RA = 7 RV = 49/10 PA = 48/16 (32) PCW = 17 (v=32) Fick cardiac output/index = 5.3/2.5 PVR = 2.9 WU Ao sat = 99% PA sat = 71%, 70% High SVC sat =  75% 1. Normal coronary arteries 2. NICM EF 30-35% 3. Severe MR with prominent v-waves in PCWP tracing 4. Mild pulmonary venous HTN with normal CO  Past  Medical History:  Diagnosis Date   Anemia    Autoimmune disorder    pyoderma gangrenosum   CHF (congestive heart failure) (HCC)    Chronic systolic heart failure (HCC)    a. EF 35-40% by echo in 07/2018 b. EF at 45% by repeat echo in 04/2020   DVT (deep venous thrombosis) (HCC)    h/o   Dysrhythmia    Hidradenitis suppurativa    Mitral regurgitation    a. s/p MV repair with resection of ruptured anterior papillary muscle and reconstruction of papillary chord and placement of annuloplasty  ring in 2019. b. 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   Mitral stenosis    Myocardial infarction (HCC)    Paroxysmal atrial flutter (HCC)    Pyoderma gangrenosa (HCC)    Seronegative spondylitis    arthritis   Shock, septic and cardiogenic 12/28/2021   Tricuspid regurgitation    Current Outpatient Medications  Medication Sig Dispense Refill   amiodarone  (PACERONE ) 200 MG tablet Take 1 tablet (200 mg total) by mouth daily.     digoxin  (LANOXIN ) 0.125 MG tablet Take 1 tablet (0.125 mg total) by mouth daily. 30 tablet 0   furosemide  (LASIX ) 40 MG tablet Take 1 tablet (40 mg total) by mouth daily as needed for edema or fluid (as needed for weight gain 2 to 3 lbs in 24 hrs or 5 lbs in 7 days). 30 tablet 0   losartan  (COZAAR ) 25 MG tablet Take 0.5 tablets (12.5 mg total) by mouth daily. (Patient taking differently: Take 25 mg by mouth daily.) 15 tablet 0   metoCLOPramide  (REGLAN ) 5 MG tablet Take 1 tablet (5 mg total) by mouth 3 (three) times daily between meals as needed for nausea. 90 tablet 0   pantoprazole  (PROTONIX ) 40 MG tablet Take 1 tablet (40 mg total) by mouth daily. 30 tablet 8   spironolactone  (ALDACTONE ) 25 MG tablet Take 0.5 tablets (12.5 mg total) by mouth daily. 30 tablet 0   ustekinumab (STELARA) 45 MG/0.5ML injection Inject 45 mg into the skin. Inject 45 mg (0.5 ml) subcutaneously at week 0, 4 and every 12 weeks thereafter.     warfarin (COUMADIN ) 5 MG tablet TAKE 1/2 TO 1  TABLET BY MOUTH ONCE DAILY OR AS DIRECTED BY COUMADIN  CLINIC 45 tablet 5   enoxaparin  (LOVENOX ) 100 MG/ML injection Inject 1 mL (100 mg total) into the skin every 12 (twelve) hours. (Patient not taking: Reported on 06/10/2024) 10 mL 0   No current facility-administered medications for this encounter.   No Known Allergies  Social History   Socioeconomic History   Marital status: Single    Spouse name: Not on file   Number of children: Not on file   Years of education: Not on file   Highest education level: Not on  file  Occupational History   Not on file  Tobacco Use   Smoking status: Former    Current packs/day: 0.00    Types: Cigarettes    Quit date: 10/14/2020    Years since quitting: 3.6   Smokeless tobacco: Never  Vaping Use   Vaping status: Never Used  Substance and Sexual Activity   Alcohol use: No   Drug use: No   Sexual activity: Yes  Other Topics Concern   Not on file  Social History Narrative   Left handed       Are you currently employed ? no   What is your current occupation?   Do you live at home alone?no   Who  lives with you? son   What type of home do you live in: 1 story or 2 story? one   Caffeine no    Social Drivers of Health   Tobacco Use: Medium Risk (05/26/2024)   Patient History    Smoking Tobacco Use: Former    Smokeless Tobacco Use: Never    Passive Exposure: Not on Actuary Strain: Not on file  Food Insecurity: Unknown (05/28/2024)   Epic    Worried About Programme Researcher, Broadcasting/film/video in the Last Year: Never true    [employer] of Food in the Last Year: Not on file  Transportation Needs: No Transportation Needs (05/28/2024)   Epic    Lack of Transportation (Medical): No    Lack of Transportation (Non-Medical): No  Physical Activity: Not on file  Stress: Not on file  Social Connections: Not on file  Intimate Partner Violence: Not At Risk (05/28/2024)   Epic    Fear of Current or Ex-Partner: No    Emotionally Abused: No    Physically Abused: No    Sexually Abused: No  Depression (PHQ2-9): Not on file  Alcohol Screen: Not on file  Housing: Low Risk (05/28/2024)   Epic    Unable to Pay for Housing in the Last Year: No    Number of Times Moved in the Last Year: 0    Homeless in the Last Year: No  Utilities: Not At Risk (05/28/2024)   Epic    Threatened with loss of utilities: No  Health Literacy: Not on file   Family History  Problem Relation Age of Onset   Multiple sclerosis Mother    Psoriasis Mother    Depression Father    Diabetes  Father    Diabetes Paternal Grandmother    BP 108/81 (BP Location: Right Arm, Patient Position: Sitting, Cuff Size: Normal)   Pulse 76   SpO2 99%   Wt Readings from Last 3 Encounters:  05/27/24 106.7 kg (235 lb 3.2 oz)  05/25/24 106.6 kg (235 lb)  03/02/24 117.5 kg (259 lb)   PHYSICAL EXAM: General:  well appearing.  No respiratory difficulty. Walked into clinic.  Neck: JVD ~6 cm.  Cor: Regular rate & rhythm. +mechanical valve. Lungs: clear Extremities: no edema  Neuro: alert & oriented x 3. Affect pleasant.   ECG (personally reviewed): NSR 70 bpm,  QTc 483 msec  ReDs reading: 31 %, normal   ASSESSMENT & PLAN:  1. Chronic HFrEF/Biventricular Failure - Echo (4/23): EF of 20-25% and RV function was severely reduced.  - Echo (7/23): EF 10% RV severely HK. Cardiogenic/septic shock, required NE and milrinone . - TEE (7/23) showed severe BiV failure + vegetation on mechanical MVR. - Echo (12/23) EF 25-30%, RV severely reduced - CPX 5/24 PFTs with moderate restriction pVO2 14.2 (40%) when adjusted to ibw 17.3 Slope 32 RER 1.10  - Echo 07/23/23 EF 25-30% MVR stable RV moderately decreased - Echo 12/25: EF 20-25%, LV mod dilated, mild concentric LVH, RV mod reduced, mechanical MVR, minimal MR.  - Doing well. NYHA II volume stable.  - Continue digoxin  0.125 mg daily. Digoxin  level <0.6 05/25/24 - Has been off Coreg  x months.  - Continue Lasix  40 mg daily PRN  (previously on 80 mg daily) He's good about weighting and taking PRN. So far stable. I asked him to reach out if fluid was getting difficult to manage. May need daily dosing again.  - Off Farxiga  with hydradenitis.  - Continue spiro 12.5 mg  daily - Continue Losartan  12.5 mg daily.  - Overall stable from HF perspective. Continue close f/u. If deteriorates Donald need transplant eval - Labs today.    2. 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  - Underwent MVR with mechanical mitral valve in 12/22 at [facility].  - TEE 7/23 severe biventricular failure. + vegetation on mechanical MVR. -> Completed 6 weeks IV abx. No infectious symptoms - Coumadin  Clinic managing INRs. No bleeding - Valve stable on recent echo    3. Paroxysmal Atrial Fibrillation - Does not tolerate AF - NSR on ECG today - Continue amiodarone  200 mg daily - LFTs stable 2 weeks ago, TSH stable 8/25 - QTc 483 msec on ECG today  4. Autoimmune Disorder  Hydradenitis - He has seronegative spondylitis and pyoderma gangrenosum. - Following with Derm at [facility]. Now off prednisone  - No longer on secukinumab injections. Awaiting for insurance to approve Stellara - Off SGLT2i with higher risk of infections with hydradenitis.  - Next visit scheduled for February, last saw earlier this month.    5. 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 Cherrie Donald LITTIE Hayes, NP Advanced Heart Failure Team  06/10/2024  "

## 2024-06-21 MED ORDER — FUROSEMIDE 20 MG PO TABS: 20.0000 mg | ORAL_TABLET | Freq: Every day | ORAL | 3 refills | Status: AC

## 2024-06-21 NOTE — Telephone Encounter (Addendum)
 Pt aware, agreeable, and verbalized understanding  Med list updated   ----- Message from Beckey Coe, NP sent at 06/20/2024  4:52 PM EST ----- Please call.

## 2024-06-22 ENCOUNTER — Ambulatory Visit: Attending: Cardiology | Admitting: *Deleted

## 2024-06-22 DIAGNOSIS — I6349 Cerebral infarction due to embolism of other cerebral artery: Secondary | ICD-10-CM | POA: Insufficient documentation

## 2024-06-22 DIAGNOSIS — I480 Paroxysmal atrial fibrillation: Secondary | ICD-10-CM | POA: Insufficient documentation

## 2024-06-22 DIAGNOSIS — Z952 Presence of prosthetic heart valve: Secondary | ICD-10-CM | POA: Insufficient documentation

## 2024-06-22 DIAGNOSIS — Z5181 Encounter for therapeutic drug level monitoring: Secondary | ICD-10-CM | POA: Insufficient documentation

## 2024-06-22 LAB — POCT INR: INR: 2.8 (ref 2.0–3.0)

## 2024-06-22 NOTE — Progress Notes (Signed)
 INR-2.8 Please see anticoagulation encounter

## 2024-06-22 NOTE — Patient Instructions (Signed)
 Continue warfarin 1/2 tablet daily except 1 tablet on Fridays Recheck in 3 wk

## 2024-07-13 ENCOUNTER — Ambulatory Visit

## 2024-07-14 ENCOUNTER — Ambulatory Visit: Attending: Cardiology

## 2024-07-20 ENCOUNTER — Encounter (HOSPITAL_COMMUNITY): Payer: Self-pay

## 2024-07-20 NOTE — Nursing Note (Signed)
 Patient EKG completed by Paramedic Oneil. Patient tolerated well. Patient family at bedside. Call bell in reach.

## 2024-08-02 ENCOUNTER — Ambulatory Visit (HOSPITAL_COMMUNITY): Admitting: Internal Medicine

## 2024-08-03 ENCOUNTER — Ambulatory Visit (HOSPITAL_COMMUNITY): Payer: Self-pay | Admitting: Internal Medicine

## 2024-08-16 ENCOUNTER — Ambulatory Visit: Admitting: Cardiology

## 2025-03-02 ENCOUNTER — Ambulatory Visit: Admitting: Neurology
